# Patient Record
Sex: Female | Born: 1935
Health system: Southern US, Community
[De-identification: ages and names within clinical notes are randomized; demographics above are authoritative.]

## PROBLEM LIST (undated history)

## (undated) DIAGNOSIS — I5033 Acute on chronic diastolic (congestive) heart failure: Secondary | ICD-10-CM

## (undated) DIAGNOSIS — E785 Hyperlipidemia, unspecified: Secondary | ICD-10-CM

## (undated) DIAGNOSIS — M1990 Unspecified osteoarthritis, unspecified site: Secondary | ICD-10-CM

## (undated) DIAGNOSIS — G4700 Insomnia, unspecified: Secondary | ICD-10-CM

## (undated) DIAGNOSIS — I2510 Atherosclerotic heart disease of native coronary artery without angina pectoris: Secondary | ICD-10-CM

## (undated) DIAGNOSIS — I4891 Unspecified atrial fibrillation: Secondary | ICD-10-CM

## (undated) DIAGNOSIS — I10 Essential (primary) hypertension: Secondary | ICD-10-CM

## (undated) DIAGNOSIS — K219 Gastro-esophageal reflux disease without esophagitis: Secondary | ICD-10-CM

## (undated) DIAGNOSIS — K5792 Diverticulitis of intestine, part unspecified, without perforation or abscess without bleeding: Secondary | ICD-10-CM

## (undated) HISTORY — DX: Hyperlipidemia, unspecified: E78.5

## (undated) HISTORY — DX: Insomnia, unspecified: G47.00

## (undated) HISTORY — DX: Atherosclerotic heart disease of native coronary artery without angina pectoris: I25.10

## (undated) HISTORY — PX: ROTATOR CUFF REPAIR: SHX139

## (undated) HISTORY — DX: Diverticulitis of intestine, part unspecified, without perforation or abscess without bleeding: K57.92

## (undated) HISTORY — DX: Acute on chronic diastolic (congestive) heart failure: I50.33

## (undated) HISTORY — PX: JOINT REPLACEMENT: SHX530

---

## 1973-07-27 HISTORY — PX: ABDOMINAL HYSTERECTOMY: SHX81

## 1997-11-16 ENCOUNTER — Encounter: Admission: RE | Admit: 1997-11-16 | Discharge: 1997-11-16 | Payer: Self-pay | Admitting: Family Medicine

## 1997-11-20 ENCOUNTER — Encounter: Admission: RE | Admit: 1997-11-20 | Discharge: 1997-11-20 | Payer: Self-pay | Admitting: Family Medicine

## 1998-10-04 ENCOUNTER — Ambulatory Visit (HOSPITAL_COMMUNITY): Admission: RE | Admit: 1998-10-04 | Discharge: 1998-10-04 | Payer: Self-pay | Admitting: Family Medicine

## 1998-10-04 ENCOUNTER — Encounter: Admission: RE | Admit: 1998-10-04 | Discharge: 1998-10-04 | Payer: Self-pay | Admitting: Family Medicine

## 1998-11-01 ENCOUNTER — Encounter: Admission: RE | Admit: 1998-11-01 | Discharge: 1998-11-01 | Payer: Self-pay | Admitting: Family Medicine

## 1999-04-04 ENCOUNTER — Ambulatory Visit (HOSPITAL_BASED_OUTPATIENT_CLINIC_OR_DEPARTMENT_OTHER): Admission: RE | Admit: 1999-04-04 | Discharge: 1999-04-04 | Payer: Self-pay | Admitting: Orthopedic Surgery

## 1999-04-14 ENCOUNTER — Encounter: Admission: RE | Admit: 1999-04-14 | Discharge: 1999-07-13 | Payer: Self-pay | Admitting: Orthopedic Surgery

## 1999-09-08 ENCOUNTER — Encounter: Admission: RE | Admit: 1999-09-08 | Discharge: 1999-09-08 | Payer: Self-pay | Admitting: Family Medicine

## 1999-09-08 ENCOUNTER — Encounter: Payer: Self-pay | Admitting: Sports Medicine

## 1999-09-08 ENCOUNTER — Encounter: Admission: RE | Admit: 1999-09-08 | Discharge: 1999-09-08 | Payer: Self-pay | Admitting: Sports Medicine

## 1999-09-24 ENCOUNTER — Encounter: Admission: RE | Admit: 1999-09-24 | Discharge: 1999-09-24 | Payer: Self-pay | Admitting: Family Medicine

## 1999-09-26 ENCOUNTER — Encounter: Admission: RE | Admit: 1999-09-26 | Discharge: 1999-09-26 | Payer: Self-pay | Admitting: Family Medicine

## 1999-09-26 ENCOUNTER — Encounter: Payer: Self-pay | Admitting: Family Medicine

## 1999-10-10 ENCOUNTER — Encounter: Admission: RE | Admit: 1999-10-10 | Discharge: 1999-10-10 | Payer: Self-pay | Admitting: Family Medicine

## 2000-01-06 ENCOUNTER — Encounter: Admission: RE | Admit: 2000-01-06 | Discharge: 2000-01-06 | Payer: Self-pay | Admitting: Family Medicine

## 2000-03-03 ENCOUNTER — Encounter: Admission: RE | Admit: 2000-03-03 | Discharge: 2000-03-03 | Payer: Self-pay | Admitting: Family Medicine

## 2000-03-12 ENCOUNTER — Encounter: Admission: RE | Admit: 2000-03-12 | Discharge: 2000-03-12 | Payer: Self-pay | Admitting: Family Medicine

## 2000-03-16 ENCOUNTER — Encounter: Admission: RE | Admit: 2000-03-16 | Discharge: 2000-03-16 | Payer: Self-pay | Admitting: Sports Medicine

## 2000-04-12 ENCOUNTER — Encounter: Admission: RE | Admit: 2000-04-12 | Discharge: 2000-04-12 | Payer: Self-pay | Admitting: Family Medicine

## 2000-04-21 ENCOUNTER — Encounter: Admission: RE | Admit: 2000-04-21 | Discharge: 2000-04-21 | Payer: Self-pay | Admitting: Family Medicine

## 2000-04-21 ENCOUNTER — Encounter: Payer: Self-pay | Admitting: Family Medicine

## 2000-05-03 ENCOUNTER — Encounter: Payer: Self-pay | Admitting: Family Medicine

## 2000-05-03 ENCOUNTER — Ambulatory Visit (HOSPITAL_COMMUNITY): Admission: RE | Admit: 2000-05-03 | Discharge: 2000-05-03 | Payer: Self-pay | Admitting: Family Medicine

## 2000-05-03 ENCOUNTER — Encounter: Admission: RE | Admit: 2000-05-03 | Discharge: 2000-05-03 | Payer: Self-pay | Admitting: Family Medicine

## 2000-05-17 ENCOUNTER — Encounter: Admission: RE | Admit: 2000-05-17 | Discharge: 2000-05-17 | Payer: Self-pay | Admitting: Family Medicine

## 2000-06-20 ENCOUNTER — Emergency Department (HOSPITAL_COMMUNITY): Admission: EM | Admit: 2000-06-20 | Discharge: 2000-06-21 | Payer: Self-pay | Admitting: Emergency Medicine

## 2000-06-21 ENCOUNTER — Encounter: Payer: Self-pay | Admitting: Emergency Medicine

## 2000-06-25 ENCOUNTER — Encounter: Admission: RE | Admit: 2000-06-25 | Discharge: 2000-06-25 | Payer: Self-pay | Admitting: Family Medicine

## 2000-07-05 ENCOUNTER — Encounter: Admission: RE | Admit: 2000-07-05 | Discharge: 2000-07-05 | Payer: Self-pay | Admitting: Family Medicine

## 2000-07-23 ENCOUNTER — Encounter: Admission: RE | Admit: 2000-07-23 | Discharge: 2000-07-23 | Payer: Self-pay | Admitting: Family Medicine

## 2000-08-06 ENCOUNTER — Encounter: Admission: RE | Admit: 2000-08-06 | Discharge: 2000-08-06 | Payer: Self-pay | Admitting: Family Medicine

## 2000-08-20 ENCOUNTER — Encounter: Admission: RE | Admit: 2000-08-20 | Discharge: 2000-08-20 | Payer: Self-pay | Admitting: Family Medicine

## 2000-10-21 ENCOUNTER — Encounter: Admission: RE | Admit: 2000-10-21 | Discharge: 2000-10-21 | Payer: Self-pay | Admitting: Family Medicine

## 2000-10-29 ENCOUNTER — Encounter: Admission: RE | Admit: 2000-10-29 | Discharge: 2000-10-29 | Payer: Self-pay | Admitting: Family Medicine

## 2000-11-26 ENCOUNTER — Encounter: Admission: RE | Admit: 2000-11-26 | Discharge: 2000-11-26 | Payer: Self-pay | Admitting: Family Medicine

## 2000-12-03 ENCOUNTER — Encounter: Admission: RE | Admit: 2000-12-03 | Discharge: 2000-12-03 | Payer: Self-pay | Admitting: Family Medicine

## 2000-12-20 ENCOUNTER — Encounter: Admission: RE | Admit: 2000-12-20 | Discharge: 2000-12-20 | Payer: Self-pay | Admitting: Family Medicine

## 2001-02-28 ENCOUNTER — Encounter: Admission: RE | Admit: 2001-02-28 | Discharge: 2001-02-28 | Payer: Self-pay | Admitting: Family Medicine

## 2001-03-01 ENCOUNTER — Encounter: Payer: Self-pay | Admitting: Family Medicine

## 2001-03-01 ENCOUNTER — Encounter: Admission: RE | Admit: 2001-03-01 | Discharge: 2001-03-01 | Payer: Self-pay | Admitting: Family Medicine

## 2001-03-14 ENCOUNTER — Encounter: Admission: RE | Admit: 2001-03-14 | Discharge: 2001-03-14 | Payer: Self-pay | Admitting: Family Medicine

## 2001-04-01 ENCOUNTER — Encounter: Admission: RE | Admit: 2001-04-01 | Discharge: 2001-04-01 | Payer: Self-pay | Admitting: Family Medicine

## 2001-04-04 ENCOUNTER — Encounter: Payer: Self-pay | Admitting: Sports Medicine

## 2001-04-04 ENCOUNTER — Encounter: Admission: RE | Admit: 2001-04-04 | Discharge: 2001-04-04 | Payer: Self-pay | Admitting: Sports Medicine

## 2001-05-06 ENCOUNTER — Encounter: Admission: RE | Admit: 2001-05-06 | Discharge: 2001-05-06 | Payer: Self-pay | Admitting: Family Medicine

## 2001-05-11 ENCOUNTER — Encounter: Payer: Self-pay | Admitting: Family Medicine

## 2001-05-11 ENCOUNTER — Encounter: Admission: RE | Admit: 2001-05-11 | Discharge: 2001-05-11 | Payer: Self-pay | Admitting: Family Medicine

## 2001-09-23 ENCOUNTER — Encounter: Admission: RE | Admit: 2001-09-23 | Discharge: 2001-09-23 | Payer: Self-pay | Admitting: Family Medicine

## 2001-10-24 ENCOUNTER — Encounter: Admission: RE | Admit: 2001-10-24 | Discharge: 2001-10-24 | Payer: Self-pay | Admitting: Family Medicine

## 2001-10-24 ENCOUNTER — Encounter: Payer: Self-pay | Admitting: Family Medicine

## 2001-11-17 ENCOUNTER — Encounter: Admission: RE | Admit: 2001-11-17 | Discharge: 2001-11-17 | Payer: Self-pay | Admitting: Family Medicine

## 2001-12-05 ENCOUNTER — Encounter: Admission: RE | Admit: 2001-12-05 | Discharge: 2001-12-05 | Payer: Self-pay | Admitting: Family Medicine

## 2002-04-06 ENCOUNTER — Ambulatory Visit (HOSPITAL_BASED_OUTPATIENT_CLINIC_OR_DEPARTMENT_OTHER): Admission: RE | Admit: 2002-04-06 | Discharge: 2002-04-06 | Payer: Self-pay | Admitting: Orthopedic Surgery

## 2002-05-17 ENCOUNTER — Encounter: Admission: RE | Admit: 2002-05-17 | Discharge: 2002-05-17 | Payer: Self-pay | Admitting: Family Medicine

## 2002-07-24 ENCOUNTER — Encounter: Payer: Self-pay | Admitting: Orthopedic Surgery

## 2002-07-28 ENCOUNTER — Encounter: Payer: Self-pay | Admitting: Orthopedic Surgery

## 2002-07-28 ENCOUNTER — Inpatient Hospital Stay (HOSPITAL_COMMUNITY): Admission: RE | Admit: 2002-07-28 | Discharge: 2002-08-03 | Payer: Self-pay | Admitting: Orthopedic Surgery

## 2002-09-15 ENCOUNTER — Encounter: Admission: RE | Admit: 2002-09-15 | Discharge: 2002-09-15 | Payer: Self-pay | Admitting: Family Medicine

## 2002-10-16 ENCOUNTER — Encounter: Admission: RE | Admit: 2002-10-16 | Discharge: 2002-10-16 | Payer: Self-pay | Admitting: Family Medicine

## 2002-11-13 ENCOUNTER — Encounter: Admission: RE | Admit: 2002-11-13 | Discharge: 2002-11-13 | Payer: Self-pay | Admitting: Family Medicine

## 2002-12-02 ENCOUNTER — Encounter: Payer: Self-pay | Admitting: Emergency Medicine

## 2002-12-03 ENCOUNTER — Inpatient Hospital Stay (HOSPITAL_COMMUNITY): Admission: EM | Admit: 2002-12-03 | Discharge: 2002-12-06 | Payer: Self-pay | Admitting: Family Medicine

## 2002-12-04 ENCOUNTER — Encounter (INDEPENDENT_AMBULATORY_CARE_PROVIDER_SITE_OTHER): Payer: Self-pay | Admitting: Cardiology

## 2002-12-08 ENCOUNTER — Encounter: Admission: RE | Admit: 2002-12-08 | Discharge: 2002-12-08 | Payer: Self-pay | Admitting: Family Medicine

## 2002-12-15 ENCOUNTER — Encounter: Admission: RE | Admit: 2002-12-15 | Discharge: 2002-12-15 | Payer: Self-pay | Admitting: Family Medicine

## 2003-01-04 ENCOUNTER — Encounter: Payer: Self-pay | Admitting: Family Medicine

## 2003-01-04 ENCOUNTER — Encounter: Admission: RE | Admit: 2003-01-04 | Discharge: 2003-01-04 | Payer: Self-pay | Admitting: Family Medicine

## 2003-01-12 ENCOUNTER — Encounter: Admission: RE | Admit: 2003-01-12 | Discharge: 2003-01-12 | Payer: Self-pay | Admitting: Family Medicine

## 2003-01-19 ENCOUNTER — Encounter: Admission: RE | Admit: 2003-01-19 | Discharge: 2003-01-19 | Payer: Self-pay | Admitting: Family Medicine

## 2003-01-31 ENCOUNTER — Encounter: Admission: RE | Admit: 2003-01-31 | Discharge: 2003-01-31 | Payer: Self-pay | Admitting: Family Medicine

## 2003-02-05 ENCOUNTER — Encounter: Admission: RE | Admit: 2003-02-05 | Discharge: 2003-02-05 | Payer: Self-pay | Admitting: Family Medicine

## 2003-02-26 ENCOUNTER — Encounter: Admission: RE | Admit: 2003-02-26 | Discharge: 2003-02-26 | Payer: Self-pay | Admitting: Family Medicine

## 2003-03-12 ENCOUNTER — Encounter: Admission: RE | Admit: 2003-03-12 | Discharge: 2003-03-12 | Payer: Self-pay | Admitting: Family Medicine

## 2003-03-14 ENCOUNTER — Encounter: Payer: Self-pay | Admitting: Family Medicine

## 2003-03-14 ENCOUNTER — Encounter: Admission: RE | Admit: 2003-03-14 | Discharge: 2003-03-14 | Payer: Self-pay | Admitting: Family Medicine

## 2003-03-30 ENCOUNTER — Encounter: Payer: Self-pay | Admitting: Emergency Medicine

## 2003-03-30 ENCOUNTER — Inpatient Hospital Stay (HOSPITAL_COMMUNITY): Admission: AD | Admit: 2003-03-30 | Discharge: 2003-04-09 | Payer: Self-pay

## 2003-03-30 ENCOUNTER — Encounter: Payer: Self-pay | Admitting: Cardiology

## 2003-04-04 ENCOUNTER — Encounter: Payer: Self-pay | Admitting: Cardiothoracic Surgery

## 2003-04-05 ENCOUNTER — Encounter: Payer: Self-pay | Admitting: Cardiothoracic Surgery

## 2003-04-06 ENCOUNTER — Encounter: Payer: Self-pay | Admitting: Cardiothoracic Surgery

## 2003-04-07 ENCOUNTER — Encounter: Payer: Self-pay | Admitting: Cardiothoracic Surgery

## 2003-05-03 ENCOUNTER — Encounter: Admission: RE | Admit: 2003-05-03 | Discharge: 2003-05-03 | Payer: Self-pay | Admitting: Cardiothoracic Surgery

## 2003-05-03 ENCOUNTER — Encounter: Payer: Self-pay | Admitting: Cardiothoracic Surgery

## 2003-05-21 ENCOUNTER — Encounter (HOSPITAL_COMMUNITY): Admission: RE | Admit: 2003-05-21 | Discharge: 2003-08-19 | Payer: Self-pay

## 2003-05-22 ENCOUNTER — Encounter: Admission: RE | Admit: 2003-05-22 | Discharge: 2003-05-22 | Payer: Self-pay | Admitting: Sports Medicine

## 2003-06-25 ENCOUNTER — Encounter: Admission: RE | Admit: 2003-06-25 | Discharge: 2003-06-25 | Payer: Self-pay | Admitting: Family Medicine

## 2003-08-06 ENCOUNTER — Encounter: Admission: RE | Admit: 2003-08-06 | Discharge: 2003-08-06 | Payer: Self-pay | Admitting: Sports Medicine

## 2003-08-09 ENCOUNTER — Encounter: Admission: RE | Admit: 2003-08-09 | Discharge: 2003-08-09 | Payer: Self-pay | Admitting: Sports Medicine

## 2003-08-16 ENCOUNTER — Encounter: Admission: RE | Admit: 2003-08-16 | Discharge: 2003-08-16 | Payer: Self-pay | Admitting: Family Medicine

## 2004-06-02 ENCOUNTER — Encounter: Admission: RE | Admit: 2004-06-02 | Discharge: 2004-06-02 | Payer: Self-pay | Admitting: Cardiology

## 2004-07-07 ENCOUNTER — Ambulatory Visit: Payer: Self-pay | Admitting: Family Medicine

## 2004-07-27 HISTORY — PX: CORONARY ARTERY BYPASS GRAFT: SHX141

## 2004-09-10 ENCOUNTER — Ambulatory Visit (HOSPITAL_COMMUNITY): Admission: RE | Admit: 2004-09-10 | Discharge: 2004-09-10 | Payer: Self-pay | Admitting: Cardiology

## 2004-09-15 ENCOUNTER — Ambulatory Visit: Payer: Self-pay | Admitting: Family Medicine

## 2004-10-20 ENCOUNTER — Encounter: Admission: RE | Admit: 2004-10-20 | Discharge: 2004-10-20 | Payer: Self-pay | Admitting: Family Medicine

## 2004-12-29 ENCOUNTER — Ambulatory Visit: Payer: Self-pay | Admitting: Family Medicine

## 2005-02-16 ENCOUNTER — Ambulatory Visit: Payer: Self-pay | Admitting: Family Medicine

## 2005-05-06 ENCOUNTER — Ambulatory Visit: Payer: Self-pay | Admitting: Family Medicine

## 2005-05-07 ENCOUNTER — Ambulatory Visit: Payer: Self-pay | Admitting: Family Medicine

## 2005-05-29 ENCOUNTER — Inpatient Hospital Stay (HOSPITAL_COMMUNITY): Admission: RE | Admit: 2005-05-29 | Discharge: 2005-06-03 | Payer: Self-pay | Admitting: Orthopedic Surgery

## 2005-06-06 ENCOUNTER — Inpatient Hospital Stay (HOSPITAL_COMMUNITY): Admission: EM | Admit: 2005-06-06 | Discharge: 2005-06-10 | Payer: Self-pay | Admitting: Emergency Medicine

## 2005-06-29 ENCOUNTER — Ambulatory Visit: Payer: Self-pay | Admitting: Family Medicine

## 2005-06-29 ENCOUNTER — Inpatient Hospital Stay (HOSPITAL_COMMUNITY): Admission: AD | Admit: 2005-06-29 | Discharge: 2005-07-06 | Payer: Self-pay | Admitting: Orthopedic Surgery

## 2005-08-21 ENCOUNTER — Ambulatory Visit: Payer: Self-pay | Admitting: Family Medicine

## 2005-10-21 ENCOUNTER — Encounter: Admission: RE | Admit: 2005-10-21 | Discharge: 2005-10-21 | Payer: Self-pay | Admitting: Family Medicine

## 2006-01-22 ENCOUNTER — Ambulatory Visit: Payer: Self-pay | Admitting: Family Medicine

## 2006-03-11 ENCOUNTER — Ambulatory Visit: Payer: Self-pay | Admitting: Internal Medicine

## 2006-03-18 ENCOUNTER — Ambulatory Visit: Payer: Self-pay | Admitting: Internal Medicine

## 2006-05-19 ENCOUNTER — Ambulatory Visit: Payer: Self-pay | Admitting: Sports Medicine

## 2006-07-02 ENCOUNTER — Ambulatory Visit: Payer: Self-pay | Admitting: Family Medicine

## 2006-07-16 ENCOUNTER — Ambulatory Visit: Payer: Self-pay | Admitting: Family Medicine

## 2006-07-16 ENCOUNTER — Encounter: Admission: RE | Admit: 2006-07-16 | Discharge: 2006-07-16 | Payer: Self-pay | Admitting: Family Medicine

## 2006-09-23 DIAGNOSIS — K5730 Diverticulosis of large intestine without perforation or abscess without bleeding: Secondary | ICD-10-CM | POA: Insufficient documentation

## 2006-09-23 DIAGNOSIS — K210 Gastro-esophageal reflux disease with esophagitis: Secondary | ICD-10-CM

## 2006-09-23 DIAGNOSIS — K279 Peptic ulcer, site unspecified, unspecified as acute or chronic, without hemorrhage or perforation: Secondary | ICD-10-CM | POA: Insufficient documentation

## 2006-09-23 DIAGNOSIS — K219 Gastro-esophageal reflux disease without esophagitis: Secondary | ICD-10-CM | POA: Diagnosis present

## 2006-09-23 DIAGNOSIS — M545 Low back pain, unspecified: Secondary | ICD-10-CM | POA: Insufficient documentation

## 2006-09-23 DIAGNOSIS — I43 Cardiomyopathy in diseases classified elsewhere: Secondary | ICD-10-CM

## 2006-09-23 DIAGNOSIS — D649 Anemia, unspecified: Secondary | ICD-10-CM | POA: Insufficient documentation

## 2006-09-23 DIAGNOSIS — E785 Hyperlipidemia, unspecified: Secondary | ICD-10-CM

## 2006-09-23 DIAGNOSIS — J309 Allergic rhinitis, unspecified: Secondary | ICD-10-CM | POA: Insufficient documentation

## 2006-09-23 DIAGNOSIS — I119 Hypertensive heart disease without heart failure: Secondary | ICD-10-CM

## 2006-09-23 DIAGNOSIS — G4700 Insomnia, unspecified: Secondary | ICD-10-CM

## 2006-10-26 ENCOUNTER — Encounter: Admission: RE | Admit: 2006-10-26 | Discharge: 2006-10-26 | Payer: Self-pay | Admitting: Family Medicine

## 2006-10-27 ENCOUNTER — Encounter: Payer: Self-pay | Admitting: Family Medicine

## 2007-01-20 ENCOUNTER — Telehealth: Payer: Self-pay | Admitting: *Deleted

## 2007-02-18 ENCOUNTER — Ambulatory Visit: Payer: Self-pay | Admitting: Family Medicine

## 2007-02-18 DIAGNOSIS — R5381 Other malaise: Secondary | ICD-10-CM

## 2007-02-18 DIAGNOSIS — R5383 Other fatigue: Secondary | ICD-10-CM

## 2007-02-18 LAB — CONVERTED CEMR LAB
ALT: 15 units/L (ref 0–35)
AST: 19 units/L (ref 0–37)
Albumin: 4.3 g/dL (ref 3.5–5.2)
Alkaline Phosphatase: 80 units/L (ref 39–117)
BUN: 31 mg/dL — ABNORMAL HIGH (ref 6–23)
CO2: 24 meq/L (ref 19–32)
Calcium: 9 mg/dL (ref 8.4–10.5)
Chloride: 105 meq/L (ref 96–112)
Cholesterol: 181 mg/dL (ref 0–200)
Creatinine, Ser: 1.28 mg/dL — ABNORMAL HIGH (ref 0.40–1.20)
Glucose, Bld: 92 mg/dL (ref 70–99)
HCT: 34.5 % — ABNORMAL LOW (ref 36.0–46.0)
HDL: 41 mg/dL (ref >39)
Hemoglobin: 11 g/dL — ABNORMAL LOW (ref 12.0–15.0)
LDL Cholesterol: 79 mg/dL (ref 0–99)
MCHC: 31.9 g/dL (ref 30.0–36.0)
MCV: 93.5 fL (ref 78.0–100.0)
Potassium: 4.4 meq/L (ref 3.5–5.3)
RBC: 3.69 M/uL — ABNORMAL LOW (ref 3.87–5.11)
Sodium: 141 meq/L (ref 135–145)
Total Bilirubin: 0.3 mg/dL (ref 0.3–1.2)
Total CHOL/HDL Ratio: 4.4
Total Protein: 7 g/dL (ref 6.0–8.3)
Triglycerides: 305 mg/dL — ABNORMAL HIGH (ref <150)
VLDL: 61 mg/dL — ABNORMAL HIGH (ref 0–40)
WBC: 4.1 10*3/uL (ref 4.0–10.5)

## 2007-02-25 ENCOUNTER — Telehealth: Payer: Self-pay | Admitting: Family Medicine

## 2007-03-10 ENCOUNTER — Telehealth: Payer: Self-pay | Admitting: *Deleted

## 2007-03-11 ENCOUNTER — Encounter (INDEPENDENT_AMBULATORY_CARE_PROVIDER_SITE_OTHER): Payer: Self-pay | Admitting: *Deleted

## 2007-05-05 ENCOUNTER — Ambulatory Visit: Payer: Self-pay | Admitting: Family Medicine

## 2007-06-20 ENCOUNTER — Telehealth: Payer: Self-pay | Admitting: Family Medicine

## 2007-07-11 ENCOUNTER — Ambulatory Visit: Payer: Self-pay | Admitting: Family Medicine

## 2007-07-12 ENCOUNTER — Ambulatory Visit: Payer: Self-pay | Admitting: Family Medicine

## 2007-07-12 ENCOUNTER — Encounter: Payer: Self-pay | Admitting: Family Medicine

## 2007-07-12 LAB — CONVERTED CEMR LAB
ALT: 12 units/L (ref 0–35)
AST: 20 units/L (ref 0–37)
Albumin: 4.1 g/dL (ref 3.5–5.2)
Alkaline Phosphatase: 77 units/L (ref 39–117)
BUN: 17 mg/dL (ref 6–23)
Calcium: 9 mg/dL (ref 8.4–10.5)
Chloride: 108 meq/L (ref 96–112)
Creatinine, Ser: 1.17 mg/dL (ref 0.40–1.20)
HCT: 33.1 % — ABNORMAL LOW (ref 36.0–46.0)
Hemoglobin: 10.3 g/dL — ABNORMAL LOW (ref 12.0–15.0)
MCV: 94.3 fL (ref 78.0–100.0)
Platelets: 241 10*3/uL (ref 150–400)
Potassium: 3.7 meq/L (ref 3.5–5.3)
RDW: 14.3 % (ref 11.5–15.5)
TSH: 2.675 microintl units/mL (ref 0.350–5.50)
Total Protein: 6.7 g/dL (ref 6.0–8.3)
WBC: 3.7 10*3/uL — ABNORMAL LOW (ref 4.0–10.5)

## 2007-07-19 ENCOUNTER — Telehealth: Payer: Self-pay | Admitting: Family Medicine

## 2007-07-25 ENCOUNTER — Encounter: Admission: RE | Admit: 2007-07-25 | Discharge: 2007-07-25 | Payer: Self-pay | Admitting: Family Medicine

## 2007-07-25 ENCOUNTER — Ambulatory Visit: Payer: Self-pay | Admitting: Family Medicine

## 2007-07-26 ENCOUNTER — Telehealth: Payer: Self-pay | Admitting: Family Medicine

## 2007-08-12 ENCOUNTER — Ambulatory Visit: Payer: Self-pay | Admitting: Family Medicine

## 2007-10-05 ENCOUNTER — Encounter: Payer: Self-pay | Admitting: Family Medicine

## 2007-10-27 ENCOUNTER — Encounter: Payer: Self-pay | Admitting: Family Medicine

## 2007-12-22 ENCOUNTER — Encounter: Admission: RE | Admit: 2007-12-22 | Discharge: 2007-12-22 | Payer: Self-pay | Admitting: Family Medicine

## 2007-12-30 ENCOUNTER — Ambulatory Visit: Payer: Self-pay | Admitting: Family Medicine

## 2008-02-17 ENCOUNTER — Encounter: Payer: Self-pay | Admitting: Family Medicine

## 2008-02-21 ENCOUNTER — Ambulatory Visit: Payer: Self-pay | Admitting: Family Medicine

## 2008-02-21 ENCOUNTER — Encounter: Payer: Self-pay | Admitting: Family Medicine

## 2008-02-21 LAB — CONVERTED CEMR LAB
HDL: 42 mg/dL (ref >39)
LDL Cholesterol: 115 mg/dL — ABNORMAL HIGH (ref 0–99)
Total CHOL/HDL Ratio: 5
VLDL: 55 mg/dL — ABNORMAL HIGH (ref 0–40)

## 2008-02-22 ENCOUNTER — Telehealth: Payer: Self-pay | Admitting: Family Medicine

## 2008-03-26 ENCOUNTER — Encounter: Payer: Self-pay | Admitting: Family Medicine

## 2008-04-20 ENCOUNTER — Ambulatory Visit: Payer: Self-pay | Admitting: Family Medicine

## 2008-10-03 ENCOUNTER — Ambulatory Visit: Payer: Self-pay | Admitting: Family Medicine

## 2008-10-03 ENCOUNTER — Encounter: Admission: RE | Admit: 2008-10-03 | Discharge: 2008-10-03 | Payer: Self-pay | Admitting: Family Medicine

## 2008-10-03 LAB — CONVERTED CEMR LAB
ALT: 16 units/L (ref 0–35)
AST: 23 units/L (ref 0–37)
Alkaline Phosphatase: 73 units/L (ref 39–117)
BUN: 24 mg/dL — ABNORMAL HIGH (ref 6–23)
Creatinine, Ser: 1.2 mg/dL (ref 0.40–1.20)
HCT: 34.3 % — ABNORMAL LOW (ref 36.0–46.0)
Hemoglobin: 11.2 g/dL — ABNORMAL LOW (ref 12.0–15.0)
MCHC: 32.7 g/dL (ref 30.0–36.0)
Platelets: 217 10*3/uL (ref 150–400)
RDW: 13.5 % (ref 11.5–15.5)

## 2008-11-05 ENCOUNTER — Telehealth: Payer: Self-pay | Admitting: Family Medicine

## 2008-11-05 ENCOUNTER — Telehealth: Payer: Self-pay | Admitting: *Deleted

## 2008-11-28 ENCOUNTER — Encounter: Payer: Self-pay | Admitting: Family Medicine

## 2008-12-26 ENCOUNTER — Encounter: Payer: Self-pay | Admitting: Family Medicine

## 2009-01-10 ENCOUNTER — Encounter: Admission: RE | Admit: 2009-01-10 | Discharge: 2009-01-10 | Payer: Self-pay | Admitting: Family Medicine

## 2009-01-21 ENCOUNTER — Telehealth (INDEPENDENT_AMBULATORY_CARE_PROVIDER_SITE_OTHER): Payer: Self-pay | Admitting: *Deleted

## 2009-01-23 ENCOUNTER — Encounter: Payer: Self-pay | Admitting: Family Medicine

## 2009-02-07 ENCOUNTER — Encounter: Payer: Self-pay | Admitting: Family Medicine

## 2009-02-08 ENCOUNTER — Ambulatory Visit: Payer: Self-pay | Admitting: Family Medicine

## 2009-02-14 ENCOUNTER — Encounter: Admission: RE | Admit: 2009-02-14 | Discharge: 2009-02-14 | Payer: Self-pay | Admitting: Family Medicine

## 2009-02-21 ENCOUNTER — Ambulatory Visit: Payer: Self-pay | Admitting: Family Medicine

## 2009-02-21 ENCOUNTER — Encounter: Payer: Self-pay | Admitting: Family Medicine

## 2009-02-25 LAB — CONVERTED CEMR LAB
AST: 20 units/L (ref 0–37)
Alkaline Phosphatase: 65 units/L (ref 39–117)
BUN: 27 mg/dL — ABNORMAL HIGH (ref 6–23)
Calcium: 9.5 mg/dL (ref 8.4–10.5)
Creatinine, Ser: 1.22 mg/dL — ABNORMAL HIGH (ref 0.40–1.20)
HCT: 34.9 % — ABNORMAL LOW (ref 36.0–46.0)
HDL: 42 mg/dL (ref >39)
Hemoglobin: 11.3 g/dL — ABNORMAL LOW (ref 12.0–15.0)
MCHC: 32.4 g/dL (ref 30.0–36.0)
MCV: 94.6 fL (ref 78.0–100.0)
RDW: 14.4 % (ref 11.5–15.5)
TSH: 4.419 microintl units/mL (ref 0.350–4.500)
Total Bilirubin: 0.4 mg/dL (ref 0.3–1.2)
Total CHOL/HDL Ratio: 4.4
VLDL: 55 mg/dL — ABNORMAL HIGH (ref 0–40)

## 2009-04-08 ENCOUNTER — Telehealth: Payer: Self-pay | Admitting: Family Medicine

## 2009-04-08 ENCOUNTER — Ambulatory Visit: Payer: Self-pay | Admitting: Family Medicine

## 2009-04-08 DIAGNOSIS — R42 Dizziness and giddiness: Secondary | ICD-10-CM | POA: Insufficient documentation

## 2009-05-02 ENCOUNTER — Ambulatory Visit: Payer: Self-pay | Admitting: Family Medicine

## 2009-10-08 ENCOUNTER — Encounter: Payer: Self-pay | Admitting: Family Medicine

## 2009-12-25 ENCOUNTER — Encounter: Payer: Self-pay | Admitting: Family Medicine

## 2010-01-08 ENCOUNTER — Telehealth: Payer: Self-pay | Admitting: Family Medicine

## 2010-01-14 ENCOUNTER — Encounter: Admission: RE | Admit: 2010-01-14 | Discharge: 2010-01-14 | Payer: Self-pay | Admitting: Family Medicine

## 2010-01-23 ENCOUNTER — Encounter: Payer: Self-pay | Admitting: Family Medicine

## 2010-02-12 ENCOUNTER — Ambulatory Visit: Payer: Self-pay | Admitting: Family Medicine

## 2010-02-24 ENCOUNTER — Ambulatory Visit: Payer: Self-pay | Admitting: Family Medicine

## 2010-02-24 LAB — CONVERTED CEMR LAB
AST: 18 units/L (ref 0–37)
Albumin: 4.2 g/dL (ref 3.5–5.2)
Alkaline Phosphatase: 73 units/L (ref 39–117)
BUN: 29 mg/dL — ABNORMAL HIGH (ref 6–23)
Creatinine, Ser: 1.53 mg/dL — ABNORMAL HIGH (ref 0.40–1.20)
HDL: 39 mg/dL — ABNORMAL LOW (ref >39)
Hemoglobin: 10.8 g/dL — ABNORMAL LOW (ref 12.0–15.0)
LDL Cholesterol: 79 mg/dL (ref 0–99)
MCHC: 31.5 g/dL (ref 30.0–36.0)
Potassium: 4.2 meq/L (ref 3.5–5.3)
RDW: 14.4 % (ref 11.5–15.5)
TSH: 3.672 microintl units/mL (ref 0.350–4.500)
Total CHOL/HDL Ratio: 4.3

## 2010-04-08 ENCOUNTER — Encounter: Payer: Self-pay | Admitting: Family Medicine

## 2010-04-22 ENCOUNTER — Telehealth: Payer: Self-pay | Admitting: Family Medicine

## 2010-04-23 ENCOUNTER — Ambulatory Visit: Payer: Self-pay | Admitting: Family Medicine

## 2010-04-23 DIAGNOSIS — L723 Sebaceous cyst: Secondary | ICD-10-CM

## 2010-08-26 NOTE — Miscellaneous (Signed)
Summary: hydrocodone/APAP refill  Clinical Lists Changes  Medications: Changed medication from HYDROCODONE-ACETAMINOPHEN 5-325 MG  TABS (HYDROCODONE-ACETAMINOPHEN) one by mouth three times a day to HYDROCODONE-ACETAMINOPHEN 5-325 MG  TABS (HYDROCODONE-ACETAMINOPHEN) one by mouth three times a day - Signed Rx of HYDROCODONE-ACETAMINOPHEN 5-325 MG  TABS (HYDROCODONE-ACETAMINOPHEN) one by mouth three times a day;  #90 x 5;  Signed;  Entered by: Madison Hickman MD;  Authorized by: Madison Hickman MD;  Method used: Handwritten    Prescriptions: HYDROCODONE-ACETAMINOPHEN 5-325 MG  TABS (HYDROCODONE-ACETAMINOPHEN) one by mouth three times a day  #90 x 5   Entered and Authorized by:   Madison Hickman MD   Signed by:   Madison Hickman MD on 03/26/2008   Method used:   Handwritten   [id number]

## 2010-08-26 NOTE — Assessment & Plan Note (Signed)
Summary: knot on rib/[place]   Vital Signs:  Patient profile:   77 year old female Weight:      153 pounds Temp:     98.4 degrees F oral Pulse rate:   60 / minute Pulse rhythm:   regular BP sitting:   127 / 73  (right arm) Cuff size:   regular  Vitals Entered By: Audelia Hives CMA (April 23, 2010 2:33 PM) CC: knot under    Primary Care Provider:  Madison Hickman MD  CC:  knot under .  History of Present Illness: Painful red swelling just under brastrap Right lateral thorax  Habits & Providers  Alcohol-Tobacco-Diet     Alcohol drinks/day: <1     Tobacco Status: never  Current Medications (verified): 1)  Alprazolam 0.5 Mg Tabs (Alprazolam) .... Take 1 Tablet By Mouth Three Times A Day 2)  Bayer Childrens Aspirin 81 Mg Chew (Aspirin) .... Take 1 Tablet By Mouth Once A Day 3)  Hydrochlorothiazide 12.5 Mg Tabs (Hydrochlorothiazide) .... Take 1 Tablet By Mouth Once A Day 4)  Metoprolol Tartrate 25 Mg Tabs (Metoprolol Tartrate) .... Take 1 Tablet By Mouth Twice A Day 5)  Omeprazole 20 Mg Cpdr (Omeprazole) .... Take 1 Capsule By Mouth Once A Day 6)  Sm Calcium/vitamin D 500-200 Mg-Unit Tabs (Calcium Carbonate-Vitamin D) .... Take 1 Tablet By Mouth Three Times A Day 7)  Enalapril Maleate 10 Mg  Tabs (Enalapril Maleate) .... One Tab Twice Daily 8)  Simvastatin 80 Mg Tabs (Simvastatin) .Marland Kitchen.. 1 By Mouth At Bedtime 9)  Requip 0.25 Mg Tabs (Ropinirole Hcl) .... One By Mouth Three Times A Day 10)  Ibuprofen 600 Mg  Tabs (Ibuprofen) .... One By Mouth Three Times A Day 11)  Fish Oil 1000 Mg Caps (Omega-3 Fatty Acids) .... One By Mouth 4 Times Daily Per Cards 12)  Loratadine 10 Mg  Tabs (Loratadine) .... Once Daily As Needed Allergies 13)  Doxycycline Hyclate 100 Mg Caps (Doxycycline Hyclate) .... Take 1 Tab Twice A Day  Allergies (verified): 1)  Lipitor (Atorvastatin Calcium)  Past History:  Past medical, surgical, family and social histories (including risk factors) reviewed,  and no changes noted (except as noted below).  Past Medical History: Reviewed history from 09/23/2006 and no changes required. dry eyes seeing opthalmologist, gynodiol 1 mg bid per Dr. Ree Edman, stool softener  Past Surgical History: Reviewed history from 12/30/2007 and no changes required. bone density 04/99 T=-0.80 -, CABG - 04/11/2003, Cardiac Cath - 12/08/2002, Echo with nl EF - 12/08/2002, hemorhoidectomy -, Hysterectomy -, Lt total knee replacement - 07/27/2002, nl gated stress test - 05/27/2006, PCTA - 12/08/2002, Rt rotator cuff surg -, Stress perfusion EF77%, - 01/13/2006  Lt rotator cuff surg April 09  Family History: Reviewed history from 09/23/2006 and no changes required. , -Ca, HBP, DM, CAD  Social History: Reviewed history from 02/12/2010 and no changes required. non smoker; non drinker; husband: died 22-Oct-2022;  Physical Exam  Skin:  Infected sebaceous cyst.  I was able to open and gently express purulent sebum.   Impression & Recommendations:  Problem # 1:  SEBACEOUS CYST, INFECTED (ICD-706.2)  Draining now.  Use warm compresses.  Short course of doxy.  Orders: [facility]- Est Level  3 [id number])  Complete Medication List: 1)  Alprazolam 0.5 Mg Tabs (Alprazolam) .... Take 1 tablet by mouth three times a day 2)  Bayer Childrens Aspirin 81 Mg Chew (Aspirin) .... Take 1 tablet by mouth once a day 3)  Hydrochlorothiazide 12.5 Mg  Tabs (Hydrochlorothiazide) .... Take 1 tablet by mouth once a day 4)  Metoprolol Tartrate 25 Mg Tabs (Metoprolol tartrate) .... Take 1 tablet by mouth twice a day 5)  Omeprazole 20 Mg Cpdr (Omeprazole) .... Take 1 capsule by mouth once a day 6)  Sm Calcium/vitamin D 500-200 Mg-unit Tabs (Calcium carbonate-vitamin d) .... Take 1 tablet by mouth three times a day 7)  Enalapril Maleate 10 Mg Tabs (Enalapril maleate) .... One tab twice daily 8)  Simvastatin 80 Mg Tabs (Simvastatin) .Marland Kitchen.. 1 by mouth at bedtime 9)  Requip 0.25 Mg Tabs (Ropinirole hcl) .... One by mouth  three times a day 10)  Ibuprofen 600 Mg Tabs (Ibuprofen) .... One by mouth three times a day 11)  Fish Oil 1000 Mg Caps (Omega-3 fatty acids) .... One by mouth 4 times daily per cards 12)  Loratadine 10 Mg Tabs (Loratadine) .... Once daily as needed allergies 13)  Doxycycline Hyclate 100 Mg Caps (Doxycycline hyclate) .... Take 1 tab twice a day  Other Orders: Influenza Vaccine MCR [id number]) Prescriptions: DOXYCYCLINE HYCLATE 100 MG CAPS (DOXYCYCLINE HYCLATE) Take 1 tab twice a day  #10 x 0   Entered and Authorized by:   Madison Hickman MD   Signed by:   Madison Hickman MD on 04/23/2010   Method used:   [facility] [id number]       [address]       [place], [place]  [address]       Ph: [phone]       Fax: [phone]   RxID:   [id number] LORATADINE 10 MG  TABS (LORATADINE) once daily as needed allergies  #30 x 12   Entered and Authorized by:   Madison Hickman MD   Signed by:   Madison Hickman MD on 04/23/2010   Method used:   [facility]        [facility] [id number]       [address]       [place], [place]   [address]       Ph: [phone]       Fax: [phone]   RxID:   [id number]    Immunizations Administered:  Influenza Vaccine # 1:    Vaccine Type: Fluvax MCR    Site: right deltoid    Mfr: GlaxoSmithKline    Dose: 0.5 ml    Route: IM    Given by: Audelia Hives CMA    Exp. Date: 01/21/2011    Lot #: [id number]    VIS given: 02/18/10 version given April 23, 2010.  Flu Vaccine Consent Questions:    Do you have a history of severe allergic reactions to this vaccine? no    Any prior history of allergic reactions to egg and/or gelatin? no    Do you have a sensitivity to the preservative Thimersol? no    Do you have a past history of Guillan-Barre Syndrome? no    Do you currently have an acute febrile illness? no    Have you ever had a severe reaction  to latex? no    Vaccine information given and explained to patient? yes    Are you currently pregnant? no    Prevention & Chronic Care Immunizations   Influenza vaccine: Fluvax MCR  (04/23/2010)   Influenza vaccine due: 03/28/2011  Tetanus booster: 12/30/2007: given   Tetanus booster due: 12/29/2017    Pneumococcal vaccine: Done.  (10/25/1997)   Pneumococcal vaccine deferral: Not indicated  (02/12/2010)   Pneumococcal vaccine due: None    H. zoster vaccine: Not documented  Colorectal Screening   Hemoccult: Done.  (07/28/2003)   Hemoccult due: Not Indicated    Colonoscopy: Done.  (02/24/2006)   Colonoscopy due: 02/25/2016  Other Screening   Pap smear: Not documented   Pap smear due: Not Indicated    Mammogram: ASSESSMENT: Negative - BI-RADS 1^MM DIGITAL SCREENING  (01/14/2010)   Mammogram due: 01/15/2011    DXA bone density scan: Lumbar Spine:  T Score > -1.0 Spine.  Hip Total: T Score -2.5 to -1.0 Hip.      (02/13/2009)   DXA scan due: None    Smoking status: never  (04/23/2010)  Lipids   Total Cholesterol: 166  (02/24/2010)   Lipid panel action/deferral: Lipid Panel ordered   LDL: 79  (02/24/2010)   LDL Direct: Not documented   HDL: 39  (02/24/2010)   Triglycerides: 239  (02/24/2010)    SGOT (AST): 18  (02/24/2010)   SGPT (ALT): 14  (02/24/2010)   Alkaline phosphatase: 73  (02/24/2010)   Total bilirubin: 0.4  (02/24/2010)    Lipid flowsheet reviewed?: Yes   Progress toward LDL goal: At goal  Hypertension   Last Blood Pressure: 127 / 73  (04/23/2010)   Serum creatinine: 1.53  (02/24/2010)   Serum potassium 4.2  (02/24/2010)    Hypertension flowsheet reviewed?: Yes   Progress toward BP goal: At goal  Self-Management Support :   Personal Goals (by the next clinic visit) :      Personal blood pressure goal: 140/90  (02/12/2010)     Personal LDL goal: 100  (02/12/2010)    Hypertension self-management support: Written self-care plan   (02/12/2010)    Lipid self-management support: Written self-care plan  (02/12/2010)

## 2010-08-26 NOTE — Progress Notes (Signed)
Summary: triage  Phone Note Call from Patient Call back at Home Phone [phone]   Caller: Patient Summary of Call: Found a knot under breast like on a rib.  Would like to have it looked at today. Initial call taken by: Raymond Gurney,  April 22, 2010 9:22 AM  Follow-up for Phone Call        states she found it this am. her rib had been sore previously. knot is new. it is sore. she is quite worried. will see pcp tomorrow at 2:15 Follow-up by: Elige Radon RN,  April 22, 2010 9:26 AM  Additional Follow-up for Phone Call Additional follow up Details #1::        noted and agree Additional Follow-up by: Madison Hickman MD,  April 22, 2010 9:31 AM

## 2010-08-26 NOTE — Miscellaneous (Signed)
Summary: Alprazolam refill  Clinical Lists Changes Refilled via fax request by pharmacy Medications: Changed medication from ALPRAZOLAM 0.5 MG TABS (ALPRAZOLAM) Take 1 tablet by mouth three times a day to ALPRAZOLAM 0.5 MG TABS (ALPRAZOLAM) Take 1 tablet by mouth three times a day - Signed Rx of ALPRAZOLAM 0.5 MG TABS (ALPRAZOLAM) Take 1 tablet by mouth three times a day;  #90 x 5;  Signed;  Entered by: Madison Hickman MD;  Authorized by: Madison Hickman MD;  Method used: Handwritten    Prescriptions: ALPRAZOLAM 0.5 MG TABS (ALPRAZOLAM) Take 1 tablet by mouth three times a day  #90 x 5   Entered and Authorized by:   Madison Hickman MD   Signed by:   Madison Hickman MD on 10/08/2009   Method used:   Handwritten   RxID:   [id number]

## 2010-08-26 NOTE — Miscellaneous (Signed)
Summary: med refill by fax request  Clinical Lists Changes  Medications: Rx of ALPRAZOLAM 0.5 MG TABS (ALPRAZOLAM) Take 1 tablet by mouth three times a day;  #90 x 5;  Signed;  Entered by: Madison Hickman MD;  Authorized by: Madison Hickman MD;  Method used: Handwritten    Prescriptions: ALPRAZOLAM 0.5 MG TABS (ALPRAZOLAM) Take 1 tablet by mouth three times a day  #90 x 5   Entered and Authorized by:   Madison Hickman MD   Signed by:   Madison Hickman MD on 04/08/2010   Method used:   Handwritten   RxID:   [id number]

## 2010-08-26 NOTE — Assessment & Plan Note (Signed)
Summary: cpe,df   Vital Signs:  Patient profile:   77 year old female Height:      62 inches Weight:      155.7 pounds BMI:     28.58 Temp:     98.2 degrees F Pulse rate:   66 / minute BP sitting:   150 / 82  Primary Care Provider:  Madison Hickman MD  CC:  FU mult problems.  History of Present Illness: C/O insomnia Not taking hydrocodone, prefers OTC ibuprofen.  Discussed ways to decrease risk of side effects.   Discussed 80 mg simvastatin dose.  No myalgias.  Tolerating well . Will leave her on same dose. BP high today.  Was also high at cardiologist At 24, not much screening indicated  Habits & Providers  Alcohol-Tobacco-Diet     Alcohol drinks/day: <1     Tobacco Status: never  Exercise-Depression-Behavior     Have you felt down or hopeless? no     Have you felt little pleasure in things? no     STD Risk: never  Current Medications (verified): 1)  Alprazolam 0.5 Mg Tabs (Alprazolam) .... Take 1 Tablet By Mouth Three Times A Day 2)  Bayer Childrens Aspirin 81 Mg Chew (Aspirin) .... Take 1 Tablet By Mouth Once A Day 3)  Hydrochlorothiazide 12.5 Mg Tabs (Hydrochlorothiazide) .... Take 1 Tablet By Mouth Once A Day 4)  Metoprolol Tartrate 25 Mg Tabs (Metoprolol Tartrate) .... Take 1 Tablet By Mouth Twice A Day 5)  Omeprazole 20 Mg Cpdr (Omeprazole) .... Take 1 Capsule By Mouth Once A Day 6)  Sm Calcium/vitamin D 500-200 Mg-Unit Tabs (Calcium Carbonate-Vitamin D) .... Take 1 Tablet By Mouth Three Times A Day 7)  Enalapril Maleate 10 Mg  Tabs (Enalapril Maleate) .... One Tab Twice Daily 8)  Simvastatin 80 Mg Tabs (Simvastatin) .Marland Kitchen.. 1 By Mouth At Bedtime 9)  Requip 0.25 Mg Tabs (Ropinirole Hcl) .... One By Mouth Three Times A Day 10)  Ibuprofen 600 Mg  Tabs (Ibuprofen) .... One By Mouth Three Times A Day 11)  Fish Oil 1000 Mg Caps (Omega-3 Fatty Acids) .... One By Mouth 4 Times Daily Per Cards  Allergies (verified): 1)  Lipitor (Atorvastatin Calcium)  Past  History:  Past medical, surgical, family and social histories (including risk factors) reviewed, and no changes noted (except as noted below).  Past Medical History: Reviewed history from 09/23/2006 and no changes required. dry eyes seeing opthalmologist, gynodiol 1 mg bid per Dr. Ree Edman, stool softener  Past Surgical History: Reviewed history from 12/30/2007 and no changes required. bone density 04/99 T=-0.80 -, CABG - 04/11/2003, Cardiac Cath - 12/08/2002, Echo with nl EF - 12/08/2002, hemorhoidectomy -, Hysterectomy -, Lt total knee replacement - 07/27/2002, nl gated stress test - 05/27/2006, PCTA - 12/08/2002, Rt rotator cuff surg -, Stress perfusion EF77%, - 01/13/2006  Lt rotator cuff surg April 09  Family History: Reviewed history from 09/23/2006 and no changes required. , -Ca, HBP, DM, CAD  Social History: Reviewed history from 09/23/2006 and no changes required. non smoker; non drinker; husband: died 3/04;STD Risk:  never  Review of Systems  The patient denies anorexia, fever, hoarseness, chest pain, syncope, dyspnea on exertion, peripheral edema, abdominal pain, depression, unusual weight change, and abnormal bleeding.    Physical Exam  General:  Well-developed,well-nourished,in no acute distress; alert,appropriate and cooperative throughout examination Eyes:  No corneal or conjunctival inflammation noted. EOMI. Perrla. Funduscopic exam benign, without hemorrhages, exudates or papilledema. Vision grossly normal. Mouth:  Oral  mucosa and oropharynx without lesions or exudates.  Teeth in good repair. Neck:  No deformities, masses, or tenderness noted. Lungs:  Normal respiratory effort, chest expands symmetrically. Lungs are clear to auscultation, no crackles or wheezes. Heart:  Normal rate and regular rhythm. S1 and S2 normal without gallop, murmur, click, rub or other extra sounds. Abdomen:  Bowel sounds positive,abdomen soft and non-tender without masses, organomegaly or hernias  noted. Extremities:  No clubbing, cyanosis, edema, or deformity noted with normal full range of motion of all joints.   Neurologic:  No cranial nerve deficits noted. Station and gait are normal. Plantar reflexes are down-going bilaterally. DTRs are symmetrical throughout. Sensory, motor and coordinative functions appear intact.   Impression & Recommendations:  Problem # 1:  HYPERTENSION, BENIGN SYSTEMIC (ICD-401.1) Assessment Deteriorated  increased metoprolol Her updated medication list for this problem includes:    Hydrochlorothiazide 12.5 Mg Tabs (Hydrochlorothiazide) .Marland Kitchen... Take 1 tablet by mouth once a day    Metoprolol Tartrate 25 Mg Tabs (Metoprolol tartrate) .Marland Kitchen... Take 1 tablet by mouth twice a day    Enalapril Maleate 10 Mg Tabs (Enalapril maleate) ..... One tab twice daily  Orders: [facility]- Est  Level 4 (99214)Future Orders: Comp Met-[facility] [id number]) ... 01/29/2011 [facility] [id number]) ... 01/30/2011  BP today: 150/82 Prior BP: 122/70 (04/08/2009)  Labs Reviewed: K+: 4.6 (02/21/2009) Creat: : 1.22 (02/21/2009)   Chol: 183 (02/21/2009)   HDL: 42 (02/21/2009)   LDL: 86 (02/21/2009)   TG: 275 (02/21/2009)  Problem # 2:  INSOMNIA NOS (ICD-780.52)  no changes.  Discussed sleep hygeine.  Orders: [facility]- Est  Level 4 (99214)  Problem # 3:  HYPERCHOLESTEROLEMIA (ICD-272.0)  Her updated medication list for this problem includes:    Simvastatin 80 Mg Tabs (Simvastatin) .Marland Kitchen... 1 by mouth at bedtime  Orders: [facility]- Est  Level 4 (99214)Future Orders: T-Lipid Profile [id number]) ... 01/29/2011  Labs Reviewed: SGOT: 20 (02/21/2009)   SGPT: 13 (02/21/2009)   HDL:42 (02/21/2009), 42 (02/21/2008)  LDL:86 (02/21/2009), 115 (02/21/2008)  Chol:183 (02/21/2009), 212 (02/21/2008)  Trig:275 (02/21/2009), 276 (02/21/2008)  Problem # 4:  ANEMIA, OTHER, UNSPECIFIED (ICD-285.9) Recheck labs. Orders: [facility]- Est  Level 4 (99214)Future Orders: [facility] [id number]) ... 01/28/2011  Complete  Medication List: 1)  Alprazolam 0.5 Mg Tabs (Alprazolam) .... Take 1 tablet by mouth three times a day 2)  Bayer Childrens Aspirin 81 Mg Chew (Aspirin) .... Take 1 tablet by mouth once a day 3)  Hydrochlorothiazide 12.5 Mg Tabs (Hydrochlorothiazide) .... Take 1 tablet by mouth once a day 4)  Metoprolol Tartrate 25 Mg Tabs (Metoprolol tartrate) .... Take 1 tablet by mouth twice a day 5)  Omeprazole 20 Mg Cpdr (Omeprazole) .... Take 1 capsule by mouth once a day 6)  Sm Calcium/vitamin D 500-200 Mg-unit Tabs (Calcium carbonate-vitamin d) .... Take 1 tablet by mouth three times a day 7)  Enalapril Maleate 10 Mg Tabs (Enalapril maleate) .... One tab twice daily 8)  Simvastatin 80 Mg Tabs (Simvastatin) .Marland Kitchen.. 1 by mouth at bedtime 9)  Requip 0.25 Mg Tabs (Ropinirole hcl) .... One by mouth three times a day 10)  Ibuprofen 600 Mg Tabs (Ibuprofen) .... One by mouth three times a day 11)  Fish Oil 1000 Mg Caps (Omega-3 fatty acids) .... One by mouth 4 times daily per cards  Patient Instructions: 1)  You look good. 2)  Come in for blood work anytime after 02/21/10.  I'll call with resuts. 3)  Only medicine change is to take a whole metoprolol twice a  day 4)  Check your BP at home to make sure OK Prescriptions: METOPROLOL TARTRATE 25 MG TABS (METOPROLOL TARTRATE) Take 1 tablet by mouth twice a day  #60 x 12   Entered and Authorized by:   Madison Hickman MD   Signed by:   Madison Hickman MD on 02/12/2010   Method used:   Electronically to        [id number]* (retail)       [address]       [place], [place]  [address]       Ph: [phone]       Fax: [phone]   RxID:   [id number]    Prevention & Chronic Care Immunizations   Influenza vaccine: Fluvax MCR  (05/02/2009)   Influenza vaccine due: 05/16/2009    Tetanus booster: 12/30/2007: given   Tetanus booster due: 12/29/2017    Pneumococcal vaccine: Done.  (10/25/1997)   Pneumococcal vaccine  deferral: Not indicated  (02/12/2010)   Pneumococcal vaccine due: None    H. zoster vaccine: Not documented  Colorectal Screening   Hemoccult: Done.  (07/28/2003)   Hemoccult due: Not Indicated    Colonoscopy: Done.  (02/24/2006)   Colonoscopy due: 02/25/2016  Other Screening   Pap smear: Not documented   Pap smear due: Not Indicated    Mammogram: ASSESSMENT: Negative - BI-RADS 1^MM DIGITAL SCREENING  (01/14/2010)   Mammogram due: 01/15/2011    DXA bone density scan: Lumbar Spine:  T Score > -1.0 Spine.  Hip Total: T Score -2.5 to -1.0 Hip.      (02/13/2009)   DXA scan due: None    Smoking status: never  (02/12/2010)  Lipids   Total Cholesterol: 183  (02/21/2009)   Lipid panel action/deferral: Lipid Panel ordered   LDL: 86  (02/21/2009)   LDL Direct: Not documented   HDL: 42  (02/21/2009)   Triglycerides: 275  (02/21/2009)    SGOT (AST): 20  (02/21/2009)   SGPT (ALT): 13  (02/21/2009) CMP ordered    Alkaline phosphatase: 65  (02/21/2009)   Total bilirubin: 0.4  (02/21/2009)    Lipid flowsheet reviewed?: Yes   Progress toward LDL goal: At goal  Hypertension   Last Blood Pressure: 150 / 82  (02/12/2010)   Serum creatinine: 1.22  (02/21/2009)   Serum potassium 4.6  (02/21/2009) CMP ordered     Hypertension flowsheet reviewed?: Yes   Progress toward BP goal: Deteriorated  Self-Management Support :   Personal Goals (by the next clinic visit) :      Personal blood pressure goal: 140/90  (02/12/2010)     Personal LDL goal: 100  (02/12/2010)    Hypertension self-management support: Written self-care plan  (02/12/2010)   Hypertension self-care plan printed.    Lipid self-management support: Written self-care plan  (02/12/2010)   Lipid self-care plan printed.

## 2010-08-26 NOTE — Letter (Signed)
Summary: Wellness Visit Letter  [facility]  [address]   [place], [place] [address]   Phone: [phone]  Fax: [phone]    12/25/2009  [address] [place], [place]  [address]  Dear Ms. [place],  We are happy to let you know that since you are covered under Medicare you are able to have a FREE visit at the [facility] to discuss your HEALTH. This is a new benefit for Medicare.  There will be no co-payment.  At this visit you will meet with Tereasa Coop an expert in wellness and the nurse practitioner at our clinic.  At this visit we will discuss ways to keep you healthy and feeling well.  This visit will not replace your regular doctor visit and we cannot refill medications.  We may schedule future blood work, give shots if needed, or schedule tests to look for hidden problems.   You will need to plan to be here at least one hour to talk about your medical history, your current status, review all of your medications, and discuss your future plans for your health.  This information will be entered into your record for your doctor to have and review.  If you are interested in staying healthy, this type of visit can help.  Please call the office at: [phone], to schedule a "Medicare Wellness Visit".  The day of the visit you should bring in all of your medications, including any vitamins, herbs, over the counter products you take.  Make a list of all the other doctors that you see, so we know who they are. If you have any other health documents please bring them.  We look forward to helping you stay healthy.     Sincerely,   Tereasa Coop NP  Appended Document: Wellness Visit Letter mailed.

## 2010-08-26 NOTE — Consult Note (Signed)
Summary: [facility]  [facility]   Imported By: Beryle Lathe 02/06/2010 11:47:05  _____________________________________________________________________  External Attachment:    Type:   Image     Comment:   External Document

## 2010-08-26 NOTE — Progress Notes (Signed)
Summary: labs  Phone Note Call from Patient Call back at Home Phone [phone]   Caller: Patient Summary of Call: pt is wanting to come in before her cpe appt on 7/20 for labs.  not sure what kind of labs need to be done. Initial call taken by: Audie Clear,  January 08, 2010 10:36 AM  Follow-up for Phone Call        Called and left message.  Since she had last lipid panel done 02/21/09 will wait a full year before reordering.  Advised to keep appointment for 7/20 and we will get labs afterward. Follow-up by: Madison Hickman MD,  January 08, 2010 11:24 AM

## 2010-10-07 ENCOUNTER — Encounter: Payer: Self-pay | Admitting: Home Health Services

## 2010-12-02 ENCOUNTER — Telehealth: Payer: Self-pay | Admitting: Family Medicine

## 2010-12-02 NOTE — Telephone Encounter (Signed)
Jacqueline [name] returning your call regarding some information received from her pharmacy.  She's not happy with what she was told and wishes to speak with you directly about it.  Please call her back at your earliest convenience.

## 2010-12-02 NOTE — Telephone Encounter (Signed)
I had received a fax yesterday requesting refill of hydrocodone.  This was not on the patient list so I had called her.  She had not requested refill.  I faxed back to pharmacy, denying the refill, stating patient did not order and asking them to investigate to be certain no one in their shop was diverting narcotics.  Patient also called and adamantly stated she did not order this med and wants it taken off her list.

## 2010-12-12 NOTE — Op Note (Signed)
NAME:  KEYATTA, WATERHOUSE                 ACCOUNT NO.:  [id number]   MEDICAL RECORD NO.:  [id number]          PATIENT TYPE:  INP   LOCATION:  0001                         FACILITY:  [facility]   PHYSICIAN:  Doran Heater. Veverly Fells, M.D. DATE OF BIRTH:  1930-10-17   DATE OF PROCEDURE:  05/29/2005  DATE OF DISCHARGE:                                 OPERATIVE REPORT   PREOPERATIVE DIAGNOSES:  Right knee end-stage osteoarthritis.   POSTOPERATIVE DIAGNOSES:  Right knee end-stage osteoarthritis.   PROCEDURE:  Right total knee replacement using DePuy Sigma rotating platform  prosthesis with a size 3 tibia.   COMPONENTS:  Size 3 femoral component, 3 x 12.5 poly and 35 mm patella.   SURGEON:  Doran Heater. Veverly Fells, M.D.   ASSISTANT:  Merla Riches, P.A.-C.   ANESTHESIA:  General plus femoral block anesthesia was used.   ESTIMATED BLOOD LOSS:  Minimal.   TOURNIQUET TIME:  1 hour and 40 minutes.   INSTRUMENT COUNT:  Correct.   COMPLICATIONS:  There were no complications.   ANTIBIOTICS:  Perioperative antibiotics were given.   INDICATIONS FOR PROCEDURE:  The patient is a 77 year old female with a  history of severe right knee arthritis. She has had conservative management  extensively consisting of activity modification therapy and some  inflammatory medications. She presents now with end-stage arthritis with  debilitating pain desiring operative arthroplasty to restore function and  eliminate pain. Informed consent was obtained.   DESCRIPTION OF PROCEDURE:  After an adequate level of anesthesia was  achieved, the patient positioned supine on the operating table, a nonsterile  tourniquet was placed on the right proximal thigh, the right leg was  sterilely prepped and draped in its usual fashion. We exsanguinated the limb  by elevation and exsanguination using an Esmarch bandage. We then elevated  the tourniquet to 350 mmHg. A longitudinal skin incision was created with  the knee in flexion, dissection  carried sharply down through the  subcutaneous tissues. The patella retinaculum was identified in the medial  parapatellar arthrotomy which was extended up onto the vastus medialis  obliquus muscle rather than quad tendon was performed. This allowed [place] to  ever the patella and flip the patella and flex the knee. We then removed the  ACL and PCL, identified the intercondylar notch area and then used the step  cut drill to enter the distal femur. We then used an intramedullary guide to  cut initially 10 mm off the distal femur. This did not seem to be adequate  resection off of the femoral condyle thus an additional 2 mm was taken. This  provided a good resection of the distal femur. At this point, we incised the  femur down onto the anterior cortex, this was a size 3. I performed the  anterior and posterior cuts as well as the chamfer cuts off the 4 in 1  guide. Once we had done this, we placed our attention towards the tibia, we  subluxed the tibia removing the rest of the PCL and then removed the  remainder of the menisci. We had good exposure to the  tibia as it was  subluxed and then performed a tibial cut, this was a 90 degree cut with the  external alignment jig with care taken towards taking 4 mm off the affected  side which was the lateral side, appeared to be lowest. Attention towards  making sure we had a perpendicular cut down the long axis of the tibia. Once  we performed this cut, we used sizing blocks to assess our gaps both in  flexion and extension, these were symmetrically tight thus we resected  another 10 mm off the tibia giving [place] nice spaces both in flexion extension.  At this point, we prepared the tibia and sized it. We began with a size 3.  We placed the jig on the proximal tibia for drilling out the keel which we  did utilizing the sigma rotating platform instruments. This allowed for the  rotating platform well as well as the Keel wings. Once this was punched, we   left the tibial component trial component in and then we placed our  attention towards the femur. We used the post stabilized box cutting jig  which was placed onto the distal femur and then used to cut out the bone for  the fox. We then placed the trial components in place with the 10 insert,  reduced the knee and felt the knee was stable and could probably tolerate a  12.5 but we are happy with a 10 at this point. We then performed a free hand  resection of the patella which was started with 20 mm of thickness. We  resected down to 13 mm to allow for an 8.5 component with a 35 mm diameter  patellar button. We drilled the holes for this thoroughly irrigating the  wound of all bony debris. Next, we inspected the posterior aspect of the  knee, no osteophytes were noted. We again looked for any other bony debris  about the knee, we are happy after the thorough irrigation and preceded with  cementing the real components in. We used the size 3 DePuy Sigma rotating  platform femoral components, size 3 tibial component as well as a 35 mm  patellar button. Once we had vacuum mixed the cement, this was Smart Set by  DePuy, we cemented the tibial component and femoral component into place.  The cement set up extra quick on the cement, this was vacuum mixed in a gun.  There was some amount of difficulty getting the components fully impacted  into place requiring quite firm strikes with the mallet, but we were able to  get the components down. We placed a 10 insert in and placed the knee in  extension, applied axial loads to the end of the foot. We also cemented the  patellar component in just as cement was setting up. Right as we got the  patellar component, the cement did set up and we had to wait for it fully  harden before we could use an osteotome to remove the excess cement around  the patellar component. Once the cement was allowed to harden, we removed excess cement. I did obtain x-rays to make  sure that we had not had any  small fractures during the vigorous impacting needed to get the components  down. We did feel like the components were fully down and in very good  position based on our x-rays. At this point, we exchanged the trial tibial  component for a 12.5 trial component. Taking it through a full range of  motion we noted full extension and in fact a little hyperextension as well  as nice stability in extension and in flexion. No instability was noted. We  removed the trial component, inspected one more time for any excess cement  or loose bone fragments. We thoroughly irrigated with pulse lavage and then  placed the real 12.5 component in, again was happy with patella tracking  which was perfect and medial and lateral stability both in extension and  flexion. We then placed a drain deep to fascia and repaired the medial  parapatellar arthrotomy utilizing interrupted #1 PDS suture followed by #0  Vicryl, 2-0 Vicryl and running 4-0 Monocryl for skin. Steri-Strips were  applied, followed by a sterile dressing. The patient tolerated the surgery  well and was taken to the recovery room in stable condition.           ______________________________  Doran Heater Veverly Fells, M.D.     SRN/MEDQ  D:  05/29/2005  T:  05/29/2005  Job:  [id number]

## 2010-12-12 NOTE — Discharge Summary (Signed)
NAME:  Jacqueline [name], Jacqueline [name]                 ACCOUNT NO.:  [id number]   MEDICAL RECORD NO.:  [id number]          PATIENT TYPE:  INP   LOCATION:  5036                         FACILITY:  [facility]   PHYSICIAN:  Doran Heater. Veverly Fells, M.D. DATE OF BIRTH:  25-Oct-1930   DATE OF ADMISSION:  06/29/2005  DATE OF DISCHARGE:  07/06/2005                                 DISCHARGE SUMMARY   ADMISSION DIAGNOSES:  1.  Right knee pain.  2.  Hypokalemia.  3.  Anemia.  4.  Hypercoagulability.   DISCHARGE DIAGNOSES:  1.  Right knee pain, status post closed manipulation, improved.  2.  Hypokalemia, resolved.  3.  Anemia, improved.  4.  Hypercoagulopathy, improved.   PROCEDURE:  Patient had a closed manipulation of her right total knee  replacement on July 04, 2005.  Attending surgeon was Esmond Plants, M.D.,  no assistant.  No blood loss, and no complications.   HOSPITAL COURSE:  The patient was admitted on June 29, 2005 for  increasing right knee pain and feeling weak.  Upon initial labs, she was  noted to have an INR of 11, a potassium of 2.7, and an H&H of 9 and 27  respectively.  The patient had a medical consult through [facility]  [facility], and through their help-we were able to resolve the hyper-  coagulopathy, and the anemia with the transfusion and vitamin K.  The  patient was tolerating her hospital stay quite well, as long as she was not  moving that right knee.  We decided to undergo a closed manipulation to try  and reduce her pain and get her range-of-motion.  The patient only had about  0 to 30 degrees of that right without excruciating pain.  She had the above  procedure on July 04, 2005, which she tolerated well.  On post-op day  one, she was moderately sore, used her PCA quite a bit, but otherwise was  doing okay.  Post-op day two, the patient states she was doing wonderfully.  She was on a CPM machine, was able to get to 0 to 110 degrees without any  difficulty.  She was  not febrile, and she is scheduled to go home.  All lab  work was within normal limits including her potassium and her blood counts.   DISCHARGE PLAN:  The patient will be discharged home on July 06, 2005.   ACTIVITY:  The patient to be weight bearing as tolerated with CPM machine 8  to 10 hours a day of that right knee.   FOLLOW UP:  The patient will follow-up with Dr. Esmond Plants in one week.   DISCHARGE MEDICATIONS:  1.  Trazodone 100 mg q.h.s.  2.  Zocor 5 mg q. day.  3.  Cozaar 100 mg q. day.  4.  Protonix 40 mg q. day.  5.  Hydrochlorothiazide 12.5 mg q. day.  6.  Elavil 100 mg q.h.s.  7.  Lopressor 25 mg b.i.d.  8.  Robaxin 500 mg q.6.  9.  Vicodin 5/500 one tab q.4 to 6 p.r.n. pain.  10. Lovenox  30 mg b.i.d. subcu.  11. Xanax 0.5 mg t.i.d. p.r.n.   CONDITION:  Stable.   DIET:  Regular.      Thomas B. Dixon, P.A.    ______________________________  Doran Heater. Veverly Fells, M.D.    TBD/MEDQ  D:  07/06/2005  T:  07/06/2005  Job:  [id number]

## 2010-12-12 NOTE — Op Note (Signed)
NAME:  SUZEN, HETZER                           ACCOUNT NO.:  [id number]   MEDICAL RECORD NO.:  [id number]                   PATIENT TYPE:  INP   LOCATION:  X001                                 FACILITY:  [facility]   PHYSICIAN:  Doran Heater. Veverly Fells, M.D.              DATE OF BIRTH:  08-01-1930   DATE OF PROCEDURE:  07/28/2002  DATE OF DISCHARGE:                                 OPERATIVE REPORT   PREOPERATIVE DIAGNOSIS:  Left knee osteoarthritis, severe.   POSTOPERATIVE DIAGNOSIS:  Left knee osteoarthritis, severe.   PROCEDURE:  Left total knee replacement using Osteonics Scorpio posterior-  stabilized prosthesis.   SURGEON:  Doran Heater. Veverly Fells, M.D.   ASSISTANT:  Laurence Slate, M.D.   ANESTHESIA:  General anesthesia.   ESTIMATED BLOOD LOSS:  Less than 100 cc.   FLUIDS REPLACED:  1500 cc crystalloid.   COUNTS:  Instrument counts correct.   COMPLICATIONS:  There were no complications.   URINE OUTPUT:  300 cc.   INDICATIONS:  The patient is a 77 year old female with a long history of  left knee pain.  The patient has undergone prior left knee arthroscopy,  partial medial meniscectomy, demonstrating severe osteoarthritis.  She has  also undergone corticosteroid injections and Synvisc injections without  relief of her pain.  The patient has osteoarthritic findings on her x-ray as  well as at arthroscopy.  After discussing with the patient options for  management, to include continued conservative management, pain medications,  activity modifications, versus surgical arthroplasty, the patient desired a  total knee arthroplasty.  Risk of infection, blood clots, and patellar  fracture were discussed.  The patient understands and wants to proceed.   DESCRIPTION OF PROCEDURE:  After an adequate level of anesthesia was  achieved, the patient was positioned supine on the operating room table.  A  nonsterile tourniquet was placed on the left proximal thigh, and the left  leg was prepped  and draped in its entirety in the usual sterile fashion.  After exsanguination of the limb using Esmarch bandage, the tourniquet was  then elevated to 350 mmHg.  A longitudinal skin incision was created in the  midline of the knee.  Utilizing a 10 blade scalpel, dissection was carried  sharply down through the subcutaneous tissues using the knife and Bovie.  A  medial parapatellar arthrotomy was created.  Lateral patellofemoral  ligaments were released, the patella was everted, the knee flexed.  The ACL  was removed sharply using the Bovie.  At this point the 10 mm drill was used  to open up the distal femur and the distal femoral cutting guide was placed,  and this was set on 5 degrees left.  Ten millimeters of distal femur were  removed utilizing an oscillating saw.  At this point a sizing block was  placed on the distal femur.  This was used to demonstrate the a perfect size  7 Osteonics Scorpio implant.  At this point the size 7 cutting block was  placed on the end of the distal femur with care toward appropriate external  rotation of the femoral component.  This was done off of the epicondylar  axis as a reference.  Once the block was secured to the femur utilizing  pins, the AP cuts and chamfer cuts were performed utilizing oscillating saw.  Once this was completed, the medial and lateral meniscus were removed.  The  posterior cruciate ligament was also removed, allowing the tibia to be  subluxed forward.  Once this was done, the tibial external alignment guide  was placed on the femur with attention toward appropriate posterior slope  and varus-valgus alignment rods were used to confirm alignment prior to  attaching the cutting block to the proximal tibia.  This was done with  resection of approximately 4-6 mm off the affected side.  Once this was  attached to the tibia, then the alignment again was checked with the long  alignment rods and then the oscillating saw was used to cut the  proximal  tibia at a 90 degree angle.  Once this was done, the femoral trochlear  cutting guide was placed to allow for the trochlear groove to be cut in the  femur.  Once this was done, the femoral component was placed on the femur.  The size 7 tibia was selected, as this was the appropriate size on checking  the tibial size, and the size 7 tibial tray with a 10 posterior-stabilized  trial insert was placed into the knee.  The knee was then checked for varus-  valgus stability, which was perfect.  Alignment was excellent.  The patient  was able to obtain full extension and full flexion without any lift-off.  At  this point alignment again was checked with alignment rods and the tibial  base plate was marked for rotation on the tibia with the Bovie.  The tibial  keel punches were then used to place the tibia in appropriate rotation.  There was excellent bony support around the entire tibia.  At this point the  patella was resurfaced and it was a 22 mm thick patella prior to  resurfacing, and we then removed approximately 8-9 mm of patella to allow  for an 8 mm polyethylene domed insert to be placed, which was done.  This re-  established the 22 mm thickness in the patella.  The knee was taken through  a full range of motion.  No patellar instability was noted, with no fingers.  At this point trial inserts were removed.  The knee was thoroughly  irrigated.  The bone cement was mixed.  This was the Osteonics Simplex  mixed,  third generation technique, with vacuum mixing.  No antibiotics were  placed in the cement.  The cement was then placed onto the proximal tibia  and used to cement the 7 mm tibial component into position, followed by the  size 7 left femur in appropriate position.  Excess cement was removed.  The  trial 10 insert was removed.  The knee was placed in full extension and  compressed axially.  The size 7, 8 mm domed patellar insert was then cemented into place and held with a  clamp until all cement was hard.  At  this point the knee was taken through a full range of motion.  Again  excellent patellar tracking was noted.  No release was performed.  The trial  insert was removed and the real 10 mm posterior-stabilized insert was placed  under direct visualization and taken through a full range of motion prior to  placing the polyethylene insert.  Marcaine 0.5% with epinephrine was  inserted in the posterior capsule.  Some bone was placed in the femoral  shaft prior to cementing the femoral component on to prevent bleeding as  well.  More Marcaine and epinephrine was utilized in the peripatellar  tissues and near the peripatellar arthrotomy.  Next the Hemovac drain was  placed through the lateral retinaculum and then the peripatellar arthrotomy  was closed using interrupted figure-of-eight #1 PDS suture, followed by 2-0  Vicryl and staples for the skin.  The patient tolerated surgery well, was  taken to the recovery room in stable condition.  The tourniquet was deflated  at 1 hour 45 minutes.                                                Doran Heater. Veverly Fells, M.D.    SRN/MEDQ  D:  07/28/2002  T:  07/28/2002  Job:  [id number]

## 2010-12-12 NOTE — Cardiovascular Report (Signed)
NAME:  Jacqueline [name], Jacqueline [name] NO.:  [id number]   MEDICAL RECORD NO.:  [id number]          PATIENT TYPE:  INP   LOCATION:  2027                         FACILITY:  [facility]   PHYSICIAN:  Eden Lathe. Einar Gip, MD       DATE OF BIRTH:  Dec 19, 1930   DATE OF PROCEDURE:  06/09/2005  DATE OF DISCHARGE:                              CARDIAC CATHETERIZATION   PROCEDURE PERFORMED:  1.  Left ventriculography.  2.  Selective right and left coronary arteriography.  3.  Arch aortogram.  4.  Left subclavian arteriography.   INDICATIONS:  [name] is a 77 year old female who has a history of  known coronary artery disease, status post CABG with LIMA to LAD and SVG to  diagonal-2 in 2004, was admitted for a elective right hip replacement.  During the hospitalization, she complained of chest pain which was relieved  with nitroglycerin.  Given her cardiac history, given the fact that she had  chest pain which is nitrate responsive, she was brought to the cardiac  catheterization lab to re-evaluate her coronary anatomy.   HEMODYNAMIC DATA:  1.  The left ventricular pressures were 117/7 with an end diastolic pressure      of 14-mmHg.  2.  The aortic pressures were 113/60 with a mean of 80-mmHg.  There was no      pressure gradient across the aortic valve.   ANGIOGRAPHIC DATA:  1.  Left ventricle.  Left ventricular systolic function was normal with an      ejection fraction of 60-65% with no significant mitral regurgitation.      There was no wall motion abnormality.  2.  Right coronary artery.  The right coronary artery is a large caliber      vessel dominant.  It gives origin to a large PDA and a large PLA.  It is      smooth and normal.  3.  Left main.  The left main is a large caliber vessel.  4.  Circumflex.  The circumflex is a large caliber vessel.  It continues as      a large post marginal-1, after giving a A-V groove branch.  It is      normal.  5.  LAD.  The LAD is a large  caliber vessel.  It gives origin to a small      diagonal-1 which has a proximal 70% stenosis but this is very small.      There is computative filling noted in the distal LAD.  The distal LAD is      smooth, otherwise normal and is supplied by the saphenous vein graft to      the diagonal-1.  The LAD also gives origin to a diagonal-2 which has      ostial 70% stenosis.  At this stenosis there is a 90% focal stenosis of      the LAD.  6.  SVG to D-2.  SVG to D-02 is widely patent.  The LAD fills retrograde      from the diagonal vessel.  7.  LIMA to  LAD.  The LIMA to LAD is patent but atretic.   ARCH AORTOGRAM:  1.  Revealed a type 3 arch.  The right innominate, left common carotid, and      left subclavian arteries was widely patent.  2.  Subclavian artery and LIMA.  The left subclavian artery was widely      patent.  LIMA was patent, however, it is small and atretic.  The LAD      filling is faintly noted.  I was unable to cannulate selectively the      LIMA.   IMPRESSIONS:  1.  Normal left ventricular systolic function.  2.  Atretic but patent left internal mammary artery.  The left anterior      descending artery, however, has almost dual to triple supply with      native, ipsilateral collaterals and contralateral collaterals and also      retrograde excellent filling from the diagonal-2 bypass.  The left      internal mammary artery is patent, however, it is atretic, hence there      is still some flow in the left anterior descending artery.  3.  Patent saphenous vein graft to diagonal-2 which retrograde fills the      left anterior descending artery.  4.  High grade stenosis of the left anterior descending artery at the origin      of the diagonal-2.  5.  Type 3 aortic arch with patent three vessels.   RECOMMENDATIONS:  1.  Based on the anatomy, continued medical therapy is advised with      outpatient Cardiolite stress test.  If she does show anterior wall      ischemia,  then elective stenting of the LAD can be considered.      Otherwise given the fact that she has both computative filling through      the SVG graft through diagonal and also through the native vessel      filling, would continue medical therapy only at this time.  2.  The LIMA was not cannulated, however, appears very small and atretic.      Given the fact that she has type 3 arch, I saw that this was hardest to      cannulate the LIMA.   A total of 175 cc of contrast was utilized for diagnostic angiography.   TECHNIQUE OF THE PROCEDURE:  Under usual sterile precautions using a 6-  French right femoral artery access, a 6-French mp-b2  catheter was advanced  into the ascending aorta .  The catheter was then advanced into the  left  ventricle.  The left ventricle pressure monitored.  Hand contrast injection  of the LV was performed both in the LAO and RAO position.  The catheter was  flushed with saline pulled back in the ascending aorta, pressure gradient  across the aortic valve was monitored.  The right coronary artery was  selectively engaged and angiography was performed.  Ascending aortogram was  also performed.  Then the catheter was pulled from the body in the usual  fashion and a 6-French Judkins left 4 diagnostic catheter was utilized to  engage the left main coronary artery and angiography was performed.  Then  the catheter was pulled out of the body and a LIMA catheter was utilized in  an attempt to cannulate the LIMA.  However, I was unable to.  However, this  LIMA catheter was used to cannulate the SVG to diagonal and angiography was  repeated.  Then the catheter was pulled out of the body and an angled  pigtail catheter was utilized to perform arch aortogram in the LAO  projection.  Then using this landmark, I was able to cannulate the left  subclavian artery with the help of a 6-French MPB-2 catheter.  Then using an exchange J-length wire, a 5-French LIMA was advanced into  the left  subclavian artery and left subclavian arteriogram was performed.  I was  unable to selectively cannulate the LIMA.  Then the catheter was pulled out  of the body in the usual fashion.  The patient tolerated the procedure well.  No immediate complications were noted.      Eden Lathe. Einar Gip, MD  Electronically Signed     JRG/MEDQ  D:  06/09/2005  T:  06/09/2005  Job:  [id number]   cc:   William A. Andria Frames, M.D.  Fax: [phone]

## 2010-12-12 NOTE — Discharge Summary (Signed)
NAME:  Jacqueline [name], Jacqueline [name]                           ACCOUNT NO.:  [id number]   MEDICAL RECORD NO.:  [id number]                   PATIENT TYPE:  INP   LOCATION:  [place]                                 FACILITY:  [facility]   PHYSICIAN:  Doran Heater. Veverly Fells, M.D.              DATE OF BIRTH:  06/03/1931   DATE OF ADMISSION:  07/28/2002  DATE OF DISCHARGE:  08/03/2002                                 DISCHARGE SUMMARY   ADMISSION DIAGNOSES:  1. Osteoarthritis left knee.  2. Hypertension.  3. Peptic ulcer disease.   DISCHARGE DIAGNOSES:  1. Osteoarthritis left knee status post left total knee replacement.  2. Hypertension.  3. Peptic ulcer disease.  4. Postoperative hemorrhagic anemia.   PROCEDURES:  The patient was taken to the operating room on July 28, 2002  and underwent a left total knee replacement.  Surgeon was Dr. Esmond Plants;  assistant was Dr. Laurence Slate.  Surgery was done under general  anesthesia.  Hemovac drain x1 was placed at the time of surgery.   CONSULTS:  1. Physical therapy and occupational therapy.  2. Social work/case management.  3. Physical medicine/rehabilitation with Dr. Jarvis Morgan.   BRIEF HISTORY:  The patient is a 77 year old female with a long history of  left knee pain.  The patient had undergone previous left knee arthroscopy  with partial meniscectomy and had also undergone a series of Synvisc  injections but neither has helped.  The patient is still complaining of  severe pain.  She has pain that has limited her daily activities and wishes  to have something definitive done.  The risks and benefits of the procedure  were discussed with the patient and the patient wished to proceed.   LABORATORY DATA:  CBC on admission showed a hemoglobin of 12.0, hematocrit  34.5, white blood cell count 3.9, and a red blood cell count of 3.7.  Serial  H&H's were followed throughout hospital stay and hemoglobin and hematocrit  did decline to 8.1 and 23.3  respectively on August 02, 2002; however, at the  time of discharge hemoglobin and hematocrit had risen and were stabilizing  at 9.0 and 26.3.  Differential on admission was all within normal limits.  Coagulation studies on admission all within normal limits.  PT/INR on  August 03, 2002 at the time of discharge:  PT 20.4, INR 1.9 on Coumadin.  Routine chemistry on admission all within normal limits.  Follow-up  chemistry on July 29, 2002 was all within normal limits.  Urinalysis on  admission showed appearance cloudy with trace leukocyte esterase, many  epithelials, many bacteria, hyaline casts; follow-up urinalysis on July 28, 2002 showed trace leukocyte esterase with few epithelial.  The patient's  blood type is O negative with antibody screen negative.  EKG on admission  showed normal sinus rhythm, was a normal ECK.   X-rays:  Preoperative x-rays of the chest reveals no  acute disease.  Postoperative films on July 28, 2002 were of a left total knee  arthroplasty.   HOSPITAL COURSE:  The patient was admitted to [facility] and taken  to the operating room.  She underwent the above-stated procedure without  complications.  The patient tolerated the procedure well, was sent to the  recovery room and the orthopedic floor to continue postoperative care.  Postoperative day #1 the patient was resting comfortably and using PCA.  Hemovac drain was discontinued at this time.  The patient was to mobilize  with physical therapy and occupational therapy.  On July 30, 2002 seen by  orthopedics, postoperative day #2, was doing well.  The patient had already  been up in the hallway and sitting in a chair.  Hemoglobin and hematocrit  9.1 and 26.1.  PCA was discontinued at this time.  The dressing was changed  and incision was healing well.  On July 31, 2002, postoperative day #3,  seen by orthopedics.  The patient was resting comfortably, complaining of  some minor soreness.   Hemoglobin and hematocrit 9.0 and 26.0.  The patient  was continued with physical therapy and occupational therapy; rehab consult  was pending at time of being seen.  On August 01, 2001, postoperative day  #4, seen by orthopedics.  The patient was resting comfortably, Tmax of  101.1.  Hemoglobin and hematocrit 8.8 and 25.4.  Range of motion of the knee  was from 0 to 50-60 degrees.  The patient was continued with PT/OT.  Disposition was looking for a SACU bed in the next three days or a rehab  stay in a nursing home.  On August 02, 2001, postoperative day #5, the  patient was resting comfortably, no complaints.  Hemoglobin and hematocrit  8.1 and 23.3.  The patient was continued with physical therapy, occupational  therapy, was doing well, and incision was healing well.  Was hopeful for  [facility] rehab stay the following day.  On August 03, 2002,  postoperative day #6, seen by orthopedics.  The patient complaining of some  pain in left knee.  Hemoglobin and hematocrit 9.0 and 26.3.  Incision  healing well.  Tmax was 99.9.  The patient was to discharged to the [facility] on this day.   DISPOSITION:  The patient was discharged to the [facility] on August 03, 2002.   DISCHARGE MEDICATIONS:  1. Hydrochlorothiazide 25 mg one p.o. daily.  2. Amitriptyline 100 mg one p.o. q.h.s.  3. Ativan 1 mg p.o. b.i.d.  4. Colace 100 mg p.o. b.i.d.  5. Trinsicon one caplet p.o. t.i.d.  6. Coumadin per pharmacy protocol.  7. Laxative of choice one unit p.o. p.r.n.  8. Enema of choice one unit PR p.r.n.  9. Reglan 10 mg p.o. q.8h. p.r.n.  10.      Percocet one to two p.o. q.4-6h. p.r.n.  11.      Tylenol 325-650 mg p.o. q.4h. p.r.n. temperature greater than 101.  12.      Robaxin 500 mg one p.o. q.6-8h. p.r.n.  13.      Cepacol lozenge one tablet p.o. p.r.n.   DIET:  As tolerated.   ACTIVITY:  Weightbearing as tolerated.  WOUND CARE:  Daily dressing changes.  The staples are to be removed  in two  weeks from day of surgery.    FOLLOW-UP:  The patient is to follow up two weeks from day of surgery if  possible.  If not in  two weeks, the patient is to follow up as soon as  possible from day of surgery and is to call the office for an appointment.   CONDITION ON DISCHARGE:  Stable/improved.     Pedro Earls, P.A.-C.                   Doran Heater. Veverly Fells, M.D.    SW/MEDQ  D:  08/03/2002  T:  08/03/2002  Job:  [id number]

## 2010-12-12 NOTE — Op Note (Signed)
NAME:  MALVENIA, HENSEL                 ACCOUNT NO.:  [id number]   MEDICAL RECORD NO.:  [id number]          PATIENT TYPE:  INP   LOCATION:  5036                         FACILITY:  [place]   PHYSICIAN:  Doran Heater. Veverly Fells, M.D. DATE OF BIRTH:  04/24/1931   DATE OF PROCEDURE:  07/04/2005  DATE OF DISCHARGE:                                 OPERATIVE REPORT   PREOPERATIVE DIAGNOSIS:  Right knee stiffness following total knee  replacement.   POSTOPERATIVE DIAGNOSIS:  Right knee stiffness following total knee  replacement.   PROCEDURE PERFORMED:  Right knee manipulation under anesthesia.   SURGEON:  Doran Heater. Veverly Fells, M.D.   ASSISTANT:  None.   ANESTHESIA:  General.   ESTIMATED BLOOD LOSS:  None.   FLUIDS REPLACED:  500 mL crystalloid.   COUNTS:  Instrument counts were correct.   COMPLICATIONS:  None.   INDICATIONS FOR PROCEDURE:  The patient is a 77 year old female five weeks  status post total knee replacement with range of motion from 5 degrees to 40  degrees.  The patient has failed to progress with physical therapy and  presents now for manipulation under anesthesia.  Informed consent was  obtained.   DESCRIPTION OF PROCEDURE:  After an adequate level of general anesthesia was  obtained, the patient's knee was examined under anesthesia.  She had range  of motion of 5 degrees to 40 degrees.  She was then manipulated with  progressive force on the proximal tibia and the distal femur such that a  total of 120 degrees of range of motion was achieved.  We were also able to  work on her extension a little bit and got her within 2 degrees of full  extension.  We then placed a compressive wrap around her knee and awakened  her and took her to the recovery room in stable condition.           ______________________________  Doran Heater Veverly Fells, M.D.     SRN/MEDQ  D:  07/04/2005  T:  07/04/2005  Job:  [id number]

## 2010-12-12 NOTE — H&P (Signed)
NAME:  Jacqueline [name], Jacqueline [name]                           ACCOUNT NO.:  [id number]   MEDICAL RECORD NO.:  [id number]                   PATIENT TYPE:  EMS   LOCATION:  [place]                                 FACILITY:  [place]   PHYSICIAN:  Leslye Peer, MD                    DATE OF BIRTH:  Dec 26, 1930   DATE OF ADMISSION:  03/30/2003  DATE OF DISCHARGE:                                HISTORY & PHYSICAL   CHIEF COMPLAINT:  Chest pain.   HISTORY OF PRESENT ILLNESS:  [age]-year-old white female with history of  angioplasty in May 2004 to the diagonal-2 who had done well with no further  chest pain until today at around 2:30 P.M.  She was picking up things in the  yard to [facility] and developed  midsternal chest pain severely with radiation to  her back, down both arms and into her jaw.  She was short of breath, but no  diaphoresis, nausea or vomiting.  She was also very pale.  She could barely  made it back into the house, called the office and they told her to call  911; and, with some family insistence she did call 911.  En route she was  given four baby aspirins and nitro paste with improvement of her pain.  She  is now pain free.   Associated symptoms:  Recently she has been short of breath at night and  having to sleep to two pillows instead of her usual one.   PAST MEDICAL HISTORY:  Past medical history is positive for:  1. Coronary disease; presented with chest pain in May 2004.  EF was 60% on     cath, LAD diagonal-1 had 80% stenosis, medically treated; diagonal-2 80-     90 with angioplasty performed.  2. The patient also has hypertension.  3. Peptic ulcer disease.  4. Osteoarthritis.  5. Anxiety.  6. Anemia.  7. Questionable lung nodule on CT in August 2004.  8. Diverticulosis.  9. Allergic rhinitis.   PAST SURGICAL HISTORY:  History of:  1. Hysterectomy.  2. Hemorrhoidectomy.  3. Rotator cuff repair in 1999.  4. Left total knee in January 2004.   FAMILY HISTORY:  Family history  is not significant to this admission.   SOCIAL HISTORY:  The patient is widowed in March of this year.  No tobacco  and no alcohol.  Lives alone and is quite independent.   REVIEW OF SYSTEMS:  GENERAL:  No recent colds or fevers.  SKIN:  Negative.  HEENT:  negative.  CARDIOVASCULAR: See HPI.  LUNGS: Has been short of breath  at night with increased of pillows.  GASTROINTESTINAL: Some constipation,  which is chronic.  No indigestion, diarrhea or melena.  GENITOURINARY: No  hematuria or dysuria.  NEUROLOGIC: No lightheadedness.  No dizziness.  MUSCULOSKELETAL: Some osteoarthritis.   PHYSICAL EXAMINATION:  VITAL SIGNS:  Vital signs today;  temp 97.3, pulse 63,  respirations 20 and blood pressure 138/73.  GENERAL: An alert and oriented white female in no acute distress.  SKIN:  Warm and dry.  Brisk capillary refill.  Pale.  HEENT: Unremarkable.  Sclerae clear.  NECK:  Supple.  No JVD.  No bruits.  No thyromegaly.  LUNGS:  Clear, except rales, greater in the left base.  No adenopathy.  HEART:  S1 and S2.  Regular rate and rhythm.  ABDOMEN: Soft and nontender with positive bowel sounds.  Cannot palpate  liver, spleen or masses.  EXTREMITIES: No edema.  Pedal pulses are present.  NEUROLOGIC:  Alert and oriented times three.  Moves all extremities.  Follows directions.  Positive facial symmetry.   MEDICATIONS:  Outpatient meds:  1. Toprol XL 100 mg 1/2 daily.  2. Aspirin 81 mg daily.  3. Geritol daily.  4. Hyzaar 100/25 daily.  5. Amitriptyline 100 q.h.s.  6. Lipitor 20 daily.  7. Lorazepam, questionable dose, p.o. b.i.d.   ALLERGIES:  No known allergies.   LABORATORY DATA:  Pro time 13, INR 1.  Hemoglobin 10.6, hematocrit 31, WBC  5.1 and platelets 205,000.  Sodium 142, potassium 4.2, BUN 23, creatinine  1.3, and glycose 95.  Myoglobin 99.  CK MB less than 1.0.  Troponin less  than 0.05.  The second set of myoglobin was 111, CK MB less than 1.0 ans  troponin I less than 0.05.    IMPRESSION:  1. Unstable angina, acute coronary syndrome.  2. Coronary disease as described.  3. Hyperlipidemia.  4. Anxiety.  5. Hypertension.   PLAN:  1. Admit.  2. Rule out MI.  3. Place on telemetry bed.  4.     Check iron levels.  5. Hemoccult stools.  6. Depending on labs and how patient does may stay and have cardiac cath     next week for further evaluation.        Otilio Carpen. [place], N.P.                     Leslye Peer, MD    LRI/MEDQ  D:  03/30/2003  T:  03/30/2003  Job:  [id number]

## 2010-12-12 NOTE — Discharge Summary (Signed)
NAME:  LASHEENA, NORDHOFF                 ACCOUNT NO.:  [id number]   MEDICAL RECORD NO.:  [id number]          PATIENT TYPE:  INP   LOCATION:  3                         FACILITY:  [facility]   PHYSICIAN:  Doran Heater. Veverly Fells, M.D. DATE OF BIRTH:  21-May-1931   DATE OF ADMISSION:  05/29/2005  DATE OF DISCHARGE:  06/03/2005                                 DISCHARGE SUMMARY   ADMISSION DIAGNOSES:  1.  Right knee osteoarthritis.  2.  Hypertension.  3.  History of gastroesophageal reflux disease.   DISCHARGE DIAGNOSES:  1.  Right knee osteoarthritis status post right total knee arthroplasty.  2.  Hypertension.  3.  History of gastroesophageal reflux disease.   BRIEF HISTORY:  The patient is a 77 year old female that presented to the  office complaining about some continued right knee pain that was refractory  to cortisone and physical therapy. The patient had elected to have a total  knee arthroplasty to be completed by Dr. Esmond Plants.   PROCEDURE:  The patient had a right total knee arthroplasty on May 29, 2005 by Dr. Esmond Plants using the DePuy rotating system. The attending  surgeon was Esmond Plants, MD, assistant was Shelle Iron, P.A.-C.  Estimated blood loss was minimal. Fluid replacement was 2200 mL, urine  output was 400 mL. Perioperative antibiotics were given.   HOSPITAL COURSE:  The patient was admitted for the above stated procedure on  May 29, 2005 which she tolerated well and after adequate time in the  post anesthesia care unit she was transferred up to the orthopedic floor.  Postoperative day 1 she was complaining about some moderate pain in that  right knee especially when she gets up with physical therapy but otherwise  it was tolerable. It was noted that her pulse ox did drop down to the mid  60s when she was up with physical therapy on postoperative days 1 and 2. We  did supplement her with some oxygen which did bring her sats up to 95% on  only 2 liters while  she was up and she did not need the oxygen while she was  lying in bed. She was on the CPM machine. She did have difficulty reaching  90 degrees up until postop day 5 when she was able to get to about 80 to 85  degrees and we did stress that she needed to get as much flexion in this  knee as possible early. She was in agreement with that. The patient did have  a quickly rising INR due to Lovenox and Coumadin together. We did DC the  Lovenox on postoperative day 4 and just continued with the Coumadin and she  was in agreement with that also. The plan was to be discharged home on  June 03, 2005.   PLAN:  The patient will be discharged home on June 03, 2005.   CONDITION ON DISCHARGE:  Stable.   DIET:  Regular.   DISCHARGE MEDICATIONS:  1.  Cozaar 100 mg p.o. daily.  2.  Amitriptyline 100 mg q.h.s.  3.  Hydrochlorothiazide 12.5 mg p.o. daily.  4.  Metoprolol 50 mg 1/2 tab b.i.d.  5.  Alprazolam 0.5 mg t.i.d. p.r.n. anxiety.  6.  Prilosec 20 mg p.o. daily.  7.  Crestor 10 mg p.o. daily.   ALLERGIES:  She has no known drug allergies.   ACTIVITY:  Weightbearing as tolerated with knee immobilizer and crutches and  also work on her flexion.      Thomas B. Dixon, P.A.    ______________________________  Doran Heater. Veverly Fells, M.D.    TBD/MEDQ  D:  06/03/2005  T:  06/03/2005  Job:  [id number]

## 2010-12-12 NOTE — Discharge Summary (Signed)
NAME:  Jacqueline [name], Jacqueline [name] NO.:  [id number]   MEDICAL RECORD NO.:  [id number]          PATIENT TYPE:  INP   LOCATION:  2027                         FACILITY:  [place]   PHYSICIAN:  Shelva Majestic, M.D.     DATE OF BIRTH:  [birth year]   DATE OF ADMISSION:  06/06/2005  DATE OF DISCHARGE:  06/10/2005                                 DISCHARGE SUMMARY   DISCHARGE DIAGNOSIS:  1.  Chest pain, resolved, negative cardiac enzymes so most likely angina.  2.  Coronary artery disease with history of bypass grafting in 2004 and      currently does have coronary artery disease, saphenous vein graft to the      diagonal was patent, left internal mammary artery graft patent but very      small and atretic.  She had a 90% left anterior descending stenosis, as      well.  3.  Normal left ventricular function.  4.  Hypertension.  5.  Hyperlipidemia.  6.  Recent right total knee replacement on May 29, 2005.   CONDITION ON DISCHARGE:  Stable.   PROCEDURE:  Combined left heart cath with graft visualization by Dr. Adrian Prows June 09, 2005, with LIMA patent but very small and atretic and LAD  stenosis, EF 60%.   DISCHARGE MEDICATIONS:  1.  Lopressor 50 mg 1/2 tablet q.8h.  2.  Cozaar 100 mg daily.  3.  Crestor 10 mg daily.  4.  Hydrochlorothiazide 12.5 mg daily.  5.  Enteric coated aspirin 81 mg daily.  6.  Coumadin 5 mg every evening, call Friday for results.  7.  Amitriptyline 100 mg every evening.  8.  Alprazolam 0.5 mg as needed as before.  9.  Prilosec 20 mg daily.  10. Nitroglycerin sublingual p.r.n. chest pain.  11. Lovenox 30 mg subcu q.12h.   DISCHARGE INSTRUCTIONS:  Have blood drawn on Friday morning to evaluate for  Coumadin.  Follow up with Dr. Einar Gip June 30, 2005, at 9:45 a.m.  He is  scheduled for Persantine Cardiolite June 22, 2005, at 10 a.m., to not  eat or drink after midnight the night before the test, not to put perfume  on, no milk products to drink  for 48 hours before as well as no caffeine.   HISTORY OF PRESENT ILLNESS:  77 year old white female cardiology patient of  Dr. Irven Shelling was seen in the emergency room on June 06, 2005, for chest  pain.  She has a history of coronary artery disease, CABG in 2004 x 2  vessels by Dr. Servando Snare.  She has a history of hypertension, hyperlipidemia,  has been doing well cardiac wise and then on May 29, 2005, had right  knee replacement by Dr. Wiliam Ke.  She had been seen pre-surgery by Dr.  Einar Gip and apparently was cleared for surgery.  The day of admission,  June 06, 2005, she was noted at rest significant chest tightness and  pressure like prior to her CABG.  This lasted about three hours and  eventually resolved in the emergency room with IV nitroglycerin.  OUTPATIENT MEDICATIONS:  Crestor 10, Cozaar 100,  Metoprolol 25 b.i.d.,  aspirin daily, hydrochlorothiazide 12.5 mg daily, Naprosyn 50/25 p.r.n.,  Coumadin 2.5 alternating with 3.75, and Robaxin.   PAST MEDICAL HISTORY:  CABG.  She had left knee replacement two years ago  and now right knee replacement, history of hysterectomy and foot surgery.   For social history, family history, and review of systems, see H&P.   DISCHARGE PHYSICAL EXAMINATION:  Blood pressure 134/78, pulse 75,  respirations 20, afebrile, 99% saturation room air.  Lungs clear.  Heart  regular rate and rhythm.  Abdomen positive bowel sounds.  Extremities no  significant edema.  Right groin was stable from cath site.   LABORATORY DATA:  Hemoglobin on admission 11.3, hematocrit 33.2, WBC 7.4,  platelets 299, neutrophils 67, lymphs 17, mono 10, eos 6, baso 0.  Protime  23.8, INR 2.1, PTT 54.  Hemoglobin remained stable.  INR dropped for cardiac  catheterization and was put on Lovenox as well as Coumadin as an outpatient.  Chemistry sodium 137, potassium 4.1, chloride 103, CO2 22, glucose 97, BUN  20, creatinine 1, calcium 8.9.  AST 35, ALT 27, ALP 147,  total bilirubin  0.7.  These remained stable.  Cardiac markers CK 48, 57, 57, MB 1.1, 1.7,  1.8, troponin I 0.02 x 3.  Negative MI.  BNP 34.8.  TSH 3.997.  UA clear.  Chest x-ray showed no acute cardiopulmonary disease.  EKG sinus rhythm, no  changes when compared to previous EKGs.  No other EKG changes during the  hospitalization.   HOSPITAL COURSE:  Ms. Prochazka was admitted by Dr. Claiborne Billings on call for Dr. Einar Gip  June 06, 2005, with chest pain.  Coumadin was held for heparin Coumadin  cross over.  She was placed on IV nitroglycerin.  The D-dimer was 2.26.  The  patient continued to be stable.  Once INR was less than 1.5, she underwent  cardiac catheterization with results as previously described.  The patient  was stable, Coumadin was restarted along with Lovenox, and by June 10, 2005, she was ready for discharge home.  She used her CPM during the  hospitalization, as well.  She was seen and evaluated by Dr. Mathis Bud on  call for Dr. Einar Gip June 10, 2005, and her heparin was changed to Lovenox  and she will follow up as an outpatient.      Otilio Carpen. Dorene Ar, N.P.    ______________________________  Shelva Majestic, M.D.    LRI/MEDQ  D:  08/13/2005  T:  08/13/2005  Job:  [id number]   cc:   [place] Lathe. Einar Gip, MD  Fax: [place]. Veverly Fells, M.D.  Fax: [place]. Andria Frames, M.D.  Fax: [phone]

## 2010-12-12 NOTE — H&P (Signed)
NAME:  Jacqueline [name], Jacqueline [name]                           ACCOUNT NO.:  [id number]   MEDICAL RECORD NO.:  [id number]                   PATIENT TYPE:  INP   LOCATION:  4742                                 FACILITY:  [facility]   PHYSICIAN:  Jamal Collin. Hensel, M.D.             DATE OF BIRTH:  June 22, 1931   DATE OF ADMISSION:  12/03/2002  DATE OF DISCHARGE:                                HISTORY & PHYSICAL   CHIEF COMPLAINT:  High blood pressure.   HISTORY OF PRESENT ILLNESS:  The patient is a 77 year old Caucasian female  presented to [facility] Emergency Room with complaints of high blood  pressure and she was found to have a 2- to 3-week history of chest  pressure, therefore was transferred to [facility] for admission.  The patient has no pain currently but describes intermittent substernal  chest tightness that lasts 5-10 minutes and relieves when she relaxes.  The pain is not exertional and is not related to food, it is nonradiating.  She does have associated shortness of breath and diaphoresis but no nausea  or vomiting.  Her husband recently passed away in 2002-10-22 and chest  pressure started afterwards and the patient attributes it to increased  stress.   REVIEW OF SYSTEMS:  Per HPI as well as positive for palpitations during  episodes of chest pressure as well as positive for heartburn and increased  anxiety since her husband's death as well as arthritis in her knees and  back.  The patient denies fever or chills, weight change, night sweats,  cough, rashes, constipation, dizziness, blurry vision or dysuria, or melena.   PAST MEDICAL HISTORY:  1. Hypertension.  2. Peptic ulcer disease.  3. Osteoarthritis.  4. Anxiety.  5. Anemia.  6. Allergic rhinitis.  7. Diverticulosis.   PAST SURGICAL HISTORY:  1. Hysterectomy.  2. Hemorrhoidectomy.  3. Right rotator cuff surgery in 1997-10-21.  4. Left total knee replacement in January 2004.   MEDICATIONS:  1. Aspirin 81 mg daily.  2.  Tums four or more tabs daily.  3. Elavil 100 mg at bedtime.  4. Toprol XL increased 2 weeks ago to 100 mg daily.  5. Ativan 1 mg three times daily.   The patient discontinued hydrochlorothiazide 2 weeks ago when her Toprol was  increased.   ALLERGIES:  No known drug allergies.   SOCIAL HISTORY:  The patient lives with her son and is of [employer], is  retired from [employer] (medical).  She denies tobacco or alcohol use.  Her husband passed  away in 2002-10-22 and she is now independent with finances and driving  herself.   FAMILY HISTORY:  Father's history is unknown, mother had Parkinson's, and  family history is negative for cancer, hypertension, diabetes, or coronary  artery disease.   PHYSICAL EXAMINATION:  VITAL SIGNS:  Temperature 97.8, pulse 67,  respirations 18, blood pressure 168/66, oxygen saturation 95%  on room air.  GENERAL:  Pleasant well-nourished, well-developed Caucasian female in no  acute distress alert and oriented x3.  HEENT:  PERRLA.  EOMI.  Sclerae are nonicteric.  Nares without discharge.  Oropharynx without erythema or exudate.  Moist mucous membranes.  NECK:  No lymphadenopathy.  Full range of motion.  No thyromegaly.  Supple.  LUNGS:  Clear to auscultation bilaterally with good effort.  No crackles or  rhonchi.  HEART:  Regular rate and rhythm with no murmurs, rubs, or gallops.  ABDOMEN:  Soft, nontender, nondistended, normoactive bowel sounds.  No  hepatosplenomegaly.  EXTREMITIES:  Has 2+ pedal pulses bilaterally.  No lower extremity edema.  Capillary refill less than 3 seconds.  SKIN:  No rashes or lesions or petechiae noted.  NEUROLOGIC:  Cranial nerves II-XII grossly intact.  Deep tendon reflexes 2+  bilaterally.  Random alternating movements intact.  RECTAL:  Good tone with small hard stool in vault and guaiac negative.   LABORATORY DATA:  Sodium 137, potassium 4.1, chloride 103, bicarb 28, BUN  21, creatinine 0.9, glucose 95, calcium 9.3, total bilirubin  0.8, alkaline  phosphatase 93, AST 20, ALT 16, total protein 6.9, albumin 3.9.  White blood  count 6.2, hemoglobin 11.1, hematocrit 32.3, platelets 227, MCV 89.4.  Urinalysis shows specific gravity 1.006, trace leukocyte esterase, negative  nitrite, 0-2 white blood cells.  Total CK is 69, CK-MB 2.0, troponin I 0.02.  EKG shows normal sinus rhythm with rate of 65, no ST elevation or T wave  changes.  Chest x-ray with some cardiomegaly, no obvious infiltrates, and  has not been read by the radiologist yet.   ASSESSMENT AND PLAN:  The patient is a 77 year old white female with history  of peptic ulcer disease and anxiety who now presents with intermittent chest  pressure.   Problem 1. CHEST PRESSURE.  Risk factors for coronary artery disease include  age and hypertension thus far.  We will obtain fasting lipid panel to  further risk stratify.  We will check serial cardiac enzymes and repeat EKG  to rule out myocardial infarction but differential diagnosis of chest pain  includes gastrointestinal and anxiety as etiologies.  If the patient rules  out for myocardial infarction would consider outpatient stress test to risk  stratify.  We will also check TSH as the patient history of anxiety and  palpitation.   Problem 2. HYPERTENSION - NOT WELL CONTROLLED ON BETA BLOCKER.  We will add  angiotensin-converting enzyme inhibitor given chest pressure for added  effect.  Hesitate to increase Toprol secondary to current heart rate.  Continue to monitor the blood pressure.   Problem 3. ANXIETY.  May be contributing to chest pressure given recent loss  of husband and we will continue Ativan 1 mg three times daily as well as  check a TSH.   Problem 4. PEPTIC ULCER DISEASE.  Start Protonix daily.     Sallyanne Havers, M.D.                       William A. Andria Frames, M.D.    AS/MEDQ  D:  12/03/2002  T:  12/04/2002  Job:  [id number]

## 2010-12-12 NOTE — Op Note (Signed)
NAME:  Jacqueline [name], Jacqueline [name]                           ACCOUNT NO.:  [id number]   MEDICAL RECORD NO.:  [id number]                   PATIENT TYPE:  INP   LOCATION:  2307                                 FACILITY:  [facility]   PHYSICIAN:  Lilia Argue. Servando Snare, M.D.            DATE OF BIRTH:  10/03/1930   DATE OF PROCEDURE:  04/04/2003  DATE OF DISCHARGE:                                 OPERATIVE REPORT   PREOPERATIVE DIAGNOSES:  Recurrent angina with restenosis of angioplasty  site.   POSTOPERATIVE DIAGNOSES:  Recurrent angina with restenosis of angioplasty  site.   OPERATION PERFORMED:  Off-pump coronary artery bypass grafting times two  with the left internal mammary artery to the left anterior descending  coronary artery and reversed saphenous vein graft to the diagonal coronary  artery.   SURGEON:  Lilia Argue. Servando Snare, M.D.   ASSISTANT:  Suzzanne Cloud, P.A.   ANESTHESIA:  General.   INDICATIONS FOR PROCEDURE:  The patient is a 77 year old female who in the  spring of 2004 underwent cutting balloon angioplasty of a small second  diagonal branch of the LAD because of chest pain.  Initially, she did well  but in June began having recurrent episodes of chest pain and was evaluated  in [place] with CT scan.  At that time there was a question of a small lung  nodule which has been followed by serial CT scans and felt not to be of  significance, so a follow-up CT has been done in September and another  recommended in three months.  The patient was also noted on CT scan to have  aberrant take off of the right subclavian artery.  Because of unstable  anginal symptoms, the patient was readmitted at this time and underwent  cardiac catheterization by Eden Lathe. Einar Gip, M.D., which demonstrated restenosis  of the second diagonal and now has a high grade stenosis involving the LAD  at the take off of the diagonal, not felt to be appropriate  for repeat  angioplasty.  The right coronary artery and  circumflex are free of disease.  Coronary artery bypass grafting was recommended to the patient, who agreed  and signed informed consent.   DESCRIPTION OF PROCEDURE:  With Swann-Ganz and arterial line monitors in  place, the patient underwent general endotracheal anesthesia without  incident.  The skin of the chest and legs was prepped with Betadine and  draped in the usual sterile manner.  A median sternotomy was performed.  The  left internal mammary artery was dissected down as a pedicle graft.  The  distal artery was divided and had good free flow.  The vessel was  hydrostatically dilated with heparinized saline.  The pericardium was  opened.  Overall ventricular function appeared preserved.  It appeared  adequate to perform off pump bypass.  The Guidant stabilization system was  used.  A short segment of vein was harvested from  the right lower extremity  as it did not appear anatomically suitable to place a sequential mammary to  the diagonal and LAD.  The diagonal was a small vessel but with adequate  stabilization and hemodynamics, the patient was heparinized.  Vessel loops  were placed around the diagonal artery.  The vessel was opened and admitted  a 1 mm probe.  Using running 7-0 Prolene, distal anastomosis was performed  with a segment of reversed saphenous vein graft.  A partial occlusion clamp  was placed on the ascending aorta and anastomosed with running 6-0 Prolene.  Attention was then turned to the left anterior descending coronary artery.  In a similar fashion, vessel loops were placed proximally and distally at  the site of anastomosis. The vessel was opened and using running 8-0  Prolene, the left internal mammary artery was anastomosed to the left  anterior descending coronary artery.  The patient tolerated hemodynamically  the off-pump bypass.  Blood flow was restored to the mammary artery.  Protamine sulfate was administered.  The fascia of the pedicle graft was   tacked to the epicardium.  With the operative field hemostatic, two atrial  and two ventricular pacing wires were applied.  Graft markers were applied.  Pericardium was loosely reapproximated.  Sternum was closed with #6  stainless steel wire.  Fascia closed with interrupted 0 Vicryl, running 3-0  Vicryl in the subcutaneous tissues and 4-0 subcuticular stitch in the skin  edges.  Dry dressings were applied.  Sponge and needle counts were reported  as correct at the completion of the procedure.  The patient tolerated the  procedure without obvious complication and was transferred to the surgical  intensive care unit for further postoperative care.                                                 Lilia Argue Servando Snare, M.D.    Mcneil Sober  D:  04/05/2003  T:  04/05/2003  Job:  [id number]   cc:   Eden Lathe. Einar Gip, M.D.  [address] [place]  [place] [address]  Fax: [phone]

## 2010-12-12 NOTE — H&P (Signed)
NAME:  Jacqueline [name], Jacqueline [name]                 ACCOUNT NO.:  [id number]   MEDICAL RECORD NO.:  [id number]          [name] TYPE:   LOCATION:                                 FACILITY:   PHYSICIAN:  Doran Heater. Veverly Fells, M.D. DATE OF BIRTH:  10-25-1930   DATE OF ADMISSION:  05/29/2005  DATE OF DISCHARGE:                                HISTORY & PHYSICAL   CHIEF COMPLAINT:  Jacqueline [name] is a 77 year old female with chief complaint of  right knee pain.   HISTORY OF PRESENT ILLNESS:  Jacqueline [name] has been complaining about right  knee pain that has been worsening over Jacqueline past 2 years.  She has been  having some increasing pain with ambulation and it has been refractory to  cortisone injections and physical therapy.  She has elected to have a total  knee arthroplasty by Doran Heater. Veverly Fells, M.D. on May 29, 2005.   ALLERGIES:  No known drug allergies.   MEDICATIONS:  1.  Crestor 10 mg p.o. daily.  2.  Cozaar 100 mg daily.  3.  Metoprolol 50 mg 1/2 tablet daily.  4.  Amitriptyline 100 mg nightly.  5.  Prilosec 20 mg daily.  6.  Aspirin 81 mg daily.  7.  Alprazolam 0.5 mg t.i.d.  8.  Hydrochlorothiazide 100 mg daily.   PAST MEDICAL HISTORY:  Anxiety, hypertension, and dyspnea on exertion.   SOCIAL HISTORY:  Jacqueline [name] is a [name] of [employer] A. Andria Frames, M.D.  She is  a widow.  She is retired.  She does not smoke and does not use any alcohol.   FAMILY HISTORY:  Negative.   REVIEW OF SYSTEMS:  Negative except for pain with ambulation and also a  history of constipation.   PHYSICAL EXAMINATION:  VITAL SIGNS:  Pulse 72, respirations 18, blood  pressure 146/88.  GENERAL:  Jacqueline [name] is a 77 year old healthy-appearing female in no acute  distress.  Pleasant mood and affect.  Alert and oriented x3.  HEENT:  Full range of motion without any difficulty.  No tenderness along  Jacqueline cervical, thoracic, or lumbar spines.  She does have Cranial nerves II-  XII grossly intact.  CHEST:  Active breath sounds  bilaterally with no wheezes, rhonchi, or rales.  HEART:  Regular rate and rhythm with no murmur.  ABDOMEN:  Nontender, nondistended, and active bowel sounds in all four  quadrants and no pulsatile masses felt.  EXTREMITIES:  Moderate crepitus with range of motion of Jacqueline right knee only.  She does have decreased strength of 4.5/5 in gross quadriceps and hamstring  strength as compared with left.  Neurovascularly she is intact.  Distal  capillary refill is less than 2 seconds.  She has no pedal edema.  She does  have a mildly antalgic gait due to Jacqueline right knee pain. Examination of  bilateral upper extremities shows full range of motion.  Sensation is  grossly intact.  No gross deformity.   X-ray shows right knee osteoarthritis which is severe.   IMPRESSION:  Right knee osteoarthritis, severe.   PLAN:  She is to have a  right total knee arthroplasty by Dr. Veverly Fells on  May 29, 2005.      Thomas B. Dixon, P.A.    ______________________________  Doran Heater. Veverly Fells, M.D.    TBD/MEDQ  D:  05/19/2005  T:  05/19/2005  Job:  [id number]

## 2010-12-12 NOTE — Op Note (Signed)
NAME:  Jacqueline [name], Jacqueline [name]                           ACCOUNT NO.:  [id number]   MEDICAL RECORD NO.:  [id number]                   PATIENT TYPE:  AMB   LOCATION:  [place]                                  FACILITY:  [facility]   PHYSICIAN:  Doran Heater. Veverly Fells, M.D.              DATE OF BIRTH:  06-13-1931   DATE OF PROCEDURE:  04/06/2002  DATE OF DISCHARGE:                                 OPERATIVE REPORT   PREOPERATIVE DIAGNOSES:  Left knee osteoarthritis and medial meniscal tear.   POSTOPERATIVE DIAGNOSES:  Left knee advanced osteoarthritis, lateral  compartment, greater than medial compartment with anterior and posterior  horn lateral meniscal tears, small medial meniscal tear and grade 4  chondromalacia in the medial and lateral compartments as well as grade 3  chondromalacia of the patella.   OPERATION PERFORMED:  Left knee arthroscopy with debridement of medial and  lateral meniscal tears and abrasion chondroplasty of both medial and lateral  compartments as well as the patellofemoral joint.   SURGEON:  Doran Heater. Veverly Fells, M.D.   ASSISTANT:  None.   ANESTHESIA:  General.   ESTIMATED BLOOD LOSS:  0.   TOURNIQUET TIME:  0.   FLUIDS REPLACED:  1200 cc crystalloid.   INSTRUMENT COUNT:  Correct.   COMPLICATIONS:  None.   Preoperative antibiotics were given.   INDICATIONS FOR PROCEDURE:  The patient is a 77 year old female who presents  after complaints of an increasing left medial knee pain.  The patient had an  MRI scan done and shows evidence of a complex posterior horn medial meniscal  tear.  After discussing with the patient her options for management to  include injections versus physical therapy versus surgery, she would like to  proceed with surgical evaluation and treatment using arthroscope.  The risks  of infection and blood clot were mentioned.  She understands these risks.  Informed consent was signed.   DESCRIPTION OF PROCEDURE:  After an adequate level of anesthesia was  achieved, the patient was positioned in supine position on the operating  table.  The left knee was examined under anesthesia.  She was noted to have  a range of motion from 0 to 110 degrees, stable medially and laterally to  varus and valgus stress also stable with anterior drawer and negative  Lachman.  After completion of the examination under anesthesia, a nonsterile  tourniquet was placed on the left proximal thigh.  The left leg was then  prepped and draped in its entirety in the usual sterile fashion.  Diagnostic  arthroscopy was performed through the standard arthroscopic portals,  superolateral outflow, anterolateral scope and anteromedial working portals  were all created in similar fashion with infiltration of 0.5% Marcaine with  epinephrine followed by incision with an 11 blade scalpel, introduction of  the cannula into the joint using blunt obturators.  Diagnostic arthroscopy  revealed grade 3 chondromalacia of the patella on both  medial and lateral  facets, primarily distally.  There was also some mild trochlear  chondromalacia grade 2.  The medial and lateral gutters were free of loose  bodies.  Scope was placed in the lateral compartment.  There was noted to be  complex posterior horn and anterior horn tears.  These were debrided back to  stable fistula using a full radius resector.  There was noted to be grade 4  eburnation on the femoral condyle and on the tibia.  The remainder of the  meniscus appeared stable.  The ACL was noted to be in its normal location.  The medial compartment was entered and there was noted to be some  delamination and grade 3 and grade 4 chondromalacia noted on the medial  femoral condyle.  Also a small free edge tear of the medial meniscus  debrided using a full radius resector.  Attention was directed towards the  patellofemoral joint where abrasion chondroplasty was performed on the  patella.  The joint was thoroughly irrigated.  The knee was  taken through a  full range of motion.  No more loose bodies were noted.  The wounds were  sutured using 4-0 Monocryl suture followed by Steri-Strips and a sterile  dressing.  The patient tolerated the procedure well and was taken to the  PACU in stable condition.                                                  Doran Heater. Veverly Fells, M.D.    SRN/MEDQ  D:  04/06/2002  T:  04/07/2002  Job:  [id number]

## 2010-12-12 NOTE — Discharge Summary (Signed)
NAME:  DENIAH, TABET                           ACCOUNT NO.:  [id number]   MEDICAL RECORD NO.:  [id number]                   PATIENT TYPE:  INP   LOCATION:  [id number]                                 FACILITY:  [facility]   PHYSICIAN:  Sallyanne Havers, M.D.                 DATE OF BIRTH:  13-Dec-1930   DATE OF ADMISSION:  12/03/2002  DATE OF DISCHARGE:  12/06/2002                                 DISCHARGE SUMMARY   DISCHARGE DIAGNOSES:  1. Coronary artery disease.  2. Hypertension.  3. Hyperlipidemia.  4. Anxiety.  5. Peptic ulcer disease.  6. Degenerative joint disease.   PROCEDURES:  1. 2D echocardiogram.  2. Cardiac catheterization with cutting balloon angioplasty of left anterior     descending artery.   CONSULTATIONS:  Cardiology.   DISCHARGE MEDICATIONS:  1. Aspirin 81 mg q.d.  2. Elavil 100 mg q. h.s.  3. Protonix 40 mg q. day.  4. Toprol-XL 100 mg q. Day.  5. Ativan 1 mg t.i.d.  6. Tums q.i.d.  7. Lisinopril 10 mg q.d.  8. Plavix 75 mg q.d. for six months.  9. Zocor 40 mg q. h.s.   DISPOSITION AND FOLLOWUP:  The patient was stable and discharged home and  instructed to follow up with Dr. Andria Frames on Friday May 14, at 2:30 PM.   ADMISSION HISTORY:  This 77 year old Caucasian female presented to the  [facility] Emergency Department with complaint of high blood pressure and  she was found to have a 2-3 week history of chest pressure and therefore was  transferred to [facility] for workup of this chest pressure and to rule out  for myocardial infarction.   HOSPITAL COURSE:  Problem 1.  Coronary artery disease - On admission the  patient's history was consistent with stable angina and her risk factors for  heart disease included age and hypertension as well as hyperlipidemia.  She  was ruled out for myocardial infarction by three separate cardiac enzymes  and cardiology was consulted. A 2D echocardiogram was obtained which showed  an ejection fraction of 55 to 65% with mild  mitral valve regurgitation and  left atrium was mild to moderately dilated.  Cardiology felt that this  patient warranted a cardiac catheterization and she was taken to the OR on  May 11 and was found to have 80-90% stenosis of a small distal branch of the  left anterior descending coronary artery, which was treated with a cutting  balloon. See procedure dictation for further details.  The patient was  discharged the day after procedure and she was chest pain free and will be  discharged on medical management with Plavix, aspirin, and beta blocker, Ace  inhibitor and a statin for optimal management.   Problem 2. Hypertension.  The patient's blood pressure was well controlled  on discharge with Toprol and Lisinopril. She will follow up with her primary  physician  for this.   Problem 3.  Hyperlipidemia. The patient was found to have HDL of 138 on  admission and her goal is now less than 100, therefore, she is started on a  statin and will continue that as an outpatient.   Problem 4.  Anxiety. The patient has a recent loss of her husband one month  prior to this admission and stated she has had a lot of stress recently due  that and thought this chest pressure was secondary to the increased stress.  Her anxiety remained controlled on Ativan 3 times a day and that will be  continued as an outpatient, as well.   Problem 5.  History of peptic ulcer disease. The patient had complaints of  clinical heart burn and was started on Protonix daily, which she will also  continue as an outpatient.   LABORATORY DATA:  CBC on May 10 showed white count 6.5, hemoglobin 11.4,  hematocrit 33.0. Platelet count 227. INR 0.8.  Basic metabolic panel is  within normal limits.  Troponin I was 0.01 x 2.  Lipid profile shows total  cholesterol 215, triglycerides 206, HDL 46, LDL 128. TSH 4.612.                                               Sallyanne Havers, M.D.    AS/MEDQ  D:  12/08/2002  T:  12/09/2002  Job:   [id number]   cc:   William A. Hensel, M.D.  [address] [place]  [place] [address]  Fax: [phone]

## 2010-12-12 NOTE — Discharge Summary (Signed)
NAME:  Jacqueline [name], Jacqueline [name]                           ACCOUNT NO.:  [id number]   MEDICAL RECORD NO.:  [id number]                   PATIENT TYPE:  INP   LOCATION:  2001                                 FACILITY:  [facility]   PHYSICIAN:  Lilia Argue. Servando Snare, M.D.            DATE OF BIRTH:  [birth year]   DATE OF ADMISSION:  03/30/2003  DATE OF DISCHARGE:  04/09/2003                                 DISCHARGE SUMMARY   ADMISSION DIAGNOSIS:  Chest pain.   DISCHARGE DIAGNOSES:  1. Severe coronary artery disease.  2. History of peptic ulcer disease.  3. Hypertension.  4. Hyperlipidemia.  5. Postoperative anemia.   PROCEDURE:  1. Cardiac catheterization.  2. Off-pump coronary artery bypass grafting x2 (left internal mammary artery     to the left anterior descending, saphenous vein graft to the second     diagonal).   HISTORY OF PRESENT ILLNESS:  The patient is a 77 year old white female who  over the past several months has had episodic chest discomfort.  She was  admitted in May of 2004 and underwent catheterization and subsequent  angioplasty and cutting balloon dilatation of a lesion of the diagonal  coronary. She did well initially, but in June developed episodes of  shortness of breath especially when lying down and with associated chest  discomfort.  She was seen in a hospital in [place] in June for these  symptoms and underwent a CT scan to rule out dissection.  She was discharged  on her current medication regimen.  On the date of this admission, she  developed chest pain radiating to her jaw associated with exertion.  She  called 911 at that time because of the severity and was brought into [facility] for further evaluation and treatment.   HOSPITAL COURSE:  She was started on IV nitroglycerin and cardiac enzymes  were obtained which ultimately were negative.  EKG showed no evidence of an  acute myocardial infarction.  She was seen by cardiology and it was felt  that because  of her recurrent symptoms and her previous history, she should  undergo repeat cardiac catheterization. This was performed on April 03, 2003, by Eden Lathe. Einar Gip, M.D. and she was found to have severe two vessel  coronary artery disease.  It was recommended that because of the complexity  of the lesion that she be evaluated by Dr. Servando Snare for possible coronary  artery bypass graft surgery.  Dr. Servando Snare saw the patient and agreed that  she was good candidate for an off-pump CABG procedure. The patient agreed to  proceed and she was taken to the operating room on April 04, 2003, where  she underwent off-pump CABG x2 as described in detail above. She tolerated  the procedure well and was transferred to the SICU in stable condition.  She  was extubated shortly after surgery. She was hemodynamically stable and  doing well  on postoperative day #1.  She was initially maintained on  nitroglycerin drip postoperatively, but this was slowly weaned and was  discontinued by postoperative day #2.  At that time, she was able to be  transferred to the floor.  Overall, she has done well postoperatively.  She  has been anemic with a hemoglobin of 8.1 and a hematocrit of 24.3.  Initially she tolerated this, but later developed hypotension and some  shortness of breath associated with this. She was transfused a unit of  packed red blood cells which brought her H&H back up to 10.3 and 30,  respectively. Since that time, her blood pressure has been stable. She has  been afebrile and maintaining normal sinus rhythm.  Otherwise, she has done  well during this admission. She has been placed on supplemental iron to help  improve her anemia. She has been ambulating in the halls with cardiac rehab  phase I and is doing very well.  She is tolerating a regular diet and is  having normal bowel and bladder function. Her surgical incision sites are  healing well.  It was felt that as she is recovering well, that she is  ready  for discharge home at this time.   DISCHARGE MEDICATIONS:  1. Enteric-coated aspirin 325 mg daily.  2. Toprol XL 25 mg daily.  3. Folic acid 1 mg daily.  4. Fergon 300 mg b.i.d.  5. Hyzaar 100/25 daily.  6. Lipitor 20 mg daily.  7. Amitriptyline and Lorazepam at home doses.  8. Tylox one to two q.4h p.r.n. for pain.   DISCHARGE INSTRUCTIONS:  She is to refrain from driving, heavy lifting, or  strenuous activity.  She is asked to continue daily walking and use of her  incentive spirometer.  She may continued a low fat, low sodium diet. She is  asked to shower daily and clean her incisions with soap and water.   FOLLOW UP:  She will see Dr. Einar Gip back in the office on April 24, 2003,  at 10 a.m.  She will then follow up with Dr. Servando Snare on Thursday, October  7, at 12:30 p.m.  She is asked to have a chest x-ray prior to this visit at  [facility] and to bring her films to Dr. Everrett Coombe office  for him to review. She will call our office in the interim if she  experiences any problems or has questions.      Suzzanne Cloud, P.A.                        Lilia Argue Servando Snare, M.D.    GC/MEDQ  D:  04/09/2003  T:  04/09/2003  Job:  [id number]   cc:   William A. Hensel, M.D.  [address]  [place] [address]  Fax: [phone]   [place] Einar Gip, M.D.  [address], [address]  [place] [address]  Fax: [phone]

## 2010-12-12 NOTE — H&P (Signed)
NAME:  Jacqueline [name], Jacqueline [name] NO.:  [id number]   MEDICAL RECORD NO.:  [id number]                   PATIENT TYPE:   LOCATION:                                       FACILITY:   PHYSICIAN:  Doran Heater. Veverly Fells, M.D.              DATE OF BIRTH:  03-12-1931   DATE OF ADMISSION:  07/28/2002  DATE OF DISCHARGE:                                HISTORY & PHYSICAL   CHIEF COMPLAINT:  Left knee pain.   HISTORY OF PRESENT ILLNESS:  The patient is a 77 year old female with a long  history of left knee pain. The patient has undergone previous left knee  arthroscopy with a partial meniscectomy and also undergone a series of  Synvisc injections which neither has helped. The patient is still  complaining of severe pain. She has pain that is limiting her daily  activities and wishes to have something definitive done. The risks and  benefits of the procedure have been discussed with the patient and the  patient wishes to proceed.   PAST MEDICAL HISTORY:  1. Hypertension.  2. Peptic ulcer disease.  3. Osteoarthritis.   PAST SURGICAL HISTORY:  1. Right rotator cuff repair.  2. Hysterectomy.  3. Left bunionectomy.  4. Bilateral Morton's neuromas.   MEDICATIONS:  1. Hydrochlorothiazide 25 mg 1 p.o. q.d.  2. Lorazepam 1 mg 1 p.o. b.i.d.  3. Amitriptyline 100 mg 1 p.o. q.h.s.   ALLERGIES:  No known drug allergies.   SOCIAL HISTORY:  The patient denied any tobacco or alcohol. She is married.  She lives in a one story house with three steps entering the house. Her  husband has Alzheimer's disease.   FAMILY HISTORY:  Mother Parkinson's, father unremarkable.   REVIEW OF SYSTEMS:  GENERAL:  Denies fever or chills, night sweats, bleeding  tendencies. CNS:  Denies blurred vision or double vision, seizures,  headaches, paralysis. RESPIRATORY:  Denies shortness of breath, productive  cough, hemoptysis. CARDIOVASCULAR:  Denies chest pain, angina or orthopnea.  GI:  Denies  nausea or vomiting, diarrhea, constipation, melena or bloody  stools. GU:  Denies dysuria, hematuria or discharge. MUSCULOSKELETAL:  As  pertinent to HPI.   PHYSICAL EXAMINATION:  VITAL SIGNS:  Blood pressure 150/90, pulse 64,  respirations 12.  GENERAL:  A well developed, well nourished 77 year old female who is a  little anxious on examination today.  HEENT:  Normocephalic, atraumatic.  Pupils equally round and reactive to  light.  NECK:  Supple, no carotid bruits noted.  CHEST:  Clear to auscultation bilaterally, no wheezes or crackles.  HEART:  Regular rate and rhythm, no murmurs, rubs, gallops.  ABDOMEN:  Soft, nontender, nondistended, positive bowel sounds x4.  EXTREMITIES:  Tenderness to palpation in the medial and lateral joint line  of the left knee. Positive crepitus on the left knee. Range of motion is 0  to 100 degrees with pain on extremes  of range of motion. She  has good quad  tone. She is neurovascularly intact distally.  SKIN:  No rashes or lesions.   LABORATORY DATA:  An MRI reveals cartilage loss over the lateral femoral  condyle, chondromalacia and patellofemoral joint. X-ray reveals mild to  moderate osteoarthritis of the left knee.   IMPRESSION:  1. Osteoarthritis of the left knee.  2. Hypertension.  3. Peptic ulcer disease.   PLAN:  The patient will be admitted to [facility] on July 28, 2002, and undergo left  total knee arthroplasty by Doran Heater. Veverly Fells, M.D.     Pedro Earls, P.A.-C.                   Doran Heater. Veverly Fells, M.D.    SW/MEDQ  D:  07/24/2002  T:  07/24/2002  Job:  [id number]

## 2010-12-12 NOTE — Consult Note (Signed)
NAME:  Jacqueline [name], Jacqueline [name]                           ACCOUNT NO.:  [id number]   MEDICAL RECORD NO.:  [id number]                   PATIENT TYPE:  INP   LOCATION:  2399                                 FACILITY:  [facility]   PHYSICIAN:  Lilia Argue. Servando Snare, M.D.            DATE OF BIRTH:  [birth year]   DATE OF CONSULTATION:  DATE OF DISCHARGE:                                   CONSULTATION   REQUESTING PHYSICIAN:  Ulice Dash R. Einar Gip, M.D.   PRIMARY CARE PHYSICIAN:  Jamal Collin. Andria Frames, M.D.   REASON FOR CONSULTATION:  Coronary artery disease.   HISTORY OF PRESENT ILLNESS:  The patient is a 77 year old female who had  presented with vague chest discomfort in May of 2004. At that time, she  underwent catheterization and subsequent angioplasty and Cutting balloon  with dilatation of the lesion of the diagonal coronary artery. The patient  initially did well but in June of that year began having episodes of  shortness of breath, especially when she laid down, with chest discomfort,  improved with sitting up. She was seen in a hospital in [place] in June  with chest discomfort and underwent a CAT scan to rule out dissection. There  was a question of a right lower lobe pulmonary nodule. On September 3 while  picking up limbs in her yard, the patient had severe chest pain in the upper  chest radiating to her jaw along with shortness of breath. Because of this  pain, she rested and called 911 and was admitted to the hospital and  stabilized on IV nitroglycerin. She has had no further pain of that  severity. CK and MB serially were not elevated. The patient denies any  previous myocardial infarction. Cardiac risk factors include hypertension,  hyperlipidemia currently on Lipitor.   FAMILY HISTORY:  She has positive family history with a half brother who has  known coronary disease and underwent coronary artery bypass grafting. She  did not know her father. Mother died of Parkinson-related disease.   PAST  MEDICAL HISTORY:  The patient denies any stroke or claudication or  renal insufficiency. She has had past medical history significant for peptic  ulcer disease.   PAST SURGICAL HISTORY:  Past surgery includes left knee surgery and  arthritis of the right knee. History of hysterectomy in the past.   SOCIAL HISTORY:  The patient is recently widowed. Retired from work in a  [employer].   MEDICATIONS ON ADMISSION:  1. Hyzaar 100/25 q.d.  2. Lipitor 20 a day.  3. Toprol-XL 50 mg a day.  4. Amitriptyline q.h.s.  5. Lorazepam 25 b.i.d.  6. Aspirin 81 mg a day.   ALLERGIES:  None known.   REVIEW OF SYSTEMS:  CARDIAC REVIEW OF SYSTEMS:  Positive for chest pain,  resting shortness of breath while in [place] but otherwise has not had  resting shortness of breath. Does have exertional shortness of  breath,  orthopnea. Denies presyncope, syncope, palpitations, lower extremity edema.  GENERAL:  The patient has no constitutional symptoms. RESPIRATORY:  AS noted  above. Denies hemoptysis. Is not a previous smoker. GASTROINTESTINAL:  Has  had a history of peptic ulcer disease. No blood in her stool or urine.  NEUROLOGICAL:  Denies TIAs or amaurosis. MUSCULOSKELETAL:  Bilateral knee  osteoarthritis, recent surgery on the left knee. Also has chronic pain in  the right knee. GENITOURINARY:  Denies any blood in her urine. Denies  diabetes symptoms or hypothyroidism. PSYCHIATRIC:  History positive for  chronic use of amitriptyline and lorazepam, history of anxiety disorder.  Denies claudication. HEENT:  Is farsighted, otherwise has no change in  vision.   PHYSICAL EXAMINATION:  VITAL SIGNS:  Temperature 96.4, pulse 70 and regular,  respiratory rate 18, blood pressure 100/47, O2 saturation is 97%.  GENERAL:  The patient appears her stated age in no distress.  NECK:  She has no jugular venous distention. No carotid bruits.  LUNGS:  Clear bilaterally.  CARDIAC:  Reveals regular rate and rhythm  without murmur or gallop.  ABDOMEN:  Benign without palpable masses.  EXTREMITIES:  The lower extremities have adequate vein at both ankles  without pedal edema.  VASCULAR:  Reveals 2+ DP and PT pulses bilaterally, 2+ radial and branchial  pulses bilaterally.   LABORATORY DATA:  Reveal a hematocrit of 32.9, platelet count 185,000, white  count 4.5. Creatinine 1.1, BUN 18. Cardiac catheterization films were  reviewed and demonstrated greater than 90% stenosis of the LAD and second  diagonal. Circumflex and right coronary artery patent. Overall ventricular  function preserved.   With the recurrent stenosis of the diagonal now involving the LAD proximally  which was not present in May, bypass surgery to the diagonal LAD offers the  best long term solution for the patient. Possibility of bypass was discussed  with the patient and her family. The risks of planned coronary artery bypass  grafting including death, infection, stroke, myocardial infarction,  bleeding, blood transfusion are all discussed in detail. It is also to note  that patient had a 6-mm right lower lobe nondescript nodule noted on a  recent CT scan. This history was noted also in June of this year. The  patient also has indication of an anomalous right subclavian artery passing  behind the esophagus.   The plan is to proceed with coronary artery bypass grafting. The patient is  agreeable and planned for September 10.                                               Lilia Argue Servando Snare, M.D.    Mcneil Sober  D:  04/03/2003  T:  04/04/2003  Job:  [id number]

## 2010-12-12 NOTE — Assessment & Plan Note (Signed)
[facility]                           GASTROENTEROLOGY OFFICE NOTE   NAME:WHITTAubreyann, [name]                        MRN:          [id number]  DATE:03/11/2006                            DOB:          11-28-1930    Jacqueline [name] is a very nice 77 year old white female here today to evaluate  heme positive stool found by Dr. Ree Edman on pelvic exam.  Jacqueline [name] denies  any GI problems.  About 40 years ago she underwent internal hemorrhoidectomy  here in [place] and had no problems until several years ago when she  started having some irritation and problems with evacuation.  Her bowel  habits are usually irregular, somewhat constipated.  She denies abdominal  pain or weight changes.  There is no family history of colon cancer or  inflammatory bowel disease.   MEDICATIONS:  1. Simvastatin 40 mg p.o. daily.  2. Sertraline 25 mg p.o. daily.  3. Cozaar 100 mg p.o. daily.  4. Metoprolol 25 mg p.o. b.i.d.  5. Elavil 100 mg p.o. q.h.s.  6. Alprazolam 0.5 mg p.o. t.i.d.  7. HCTZ 12.5 mg p.o. daily.  8. Aspirin 81 mg p.o. daily.  9. Tergitol.  10.Prilosec 20 mg p.o. daily.  11.Zoloft 25 mg p.o. daily.   PAST HISTORY:  Significant for coronary artery bypass graft in September  2004, high blood pressure, hyperlipidemia, arthritis, anxiety, depression,  sleep apnea.  She also had a hysterectomy and hemorrhoid surgery.   FAMILY HISTORY:  Positive for heart disease.   SOCIAL HISTORY:  Widowed.  Lives alone.  Worked at [employer] until 1999.  The  does not smoke and does not drink alcohol.   REVIEW OF SYSTEMS:  Weight is stable, somewhat overweight.  Specific  complaints:  Sleeping problems, back pain, depression, severe fatigue.   PHYSICAL EXAMINATION:  VITAL SIGNS:  Blood pressure 112/70, pulse 74, weight  179 pounds.  GENERAL:  The patient was alert, oriented and in no distress.  HEENT:  Sclerae nonicteric.  Oral cavity normal.  LUNGS:  Clear to auscultation.  CARDIAC:  Normal S1 and S2.  ABDOMEN:  Soft, nontender, with normoactive bowel sounds.  No distention.  Liver edge at costal margin.  RECTAL:  Exam shows external hemorrhoidal tags, somewhat decreased rectal  tone.  Stool was soft and Hemoccult negative.   IMPRESSION:  A 77 year old white female with heme positive stool on previous  exam, not reproduced on today's exam.  The patient has never had a  colonoscopy.  She has symptomatic hemorrhoids status post remote  hemorrhoidectomy.  Because of her age of 77 and the fact that she never had  a colonoscopy I recommend that she has one.   PLAN:  1. Colonoscopy scheduled.  2. Samples of Analpram given to the patient to treat the rectal irritation      that may result from the colonoscopy prep.                                   Lowella Bandy. Olevia Perches, MD  DMB/MedQ  DD:  03/11/2006  DT:  03/11/2006  Job #:  [id number]   cc:   S. Olena Mater, MD  Jamal Collin. Andria Frames, MD

## 2010-12-12 NOTE — Cardiovascular Report (Signed)
NAME:  Jacqueline [name], Jacqueline [name]                           ACCOUNT NO.:  [id number]   MEDICAL RECORD NO.:  [id number]                   PATIENT TYPE:  INP   LOCATION:  6522                                 FACILITY:  [facility]   PHYSICIAN:  Eden Lathe. Einar Gip, M.D.                  DATE OF BIRTH:  May 23, 1931   DATE OF PROCEDURE:  12/05/2002  DATE OF DISCHARGE:  12/06/2002                              CARDIAC CATHETERIZATION   PROCEDURE PERFORMED:  1. Left ventriculography.  2. Selective right and left coronary arteriography.  3. Intracoronary nitroglycerin administration.  4. Cutting balloon angioplasty of the diagonal 2 branch of the left anterior     descending artery.   INDICATIONS FOR PROCEDURE:  The patient is a 77 year old female with a  history of hypertension, unknown cholesterol status, who was admitted to the  hospital with chest pain suggestive of unstable angina.  Given her continued  chest pain, she was brought to the cardiac catheterization lab with a high  suspicion for coronary artery disease to evaluate her coronary anatomy.   HEMODYNAMIC DATA:  1. The left ventricular pressures were 161/13 with an end diastolic pressure     of 11 mmHg.  2. Aortic pressure was 160/78 with a mean of 110 mmHg.  3. There was no pressure gradient across the aortic valve.   ANGIOGRAPHIC DATA:  1. Left ventricle:  The left ventricular systolic function was normal, and     the ejection fraction was estimated at 60%.  There was no significant     mitral regurgitation.  2. Right coronary artery:  The right coronary artery is a large caliber     vessel.  It gives origin to a large PDA and large PLV branch.  There is     mild 20%  to 30% luminal irregularity of the mid and distal right     coronary artery.  3. Left main coronary artery:  The left main coronary artery is a large     caliber vessel.  It is normal.  4. Circumflex coronary artery:  The circumflex coronary artery is a large     caliber vessel.   It continues as an obtuse marginal 2 after giving origin     to a very small obtuse marginal 1.  5. Left anterior descending artery:  The left anterior descending artery is     a large caliber vessel that ends at the apex.  It gives origin to a small     diagonal 1 which has proximal 70% to 80% stenosis and is a 1.5-mm vessel.     In the mid segment before the origin of a very large diagonal 2, there is     mild luminal irregularities constituting 20% stenosis.  The diagonal 2 is     very large and supplies a large part of the lateral wall.  This has 80%  to 90% stenosis.  The lesion is complex within an acute angle with the     left anterior descending artery.   INTERVENTIONAL DATA:  Successful PTCA with a 2.5 x 6-mm cutting balloon.  The stenosis was reduced from 80% to 90% to less than 20% with TIMI-3 to  TIMI-3 flow maintained with __________ dissection in its mid segment.  There  was no thrombus noted.  There was TIMI-3 to TIMI-3 flow maintained.   RECOMMENDATIONS:  The patient will be continued on Plavix for a period of 6  months.  The patient will be continued on Toprol-XL, Accupril with  aggressive control of hypertension.  She will also started on lipid-lowering  therapy.   TECHNIQUE OF PROCEDURE:  Under usual sterile precautions, using 6-French  right femoral arterial access, a 6-French multipurpose B2 catheter was  advanced to the ascending aorta over a 0.035-inch J wire.  The catheter was  gently advanced to the left ventricle.  Left ventricular pressures were  monitored.  Hand contrast injection of the left ventricle was performed,  both in LAO and RAO projections.  The catheter was flushed with saline,  pulled back into the ascending aorta, and pressure gradient across the  aortic valve was monitored.  The right coronary artery was selectively  engaged, and angiography was performed.  Then the catheter was pulled out of  the body in the usual fashion, and a 6-French  Judkins left diagnostic  catheterization was advanced into the ascending aorta.  In a similar  fashion, the left main coronary artery was selectively engaged, and  angiography was performed.  Intracoronary nitroglycerin, 200 mcg, was also  administered.   DESCRIPTION OF INTERVENTIONAL TECHNIQUE:  Initially, a 7-French FL 3.5, then  a 7-French FL 3.0 guide was utilized to engage the left main coronary artery  in the usual fashion.  Then an 190-cm x 0.014-inch Soft [employer]ahi guidewire was  utilized to cross into the LAD and then into the diagonal 2.  The tip of the  wire was carefully positioned in the distal diagonal bed.  Then a 2.5 x 6-mm  cutting balloon was advanced into the left anterior descending artery and  into the diagonal 2 branch.  After confirming the position of the balloon,  multiple cuts were made at 4 atmospheres of pressure for 60 seconds.  After  performing multiple cuts, the balloon was deflated, pulled back into the  guiding catheter, and arteriography was performed.  Intracoronary  nitroglycerin was also administered.  Excellent results were noted with  residual less than 20% stenosis.  After confirming the success, the guide  catheter was disengaged along with removing of the guidewire and the  balloon.  The arterial access sheath was sutured in place, and the patient  was transferred to recovery in a stable condition.  The patient tolerated  the procedure well.  During the procedure, adjuvant Angiomax was utilized,  and the ACT was maintained in the therapeutic range.                                               Eden Lathe. Einar Gip, M.D.    Jacqueline [name]  D:  12/05/2002  T:  12/06/2002  Job:  [id number]   cc:   Blane Ohara McDiarmid, M.D.  [address]  [place] [address]  Fax: [phone]   [facility] [place]

## 2010-12-28 ENCOUNTER — Other Ambulatory Visit: Payer: Self-pay | Admitting: Family Medicine

## 2010-12-29 ENCOUNTER — Other Ambulatory Visit: Payer: Self-pay | Admitting: Family Medicine

## 2010-12-29 DIAGNOSIS — Z1231 Encounter for screening mammogram for malignant neoplasm of breast: Secondary | ICD-10-CM

## 2010-12-29 NOTE — Telephone Encounter (Signed)
Refill request

## 2011-01-16 ENCOUNTER — Ambulatory Visit
Admission: RE | Admit: 2011-01-16 | Discharge: 2011-01-16 | Disposition: A | Discharge status: Home / Self Care | Payer: Medicare Other | Source: Ambulatory Visit | Attending: Family Medicine | Admitting: Family Medicine

## 2011-01-16 DIAGNOSIS — Z1231 Encounter for screening mammogram for malignant neoplasm of breast: Secondary | ICD-10-CM

## 2011-02-07 ENCOUNTER — Other Ambulatory Visit: Payer: Self-pay | Admitting: Family Medicine

## 2011-02-08 NOTE — Telephone Encounter (Signed)
Refill request

## 2011-02-17 ENCOUNTER — Telehealth: Payer: Self-pay | Admitting: Family Medicine

## 2011-02-17 ENCOUNTER — Ambulatory Visit (INDEPENDENT_AMBULATORY_CARE_PROVIDER_SITE_OTHER): Payer: Medicare Other | Admitting: Family Medicine

## 2011-02-17 VITALS — BP 111/74 | HR 64 | Temp 98.9°F | Ht 62.0 in | Wt 147.0 lb

## 2011-02-17 DIAGNOSIS — R197 Diarrhea, unspecified: Secondary | ICD-10-CM

## 2011-02-17 LAB — COMPREHENSIVE METABOLIC PANEL WITH GFR
ALT: 15 U/L (ref 0–35)
AST: 20 U/L (ref 0–37)
Albumin: 4.4 g/dL (ref 3.5–5.2)
Alkaline Phosphatase: 78 U/L (ref 39–117)
BUN: 39 mg/dL — ABNORMAL HIGH (ref 6–23)
CO2: 21 meq/L (ref 19–32)
Calcium: 9.5 mg/dL (ref 8.4–10.5)
Chloride: 100 meq/L (ref 96–112)
Creat: 2.07 mg/dL — ABNORMAL HIGH (ref 0.50–1.10)
Glucose, Bld: 116 mg/dL — ABNORMAL HIGH (ref 70–99)
Potassium: 3.7 meq/L (ref 3.5–5.3)
Sodium: 136 meq/L (ref 135–145)
Total Bilirubin: 0.5 mg/dL (ref 0.3–1.2)
Total Protein: 7.2 g/dL (ref 6.0–8.3)

## 2011-02-17 LAB — CBC
MCH: 29.8 pg (ref 26.0–34.0)
MCHC: 32.8 g/dL (ref 30.0–36.0)
Platelets: 226 10*3/uL (ref 150–400)
RDW: 14.3 % (ref 11.5–15.5)

## 2011-02-17 NOTE — Telephone Encounter (Signed)
pthas been having diarrhea since Sunday and wants to know what she can do.  She has tried Immodium, but has not helped.  During this message, another patient called to cancel appt and added her to Dr corey's schedule for 11:00

## 2011-02-17 NOTE — Assessment & Plan Note (Signed)
Etiology is likely viral gastroenteritis. However diverticulitis is a possibility. Fortunately her symptoms are resolving and her hemodynamics are stable and within the normal range. Her abdominal pain is not characteristic of peritonitis.  Plan: Obtain CMP and CBC now and f/u in clinic tomorrow. Red flags closely reviewed with the patient who expresses understanding.

## 2011-02-17 NOTE — Telephone Encounter (Signed)
Patient given an appointment.

## 2011-02-17 NOTE — Progress Notes (Signed)
Diarrhea: Jacqueline [name] presents to clinic today for diarrhea. Her symptoms started at 2am yesterday with diarrhea, nausea, some vomiting and some mild abdominal pain. She is able to keep fluids down and is producing urine. However when she eats crackers she has a BM very soon afterworlds. She has tried loperamide without much success. She notes that one stool did have some blood but she attributes this to known hemorrhoids. She states that her blood in her stool has resolved. Overall she feels "sick". She feels fatigued and worn out. She denies any fevers. She does note some mild lower abdominal pain and cramps. She thinks that she is getting better. The diarrhea is resolving. She does not want to go to the hospital.   Medical problems and medications reviewed with patient.  ROS as above otherwise neg  Exam:  Vs noted.  Gen: Well NAD HEENT: EOMI,, MMM Lungs: CTABL Nl WOB Heart: RRR no MRG Abd: NABS, ND, no masses. Mild TTP BL lower quadrants. No guarding or rebound.  Exts: Non edematous BL  LE. Cap refill is 2 seconds in BL fingers and toes.

## 2011-02-17 NOTE — Patient Instructions (Signed)
Thank you for coming in today. I think your diarrhea is likely due to a viral infection and it should be getting better on its own.  Avoid imodium.  Try to keep eating and drinking.  Come back tomorrow for a re-exam. I am trying to keep you out of the hospital. If you are still not better then we may be getting X-rays.  Let me know if you start getting worse, have a fever, stop passing gas, have worsening abdominal pain or feel worse.

## 2011-02-18 ENCOUNTER — Ambulatory Visit (INDEPENDENT_AMBULATORY_CARE_PROVIDER_SITE_OTHER): Payer: Medicare Other | Admitting: Family Medicine

## 2011-02-18 ENCOUNTER — Telehealth: Payer: Self-pay | Admitting: Family Medicine

## 2011-02-18 ENCOUNTER — Encounter: Payer: Self-pay | Admitting: Family Medicine

## 2011-02-18 VITALS — BP 105/71 | Temp 98.6°F | Ht 62.0 in | Wt 149.0 lb

## 2011-02-18 DIAGNOSIS — R197 Diarrhea, unspecified: Secondary | ICD-10-CM

## 2011-02-18 MED ORDER — CIPROFLOXACIN HCL 500 MG PO TABS: 500.0000 mg | ORAL_TABLET | Freq: Two times a day (BID) | ORAL | Status: DC

## 2011-02-18 MED ORDER — METRONIDAZOLE 500 MG PO TABS
500.0000 mg | ORAL_TABLET | Freq: Two times a day (BID) | ORAL | Status: AC
Start: 2011-02-18 — End: 2011-02-28

## 2011-02-18 NOTE — Assessment & Plan Note (Signed)
Bump in Creat and WBC=22K suggests colitis or diverticulitis.  Taking PO fluids well, will try ambulatory treatment

## 2011-02-18 NOTE — Patient Instructions (Addendum)
You likely have a bacterial infection of your intestines:  Probably colitis or diverticulitis You have two antibiotics at the drug store.  Pick them up tonight and take first dose tonight Do not take your hydrochlorothiazide (HCTZ), enalapril or ibuprofen for one week. Come back Friday for blood work if you are feeling better. If you are feeling worse, come back right away, you will be hospitalized. Drink lots of fluids - soup is also good.  I'm not worried about you eating, I want to make sure you don't get dehydrated.

## 2011-02-18 NOTE — Telephone Encounter (Signed)
Called at 11 am. Pt is not feeling any better at all. She has an appointment with Dr. Andria Frames at 230pm today. I again reviewed red flags and discussed that she may require hospitalization. She expresses understanding.

## 2011-02-20 ENCOUNTER — Emergency Department (HOSPITAL_COMMUNITY): Payer: Medicare Other

## 2011-02-20 ENCOUNTER — Inpatient Hospital Stay (HOSPITAL_COMMUNITY)
Admission: EM | Admit: 2011-02-20 | Discharge: 2011-02-23 | DRG: 392 | Disposition: A | Discharge status: Home / Self Care | Payer: Medicare Other | Attending: Family Medicine | Admitting: Family Medicine

## 2011-02-20 ENCOUNTER — Other Ambulatory Visit: Payer: Medicare Other

## 2011-02-20 ENCOUNTER — Telehealth: Payer: Self-pay | Admitting: Family Medicine

## 2011-02-20 DIAGNOSIS — K922 Gastrointestinal hemorrhage, unspecified: Secondary | ICD-10-CM | POA: Diagnosis present

## 2011-02-20 DIAGNOSIS — I10 Essential (primary) hypertension: Secondary | ICD-10-CM | POA: Diagnosis present

## 2011-02-20 DIAGNOSIS — E860 Dehydration: Secondary | ICD-10-CM | POA: Diagnosis present

## 2011-02-20 DIAGNOSIS — R197 Diarrhea, unspecified: Secondary | ICD-10-CM

## 2011-02-20 DIAGNOSIS — J309 Allergic rhinitis, unspecified: Secondary | ICD-10-CM | POA: Diagnosis present

## 2011-02-20 DIAGNOSIS — Z951 Presence of aortocoronary bypass graft: Secondary | ICD-10-CM

## 2011-02-20 DIAGNOSIS — I2510 Atherosclerotic heart disease of native coronary artery without angina pectoris: Secondary | ICD-10-CM | POA: Diagnosis present

## 2011-02-20 DIAGNOSIS — Z7982 Long term (current) use of aspirin: Secondary | ICD-10-CM

## 2011-02-20 DIAGNOSIS — D649 Anemia, unspecified: Secondary | ICD-10-CM | POA: Diagnosis present

## 2011-02-20 DIAGNOSIS — K5732 Diverticulitis of large intestine without perforation or abscess without bleeding: Principal | ICD-10-CM | POA: Diagnosis present

## 2011-02-20 DIAGNOSIS — F411 Generalized anxiety disorder: Secondary | ICD-10-CM | POA: Diagnosis present

## 2011-02-20 DIAGNOSIS — G2581 Restless legs syndrome: Secondary | ICD-10-CM | POA: Diagnosis present

## 2011-02-20 DIAGNOSIS — M545 Low back pain, unspecified: Secondary | ICD-10-CM | POA: Diagnosis present

## 2011-02-20 DIAGNOSIS — M1990 Unspecified osteoarthritis, unspecified site: Secondary | ICD-10-CM | POA: Diagnosis present

## 2011-02-20 DIAGNOSIS — G4700 Insomnia, unspecified: Secondary | ICD-10-CM | POA: Diagnosis present

## 2011-02-20 DIAGNOSIS — E785 Hyperlipidemia, unspecified: Secondary | ICD-10-CM | POA: Diagnosis present

## 2011-02-20 HISTORY — DX: Essential (primary) hypertension: I10

## 2011-02-20 LAB — CBC
HCT: 34.8 % — ABNORMAL LOW (ref 36.0–46.0)
Hemoglobin: 11 g/dL — ABNORMAL LOW (ref 12.0–15.0)
MCH: 29.1 pg (ref 26.0–34.0)
MCH: 30.1 pg (ref 26.0–34.0)
MCV: 92.1 fL (ref 78.0–100.0)
MCV: 86.9 fL (ref 78.0–100.0)
Platelets: 222 10*3/uL (ref 150–400)
RBC: 3.78 MIL/uL — ABNORMAL LOW (ref 3.87–5.11)
RDW: 14 % (ref 11.5–15.5)

## 2011-02-20 LAB — DIFFERENTIAL
Eosinophils Absolute: 0.2 10*3/uL (ref 0.0–0.7)
Eosinophils Relative: 2 % (ref 0–5)
Lymphs Abs: 1.6 10*3/uL (ref 0.7–4.0)
Monocytes Absolute: 0.6 10*3/uL (ref 0.1–1.0)
Monocytes Relative: 9 % (ref 3–12)

## 2011-02-20 LAB — COMPREHENSIVE METABOLIC PANEL
AST: 27 U/L (ref 0–37)
Albumin: 3.3 g/dL — ABNORMAL LOW (ref 3.5–5.2)
BUN: 27 mg/dL — ABNORMAL HIGH (ref 6–23)
CO2: 24 mEq/L (ref 19–32)
Calcium: 9.6 mg/dL (ref 8.4–10.5)
Creatinine, Ser: 1.34 mg/dL — ABNORMAL HIGH (ref 0.50–1.10)
GFR calc non Af Amer: 38 mL/min — ABNORMAL LOW (ref >60)

## 2011-02-20 LAB — OCCULT BLOOD, POC DEVICE: Fecal Occult Bld: POSITIVE

## 2011-02-20 LAB — LIPASE, BLOOD: Lipase: 38 U/L (ref 11–59)

## 2011-02-20 NOTE — Progress Notes (Signed)
BMP AND CBC DONE TODAY Jacqueline [name]

## 2011-02-20 NOTE — Telephone Encounter (Signed)
Received call from pt about recurrence of abd pain, cramping, and BRBPR. Pt was seen in clinic earlier in week and diagnosed w/ diverticulitis/colitis. Pt placed on cipro and flagyl for antibiotic coverage. Pt states that sxs improved initially x 1-2 days. However cramping and abd pain have recurred today. Pt also with 1 very large bloody bowel movement. Instructed pt to go to ED for further evaluation. PATP.

## 2011-02-20 NOTE — Progress Notes (Signed)
  Subjective:    Patient ID: Jacqueline [name], female    DOB: Jul 08, 1931, 77 y.o.   MRN: [id number]  HPI  Seen yesterday in [facility] with diarrrhea, nausea and abd discomfort.  Labs showed a bump in creat and a 22K WBC count.  Today, some better.  Still with diarrhea ~4stools per day and abd pain.  Tolerating fluids well.  Not [place] for solids. No fever or chills    Review of Systems     Objective:   Physical Exam Mild diffuse tenderness greatest in Lt lower quadrent.  No rebound.       Assessment & Plan:   Diarrhea Bump in Creat and WBC=22K suggests colitis or diverticulitis.  Taking PO fluids well, will try ambulatory treatment

## 2011-02-21 ENCOUNTER — Encounter (HOSPITAL_COMMUNITY): Payer: Self-pay | Admitting: Radiology

## 2011-02-21 DIAGNOSIS — K625 Hemorrhage of anus and rectum: Secondary | ICD-10-CM

## 2011-02-21 DIAGNOSIS — K5732 Diverticulitis of large intestine without perforation or abscess without bleeding: Secondary | ICD-10-CM

## 2011-02-21 LAB — CBC
HCT: 28 % — ABNORMAL LOW (ref 36.0–46.0)
HCT: 27.2 % — ABNORMAL LOW (ref 36.0–46.0)
Hemoglobin: 9.5 g/dL — ABNORMAL LOW (ref 12.0–15.0)
Hemoglobin: 9.6 g/dL — ABNORMAL LOW (ref 12.0–15.0)
MCH: 29.6 pg (ref 26.0–34.0)
MCHC: 34 g/dL (ref 30.0–36.0)
MCV: 86.7 fL (ref 78.0–100.0)
MCV: 86.6 fL (ref 78.0–100.0)
MCV: 87 fL (ref 78.0–100.0)
Platelets: 240 10*3/uL (ref 150–400)
RBC: 3.14 MIL/uL — ABNORMAL LOW (ref 3.87–5.11)
RBC: 3.55 MIL/uL — ABNORMAL LOW (ref 3.87–5.11)
RDW: 14.1 % (ref 11.5–15.5)
WBC: 6.7 10*3/uL (ref 4.0–10.5)
WBC: 4.7 10*3/uL (ref 4.0–10.5)

## 2011-02-21 LAB — FOLATE: Folate: >20 ng/mL

## 2011-02-21 LAB — TYPE AND SCREEN

## 2011-02-21 LAB — IRON AND TIBC
Saturation Ratios: 21 % (ref 20–55)
TIBC: 193 ug/dL — ABNORMAL LOW (ref 250–470)
UIBC: 153 ug/dL

## 2011-02-21 LAB — BASIC METABOLIC PANEL
BUN: 21 mg/dL (ref 6–23)
CO2: 22 mEq/L (ref 19–32)
Chloride: 106 mEq/L (ref 96–112)
Creatinine, Ser: 1.21 mg/dL — ABNORMAL HIGH (ref 0.50–1.10)
Glucose, Bld: 94 mg/dL (ref 70–99)
Potassium: 3.4 mEq/L — ABNORMAL LOW (ref 3.5–5.1)

## 2011-02-21 LAB — BASIC METABOLIC PANEL WITH GFR
Calcium: 10 mg/dL (ref 8.4–10.5)
GFR, Est African American: 43 mL/min — ABNORMAL LOW (ref >60)
Sodium: 140 mEq/L (ref 135–145)

## 2011-02-21 LAB — RETICULOCYTES: Retic Count, Absolute: 31.4 10*3/uL (ref 19.0–186.0)

## 2011-02-21 LAB — VITAMIN B12: Vitamin B-12: >2000 pg/mL — ABNORMAL HIGH (ref 211–911)

## 2011-02-21 MED ORDER — IOHEXOL 300 MG/ML  SOLN
80.0000 mL | Freq: Once | INTRAMUSCULAR | Status: AC | PRN
  Administered 2011-02-21: 80 mL via INTRAVENOUS

## 2011-02-22 LAB — CBC
HCT: 27.2 % — ABNORMAL LOW (ref 36.0–46.0)
HCT: 32.4 % — ABNORMAL LOW (ref 36.0–46.0)
Hemoglobin: 9.2 g/dL — ABNORMAL LOW (ref 12.0–15.0)
Hemoglobin: 10.9 g/dL — ABNORMAL LOW (ref 12.0–15.0)
MCH: 29.8 pg (ref 26.0–34.0)
MCH: 29.7 pg (ref 26.0–34.0)
MCHC: 33.8 g/dL (ref 30.0–36.0)
MCHC: 33.6 g/dL (ref 30.0–36.0)
MCV: 88 fL (ref 78.0–100.0)
MCV: 88.3 fL (ref 78.0–100.0)
Platelets: 206 K/uL (ref 150–400)
Platelets: 243 K/uL (ref 150–400)
Platelets: 242 10*3/uL (ref 150–400)
RBC: 3.09 MIL/uL — ABNORMAL LOW (ref 3.87–5.11)
RBC: 3.67 MIL/uL — ABNORMAL LOW (ref 3.87–5.11)
RDW: 14.2 % (ref 11.5–15.5)
RDW: 14 % (ref 11.5–15.5)
RDW: 13.9 % (ref 11.5–15.5)
WBC: 4.1 K/uL (ref 4.0–10.5)
WBC: 5.5 K/uL (ref 4.0–10.5)
WBC: 5.9 10*3/uL (ref 4.0–10.5)

## 2011-02-22 LAB — BASIC METABOLIC PANEL WITH GFR
BUN: 10 mg/dL (ref 6–23)
CO2: 22 meq/L (ref 19–32)
Calcium: 9.7 mg/dL (ref 8.4–10.5)
Chloride: 108 meq/L (ref 96–112)
Creatinine, Ser: 1.19 mg/dL — ABNORMAL HIGH (ref 0.50–1.10)
GFR calc Af Amer: 53 mL/min — ABNORMAL LOW
GFR calc non Af Amer: 44 mL/min — ABNORMAL LOW
Glucose, Bld: 100 mg/dL — ABNORMAL HIGH (ref 70–99)
Potassium: 3.7 meq/L (ref 3.5–5.1)
Sodium: 141 meq/L (ref 135–145)

## 2011-02-23 LAB — CBC
Hemoglobin: 9.7 g/dL — ABNORMAL LOW (ref 12.0–15.0)
RBC: 3.24 MIL/uL — ABNORMAL LOW (ref 3.87–5.11)
WBC: 4.8 10*3/uL (ref 4.0–10.5)

## 2011-02-25 ENCOUNTER — Encounter: Payer: Self-pay | Admitting: Family Medicine

## 2011-02-25 ENCOUNTER — Ambulatory Visit (INDEPENDENT_AMBULATORY_CARE_PROVIDER_SITE_OTHER): Payer: Medicare Other | Admitting: Family Medicine

## 2011-02-25 VITALS — BP 132/70 | Temp 98.4°F | Wt 149.0 lb

## 2011-02-25 DIAGNOSIS — D649 Anemia, unspecified: Secondary | ICD-10-CM

## 2011-02-25 DIAGNOSIS — I10 Essential (primary) hypertension: Secondary | ICD-10-CM

## 2011-02-25 DIAGNOSIS — E7800 Pure hypercholesterolemia, unspecified: Secondary | ICD-10-CM

## 2011-02-25 DIAGNOSIS — K5730 Diverticulosis of large intestine without perforation or abscess without bleeding: Secondary | ICD-10-CM

## 2011-02-25 DIAGNOSIS — M1990 Unspecified osteoarthritis, unspecified site: Secondary | ICD-10-CM

## 2011-02-25 MED ORDER — METOPROLOL TARTRATE 25 MG PO TABS: 25.0000 mg | ORAL_TABLET | Freq: Two times a day (BID) | ORAL | Status: DC

## 2011-02-25 MED ORDER — ALPRAZOLAM 0.5 MG PO TABS: 0.5000 mg | ORAL_TABLET | Freq: Three times a day (TID) | ORAL | Status: DC | PRN

## 2011-02-25 MED ORDER — SIMVASTATIN 80 MG PO TABS: 80.0000 mg | ORAL_TABLET | Freq: Every day | ORAL | Status: DC

## 2011-02-25 MED ORDER — ROPINIROLE HCL 0.25 MG PO TABS: 0.2500 mg | ORAL_TABLET | Freq: Three times a day (TID) | ORAL | Status: DC

## 2011-02-25 MED ORDER — OMEPRAZOLE 20 MG PO CPDR: 20.0000 mg | DELAYED_RELEASE_CAPSULE | Freq: Every day | ORAL | Status: DC

## 2011-02-25 NOTE — Progress Notes (Signed)
  Subjective:    Patient ID: Jacqueline [name], female    DOB: 1931/01/15, 77 y.o.   MRN: [id number]  HPI Here for both hospital FU and annual exam Hospitalized for rectal bleeding last week, presumed diverticular bleed.  No bleeding since discharge.  Also seen last week for abd pain and presumed colitis/diverticulitis.  All pain has resolved.  In hospital precribed daily miralax for chronic constipation.   Up-to-date on screening measures.     Review of Systems No chest pain syncope shortness of breath unusual bleeding beyond her recent rectal bleeding no change in bowel or bladder habits no swelling    Objective:   Physical Exam HEENT normal  Neck supple  Lungs clear  Cardiac regular rate and rhythm without murmur or gallop  Abdomen benign  Extremities normal  Neuro normal       Assessment & Plan:

## 2011-02-25 NOTE — Patient Instructions (Signed)
Please come back for fasting blood work - tomorrow if that suits you Do not take your enalapril or hydrochlorothiazide.  Don't throw these away.  I will likely restart if your blood pressure starts going up. Do keep a regular check on your blood pressure.  Call me if it is running more than 140/90. Let's wait 2 full weeks from the time you bled, before we restart your aspirin.

## 2011-02-26 ENCOUNTER — Other Ambulatory Visit: Payer: Medicare Other

## 2011-02-26 DIAGNOSIS — E7800 Pure hypercholesterolemia, unspecified: Secondary | ICD-10-CM

## 2011-02-26 DIAGNOSIS — D649 Anemia, unspecified: Secondary | ICD-10-CM

## 2011-02-26 LAB — LIPID PANEL
Cholesterol: 135 mg/dL (ref 0–200)
HDL: 40 mg/dL (ref >39)
Total CHOL/HDL Ratio: 3.4 Ratio
Triglycerides: 199 mg/dL — ABNORMAL HIGH (ref <150)
VLDL: 40 mg/dL (ref 0–40)

## 2011-02-26 LAB — CBC
HCT: 32.1 % — ABNORMAL LOW (ref 36.0–46.0)
MCHC: 31.8 g/dL (ref 30.0–36.0)
Platelets: 269 10*3/uL (ref 150–400)
RDW: 15 % (ref 11.5–15.5)
WBC: 4.6 10*3/uL (ref 4.0–10.5)

## 2011-02-26 NOTE — Assessment & Plan Note (Signed)
Check fasting labs 

## 2011-02-26 NOTE — Assessment & Plan Note (Signed)
Stable on current meds 

## 2011-02-26 NOTE — Progress Notes (Signed)
Cbc,flp and cbc done today [facility] Alexy Heldt

## 2011-02-26 NOTE — Assessment & Plan Note (Addendum)
Stable on current meds - perhaps even overtreated.  Continue to hold HCTZ and ACE home blood pressures, call in 2 weeks with readings thank you

## 2011-02-26 NOTE — Assessment & Plan Note (Signed)
Acute pain and bleeding now fully resolved.

## 2011-02-27 ENCOUNTER — Encounter: Payer: Self-pay | Admitting: Family Medicine

## 2011-02-27 LAB — COMPLETE METABOLIC PANEL WITH GFR
AST: 36 U/L (ref 0–37)
Alkaline Phosphatase: 61 U/L (ref 39–117)
BUN: 18 mg/dL (ref 6–23)
Calcium: 9.5 mg/dL (ref 8.4–10.5)
Chloride: 106 mEq/L (ref 96–112)
Creat: 1.11 mg/dL — ABNORMAL HIGH (ref 0.50–1.10)

## 2011-02-27 NOTE — Discharge Summary (Signed)
NAME:  Jacqueline [name], Jacqueline [name] NO.:  [id number]  MEDICAL RECORD NO.:  [id number]  LOCATION:  [facility]                         FACILITY:  [facility]  PHYSICIAN:  Blane Ohara Adelai Achey, M.D.DATE OF BIRTH:  1930-11-08  DATE OF ADMISSION:  02/20/2011 DATE OF DISCHARGE:  02/23/2011                              DISCHARGE SUMMARY   PRIMARY CARE PHYSICIAN:  Jamal Collin. Andria Frames, MD of [facility].  DISCHARGE DIAGNOSES PRIMARY: 1. Diverticulitis/abdominal pain. 2. Dehydration. 3. Gastrointestinal bleed/anemia.  SECONDARY DIAGNOSES: 1. Hypertension. 2. Hyperlipidemia. 3. Coronary artery disease. 4. Insomnia/anxiety.  DISCHARGE MEDICATIONS: 1. Xanax 0.5 mg 1 tablet by mouth 3 times a day as needed for anxiety. 2. Enalapril 10 mg 1 tablet by mouth twice daily. 3. Fish oil 2 tablets by mouth twice daily. 5. Hydrochlorothiazide 12.5 mg 1 tablet by mouth daily. 6. Metoprolol tartrate 25 mg 1 tablet by mouth twice daily. 7. Omeprazole 20 mg 1 tablet by mouth daily. 8. Ropinirole 0.5 mg 1 tablet by mouth 3 times a day. 9. Simvastatin 80 mg 1 tablet by mouth daily at bedtime. 10.Vitamin B12 over the counter 1000 mg 2 tablets by mouth twice     daily. 11.Vitamin D3, 1000 units over the counter 2 tablets by mouth twice     daily.  HOSPITAL COURSE:  Ms. Sagona is a 77 year old female with multiple medical problems, admitted for management of diverticulitis complicated by GI bleed. 1. Abdominal pain/diverticulitis - the patient was initially on     outpatient regimen of ciprofloxacin and Flagyl before     hospitalization.  When she was admitted on inpatient, she was     changed to IV Zosyn.  She completed a 5-day course of antibiotic     coverage for potential gastroenteritis.  It was thought that the     patient's abdominal pain was most likely due to diverticulitis as     she had a left lower quadrant pain despite a CT scan showing no     acute intraabdominal or pelvic  pathology on February 21, 2011, as the     patient displayed clinical symptomatology consistent with     diverticulitis.  The patient was initially hydrated, but as her     diet was advanced, her fluids were slowly drawn back.  She advanced     her diet to mechanical soft and tolerated this well with no nausea     or vomiting.  The patient initially had frequent loose stools, but     these decreased in frequency over the course of her     hospitalization. 2. Anemia/GI blood loss.  The patient's diverticulitis was complicated     by a GI bleed.  The patient was on aspirin at home and this     medication was held.  On the second day of admission, the patient     no longer complained of bloody stools.  She had been fecal occult     blood test positive before this time.  Serial hemoglobins were     checked.  The patient has a baseline hemoglobin of 11-12.  In view     of this hospitalization every  8 hours, her hemoglobins were stable     from 9s and 10s.  He had no signs of an acute active bleed. 3. Dehydration/increased creatinine.  The patient initially had an     elevated creatinine of 1.34, but this trended down over the course     of several days.  She will need to have a repeat BMP on an     outpatient basis to ensure that her creatinine has continued to     improve.  The patient was hydrated and this hopefully relieved her     problem. 4. Hypertension. well controlled on metoprolol,     enalapril, and hydrochlorothiazide. 5. Hyperlipidemia.  The patient was continued on statin. 6. CAD, stable without chest pain.  She is on statin.  Her aspirin was     held and she was instructed to discuss this with her primary care     physician once discharged. 7. Insomnia/anxiety.  The patient had her Xanax continued during her     hospitalization.  PROCEDURES:  CT scan on February 21, 2011, of abdomen and pelvis showed no acute intra-abdominal or pelvic pathology by CT.  It did show mildly lobulated  contour to renal parenchyma bilaterally unchanged from previous.  A 2-mm nonobstructing right mild renal calculus.  A 4-mm too- small-to-characterize left mid renal cortical hypodensity.  No radiopaque ureteral calculi.  Tiny fat-containing umbilical hernia noted.  Allowing for decompression of several segments of colon which may produce artifact for apparent wall thickening on CT, no segmental dilation, wall thickening, or pericolonic stranding is seen.  Appendix not visualized, but no signs of acute appendicitis.  Atherosclerotic vascular calcifications noted without aneurysm.  Degenerative disk changes noted.  Spondylolysis  of L1-L2 has progressed since the prior study.  LABORATORY DATA:  CBC on discharge; white count 4.8, hemoglobin 9.7, platelets 219.  BMP 141, 3.7, 108, 22, 110, 1.19.  Reticulocyte count 31.4, reticulocyte percentage 1.0.  Fecal occult blood positive.  Lipase 38.  LFTs within normal limits.  The patient's condition at the time of discharge - the patient was discharged home in stable condition.  DISPOSITION:  Home.  DISCHARGE FOLLOWUP:  With Dr. Madison Hickman of [facility] on February 25, 2011, at 1:30 p.m.  FOLLOWUP ISSUES: 1. Please advise the patient when to restart her aspirin. 2. Please repeat BMET to ensure that the patient's creatinine has     continued to trend down. 3. Please follow up to see if the patient has continued to have     resolution of her loose stools and abdominal pain.    ______________________________ Garret Reddish, MD   ______________________________ Blane Ohara Davita Sublett, M.D.    SH/MEDQ  D:  02/23/2011  T:  02/24/2011  Job:  [id number]  cc:   William A. Andria Frames, M.D.  Electronically Signed by Garret Reddish MD on 02/24/2011 06:49:02 PM Electronically Signed by Lissa Morales M.D. on 02/27/2011 02:37:55 PM

## 2011-02-27 NOTE — Progress Notes (Signed)
  Subjective:    Patient ID: Jacqueline [name], female    DOB: 10-23-1930, 77 y.o.   MRN: [id number]  HPI Reviewed results by phone.  Given very low LDL and concern for 80 mg simvastatin, will reduce to 40 mg.  Told to restart enalapril because her BP is creeping up.    Review of Systems     Objective:   Physical Exam        Assessment & Plan:

## 2011-02-27 NOTE — Progress Notes (Signed)
Addended by: Domenic Schwab on: 02/27/2011 08:49 AM   Modules accepted: Orders

## 2011-03-05 ENCOUNTER — Telehealth: Payer: Self-pay | Admitting: Family Medicine

## 2011-03-05 ENCOUNTER — Other Ambulatory Visit: Payer: Self-pay | Admitting: *Deleted

## 2011-03-05 DIAGNOSIS — I10 Essential (primary) hypertension: Secondary | ICD-10-CM

## 2011-03-05 MED ORDER — ENALAPRIL MALEATE 10 MG PO TABS: 10.0000 mg | ORAL_TABLET | Freq: Two times a day (BID) | ORAL | Status: DC

## 2011-03-05 NOTE — Telephone Encounter (Signed)
Needs refill on Enalapril since it was taken off and is now back on it. [facility]

## 2011-03-05 NOTE — Telephone Encounter (Signed)
Pt gave me her BP readings from 8/4-03/05/11 144/89, 132/72, 144/98, 140/80, 152/82. Refilled pt's Rx for Enalapril 10 mg. Looked back at pt's chart to be sure of Dr. Lowella Bandy after visit request for pt,.Jacqueline [name]

## 2011-03-19 ENCOUNTER — Other Ambulatory Visit: Payer: Self-pay | Admitting: Cardiovascular Disease

## 2011-03-19 ENCOUNTER — Ambulatory Visit
Admission: RE | Admit: 2011-03-19 | Discharge: 2011-03-19 | Disposition: A | Discharge status: Home / Self Care | Payer: Medicare Other | Source: Ambulatory Visit | Attending: Cardiovascular Disease | Admitting: Cardiovascular Disease

## 2011-03-19 DIAGNOSIS — R42 Dizziness and giddiness: Secondary | ICD-10-CM

## 2011-03-20 ENCOUNTER — Other Ambulatory Visit: Payer: Self-pay | Admitting: Cardiovascular Disease

## 2011-03-20 DIAGNOSIS — R42 Dizziness and giddiness: Secondary | ICD-10-CM

## 2011-03-25 ENCOUNTER — Encounter: Payer: Self-pay | Admitting: Internal Medicine

## 2011-04-02 NOTE — H&P (Signed)
NAME:  Jacqueline [name], Jacqueline [name] NO.:  [id number]  MEDICAL RECORD NO.:  [id number]  LOCATION:  [facility]                         FACILITY:  [facility]  PHYSICIAN:  Talbert Cage, M.D.DATE OF BIRTH:  18-Apr-1931  DATE OF ADMISSION:  02/20/2011 DATE OF DISCHARGE:                             HISTORY & PHYSICAL   PRIMARY CARE PROVIDER:  Jamal Collin. Andria Frames, MD, [place].  CHIEF COMPLAINT:  Diverticulitis.  HISTORY OF PRESENT ILLNESS:  This is a 77 year old female with past medical history of diverticular disease, presenting with 45-day history of abdominal pain and bloody stools.  The patient states that she noticed stools earlier in the week around approximately Friday.  The patient went to the [facility] on February 17, 2011, and was seen by Dr. Georgina Snell at that time, to which a CBC was done which showed no white count, but did show hemoglobin of 11.2 and the patient was given a tentative diagnosis of possible gastroenteritis versus diverticulitis with plan to follow up within 24 hours with her PCP.  The patient was then seen by her PCP, Dr. Andria Frames the next day to which the patient was diagnosed with diverticulitis at that time of placed on an oral regimen of Cipro and Flagyl.  The patient states that from this point that she has had no bloody stools since starting the Cipro and Flagyl (bloody stools were present initially on Monday).  However, the patient noticed one episode of voluminous bright red blood per rectum at home yesterday evening.  The patient also reports persistent abdominal pain throughout the entire process.  The patient called the after hours line about the bloody stools and the patient was advised to go to the emergency department for further evaluation.  The patient denies any fevers, nausea, vomiting.  The patient initially had diarrhea at the beginning of the week, but this has resolved per the patient.  The patient  states that the predominant symptom has been abdominal pain, has been persistent since the beginning of the week.  The patient states she has tolerates some p.o. intake.  Denies any nut intake.  The patient does report eating oatmeal throughout the week.  ALLERGIES:  ATORVASTATIN.  PAST MEDICAL HISTORY: 1. Hyperlipidemia. 2. Anemia. 3. Hypertension. 4. Coronary artery disease, status post CABG. 5. Allergic rhinitis. 6. Peptic ulcer disease. 7. Diverticulosis. 8. Osteoarthritis. 9. Chronic low back pain. 10.Vertigo. 11.Insomnia.  PAST SURGICAL HISTORY: 1. CABG in 2004. 2. Hysterectomy in 1978. 3. Bilateral hip replacement. 4. Bilateral rotator cuff repair.  MEDICATIONS: 1. Vitamin D. 2. Fish oil. 3. Vitamin B12 2 grams p.o. b.i.d. 4. Geritol. 5. Aspirin 81 mg p.o. daily. 6. Omeprazole 20 mg p.o. daily. 7. Xanax 0.5 mg p.o. t.i.d. 8. Ropinirole 0.25 mg p.o. t.i.d. 9. HCTZ 12.5 mg p.o. daily. 10.Simvastatin 80 mg p.o. daily. 11.Enalapril 10 mg p.o. daily. 12.Lopressor 25 mg p.o. daily.  SOCIAL HISTORY:  The patient currently lives alone.  Has 2 younger sons, both of who are adult in addition to the spouse who sometimes help with daily activities at home.  The patient denies any tobacco, alcohol or drug use.  The patient reports her son  Dwayne being healthcare power of attorney in the case of the patient being sick.  PHYSICAL EXAMINATION:  VITAL SIGNS:  Temperature 99.6, heart rate 80, respirations 19, blood pressure 166/72, satting 95% on room air. GENERAL:  The patient is in bed in no acute distress. HEENT:  Normocephalic, atraumatic.  Extraocular movement intact. Dentures in place. CARDIOVASCULAR:  Regular rate and rhythm.  No rubs, gallops, or murmurs auscultated. PULMONARY:  Clear to auscultation bilaterally.  No wheezes, rales, or rhonchi. ABDOMEN:  Positive tenderness palpation in the lower abdomen diffusely. Positive bowel sounds. EXTREMITIES:  2+  peripheral pulses.  No edema.  Distal lower extremities moderately cool to touch. SKIN:  Mild pallor. RECTAL:  No external hemorrhoids or fissures noted; positive palpable internal hemorrhoids diffusely.  LAB AND STUDIES: 1. CBC with a white count 6.9, hemoglobin 10.1, hematocrit 29.1,     platelet count of 222. 2. CMET:  Sodium 141, potassium 3.8, chloride 106, CO2 of 24, BUN 27,     creatinine 1.34, glucose 129.  Total protein 6.8, albumin 3.3, AST     27, ALT 20, albumin 3.3, calcium 9.6. 3. Lipase of 38. 4. Fecal occult blood positive. 5. CT of abdomen and pelvis showing no acute intra-abdominal or pelvic     pathology.  ASSESSMENT/PLAN:  This is a 77 year old female with past medical history of diverticulosis, presenting with abdominal pain and concern for diverticulitis, 1. Abdominal pain/diverticulitis.  The patient has had tentatively     failed outpatient management with p.o. Cipro and Flagyl.  We will     start the patient on IV Zosyn for inpatient coverage.  We will keep     the patient n.p.o.  We will advance diet slowly as tolerated.  We     will also check q.8 h. CBC to assess for worsening blood loss.     Given current hemodynamic stability as well as stable hemoglobin,     we will hold on packed red cells transfusion as this is currently     not indicated.  We will also place on high-dose PPI given history     of peptic ulcer disease.  We will hold on aspirin use. 2. Dehydration.  The patient has noted BUN/creatinine ratio of 21 in     the setting of persistent abdominal pain and decreased p.o. intake     over the last week with notable cool distal extremities.  The     patient is status post 1.5 liters in the ED.  We will continue the     patient on D5 half-normal saline at maintenance IV rate.  We will     also hold ACE inhibitor.  The patient did not have any hyperkalemia     which is reassuring.  We will continue to follow closely. 3. Anemia.  The patient has a  baseline history of anemia.  In review     of the patient's chart, the patient has lost approximately 1- 1.5     grams of blood over the last week with likely base hemoglobin     around 11-12.  Hemoglobin today is 10.1.  I do anticipate     delusional effect from IV fluids on the next BMET, though if     hemoglobin is significantly down, may consider transfusion if     active bleeding is still noted. 4. Hypertension, elevated.  The patient has not had her p.m.     Lopressor.  We will restart home beta-blocker and HCTZ.  We will     hold ACE inhibitor in the setting of dehydration as previously     described above. 5. Hyperlipidemia.  Continue home meds. 6. Insomnia, anxiety.  Continue home Xanax. 7. Coronary artery disease.  Continue to be stable.  No chest pain.     Continue home meds. 8. FEN/GI, n.p.o. with D5 half-normal saline at 125 an hour. 9. Prophylaxis.  High-dose PPI and SCDs. 10.Disposition.  Pending further evaluation.     Shanda Howells, MD   ______________________________ Talbert Cage, M.D.    SN/MEDQ  D:  02/21/2011  T:  02/21/2011  Job:  [id number]  Electronically Signed by Shanda Howells  on 03/16/2011 02:41:59 PM Electronically Signed by Talbert Cage M.D. on 04/02/2011 01:59:47 PM

## 2011-05-01 ENCOUNTER — Ambulatory Visit (INDEPENDENT_AMBULATORY_CARE_PROVIDER_SITE_OTHER): Payer: Medicare Other | Admitting: *Deleted

## 2011-05-01 DIAGNOSIS — Z23 Encounter for immunization: Secondary | ICD-10-CM

## 2011-05-11 ENCOUNTER — Other Ambulatory Visit: Payer: Self-pay | Admitting: Family Medicine

## 2011-05-12 NOTE — Telephone Encounter (Signed)
Refill request

## 2011-06-23 ENCOUNTER — Encounter: Payer: Self-pay | Admitting: Home Health Services

## 2011-06-30 ENCOUNTER — Other Ambulatory Visit: Payer: Self-pay | Admitting: Family Medicine

## 2011-06-30 NOTE — Telephone Encounter (Signed)
Refill request

## 2011-07-22 ENCOUNTER — Encounter: Payer: Self-pay | Admitting: Family Medicine

## 2011-07-22 DIAGNOSIS — I509 Heart failure, unspecified: Secondary | ICD-10-CM | POA: Insufficient documentation

## 2011-07-22 DIAGNOSIS — I519 Heart disease, unspecified: Secondary | ICD-10-CM

## 2011-07-22 DIAGNOSIS — I2510 Atherosclerotic heart disease of native coronary artery without angina pectoris: Secondary | ICD-10-CM

## 2011-07-22 NOTE — Progress Notes (Signed)
  Subjective:    Patient ID: Jacqueline [name], female    DOB: 1931-04-24, 77 y.o.   MRN: [id number]  HPI Received results of gated stress test and echo from [facility] Gated stress test 07/15/11 Low risk scan with normal EF Echo stage 1 dystolic dysfunction CHF    Review of Systems     Objective:   Physical Exam        Assessment & Plan:

## 2011-08-04 ENCOUNTER — Encounter: Payer: Self-pay | Admitting: Family Medicine

## 2011-10-12 ENCOUNTER — Ambulatory Visit (INDEPENDENT_AMBULATORY_CARE_PROVIDER_SITE_OTHER): Payer: Medicare Other | Admitting: Emergency Medicine

## 2011-10-12 ENCOUNTER — Encounter: Payer: Self-pay | Admitting: Emergency Medicine

## 2011-10-12 VITALS — BP 116/77 | HR 60 | Temp 98.1°F | Ht 62.0 in | Wt 150.0 lb

## 2011-10-12 DIAGNOSIS — J069 Acute upper respiratory infection, unspecified: Secondary | ICD-10-CM | POA: Insufficient documentation

## 2011-10-12 NOTE — Progress Notes (Signed)
  Subjective:    Patient ID: Jacqueline [name], female    DOB: 04-11-1931, 78 y.o.   MRN: [id number]  HPI Jacqueline [name] is here today for a SDA (at family's insistence) for cough and congestion.  She developed a productive cough about 2 weeks ago associated with rhinorrhea and feeling tired.  One week ago she developed some throat itchiness that progressed to hoarseness that has stayed about the same.  Also complains of ears popping when she blows her nose.  Otherwise feels okay, no fevers, n/v/d, SOB, sore throat, or sick contacts.  No increase in reflux symptoms.  No history of lung disease.  Taking Alka-Seltzer Cold and vitamin C cough drops at home with some relief.    Review of Systems See HPI.    Objective:   Physical Exam BP 116/77  Pulse 60  Temp(Src) 98.1 F (36.7 C) (Oral)  Ht 5\' 2"  (1.575 m)  Wt 150 lb (68.04 kg)  BMI 27.44 kg/m2 Gen: alert, cooperative, NAD, voice hoarse HEENT: AT/[place], PERRL, sclera white with no conjunctival injection, nasal mucous normal, no pharyngeal erythema or exudate; TMs clear bilaterally Neck: supple, no LAD CV: RRR, no murmurs Pulm: CTAB, no wheezes, rales, or rhonchi     Assessment & Plan:

## 2011-10-12 NOTE — Patient Instructions (Signed)
It was nice to meet you!  I think you're right...you have a cold.  Keep taking the Alka-Seltzer and vitamin C drops.  You should start to feel better in the next week.  If you develop fevers, difficulty breathing, or trouble getting fluids down, please come back.

## 2011-10-12 NOTE — Assessment & Plan Note (Signed)
Discussed expected time course and symptomatic treatment.  Warning signs given.  Patient expressed agreement and understanding.

## 2011-10-30 ENCOUNTER — Ambulatory Visit (INDEPENDENT_AMBULATORY_CARE_PROVIDER_SITE_OTHER): Payer: Medicare Other | Admitting: Family Medicine

## 2011-10-30 ENCOUNTER — Encounter: Payer: Self-pay | Admitting: Family Medicine

## 2011-10-30 VITALS — BP 106/72 | HR 72 | Temp 97.8°F | Resp 12 | Ht 62.0 in | Wt 149.0 lb

## 2011-10-30 DIAGNOSIS — G4700 Insomnia, unspecified: Secondary | ICD-10-CM

## 2011-10-30 DIAGNOSIS — M1990 Unspecified osteoarthritis, unspecified site: Secondary | ICD-10-CM

## 2011-10-30 DIAGNOSIS — M545 Low back pain: Secondary | ICD-10-CM

## 2011-10-30 MED ORDER — TRAMADOL HCL 50 MG PO TABS: 50.0000 mg | ORAL_TABLET | Freq: Four times a day (QID) | ORAL | Status: AC

## 2011-10-30 MED ORDER — TRAZODONE HCL 50 MG PO TABS: 50.0000 mg | ORAL_TABLET | Freq: Every day | ORAL | Status: DC

## 2011-10-30 NOTE — Assessment & Plan Note (Signed)
Avoid long acting benzo.  Try trazadone.

## 2011-10-30 NOTE — Assessment & Plan Note (Signed)
Progression of chronic osteo.  Will try regular dose tramadol

## 2011-10-30 NOTE — Progress Notes (Signed)
  Subjective:    Patient ID: Jacqueline [name], female    DOB: 1930-12-13, 78 y.o.   MRN: [id number]  HPI  Two issues: lower thoracic and lumbar back pain.  Chronic progressive.  Until now, she has only taken OTC meds.  Now feels she needs something stronger.    Considerable insomnia.  Has been taking her alprazolam without benefit.  No real anxiety or depression.  Seems to be isolated insomnia    Review of Systems     Objective:   Physical Exam No mass or palpable abnormality in the thoracic or lumbar region beyond tender paraspinous muscles.  Mild limitation in ROM of lumbar spine.   Abd benign Ext normal leg strength and sensation.        Assessment & Plan:

## 2011-10-30 NOTE — Patient Instructions (Signed)
The tramadol is your pain medicine.  I want you to take it regularly 3 or four times per day every day.  You can take tylenol on top of that on bad pain days. The sleeping medicine is trazodone and can be taken with the pain medicine and alprazolam.  Be especially careful the first few nights to make sure you are not too groggy when you get up to pee. I doubt that I will get your medicines perfect the first time.  See me in a month or two and we will make adjustments.

## 2011-10-30 NOTE — Assessment & Plan Note (Signed)
See low back pain

## 2011-12-04 ENCOUNTER — Telehealth: Payer: Self-pay | Admitting: Family Medicine

## 2011-12-04 DIAGNOSIS — M1990 Unspecified osteoarthritis, unspecified site: Secondary | ICD-10-CM

## 2011-12-04 NOTE — Telephone Encounter (Signed)
Pt states that the trazadone is not helping like she wanted - still having pain and cramps in her legs and can't sleep.  Isn't sure if he needs to increase dose or change it.  pls advise

## 2011-12-07 MED ORDER — HYDROCODONE-ACETAMINOPHEN 5-500 MG PO CAPS: 1.0000 | ORAL_CAPSULE | Freq: Three times a day (TID) | ORAL | Status: DC

## 2011-12-07 NOTE — Telephone Encounter (Signed)
States primary problem is pain which is keeping her up at nights.  Ultram is not helpful.  Will increase to hydrocodone.  Continue Trazodone.  Rx called in.

## 2011-12-07 NOTE — Assessment & Plan Note (Signed)
Ultram not helping.

## 2012-01-15 ENCOUNTER — Other Ambulatory Visit: Payer: Self-pay | Admitting: Family Medicine

## 2012-01-15 DIAGNOSIS — Z1231 Encounter for screening mammogram for malignant neoplasm of breast: Secondary | ICD-10-CM

## 2012-01-26 ENCOUNTER — Other Ambulatory Visit: Payer: Self-pay | Admitting: Family Medicine

## 2012-01-26 MED ORDER — ALPRAZOLAM 0.5 MG PO TABS: 0.5000 mg | ORAL_TABLET | Freq: Every evening | ORAL | Status: DC | PRN

## 2012-01-26 NOTE — Progress Notes (Signed)
Received refill request for alprazolam. At last visit, noted to be needing benzodiazepine for insomnia.  Will refill for #30.

## 2012-01-27 ENCOUNTER — Other Ambulatory Visit: Payer: Self-pay | Admitting: *Deleted

## 2012-01-27 DIAGNOSIS — G4700 Insomnia, unspecified: Secondary | ICD-10-CM

## 2012-01-27 MED ORDER — LORATADINE 10 MG PO TABS: 10.0000 mg | ORAL_TABLET | Freq: Every day | ORAL | Status: DC

## 2012-01-27 MED ORDER — TRAZODONE HCL 50 MG PO TABS: 50.0000 mg | ORAL_TABLET | Freq: Every day | ORAL | Status: DC

## 2012-02-04 ENCOUNTER — Ambulatory Visit
Admission: RE | Admit: 2012-02-04 | Discharge: 2012-02-04 | Disposition: A | Discharge status: Home / Self Care | Payer: Medicare Other | Source: Ambulatory Visit | Attending: Family Medicine | Admitting: Family Medicine

## 2012-02-04 DIAGNOSIS — Z1231 Encounter for screening mammogram for malignant neoplasm of breast: Secondary | ICD-10-CM

## 2012-02-21 DIAGNOSIS — K5792 Diverticulitis of intestine, part unspecified, without perforation or abscess without bleeding: Secondary | ICD-10-CM

## 2012-02-21 HISTORY — DX: Diverticulitis of intestine, part unspecified, without perforation or abscess without bleeding: K57.92

## 2012-02-23 ENCOUNTER — Other Ambulatory Visit: Payer: Self-pay | Admitting: Family Medicine

## 2012-02-23 MED ORDER — ALPRAZOLAM 0.5 MG PO TABS: 0.5000 mg | ORAL_TABLET | Freq: Every evening | ORAL | Status: DC | PRN

## 2012-03-04 ENCOUNTER — Ambulatory Visit (INDEPENDENT_AMBULATORY_CARE_PROVIDER_SITE_OTHER): Payer: Medicare Other | Admitting: Family Medicine

## 2012-03-04 ENCOUNTER — Telehealth: Payer: Self-pay | Admitting: Family Medicine

## 2012-03-04 ENCOUNTER — Encounter: Payer: Self-pay | Admitting: Family Medicine

## 2012-03-04 VITALS — BP 142/78 | HR 88 | Temp 98.7°F | Ht 61.0 in | Wt 146.7 lb

## 2012-03-04 DIAGNOSIS — D649 Anemia, unspecified: Secondary | ICD-10-CM

## 2012-03-04 DIAGNOSIS — G4700 Insomnia, unspecified: Secondary | ICD-10-CM

## 2012-03-04 DIAGNOSIS — E7800 Pure hypercholesterolemia, unspecified: Secondary | ICD-10-CM

## 2012-03-04 DIAGNOSIS — Z23 Encounter for immunization: Secondary | ICD-10-CM

## 2012-03-04 DIAGNOSIS — M545 Low back pain, unspecified: Secondary | ICD-10-CM

## 2012-03-04 DIAGNOSIS — Z0000 Encounter for general adult medical examination without abnormal findings: Secondary | ICD-10-CM | POA: Insufficient documentation

## 2012-03-04 DIAGNOSIS — K2100 Gastro-esophageal reflux disease with esophagitis, without bleeding: Secondary | ICD-10-CM

## 2012-03-04 DIAGNOSIS — I10 Essential (primary) hypertension: Secondary | ICD-10-CM

## 2012-03-04 DIAGNOSIS — I2510 Atherosclerotic heart disease of native coronary artery without angina pectoris: Secondary | ICD-10-CM

## 2012-03-04 DIAGNOSIS — I519 Heart disease, unspecified: Secondary | ICD-10-CM

## 2012-03-04 LAB — COMPLETE METABOLIC PANEL WITH GFR
AST: 16 U/L (ref 0–37)
Albumin: 4.1 g/dL (ref 3.5–5.2)
BUN: 27 mg/dL — ABNORMAL HIGH (ref 6–23)
CO2: 28 mEq/L (ref 19–32)
Calcium: 9.7 mg/dL (ref 8.4–10.5)
Chloride: 104 mEq/L (ref 96–112)
Creat: 1.41 mg/dL — ABNORMAL HIGH (ref 0.50–1.10)
GFR, Est African American: 41 mL/min — ABNORMAL LOW
Potassium: 4.4 mEq/L (ref 3.5–5.3)

## 2012-03-04 LAB — LIPID PANEL
Cholesterol: 140 mg/dL (ref 0–200)
Triglycerides: 111 mg/dL (ref <150)

## 2012-03-04 LAB — CBC
MCH: 30 pg (ref 26.0–34.0)
MCHC: 34 g/dL (ref 30.0–36.0)
Platelets: 248 10*3/uL (ref 150–400)
RDW: 13.7 % (ref 11.5–15.5)

## 2012-03-04 MED ORDER — ENALAPRIL MALEATE 10 MG PO TABS: 10.0000 mg | ORAL_TABLET | Freq: Two times a day (BID) | ORAL | Status: DC

## 2012-03-04 MED ORDER — SIMVASTATIN 40 MG PO TABS: 40.0000 mg | ORAL_TABLET | Freq: Every day | ORAL | Status: DC

## 2012-03-04 MED ORDER — ALPRAZOLAM 0.5 MG PO TABS: 0.5000 mg | ORAL_TABLET | Freq: Every evening | ORAL | Status: DC | PRN

## 2012-03-04 MED ORDER — METOPROLOL TARTRATE 25 MG PO TABS: 25.0000 mg | ORAL_TABLET | Freq: Two times a day (BID) | ORAL | Status: DC

## 2012-03-04 MED ORDER — LORATADINE 10 MG PO TABS: 10.0000 mg | ORAL_TABLET | Freq: Every day | ORAL | Status: DC

## 2012-03-04 MED ORDER — OMEPRAZOLE 20 MG PO CPDR: 20.0000 mg | DELAYED_RELEASE_CAPSULE | Freq: Every day | ORAL | Status: DC

## 2012-03-04 MED ORDER — HYDROCHLOROTHIAZIDE 12.5 MG PO CAPS: 12.5000 mg | ORAL_CAPSULE | ORAL | Status: DC

## 2012-03-04 MED ORDER — PNEUMOCOCCAL VAC POLYVALENT 25 MCG/0.5ML IJ INJ: 0.5000 mL | INJECTION | Freq: Once | INTRAMUSCULAR | Status: DC

## 2012-03-04 MED ORDER — POLYETHYLENE GLYCOL 3350 17 GM/SCOOP PO POWD: 17.0000 g | Freq: Every day | ORAL | Status: DC

## 2012-03-04 MED ORDER — ROPINIROLE HCL 0.25 MG PO TABS: 0.2500 mg | ORAL_TABLET | Freq: Three times a day (TID) | ORAL | Status: DC

## 2012-03-04 MED ORDER — TRAZODONE HCL 50 MG PO TABS: 50.0000 mg | ORAL_TABLET | Freq: Every day | ORAL | Status: DC

## 2012-03-04 MED ORDER — ZOSTER VACCINE LIVE 19400 UNT/0.65ML ~~LOC~~ SOLR: 0.6500 mL | Freq: Once | SUBCUTANEOUS | Status: AC

## 2012-03-04 NOTE — Telephone Encounter (Signed)
Wanted to let Dr Andria Frames know that she had her shingles shot and pneumovax shot today

## 2012-03-04 NOTE — Patient Instructions (Addendum)
Great job on weight Blood pressure is a little high today.  Check it once a week for the next month and call me.  We can decide based on those results whether to increase your medications. I sent three month supply of all meds plus a prescription for the shingles vaccine.  Call me when you get the vaccine so that I can update your chart.   I will call with blood work results. You also got a pneumonia vaccine today.  Both the shingles and pneumonia vaccine are done forever. You will still need a flu shot every year and a tetanus shot every 10 years.  Your last tetanus shot was June 2009

## 2012-03-07 ENCOUNTER — Encounter: Payer: Self-pay | Admitting: Family Medicine

## 2012-03-07 NOTE — Telephone Encounter (Signed)
Documented these in her chart.

## 2012-03-08 NOTE — Assessment & Plan Note (Signed)
Well controled on Aleve.  Check renal function with NSAID

## 2012-03-08 NOTE — Assessment & Plan Note (Signed)
Asymptomatic. 

## 2012-03-08 NOTE — Assessment & Plan Note (Signed)
Check labs 

## 2012-03-08 NOTE — Progress Notes (Signed)
  Subjective:    Patient ID: Jacqueline [name], female    DOB: 14-Jun-1931, 78 y.o.   MRN: [id number]  HPI  Patient in for annual exam.  She feels great.  She has found that OTC Aleve helps her chronic back pain  Denies any GI upset.   She is eating well and her weight is the best it has ever been.  She has been more active now that her back pain is better controled. Discussed HPDP.  Is willing to get zostavax and pneumovax.  She is otherwise up to date. She is due for labs    Review of Systems Denies chest pain SOB, bleeding, change in weight bowel or bladder.    Objective:   Physical ExamHEENT normal Neck supple Lungs clear Cardiac RRR without m or g abd benign Ext no edema Neuro motor and sensory grossly intact.       Assessment & Plan:  Wants 90 day refills on all meds.

## 2012-03-08 NOTE — Assessment & Plan Note (Signed)
Seems both happy and healthy

## 2012-03-08 NOTE — Assessment & Plan Note (Signed)
Well controled on meds but becomes symptomatic when she misses a dose.

## 2012-03-08 NOTE — Assessment & Plan Note (Signed)
>>  ASSESSMENT AND PLAN FOR REFLUX ESOPHAGITIS WRITTEN ON 03/08/2012 10:55 AM BY HENSEL, Richrd Prime, MD  Well controled on meds but becomes symptomatic when she misses a dose.

## 2012-03-08 NOTE — Assessment & Plan Note (Signed)
Suboptimal control.  Check home bps and adjust meds as indicated.

## 2012-03-15 ENCOUNTER — Telehealth: Payer: Self-pay | Admitting: Family Medicine

## 2012-03-15 DIAGNOSIS — G4700 Insomnia, unspecified: Secondary | ICD-10-CM

## 2012-03-15 MED ORDER — ALPRAZOLAM 0.5 MG PO TABS: 0.5000 mg | ORAL_TABLET | Freq: Two times a day (BID) | ORAL | Status: DC

## 2012-03-15 NOTE — Assessment & Plan Note (Signed)
Had been on tid alprazolam.  Decreased to qhs when I was out of town.  Anxious and out of sorts.  Will increase to bid.

## 2012-03-15 NOTE — Telephone Encounter (Signed)
Patient is calling wanting a refill on Alprazolam.  She has been trying to take 1 a day, but 1 isn't enough so now she only has 1 left and she really wants to speak to the nurse or Dr. Andria Frames about that and symptoms.

## 2012-03-25 ENCOUNTER — Telehealth: Payer: Self-pay | Admitting: Family Medicine

## 2012-03-25 DIAGNOSIS — I10 Essential (primary) hypertension: Secondary | ICD-10-CM

## 2012-03-25 NOTE — Telephone Encounter (Signed)
Jacqueline [name] called in her bp numbers and cancelled appt for Wednesday.   8/12   9:00 am    132/76  8/19  "        "     151/76  8/26  "       "          132/74  8/30  "      "            126/71

## 2012-03-29 NOTE — Assessment & Plan Note (Signed)
By home BP readings, BP now appears well controled

## 2012-03-30 ENCOUNTER — Ambulatory Visit: Payer: Medicare Other | Admitting: Family Medicine

## 2012-04-14 ENCOUNTER — Encounter: Payer: Self-pay | Admitting: Internal Medicine

## 2012-04-19 ENCOUNTER — Ambulatory Visit (INDEPENDENT_AMBULATORY_CARE_PROVIDER_SITE_OTHER): Payer: Medicare Other | Admitting: *Deleted

## 2012-04-19 DIAGNOSIS — Z23 Encounter for immunization: Secondary | ICD-10-CM

## 2012-06-15 ENCOUNTER — Other Ambulatory Visit: Payer: Self-pay | Admitting: Family Medicine

## 2012-07-27 HISTORY — PX: CATARACT EXTRACTION: SUR2

## 2012-09-14 ENCOUNTER — Other Ambulatory Visit: Payer: Self-pay | Admitting: Family Medicine

## 2012-09-16 ENCOUNTER — Telehealth: Payer: Self-pay | Admitting: Family Medicine

## 2012-09-16 NOTE — Telephone Encounter (Signed)
Patient states she has cold symptoms that started 2 days ago. Temp yesterday was 99.3. Has  much coughing  And expectorating light green colored sputum. Drinking fluids well. Has been taking Alka Seltez cold and flu. Recommended she not take that and try Mucinex. Advised tylenol instead of the [place]. Advised patient that if she is worsening or feels like she needs to be seen today go to Urgent Care today. She voices understanding.

## 2012-09-16 NOTE — Telephone Encounter (Signed)
Patient is having cold/flu sx with a slightly elevated temp. Would like to see if she could be worked in today.

## 2012-10-14 ENCOUNTER — Other Ambulatory Visit: Payer: Self-pay | Admitting: Family Medicine

## 2013-01-23 ENCOUNTER — Telehealth: Payer: Self-pay | Admitting: Family Medicine

## 2013-01-23 ENCOUNTER — Encounter: Payer: Self-pay | Admitting: Family Medicine

## 2013-01-23 ENCOUNTER — Ambulatory Visit (INDEPENDENT_AMBULATORY_CARE_PROVIDER_SITE_OTHER): Payer: Medicare Other | Admitting: Family Medicine

## 2013-01-23 VITALS — BP 187/77 | HR 60 | Temp 98.7°F | Ht 61.0 in | Wt 153.0 lb

## 2013-01-23 DIAGNOSIS — L538 Other specified erythematous conditions: Secondary | ICD-10-CM

## 2013-01-23 DIAGNOSIS — L304 Erythema intertrigo: Secondary | ICD-10-CM

## 2013-01-23 DIAGNOSIS — I10 Essential (primary) hypertension: Secondary | ICD-10-CM

## 2013-01-23 MED ORDER — KETOCONAZOLE 2 % EX CREA: TOPICAL_CREAM | Freq: Two times a day (BID) | CUTANEOUS | Status: DC

## 2013-01-23 MED ORDER — HYDROCHLOROTHIAZIDE 12.5 MG PO CAPS: 25.0000 mg | ORAL_CAPSULE | ORAL | Status: DC

## 2013-01-23 NOTE — Patient Instructions (Addendum)
It was nice seeing you today.  I have prescribed a topical cream for your itching/rash.  Please take it as prescribed.  I have increased one of your BP medications (HCTZ; I increased it to 25 mg daily).  Please be sure to follow up with Dr. Andria Frames as needed.

## 2013-01-23 NOTE — Assessment & Plan Note (Signed)
BP elevated today (180/77).  Will increase HCTZ to 25 mg daily.

## 2013-01-23 NOTE — Telephone Encounter (Signed)
Patient has a yeast infection since Saturday. She bought OTC medicine and it helps but not a lot. She is wanting something called in for her.JW

## 2013-01-23 NOTE — Progress Notes (Signed)
Subjective:     Patient ID: Jacqueline [name], female   DOB: October 22, 1930, 79 y.o.   MRN: [id number]  HPI 79 year old female presents for evaluation of vaginal itching.  1) Vaginal itching - Patient reports vaginal itching since Saturday (6/28). - Patient states that she was [place] the beach when the itching developed.  - She notes redness/irritation of the area. - She has applied vagisil cream with no improvement in itching. - Patient also notes itching underneath both breasts.  She has been applying cortisone to this region with no relief.  Review of Systems Per HPI with the following additions: No vaginal discharge.    Objective:   Physical Exam Filed Vitals:   01/23/13 1539  BP: 187/77  Pulse: 60  Temp: 98.7 F (37.1 C)  Exam: General: well appearing, NAD. Cardiovascular: RRR. No murmurs, rubs, or gallops. Respiratory: CTAB. No rales, rhonchi, or wheeze. Abdomen: soft, nontender, nondistended. Extremities: warm, well perfused. No LE edema. Skin: erythematous rash noted on labia and surrounding area including the perianal region.  Erythematous rash also noted underneath both breasts.       Assessment:     See Prob list    Plan:

## 2013-01-23 NOTE — Assessment & Plan Note (Addendum)
Itching is secondary to intertrigo from yeast and/or dermatophyte. Will send Rx for ketoconazole cream x 14 days.

## 2013-01-23 NOTE — Telephone Encounter (Signed)
Pt reports extreme itching in vaginal area with little relief from OTC products. Will come in for appointment this afternoon at [address] RN-BSN

## 2013-02-02 ENCOUNTER — Other Ambulatory Visit: Payer: Self-pay | Admitting: Family Medicine

## 2013-02-16 ENCOUNTER — Other Ambulatory Visit: Payer: Self-pay | Admitting: Family Medicine

## 2013-02-23 ENCOUNTER — Other Ambulatory Visit: Payer: Self-pay | Admitting: Family Medicine

## 2013-03-03 ENCOUNTER — Other Ambulatory Visit: Payer: Self-pay | Admitting: Family Medicine

## 2013-03-08 ENCOUNTER — Encounter: Payer: Self-pay | Admitting: Family Medicine

## 2013-03-08 ENCOUNTER — Ambulatory Visit (INDEPENDENT_AMBULATORY_CARE_PROVIDER_SITE_OTHER): Payer: Medicare Other | Admitting: Family Medicine

## 2013-03-08 VITALS — BP 138/80 | HR 65 | Temp 98.8°F | Ht 61.0 in | Wt 150.6 lb

## 2013-03-08 DIAGNOSIS — Z0000 Encounter for general adult medical examination without abnormal findings: Secondary | ICD-10-CM

## 2013-03-08 DIAGNOSIS — I519 Heart disease, unspecified: Secondary | ICD-10-CM

## 2013-03-08 DIAGNOSIS — I10 Essential (primary) hypertension: Secondary | ICD-10-CM

## 2013-03-08 DIAGNOSIS — E7800 Pure hypercholesterolemia, unspecified: Secondary | ICD-10-CM

## 2013-03-08 LAB — CBC
HCT: 35.1 % — ABNORMAL LOW (ref 36.0–46.0)
Hemoglobin: 11.3 g/dL — ABNORMAL LOW (ref 12.0–15.0)
MCH: 29.2 pg (ref 26.0–34.0)
MCHC: 32.2 g/dL (ref 30.0–36.0)

## 2013-03-08 LAB — LIPID PANEL
LDL Cholesterol: 79 mg/dL (ref 0–99)
VLDL: 36 mg/dL (ref 0–40)

## 2013-03-08 NOTE — Patient Instructions (Addendum)
As we discussed, I do not feel strongly about you getting another bone density test this year.  You should get a mammogram at least once every two years as long as you're healthy. Of course, get a flu shot this fall.  We give them.  If you get it elsewhere, call me so I can update the records.  I will call you with the results of the blood work.   As long as you feel great, keep an eye on your blood and the blood pressure is running good, come in to see me once a year.

## 2013-03-08 NOTE — Progress Notes (Signed)
  Subjective:    Patient ID: Jacqueline [name], female    DOB: 27-Mar-1931, 79 y.o.   MRN: [id number]  HPI Feels great.  Still lives alone (family is very close by).  Still works two days a week.  Has pharm call when needs refills.  Wants three month supply. No complaints.  Largely up to date on HPDP measures.  Does need mammo (healthy so mammo past age 21.)  Will not do bone density - good 4 years ago.   Home BPs running great, brings meter in and I confirm with memory.  Due for cholesterol    Review of Systems No chest pain, SOB, weight change, bleeding, appetite change. Does have intermitant slight bilateral ankle swelling.     Objective:   Physical ExamHEENT nl Neck supple  Lungs clear Cardiac 1/6 SEM no g Abd benign Ext normal pulses, no edema.        Assessment & Plan:

## 2013-03-08 NOTE — Assessment & Plan Note (Signed)
Very healthy

## 2013-03-08 NOTE — Assessment & Plan Note (Signed)
Asymptomatic. 

## 2013-03-08 NOTE — Assessment & Plan Note (Signed)
Needs check

## 2013-03-08 NOTE — Assessment & Plan Note (Signed)
Excellent control.   

## 2013-03-09 LAB — COMPLETE METABOLIC PANEL WITH GFR
ALT: 14 U/L (ref 0–35)
AST: 20 U/L (ref 0–37)
Albumin: 4.2 g/dL (ref 3.5–5.2)
Calcium: 9.7 mg/dL (ref 8.4–10.5)
Chloride: 101 mEq/L (ref 96–112)
Potassium: 4.1 mEq/L (ref 3.5–5.3)
Total Protein: 7.1 g/dL (ref 6.0–8.3)

## 2013-03-13 ENCOUNTER — Other Ambulatory Visit: Payer: Self-pay

## 2013-03-13 DIAGNOSIS — Z1231 Encounter for screening mammogram for malignant neoplasm of breast: Secondary | ICD-10-CM

## 2013-03-19 ENCOUNTER — Other Ambulatory Visit: Payer: Self-pay | Admitting: Family Medicine

## 2013-03-21 NOTE — Telephone Encounter (Signed)
The patient is calling because she has only one left of her Alprazolam and she has been waiting for this refill since Saturday.  She is wondering if Dr. Andria Frames isn't in the office, why someone else can't do the refills for him.

## 2013-03-22 ENCOUNTER — Other Ambulatory Visit: Payer: Self-pay | Admitting: Family Medicine

## 2013-03-22 MED ORDER — ALPRAZOLAM 0.5 MG PO TABS: ORAL_TABLET | ORAL | Status: DC

## 2013-03-22 NOTE — Telephone Encounter (Signed)
Called in.

## 2013-03-22 NOTE — Telephone Encounter (Signed)
Pt called because [facility] requested a refill request on her xanax Saturday 8/23 and again on 8/25. She called them today and they said that we have not responded. She would like the refill request to be approved asap since she is noqw out of medication. JW

## 2013-03-22 NOTE — Telephone Encounter (Signed)
Dear [facility] Please call this in THANKS! Sara Neal  

## 2013-03-23 NOTE — Telephone Encounter (Signed)
Jacqueline [name] called it in yesterday. I am sorry it has been a problem for her. It is called in. THANKS! Dorcas Mcmurray

## 2013-04-04 ENCOUNTER — Ambulatory Visit
Admission: RE | Admit: 2013-04-04 | Discharge: 2013-04-04 | Disposition: A | Discharge status: Home / Self Care | Payer: Medicare Other | Source: Ambulatory Visit

## 2013-04-04 DIAGNOSIS — Z1231 Encounter for screening mammogram for malignant neoplasm of breast: Secondary | ICD-10-CM

## 2013-04-17 ENCOUNTER — Other Ambulatory Visit: Payer: Self-pay | Admitting: Family Medicine

## 2013-04-21 ENCOUNTER — Other Ambulatory Visit: Payer: Self-pay | Admitting: Family Medicine

## 2013-05-04 ENCOUNTER — Ambulatory Visit (INDEPENDENT_AMBULATORY_CARE_PROVIDER_SITE_OTHER): Payer: Medicare Other | Admitting: *Deleted

## 2013-05-04 DIAGNOSIS — Z23 Encounter for immunization: Secondary | ICD-10-CM

## 2013-06-02 ENCOUNTER — Other Ambulatory Visit: Payer: Self-pay | Admitting: Family Medicine

## 2013-06-27 ENCOUNTER — Telehealth: Payer: Self-pay | Admitting: Family Medicine

## 2013-06-27 DIAGNOSIS — K279 Peptic ulcer, site unspecified, unspecified as acute or chronic, without hemorrhage or perforation: Secondary | ICD-10-CM

## 2013-06-27 DIAGNOSIS — G4700 Insomnia, unspecified: Secondary | ICD-10-CM

## 2013-06-27 DIAGNOSIS — I10 Essential (primary) hypertension: Secondary | ICD-10-CM

## 2013-06-27 DIAGNOSIS — E7800 Pure hypercholesterolemia, unspecified: Secondary | ICD-10-CM

## 2013-06-27 NOTE — Telephone Encounter (Signed)
Patient dropped off papers to be faxed in for her prescriptions.  She also needs a prescription written of alpralazam for her to pick up.

## 2013-06-27 NOTE — Telephone Encounter (Signed)
Forms were given to Dr Andria Frames to fill out. Mateen Franssen, Lewie Loron

## 2013-06-28 MED ORDER — ALPRAZOLAM 0.5 MG PO TABS: ORAL_TABLET | ORAL | Status: DC

## 2013-06-28 MED ORDER — OMEPRAZOLE 20 MG PO CPDR: DELAYED_RELEASE_CAPSULE | ORAL | Status: DC

## 2013-06-28 MED ORDER — METOPROLOL TARTRATE 25 MG PO TABS: 25.0000 mg | ORAL_TABLET | Freq: Two times a day (BID) | ORAL | Status: DC

## 2013-06-28 MED ORDER — HYDROCHLOROTHIAZIDE 25 MG PO TABS: 25.0000 mg | ORAL_TABLET | Freq: Every day | ORAL | Status: DC

## 2013-06-28 MED ORDER — TRAZODONE HCL 50 MG PO TABS: ORAL_TABLET | ORAL | Status: DC

## 2013-06-28 MED ORDER — ENALAPRIL MALEATE 10 MG PO TABS: ORAL_TABLET | ORAL | Status: DC

## 2013-06-28 MED ORDER — ROPINIROLE HCL 0.25 MG PO TABS: ORAL_TABLET | ORAL | Status: DC

## 2013-06-28 MED ORDER — SIMVASTATIN 40 MG PO TABS: ORAL_TABLET | ORAL | Status: DC

## 2013-06-28 NOTE — Telephone Encounter (Signed)
Paperwork looks like she needs written prescriptions (provided) which pharmacy should send in.

## 2013-06-28 NOTE — Assessment & Plan Note (Signed)
For mail in [place]

## 2013-06-28 NOTE — Assessment & Plan Note (Signed)
For mail in [place]

## 2013-06-28 NOTE — Assessment & Plan Note (Signed)
For mail in [place]

## 2013-06-28 NOTE — Assessment & Plan Note (Signed)
Will mail in [place]

## 2013-06-29 NOTE — Telephone Encounter (Signed)
LVM for patien to call back. I have hand written rx's and just had questions to ask her

## 2013-06-30 NOTE — Telephone Encounter (Signed)
Pt is going to the dr-leaving at 11.  Maybe someone could call her before then

## 2013-06-30 NOTE — Telephone Encounter (Signed)
Pt returned Sara's call

## 2013-07-03 NOTE — Telephone Encounter (Signed)
Pt is returning Sara's call about the prescriptions. Please call back. jw

## 2013-07-04 NOTE — Telephone Encounter (Signed)
Spoke with patient and she would like the rx faxed to her son's work. I will fax and also set hard copies up front if needed

## 2013-07-24 ENCOUNTER — Other Ambulatory Visit: Payer: Self-pay | Admitting: Family Medicine

## 2013-07-24 DIAGNOSIS — R350 Frequency of micturition: Secondary | ICD-10-CM

## 2013-07-24 NOTE — Assessment & Plan Note (Signed)
Called with sx of possible UTI.  Will check UA and treat per results.

## 2013-08-16 ENCOUNTER — Ambulatory Visit (HOSPITAL_COMMUNITY)
Admission: RE | Admit: 2013-08-16 | Discharge: 2013-08-16 | Disposition: A | Discharge status: Home / Self Care | Payer: Medicare Other | Source: Ambulatory Visit | Attending: Family Medicine | Admitting: Family Medicine

## 2013-08-16 ENCOUNTER — Ambulatory Visit (INDEPENDENT_AMBULATORY_CARE_PROVIDER_SITE_OTHER): Payer: Medicare Other | Admitting: Family Medicine

## 2013-08-16 ENCOUNTER — Encounter: Payer: Self-pay | Admitting: Family Medicine

## 2013-08-16 VITALS — BP 130/68 | HR 88 | Temp 98.2°F | Ht 61.0 in | Wt 153.5 lb

## 2013-08-16 DIAGNOSIS — R059 Cough, unspecified: Secondary | ICD-10-CM

## 2013-08-16 DIAGNOSIS — R05 Cough: Secondary | ICD-10-CM

## 2013-08-16 MED ORDER — GUAIFENESIN 200 MG PO TABS: 200.0000 mg | ORAL_TABLET | ORAL | Status: DC | PRN

## 2013-08-16 MED ORDER — BENZONATATE 200 MG PO CAPS: 200.0000 mg | ORAL_CAPSULE | Freq: Two times a day (BID) | ORAL | Status: DC | PRN

## 2013-08-16 NOTE — Progress Notes (Signed)
Family Medicine Office Visit Note   Subjective:   Patient ID: Jacqueline [name], female  DOB: Sep 21, 1930, 80 y.o.. MRN: [id number]   Pt that comes today for same day appointment complaining of cough for about 4 weeks. He started with general malaise and diarrhea and evolved to upper respiratory symptoms and cough. Cough initially was dry but now it is more productive with yellowish sputum. Patient denies fever, chills, nausea, vomiting or difficulty breathing. She reports only taken OTC medications but they are not longer helping.   Review of Systems:  Per HPI Pt denies chest pain, palpitations, headaches, dizziness, numbness or weakness. No changes on urinary or BM habits. No unintentional weigh loss/gain.   Objective:   Physical Exam: Gen:  NAD HEENT: Moist mucous membranes  CV: Regular rate and rhythm, no murmurs rubs or gallops PULM: Clear to auscultation bilaterally, scattered rhonchi no wheezing or rales.  ABD: Soft, non tender, non distended, normal bowel sounds EXT: No edema Neuro: Alert and oriented x3. No focalization  Assessment & Plan:

## 2013-08-16 NOTE — Patient Instructions (Signed)
It has been a pleasure to see you today. Please take the medications as prescribed.  I will call you with xrays results and will discuss plan at that time. Make your next appointment in 2-4 days for follow up if you don't improve or sooner if your symptoms worsen.

## 2013-08-17 ENCOUNTER — Telehealth: Payer: Self-pay | Admitting: Family Medicine

## 2013-08-17 DIAGNOSIS — R059 Cough, unspecified: Secondary | ICD-10-CM | POA: Insufficient documentation

## 2013-08-17 DIAGNOSIS — R05 Cough: Secondary | ICD-10-CM | POA: Insufficient documentation

## 2013-08-17 MED ORDER — PREDNISONE 20 MG PO TABS: 40.0000 mg | ORAL_TABLET | Freq: Every day | ORAL | Status: DC

## 2013-08-17 NOTE — Telephone Encounter (Signed)
Called pt and informed results of xrays. Prednisone prescribed and close f/u. Pt appreciative of call and agreeable with plan.

## 2013-08-17 NOTE — Assessment & Plan Note (Addendum)
Most likely reactive airway disease secondary to viral infection, concerning that pt has hx of diastolic heart failure and length of her symptoms. Normal O2 sat and vitals, no hx of smoker or COPD. No signs of fluid overload. Weight stable.  Will obtain xray Symptomatic treatment for now with Guaifenesin and Tessalon Perles.  Will discuss rest of treatment options once CXR is done.

## 2013-08-17 NOTE — Telephone Encounter (Signed)
Would like results from yesterdays chest xrays

## 2013-08-21 ENCOUNTER — Ambulatory Visit: Payer: Medicare Other | Admitting: Family Medicine

## 2013-08-24 ENCOUNTER — Encounter: Payer: Self-pay | Admitting: Family Medicine

## 2013-08-24 ENCOUNTER — Ambulatory Visit (INDEPENDENT_AMBULATORY_CARE_PROVIDER_SITE_OTHER): Payer: Medicare Other | Admitting: Family Medicine

## 2013-08-24 VITALS — BP 140/87 | HR 67 | Temp 98.6°F | Ht 61.0 in | Wt 150.0 lb

## 2013-08-24 DIAGNOSIS — R05 Cough: Secondary | ICD-10-CM

## 2013-08-24 DIAGNOSIS — J069 Acute upper respiratory infection, unspecified: Secondary | ICD-10-CM | POA: Insufficient documentation

## 2013-08-24 DIAGNOSIS — R059 Cough, unspecified: Secondary | ICD-10-CM

## 2013-08-24 DIAGNOSIS — K625 Hemorrhage of anus and rectum: Secondary | ICD-10-CM

## 2013-08-24 MED ORDER — AZITHROMYCIN 250 MG PO TABS: ORAL_TABLET | ORAL | Status: DC

## 2013-08-24 MED ORDER — BENZONATATE 200 MG PO CAPS: 200.0000 mg | ORAL_CAPSULE | Freq: Two times a day (BID) | ORAL | Status: DC | PRN

## 2013-08-24 MED ORDER — ALBUTEROL SULFATE HFA 108 (90 BASE) MCG/ACT IN AERS: 2.0000 | INHALATION_SPRAY | Freq: Four times a day (QID) | RESPIRATORY_TRACT | Status: DC | PRN

## 2013-08-24 NOTE — Patient Instructions (Addendum)
Upper Respiratory Infection, Adult An upper respiratory infection (URI) is also sometimes known as the common cold. The upper respiratory tract includes the nose, sinuses, throat, trachea, and bronchi. Bronchi are the airways leading to the lungs. Most people improve within 1 week, but symptoms can last up to 2 weeks. A residual cough may last even longer.  CAUSES Many different viruses can infect the tissues lining the upper respiratory tract. The tissues become irritated and inflamed and often become very moist. Mucus production is also common. A cold is contagious. You can easily spread the virus to others by oral contact. This includes kissing, sharing a glass, coughing, or sneezing. Touching your mouth or nose and then touching a surface, which is then touched by another person, can also spread the virus. SYMPTOMS  Symptoms typically develop 1 to 3 days after you come in contact with a cold virus. Symptoms vary from person to person. They may include:  Runny nose.  Sneezing.  Nasal congestion.  Sinus irritation.  Sore throat.  Loss of voice (laryngitis).  Cough.  Fatigue.  Muscle aches.  Loss of appetite.  Headache.  Low-grade fever. DIAGNOSIS  You might diagnose your own cold based on familiar symptoms, since most people get a cold 2 to 3 times a year. Your caregiver can confirm this based on your exam. Most importantly, your caregiver can check that your symptoms are not due to another disease such as strep throat, sinusitis, pneumonia, asthma, or epiglottitis. Blood tests, throat tests, and X-rays are not necessary to diagnose a common cold, but they may sometimes be helpful in excluding other more serious diseases. Your caregiver will decide if any further tests are required. RISKS AND COMPLICATIONS  You may be at risk for a more severe case of the common cold if you smoke cigarettes, have chronic heart disease (such as heart failure) or lung disease (such as asthma), or if  you have a weakened immune system. The very young and very old are also at risk for more serious infections. Bacterial sinusitis, middle ear infections, and bacterial pneumonia can complicate the common cold. The common cold can worsen asthma and chronic obstructive pulmonary disease (COPD). Sometimes, these complications can require emergency medical care and may be life-threatening. PREVENTION  The best way to protect against getting a cold is to practice good hygiene. Avoid oral or hand contact with people with cold symptoms. Wash your hands often if contact occurs. There is no clear evidence that vitamin C, vitamin E, echinacea, or exercise reduces the chance of developing a cold. However, it is always recommended to get plenty of rest and practice good nutrition. TREATMENT  Treatment is directed at relieving symptoms. There is no cure. Antibiotics are not effective, because the infection is caused by a virus, not by bacteria. Treatment may include:  Increased fluid intake. Sports drinks offer valuable electrolytes, sugars, and fluids.  Breathing heated mist or steam (vaporizer or shower).  Eating chicken soup or other clear broths, and maintaining good nutrition.  Getting plenty of rest.  Using gargles or lozenges for comfort.  Controlling fevers with ibuprofen or acetaminophen as directed by your caregiver.  Increasing usage of your inhaler if you have asthma. Zinc gel and zinc lozenges, taken in the first 24 hours of the common cold, can shorten the duration and lessen the severity of symptoms. Pain medicines may help with fever, muscle aches, and throat pain. A variety of non-prescription medicines are available to treat congestion and runny nose. Your caregiver   can make recommendations and may suggest nasal or lung inhalers for other symptoms.  HOME CARE INSTRUCTIONS   Only take over-the-counter or prescription medicines for pain, discomfort, or fever as directed by your  caregiver.  Use a warm mist humidifier or inhale steam from a shower to increase air moisture. This may keep secretions moist and make it easier to breathe.  Drink enough water and fluids to keep your urine clear or pale yellow.  Rest as needed.  Return to work when your temperature has returned to normal or as your caregiver advises. You may need to stay home longer to avoid infecting others. You can also use a face mask and careful hand washing to prevent spread of the virus. SEEK MEDICAL CARE IF:   After the first few days, you feel you are getting worse rather than better.  You need your caregiver's advice about medicines to control symptoms.  You develop chills, worsening shortness of breath, or brown or red sputum. These may be signs of pneumonia.  You develop yellow or brown nasal discharge or pain in the face, especially when you bend forward. These may be signs of sinusitis.  You develop a fever, swollen neck glands, pain with swallowing, or white areas in the back of your throat. These may be signs of strep throat. SEEK IMMEDIATE MEDICAL CARE IF:   You have a fever.  You develop severe or persistent headache, ear pain, sinus pain, or chest pain.  You develop wheezing, a prolonged cough, cough up blood, or have a change in your usual mucus (if you have chronic lung disease).  You develop sore muscles or a stiff neck. Document Released: 01/06/2001 Document Revised: 10/05/2011 Document Reviewed: 11/14/2010 [facility] Patient Information 2014 [place], [place].   Make sure to follow up with dr. Andria Frames in 2 weeks or sooner if you are not feeling better. If bleeding per rectum continues, please be seen immediately.

## 2013-08-24 NOTE — Progress Notes (Signed)
   Subjective:    Patient ID: Jacqueline [name], female    DOB: [birth year], 80 y.o.   MRN: [id number]  HPI  URI: Pt was seen at the clinic last week with URI type symptoms. A CXR was performed (results below), and was not concerning for infectious or cardiac  process. Pt was given a steroid burst, which she completed, and cough suppressants.  She reports she is not feeling any better. She endorsed eating well, and drinking good. She feels she is staying well hydrated and denies dysuria or decrease in urine output. She denies fever. She reports loose stool  started this morning with no foul odor. She does endorse a small amount of bright red blood in the toilet. She reports a  H/o diverticulitis and internal hemorrhoids. She is taking 81 mg ASA and aleve BID for osteoarthritis pain. Pt reports she has a tendency to have constipation and normal has to take Miralax. She deneis body aches, pains or headaches. Starting to feel a little weaker, otherwise unchanged from prior appointment. She has used all her Ladona Ridgel and is requesting more. She reports still having a strong mildly productive cough, nasal congestion and shortness of breath.    Review of Systems Negative, with the exception of above mentioned in HPI  Objective:   Physical Exam BP 140/87  Pulse 67  Temp(Src) 98.6 F (37 C) (Oral)  Ht 5\' 1"  (1.549 m)  Wt 150 lb (68.04 kg)  BMI 28.36 kg/m2  SpO2 100% Gen: NAD. Appears tired.  HEENT: AT. [place]. Bilateral TM visualized and normal in appearance. Bilateral eyes without injections or icterus. MMM. Bilateral nares with mild erythema, no swelling. Throat without erythema or exudates. No LAD.  CV: RRR  Chest: CTAB, mild esp wheeze right base, with fine crackle. Abd: Soft. NTND. BS present.   Ext: No erythema. No edema.  Skin: No  rashes, purpura or petechiae.  Neuro: Normal gait. PERLA. EOMi. Alert. Grossly intact.  GU: Pt declined  Dg Chest 2 View  08/16/2013   CLINICAL DATA:   One-month history of cough.  EXAM: CHEST  2 VIEW  COMPARISON:  Thoracic spine series dated October 03, 2008.  FINDINGS: The lungs are mildly hyperinflated. There is no focal infiltrate. The cardiac silhouette is normal in size. The patient has undergone previous CABG. There is tortuosity of the descending thoracic aorta. There is no pulmonary vascular congestion. There is no pleural effusion or pneumothorax. The observed portions of the bony thorax exhibit no acute abnormalities.  IMPRESSION: There is mild hyperinflation which may reflect COPD or reactive airway disease. There is no evidence of pneumonia nor CHF.   Electronically Signed   By: David  Martinique   On: 08/16/2013 13:16

## 2013-08-25 DIAGNOSIS — K625 Hemorrhage of anus and rectum: Secondary | ICD-10-CM | POA: Insufficient documentation

## 2013-08-25 NOTE — Assessment & Plan Note (Signed)
-   pt with extended symptoms past viral infection. With mild wheezing on exam and no improvement I have prescribed Z-pack, refilled her tessalon perles and prescribed inhaler for 1-2 puffs every 4 PRN during illness. Educated pt on Z-pack use and inhaler need/use. Pt was appreciative.  - Red flags given and pt is to follow up with her PCP in 2 weeks or sooner if needed.

## 2013-08-25 NOTE — Assessment & Plan Note (Signed)
With small amount of rectal bright red blood and h/o of diverticulosis and internal hemorrhoids I believe this small amount of bleeding is from irritation to her hemorrhoids from diarrhea, with contaminate use of ASA and aleve BID. Pt declined rectal exam and she believes it is from her hemorrhoids. This was discussed in length and pt was advised to use aleve with caution and educated it can cause her to bleed more. Red flags were dicussed in detail and pt advised to seek immediate attention if her bleeding continues, worsens, associated with pain or fever.  Pt voiced understanding.

## 2013-08-31 ENCOUNTER — Encounter: Payer: Self-pay | Admitting: *Deleted

## 2013-08-31 ENCOUNTER — Encounter: Payer: Self-pay | Admitting: Cardiovascular Disease

## 2013-08-31 ENCOUNTER — Ambulatory Visit (INDEPENDENT_AMBULATORY_CARE_PROVIDER_SITE_OTHER): Payer: Medicare Other | Admitting: Cardiovascular Disease

## 2013-08-31 VITALS — BP 130/72 | HR 65 | Resp 16 | Ht 61.0 in | Wt 152.3 lb

## 2013-08-31 DIAGNOSIS — I519 Heart disease, unspecified: Secondary | ICD-10-CM

## 2013-08-31 DIAGNOSIS — I2510 Atherosclerotic heart disease of native coronary artery without angina pectoris: Secondary | ICD-10-CM

## 2013-08-31 DIAGNOSIS — I10 Essential (primary) hypertension: Secondary | ICD-10-CM

## 2013-08-31 DIAGNOSIS — E7800 Pure hypercholesterolemia, unspecified: Secondary | ICD-10-CM

## 2013-08-31 NOTE — Patient Instructions (Signed)
Your physician recommends that you schedule a follow-up appointment in: 1 year  

## 2013-09-01 ENCOUNTER — Encounter: Payer: Self-pay | Admitting: Cardiovascular Disease

## 2013-09-01 NOTE — Assessment & Plan Note (Signed)
Fair control. Ideally her LDL cholesterol be less than 70 and her triglycerides less than 150. Actually her LDL was well within the target range at a previous assessment in 2012 on the same medication. She admits that her diet may not be as strictly controlled now. She will try to do better. No Ccanges are made to her medications.

## 2013-09-01 NOTE — Progress Notes (Signed)
Patient ID: Jacqueline [name], female   DOB: 1931-02-02, 81 y.o.   MRN: [id number]     Reason for office visit CAD  Jacqueline [name] is now 80 years old and over 10 years have passed since her bypass surgery (LIMA to LAD and SVG to second diagonal, Dr. Servando Snare). Her LIMA is actually known to be poorly developed but she has excellent filling of the LAD system both via the native circulation and the venous bypass graft. She has preserved left ventricle systolic function. She had a normal nuclear perfusion study 2 years ago.   She has no complaints of a cardiovascular nature but is a little short of breath as she is recovering from sinusitis and bronchitis.   Allergies  Allergen Reactions  . Atorvastatin     REACTION: sorelegs  . Morphine And Related     Current Outpatient Prescriptions  Medication Sig Dispense Refill  . ALPRAZolam (XANAX) 0.5 MG tablet TAKE 1 TABLET TWICE A DAY  60 tablet  5  . aspirin 81 MG chewable tablet Chew 81 mg by mouth daily.        . benzonatate (TESSALON) 200 MG capsule Take 1 capsule (200 mg total) by mouth 2 (two) times daily as needed for cough.  20 capsule  0  . calcium-vitamin D (SM CALCIUM-VITAMIN D) 500-200 MG-UNIT per tablet Take 1 tablet by mouth 3 (three) times daily.        . cycloSPORINE (RESTASIS) 0.05 % ophthalmic emulsion Place 1 drop into both eyes 2 (two) times daily.      . enalapril (VASOTEC) 10 MG tablet TAKE 1 TABLET TWICE A DAY  180 tablet  3  . hydrochlorothiazide (HYDRODIURIL) 25 MG tablet Take 12.5 mg by mouth daily.      . metoprolol tartrate (LOPRESSOR) 25 MG tablet Take 1 tablet (25 mg total) by mouth 2 (two) times daily.  180 tablet  3  . Omega-3 Fatty Acids (FISH OIL) 1000 MG CAPS Take 1 capsule by mouth 4 (four) times daily. Per Cardiologist       . omeprazole (PRILOSEC) 20 MG capsule TAKE ONE CAPSULE EVERY DAY  90 capsule  3  . polyethylene glycol powder (GLYCOLAX/MIRALAX) powder TAKE 17 G BY MOUTH DAILY.  527 g  12  . rOPINIRole (REQUIP)  0.25 MG tablet TAKE 1 TABLET 3 TIMES A DAY  270 tablet  3  . simvastatin (ZOCOR) 40 MG tablet TAKE 1 TABLET AT BEDTIME  90 tablet  3  . traZODone (DESYREL) 50 MG tablet TABLET BY MOUTH AT BEDTIME  90 tablet  3   No current facility-administered medications for this visit.    Past Medical History  Diagnosis Date  . Hypertension   . CAD (coronary artery disease)   . History of stress test     Normal perfusion study which shows normal perfusion throughout and an EF 77%  . Hx of echocardiogram     Showed mild left ventricular hypertrophy, perserved left ventricular systolic function, evidence of diastolic dysfunction, and a severly dilated left atrium, but minimal valvular abnormalities.  . Hyperlipemia   . Insomnia   . Diverticulitis     Past Surgical History  Procedure Laterality Date  . Coronary artery bypass graft  2006    off-pump bypass surgery with LIMA to the LAD and SVG to the second diagonal artery ( performed by Dr Servando Snare)   . Joint replacement    . Abdominal hysterectomy      Family History  Problem  Relation Age of Onset  . Parkinson's disease Mother     History   Social History  . Marital Status: Widowed    Spouse Name: N/A    Number of Children: N/A  . Years of Education: N/A   Occupational History  . Not on file.   Social History Main Topics  . Smoking status: Never Smoker   . Smokeless tobacco: Never Used  . Alcohol Use: No  . Drug Use: No  . Sexual Activity: Not Currently   Other Topics Concern  . Not on file   Social History Narrative  . No narrative on file    Review of systems: All residual hoarseness, cough and shortness of breath from bronchitis The patient specifically denies any chest pain at rest or with exertion, orthopnea, paroxysmal nocturnal dyspnea, syncope, palpitations, focal neurological deficits, intermittent claudication, lower extremity edema, unexplained weight gain, hemoptysis or wheezing.  The patient also denies  abdominal pain, nausea, vomiting, dysphagia, diarrhea, constipation, polyuria, polydipsia, dysuria, hematuria, frequency, urgency, abnormal bleeding or bruising, fever, chills, unexpected weight changes, mood swings, change in skin or hair texture, change in voice quality, auditory or visual problems, allergic reactions or rashes, new musculoskeletal complaints other than usual "aches and pains".   PHYSICAL EXAM BP 130/72  Pulse 65  Resp 16  Ht 5\' 1"  (1.549 m)  Wt 69.083 kg (152 lb 4.8 oz)  BMI 28.79 kg/m2  General: Alert, oriented x3, no distress Head: no evidence of trauma, PERRL, EOMI, no exophtalmos or lid lag, no myxedema, no xanthelasma; normal ears, nose and oropharynx Neck: normal jugular venous pulsations and no hepatojugular reflux; brisk carotid pulses without delay and no carotid bruits Chest: clear to auscultation, no signs of consolidation by percussion or palpation, normal fremitus, symmetrical and full respiratory excursions, healed sternotomy scar Cardiovascular: normal position and quality of the apical impulse, regular rhythm, normal first and second heart sounds, no murmurs, rubs or gallops Abdomen: no tenderness or distention, no masses by palpation, no abnormal pulsatility or arterial bruits, normal bowel sounds, no hepatosplenomegaly Extremities: no clubbing, cyanosis or edema; 2+ radial, ulnar and brachial pulses bilaterally; 2+ right femoral, posterior tibial and dorsalis pedis pulses; 2+ left femoral, posterior tibial and dorsalis pedis pulses; no subclavian or femoral bruits Neurological: grossly nonfocal   EKG: Sinus rhythm, prominently inverted T waves in leads 1 and aVL, little changed from older tracings.  Lipid Panel     Component Value Date/Time   CHOL 161 03/08/2013 1410   TRIG 180* 03/08/2013 1410   HDL 46 03/08/2013 1410   CHOLHDL 3.5 03/08/2013 1410   VLDL 36 03/08/2013 1410   LDLCALC 79 03/08/2013 1410    BMET    Component Value Date/Time   NA 136  03/08/2013 1410   K 4.1 03/08/2013 1410   CL 101 03/08/2013 1410   CO2 27 03/08/2013 1410   GLUCOSE 84 03/08/2013 1410   BUN 28* 03/08/2013 1410   CREATININE 1.41* 03/08/2013 1410   CREATININE 1.19* 02/22/2011 1400   CALCIUM 9.7 03/08/2013 1410   GFRNONAA 44* 02/22/2011 1400   GFRAA 53* 02/22/2011 1400     ASSESSMENT AND PLAN HYPERCHOLESTEROLEMIA Fair control. Ideally her LDL cholesterol be less than 70 and her triglycerides less than 150. Actually her LDL was well within the target range at a previous assessment in 2012 on the same medication. She admits that her diet may not be as strictly controlled now. She will try to do better. No Ccanges are made to her  medications.  HYPERTENSION, BENIGN SYSTEMIC Good control  CORONARY, ARTERIOSCLEROSIS Asymptomatic and with a fairly recent low risk nuclear perfusion study.  Diastolic dysfunction, left ventricle Including no signs or symptoms of congestive heart failure   Orders Placed This Encounter  Procedures  . EKG 12-Lead   Meds ordered this encounter  Medications  . hydrochlorothiazide (HYDRODIURIL) 25 MG tablet    Sig: Take 12.5 mg by mouth daily.    Holli Humbles, MD, [place] [phone] office [phone] pager

## 2013-09-01 NOTE — Assessment & Plan Note (Signed)
Good control

## 2013-09-01 NOTE — Assessment & Plan Note (Signed)
Including no signs or symptoms of congestive heart failure

## 2013-09-01 NOTE — Assessment & Plan Note (Signed)
Asymptomatic and with a fairly recent low risk nuclear perfusion study.

## 2013-11-15 ENCOUNTER — Telehealth: Payer: Self-pay | Admitting: Family Medicine

## 2013-11-15 ENCOUNTER — Encounter: Payer: Self-pay | Admitting: Family Medicine

## 2013-11-15 NOTE — Telephone Encounter (Signed)
LVM for patent to call back. Please informed her that I have written letter for her an dthat I have placed it up front for pick up

## 2013-11-15 NOTE — Telephone Encounter (Signed)
Pt called and would like a letter stating that she was seen in 08/14 for her physical. She was seen in 03/08/13 but it shows OV, but she said that the visit was more like a yearly check up and needs the letter for her insurance to get the credit. Please call her when ready to pick up. jw

## 2013-11-15 NOTE — Telephone Encounter (Signed)
Yes, 8/13 visit was an annual physical

## 2013-12-22 ENCOUNTER — Encounter: Payer: Self-pay | Admitting: Family Medicine

## 2013-12-22 ENCOUNTER — Ambulatory Visit (INDEPENDENT_AMBULATORY_CARE_PROVIDER_SITE_OTHER): Payer: Medicare Other | Admitting: Family Medicine

## 2013-12-22 VITALS — BP 138/82 | HR 64 | Temp 98.4°F | Ht 61.0 in | Wt 151.3 lb

## 2013-12-22 DIAGNOSIS — I10 Essential (primary) hypertension: Secondary | ICD-10-CM

## 2013-12-22 DIAGNOSIS — R946 Abnormal results of thyroid function studies: Secondary | ICD-10-CM

## 2013-12-22 DIAGNOSIS — N184 Chronic kidney disease, stage 4 (severe): Secondary | ICD-10-CM | POA: Insufficient documentation

## 2013-12-22 DIAGNOSIS — Z23 Encounter for immunization: Secondary | ICD-10-CM

## 2013-12-22 DIAGNOSIS — N1832 Chronic kidney disease, stage 3b: Secondary | ICD-10-CM | POA: Insufficient documentation

## 2013-12-22 DIAGNOSIS — R7989 Other specified abnormal findings of blood chemistry: Secondary | ICD-10-CM | POA: Insufficient documentation

## 2013-12-22 DIAGNOSIS — N182 Chronic kidney disease, stage 2 (mild): Secondary | ICD-10-CM

## 2013-12-22 LAB — BASIC METABOLIC PANEL
BUN: 27 mg/dL — ABNORMAL HIGH (ref 6–23)
CALCIUM: 9.2 mg/dL (ref 8.4–10.5)
CO2: 27 mEq/L (ref 19–32)
Chloride: 102 mEq/L (ref 96–112)
Creat: 1.3 mg/dL — ABNORMAL HIGH (ref 0.50–1.10)
GLUCOSE: 88 mg/dL (ref 70–99)
POTASSIUM: 4.2 meq/L (ref 3.5–5.3)
Sodium: 138 mEq/L (ref 135–145)

## 2013-12-22 MED ORDER — POLYETHYLENE GLYCOL 3350 17 GM/SCOOP PO POWD: ORAL | Status: DC

## 2013-12-22 NOTE — Assessment & Plan Note (Signed)
Recheck BMP.

## 2013-12-22 NOTE — Patient Instructions (Signed)
See me for your annual in late Aug (after 8/13) I will call with blood test results Today you got the new pneumonia vaccine, prevnar.   You look marvelous.

## 2013-12-22 NOTE — Assessment & Plan Note (Signed)
Well controled. 

## 2013-12-22 NOTE — Assessment & Plan Note (Signed)
Recheck tsh

## 2013-12-22 NOTE — Progress Notes (Signed)
   Subjective:    Patient ID: Jacqueline [name], female    DOB: 1931/05/24, 80 y.o.   MRN: [id number]  HPI Recheck of HBP and other problems Feels great No complaints.   HPDP up to do except could use prevnar vaccine. Need to follow up CKD Need to follow up borderline high TSH.    Review of Systems     Objective:   Physical Exam Not tachy.  BP and wt fine Neck no thyromegally or mass Lungs clear  Cardiac RRR without m or g       Assessment & Plan:

## 2013-12-23 LAB — TSH: TSH: 3.148 u[IU]/mL (ref 0.350–4.500)

## 2013-12-25 ENCOUNTER — Telehealth: Payer: Self-pay | Admitting: Family Medicine

## 2013-12-25 NOTE — Telephone Encounter (Signed)
Pt called and would like Dr. Andria Frames to call her on her cell phone when her results come in. jw

## 2013-12-25 NOTE — Telephone Encounter (Signed)
Called and given results.  All normal and no change needed.

## 2014-01-02 ENCOUNTER — Other Ambulatory Visit: Payer: Self-pay | Admitting: Family Medicine

## 2014-01-02 MED ORDER — ALPRAZOLAM 0.5 MG PO TABS: ORAL_TABLET | ORAL | Status: DC

## 2014-01-02 NOTE — Telephone Encounter (Signed)
Refilled via fax request. 

## 2014-01-11 ENCOUNTER — Telehealth: Payer: Self-pay | Admitting: Family Medicine

## 2014-01-11 NOTE — Telephone Encounter (Signed)
Refill request for Xanax. Please send to [facility].

## 2014-01-11 NOTE — Telephone Encounter (Signed)
By my records, this was done on 6/9

## 2014-02-15 ENCOUNTER — Encounter: Payer: Self-pay | Admitting: Internal Medicine

## 2014-02-24 DIAGNOSIS — I4891 Unspecified atrial fibrillation: Secondary | ICD-10-CM

## 2014-02-24 HISTORY — DX: Unspecified atrial fibrillation: I48.91

## 2014-02-26 ENCOUNTER — Encounter (HOSPITAL_COMMUNITY): Payer: Self-pay | Admitting: Emergency Medicine

## 2014-02-26 ENCOUNTER — Inpatient Hospital Stay (HOSPITAL_COMMUNITY)
Admission: EM | Admit: 2014-02-26 | Discharge: 2014-03-01 | DRG: 310 | Disposition: A | Discharge status: Home / Self Care | Payer: Medicare Other | Attending: Internal Medicine | Admitting: Internal Medicine

## 2014-02-26 ENCOUNTER — Emergency Department (HOSPITAL_COMMUNITY): Payer: Medicare Other

## 2014-02-26 DIAGNOSIS — Z79899 Other long term (current) drug therapy: Secondary | ICD-10-CM

## 2014-02-26 DIAGNOSIS — I10 Essential (primary) hypertension: Secondary | ICD-10-CM | POA: Diagnosis present

## 2014-02-26 DIAGNOSIS — D649 Anemia, unspecified: Secondary | ICD-10-CM

## 2014-02-26 DIAGNOSIS — I2510 Atherosclerotic heart disease of native coronary artery without angina pectoris: Secondary | ICD-10-CM | POA: Diagnosis present

## 2014-02-26 DIAGNOSIS — Z7982 Long term (current) use of aspirin: Secondary | ICD-10-CM

## 2014-02-26 DIAGNOSIS — E876 Hypokalemia: Secondary | ICD-10-CM | POA: Diagnosis present

## 2014-02-26 DIAGNOSIS — Z7901 Long term (current) use of anticoagulants: Secondary | ICD-10-CM

## 2014-02-26 DIAGNOSIS — R000 Tachycardia, unspecified: Secondary | ICD-10-CM | POA: Diagnosis present

## 2014-02-26 DIAGNOSIS — G4700 Insomnia, unspecified: Secondary | ICD-10-CM | POA: Diagnosis present

## 2014-02-26 DIAGNOSIS — Z951 Presence of aortocoronary bypass graft: Secondary | ICD-10-CM

## 2014-02-26 DIAGNOSIS — I519 Heart disease, unspecified: Secondary | ICD-10-CM

## 2014-02-26 DIAGNOSIS — E785 Hyperlipidemia, unspecified: Secondary | ICD-10-CM | POA: Diagnosis present

## 2014-02-26 DIAGNOSIS — R0789 Other chest pain: Secondary | ICD-10-CM | POA: Diagnosis present

## 2014-02-26 DIAGNOSIS — I4891 Unspecified atrial fibrillation: Secondary | ICD-10-CM | POA: Diagnosis not present

## 2014-02-26 DIAGNOSIS — N182 Chronic kidney disease, stage 2 (mild): Secondary | ICD-10-CM

## 2014-02-26 DIAGNOSIS — K219 Gastro-esophageal reflux disease without esophagitis: Secondary | ICD-10-CM | POA: Diagnosis present

## 2014-02-26 DIAGNOSIS — M1990 Unspecified osteoarthritis, unspecified site: Secondary | ICD-10-CM | POA: Diagnosis present

## 2014-02-26 DIAGNOSIS — R0602 Shortness of breath: Secondary | ICD-10-CM | POA: Diagnosis present

## 2014-02-26 DIAGNOSIS — IMO0002 Reserved for concepts with insufficient information to code with codable children: Secondary | ICD-10-CM

## 2014-02-26 HISTORY — DX: Gastro-esophageal reflux disease without esophagitis: K21.9

## 2014-02-26 HISTORY — DX: Unspecified atrial fibrillation: I48.91

## 2014-02-26 LAB — BASIC METABOLIC PANEL
Anion gap: 13 (ref 5–15)
BUN: 30 mg/dL — AB (ref 6–23)
CHLORIDE: 102 meq/L (ref 96–112)
CO2: 22 meq/L (ref 19–32)
CREATININE: 1.07 mg/dL (ref 0.50–1.10)
Calcium: 9.3 mg/dL (ref 8.4–10.5)
GFR calc Af Amer: 55 mL/min — ABNORMAL LOW (ref >90)
GFR calc non Af Amer: 47 mL/min — ABNORMAL LOW (ref >90)
Glucose, Bld: 117 mg/dL — ABNORMAL HIGH (ref 70–99)
POTASSIUM: 3.6 meq/L — AB (ref 3.7–5.3)
Sodium: 137 mEq/L (ref 137–147)

## 2014-02-26 LAB — CBC
HEMATOCRIT: 33.1 % — AB (ref 36.0–46.0)
Hemoglobin: 11.3 g/dL — ABNORMAL LOW (ref 12.0–15.0)
MCH: 30.1 pg (ref 26.0–34.0)
MCHC: 34.1 g/dL (ref 30.0–36.0)
MCV: 88.3 fL (ref 78.0–100.0)
Platelets: 214 10*3/uL (ref 150–400)
RBC: 3.75 MIL/uL — ABNORMAL LOW (ref 3.87–5.11)
RDW: 14.2 % (ref 11.5–15.5)
WBC: 8.6 10*3/uL (ref 4.0–10.5)

## 2014-02-26 LAB — PROTIME-INR
INR: 1.03 (ref 0.00–1.49)
Prothrombin Time: 13.5 seconds (ref 11.6–15.2)

## 2014-02-26 LAB — I-STAT TROPONIN, ED: Troponin i, poc: 0.01 ng/mL (ref 0.00–0.08)

## 2014-02-26 LAB — MAGNESIUM: Magnesium: 1.8 mg/dL (ref 1.5–2.5)

## 2014-02-26 MED ORDER — DILTIAZEM HCL 100 MG IV SOLR
5.0000 mg/h | INTRAVENOUS | Status: DC
  Administered 2014-02-26: 5 mg/h via INTRAVENOUS
  Administered 2014-02-27: 10 mg/h via INTRAVENOUS
  Administered 2014-02-27: 15 mg/h via INTRAVENOUS
  Administered 2014-02-28: 10 mg/h via INTRAVENOUS
  Filled 2014-02-26 (×4): qty 100

## 2014-02-26 MED ORDER — DILTIAZEM LOAD VIA INFUSION
10.0000 mg | Freq: Once | INTRAVENOUS | Status: AC
  Administered 2014-02-26: 10 mg via INTRAVENOUS
  Filled 2014-02-26: qty 10

## 2014-02-26 NOTE — ED Notes (Signed)
Per EMS, pt reports chest tightness with a-fib with RVR (HR: 160). Pt denies hx of a-fib and is not taking blood thinners. Pt reported that she has had three other episodes that felt like this over the past 2 weeks but they went away quickly. NAD at this time. Pt denies SOB. BP: 124/92. Pt reported taking 5 nitro at home and 324 of aspirin.

## 2014-02-26 NOTE — ED Provider Notes (Signed)
CSN: [id number]     Arrival date & time 02/26/14  2211 History   First MD Initiated Contact with Patient 02/26/14 2224     Chief Complaint  Patient presents with  . Tachycardia  . Atrial Fibrillation     (Consider location/radiation/quality/duration/timing/severity/associated sxs/prior Treatment) Patient is a 80 y.o. female presenting with chest pain. The history is provided by the patient.  Chest Pain Pain location:  Substernal area Pain quality: aching   Pain radiates to:  Does not radiate Pain radiates to the back: no   Pain severity:  Moderate Onset quality:  Sudden Duration:  2 hours Timing:  Constant Progression:  Unchanged Chronicity:  New Relieved by:  Nothing Worsened by:  Nothing tried Ineffective treatments:  None tried Associated symptoms: palpitations   Associated symptoms: no dizziness, no fever, no headache, no nausea, no shortness of breath and not vomiting   Risk factors: coronary artery disease, high cholesterol and hypertension   Risk factors: no diabetes mellitus and no smoking    80 yo F with a chief complaint of chest tightness. Patient states is that off and on for the past 2 weeks. Patient states associated with some palpitations. Patient denies any diaphoresis. Does associate with subsequent shortness of breath. States that this pain feels like her prior pain before her CABG.   Past Medical History  Diagnosis Date  . Hypertension   . CAD (coronary artery disease)   . History of stress test     Normal perfusion study which shows normal perfusion throughout and an EF 77%  . Hx of echocardiogram     Showed mild left ventricular hypertrophy, perserved left ventricular systolic function, evidence of diastolic dysfunction, and a severly dilated left atrium, but minimal valvular abnormalities.  . Hyperlipemia   . Insomnia   . Diverticulitis    Past Surgical History  Procedure Laterality Date  . Coronary artery bypass graft  2006    off-pump bypass  surgery with LIMA to the LAD and SVG to the second diagonal artery ( performed by Dr Servando Snare)   . Joint replacement    . Abdominal hysterectomy     Family History  Problem Relation Age of Onset  . Parkinson's disease Mother    History  Substance Use Topics  . Smoking status: Never Smoker   . Smokeless tobacco: Never Used  . Alcohol Use: No   OB History   Grav Para Term Preterm Abortions TAB SAB Ect Mult Living                 Review of Systems  Constitutional: Negative for fever and chills.  HENT: Negative for congestion and rhinorrhea.   Eyes: Negative for redness and visual disturbance.  Respiratory: Negative for shortness of breath and wheezing.   Cardiovascular: Positive for chest pain and palpitations.  Gastrointestinal: Negative for nausea and vomiting.  Genitourinary: Negative for dysuria and urgency.  Musculoskeletal: Negative for arthralgias and myalgias.  Skin: Negative for pallor and wound.  Neurological: Negative for dizziness and headaches.      Allergies  Atorvastatin; Crestor; and Morphine and related  Home Medications   Prior to Admission medications   Medication Sig Start Date End Date Taking? Authorizing Provider  ALPRAZolam Duanne Moron) 0.5 MG tablet Take 0.5 mg by mouth 2 (two) times daily.   Yes Historical Provider, MD  aspirin 81 MG chewable tablet Chew 81 mg by mouth daily.     Yes Historical Provider, MD  calcium-vitamin D (SM CALCIUM-VITAMIN D) 500-200 MG-UNIT  per tablet Take 2 tablets by mouth 2 (two) times daily.    Yes Historical Provider, MD  diphenhydrAMINE (BENADRYL) 25 MG tablet Take 75 mg by mouth 2 (two) times daily.    Yes Historical Provider, MD  enalapril (VASOTEC) 10 MG tablet Take 10 mg by mouth 2 (two) times daily.   Yes Historical Provider, MD  hydrochlorothiazide (HYDRODIURIL) 25 MG tablet Take 12.5 mg by mouth daily. 06/28/13  Yes Zigmund Gottron, MD  metoprolol tartrate (LOPRESSOR) 25 MG tablet Take 1 tablet (25 mg total) by  mouth 2 (two) times daily. 06/28/13  Yes Zigmund Gottron, MD  Multiple Vitamin (MULTIVITAMIN WITH MINERALS) TABS tablet Take 1 tablet by mouth daily.   Yes Historical Provider, MD  naproxen sodium (ANAPROX) 220 MG tablet Take 220 mg by mouth daily as needed (for pain).   Yes Historical Provider, MD  nitroGLYCERIN (NITROSTAT) 0.4 MG SL tablet Place 0.4 mg under the tongue every 5 (five) minutes as needed for chest pain.   Yes Historical Provider, MD  Omega-3 Fatty Acids (FISH OIL) 1000 MG CAPS Take 1 capsule by mouth 2 (two) times daily. Per Cardiologist   Yes Historical Provider, MD  omeprazole (PRILOSEC) 20 MG capsule Take 20 mg by mouth daily.   Yes Historical Provider, MD  polyethylene glycol (MIRALAX / GLYCOLAX) packet Take 17 g by mouth daily as needed for moderate constipation (couple times weekly, depending).   Yes Historical Provider, MD  predniSONE (STERAPRED UNI-PAK) 10 MG tablet Take 10-60 tablets by mouth daily.   Yes Historical Provider, MD  rOPINIRole (REQUIP) 0.25 MG tablet Take 0.25 mg by mouth 3 (three) times daily.   Yes Historical Provider, MD  simvastatin (ZOCOR) 40 MG tablet Take 40 mg by mouth daily at 6 PM.   Yes Historical Provider, MD  traZODone (DESYREL) 50 MG tablet Take 50 mg by mouth at bedtime.   Yes Historical Provider, MD   BP 141/88  Pulse 98  Temp(Src) 98.4 F (36.9 C)  Resp 23  Ht 5\' 1"  (1.549 m)  Wt 150 lb (68.04 kg)  BMI 28.36 kg/m2  SpO2 100% Physical Exam  Constitutional: She is oriented to person, place, and time. She appears well-developed and well-nourished. No distress.  HENT:  Head: Normocephalic and atraumatic.  Eyes: EOM are normal. Pupils are equal, round, and reactive to light.  Neck: Normal range of motion. Neck supple.  Cardiovascular: An irregularly irregular rhythm present. Exam reveals no gallop and no friction rub.   No murmur heard. Pulmonary/Chest: Effort normal. She has no wheezes. She has no rales.  Abdominal: Soft. She  exhibits no distension. There is no tenderness. There is no rebound.  Musculoskeletal: She exhibits no edema and no tenderness.  Neurological: She is alert and oriented to person, place, and time.  Skin: Skin is warm and dry. She is not diaphoretic.  Psychiatric: She has a normal mood and affect. Her behavior is normal.    ED Course  Procedures (including critical care time) Labs Review Labs Reviewed  CBC - Abnormal; Notable for the following:    RBC 3.75 (*)    Hemoglobin 11.3 (*)    HCT 33.1 (*)    All other components within normal limits  BASIC METABOLIC PANEL - Abnormal; Notable for the following:    Potassium 3.6 (*)    Glucose, Bld 117 (*)    BUN 30 (*)    GFR calc non Af Amer 47 (*)    GFR calc Af Wyvonnia Lora  55 (*)    All other components within normal limits  PROTIME-INR  MAGNESIUM  I-STAT TROPOININ, ED    Imaging Review Dg Chest Port 1 View  02/26/2014   CLINICAL DATA:  Chest pain.  Shortness of breath.  EXAM: PORTABLE CHEST - 1 VIEW  COMPARISON:  08/16/2013  FINDINGS: No cardiomegaly.  Unchanged aortic tortuosity.  CABG changes noted.  Low lung volumes with interstitial crowding. There is no edema, consolidation, effusion, or pneumothorax.  Right rotator cuff tearing with repair.  IMPRESSION: No active disease.   Electronically Signed   By: Jorje Guild M.D.   On: 02/26/2014 23:28     EKG Interpretation   Date/Time:  Monday February 26 2014 22:18:51 EDT Ventricular Rate:  146 PR Interval:    QRS Duration: 91 QT Interval:  316 QTC Calculation: 492 R Axis:   43 Text Interpretation:  Atrial fibrillation with rapid V-rate Ventricular  premature complex Repolarization abnormality, prob rate related afib is  new Confirmed by GOLDSTON  MD, SCOTT ([id number]) on 02/26/2014 10:24:59 PM      MDM   Final diagnoses:  Atrial fibrillation with RVR    80 yo F a fib RVR, started on dilt gtt, unremarkable labs.  Cards consulted.   Admit to cards.    Deno Etienne, MD 02/27/14  [id number]  Deno Etienne, MD 02/27/14 [phone]

## 2014-02-27 ENCOUNTER — Encounter (HOSPITAL_COMMUNITY): Payer: Self-pay | Admitting: *Deleted

## 2014-02-27 DIAGNOSIS — I10 Essential (primary) hypertension: Secondary | ICD-10-CM

## 2014-02-27 DIAGNOSIS — I480 Paroxysmal atrial fibrillation: Secondary | ICD-10-CM | POA: Insufficient documentation

## 2014-02-27 DIAGNOSIS — Z79899 Other long term (current) drug therapy: Secondary | ICD-10-CM | POA: Diagnosis not present

## 2014-02-27 DIAGNOSIS — K219 Gastro-esophageal reflux disease without esophagitis: Secondary | ICD-10-CM | POA: Diagnosis present

## 2014-02-27 DIAGNOSIS — Z7982 Long term (current) use of aspirin: Secondary | ICD-10-CM | POA: Diagnosis not present

## 2014-02-27 DIAGNOSIS — I4891 Unspecified atrial fibrillation: Secondary | ICD-10-CM | POA: Diagnosis present

## 2014-02-27 DIAGNOSIS — M1990 Unspecified osteoarthritis, unspecified site: Secondary | ICD-10-CM | POA: Diagnosis present

## 2014-02-27 DIAGNOSIS — R0789 Other chest pain: Secondary | ICD-10-CM | POA: Diagnosis present

## 2014-02-27 DIAGNOSIS — N182 Chronic kidney disease, stage 2 (mild): Secondary | ICD-10-CM

## 2014-02-27 DIAGNOSIS — Z951 Presence of aortocoronary bypass graft: Secondary | ICD-10-CM | POA: Diagnosis not present

## 2014-02-27 DIAGNOSIS — E876 Hypokalemia: Secondary | ICD-10-CM | POA: Diagnosis present

## 2014-02-27 DIAGNOSIS — I2510 Atherosclerotic heart disease of native coronary artery without angina pectoris: Secondary | ICD-10-CM

## 2014-02-27 DIAGNOSIS — R0602 Shortness of breath: Secondary | ICD-10-CM | POA: Diagnosis present

## 2014-02-27 DIAGNOSIS — E785 Hyperlipidemia, unspecified: Secondary | ICD-10-CM | POA: Diagnosis present

## 2014-02-27 DIAGNOSIS — I059 Rheumatic mitral valve disease, unspecified: Secondary | ICD-10-CM

## 2014-02-27 DIAGNOSIS — I519 Heart disease, unspecified: Secondary | ICD-10-CM

## 2014-02-27 DIAGNOSIS — IMO0002 Reserved for concepts with insufficient information to code with codable children: Secondary | ICD-10-CM | POA: Diagnosis not present

## 2014-02-27 DIAGNOSIS — R000 Tachycardia, unspecified: Secondary | ICD-10-CM | POA: Diagnosis present

## 2014-02-27 DIAGNOSIS — G4700 Insomnia, unspecified: Secondary | ICD-10-CM | POA: Diagnosis present

## 2014-02-27 LAB — BASIC METABOLIC PANEL
Anion gap: 15 (ref 5–15)
BUN: 26 mg/dL — ABNORMAL HIGH (ref 6–23)
CALCIUM: 9.6 mg/dL (ref 8.4–10.5)
CO2: 25 mEq/L (ref 19–32)
Chloride: 102 mEq/L (ref 96–112)
Creatinine, Ser: 1.15 mg/dL — ABNORMAL HIGH (ref 0.50–1.10)
GFR calc Af Amer: 50 mL/min — ABNORMAL LOW (ref >90)
GFR, EST NON AFRICAN AMERICAN: 43 mL/min — AB (ref >90)
GLUCOSE: 95 mg/dL (ref 70–99)
POTASSIUM: 4.2 meq/L (ref 3.7–5.3)
SODIUM: 142 meq/L (ref 137–147)

## 2014-02-27 LAB — TSH: TSH: 7.29 u[IU]/mL — ABNORMAL HIGH (ref 0.350–4.500)

## 2014-02-27 LAB — TROPONIN I: Troponin I: <0.3 ng/mL (ref <0.30)

## 2014-02-27 LAB — T4, FREE: FREE T4: 1.48 ng/dL (ref 0.80–1.80)

## 2014-02-27 LAB — HEPARIN LEVEL (UNFRACTIONATED): Heparin Unfractionated: 0.85 IU/mL — ABNORMAL HIGH (ref 0.30–0.70)

## 2014-02-27 MED ORDER — POLYETHYLENE GLYCOL 3350 17 G PO PACK
17.0000 g | PACK | Freq: Every day | ORAL | Status: DC | PRN
  Filled 2014-02-27: qty 1

## 2014-02-27 MED ORDER — ASPIRIN 81 MG PO CHEW
81.0000 mg | CHEWABLE_TABLET | Freq: Every day | ORAL | Status: DC
  Administered 2014-02-27 – 2014-03-01 (×3): 81 mg via ORAL
  Filled 2014-02-27 (×3): qty 1

## 2014-02-27 MED ORDER — DIPHENHYDRAMINE HCL 25 MG PO TABS
75.0000 mg | ORAL_TABLET | Freq: Two times a day (BID) | ORAL | Status: DC
  Administered 2014-02-27 – 2014-02-28 (×3): 75 mg via ORAL
  Filled 2014-02-27 (×8): qty 3

## 2014-02-27 MED ORDER — ENALAPRIL MALEATE 10 MG PO TABS
10.0000 mg | ORAL_TABLET | Freq: Two times a day (BID) | ORAL | Status: DC
  Administered 2014-02-27 – 2014-03-01 (×5): 10 mg via ORAL
  Filled 2014-02-27 (×7): qty 1

## 2014-02-27 MED ORDER — HYDROCHLOROTHIAZIDE 12.5 MG PO CAPS
12.5000 mg | ORAL_CAPSULE | Freq: Every day | ORAL | Status: DC
  Administered 2014-02-27 – 2014-03-01 (×2): 12.5 mg via ORAL
  Filled 2014-02-27 (×4): qty 1

## 2014-02-27 MED ORDER — HYDROCHLOROTHIAZIDE 25 MG PO TABS
12.5000 mg | ORAL_TABLET | Freq: Every day | ORAL | Status: DC
  Filled 2014-02-27 (×3): qty 0.5

## 2014-02-27 MED ORDER — TRAZODONE HCL 50 MG PO TABS
50.0000 mg | ORAL_TABLET | Freq: Every day | ORAL | Status: DC
  Administered 2014-02-27 – 2014-02-28 (×2): 50 mg via ORAL
  Filled 2014-02-27 (×3): qty 1

## 2014-02-27 MED ORDER — PREDNISONE 10 MG PO TABS
10.0000 mg | ORAL_TABLET | Freq: Every day | ORAL | Status: AC
  Filled 2014-02-27: qty 1

## 2014-02-27 MED ORDER — SIMVASTATIN 40 MG PO TABS
40.0000 mg | ORAL_TABLET | Freq: Every day | ORAL | Status: DC
  Administered 2014-02-27 – 2014-02-28 (×2): 40 mg via ORAL
  Filled 2014-02-27 (×3): qty 1

## 2014-02-27 MED ORDER — PANTOPRAZOLE SODIUM 40 MG PO TBEC
40.0000 mg | DELAYED_RELEASE_TABLET | Freq: Every day | ORAL | Status: DC
  Administered 2014-02-27 – 2014-03-01 (×3): 40 mg via ORAL
  Filled 2014-02-27 (×3): qty 1

## 2014-02-27 MED ORDER — ROPINIROLE HCL 0.25 MG PO TABS
0.2500 mg | ORAL_TABLET | Freq: Three times a day (TID) | ORAL | Status: DC
  Administered 2014-02-27 – 2014-03-01 (×7): 0.25 mg via ORAL
  Filled 2014-02-27 (×9): qty 1

## 2014-02-27 MED ORDER — METOPROLOL TARTRATE 25 MG PO TABS
25.0000 mg | ORAL_TABLET | Freq: Two times a day (BID) | ORAL | Status: DC
  Administered 2014-02-27 – 2014-03-01 (×5): 25 mg via ORAL
  Filled 2014-02-27 (×7): qty 1

## 2014-02-27 MED ORDER — ADULT MULTIVITAMIN W/MINERALS CH
1.0000 | ORAL_TABLET | Freq: Every day | ORAL | Status: DC
  Administered 2014-02-27 – 2014-03-01 (×2): 1 via ORAL
  Filled 2014-02-27 (×3): qty 1

## 2014-02-27 MED ORDER — HEPARIN BOLUS VIA INFUSION
3000.0000 [IU] | Freq: Once | INTRAVENOUS | Status: AC
  Administered 2014-02-27: 3000 [IU] via INTRAVENOUS
  Filled 2014-02-27: qty 3000

## 2014-02-27 MED ORDER — NITROGLYCERIN 0.4 MG SL SUBL: 0.4000 mg | SUBLINGUAL_TABLET | SUBLINGUAL | Status: DC | PRN

## 2014-02-27 MED ORDER — CALCIUM CARBONATE-VITAMIN D 500-200 MG-UNIT PO TABS
2.0000 | ORAL_TABLET | Freq: Two times a day (BID) | ORAL | Status: DC
  Administered 2014-02-27 – 2014-03-01 (×4): 2 via ORAL
  Filled 2014-02-27 (×6): qty 2

## 2014-02-27 MED ORDER — HEPARIN (PORCINE) IN NACL 100-0.45 UNIT/ML-% IJ SOLN
900.0000 [IU]/h | INTRAMUSCULAR | Status: DC
  Administered 2014-02-27: 1000 [IU]/h via INTRAVENOUS
  Administered 2014-02-28: 900 [IU]/h via INTRAVENOUS
  Filled 2014-02-27 (×2): qty 250

## 2014-02-27 MED ORDER — ALPRAZOLAM 0.5 MG PO TABS
0.5000 mg | ORAL_TABLET | Freq: Two times a day (BID) | ORAL | Status: DC
  Administered 2014-02-27 – 2014-03-01 (×5): 0.5 mg via ORAL
  Filled 2014-02-27 (×5): qty 1

## 2014-02-27 MED ORDER — PREDNISONE (PAK) 10 MG PO TABS: 100.0000 mg | ORAL_TABLET | Freq: Every day | ORAL | Status: DC

## 2014-02-27 NOTE — Progress Notes (Signed)
Patient Name: Jacqueline [name] Date of Encounter: 02/27/2014  Principal Problem:   Atrial fibrillation with rapid ventricular response  Active Problems:   Hypertension   GERD (gastroesophageal reflux disease)    Patient Profile: 80 yo female w/ hx CAD/CABG, HTN, GERD, was admitted 08/03 with afib RVR. TEE/DCCV discussed, pt agrees.  SUBJECTIVE: Feels weak, some chest pressure, has had since admission, better with improved HR control.  OBJECTIVE Filed Vitals:   02/27/14 0200 02/27/14 0224 02/27/14 0327 02/27/14 0401  BP: 136/76 143/98  121/66  Pulse: 80 81  85  Temp:  97.6 F (36.4 C)  98.1 F (36.7 C)  TempSrc:  Oral  Oral  Resp: 17 18  18   Height:  5\' 1"  (1.549 m)    Weight:  151 lb 1.6 oz (68.539 kg)    SpO2: 96% 97% 96% 95%    Intake/Output Summary (Last 24 hours) at 02/27/14 1029 Last data filed at 02/27/14 0555  Gross per 24 hour  Intake      0 ml  Output   1200 ml  Net  -1200 ml   Filed Weights   02/26/14 2220 02/27/14 0224  Weight: 150 lb (68.04 kg) 151 lb 1.6 oz (68.539 kg)    PHYSICAL EXAM General: Well developed, well nourished, female in no acute distress. Head: Normocephalic, atraumatic.  Neck: Supple without bruits, JVD not elevated. Lungs:  Resp regular and unlabored, few rales bases. Heart: Rapid irregular, S1, S2, no S3, S4, or murmur; no rub. Abdomen: Soft, non-tender, non-distended, BS + x 4.  Extremities: No clubbing, cyanosis, no edema.  Neuro: Alert and oriented X 3. Moves all extremities spontaneously. Psych: Normal affect.  LABS: CBC:  Recent Labs  02/26/14 2303  WBC 8.6  HGB 11.3*  HCT 33.1*  MCV 88.3  PLT 214   INR:  Recent Labs  02/26/14 2303  INR [id number]   Basic Metabolic Panel:  Recent Labs  02/26/14 2303 02/27/14 0655  NA 137 142  K 3.6* 4.2  CL 102 102  CO2 22 25  GLUCOSE 117* 95  BUN 30* 26*  CREATININE 1.07 1.15*  CALCIUM 9.3 9.6  MG 1.8  --     Recent Labs  02/26/14 2307  TROPIPOC 0.01    TELE:  Atrial fib, generally RVR      Radiology/Studies: Dg Chest Port 1 View 02/26/2014   CLINICAL DATA:  Chest pain.  Shortness of breath.  EXAM: PORTABLE CHEST - 1 VIEW  COMPARISON:  08/16/2013  FINDINGS: No cardiomegaly.  Unchanged aortic tortuosity.  CABG changes noted.  Low lung volumes with interstitial crowding. There is no edema, consolidation, effusion, or pneumothorax.  Right rotator cuff tearing with repair.  IMPRESSION: No active disease.   Electronically Signed   By: Jorje Guild M.D.   On: 02/26/2014 23:28   Current Medications:  . ALPRAZolam  0.5 mg Oral BID  . aspirin  81 mg Oral Daily  . calcium-vitamin D  2 tablet Oral BID  . diphenhydrAMINE  75 mg Oral BID  . enalapril  10 mg Oral BID  . hydrochlorothiazide  12.5 mg Oral Daily  . metoprolol tartrate  25 mg Oral BID  . multivitamin with minerals  1 tablet Oral Daily  . pantoprazole  40 mg Oral Daily  . predniSONE  10 mg Oral Q breakfast  . rOPINIRole  0.25 mg Oral TID  . simvastatin  40 mg Oral q1800  . traZODone  50 mg Oral QHS   .  diltiazem (CARDIZEM) infusion 5 mg/hr (02/27/14 0624)  . heparin 1,000 Units/hr (02/27/14 0258)    ASSESSMENT AND PLAN: Principal Problem:   Atrial fibrillation with rapid ventricular response - IV Cardizem improved rate but HR still very high with minimal activity, associated with chest pain. TEE/DCCV cannot be performed today, will schedule for am. Ck echo today, LA previously severely dilated.  Active Problems:   Hypertension - Improved with IV Cardizem    GERD (gastroesophageal reflux disease) - on PPI    Anticoagulation - on heparin, will need oral rx, CHADsVASc is 4.     Chest pain - hx CABG, symptoms improved with rate control. Continue to cycle enzymes. Will check echo. If symptoms continue after she is in SR, MD advise on stress test.   Signed, Rosaria Ferries , PA-C 10:29 AM 02/27/2014    Patient seen and examined. Agree with assessment and plan. Currently in AF  rate in the 70's on IV cardizem. On heparin for anticoagulation. No angina. TEE not able to be scheduled for today; will schedule for tomorrow. Will need to initiate NOAC for long term anticoagulation.   Troy Sine, MD, [facility] 02/27/2014 10:50 AM

## 2014-02-27 NOTE — Progress Notes (Signed)
ANTICOAGULATION CONSULT NOTE - Initial Consult  Pharmacy Consult for heparin Indication: atrial fibrillation  Allergies  Allergen Reactions  . Atorvastatin Other (See Comments)    Myalgias in legs  . Crestor [Rosuvastatin] Other (See Comments)    Myalgias in legs  . Morphine And Related Other (See Comments)    Crazy thoughts    Patient Measurements: Height: 5\' 1"  (154.9 cm) Weight: 150 lb (68.04 kg) IBW/kg (Calculated) : 47.8 Heparin Dosing Weight: 65kg  Vital Signs: Temp: 98.4 F (36.9 C) (08/03 2220) BP: 136/76 mmHg (08/04 0200) Pulse Rate: 80 (08/04 0200)  Labs:  Recent Labs  02/26/14 2303  HGB 11.3*  HCT 33.1*  PLT 214  LABPROT 13.5  INR 1.03  CREATININE 1.07    Estimated Creatinine Clearance: 36.4 ml/min (by C-G formula based on Cr of 1.07).   Medical History: Past Medical History  Diagnosis Date  . Hypertension   . CAD (coronary artery disease)   . History of stress test     Normal perfusion study which shows normal perfusion throughout and an EF 77%  . Hx of echocardiogram     Showed mild left ventricular hypertrophy, perserved left ventricular systolic function, evidence of diastolic dysfunction, and a severly dilated left atrium, but minimal valvular abnormalities.  . Hyperlipemia   . Insomnia   . Diverticulitis     Assessment: 80yo female c/o palpitations and chest tightness (~4 episodes over past few weeks), found to be in Afib w/ RVR w/ HR to 160s, to begin heparin.  Goal of Therapy:  Heparin level 0.3-0.7 units/ml Monitor platelets by anticoagulation protocol: Yes   Plan:  Will give heparin 3000 units x1 followed by gtt at 1000 units/hr and monitor heparin levels and CBC.  Wynona Neat, PharmD, BCPS  02/27/2014,2:08 AM

## 2014-02-27 NOTE — Progress Notes (Signed)
  Echocardiogram 2D Echocardiogram has been performed.  Donata Clay 02/27/2014, 3:45 PM

## 2014-02-27 NOTE — Progress Notes (Signed)
ANTICOAGULATION CONSULT NOTE - Initial Consult  Pharmacy Consult for heparin Indication: atrial fibrillation  Allergies  Allergen Reactions  . Atorvastatin Other (See Comments)    Myalgias in legs  . Crestor [Rosuvastatin] Other (See Comments)    Myalgias in legs  . Morphine And Related Other (See Comments)    Crazy thoughts    Patient Measurements: Height: 5\' 1"  (154.9 cm) Weight: 151 lb 1.6 oz (68.539 kg) IBW/kg (Calculated) : 47.8 Heparin Dosing Weight: 65kg  Vital Signs: Temp: 98.1 F (36.7 C) (08/04 0401) Temp src: Oral (08/04 0401) BP: 121/66 mmHg (08/04 0401) Pulse Rate: 85 (08/04 0401)  Labs:  Recent Labs  02/26/14 2303 02/27/14 0655 02/27/14 1040  HGB 11.3*  --   --   HCT 33.1*  --   --   PLT 214  --   --   LABPROT 13.5  --   --   INR 1.03  --   --   HEPARINUNFRC  --   --  0.85*  CREATININE 1.07 1.15*  --     Estimated Creatinine Clearance: 34 ml/min (by C-G formula based on Cr of 1.15).   Medical History: Past Medical History  Diagnosis Date  . Hypertension   . CAD (coronary artery disease)   . History of stress test     Normal perfusion study which shows normal perfusion throughout and an EF 77%  . Hx of echocardiogram     Showed mild left ventricular hypertrophy, perserved left ventricular systolic function, evidence of diastolic dysfunction, and a severly dilated left atrium, but minimal valvular abnormalities.  . Hyperlipemia   . Insomnia   . Diverticulitis   . GERD (gastroesophageal reflux disease)   . Atrial fibrillation with rapid ventricular response 02/2014    Assessment: 80yo female c/o palpitations and chest tightness (~4 episodes over past few weeks), found to be in Afib w/ RVR w/ HR to 160s.  Heparin gtt supratherapeutic this am, h/h mildly low butstable, plts wnl, no bleeding noted.  Goal of Therapy:  Heparin level 0.3-0.7 units/ml Monitor platelets by anticoagulation protocol: Yes   Plan:  -Decrease heparin to 900  units/hr -Daily HL, CBC -Recheck HL with am labs -Monitor for s/sx bleeding -F/u transition to PO anticoagulation   Hughes Better, PharmD, BCPS Clinical Pharmacist Pager: [phone] 02/27/2014 11:39 AM

## 2014-02-27 NOTE — ED Provider Notes (Signed)
I saw and evaluated the patient, reviewed the resident's note and I agree with the findings and plan.   EKG Interpretation   Date/Time:  Monday February 26 2014 22:18:51 EDT Ventricular Rate:  146 PR Interval:    QRS Duration: 91 QT Interval:  316 QTC Calculation: 492 R Axis:   43 Text Interpretation:  Atrial fibrillation with rapid V-rate Ventricular  premature complex Repolarization abnormality, prob rate related afib is  new Confirmed by Abeer Iversen  MD, Kanyah Matsushima ([id number]) on 02/26/2014 10:24:59 PM       Patient with new-onset atrial fibrillation with RVR. Has had what sounds like A. fib over the last several days, but no obvious pointing started. Not a candidate for electrocardioversion at this time. Symptoms improved with Cardizem. Will admit to cardiology.  Ephraim Hamburger, MD 02/27/14 2127

## 2014-02-27 NOTE — H&P (Signed)
Cardiology Consultation Note  Patient ID: Jacqueline [name], MRN: [id number], DOB/AGE: 08-10-1930 80 y.o. Admit date: 02/26/2014   Date of Consult: 02/27/2014 Primary Physician: Zigmund Gottron, MD Primary Cardiologist: Croitoru, Dani Gobble   Chief Complaint: palpitations   Assessment and Plan:  Afib with RVR  HTN  CAD s/p CABG  Hypokalemia    Plan  -Cont Dilt gtt  -CHADS2 score of 4 ( Age , HTN )  and HASBLED score of 3( Age, Aleve & Aspirin use and hx of divertilcular disease)  at most. Plan for anticoagulation with Heparin gtt with possible TEE cardioversion in am . Pt will discuss this option with her family more before consenting.  NPO after midnight  -Admit on tele  -Continue home BP meds,  -Stop aleve and continue Aspirin   -Likely diastolic dysfunction related. Will however check TSH and replete K    80 yr old female with hx of HTN , CAD s/p 2 vessel CABG , diastolic dysfunction here with CP & palpitations   HPI: pt states that earlier today she was lying down and reading book when she started having sudden onset of Chest pain and palpitations with some associated in SOB. In the ER she was noted to have Afib with RVR and was started on diltiazem gtt with HR currently 100 bpm.  Pt states that she has had atleast two such episodes of palpitations which resolved spontaneously after a few minutes. This episode was persistent which is why she came into the ER .  Pt denies any orthopnea, PND , LE edema , DOE ,  focal weakness, syncope, bleeding diathesis , claudication ,   Reports medication compliance. Takes aleve for osteoarthritis. Last had diverticulitis in 12/2012  Past Medical History  Diagnosis Date  . Hypertension   . CAD (coronary artery disease)   . History of stress test     Normal perfusion study which shows normal perfusion throughout and an EF 77%  . Hx of echocardiogram     Showed mild left ventricular hypertrophy, perserved left ventricular systolic function,  evidence of diastolic dysfunction, and a severly dilated left atrium, but minimal valvular abnormalities.  . Hyperlipemia   . Insomnia   . Diverticulitis       Most Recent Cardiac Studies: EKG 02/26/2014 Afib with RVR with secondary repol changes     Surgical History:  Past Surgical History  Procedure Laterality Date  . Coronary artery bypass graft  2006    off-pump bypass surgery with LIMA to the LAD and SVG to the second diagonal artery ( performed by Dr Servando Snare)   . Joint replacement    . Abdominal hysterectomy       Home Meds: Prior to Admission medications   Medication Sig Start Date End Date Taking? Authorizing Provider  ALPRAZolam Duanne Moron) 0.5 MG tablet Take 0.5 mg by mouth 2 (two) times daily.   Yes Historical Provider, MD  aspirin 81 MG chewable tablet Chew 81 mg by mouth daily.     Yes Historical Provider, MD  calcium-vitamin D (SM CALCIUM-VITAMIN D) 500-200 MG-UNIT per tablet Take 2 tablets by mouth 2 (two) times daily.    Yes Historical Provider, MD  diphenhydrAMINE (BENADRYL) 25 MG tablet Take 75 mg by mouth 2 (two) times daily.    Yes Historical Provider, MD  enalapril (VASOTEC) 10 MG tablet Take 10 mg by mouth 2 (two) times daily.   Yes Historical Provider, MD  hydrochlorothiazide (HYDRODIURIL) 25 MG tablet Take 12.5 mg by mouth daily.  06/28/13  Yes Zigmund Gottron, MD  metoprolol tartrate (LOPRESSOR) 25 MG tablet Take 1 tablet (25 mg total) by mouth 2 (two) times daily. 06/28/13  Yes Zigmund Gottron, MD  Multiple Vitamin (MULTIVITAMIN WITH MINERALS) TABS tablet Take 1 tablet by mouth daily.   Yes Historical Provider, MD  naproxen sodium (ANAPROX) 220 MG tablet Take 220 mg by mouth daily as needed (for pain).   Yes Historical Provider, MD  nitroGLYCERIN (NITROSTAT) 0.4 MG SL tablet Place 0.4 mg under the tongue every 5 (five) minutes as needed for chest pain.   Yes Historical Provider, MD  Omega-3 Fatty Acids (FISH OIL) 1000 MG CAPS Take 1 capsule by mouth 2  (two) times daily. Per Cardiologist   Yes Historical Provider, MD  omeprazole (PRILOSEC) 20 MG capsule Take 20 mg by mouth daily.   Yes Historical Provider, MD  polyethylene glycol (MIRALAX / GLYCOLAX) packet Take 17 g by mouth daily as needed for moderate constipation (couple times weekly, depending).   Yes Historical Provider, MD  predniSONE (STERAPRED UNI-PAK) 10 MG tablet Take 10-60 tablets by mouth daily.   Yes Historical Provider, MD  rOPINIRole (REQUIP) 0.25 MG tablet Take 0.25 mg by mouth 3 (three) times daily.   Yes Historical Provider, MD  simvastatin (ZOCOR) 40 MG tablet Take 40 mg by mouth daily at 6 PM.   Yes Historical Provider, MD  traZODone (DESYREL) 50 MG tablet Take 50 mg by mouth at bedtime.   Yes Historical Provider, MD    Inpatient Medications:    . diltiazem (CARDIZEM) infusion 10 mg/hr (02/26/14 2350)    Allergies:  Allergies  Allergen Reactions  . Atorvastatin Other (See Comments)    Myalgias in legs  . Crestor [Rosuvastatin] Other (See Comments)    Myalgias in legs  . Morphine And Related Other (See Comments)    Crazy thoughts    History   Social History  . Marital Status: Widowed    Spouse Name: N/A    Number of Children: N/A  . Years of Education: N/A   Occupational History  . Not on file.   Social History Main Topics  . Smoking status: Never Smoker   . Smokeless tobacco: Never Used  . Alcohol Use: No  . Drug Use: No  . Sexual Activity: Not Currently   Other Topics Concern  . Not on file   Social History Narrative  . No narrative on file     Family History  Problem Relation Age of Onset  . Parkinson's disease Mother      Review of Systems: General: negative for chills, fever, night sweats or weight changes.  Cardiovascular:per HPI  Dermatological: negative for rash Respiratory: negative for cough or wheezing Urologic: negative for hematuria Abdominal: negative for nausea, vomiting, diarrhea, bright red blood per rectum, melena,  or hematemesis Neurologic: negative for visual changes, syncope, or dizziness All other systems reviewed and are otherwise negative except as noted above.  Labs: No results found for this basename: CKTOTAL, CKMB, TROPONINI,  in the last 72 hours Lab Results  Component Value Date   WBC 8.6 02/26/2014   HGB 11.3* 02/26/2014   HCT 33.1* 02/26/2014   MCV 88.3 02/26/2014   PLT 214 02/26/2014    Recent Labs Lab 02/26/14 2303  NA 137  K 3.6*  CL 102  CO2 22  BUN 30*  CREATININE 1.07  CALCIUM 9.3  GLUCOSE 117*   Lab Results  Component Value Date   CHOL 161 03/08/2013  HDL 46 03/08/2013   LDLCALC 79 03/08/2013   TRIG 180* 03/08/2013   No results found for this basename: DDIMER    Radiology/Studies:  Dg Chest Port 1 View  02/26/2014   CLINICAL DATA:  Chest pain.  Shortness of breath.  EXAM: PORTABLE CHEST - 1 VIEW  COMPARISON:  08/16/2013  FINDINGS: No cardiomegaly.  Unchanged aortic tortuosity.  CABG changes noted.  Low lung volumes with interstitial crowding. There is no edema, consolidation, effusion, or pneumothorax.  Right rotator cuff tearing with repair.  IMPRESSION: No active disease.   Electronically Signed   By: Jorje Guild M.D.   On: 02/26/2014 23:28   Physical Exam: Blood pressure 135/77, pulse 126, temperature 98.4 F (36.9 C), resp. rate 22, height 5\' 1"  (1.549 m), weight 68.04 kg (150 lb), SpO2 97.00%. General: Well developed, well nourished, in no acute distress.  Neck: Negative for carotid bruits. JVD not elevated. Lungs: Clear bilaterally to auscultation without wheezes, rales, or rhonchi. Breathing is unlabored. Heart: irregular. No murmurs, rubs, or gallops appreciated. Abdomen: Soft, non-tender, non-distended with normoactive bowel sounds. No hepatomegaly. No rebound/guarding. No obvious abdominal masses. Extremities: No clubbing or cyanosis. No edema.  Distal pedal pulses are 2+ and equal bilaterally. Neuro: Alert and oriented X 3. No facial asymmetry. No focal  deficit. Moves all extremities spontaneously. Psych:  Responds to questions appropriately with a normal affect.    Cory Roughen, A M.D  02/27/2014, 12:09 AM

## 2014-02-28 ENCOUNTER — Encounter (HOSPITAL_COMMUNITY): Payer: Medicare Other | Admitting: Anesthesiology

## 2014-02-28 ENCOUNTER — Inpatient Hospital Stay (HOSPITAL_COMMUNITY): Payer: Medicare Other | Admitting: Anesthesiology

## 2014-02-28 ENCOUNTER — Encounter (HOSPITAL_COMMUNITY): Payer: Self-pay

## 2014-02-28 ENCOUNTER — Encounter (HOSPITAL_COMMUNITY)
Admission: EM | Disposition: A | Discharge status: Home / Self Care | Payer: Medicare Other | Source: Home / Self Care | Attending: Internal Medicine

## 2014-02-28 DIAGNOSIS — I4891 Unspecified atrial fibrillation: Principal | ICD-10-CM

## 2014-02-28 HISTORY — PX: TEE WITHOUT CARDIOVERSION: SHX5443

## 2014-02-28 HISTORY — PX: CARDIOVERSION: SHX1299

## 2014-02-28 LAB — CBC
HEMATOCRIT: 39.9 % (ref 36.0–46.0)
HEMOGLOBIN: 13.4 g/dL (ref 12.0–15.0)
MCH: 30 pg (ref 26.0–34.0)
MCHC: 33.6 g/dL (ref 30.0–36.0)
MCV: 89.5 fL (ref 78.0–100.0)
Platelets: 235 10*3/uL (ref 150–400)
RBC: 4.46 MIL/uL (ref 3.87–5.11)
RDW: 15 % (ref 11.5–15.5)
WBC: 11 10*3/uL — AB (ref 4.0–10.5)

## 2014-02-28 LAB — BASIC METABOLIC PANEL
Anion gap: 16 — ABNORMAL HIGH (ref 5–15)
BUN: 25 mg/dL — ABNORMAL HIGH (ref 6–23)
CALCIUM: 9.3 mg/dL (ref 8.4–10.5)
CHLORIDE: 102 meq/L (ref 96–112)
CO2: 23 meq/L (ref 19–32)
Creatinine, Ser: 1.08 mg/dL (ref 0.50–1.10)
GFR calc Af Amer: 54 mL/min — ABNORMAL LOW (ref >90)
GFR calc non Af Amer: 47 mL/min — ABNORMAL LOW (ref >90)
GLUCOSE: 105 mg/dL — AB (ref 70–99)
POTASSIUM: 4 meq/L (ref 3.7–5.3)
Sodium: 141 mEq/L (ref 137–147)

## 2014-02-28 LAB — HEPARIN LEVEL (UNFRACTIONATED): HEPARIN UNFRACTIONATED: 0.7 [IU]/mL (ref 0.30–0.70)

## 2014-02-28 LAB — TROPONIN I: Troponin I: <0.3 ng/mL (ref <0.30)

## 2014-02-28 SURGERY — ECHOCARDIOGRAM, TRANSESOPHAGEAL
Anesthesia: Monitor Anesthesia Care

## 2014-02-28 MED ORDER — FENTANYL CITRATE 0.05 MG/ML IJ SOLN: 25.0000 ug | INTRAMUSCULAR | Status: DC | PRN

## 2014-02-28 MED ORDER — APIXABAN 5 MG PO TABS: 5.0000 mg | ORAL_TABLET | Freq: Two times a day (BID) | ORAL | Status: DC

## 2014-02-28 MED ORDER — APIXABAN 5 MG PO TABS
5.0000 mg | ORAL_TABLET | Freq: Two times a day (BID) | ORAL | Status: DC
  Administered 2014-02-28 – 2014-03-01 (×2): 5 mg via ORAL
  Filled 2014-02-28 (×3): qty 1

## 2014-02-28 MED ORDER — SODIUM CHLORIDE 0.9 % IV SOLN
INTRAVENOUS | Status: DC
  Administered 2014-02-28 (×2): 500 mL via INTRAVENOUS

## 2014-02-28 MED ORDER — BUTAMBEN-TETRACAINE-BENZOCAINE 2-2-14 % EX AERO
INHALATION_SPRAY | CUTANEOUS | Status: DC | PRN
  Administered 2014-02-28: 2 via TOPICAL

## 2014-02-28 MED ORDER — SODIUM CHLORIDE 0.9 % IV SOLN
INTRAVENOUS | Status: DC | PRN
  Administered 2014-02-28: 12:00:00 via INTRAVENOUS

## 2014-02-28 MED ORDER — HEPARIN (PORCINE) IN NACL 100-0.45 UNIT/ML-% IJ SOLN
850.0000 [IU]/h | INTRAMUSCULAR | Status: AC
  Filled 2014-02-28: qty 250

## 2014-02-28 MED ORDER — FENTANYL CITRATE 0.05 MG/ML IJ SOLN
INTRAMUSCULAR | Status: DC | PRN
  Administered 2014-02-28 (×2): 50 ug via INTRAVENOUS

## 2014-02-28 MED ORDER — PROPOFOL 10 MG/ML IV BOLUS
INTRAVENOUS | Status: DC | PRN
  Administered 2014-02-28 (×3): 20 mg via INTRAVENOUS

## 2014-02-28 MED ORDER — PROPOFOL INFUSION 10 MG/ML OPTIME
INTRAVENOUS | Status: DC | PRN
  Administered 2014-02-28: 25 ug/kg/min via INTRAVENOUS

## 2014-02-28 NOTE — OR Nursing (Signed)
1217 Cardioverted 120 joules to NSR Dr. [name]

## 2014-02-28 NOTE — Op Note (Signed)
INDICATIONS: atrial fibrillation  PROCEDURE:   Informed consent was obtained prior to the procedure. The risks, benefits and alternatives for the procedure were discussed and the patient comprehended these risks.  Risks include, but are not limited to, cough, sore throat, vomiting, nausea, somnolence, esophageal and stomach trauma or perforation, bleeding, low blood pressure, aspiration, pneumonia, infection, trauma to the teeth and death.    After a procedural time-out, the oropharynx was anesthetized with 20% benzocaine spray. The patient received sedation (Dr. Linna Caprice, Anesthesiology).   The transesophageal probe was inserted in the esophagus and stomach without difficulty and multiple views were obtained.  The patient was kept under observation until the patient left the procedure room.  The patient left the procedure room in stable condition.   Agitated microbubble saline contrast was not administered.  COMPLICATIONS:    There were no immediate complications.  FINDINGS:  No LA thrombus. Markedly dilated left atrium  RECOMMENDATIONS:     Proceed with cardioversion  Time Spent Directly with the Patient:  30 minutes   Jacqueline [name] 02/28/2014, 11:59 AM

## 2014-02-28 NOTE — Interval H&P Note (Signed)
History and Physical Interval Note:  02/28/2014 9:19 AM  Jacqueline [name]  has presented today for surgery, with the diagnosis of A FIB  The various methods of treatment have been discussed with the patient and family. After consideration of risks, benefits and other options for treatment, the patient has consented to  Procedure(s): TRANSESOPHAGEAL ECHOCARDIOGRAM (TEE) (N/A) CARDIOVERSION (N/A) as a surgical intervention .  The patient's history has been reviewed, patient examined, no change in status, stable for surgery.  I have reviewed the patient's chart and labs.  Questions were answered to the patient's satisfaction.     Oslo Huntsman

## 2014-02-28 NOTE — Transfer of Care (Signed)
Immediate Anesthesia Transfer of Care Note  Patient: Jacqueline [name]  Procedure(s) Performed: Procedure(s): TRANSESOPHAGEAL ECHOCARDIOGRAM (TEE) (N/A) CARDIOVERSION (N/A)  Patient Location: PACU and Endoscopy Unit  Anesthesia Type:MAC  Level of Consciousness: awake, oriented and sedated  Airway & Oxygen Therapy: Patient Spontanous Breathing and Patient connected to nasal cannula oxygen  Post-op Assessment: Report given to PACU RN, Post -op Vital signs reviewed and stable and Patient moving all extremities  Post vital signs: Reviewed and stable  Complications: No apparent anesthesia complications

## 2014-02-28 NOTE — CV Procedure (Signed)
Procedure: Electrical Cardioversion Indications:  Atrial Fibrillation  Procedure Details:  Consent: Risks of procedure as well as the alternatives and risks of each were explained to the (patient/caregiver).  Consent for procedure obtained.  Time Out: Verified patient identification, verified procedure, site/side was marked, verified correct patient position, special equipment/implants available, medications/allergies/relevent history reviewed, required imaging and test results available.  Performed  Patient placed on cardiac monitor, pulse oximetry, supplemental oxygen as necessary.  Sedation given: by Anesthesiology, Dr. Linna Caprice Pacer pads placed anterior and posterior chest.  Cardioverted 1 time(s).  Cardioverted at 120J. Sync biphasic  Evaluation: Findings: Post procedure EKG shows: NSR, [id number] bpm Complications: None Patient did tolerate procedure well.  Time Spent Directly with the Patient:  60 minutes   Jacqueline [name] 02/28/2014, 12:20 PM

## 2014-02-28 NOTE — Anesthesia Preprocedure Evaluation (Addendum)
Anesthesia Evaluation  Patient identified by MRN, date of birth, ID band Patient awake    Reviewed: Allergy & Precautions, H&P , NPO status , Patient's Chart, lab work & pertinent test results  Airway Mallampati: II TM Distance: >3 FB Neck ROM: Full    Dental  (+) Edentulous Upper, Edentulous Lower   Pulmonary  breath sounds clear to auscultation  + decreased breath sounds      Cardiovascular hypertension, Rhythm:Irregular Rate:Tachycardia     Neuro/Psych    GI/Hepatic   Endo/Other    Renal/GU      Musculoskeletal   Abdominal   Peds  Hematology   Anesthesia Other Findings   Reproductive/Obstetrics                          Anesthesia Physical Anesthesia Plan  ASA: III  Anesthesia Plan: General   Post-op Pain Management:    Induction: Intravenous  Airway Management Planned: Natural Airway  Additional Equipment:   Intra-op Plan:   Post-operative Plan:   Informed Consent:   Dental advisory given  Plan Discussed with: CRNA and Anesthesiologist  Anesthesia Plan Comments:         Anesthesia Quick Evaluation

## 2014-02-28 NOTE — Transfer of Care (Signed)
Immediate Anesthesia Transfer of Care Note  Patient: Jacqueline [name]  Procedure(s) Performed: Procedure(s): TRANSESOPHAGEAL ECHOCARDIOGRAM (TEE) (N/A) CARDIOVERSION (N/A)  Patient Location: PACU and Endoscopy Unit  Anesthesia Type:MAC  Level of Consciousness: awake, alert , oriented and sedated  Airway & Oxygen Therapy: Patient Spontanous Breathing and Patient connected to nasal cannula oxygen  Post-op Assessment: Report given to PACU RN, Post -op Vital signs reviewed and stable and Patient moving all extremities  Post vital signs: Reviewed and stable  Complications: No apparent anesthesia complications

## 2014-02-28 NOTE — Care Management Note (Signed)
    Page 1 of 1   02/28/2014     2:28:58 PM CARE MANAGEMENT NOTE 02/28/2014  Patient:  Jacqueline [name], Jacqueline [name]   Account Number:  [id number]  Date Initiated:  02/28/2014  Documentation initiated by:  GRAVES-BIGELOW,Ulrich Soules  Subjective/Objective Assessment:   Pt admitted for Afib with RVR. S/p cardioversion. Pt is from home alone.     Action/Plan:   CM did provide pt with eliquis 30 day free card. Pt will need Rx for 30 day Supply. [facility] has medication available. Pt would like refills written as 90 day supply to be purchased via [employer]   Anticipated DC Date:  03/01/2014   Anticipated DC Plan:  HOME/SELF CARE         Choice offered to / List presented to:             Status of service:  Completed, signed off Medicare Important Message given?  YES (If response is "NO", the following Medicare IM given date fields will be blank) Date Medicare IM given:  02/28/2014 Medicare IM given by:  GRAVES-BIGELOW,Calloway Andrus Date Additional Medicare IM given:   Additional Medicare IM given by:    Discharge Disposition:  HOME/SELF CARE  Per UR Regulation:  Reviewed for med. necessity/level of care/duration of stay  If discussed at Long Length of Stay Meetings, dates discussed:    Comments:  **  ELIQUIS  5MG   BID OR 2.5 MG  COVER - YES CO-PAY- $ 70.00 FOR 30 DAY SUPPLY                 $ 140.00 FOR 60 DAY SUPPLY                 $ 210.00 FOR 90 DAY SUPPLY ***  MAIL ORDER  THRU [employer] # [phone] ***                $ 175.00 FOR 90 DAY SUPPLY PRIOR APPROVAL -NO PHARMACY : IN NETWORK:  [facility], [facility]

## 2014-02-28 NOTE — Progress Notes (Signed)
Patient Name: Jacqueline [name] Date of Encounter: 02/28/2014  Principal Problem:   Atrial fibrillation with rapid ventricular response Active Problems:   Hypertension   GERD (gastroesophageal reflux disease)   A-fib    Patient Profile: 80 yo female w/ hx CAD/CABG, HTN, GERD, was admitted 08/03 with afib RVR. TEE/DCCV discussed, pt agrees.    SUBJECTIVE: Still feels the atrial fibrillation. She is not otherwise short of breath and is having no chest pain.  OBJECTIVE Filed Vitals:   02/27/14 1353 02/27/14 1928 02/27/14 2210 02/28/14 0600  BP: 117/70 119/72 110/79 127/85  Pulse: 65 75 79 70  Temp: 98.7 F (37.1 C) 98.3 F (36.8 C)  97.4 F (36.3 C)  TempSrc: Oral Oral  Oral  Resp: 17 18  19   Height:      Weight:    152 lb 9.6 oz (69.219 kg)  SpO2: 95% 100%  98%    Intake/Output Summary (Last 24 hours) at 02/28/14 0800 Last data filed at 02/28/14 [id number]  Gross per 24 hour  Intake    520 ml  Output   1950 ml  Net  -1430 ml   Filed Weights   02/26/14 2220 02/27/14 0224 02/28/14 0600  Weight: 150 lb (68.04 kg) 151 lb 1.6 oz (68.539 kg) 152 lb 9.6 oz (69.219 kg)    PHYSICAL EXAM General: Well developed, well nourished, female in no acute distress. Head: Normocephalic, atraumatic.  Neck: Supple without bruits, JVD not elevated. Lungs:  Resp regular and unlabored, few rales bases Heart: Irregular rate and rhythm, rapid at times, S1, S2, no S3, S4, or murmur; no rub. Abdomen: Soft, non-tender, non-distended, BS + x 4.  Extremities: No clubbing, cyanosis, no edema.  Neuro: Alert and oriented X 3. Moves all extremities spontaneously. Psych: Normal affect.  LABS: CBC: Recent Labs  02/26/14 2303 02/28/14 0400  WBC 8.6 11.0*  HGB 11.3* 13.4  HCT 33.1* 39.9  MCV 88.3 89.5  PLT 214 235   INR: Recent Labs  02/26/14 2303  INR [id number]   Basic Metabolic Panel: Recent Labs  02/26/14 2303 02/27/14 0655 02/28/14 0400  NA 137 142 141  K 3.6* 4.2 4.0  CL 102 102  102  CO2 22 25 23   GLUCOSE 117* 95 105*  BUN 30* 26* 25*  CREATININE 1.07 1.15* 1.08  CALCIUM 9.3 9.6 9.3  MG 1.8  --   --    Cardiac Enzymes: Recent Labs  02/27/14 1650 02/27/14 2311 02/28/14 0400  TROPONINI <0.30 <0.30 <0.30    Recent Labs  02/26/14 2307  TROPIPOC 0.01   Thyroid Function Tests: Recent Labs  02/27/14 0655  TSH 7.290*   TELE:  Atrial fibrillation, RVR at time      ECHO: 02/27/2014 Conclusions - Left ventricle: The cavity size was normal. There was moderate concentric hypertrophy. Systolic function was normal. The estimated ejection fraction was in the range of 55% to 60%. Wall motion was normal; there were no regional wall motion abnormalities. - Aortic valve: Trileaflet; mildly thickened leaflets. There was no regurgitation. - Mitral valve: There was mild regurgitation. - Right ventricle: Systolic function was normal. - Right atrium: The atrium was normal in size. - Tricuspid valve: There was mild regurgitation. - Pulmonary arteries: Systolic pressure was within the normal range. Impressions: - Normal biventricular size and function. Mild tricuspid regurgitation.   Radiology/Studies: Dg Chest Port 1 View  02/26/2014   CLINICAL DATA:  Chest pain.  Shortness of breath.  EXAM: PORTABLE CHEST -  1 VIEW  COMPARISON:  08/16/2013  FINDINGS: No cardiomegaly.  Unchanged aortic tortuosity.  CABG changes noted.  Low lung volumes with interstitial crowding. There is no edema, consolidation, effusion, or pneumothorax.  Right rotator cuff tearing with repair.  IMPRESSION: No active disease.   Electronically Signed   By: Jorje Guild M.D.   On: 02/26/2014 23:28     Current Medications:  . ALPRAZolam  0.5 mg Oral BID  . aspirin  81 mg Oral Daily  . calcium-vitamin D  2 tablet Oral BID  . diphenhydrAMINE  75 mg Oral BID  . enalapril  10 mg Oral BID  . hydrochlorothiazide  12.5 mg Oral Daily  . metoprolol tartrate  25 mg Oral BID  . multivitamin with  minerals  1 tablet Oral Daily  . pantoprazole  40 mg Oral Daily  . rOPINIRole  0.25 mg Oral TID  . simvastatin  40 mg Oral q1800  . traZODone  50 mg Oral QHS   . diltiazem (CARDIZEM) infusion 10 mg/hr (02/28/14 0155)  . heparin 900 Units/hr (02/28/14 0155)    ASSESSMENT AND PLAN: Principal Problem:   Atrial fibrillation with rapid ventricular response - TEE/DC CV scheduled for today, orders written. Patient is agreeable to the procedure and looks forward to feeling better. After the procedure, review baseline heart rate and decide on continuation of Cardizem. She is tolerating the metoprolol well. Both atria are normal in size echocardiogram yesterday. EF is preserved.  Active Problems:   Hypertension - generally good control on current therapy    GERD (gastroesophageal reflux disease) - continue PPI    Anticoagulation - the patient is concerned about the cost of NOAC. Will have case management to benefits check and then decided she is a candidate for anything other than warfarin.  Plan: Begin DC planning, possible DC later today if patient does well with cardioversion.  Signed, Rosaria Ferries , PA-C 8:00 AM 02/28/2014  Patient seen and examined. Agree with assessment and plan. No chest pain or shortness of breath. AF in the 70's. For TEE cardioversion later this am.   Troy Sine, MD, [facility] 02/28/2014 10:08 AM

## 2014-02-28 NOTE — Progress Notes (Signed)
ANTICOAGULATION CONSULT NOTE - [facility] for heparin Indication: atrial fibrillation  Allergies  Allergen Reactions  . Atorvastatin Other (See Comments)    Myalgias in legs  . Crestor [Rosuvastatin] Other (See Comments)    Myalgias in legs  . Morphine And Related Other (See Comments)    Crazy thoughts    Patient Measurements: Height: 5\' 1"  (154.9 cm) Weight: 152 lb 9.6 oz (69.219 kg) IBW/kg (Calculated) : 47.8 Heparin Dosing Weight: 65kg  Vital Signs: Temp: 97.4 F (36.3 C) (08/05 0600) Temp src: Oral (08/05 0600) BP: 127/85 mmHg (08/05 0600) Pulse Rate: 70 (08/05 0600)  Labs:  Recent Labs  02/26/14 2303 02/27/14 0655 02/27/14 1040 02/27/14 1650 02/27/14 2311 02/28/14 0400  HGB 11.3*  --   --   --   --  13.4  HCT 33.1*  --   --   --   --  39.9  PLT 214  --   --   --   --  235  LABPROT 13.5  --   --   --   --   --   INR 1.03  --   --   --   --   --   HEPARINUNFRC  --   --  0.85*  --   --  0.70  CREATININE 1.07 1.15*  --   --   --  1.08  TROPONINI  --   --   --  <0.30 <0.30 <0.30    Estimated Creatinine Clearance: 36.4 ml/min (by C-G formula based on Cr of 1.08).   Medical History: Past Medical History  Diagnosis Date  . Hypertension   . CAD (coronary artery disease)   . History of stress test     Normal perfusion study which shows normal perfusion throughout and an EF 77%  . Hx of echocardiogram     Showed mild left ventricular hypertrophy, perserved left ventricular systolic function, evidence of diastolic dysfunction, and a severly dilated left atrium, but minimal valvular abnormalities.  . Hyperlipemia   . Insomnia   . Diverticulitis   . GERD (gastroesophageal reflux disease)   . Atrial fibrillation with rapid ventricular response 02/2014    Assessment: 80yo female c/o palpitations and chest tightness (~4 episodes over past few weeks), found to be in Afib w/ RVR w/ HR to 160s.  Heparin level within goal range this AM, but  remains at higher end of range.  Planning for TEE/DCCV today.  No bleeding or complications noted.  CBC/Pltc stable.  Goal of Therapy:  Heparin level 0.3-0.7 units/ml Monitor platelets by anticoagulation protocol: Yes   Plan:  -Decrease heparin gtt to 850 units/hr -Daily heparin level, CBC -Monitor for s/sx bleeding -F/u transition to PO anticoagulation  Uvaldo Rising, BCPS  Clinical Pharmacist Pager [phone]  02/28/2014 8:24 AM

## 2014-02-28 NOTE — Progress Notes (Signed)
  Echocardiogram Echocardiogram Transesophageal has been performed.  Darlina Sicilian M 02/28/2014, 1:08 PM

## 2014-02-28 NOTE — Progress Notes (Signed)
Pt's hr went up to 150s non-sustaining when walking to the restroom. Pt's hr immediately went back down to the 90s & 100s once pt sat back down in the bed. Pt will now be using the bsc the rest of the shift. Will continue to monitor the pt. Hoover Brunette, RN

## 2014-02-28 NOTE — Progress Notes (Signed)
Case mgt has note in computer on Eliquis. Information was reviewed by pt and family. They have decided that Eliquis is the anticoagulant of choice.   Pt initially considered for discharge after DCCV. However, she is very weak, and has not been out of bed since the DCCV. She has not eaten, either.   Will transition her to Eliquis per pharmacy today. Will make sure she ambulates tonight and tolerates PO intake well. Plan on d/c in am if maintaining SR and otherwise doing well.

## 2014-02-28 NOTE — Discharge Instructions (Signed)
Information on my medicine - ELIQUIS® (apixaban) °Why was Eliquis® prescribed for you? °Eliquis® was prescribed for you to reduce the risk of a blood clot forming that can cause a stroke if you have a medical condition called atrial fibrillation (a type of irregular heartbeat). ° °What do You need to know about Eliquis® ? °Take your Eliquis® TWICE DAILY - one tablet in the morning and one tablet in the evening with or without food. If you have difficulty swallowing the tablet whole please discuss with your pharmacist how to take the medication safely. ° °Take Eliquis® exactly as prescribed by your doctor and DO NOT stop taking Eliquis® without talking to the doctor who prescribed the medication.  Stopping may increase your risk of developing a stroke.  Refill your prescription before you run out. ° °After discharge, you should have regular check-up appointments with your healthcare provider that is prescribing your Eliquis®.  In the future your dose may need to be changed if your kidney function or weight changes by a significant amount or as you get older. ° °What do you do if you miss a dose? °If you miss a dose, take it as soon as you remember on the same day and resume taking twice daily.  Do not take more than one dose of ELIQUIS at the same time to make up a missed dose. ° °Important Safety Information °A possible side effect of Eliquis® is bleeding. You should call your healthcare provider right away if you experience any of the following: °  Bleeding from an injury or your nose that does not stop. °  Unusual colored urine (red or dark brown) or unusual colored stools (red or black). °  Unusual bruising for unknown reasons. °  A serious fall or if you hit your head (even if there is no bleeding). ° °Some medicines may interact with Eliquis® and might increase your risk of bleeding or clotting while on Eliquis®. To help avoid this, consult your healthcare provider or pharmacist prior to using any new  prescription or non-prescription medications, including herbals, vitamins, non-steroidal anti-inflammatory drugs (NSAIDs) and supplements. ° °This website has more information on Eliquis® (apixaban): www.Eliquis.com. ° °

## 2014-02-28 NOTE — Progress Notes (Addendum)
ANTICOAGULATION CONSULT NOTE - [place] [facility] for heparin Indication: atrial fibrillation  Allergies  Allergen Reactions  . Atorvastatin Other (See Comments)    Myalgias in legs  . Crestor [Rosuvastatin] Other (See Comments)    Myalgias in legs  . Morphine And Related Other (See Comments)    Crazy thoughts    Patient Measurements: Height: 5\' 1"  (154.9 cm) Weight: 152 lb 9.6 oz (69.219 kg) IBW/kg (Calculated) : 47.8 Heparin Dosing Weight: 65kg  Vital Signs: Temp: 97.5 F (36.4 C) (08/05 1353) Temp src: Oral (08/05 1353) BP: 120/66 mmHg (08/05 1353) Pulse Rate: 56 (08/05 1353)  Labs:  Recent Labs  02/26/14 2303 02/27/14 0655 02/27/14 1040 02/27/14 1650 02/27/14 2311 02/28/14 0400  HGB 11.3*  --   --   --   --  13.4  HCT 33.1*  --   --   --   --  39.9  PLT 214  --   --   --   --  235  LABPROT 13.5  --   --   --   --   --   INR 1.03  --   --   --   --   --   HEPARINUNFRC  --   --  0.85*  --   --  0.70  CREATININE 1.07 1.15*  --   --   --  1.08  TROPONINI  --   --   --  <0.30 <0.30 <0.30    Estimated Creatinine Clearance: 36.4 ml/min (by C-G formula based on Cr of 1.08).   Medical History: Past Medical History  Diagnosis Date  . Hypertension   . CAD (coronary artery disease)   . History of stress test     Normal perfusion study which shows normal perfusion throughout and an EF 77%  . Hx of echocardiogram     Showed mild left ventricular hypertrophy, perserved left ventricular systolic function, evidence of diastolic dysfunction, and a severly dilated left atrium, but minimal valvular abnormalities.  . Hyperlipemia   . Insomnia   . Diverticulitis   . GERD (gastroesophageal reflux disease)   . Atrial fibrillation with rapid ventricular response 02/2014    Assessment: 80yo female c/o palpitations and chest tightness (~4 episodes over past few weeks), found to be in Afib w/ RVR w/ HR to 160s. Pt is s/p DCCV today, planning to transition  pt from heparin gtt to Eliquis. CBC/Pltc stable. Pt with age > 76, Scr < 1.5, Wt > 60 kg therefore will begin Eliquis 5 mg po bid.  Goal of Therapy:  Heparin level 0.3-0.7 units/ml Monitor platelets by anticoagulation protocol: Yes   Plan:  -Turn off heparin at the same time first dose of Eliquis is administered -Monitor for s/sx bleeding -Watch renal function closely, may need lowerd dose -Recommend apixaban 5 mg po bid for the first 4 weeks following DCCV.  Since patient's renal function continues to fluctuate, (Scr 1.4 today, CrCl ~30 ml/min), and weight is near cutoff, would recommend decreasing dose to 2.5 mg po bid starting week [place], PharmD, BCPS Clinical Pharmacist Pager: [phone] 02/28/2014 5:28 PM

## 2014-03-01 ENCOUNTER — Encounter (HOSPITAL_COMMUNITY): Payer: Self-pay | Admitting: Cardiovascular Disease

## 2014-03-01 DIAGNOSIS — D649 Anemia, unspecified: Secondary | ICD-10-CM

## 2014-03-01 DIAGNOSIS — Z7901 Long term (current) use of anticoagulants: Secondary | ICD-10-CM

## 2014-03-01 LAB — BASIC METABOLIC PANEL
Anion gap: 12 (ref 5–15)
BUN: 31 mg/dL — ABNORMAL HIGH (ref 6–23)
CALCIUM: 9.8 mg/dL (ref 8.4–10.5)
CO2: 25 mEq/L (ref 19–32)
Chloride: 101 mEq/L (ref 96–112)
Creatinine, Ser: 1.34 mg/dL — ABNORMAL HIGH (ref 0.50–1.10)
GFR calc Af Amer: 42 mL/min — ABNORMAL LOW (ref >90)
GFR calc non Af Amer: 36 mL/min — ABNORMAL LOW (ref >90)
Glucose, Bld: 103 mg/dL — ABNORMAL HIGH (ref 70–99)
Potassium: 4.3 mEq/L (ref 3.7–5.3)
Sodium: 138 mEq/L (ref 137–147)

## 2014-03-01 LAB — CBC
HEMATOCRIT: 35.7 % — AB (ref 36.0–46.0)
Hemoglobin: 12 g/dL (ref 12.0–15.0)
MCH: 29.9 pg (ref 26.0–34.0)
MCHC: 33.6 g/dL (ref 30.0–36.0)
MCV: 89 fL (ref 78.0–100.0)
Platelets: 197 10*3/uL (ref 150–400)
RBC: 4.01 MIL/uL (ref 3.87–5.11)
RDW: 14.9 % (ref 11.5–15.5)
WBC: 11.1 10*3/uL — AB (ref 4.0–10.5)

## 2014-03-01 LAB — HEPARIN LEVEL (UNFRACTIONATED): Heparin Unfractionated: >2.2 IU/mL — ABNORMAL HIGH (ref 0.30–0.70)

## 2014-03-01 NOTE — Progress Notes (Signed)
         Subjective: No complaints  Objective: Vital signs in last 24 hours: Temp:  [97.5 F (36.4 C)-98.4 F (36.9 C)] 98.3 F (36.8 C) (08/06 0537) Pulse Rate:  [56-70] 70 (08/06 0537) Resp:  [15-18] 18 (08/06 0537) BP: (117-131)/(59-67) 131/67 mmHg (08/06 0537) SpO2:  [95 %-100 %] 95 % (08/06 0537) Weight:  [148 lb 8 oz (67.359 kg)] 148 lb 8 oz (67.359 kg) (08/06 0537) Weight change: -4 lb 1.6 oz (-1.86 kg) Last BM Date: 02/27/14 Intake/Output from previous day: 08/05 0701 - 08/06 0700 In: 200 [I.V.:200] Out: 450 [Urine:450] Intake/Output this shift: Total I/O In: 120 [P.O.:120] Out: -   PE: General:Pleasant affect, NAD Skin:Warm and dry, brisk capillary refill HEENT:normocephalic, sclera clear, mucus membranes moist Heart:S1S2 RRR without murmur, gallup, rub or click Lungs:clear without rales, rhonchi, or wheezes [id number], non tender, + BS, do not palpate liver spleen or masses Ext:no lower ext edema, 2+ pedal pulses, 2+ radial pulses Neuro:alert and oriented, MAE, follows commands, + facial symmetry   Lab Results:  Recent Labs  02/28/14 0400 03/01/14 0618  WBC 11.0* 11.1*  HGB 13.4 12.0  HCT 39.9 35.7*  PLT 235 197   BMET  Recent Labs  02/28/14 0400 03/01/14 0618  NA 141 138  K 4.0 4.3  CL 102 101  CO2 23 25  GLUCOSE 105* 103*  BUN 25* 31*  CREATININE 1.08 1.34*  CALCIUM 9.3 9.8    Recent Labs  02/27/14 2311 02/28/14 0400  TROPONINI <0.30 <0.30    Lab Results  Component Value Date   CHOL 161 03/08/2013   HDL 46 03/08/2013   LDLCALC 79 03/08/2013   TRIG 180* 03/08/2013   CHOLHDL 3.5 03/08/2013   No results found for this basename: HGBA1C     Lab Results  Component Value Date   TSH 7.290* 02/27/2014      Studies/Results: No results found.  Medications: I have reviewed the patient's current medications. Scheduled Meds: . ALPRAZolam  0.5 mg Oral BID  . apixaban  5 mg Oral BID  . aspirin  81 mg Oral Daily  . calcium-vitamin D   2 tablet Oral BID  . diphenhydrAMINE  75 mg Oral BID  . enalapril  10 mg Oral BID  . hydrochlorothiazide  12.5 mg Oral Daily  . metoprolol tartrate  25 mg Oral BID  . multivitamin with minerals  1 tablet Oral Daily  . pantoprazole  40 mg Oral Daily  . rOPINIRole  0.25 mg Oral TID  . simvastatin  40 mg Oral q1800  . traZODone  50 mg Oral QHS   Continuous Infusions:  PRN Meds:.fentaNYL, nitroGLYCERIN, polyethylene glycol  Assessment/Plan: Principal Problem:   Atrial fibrillation with rapid ventricular response- TEE/ DCCV no thrombus-markedly dilated LA 02/28/14  Maintaining SR  Active Problems:   Hypertension- stable   GERD (gastroesophageal reflux disease)- on PPI, stable.  Anticoagulation on Eliquis.   Weakness, PT evaluated no recommendations.      Prob. Discharge.  LOS: 3 days   Time spent with pt. :12 minutes. [facility] R  Nurse Practitioner Certified Pager [phone] or after 5pm and on weekends call [phone] 03/01/2014, 1:07 PM   Patient seen and examined. Agree with assessment and plan. Maintaining NSR; tolerating eliquis. Feels stronger today. Plan dc today.   Troy Sine, MD, [facility] 03/01/2014 1:53 PM

## 2014-03-01 NOTE — Discharge Summary (Signed)
CARDIOLOGY DISCHARGE SUMMARY   Patient ID: Jacqueline [name] MRN: [id number] DOB/AGE: 1930/12/11 80 y.o.  Admit date: 02/26/2014 Discharge date: 03/01/2014  PCP: Zigmund Gottron, MD Primary Cardiologist: Dr. [name]  Primary Discharge Diagnosis:  Atrial fibrillation with rapid ventricular response -  Secondary Discharge Diagnosis:    Hypertension   GERD (gastroesophageal reflux disease)    Anticoagulation with Eliquis  Procedures: TEE, DCCV, 2-D echocardiogram  Hospital Course: ARLOWE [name] is a 80 y.o. female with  history of CAD. She had a sudden onset of chest pain and palpitations. She had shortness of breath with this as well. She came to the emergency room where she was noted to be in atrial fibrillation with rapid ventricular response. She was started on IV Cardizem and admitted for further evaluation and treatment.  She was started on heparin and anticoagulation options were considered. Her CHADsVASc score is elevated and she would be a candidate for oral anticoagulation. Case management to help to research to cost to the patient for different anticoagulation options. The decision was made to put her on Eliquis. She is tolerating this medication well.  She was very symptomatic from the atrial fibrillation and it was felt that she would benefit from restoring sinus rhythm. A 2-D echocardiogram showed normal atrial size, bilaterally. She was started on metoprolol in addition to the Cardizem because her heart rate was not well controlled. The TEE cardioversion was scheduled and performed on 08/05. She tolerated the procedure well and was converted to sinus rhythm with one shock. After the cardioversion she was in sinus rhythm/sinus bradycardia and her blood pressure was stable. The Cardizem was discontinued and she is to remain on the beta blocker.   Initially after the cardioversion she was very weak and having trouble getting out of bed. She was held overnight. On 08/06, she  was evaluated by physical therapy. She had mild balance defects and some mild weakness secondary to her hospitalization. She has a rolling walker at home and is to use that for a short period until balance and strength improve. She does not need physical therapy followup at this time.  On 08/06, she was seen by Dr. Claiborne Billings and all data were reviewed. She has some mild renal insufficiency, but this is similar to previous values and will be followed as an outpatient. She is increasing her ambulation and denies chest pain or shortness of breath. She is maintaining sinus rhythm on a beta blocker. No further inpatient workup is indicated and she is considered stable for discharge, to follow up as an outpatient.  Labs:   Lab Results  Component Value Date   WBC 11.1* 03/01/2014   HGB 12.0 03/01/2014   HCT 35.7* 03/01/2014   MCV 89.0 03/01/2014   PLT 197 03/01/2014     Recent Labs Lab 03/01/14 0618  NA 138  K 4.3  CL 101  CO2 25  BUN 31*  CREATININE 1.34*  CALCIUM 9.8  GLUCOSE 103*    Recent Labs  02/27/14 1650 02/27/14 2311 02/28/14 0400  TROPONINI <0.30 <0.30 <0.30    Recent Labs  02/26/14 2303  INR 1.03      Radiology: Dg Chest Port 1 View 02/26/2014   CLINICAL DATA:  Chest pain.  Shortness of breath.  EXAM: PORTABLE CHEST - 1 VIEW  COMPARISON:  08/16/2013  FINDINGS: No cardiomegaly.  Unchanged aortic tortuosity.  CABG changes noted.  Low lung volumes with interstitial crowding. There is no edema, consolidation, effusion, or pneumothorax.  Right rotator cuff tearing with repair.  IMPRESSION: No active disease.   Electronically Signed   By: Jorje Guild M.D.   On: 02/26/2014 23:28    Transesophageal echocardiogram: 02/28/2014 Conclusions - Left ventricle: The cavity size was normal. There was mild concentric hypertrophy. Systolic function was normal. The estimated ejection fraction was in the range of 60% to 65%. - Aortic valve: No evidence of vegetation. There was  trivial regurgitation. - Left atrium: The atrium was moderately to severely dilated. No evidence of thrombus in the appendage. There was mildintermittent spontaneous echo contrast (&quot;smoke&quot;) in the cavity. - Right atrium: No evidence of thrombus in the atrial cavity or appendage. - Atrial septum: No defect or patent foramen ovale was identified. Echo contrast study showed no right-to-left atrial level shunt, following an increase in RA pressure induced by provocative maneuvers. - Tricuspid valve: No evidence of vegetation. No evidence of vegetation. - Pulmonic valve: No evidence of vegetation.  EKG: 02/28/2014 Sinus bradycardia Vent. rate 57 BPM PR interval 178 ms QRS duration 84 ms QT/QTc 432/420 ms P-R-T axes 53 16 64  Echo: 02/27/2014 Conclusions - Left ventricle: The cavity size was normal. There was moderate concentric hypertrophy. Systolic function was normal. The estimated ejection fraction was in the range of 55% to 60%. Wall motion was normal; there were no regional wall motion abnormalities. - Aortic valve: Trileaflet; mildly thickened leaflets. There was no regurgitation. - Mitral valve: There was mild regurgitation. - Right ventricle: Systolic function was normal. - Right atrium: The atrium was normal in size. - Tricuspid valve: There was mild regurgitation. - Pulmonary arteries: Systolic pressure was within the normal range. Impressions: - Normal biventricular size and function. Mild tricuspid regurgitation.   FOLLOW UP PLANS AND APPOINTMENTS Allergies  Allergen Reactions  . Atorvastatin Other (See Comments)    Myalgias in legs  . Crestor [Rosuvastatin] Other (See Comments)    Myalgias in legs  . Morphine And Related Other (See Comments)    Crazy thoughts     Medication List         ALPRAZolam 0.5 MG tablet  Commonly known as:  XANAX  Take 0.5 mg by mouth 2 (two) times daily.     apixaban 5 MG Tabs tablet  Commonly known as:  ELIQUIS   Take 1 tablet (5 mg total) by mouth 2 (two) times daily.     aspirin 81 MG chewable tablet  Chew 81 mg by mouth daily.     diphenhydrAMINE 25 MG tablet  Commonly known as:  BENADRYL  Take 75 mg by mouth 2 (two) times daily.     enalapril 10 MG tablet  Commonly known as:  VASOTEC  Take 10 mg by mouth 2 (two) times daily.     Fish Oil 1000 MG Caps  Take 1 capsule by mouth 2 (two) times daily. Per Cardiologist     hydrochlorothiazide 25 MG tablet  Commonly known as:  HYDRODIURIL  Take 12.5 mg by mouth daily.     metoprolol tartrate 25 MG tablet  Commonly known as:  LOPRESSOR  Take 1 tablet (25 mg total) by mouth 2 (two) times daily.     multivitamin with minerals Tabs tablet  Take 1 tablet by mouth daily.     naproxen sodium 220 MG tablet  Commonly known as:  ANAPROX  Take 220 mg by mouth daily as needed (for pain).     nitroGLYCERIN 0.4 MG SL tablet  Commonly known as:  NITROSTAT  Place 0.4 mg  under the tongue every 5 (five) minutes as needed for chest pain.     omeprazole 20 MG capsule  Commonly known as:  PRILOSEC  Take 20 mg by mouth daily.     polyethylene glycol packet  Commonly known as:  MIRALAX / GLYCOLAX  Take 17 g by mouth daily as needed for moderate constipation (couple times weekly, depending).     predniSONE 10 MG tablet  Commonly known as:  STERAPRED UNI-PAK  Take 10-60 tablets by mouth daily.     rOPINIRole 0.25 MG tablet  Commonly known as:  REQUIP  Take 0.25 mg by mouth 3 (three) times daily.     simvastatin 40 MG tablet  Commonly known as:  ZOCOR  Take 40 mg by mouth daily at 6 PM.     SM CALCIUM-VITAMIN D 500-200 MG-UNIT per tablet  Generic drug:  calcium-vitamin D  Take 2 tablets by mouth 2 (two) times daily.     traZODone 50 MG tablet  Commonly known as:  DESYREL  Take 50 mg by mouth at bedtime.        Discharge Instructions   Diet - low sodium heart healthy    Complete by:  As directed      Increase activity slowly     Complete by:  As directed           Follow-up Information   Follow up with [name], PA-C On 03/16/2014. (At 2:00 pm)    Specialty:  Cardiology   Contact information:   [address] [place] [address] [phone]       BRING ALL MEDICATIONS WITH YOU TO FOLLOW UP APPOINTMENTS  Time spent with patient to include physician time: 42 min Signed: Rosaria Ferries, PA-C 03/01/2014, 3:05 PM Co-Sign MD

## 2014-03-01 NOTE — Evaluation (Signed)
Physical Therapy Evaluation Patient Details Name: Jacqueline [name] MRN: [id number] DOB: 1931/01/02 Today's Date: 03/01/2014   History of Present Illness  Patient is a 80 y/o female admitted with A-fib with RVR s/p cardioversion 8/5. PMH positive for HTN, CAD s/p CABG, hypokalemia and HLD.   Clinical Impression  Patient presents with mild balance deficits limiting safe mobility secondary to hospitalization. No LOB noted during gait training. Discussed importance of using RW at home for a short period of time until balance and strength improve. Recommend assist to negotiate steps upon entering home initially for safety. Pt agreeable. Pt would benefit from skilled PT in acute setting to improve safe mobility and maximize independence prior to discharge home.    Follow Up Recommendations No PT follow up;Supervision - Intermittent    Equipment Recommendations  None recommended by PT    Recommendations for Other Services       Precautions / Restrictions Precautions Precautions: None Restrictions Weight Bearing Restrictions: No      Mobility  Bed Mobility               General bed mobility comments: Walking in room upon PT arrival.  Transfers Overall transfer level: Needs assistance Equipment used: None Transfers: Sit to/from Stand Sit to Stand: Independent         General transfer comment: Stood x3 from EOB, ambulated into bathroom Mod I  Ambulation/Gait Ambulation/Gait assistance: Supervision Ambulation Distance (Feet): 300 Feet Assistive device: None Gait Pattern/deviations: Step-through pattern;Narrow base of support;Decreased stride length Gait velocity: 1.9 ft/sec   General Gait Details: Lack of arm swing noted RUE. Mildly unsteady initially however improved with increased distance. No LOB.  Stairs Stairs: Yes Stairs assistance: Supervision Stair Management: One rail Right;Step to pattern Number of Stairs: 3 General stair comments: Supervision for safety.  Guarded speed negotiating steps.  Wheelchair Mobility    Modified Rankin (Stroke Patients Only)       Balance Overall balance assessment: Needs assistance   Sitting balance-Leahy Scale: Good       Standing balance-Leahy Scale: Fair                   Standardized Balance Assessment Standardized Balance Assessment : TUG: Timed Up and Go Test     Timed Up and Go Test TUG: Normal TUG Normal TUG (seconds): 14.4     Pertinent Vitals/Pain No pain reported. No SOB present post gait training/exercise. Normal increase in HR during exercise.     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     Hand Dominance   Dominant Hand: Right    Extremity/Trunk Assessment   Upper Extremity Assessment: Overall WFL for tasks assessed           Lower Extremity Assessment: Overall WFL for tasks assessed         Communication   Communication: No difficulties  Cognition Arousal/Alertness: Awake/alert Behavior During Therapy: WFL for tasks assessed/performed Overall Cognitive Status: Within Functional Limits for tasks assessed  General Comments      Exercises        Assessment/Plan    PT Assessment Patient needs continued PT services  PT Diagnosis Difficulty walking   PT Problem List Cardiopulmonary status limiting activity;Decreased activity tolerance;Decreased balance;Decreased mobility;Decreased knowledge of precautions;Decreased safety awareness  PT Treatment  Interventions Balance training;Gait training;Stair training;Functional mobility training;Patient/family education;Therapeutic activities;Therapeutic exercise   PT Goals (Current goals can be found in the Care Plan section) Acute Rehab PT Goals Patient Stated Goal: to get back home PT Goal Formulation: With patient Time For Goal Achievement: 03/15/14 Potential to Achieve Goals: Good    Frequency Min 3X/week   Barriers to discharge Decreased caregiver support      Co-evaluation               End of Session Equipment Utilized During Treatment: Gait belt Activity Tolerance: Patient tolerated treatment well Patient left: in bed;with call bell/phone within reach Nurse Communication: Mobility status         Time: [id number] PT Time Calculation (min): 20 min   Charges:   PT Evaluation $Initial PT Evaluation Tier I: 1 Procedure PT Treatments $Gait Training: 8-22 mins   PT G CodesCandy Sledge A 03/01/2014, 12:01 PM Candy Sledge, [place], DPT [phone]

## 2014-03-14 ENCOUNTER — Ambulatory Visit (INDEPENDENT_AMBULATORY_CARE_PROVIDER_SITE_OTHER): Payer: Medicare Other | Admitting: Family Medicine

## 2014-03-14 ENCOUNTER — Encounter: Payer: Self-pay | Admitting: Family Medicine

## 2014-03-14 VITALS — BP 148/78 | HR 62 | Temp 98.2°F | Ht 61.0 in | Wt 152.0 lb

## 2014-03-14 DIAGNOSIS — M8580 Other specified disorders of bone density and structure, unspecified site: Secondary | ICD-10-CM | POA: Insufficient documentation

## 2014-03-14 DIAGNOSIS — M949 Disorder of cartilage, unspecified: Secondary | ICD-10-CM

## 2014-03-14 DIAGNOSIS — I482 Chronic atrial fibrillation, unspecified: Secondary | ICD-10-CM

## 2014-03-14 DIAGNOSIS — I4891 Unspecified atrial fibrillation: Secondary | ICD-10-CM

## 2014-03-14 DIAGNOSIS — I519 Heart disease, unspecified: Secondary | ICD-10-CM

## 2014-03-14 DIAGNOSIS — M899 Disorder of bone, unspecified: Secondary | ICD-10-CM

## 2014-03-14 DIAGNOSIS — N182 Chronic kidney disease, stage 2 (mild): Secondary | ICD-10-CM

## 2014-03-14 DIAGNOSIS — D649 Anemia, unspecified: Secondary | ICD-10-CM

## 2014-03-14 DIAGNOSIS — E7800 Pure hypercholesterolemia, unspecified: Secondary | ICD-10-CM

## 2014-03-14 DIAGNOSIS — M1990 Unspecified osteoarthritis, unspecified site: Secondary | ICD-10-CM

## 2014-03-14 DIAGNOSIS — I10 Essential (primary) hypertension: Secondary | ICD-10-CM

## 2014-03-14 DIAGNOSIS — I2510 Atherosclerotic heart disease of native coronary artery without angina pectoris: Secondary | ICD-10-CM

## 2014-03-14 LAB — CBC
HCT: 33.4 % — ABNORMAL LOW (ref 36.0–46.0)
HEMOGLOBIN: 11.4 g/dL — AB (ref 12.0–15.0)
MCH: 30 pg (ref 26.0–34.0)
MCHC: 34.1 g/dL (ref 30.0–36.0)
MCV: 87.9 fL (ref 78.0–100.0)
Platelets: 231 10*3/uL (ref 150–400)
RBC: 3.8 MIL/uL — ABNORMAL LOW (ref 3.87–5.11)
RDW: 14.9 % (ref 11.5–15.5)
WBC: 5.2 10*3/uL (ref 4.0–10.5)

## 2014-03-14 MED ORDER — TRAMADOL HCL 50 MG PO TABS: 50.0000 mg | ORAL_TABLET | Freq: Two times a day (BID) | ORAL | Status: DC

## 2014-03-14 NOTE — Patient Instructions (Signed)
You look good. I will give you a new prescription for a pain pill to help your arthritis feel better. I want you to have one more bone density test. I will call with blood work and bone density test results. Stay on your same medications. We get our flu shots in by Oct.  Get a flu shot. Otherwise, if things are going well, I will see you in 6 months.

## 2014-03-15 LAB — LIPID PANEL
Cholesterol: 175 mg/dL (ref 0–200)
HDL: 45 mg/dL (ref >39)
LDL Cholesterol: 82 mg/dL (ref 0–99)
Total CHOL/HDL Ratio: 3.9 Ratio
Triglycerides: 240 mg/dL — ABNORMAL HIGH (ref <150)
VLDL: 48 mg/dL — AB (ref 0–40)

## 2014-03-15 LAB — VITAMIN D 25 HYDROXY (VIT D DEFICIENCY, FRACTURES): Vit D, 25-Hydroxy: 79 ng/mL (ref 30–89)

## 2014-03-15 LAB — BASIC METABOLIC PANEL
BUN: 30 mg/dL — AB (ref 6–23)
CHLORIDE: 100 meq/L (ref 96–112)
CO2: 25 mEq/L (ref 19–32)
Calcium: 9.5 mg/dL (ref 8.4–10.5)
Creat: 1.37 mg/dL — ABNORMAL HIGH (ref 0.50–1.10)
GLUCOSE: 95 mg/dL (ref 70–99)
POTASSIUM: 4.5 meq/L (ref 3.5–5.3)
Sodium: 139 mEq/L (ref 135–145)

## 2014-03-15 NOTE — Progress Notes (Signed)
   Subjective:    Patient ID: Jacqueline [name], female    DOB: 1930-11-30, 80 y.o.   MRN: [id number]  HPI  Follow up of her multiple medical problems.  Seems to be doing well on current medications.  Denies any adverse symptoms.  Does state that tylenol alone is insufficient for her chronic arthritis pain.   Follow up hospital atrial fib.  On apixaban.  No bleeding.  No sense of palpitations at this time. HPDP, Will need flu shot.  Has not had bone density in 5 years.  Otherwise up to date.    Review of Systems     Objective:   Physical Exam VS including wt noted.  Pulse is normal rate but irregula. Lungs clear Cardiac irreg irreg with normal rhythm and 1/6 SEM Ext no edema       Assessment & Plan:

## 2014-03-15 NOTE — Assessment & Plan Note (Signed)
Stable with no evidence of volume overload.

## 2014-03-15 NOTE — Assessment & Plan Note (Signed)
Check bone density to see if needs bisphosphonate.

## 2014-03-15 NOTE — Assessment & Plan Note (Signed)
Check labs 

## 2014-03-15 NOTE — Assessment & Plan Note (Signed)
Add tramadol and take with APAP

## 2014-03-15 NOTE — Assessment & Plan Note (Signed)
Chronic stable A fib with rate controled.

## 2014-03-15 NOTE — Assessment & Plan Note (Signed)
Good control

## 2014-03-16 ENCOUNTER — Ambulatory Visit (INDEPENDENT_AMBULATORY_CARE_PROVIDER_SITE_OTHER): Payer: Medicare Other | Admitting: Cardiology

## 2014-03-16 VITALS — BP 120/70 | HR 65 | Ht 61.0 in | Wt 150.0 lb

## 2014-03-16 DIAGNOSIS — I4819 Other persistent atrial fibrillation: Secondary | ICD-10-CM

## 2014-03-16 DIAGNOSIS — I4891 Unspecified atrial fibrillation: Secondary | ICD-10-CM

## 2014-03-16 DIAGNOSIS — E878 Other disorders of electrolyte and fluid balance, not elsewhere classified: Secondary | ICD-10-CM

## 2014-03-16 NOTE — Anesthesia Postprocedure Evaluation (Signed)
  Anesthesia Post-op Note  Patient: Jacqueline [name]  Procedure(s) Performed: Procedure(s): TRANSESOPHAGEAL ECHOCARDIOGRAM (TEE) (N/A) CARDIOVERSION (N/A)  Patient Location: Endoscopy Unit  Anesthesia Type:General  Level of Consciousness: awake, alert  and oriented  Airway and Oxygen Therapy: Patient Spontanous Breathing and Patient connected to nasal cannula oxygen  Post-op Pain: none  Post-op Assessment: Post-op Vital signs reviewed, Respiratory Function Stable, Patent Airway and Pain level controlled  Post-op Vital Signs: stable  Last Vitals:  Filed Vitals:   03/01/14 1326  BP: 122/60  Pulse: 73  Temp: 37.1 C  Resp: 18    Complications: No apparent anesthesia complications

## 2014-03-16 NOTE — Progress Notes (Signed)
03/16/2014 Jacqueline [name]   1931/05/06  [id number]  Primary Physicia Zigmund Gottron, MD Primary Cardiologist: Dr. [name]  The patient presents to clinic today for post hospital followup. Details regarding her history and recent hospital course are outlined in detail below.  HPI:  The patient is an 80 year old female with prior history of CAD (s/p CABG x2 LIMA-LAD and SVG-D2 10 years ago w/ normal perfusion study in 2013), followed by Dr. [name].  She presented to [facility] on 03/01/14 with sudden onset of chest pain, palpitations and dyspnea. In the ER, she was noted to be in atrial fibrillation with rapid ventricular response. Cardiac enzymes were negative. TSH was elevated at 7.29, however free T4 was WNL at 1.48 (RR [0.80-1.80]). She was started on IV Cardizem and admitted for further evaluation and treatment. She was started on heparin and anticoagulation options were considered. Due to an elevated CHADsVASc score, she  was placed on Eliquis.  She was very symptomatic from the atrial fibrillation and it was felt that she would benefit from restoring sinus rhythm. A 2-D echocardiogram showed normal atrial size, bilaterally and normal systolic function with a estimated EF of 55-60%. She was started on metoprolol in addition to the Cardizem because her heart rate was not well controlled. She ultimately underwent a TEE which was negative for a LA clot and underwent subsequent DCCV. She was successfully converted back to SR after one shock. After the cardioversion she was in sinus rhythm/sinus bradycardia and her blood pressure was stable. The Cardizem was discontinued and she continued on beta blocker therapy.  She presents back to clinic today for f/u. She states that she has been doing well. She denies any other symptoms. Her palpitations, chest discomfort and dyspnea have resolved. No weakness, fatigue, dizziness, syncope/ near syncope.  She reports full medication compliance. She denies any  abnormal bleeding with Eliquis. She also denies falls.   Her EKG today demonstrates SR with PACs. HR is 65 bpm. BP is controlled and stable at 120/70.   Current Outpatient Prescriptions  Medication Sig Dispense Refill  . ALPRAZolam (XANAX) 0.5 MG tablet Take 0.5 mg by mouth 2 (two) times daily.      Marland Kitchen apixaban (ELIQUIS) 5 MG TABS tablet Take 1 tablet (5 mg total) by mouth 2 (two) times daily.  180 tablet  3  . aspirin 81 MG chewable tablet Chew 81 mg by mouth daily.        . calcium-vitamin D (SM CALCIUM-VITAMIN D) 500-200 MG-UNIT per tablet Take 2 tablets by mouth 2 (two) times daily.       . diphenhydrAMINE (BENADRYL) 25 MG tablet Take 75 mg by mouth 2 (two) times daily.       . enalapril (VASOTEC) 10 MG tablet Take 10 mg by mouth 2 (two) times daily.      . hydrochlorothiazide (HYDRODIURIL) 25 MG tablet Take 12.5 mg by mouth daily.      . metoprolol tartrate (LOPRESSOR) 25 MG tablet Take 1 tablet (25 mg total) by mouth 2 (two) times daily.  180 tablet  3  . Multiple Vitamin (MULTIVITAMIN WITH MINERALS) TABS tablet Take 1 tablet by mouth daily.      . naproxen sodium (ANAPROX) 220 MG tablet Take 220 mg by mouth daily as needed (for pain).      . nitroGLYCERIN (NITROSTAT) 0.4 MG SL tablet Place 0.4 mg under the tongue every 5 (five) minutes as needed for chest pain.      . Omega-3 Fatty  Acids (FISH OIL) 1000 MG CAPS Take 1 capsule by mouth 2 (two) times daily. Per Cardiologist      . omeprazole (PRILOSEC) 20 MG capsule Take 20 mg by mouth daily.      . polyethylene glycol (MIRALAX / GLYCOLAX) packet Take 17 g by mouth daily as needed for moderate constipation (couple times weekly, depending).      Marland Kitchen rOPINIRole (REQUIP) 0.25 MG tablet Take 0.25 mg by mouth 3 (three) times daily.      . simvastatin (ZOCOR) 40 MG tablet Take 40 mg by mouth daily at 6 PM.      . traMADol (ULTRAM) 50 MG tablet Take 1 tablet (50 mg total) by mouth 2 (two) times daily.  60 tablet  5  . traZODone (DESYREL) 50 MG  tablet Take 50 mg by mouth at bedtime.       No current facility-administered medications for this visit.    Allergies  Allergen Reactions  . Atorvastatin Other (See Comments)    Myalgias in legs  . Crestor [Rosuvastatin] Other (See Comments)    Myalgias in legs  . Morphine And Related Other (See Comments)    Crazy thoughts    History   Social History  . Marital Status: Widowed    Spouse Name: N/A    Number of Children: N/A  . Years of Education: N/A   Occupational History  . Not on file.   Social History Main Topics  . Smoking status: Never Smoker   . Smokeless tobacco: Never Used  . Alcohol Use: No  . Drug Use: No  . Sexual Activity: No   Other Topics Concern  . Not on file   Social History Narrative  . No narrative on file     Review of Systems: General: negative for chills, fever, night sweats or weight changes.  Cardiovascular: negative for chest pain, dyspnea on exertion, edema, orthopnea, palpitations, paroxysmal nocturnal dyspnea or shortness of breath Dermatological: negative for rash Respiratory: negative for cough or wheezing Urologic: negative for hematuria Abdominal: negative for nausea, vomiting, diarrhea, bright red blood per rectum, melena, or hematemesis Neurologic: negative for visual changes, syncope, or dizziness All other systems reviewed and are otherwise negative except as noted above.    Blood pressure 120/70, pulse 65, height 5\' 1"  (1.549 m), weight 150 lb (68.04 kg).  General appearance: alert, cooperative and no distress Neck: no carotid bruit and no JVD Lungs: clear to auscultation bilaterally Heart: regular rate and rhythm, S1, S2 normal, no murmur, click, rub or gallop Extremities: no LEE Pulses: 2+ and symmetric Skin: warm and dry Neurologic: Grossly normal  EKG SR with PACs 65 bpm  ASSESSMENT AND PLAN:   1. Atrial Fibrillation: S/p TEE guided DCCV. EKG demonstrates SR with occasional PACs. HR in the mid 60s. Continue  Lopressor for rate control and Eliquis for stroke prophylaxis.   2. Chronic Oral Anticoagulation: For PAF. CHADS-VASC score is 4.  She is on Eliquis. She denies abnormal bleeding. Continue 5 mg daily.  PLAN  F/u with Dr [name] in 6-8 weeks for reassessment.   Areyanna Figeroa, BRITTAINYPA-C 03/16/2014 2:32 PM

## 2014-03-16 NOTE — Patient Instructions (Signed)
Continue current medications as prescribed. Follow-up with Dr. [name] in 6 weeks for reassessment. Call our office if any recurrence in symptoms or any abnormal bleeding or falls.

## 2014-03-20 ENCOUNTER — Telehealth: Payer: Self-pay | Admitting: Cardiology

## 2014-03-20 NOTE — Telephone Encounter (Signed)
Pt said her nitroglycerin was suppose to have been called in,still not there. Please call it to [phone].

## 2014-03-21 ENCOUNTER — Ambulatory Visit
Admission: RE | Admit: 2014-03-21 | Discharge: 2014-03-21 | Disposition: A | Discharge status: Home / Self Care | Payer: Medicare Other | Source: Ambulatory Visit | Attending: Family Medicine | Admitting: Family Medicine

## 2014-03-21 ENCOUNTER — Encounter: Payer: Self-pay | Admitting: Cardiology

## 2014-03-21 DIAGNOSIS — M8580 Other specified disorders of bone density and structure, unspecified site: Secondary | ICD-10-CM

## 2014-03-21 MED ORDER — NITROGLYCERIN 0.4 MG SL SUBL: 0.4000 mg | SUBLINGUAL_TABLET | SUBLINGUAL | Status: DC | PRN

## 2014-03-21 NOTE — Telephone Encounter (Signed)
Rx refill sent to patient pharmacy   

## 2014-03-22 ENCOUNTER — Other Ambulatory Visit: Payer: Self-pay | Admitting: Family Medicine

## 2014-03-22 DIAGNOSIS — M810 Age-related osteoporosis without current pathological fracture: Secondary | ICD-10-CM | POA: Insufficient documentation

## 2014-03-22 MED ORDER — ALENDRONATE SODIUM 70 MG PO TABS: 70.0000 mg | ORAL_TABLET | ORAL | Status: DC

## 2014-03-22 NOTE — Progress Notes (Signed)
States that despite GERD, Has no trouble swallowing.  Discussed how to take and she wants to try.

## 2014-05-03 ENCOUNTER — Telehealth: Payer: Self-pay | Admitting: Cardiovascular Disease

## 2014-05-03 NOTE — Telephone Encounter (Signed)
Jacqueline [name] is calling because she had some chest tightness and pain . When she checked her bp it went up to 220 just before she took the nitro  Then after the nitro it drop back down to 90/65 and pulse is 76 then it went to 98/63 and pulse was 72 and she says she does not know how to tell if she is in afib .Marland Kitchen Please call   Thanks

## 2014-05-03 NOTE — Telephone Encounter (Signed)
Spoke with pt, she started having tightness in her chest while at work. She got home and her bp was 190-220.  She has taken 4 NTG and her bp is now down to 98/63. She felt like this previously when she was out of rhythm. Her pulse is fine. Encouraged pt to go to the ER. She reports she is going to rest and if it does not get any better, she will call 911.

## 2014-05-07 ENCOUNTER — Ambulatory Visit: Payer: Medicare Other | Admitting: Cardiovascular Disease

## 2014-05-09 ENCOUNTER — Other Ambulatory Visit (HOSPITAL_COMMUNITY): Payer: Self-pay | Admitting: Orthopedic Surgery

## 2014-05-09 ENCOUNTER — Ambulatory Visit (INDEPENDENT_AMBULATORY_CARE_PROVIDER_SITE_OTHER): Payer: Medicare Other | Admitting: Cardiovascular Disease

## 2014-05-09 ENCOUNTER — Ambulatory Visit (INDEPENDENT_AMBULATORY_CARE_PROVIDER_SITE_OTHER): Payer: Medicare Other | Admitting: *Deleted

## 2014-05-09 ENCOUNTER — Encounter: Payer: Self-pay | Admitting: Cardiovascular Disease

## 2014-05-09 VITALS — BP 116/64 | HR 62 | Resp 16 | Ht 61.0 in | Wt 153.2 lb

## 2014-05-09 DIAGNOSIS — I10 Essential (primary) hypertension: Secondary | ICD-10-CM

## 2014-05-09 DIAGNOSIS — I25708 Atherosclerosis of coronary artery bypass graft(s), unspecified, with other forms of angina pectoris: Secondary | ICD-10-CM

## 2014-05-09 DIAGNOSIS — E7800 Pure hypercholesterolemia, unspecified: Secondary | ICD-10-CM

## 2014-05-09 DIAGNOSIS — Z23 Encounter for immunization: Secondary | ICD-10-CM

## 2014-05-09 DIAGNOSIS — I480 Paroxysmal atrial fibrillation: Secondary | ICD-10-CM

## 2014-05-09 DIAGNOSIS — I519 Heart disease, unspecified: Secondary | ICD-10-CM

## 2014-05-09 DIAGNOSIS — M2341 Loose body in knee, right knee: Secondary | ICD-10-CM

## 2014-05-09 NOTE — Patient Instructions (Signed)
Your physician has recommended you make the following change in your medication: stop your aspirin.   Your physician wants you to follow-up in: 6 months. You will receive a reminder letter in the mail two months in advance. If you don't receive a letter, please call our office to schedule the follow-up appointment.

## 2014-05-13 ENCOUNTER — Encounter: Payer: Self-pay | Admitting: Cardiovascular Disease

## 2014-05-13 NOTE — Progress Notes (Signed)
Patient ID: Jacqueline [name], female   DOB: [birth year], 80 y.o.   MRN: [id number]     Reason for office visit Coronary artery disease, atrial fibrillation  Jacqueline [name] is 80 years old and is roughly 10 years status post coronary artery bypass surgery (LIMA to LAD and SVG to diagonal 2, Dr. Servando Snare, atretic LIMA but good distal flow via the other conduits) and has normal left ventricular systolic function and a relatively recent normal nuclear perfusion study. She had highly symptomatic atrial fibrillation rapid ventricular response in August and underwent CT-guided cardioversion followed by treatment with Eliquis. She does not have a history of stroke or TIA. Atrial fibrillation has not recurred since. About a week ago she had an isolated episode of brief shortness of breath and mild chest discomfort that resolved after a single sublingual nitroglycerin tablet.   Allergies  Allergen Reactions  . Atorvastatin Other (See Comments)    Myalgias in legs  . Crestor [Rosuvastatin] Other (See Comments)    Myalgias in legs  . Morphine And Related Other (See Comments)    Crazy thoughts    Current Outpatient Prescriptions  Medication Sig Dispense Refill  . alendronate (FOSAMAX) 70 MG tablet Take 1 tablet (70 mg total) by mouth once a week. Take with a full glass of water on an empty stomach, remain upright  12 tablet  3  . ALPRAZolam (XANAX) 0.5 MG tablet Take 0.5 mg by mouth 2 (two) times daily.      Marland Kitchen apixaban (ELIQUIS) 5 MG TABS tablet Take 1 tablet (5 mg total) by mouth 2 (two) times daily.  180 tablet  3  . calcium-vitamin D (SM CALCIUM-VITAMIN D) 500-200 MG-UNIT per tablet Take 2 tablets by mouth 2 (two) times daily.       . diphenhydrAMINE (BENADRYL) 25 MG tablet Take 75 mg by mouth 2 (two) times daily.       . enalapril (VASOTEC) 10 MG tablet Take 10 mg by mouth 2 (two) times daily.      . hydrochlorothiazide (HYDRODIURIL) 25 MG tablet Take 12.5 mg by mouth daily.      . metoprolol tartrate  (LOPRESSOR) 25 MG tablet Take 1 tablet (25 mg total) by mouth 2 (two) times daily.  180 tablet  3  . Multiple Vitamin (MULTIVITAMIN WITH MINERALS) TABS tablet Take 1 tablet by mouth daily.      . naproxen sodium (ANAPROX) 220 MG tablet Take 220 mg by mouth daily as needed (for pain).      . nitroGLYCERIN (NITROSTAT) 0.4 MG SL tablet Place 1 tablet (0.4 mg total) under the tongue every 5 (five) minutes as needed for chest pain.  25 tablet  2  . Omega-3 Fatty Acids (FISH OIL) 1000 MG CAPS Take 1 capsule by mouth 2 (two) times daily. Per Cardiologist      . omeprazole (PRILOSEC) 20 MG capsule Take 20 mg by mouth daily.      . polyethylene glycol (MIRALAX / GLYCOLAX) packet Take 17 g by mouth daily as needed for moderate constipation (couple times weekly, depending).      Marland Kitchen rOPINIRole (REQUIP) 0.25 MG tablet Take 0.25 mg by mouth 3 (three) times daily.      . simvastatin (ZOCOR) 40 MG tablet Take 40 mg by mouth daily at 6 PM.      . traMADol (ULTRAM) 50 MG tablet Take 1 tablet (50 mg total) by mouth 2 (two) times daily.  60 tablet  5  . traZODone (DESYREL) 50 MG  tablet Take 50 mg by mouth at bedtime.       No current facility-administered medications for this visit.    Past Medical History  Diagnosis Date  . Hypertension   . CAD (coronary artery disease)   . History of stress test     Normal perfusion study which shows normal perfusion throughout and an EF 77%  . Hx of echocardiogram     Showed mild left ventricular hypertrophy, perserved left ventricular systolic function, evidence of diastolic dysfunction, and a severly dilated left atrium, but minimal valvular abnormalities.  . Hyperlipemia   . Insomnia   . Diverticulitis   . GERD (gastroesophageal reflux disease)   . Atrial fibrillation with rapid ventricular response 02/2014    Past Surgical History  Procedure Laterality Date  . Coronary artery bypass graft  2006    off-pump bypass surgery with LIMA to the LAD and SVG to the second  diagonal artery ( performed by Dr Servando Snare)   . Joint replacement    . Abdominal hysterectomy    . Tee without cardioversion N/A 02/28/2014    Procedure: TRANSESOPHAGEAL ECHOCARDIOGRAM (TEE);  Surgeon: Sanda Klein, MD;  Location: [place];  Service: Cardiovascular;  Laterality: N/A;  . Cardioversion N/A 02/28/2014    Procedure: CARDIOVERSION;  Surgeon: Sanda Klein, MD;  Location: [facility];  Service: Cardiovascular;  Laterality: N/A;    Family History  Problem Relation Age of Onset  . Parkinson's disease Mother     History   Social History  . Marital Status: Widowed    Spouse Name: N/A    Number of Children: N/A  . Years of Education: N/A   Occupational History  . Not on file.   Social History Main Topics  . Smoking status: Never Smoker   . Smokeless tobacco: Never Used  . Alcohol Use: No  . Drug Use: No  . Sexual Activity: No   Other Topics Concern  . Not on file   Social History Narrative  . No narrative on file    Review of systems: The patient specifically denies any chest pain at rest or with exertion, dyspnea at rest or with exertion, orthopnea, paroxysmal nocturnal dyspnea, syncope, palpitations, focal neurological deficits, intermittent claudication, lower extremity edema, unexplained weight gain, cough, hemoptysis or wheezing.  The patient also denies abdominal pain, nausea, vomiting, dysphagia, diarrhea, constipation, polyuria, polydipsia, dysuria, hematuria, frequency, urgency, abnormal bleeding or bruising, fever, chills, unexpected weight changes, mood swings, change in skin or hair texture, change in voice quality, auditory or visual problems, allergic reactions or rashes, new musculoskeletal complaints other than usual "aches and pains".   PHYSICAL EXAM BP 116/64  Pulse 62  Resp 16  Ht 5\' 1"  (1.549 m)  Wt 69.491 kg (153 lb 3.2 oz)  BMI 28.96 kg/m2 General: Alert, oriented x3, no distress  Head: no evidence of trauma, PERRL, EOMI, no  exophtalmos or lid lag, no myxedema, no xanthelasma; normal ears, nose and oropharynx  Neck: normal jugular venous pulsations and no hepatojugular reflux; brisk carotid pulses without delay and no carotid bruits  Chest: clear to auscultation, no signs of consolidation by percussion or palpation, normal fremitus, symmetrical and full respiratory excursions, healed sternotomy scar  Cardiovascular: normal position and quality of the apical impulse, regular rhythm, normal first and second heart sounds, no murmurs, rubs or gallops  Abdomen: no tenderness or distention, no masses by palpation, no abnormal pulsatility or arterial bruits, normal bowel sounds, no hepatosplenomegaly  Extremities: no clubbing, cyanosis or edema; 2+  radial, ulnar and brachial pulses bilaterally; 2+ right femoral, posterior tibial and dorsalis pedis pulses; 2+ left femoral, posterior tibial and dorsalis pedis pulses; no subclavian or femoral bruits  Neurological: grossly nonfocal   EKG: Sinus rhythm,  inverted T waves in leads 1 and aVL, little changed from older tracings.  Lipid Panel     Component Value Date/Time   CHOL 175 03/14/2014 1508   TRIG 240* 03/14/2014 1508   HDL 45 03/14/2014 1508   CHOLHDL 3.9 03/14/2014 1508   VLDL 48* 03/14/2014 1508   LDLCALC 82 03/14/2014 1508    BMET    Component Value Date/Time   NA 139 03/14/2014 1508   K 4.5 03/14/2014 1508   CL 100 03/14/2014 1508   CO2 25 03/14/2014 1508   GLUCOSE 95 03/14/2014 1508   BUN 30* 03/14/2014 1508   CREATININE 1.37* 03/14/2014 1508   CREATININE 1.34* 03/01/2014 0618   CALCIUM 9.5 03/14/2014 1508   GFRNONAA 36* 03/01/2014 0618   GFRNONAA 35* 03/08/2013 1410   GFRAA 42* 03/01/2014 0618   GFRAA 41* 03/08/2013 1410     ASSESSMENT AND PLAN Hypercholesterolemia Fair control. Ideally her LDL cholesterol be less than 70 and her triglycerides less than 150.  HYPERTENSION, BENIGN SYSTEMIC  Good control  CORONARY, ARTERIOSCLEROSIS  Asymptomatic and with a fairly  recent low risk nuclear perfusion study. I'm not sure what to make of her brief episode of discomfort last week that was nitroglycerin responsive. Consider repeating a nuclear stress test if it happens again Diastolic dysfunction, left ventricle  No signs or symptoms of congestive heart failure Paroxysmal atrial fibrillation 1 single episode in August of 2015. Tolerating anticoagulants without complications. Stop aspirin while she is taking the anticoagulant.  Patient Instructions  Your physician has recommended you make the following change in your medication: stop your aspirin.   Your physician wants you to follow-up in: 6 months. You will receive a reminder letter in the mail two months in advance. If you don't receive a letter, please call our office to schedule the follow-up appointment.    Holli Humbles, MD, [place] [phone] office [phone] pager

## 2014-05-17 ENCOUNTER — Encounter (HOSPITAL_COMMUNITY)
Admission: RE | Admit: 2014-05-17 | Discharge: 2014-05-17 | Disposition: A | Discharge status: Home / Self Care | Payer: Medicare Other | Source: Ambulatory Visit | Attending: Orthopedic Surgery | Admitting: Orthopedic Surgery

## 2014-05-17 DIAGNOSIS — M2341 Loose body in knee, right knee: Secondary | ICD-10-CM | POA: Diagnosis not present

## 2014-05-17 MED ORDER — TECHNETIUM TC 99M MEDRONATE IV KIT
25.0000 | PACK | Freq: Once | INTRAVENOUS | Status: AC | PRN
  Administered 2014-05-17: 25 via INTRAVENOUS

## 2014-05-21 ENCOUNTER — Other Ambulatory Visit: Payer: Self-pay | Admitting: *Deleted

## 2014-05-21 MED ORDER — TRAZODONE HCL 50 MG PO TABS: 50.0000 mg | ORAL_TABLET | Freq: Every day | ORAL | Status: DC

## 2014-05-21 MED ORDER — HYDROCHLOROTHIAZIDE 25 MG PO TABS: 12.5000 mg | ORAL_TABLET | Freq: Every day | ORAL | Status: DC

## 2014-05-21 MED ORDER — OMEPRAZOLE 20 MG PO CPDR: 20.0000 mg | DELAYED_RELEASE_CAPSULE | Freq: Every day | ORAL | Status: DC

## 2014-05-21 MED ORDER — ALPRAZOLAM 0.5 MG PO TABS: 0.5000 mg | ORAL_TABLET | Freq: Two times a day (BID) | ORAL | Status: DC

## 2014-05-21 MED ORDER — SIMVASTATIN 40 MG PO TABS: 40.0000 mg | ORAL_TABLET | Freq: Every day | ORAL | Status: DC

## 2014-05-21 NOTE — Telephone Encounter (Signed)
Faxed referral in

## 2014-05-31 ENCOUNTER — Telehealth: Payer: Self-pay | Admitting: Cardiovascular Disease

## 2014-05-31 NOTE — Telephone Encounter (Signed)
Please call, pt says he need clarence for knee surgery.

## 2014-05-31 NOTE — Telephone Encounter (Signed)
Pt is scheduled for a right total knee by Dr Paralee Cancel 12/7.  Will forward to Dr [name] for review and clearance.

## 2014-06-04 ENCOUNTER — Other Ambulatory Visit: Payer: Self-pay | Admitting: *Deleted

## 2014-06-04 DIAGNOSIS — I10 Essential (primary) hypertension: Secondary | ICD-10-CM

## 2014-06-04 MED ORDER — METOPROLOL TARTRATE 25 MG PO TABS: 25.0000 mg | ORAL_TABLET | Freq: Two times a day (BID) | ORAL | Status: DC

## 2014-06-04 MED ORDER — ROPINIROLE HCL 0.25 MG PO TABS: 0.2500 mg | ORAL_TABLET | Freq: Three times a day (TID) | ORAL | Status: DC

## 2014-06-04 NOTE — Telephone Encounter (Signed)
Returned call to patient she stated she needed cardiac clearance for right knee replacement scheduled for 07/02/14 with Dr.Matthew Alvan Dame.Patient wants to be called back and wants note faxed to Dr.Olin.Message sent to Dr.Croitoru.

## 2014-06-04 NOTE — Telephone Encounter (Signed)
Pt called again,wants to know if Dr C have approved her clarence for surgery.If she is not at her home number,please [phone]

## 2014-06-06 NOTE — Telephone Encounter (Signed)
Please inform Ms. Bart that Dr. Loletha Grayer is out of town this week, but will provide clearance when he returns. There should be plenty of time if her surgery is 12/7.  Dr. Lemmie Evens

## 2014-06-07 NOTE — Telephone Encounter (Signed)
Returned call to patient Dr.Croitoru out of office this week he will give surgical clearance when he returns.

## 2014-06-07 NOTE — Telephone Encounter (Signed)
Surgical clearance form will be filled out once Dr. Loletha Grayer returns to the office next week.

## 2014-06-08 ENCOUNTER — Encounter: Payer: Self-pay | Admitting: Cardiovascular Disease

## 2014-06-08 ENCOUNTER — Telehealth: Payer: Self-pay | Admitting: *Deleted

## 2014-06-08 NOTE — Telephone Encounter (Signed)
-----   Message from Sanda Klein, MD sent at 06/08/2014  9:33 AM EST ----- Her clearance letter for Dr. Alvan Dame is ready

## 2014-06-08 NOTE — Telephone Encounter (Signed)
Cardiac clearance faxed to Dr. Alvan Dame - low risk and OK to hold Eliquis 48 hours prior to procedure and restart after procedure.  Instructed not to interrupt Metoprolol during the perioperative period.

## 2014-06-12 ENCOUNTER — Telehealth: Payer: Self-pay | Admitting: Family Medicine

## 2014-06-12 NOTE — Telephone Encounter (Signed)
[place] regarding refill on metroprolol tartrate.  Medication was approved on 06/04/2014, but stated print.  Refill verbally given to pharmacist at [facility].  metoprolol tartrate (LOPRESSOR) 25 MG tablet; Take 1 tablet (25 mg total) by mouth 2 (two) times daily; dispense 180 tablets; 3 refills.  Derl Barrow, RN

## 2014-06-12 NOTE — Telephone Encounter (Signed)
[facility] called pt and told her the metrophol  (sp) was not approved by dr Andria Frames to be refilled Please advise

## 2014-06-25 ENCOUNTER — Encounter (HOSPITAL_COMMUNITY)
Admission: RE | Admit: 2014-06-25 | Discharge: 2014-06-25 | Disposition: A | Discharge status: Home / Self Care | Payer: Medicare Other | Source: Ambulatory Visit | Attending: Orthopedic Surgery | Admitting: Orthopedic Surgery

## 2014-06-25 ENCOUNTER — Encounter (HOSPITAL_COMMUNITY): Payer: Self-pay

## 2014-06-25 DIAGNOSIS — Z01812 Encounter for preprocedural laboratory examination: Secondary | ICD-10-CM | POA: Diagnosis present

## 2014-06-25 HISTORY — DX: Unspecified osteoarthritis, unspecified site: M19.90

## 2014-06-25 LAB — BASIC METABOLIC PANEL
ANION GAP: 15 (ref 5–15)
BUN: 23 mg/dL (ref 6–23)
CHLORIDE: 100 meq/L (ref 96–112)
CO2: 24 meq/L (ref 19–32)
CREATININE: 1.14 mg/dL — AB (ref 0.50–1.10)
Calcium: 10 mg/dL (ref 8.4–10.5)
GFR calc Af Amer: 51 mL/min — ABNORMAL LOW (ref >90)
GFR calc non Af Amer: 44 mL/min — ABNORMAL LOW (ref >90)
GLUCOSE: 97 mg/dL (ref 70–99)
Potassium: 3.8 mEq/L (ref 3.7–5.3)
Sodium: 139 mEq/L (ref 137–147)

## 2014-06-25 LAB — URINE MICROSCOPIC-ADD ON

## 2014-06-25 LAB — URINALYSIS, ROUTINE W REFLEX MICROSCOPIC
BILIRUBIN URINE: NEGATIVE
Glucose, UA: NEGATIVE mg/dL
Hgb urine dipstick: NEGATIVE
KETONES UR: NEGATIVE mg/dL
Nitrite: NEGATIVE
PROTEIN: NEGATIVE mg/dL
Specific Gravity, Urine: 1.011 (ref 1.005–1.030)
UROBILINOGEN UA: 0.2 mg/dL (ref 0.0–1.0)
pH: 5.5 (ref 5.0–8.0)

## 2014-06-25 LAB — CBC
HEMATOCRIT: 34.8 % — AB (ref 36.0–46.0)
HEMOGLOBIN: 11.4 g/dL — AB (ref 12.0–15.0)
MCH: 30 pg (ref 26.0–34.0)
MCHC: 32.8 g/dL (ref 30.0–36.0)
MCV: 91.6 fL (ref 78.0–100.0)
Platelets: 268 10*3/uL (ref 150–400)
RBC: 3.8 MIL/uL — ABNORMAL LOW (ref 3.87–5.11)
RDW: 14.5 % (ref 11.5–15.5)
WBC: 4.5 10*3/uL (ref 4.0–10.5)

## 2014-06-25 LAB — PROTIME-INR
INR: 1.32 (ref 0.00–1.49)
Prothrombin Time: 16.6 seconds — ABNORMAL HIGH (ref 11.6–15.2)

## 2014-06-25 LAB — APTT: aPTT: 45 seconds — ABNORMAL HIGH (ref 24–37)

## 2014-06-25 LAB — SURGICAL PCR SCREEN
MRSA, PCR: NEGATIVE
Staphylococcus aureus: NEGATIVE

## 2014-06-25 NOTE — Progress Notes (Signed)
Surgery clearance note Dr. [name] 06/08/14 on chart, Chest x-ray 02/26/14 on [facility], EKG 05/09/14 on [facility]

## 2014-06-25 NOTE — Patient Instructions (Addendum)
[place]  06/25/2014   Your procedure is scheduled on: Monday 07/02/14  Report to [facility] at 12:45 PM.  Call this number if you have problems the morning of surgery [phone]: [phone]   Remember:   Do not eat food After Midnight. Clear liquids from midnight until 08:45am on 07/02/14 then nothing.      Take these medicines the morning of surgery with A SIP OF WATER: metoprolol, omeprazole, alprazolam   Do not wear jewelry, make-up or nail polish.  Do not wear lotions, powders, or perfumes.   Do not shave 48 hours prior to surgery. Men may shave face and neck.  Do not bring valuables to the hospital.  Contacts, dentures or bridgework may not be worn into surgery.  Leave suitcase in the car. After surgery it may be brought to your room.  For patients admitted to the hospital, checkout time is 11:00 AM the day of discharge.     Please read over the following fact sheets that you were given: MRSA Information  [place] - Preparing for Surgery Before surgery, you can play an important role.  Because skin is not sterile, your skin needs to be as free of germs as possible.  You can reduce the number of germs on your skin by washing with CHG (chlorahexidine gluconate) soap before surgery.  CHG is an antiseptic cleaner which kills germs and bonds with the skin to continue killing germs even after washing. Please DO NOT use if you have an allergy to CHG or antibacterial soaps.  If your skin becomes reddened/irritated stop using the CHG and inform your nurse when you arrive at Short Stay. Do not shave (including legs and underarms) for at least 48 hours prior to the first CHG shower.  You may shave your face/neck. Please follow these instructions carefully:  1.  Shower with CHG Soap the night before surgery and the  morning of Surgery.  2.  If you choose to wash your hair, wash your hair first as usual with your  normal  shampoo.  3.  After you shampoo, rinse your hair  and body thoroughly to remove the  shampoo.                            4.  Use CHG as you would any other liquid soap.  You can apply chg directly  to the skin and wash                       Gently with a scrungie or clean washcloth.  5.  Apply the CHG Soap to your body ONLY FROM THE NECK DOWN.   Do not use on face/ open                           Wound or open sores. Avoid contact with eyes, ears mouth and genitals (private parts).                       Wash face,  Genitals (private parts) with your normal soap.             6.  Wash thoroughly, paying special attention to the area where your surgery  will be performed.  7.  Thoroughly rinse your body with warm water from the neck down.  8.  DO NOT shower/wash with your  normal soap after using and rinsing off  the CHG Soap.                9.  Pat yourself dry with a clean towel.            10.  Wear clean pajamas.            11.  Place clean sheets on your bed the night of your first shower and do not  sleep with pets. Day of Surgery : Do not apply any lotions/deodorants the morning of surgery.  Please wear clean clothes to the hospital/surgery center.  FAILURE TO FOLLOW THESE INSTRUCTIONS MAY RESULT IN THE CANCELLATION OF YOUR SURGERY PATIENT SIGNATURE_________________________________  NURSE SIGNATURE__________________________________  ________________________________________________________________________   Jacqueline [name]  An incentive spirometer is a tool that can help keep your lungs clear and active. This tool measures how well you are filling your lungs with each breath. Taking long deep breaths may help reverse or decrease the chance of developing breathing (pulmonary) problems (especially infection) following:  A long period of time when you are unable to move or be active. BEFORE THE PROCEDURE   If the spirometer includes an indicator to show your best effort, your nurse or respiratory therapist will set it to a desired  goal.  If possible, sit up straight or lean slightly forward. Try not to slouch.  Hold the incentive spirometer in an upright position. INSTRUCTIONS FOR USE   Sit on the edge of your bed if possible, or sit up as far as you can in bed or on a chair.  Hold the incentive spirometer in an upright position.  Breathe out normally.  Place the mouthpiece in your mouth and seal your lips tightly around it.  Breathe in slowly and as deeply as possible, raising the piston or the ball toward the top of the column.  Hold your breath for 3-5 seconds or for as long as possible. Allow the piston or ball to fall to the bottom of the column.  Remove the mouthpiece from your mouth and breathe out normally.  Rest for a few seconds and repeat Steps 1 through 7 at least 10 times every 1-2 hours when you are awake. Take your time and take a few normal breaths between deep breaths.  The spirometer may include an indicator to show your best effort. Use the indicator as a goal to work toward during each repetition.  After each set of 10 deep breaths, practice coughing to be sure your lungs are clear. If you have an incision (the cut made at the time of surgery), support your incision when coughing by placing a pillow or rolled up towels firmly against it. Once you are able to get out of bed, walk around indoors and cough well. You may stop using the incentive spirometer when instructed by your caregiver.  RISKS AND COMPLICATIONS  Take your time so you do not get dizzy or light-headed.  If you are in pain, you may need to take or ask for pain medication before doing incentive spirometry. It is harder to take a deep breath if you are having pain. AFTER USE  Rest and breathe slowly and easily.  It can be helpful to keep track of a log of your progress. Your caregiver can provide you with a simple table to help with this. If you are using the spirometer at home, follow these instructions: [place]  IF:   You are having difficultly using the spirometer.  You have trouble using the spirometer as often as instructed.  Your pain medication is not giving enough relief while using the spirometer.  You develop fever of 100.5 F (38.1 C) or higher. SEEK IMMEDIATE MEDICAL CARE IF:   You cough up bloody sputum that had not been present before.  You develop fever of 102 F (38.9 C) or greater.  You develop worsening pain at or near the incision site. MAKE SURE YOU:   Understand these instructions.  Will watch your condition.  Will get help right away if you are not doing well or get worse. Document Released: 11/23/2006 Document Revised: 10/05/2011 Document Reviewed: 01/24/2007 ExitCare Patient Information 2014 ExitCare, [place].   ________________________________________________________________________  WHAT IS A BLOOD TRANSFUSION? Blood Transfusion Information  A transfusion is the replacement of blood or some of its parts. Blood is made up of multiple cells which provide different functions.  Red blood cells carry oxygen and are used for blood loss replacement.  White blood cells fight against infection.  Platelets control bleeding.  Plasma helps clot blood.  Other blood products are available for specialized needs, such as hemophilia or other clotting disorders. BEFORE THE TRANSFUSION  Who gives blood for transfusions?   Healthy volunteers who are fully evaluated to make sure their blood is safe. This is blood bank blood. Transfusion therapy is the safest it has ever been in the practice of medicine. Before blood is taken from a donor, a complete history is taken to make sure that person has no history of diseases nor engages in risky social behavior (examples are intravenous drug use or sexual activity with multiple partners). The donor's travel history is screened to minimize risk of transmitting infections, such as malaria. The donated blood is tested for signs of  infectious diseases, such as HIV and hepatitis. The blood is then tested to be sure it is compatible with you in order to minimize the chance of a transfusion reaction. If you or a relative donates blood, this is often done in anticipation of surgery and is not appropriate for emergency situations. It takes many days to process the donated blood. RISKS AND COMPLICATIONS Although transfusion therapy is very safe and saves many lives, the main dangers of transfusion include:   Getting an infectious disease.  Developing a transfusion reaction. This is an allergic reaction to something in the blood you were given. Every precaution is taken to prevent this. The decision to have a blood transfusion has been considered carefully by your caregiver before blood is given. Blood is not given unless the benefits outweigh the risks. AFTER THE TRANSFUSION  Right after receiving a blood transfusion, you will usually feel much better and more energetic. This is especially true if your red blood cells have gotten low (anemic). The transfusion raises the level of the red blood cells which carry oxygen, and this usually causes an energy increase.  The nurse administering the transfusion will monitor you carefully for complications. HOME CARE INSTRUCTIONS  No special instructions are needed after a transfusion. You may find your energy is better. Speak with your caregiver about any limitations on activity for underlying diseases you may have. SEEK MEDICAL CARE IF:   Your condition is not improving after your transfusion.  You develop redness or irritation at the intravenous (IV) site. SEEK IMMEDIATE MEDICAL CARE IF:  Any of the following symptoms occur over the next 12 hours:  Shaking chills.  You have a temperature by mouth above 102 F (38.9 C), not controlled  by medicine.  Chest, back, or muscle pain.  People around you feel you are not acting correctly or are confused.  Shortness of breath or  difficulty breathing.  Dizziness and fainting.  You get a rash or develop hives.  You have a decrease in urine output.  Your urine turns a dark color or changes to pink, red, or brown. Any of the following symptoms occur over the next 10 days:  You have a temperature by mouth above 102 F (38.9 C), not controlled by medicine.  Shortness of breath.  Weakness after normal activity.  The white part of the eye turns yellow (jaundice).  You have a decrease in the amount of urine or are urinating less often.  Your urine turns a dark color or changes to pink, red, or brown. Document Released: 07/10/2000 Document Revised: 10/05/2011 Document Reviewed: 02/27/2008 ExitCare Patient Information 2014 ExitCare, [place].  _______________________________________________________________________   CLEAR LIQUID DIET   Foods Allowed                                                                     Foods Excluded  Coffee and tea, regular and decaf                             liquids that you cannot  Plain Jell-O in any flavor                                             see through such as: Fruit ices (not with fruit pulp)                                     milk, soups, orange juice  Iced Popsicles                                    All solid food Carbonated beverages, regular and diet                                    Cranberry, grape and apple juices Sports drinks like Gatorade Lightly seasoned clear broth or consume(fat free) Sugar, honey syrup  Sample Menu Breakfast                                Lunch                                     Supper Cranberry juice                    Beef broth                            Chicken broth Jell-O  Grape juice                           Apple juice Coffee or tea                        Jell-O                                      Popsicle                                                Coffee or tea                         Coffee or tea  _____________________________________________________________________

## 2014-06-25 NOTE — Progress Notes (Signed)
06-25-14 1650 Labs viewable in [place], note coags(uses Eliquis) and urinalysis. Will repeat PT/INR AM of.

## 2014-06-26 LAB — ABO/RH: ABO/RH(D): O NEG

## 2014-06-30 NOTE — Anesthesia Preprocedure Evaluation (Addendum)
Anesthesia Evaluation  Patient identified by MRN, date of birth, ID band Patient awake    Reviewed: Allergy & Precautions, H&P , NPO status , Patient's Chart, lab work & pertinent test results, reviewed documented beta blocker date and time   History of Anesthesia Complications Negative for: history of anesthetic complications  Airway Mallampati: II  TM Distance: >3 FB Neck ROM: Full    Dental no notable dental hx. (+) Dental Advisory Given, Edentulous Upper, Edentulous Lower   Pulmonary neg pulmonary ROS,  breath sounds clear to auscultation  Pulmonary exam normal       Cardiovascular Exercise Tolerance: Good hypertension, Pt. on medications and Pt. on home beta blockers + CAD and + CABG + dysrhythmias Atrial Fibrillation Rhythm:Regular Rate:Normal     Neuro/Psych negative neurological ROS  negative psych ROS   GI/Hepatic Neg liver ROS, GERD-  Medicated and Controlled,  Endo/Other  negative endocrine ROS  Renal/GU CRFRenal disease  negative genitourinary   Musculoskeletal  (+) Arthritis -, Osteoarthritis,    Abdominal   Peds negative pediatric ROS (+)  Hematology negative hematology ROS (+)   Anesthesia Other Findings   Reproductive/Obstetrics negative OB ROS                            Anesthesia Physical Anesthesia Plan  ASA: III  Anesthesia Plan: General   Post-op Pain Management:    Induction: Intravenous  Airway Management Planned: LMA  Additional Equipment:   Intra-op Plan:   Post-operative Plan: Extubation in OR  Informed Consent: I have reviewed the patients History and Physical, chart, labs and discussed the procedure including the risks, benefits and alternatives for the proposed anesthesia with the patient or authorized representative who has indicated his/her understanding and acceptance.   Dental advisory given  Plan Discussed with: CRNA  Anesthesia Plan  Comments:         Anesthesia Quick Evaluation

## 2014-07-01 NOTE — H&P (Signed)
Jacqueline [name] is an 80 y.o. female.    Procedure:    Right total knee resection and placement of antibiotic spacer   Chief Complaint:    Right knee infected total knee arthroplasty  HPI: Pt is a 80 y.o. female complaining of right knee pain for 2+ months. Pain had continually increased since the beginning. X-rays in the clinic show previous TKA of the right knee. Pt knee has been aspirated and lab work was obtained. Patient labs results indicate an infected right TKA.  SED rate = 67 (0-22), CRP = 3.0 (<0.60) and bone scan revealed loosening / infection.  The original replacement was preformed by Dr. Veverly Fells in 2006, he is the one that has consulted Dr. Alvan Dame.  Various options are discussed with the patient. Risks, benefits and expectations were discussed with the patient. Patient understand the risks, benefits and expectations and wishes to proceed with surgery.    PCP: Zigmund Gottron, MD  D/C Plans:      SNF - [facility]  Post-op Meds:       No Rx given  Tranexamic Acid:      To be given -  Topically (CAD)  Decadron:      Is to be given  FYI:     Eliquis post-op  Norco post-op   PMH: Past Medical History  Diagnosis Date  . Hypertension   . CAD (coronary artery disease)   . History of stress test     Normal perfusion study which shows normal perfusion throughout and an EF 77%  . Hx of echocardiogram     Showed mild left ventricular hypertrophy, perserved left ventricular systolic function, evidence of diastolic dysfunction, and a severly dilated left atrium, but minimal valvular abnormalities.  . Hyperlipemia   . Insomnia   . Diverticulitis 02/21/2012  . GERD (gastroesophageal reflux disease)   . Atrial fibrillation with rapid ventricular response 02/2014  . Arthritis     back    PSH: Past Surgical History  Procedure Laterality Date  . Coronary artery bypass graft  2006    off-pump bypass surgery with LIMA to the LAD and SVG to the second diagonal artery (  performed by Dr Servando Snare)   . Tee without cardioversion N/A 02/28/2014    Procedure: TRANSESOPHAGEAL ECHOCARDIOGRAM (TEE);  Surgeon: Sanda Klein, MD;  Location: [facility] ENDOSCOPY;  Service: Cardiovascular;  Laterality: N/A;  . Cardioversion N/A 02/28/2014    Procedure: CARDIOVERSION;  Surgeon: Sanda Klein, MD;  Location: [facility] ENDOSCOPY;  Service: Cardiovascular;  Laterality: N/A;  . Cataract extraction Bilateral 2014  . Abdominal hysterectomy  1975  . Rotator cuff repair Bilateral     r-1999, l- 2005  . Joint replacement Bilateral     L-2004, R-2006    Social History:  reports that she has never smoked. She has never used smokeless tobacco. She reports that she does not drink alcohol or use illicit drugs.  Allergies:  Allergies  Allergen Reactions  . Atorvastatin Other (See Comments)    Myalgias in legs  . Crestor [Rosuvastatin] Other (See Comments)    Myalgias in legs  . Morphine And Related Other (See Comments)    "Crazy thoughts, severe headache, sick"    Medications: No current facility-administered medications for this encounter.   Current Outpatient Prescriptions  Medication Sig Dispense Refill  . acetaminophen (TYLENOL ARTHRITIS PAIN) 650 MG CR tablet Take 650-1,300 mg by mouth 2 (two) times daily as needed for pain.    Marland Kitchen alendronate (FOSAMAX) 70 MG tablet  Take 1 tablet (70 mg total) by mouth once a week. Take with a full glass of water on an empty stomach, remain upright (Patient taking differently: Take 70 mg by mouth once a week. Saturday) 12 tablet 3  . ALPRAZolam (XANAX) 0.5 MG tablet Take 1 tablet (0.5 mg total) by mouth 2 (two) times daily. 180 tablet 3  . apixaban (ELIQUIS) 5 MG TABS tablet Take 1 tablet (5 mg total) by mouth 2 (two) times daily. 180 tablet 3  . Cholecalciferol (VITAMIN D-3) 1000 UNITS CAPS Take 2,000 Units by mouth 2 (two) times daily.    . diphenhydrAMINE (BENADRYL) 25 MG tablet Take 75 mg by mouth 2 (two) times daily as needed for allergies (eye  allergies).     . enalapril (VASOTEC) 10 MG tablet Take 10 mg by mouth 2 (two) times daily.    . hydrochlorothiazide (MICROZIDE) 12.5 MG capsule Take 12.5 mg by mouth every morning.    Marland Kitchen HYDROcodone-acetaminophen (NORCO/VICODIN) 5-325 MG per tablet Take 1-2 tablets by mouth every 4 (four) hours as needed for moderate pain.    . Iron-Vitamins (GERITOL COMPLETE PO) Take 1 tablet by mouth every morning.    . metoprolol tartrate (LOPRESSOR) 25 MG tablet Take 1 tablet (25 mg total) by mouth 2 (two) times daily. 180 tablet 3  . nitroGLYCERIN (NITROSTAT) 0.4 MG SL tablet Place 1 tablet (0.4 mg total) under the tongue every 5 (five) minutes as needed for chest pain. 25 tablet 2  . Omega-3 Fatty Acids (FISH OIL) 1000 MG CAPS Take 3 capsules by mouth daily. Per Cardiologist    . omeprazole (PRILOSEC) 20 MG capsule Take 1 capsule (20 mg total) by mouth daily. (Patient taking differently: Take 20 mg by mouth every morning. ) 90 capsule 3  . polyethylene glycol (MIRALAX / GLYCOLAX) packet Take 17 g by mouth daily as needed for moderate constipation (couple times weekly, depending).    . Polyvinyl Alcohol-Povidone (REFRESH OP) Apply 1 drop to eye 3 (three) times daily as needed (dry eyes).    Marland Kitchen rOPINIRole (REQUIP) 0.25 MG tablet Take 1 tablet (0.25 mg total) by mouth 3 (three) times daily. 270 tablet 3  . simvastatin (ZOCOR) 40 MG tablet Take 1 tablet (40 mg total) by mouth daily at 6 PM. (Patient taking differently: Take 40 mg by mouth at bedtime. ) 90 tablet 3  . traMADol (ULTRAM) 50 MG tablet Take 1 tablet (50 mg total) by mouth 2 (two) times daily. (Patient taking differently: Take 50 mg by mouth at bedtime. ) 60 tablet 5  . traZODone (DESYREL) 50 MG tablet Take 1 tablet (50 mg total) by mouth at bedtime. 90 tablet 3  . vitamin B-12 (CYANOCOBALAMIN) 100 MCG tablet Take 2,000 mcg by mouth 2 (two) times daily.    . hydrochlorothiazide (HYDRODIURIL) 25 MG tablet Take 0.5 tablets (12.5 mg total) by mouth daily.  (Patient not taking: Reported on 06/15/2014) 90 tablet 3     Review of Systems  Constitutional: Negative.   HENT: Negative.   Eyes: Negative.   Respiratory: Negative.   Cardiovascular: Negative.   Gastrointestinal: Positive for heartburn.  Genitourinary: Negative.   Musculoskeletal: Positive for joint pain.  Skin: Negative.   Neurological: Negative.   Endo/Heme/Allergies: Negative.   Psychiatric/Behavioral: The patient has insomnia.         Physical Exam  Constitutional: She is oriented to person, place, and time. She appears well-developed and well-nourished.  HENT:  Head: Normocephalic and atraumatic.  Eyes: Pupils are equal,  round, and reactive to light.  Neck: Neck supple. No JVD present. No tracheal deviation present. No thyromegaly present.  Cardiovascular: Normal rate, regular rhythm, normal heart sounds and intact distal pulses.   Respiratory: Effort normal and breath sounds normal. No stridor. No respiratory distress. She has no wheezes.  GI: Soft. There is no tenderness. There is no guarding.  Musculoskeletal:       Right knee: She exhibits decreased range of motion, swelling, laceration (previous incision healed well) and bony tenderness. She exhibits no ecchymosis, no deformity and no erythema. Tenderness found.  Lymphadenopathy:    She has no cervical adenopathy.  Neurological: She is alert and oriented to person, place, and time.  Skin: Skin is warm and dry.  Psychiatric: She has a normal mood and affect.        Assessment/Plan Assessment: Right knee infected total knee arthroplasty  Plan: Patient will undergo a right total knee resection and placement of antibiotic spacer on 07/02/2014 per Dr. Alvan Dame at [facility]. Risks benefits and expectations were discussed with the patient. Patient understand risks, benefits and expectations and wishes to proceed.   [place] Pugh Analiese Krupka   PA-C  07/01/2014, 7:18 PM

## 2014-07-02 ENCOUNTER — Inpatient Hospital Stay (HOSPITAL_COMMUNITY): Payer: Medicare Other | Admitting: Anesthesiology

## 2014-07-02 ENCOUNTER — Encounter (HOSPITAL_COMMUNITY)
Admission: RE | Disposition: A | Discharge status: Skilled Nursing Facility | Payer: Self-pay | Source: Ambulatory Visit | Attending: Orthopedic Surgery

## 2014-07-02 ENCOUNTER — Encounter (HOSPITAL_COMMUNITY): Payer: Self-pay | Admitting: *Deleted

## 2014-07-02 ENCOUNTER — Inpatient Hospital Stay (HOSPITAL_COMMUNITY)
Admission: RE | Admit: 2014-07-02 | Discharge: 2014-07-05 | DRG: 467 | Disposition: A | Discharge status: Skilled Nursing Facility | Payer: Medicare Other | Source: Ambulatory Visit | Attending: Orthopedic Surgery | Admitting: Orthopedic Surgery

## 2014-07-02 DIAGNOSIS — I4891 Unspecified atrial fibrillation: Secondary | ICD-10-CM | POA: Diagnosis present

## 2014-07-02 DIAGNOSIS — I2510 Atherosclerotic heart disease of native coronary artery without angina pectoris: Secondary | ICD-10-CM | POA: Diagnosis present

## 2014-07-02 DIAGNOSIS — D62 Acute posthemorrhagic anemia: Secondary | ICD-10-CM | POA: Diagnosis not present

## 2014-07-02 DIAGNOSIS — K219 Gastro-esophageal reflux disease without esophagitis: Secondary | ICD-10-CM | POA: Diagnosis present

## 2014-07-02 DIAGNOSIS — Z9841 Cataract extraction status, right eye: Secondary | ICD-10-CM | POA: Diagnosis not present

## 2014-07-02 DIAGNOSIS — Z9842 Cataract extraction status, left eye: Secondary | ICD-10-CM | POA: Diagnosis not present

## 2014-07-02 DIAGNOSIS — R197 Diarrhea, unspecified: Secondary | ICD-10-CM | POA: Diagnosis present

## 2014-07-02 DIAGNOSIS — I10 Essential (primary) hypertension: Secondary | ICD-10-CM | POA: Diagnosis present

## 2014-07-02 DIAGNOSIS — N179 Acute kidney failure, unspecified: Secondary | ICD-10-CM | POA: Diagnosis not present

## 2014-07-02 DIAGNOSIS — Z886 Allergy status to analgesic agent status: Secondary | ICD-10-CM | POA: Diagnosis not present

## 2014-07-02 DIAGNOSIS — E785 Hyperlipidemia, unspecified: Secondary | ICD-10-CM | POA: Diagnosis present

## 2014-07-02 DIAGNOSIS — Z888 Allergy status to other drugs, medicaments and biological substances status: Secondary | ICD-10-CM

## 2014-07-02 DIAGNOSIS — Y831 Surgical operation with implant of artificial internal device as the cause of abnormal reaction of the patient, or of later complication, without mention of misadventure at the time of the procedure: Secondary | ICD-10-CM | POA: Diagnosis present

## 2014-07-02 DIAGNOSIS — M1990 Unspecified osteoarthritis, unspecified site: Secondary | ICD-10-CM | POA: Diagnosis present

## 2014-07-02 DIAGNOSIS — T8453XA Infection and inflammatory reaction due to internal right knee prosthesis, initial encounter: Principal | ICD-10-CM | POA: Diagnosis present

## 2014-07-02 DIAGNOSIS — Z951 Presence of aortocoronary bypass graft: Secondary | ICD-10-CM

## 2014-07-02 DIAGNOSIS — Z89529 Acquired absence of unspecified knee: Secondary | ICD-10-CM

## 2014-07-02 DIAGNOSIS — R112 Nausea with vomiting, unspecified: Secondary | ICD-10-CM | POA: Diagnosis present

## 2014-07-02 HISTORY — PX: TOTAL KNEE ARTHROPLASTY: SHX125

## 2014-07-02 LAB — PROTIME-INR
INR: 1.01 (ref 0.00–1.49)
Prothrombin Time: 13.4 seconds (ref 11.6–15.2)

## 2014-07-02 LAB — CREATININE, SERUM
Creatinine, Ser: 1.04 mg/dL (ref 0.50–1.10)
GFR calc non Af Amer: 49 mL/min — ABNORMAL LOW (ref >90)
GFR, EST AFRICAN AMERICAN: 57 mL/min — AB (ref >90)

## 2014-07-02 SURGERY — ARTHROPLASTY, KNEE, TOTAL
Anesthesia: General | Site: Knee | Laterality: Right

## 2014-07-02 MED ORDER — TRANEXAMIC ACID 100 MG/ML IV SOLN
2000.0000 mg | Freq: Once | INTRAVENOUS | Status: DC
  Filled 2014-07-02: qty 20

## 2014-07-02 MED ORDER — VANCOMYCIN HCL IN DEXTROSE 1-5 GM/200ML-% IV SOLN
INTRAVENOUS | Status: AC
  Filled 2014-07-02: qty 200

## 2014-07-02 MED ORDER — LACTATED RINGERS IV SOLN
INTRAVENOUS | Status: DC
  Administered 2014-07-02: 1000 mL via INTRAVENOUS
  Administered 2014-07-02: 18:00:00 via INTRAVENOUS

## 2014-07-02 MED ORDER — DIPHENHYDRAMINE HCL 25 MG PO CAPS: 25.0000 mg | ORAL_CAPSULE | Freq: Four times a day (QID) | ORAL | Status: DC | PRN

## 2014-07-02 MED ORDER — ONDANSETRON HCL 4 MG/2ML IJ SOLN
INTRAMUSCULAR | Status: AC
  Filled 2014-07-02: qty 2

## 2014-07-02 MED ORDER — MAGNESIUM CITRATE PO SOLN: 1.0000 | Freq: Once | ORAL | Status: AC | PRN

## 2014-07-02 MED ORDER — ALPRAZOLAM 0.5 MG PO TABS
0.5000 mg | ORAL_TABLET | Freq: Two times a day (BID) | ORAL | Status: DC
  Administered 2014-07-02 – 2014-07-05 (×6): 0.5 mg via ORAL
  Filled 2014-07-02 (×6): qty 1

## 2014-07-02 MED ORDER — NITROGLYCERIN 0.4 MG SL SUBL: 0.4000 mg | SUBLINGUAL_TABLET | SUBLINGUAL | Status: DC | PRN

## 2014-07-02 MED ORDER — SODIUM CHLORIDE 0.9 % IJ SOLN
INTRAMUSCULAR | Status: AC
  Filled 2014-07-02: qty 50

## 2014-07-02 MED ORDER — PANTOPRAZOLE SODIUM 40 MG PO TBEC
40.0000 mg | DELAYED_RELEASE_TABLET | Freq: Every day | ORAL | Status: DC
  Administered 2014-07-03 – 2014-07-05 (×3): 40 mg via ORAL
  Filled 2014-07-02 (×3): qty 1

## 2014-07-02 MED ORDER — MENTHOL 3 MG MT LOZG: 1.0000 | LOZENGE | OROMUCOSAL | Status: DC | PRN

## 2014-07-02 MED ORDER — VANCOMYCIN HCL IN DEXTROSE 1-5 GM/200ML-% IV SOLN
1000.0000 mg | INTRAVENOUS | Status: AC
  Administered 2014-07-02: 1000 mg via INTRAVENOUS

## 2014-07-02 MED ORDER — FENTANYL CITRATE 0.05 MG/ML IJ SOLN
INTRAMUSCULAR | Status: DC | PRN
  Administered 2014-07-02 (×5): 25 ug via INTRAVENOUS
  Administered 2014-07-02: 50 ug via INTRAVENOUS
  Administered 2014-07-02 (×8): 25 ug via INTRAVENOUS
  Administered 2014-07-02: 50 ug via INTRAVENOUS
  Administered 2014-07-02: 25 ug via INTRAVENOUS

## 2014-07-02 MED ORDER — DEXAMETHASONE SODIUM PHOSPHATE 10 MG/ML IJ SOLN
10.0000 mg | Freq: Once | INTRAMUSCULAR | Status: AC
  Administered 2014-07-03: 10 mg via INTRAVENOUS
  Filled 2014-07-02: qty 1

## 2014-07-02 MED ORDER — PROPOFOL 10 MG/ML IV BOLUS
INTRAVENOUS | Status: AC
  Filled 2014-07-02: qty 20

## 2014-07-02 MED ORDER — TRAZODONE HCL 50 MG PO TABS
50.0000 mg | ORAL_TABLET | Freq: Every day | ORAL | Status: DC
  Administered 2014-07-02 – 2014-07-05 (×3): 50 mg via ORAL
  Filled 2014-07-02 (×6): qty 1

## 2014-07-02 MED ORDER — DEXAMETHASONE SODIUM PHOSPHATE 10 MG/ML IJ SOLN
10.0000 mg | Freq: Once | INTRAMUSCULAR | Status: AC
  Administered 2014-07-02: 10 mg via INTRAVENOUS

## 2014-07-02 MED ORDER — DOCUSATE SODIUM 100 MG PO CAPS
100.0000 mg | ORAL_CAPSULE | Freq: Two times a day (BID) | ORAL | Status: DC
  Administered 2014-07-02 – 2014-07-03 (×3): 100 mg via ORAL
  Filled 2014-07-02: qty 1

## 2014-07-02 MED ORDER — FERROUS SULFATE 325 (65 FE) MG PO TABS
325.0000 mg | ORAL_TABLET | Freq: Three times a day (TID) | ORAL | Status: DC
  Administered 2014-07-03 – 2014-07-04 (×4): 325 mg via ORAL
  Filled 2014-07-02 (×11): qty 1

## 2014-07-02 MED ORDER — HYDROMORPHONE HCL 1 MG/ML IJ SOLN
0.5000 mg | INTRAMUSCULAR | Status: DC | PRN
  Administered 2014-07-02 – 2014-07-03 (×3): 1 mg via INTRAVENOUS
  Filled 2014-07-02 (×3): qty 1

## 2014-07-02 MED ORDER — BUPIVACAINE-EPINEPHRINE (PF) 0.25% -1:200000 IJ SOLN
INTRAMUSCULAR | Status: AC
  Filled 2014-07-02: qty 30

## 2014-07-02 MED ORDER — SODIUM CHLORIDE 0.9 % IV SOLN
INTRAVENOUS | Status: DC
  Administered 2014-07-03 – 2014-07-04 (×2): via INTRAVENOUS
  Filled 2014-07-02 (×9): qty 1000

## 2014-07-02 MED ORDER — CEFAZOLIN SODIUM-DEXTROSE 2-3 GM-% IV SOLR
2.0000 g | INTRAVENOUS | Status: AC
  Administered 2014-07-02: 2 g via INTRAVENOUS

## 2014-07-02 MED ORDER — FENTANYL CITRATE 0.05 MG/ML IJ SOLN
INTRAMUSCULAR | Status: AC
  Filled 2014-07-02: qty 2

## 2014-07-02 MED ORDER — METHOCARBAMOL 1000 MG/10ML IJ SOLN
500.0000 mg | Freq: Four times a day (QID) | INTRAMUSCULAR | Status: DC | PRN
  Administered 2014-07-02: 500 mg via INTRAVENOUS
  Filled 2014-07-02 (×2): qty 5

## 2014-07-02 MED ORDER — VANCOMYCIN HCL 1000 MG IV SOLR
INTRAVENOUS | Status: AC
  Filled 2014-07-02: qty 4000

## 2014-07-02 MED ORDER — VANCOMYCIN HCL IN DEXTROSE 1-5 GM/200ML-% IV SOLN
1000.0000 mg | INTRAVENOUS | Status: DC
  Administered 2014-07-03 – 2014-07-04 (×2): 1000 mg via INTRAVENOUS
  Filled 2014-07-02 (×3): qty 200

## 2014-07-02 MED ORDER — SIMVASTATIN 40 MG PO TABS
40.0000 mg | ORAL_TABLET | Freq: Every day | ORAL | Status: DC
  Administered 2014-07-02 – 2014-07-05 (×3): 40 mg via ORAL
  Filled 2014-07-02 (×5): qty 1

## 2014-07-02 MED ORDER — METHOCARBAMOL 500 MG PO TABS
500.0000 mg | ORAL_TABLET | Freq: Four times a day (QID) | ORAL | Status: DC | PRN
  Administered 2014-07-03 – 2014-07-04 (×3): 500 mg via ORAL
  Filled 2014-07-02 (×3): qty 1

## 2014-07-02 MED ORDER — KETOROLAC TROMETHAMINE 30 MG/ML IJ SOLN
INTRAMUSCULAR | Status: DC | PRN
  Administered 2014-07-02: 30 mg via INTRAVENOUS

## 2014-07-02 MED ORDER — HYDROCODONE-ACETAMINOPHEN 7.5-325 MG PO TABS
1.0000 | ORAL_TABLET | ORAL | Status: DC
  Administered 2014-07-02 – 2014-07-03 (×2): 2 via ORAL
  Administered 2014-07-03: 1 via ORAL
  Administered 2014-07-03 (×2): 2 via ORAL
  Filled 2014-07-02 (×2): qty 2
  Filled 2014-07-02: qty 1
  Filled 2014-07-02 (×2): qty 2

## 2014-07-02 MED ORDER — ONDANSETRON HCL 4 MG/2ML IJ SOLN
INTRAMUSCULAR | Status: DC | PRN
  Administered 2014-07-02: 4 mg via INTRAVENOUS

## 2014-07-02 MED ORDER — SODIUM CHLORIDE 0.9 % IV SOLN
2000.0000 mg | INTRAVENOUS | Status: DC | PRN
  Administered 2014-07-02: 2000 mg via INTRAVENOUS

## 2014-07-02 MED ORDER — METOCLOPRAMIDE HCL 5 MG/ML IJ SOLN: 5.0000 mg | Freq: Three times a day (TID) | INTRAMUSCULAR | Status: DC | PRN

## 2014-07-02 MED ORDER — PHENOL 1.4 % MT LIQD: 1.0000 | OROMUCOSAL | Status: DC | PRN

## 2014-07-02 MED ORDER — HYDROCHLOROTHIAZIDE 12.5 MG PO CAPS
12.5000 mg | ORAL_CAPSULE | Freq: Every morning | ORAL | Status: DC
  Administered 2014-07-04 – 2014-07-05 (×2): 12.5 mg via ORAL
  Filled 2014-07-02 (×3): qty 1

## 2014-07-02 MED ORDER — POLYETHYLENE GLYCOL 3350 17 G PO PACK
17.0000 g | PACK | Freq: Two times a day (BID) | ORAL | Status: DC
  Administered 2014-07-03 (×2): 17 g via ORAL
  Filled 2014-07-02: qty 1

## 2014-07-02 MED ORDER — FENTANYL CITRATE 0.05 MG/ML IJ SOLN
25.0000 ug | INTRAMUSCULAR | Status: DC | PRN
  Administered 2014-07-02 (×4): 50 ug via INTRAVENOUS

## 2014-07-02 MED ORDER — PROPOFOL 10 MG/ML IV BOLUS
INTRAVENOUS | Status: DC | PRN
Start: 2014-07-02 — End: 2014-07-02
  Administered 2014-07-02: 40 mg via INTRAVENOUS
  Administered 2014-07-02: 100 mg via INTRAVENOUS

## 2014-07-02 MED ORDER — TOBRAMYCIN SULFATE 1.2 G IJ SOLR
INTRAMUSCULAR | Status: AC
  Filled 2014-07-02: qty 3.6

## 2014-07-02 MED ORDER — CELECOXIB 200 MG PO CAPS
200.0000 mg | ORAL_CAPSULE | Freq: Two times a day (BID) | ORAL | Status: DC
  Administered 2014-07-02 – 2014-07-03 (×3): 200 mg via ORAL
  Filled 2014-07-02 (×5): qty 1

## 2014-07-02 MED ORDER — ACETAMINOPHEN 10 MG/ML IV SOLN
1000.0000 mg | Freq: Once | INTRAVENOUS | Status: AC
  Administered 2014-07-02: 1000 mg via INTRAVENOUS
  Filled 2014-07-02: qty 100

## 2014-07-02 MED ORDER — METOCLOPRAMIDE HCL 10 MG PO TABS: 5.0000 mg | ORAL_TABLET | Freq: Three times a day (TID) | ORAL | Status: DC | PRN

## 2014-07-02 MED ORDER — BISACODYL 10 MG RE SUPP: 10.0000 mg | Freq: Every day | RECTAL | Status: DC | PRN

## 2014-07-02 MED ORDER — BUPIVACAINE-EPINEPHRINE (PF) 0.25% -1:200000 IJ SOLN
INTRAMUSCULAR | Status: DC | PRN
  Administered 2014-07-02: 30 mL

## 2014-07-02 MED ORDER — DEXAMETHASONE SODIUM PHOSPHATE 10 MG/ML IJ SOLN
INTRAMUSCULAR | Status: AC
  Filled 2014-07-02: qty 1

## 2014-07-02 MED ORDER — SODIUM CHLORIDE 0.9 % IJ SOLN
INTRAMUSCULAR | Status: DC | PRN
  Administered 2014-07-02: 10 mL via INTRAVENOUS

## 2014-07-02 MED ORDER — FENTANYL CITRATE 0.05 MG/ML IJ SOLN
INTRAMUSCULAR | Status: AC
  Filled 2014-07-02: qty 5

## 2014-07-02 MED ORDER — ONDANSETRON HCL 4 MG/2ML IJ SOLN
4.0000 mg | Freq: Four times a day (QID) | INTRAMUSCULAR | Status: DC | PRN
  Administered 2014-07-04: 4 mg via INTRAVENOUS
  Filled 2014-07-02: qty 2

## 2014-07-02 MED ORDER — CEFAZOLIN SODIUM-DEXTROSE 2-3 GM-% IV SOLR
INTRAVENOUS | Status: AC
  Filled 2014-07-02: qty 50

## 2014-07-02 MED ORDER — ONDANSETRON HCL 4 MG/2ML IJ SOLN: 4.0000 mg | Freq: Once | INTRAMUSCULAR | Status: DC | PRN

## 2014-07-02 MED ORDER — VANCOMYCIN HCL 1000 MG IV SOLR: 1000.0000 mg | Freq: Once | INTRAVENOUS | Status: DC

## 2014-07-02 MED ORDER — APIXABAN 5 MG PO TABS
5.0000 mg | ORAL_TABLET | Freq: Two times a day (BID) | ORAL | Status: DC
  Administered 2014-07-03 – 2014-07-05 (×5): 5 mg via ORAL
  Filled 2014-07-02 (×8): qty 1

## 2014-07-02 MED ORDER — KETOROLAC TROMETHAMINE 30 MG/ML IJ SOLN
INTRAMUSCULAR | Status: AC
  Filled 2014-07-02: qty 1

## 2014-07-02 MED ORDER — ALUM & MAG HYDROXIDE-SIMETH 200-200-20 MG/5ML PO SUSP
30.0000 mL | ORAL | Status: DC | PRN
  Administered 2014-07-03 – 2014-07-04 (×3): 30 mL via ORAL
  Filled 2014-07-02 (×3): qty 30

## 2014-07-02 MED ORDER — ROPINIROLE HCL 0.25 MG PO TABS
0.2500 mg | ORAL_TABLET | Freq: Three times a day (TID) | ORAL | Status: DC
  Administered 2014-07-02 – 2014-07-05 (×7): 0.25 mg via ORAL
  Filled 2014-07-02 (×11): qty 1

## 2014-07-02 MED ORDER — ONDANSETRON HCL 4 MG PO TABS: 4.0000 mg | ORAL_TABLET | Freq: Four times a day (QID) | ORAL | Status: DC | PRN

## 2014-07-02 MED ORDER — METOPROLOL TARTRATE 25 MG PO TABS
25.0000 mg | ORAL_TABLET | Freq: Two times a day (BID) | ORAL | Status: DC
  Administered 2014-07-02 – 2014-07-05 (×5): 25 mg via ORAL
  Filled 2014-07-02 (×8): qty 1

## 2014-07-02 SURGICAL SUPPLY — 63 items
BAG SPEC THK2 15X12 ZIP CLS (MISCELLANEOUS)
BAG ZIPLOCK 12X15 (MISCELLANEOUS) IMPLANT
BANDAGE ELASTIC 6 VELCRO ST LF (GAUZE/BANDAGES/DRESSINGS) ×3 IMPLANT
BANDAGE ESMARK 6X9 LF (GAUZE/BANDAGES/DRESSINGS) ×1 IMPLANT
BLADE SAW SGTL 13.0X1.19X90.0M (BLADE) ×3 IMPLANT
BNDG CMPR 9X6 STRL LF SNTH (GAUZE/BANDAGES/DRESSINGS) ×1
BNDG ESMARK 6X9 LF (GAUZE/BANDAGES/DRESSINGS) ×3
BOWL SMART MIX CTS (DISPOSABLE) ×3 IMPLANT
BRUSH FEMORAL CANAL (MISCELLANEOUS) ×2 IMPLANT
CEMENT HV SMART SET (Cement) ×6 IMPLANT
CUFF TOURN SGL QUICK 34 (TOURNIQUET CUFF) ×3
CUFF TRNQT CYL 34X4X40X1 (TOURNIQUET CUFF) ×1 IMPLANT
DECANTER SPIKE VIAL GLASS SM (MISCELLANEOUS) ×3 IMPLANT
DRAPE EXTREMITY T 121X128X90 (DRAPE) ×3 IMPLANT
DRAPE POUCH INSTRU U-SHP 10X18 (DRAPES) ×3 IMPLANT
DRAPE U-SHAPE 47X51 STRL (DRAPES) ×3 IMPLANT
DRSG AQUACEL AG ADV 3.5X10 (GAUZE/BANDAGES/DRESSINGS) ×1 IMPLANT
DRSG TEGADERM 4X4.75 (GAUZE/BANDAGES/DRESSINGS) ×2 IMPLANT
DURAPREP 26ML APPLICATOR (WOUND CARE) ×6 IMPLANT
ELECT REM PT RETURN 9FT ADLT (ELECTROSURGICAL) ×3
ELECTRODE REM PT RTRN 9FT ADLT (ELECTROSURGICAL) ×1 IMPLANT
EVACUATOR 3/16  PVC DRAIN (DRAIN) ×2
EVACUATOR 3/16 PVC DRAIN (DRAIN) IMPLANT
FACESHIELD WRAPAROUND (MASK) ×15 IMPLANT
FACESHIELD WRAPAROUND OR TEAM (MASK) ×5 IMPLANT
GAUZE SPONGE 4X4 12PLY STRL (GAUZE/BANDAGES/DRESSINGS) ×2 IMPLANT
GAUZE XEROFORM 5X9 LF (GAUZE/BANDAGES/DRESSINGS) ×2 IMPLANT
GLOVE BIOGEL PI IND STRL 7.5 (GLOVE) ×1 IMPLANT
GLOVE BIOGEL PI IND STRL 8.5 (GLOVE) ×1 IMPLANT
GLOVE BIOGEL PI INDICATOR 7.5 (GLOVE) ×2
GLOVE BIOGEL PI INDICATOR 8.5 (GLOVE) ×2
GLOVE ECLIPSE 8.0 STRL XLNG CF (GLOVE) ×3 IMPLANT
GLOVE ORTHO TXT STRL SZ7.5 (GLOVE) ×6 IMPLANT
GOWN SPEC L3 XXLG W/TWL (GOWN DISPOSABLE) ×3 IMPLANT
GOWN STRL REUS W/TWL LRG LVL3 (GOWN DISPOSABLE) ×3 IMPLANT
HANDPIECE INTERPULSE COAX TIP (DISPOSABLE) ×3
IMMOBILIZER KNEE 20 (SOFTGOODS) ×2 IMPLANT
KIT BASIN OR (CUSTOM PROCEDURE TRAY) ×3 IMPLANT
LIQUID BAND (GAUZE/BANDAGES/DRESSINGS) ×1 IMPLANT
MANIFOLD NEPTUNE II (INSTRUMENTS) ×3 IMPLANT
NDL SAFETY ECLIPSE 18X1.5 (NEEDLE) ×1 IMPLANT
NEEDLE HYPO 18GX1.5 SHARP (NEEDLE) ×3
PACK TOTAL JOINT (CUSTOM PROCEDURE TRAY) ×3 IMPLANT
PAD ABD 8X10 STRL (GAUZE/BANDAGES/DRESSINGS) ×2 IMPLANT
PADDING CAST ABS 6INX4YD NS (CAST SUPPLIES) ×2
PADDING CAST ABS COTTON 6X4 NS (CAST SUPPLIES) IMPLANT
POSITIONER SURGICAL ARM (MISCELLANEOUS) ×3 IMPLANT
SET HNDPC FAN SPRY TIP SCT (DISPOSABLE) ×1 IMPLANT
SET PAD KNEE POSITIONER (MISCELLANEOUS) ×3 IMPLANT
SPACER KASM MOLD44APX67ML KNEE (Spacer) ×2 IMPLANT
SPACER KASM MOLD44APX70ML KNEE (Spacer) ×2 IMPLANT
SUCTION FRAZIER 12FR DISP (SUCTIONS) ×3 IMPLANT
SUT MNCRL AB 4-0 PS2 18 (SUTURE) ×3 IMPLANT
SUT VIC AB 1 CT1 36 (SUTURE) ×3 IMPLANT
SUT VIC AB 2-0 CT1 27 (SUTURE) ×9
SUT VIC AB 2-0 CT1 TAPERPNT 27 (SUTURE) ×3 IMPLANT
SUT VLOC 180 0 24IN GS25 (SUTURE) ×3 IMPLANT
SYR 50ML LL SCALE MARK (SYRINGE) ×3 IMPLANT
TOWEL OR 17X26 10 PK STRL BLUE (TOWEL DISPOSABLE) ×3 IMPLANT
TOWEL OR NON WOVEN STRL DISP B (DISPOSABLE) IMPLANT
TRAY FOLEY CATH 14FRSI W/METER (CATHETERS) ×3 IMPLANT
WATER STERILE IRR 1500ML POUR (IV SOLUTION) ×3 IMPLANT
WRAP KNEE MAXI GEL POST OP (GAUZE/BANDAGES/DRESSINGS) ×3 IMPLANT

## 2014-07-02 NOTE — Brief Op Note (Signed)
07/02/2014  3:40 PM  PATIENT:  Jacqueline [name]  80 y.o. female  PRE-OPERATIVE DIAGNOSIS:  RIGHT KNEE INFECTED TOTAL KNEE REPLACEMENT   POST-OPERATIVE DIAGNOSIS:  RIGHT KNEE INFECTED TOTAL KNEE REPLACEMENT   PROCEDURE:  Procedure(s): RIGHT TOTAL KNEE ARTHROPLASTY REMOVAL AND ANTIBIOTIC SPACER PLACEMENT (Right)  SURGEON:  Surgeon(s) and Role:    * Mauri Pole, MD - Primary  PHYSICIAN ASSISTANT: Danae Orleans, PA-C  ASSISTANTS: Netta Cedars, MD - observational  ANESTHESIA:   general  EBL:  100-150cc  BLOOD ADMINISTERED:none  DRAINS: (meduim) Hemovact drain(s) in the right knee with  Suction Open   LOCAL MEDICATIONS USED:  MARCAINE     SPECIMEN:  Source of Specimen:  right knee  DISPOSITION OF SPECIMEN:  PATHOLOGY  COUNTS:  YES  TOURNIQUET:  56min at 218mmHg  DICTATION: .Other Dictation: Dictation Number [id number]  PLAN OF CARE: Admit to inpatient   PATIENT DISPOSITION:  PACU - hemodynamically stable.   Delay start of Pharmacological VTE agent (>24hrs) due to surgical blood loss or risk of bleeding: no

## 2014-07-02 NOTE — Anesthesia Postprocedure Evaluation (Signed)
  Anesthesia Post-op Note  Patient: Jacqueline [name]  Procedure(s) Performed: Procedure(s) (LRB): Resection of Infected Right Total Knee Arthroplasty with placement antibiotic spacer (Right)  Patient Location: PACU  Anesthesia Type: General  Level of Consciousness: awake and alert   Airway and Oxygen Therapy: Patient Spontanous Breathing  Post-op Pain: mild  Post-op Assessment: Post-op Vital signs reviewed, Patient's Cardiovascular Status Stable, Respiratory Function Stable, Patent Airway and No signs of Nausea or vomiting  Last Vitals:  Filed Vitals:   07/02/14 1745  BP: 113/91  Pulse: 91  Temp: 36.7 C  Resp: 18    Post-op Vital Signs: stable   Complications: No apparent anesthesia complications

## 2014-07-02 NOTE — Progress Notes (Signed)
ANTIBIOTIC CONSULT NOTE - INITIAL  Pharmacy Consult for Vancomycin Indication: Infected right TKA, S/P resection and spacer placement  Allergies  Allergen Reactions  . Atorvastatin Other (See Comments)    Myalgias in legs  . Crestor [Rosuvastatin] Other (See Comments)    Myalgias in legs  . Morphine And Related Other (See Comments)    "Crazy thoughts, severe headache, sick"    Patient Measurements: Height: 5\' 1"  (154.9 cm) Weight: 149 lb (67.586 kg) IBW/kg (Calculated) : 47.8  Vital Signs: Temp: 97.6 F (36.4 C) (12/07 2100) Temp Source: Oral (12/07 2100) BP: 146/62 mmHg (12/07 2100) Pulse Rate: 56 (12/07 2100) Intake/Output from previous day:   Intake/Output from this shift:    Labs:  Recent Labs  07/02/14 1934  CREATININE 1.04   Estimated Creatinine Clearance: 37.3 mL/min (by C-G formula based on Cr of 1.04). No results for input(s): VANCOTROUGH, VANCOPEAK, VANCORANDOM, GENTTROUGH, GENTPEAK, GENTRANDOM, TOBRATROUGH, TOBRAPEAK, TOBRARND, AMIKACINPEAK, AMIKACINTROU, AMIKACIN in the last 72 hours.   Microbiology: Recent Results (from the past 720 hour(s))  Surgical pcr screen     Status: None   Collection Time: 06/25/14  1:53 PM  Result Value Ref Range Status   MRSA, PCR NEGATIVE NEGATIVE Final   Staphylococcus aureus NEGATIVE NEGATIVE Final    Comment:        The Xpert SA Assay (FDA approved for NASAL specimens in patients over 58 years of age), is one component of a comprehensive surveillance program.  Test performance has been validated by [employer] for patients greater than or equal to 48 year old. It is not intended to diagnose infection nor to guide or monitor treatment.     Medical History: Past Medical History  Diagnosis Date  . Hypertension   . CAD (coronary artery disease)   . History of stress test     Normal perfusion study which shows normal perfusion throughout and an EF 77%  . Hx of echocardiogram     Showed mild left ventricular  hypertrophy, perserved left ventricular systolic function, evidence of diastolic dysfunction, and a severly dilated left atrium, but minimal valvular abnormalities.  . Hyperlipemia   . Insomnia   . Diverticulitis 02/21/2012  . GERD (gastroesophageal reflux disease)   . Atrial fibrillation with rapid ventricular response 02/2014  . Arthritis     back   Assessment: 80 y/o F with PMH of HTN, CAD, HLD, diverticulitis, GERD, atrial fibrillation, arthritis, and previous R knee TKA complaining of R knee pain for 2 + months. Labs indicated elevated sed rate and CRP, and bone scan consistent with R knee arthroplasty device loosening or infection.  Patient underwent R TKA removal and placement of antibiotic spacer today.  Pharmacy consulted to assist with Vancomycin dosing for infected R TKA.  Patient received Cefazolin 2 grams at 1541 and Vancomycin 1 gram at 1611 prior to procedure today.  12/7 >> Cefazolin x 1 dose pre-op 12/7 >> Vancomycin  >>    Tmax: afebrile WBCs: WNL (on 11/30) Renal: SCr 1.04, CrCl ~ 37 mL/min CG  12/7 R knee synovial fluid: sent  Goal of Therapy:  Vancomycin trough level 15-20 mcg/ml Appropriate antibiotic dosing for renal function and indication Eradication of infection  Plan:   Vancomycin 1 gram IV 24h with next dose 12/8 at 1600.  Plan for Vancomycin trough level at steady state.  Monitor renal function, cultures, clinical course.   Lindell Spar, PharmD, BCPS Pager: [phone] 07/02/2014 9:13 PM

## 2014-07-02 NOTE — Transfer of Care (Signed)
Immediate Anesthesia Transfer of Care Note  Patient: Jacqueline [name]  Procedure(s) Performed: Procedure(s): Resection of Infected Right Total Knee Arthroplasty with placement antibiotic spacer (Right)  Patient Location: PACU  Anesthesia Type:General  Level of Consciousness: awake, alert , oriented and patient cooperative  Airway & Oxygen Therapy: Patient Spontanous Breathing and Patient connected to face mask oxygen  Post-op Assessment: Report given to PACU RN and Post -op Vital signs reviewed and stable  Post vital signs: Reviewed and stable  Complications: No apparent anesthesia complications

## 2014-07-02 NOTE — Interval H&P Note (Signed)
History and Physical Interval Note:  07/02/2014 3:04 PM  Jacqueline [name]  has presented today for surgery, with the diagnosis of RIGHT KNEE INFECTED TOTAL KNEE REPLACEMENT   The various methods of treatment have been discussed with the patient and family. After consideration of risks, benefits and other options for treatment, the patient has consented to  Procedure(s): RIGHT TOTAL KNEE ARTHROPLASTY REMOVAL AND ANTIBIOTIC SPACER PLACEMENT (Right) as a surgical intervention .  The patient's history has been reviewed, patient examined, no change in status, stable for surgery.  I have reviewed the patient's chart and labs.  Questions were answered to the patient's satisfaction.     Mauri Pole

## 2014-07-03 ENCOUNTER — Encounter (HOSPITAL_COMMUNITY): Payer: Self-pay | Admitting: Orthopedic Surgery

## 2014-07-03 DIAGNOSIS — T8453XA Infection and inflammatory reaction due to internal right knee prosthesis, initial encounter: Principal | ICD-10-CM

## 2014-07-03 LAB — BASIC METABOLIC PANEL
Anion gap: 14 (ref 5–15)
BUN: 25 mg/dL — AB (ref 6–23)
CO2: 23 mEq/L (ref 19–32)
CREATININE: 1.12 mg/dL — AB (ref 0.50–1.10)
Calcium: 8.8 mg/dL (ref 8.4–10.5)
Chloride: 101 mEq/L (ref 96–112)
GFR, EST AFRICAN AMERICAN: 52 mL/min — AB (ref >90)
GFR, EST NON AFRICAN AMERICAN: 45 mL/min — AB (ref >90)
Glucose, Bld: 143 mg/dL — ABNORMAL HIGH (ref 70–99)
Potassium: 4.4 mEq/L (ref 3.7–5.3)
Sodium: 138 mEq/L (ref 137–147)

## 2014-07-03 LAB — CBC
HEMATOCRIT: 25 % — AB (ref 36.0–46.0)
Hemoglobin: 8.3 g/dL — ABNORMAL LOW (ref 12.0–15.0)
MCH: 30.3 pg (ref 26.0–34.0)
MCHC: 33.2 g/dL (ref 30.0–36.0)
MCV: 91.2 fL (ref 78.0–100.0)
Platelets: 164 10*3/uL (ref 150–400)
RBC: 2.74 MIL/uL — ABNORMAL LOW (ref 3.87–5.11)
RDW: 14.4 % (ref 11.5–15.5)
WBC: 7.1 10*3/uL (ref 4.0–10.5)

## 2014-07-03 LAB — C-REACTIVE PROTEIN: CRP: 3.3 mg/dL — ABNORMAL HIGH (ref <0.60)

## 2014-07-03 LAB — SEDIMENTATION RATE: SED RATE: 53 mm/h — AB (ref 0–22)

## 2014-07-03 MED ORDER — CEFTRIAXONE SODIUM IN DEXTROSE 40 MG/ML IV SOLN
2.0000 g | INTRAVENOUS | Status: DC
  Administered 2014-07-03 – 2014-07-04 (×2): 2 g via INTRAVENOUS
  Filled 2014-07-03 (×3): qty 50

## 2014-07-03 MED ORDER — OXYCODONE HCL 5 MG PO TABS
5.0000 mg | ORAL_TABLET | ORAL | Status: DC
  Administered 2014-07-03 – 2014-07-05 (×10): 15 mg via ORAL
  Administered 2014-07-05: 5 mg via ORAL
  Administered 2014-07-05 (×2): 15 mg via ORAL
  Filled 2014-07-03 (×13): qty 3

## 2014-07-03 MED ORDER — SODIUM CHLORIDE 0.9 % IJ SOLN
10.0000 mL | INTRAMUSCULAR | Status: DC | PRN
  Administered 2014-07-04 – 2014-07-05 (×3): 10 mL
  Filled 2014-07-03 (×3): qty 40

## 2014-07-03 NOTE — Plan of Care (Signed)
Problem: Phase II Progression Outcomes Goal: Ambulates Outcome: Not Applicable Date Met:  71/06/26

## 2014-07-03 NOTE — Plan of Care (Signed)
Problem: Phase I Progression Outcomes Goal: CMS/Neurovascular status WDL Outcome: Completed/Met Date Met:  07/03/14 Goal: Pain controlled with appropriate interventions Outcome: Progressing

## 2014-07-03 NOTE — Progress Notes (Signed)
Clinical Social Work Department BRIEF PSYCHOSOCIAL ASSESSMENT 07/03/2014  Patient:  Jacqueline [name], Jacqueline [name]     Account Number:  [id number]     Admit date:  07/02/2014  Clinical Social Worker:  Lacie Scotts  Date/Time:  07/03/2014 03:57 PM  Referred by:  Physician  Date Referred:  07/03/2014 Referred for  SNF Placement   Other Referral:   Interview type:  Patient Other interview type:    PSYCHOSOCIAL DATA Living Status:  ALONE Admitted from facility:   Level of care:   Primary support name:  Manning Charity Primary support relationship to patient:  CHILD, ADULT Degree of support available:   supportive    CURRENT CONCERNS Current Concerns  Post-Acute Placement   Other Concerns:    SOCIAL WORK ASSESSMENT / PLAN Pt is an 80 yr old female living at home prior to hospitalization. CSW met with pt / family to assist with d/c planning. Pt has made prior arrangements to have [facility] Re[facility] at [facility] following hospital d/c. CSW has contacted SNF and d/c plan has been confirmed. Pt has [employer] which requires prior authorization. CSW has requested and received SNF authorization from insurance for 12/9. CSW will continue to follow to assist with d/c planning.   Assessment/plan status:  Psychosocial Support/Ongoing Assessment of Needs Other assessment/ plan:   Information/referral to community resources:   Insurance coverage for SNF and ambulance transport reviewed.    PATIENT'S/FAMILY'S RESPONSE TO PLAN OF CARE: Pt is relieved surgery is over. She has some discomfort but expects this to improve. Pt is motivated to work with therapy and is looking forward to having rehab at [facility].    Werner Lean LCSW [phone]

## 2014-07-03 NOTE — Progress Notes (Signed)
Peripherally Inserted Central Catheter/Midline Placement  The IV Nurse has discussed with the patient and/or persons authorized to consent for the patient, the purpose of this procedure and the potential benefits and risks involved with this procedure.  The benefits include less needle sticks, lab draws from the catheter and patient may be discharged home with the catheter.  Risks include, but not limited to, infection, bleeding, blood clot (thrombus formation), and puncture of an artery; nerve damage and irregular heat beat.  Alternatives to this procedure were also discussed.  PICC/Midline Placement Documentation        Jacqueline [name] 07/03/2014, 2:50 PM Consent obtained by Jacobo Forest, RN, CRNI

## 2014-07-03 NOTE — Evaluation (Signed)
Occupational Therapy Evaluation Patient Details Name: Jacqueline [name] MRN: [id number] DOB: 02/19/1931 Today's Date: 07/03/2014    History of Present Illness Pt is s/p R TKA removal and placement of spacer   Clinical Impression   This 80 year old female was admitted for the above surgery.  She will benefit from skilled OT to increase safety and independence with adls.  Goals are set for mostly min A level.  Pt will benefit from skilled OT in acute and follow up at SNF.    Follow Up Recommendations  SNF    Equipment Recommendations  3 in 1 bedside comode    Recommendations for Other Services       Precautions / Restrictions Precautions Precautions: Knee Precaution Comments: KI AAT Restrictions Weight Bearing Restrictions: Yes RLE Weight Bearing: Partial weight bearing      Mobility Bed Mobility Overal bed mobility: Needs Assistance Bed Mobility: Supine to Sit     Supine to sit: Min assist     General bed mobility comments: extra time, cues for technique and support for RLE  Transfers Overall transfer level: Needs assistance Equipment used: Rolling walker (2 wheeled) Transfers: Sit to/from [facility] Sit to Stand: Mod assist Stand pivot transfers: Mod assist       General transfer comment: cues for hand placement; pt unsteady hopping as she kept RLE NWB. She did not feel she could bear any weight    Balance                                            ADL Overall ADL's : Needs assistance/impaired     Grooming: Set up;Sitting   Upper Body Bathing: Set up;Sitting   Lower Body Bathing: Moderate assistance;Sit to/from stand   Upper Body Dressing : Minimal assistance;Sitting   Lower Body Dressing: Maximal assistance;Sit to/from stand   Toilet Transfer: Moderate assistance;Stand-pivot (recliner)             General ADL Comments: Pt performed SPT to recliner:  she hopped and kept RLE non-weight bearing but she is  allowed PWB.  Did not educate on AE this session.  Pt's pain was 7 but had been 10.     Vision                     Perception     Praxis      Pertinent Vitals/Pain Pain Assessment: 0-10 Pain Score: 7  Pain Location: R knee Pain Descriptors / Indicators: Aching;Sore Pain Intervention(s): Limited activity within patient's tolerance;Monitored during session;Premedicated before session;Repositioned     Hand Dominance     Extremity/Trunk Assessment Upper Extremity Assessment Upper Extremity Assessment: RUE deficits/detail RUE Deficits / Details: s/p RCR.  Pt is able to move RUE WFLs but has difficulty sustaining positions:  uses L to support R.  Pt states she needs RCR sx on L.  WFLs for adls           Communication Communication Communication: No difficulties   Cognition Arousal/Alertness: Awake/alert Behavior During Therapy: WFL for tasks assessed/performed Overall Cognitive Status: Within Functional Limits for tasks assessed                     General Comments       Exercises       Shoulder Instructions      Home Living Family/patient expects to  be discharged to:: Skilled nursing facility Living Arrangements: Alone                                      Prior Functioning/Environment Level of Independence: Independent             OT Diagnosis: Acute pain;Generalized weakness   OT Problem List: Decreased strength;Decreased activity tolerance;Impaired balance (sitting and/or standing);Decreased knowledge of use of DME or AE;Decreased knowledge of precautions;Pain   OT Treatment/Interventions: Self-care/ADL training;Energy conservation;Patient/family education;Balance training;DME and/or AE instruction    OT Goals(Current goals can be found in the care plan section) Acute Rehab OT Goals Patient Stated Goal: decreased pain.  Get back to being independent OT Goal Formulation: With patient Time For Goal Achievement:  07/10/14 Potential to Achieve Goals: Good ADL Goals Pt Will Perform Lower Body Bathing: with min assist;with adaptive equipment;sit to/from stand Pt Will Perform Lower Body Dressing: with mod assist;with adaptive equipment;sit to/from stand Pt Will Transfer to Toilet: with min assist;stand pivot transfer;bedside commode Pt Will Perform Toileting - Clothing Manipulation and hygiene: with min assist;sit to/from stand  OT Frequency: Min 2X/week   Barriers to D/C:            Co-evaluation              End of Session    Activity Tolerance: Patient tolerated treatment well Patient left: in chair;with call bell/phone within reach;with family/visitor present   Time: [id number] OT Time Calculation (min): 21 min Charges:  OT General Charges $OT Visit: 1 Procedure OT Evaluation $Initial OT Evaluation Tier I: 1 Procedure OT Treatments $Therapeutic Activity: 8-22 mins G-Codes:    Jacqueline [name] 07/23/2014, 10:05 AM  Jacqueline [name], OTR/L [phone] 07-23-14

## 2014-07-03 NOTE — Op Note (Signed)
NAME:  Jacqueline [name], Jacqueline [name] NO.:  [id number]  MEDICAL RECORD NO.:  [id number]  LOCATION:  [id number]                         FACILITY:  [facility]  PHYSICIAN:  Pietro Cassis. Alvan Dame, M.D.  DATE OF BIRTH:  August 26, 1930  DATE OF PROCEDURE:  07/02/2014 DATE OF DISCHARGE:                              OPERATIVE REPORT   PREOPERATIVE DIAGNOSIS:  Infected right total knee arthroplasty.  POSTOPERATIVE DIAGNOSIS:  Infected right total knee arthroplasty.  PROCEDURE: 1. Resection of infected right total knee arthroplasty. 2. Placement of antibiotic articulating spacer utilizing 3 batches of     cement with 3 g of vancomycin, 3.6 g of tobramycin total.  SURGEON:  Pietro Cassis. Alvan Dame, M.D.  ASSISTANT:  Danae Orleans, PA-C.  ANESTHESIA:  General.  DRAINS:  X1.  COMPLICATIONS:  None.  TOURNIQUET TIME:  70 minutes at 250 mmHg, one medium Hemovac utilized.  INDICATION OF PROCEDURE:  Jacqueline [name] is an 80 year old female who was kindly referred for consideration of a painful right total knee arthroplasty.  Her preclinical workup included aspiration that revealed elevated white blood cells within the synovium, synovial fluid, and elevated C-reactive protein, and sedimentation rate.  She also had a bone scan ordered, which was positive for increased uptake concerning for loosening or infection.  Given the clinical picture present, infection was the diagnosis.  Risks and benefits were assessed of the procedure and discussed with this 80 year old female with plan for two- stage procedure.  Indications and data provided to justify this type of procedure despite her age.  She has goals to return back to normal activity.  Consent was obtained after discussing the potential concern for recurrent infection as well as the perioperative plan from here.  PROCEDURE IN DETAIL:  The patient was brought to the operative theater. Once adequate anesthesia, preoperative antibiotics which were held until that time  of the incision completed Ancef 2 g as well as 1 g of vancomycin given the fact that previous aspiration in the office did not grow out any cultures.  The patient was positioned supine with the right thigh tourniquet placed.  The right lower extremity was then prepped and draped in sterile fashion in [facility] leg holder.  Time-out was performed identifying the patient, planned procedure, and extremity.  Leg was exsanguinated, tourniquet elevated to 250 mmHg.  Her old incision was excised.  Soft tissue planes created.  At the time of her arthrotomy, she was noted to have slightly cloudy-appearing synovial fluid for which I aspirated 10 mL of fluid and sent it off to be cultured as opposed to swabs.  At this point, I performed complete synovectomy debriding unhealthy- appearing synovial tissue.   Following this exposure even around the patella, I positioned the knee in a flexed position and using a thin ACL saw had to just minimally undermine the cement by interface before the tibial tray was obviously loose.  The same was done on the femur with a medial, lateral, anterior, and chamfer regions undermined with an ACL saw and the femoral component was removed without significant bone loss at all.  Further cement was removed.  At this point, the tibia was carefully subluxated anteriorly and the tibial  tray removed.  Further cement removed from the canal.  At this point using a canal brush, the femoral and tibial canals were irrigated with about a L of normal saline solution.  I then irrigated the knee with another 2 L of normal saline solution and pulse lavaged.  The patellar component was without difficulty as well including all cement.  Following removal of all 3 parts of the knee, we opened up 2 batches of cement with 2 g of vancomycin, 2.6 g of tobramycin.  A size medium femoral mold was opened and cement placed into the mold and then directly applied into the distal femur and held  until the cement fully cured.  At the same time, we created a tibial tray mold and allowed this to cure on the back table.  Once the femoral component had firmly set, the plastic mole was removed, we then mixed another batch of cement with vancomycin and tobramycin and cemented the tibial component and placed the knee in extension.  When the cement fully cured, we re-irrigated the knee with another 1-2 L of normal saline pulse lavage.  I injected the synovial capsule junction of the knee with 0.25% Marcaine with epinephrine and 1 mL of Toradol with added 30 mL normal saline for a total of 61 mL.  The extensor mechanism was then reapproximated over medium Hemovac drain exiting laterally over the knee with #1 PDS sutures.  Upon completion of the closure of the extensor mechanism, I injected intra-articularly topical based 2 g of tranexamic acid and tried to decrease initial postoperative bleeding.  Following this, the remainder of the wound was closed with 2-0 Vicryl and staples on the skin.  The skin was then cleaned, dried, and dressed sterilely using Xeroform and a bulky sterile wrap.  She was then brought to the recovery room with the knee immobilizer in place.  Findings were reviewed with family.  She will be admitted to the hospital.  PICC line will be placed.  We will allow for partial weightbearing and some motion of the knee.     Pietro Cassis Alvan Dame, M.D.     MDO/MEDQ  D:  07/02/2014  T:  07/03/2014  Job:  [id number]

## 2014-07-03 NOTE — Consult Note (Signed)
[facility]  Total days of antibiotics 2        Day 2 vanco        1 dose cefazolin               Reason for Consult:pji of right knee    Referring Physician: olin  Principal Problem:   S/P right TKA removal and spacer placement    HPI: Jacqueline [name] is a 80 y.o. female with past hx of of right TKA in 2006 by Dr. Veverly Fells who reports having right knee pain for the past 2 months. She was seen as an outpatient by her orthopedic surgeon who started work up for possible PJI.  Patient labs reported had elevated inflammatory markers SED rate = 67, CRP = 3.0 and bone scan revealed loosening / infection.She had outpatient arthrocentesis which revealed high white cells but culture negative, per Dr. Alvan Dame report. She was admitted on 12.07 to under go resection of infected right TKA and placement of antibiotic spacer with specimens sent to micro for culture. She was placed on vancomycin for post op. PICC line placed on 12/8. Remains afebrile, but having right knee pain since surgery, meds recently changed. She reports poor sleep due to knee pain overnight.  Past Medical History  Diagnosis Date  . Hypertension   . CAD (coronary artery disease)   . History of stress test     Normal perfusion study which shows normal perfusion throughout and an EF 77%  . Hx of echocardiogram     Showed mild left ventricular hypertrophy, perserved left ventricular systolic function, evidence of diastolic dysfunction, and a severly dilated left atrium, but minimal valvular abnormalities.  . Hyperlipemia   . Insomnia   . Diverticulitis 02/21/2012  . GERD (gastroesophageal reflux disease)   . Atrial fibrillation with rapid ventricular response 02/2014  . Arthritis     back    Allergies:  Allergies  Allergen Reactions  . Atorvastatin Other (See Comments)    Myalgias in legs  . Crestor [Rosuvastatin] Other (See Comments)    Myalgias in legs  . Morphine And Related Other (See Comments)      "Crazy thoughts, severe headache, sick"     MEDICATIONS: . ALPRAZolam  0.5 mg Oral BID  . apixaban  5 mg Oral BID  . celecoxib  200 mg Oral Q12H  . docusate sodium  100 mg Oral BID  . ferrous sulfate  325 mg Oral TID PC  . hydrochlorothiazide  12.5 mg Oral q morning - 10a  . metoprolol tartrate  25 mg Oral BID  . oxyCODONE  5-15 mg Oral 6 times per day  . pantoprazole  40 mg Oral Daily  . polyethylene glycol  17 g Oral BID  . rOPINIRole  0.25 mg Oral TID  . simvastatin  40 mg Oral QHS  . traZODone  50 mg Oral QHS  . vancomycin  1,000 mg Intravenous Q24H    History  Substance Use Topics  . Smoking status: Never Smoker   . Smokeless tobacco: Never Used  . Alcohol Use: No    Family History  Problem Relation Age of Onset  . Parkinson's disease Mother     Review of Systems  Constitutional: Negative for fever, chills, diaphoresis, activity change, appetite change, fatigue and unexpected weight change.  HENT: Negative for congestion, sore throat, rhinorrhea, sneezing, trouble swallowing and sinus pressure.  Eyes: Negative for photophobia and visual disturbance.  Respiratory: Negative for cough, chest  tightness, shortness of breath, wheezing and stridor.  Cardiovascular: Negative for chest pain, palpitations and leg swelling.  Gastrointestinal: Negative for nausea, vomiting, abdominal pain, diarrhea, constipation, blood in stool, abdominal distention and anal bleeding.  Genitourinary: Negative for dysuria, hematuria, flank pain and difficulty urinating.  Musculoskeletal: right knee pain.  Skin: Negative for color change, pallor, rash and wound.  Neurological: Negative for dizziness, tremors, weakness and light-headedness.  Hematological: Negative for adenopathy. Does not bruise/bleed easily.  Psychiatric/Behavioral: Negative for behavioral problems, confusion, sleep disturbance, dysphoric mood, decreased concentration and agitation.     OBJECTIVE: Temp:  [97.5 F (36.4  C)-98.1 F (36.7 C)] 97.7 F (36.5 C) (12/08 1511) Pulse Rate:  [48-91] 65 (12/08 1511) Resp:  [15-22] 16 (12/08 1511) BP: (91-166)/(41-145) 100/48 mmHg (12/08 1511) SpO2:  [95 %-100 %] 100 % (12/08 1511) Physical Exam  Constitutional:  oriented to person, place, and time. appears well-developed and well-nourished. No distress.  HENT:  Mouth/Throat: Oropharynx is clear and moist. No oropharyngeal exudate.  Cardiovascular: Normal rate, regular rhythm and normal heart sounds. Exam reveals no gallop and no friction rub.  No murmur heard.  Pulmonary/Chest: Effort normal and breath sounds normal. No respiratory distress.  has no wheezes.  Abdominal: Soft. Bowel sounds are normal.  exhibits no distension. There is no tenderness.  Lymphadenopathy: no cervical adenopathy.  Neurological: alert and oriented to person, place, and time.  Skin: Skin is warm and dry. No rash noted. No erythema.  Ext: right knee in brace with accordian drain in place with sanginous drainage Psychiatric: a normal mood and affect. behavior is normal.   LABS: Results for orders placed or performed during the hospital encounter of 07/02/14 (from the past 48 hour(s))  Protime-INR     Status: None   Collection Time: 07/02/14  1:30 PM  Result Value Ref Range   Prothrombin Time 13.4 11.6 - 15.2 seconds   INR 1.01 0.00 - 1.49  Anaerobic culture     Status: None (Preliminary result)   Collection Time: 07/02/14  4:12 PM  Result Value Ref Range   Specimen Description KNEE    Special Requests NONE    Gram Stain      FEW WBC PRESENT, PREDOMINANTLY PMN NO ORGANISMS SEEN Performed at [facility]    Culture      NO ANAEROBES ISOLATED; CULTURE IN PROGRESS FOR 5 DAYS Performed at [facility]    Report Status PENDING   Body fluid culture     Status: None (Preliminary result)   Collection Time: 07/02/14  4:12 PM  Result Value Ref Range   Specimen Description KNEE    Special Requests NONE    Gram Stain       FEW WBC PRESENT, PREDOMINANTLY PMN NO ORGANISMS SEEN Performed at [facility]    Culture NO GROWTH Performed at [facility]     Report Status PENDING   Creatinine, serum     Status: Abnormal   Collection Time: 07/02/14  7:34 PM  Result Value Ref Range   Creatinine, Ser 1.04 0.50 - 1.10 mg/dL   GFR calc non Af Amer 49 (L) >90 mL/min   GFR calc Af Amer 57 (L) >90 mL/min    Comment: (NOTE) The eGFR has been calculated using the CKD EPI equation. This calculation has not been validated in all clinical situations. eGFR's persistently <90 mL/min signify possible Chronic Kidney Disease.   CBC     Status: Abnormal   Collection Time: 07/03/14  4:25  AM  Result Value Ref Range   WBC 7.1 4.0 - 10.5 K/uL   RBC 2.74 (L) 3.87 - 5.11 MIL/uL   Hemoglobin 8.3 (L) 12.0 - 15.0 g/dL   HCT 25.0 (L) 36.0 - 46.0 %   MCV 91.2 78.0 - 100.0 fL   MCH 30.3 26.0 - 34.0 pg   MCHC 33.2 30.0 - 36.0 g/dL   RDW 14.4 11.5 - 15.5 %   Platelets 164 150 - 400 K/uL  Basic metabolic panel     Status: Abnormal   Collection Time: 07/03/14  4:25 AM  Result Value Ref Range   Sodium 138 137 - 147 mEq/L   Potassium 4.4 3.7 - 5.3 mEq/L   Chloride 101 96 - 112 mEq/L   CO2 23 19 - 32 mEq/L   Glucose, Bld 143 (H) 70 - 99 mg/dL   BUN 25 (H) 6 - 23 mg/dL   Creatinine, Ser 1.12 (H) 0.50 - 1.10 mg/dL   Calcium 8.8 8.4 - 10.5 mg/dL   GFR calc non Af Amer 45 (L) >90 mL/min   GFR calc Af Amer 52 (L) >90 mL/min    Comment: (NOTE) The eGFR has been calculated using the CKD EPI equation. This calculation has not been validated in all clinical situations. eGFR's persistently <90 mL/min signify possible Chronic Kidney Disease.    Anion gap 14 5 - 15   MICRO: 12/7 synovial fluid = few wbc, no organisms on gram stain   Assessment/Plan:  80yo F with right PJI POD#1 2 staged revision with tka resection and antibiotic spacer on 12/7, culture negative thus far.  - recommend 6 wk of vancomycin plus  ceftriaxone 2gm IV daily. vanco trough of 15-20. Will need weekly cbc with diff, bmp and vanco trough of 15-20. If vanco dose is changed, then needs trough and bmp checked before the 4th dose of new dosage regimen. - will check sed rate and crp - pain management - will defer to dr. Alvan Dame - will narrow antibiotic regimen if culture results identify a specific pathogen - will arrange for follow up in ID clinic in 5-6 wk  Petula Rotolo B. [facility] [phone]

## 2014-07-03 NOTE — Plan of Care (Signed)
Problem: Phase I Progression Outcomes Goal: Pain controlled with appropriate interventions Outcome: Completed/Met Date Met:  07/03/14 Goal: Initial discharge plan identified Outcome: Completed/Met Date Met:  07/03/14 Goal: Hemodynamically stable Outcome: Completed/Met Date Met:  07/03/14 Goal: Other Phase I Outcomes/Goals Outcome: Not Applicable Date Met:  27/51/70  Problem: Phase II Progression Outcomes Goal: Ambulates Outcome: Not Applicable Date Met:  01/74/94 oob to chair Goal: Tolerating diet Outcome: Completed/Met Date Met:  07/03/14 Goal: Discharge plan established Outcome: Completed/Met Date Met:  07/03/14 Goal: Other Phase II Outcomes/Goals Outcome: Not Applicable Date Met:  49/67/59

## 2014-07-03 NOTE — Progress Notes (Signed)
Informed by iv nurse that PICC is ready for use. Jacek Colson, [employer]

## 2014-07-03 NOTE — Progress Notes (Signed)
Patient ID: Jacqueline [name], female   DOB: [birth year], 80 y.o.   MRN: [id number] Subjective: 1 Day Post-Op Procedure(s) (LRB): Resection of Infected Right Total Knee Arthroplasty with placement antibiotic spacer (Right)    Patient reports pain as moderate.  More than she was expecting with regards to pain  Objective:   VITALS:   Filed Vitals:   07/03/14 1045  BP: 106/47  Pulse: 50  Temp:   Resp:     Neurovascular intact Incision: dressing C/D/I  LABS  Recent Labs  07/03/14 0425  HGB 8.3*  HCT 25.0*  WBC 7.1  PLT 164     Recent Labs  07/02/14 1934 07/03/14 0425  NA  --  138  K  --  4.4  BUN  --  25*  CREATININE 1.04 1.12*  GLUCOSE  --  143*     Recent Labs  07/02/14 1330  INR 1.01     Assessment/Plan: 1 Day Post-Op Procedure(s) (LRB): Resection of Infected Right Total Knee Arthroplasty with placement antibiotic spacer (Right)   Advance diet Up with therapy Continue ABX therapy due to infected knee arthroplasty PIC line ordered ID consult requested

## 2014-07-03 NOTE — Progress Notes (Signed)
Corrected sed rate of 53 now posted. Durell Lofaso, [employer]

## 2014-07-03 NOTE — Evaluation (Signed)
Physical Therapy Evaluation Patient Details Name: Jacqueline [name] MRN: [id number] DOB: 15-Jun-1931 Today's Date: 07/03/2014   History of Present Illness  Pt is s/p R TKA removal and placement of spacer  Clinical Impression  On eval, pt required Mod assist for mobility-able to perform stand pivot with RW, recliner to bed. Limited by pain, fatigue. Recommend ST rehab at SNF.     Follow Up Recommendations SNF    Equipment Recommendations       Recommendations for Other Services OT consult     Precautions / Restrictions Precautions Precautions: Knee Precaution Comments: KI AAT Required Braces or Orthoses: Knee Immobilizer - Right Knee Immobilizer - Right: On at all times Restrictions Weight Bearing Restrictions: Yes RLE Weight Bearing: Partial weight bearing RLE Partial Weight Bearing Percentage or Pounds: 50      Mobility  Bed Mobility Overal bed mobility: Needs Assistance Bed Mobility: Sit to Supine     Supine to sit: Min assist Sit to supine: Mod assist   General bed mobility comments: Assist for bil LEs into bed. Increased time.   Transfers Overall transfer level: Needs assistance Equipment used: Rolling walker (2 wheeled) Transfers: Sit to/from Stand Sit to Stand: Mod assist Stand pivot transfers: Mod assist       General transfer comment: VCs safety, technique, sequence, adherence to PWB status. Pt able to put a little weight through LE this session. Mobility limited by fatigue, pain.  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     Hand Dominance        Extremity/Trunk Assessment   Upper Extremity Assessment: Defer to OT evaluation RUE Deficits / Details: s/p RCR.  Pt is able to move RUE WFLs but has difficulty sustaining positions:  uses L to support R.  Pt states she needs RCR sx on L.  WFLs for adls         Lower Extremity Assessment: RLE deficits/detail;LLE deficits/detail   LLE Deficits / Details: WFL     Communication   Communication: No difficulties  Cognition Arousal/Alertness: Awake/alert Behavior During Therapy: WFL for tasks assessed/performed Overall Cognitive Status: Within Functional Limits for tasks assessed                      General Comments      Exercises        Assessment/Plan    PT Assessment Patient needs continued PT services  PT Diagnosis Difficulty walking;Abnormality of gait;Acute pain   PT Problem List  Decreased strength;Decreased activity tolerance;Decreased balance;Decreased mobility;Decreased knowledge of precautions;Decreased knowledge of use of DME;Pain  PT Treatment Interventions DME instruction;Gait training;Functional mobility training;Therapeutic activities;Patient/family education;Therapeutic exercise;Balance training   PT Goals (Current goals can be found in the Care Plan section) Acute Rehab PT Goals Patient Stated Goal: decreased pain.  Get back to being independent PT Goal Formulation: With patient Time For Goal Achievement: 07/10/14 Potential to Achieve Goals: Good    Frequency 7X/week   Barriers to discharge        Co-evaluation               End of Session Equipment Utilized During Treatment: Gait belt;Right knee immobilizer Activity Tolerance: Patient limited by  fatigue;Patient limited by pain Patient left: in bed;with call bell/phone within reach           Time: 1025-1057 PT Time Calculation (min) (ACUTE ONLY): 32 min   Charges:   PT Evaluation $Initial PT Evaluation Tier I: 1 Procedure PT Treatments $Therapeutic Activity: 23-37 mins   PT G Codes:          Weston Anna, MPT Pager: [phone]

## 2014-07-03 NOTE — Plan of Care (Signed)
Problem: Consults Goal: Total Joint Replacement Patient Education See Patient Education Module for education specifics.  Outcome: Completed/Met Date Met:  07/03/14 Goal: Diagnosis- Total Joint Replacement Outcome: Completed/Met Date Met:  07/03/14 Revision Total Knee Resection with Antibiotic Spacer placement Goal: Skin Care Protocol Initiated - if Braden Score 18 or less If consults are not indicated, leave blank or document N/A  Outcome: Not Applicable Date Met:  07/03/14 Goal: Nutrition Consult-if indicated Outcome: Not Applicable Date Met:  07/03/14 Goal: Diabetes Guidelines if Diabetic/Glucose > 140 If diabetic or lab glucose is > 140 mg/dl - Initiate Diabetes/Hyperglycemia Guidelines & Document Interventions  Outcome: Not Applicable Date Met:  07/03/14     

## 2014-07-03 NOTE — Progress Notes (Addendum)
Notified by lab that sed rate of113 entered @ 1745 is not accurate & will be notified with correct result when available. Dorwin Fitzhenry, [employer]

## 2014-07-03 NOTE — Progress Notes (Signed)
Clinical Social Work Department CLINICAL SOCIAL WORK PLACEMENT NOTE 07/03/2014  Patient:  Jacqueline [name], Jacqueline [name]  Account Number:  [id number] Admit date:  07/02/2014  Clinical Social Worker:  Werner Lean, LCSW  Date/time:  07/03/2014 04:18 PM  Clinical Social Work is seeking post-discharge placement for this patient at the following level of care:   SKILLED NURSING   (*CSW will update this form in Epic as items are completed)     Patient/family provided with [facility] Department of Clinical Social Work's list of facilities offering this level of care within the geographic area requested by the patient (or if unable, by the patient's family).  07/03/2014  Patient/family informed of their freedom to choose among providers that offer the needed level of care, that participate in Medicare, Medicaid or managed care program needed by the patient, have an available bed and are willing to accept the patient.    Patient/family informed of [facility]' ownership interest in [facility], as well as of the fact that they are under no obligation to receive care at this facility.  PASARR submitted to EDS on 07/02/2014 PASARR number received on 07/02/2014  FL2 transmitted to all facilities in geographic area requested by pt/family on  07/03/2014 FL2 transmitted to all facilities within larger geographic area on   Patient informed that his/her managed care company has contracts with or will negotiate with  certain facilities, including the following:     Patient/family informed of bed offers received:  07/03/2014 Patient chooses bed at  Physician recommends and patient chooses bed at    Patient to be transferred to  on   Patient to be transferred to facility by  Patient and family notified of transfer on  Name of family member notified:    The following physician request were entered in Epic:   Additional Comments:   Werner Lean LCSW [phone]

## 2014-07-03 NOTE — Care Management Note (Signed)
    Page 1 of 1   07/03/2014     2:36:13 PM CARE MANAGEMENT NOTE 07/03/2014  Patient:  Jacqueline [name], Jacqueline [name]   Account Number:  [id number]  Date Initiated:  07/03/2014  Documentation initiated by:  [facility]  Subjective/Objective Assessment:   adm: Resection of Infected Right Total Knee Arthroplasty with placement antibiotic spacer (Right     Action/Plan:   discharge planning   Anticipated DC Date:  07/04/2014   Anticipated DC Plan:  [place]  CM consult      Choice offered to / List presented to:             Status of service:   Medicare Important Message given?   (If response is "NO", the following Medicare IM given date fields will be blank) Date Medicare IM given:   Medicare IM given by:   Date Additional Medicare IM given:   Additional Medicare IM given by:    Discharge Disposition:  [place]  Per UR Regulation:    If discussed at Long Length of Stay Meetings, dates discussed:    Comments:  07/03/14 14:25 CM notes pt to go to SNF for rehab; CSW arranging.  No other CM needs were communicated.  Mariane Masters, BSN, Cm [phone].

## 2014-07-04 DIAGNOSIS — D500 Iron deficiency anemia secondary to blood loss (chronic): Secondary | ICD-10-CM

## 2014-07-04 DIAGNOSIS — N179 Acute kidney failure, unspecified: Secondary | ICD-10-CM

## 2014-07-04 DIAGNOSIS — R197 Diarrhea, unspecified: Secondary | ICD-10-CM

## 2014-07-04 DIAGNOSIS — R112 Nausea with vomiting, unspecified: Secondary | ICD-10-CM

## 2014-07-04 LAB — BASIC METABOLIC PANEL
Anion gap: 12 (ref 5–15)
BUN: 37 mg/dL — ABNORMAL HIGH (ref 6–23)
CO2: 25 mEq/L (ref 19–32)
Calcium: 8.1 mg/dL — ABNORMAL LOW (ref 8.4–10.5)
Chloride: 99 mEq/L (ref 96–112)
Creatinine, Ser: 1.4 mg/dL — ABNORMAL HIGH (ref 0.50–1.10)
GFR calc Af Amer: 40 mL/min — ABNORMAL LOW (ref >90)
GFR calc non Af Amer: 34 mL/min — ABNORMAL LOW (ref >90)
Glucose, Bld: 141 mg/dL — ABNORMAL HIGH (ref 70–99)
Potassium: 4.5 mEq/L (ref 3.7–5.3)
Sodium: 136 mEq/L — ABNORMAL LOW (ref 137–147)

## 2014-07-04 LAB — CBC
HCT: 22.2 % — ABNORMAL LOW (ref 36.0–46.0)
HEMOGLOBIN: 7.5 g/dL — AB (ref 12.0–15.0)
MCH: 31.3 pg (ref 26.0–34.0)
MCHC: 33.8 g/dL (ref 30.0–36.0)
MCV: 92.5 fL (ref 78.0–100.0)
PLATELETS: 151 10*3/uL (ref 150–400)
RBC: 2.4 MIL/uL — ABNORMAL LOW (ref 3.87–5.11)
RDW: 14.5 % (ref 11.5–15.5)
WBC: 7.9 10*3/uL (ref 4.0–10.5)

## 2014-07-04 LAB — VANCOMYCIN, TROUGH: Vancomycin Tr: 13.4 ug/mL (ref 10.0–20.0)

## 2014-07-04 LAB — PREPARE RBC (CROSSMATCH)

## 2014-07-04 MED ORDER — LOPERAMIDE HCL 2 MG PO CAPS: 2.0000 mg | ORAL_CAPSULE | ORAL | Status: DC | PRN

## 2014-07-04 MED ORDER — SODIUM CHLORIDE 0.9 % IV SOLN: Freq: Once | INTRAVENOUS | Status: DC

## 2014-07-04 NOTE — Plan of Care (Signed)
Problem: Phase III Progression Outcomes Goal: Pain controlled on oral analgesia Outcome: Completed/Met Date Met:  07/04/14 Goal: Ambulates Outcome: Progressing Goal: Incision clean - minimal/no drainage Outcome: Completed/Met Date Met:  07/04/14 Goal: Discharge plan remains appropriate-arrangements made Outcome: Completed/Met Date Met:  07/04/14 Goal: Anticoagulant follow-up in place Outcome: Completed/Met Date Met:  07/04/14

## 2014-07-04 NOTE — Progress Notes (Signed)
Pharmacy - Vancomycin  Vanc trough = 13.9mg /l below target range but not yet at steady-state and could accumulate.   Plan: Cont Vanc 1g IV q24h for now. Recheck SCr in the AM. Repeat Vanc trough when appropriate.  Romeo Rabon, PharmD, pager [phone]. 07/04/2014,4:42 PM.

## 2014-07-04 NOTE — Progress Notes (Signed)
ANTIBIOTIC CONSULT NOTE - follow up  Pharmacy Consult for Vancomycin Indication: Infected right TKA, S/P resection and spacer placement  Allergies  Allergen Reactions  . Atorvastatin Other (See Comments)    Myalgias in legs  . Crestor [Rosuvastatin] Other (See Comments)    Myalgias in legs  . Morphine And Related Other (See Comments)    "Crazy thoughts, severe headache, sick"   Patient Measurements: Height: 5\' 1"  (154.9 cm) Weight: 149 lb (67.586 kg) IBW/kg (Calculated) : 47.8  Vital Signs: Temp: 97.8 F (36.6 C) (12/09 1255) Temp Source: Oral (12/09 1255) BP: 108/60 mmHg (12/09 1255) Pulse Rate: 57 (12/09 1255) Intake/Output from previous day: 12/08 0701 - 12/09 0700 In: 3257 [P.O.:720; I.V.:2537] Out: 616 [Urine:600; Drains:15; Stool:1] Intake/Output from this shift: Total I/O In: 440 [P.O.:120; Blood:320] Out: 2 [Stool:2]  Labs:  Recent Labs  07/02/14 1934 07/03/14 0425 07/04/14 0425  WBC  --  7.1 7.9  HGB  --  8.3* 7.5*  PLT  --  164 151  CREATININE 1.04 1.12* 1.40*   Estimated Creatinine Clearance: 27.7 mL/min (by C-G formula based on Cr of 1.4). No results for input(s): VANCOTROUGH, VANCOPEAK, VANCORANDOM, GENTTROUGH, GENTPEAK, GENTRANDOM, TOBRATROUGH, TOBRAPEAK, TOBRARND, AMIKACINPEAK, AMIKACINTROU, AMIKACIN in the last 72 hours.   Microbiology: Recent Results (from the past 720 hour(s))  Surgical pcr screen     Status: None   Collection Time: 06/25/14  1:53 PM  Result Value Ref Range Status   MRSA, PCR NEGATIVE NEGATIVE Final   Staphylococcus aureus NEGATIVE NEGATIVE Final    Comment:        The Xpert SA Assay (FDA approved for NASAL specimens in patients over 40 years of age), is one component of a comprehensive surveillance program.  Test performance has been validated by [employer] for patients greater than or equal to 24 year old. It is not intended to diagnose infection nor to guide or monitor treatment.   Anaerobic culture      Status: None (Preliminary result)   Collection Time: 07/02/14  4:12 PM  Result Value Ref Range Status   Specimen Description KNEE  Final   Special Requests NONE  Final   Gram Stain   Final    FEW WBC PRESENT, PREDOMINANTLY PMN NO ORGANISMS SEEN Performed at [facility]    Culture   Final    NO ANAEROBES ISOLATED; CULTURE IN PROGRESS FOR 5 DAYS Performed at [facility]    Report Status PENDING  Incomplete  Body fluid culture     Status: None (Preliminary result)   Collection Time: 07/02/14  4:12 PM  Result Value Ref Range Status   Specimen Description KNEE  Final   Special Requests NONE  Final   Gram Stain   Final    FEW WBC PRESENT, PREDOMINANTLY PMN NO ORGANISMS SEEN Performed at [facility]    Culture   Final    NO GROWTH 1 DAY Performed at [facility]    Report Status PENDING  Incomplete   Medical History: Past Medical History  Diagnosis Date  . Hypertension   . CAD (coronary artery disease)   . History of stress test     Normal perfusion study which shows normal perfusion throughout and an EF 77%  . Hx of echocardiogram     Showed mild left ventricular hypertrophy, perserved left ventricular systolic function, evidence of diastolic dysfunction, and a severly dilated left atrium, but minimal valvular abnormalities.  . Hyperlipemia   . Insomnia   .  Diverticulitis 02/21/2012  . GERD (gastroesophageal reflux disease)   . Atrial fibrillation with rapid ventricular response 02/2014  . Arthritis     back   Assessment: 80 y/o F with PMH of HTN, CAD, HLD, diverticulitis, GERD, atrial fibrillation, arthritis, and previous R knee TKA complaining of R knee pain for 2 + months. Labs indicated elevated sed rate and CRP, and bone scan consistent with R knee arthroplasty device loosening or infection.  Patient underwent R TKA removal and placement of antibiotic spacer today.  Pharmacy consulted to assist with Vancomycin dosing for infected R  TKA.  Patient received Cefazolin 2 grams at 1541 and Vancomycin 1 gram at 1611 prior to procedure today.  12/7 >> Cefazolin x 1 dose pre-op 12/7 >> Vancomycin  >>    Tmax: afebrile WBCs: WNL  Renal: SCr 1.4, Cl ~ 36N  12/7 R knee synovial fluid: ngtd  Goal of Therapy:  Vancomycin trough level 15-20 mcg/ml Appropriate antibiotic dosing for renal function and indication Eradication of infection  Plan:   Vancomycin 1 gram IV 24h   Vanc trough today (not yet at steady state) for elevated SCr  Monitor renal function, cultures, clinical course.  Minda Ditto PharmD Pager [phone] 07/04/2014, 1:48 PM

## 2014-07-04 NOTE — Clinical Documentation Improvement (Signed)
Please clarify . Thank you  . Documentation of Anemia should include the type of anemia and Acuity: --Nutritional --Hemolytic --Due to blood loss --Other (please specify) . Include in documentation if Anemia is due to nutrition or mineral deficits, resulting in a nutritional anemia . Document any "cause-and-effect" relationship between the intervention and the blood or immune disorder . Link any laboratory findings to a related diagnosis (if appropriate) . Document any associated diagnoses/conditions  Supporting Information:  S/P Resection of infected right total knee arthroplasty. 2. Placement of antibiotic articulating spacer utilizing 3 batches of  cement with 3 g of vancomycin, 3.6 g of tobramycin total.  Component     Latest Ref Rng 07/03/2014 07/04/2014           Hemoglobin     12.0 - 15.0 g/dL 8.3 (L) 7.5 (L)  HCT     36.0 - 46.0 % 25.0 (L) 22.2 (L)   07/04/14 progress note:  2 units of blood ordered, and to be given this morning.   Thank You, Iria Jamerson T. Pricilla Handler, MSN, MBA/MHA Clinical Documentation Specialist [email or web address] Office # [phone]

## 2014-07-04 NOTE — Progress Notes (Signed)
[facility] for Infectious Disease    Date of Admission:  07/02/2014   Total days of antibiotics 3        Day 3 vanco        Day 2 ceftriaxone           ID: Jacqueline [name] is a 80 y.o. female with right PJI POD#2 s/p resection of prosthesis and antibiotic spacer placement  Principal Problem:   S/P right TKA removal and spacer placement    Subjective: Afebrile, still having right knee pain, poor sleep due to pain. Having more complaints today due to diarrhea, 5 BM loosely formed stools and N/V at lunch  Medications:  . sodium chloride   Intravenous Once  . ALPRAZolam  0.5 mg Oral BID  . apixaban  5 mg Oral BID  . cefTRIAXone (ROCEPHIN)  IV  2 g Intravenous Q24H  . docusate sodium  100 mg Oral BID  . ferrous sulfate  325 mg Oral TID PC  . hydrochlorothiazide  12.5 mg Oral q morning - 10a  . metoprolol tartrate  25 mg Oral BID  . oxyCODONE  5-15 mg Oral 6 times per day  . pantoprazole  40 mg Oral Daily  . polyethylene glycol  17 g Oral BID  . rOPINIRole  0.25 mg Oral TID  . simvastatin  40 mg Oral QHS  . traZODone  50 mg Oral QHS  . vancomycin  1,000 mg Intravenous Q24H    Objective: Vital signs in last 24 hours: Temp:  [97 F (36.1 C)-98.1 F (36.7 C)] 97.8 F (36.6 C) (12/09 1255) Pulse Rate:  [55-115] 57 (12/09 1255) Resp:  [14-18] 16 (12/09 1255) BP: (94-141)/(48-77) 108/60 mmHg (12/09 1255) SpO2:  [92 %-100 %] 100 % (12/09 1255) Physical Exam  Constitutional:  oriented to person, place, and time. appears well-developed and well-nourished. No distress.  HENT:  Mouth/Throat: Oropharynx is clear and moist. No oropharyngeal exudate.  Abd: ntnd, bS+, no guarding Ext: right knee in brace, drain removed Skin: Skin is warm and dry. No rash noted. No erythema.    Lab Results  Recent Labs  07/03/14 0425 07/04/14 0425  WBC 7.1 7.9  HGB 8.3* 7.5*  HCT 25.0* 22.2*  NA 138 136*  K 4.4 4.5  CL 101 99  CO2 23 25  BUN 25* 37*  CREATININE 1.12* 1.40*    Liver Panel No results for input(s): PROT, ALBUMIN, AST, ALT, ALKPHOS, BILITOT, BILIDIR, IBILI in the last 72 hours. Sedimentation Rate  Recent Labs  07/03/14 1745  ESRSEDRATE 53*   C-Reactive Protein  Recent Labs  07/03/14 1745  CRP 3.3*    Microbiology: 12/7 cx: NGTD at 48hr  Assessment/Plan: Pji, likely culture negative = no growth at 48hr. Will recommend to do 6 wk of vancomycin plus ceftriaxone 2gm IV q day. vanco dosing will depend on her kidney function. Today cr elevated from yesterday. vanco trough should be 15-20. Await to be checked before the 4th dose tomorrow.  aki = await to see if improved with blood tsf today. May need fluids if it keeps worsening. Await vanco level tomorrow to see that drug is dosed appropriatedly  Anemia due to blood loss from surgery= receiving blood tsf today  Diarrhea = likely from miralax and colace. Currently on hold  Nausea/vomiting = could be due to side effect from meds  Such as iron. Continue with prn anti emetics. Consider switching iron administration after meals   Jacqueline [name], [facility] Cell: [phone] Pager: [phone]  07/04/2014, 2:04 PM

## 2014-07-04 NOTE — Progress Notes (Addendum)
     Subjective: 2 Days Post-Op Procedure(s) (LRB): Resection of Infected Right Total Knee Arthroplasty with placement antibiotic spacer (Right)   Patient reports pain as mild, pain controlled. No events throughout the night. Having some issues with heart racing this morning. HGB down and had a bump in her Creatine.  Objective:   VITALS:   Filed Vitals:   07/04/14  BP: 141/66  Pulse: 83  Temp: 98 F (36.7 C)   Resp: 18    Dorsiflexion/Plantar flexion intact Incision: dressing C/D/I No cellulitis present Compartment soft  LABS  Recent Labs  07/03/14 0425 07/04/14 0425  HGB 8.3* 7.5*  HCT 25.0* 22.2*  WBC 7.1 7.9  PLT 164 151     Recent Labs  07/02/14 1934 07/03/14 0425 07/04/14 0425  NA  --  138 136*  K  --  4.4 4.5  BUN  --  25* 37*  CREATININE 1.04 1.12* 1.40*  GLUCOSE  --  143* 141*     Assessment/Plan: 2 Days Post-Op Procedure(s) (LRB): Resection of Infected Right Total Knee Arthroplasty with placement antibiotic spacer (Right)  Up with therapy Discharge to SNF eventually, when ready   ABLA Transfuse 2 units of blood today, repeat labs in the AM.     Jacqueline [name]. Jacqueline [name]   PAC  07/04/2014, 9:08 AM

## 2014-07-04 NOTE — Progress Notes (Signed)
PT Cancellation Note  Patient Details Name: Jacqueline [name] MRN: [id number] DOB: 1931/04/17   Cancelled Treatment:    Reason Eval/Treat Not Completed: C/M in am. for low hgb-pt received 2 units of blood. Checked back with pt for pm-pt and RN reported pt having  mutliple bowel movements and vomiting. Also noted pt in need of new KI due to previous one getting soiled. Pt kindly requested PT check back in am. Contacted ortho tech about getting new KI for pt.    Weston Anna, MPT Pager: [phone]

## 2014-07-04 NOTE — Progress Notes (Addendum)
PT Cancellation Note  Patient Details Name: Jacqueline [name] MRN: [id number] DOB: [birth year]   Cancelled Treatment:    Reason Eval/Treat Not Completed: Medical issues which prohibited therapy--hgb 7.5-pt set to recieve 2 units of blood and elevated HR at rest. Will attempt to check back later as schedule permits/if pt able to participate. Thanks.    Weston Anna, MPT Pager: [phone]

## 2014-07-05 LAB — CBC
HEMATOCRIT: 30.4 % — AB (ref 36.0–46.0)
Hemoglobin: 10 g/dL — ABNORMAL LOW (ref 12.0–15.0)
MCH: 30.2 pg (ref 26.0–34.0)
MCHC: 32.9 g/dL (ref 30.0–36.0)
MCV: 91.8 fL (ref 78.0–100.0)
Platelets: 147 10*3/uL — ABNORMAL LOW (ref 150–400)
RBC: 3.31 MIL/uL — ABNORMAL LOW (ref 3.87–5.11)
RDW: 15.2 % (ref 11.5–15.5)
WBC: 10.4 10*3/uL (ref 4.0–10.5)

## 2014-07-05 LAB — TYPE AND SCREEN
ABO/RH(D): O NEG
Antibody Screen: NEGATIVE
UNIT DIVISION: 0
Unit division: 0

## 2014-07-05 LAB — BASIC METABOLIC PANEL WITH GFR
Anion gap: 10 (ref 5–15)
BUN: 33 mg/dL — ABNORMAL HIGH (ref 6–23)
CO2: 25 meq/L (ref 19–32)
Calcium: 8.6 mg/dL (ref 8.4–10.5)
Chloride: 101 meq/L (ref 96–112)
Creatinine, Ser: 1.26 mg/dL — ABNORMAL HIGH (ref 0.50–1.10)
GFR calc Af Amer: 45 mL/min — ABNORMAL LOW
GFR calc non Af Amer: 39 mL/min — ABNORMAL LOW
Glucose, Bld: 97 mg/dL (ref 70–99)
Potassium: 4.4 meq/L (ref 3.7–5.3)
Sodium: 136 meq/L — ABNORMAL LOW (ref 137–147)

## 2014-07-05 MED ORDER — OXYCODONE HCL 5 MG PO TABS: 5.0000 mg | ORAL_TABLET | ORAL | Status: DC | PRN

## 2014-07-05 MED ORDER — CEFTRIAXONE SODIUM IN DEXTROSE 40 MG/ML IV SOLN: 2.0000 g | INTRAVENOUS | Status: DC

## 2014-07-05 MED ORDER — DSS 100 MG PO CAPS: 100.0000 mg | ORAL_CAPSULE | Freq: Two times a day (BID) | ORAL | Status: DC | PRN

## 2014-07-05 MED ORDER — VANCOMYCIN HCL IN DEXTROSE 1-5 GM/200ML-% IV SOLN: 1000.0000 mg | INTRAVENOUS | Status: DC

## 2014-07-05 MED ORDER — FERROUS SULFATE 325 (65 FE) MG PO TABS: 325.0000 mg | ORAL_TABLET | Freq: Three times a day (TID) | ORAL | Status: DC

## 2014-07-05 MED ORDER — HEPARIN SOD (PORK) LOCK FLUSH 100 UNIT/ML IV SOLN
250.0000 [IU] | INTRAVENOUS | Status: AC | PRN
  Administered 2014-07-05: 250 [IU]

## 2014-07-05 MED ORDER — LOPERAMIDE HCL 2 MG PO CAPS: 2.0000 mg | ORAL_CAPSULE | ORAL | Status: DC | PRN

## 2014-07-05 MED ORDER — TIZANIDINE HCL 4 MG PO TABS: 4.0000 mg | ORAL_TABLET | Freq: Four times a day (QID) | ORAL | Status: DC | PRN

## 2014-07-05 NOTE — Progress Notes (Signed)
Clinical Social Work Department CLINICAL SOCIAL WORK PLACEMENT NOTE 07/05/2014  Patient:  Jacqueline [name], Jacqueline [name]  Account Number:  [id number] Admit date:  07/02/2014  Clinical Social Worker:  Werner Lean, LCSW  Date/time:  07/03/2014 04:18 PM  Clinical Social Work is seeking post-discharge placement for this patient at the following level of care:   SKILLED NURSING   (*CSW will update this form in Epic as items are completed)     Patient/family provided with [facility] Department of Clinical Social Work's list of facilities offering this level of care within the geographic area requested by the patient (or if unable, by the patient's family).  07/03/2014  Patient/family informed of their freedom to choose among providers that offer the needed level of care, that participate in Medicare, Medicaid or managed care program needed by the patient, have an available bed and are willing to accept the patient.    Patient/family informed of [facility]' ownership interest in [facility], as well as of the fact that they are under no obligation to receive care at this facility.  PASARR submitted to EDS on 07/02/2014 PASARR number received on 07/02/2014  FL2 transmitted to all facilities in geographic area requested by pt/family on  07/03/2014 FL2 transmitted to all facilities within larger geographic area on   Patient informed that his/her managed care company has contracts with or will negotiate with  certain facilities, including the following:     Patient/family informed of bed offers received:  07/03/2014 Patient chooses bed at [facility] Physician recommends and patient chooses bed at    Patient to be transferred to [facility] on  07/05/2014 Patient to be transferred to facility by [facility] Patient and family notified of transfer on 07/05/2014 Name of family member notified:  Granddaughter  The following physician request were  entered in Epic:   Additional Comments: Pt / granddaughter are in agreement with d/c to SNF today. [employer] provided SNF and ambulance authorization. Pt is aware that copayments may be involved with ambulance transport. NSG reviewed d/c summary, scripts, avs. Scripts included in d/c packet.  Werner Lean LCSW [phone]

## 2014-07-05 NOTE — Progress Notes (Signed)
Report called to amy Rn at [facility]

## 2014-07-05 NOTE — Progress Notes (Signed)
Physical Therapy Treatment Patient Details Name: Jacqueline [name] MRN: [id number] DOB: 1931-04-14 Today's Date: 07/05/2014    History of Present Illness Pt is s/p R TKA removal and placement of spacer    PT Comments    Pt pleasant and cooperative but with increased time required 2* pain and ltd endurance.  Pt tolerating min weight on R LE.  Follow Up Recommendations  SNF     Equipment Recommendations       Recommendations for Other Services OT consult     Precautions / Restrictions Precautions Precautions: Knee Precaution Comments: KI AAT Required Braces or Orthoses: Knee Immobilizer - Right Knee Immobilizer - Right: On at all times Restrictions Weight Bearing Restrictions: Yes RLE Weight Bearing: Partial weight bearing RLE Partial Weight Bearing Percentage or Pounds: 50    Mobility  Bed Mobility Overal bed mobility: Needs Assistance Bed Mobility: Supine to Sit;Sit to Supine     Supine to sit: Min assist Sit to supine: Min assist   General bed mobility comments: assist for R LE; cues for sequence  Transfers Overall transfer level: Needs assistance Equipment used: Rolling walker (2 wheeled) Transfers: Sit to/from Stand Sit to Stand: Mod assist Stand pivot transfers: Mod assist       General transfer comment: VCs safety, technique, sequence, adherence to PWB status. Pt able to put a little weight through LE this session. Mobility limited by fatigue, pain and incontinence of urine  Ambulation/Gait Ambulation/Gait assistance: Mod assist Ambulation Distance (Feet): 2 Feet Assistive device: Rolling walker (2 wheeled) Gait Pattern/deviations: Step-to pattern;Decreased step length - right;Decreased step length - left;Shuffle;Trunk flexed Gait velocity: decr   General Gait Details: Cues for sequence, posture, position from RW and UE WB   Stairs            Wheelchair Mobility    Modified Rankin (Stroke Patients Only)       Balance                                    Cognition Arousal/Alertness: Awake/alert Behavior During Therapy: WFL for tasks assessed/performed Overall Cognitive Status: Within Functional Limits for tasks assessed                      Exercises General Exercises - Lower Extremity Ankle Circles/Pumps: AROM;Both;15 reps;Supine Quad Sets: AROM;Both;15 reps;Supine Gluteal Sets: AROM;Both;15 reps;Supine Hip ABduction/ADduction: AAROM;15 reps;Supine;Right Straight Leg Raises: AAROM;Right;15 reps;Supine    General Comments        Pertinent Vitals/Pain Pain Assessment: 0-10 Pain Score: 5  Pain Location: R knee Pain Descriptors / Indicators: Sore Pain Intervention(s): Limited activity within patient's tolerance;Monitored during session;Premedicated before session    Home Living                      Prior Function            PT Goals (current goals can now be found in the care plan section) Acute Rehab PT Goals Patient Stated Goal: decreased pain.  Get back to being independent PT Goal Formulation: With patient Time For Goal Achievement: 07/10/14 Potential to Achieve Goals: Good Progress towards PT goals: Progressing toward goals    Frequency  7X/week    PT Plan Current plan remains appropriate    Co-evaluation             End of Session Equipment Utilized During Treatment: Gait belt;Right knee immobilizer Activity  Tolerance: Patient limited by pain;Patient limited by fatigue;Patient tolerated treatment well Patient left: in bed;with call bell/phone within reach     Time: 1015-1055 PT Time Calculation (min) (ACUTE ONLY): 40 min  Charges:  $Gait Training: 8-22 mins $Therapeutic Exercise: 8-22 mins $Therapeutic Activity: 8-22 mins                    G Codes:      Rohan Juenger 07/09/14, 11:40 AM

## 2014-07-05 NOTE — Discharge Summary (Signed)
Physician Discharge Summary  Patient ID: Jacqueline [name] MRN: [id number] DOB/AGE: 12/15/1930 80 y.o.  Admit date: 07/02/2014 Discharge date:  07/05/2014  Procedures:  Procedure(s) (LRB): Resection of Infected Right Total Knee Arthroplasty with placement antibiotic spacer (Right)  Attending Physician:  Dr. Paralee Cancel   Admission Diagnoses:   Right knee infected total knee arthroplasty  Discharge Diagnoses:  Principal Problem:   S/P right TKA removal and spacer placement  Past Medical History  Diagnosis Date  . Hypertension   . CAD (coronary artery disease)   . History of stress test     Normal perfusion study which shows normal perfusion throughout and an EF 77%  . Hx of echocardiogram     Showed mild left ventricular hypertrophy, perserved left ventricular systolic function, evidence of diastolic dysfunction, and a severly dilated left atrium, but minimal valvular abnormalities.  . Hyperlipemia   . Insomnia   . Diverticulitis 02/21/2012  . GERD (gastroesophageal reflux disease)   . Atrial fibrillation with rapid ventricular response 02/2014  . Arthritis     back    HPI: Pt is a 80 y.o. female complaining of right knee pain for 2+ months. Pain had continually increased since the beginning. X-rays in the clinic show previous TKA of the right knee. Pt knee has been aspirated and lab work was obtained. Patient labs results indicate an infected right TKA. SED rate = 67 (0-22), CRP = 3.0 (<0.60) and bone scan revealed loosening / infection. The original replacement was preformed by Dr. Veverly Fells in 2006, he is the one that has consulted Dr. Alvan Dame. Various options are discussed with the patient. Risks, benefits and expectations were discussed with the patient. Patient understand the risks, benefits and expectations and wishes to proceed with surgery.  PCP: Zigmund Gottron, MD   Discharged Condition: good  Hospital Course:  Patient underwent the above stated procedure on  07/02/2014. Patient tolerated the procedure well and brought to the recovery room in good condition and subsequently to the floor.  POD #1 BP: 106/47 ; Pulse: 50 ; Temp: 97.5 F (36.4 C) ; Resp: 15 Patient reports pain as moderate. More than she was expecting with regards to pain. Dorsiflexion/plantar flexion intact, incision: dressing C/D/I, no cellulitis present and compartment soft.   LABS  Basename    HGB  8.3  HCT  25.0  BUN 25  CREATININE 1.12   POD #2  BP: 141/66 ; Pulse: 83 ; Temp: 98 F (36.7 C) ; Resp: 18 Patient reports pain as mild, pain controlled. No events throughout the night. Having some issues with heart racing this morning. HGB down and had a bump in her Creatine.  Received 2 units of blood.  LABS  Basename    HGB  7.5  HCT  22.2  BUN 37  CREATININE 1.40   POD #3  BP: 91/50 ; Pulse: 59 ; Temp: 98.6 F (37 C) ; Resp: 16 Patient reports pain as mild, pain controlled. Feels much better after receiving 2 units of blood yesterday. She is also less nauseated and no longer having loose stools. She states that she slept through the night without difficulties. Feels ready to be discharged to SNF. Dorsiflexion/plantar flexion intact, incision: dressing C/D/I, no cellulitis present and compartment soft.   LABS  Basename    HGB  10.0  HCT  30.4  BUN 33  CREATININE 1.26     Discharge Exam: General appearance: alert, cooperative and no distress Extremities: Homans sign is negative, no sign of  DVT, no edema, redness or tenderness in the calves or thighs and no ulcers, gangrene or trophic changes  Disposition:     Skilled nursing facility with follow up in 2 weeks   Follow-up Information    Follow up with Mauri Pole, MD. Schedule an appointment as soon as possible for a visit in 2 weeks.   Specialty:  Orthopedic Surgery   Contact information:   [address] [place] [address] [id number]           Discharge Instructions     Call MD / Call 911    Complete by:  As directed   If you experience chest pain or shortness of breath, CALL 911 and be transported to the hospital emergency room.  If you develope a fever above 101 F, pus (white drainage) or increased drainage or redness at the wound, or calf pain, call your surgeon's office.     Change dressing    Complete by:  As directed   Daily dressing changes with 4x4 gauze and tape.     Constipation Prevention    Complete by:  As directed   Drink plenty of fluids.  Prune juice may be helpful.  You may use a stool softener, such as Colace (over the counter) 100 mg twice a day.  Use MiraLax (over the counter) for constipation as needed.     Diet - low sodium heart healthy    Complete by:  As directed      Discharge instructions    Complete by:  As directed   Daily dressing changes with 4x4 gauze and tape.  Knee immobilizer for stability while walking, and for comfort.  Follow up in 2 [facility] at [facility]. Call with any questions or concerns.  Vancomycin trough should be pulled prior to dosing the Vancomycin on 07/06/2014, then as appropriate to continue to monitor and allow pharmacy to adjust medication obtain and maintain a trough level of 15-20.     Driving restrictions    Complete by:  As directed   No driving for 4 weeks     Partial weight bearing    Complete by:  As directed   % Body Weight:  50  Laterality:  right  Extremity:  Lower     TED hose    Complete by:  As directed   Use stockings (TED hose) for 2 weeks on both leg(s).  You may remove them at night for sleeping.             Medication List    STOP taking these medications        GERITOL COMPLETE PO     HYDROcodone-acetaminophen 5-325 MG per tablet  Commonly known as:  NORCO/VICODIN     traMADol 50 MG tablet  Commonly known as:  ULTRAM     TYLENOL ARTHRITIS PAIN 650 MG CR tablet  Generic drug:  acetaminophen      TAKE these medications        alendronate 70 MG tablet    Commonly known as:  FOSAMAX  Take 1 tablet (70 mg total) by mouth once a week. Take with a full glass of water on an empty stomach, remain upright     ALPRAZolam 0.5 MG tablet  Commonly known as:  XANAX  Take 1 tablet (0.5 mg total) by mouth 2 (two) times daily.     apixaban 5 MG Tabs tablet  Commonly known as:  ELIQUIS  Take 1 tablet (5 mg total)  by mouth 2 (two) times daily.     cefTRIAXone 40 MG/ML IVPB  Commonly known as:  ROCEPHIN  Inject 50 mLs (2 g total) into the vein daily.     diphenhydrAMINE 25 MG tablet  Commonly known as:  BENADRYL  Take 75 mg by mouth 2 (two) times daily as needed for allergies (eye allergies).     DSS 100 MG Caps  Take 100 mg by mouth 2 (two) times daily as needed for mild constipation.     enalapril 10 MG tablet  Commonly known as:  VASOTEC  Take 10 mg by mouth 2 (two) times daily.     ferrous sulfate 325 (65 FE) MG tablet  Take 1 tablet (325 mg total) by mouth 3 (three) times daily after meals.     Fish Oil 1000 MG Caps  Take 3 capsules by mouth daily. Per Cardiologist     hydrochlorothiazide 12.5 MG capsule  Commonly known as:  MICROZIDE  Take 12.5 mg by mouth every morning.     loperamide 2 MG capsule  Commonly known as:  IMODIUM  Take 1 capsule (2 mg total) by mouth as needed for diarrhea or loose stools.     metoprolol tartrate 25 MG tablet  Commonly known as:  LOPRESSOR  Take 1 tablet (25 mg total) by mouth 2 (two) times daily.     nitroGLYCERIN 0.4 MG SL tablet  Commonly known as:  NITROSTAT  Place 1 tablet (0.4 mg total) under the tongue every 5 (five) minutes as needed for chest pain.     omeprazole 20 MG capsule  Commonly known as:  PRILOSEC  Take 1 capsule (20 mg total) by mouth daily.     oxyCODONE 5 MG immediate release tablet  Commonly known as:  Oxy IR/ROXICODONE  Take 1-3 tablets (5-15 mg total) by mouth every 4 (four) hours as needed for moderate pain or severe pain.     polyethylene glycol packet  Commonly  known as:  MIRALAX / GLYCOLAX  Take 17 g by mouth daily as needed for moderate constipation (couple times weekly, depending).     REFRESH OP  Apply 1 drop to eye 3 (three) times daily as needed (dry eyes).     rOPINIRole 0.25 MG tablet  Commonly known as:  REQUIP  Take 1 tablet (0.25 mg total) by mouth 3 (three) times daily.     simvastatin 40 MG tablet  Commonly known as:  ZOCOR  Take 1 tablet (40 mg total) by mouth daily at 6 PM.     tiZANidine 4 MG tablet  Commonly known as:  ZANAFLEX  Take 1 tablet (4 mg total) by mouth every 6 (six) hours as needed for muscle spasms.     traZODone 50 MG tablet  Commonly known as:  DESYREL  Take 1 tablet (50 mg total) by mouth at bedtime.     vancomycin 1 GM/200ML Soln  Commonly known as:  VANCOCIN  Inject 200 mLs (1,000 mg total) into the vein daily.     vitamin B-12 100 MCG tablet  Commonly known as:  CYANOCOBALAMIN  Take 2,000 mcg by mouth 2 (two) times daily.     Vitamin D-3 1000 UNITS Caps  Take 2,000 Units by mouth 2 (two) times daily.         Signed: West Pugh. Karene Bracken   PA-C  07/05/2014, 11:22 AM

## 2014-07-05 NOTE — Progress Notes (Signed)
     Subjective: 3 Days Post-Op Procedure(s) (LRB): Resection of Infected Right Total Knee Arthroplasty with placement antibiotic spacer (Right)   Patient reports pain as mild, pain controlled.  Feels much better after receiving 2 units of blood yesterday.  She is also less nauseated and no longer having loose stools. She states that she slept through the night without difficulties.  Feels ready to be discharged to SNF.  Objective:   VITALS:   Filed Vitals:   07/05/14 0516  BP: 91/50  Pulse: 59  Temp: 98.6 F (37 C)  Resp: 16    Dorsiflexion/Plantar flexion intact Incision: dressing C/D/I No cellulitis present Compartment soft  LABS  Recent Labs  07/03/14 0425 07/04/14 0425 07/05/14 0430  HGB 8.3* 7.5* 10.0*  HCT 25.0* 22.2* 30.4*  WBC 7.1 7.9 10.4  PLT 164 151 147*     Recent Labs  07/03/14 0425 07/04/14 0425 07/05/14 0430  NA 138 136* 136*  K 4.4 4.5 4.4  BUN 25* 37* 33*  CREATININE 1.12* 1.40* 1.26*  GLUCOSE 143* 141* 97     Assessment/Plan: 3 Days Post-Op Procedure(s) (LRB): Resection of Infected Right Total Knee Arthroplasty with placement antibiotic spacer (Right) Up with therapy Discharge to SNF  Follow up in 2 weeks at [facility] Follow up with OLIN,Kamarrion Stfort D in 2 weeks.  Contact information:  [facility] [address], [place] [address] [id number]    ABLA Received 2 units of blood yesterday, feels better today. HGB increased to 10.0 from 7.5 Continue Iron Creatinine and BUN are also more normal after receiving blood       West Pugh. Ichiro Chesnut   PAC  07/05/2014, 7:51 AM

## 2014-07-05 NOTE — Plan of Care (Signed)
Problem: Discharge Progression Outcomes Goal: Barriers To Progression Addressed/Resolved Outcome: Completed/Met Date Met:  07/05/14 Goal: CMS/Neurovascular status at or above baseline Outcome: Completed/Met Date Met:  07/05/14 Goal: Anticoagulant follow-up in place Outcome: Completed/Met Date Met:  07/05/14 Goal: Pain controlled with appropriate interventions Outcome: Completed/Met Date Met:  07/05/14 Goal: Hemodynamically stable Outcome: Completed/Met Date Met:  15/83/09 Goal: Complications resolved/controlled Outcome: Completed/Met Date Met:  07/05/14 Goal: Tolerates diet Outcome: Completed/Met Date Met:  07/05/14 Goal: Activity appropriate for discharge plan Outcome: Completed/Met Date Met:  07/05/14 Goal: Ambulates safely using assistive device Outcome: Progressing Goal: Follows weight - bearing limitations Outcome: Completed/Met Date Met:  07/05/14 Goal: Discharge plan in place and appropriate Outcome: Completed/Met Date Met:  07/05/14 Goal: Negotiates stairs Outcome: Completed/Met Date Met:  07/05/14 Goal: Demonstrates ADLs as appropriate Outcome: Completed/Met Date Met:  07/05/14 Goal: Incision without S/S infection Outcome: Completed/Met Date Met:  07/05/14

## 2014-07-06 LAB — BODY FLUID CULTURE: Culture: NO GROWTH

## 2014-07-07 ENCOUNTER — Other Ambulatory Visit: Payer: Self-pay | Admitting: Family Medicine

## 2014-07-07 LAB — ANAEROBIC CULTURE

## 2014-07-27 DIAGNOSIS — R768 Other specified abnormal immunological findings in serum: Secondary | ICD-10-CM

## 2014-07-27 HISTORY — DX: Other specified abnormal immunological findings in serum: R76.8

## 2014-08-08 ENCOUNTER — Ambulatory Visit: Payer: Self-pay | Admitting: Family Medicine

## 2014-08-08 ENCOUNTER — Encounter: Payer: Self-pay | Admitting: Family Medicine

## 2014-08-08 ENCOUNTER — Inpatient Hospital Stay: Payer: Medicare Other | Admitting: Infectious Diseases

## 2014-08-08 LAB — CBC
BUN: 20 mg/dL (ref 4–21)
CRP: 3.3 mg/dL
Creat, Fluid: 1.48
HGB: 9.6 g/dL
SED RATE: 42
Vancomycin Tr: 19.7
WBC: 5

## 2014-08-09 ENCOUNTER — Telehealth: Payer: Self-pay | Admitting: Licensed Clinical Social Worker

## 2014-08-09 NOTE — Telephone Encounter (Signed)
Linn called from [facility] to report that CR is now 1.23 and BUN 17 they will resume 1 gram Vanc every 48 hours and will do labs again on 08/12/14. Her end date is 08/14/14

## 2014-08-10 ENCOUNTER — Encounter: Payer: Self-pay | Admitting: Family Medicine

## 2014-08-10 ENCOUNTER — Ambulatory Visit (INDEPENDENT_AMBULATORY_CARE_PROVIDER_SITE_OTHER): Payer: PPO | Admitting: Family Medicine

## 2014-08-10 VITALS — BP 144/63 | HR 66 | Temp 98.2°F | Ht 61.0 in | Wt 144.0 lb

## 2014-08-10 DIAGNOSIS — R21 Rash and other nonspecific skin eruption: Secondary | ICD-10-CM | POA: Insufficient documentation

## 2014-08-10 DIAGNOSIS — R109 Unspecified abdominal pain: Secondary | ICD-10-CM

## 2014-08-10 DIAGNOSIS — L299 Pruritus, unspecified: Secondary | ICD-10-CM

## 2014-08-10 DIAGNOSIS — G8918 Other acute postprocedural pain: Secondary | ICD-10-CM

## 2014-08-10 MED ORDER — OXYCODONE HCL 5 MG PO TABS: 5.0000 mg | ORAL_TABLET | Freq: Four times a day (QID) | ORAL | Status: DC | PRN

## 2014-08-10 MED ORDER — TIZANIDINE HCL 4 MG PO TABS: 4.0000 mg | ORAL_TABLET | Freq: Every day | ORAL | Status: DC

## 2014-08-10 NOTE — Progress Notes (Signed)
   Subjective:    Patient ID: Jacqueline [name], female    DOB: 1930/09/18, 81 y.o.   MRN: [id number]  HPI Ms Gartrell has been through a very difficult time - and it is not over yet.  She had right knee replacement surgery in 2006.  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  She underwent surgical removal of that hardware.  She has been DCed from SNF rehab and is now capable of doing all her ADLs and IADLs.  She remains on both vancomycin and rocephin with a stop date of 08/14/14.  She and her orthopedist plan to redo her knee replacement surgery in 09/2014.  Recently her vanc dose was decreased due to a modest bump in creat and high trough level.  Her issues today are: 1. Stomach cramping which is periumbilical and lower abd which seems to coincide with the vancomicn infusion.  She denies diarrhea, fever or blood.  By evening, she is back to normal and sleeps without cramping.  She has lost 9 lbs from her pre surgical weight.  Appetite is good. 2. Itching - again coincides with the vanc infusion.  No rash or throat swelling.  She controls it some with benadryl 3. Pain control is sub optimal.  Taking oxy IR.    Review of Systems     Objective:   Physical Exam Gen normal appearing. Abd benign without organomegally, masses or tenderness. Ext. Rt knee incision well healed and non red.        Assessment & Plan:

## 2014-08-10 NOTE — Assessment & Plan Note (Signed)
At risk for C diff but no diarrhea.  Seems brought on by vanc infusion.  Given that she only has four more days of antibiotics, will see again next week to determine if symptoms resolve or remain.  No evidence of diverticulitis.

## 2014-08-10 NOTE — Assessment & Plan Note (Signed)
Add regular dosing of tylenol to her oxy IR.  Recheck in one week.  Consider adding neuropathic pain med if still poor control.

## 2014-08-10 NOTE — Assessment & Plan Note (Signed)
Likely mild, non systemic reaction to vanc.  Seems brought on by vanc infusion.  Given that she only has four more days of antibiotics, will see again next week to determine if symptoms resolve or remain.

## 2014-08-10 NOTE — Patient Instructions (Addendum)
No change for now.  I want you to call me next Thursday and tell me if you are still having the itching and stomach cramps.  I may need to do more testing if the symptoms continue despite stopping the antibiotics.   Try Emmaline Kluver or regular yogurt as a probiotic to get good bacteria back in your intestines.  Call me Monday if you can't stomach the yogurt and I will prescribe probiotic pills I will refill your plain oxycodone for pain. I want you to regularly take 4 of the 650 mg tylenol every day. See me in one week and we will make more adjustment. I sent the muscle relaxer prescription right to the pharmacy.

## 2014-08-17 ENCOUNTER — Ambulatory Visit: Payer: Self-pay | Admitting: Family Medicine

## 2014-08-29 NOTE — H&P (Signed)
Jacqueline [name] is an 81 y.o. female.    Chief Complaint:    S/P resection of right TKA and placement of antibiotic spacer do to infection   Procedure:   Reimplantation of right TKA, with removal of antibiotic spacer  HPI: Pt is a 81 y.o. female status post resection of right TKA and placement of antibiotic spacer do to infection.  Her preclinical workup back in 2015 included aspiration that revealed elevated white blood cells within the synovium, synovial fluid, and elevated C-reactive protein, and sedimentation rate. She also had abone scan ordered, which was positive for increased uptake concerning for loosening or infection. Given the clinical picture present, infection was the diagnosis.Dr. Alvan Dame did a resection of the TKA and placed an antibiotic spacer. She subsequent was on IV antibiotic for 6 weeks. Discussion was had with the patient and family regarding proceeding with a reimplantation of the TKA.  They also understand that if everything doesn't look as it should at the time of surgery that she may receive another antibiotic spacer with another course of antibiotics.  Risks, benefits and expectations were discussed with the patient.  Risks including but not limited to the risk of anesthesia, blood clots, nerve damage, blood vessel damage, failure of the prosthesis, infection and up to and including death.  Patient understand the risks, benefits and expectations and wishes to proceed with surgery.   PCP: Zigmund Gottron, MD  D/C Plans:      Home with HHPT/SNF  (previously went to [facility])  Post-op Meds:       No Rx given   Tranexamic Acid:      To be given -  Topically  (CAD)  Decadron:      Is to be given  FYI:     Eliquis post-op  Norco post-op   PMH: Past Medical History  Diagnosis Date  . Hypertension   . CAD (coronary artery disease)   . History of stress test     Normal perfusion study which shows normal perfusion throughout and an EF 77%  . Hx of  echocardiogram     Showed mild left ventricular hypertrophy, perserved left ventricular systolic function, evidence of diastolic dysfunction, and a severly dilated left atrium, but minimal valvular abnormalities.  . Hyperlipemia   . Insomnia   . Diverticulitis 02/21/2012  . GERD (gastroesophageal reflux disease)   . Atrial fibrillation with rapid ventricular response 02/2014  . Arthritis     back    PSH: Past Surgical History  Procedure Laterality Date  . Coronary artery bypass graft  2006    off-pump bypass surgery with LIMA to the LAD and SVG to the second diagonal artery ( performed by Dr Servando Snare)   . Tee without cardioversion N/A 02/28/2014    Procedure: TRANSESOPHAGEAL ECHOCARDIOGRAM (TEE);  Surgeon: Sanda Klein, MD;  Location: [facility] ENDOSCOPY;  Service: Cardiovascular;  Laterality: N/A;  . Cardioversion N/A 02/28/2014    Procedure: CARDIOVERSION;  Surgeon: Sanda Klein, MD;  Location: [facility] ENDOSCOPY;  Service: Cardiovascular;  Laterality: N/A;  . Cataract extraction Bilateral 2014  . Abdominal hysterectomy  1975  . Rotator cuff repair Bilateral     r-1999, l- 2005  . Joint replacement Bilateral     L-2004, R-2006  . Total knee arthroplasty Right 07/02/2014    Procedure: Resection of Infected Right Total Knee Arthroplasty with placement antibiotic spacer;  Surgeon: Mauri Pole, MD;  Location: [facility] ORS;  Service: Orthopedics;  Laterality: Right;    Social History:  reports that she has never smoked. She has never used smokeless tobacco. She reports that she does not drink alcohol or use illicit drugs.  Allergies:  Allergies  Allergen Reactions  . Atorvastatin Other (See Comments)    Myalgias in legs  . Crestor [Rosuvastatin] Other (See Comments)    Myalgias in legs  . Morphine And Related Other (See Comments)    "Crazy thoughts, severe headache, sick"    Medications: No current facility-administered medications for this encounter.   Current Outpatient Prescriptions    Medication Sig Dispense Refill  . alendronate (FOSAMAX) 70 MG tablet Take 1 tablet (70 mg total) by mouth once a week. Take with a full glass of water on an empty stomach, remain upright (Patient taking differently: Take 70 mg by mouth once a week. Saturday) 12 tablet 3  . ALPRAZolam (XANAX) 0.5 MG tablet Take 1 tablet (0.5 mg total) by mouth 2 (two) times daily. 180 tablet 3  . apixaban (ELIQUIS) 5 MG TABS tablet Take 1 tablet (5 mg total) by mouth 2 (two) times daily. 180 tablet 3  . cefTRIAXone (ROCEPHIN) 40 MG/ML IVPB Inject 50 mLs (2 g total) into the vein daily. 2100 mL 0  . Cholecalciferol (VITAMIN D-3) 1000 UNITS CAPS Take 2,000 Units by mouth 2 (two) times daily.    . diphenhydrAMINE (BENADRYL) 25 MG tablet Take 75 mg by mouth 2 (two) times daily as needed for allergies (eye allergies).     . docusate sodium 100 MG CAPS Take 100 mg by mouth 2 (two) times daily as needed for mild constipation. 10 capsule 0  . enalapril (VASOTEC) 10 MG tablet TAKE 1 BY MOUTH TWICE DAILY 180 tablet 3  . ferrous sulfate 325 (65 FE) MG tablet Take 1 tablet (325 mg total) by mouth 3 (three) times daily after meals.  3  . hydrochlorothiazide (MICROZIDE) 12.5 MG capsule Take 12.5 mg by mouth every morning.    . loperamide (IMODIUM) 2 MG capsule Take 1 capsule (2 mg total) by mouth as needed for diarrhea or loose stools. 30 capsule 0  . metoprolol tartrate (LOPRESSOR) 25 MG tablet Take 1 tablet (25 mg total) by mouth 2 (two) times daily. 180 tablet 3  . nitroGLYCERIN (NITROSTAT) 0.4 MG SL tablet Place 1 tablet (0.4 mg total) under the tongue every 5 (five) minutes as needed for chest pain. 25 tablet 2  . Omega-3 Fatty Acids (FISH OIL) 1000 MG CAPS Take 3 capsules by mouth daily. Per Cardiologist    . omeprazole (PRILOSEC) 20 MG capsule Take 1 capsule (20 mg total) by mouth daily. (Patient taking differently: Take 20 mg by mouth every morning. ) 90 capsule 3  . oxyCODONE (OXY IR/ROXICODONE) 5 MG immediate release  tablet Take 1 tablet (5 mg total) by mouth every 6 (six) hours as needed for moderate pain or severe pain. 90 tablet 0  . polyethylene glycol (MIRALAX / GLYCOLAX) packet Take 17 g by mouth daily as needed for moderate constipation (couple times weekly, depending).    . Polyvinyl Alcohol-Povidone (REFRESH OP) Apply 1 drop to eye 3 (three) times daily as needed (dry eyes).    Marland Kitchen rOPINIRole (REQUIP) 0.25 MG tablet Take 1 tablet (0.25 mg total) by mouth 3 (three) times daily. 270 tablet 3  . simvastatin (ZOCOR) 40 MG tablet Take 1 tablet (40 mg total) by mouth daily at 6 PM. (Patient taking differently: Take 40 mg by mouth at bedtime. ) 90 tablet 3  . tiZANidine (ZANAFLEX) 4 MG tablet  Take 1 tablet (4 mg total) by mouth at bedtime. 30 tablet 4  . traZODone (DESYREL) 50 MG tablet Take 1 tablet (50 mg total) by mouth at bedtime. 90 tablet 3  . vancomycin (VANCOCIN) 1 GM/200ML SOLN Inject 200 mLs (1,000 mg total) into the vein daily. 8400 mL 0  . vitamin B-12 (CYANOCOBALAMIN) 100 MCG tablet Take 2,000 mcg by mouth 2 (two) times daily.        Review of Systems  Constitutional: Negative.   HENT: Negative.   Eyes: Negative.   Respiratory: Negative.   Cardiovascular: Negative.   Gastrointestinal: Positive for heartburn.  Genitourinary: Negative.   Musculoskeletal: Positive for back pain and joint pain.  Skin: Negative.   Neurological: Negative.   Endo/Heme/Allergies: Negative.   Psychiatric/Behavioral: The patient has insomnia.       Physical Exam  Constitutional: She is oriented to person, place, and time. She appears well-developed and well-nourished.  HENT:  Head: Normocephalic and atraumatic.  Eyes: Pupils are equal, round, and reactive to light.  Neck: Neck supple. No JVD present. No tracheal deviation present. No thyromegaly present.  Cardiovascular: Normal rate, regular rhythm, normal heart sounds and intact distal pulses.   Respiratory: Effort normal and breath sounds normal. No  respiratory distress. She has no wheezes.  GI: Soft. There is no tenderness. There is no guarding.  Musculoskeletal:       Right knee: She exhibits decreased range of motion, laceration (previous incision healed well) and bony tenderness. She exhibits no effusion, no ecchymosis, no deformity and no erythema. Tenderness found.  Lymphadenopathy:    She has no cervical adenopathy.  Neurological: She is alert and oriented to person, place, and time.  Skin: Skin is warm and dry.  Psychiatric: She has a normal mood and affect.        Assessment/Plan Assessment:    S/P resection of right TKA and placement of antibiotic spacer do to infection   Plan: Patient will undergo a reimplantation of right TKA, with removal of antibiotic spacer on 09/10/2014 per Dr. Alvan Dame at [facility]. Risks benefits and expectations were discussed with the patient. Patient understand risks, benefits and expectations and wishes to proceed.     West Pugh Rieley Khalsa   PA-C  08/29/2014, 1:51 PM

## 2014-09-06 ENCOUNTER — Inpatient Hospital Stay: Payer: PPO | Admitting: Internal Medicine

## 2014-09-14 ENCOUNTER — Other Ambulatory Visit: Payer: Self-pay | Admitting: Family Medicine

## 2014-09-15 ENCOUNTER — Emergency Department (HOSPITAL_COMMUNITY): Payer: PPO

## 2014-09-15 ENCOUNTER — Encounter (HOSPITAL_COMMUNITY): Payer: Self-pay | Admitting: Emergency Medicine

## 2014-09-15 ENCOUNTER — Inpatient Hospital Stay (HOSPITAL_COMMUNITY)
Admission: EM | Admit: 2014-09-15 | Discharge: 2014-09-18 | DRG: 287 | Disposition: A | Discharge status: Home / Self Care | Payer: PPO | Attending: Cardiology | Admitting: Cardiology

## 2014-09-15 DIAGNOSIS — I200 Unstable angina: Secondary | ICD-10-CM

## 2014-09-15 DIAGNOSIS — Z888 Allergy status to other drugs, medicaments and biological substances status: Secondary | ICD-10-CM

## 2014-09-15 DIAGNOSIS — N1832 Chronic kidney disease, stage 3b: Secondary | ICD-10-CM | POA: Diagnosis present

## 2014-09-15 DIAGNOSIS — N182 Chronic kidney disease, stage 2 (mild): Secondary | ICD-10-CM | POA: Diagnosis present

## 2014-09-15 DIAGNOSIS — E876 Hypokalemia: Secondary | ICD-10-CM | POA: Diagnosis present

## 2014-09-15 DIAGNOSIS — Z951 Presence of aortocoronary bypass graft: Secondary | ICD-10-CM | POA: Diagnosis not present

## 2014-09-15 DIAGNOSIS — R079 Chest pain, unspecified: Secondary | ICD-10-CM | POA: Diagnosis present

## 2014-09-15 DIAGNOSIS — E780 Pure hypercholesterolemia: Secondary | ICD-10-CM | POA: Diagnosis present

## 2014-09-15 DIAGNOSIS — R109 Unspecified abdominal pain: Secondary | ICD-10-CM | POA: Diagnosis present

## 2014-09-15 DIAGNOSIS — I480 Paroxysmal atrial fibrillation: Secondary | ICD-10-CM | POA: Diagnosis present

## 2014-09-15 DIAGNOSIS — I43 Cardiomyopathy in diseases classified elsewhere: Secondary | ICD-10-CM | POA: Diagnosis present

## 2014-09-15 DIAGNOSIS — E785 Hyperlipidemia, unspecified: Secondary | ICD-10-CM | POA: Diagnosis present

## 2014-09-15 DIAGNOSIS — N184 Chronic kidney disease, stage 4 (severe): Secondary | ICD-10-CM | POA: Diagnosis present

## 2014-09-15 DIAGNOSIS — Z7901 Long term (current) use of anticoagulants: Secondary | ICD-10-CM

## 2014-09-15 DIAGNOSIS — I25110 Atherosclerotic heart disease of native coronary artery with unstable angina pectoris: Principal | ICD-10-CM

## 2014-09-15 DIAGNOSIS — I129 Hypertensive chronic kidney disease with stage 1 through stage 4 chronic kidney disease, or unspecified chronic kidney disease: Secondary | ICD-10-CM | POA: Diagnosis present

## 2014-09-15 DIAGNOSIS — K219 Gastro-esophageal reflux disease without esophagitis: Secondary | ICD-10-CM | POA: Diagnosis present

## 2014-09-15 DIAGNOSIS — I10 Essential (primary) hypertension: Secondary | ICD-10-CM

## 2014-09-15 DIAGNOSIS — Z885 Allergy status to narcotic agent status: Secondary | ICD-10-CM

## 2014-09-15 DIAGNOSIS — I119 Hypertensive heart disease without heart failure: Secondary | ICD-10-CM | POA: Diagnosis present

## 2014-09-15 DIAGNOSIS — Z89529 Acquired absence of unspecified knee: Secondary | ICD-10-CM

## 2014-09-15 LAB — COMPREHENSIVE METABOLIC PANEL
ALBUMIN: 3.4 g/dL — AB (ref 3.5–5.2)
ALT: 20 U/L (ref 0–35)
ANION GAP: 9 (ref 5–15)
AST: 25 U/L (ref 0–37)
Alkaline Phosphatase: 50 U/L (ref 39–117)
BILIRUBIN TOTAL: 0.3 mg/dL (ref 0.3–1.2)
BUN: 16 mg/dL (ref 6–23)
CO2: 22 mmol/L (ref 19–32)
CREATININE: 1.28 mg/dL — AB (ref 0.50–1.10)
Calcium: 8.8 mg/dL (ref 8.4–10.5)
Chloride: 106 mmol/L (ref 96–112)
GFR calc non Af Amer: 38 mL/min — ABNORMAL LOW (ref >90)
GFR, EST AFRICAN AMERICAN: 44 mL/min — AB (ref >90)
GLUCOSE: 90 mg/dL (ref 70–99)
POTASSIUM: 3.2 mmol/L — AB (ref 3.5–5.1)
Sodium: 137 mmol/L (ref 135–145)
Total Protein: 5.9 g/dL — ABNORMAL LOW (ref 6.0–8.3)

## 2014-09-15 LAB — PROTIME-INR
INR: 1.31 (ref 0.00–1.49)
Prothrombin Time: 16.4 seconds — ABNORMAL HIGH (ref 11.6–15.2)

## 2014-09-15 LAB — HEPATIC FUNCTION PANEL
ALT: 21 U/L (ref 0–35)
AST: 26 U/L (ref 0–37)
Albumin: 3.6 g/dL (ref 3.5–5.2)
Alkaline Phosphatase: 51 U/L (ref 39–117)
Bilirubin, Direct: 0.1 mg/dL (ref 0.0–0.5)
Indirect Bilirubin: 0.4 mg/dL (ref 0.3–0.9)
TOTAL PROTEIN: 6.5 g/dL (ref 6.0–8.3)
Total Bilirubin: 0.5 mg/dL (ref 0.3–1.2)

## 2014-09-15 LAB — I-STAT TROPONIN, ED
TROPONIN I, POC: 0.03 ng/mL (ref 0.00–0.08)
Troponin i, poc: 0.02 ng/mL (ref 0.00–0.08)

## 2014-09-15 LAB — CBC WITH DIFFERENTIAL/PLATELET
BASOS ABS: 0 10*3/uL (ref 0.0–0.1)
Basophils Relative: 1 % (ref 0–1)
EOS ABS: 0.2 10*3/uL (ref 0.0–0.7)
Eosinophils Relative: 4 % (ref 0–5)
HEMATOCRIT: 33.1 % — AB (ref 36.0–46.0)
Hemoglobin: 10.9 g/dL — ABNORMAL LOW (ref 12.0–15.0)
Lymphocytes Relative: 34 % (ref 12–46)
Lymphs Abs: 1.5 10*3/uL (ref 0.7–4.0)
MCH: 29.9 pg (ref 26.0–34.0)
MCHC: 32.9 g/dL (ref 30.0–36.0)
MCV: 90.9 fL (ref 78.0–100.0)
MONOS PCT: 8 % (ref 3–12)
Monocytes Absolute: 0.3 10*3/uL (ref 0.1–1.0)
Neutro Abs: 2.3 10*3/uL (ref 1.7–7.7)
Neutrophils Relative %: 53 % (ref 43–77)
PLATELETS: 162 10*3/uL (ref 150–400)
RBC: 3.64 MIL/uL — ABNORMAL LOW (ref 3.87–5.11)
RDW: 13.5 % (ref 11.5–15.5)
WBC: 4.3 10*3/uL (ref 4.0–10.5)

## 2014-09-15 LAB — TROPONIN I
TROPONIN I: 0.08 ng/mL — AB (ref <0.031)
Troponin I: 0.06 ng/mL — ABNORMAL HIGH (ref <0.031)

## 2014-09-15 LAB — BASIC METABOLIC PANEL
Anion gap: 10 (ref 5–15)
BUN: 20 mg/dL (ref 6–23)
CO2: 21 mmol/L (ref 19–32)
Calcium: 9.5 mg/dL (ref 8.4–10.5)
Chloride: 105 mmol/L (ref 96–112)
Creatinine, Ser: 1.38 mg/dL — ABNORMAL HIGH (ref 0.50–1.10)
GFR calc non Af Amer: 35 mL/min — ABNORMAL LOW (ref >90)
GFR, EST AFRICAN AMERICAN: 40 mL/min — AB (ref >90)
Glucose, Bld: 124 mg/dL — ABNORMAL HIGH (ref 70–99)
Potassium: 3.2 mmol/L — ABNORMAL LOW (ref 3.5–5.1)
Sodium: 136 mmol/L (ref 135–145)

## 2014-09-15 LAB — MRSA PCR SCREENING: MRSA BY PCR: NEGATIVE

## 2014-09-15 LAB — HEPARIN LEVEL (UNFRACTIONATED): HEPARIN UNFRACTIONATED: 1.7 [IU]/mL — AB (ref 0.30–0.70)

## 2014-09-15 LAB — LIPASE, BLOOD: Lipase: 27 U/L (ref 11–59)

## 2014-09-15 LAB — TSH: TSH: 2.946 u[IU]/mL (ref 0.350–4.500)

## 2014-09-15 LAB — CBC
HEMATOCRIT: 34.2 % — AB (ref 36.0–46.0)
HEMOGLOBIN: 11.4 g/dL — AB (ref 12.0–15.0)
MCH: 30.8 pg (ref 26.0–34.0)
MCHC: 33.3 g/dL (ref 30.0–36.0)
MCV: 92.4 fL (ref 78.0–100.0)
Platelets: 163 10*3/uL (ref 150–400)
RBC: 3.7 MIL/uL — ABNORMAL LOW (ref 3.87–5.11)
RDW: 13.5 % (ref 11.5–15.5)
WBC: 4.9 10*3/uL (ref 4.0–10.5)

## 2014-09-15 LAB — MAGNESIUM: MAGNESIUM: 1.6 mg/dL (ref 1.5–2.5)

## 2014-09-15 LAB — APTT
aPTT: 41 seconds — ABNORMAL HIGH (ref 24–37)
aPTT: 41 seconds — ABNORMAL HIGH (ref 24–37)

## 2014-09-15 LAB — AMYLASE: Amylase: 42 U/L (ref 0–105)

## 2014-09-15 MED ORDER — SIMVASTATIN 40 MG PO TABS
40.0000 mg | ORAL_TABLET | Freq: Every day | ORAL | Status: DC
  Administered 2014-09-15: 40 mg via ORAL
  Filled 2014-09-15 (×2): qty 1

## 2014-09-15 MED ORDER — POLYETHYLENE GLYCOL 3350 17 G PO PACK
17.0000 g | PACK | Freq: Every day | ORAL | Status: DC | PRN
  Filled 2014-09-15: qty 1

## 2014-09-15 MED ORDER — SODIUM CHLORIDE 0.9 % IV BOLUS (SEPSIS)
1000.0000 mL | Freq: Once | INTRAVENOUS | Status: AC
  Administered 2014-09-15: 1000 mL via INTRAVENOUS

## 2014-09-15 MED ORDER — FERROUS SULFATE 325 (65 FE) MG PO TABS
325.0000 mg | ORAL_TABLET | Freq: Three times a day (TID) | ORAL | Status: DC
  Administered 2014-09-15 – 2014-09-18 (×8): 325 mg via ORAL
  Filled 2014-09-15 (×12): qty 1

## 2014-09-15 MED ORDER — HEPARIN (PORCINE) IN NACL 100-0.45 UNIT/ML-% IJ SOLN
650.0000 [IU]/h | INTRAMUSCULAR | Status: DC
  Administered 2014-09-15: 900 [IU]/h via INTRAVENOUS
  Administered 2014-09-17: 650 [IU]/h via INTRAVENOUS
  Filled 2014-09-15 (×4): qty 250

## 2014-09-15 MED ORDER — ALPRAZOLAM 0.5 MG PO TABS
0.5000 mg | ORAL_TABLET | Freq: Two times a day (BID) | ORAL | Status: DC
  Administered 2014-09-15 – 2014-09-18 (×6): 0.5 mg via ORAL
  Filled 2014-09-15 (×6): qty 1

## 2014-09-15 MED ORDER — POTASSIUM CHLORIDE CRYS ER 20 MEQ PO TBCR
40.0000 meq | EXTENDED_RELEASE_TABLET | Freq: Once | ORAL | Status: AC
  Administered 2014-09-15: 40 meq via ORAL
  Filled 2014-09-15: qty 2

## 2014-09-15 MED ORDER — ASPIRIN EC 81 MG PO TBEC
81.0000 mg | DELAYED_RELEASE_TABLET | Freq: Every day | ORAL | Status: DC
  Administered 2014-09-16: 81 mg via ORAL
  Filled 2014-09-15 (×2): qty 1

## 2014-09-15 MED ORDER — FENTANYL CITRATE 0.05 MG/ML IJ SOLN
25.0000 ug | Freq: Once | INTRAMUSCULAR | Status: AC
  Administered 2014-09-15: 25 ug via INTRAVENOUS
  Filled 2014-09-15: qty 2

## 2014-09-15 MED ORDER — ROPINIROLE HCL 0.25 MG PO TABS
0.2500 mg | ORAL_TABLET | Freq: Three times a day (TID) | ORAL | Status: DC
  Administered 2014-09-15 – 2014-09-18 (×8): 0.25 mg via ORAL
  Filled 2014-09-15 (×11): qty 1

## 2014-09-15 MED ORDER — ENALAPRIL MALEATE 10 MG PO TABS
10.0000 mg | ORAL_TABLET | Freq: Two times a day (BID) | ORAL | Status: DC
  Administered 2014-09-15 – 2014-09-16 (×2): 10 mg via ORAL
  Filled 2014-09-15 (×3): qty 1

## 2014-09-15 MED ORDER — NITROGLYCERIN 0.4 MG SL SUBL: 0.4000 mg | SUBLINGUAL_TABLET | SUBLINGUAL | Status: DC | PRN

## 2014-09-15 MED ORDER — TRAZODONE HCL 50 MG PO TABS
50.0000 mg | ORAL_TABLET | Freq: Every day | ORAL | Status: DC
  Administered 2014-09-15 – 2014-09-17 (×3): 50 mg via ORAL
  Filled 2014-09-15 (×4): qty 1

## 2014-09-15 MED ORDER — OXYCODONE HCL 5 MG PO TABS
5.0000 mg | ORAL_TABLET | Freq: Four times a day (QID) | ORAL | Status: DC | PRN
  Administered 2014-09-16: 5 mg via ORAL
  Filled 2014-09-15: qty 1

## 2014-09-15 MED ORDER — ACETAMINOPHEN 325 MG PO TABS: 650.0000 mg | ORAL_TABLET | ORAL | Status: DC | PRN

## 2014-09-15 MED ORDER — TIZANIDINE HCL 4 MG PO TABS
4.0000 mg | ORAL_TABLET | Freq: Every day | ORAL | Status: DC
  Administered 2014-09-15 – 2014-09-17 (×3): 4 mg via ORAL
  Filled 2014-09-15 (×4): qty 1

## 2014-09-15 MED ORDER — NITROGLYCERIN IN D5W 200-5 MCG/ML-% IV SOLN
3.0000 ug/min | INTRAVENOUS | Status: DC
Start: 2014-09-15 — End: 2014-09-15

## 2014-09-15 MED ORDER — PANTOPRAZOLE SODIUM 40 MG PO TBEC: 40.0000 mg | DELAYED_RELEASE_TABLET | Freq: Every day | ORAL | Status: DC

## 2014-09-15 MED ORDER — TRAMADOL HCL 50 MG PO TABS
50.0000 mg | ORAL_TABLET | Freq: Two times a day (BID) | ORAL | Status: DC | PRN
  Administered 2014-09-15 – 2014-09-16 (×2): 50 mg via ORAL
  Filled 2014-09-15 (×2): qty 1

## 2014-09-15 MED ORDER — ONDANSETRON HCL 4 MG/2ML IJ SOLN: 4.0000 mg | Freq: Four times a day (QID) | INTRAMUSCULAR | Status: DC | PRN

## 2014-09-15 MED ORDER — ASPIRIN 300 MG RE SUPP: 300.0000 mg | RECTAL | Status: AC

## 2014-09-15 MED ORDER — NITROGLYCERIN IN D5W 200-5 MCG/ML-% IV SOLN
10.0000 ug/min | Freq: Once | INTRAVENOUS | Status: AC
  Administered 2014-09-15: 10 ug/min via INTRAVENOUS
  Filled 2014-09-15: qty 250

## 2014-09-15 MED ORDER — ASPIRIN 81 MG PO CHEW
324.0000 mg | CHEWABLE_TABLET | ORAL | Status: AC
  Administered 2014-09-15: 324 mg via ORAL
  Filled 2014-09-15: qty 4

## 2014-09-15 MED ORDER — NITROGLYCERIN 0.4 MG SL SUBL
0.4000 mg | SUBLINGUAL_TABLET | SUBLINGUAL | Status: DC | PRN
  Administered 2014-09-15: 0.4 mg via SUBLINGUAL
  Filled 2014-09-15: qty 1

## 2014-09-15 MED ORDER — METOPROLOL TARTRATE 25 MG PO TABS
25.0000 mg | ORAL_TABLET | Freq: Two times a day (BID) | ORAL | Status: DC
  Administered 2014-09-15 – 2014-09-18 (×6): 25 mg via ORAL
  Filled 2014-09-15 (×6): qty 1

## 2014-09-15 MED ORDER — PANTOPRAZOLE SODIUM 40 MG PO TBEC
40.0000 mg | DELAYED_RELEASE_TABLET | Freq: Every day | ORAL | Status: DC
  Administered 2014-09-16 – 2014-09-18 (×3): 40 mg via ORAL
  Filled 2014-09-15 (×4): qty 1

## 2014-09-15 NOTE — H&P (Addendum)
Admit date: 09/15/2014 Referring Physician:  Dr. Jeanell Sparrow Primary Cardiologist:  Dr. [name] Chief complaint/reason for admission:Chest pain  HPI: Mrs. Neyens is 81 years old and is roughly 10 years status post coronary artery bypass surgery (LIMA to LAD and SVG to diagonal 2 by Dr. Servando Snare with atretic LIMA but good distal flow via the other conduits) and has normal left ventricular systolic function and a relatively recent normal nuclear perfusion study. Her last cath in 2006 showed an atretic LIMA with dual supply of the LAD from retrograde filling from the D2 SVG and left to left and right to left collaterals.  The D1 was very samll and had a 70% stenosis proximally.  The D2 has a 70% ostial stenosis and there is a 90% focal stenosis of the LAD at the takeoff of the D2.  The RCA and LCX were free of disease and SVG to D2 was patent.  She had highly symptomatic atrial fibrillation rapid ventricular response in August and underwent TEE-guided cardioversion followed by treatment with Eliquis. She does not have a history of stroke or TIA.   She was in her USOH until this am when she awakened this am and could not get comfortable.  She took 3 SL NTG which eased it some but she continued to feel uncomfortable and came to the ER.  She said that her heart was racing and her HR registered 132 bpm on her BP monitor.  She describes the chest discomfort as a pain and a thightness located midsternal with no radiation to the neck or arms.  She denies any nausea or diaphoresis. The pain is gone but the tightness persists and is a 4/10 in severity.  Nothing makes it worse or better.  She has also been SOB since she awakened this am.  This is the first palpitations and chest tightness she has had since she saw Dr. [name] in the fall.  She denies any dizziness, LE edema or syncopel.  PMH:    Past Medical History  Diagnosis Date  . Hypertension   . CAD (coronary artery disease)   . History of stress test     Normal  perfusion study which shows normal perfusion throughout and an EF 77%  . Hx of echocardiogram     Showed mild left ventricular hypertrophy, perserved left ventricular systolic function, evidence of diastolic dysfunction, and a severly dilated left atrium, but minimal valvular abnormalities.  . Hyperlipemia   . Insomnia   . Diverticulitis 02/21/2012  . GERD (gastroesophageal reflux disease)   . Atrial fibrillation with rapid ventricular response 02/2014  . Arthritis     back    PSH:    Past Surgical History  Procedure Laterality Date  . Coronary artery bypass graft  2006    off-pump bypass surgery with LIMA to the LAD and SVG to the second diagonal artery ( performed by Dr Servando Snare)   . Tee without cardioversion N/A 02/28/2014    Procedure: TRANSESOPHAGEAL ECHOCARDIOGRAM (TEE);  Surgeon: Sanda Klein, MD;  Location: [facility] ENDOSCOPY;  Service: Cardiovascular;  Laterality: N/A;  . Cardioversion N/A 02/28/2014    Procedure: CARDIOVERSION;  Surgeon: Sanda Klein, MD;  Location: [facility] ENDOSCOPY;  Service: Cardiovascular;  Laterality: N/A;  . Cataract extraction Bilateral 2014  . Abdominal hysterectomy  1975  . Rotator cuff repair Bilateral     r-1999, l- 2005  . Joint replacement Bilateral     L-2004, R-2006  . Total knee arthroplasty Right 07/02/2014    Procedure: Resection of  Infected Right Total Knee Arthroplasty with placement antibiotic spacer;  Surgeon: Mauri Pole, MD;  Location: [facility] ORS;  Service: Orthopedics;  Laterality: Right;    ALLERGIES:   Atorvastatin; Crestor; and Morphine and related  Prior to Admit Meds:   (Not in a hospital admission) Family HX:    Family History  Problem Relation Age of Onset  . Parkinson's disease Mother    Social HX:    History   Social History  . Marital Status: Widowed    Spouse Name: N/A  . Number of Children: N/A  . Years of Education: N/A   Occupational History  . Not on file.   Social History Main Topics  . Smoking status: Never  Smoker   . Smokeless tobacco: Never Used  . Alcohol Use: No  . Drug Use: No  . Sexual Activity: No   Other Topics Concern  . Not on file   Social History Narrative     ROS:  All 11 ROS were addressed and are negative except what is stated in the HPI  PHYSICAL EXAM Filed Vitals:   09/15/14 1453  BP: 139/85  Pulse: 66  Temp:   Resp: 12   General: Well developed, well nourished, in no acute distress Head: Eyes PERRLA, No xanthomas.   Normal cephalic and atramatic  Lungs:   Clear bilaterally to auscultation and percussion. Heart:   HRRR S1 S2 Pulses are 2+ & equal.            No carotid bruit. No JVD.  No abdominal bruits. No femoral bruits. Abdomen: Bowel sounds are positive, abdomen soft and non-tender without masses  Extremities:   No clubbing, cyanosis or edema.  DP +1 Neuro: Alert and oriented X 3. Psych:  Good affect, responds appropriately   Labs:   Lab Results  Component Value Date   WBC 4.9 09/15/2014   HGB 11.4* 09/15/2014   HCT 34.2* 09/15/2014   MCV 92.4 09/15/2014   PLT 163 09/15/2014    Recent Labs Lab 09/15/14 1252  NA 136  K 3.2*  CL 105  CO2 21  BUN 20  CREATININE 1.38*  CALCIUM 9.5  GLUCOSE 124*   Lab Results  Component Value Date   TROPONINI <0.30 02/28/2014   No results found for: PTT Lab Results  Component Value Date   INR 1.01 07/02/2014   INR 1.32 06/25/2014   INR 1.03 02/26/2014     Lab Results  Component Value Date   CHOL 175 03/14/2014   CHOL 161 03/08/2013   CHOL 140 03/04/2012   Lab Results  Component Value Date   HDL 45 03/14/2014   HDL 46 03/08/2013   HDL 42 03/04/2012   Lab Results  Component Value Date   LDLCALC 82 03/14/2014   LDLCALC 79 03/08/2013   LDLCALC 76 03/04/2012   Lab Results  Component Value Date   TRIG 240* 03/14/2014   TRIG 180* 03/08/2013   TRIG 111 03/04/2012   Lab Results  Component Value Date   CHOLHDL 3.9 03/14/2014   CHOLHDL 3.5 03/08/2013   CHOLHDL 3.3 03/04/2012   No  results found for: LDLDIRECT    Radiology:  No results found.  EKG: NSR with new T wave inversions in V1 and V2    ASSESSMENT:  1.  Unstable angina with continued chest pain 4/10.  EKG shows new T wave inversions in V1 and V2 which may indicate progression of LAD disease.  Initial troponin is normal.   2.  PAF maintaining NSR but had palpitations this am.  She has not had a reoccurrence of her PAF since her DCCV but says that it felt like she was in afib this am and her HR at one point was in the 130's.   3.  Hypokalemia 4.  ASCAD s/p CABG with LIMA to LAD and SVG to D2 with known atretic LIMA 5.  HTN - well controlled 6.  Dyslipidemia 7.  GERD 8.  Abdominal pain - this has occurred ever since her knee surgery when she was on antibiotics but she has not had any diarrhea.  She is tender in her LUQ and epigastic area.  I will check an amylase and lipase 11.  CDK stage II  PLAN:   1.  Admit to step down tele bed 2.  Cycle cardiac enzymes until they peak. 3.  2D echo to assess for RWMA's 4.  Hold Eliquis and start Heparin per pharmacy 5.  Continue ACE I/BB/statin 6.  Start IV NTG and titrate for CP 7.  Given new EKG changes and ongoing CP will plan left heart cath on Monday or sooner if her enzymes become positive in the setting of ongoing CP.   8.  ASA 81mg  daily.   9.  Replete potassium 10.  BMET in am 11.  Hold HCTZ  Sueanne Margarita, MD  09/15/2014  3:05 PM

## 2014-09-15 NOTE — ED Notes (Signed)
Pt reports that she woke up this morning with central cp which feels like a tightness. Pt reports that she took 3 nitro tablets this morning with no relief. Pt has hx of a-fib with cardioversion.

## 2014-09-15 NOTE — Progress Notes (Signed)
ANTICOAGULATION CONSULT NOTE - Initial Consult  Pharmacy Consult for Heparin Indication: chest pain/ACS and atrial fibrillation  Allergies  Allergen Reactions  . Atorvastatin Other (See Comments)    Myalgias in legs  . Crestor [Rosuvastatin] Other (See Comments)    Myalgias in legs  . Morphine And Related Other (See Comments)    "Crazy thoughts, severe headache, sick"    Patient Measurements:    Wt Readings from Last 3 Encounters:  08/10/14 144 lb (65.318 kg)  07/02/14 149 lb (67.586 kg)  05/09/14 153 lb 3.2 oz (69.491 kg)    Vital Signs: Temp: 98.1 F (36.7 C) (02/20 1227) Temp Source: Oral (02/20 1227) BP: 139/85 mmHg (02/20 1453) Pulse Rate: 66 (02/20 1453)  Labs:  Recent Labs  09/15/14 1252  HGB 11.4*  HCT 34.2*  PLT 163  CREATININE 1.38*    CrCl cannot be calculated (Unknown ideal weight.).   Medical History: Past Medical History  Diagnosis Date  . Hypertension   . CAD (coronary artery disease)   . History of stress test     Normal perfusion study which shows normal perfusion throughout and an EF 77%  . Hx of echocardiogram     Showed mild left ventricular hypertrophy, perserved left ventricular systolic function, evidence of diastolic dysfunction, and a severly dilated left atrium, but minimal valvular abnormalities.  . Hyperlipemia   . Insomnia   . Diverticulitis 02/21/2012  . GERD (gastroesophageal reflux disease)   . Atrial fibrillation with rapid ventricular response 02/2014  . Arthritis     back    Medications:  Infusions:    She took her Apixaban dose at 0800 this morning  Assessment: 81 year old female presenting with chest pain. She is on chronic anticoagulation with Apixaban for atrial fibrillation.  Request received to start anticoagulation with Heparin. Guidelines recommend waiting 12 hours to start parenteral anticoagulation after the last dose of Apixaban. Also, apixaban interferes with the anti-Xa (heparin level) assay so PTTs  will be utilized to guide heparin dosing until the apixaban is no longer affecting the heparin level assay.  Goal of Therapy:  Heparin level 0.3-0.7 units/ml aPTT 66-102 seconds Monitor platelets by anticoagulation protocol: Yes   Plan:  Check baseline PTT and Heparin level now At 8pm: start Heparin drip at 900 units/hr. No bolus. Check PTT and Heparin level 8 hours after starting Heparin Daily PTT, Heparin level, CBC.  Legrand Como, Pharm.D., BCPS, AAHIVP Clinical Pharmacist Phone: [phone] or [phone] 09/15/2014, 3:05 PM

## 2014-09-15 NOTE — ED Notes (Signed)
Dr. Turner at the bedside.  

## 2014-09-15 NOTE — ED Notes (Signed)
Spoke with Dr. Radford Pax concerning pts increased chest pressure. Dr. Radford Pax said no medications at this time due to BP instability.

## 2014-09-15 NOTE — ED Provider Notes (Signed)
CSN: [id number]     Arrival date & time 09/15/14  1214 History   First MD Initiated Contact with Patient 09/15/14 1217     Chief Complaint  Patient presents with  . Chest Pain  . Shortness of Breath     (Consider location/radiation/quality/duration/timing/severity/associated sxs/prior Treatment) HPI Jacqueline [name] is a 81 y.o. female with a history of hypertension, coronary artery disease, CABG, TEE with cardioversion, A. fib on Eliquis comes in for evaluation of chest discomfort. Patient states this morning at 8:30 she began to have central chest discomfort that was similar to "when my heart got out of rhythm earlier". She reports associated she stated shortness of breath that has resolved, chest tightness that has improved but is still present that she rates as a 7/10. She took her blood pressure medicines this morning as well as 3 sublingual nitroglycerin which did not relieve her discomfort. She denies any nausea or vomiting, diaphoresis. The discomfort does not radiate anywhere. She denies any other symptoms at this time.  Past Medical History  Diagnosis Date  . Hypertension   . CAD (coronary artery disease)   . History of stress test     Normal perfusion study which shows normal perfusion throughout and an EF 77%  . Hx of echocardiogram     Showed mild left ventricular hypertrophy, perserved left ventricular systolic function, evidence of diastolic dysfunction, and a severly dilated left atrium, but minimal valvular abnormalities.  . Hyperlipemia   . Insomnia   . Diverticulitis 02/21/2012  . GERD (gastroesophageal reflux disease)   . Atrial fibrillation with rapid ventricular response 02/2014  . Arthritis     back   Past Surgical History  Procedure Laterality Date  . Coronary artery bypass graft  2006    off-pump bypass surgery with LIMA to the LAD and SVG to the second diagonal artery ( performed by Dr Servando Snare)   . Tee without cardioversion N/A 02/28/2014    Procedure:  TRANSESOPHAGEAL ECHOCARDIOGRAM (TEE);  Surgeon: Sanda Klein, MD;  Location: [facility] ENDOSCOPY;  Service: Cardiovascular;  Laterality: N/A;  . Cardioversion N/A 02/28/2014    Procedure: CARDIOVERSION;  Surgeon: Sanda Klein, MD;  Location: [facility] ENDOSCOPY;  Service: Cardiovascular;  Laterality: N/A;  . Cataract extraction Bilateral 2014  . Abdominal hysterectomy  1975  . Rotator cuff repair Bilateral     r-1999, l- 2005  . Joint replacement Bilateral     L-2004, R-2006  . Total knee arthroplasty Right 07/02/2014    Procedure: Resection of Infected Right Total Knee Arthroplasty with placement antibiotic spacer;  Surgeon: Mauri Pole, MD;  Location: [facility] ORS;  Service: Orthopedics;  Laterality: Right;   Family History  Problem Relation Age of Onset  . Parkinson's disease Mother    History  Substance Use Topics  . Smoking status: Never Smoker   . Smokeless tobacco: Never Used  . Alcohol Use: No   OB History    No data available     Review of Systems A 10 point review of systems was completed and was negative except for pertinent positives and negatives as mentioned in the history of present illness     Allergies  Atorvastatin; Crestor; and Morphine and related  Home Medications   Prior to Admission medications   Medication Sig Start Date End Date Taking? Authorizing Provider  alendronate (FOSAMAX) 70 MG tablet Take 1 tablet (70 mg total) by mouth once a week. Take with a full glass of water on an empty stomach, remain upright Patient  taking differently: Take 70 mg by mouth once a week. Saturday 03/22/14  Yes Zigmund Gottron, MD  ALPRAZolam Duanne Moron) 0.5 MG tablet Take 1 tablet (0.5 mg total) by mouth 2 (two) times daily. 05/21/14  Yes Zigmund Gottron, MD  apixaban (ELIQUIS) 5 MG TABS tablet Take 1 tablet (5 mg total) by mouth 2 (two) times daily. 02/28/14  Yes Rhonda G Barrett, PA-C  Cholecalciferol (VITAMIN D-3) 1000 UNITS CAPS Take 2,000 Units by mouth 2 (two) times daily.    Yes Historical Provider, MD  diphenhydrAMINE (BENADRYL) 25 MG tablet Take 75 mg by mouth 2 (two) times daily as needed for allergies (eye allergies).    Yes Historical Provider, MD  enalapril (VASOTEC) 10 MG tablet TAKE 1 BY MOUTH TWICE DAILY 07/09/14  Yes Zigmund Gottron, MD  ferrous sulfate 325 (65 FE) MG tablet Take 1 tablet (325 mg total) by mouth 3 (three) times daily after meals. 07/05/14  Yes Lucille Passy Babish, PA-C  hydrochlorothiazide (MICROZIDE) 12.5 MG capsule Take 12.5 mg by mouth every morning.   Yes Historical Provider, MD  loperamide (IMODIUM) 2 MG capsule Take 1 capsule (2 mg total) by mouth as needed for diarrhea or loose stools. 07/05/14  Yes Lucille Passy Babish, PA-C  metoprolol tartrate (LOPRESSOR) 25 MG tablet Take 1 tablet (25 mg total) by mouth 2 (two) times daily. 06/04/14  Yes Zigmund Gottron, MD  nitroGLYCERIN (NITROSTAT) 0.4 MG SL tablet Place 1 tablet (0.4 mg total) under the tongue every 5 (five) minutes as needed for chest pain. 03/21/14  Yes Mihai Croitoru, MD  Omega-3 Fatty Acids (FISH OIL) 1000 MG CAPS Take 3 capsules by mouth daily. Per Cardiologist   Yes Historical Provider, MD  omeprazole (PRILOSEC) 20 MG capsule Take 1 capsule (20 mg total) by mouth daily. Patient taking differently: Take 20 mg by mouth every morning.  05/21/14  Yes Zigmund Gottron, MD  oxyCODONE (OXY IR/ROXICODONE) 5 MG immediate release tablet Take 1 tablet (5 mg total) by mouth every 6 (six) hours as needed for moderate pain or severe pain. 08/10/14  Yes Zigmund Gottron, MD  polyethylene glycol [facility] / Floria Raveling) packet Take 17 g by mouth daily as needed for moderate constipation (couple times weekly, depending).   Yes Historical Provider, MD  Polyvinyl Alcohol-Povidone (REFRESH OP) Apply 1 drop to eye 3 (three) times daily as needed (dry eyes).   Yes Historical Provider, MD  rOPINIRole (REQUIP) 0.25 MG tablet Take 1 tablet (0.25 mg total) by mouth 3 (three) times  daily. 06/04/14  Yes Zigmund Gottron, MD  simvastatin (ZOCOR) 40 MG tablet Take 1 tablet (40 mg total) by mouth daily at 6 PM. Patient taking differently: Take 40 mg by mouth at bedtime.  05/21/14  Yes Zigmund Gottron, MD  tiZANidine (ZANAFLEX) 4 MG tablet Take 1 tablet (4 mg total) by mouth at bedtime. 08/10/14  Yes Zigmund Gottron, MD  traMADol (ULTRAM) 50 MG tablet Take 50 mg by mouth 2 (two) times daily as needed. 08/25/14  Yes Historical Provider, MD  traZODone (DESYREL) 50 MG tablet Take 1 tablet (50 mg total) by mouth at bedtime. 05/21/14  Yes Zigmund Gottron, MD  vitamin B-12 (CYANOCOBALAMIN) 100 MCG tablet Take 2,000 mcg by mouth 2 (two) times daily.   Yes Historical Provider, MD  cefTRIAXone (ROCEPHIN) 40 MG/ML IVPB Inject 50 mLs (2 g total) into the vein daily. Patient not taking: Reported on 09/15/2014 07/05/14   Lucille Passy Babish, PA-C  docusate  sodium 100 MG CAPS Take 100 mg by mouth 2 (two) times daily as needed for mild constipation. Patient not taking: Reported on 09/15/2014 07/05/14   Lucille Passy Babish, PA-C  vancomycin (VANCOCIN) 1 GM/200ML SOLN Inject 200 mLs (1,000 mg total) into the vein daily. Patient not taking: Reported on 09/15/2014 07/05/14   Lucille Passy Babish, PA-C   BP 165/76 mmHg  Pulse 63  Temp(Src) 98.1 F (36.7 C) (Oral)  Resp 16  Ht 5\' 1"  (1.549 m)  Wt 137 lb 9.1 oz (62.4 kg)  BMI 26.01 kg/m2  SpO2 100% Physical Exam  Constitutional: She is oriented to person, place, and time. She appears well-developed and well-nourished.  HENT:  Head: Normocephalic and atraumatic.  Mouth/Throat: Oropharynx is clear and moist.  Eyes: Conjunctivae are normal. Pupils are equal, round, and reactive to light. Right eye exhibits no discharge. Left eye exhibits no discharge. No scleral icterus.  Neck: Normal range of motion. Neck supple. No tracheal deviation present.  Cardiovascular: Normal rate, regular rhythm and normal heart sounds.    Pulmonary/Chest: Effort normal and breath sounds normal. No respiratory distress. She has no wheezes. She has no rales.  Abdominal: Soft. There is no tenderness.  Musculoskeletal: Normal range of motion. She exhibits no edema or tenderness.  Neurological: She is alert and oriented to person, place, and time.  Cranial Nerves II-XII grossly intact  Skin: Skin is warm and dry. No rash noted.  Psychiatric: She has a normal mood and affect.  Nursing note and vitals reviewed.   ED Course  Procedures (including critical care time) Labs Review Labs Reviewed  BASIC METABOLIC PANEL - Abnormal; Notable for the following:    Potassium 3.2 (*)    Glucose, Bld 124 (*)    Creatinine, Ser 1.38 (*)    GFR calc non Af Amer 35 (*)    GFR calc Af Amer 40 (*)    All other components within normal limits  CBC - Abnormal; Notable for the following:    RBC 3.70 (*)    Hemoglobin 11.4 (*)    HCT 34.2 (*)    All other components within normal limits  APTT - Abnormal; Notable for the following:    aPTT 41 (*)    All other components within normal limits  HEPARIN LEVEL (UNFRACTIONATED) - Abnormal; Notable for the following:    Heparin Unfractionated 1.70 (*)    All other components within normal limits  COMPREHENSIVE METABOLIC PANEL - Abnormal; Notable for the following:    Potassium 3.2 (*)    Creatinine, Ser 1.28 (*)    Total Protein 5.9 (*)    Albumin 3.4 (*)    GFR calc non Af Amer 38 (*)    GFR calc Af Amer 44 (*)    All other components within normal limits  TROPONIN I - Abnormal; Notable for the following:    Troponin I 0.06 (*)    All other components within normal limits  CBC WITH DIFFERENTIAL/PLATELET - Abnormal; Notable for the following:    RBC 3.64 (*)    Hemoglobin 10.9 (*)    HCT 33.1 (*)    All other components within normal limits  PROTIME-INR - Abnormal; Notable for the following:    Prothrombin Time 16.4 (*)    All other components within normal limits  APTT - Abnormal;  Notable for the following:    aPTT 41 (*)    All other components within normal limits  MRSA PCR SCREENING  AMYLASE  LIPASE, BLOOD  HEPATIC FUNCTION PANEL  MAGNESIUM  TSH  APTT  HEPARIN LEVEL (UNFRACTIONATED)  CBC  TROPONIN I  TROPONIN I  OCCULT BLOOD X 1 CARD TO LAB, STOOL  BASIC METABOLIC PANEL  LIPID PANEL  PROTIME-INR  I-STAT TROPOININ, ED  Randolm Idol, ED    Imaging Review Dg Chest Port 1 View  09/15/2014   CLINICAL DATA:  Chest pain  EXAM: PORTABLE CHEST - 1 VIEW  COMPARISON:  02/26/2014  FINDINGS: Cardiomediastinal silhouette is stable. No acute infiltrate or pleural effusion. No pulmonary edema. Status post CABG. Again noted postsurgical changes right humeral head. Mild degenerative changes bilateral shoulders.  IMPRESSION: No active disease.  No significant change.   Electronically Signed   By: Lahoma Crocker M.D.   On: 09/15/2014 13:03     EKG Interpretation   Date/Time:  Saturday September 15 2014 15:50:30 EST Ventricular Rate:  58 PR Interval:  152 QRS Duration: 90 QT Interval:  440 QTC Calculation: 432 R Axis:   46 Text Interpretation:  Normal sinus rhythm No significant change since last  tracing Confirmed by RAY MD, Andee Poles [phone]) on 09/15/2014 3:57:46 PM     Meds given in ED:  Medications  heparin ADULT infusion 100 units/mL (25000 units/250 mL) (not administered)  traMADol (ULTRAM) tablet 50 mg (not administered)  oxyCODONE (Oxy IR/ROXICODONE) immediate release tablet 5 mg (not administered)  tiZANidine (ZANAFLEX) tablet 4 mg (not administered)  enalapril (VASOTEC) tablet 10 mg (not administered)  metoprolol tartrate (LOPRESSOR) tablet 25 mg (not administered)  rOPINIRole (REQUIP) tablet 0.25 mg (0.25 mg Oral Given 09/15/14 1811)  ferrous sulfate tablet 325 mg (325 mg Oral Given 09/15/14 1811)  ALPRAZolam (XANAX) tablet 0.5 mg (not administered)  simvastatin (ZOCOR) tablet 40 mg (40 mg Oral Given 09/15/14 1811)  traZODone (DESYREL) tablet 50 mg  (not administered)  polyethylene glycol (MIRALAX / GLYCOLAX) packet 17 g (not administered)  aspirin EC tablet 81 mg (not administered)  nitroGLYCERIN (NITROSTAT) SL tablet 0.4 mg (not administered)  acetaminophen (TYLENOL) tablet 650 mg (not administered)  ondansetron (ZOFRAN) injection 4 mg (not administered)  pantoprazole (PROTONIX) EC tablet 40 mg (not administered)  nitroGLYCERIN 50 mg in dextrose 5 % 250 mL (0.2 mg/mL) infusion (0 mcg/min Intravenous Stopped 09/15/14 1645)  fentaNYL (SUBLIMAZE) injection 25 mcg (25 mcg Intravenous Given 09/15/14 1451)  aspirin chewable tablet 324 mg (324 mg Oral Given 09/15/14 1811)    Or  aspirin suppository 300 mg ( Rectal See Alternative 09/15/14 1811)  potassium chloride SA (K-DUR,KLOR-CON) CR tablet 40 mEq (40 mEq Oral Given 09/15/14 1608)  sodium chloride 0.9 % bolus 1,000 mL (1,000 mLs Intravenous New Bag/Given 09/15/14 1608)    Current Discharge Medication List     Filed Vitals:   09/15/14 1551 09/15/14 1600 09/15/14 1615 09/15/14 1644  BP: 129/64 147/61 155/69 165/76  Pulse: 58 58 55 63  Temp:    98.1 F (36.7 C)  TempSrc:    Oral  Resp: 16 19 15 16   Height:    5\' 1"  (1.549 m)  Weight:    137 lb 9.1 oz (62.4 kg)  SpO2: 100% 100% 100% 100%    MDM  Patient with cardiac history presents with chest pain since 8:00 this morning, chest pain now 5/10 improving with Nitro given in ED. New EKG changes with V1 and V2 T wave inversions.  Initial Trop 0.02.  Taking Eliquis. CXR stable, unchanged.  Consult  To Cardiology, will see in ED. Pt admitted. Per Cardiology, will initiate heparin and nitro  drip, repeat troponin. Pt stable for admission. Prior to pt admission, I discussed this patient with my attending, Dr. Jeanell Sparrow. Final diagnoses:  None        Verl Dicker, PA-C 09/15/14 2024  Shaune Pollack, MD 09/21/14 1551  Shaune Pollack, MD 09/21/14 [phone]

## 2014-09-16 LAB — BASIC METABOLIC PANEL
ANION GAP: 7 (ref 5–15)
BUN: 15 mg/dL (ref 6–23)
CALCIUM: 9 mg/dL (ref 8.4–10.5)
CHLORIDE: 112 mmol/L (ref 96–112)
CO2: 21 mmol/L (ref 19–32)
Creatinine, Ser: 1.28 mg/dL — ABNORMAL HIGH (ref 0.50–1.10)
GFR, EST AFRICAN AMERICAN: 44 mL/min — AB (ref >90)
GFR, EST NON AFRICAN AMERICAN: 38 mL/min — AB (ref >90)
Glucose, Bld: 91 mg/dL (ref 70–99)
Potassium: 3.7 mmol/L (ref 3.5–5.1)
SODIUM: 140 mmol/L (ref 135–145)

## 2014-09-16 LAB — PROTIME-INR
INR: 1.38 (ref 0.00–1.49)
Prothrombin Time: 17.1 seconds — ABNORMAL HIGH (ref 11.6–15.2)

## 2014-09-16 LAB — LIPID PANEL
Cholesterol: 132 mg/dL (ref 0–200)
HDL: 33 mg/dL — ABNORMAL LOW (ref >39)
LDL CALC: 69 mg/dL (ref 0–99)
Total CHOL/HDL Ratio: 4 RATIO
Triglycerides: 148 mg/dL (ref <150)
VLDL: 30 mg/dL (ref 0–40)

## 2014-09-16 LAB — CBC
HCT: 30.5 % — ABNORMAL LOW (ref 36.0–46.0)
Hemoglobin: 10.1 g/dL — ABNORMAL LOW (ref 12.0–15.0)
MCH: 30.1 pg (ref 26.0–34.0)
MCHC: 33.1 g/dL (ref 30.0–36.0)
MCV: 91 fL (ref 78.0–100.0)
Platelets: 149 10*3/uL — ABNORMAL LOW (ref 150–400)
RBC: 3.35 MIL/uL — ABNORMAL LOW (ref 3.87–5.11)
RDW: 13.8 % (ref 11.5–15.5)
WBC: 4.2 10*3/uL (ref 4.0–10.5)

## 2014-09-16 LAB — APTT
aPTT: 147 seconds — ABNORMAL HIGH (ref 24–37)
aPTT: 98 seconds — ABNORMAL HIGH (ref 24–37)

## 2014-09-16 LAB — TROPONIN I: TROPONIN I: 0.06 ng/mL — AB (ref <0.031)

## 2014-09-16 LAB — HEPARIN LEVEL (UNFRACTIONATED): HEPARIN UNFRACTIONATED: 1.52 [IU]/mL — AB (ref 0.30–0.70)

## 2014-09-16 MED ORDER — TRAZODONE HCL 50 MG PO TABS: 50.0000 mg | ORAL_TABLET | Freq: Every day | ORAL | Status: DC

## 2014-09-16 MED ORDER — SODIUM CHLORIDE 0.9 % IV SOLN: 250.0000 mL | INTRAVENOUS | Status: DC | PRN

## 2014-09-16 MED ORDER — SIMVASTATIN 40 MG PO TABS
40.0000 mg | ORAL_TABLET | Freq: Every day | ORAL | Status: DC
  Administered 2014-09-16 – 2014-09-17 (×2): 40 mg via ORAL
  Filled 2014-09-16 (×3): qty 1

## 2014-09-16 MED ORDER — SODIUM CHLORIDE 0.9 % IJ SOLN
3.0000 mL | Freq: Two times a day (BID) | INTRAMUSCULAR | Status: DC
  Administered 2014-09-17: 3 mL via INTRAVENOUS

## 2014-09-16 MED ORDER — SODIUM CHLORIDE 0.9 % IJ SOLN: 3.0000 mL | INTRAMUSCULAR | Status: DC | PRN

## 2014-09-16 MED ORDER — SODIUM CHLORIDE 0.9 % IV SOLN
1.0000 mL/kg/h | INTRAVENOUS | Status: DC
  Administered 2014-09-17: 1 mL/kg/h via INTRAVENOUS

## 2014-09-16 MED ORDER — ASPIRIN 81 MG PO CHEW
81.0000 mg | CHEWABLE_TABLET | ORAL | Status: AC
  Administered 2014-09-17: 81 mg via ORAL
  Filled 2014-09-16: qty 1

## 2014-09-16 NOTE — Progress Notes (Addendum)
Subjective:   Still with chest tightness but improved. Unable to tolerate NTG.. Troponins remain flat 0.06, 0.08. TWI v1 and v2 now resolved.      Intake/Output Summary (Last 24 hours) at 09/16/14 1232 Last data filed at 09/16/14 0950  Gross per 24 hour  Intake    202 ml  Output    280 ml  Net    -78 ml    Current meds: . ALPRAZolam  0.5 mg Oral BID  . aspirin EC  81 mg Oral Daily  . enalapril  10 mg Oral BID  . ferrous sulfate  325 mg Oral TID PC  . metoprolol tartrate  25 mg Oral BID  . pantoprazole  40 mg Oral Daily  . rOPINIRole  0.25 mg Oral TID  . simvastatin  40 mg Oral q1800  . tiZANidine  4 mg Oral QHS  . traZODone  50 mg Oral QHS   Infusions: . heparin 700 Units/hr (09/16/14 0542)     Objective:  Blood pressure 98/50, pulse 71, temperature 98.3 F (36.8 C), temperature source Oral, resp. rate 14, height 5\' 1"  (1.549 m), weight 62.4 kg (137 lb 9.1 oz), SpO2 97 %. Weight change:   Physical Exam: General:  Well appearing. No resp difficulty HEENT: normal Neck: supple. JVP 5 . Carotids 2+ bilat; no bruits. No lymphadenopathy or thryomegaly appreciated. Cor: PMI nondisplaced. Regular rate & rhythm. No rubs, gallops or murmurs. Lungs: clear Abdomen: soft, nontender, nondistended. No hepatosplenomegaly. No bruits or masses. Good bowel sounds. Extremities: no cyanosis, clubbing, rash, edema Neuro: alert & orientedx3, cranial nerves grossly intact. moves all 4 extremities w/o difficulty. Affect pleasant  Telemetry: NSR  Lab Results: Basic Metabolic Panel:  Recent Labs Lab 09/15/14 1252 09/15/14 1758 09/16/14 0423  NA 136 137 140  K 3.2* 3.2* 3.7  CL 105 106 112  CO2 21 22 21   GLUCOSE 124* 90 91  BUN 20 16 15   CREATININE 1.38* 1.28* 1.28*  CALCIUM 9.5 8.8 9.0  MG  --  1.6  --    Liver Function Tests:  Recent Labs Lab 09/15/14 1640 09/15/14 1758  AST 26 25  ALT 21 20  ALKPHOS 51 50  BILITOT 0.5 0.3  PROT 6.5 5.9*  ALBUMIN 3.6 3.4*     Recent Labs Lab 09/15/14 1640  LIPASE 27  AMYLASE 42   No results for input(s): AMMONIA in the last 168 hours. CBC:  Recent Labs Lab 09/15/14 1252 09/15/14 1758 09/16/14 0423  WBC 4.9 4.3 4.2  NEUTROABS  --  2.3  --   HGB 11.4* 10.9* 10.1*  HCT 34.2* 33.1* 30.5*  MCV 92.4 90.9 91.0  PLT 163 162 149*   Cardiac Enzymes:  Recent Labs Lab 09/15/14 1758 09/15/14 2220 09/16/14 0423  TROPONINI 0.06* 0.08* 0.06*   BNP: Invalid input(s): POCBNP CBG: No results for input(s): GLUCAP in the last 168 hours. Microbiology: Lab Results  Component Value Date   CULT  07/02/2014    NO ANAEROBES ISOLATED Performed at [place]  07/02/2014    NO GROWTH 3 DAYS Performed at [facility]    No results for input(s): CULT, SDES in the last 168 hours.  Imaging: Dg Chest Port 1 View  09/15/2014   CLINICAL DATA:  Chest pain  EXAM: PORTABLE CHEST - 1 VIEW  COMPARISON:  02/26/2014  FINDINGS: Cardiomediastinal silhouette is stable. No acute infiltrate or pleural effusion. No pulmonary edema. Status post CABG. Again noted postsurgical changes  right humeral head. Mild degenerative changes bilateral shoulders.  IMPRESSION: No active disease.  No significant change.   Electronically Signed   By: Lahoma Crocker M.D.   On: 09/15/2014 13:03     ASSESSMENT:  1. Unstable angina   -- EKG shows new T wave inversions in V1 and V2 on admit. Now resolved. Troponin minimally elevated    --last cath 2006 2. PAF maintaining NSR. CHADSVASC = 5   --s/p DC-CV 8/15   --Eliquis switched to heparin in house. 3. Hypokalemia 4. ASCAD s/p CABG with LIMA to LAD and SVG to D2 with known atretic LIMA 5. HTN - well controlled 6. CDK stage II, stable 7. S/p R TKA c/b prosthetic joint infection now with cement spacer  PLAN/DISCUSSION:  Still with minimal chest tightness but troponins flat. ECG changes improved. Will continue heparin, asa, b-blocker and statin. Hold enalapril  prior to cath. Was going to switch simva to atorva but she is unable to tolerate the high-potency statins.   I have reviewed the risks, indications, and alternatives to angioplasty and stenting with the patient. Risks include but are not limited to bleeding, infection, vascular injury, stroke, myocardial infection, arrhythmia, kidney injury, radiation-related injury in the case of prolonged fluoroscopy use, emergency cardiac surgery, and death. The patient understands the risks of serious complication is low ([id number]) and he agrees to proceed.    She is very concerned that she would not be able to have knee spacers removed and knee replaced in March with Dr. Alvan Dame if she requires long-term DAPT. I spoke to Dr. Alvan Dame and he said only limitation to spacers remaining in would be her pain and functional limitation. Ideally if she requires stenting would use BMS.    LOS: 1 day    Glori Bickers, MD 09/16/2014, 12:32 PM

## 2014-09-16 NOTE — Progress Notes (Signed)
ANTICOAGULATION CONSULT NOTE - Follow Up Consult  Pharmacy Consult for heparin Indication: chest pain/ACS and afib  Allergies  Allergen Reactions  . Atorvastatin Other (See Comments)    Myalgias in legs  . Crestor [Rosuvastatin] Other (See Comments)    Myalgias in legs  . Morphine And Related Other (See Comments)    "Crazy thoughts, severe headache, sick"    Patient Measurements: Height: 5\' 1"  (154.9 cm) Weight: 137 lb 9.1 oz (62.4 kg) IBW/kg (Calculated) : 47.8 Heparin Dosing Weight:   Vital Signs: Temp: 98.3 F (36.8 C) (02/21 1200) Temp Source: Oral (02/21 1200) BP: 145/79 mmHg (02/21 1600) Pulse Rate: 64 (02/21 1600)  Labs:  Recent Labs  09/15/14 1252  09/15/14 1510 09/15/14 1758 09/15/14 2220 09/16/14 0423 09/16/14 1605  HGB 11.4*  --   --  10.9*  --  10.1*  --   HCT 34.2*  --   --  33.1*  --  30.5*  --   PLT 163  --   --  162  --  149*  --   APTT  --   < > 41* 41*  --  147* 98*  LABPROT  --   --   --  16.4*  --  17.1*  --   INR  --   --   --  1.31  --  1.38  --   HEPARINUNFRC  --   --  1.70*  --   --  >2.20*  --   CREATININE 1.38*  --   --  1.28*  --  1.28*  --   TROPONINI  --   --   --  0.06* 0.08* 0.06*  --   < > = values in this interval not displayed.  Estimated Creatinine Clearance: 29.2 mL/min (by C-G formula based on Cr of 1.28).   Medications:  Scheduled:  . ALPRAZolam  0.5 mg Oral BID  . [START ON 09/17/2014] aspirin  81 mg Oral Pre-Cath  . aspirin EC  81 mg Oral Daily  . ferrous sulfate  325 mg Oral TID PC  . metoprolol tartrate  25 mg Oral BID  . pantoprazole  40 mg Oral Daily  . rOPINIRole  0.25 mg Oral TID  . simvastatin  40 mg Oral QHS  . sodium chloride  3 mL Intravenous Q12H  . tiZANidine  4 mg Oral QHS  . traZODone  50 mg Oral QHS   Infusions:  . [START ON 09/17/2014] sodium chloride    . heparin 700 Units/hr (09/16/14 0542)    Assessment: 81 yo female with ACS and afib is currently on therapeutic heparin.  Her aPTT level is  98 and heparin level is 1.52  Goal of Therapy:  aPTT 66-102 seconds; heparin level 0.3-0.7 units/ml Monitor platelets by anticoagulation protocol: Yes   Plan:  - continue heparin at 700 units/hr - repeat heparin level and aPTT in 8hrs to confirm  Teal Bontrager, Tsz-Yin 09/16/2014,4:50 PM

## 2014-09-16 NOTE — Progress Notes (Signed)
Took over from the outgoing RN, pt denied any discomfort.

## 2014-09-16 NOTE — Progress Notes (Signed)
ANTICOAGULATION CONSULT NOTE - Follow Up Consult  Pharmacy Consult for Heparin  Indication: chest pain/ACS and atrial fibrillation  Allergies  Allergen Reactions  . Atorvastatin Other (See Comments)    Myalgias in legs  . Crestor [Rosuvastatin] Other (See Comments)    Myalgias in legs  . Morphine And Related Other (See Comments)    "Crazy thoughts, severe headache, sick"    Patient Measurements: Height: 5\' 1"  (154.9 cm) Weight: 137 lb 9.1 oz (62.4 kg) IBW/kg (Calculated) : 47.8  Vital Signs: Temp: 98.4 F (36.9 C) (02/21 0306) Temp Source: Oral (02/21 0306) BP: 119/44 mmHg (02/21 0000) Pulse Rate: 65 (02/21 0000)  Labs:  Recent Labs  09/15/14 1252 09/15/14 1510 09/15/14 1758 09/15/14 2220 09/16/14 0423  HGB 11.4*  --  10.9*  --  10.1*  HCT 34.2*  --  33.1*  --  30.5*  PLT 163  --  162  --  149*  APTT  --  41* 41*  --  147*  LABPROT  --   --  16.4*  --  17.1*  INR  --   --  1.31  --  1.38  HEPARINUNFRC  --  1.70*  --   --   --   CREATININE 1.38*  --  1.28*  --   --   TROPONINI  --   --  0.06* 0.08*  --     Estimated Creatinine Clearance: 29.2 mL/min (by C-G formula based on Cr of 1.28).   Assessment: Supra-therapeutic aPTT (using aPTT given PTA apixaban influence on heparin level). Other labs as above. No issues per RN.   Goal of Therapy:  Heparin level 0.3-0.7 units/ml aPTT 66-102 seconds Monitor platelets by anticoagulation protocol: Yes   Plan:  -Decrease heparin drip to 700 units/hr -1400 HL -Daily CBC/HL -Monitor for bleeding  Narda Bonds 09/16/2014,5:36 AM

## 2014-09-17 ENCOUNTER — Encounter (HOSPITAL_COMMUNITY)
Admission: EM | Disposition: A | Discharge status: Home / Self Care | Payer: Self-pay | Source: Home / Self Care | Attending: Cardiology

## 2014-09-17 DIAGNOSIS — R079 Chest pain, unspecified: Secondary | ICD-10-CM

## 2014-09-17 DIAGNOSIS — I2510 Atherosclerotic heart disease of native coronary artery without angina pectoris: Secondary | ICD-10-CM

## 2014-09-17 HISTORY — PX: LEFT HEART CATHETERIZATION WITH CORONARY/GRAFT ANGIOGRAM: SHX5450

## 2014-09-17 LAB — CBC
HEMATOCRIT: 30.1 % — AB (ref 36.0–46.0)
HEMOGLOBIN: 10.1 g/dL — AB (ref 12.0–15.0)
MCH: 30.2 pg (ref 26.0–34.0)
MCHC: 33.6 g/dL (ref 30.0–36.0)
MCV: 90.1 fL (ref 78.0–100.0)
Platelets: 148 10*3/uL — ABNORMAL LOW (ref 150–400)
RBC: 3.34 MIL/uL — AB (ref 3.87–5.11)
RDW: 13.5 % (ref 11.5–15.5)
WBC: 3.8 10*3/uL — ABNORMAL LOW (ref 4.0–10.5)

## 2014-09-17 LAB — HEPARIN LEVEL (UNFRACTIONATED)
HEPARIN UNFRACTIONATED: 1.22 [IU]/mL — AB (ref 0.30–0.70)
Heparin Unfractionated: 1.12 IU/mL — ABNORMAL HIGH (ref 0.30–0.70)

## 2014-09-17 LAB — POCT ACTIVATED CLOTTING TIME: ACTIVATED CLOTTING TIME: 104 s

## 2014-09-17 LAB — APTT
APTT: 80 s — AB (ref 24–37)
aPTT: 105 seconds — ABNORMAL HIGH (ref 24–37)

## 2014-09-17 SURGERY — LEFT HEART CATHETERIZATION WITH CORONARY/GRAFT ANGIOGRAM
Anesthesia: LOCAL

## 2014-09-17 MED ORDER — FENTANYL CITRATE 0.05 MG/ML IJ SOLN
INTRAMUSCULAR | Status: AC
  Filled 2014-09-17: qty 2

## 2014-09-17 MED ORDER — ONDANSETRON HCL 4 MG/2ML IJ SOLN: 4.0000 mg | Freq: Four times a day (QID) | INTRAMUSCULAR | Status: DC | PRN

## 2014-09-17 MED ORDER — SODIUM CHLORIDE 0.9 % IV SOLN: INTRAVENOUS | Status: DC

## 2014-09-17 MED ORDER — APIXABAN 5 MG PO TABS
5.0000 mg | ORAL_TABLET | Freq: Two times a day (BID) | ORAL | Status: DC
  Administered 2014-09-18: 10:00:00 5 mg via ORAL
  Filled 2014-09-17 (×2): qty 1

## 2014-09-17 MED ORDER — MIDAZOLAM HCL 2 MG/2ML IJ SOLN
INTRAMUSCULAR | Status: AC
  Filled 2014-09-17: qty 2

## 2014-09-17 MED ORDER — NITROGLYCERIN 1 MG/10 ML FOR IR/CATH LAB
INTRA_ARTERIAL | Status: AC
  Filled 2014-09-17: qty 10

## 2014-09-17 MED ORDER — ASPIRIN EC 81 MG PO TBEC
81.0000 mg | DELAYED_RELEASE_TABLET | Freq: Every day | ORAL | Status: DC
  Administered 2014-09-18: 10:00:00 81 mg via ORAL

## 2014-09-17 MED ORDER — ACETAMINOPHEN 325 MG PO TABS: 650.0000 mg | ORAL_TABLET | ORAL | Status: DC | PRN

## 2014-09-17 MED ORDER — HEPARIN (PORCINE) IN NACL 2-0.9 UNIT/ML-% IJ SOLN
INTRAMUSCULAR | Status: AC
  Filled 2014-09-17: qty 1000

## 2014-09-17 MED ORDER — LIDOCAINE HCL (PF) 1 % IJ SOLN
INTRAMUSCULAR | Status: AC
  Filled 2014-09-17: qty 30

## 2014-09-17 NOTE — CV Procedure (Signed)
Jacqueline [name] is a 81 y.o. female    AC:[id number]  KJ:[id number] LOCATION:  FACILITY: [place]  PHYSICIAN: Troy Sine, MD, [facility] April 15, 1931   DATE OF PROCEDURE:  09/17/2014    CARDIAC CATHETERIZATION     HISTORY:    Jacqueline [name] is a 81 y.o. female who is followed by Dr. [name].  The patient is status post CABG surgery over 10 years ago and had a LIMA placed to her LAD and a vein graft to her second diagonal vessel. At last catheterization in 2006 she was found to have an atretic LIMA vessel. She has a history of paroxysmal atrial fibrillation. She developed a recurrent episode of atrial fibrillation leading to her current hospitalization. This was associated with chest discomfort and tightness and was nitrate responsive. Eliquis has been held 2 days and she is referred for diagnostic catheterization.   PROCEDURE: Left heart catheterization: coronary angiography, left ventriculography, aortic arch injection; selective angiography into the saphenous vein graft supplying the diagonal vessel ; selective angiography into the left subclavian artery  The patient was brought to the [facility] cardiac catherization laboratory in the fasting state. She was premedicated with Versed1 mg and fentanyl 25 g.  Her right groin was prepped and shaved in usual sterile fashion. Xylocaine 1% was used for local anesthesia. A 5 French sheath was inserted into the R femoral artery. Diagnostic catheterizatiion was done with 5 [place] FL4, FR4,  Left bypass graft, Bernstein 2, and pigtail catheters. Left ventriculography was done with 25 cc Omnipaque contrast.  And aortic arch injection was necessary to I, the takeoff of the left subclavian artery.  Hemostasis was obtained by direct manual compression. The patient tolerated the procedure well.   HEMODYNAMICS:   Central Aorta: 180/71   Left Ventricle: 180/13  ANGIOGRAPHY:  Left main:  Angiographically normal vessel which bifurcated into the LAD and left  circumflex coronary artery.  LAD:  Moderate size vessel that gave rise to a proximal first diagonal vessel.  There was a 90% stenosis  Region of the septal perforating artery immediately proximal to the second diagonal vessel and there was evidence for filling of the LAD from this diagonal vessel which was supplied by the vein graft.  Left circumflex:  Moderate size angiographically normal vessel which gave rise to a major marginal branch.   Right coronary artery:  Angiographically normal dominant vessel which gave rise to the PDA and PLA vessels  LIMA to LAD:  Atretic and did not reach the LAD  SVG to  Diagonal 2 vessel was widely patent. There was retrograde filling of the entire LAD system with brisk TIMI-3.  The diagonal inserted into the LAD immediately distal to the 90% LAD  Stenosis.   Left ventriculography revealed  Reserved global LV contractility with an ejection fraction of 55-60%.  There is very minimal lateral hypocontractility.  Aortic arch aortagraphy identified the takeoff of the great vessels.  The left subclavian was visualized  IMPRESSION:  Normal LV function with very small region of subtle focalmild anterolateral hypocontractility.  Single vessel coronary obstructive disease with 90% stenosis in the LAD Lee proximal to the takeoff of the second diagonal vessel with filling of the LAD  Both antegrade as well as via the vein graft supplying the diagonal 2 vessel.  Normal left circumflex and RCA vessels.  RECOMMENDATION:  Medical therapy.  The patient will resume Eliquis anticoagulation tomorrow.    Troy Sine, MD, [facility] 09/17/2014 4:11 PM

## 2014-09-17 NOTE — Progress Notes (Signed)
56f sheath removed from RFA at 1615. Manual pressure applied to rt groin for 15 min.  Tegaderm dressing applied to site.  No complications.  Rt groin is level 0 and rt PT is palpable pre and post sheath removal.  Post sheath removal instructions given.  Pt. Acknowledges and understands instructions.

## 2014-09-17 NOTE — Interval H&P Note (Signed)
Cath Lab Visit (complete for each Cath Lab visit)  Clinical Evaluation Leading to the Procedure:   ACS: Yes.    Non-ACS:    Anginal Classification: CCS III  Anti-ischemic medical therapy: Maximal Therapy (2 or more classes of medications)  Non-Invasive Test Results: No non-invasive testing performed  Prior CABG: Previous CABG      History and Physical Interval Note:  09/17/2014 2:54 PM  Jacqueline [name]  has presented today for surgery, with the diagnosis of unstable angina  The various methods of treatment have been discussed with the patient and family. After consideration of risks, benefits and other options for treatment, the patient has consented to  Procedure(s): LEFT HEART CATHETERIZATION WITH CORONARY/GRAFT ANGIOGRAM (N/A) as a surgical intervention .  The patient's history has been reviewed, patient examined, no change in status, stable for surgery.  I have reviewed the patient's chart and labs.  Questions were answered to the patient's satisfaction.     Tamila Gaulin A

## 2014-09-17 NOTE — Progress Notes (Signed)
  Echocardiogram 2D Echocardiogram has been performed.  Shane Badeaux FRANCES 09/17/2014, 9:48 AM

## 2014-09-17 NOTE — Progress Notes (Signed)
INITIAL NUTRITION ASSESSMENT  DOCUMENTATION CODES Per approved criteria  -Not Applicable   INTERVENTION: -RD to follow for diet advancement -Supplement diet as appropriate  NUTRITION DIAGNOSIS: Inadequate oral intake related to inability to eat as evidenced by NPO.   Goal: Pt will meet >90% of estimated nutritional needs  Monitor:  Diet advancement, PO intake, labs, weight changes, I/O's  Reason for Assessment: MST=2  81 y.o. female  Admitting Dx: Unstable angina  Mrs. Jacqueline [name] is 81 years old and is roughly 10 years status post coronary artery bypass surgery (LIMA to LAD and SVG to diagonal 2 by Dr. Servando Snare with atretic LIMA but good distal flow via the other conduits) and has normal left ventricular systolic function and a relatively recent normal nuclear perfusion study  ASSESSMENT: Pt admitted with unstable angina. Pt for possible left heart cath today per MD notes.  Pt was in procedure at time of visit, so unable to complete full interview and nutrition-focused physical exam at this time.  Pt previously on a Heart Healthy diet. Good intake; 100% meal completion documented.  Wt hx reveals most weight loss over the past 4 months, however, not significant for time frame. Nutrition screen reveals that pt had good appetite PTA.  Will continue to monitor PO intake and supplement diet as appropriate.  Labs reviewed. Creat: 128.   Height: Ht Readings from Last 1 Encounters:  09/15/14 5\' 1"  (1.549 m)    Weight: Wt Readings from Last 1 Encounters:  09/15/14 137 lb 9.1 oz (62.4 kg)    Ideal Body Weight: 105#  % Ideal Body Weight: 130%  Wt Readings from Last 10 Encounters:  09/15/14 137 lb 9.1 oz (62.4 kg)  08/10/14 144 lb (65.318 kg)  07/02/14 149 lb (67.586 kg)  05/09/14 153 lb 3.2 oz (69.491 kg)  03/16/14 150 lb (68.04 kg)  03/14/14 152 lb (68.947 kg)  03/01/14 148 lb 8 oz (67.359 kg)  12/22/13 151 lb 4.8 oz (68.629 kg)  08/31/13 152 lb 4.8 oz (69.083 kg)   08/24/13 150 lb (68.04 kg)    Usual Body Weight: 150#  % Usual Body Weight: 91%  BMI:  Body mass index is 26.01 kg/(m^2).  Estimated Nutritional Needs: Kcal: 1600-1800 Protein: 70-80 grams Fluid: 1.6-1.8 L  Skin: WDL  Diet Order: Diet NPO time specified Except for: Sips with Meds  EDUCATION NEEDS: -Education not appropriate at this time   Intake/Output Summary (Last 24 hours) at 09/17/14 0914 Last data filed at 09/17/14 0800  Gross per 24 hour  Intake  637.5 ml  Output    850 ml  Net -212.5 ml    Last BM: 09/17/14  Labs:   Recent Labs Lab 09/15/14 1252 09/15/14 1758 09/16/14 0423  NA 136 137 140  K 3.2* 3.2* 3.7  CL 105 106 112  CO2 21 22 21   BUN 20 16 15   CREATININE 1.38* 1.28* 1.28*  CALCIUM 9.5 8.8 9.0  MG  --  1.6  --   GLUCOSE 124* 90 91    CBG (last 3)  No results for input(s): GLUCAP in the last 72 hours.  Scheduled Meds: . ALPRAZolam  0.5 mg Oral BID  . aspirin EC  81 mg Oral Daily  . ferrous sulfate  325 mg Oral TID PC  . metoprolol tartrate  25 mg Oral BID  . pantoprazole  40 mg Oral Daily  . rOPINIRole  0.25 mg Oral TID  . simvastatin  40 mg Oral QHS  . sodium chloride  3  mL Intravenous Q12H  . tiZANidine  4 mg Oral QHS  . traZODone  50 mg Oral QHS    Continuous Infusions: . sodium chloride 1 mL/kg/hr (09/17/14 0700)  . heparin 650 Units/hr (09/17/14 0700)    Past Medical History  Diagnosis Date  . Hypertension   . CAD (coronary artery disease)   . History of stress test     Normal perfusion study which shows normal perfusion throughout and an EF 77%  . Hx of echocardiogram     Showed mild left ventricular hypertrophy, perserved left ventricular systolic function, evidence of diastolic dysfunction, and a severly dilated left atrium, but minimal valvular abnormalities.  . Hyperlipemia   . Insomnia   . Diverticulitis 02/21/2012  . GERD (gastroesophageal reflux disease)   . Atrial fibrillation with rapid ventricular response  02/2014  . Arthritis     back    Past Surgical History  Procedure Laterality Date  . Coronary artery bypass graft  2006    off-pump bypass surgery with LIMA to the LAD and SVG to the second diagonal artery ( performed by Dr Servando Snare)   . Tee without cardioversion N/A 02/28/2014    Procedure: TRANSESOPHAGEAL ECHOCARDIOGRAM (TEE);  Surgeon: Sanda Klein, MD;  Location: [facility] ENDOSCOPY;  Service: Cardiovascular;  Laterality: N/A;  . Cardioversion N/A 02/28/2014    Procedure: CARDIOVERSION;  Surgeon: Sanda Klein, MD;  Location: [facility] ENDOSCOPY;  Service: Cardiovascular;  Laterality: N/A;  . Cataract extraction Bilateral 2014  . Abdominal hysterectomy  1975  . Rotator cuff repair Bilateral     r-1999, l- 2005  . Joint replacement Bilateral     L-2004, R-2006  . Total knee arthroplasty Right 07/02/2014    Procedure: Resection of Infected Right Total Knee Arthroplasty with placement antibiotic spacer;  Surgeon: Mauri Pole, MD;  Location: [facility] ORS;  Service: Orthopedics;  Laterality: Right;    Cobi Aldape A. Jimmye Norman, RD, LDN, CDE Pager: [phone] After hours Pager: [phone]

## 2014-09-17 NOTE — Progress Notes (Signed)
Subjective:   Still with chest tightness but improved. Unable to tolerate NTG.. Troponins remain flat 0.06, 0.08, .06. TWI v1 and v2 now resolved.      Intake/Output Summary (Last 24 hours) at 09/17/14 0954 Last data filed at 09/17/14 0800  Gross per 24 hour  Intake  637.5 ml  Output    850 ml  Net -212.5 ml    Current meds: . ALPRAZolam  0.5 mg Oral BID  . aspirin EC  81 mg Oral Daily  . ferrous sulfate  325 mg Oral TID PC  . metoprolol tartrate  25 mg Oral BID  . pantoprazole  40 mg Oral Daily  . rOPINIRole  0.25 mg Oral TID  . simvastatin  40 mg Oral QHS  . sodium chloride  3 mL Intravenous Q12H  . tiZANidine  4 mg Oral QHS  . traZODone  50 mg Oral QHS   Infusions: . sodium chloride 1 mL/kg/hr (09/17/14 0700)  . heparin 650 Units/hr (09/17/14 0700)     Objective:  Blood pressure 151/61, pulse 59, temperature 97.9 F (36.6 C), temperature source Oral, resp. rate 18, height 5\' 1"  (1.549 m), weight 137 lb 9.1 oz (62.4 kg), SpO2 97 %. Weight change:   Physical Exam: General:  Well appearing. No resp difficulty HEENT: normal Neck: supple. JVP 5 . Carotids 2+ bilat; no bruits. No lymphadenopathy or thryomegaly appreciated. Cor: PMI nondisplaced. Regular rate & rhythm. No rubs, gallops or murmurs. Lungs: clear Abdomen: soft, nontender, nondistended. No hepatosplenomegaly. No bruits or masses. Good bowel sounds. Extremities: no cyanosis, clubbing, rash, edema Neuro: alert & orientedx3, cranial nerves grossly intact. moves all 4 extremities w/o difficulty. Affect pleasant  Telemetry: NSR  Lab Results: Basic Metabolic Panel:  Recent Labs Lab 09/15/14 1252 09/15/14 1758 09/16/14 0423  NA 136 137 140  K 3.2* 3.2* 3.7  CL 105 106 112  CO2 21 22 21   GLUCOSE 124* 90 91  BUN 20 16 15   CREATININE 1.38* 1.28* 1.28*  CALCIUM 9.5 8.8 9.0  MG  --  1.6  --    Liver Function Tests:  Recent Labs Lab 09/15/14 1640 09/15/14 1758  AST 26 25  ALT 21 20  ALKPHOS  51 50  BILITOT 0.5 0.3  PROT 6.5 5.9*  ALBUMIN 3.6 3.4*    Recent Labs Lab 09/15/14 1640  LIPASE 27  AMYLASE 42   No results for input(s): AMMONIA in the last 168 hours. CBC:  Recent Labs Lab 09/15/14 1252 09/15/14 1758 09/16/14 0423 09/17/14  WBC 4.9 4.3 4.2 3.8*  NEUTROABS  --  2.3  --   --   HGB 11.4* 10.9* 10.1* 10.1*  HCT 34.2* 33.1* 30.5* 30.1*  MCV 92.4 90.9 91.0 90.1  PLT 163 162 149* 148*   Cardiac Enzymes:  Recent Labs Lab 09/15/14 1758 09/15/14 2220 09/16/14 0423  TROPONINI 0.06* 0.08* 0.06*   BNP: Invalid input(s): POCBNP CBG: No results for input(s): GLUCAP in the last 168 hours. Microbiology: Lab Results  Component Value Date   CULT  07/02/2014    NO ANAEROBES ISOLATED Performed at [facility]  07/02/2014    NO GROWTH 3 DAYS Performed at [facility]    No results for input(s): CULT, SDES in the last 168 hours.  Imaging: Dg Chest Port 1 View  09/15/2014   CLINICAL DATA:  Chest pain  EXAM: PORTABLE CHEST - 1 VIEW  COMPARISON:  02/26/2014  FINDINGS: Cardiomediastinal silhouette is stable. No acute  infiltrate or pleural effusion. No pulmonary edema. Status post CABG. Again noted postsurgical changes right humeral head. Mild degenerative changes bilateral shoulders.  IMPRESSION: No active disease.  No significant change.   Electronically Signed   By: Lahoma Crocker M.D.   On: 09/15/2014 13:03     ASSESSMENT:  1. Unstable angina   -- EKG shows new T wave inversions in V1 and V2 on admit. Now resolved. Troponin minimally elevated. Echo done but report pending.    --last cath 2006 2. PAF maintaining NSR. CHADSVASC = 5   --s/p DC-CV 8/15   --Eliquis switched to heparin in house. 3. Hypokalemia-repleted 4. ASCAD s/p CABG with LIMA to LAD and SVG to D2 with known atretic LIMA 5. HTN - well controlled 6. CDK stage II, stable 7. S/p R TKA c/b prosthetic joint infection now with cement  spacer  PLAN/DISCUSSION:  Still with minimal chest tightness but troponins flat. ECG changes improved. Will continue heparin, asa, b-blocker and statin. Hold enalapril prior to cath. She is unable to tolerate the high-potency statins.   Plan cardiac cath with possible PCI today.   She is very concerned that she would not be able to have knee spacers removed and knee replaced in March with Dr. Alvan Dame if she requires long-term DAPT. I spoke to Dr. Alvan Dame and he said only limitation to spacers remaining in would be her pain and functional limitation. Consider BMS depending on coronary anatomy.    LOS: 2 days    Ceejay Kegley [place], MD 09/17/2014, 9:54 AM

## 2014-09-17 NOTE — H&P (View-Only) (Signed)
Subjective:   Still with chest tightness but improved. Unable to tolerate NTG.. Troponins remain flat 0.06, 0.08, .06. TWI v1 and v2 now resolved.      Intake/Output Summary (Last 24 hours) at 09/17/14 0954 Last data filed at 09/17/14 0800  Gross per 24 hour  Intake  637.5 ml  Output    850 ml  Net -212.5 ml    Current meds: . ALPRAZolam  0.5 mg Oral BID  . aspirin EC  81 mg Oral Daily  . ferrous sulfate  325 mg Oral TID PC  . metoprolol tartrate  25 mg Oral BID  . pantoprazole  40 mg Oral Daily  . rOPINIRole  0.25 mg Oral TID  . simvastatin  40 mg Oral QHS  . sodium chloride  3 mL Intravenous Q12H  . tiZANidine  4 mg Oral QHS  . traZODone  50 mg Oral QHS   Infusions: . sodium chloride 1 mL/kg/hr (09/17/14 0700)  . heparin 650 Units/hr (09/17/14 0700)     Objective:  Blood pressure 151/61, pulse 59, temperature 97.9 F (36.6 C), temperature source Oral, resp. rate 18, height 5\' 1"  (1.549 m), weight 137 lb 9.1 oz (62.4 kg), SpO2 97 %. Weight change:   Physical Exam: General:  Well appearing. No resp difficulty HEENT: normal Neck: supple. JVP 5 . Carotids 2+ bilat; no bruits. No lymphadenopathy or thryomegaly appreciated. Cor: PMI nondisplaced. Regular rate & rhythm. No rubs, gallops or murmurs. Lungs: clear Abdomen: soft, nontender, nondistended. No hepatosplenomegaly. No bruits or masses. Good bowel sounds. Extremities: no cyanosis, clubbing, rash, edema Neuro: alert & orientedx3, cranial nerves grossly intact. moves all 4 extremities w/o difficulty. Affect pleasant  Telemetry: NSR  Lab Results: Basic Metabolic Panel:  Recent Labs Lab 09/15/14 1252 09/15/14 1758 09/16/14 0423  NA 136 137 140  K 3.2* 3.2* 3.7  CL 105 106 112  CO2 21 22 21   GLUCOSE 124* 90 91  BUN 20 16 15   CREATININE 1.38* 1.28* 1.28*  CALCIUM 9.5 8.8 9.0  MG  --  1.6  --    Liver Function Tests:  Recent Labs Lab 09/15/14 1640 09/15/14 1758  AST 26 25  ALT 21 20  ALKPHOS  51 50  BILITOT 0.5 0.3  PROT 6.5 5.9*  ALBUMIN 3.6 3.4*    Recent Labs Lab 09/15/14 1640  LIPASE 27  AMYLASE 42   No results for input(s): AMMONIA in the last 168 hours. CBC:  Recent Labs Lab 09/15/14 1252 09/15/14 1758 09/16/14 0423 09/17/14  WBC 4.9 4.3 4.2 3.8*  NEUTROABS  --  2.3  --   --   HGB 11.4* 10.9* 10.1* 10.1*  HCT 34.2* 33.1* 30.5* 30.1*  MCV 92.4 90.9 91.0 90.1  PLT 163 162 149* 148*   Cardiac Enzymes:  Recent Labs Lab 09/15/14 1758 09/15/14 2220 09/16/14 0423  TROPONINI 0.06* 0.08* 0.06*   BNP: Invalid input(s): POCBNP CBG: No results for input(s): GLUCAP in the last 168 hours. Microbiology: Lab Results  Component Value Date   CULT  07/02/2014    NO ANAEROBES ISOLATED Performed at [facility]  07/02/2014    NO GROWTH 3 DAYS Performed at [facility]    No results for input(s): CULT, SDES in the last 168 hours.  Imaging: Dg Chest Port 1 View  09/15/2014   CLINICAL DATA:  Chest pain  EXAM: PORTABLE CHEST - 1 VIEW  COMPARISON:  02/26/2014  FINDINGS: Cardiomediastinal silhouette is stable. No acute  infiltrate or pleural effusion. No pulmonary edema. Status post CABG. Again noted postsurgical changes right humeral head. Mild degenerative changes bilateral shoulders.  IMPRESSION: No active disease.  No significant change.   Electronically Signed   By: Lahoma Crocker M.D.   On: 09/15/2014 13:03     ASSESSMENT:  1. Unstable angina   -- EKG shows new T wave inversions in V1 and V2 on admit. Now resolved. Troponin minimally elevated. Echo done but report pending.    --last cath 2006 2. PAF maintaining NSR. CHADSVASC = 5   --s/p DC-CV 8/15   --Eliquis switched to heparin in house. 3. Hypokalemia-repleted 4. ASCAD s/p CABG with LIMA to LAD and SVG to D2 with known atretic LIMA 5. HTN - well controlled 6. CDK stage II, stable 7. S/p R TKA c/b prosthetic joint infection now with cement  spacer  PLAN/DISCUSSION:  Still with minimal chest tightness but troponins flat. ECG changes improved. Will continue heparin, asa, b-blocker and statin. Hold enalapril prior to cath. She is unable to tolerate the high-potency statins.   Plan cardiac cath with possible PCI today.   She is very concerned that she would not be able to have knee spacers removed and knee replaced in March with Dr. Alvan Dame if she requires long-term DAPT. I spoke to Dr. Alvan Dame and he said only limitation to spacers remaining in would be her pain and functional limitation. Consider BMS depending on coronary anatomy.    LOS: 2 days    Jacqueline Goodbar [place], MD 09/17/2014, 9:54 AM

## 2014-09-17 NOTE — Progress Notes (Signed)
ANTICOAGULATION CONSULT NOTE - Follow Up Consult  Pharmacy Consult for heparin Indication: chest pain/ACS and afib  Labs:  Recent Labs  09/15/14 1252  09/15/14 1758 09/15/14 2220 09/16/14 0423 09/16/14 1605 09/17/14  HGB 11.4*  --  10.9*  --  10.1*  --  10.1*  HCT 34.2*  --  33.1*  --  30.5*  --  30.1*  PLT 163  --  162  --  149*  --  148*  APTT  --   < > 41*  --  147* 98* 105*  LABPROT  --   --  16.4*  --  17.1*  --   --   INR  --   --  1.31  --  1.38  --   --   HEPARINUNFRC  --   < >  --   --  >2.20* 1.52* 1.22*  CREATININE 1.38*  --  1.28*  --  1.28*  --   --   TROPONINI  --   --  0.06* 0.08* 0.06*  --   --   < > = values in this interval not displayed.  Estimated Creatinine Clearance: 29.2 mL/min (by C-G formula based on Cr of 1.28).   Medications:  Scheduled:  . ALPRAZolam  0.5 mg Oral BID  . aspirin  81 mg Oral Pre-Cath  . aspirin EC  81 mg Oral Daily  . ferrous sulfate  325 mg Oral TID PC  . metoprolol tartrate  25 mg Oral BID  . pantoprazole  40 mg Oral Daily  . rOPINIRole  0.25 mg Oral TID  . simvastatin  40 mg Oral QHS  . sodium chloride  3 mL Intravenous Q12H  . tiZANidine  4 mg Oral QHS  . traZODone  50 mg Oral QHS   Infusions:  . sodium chloride    . heparin 700 Units/hr (09/17/14 0000)    Assessment: 81 yo female with ACS and afib is currently on heparin. Her aPTT is slightly above goal.  HL is 1.22 and still does not correlate with aPTT.  Goal of Therapy:  aPTT 66-102 seconds; heparin level 0.3-0.7 units/ml Monitor platelets by anticoagulation protocol: Yes   Plan:  - Decrease heparin to 650  units/hr - repeat heparin level and aPTT in 8hrs to confirm  Taraoluwa Thakur Poteet 09/17/2014,1:22 AM

## 2014-09-17 NOTE — Progress Notes (Addendum)
ANTICOAGULATION CONSULT NOTE - Follow Up Consult  Pharmacy Consult for heparin Indication: chest pain/ACS and afib  Labs:  Recent Labs  09/15/14 1252  09/15/14 1758 09/15/14 2220 09/16/14 0423 09/16/14 1605 09/17/14 09/17/14 1029  HGB 11.4*  --  10.9*  --  10.1*  --  10.1*  --   HCT 34.2*  --  33.1*  --  30.5*  --  30.1*  --   PLT 163  --  162  --  149*  --  148*  --   APTT  --   < > 41*  --  147* 98* 105* 80*  LABPROT  --   --  16.4*  --  17.1*  --   --   --   INR  --   --  1.31  --  1.38  --   --   --   HEPARINUNFRC  --   < >  --   --  >2.20* 1.52* 1.22* 1.12*  CREATININE 1.38*  --  1.28*  --  1.28*  --   --   --   TROPONINI  --   --  0.06* 0.08* 0.06*  --   --   --   < > = values in this interval not displayed.  Estimated Creatinine Clearance: 29.2 mL/min (by C-G formula based on Cr of 1.28).   Medications:  Scheduled:  . ALPRAZolam  0.5 mg Oral BID  . aspirin EC  81 mg Oral Daily  . ferrous sulfate  325 mg Oral TID PC  . metoprolol tartrate  25 mg Oral BID  . pantoprazole  40 mg Oral Daily  . rOPINIRole  0.25 mg Oral TID  . simvastatin  40 mg Oral QHS  . sodium chloride  3 mL Intravenous Q12H  . tiZANidine  4 mg Oral QHS  . traZODone  50 mg Oral QHS   Infusions:  . sodium chloride 1 mL/kg/hr (09/17/14 0700)  . heparin 650 Units/hr (09/17/14 0700)    Assessment: 81 yo female with ACS and afib is currently on heparin. Her aPTT is now at goal.  HL remains falsely elevated at  1.12 and still does not correlate with aPTT. H/H and plt remain stable. No bleeding noted.   Goal of Therapy:  aPTT 66-102 seconds; heparin level 0.3-0.7 units/ml Monitor platelets by anticoagulation protocol: Yes   Plan:  - Continue heparin at 650  units/hr - repeat heparin level and aPTT in 8hrs to confirm - Monitor closely for bleeding   Albertina Parr, PharmD., BCPS Clinical Pharmacist Pager [phone]  Addendum: Patient scheduled for cath today. Cancel f/u levels and f/u  after cath for anticoag plans.   Albertina Parr, PharmD., BCPS Clinical Pharmacist Pager [phone]

## 2014-09-18 ENCOUNTER — Telehealth: Payer: Self-pay | Admitting: Nurse Practitioner

## 2014-09-18 ENCOUNTER — Encounter (HOSPITAL_COMMUNITY): Payer: Self-pay | Admitting: Cardiovascular Disease

## 2014-09-18 DIAGNOSIS — I25118 Atherosclerotic heart disease of native coronary artery with other forms of angina pectoris: Secondary | ICD-10-CM

## 2014-09-18 LAB — CBC
HCT: 31 % — ABNORMAL LOW (ref 36.0–46.0)
HEMOGLOBIN: 10.3 g/dL — AB (ref 12.0–15.0)
MCH: 30.6 pg (ref 26.0–34.0)
MCHC: 33.2 g/dL (ref 30.0–36.0)
MCV: 92 fL (ref 78.0–100.0)
PLATELETS: 142 10*3/uL — AB (ref 150–400)
RBC: 3.37 MIL/uL — ABNORMAL LOW (ref 3.87–5.11)
RDW: 13.7 % (ref 11.5–15.5)
WBC: 4 10*3/uL (ref 4.0–10.5)

## 2014-09-18 LAB — BASIC METABOLIC PANEL
ANION GAP: 7 (ref 5–15)
BUN: 9 mg/dL (ref 6–23)
CHLORIDE: 109 mmol/L (ref 96–112)
CO2: 25 mmol/L (ref 19–32)
Calcium: 9.7 mg/dL (ref 8.4–10.5)
Creatinine, Ser: 1.12 mg/dL — ABNORMAL HIGH (ref 0.50–1.10)
GFR calc Af Amer: 52 mL/min — ABNORMAL LOW (ref >90)
GFR, EST NON AFRICAN AMERICAN: 45 mL/min — AB (ref >90)
Glucose, Bld: 112 mg/dL — ABNORMAL HIGH (ref 70–99)
Potassium: 3.6 mmol/L (ref 3.5–5.1)
SODIUM: 141 mmol/L (ref 135–145)

## 2014-09-18 MED ORDER — AMLODIPINE BESYLATE 2.5 MG PO TABS: 2.5000 mg | ORAL_TABLET | Freq: Every day | ORAL | Status: DC

## 2014-09-18 MED ORDER — ISOSORBIDE MONONITRATE ER 30 MG PO TB24
30.0000 mg | ORAL_TABLET | Freq: Every day | ORAL | Status: DC
  Administered 2014-09-18: 30 mg via ORAL
  Filled 2014-09-18: qty 1

## 2014-09-18 MED ORDER — ASPIRIN 81 MG PO TBEC: 81.0000 mg | DELAYED_RELEASE_TABLET | Freq: Every day | ORAL | Status: DC

## 2014-09-18 MED ORDER — AMLODIPINE BESYLATE 2.5 MG PO TABS
2.5000 mg | ORAL_TABLET | Freq: Every day | ORAL | Status: DC
  Administered 2014-09-18: 2.5 mg via ORAL
  Filled 2014-09-18: qty 1

## 2014-09-18 MED ORDER — ISOSORBIDE MONONITRATE ER 30 MG PO TB24: 30.0000 mg | ORAL_TABLET | Freq: Every day | ORAL | Status: DC

## 2014-09-18 NOTE — Telephone Encounter (Signed)
New message      TCM appt on 09-24-14 per Ignacia Bayley.

## 2014-09-18 NOTE — Discharge Summary (Signed)
Discharge Summary   Patient ID: Jacqueline [name],  MRN: [id number], DOB/AGE: 01-May-1931 81 y.o.  Admit date: 09/15/2014 Discharge date: 09/18/2014  Primary Care Provider: Zigmund Gottron Primary Cardiologist: Jerilynn Mages. Croitoru, MD   Discharge Diagnoses Principal Problem:   Unstable angina  **S/P Catheterization this admission revealing stable anatomy w/ 1/2 patent grafts Medical Rx.  Active Problems:   Coronary atherosclerosis   HYPERTENSION, BENIGN SYSTEMIC   Chronic kidney disease, stage 2, mildly decreased GFR   A-fib (chronic eliquis)   HYPERCHOLESTEROLEMIA   GERD (gastroesophageal reflux disease)   S/P right TKA removal and spacer placement  Allergies Allergies  Allergen Reactions  . Atorvastatin Other (See Comments)    Myalgias in legs  . Crestor [Rosuvastatin] Other (See Comments)    Myalgias in legs  . Morphine And Related Other (See Comments)    "Crazy thoughts, severe headache, sick"   Procedures  2D Echocardiogram 2.22.2016  Study Conclusions  - Left ventricle: The cavity size was normal. Wall thickness was   increased in a pattern of mild LVH. Systolic function was normal.   The estimated ejection fraction was in the range of 55% to 60%.   Wall motion was normal; there were no regional wall motion   abnormalities. Features are consistent with a pseudonormal left   ventricular filling pattern, with concomitant abnormal relaxation   and increased filling pressure (grade 2 diastolic dysfunction).   Doppler parameters are consistent with high ventricular filling   pressure. - Aortic valve: There was trivial regurgitation. - Mitral valve: There was mild regurgitation. - Left atrium: The atrium was mildly dilated. _____________   Cardiac Catheterization 2.22.2016  HEMODYNAMICS:    Central Aorta: 180/71    Left Ventricle: 180/13  ANGIOGRAPHY:  Left main:  Angiographically normal vessel which bifurcated into the LAD and left circumflex coronary  artery. LAD:  Moderate size vessel that gave rise to a proximal first diagonal vessel.  There was a 90% stenosis  Region of the septal perforating artery immediately proximal to the second diagonal vessel and there was evidence for filling of the LAD from this diagonal vessel which was supplied by the vein graft. Left circumflex:  Moderate size angiographically normal vessel which gave rise to a major marginal branch.  Right coronary artery:  Angiographically normal dominant vessel which gave rise to the PDA and PLA vessels LIMA to LAD:  Atretic and did not reach the LAD SVG to  Diagonal 2 vessel was widely patent. There was retrograde filling of the entire LAD system with brisk TIMI-3.  The diagonal inserted into the LAD immediately distal to the 90% LAD  Stenosis. Left ventriculography revealed  Reserved global LV contractility with an ejection fraction of 55-60%.  There is very minimal lateral hypocontractility. Aortic arch aortagraphy identified the takeoff of the great vessels.  The left subclavian was visualized  IMPRESSION:  Normal LV function with very small region of subtle focalmild anterolateral hypocontractility. Single vessel coronary obstructive disease with 90% stenosis in the LAD Lee proximal to the takeoff of the second diagonal vessel with filling of the LAD  Both antegrade as well as via the vein graft supplying the diagonal 2 vessel. Normal left circumflex and RCA vessels.  RECOMMENDATION:  Medical therapy.   _____________   History of Present Illness  81 year old female with the above complex problem list. She is status post coronary artery bypass grafting 2, approximately 10 years ago and has since been found to have an atretic LIMA to the LAD. She also  has a history of atrial fibrillation and is status post TEE and cardioversion in August 2015. She is on chronic oral anticoagulation with Eliquis.  she was in her usual state of health until the morning of February 20,  when she awoke with chest discomfort that was unrelieved with nitroglycerin. She also noted tachypalpitations. She presented to the [facility] emergency department where she continued to have chest tightness. Initial troponin was normal. ECG was nonacute.   She was admitted for further evaluation.   Hospital Course  Following admission, she was noted to have mild elevation of her troponin to a peak of 0.08. She continued to have mild chest discomfort. Her eliquis was held and she was placed on heparin in preparation for diagnostic catheterization. 2-D echocardiogram was performed and showed normal LV function. On February 22, she underwent diagnostic cardiac catheterization revealing stable native anatomy with known severe disease in the LAD and otherwise normal circumflex and RCA. The LIMA to the LAD was atretic while the vein graft to the second diagonal was widely patent with retrograde filling of the entire LAD system. Continued medical therapy was recommended. Postprocedure, [name] has been ambulating without recurrent symptoms. She will be discharged home today in good condition with early follow-up arranged for next week.  Discharge Vitals Blood pressure 157/97, pulse 81, temperature 98.7 F (37.1 C), temperature source Oral, resp. rate 20, height 5\' 1"  (1.549 m), weight 138 lb 3.7 oz (62.7 kg), SpO2 99 %.  Filed Weights   09/15/14 1644 09/18/14 0032  Weight: 137 lb 9.1 oz (62.4 kg) 138 lb 3.7 oz (62.7 kg)   Labs  CBC  Recent Labs  09/15/14 1758  09/17/14 09/18/14 0426  WBC 4.3  < > 3.8* 4.0  NEUTROABS 2.3  --   --   --   HGB 10.9*  < > 10.1* 10.3*  HCT 33.1*  < > 30.1* 31.0*  MCV 90.9  < > 90.1 92.0  PLT 162  < > 148* 142*  < > = values in this interval not displayed. Basic Metabolic Panel  Recent Labs  09/15/14 1758 09/16/14 0423 09/18/14 0825  NA 137 140 141  K 3.2* 3.7 3.6  CL 106 112 109  CO2 22 21 25   GLUCOSE 90 91 112*  BUN 16 15 9   CREATININE 1.28* 1.28*  1.12*  CALCIUM 8.8 9.0 9.7  MG 1.6  --   --    Liver Function Tests  Recent Labs  09/15/14 1640 09/15/14 1758  AST 26 25  ALT 21 20  ALKPHOS 51 50  BILITOT 0.5 0.3  PROT 6.5 5.9*  ALBUMIN 3.6 3.4*    Recent Labs  09/15/14 1640  LIPASE 27  AMYLASE 42   Cardiac Enzymes  Recent Labs  09/15/14 1758 09/15/14 2220 09/16/14 0423  TROPONINI 0.06* 0.08* 0.06*   Fasting Lipid Panel  Recent Labs  09/16/14 0423  CHOL 132  HDL 33*  LDLCALC 69  TRIG 148  CHOLHDL 4.0   Thyroid Function Tests  Recent Labs  09/15/14 1758  TSH 2.946    Disposition  Pt is being discharged home today in good condition.  Physical Examination on Discharge: BP 157/97 mmHg  Pulse 81  Temp(Src) 98.7 F (37.1 C) (Oral)  Resp 20  Ht 5\' 1"  (1.549 m)  Wt 138 lb 3.7 oz (62.7 kg)  BMI 26.13 kg/m2  SpO2 99% General: Well appearing. No resp difficulty HEENT: normal Neck: supple. JVP 5 . Carotids 2+ bilat; no bruits.  No lymphadenopathy or thryomegaly appreciated. Cor: PMI nondisplaced. Regular rate & rhythm. No rubs, gallops or murmurs. Lungs: clear Abdomen: soft, nontender, nondistended. No hepatosplenomegaly. No bruits or masses. Good bowel sounds. Extremities: no cyanosis, clubbing, rash, edema; right groin cath site C/D/I - no bruit or hematoma Neuro: alert & orientedx3, cranial nerves grossly intact. moves all 4 extremities w/o difficulty. Affect pleasant   Follow-up Plans & Appointments      Follow-up Information    Follow up with Murray Hodgkins, NP On 09/24/2014.   Specialty:  Nurse Practitioner   Why:  9:00 AM - Dr. Victorino December NP   Contact information:   [address] [place] [address] [phone]       Discharge Medications    Medication List    STOP taking these medications        hydrochlorothiazide 12.5 MG capsule  Commonly known as:  MICROZIDE      TAKE these medications        alendronate 70 MG tablet  Commonly known as:   FOSAMAX  Take 1 tablet (70 mg total) by mouth once a week. Take with a full glass of water on an empty stomach, remain upright     ALPRAZolam 0.5 MG tablet  Commonly known as:  XANAX  Take 1 tablet (0.5 mg total) by mouth 2 (two) times daily.     amLODipine 2.5 MG tablet  Commonly known as:  NORVASC  Take 1 tablet (2.5 mg total) by mouth daily.     apixaban 5 MG Tabs tablet  Commonly known as:  ELIQUIS  Take 1 tablet (5 mg total) by mouth 2 (two) times daily.     aspirin 81 MG EC tablet  Take 1 tablet (81 mg total) by mouth daily.     diphenhydrAMINE 25 MG tablet  Commonly known as:  BENADRYL  Take 75 mg by mouth 2 (two) times daily as needed for allergies (eye allergies).     enalapril 10 MG tablet  Commonly known as:  VASOTEC  TAKE 1 BY MOUTH TWICE DAILY     ferrous sulfate 325 (65 FE) MG tablet  Take 1 tablet (325 mg total) by mouth 3 (three) times daily after meals.     Fish Oil 1000 MG Caps  Take 3 capsules by mouth daily. Per Cardiologist     isosorbide mononitrate 30 MG 24 hr tablet  Commonly known as:  IMDUR  Take 1 tablet (30 mg total) by mouth daily.     loperamide 2 MG capsule  Commonly known as:  IMODIUM  Take 1 capsule (2 mg total) by mouth as needed for diarrhea or loose stools.     metoprolol tartrate 25 MG tablet  Commonly known as:  LOPRESSOR  Take 1 tablet (25 mg total) by mouth 2 (two) times daily.     nitroGLYCERIN 0.4 MG SL tablet  Commonly known as:  NITROSTAT  Place 1 tablet (0.4 mg total) under the tongue every 5 (five) minutes as needed for chest pain.     omeprazole 20 MG capsule  Commonly known as:  PRILOSEC  Take 1 capsule (20 mg total) by mouth daily.     oxyCODONE 5 MG immediate release tablet  Commonly known as:  Oxy IR/ROXICODONE  Take 1 tablet (5 mg total) by mouth every 6 (six) hours as needed for moderate pain or severe pain.     polyethylene glycol packet  Commonly known as:  MIRALAX / GLYCOLAX  Take 17  g by mouth daily as  needed for moderate constipation (couple times weekly, depending).     REFRESH OP  Apply 1 drop to eye 3 (three) times daily as needed (dry eyes).     rOPINIRole 0.25 MG tablet  Commonly known as:  REQUIP  Take 1 tablet (0.25 mg total) by mouth 3 (three) times daily.     simvastatin 40 MG tablet  Commonly known as:  ZOCOR  Take 1 tablet (40 mg total) by mouth daily at 6 PM.     tiZANidine 4 MG tablet  Commonly known as:  ZANAFLEX  Take 1 tablet (4 mg total) by mouth at bedtime.     traMADol 50 MG tablet  Commonly known as:  ULTRAM  TAKE 1 TABLET BY MOUTH TWICE A DAY     traZODone 50 MG tablet  Commonly known as:  DESYREL  Take 1 tablet (50 mg total) by mouth at bedtime.     vitamin B-12 100 MCG tablet  Commonly known as:  CYANOCOBALAMIN  Take 2,000 mcg by mouth 2 (two) times daily.     Vitamin D-3 1000 UNITS Caps  Take 2,000 Units by mouth 2 (two) times daily.       Outstanding Labs/Studies  None  Duration of Discharge Encounter   Greater than 30 minutes including physician time.  Signed, Murray Hodgkins NP 09/18/2014, 9:50 AM   I have seen and evaluated the patient this morning along with Mr. Sharolyn Douglas, NP. We've discussed the findings from a cardiac catheterization and recommendations. I agree with his summer as noted above. For medical management of existing CAD, we will add amlodipine and Imdur. I would anticipate potentially being up titrate amlodipine higher on the outpatient setting. Will defer to primary cardiologist.   Leonie Man, M.D., M.S. Interventional Cardiologist   Pager # [phone]

## 2014-09-18 NOTE — Discharge Instructions (Signed)
**  PLEASE REMEMBER TO BRING ALL OF YOUR MEDICATIONS TO EACH OF YOUR FOLLOW-UP OFFICE VISITS. ° °NO HEAVY LIFTING X 2 WEEKS. °NO SEXUAL ACTIVITY X 2 WEEKS. °NO DRIVING X 1 WEEK. °NO SOAKING BATHS, HOT TUBS, POOLS, ETC., X 7 DAYS. ° °Groin Site Care °Refer to this sheet in the next few weeks. These instructions provide you with information on caring for yourself after your procedure. Your caregiver may also give you more specific instructions. Your treatment has been planned according to current medical practices, but problems sometimes occur. Call your caregiver if you have any problems or questions after your procedure. °HOME CARE INSTRUCTIONS °· You may shower 24 hours after the procedure. Remove the bandage (dressing) and gently wash the site with plain soap and water. Gently pat the site dry.  °· Do not apply powder or lotion to the site.  °· Do not sit in a bathtub, swimming pool, or whirlpool for 5 to 7 days.  °· No bending, squatting, or lifting anything over 10 pounds (4.5 kg) as directed by your caregiver.  °· Inspect the site at least twice daily.  °· Do not drive home if you are discharged the same day of the procedure. Have someone else drive you.  ° °What to expect: °· Any bruising will usually fade within 1 to 2 weeks.  °· Blood that collects in the tissue (hematoma) may be painful to the touch. It should usually decrease in size and tenderness within 1 to 2 weeks.  °SEEK IMMEDIATE MEDICAL CARE IF: °· You have unusual pain at the groin site or down the affected leg.  °· You have redness, warmth, swelling, or pain at the groin site.  °· You have drainage (other than a small amount of blood on the dressing).  °· You have chills.  °· You have a fever or persistent symptoms for more than 72 hours.  °· You have a fever and your symptoms suddenly get worse.  °· Your leg becomes pale, cool, tingly, or numb.  °You have heavy bleeding from the site. Hold pressure on the site. . °  °

## 2014-09-19 ENCOUNTER — Telehealth: Payer: Self-pay | Admitting: Cardiovascular Disease

## 2014-09-19 NOTE — Telephone Encounter (Signed)
Patient informed of information per Dr. Debara Pickett. She voiced understanding and thanked Therapist, sports for seeking clarification on her medications. Medication list updated.

## 2014-09-19 NOTE — Telephone Encounter (Signed)
Follow Up  Pt was told to stop aspirin, but wants to know if she should continue metroplol and menalopril (BP med). Please call back and discuss.

## 2014-09-19 NOTE — Telephone Encounter (Signed)
Pt called in stating that she just had a catheterization done on 2/20 and she is now complaining of some left shoulder pain. She wanted to know if this is normal if possibly she slept on her shoulder wrong. Please f/u with pt  Thanks

## 2014-09-19 NOTE — Telephone Encounter (Signed)
Spoke with patient. She states she is having left shoulder blade pain that is reproducible with movement. She noticed it this AM when she woke up. She has not had this previously. I told her it sounds like musculoskeletal pain or that she slept funny on her arm/shoulder, but would seek advice from Dr. [name] to be certain. She has NO other complaints.   She inquired about being on both Eliquis & aspirin and would like clarification of this as she mentioned she had been take on off Eliquis and since resumed.

## 2014-09-19 NOTE — Telephone Encounter (Signed)
Agree pain sounds most likely musculoskeletal. Stop aspirin. Continue Eliquis

## 2014-09-19 NOTE — Telephone Encounter (Signed)
Patient contacted regarding discharge from [facility] on September 18, 2014.  Patient understands to follow up with provider Ignacia Bayley, NP on September 24, 2014 at 9:00AM at [facility]. Patient understands discharge instructions? Yes Patient understands medications and regiment? yes Patient understands to bring all medications to this visit? yes

## 2014-09-19 NOTE — Telephone Encounter (Signed)
Discussed pt's updated med list at discharge. Advised to take all medications as prescribed. Discussed how she is taking amlodipine, metoprolol, imdur, and enalapril. She elicited correct dosing and frequency. She was not aware of receiving a discharge summary. Communicated that I would send updated list for her review. I reprinted hospital AVS and put in outgoing mail for her. Pt expressed thanks and understanding of instructions given.

## 2014-09-24 ENCOUNTER — Ambulatory Visit (INDEPENDENT_AMBULATORY_CARE_PROVIDER_SITE_OTHER): Payer: PPO | Admitting: Nurse Practitioner

## 2014-09-24 ENCOUNTER — Encounter: Payer: Self-pay | Admitting: Nurse Practitioner

## 2014-09-24 VITALS — BP 128/70 | HR 65 | Ht 61.0 in | Wt 140.4 lb

## 2014-09-24 DIAGNOSIS — Z89529 Acquired absence of unspecified knee: Secondary | ICD-10-CM

## 2014-09-24 DIAGNOSIS — E7800 Pure hypercholesterolemia, unspecified: Secondary | ICD-10-CM

## 2014-09-24 DIAGNOSIS — Z899 Acquired absence of limb, unspecified: Secondary | ICD-10-CM

## 2014-09-24 DIAGNOSIS — I2510 Atherosclerotic heart disease of native coronary artery without angina pectoris: Secondary | ICD-10-CM

## 2014-09-24 DIAGNOSIS — I10 Essential (primary) hypertension: Secondary | ICD-10-CM

## 2014-09-24 DIAGNOSIS — I480 Paroxysmal atrial fibrillation: Secondary | ICD-10-CM

## 2014-09-24 NOTE — Progress Notes (Signed)
Patient Name: Jacqueline [name] Date of Encounter: 09/24/2014  Primary Care Provider:  Zigmund Gottron, MD Primary Cardiologist:  Jerilynn Mages. Croitoru, MD   Chief Complaint  81 year old female status post recent admission for chest pain and mild troponin elevation who presents for hospital follow-up.  Past Medical History   Past Medical History  Diagnosis Date  . Hypertension   . CAD (coronary artery disease)     a. s/p CABG x 2 (LIMA->LAD, VG->Diag);  b. 08/2014 Cath: LM nl, LAD 90p, LCX nl, RCA nl/dominant, LIMA->LAD atretic, VG->D2 patent w/ retrograde filling of LAD, EF 55-60%-->Med Rx.  Marland Kitchen Hyperlipemia   . Insomnia   . Diverticulitis 02/21/2012  . GERD (gastroesophageal reflux disease)   . Atrial fibrillation with rapid ventricular response 02/2014    a. CHA2DS2VASc = 6 -> on eliquis;  b. 02/2014 s/p DCCV;  c. 08/2014 Echo: EF 55-60%, Gr 2 DD, mild MR, triv AI.  Marland Kitchen Arthritis     back   Past Surgical History  Procedure Laterality Date  . Coronary artery bypass graft  2006    off-pump bypass surgery with LIMA to the LAD and SVG to the second diagonal artery ( performed by Dr Servando Snare)   . Tee without cardioversion N/A 02/28/2014    Procedure: TRANSESOPHAGEAL ECHOCARDIOGRAM (TEE);  Surgeon: Sanda Klein, MD;  Location: [facility] ENDOSCOPY;  Service: Cardiovascular;  Laterality: N/A;  . Cardioversion N/A 02/28/2014    Procedure: CARDIOVERSION;  Surgeon: Sanda Klein, MD;  Location: [facility] ENDOSCOPY;  Service: Cardiovascular;  Laterality: N/A;  . Cataract extraction Bilateral 2014  . Abdominal hysterectomy  1975  . Rotator cuff repair Bilateral     r-1999, l- 2005  . Joint replacement Bilateral     L-2004, R-2006  . Total knee arthroplasty Right 07/02/2014    Procedure: Resection of Infected Right Total Knee Arthroplasty with placement antibiotic spacer;  Surgeon: Mauri Pole, MD;  Location: [facility] ORS;  Service: Orthopedics;  Laterality: Right;  . Left heart catheterization with coronary/graft  angiogram N/A 09/17/2014    Procedure: LEFT HEART CATHETERIZATION WITH Beatrix Fetters;  Surgeon: Troy Sine, MD;  Location: [facility] CATH LAB;  Service: Cardiovascular;  Laterality: N/A;    Allergies  Allergies  Allergen Reactions  . Atorvastatin Other (See Comments)    Myalgias in legs  . Crestor [Rosuvastatin] Other (See Comments)    Myalgias in legs  . Morphine And Related Other (See Comments)    "Crazy thoughts, severe headache, sick"    HPI  81 year old female with prior history of coronary artery disease status post coronary artery bypass grafting. She also has a history of paroxysmal atrial fibrillation and has been maintained on eliquis therapy since August 2015. She was recently admitted to [facility] secondary to nitrate responsive chest pain. She was found to have a mild elevation of her troponin and decision was made to pursue diagnostic catheterization. She was found to have stable coronary anatomy with an atretic LIMA to the LAD and widely patent vein graft to the second diagonal with retrograde filling of the entire LAD. The circumflex and right coronary artery were normal. Continued medical therapy was recommended. In the setting of ongoing hypertension, she was placed on low-dose amlodipine and isosorbide therapy. Since her discharge, she has done well without recurrent chest pain or dyspnea. She is tolerating her medications well. She denies PND, orthopnea, dizziness, syncope, edema, or early satiety. Right groin catheterization site has healed well. She continues to have right knee pain and is  tenderly scheduled for surgery and removal of spacer in mid-March.  Home Medications  Prior to Admission medications   Medication Sig Start Date End Date Taking? Authorizing Provider  acetaminophen (TYLENOL) 500 MG tablet Take 500-1,000 mg by mouth every 6 (six) hours as needed for moderate pain or headache.   Yes Historical Provider, MD  alendronate (FOSAMAX) 70 MG tablet  Take 1 tablet (70 mg total) by mouth once a week. Take with a full glass of water on an empty stomach, remain upright Patient taking differently: Take 70 mg by mouth once a week. Saturday 03/22/14  Yes Zigmund Gottron, MD  ALPRAZolam Duanne Moron) 0.5 MG tablet Take 1 tablet (0.5 mg total) by mouth 2 (two) times daily. 05/21/14  Yes Zigmund Gottron, MD  amLODipine (NORVASC) 2.5 MG tablet Take 1 tablet (2.5 mg total) by mouth daily. 09/18/14  Yes Rogelia Mire, NP  apixaban (ELIQUIS) 5 MG TABS tablet Take 1 tablet (5 mg total) by mouth 2 (two) times daily. 02/28/14  Yes Rhonda G Barrett, PA-C  Cholecalciferol (VITAMIN D-3) 1000 UNITS CAPS Take 2,000 Units by mouth 2 (two) times daily.   Yes Historical Provider, MD  diphenhydrAMINE (BENADRYL) 25 MG tablet Take 50 mg by mouth 2 (two) times daily as needed for allergies (eye allergies).    Yes Historical Provider, MD  enalapril (VASOTEC) 10 MG tablet TAKE 1 BY MOUTH TWICE DAILY 07/09/14  Yes Zigmund Gottron, MD  ferrous sulfate 325 (65 FE) MG tablet Take 1 tablet (325 mg total) by mouth 3 (three) times daily after meals. 07/05/14  Yes Lucille Passy Babish, PA-C  isosorbide mononitrate (IMDUR) 30 MG 24 hr tablet Take 1 tablet (30 mg total) by mouth daily. 09/18/14  Yes Rogelia Mire, NP  loperamide (IMODIUM) 2 MG capsule Take 1 capsule (2 mg total) by mouth as needed for diarrhea or loose stools. 07/05/14  Yes Lucille Passy Babish, PA-C  metoprolol tartrate (LOPRESSOR) 25 MG tablet Take 1 tablet (25 mg total) by mouth 2 (two) times daily. 06/04/14  Yes Zigmund Gottron, MD  nitroGLYCERIN (NITROSTAT) 0.4 MG SL tablet Place 1 tablet (0.4 mg total) under the tongue every 5 (five) minutes as needed for chest pain. 03/21/14  Yes Mihai Croitoru, MD  Omega-3 Fatty Acids (FISH OIL) 1000 MG CAPS Take 1-2 capsules by mouth 2 (two) times daily. Takes one in the morning and two capsules at night.   Yes Historical Provider, MD  omeprazole  (PRILOSEC) 20 MG capsule Take 1 capsule (20 mg total) by mouth daily. Patient taking differently: Take 20 mg by mouth every morning.  05/21/14  Yes Zigmund Gottron, MD  oxyCODONE (OXY IR/ROXICODONE) 5 MG immediate release tablet Take 1 tablet (5 mg total) by mouth every 6 (six) hours as needed for moderate pain or severe pain. 08/10/14  Yes Zigmund Gottron, MD  polyethylene glycol [facility] / Floria Raveling) packet Take 17 g by mouth 3 (three) times a week.   Yes Historical Provider, MD  Polyvinyl Alcohol-Povidone (REFRESH OP) Apply 1 drop to eye 3 (three) times daily as needed (dry eyes).   Yes Historical Provider, MD  rOPINIRole (REQUIP) 0.25 MG tablet Take 1 tablet (0.25 mg total) by mouth 3 (three) times daily. 06/04/14  Yes Zigmund Gottron, MD  simvastatin (ZOCOR) 40 MG tablet Take 1 tablet (40 mg total) by mouth daily at 6 PM. Patient taking differently: Take 40 mg by mouth at bedtime.  05/21/14  Yes Zigmund Gottron, MD  tiZANidine (ZANAFLEX) 4 MG tablet Take 1 tablet (4 mg total) by mouth at bedtime. 08/10/14  Yes Zigmund Gottron, MD  traMADol (ULTRAM) 50 MG tablet TAKE 1 TABLET BY MOUTH TWICE A DAY 09/17/14  Yes Zigmund Gottron, MD  traZODone (DESYREL) 50 MG tablet Take 1 tablet (50 mg total) by mouth at bedtime. 05/21/14  Yes Zigmund Gottron, MD  vitamin B-12 (CYANOCOBALAMIN) 100 MCG tablet Take 2,000 mcg by mouth 2 (two) times daily.   Yes Historical Provider, MD    Review of Systems  As above, she is mostly been doing very well. She does have chronic right knee pain and is recently status post surgery and pending spacer removal in mid-March.  She denies chest pain, palpitations, dyspnea, pnd, orthopnea, n, v, dizziness, syncope, edema, weight gain, or early satiety.  All other systems reviewed and are otherwise negative except as noted above.  Physical Exam  VS:  BP 128/70 mmHg  Pulse 65  Ht 5\' 1"  (1.549 m)  Wt 140 lb 6.4 oz (63.685 kg)  BMI 26.54  kg/m2 , BMI Body mass index is 26.54 kg/(m^2). GEN: Well nourished, well developed, in no acute distress. HEENT: normal. Neck: Supple, no JVD, carotid bruits, or masses. Cardiac: RRR, no murmurs, rubs, or gallops. No clubbing, cyanosis, edema.  Radials/DP/PT 2+ and equal bilaterally.  Respiratory:  Respirations regular and unlabored, clear to auscultation bilaterally. GI: Soft, nontender, nondistended, BS + x 4. MS: no deformity or atrophy. Skin: warm and dry, no rash. Neuro:  Strength and sensation are intact. Psych: Normal affect.  Accessory Clinical Findings  ECG -regular sinus rhythm, 65, no acute ST or T changes.  Assessment & Plan  1.  Coronary artery disease: Patient status post recent admission secondary to unstable angina with very minor troponin leak to a peak of 0.08. Catheterization revealed stable anatomy with severe disease in the LAD, an atretic LIMA to the LAD, a patent vein graft to the diagonal with retrograde filling of the LAD, and normal RCA and circumflex. LV function was normal by echocardiography during that admission. She is done well post discharge and is tolerated calcium channel blocker and nitrate therapy well. She otherwise remains on statin and beta blocker therapy. As she is on chronic eliquis, she is not currently on aspirin.  2. Hypertension: Stable on beta blocker, calcium channel blocker, ACE inhibitor, and nitrate.  3. Hyperlipidemia: LDL 69 during most recent admission with normal AST and ALT. Continue simvastatin therapy.  4. Paroxysmal atrial fibrillation: She is in sinus rhythm today. CHA2DS2VASc = 6.  She is on eliquis.  5. Status post right total knee arthroplasty: Patient is status post surgery in December with antibiotic spacer placement at that time. She is scheduled to have this removed in mid-March. As she has had no prior history of CVA, she will be able to come off of eliquis prior to surgery and will not require bridging.  6. Disposition:  Follow-up with Dr. [name] in 2-3 mos or sooner if necessary.  We will be available to assist in her post-operative care @ [facility] following R knee surgery if needed.   Murray Hodgkins, NP 09/24/2014, 2:07 PM

## 2014-09-24 NOTE — Progress Notes (Signed)
Thank you, Chris! MCr 

## 2014-09-24 NOTE — Patient Instructions (Signed)
Your physician recommends that you continue on your current medications as directed. Please refer to the Current Medication list given to you today.  Your physician recommends that you schedule a follow-up appointment in: 2 months with Dr.Croitoru

## 2014-09-28 NOTE — Patient Instructions (Addendum)
ROULA LAURENS  09/28/2014   Your procedure is scheduled on: 10/08/2014    Report to [facility] Main  Entrance and follow signs to               [place] at  2:00pm  Call this number if you have problems the morning of surgery [phone]   Remember:  Do not eat food after midnight.  May have clear liquids until 1000am then nothing by mouth.       Take these medicines the morning of surgery with A SIP OF WATER: amlodipine, alprazolam, isosorbide mononitrate, metoprolol, omeprazole                               You may not have any metal on your body including hair pins and              piercings  Do not wear jewelry, make-up, lotions, powders or perfumes., deodorant.               Do not wear nail polish.  Do not shave  48 hours prior to surgery.   Do not bring valuables to the hospital. [place].  Contacts, dentures or bridgework may not be worn into surgery.  Leave suitcase in the car. After surgery it may be brought to your room.                 Please read over the following fact sheets you were given: MRSA information _____________________________________________________________________                CLEAR LIQUID DIET   Foods Allowed                                                                     Foods Excluded  Coffee and tea, regular and decaf                             liquids that you cannot  Plain Jell-O in any flavor                                             see through such as: Fruit ices (not with fruit pulp)                                     milk, soups, orange juice  Iced Popsicles                                    All solid food Carbonated beverages, regular and diet  Cranberry, grape and apple juices Sports drinks like Gatorade Lightly seasoned clear broth or consume(fat free) Sugar, honey syrup  Sample Menu Breakfast                                 Lunch                                     Supper Cranberry juice                    Beef broth                            Chicken broth Jell-O                                     Grape juice                           Apple juice Coffee or tea                        Jell-O                                      Popsicle                                                Coffee or tea                        Coffee or tea  _____________________________________________________________________  [facility] - Preparing for Surgery Before surgery, you can play an important role.  Because skin is not sterile, your skin needs to be as free of germs as possible.  You can reduce the number of germs on your skin by washing with CHG (chlorahexidine gluconate) soap before surgery.  CHG is an antiseptic cleaner which kills germs and bonds with the skin to continue killing germs even after washing. Please DO NOT use if you have an allergy to CHG or antibacterial soaps.  If your skin becomes reddened/irritated stop using the CHG and inform your nurse when you arrive at Short Stay. Do not shave (including legs and underarms) for at least 48 hours prior to the first CHG shower.  You may shave your face/neck. Please follow these instructions carefully:  1.  Shower with CHG Soap the night before surgery and the  morning of Surgery.  2.  If you choose to wash your hair, wash your hair first as usual with your  normal  shampoo.  3.  After you shampoo, rinse your hair and body thoroughly to remove the  shampoo.                            4.  Use CHG as you would any other liquid soap.  You can apply chg directly  to the skin and wash  Gently with a scrungie or clean washcloth.  5.  Apply the CHG Soap to your body ONLY FROM THE NECK DOWN.   Do not use on face/ open                           Wound or open sores. Avoid contact with eyes, ears mouth and genitals (private parts).                        Wash face,  Genitals (private parts) with your normal soap.             6.  Wash thoroughly, paying special attention to the area where your surgery  will be performed.  7.  Thoroughly rinse your body with warm water from the neck down.  8.  DO NOT shower/wash with your normal soap after using and rinsing off  the CHG Soap.                9.  Pat yourself dry with a clean towel.            10.  Wear clean pajamas.            11.  Place clean sheets on your bed the night of your first shower and do not  sleep with pets. Day of Surgery : Do not apply any lotions/deodorants the morning of surgery.  Please wear clean clothes to the hospital/surgery center.  FAILURE TO FOLLOW THESE INSTRUCTIONS MAY RESULT IN THE CANCELLATION OF YOUR SURGERY PATIENT SIGNATURE_________________________________  NURSE SIGNATURE__________________________________  ________________________________________________________________________  WHAT IS A BLOOD TRANSFUSION? Blood Transfusion Information  A transfusion is the replacement of blood or some of its parts. Blood is made up of multiple cells which provide different functions.  Red blood cells carry oxygen and are used for blood loss replacement.  White blood cells fight against infection.  Platelets control bleeding.  Plasma helps clot blood.  Other blood products are available for specialized needs, such as hemophilia or other clotting disorders. BEFORE THE TRANSFUSION  Who gives blood for transfusions?   Healthy volunteers who are fully evaluated to make sure their blood is safe. This is blood bank blood. Transfusion therapy is the safest it has ever been in the practice of medicine. Before blood is taken from a donor, a complete history is taken to make sure that person has no history of diseases nor engages in risky social behavior (examples are intravenous drug use or sexual activity with multiple partners). The donor's travel history is  screened to minimize risk of transmitting infections, such as malaria. The donated blood is tested for signs of infectious diseases, such as HIV and hepatitis. The blood is then tested to be sure it is compatible with you in order to minimize the chance of a transfusion reaction. If you or a relative donates blood, this is often done in anticipation of surgery and is not appropriate for emergency situations. It takes many days to process the donated blood. RISKS AND COMPLICATIONS Although transfusion therapy is very safe and saves many lives, the main dangers of transfusion include:  1. Getting an infectious disease. 2. Developing a transfusion reaction. This is an allergic reaction to something in the blood you were given. Every precaution is taken to prevent this. The decision to have a blood transfusion has been considered carefully by your caregiver before blood is given. Blood is not given unless the benefits  outweigh the risks. AFTER THE TRANSFUSION  Right after receiving a blood transfusion, you will usually feel much better and more energetic. This is especially true if your red blood cells have gotten low (anemic). The transfusion raises the level of the red blood cells which carry oxygen, and this usually causes an energy increase.  The nurse administering the transfusion will monitor you carefully for complications. HOME CARE INSTRUCTIONS  No special instructions are needed after a transfusion. You may find your energy is better. Speak with your caregiver about any limitations on activity for underlying diseases you may have. SEEK MEDICAL CARE IF:   Your condition is not improving after your transfusion.  You develop redness or irritation at the intravenous (IV) site. SEEK IMMEDIATE MEDICAL CARE IF:  Any of the following symptoms occur over the next 12 hours:  Shaking chills.  You have a temperature by mouth above 102 F (38.9 C), not controlled by medicine.  Chest, back, or  muscle pain.  People around you feel you are not acting correctly or are confused.  Shortness of breath or difficulty breathing.  Dizziness and fainting.  You get a rash or develop hives.  You have a decrease in urine output.  Your urine turns a dark color or changes to pink, red, or brown. Any of the following symptoms occur over the next 10 days:  You have a temperature by mouth above 102 F (38.9 C), not controlled by medicine.  Shortness of breath.  Weakness after normal activity.  The white part of the eye turns yellow (jaundice).  You have a decrease in the amount of urine or are urinating less often.  Your urine turns a dark color or changes to pink, red, or brown. Document Released: 07/10/2000 Document Revised: 10/05/2011 Document Reviewed: 02/27/2008 ExitCare Patient Information 2014 [place].  _______________________________________________________________________  Incentive Spirometer  An incentive spirometer is a tool that can help keep your lungs clear and active. This tool measures how well you are filling your lungs with each breath. Taking long deep breaths may help reverse or decrease the chance of developing breathing (pulmonary) problems (especially infection) following:  A long period of time when you are unable to move or be active. BEFORE THE PROCEDURE   If the spirometer includes an indicator to show your best effort, your nurse or respiratory therapist will set it to a desired goal.  If possible, sit up straight or lean slightly forward. Try not to slouch.  Hold the incentive spirometer in an upright position. INSTRUCTIONS FOR USE  3. Sit on the edge of your bed if possible, or sit up as far as you can in bed or on a chair. 4. Hold the incentive spirometer in an upright position. 5. Breathe out normally. 6. Place the mouthpiece in your mouth and seal your lips tightly around it. 7. Breathe in slowly and as deeply as possible, raising the  piston or the ball toward the top of the column. 8. Hold your breath for 3-5 seconds or for as long as possible. Allow the piston or ball to fall to the bottom of the column. 9. Remove the mouthpiece from your mouth and breathe out normally. 10. Rest for a few seconds and repeat Steps 1 through 7 at least 10 times every 1-2 hours when you are awake. Take your time and take a few normal breaths between deep breaths. 11. The spirometer may include an indicator to show your best effort. Use the indicator as a goal to  work toward during each repetition. 12. After each set of 10 deep breaths, practice coughing to be sure your lungs are clear. If you have an incision (the cut made at the time of surgery), support your incision when coughing by placing a pillow or rolled up towels firmly against it. Once you are able to get out of bed, walk around indoors and cough well. You may stop using the incentive spirometer when instructed by your caregiver.  RISKS AND COMPLICATIONS  Take your time so you do not get dizzy or light-headed.  If you are in pain, you may need to take or ask for pain medication before doing incentive spirometry. It is harder to take a deep breath if you are having pain. AFTER USE  Rest and breathe slowly and easily.  It can be helpful to keep track of a log of your progress. Your caregiver can provide you with a simple table to help with this. If you are using the spirometer at home, follow these instructions: [place] IF:   You are having difficultly using the spirometer.  You have trouble using the spirometer as often as instructed.  Your pain medication is not giving enough relief while using the spirometer.  You develop fever of 100.5 F (38.1 C) or higher. SEEK IMMEDIATE MEDICAL CARE IF:   You cough up bloody sputum that had not been present before.  You develop fever of 102 F (38.9 C) or greater.  You develop worsening pain at or near the incision  site. MAKE SURE YOU:   Understand these instructions.  Will watch your condition.  Will get help right away if you are not doing well or get worse. Document Released: 11/23/2006 Document Revised: 10/05/2011 Document Reviewed: 01/24/2007 [facility] Patient Information 2014 [place], [place].   ________________________________________________________________________

## 2014-10-01 ENCOUNTER — Encounter (HOSPITAL_COMMUNITY): Payer: Self-pay

## 2014-10-01 ENCOUNTER — Encounter (HOSPITAL_COMMUNITY)
Admission: RE | Admit: 2014-10-01 | Discharge: 2014-10-01 | Disposition: A | Discharge status: Home / Self Care | Payer: PPO | Source: Ambulatory Visit | Attending: Orthopedic Surgery | Admitting: Orthopedic Surgery

## 2014-10-01 DIAGNOSIS — Z01810 Encounter for preprocedural cardiovascular examination: Secondary | ICD-10-CM | POA: Insufficient documentation

## 2014-10-01 DIAGNOSIS — M179 Osteoarthritis of knee, unspecified: Secondary | ICD-10-CM | POA: Diagnosis not present

## 2014-10-01 DIAGNOSIS — Z01812 Encounter for preprocedural laboratory examination: Secondary | ICD-10-CM | POA: Diagnosis not present

## 2014-10-01 LAB — CBC
HEMATOCRIT: 33.8 % — AB (ref 36.0–46.0)
HEMOGLOBIN: 10.9 g/dL — AB (ref 12.0–15.0)
MCH: 30.2 pg (ref 26.0–34.0)
MCHC: 32.2 g/dL (ref 30.0–36.0)
MCV: 93.6 fL (ref 78.0–100.0)
Platelets: 198 10*3/uL (ref 150–400)
RBC: 3.61 MIL/uL — ABNORMAL LOW (ref 3.87–5.11)
RDW: 13.7 % (ref 11.5–15.5)
WBC: 3.8 10*3/uL — ABNORMAL LOW (ref 4.0–10.5)

## 2014-10-01 LAB — URINALYSIS, ROUTINE W REFLEX MICROSCOPIC
Bilirubin Urine: NEGATIVE
GLUCOSE, UA: NEGATIVE mg/dL
Hgb urine dipstick: NEGATIVE
Ketones, ur: NEGATIVE mg/dL
Nitrite: NEGATIVE
PH: 6 (ref 5.0–8.0)
PROTEIN: NEGATIVE mg/dL
Specific Gravity, Urine: 1.009 (ref 1.005–1.030)
Urobilinogen, UA: 0.2 mg/dL (ref 0.0–1.0)

## 2014-10-01 LAB — BASIC METABOLIC PANEL
Anion gap: 5 (ref 5–15)
BUN: 23 mg/dL (ref 6–23)
CALCIUM: 9.4 mg/dL (ref 8.4–10.5)
CHLORIDE: 107 mmol/L (ref 96–112)
CO2: 27 mmol/L (ref 19–32)
CREATININE: 1.33 mg/dL — AB (ref 0.50–1.10)
GFR calc non Af Amer: 36 mL/min — ABNORMAL LOW (ref >90)
GFR, EST AFRICAN AMERICAN: 42 mL/min — AB (ref >90)
Glucose, Bld: 99 mg/dL (ref 70–99)
Potassium: 4.2 mmol/L (ref 3.5–5.1)
Sodium: 139 mmol/L (ref 135–145)

## 2014-10-01 LAB — PROTIME-INR
INR: 1.28 (ref 0.00–1.49)
Prothrombin Time: 16.1 seconds — ABNORMAL HIGH (ref 11.6–15.2)

## 2014-10-01 LAB — APTT: aPTT: 43 seconds — ABNORMAL HIGH (ref 24–37)

## 2014-10-01 LAB — URINE MICROSCOPIC-ADD ON

## 2014-10-01 LAB — SURGICAL PCR SCREEN
MRSA, PCR: NEGATIVE
Staphylococcus aureus: NEGATIVE

## 2014-10-01 NOTE — Progress Notes (Signed)
EKG 09/24/14 on EPIC, ECHO 09/17/14 on EPIC, Chest x-ray 09/15/14 on EPIC, heart cath 09/17/14 on EPIC

## 2014-10-03 ENCOUNTER — Telehealth: Payer: Self-pay | Admitting: Cardiovascular Disease

## 2014-10-03 NOTE — Telephone Encounter (Signed)
I talked to [facility] at Dr. Honor Loh office and told her I would call Jacqueline [name] and get a definitive response about surgical clearance

## 2014-10-08 ENCOUNTER — Inpatient Hospital Stay (HOSPITAL_COMMUNITY): Payer: PPO | Admitting: Certified Registered Nurse Anesthetist

## 2014-10-08 ENCOUNTER — Inpatient Hospital Stay (HOSPITAL_COMMUNITY)
Admission: RE | Admit: 2014-10-08 | Discharge: 2014-10-11 | DRG: 940 | Disposition: A | Discharge status: Skilled Nursing Facility | Payer: PPO | Source: Ambulatory Visit | Attending: Orthopedic Surgery | Admitting: Orthopedic Surgery

## 2014-10-08 ENCOUNTER — Encounter (HOSPITAL_COMMUNITY): Payer: Self-pay | Admitting: *Deleted

## 2014-10-08 ENCOUNTER — Encounter (HOSPITAL_COMMUNITY)
Admission: RE | Disposition: A | Discharge status: Skilled Nursing Facility | Payer: Self-pay | Source: Ambulatory Visit | Attending: Orthopedic Surgery

## 2014-10-08 DIAGNOSIS — K219 Gastro-esophageal reflux disease without esophagitis: Secondary | ICD-10-CM | POA: Diagnosis present

## 2014-10-08 DIAGNOSIS — D62 Acute posthemorrhagic anemia: Secondary | ICD-10-CM | POA: Diagnosis not present

## 2014-10-08 DIAGNOSIS — Z9889 Other specified postprocedural states: Secondary | ICD-10-CM

## 2014-10-08 DIAGNOSIS — I10 Essential (primary) hypertension: Secondary | ICD-10-CM | POA: Diagnosis present

## 2014-10-08 DIAGNOSIS — E785 Hyperlipidemia, unspecified: Secondary | ICD-10-CM | POA: Diagnosis present

## 2014-10-08 DIAGNOSIS — I2510 Atherosclerotic heart disease of native coronary artery without angina pectoris: Secondary | ICD-10-CM | POA: Diagnosis present

## 2014-10-08 DIAGNOSIS — E663 Overweight: Secondary | ICD-10-CM | POA: Diagnosis present

## 2014-10-08 DIAGNOSIS — Y838 Other surgical procedures as the cause of abnormal reaction of the patient, or of later complication, without mention of misadventure at the time of the procedure: Secondary | ICD-10-CM | POA: Diagnosis present

## 2014-10-08 DIAGNOSIS — Z96651 Presence of right artificial knee joint: Secondary | ICD-10-CM

## 2014-10-08 DIAGNOSIS — Z6826 Body mass index (BMI) 26.0-26.9, adult: Secondary | ICD-10-CM

## 2014-10-08 DIAGNOSIS — Z951 Presence of aortocoronary bypass graft: Secondary | ICD-10-CM | POA: Diagnosis not present

## 2014-10-08 DIAGNOSIS — G4700 Insomnia, unspecified: Secondary | ICD-10-CM | POA: Diagnosis present

## 2014-10-08 DIAGNOSIS — T8453XD Infection and inflammatory reaction due to internal right knee prosthesis, subsequent encounter: Secondary | ICD-10-CM | POA: Diagnosis present

## 2014-10-08 DIAGNOSIS — M1388 Other specified arthritis, other site: Secondary | ICD-10-CM | POA: Diagnosis present

## 2014-10-08 DIAGNOSIS — I4891 Unspecified atrial fibrillation: Secondary | ICD-10-CM | POA: Diagnosis present

## 2014-10-08 DIAGNOSIS — Z96659 Presence of unspecified artificial knee joint: Secondary | ICD-10-CM

## 2014-10-08 DIAGNOSIS — D500 Iron deficiency anemia secondary to blood loss (chronic): Secondary | ICD-10-CM | POA: Diagnosis not present

## 2014-10-08 HISTORY — PX: REIMPLANTATION OF TOTAL KNEE: SHX6052

## 2014-10-08 LAB — PROTIME-INR
INR: 1.04 (ref 0.00–1.49)
Prothrombin Time: 13.7 seconds (ref 11.6–15.2)

## 2014-10-08 SURGERY — REVISION, TOTAL ARTHROPLASTY, KNEE
Anesthesia: General | Site: Knee | Laterality: Right

## 2014-10-08 MED ORDER — ACETAMINOPHEN 500 MG PO TABS: 500.0000 mg | ORAL_TABLET | Freq: Four times a day (QID) | ORAL | Status: DC | PRN

## 2014-10-08 MED ORDER — HYDROMORPHONE HCL 1 MG/ML IJ SOLN
INTRAMUSCULAR | Status: AC
  Filled 2014-10-08: qty 1

## 2014-10-08 MED ORDER — POLYETHYLENE GLYCOL 3350 17 G PO PACK
17.0000 g | PACK | Freq: Two times a day (BID) | ORAL | Status: DC
  Administered 2014-10-09 – 2014-10-10 (×2): 17 g via ORAL

## 2014-10-08 MED ORDER — MEPERIDINE HCL 50 MG/ML IJ SOLN: 6.2500 mg | INTRAMUSCULAR | Status: DC | PRN

## 2014-10-08 MED ORDER — ALPRAZOLAM 0.5 MG PO TABS
0.5000 mg | ORAL_TABLET | Freq: Two times a day (BID) | ORAL | Status: DC
  Administered 2014-10-08 – 2014-10-10 (×5): 0.5 mg via ORAL
  Filled 2014-10-08 (×5): qty 1

## 2014-10-08 MED ORDER — MIDAZOLAM HCL 2 MG/2ML IJ SOLN
INTRAMUSCULAR | Status: AC
  Filled 2014-10-08: qty 2

## 2014-10-08 MED ORDER — NITROGLYCERIN 0.4 MG SL SUBL: 0.4000 mg | SUBLINGUAL_TABLET | SUBLINGUAL | Status: DC | PRN

## 2014-10-08 MED ORDER — SODIUM CHLORIDE 0.9 % IR SOLN
Status: DC | PRN
  Administered 2014-10-08: 3000 mL

## 2014-10-08 MED ORDER — SODIUM CHLORIDE 0.9 % IJ SOLN
INTRAMUSCULAR | Status: AC
  Filled 2014-10-08: qty 10

## 2014-10-08 MED ORDER — APIXABAN 5 MG PO TABS
5.0000 mg | ORAL_TABLET | Freq: Two times a day (BID) | ORAL | Status: DC
  Administered 2014-10-09 – 2014-10-11 (×5): 5 mg via ORAL
  Filled 2014-10-08 (×9): qty 1

## 2014-10-08 MED ORDER — HYDROMORPHONE HCL 1 MG/ML IJ SOLN
INTRAMUSCULAR | Status: AC
  Administered 2014-10-08: 0.5 mg
  Filled 2014-10-08: qty 1

## 2014-10-08 MED ORDER — FERROUS SULFATE 325 (65 FE) MG PO TABS
325.0000 mg | ORAL_TABLET | Freq: Three times a day (TID) | ORAL | Status: DC
  Administered 2014-10-09 – 2014-10-11 (×6): 325 mg via ORAL
  Filled 2014-10-08 (×10): qty 1

## 2014-10-08 MED ORDER — TRANEXAMIC ACID 100 MG/ML IV SOLN
2000.0000 mg | Freq: Once | INTRAVENOUS | Status: DC
  Filled 2014-10-08: qty 20

## 2014-10-08 MED ORDER — ACETAMINOPHEN 10 MG/ML IV SOLN
1000.0000 mg | Freq: Once | INTRAVENOUS | Status: AC
  Administered 2014-10-08: 1000 mg via INTRAVENOUS
  Filled 2014-10-08: qty 100

## 2014-10-08 MED ORDER — EPHEDRINE SULFATE 50 MG/ML IJ SOLN
INTRAMUSCULAR | Status: AC
  Filled 2014-10-08: qty 1

## 2014-10-08 MED ORDER — PHENOL 1.4 % MT LIQD: 1.0000 | OROMUCOSAL | Status: DC | PRN

## 2014-10-08 MED ORDER — FENTANYL CITRATE 0.05 MG/ML IJ SOLN: 25.0000 ug | INTRAMUSCULAR | Status: DC | PRN

## 2014-10-08 MED ORDER — BUPIVACAINE-EPINEPHRINE (PF) 0.25% -1:200000 IJ SOLN
INTRAMUSCULAR | Status: DC | PRN
  Administered 2014-10-08: 30 mL via PERINEURAL

## 2014-10-08 MED ORDER — METHOCARBAMOL 500 MG PO TABS
500.0000 mg | ORAL_TABLET | Freq: Four times a day (QID) | ORAL | Status: DC | PRN
  Administered 2014-10-08 – 2014-10-11 (×6): 500 mg via ORAL
  Filled 2014-10-08 (×6): qty 1

## 2014-10-08 MED ORDER — PROMETHAZINE HCL 25 MG/ML IJ SOLN: 6.2500 mg | INTRAMUSCULAR | Status: DC | PRN

## 2014-10-08 MED ORDER — PHENYLEPHRINE HCL 10 MG/ML IJ SOLN
INTRAMUSCULAR | Status: AC
  Filled 2014-10-08: qty 1

## 2014-10-08 MED ORDER — OXYCODONE HCL 5 MG PO TABS
5.0000 mg | ORAL_TABLET | ORAL | Status: DC
Start: 2014-10-08 — End: 2014-10-11
  Administered 2014-10-08: 10 mg via ORAL
  Administered 2014-10-08 – 2014-10-11 (×14): 15 mg via ORAL
  Filled 2014-10-08 (×13): qty 3
  Filled 2014-10-08: qty 2
  Filled 2014-10-08: qty 3

## 2014-10-08 MED ORDER — SODIUM CHLORIDE 0.9 % IJ SOLN
INTRAMUSCULAR | Status: DC | PRN
  Administered 2014-10-08: 30 mL via INTRAVENOUS

## 2014-10-08 MED ORDER — KETOROLAC TROMETHAMINE 30 MG/ML IJ SOLN
INTRAMUSCULAR | Status: AC
  Filled 2014-10-08: qty 1

## 2014-10-08 MED ORDER — HYDRALAZINE HCL 20 MG/ML IJ SOLN
INTRAMUSCULAR | Status: DC | PRN
  Administered 2014-10-08 (×2): 2.5 mg via INTRAVENOUS

## 2014-10-08 MED ORDER — CEFAZOLIN SODIUM-DEXTROSE 2-3 GM-% IV SOLR
2.0000 g | Freq: Four times a day (QID) | INTRAVENOUS | Status: AC
  Administered 2014-10-08 – 2014-10-09 (×2): 2 g via INTRAVENOUS
  Filled 2014-10-08 (×2): qty 50

## 2014-10-08 MED ORDER — ALUM & MAG HYDROXIDE-SIMETH 200-200-20 MG/5ML PO SUSP
30.0000 mL | ORAL | Status: DC | PRN
  Filled 2014-10-08: qty 30

## 2014-10-08 MED ORDER — ROPINIROLE HCL 0.25 MG PO TABS
0.2500 mg | ORAL_TABLET | Freq: Three times a day (TID) | ORAL | Status: DC
  Administered 2014-10-08 – 2014-10-11 (×8): 0.25 mg via ORAL
  Filled 2014-10-08 (×10): qty 1

## 2014-10-08 MED ORDER — DOCUSATE SODIUM 100 MG PO CAPS
100.0000 mg | ORAL_CAPSULE | Freq: Two times a day (BID) | ORAL | Status: DC
  Administered 2014-10-08 – 2014-10-11 (×6): 100 mg via ORAL

## 2014-10-08 MED ORDER — LIDOCAINE HCL (CARDIAC) 20 MG/ML IV SOLN
INTRAVENOUS | Status: DC | PRN
  Administered 2014-10-08: 50 mg via INTRAVENOUS

## 2014-10-08 MED ORDER — HYDROMORPHONE HCL 1 MG/ML IJ SOLN
0.5000 mg | INTRAMUSCULAR | Status: DC | PRN
  Administered 2014-10-09: 1 mg via INTRAVENOUS
  Filled 2014-10-08: qty 1

## 2014-10-08 MED ORDER — BISACODYL 10 MG RE SUPP: 10.0000 mg | Freq: Every day | RECTAL | Status: DC | PRN

## 2014-10-08 MED ORDER — DIPHENHYDRAMINE HCL 25 MG PO CAPS
50.0000 mg | ORAL_CAPSULE | Freq: Four times a day (QID) | ORAL | Status: DC | PRN
  Administered 2014-10-10 – 2014-10-11 (×3): 50 mg via ORAL
  Filled 2014-10-08 (×4): qty 2

## 2014-10-08 MED ORDER — 0.9 % SODIUM CHLORIDE (POUR BTL) OPTIME
TOPICAL | Status: DC | PRN
  Administered 2014-10-08: 1000 mL

## 2014-10-08 MED ORDER — FENTANYL CITRATE 0.05 MG/ML IJ SOLN
INTRAMUSCULAR | Status: AC
  Filled 2014-10-08: qty 5

## 2014-10-08 MED ORDER — ONDANSETRON HCL 4 MG/2ML IJ SOLN
INTRAMUSCULAR | Status: AC
  Filled 2014-10-08: qty 2

## 2014-10-08 MED ORDER — TRAZODONE HCL 50 MG PO TABS
50.0000 mg | ORAL_TABLET | Freq: Every day | ORAL | Status: DC
  Administered 2014-10-08 – 2014-10-10 (×3): 50 mg via ORAL
  Filled 2014-10-08 (×5): qty 1

## 2014-10-08 MED ORDER — CELECOXIB 200 MG PO CAPS
200.0000 mg | ORAL_CAPSULE | Freq: Two times a day (BID) | ORAL | Status: DC
  Administered 2014-10-08 – 2014-10-11 (×6): 200 mg via ORAL
  Filled 2014-10-08 (×7): qty 1

## 2014-10-08 MED ORDER — METHOCARBAMOL 1000 MG/10ML IJ SOLN
500.0000 mg | Freq: Four times a day (QID) | INTRAVENOUS | Status: DC | PRN
  Administered 2014-10-08: 500 mg via INTRAVENOUS
  Filled 2014-10-08 (×2): qty 5

## 2014-10-08 MED ORDER — ATROPINE SULFATE 0.4 MG/ML IJ SOLN
INTRAMUSCULAR | Status: AC
  Filled 2014-10-08: qty 1

## 2014-10-08 MED ORDER — ONDANSETRON HCL 4 MG/2ML IJ SOLN
INTRAMUSCULAR | Status: DC | PRN
  Administered 2014-10-08 (×2): 2 mg via INTRAVENOUS

## 2014-10-08 MED ORDER — AMLODIPINE BESYLATE 2.5 MG PO TABS
2.5000 mg | ORAL_TABLET | Freq: Every day | ORAL | Status: DC
  Administered 2014-10-09 – 2014-10-11 (×3): 2.5 mg via ORAL
  Filled 2014-10-08 (×3): qty 1

## 2014-10-08 MED ORDER — SODIUM CHLORIDE 0.9 % IV SOLN
INTRAVENOUS | Status: DC
  Administered 2014-10-08: via INTRAVENOUS
  Filled 2014-10-08 (×10): qty 1000

## 2014-10-08 MED ORDER — BUPIVACAINE-EPINEPHRINE (PF) 0.25% -1:200000 IJ SOLN
INTRAMUSCULAR | Status: AC
  Filled 2014-10-08: qty 30

## 2014-10-08 MED ORDER — DEXAMETHASONE SODIUM PHOSPHATE 10 MG/ML IJ SOLN
10.0000 mg | Freq: Once | INTRAMUSCULAR | Status: DC
  Filled 2014-10-08: qty 1

## 2014-10-08 MED ORDER — HYDROMORPHONE HCL 2 MG/ML IJ SOLN
INTRAMUSCULAR | Status: AC
  Filled 2014-10-08: qty 1

## 2014-10-08 MED ORDER — OMEPRAZOLE 20 MG PO CPDR
20.0000 mg | DELAYED_RELEASE_CAPSULE | Freq: Every morning | ORAL | Status: DC
Start: 2014-10-09 — End: 2014-10-11
  Administered 2014-10-09 – 2014-10-11 (×3): 20 mg via ORAL
  Filled 2014-10-08 (×3): qty 1

## 2014-10-08 MED ORDER — HYDROMORPHONE HCL 1 MG/ML IJ SOLN
INTRAMUSCULAR | Status: DC | PRN
  Administered 2014-10-08 (×2): 1 mg via INTRAVENOUS

## 2014-10-08 MED ORDER — TRANEXAMIC ACID 100 MG/ML IV SOLN
2000.0000 mg | INTRAVENOUS | Status: DC | PRN
  Administered 2014-10-08: 2000 mg via TOPICAL

## 2014-10-08 MED ORDER — FENTANYL CITRATE 0.05 MG/ML IJ SOLN
INTRAMUSCULAR | Status: DC | PRN
  Administered 2014-10-08 (×4): 50 ug via INTRAVENOUS
  Administered 2014-10-08: 100 ug via INTRAVENOUS
  Administered 2014-10-08 (×4): 50 ug via INTRAVENOUS
  Administered 2014-10-08: 100 ug via INTRAVENOUS

## 2014-10-08 MED ORDER — ONDANSETRON HCL 4 MG PO TABS: 4.0000 mg | ORAL_TABLET | Freq: Four times a day (QID) | ORAL | Status: DC | PRN

## 2014-10-08 MED ORDER — MENTHOL 3 MG MT LOZG: 1.0000 | LOZENGE | OROMUCOSAL | Status: DC | PRN

## 2014-10-08 MED ORDER — FENTANYL CITRATE 0.05 MG/ML IJ SOLN
INTRAMUSCULAR | Status: AC
  Filled 2014-10-08: qty 2

## 2014-10-08 MED ORDER — ONDANSETRON HCL 4 MG/2ML IJ SOLN: 4.0000 mg | Freq: Four times a day (QID) | INTRAMUSCULAR | Status: DC | PRN

## 2014-10-08 MED ORDER — CHLORHEXIDINE GLUCONATE 4 % EX LIQD: 60.0000 mL | Freq: Once | CUTANEOUS | Status: DC

## 2014-10-08 MED ORDER — SIMVASTATIN 40 MG PO TABS
40.0000 mg | ORAL_TABLET | Freq: Every day | ORAL | Status: DC
  Administered 2014-10-08 – 2014-10-10 (×3): 40 mg via ORAL
  Filled 2014-10-08 (×4): qty 1

## 2014-10-08 MED ORDER — PROPOFOL 10 MG/ML IV BOLUS
INTRAVENOUS | Status: DC | PRN
  Administered 2014-10-08: 120 mg via INTRAVENOUS
  Administered 2014-10-08 (×2): 20 mg via INTRAVENOUS

## 2014-10-08 MED ORDER — LACTATED RINGERS IV SOLN
INTRAVENOUS | Status: DC
  Administered 2014-10-08: 1000 mL via INTRAVENOUS

## 2014-10-08 MED ORDER — MIDAZOLAM HCL 5 MG/5ML IJ SOLN
INTRAMUSCULAR | Status: DC | PRN
  Administered 2014-10-08 (×2): .25 mg via INTRAVENOUS

## 2014-10-08 MED ORDER — CEFAZOLIN SODIUM-DEXTROSE 2-3 GM-% IV SOLR
INTRAVENOUS | Status: AC
Start: 2014-10-08 — End: 2014-10-08
  Filled 2014-10-08: qty 50

## 2014-10-08 MED ORDER — LOPERAMIDE HCL 2 MG PO CAPS: 2.0000 mg | ORAL_CAPSULE | ORAL | Status: DC | PRN

## 2014-10-08 MED ORDER — DEXAMETHASONE SODIUM PHOSPHATE 10 MG/ML IJ SOLN: 10.0000 mg | Freq: Once | INTRAMUSCULAR | Status: DC

## 2014-10-08 MED ORDER — CEFAZOLIN SODIUM-DEXTROSE 2-3 GM-% IV SOLR
2.0000 g | INTRAVENOUS | Status: AC
  Administered 2014-10-08: 2 g via INTRAVENOUS

## 2014-10-08 MED ORDER — LACTATED RINGERS IV SOLN
INTRAVENOUS | Status: DC | PRN
  Administered 2014-10-08 (×2): via INTRAVENOUS

## 2014-10-08 MED ORDER — PROPOFOL 10 MG/ML IV BOLUS
INTRAVENOUS | Status: AC
  Filled 2014-10-08: qty 20

## 2014-10-08 MED ORDER — KETOROLAC TROMETHAMINE 30 MG/ML IJ SOLN
INTRAMUSCULAR | Status: DC | PRN
  Administered 2014-10-08: 30 mg via INTRAVENOUS

## 2014-10-08 MED ORDER — SUCCINYLCHOLINE CHLORIDE 20 MG/ML IJ SOLN
INTRAMUSCULAR | Status: DC | PRN
  Administered 2014-10-08: 100 mg via INTRAVENOUS

## 2014-10-08 MED ORDER — METOPROLOL TARTRATE 25 MG PO TABS
25.0000 mg | ORAL_TABLET | Freq: Two times a day (BID) | ORAL | Status: DC
  Administered 2014-10-08 – 2014-10-11 (×6): 25 mg via ORAL
  Filled 2014-10-08 (×7): qty 1

## 2014-10-08 MED ORDER — HYDROMORPHONE HCL 1 MG/ML IJ SOLN
0.2500 mg | INTRAMUSCULAR | Status: DC | PRN
  Administered 2014-10-08 (×3): 0.5 mg via INTRAVENOUS

## 2014-10-08 MED ORDER — LIDOCAINE HCL (CARDIAC) 20 MG/ML IV SOLN
INTRAVENOUS | Status: AC
  Filled 2014-10-08: qty 5

## 2014-10-08 MED ORDER — MAGNESIUM CITRATE PO SOLN: 1.0000 | Freq: Once | ORAL | Status: AC | PRN

## 2014-10-08 MED ORDER — METOCLOPRAMIDE HCL 10 MG PO TABS: 5.0000 mg | ORAL_TABLET | Freq: Three times a day (TID) | ORAL | Status: DC | PRN

## 2014-10-08 MED ORDER — DEXAMETHASONE SODIUM PHOSPHATE 10 MG/ML IJ SOLN
INTRAMUSCULAR | Status: DC | PRN
  Administered 2014-10-08: 10 mg via INTRAVENOUS

## 2014-10-08 MED ORDER — CEFAZOLIN SODIUM-DEXTROSE 2-3 GM-% IV SOLR: 2.0000 g | INTRAVENOUS | Status: DC

## 2014-10-08 MED ORDER — ISOSORBIDE MONONITRATE ER 30 MG PO TB24
30.0000 mg | ORAL_TABLET | Freq: Every day | ORAL | Status: DC
  Administered 2014-10-09 – 2014-10-11 (×3): 30 mg via ORAL
  Filled 2014-10-08 (×3): qty 1

## 2014-10-08 MED ORDER — DEXAMETHASONE SODIUM PHOSPHATE 10 MG/ML IJ SOLN
INTRAMUSCULAR | Status: AC
  Filled 2014-10-08: qty 1

## 2014-10-08 MED ORDER — SODIUM CHLORIDE 0.9 % IJ SOLN
INTRAMUSCULAR | Status: AC
  Filled 2014-10-08: qty 50

## 2014-10-08 MED ORDER — PROPOFOL INFUSION 10 MG/ML OPTIME
INTRAVENOUS | Status: DC | PRN
  Administered 2014-10-08: 100 ug/kg/min via INTRAVENOUS

## 2014-10-08 MED ORDER — METOCLOPRAMIDE HCL 5 MG/ML IJ SOLN: 5.0000 mg | Freq: Three times a day (TID) | INTRAMUSCULAR | Status: DC | PRN

## 2014-10-08 MED ORDER — SODIUM CHLORIDE 0.9 % IV SOLN
10.0000 mg | INTRAVENOUS | Status: DC | PRN
  Administered 2014-10-08: 20 ug/min via INTRAVENOUS

## 2014-10-08 SURGICAL SUPPLY — 80 items
ADAPTER BOLT FEMORAL +2/-2 (Knees) ×2 IMPLANT
ADPR FEM +2/-2 OFST BOLT (Knees) ×1 IMPLANT
ADPR FEM 5D STRL KN PFC SGM (Orthopedic Implant) ×1 IMPLANT
AUG FEM SZ2.5 8 STRL LF KN RT (Knees) ×2 IMPLANT
AUG TIB SZ2 5 REV STP WDG STRL (Knees) ×2 IMPLANT
AUGMENT DIST PFC 8MM SZ2.5 RT (Knees) IMPLANT
AUGMENT PFC SIG RP SZ2.5 12.5M (Knees) IMPLANT
BAG SPEC THK2 15X12 ZIP CLS (MISCELLANEOUS) ×1
BAG ZIPLOCK 12X15 (MISCELLANEOUS) ×3 IMPLANT
BANDAGE ELASTIC 6 VELCRO ST LF (GAUZE/BANDAGES/DRESSINGS) ×3 IMPLANT
BANDAGE ESMARK 6X9 LF (GAUZE/BANDAGES/DRESSINGS) ×1 IMPLANT
BLADE SAW SGTL 13.0X1.19X90.0M (BLADE) ×3 IMPLANT
BLADE SAW SGTL 81X20 HD (BLADE) ×3 IMPLANT
BNDG CMPR 9X6 STRL LF SNTH (GAUZE/BANDAGES/DRESSINGS) ×1
BNDG CMPR MED 10X6 ELC LF (GAUZE/BANDAGES/DRESSINGS) ×1
BNDG ELASTIC 6X10 VLCR STRL LF (GAUZE/BANDAGES/DRESSINGS) ×2 IMPLANT
BNDG ESMARK 6X9 LF (GAUZE/BANDAGES/DRESSINGS) ×3
BONE CEMENT GENTAMICIN (Cement) ×9 IMPLANT
CEMENT BONE GENTAMICIN 40 (Cement) ×3 IMPLANT
CEMENT RESTRICTOR DEPUY SZ 4 (Cement) ×2 IMPLANT
CUFF TOURN SGL QUICK 34 (TOURNIQUET CUFF) ×3
CUFF TRNQT CYL 34X4X40X1 (TOURNIQUET CUFF) ×1 IMPLANT
DISAL AUG PFC 8MM SZ2.5 RT (Knees) ×6 IMPLANT
DRAPE EXTREMITY T 121X128X90 (DRAPE) ×3 IMPLANT
DRAPE POUCH INSTRU U-SHP 10X18 (DRAPES) ×3 IMPLANT
DRAPE U-SHAPE 47X51 STRL (DRAPES) ×3 IMPLANT
DRSG AQUACEL AG ADV 3.5X10 (GAUZE/BANDAGES/DRESSINGS) ×2 IMPLANT
DRSG PAD ABDOMINAL 8X10 ST (GAUZE/BANDAGES/DRESSINGS) ×3 IMPLANT
DURAPREP 26ML APPLICATOR (WOUND CARE) ×3 IMPLANT
ELECT REM PT RETURN 9FT ADLT (ELECTROSURGICAL) ×3
ELECTRODE REM PT RTRN 9FT ADLT (ELECTROSURGICAL) ×1 IMPLANT
FACESHIELD WRAPAROUND (MASK) ×15 IMPLANT
FACESHIELD WRAPAROUND OR TEAM (MASK) ×5 IMPLANT
FEM TC3 RT PFC SIGMA SZ2.5 (Orthopedic Implant) ×3 IMPLANT
FEMORAL ADAPTER (Orthopedic Implant) ×2 IMPLANT
FEMORAL TC3 RT PFC SIGMA SZ2.5 (Orthopedic Implant) IMPLANT
GAUZE SPONGE 4X4 12PLY STRL (GAUZE/BANDAGES/DRESSINGS) ×6 IMPLANT
GAUZE XEROFORM 5X9 LF (GAUZE/BANDAGES/DRESSINGS) ×3 IMPLANT
GLOVE BIOGEL PI IND STRL 7.5 (GLOVE) ×1 IMPLANT
GLOVE BIOGEL PI IND STRL 8.5 (GLOVE) ×1 IMPLANT
GLOVE BIOGEL PI INDICATOR 7.5 (GLOVE) ×2
GLOVE BIOGEL PI INDICATOR 8.5 (GLOVE) ×2
GLOVE ECLIPSE 8.0 STRL XLNG CF (GLOVE) ×3 IMPLANT
GLOVE ORTHO TXT STRL SZ7.5 (GLOVE) ×6 IMPLANT
GOWN SPEC L3 XXLG W/TWL (GOWN DISPOSABLE) ×3 IMPLANT
GOWN STRL REUS W/TWL LRG LVL3 (GOWN DISPOSABLE) ×3 IMPLANT
HANDPIECE INTERPULSE COAX TIP (DISPOSABLE) ×3
IMMOBILIZER KNEE 20 (SOFTGOODS)
IMMOBILIZER KNEE 20 THIGH 36 (SOFTGOODS) IMPLANT
KIT BASIN OR (CUSTOM PROCEDURE TRAY) ×3 IMPLANT
LIQUID BAND (GAUZE/BANDAGES/DRESSINGS) ×2 IMPLANT
MANIFOLD NEPTUNE II (INSTRUMENTS) ×3 IMPLANT
NDL SAFETY ECLIPSE 18X1.5 (NEEDLE) ×1 IMPLANT
NEEDLE HYPO 18GX1.5 SHARP (NEEDLE) ×3
NS IRRIG 1000ML POUR BTL (IV SOLUTION) ×3 IMPLANT
PACK TOTAL JOINT (CUSTOM PROCEDURE TRAY) ×3 IMPLANT
PADDING CAST COTTON 6X4 STRL (CAST SUPPLIES) ×6 IMPLANT
PATELLA DOME PFC 41MM (Knees) ×2 IMPLANT
PFC SIGMA RP STB SZ 2.5 12.5M (Knees) ×3 IMPLANT
POSITIONER SURGICAL ARM (MISCELLANEOUS) ×3 IMPLANT
RESTRICTOR CEMENT SZ 5 C-STEM (Cement) ×2 IMPLANT
SET HNDPC FAN SPRY TIP SCT (DISPOSABLE) ×1 IMPLANT
SET PAD KNEE POSITIONER (MISCELLANEOUS) ×3 IMPLANT
SPONGE LAP 18X18 X RAY DECT (DISPOSABLE) ×3 IMPLANT
STAPLER VISISTAT 35W (STAPLE) IMPLANT
STEM TIBIA PFC 13X30MM (Stem) ×4 IMPLANT
SUCTION FRAZIER 12FR DISP (SUCTIONS) ×3 IMPLANT
SUT MNCRL AB 3-0 PS2 18 (SUTURE) ×2 IMPLANT
SUT VIC AB 1 CT1 36 (SUTURE) ×9 IMPLANT
SUT VIC AB 2-0 CT1 27 (SUTURE) ×9
SUT VIC AB 2-0 CT1 TAPERPNT 27 (SUTURE) ×3 IMPLANT
SYR 50ML LL SCALE MARK (SYRINGE) ×3 IMPLANT
TOWEL OR 17X26 10 PK STRL BLUE (TOWEL DISPOSABLE) ×3 IMPLANT
TOWER CARTRIDGE SMART MIX (DISPOSABLE) ×5 IMPLANT
TRAY FOLEY CATH 14FRSI W/METER (CATHETERS) ×3 IMPLANT
TRAY SLEEVE CEM ML (Knees) ×2 IMPLANT
TRAY TIB SZ 2 REVISION (Knees) ×2 IMPLANT
WATER STERILE IRR 1500ML POUR (IV SOLUTION) ×6 IMPLANT
WEDGE SZ 2.0MM 5MM (Knees) ×4 IMPLANT
WRAP KNEE MAXI GEL POST OP (GAUZE/BANDAGES/DRESSINGS) ×3 IMPLANT

## 2014-10-08 NOTE — Brief Op Note (Signed)
10/08/2014  5:41 PM  PATIENT:  Jacqueline [name]  81 y.o. female  PRE-OPERATIVE DIAGNOSIS:  status post resection right total knee arthroplasty and placement of antibiotic spacer due to infection  POST-OPERATIVE DIAGNOSIS:  status post resection right total knee arthroplasty and placement  PROCEDURE:  Procedure(s): REIMPLANTATION OF RIGHT TOTAL KNEE ARTHROPLASTY WITH REMOVAL OF ANTIBIOTIC SPACER (Right)  SURGEON:  Surgeon(s) and Role:    * Paralee Cancel, MD - Primary  PHYSICIAN ASSISTANT: Danae Orleans, PA-C  ANESTHESIA:   general  EBL:  Total I/O In: -  Out: 450 [Urine:350; Blood:100]  BLOOD ADMINISTERED:none  DRAINS: none   LOCAL MEDICATIONS USED:  MARCAINE and Tranexamic Acid  SPECIMEN:  No Specimen  DISPOSITION OF SPECIMEN:  N/A  COUNTS:  YES  TOURNIQUET:   Total Tourniquet Time Documented: Thigh (Right) - 65 minutes Total: Thigh (Right) - 65 minutes   DICTATION: .Other Dictation: Dictation Number [id number]  PLAN OF CARE: Admit to inpatient   PATIENT DISPOSITION:  PACU - hemodynamically stable.   Delay start of Pharmacological VTE agent (>24hrs) due to surgical blood loss or risk of bleeding: no

## 2014-10-08 NOTE — Anesthesia Postprocedure Evaluation (Signed)
  Anesthesia Post-op Note  Patient: Jacqueline [name]  Procedure(s) Performed: Procedure(s) (LRB): REIMPLANTATION OF RIGHT TOTAL KNEE ARTHROPLASTY WITH REMOVAL OF ANTIBIOTIC SPACER (Right)  Patient Location: PACU  Anesthesia Type: General  Level of Consciousness: awake and alert   Airway and Oxygen Therapy: Patient Spontanous Breathing  Post-op Pain: mild  Post-op Assessment: Post-op Vital signs reviewed, Patient's Cardiovascular Status Stable, Respiratory Function Stable, Patent Airway and No signs of Nausea or vomiting  Last Vitals:  Filed Vitals:   10/08/14 1851  BP: 161/70  Pulse: 85  Temp: 36.8 C  Resp: 16    Post-op Vital Signs: stable   Complications: No apparent anesthesia complications

## 2014-10-08 NOTE — Anesthesia Preprocedure Evaluation (Addendum)
Anesthesia Evaluation  Patient identified by MRN, date of birth, ID band Patient awake    Reviewed: Allergy & Precautions, NPO status , Patient's Chart, lab work & pertinent test results  Airway Mallampati: II  TM Distance: >3 FB Neck ROM: Full    Dental no notable dental hx.    Pulmonary neg pulmonary ROS,  breath sounds clear to auscultation  Pulmonary exam normal       Cardiovascular hypertension, Pt. on medications + CAD Rhythm:Regular Rate:Normal     Neuro/Psych negative neurological ROS  negative psych ROS   GI/Hepatic negative GI ROS, Neg liver ROS,   Endo/Other  negative endocrine ROS  Renal/GU negative Renal ROS  negative genitourinary   Musculoskeletal negative musculoskeletal ROS (+)   Abdominal   Peds negative pediatric ROS (+)  Hematology negative hematology ROS (+)   Anesthesia Other Findings   Reproductive/Obstetrics negative OB ROS                            Anesthesia Physical Anesthesia Plan  ASA: III  Anesthesia Plan: General   Post-op Pain Management:    Induction: Intravenous  Airway Management Planned: LMA and Oral ETT  Additional Equipment:   Intra-op Plan:   Post-operative Plan: Extubation in OR  Informed Consent: I have reviewed the patients History and Physical, chart, labs and discussed the procedure including the risks, benefits and alternatives for the proposed anesthesia with the patient or authorized representative who has indicated his/her understanding and acceptance.   Dental advisory given  Plan Discussed with: CRNA  Anesthesia Plan Comments: (Pt refuses sab)        Anesthesia Quick Evaluation

## 2014-10-08 NOTE — Transfer of Care (Signed)
Immediate Anesthesia Transfer of Care Note  Patient: Jacqueline [name]  Procedure(s) Performed: Procedure(s): REIMPLANTATION OF RIGHT TOTAL KNEE ARTHROPLASTY WITH REMOVAL OF ANTIBIOTIC SPACER (Right)  Patient Location: PACU  Anesthesia Type:General  Level of Consciousness: awake, oriented, patient cooperative and responds to stimulation  Airway & Oxygen Therapy: Patient Spontanous Breathing and Patient connected to face mask oxygen  Post-op Assessment: Report given to RN, Post -op Vital signs reviewed and stable and Patient moving all extremities  Post vital signs: Reviewed and stable  Last Vitals:  Filed Vitals:   10/08/14 1804  BP: 146/96  Pulse: 95  Temp:   Resp: 17    Complications: No apparent anesthesia complications

## 2014-10-08 NOTE — Interval H&P Note (Signed)
History and Physical Interval Note:  10/08/2014 2:27 PM  Jacqueline [name]  has presented today for surgery, with the diagnosis of status post resection right total knee arthroplasty and placement of antibiotic spacer due to infection  The various methods of treatment have been discussed with the patient and family. After consideration of risks, benefits and other options for treatment, the patient has consented to  Procedure(s): REIMPLANTATION OF RIGHT TOTAL KNEE ARTHROPLASTY WITH REMOVAL OF ANTIBIOTIC SPACER (Right) as a surgical intervention .  The patient's history has been reviewed, patient examined, no change in status, stable for surgery.  I have reviewed the patient's chart and labs.  Questions were answered to the patient's satisfaction.     Mauri Pole

## 2014-10-09 ENCOUNTER — Encounter (HOSPITAL_COMMUNITY): Payer: Self-pay | Admitting: Orthopedic Surgery

## 2014-10-09 LAB — BASIC METABOLIC PANEL
Anion gap: 9 (ref 5–15)
BUN: 18 mg/dL (ref 6–23)
CO2: 23 mmol/L (ref 19–32)
Calcium: 8.7 mg/dL (ref 8.4–10.5)
Chloride: 106 mmol/L (ref 96–112)
Creatinine, Ser: 1.05 mg/dL (ref 0.50–1.10)
GFR, EST AFRICAN AMERICAN: 56 mL/min — AB (ref >90)
GFR, EST NON AFRICAN AMERICAN: 48 mL/min — AB (ref >90)
Glucose, Bld: 144 mg/dL — ABNORMAL HIGH (ref 70–99)
Potassium: 4.7 mmol/L (ref 3.5–5.1)
Sodium: 138 mmol/L (ref 135–145)

## 2014-10-09 LAB — CBC
HCT: 25.2 % — ABNORMAL LOW (ref 36.0–46.0)
HEMOGLOBIN: 8.4 g/dL — AB (ref 12.0–15.0)
MCH: 31 pg (ref 26.0–34.0)
MCHC: 33.3 g/dL (ref 30.0–36.0)
MCV: 93 fL (ref 78.0–100.0)
Platelets: 129 10*3/uL — ABNORMAL LOW (ref 150–400)
RBC: 2.71 MIL/uL — ABNORMAL LOW (ref 3.87–5.11)
RDW: 13.7 % (ref 11.5–15.5)
WBC: 7.2 10*3/uL (ref 4.0–10.5)

## 2014-10-09 NOTE — Progress Notes (Signed)
Patient ID: Jacqueline [name], female   DOB: 14-Mar-1931, 81 y.o.   MRN: [id number] Subjective: 1 Day Post-Op Procedure(s) (LRB): REIMPLANTATION OF RIGHT TOTAL KNEE ARTHROPLASTY WITH REMOVAL OF ANTIBIOTIC SPACER (Right)    Patient reports pain as moderate but comfortable after getting initial pain under control  Objective:   VITALS:   Filed Vitals:   10/09/14 0457  BP: 120/65  Pulse: 59  Temp: 97.5 F (36.4 C)  Resp: 16    Neurovascular intact Incision: dressing C/D/I  LABS  Recent Labs  10/09/14 0440  HGB 8.4*  HCT 25.2*  WBC 7.2  PLT 129*     Recent Labs  10/09/14 0440  NA 138  K 4.7  BUN 18  CREATININE 1.05  GLUCOSE 144*     Recent Labs  10/08/14 1420  INR 1.04     Assessment/Plan: 1 Day Post-Op Procedure(s) (LRB): REIMPLANTATION OF RIGHT TOTAL KNEE ARTHROPLASTY WITH REMOVAL OF ANTIBIOTIC SPACER (Right)   Advance diet Up with therapy Discharge to SNF when ready, family has had initial conversations with [facility]  ABLA -  Watch CBC, vitals stable for now Consider transfusion if symptomatic

## 2014-10-09 NOTE — Plan of Care (Signed)
Problem: Consults Goal: Diagnosis- Total Joint Replacement Outcome: Completed/Met Date Met:  10/09/14 Primary Total Knee Reimplant RIGHT Total Knee

## 2014-10-09 NOTE — Op Note (Signed)
NAME:  Jacqueline [name], Jacqueline [name] NO.:  [id number]  MEDICAL RECORD NO.:  [id number]  LOCATION:  [id number]                         FACILITY:  [facility]  PHYSICIAN:  Pietro Cassis. Alvan Dame, M.D.  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  PROCEDURE:  Revision/reimplantation right total knee arthroplasty. Components used DePuy Knee System with size 2.5 femur, 8-mm medial and lateral distal augments, a +2 adapter 5-degree bolt and a 13 x 30 stem. The tibia sized with a size 2 with 5-mm medial and lateral augments, 29 cemented sleeve, MBT revision tray with a 13 x 30 cemented stem, a 41 patellar button and a 12.5 insert to match the 2.5 femur.  SURGEON:  Pietro Cassis. Alvan Dame, M.D.  ASSISTANT:  Danae Orleans, PA-C.  Note that Mr. Jacqueline [name] was present for the entirety of the case from preoperative position, perioperative management of the operative extremities, general facilitation of the case, and primary wound closure.  ANESTHESIA:  General.  SPECIMENS:  None.  COMPLICATIONS:  None.  BLOOD LOSS:  Probably about 250 mL.  DRAINS:  None.  TOURNIQUET TIME:  65 minutes at 250 mmHg.  FINDINGS:  The patient was noted to have clear synovial fluid inside the foot, slightly blood tinged.  No signs of infection.  INDICATION FOR PROCEDURE:  Jacqueline [name] is an 81 year old female, who I was asked to assume care for an infected right total knee arthroplasty. Approximately 3 months ago, we had resected her right knee, finding infectious, she was treated with antibiotics for 6 weeks IV.  Her delayed in getting to the operating room some more for medical comorbidity, reasons  and concerns for cardiac history and workup.  Jacqueline [name] was very miserable with the cemented implant spacer and was definitely ready to have this done despite the risks outlined.  We discussed the risk of infection with the 2-stage treatment of infection and ranging as high as 80-90% success.  We discussed the medical comorbidity, concerns that she had.  After reviewing this, she wished, was very anxious to go ahead and proceed with reimplantation of right total knee arthroplasty.  Consent was obtained.  PROCEDURE IN DETAIL:  The patient was brought to the operative theater. Once adequate anesthesia, preoperative antibiotics administered, 2 g of Ancef as well as 10 mg of Decadron, she was positioned supine with the right thigh tourniquet placed.  The right lower extremity was then prepped and draped in sterile fashion.  A time-out was performed identifying the patient, planned procedure, and extremity.  Following this, the leg was exsanguinated and tourniquet elevated to 250 mmHg.  The patient's old incision was identified and preoperatively marked out and then incised.  Soft tissue planes were created.  The median arthrotomy was made.  Proximal medial peel was carried out as well as a slight banana peel technique of the lateral aspect of the tibial tubercle for exposure  to prevent having to do a quadriceps snip. The first portion of the case was carried out synovectomy medial, lateral as well as superiorly.  Exposure around the patella was carried out, debriding and debulking some of the scar tissue in this area. First attention was directed at patella given the thin nature of it, minimal bone was resected off to the cut surface, I then selected a 41 button to help restore height predominantly, but also to protect the patella from stresses.  The drill holes were made and a 38 button was placed to protect the patella during the remainder of the case.  Carefully, the knee was flexed  up.  We used an osteotome and removed the femoral and the tibial cement without difficulty.  Once these were removed without damage, the bone was assessed removing any fibrous tissue around there.  I then hand reamed up from a 10-mm to 15-mm reamer both from the distal femur and proximal tibia.  Once this was done, we attended to the tibia first using an extramedullary guide.  I set up for minimal resection with 2 degrees of posterior slope to allow for placement of the MBT revision tray.  I sized this to be a size 2 to 2.5, ultimately selecting the size 2 based on the medial aspect of the proximal tibia and related bone loss.  However, during the trial phase, I used a 2.5 tibial tray.  I then broached for a 29 cemented sleeve and reamed for the MBT revision tray with a 30-mm stem.  Trial component was then placed into the proximal tibia, positioned over the medial third of the tibial tubercle.  With this in place and evaluated to be a perpendicular surface for rotation purposes on the femur, now attended to the femur.  A 13-mm reamer was placed in the femur with an 18-mm aperture open cylinder to maintain the orientation of the rod.  The distal cutting block was positioned and about a 1-mm cut was made off the medial and lateral aspect of the femur with good cut surfaces.  I then sized the femur to be a size 2.5.  The size 2.5 rotation block was then placed over the intramedullary guide and rested on the proximal tibia cut to set rotation.  It was pinned into place, and anterior, posterior and chamfer cuts were all revisited, minimal bone removed, but some bone nonetheless.  Final box cut was made in the T3C cup based on the bone loss that was present and also the potential to use this as the final component anyways.  At this point, I did a trial reduction again with a 5-mm medial and lateral tibial augments placed on the tibial tray already.  I initially started with just a standard  femoral component with a +2 adapter 5-degree bolt and a 13 x 30 stem.  With this, there was noted to be hyperextension.  I then removed this and placed 8-mm augments and found the knee came to extension just slightly hyperextension with a 10-mm insert, but the flexion gap remained stable.  At this point, we selected all the final components.  They were opened on the back table after visualization and identification and then configured on the back table on direct supervision.  While this was going on, we utilized the CanalBrush irrigator and irrigated the canal from both the distal femur and proximal tibia.  We measured and placed size 5 cement restrictor on the femur and a size 4 of the tibia at  appropriate depth and measured all the trial components.  The knee was then irrigated with approximately 2 liters of normal saline solution at this point.  Once the final components were configured on the back table again under my direct supervision, we mixed up three batches of gentamicin impregnated high- viscosity cement.  The final components were then cemented into place. The knee was brought to extension with a 12.5-mm insert.  Once the cement had fully cured, excessive cement was removed throughout the knee.  I selected a 12.5 insert as a posterior stabilized non-TC3 insert.  Following this, the knee was irrigated with normal saline solution.  We then reapproximated the extensor mechanism with a combination of #1 Vicryl and 0 V-Loc suture without complication.  The remainder of the wound was closed with 2-0 Vicryl and running 3-0 Monocryl.  She was then awakened from anesthesia and brought to the recovery room in stable condition tolerating the procedure well.  Findings were reviewed with family.     Pietro Cassis Alvan Dame, M.D.     MDO/MEDQ  D:  10/08/2014  T:  10/09/2014  Job:  [id number]

## 2014-10-09 NOTE — Evaluation (Signed)
Occupational Therapy Evaluation Patient Details Name: Jacqueline [name] MRN: [id number] DOB: [birth year] Today's Date: 10/09/2014    History of Present Illness Pt is s/p reimplantation of TKA with removal of antibiotic spacer on R   Clinical Impression   This 81 year old female was admitted for the above surgery. She will benefit from skilled OT to increase safety and independence with adls.  Pt currently needs min A overall for ADLs.  Goals in acute are for supervision level.  Pt will benefit from continued OT at SNF as she lives alone    Follow Up Recommendations  SNF    Equipment Recommendations  3 in 1 bedside comode (pt may still have this)    Recommendations for Other Services       Precautions / Restrictions Precautions Precautions: Knee Restrictions Weight Bearing Restrictions: No      Mobility Bed Mobility Overal bed mobility: Needs Assistance Bed Mobility: Supine to Sit     Supine to sit: Min assist     General bed mobility comments: min A for RLE  Transfers Overall transfer level: Needs assistance Equipment used: Rolling walker (2 wheeled) Transfers: Sit to/from [facility] Sit to Stand: Min assist Stand pivot transfers: Min assist       General transfer comment: min A to rise and steady.  Cues for UE placement    Balance                                            ADL Overall ADL's : Needs assistance/impaired     Grooming: Wash/dry face;Wash/dry hands;Set up;Sitting   Upper Body Bathing: Set up;Sitting   Lower Body Bathing: Minimal assistance;Sit to/from stand   Upper Body Dressing : Minimal assistance;Sitting (IV)   Lower Body Dressing: Moderate assistance;Sit to/from stand   Toilet Transfer: Minimal assistance;Stand-pivot (to recliner)             General ADL Comments: performed ADL from EOB then transferred to recliner.  Pt has catheter in place.      Vision     Perception     Praxis       Pertinent Vitals/Pain Pain Assessment: 0-10 Pain Score: 5  Pain Location:  r knee Pain Descriptors / Indicators: Aching Pain Intervention(s): Limited activity within patient's tolerance;Monitored during session;Premedicated before session;Repositioned;Ice applied     Hand Dominance     Extremity/Trunk Assessment Upper Extremity Assessment Upper Extremity Assessment: RUE deficits/detail (h/o L RCR, WFLs) RUE Deficits / Details: h/o RCR  has difficulty with FF; active 45 degrees with shoulder elevation, AAROM to 90           Communication Communication Communication: No difficulties   Cognition Arousal/Alertness: Awake/alert Behavior During Therapy: WFL for tasks assessed/performed Overall Cognitive Status: Within Functional Limits for tasks assessed                     General Comments       Exercises       Shoulder Instructions      Home Living Family/patient expects to be discharged to:: Skilled nursing facility Living Arrangements: Alone                                      Prior Functioning/Environment Level of Independence: Independent  OT Diagnosis: Generalized weakness   OT Problem List: Decreased strength;Decreased activity tolerance;Pain   OT Treatment/Interventions: Self-care/ADL training;DME and/or AE instruction;Patient/family education    OT Goals(Current goals can be found in the care plan section) Acute Rehab OT Goals Patient Stated Goal: get back outside OT Goal Formulation: With patient Time For Goal Achievement: 10/16/14 Potential to Achieve Goals: Good ADL Goals Pt Will Perform Grooming: with supervision;standing Pt Will Transfer to Toilet: with supervision;ambulating;bedside commode Pt Will Perform Toileting - Clothing Manipulation and hygiene: with supervision;sit to/from stand Additional ADL Goal #1: pt will complete LB ADLs with AE and supervision, sit to stand  OT Frequency: Min 2X/week    Barriers to D/C:            Co-evaluation              End of Session    Activity Tolerance: Patient tolerated treatment well Patient left: in chair;with call bell/phone within reach   Time: [id number] OT Time Calculation (min): 28 min Charges:  OT General Charges $OT Visit: 1 Procedure OT Evaluation $Initial OT Evaluation Tier I: 1 Procedure OT Treatments $Self Care/Home Management : 8-22 mins G-Codes:    Kamaree Wheatley 11-05-14, 10:50 AM  Lesle Chris, OTR/L [phone] 11/05/14

## 2014-10-09 NOTE — Progress Notes (Signed)
Clinical Social Work Department CLINICAL SOCIAL WORK PLACEMENT NOTE 10/09/2014  Patient:  Jacqueline [name], Jacqueline [name]  Account Number:  [id number] Admit date:  10/08/2014  Clinical Social Worker:  Werner Lean, LCSW  Date/time:  10/09/2014 03:49 PM  Clinical Social Work is seeking post-discharge placement for this patient at the following level of care:   SKILLED NURSING   (*CSW will update this form in Epic as items are completed)     Patient/family provided with [place] Department of Clinical Social Work's list of facilities offering this level of care within the geographic area requested by the patient (or if unable, by the patient's family).  10/09/2014  Patient/family informed of their freedom to choose among providers that offer the needed level of care, that participate in Medicare, Medicaid or managed care program needed by the patient, have an available bed and are willing to accept the patient.    Patient/family informed of [facility]' ownership interest in [facility], as well as of the fact that they are under no obligation to receive care at this facility.  PASARR submitted to EDS on 10/09/2014 PASARR number received on 10/09/2014  FL2 transmitted to all facilities in geographic area requested by pt/family on  10/09/2014 FL2 transmitted to all facilities within larger geographic area on   Patient informed that his/her managed care company has contracts with or will negotiate with  certain facilities, including the following:     Patient/family informed of bed offers received:  10/09/2014 Patient chooses bed at [facility] Physician recommends and patient chooses bed at    Patient to be transferred to  on   Patient to be transferred to facility by  Patient and family notified of transfer on  Name of family member notified:    The following physician request were entered in Epic:   Additional Comments:  Werner Lean LCSW [phone]

## 2014-10-09 NOTE — Evaluation (Signed)
Physical Therapy Evaluation Patient Details Name: Jacqueline [name] MRN: [id number] DOB: 01-Jun-1931 Today's Date: 10/09/2014   History of Present Illness  Pt is s/p reimplantation of TKA with removal of antibiotic spacer on R  Clinical Impression  Pt s/p R THR presents with decreased R LE strength/ROM and post op pain limiting functional mobility.  Pt will benefit from follow up rehab at SNF level to maximize IND and safety prior to return home with ltd assist.    Follow Up Recommendations SNF    Equipment Recommendations       Recommendations for Other Services OT consult     Precautions / Restrictions Precautions Precautions: Knee Required Braces or Orthoses: Knee Immobilizer - Right Knee Immobilizer - Right: Discontinue once straight leg raise with < 10 degree lag Restrictions Weight Bearing Restrictions: No Other Position/Activity Restrictions: WBAT      Mobility  Bed Mobility Overal bed mobility: Needs Assistance Bed Mobility: Sit to Supine     Supine to sit: Min assist Sit to supine: Min assist   General bed mobility comments: cues for sequence and use of L LE to self assist  Transfers Overall transfer level: Needs assistance Equipment used: Rolling walker (2 wheeled) Transfers: Sit to/from Stand Sit to Stand: Min assist Stand pivot transfers: Min assist       General transfer comment: min A to rise and steady.  Cues for UE placement  Ambulation/Gait Ambulation/Gait assistance: Min assist Ambulation Distance (Feet): 65 Feet Assistive device: Rolling walker (2 wheeled) Gait Pattern/deviations: Step-to pattern;Decreased step length - right;Decreased step length - left;Shuffle;Trunk flexed     General Gait Details: cues for sequence, posture and position from [employer]            Wheelchair Mobility    Modified Rankin (Stroke Patients Only)       Balance                                             Pertinent Vitals/Pain  Pain Assessment: 0-10 Pain Score: 5  Pain Location: R knee Pain Descriptors / Indicators: Aching;Sore Pain Intervention(s): Limited activity within patient's tolerance;Monitored during session;Premedicated before session;Ice applied    Home Living Family/patient expects to be discharged to:: Skilled nursing facility Living Arrangements: Alone                    Prior Function Level of Independence: Independent         Comments: 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     Hand Dominance   Dominant Hand: Right    Extremity/Trunk Assessment   Upper Extremity Assessment: RUE deficits/detail RUE Deficits / Details: h/o RCR  has difficulty with FF; active 45 degrees with shoulder elevation, AAROM to 90         Lower Extremity Assessment: RLE deficits/detail RLE Deficits / Details: 2/5 quads with AAROM at knee -10 - 45    Cervical / Trunk Assessment: Normal  Communication   Communication: No difficulties  Cognition Arousal/Alertness: Awake/alert Behavior During Therapy: WFL for tasks assessed/performed Overall Cognitive Status: Within Functional Limits for tasks assessed                      General Comments      Exercises Total Joint Exercises Ankle Circles/Pumps: AROM;Both;15 reps;Supine Quad Sets: AROM;Both;10 reps;Supine  Heel Slides: AAROM;Right;Supine;15 reps Straight Leg Raises: AAROM;Right;10 reps;Supine      Assessment/Plan    PT Assessment Patient needs continued PT services  PT Diagnosis Difficulty walking   PT Problem List Decreased strength;Decreased range of motion;Decreased activity tolerance;Decreased mobility;Decreased knowledge of use of DME;Pain  PT Treatment Interventions DME instruction;Gait training;Functional mobility training;Therapeutic activities;Therapeutic exercise;Patient/family education   PT Goals (Current goals can be found in the Care Plan section) Acute Rehab PT  Goals Patient Stated Goal: get back outside PT Goal Formulation: With patient Time For Goal Achievement: 10/16/14 Potential to Achieve Goals: Good    Frequency 7X/week   Barriers to discharge        Co-evaluation               End of Session Equipment Utilized During Treatment: Gait belt;Right knee immobilizer Activity Tolerance: Patient tolerated treatment well Patient left: in bed;with call bell/phone within reach;with family/visitor present Nurse Communication: Mobility status         Time: [id number] PT Time Calculation (min) (ACUTE ONLY): 52 min   Charges:   PT Evaluation $Initial PT Evaluation Tier I: 1 Procedure PT Treatments $Gait Training: 8-22 mins $Therapeutic Exercise: 8-22 mins   PT G Codes:        Jacqueline [name] October 17, 2014, 12:46 PM

## 2014-10-09 NOTE — Progress Notes (Signed)
Physical Therapy Treatment Patient Details Name: Jacqueline [name] MRN: [id number] DOB: [birth year] Today's Date: 10/09/2014    History of Present Illness Pt is s/p reimplantation of TKA with removal of antibiotic spacer on R    PT Comments    POD # 2 pm session.  Pt had just got back to bed from bathroom with RN so performed TKR TE's followed by ICE.    Follow Up Recommendations  SNF ([facility])     Equipment Recommendations       Recommendations for Other Services       Precautions / Restrictions      Mobility  Bed Mobility                  Transfers                    Ambulation/Gait                 Stairs            Wheelchair Mobility    Modified Rankin (Stroke Patients Only)       Balance                                    Cognition                            Exercises   Total Knee Replacement TE's 10 reps B LE ankle pumps 10 reps towel squeezes 10 reps knee presses 10 reps heel slides  10 reps SAQ's 10 reps SLR's 10 reps ABD Followed by ICE     General Comments        Pertinent Vitals/Pain      Home Living                      Prior Function            PT Goals (current goals can now be found in the care plan section) Progress towards PT goals: Progressing toward goals    Frequency  7X/week    PT Plan      Co-evaluation             End of Session   Activity Tolerance: Patient tolerated treatment well Patient left: in bed;with call bell/phone within reach;with family/visitor present     Time: [id number] PT Time Calculation (min) (ACUTE ONLY): 12 min  Charges:  $Therapeutic Exercise: 8-22 mins                    G Codes:      Rica Koyanagi  PTA [facility]  Acute  Rehab Pager      [phone]

## 2014-10-09 NOTE — Care Management Note (Signed)
    Page 1 of 1   10/09/2014     2:21:36 PM CARE MANAGEMENT NOTE 10/09/2014  Patient:  AAMIRA, JASKO   Account Number:  [id number]  Date Initiated:  10/09/2014  Documentation initiated by:  [facility]  Subjective/Objective Assessment:   adm: REIMPLANTATION OF RIGHT TOTAL KNEE ARTHROPLASTY WITH REMOVAL OF ANTIBIOTIC SPACER (Right)     Action/Plan:   discharge planning   Anticipated DC Date:  10/10/2014   Anticipated DC Plan:  [place]  CM consult      Choice offered to / List presented to:             Status of service:  Completed, signed off Medicare Important Message given?   (If response is "NO", the following Medicare IM given date fields will be blank) Date Medicare IM given:   Medicare IM given by:   Date Additional Medicare IM given:   Additional Medicare IM given by:    Discharge Disposition:  [place]  Per UR Regulation:  Reviewed for med. necessity/level of care/duration of stay  If discussed at [place] of Stay Meetings, dates discussed:    Comments:  10/09/14 14:15 CM notes pt to go to SNF; CSW arranging.  No other CM needs were communicated.  Mariane Masters, BSN, CM [phone].

## 2014-10-09 NOTE — Progress Notes (Signed)
Clinical Social Work Department BRIEF PSYCHOSOCIAL ASSESSMENT 10/09/2014  Patient:  Jacqueline [name]     Account Number:  [id number]     Admit date:  10/08/2014  Clinical Social Worker:  ,, LCSW  Date/Time:  10/09/2014 03:40 PM  Referred by:  Physician  Date Referred:  10/09/2014 Referred for  SNF Placement   Other Referral:   Interview type:  Patient Other interview type:    PSYCHOSOCIAL DATA Living Status:  ALONE Admitted from facility:   Level of care:   Primary support name:  Jacqueline [name] Primary support relationship to patient:  CHILD, ADULT Degree of support available:   supportive    CURRENT CONCERNS Current Concerns  Post-Acute Placement   Other Concerns:    SOCIAL WORK ASSESSMENT / PLAN Pt is an 81 yr old female living at home prior to hospitalization. CSW met with pt / son to assist with [name]/c planning. This is a planned admission. Pt has made prior arrangements to have ST Rehab at [facility] following hospital [name]/c. CSW has contacted SNF and [name]/c plans have been confirmed. CSW will continue to follow to assist with [name]/c planning to SNF.   Assessment/plan status:  Psychosocial Support/Ongoing Assessment of Needs Other assessment/ plan:   Information/referral to community resources:   Insurance coverage for SNF and ambulance transport reviewed. Insurance has provided prior authorization for SNF.    PATIENT'S/FAMILY'S RESPONSE TO PLAN OF CARE: Pt's mood is bright. She reports she had a fairly good night. " I hope I can have a pvt room at [facility]. " Pt is motivated to begin her therapy and is looking forward to rehab.      LCSW [phone]       

## 2014-10-10 DIAGNOSIS — D500 Iron deficiency anemia secondary to blood loss (chronic): Secondary | ICD-10-CM | POA: Diagnosis not present

## 2014-10-10 DIAGNOSIS — E663 Overweight: Secondary | ICD-10-CM | POA: Diagnosis present

## 2014-10-10 LAB — CBC
HCT: 21.7 % — ABNORMAL LOW (ref 36.0–46.0)
Hemoglobin: 7.2 g/dL — ABNORMAL LOW (ref 12.0–15.0)
MCH: 31 pg (ref 26.0–34.0)
MCHC: 33.2 g/dL (ref 30.0–36.0)
MCV: 93.5 fL (ref 78.0–100.0)
PLATELETS: 119 10*3/uL — AB (ref 150–400)
RBC: 2.32 MIL/uL — ABNORMAL LOW (ref 3.87–5.11)
RDW: 14 % (ref 11.5–15.5)
WBC: 6.6 10*3/uL (ref 4.0–10.5)

## 2014-10-10 LAB — BASIC METABOLIC PANEL
ANION GAP: 9 (ref 5–15)
BUN: 21 mg/dL (ref 6–23)
CO2: 25 mmol/L (ref 19–32)
Calcium: 8.8 mg/dL (ref 8.4–10.5)
Chloride: 105 mmol/L (ref 96–112)
Creatinine, Ser: 1.3 mg/dL — ABNORMAL HIGH (ref 0.50–1.10)
GFR, EST AFRICAN AMERICAN: 43 mL/min — AB (ref >90)
GFR, EST NON AFRICAN AMERICAN: 37 mL/min — AB (ref >90)
GLUCOSE: 102 mg/dL — AB (ref 70–99)
POTASSIUM: 4.3 mmol/L (ref 3.5–5.1)
Sodium: 139 mmol/L (ref 135–145)

## 2014-10-10 LAB — PREPARE RBC (CROSSMATCH)

## 2014-10-10 MED ORDER — SODIUM CHLORIDE 0.9 % IV SOLN
Freq: Once | INTRAVENOUS | Status: AC
  Administered 2014-10-10: 18:00:00 via INTRAVENOUS

## 2014-10-10 NOTE — Progress Notes (Signed)
Physical Therapy Treatment Patient Details Name: Jacqueline [name] MRN: [id number] DOB: March 01, 1931 Today's Date: 10/10/2014    History of Present Illness Pt is s/p reimplantation of TKA with removal of antibiotic spacer on R    PT Comments    POD # 2 am session withheld due to low HgB. PM session assisted with amb pt in hallway then performing TKR TE's followed by ICE.    Follow Up Recommendations  SNF [facility])     Equipment Recommendations       Recommendations for Other Services OT consult     Precautions / Restrictions Precautions Precautions: Knee Precaution Comments: instructed pt on KI use for amb Required Braces or Orthoses: Knee Immobilizer - Right Knee Immobilizer - Right: Discontinue once straight leg raise with < 10 degree lag Restrictions Weight Bearing Restrictions: No Other Position/Activity Restrictions: WBAT    Mobility  Bed Mobility Overal bed mobility: Needs Assistance       Supine to sit: Min guard     General bed mobility comments: assisted back to bed  Transfers Overall transfer level: Needs assistance Equipment used: Rolling walker (2 wheeled) Transfers: Sit to/from Stand Sit to Stand: Min assist         General transfer comment: min A to rise and steady.  Cues for UE placement  Ambulation/Gait Ambulation/Gait assistance: Min assist Ambulation Distance (Feet): 65 Feet Assistive device: Rolling walker (2 wheeled) Gait Pattern/deviations: Step-to pattern Gait velocity: decreased   General Gait Details: cues for sequence, posture and position from RW   Stairs            Wheelchair Mobility    Modified Rankin (Stroke Patients Only)       Balance                                    Cognition Arousal/Alertness: Awake/alert Behavior During Therapy: WFL for tasks assessed/performed Overall Cognitive Status: Within Functional Limits for tasks assessed                      Exercises   Total Knee  Replacement TE's 10 reps B LE ankle pumps 10 reps towel squeezes 10 reps knee presses 10 reps heel slides  10 reps SAQ's 10 reps SLR's 10 reps ABD Followed by ICE     General Comments        Pertinent Vitals/Pain Pain Assessment: 0-10 Pain Score: 3  Pain Location: R knee Pain Descriptors / Indicators: Aching;Sore Pain Intervention(s): Monitored during session;Premedicated before session;Repositioned;Ice applied    Home Living                      Prior Function            PT Goals (current goals can now be found in the care plan section) Progress towards PT goals: Progressing toward goals    Frequency       PT Plan      Co-evaluation             End of Session Equipment Utilized During Treatment: Gait belt;Right knee immobilizer Activity Tolerance: Patient tolerated treatment well Patient left: in bed;with call bell/phone within reach;with family/visitor present     Time: 1530-1555 PT Time Calculation (min) (ACUTE ONLY): 25 min  Charges:  $Gait Training: 8-22 mins $Therapeutic Exercise: 8-22 mins  G Codes:      Rica Koyanagi  PTA [facility]  Acute  Rehab Pager      [phone]

## 2014-10-10 NOTE — Progress Notes (Signed)
     Subjective: 2 Days Post-Op Procedure(s) (LRB): REIMPLANTATION OF RIGHT TOTAL KNEE ARTHROPLASTY WITH REMOVAL OF ANTIBIOTIC SPACER (Right)   Patient reports pain as mild, pain controlled. No events throughout the night.   Objective:   VITALS:   Filed Vitals:   10/10/14  BP: 119/53  Pulse: 68  Temp: 97.8 F (36.6 C)   Resp: 16    Dorsiflexion/Plantar flexion intact Incision: dressing C/D/I No cellulitis present Compartment soft  LABS  Recent Labs  10/09/14 0440 10/10/14 0535  HGB 8.4* 7.2*  HCT 25.2* 21.7*  WBC 7.2 6.6  PLT 129* 119*     Recent Labs  10/09/14 0440 10/10/14 0535  NA 138 139  K 4.7 4.3  BUN 18 21  CREATININE 1.05 1.30*  GLUCOSE 144* 102*     Assessment/Plan: 2 Days Post-Op Procedure(s) (LRB): REIMPLANTATION OF RIGHT TOTAL KNEE ARTHROPLASTY WITH REMOVAL OF ANTIBIOTIC SPACER (Right) Up with therapy Discharge to SNF eventually, when ready  ABLA Will receive 2 units of blood.  Treated with iron and will observe  Overweight (BMI 25-29.9) Estimated body mass index is 26.28 kg/(m^2) as calculated from the following:   Height as of this encounter: 5\' 1"  (1.549 m).   Weight as of this encounter: 63.05 kg (139 lb). Patient also counseled that weight may inhibit the healing process Patient counseled that losing weight will help with future health issues       West Pugh. Jacqueline [name]   PAC  10/10/2014, 10:26 AM

## 2014-10-10 NOTE — Plan of Care (Signed)
Problem: Phase III Progression Outcomes Goal: Anticoagulant follow-up in place Outcome: Not Applicable Date Met:  47/09/62 [facility]oquis VTE, no f/u needed.

## 2014-10-11 LAB — CBC
HEMATOCRIT: 33.9 % — AB (ref 36.0–46.0)
Hemoglobin: 11.2 g/dL — ABNORMAL LOW (ref 12.0–15.0)
MCH: 30.3 pg (ref 26.0–34.0)
MCHC: 33 g/dL (ref 30.0–36.0)
MCV: 91.6 fL (ref 78.0–100.0)
PLATELETS: 116 10*3/uL — AB (ref 150–400)
RBC: 3.7 MIL/uL — AB (ref 3.87–5.11)
RDW: 15.2 % (ref 11.5–15.5)
WBC: 6.8 10*3/uL (ref 4.0–10.5)

## 2014-10-11 LAB — TYPE AND SCREEN
ABO/RH(D): O NEG
Antibody Screen: NEGATIVE
Unit division: 0
Unit division: 0

## 2014-10-11 MED ORDER — OXYCODONE HCL 5 MG PO TABS: 5.0000 mg | ORAL_TABLET | ORAL | Status: DC | PRN

## 2014-10-11 MED ORDER — ALPRAZOLAM 0.5 MG PO TABS: 0.5000 mg | ORAL_TABLET | Freq: Two times a day (BID) | ORAL | Status: DC

## 2014-10-11 MED ORDER — POLYETHYLENE GLYCOL 3350 17 G PO PACK: 17.0000 g | PACK | Freq: Two times a day (BID) | ORAL | Status: DC

## 2014-10-11 MED ORDER — DOCUSATE SODIUM 100 MG PO CAPS: 100.0000 mg | ORAL_CAPSULE | Freq: Two times a day (BID) | ORAL | Status: DC

## 2014-10-11 MED ORDER — TIZANIDINE HCL 4 MG PO TABS: 4.0000 mg | ORAL_TABLET | Freq: Three times a day (TID) | ORAL | Status: DC | PRN

## 2014-10-11 NOTE — Progress Notes (Signed)
Clinical Social Work Department CLINICAL SOCIAL WORK PLACEMENT NOTE 10/11/2014  Patient:  Jacqueline [name], Jacqueline [name]  Account Number:  [id number] Admit date:  10/08/2014  Clinical Social Worker:  Werner Lean, LCSW  Date/time:  10/11/2014 02:00 PM  Clinical Social Work is seeking post-discharge placement for this patient at the following level of care:   SKILLED NURSING   (*CSW will update this form in Epic as items are completed)     Patient/family provided with [facility] Department of Clinical Social Work's list of facilities offering this level of care within the geographic area requested by the patient (or if unable, by the patient's family).  10/09/2014  Patient/family informed of their freedom to choose among providers that offer the needed level of care, that participate in Medicare, Medicaid or managed care program needed by the patient, have an available bed and are willing to accept the patient.    Patient/family informed of [facility]' ownership interest in [facility], as well as of the fact that they are under no obligation to receive care at this facility.  PASARR submitted to EDS on 10/09/2014 PASARR number received on 10/09/2014  FL2 transmitted to all facilities in geographic area requested by pt/family on  10/09/2014 FL2 transmitted to all facilities within larger geographic area on   Patient informed that his/her managed care company has contracts with or will negotiate with  certain facilities, including the following:     Patient/family informed of bed offers received:  10/09/2014 Patient chooses bed at [facility] Physician recommends and patient chooses bed at    Patient to be transferred to [facility] on  10/11/2014 Patient to be transferred to facility by [facility] Patient and family notified of transfer on 10/11/2014 Name of family member notified:  SON  The following physician request were entered in  Epic:   Additional Comments: Pt / son in agreement with d/c to SNF today. [facility] transport required. Insurance provided prior [place]. for SNF. NSG reviewed d/c summary, scripts, avs. Scripts included in d/c packet.  Werner Lean LCSW [phone]

## 2014-10-11 NOTE — Progress Notes (Signed)
     Subjective: 3 Days Post-Op Procedure(s) (LRB): REIMPLANTATION OF RIGHT TOTAL KNEE ARTHROPLASTY WITH REMOVAL OF ANTIBIOTIC SPACER (Right)   Patient reports pain as mild, pain controlled. No events throughout the night. Feels better after receiving 2 units of blood yesterday.  Ready to be discharged to skilled nursing facility.  Objective:   VITALS:   Filed Vitals:   10/11/14 0627  BP: 99/57  Pulse: 67  Temp: 98 F (36.7 C)  Resp: 16    Dorsiflexion/Plantar flexion intact Incision: dressing C/D/I No cellulitis present Compartment soft  LABS  Recent Labs  10/09/14 0440 10/10/14 0535 10/11/14 0455  HGB 8.4* 7.2* 11.2*  HCT 25.2* 21.7* 33.9*  WBC 7.2 6.6 6.8  PLT 129* 119* 116*     Recent Labs  10/09/14 0440 10/10/14 0535  NA 138 139  K 4.7 4.3  BUN 18 21  CREATININE 1.05 1.30*  GLUCOSE 144* 102*     Assessment/Plan: 3 Days Post-Op Procedure(s) (LRB): REIMPLANTATION OF RIGHT TOTAL KNEE ARTHROPLASTY WITH REMOVAL OF ANTIBIOTIC SPACER (Right) Up with therapy Discharge to SNF  Follow up in 2 weeks at [facility]. Follow up with OLIN,Jurni Cesaro D in 2 weeks.  Contact information:  [facility] [address] [place] [address] [id number]    ABLA Received 2 units of blood yesterday, feeling better.  Continue treatment with iron and will observe.  Overweight (BMI 25-29.9) Estimated body mass index is 26.28 kg/(m^2) as calculated from the following:  Height as of this encounter: 5\' 1"  (1.549 m).  Weight as of this encounter: 63.05 kg (139 lb). Patient also counseled that weight may inhibit the healing process Patient counseled that losing weight will help with future health issues       West Pugh. Adah [place]   PAC  10/11/2014, 8:35 AM

## 2014-10-11 NOTE — Progress Notes (Signed)
RN called report to [place] at [facility]. All questions answered.  Paperwork and prescriptions sent with [facility].   Patient transported via [place].

## 2014-10-11 NOTE — Progress Notes (Signed)
Physical Therapy Treatment Patient Details Name: Jacqueline [name] MRN: [id number] DOB: 1931/06/16 Today's Date: 10/11/2014    History of Present Illness Pt is s/p reimplantation of TKA with removal of antibiotic spacer on R    PT Comments    POD # 3 pt planning to D/C to [facility] today.  Assisted with applying KI then amb to BR.  Pt required increased time then assisted with amb a short distance in hallway.    Follow Up Recommendations  SNF ([facility])     Equipment Recommendations       Recommendations for Other Services       Precautions / Restrictions Precautions Precautions: Knee Precaution Comments: instructed pt on KI use for amb Required Braces or Orthoses: Knee Immobilizer - Right Restrictions Weight Bearing Restrictions: No    Mobility  Bed Mobility               General bed mobility comments: pt OOB in recliner  Transfers Overall transfer level: Needs assistance Equipment used: Rolling walker (2 wheeled) Transfers: Sit to/from Stand Sit to Stand: Min assist Stand pivot transfers: Min assist       General transfer comment: min A to rise and steady.  Cues for UE placement  Ambulation/Gait Ambulation/Gait assistance: Min assist Ambulation Distance (Feet): 74 Feet Assistive device: Rolling walker (2 wheeled) Gait Pattern/deviations: Step-to pattern Gait velocity: decreased   General Gait Details: cues for sequence, posture and position from RW   Stairs            Wheelchair Mobility    Modified Rankin (Stroke Patients Only)       Balance                                    Cognition                            Exercises      General Comments        Pertinent Vitals/Pain Pain Assessment: 0-10 Pain Score: 3  Pain Location: $R knee Pain Descriptors / Indicators: Aching;Sore Pain Intervention(s): Monitored during session;Ice applied    Home Living                      Prior Function             PT Goals (current goals can now be found in the care plan section) Progress towards PT goals: Progressing toward goals    Frequency  7X/week    PT Plan      Co-evaluation             End of Session Equipment Utilized During Treatment: Gait belt;Right knee immobilizer Activity Tolerance: Patient tolerated treatment well Patient left: in chair;with call bell/phone within reach     Time: 0945-1004 PT Time Calculation (min) (ACUTE ONLY): 19 min  Charges:  $Gait Training: 8-22 mins                    G Codes:      Rica Koyanagi  PTA [facility]  Acute  Rehab Pager      [phone]

## 2014-10-11 NOTE — Discharge Summary (Signed)
Physician Discharge Summary  Patient ID: Jacqueline [name] MRN: [id number] DOB/AGE: 1931-01-24 81 y.o.  Admit date: 10/08/2014 Discharge date:  10/11/2014  Procedures:  Procedure(s) (LRB): REIMPLANTATION OF RIGHT TOTAL KNEE ARTHROPLASTY WITH REMOVAL OF ANTIBIOTIC SPACER (Right)  Attending Physician:  Dr. Paralee Cancel   Admission Diagnoses:   S/P resection of right TKA and placement of antibiotic spacer do to infection   Discharge Diagnoses:  Active Problems:   S/P knee replacement   Acute blood loss anemia   Overweight (BMI 25.0-29.9)  Past Medical History  Diagnosis Date  . Hypertension   . CAD (coronary artery disease)     a. s/p CABG x 2 (LIMA->LAD, VG->Diag);  b. 08/2014 Cath: LM nl, LAD 90p, LCX nl, RCA nl/dominant, LIMA->LAD atretic, VG->D2 patent w/ retrograde filling of LAD, EF 55-60%-->Med Rx.  Marland Kitchen Hyperlipemia   . Insomnia   . Diverticulitis 02/21/2012  . GERD (gastroesophageal reflux disease)   . Atrial fibrillation with rapid ventricular response 02/2014    a. CHA2DS2VASc = 6 -> on eliquis;  b. 02/2014 s/p DCCV;  c. 08/2014 Echo: EF 55-60%, Gr 2 DD, mild MR, triv AI.  Marland Kitchen Arthritis     back    HPI:    Pt is a 81 y.o. female status post resection of right TKA and placement of antibiotic spacer do to infection. Her preclinical workup back in 2015 included aspiration that revealed elevated white blood cells within the synovium, synovial fluid, and elevated C-reactive protein, and sedimentation rate. She also had abone scan ordered, which was positive for increased uptake concerning for loosening or infection. Given the clinical picture present, infection was the diagnosis.Dr. Alvan Dame did a resection of the TKA and placed an antibiotic spacer. She subsequent was on IV antibiotic for 6 weeks. Discussion was had with the patient and family regarding proceeding with a reimplantation of the TKA. They also understand that if everything doesn't look as it should at the time of surgery  that she may receive another antibiotic spacer with another course of antibiotics. Risks, benefits and expectations were discussed with the patient. Risks including but not limited to the risk of anesthesia, blood clots, nerve damage, blood vessel damage, failure of the prosthesis, infection and up to and including death. Patient understand the risks, benefits and expectations and wishes to proceed with surgery.   PCP: Zigmund Gottron, MD   Discharged Condition: good  Hospital Course:  Patient underwent the above stated procedure on 10/08/2014. Patient tolerated the procedure well and brought to the recovery room in good condition and subsequently to the floor.  POD #1 BP: 120/65 ; Pulse: 59 ; Temp: 7.5 F (36.4 C) ; Resp: 16 Patient reports pain as moderate but comfortable after getting initial pain under control. Dorsiflexion/plantar flexion intact, incision: dressing C/D/I, no cellulitis present and compartment soft.   LABS  Basename    HGB  8.4  HCT  25.2   POD #2  BP: 119/53 ; Pulse: 68 ; Temp: 97.8 F (36.6 C) ; Resp: 16 Patient reports pain as mild, pain controlled. No events throughout the night.  Dorsiflexion/plantar flexion intact, incision: dressing C/D/I, no cellulitis present and compartment soft.   LABS  Basename    HGB  7.2  HCT  21.7   POD #3  BP: 99/57 ; Pulse: 67 ; Temp: 98 F (36.7 C) ; Resp: 16 Patient reports pain as mild, pain controlled. No events throughout the night. Feels better after receiving 2 units of blood yesterday. Ready  to be discharged to skilled nursing facility. Dorsiflexion/plantar flexion intact, incision: dressing C/D/I, no cellulitis present and compartment soft.   LABS  Basename    HGB  11.2  HCT  33.9    Discharge Exam: General appearance: alert, cooperative and no distress Extremities: Homans sign is negative, no sign of DVT, no edema, redness or tenderness in the calves or thighs and no ulcers, gangrene or trophic  changes  Disposition: Skilled nursing facility with follow up in 2 weeks   Follow-up Information    Follow up with Mauri Pole, MD. Schedule an appointment as soon as possible for a visit in 2 weeks.   Specialty:  Orthopedic Surgery   Contact information:   [address] [place] [address] [id number]       Discharge Instructions    Call MD / Call 911    Complete by:  As directed   If you experience chest pain or shortness of breath, CALL 911 and be transported to the hospital emergency room.  If you develope a fever above 101 F, pus (white drainage) or increased drainage or redness at the wound, or calf pain, call your surgeon's office.     Change dressing    Complete by:  As directed   Maintain surgical dressing until follow up in the clinic. If the edges start to pull up, may reinforce with tape. If the dressing is no longer working, may remove and cover with gauze and tape, but must keep the area dry and clean.  Call with any questions or concerns.     Constipation Prevention    Complete by:  As directed   Drink plenty of fluids.  Prune juice may be helpful.  You may use a stool softener, such as Colace (over the counter) 100 mg twice a day.  Use MiraLax (over the counter) for constipation as needed.     Diet - low sodium heart healthy    Complete by:  As directed      Discharge instructions    Complete by:  As directed   Maintain surgical dressing until follow up in the clinic. If the edges start to pull up, may reinforce with tape. If the dressing is no longer working, may remove and cover with gauze and tape, but must keep the area dry and clean.  Follow up in 2 weeks at [facility]. Call with any questions or concerns.     Driving restrictions    Complete by:  As directed   No driving for 4 weeks     Increase activity slowly as tolerated    Complete by:  As directed      TED hose    Complete by:  As directed   Use stockings (TED hose)  for 2 weeks on both leg(s).  You may remove them at night for sleeping.     Weight bearing as tolerated    Complete by:  As directed   Laterality:  right  Extremity:  Lower             Medication List    STOP taking these medications        traMADol 50 MG tablet  Commonly known as:  ULTRAM      TAKE these medications        acetaminophen 500 MG tablet  Commonly known as:  TYLENOL  Take 500-1,000 mg by mouth every 6 (six) hours as needed for moderate pain or headache.  alendronate 70 MG tablet  Commonly known as:  FOSAMAX  Take 1 tablet (70 mg total) by mouth once a week. Take with a full glass of water on an empty stomach, remain upright     ALPRAZolam 0.5 MG tablet  Commonly known as:  XANAX  Take 1 tablet (0.5 mg total) by mouth 2 (two) times daily.     amLODipine 2.5 MG tablet  Commonly known as:  NORVASC  Take 1 tablet (2.5 mg total) by mouth daily.     apixaban 5 MG Tabs tablet  Commonly known as:  ELIQUIS  Take 1 tablet (5 mg total) by mouth 2 (two) times daily.     diphenhydrAMINE 25 MG tablet  Commonly known as:  BENADRYL  Take 50 mg by mouth 2 (two) times daily as needed for allergies (eye allergies).     docusate sodium 100 MG capsule  Commonly known as:  COLACE  Take 1 capsule (100 mg total) by mouth 2 (two) times daily.     enalapril 10 MG tablet  Commonly known as:  VASOTEC  TAKE 1 BY MOUTH TWICE DAILY     ferrous sulfate 325 (65 FE) MG tablet  Take 1 tablet (325 mg total) by mouth 3 (three) times daily after meals.     Fish Oil 1000 MG Caps  Take 1-2 capsules by mouth 2 (two) times daily. Takes one in the morning and two capsules at night.     isosorbide mononitrate 30 MG 24 hr tablet  Commonly known as:  IMDUR  Take 1 tablet (30 mg total) by mouth daily.     loperamide 2 MG capsule  Commonly known as:  IMODIUM  Take 1 capsule (2 mg total) by mouth as needed for diarrhea or loose stools.     metoprolol tartrate 25 MG tablet   Commonly known as:  LOPRESSOR  Take 1 tablet (25 mg total) by mouth 2 (two) times daily.     nitroGLYCERIN 0.4 MG SL tablet  Commonly known as:  NITROSTAT  Place 1 tablet (0.4 mg total) under the tongue every 5 (five) minutes as needed for chest pain.     omeprazole 20 MG capsule  Commonly known as:  PRILOSEC  Take 1 capsule (20 mg total) by mouth daily.     oxyCODONE 5 MG immediate release tablet  Commonly known as:  Oxy IR/ROXICODONE  Take 1-3 tablets (5-15 mg total) by mouth every 4 (four) hours as needed for severe pain.     polyethylene glycol packet  Commonly known as:  MIRALAX / GLYCOLAX  Take 17 g by mouth 2 (two) times daily.     REFRESH OP  Apply 1 drop to eye 3 (three) times daily as needed (dry eyes).     rOPINIRole 0.25 MG tablet  Commonly known as:  REQUIP  Take 1 tablet (0.25 mg total) by mouth 3 (three) times daily.     simvastatin 40 MG tablet  Commonly known as:  ZOCOR  Take 1 tablet (40 mg total) by mouth daily at 6 PM.     tiZANidine 4 MG tablet  Commonly known as:  ZANAFLEX  Take 1 tablet (4 mg total) by mouth every 8 (eight) hours as needed for muscle spasms.     traZODone 50 MG tablet  Commonly known as:  DESYREL  Take 1 tablet (50 mg total) by mouth at bedtime.     vitamin B-12 100 MCG tablet  Commonly known as:  CYANOCOBALAMIN  Take  2,000 mcg by mouth 2 (two) times daily.     Vitamin D-3 1000 UNITS Caps  Take 2,000 Units by mouth 2 (two) times daily.         Signed: [place] Pugh. [name]   PA-C  10/11/2014, 8:44 AM

## 2014-10-30 ENCOUNTER — Telehealth: Payer: Self-pay | Admitting: Cardiovascular Disease

## 2014-10-30 DIAGNOSIS — I10 Essential (primary) hypertension: Secondary | ICD-10-CM

## 2014-10-30 MED ORDER — METOPROLOL TARTRATE 25 MG PO TABS: 37.5000 mg | ORAL_TABLET | Freq: Two times a day (BID) | ORAL | Status: DC

## 2014-10-30 NOTE — Telephone Encounter (Signed)
Called & spoke to pt. She had an episode last night of "heart pounding" she states came on suddenly while at rest. She equated this w/ CP & took 4-5 nitroglycerin. The episode resolved eventually.   Advised the patient that nitroglycerin probably would not be of help for an episode of rapid A fib & that HR would potentially stay up longer than desirable due to drug effect.   Suggested no med changes for now (she took BP while on phone, read 94/68 w/ rate of 100).  She states a little tired but attributes to ortho rehab appt she had just returned from.   Informed pt I would contact Dr. [name] for advice.

## 2014-10-30 NOTE — Telephone Encounter (Signed)
Pt wants to know what is a normal pulse rate? She says her pulse this morning is 103 and last night it was 144.

## 2014-10-30 NOTE — Telephone Encounter (Signed)
Agree she should not take NTG for rapid heart rate. She probably had paroxysmal AFib, which she has had before. Please ask to increase metoprolol to 37.5 mg BID and schedule f/u appt. Please record HR and BP after at least 10 minutes at rest.

## 2014-10-30 NOTE — Telephone Encounter (Signed)
Advised pt to increased metoprolol dose, to wait until rested for 10 mins before taking BP & HR readings. She voiced understanding w/ given instructions.  She has appt on books to see [place] 4/14.

## 2014-11-08 ENCOUNTER — Ambulatory Visit (INDEPENDENT_AMBULATORY_CARE_PROVIDER_SITE_OTHER): Payer: PPO | Admitting: Cardiovascular Disease

## 2014-11-08 ENCOUNTER — Encounter: Payer: Self-pay | Admitting: Cardiovascular Disease

## 2014-11-08 VITALS — BP 114/66 | HR 61 | Ht 61.5 in | Wt 136.9 lb

## 2014-11-08 DIAGNOSIS — R079 Chest pain, unspecified: Secondary | ICD-10-CM

## 2014-11-08 DIAGNOSIS — I10 Essential (primary) hypertension: Secondary | ICD-10-CM

## 2014-11-08 DIAGNOSIS — I25118 Atherosclerotic heart disease of native coronary artery with other forms of angina pectoris: Secondary | ICD-10-CM | POA: Diagnosis not present

## 2014-11-08 DIAGNOSIS — I519 Heart disease, unspecified: Secondary | ICD-10-CM

## 2014-11-08 DIAGNOSIS — I480 Paroxysmal atrial fibrillation: Secondary | ICD-10-CM

## 2014-11-08 NOTE — Patient Instructions (Signed)
STOP the Enalapril.  Call if your blood pressure goes back up.  Dr. [name] recommends that you schedule a follow-up appointment in: 6 months.

## 2014-11-08 NOTE — Progress Notes (Signed)
Patient ID: Jacqueline [name], female   DOB: [birth year], 81 y.o.   MRN: [id number]     Cardiology Office Note   Date:  11/10/2014   ID:  Jacqueline [name], DOB 08/17/1930, MRN [id number]  PCP:  Zigmund Gottron, MD  Cardiologist:   Sanda Klein, MD   Chief Complaint  Patient presents with  . Follow-up    2 months:  One episode of chest pain lasting about 30 minutes.  called the office and Metoprolol was increased. No complaints of SOB, edema or dizziness.      History of Present Illness: Jacqueline [name] is a 81 y.o. female who presents for CAD s/p coronary artery bypass surgery (LIMA to LAD and SVG to diagonal 2, Dr. Servando Snare; f/u cath 08/2014 atretic LIMA but good distal flow via the other conduits) and has normal left ventricular systolic function and a relatively recent normal nuclear perfusion study. She had highly symptomatic atrial fibrillation rapid ventricular response in August 2015 and underwent TEE-guided cardioversion followed by treatment with Eliquis. She does not have a history of stroke or TIA.   Last week she had an episode of palpitations: she thought was a coronary problem and  took several nitroglycerin tablets until the episode subsided. She did develop hypotension, but not particularly symptomatic. She did not have chest pain. We increased the dose of her beta blocker. No new events have occurred. 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 She had redo knee replacement surgery on 10/08/2014. She is already walking without assistive devices.  Past Medical History  Diagnosis Date  . Hypertension   . CAD (coronary artery disease)     a. s/p CABG x 2 (LIMA->LAD, VG->Diag);  b. 08/2014 Cath: LM nl, LAD 90p, LCX nl, RCA nl/dominant, LIMA->LAD atretic, VG->D2 patent w/ retrograde filling of LAD, EF  55-60%-->Med Rx.  Marland Kitchen Hyperlipemia   . Insomnia   . Diverticulitis 02/21/2012  . GERD (gastroesophageal reflux disease)   . Atrial fibrillation with rapid ventricular response 02/2014    a. CHA2DS2VASc = 6 -> on eliquis;  b. 02/2014 s/p DCCV;  c. 08/2014 Echo: EF 55-60%, Gr 2 DD, mild MR, triv AI.  Marland Kitchen Arthritis     back    Past Surgical History  Procedure Laterality Date  . Coronary artery bypass graft  2006    off-pump bypass surgery with LIMA to the LAD and SVG to the second diagonal artery ( performed by Dr Servando Snare)   . Tee without cardioversion N/A 02/28/2014    Procedure: TRANSESOPHAGEAL ECHOCARDIOGRAM (TEE);  Surgeon: Sanda Klein, MD;  Location: [facility];  Service: Cardiovascular;  Laterality: N/A;  . Cardioversion N/A 02/28/2014    Procedure: CARDIOVERSION;  Surgeon: Sanda Klein, MD;  Location: [facility];  Service: Cardiovascular;  Laterality: N/A;  . Cataract extraction Bilateral 2014    with lens implanted  . Abdominal hysterectomy  1975  . Rotator cuff repair Bilateral     r-1999, l- 2005  . Joint replacement Bilateral     L-2004, R-2006  . Total knee arthroplasty Right 07/02/2014    Procedure: Resection of Infected Right Total Knee Arthroplasty with placement antibiotic spacer;  Surgeon: Mauri Pole, MD;  Location: [facility];  Service: Orthopedics;  Laterality: Right;  . Left heart catheterization with coronary/graft angiogram N/A 09/17/2014    Procedure:  LEFT HEART CATHETERIZATION WITH Beatrix Fetters;  Surgeon: Troy Sine, MD;  Location: [facility] CATH LAB;  Service: Cardiovascular;  Laterality: N/A;  . Reimplantation of total knee Right 10/08/2014    Procedure: REIMPLANTATION OF RIGHT TOTAL KNEE ARTHROPLASTY WITH REMOVAL OF ANTIBIOTIC SPACER;  Surgeon: Paralee Cancel, MD;  Location: [facility];  Service: Orthopedics;  Laterality: Right;     Current Outpatient Prescriptions  Medication Sig Dispense Refill  . acetaminophen (TYLENOL) 500 MG tablet Take 500-1,000 mg by  mouth every 6 (six) hours as needed for moderate pain or headache.    . alendronate (FOSAMAX) 70 MG tablet Take 1 tablet (70 mg total) by mouth once a week. Take with a full glass of water on an empty stomach, remain upright (Patient taking differently: Take 70 mg by mouth once a week. Saturday) 12 tablet 3  . ALPRAZolam (XANAX) 0.5 MG tablet Take 1 tablet (0.5 mg total) by mouth 2 (two) times daily. 60 tablet 0  . amLODipine (NORVASC) 2.5 MG tablet Take 1 tablet (2.5 mg total) by mouth daily. 30 tablet 6  . apixaban (ELIQUIS) 5 MG TABS tablet Take 1 tablet (5 mg total) by mouth 2 (two) times daily. 180 tablet 3  . Cholecalciferol (VITAMIN D-3) 1000 UNITS CAPS Take 2,000 Units by mouth 2 (two) times daily.    . diphenhydrAMINE (BENADRYL) 25 MG tablet Take 50 mg by mouth 2 (two) times daily as needed for allergies (eye allergies).     . docusate sodium (COLACE) 100 MG capsule Take 1 capsule (100 mg total) by mouth 2 (two) times daily. 10 capsule 0  . ferrous sulfate 325 (65 FE) MG tablet Take 1 tablet (325 mg total) by mouth 3 (three) times daily after meals.  3  . isosorbide mononitrate (IMDUR) 30 MG 24 hr tablet Take 1 tablet (30 mg total) by mouth daily. 30 tablet 6  . loperamide (IMODIUM) 2 MG capsule Take 1 capsule (2 mg total) by mouth as needed for diarrhea or loose stools. 30 capsule 0  . metoprolol tartrate (LOPRESSOR) 25 MG tablet Take 1.5 tablets (37.5 mg total) by mouth 2 (two) times daily. 270 tablet 3  . nitroGLYCERIN (NITROSTAT) 0.4 MG SL tablet Place 1 tablet (0.4 mg total) under the tongue every 5 (five) minutes as needed for chest pain. 25 tablet 2  . Omega-3 Fatty Acids (FISH OIL) 1000 MG CAPS Take 1-2 capsules by mouth 2 (two) times daily. Takes one in the morning and two capsules at night.    Marland Kitchen omeprazole (PRILOSEC) 20 MG capsule Take 1 capsule (20 mg total) by mouth daily. (Patient taking differently: Take 20 mg by mouth every morning. ) 90 capsule 3  . polyethylene glycol  (MIRALAX / GLYCOLAX) packet Take 17 g by mouth 2 (two) times daily. 14 each 0  . Polyvinyl Alcohol-Povidone (REFRESH OP) Apply 1 drop to eye 3 (three) times daily as needed (dry eyes).    Marland Kitchen rOPINIRole (REQUIP) 0.25 MG tablet Take 1 tablet (0.25 mg total) by mouth 3 (three) times daily. 270 tablet 3  . simvastatin (ZOCOR) 40 MG tablet Take 1 tablet (40 mg total) by mouth daily at 6 PM. (Patient taking differently: Take 40 mg by mouth at bedtime. ) 90 tablet 3  . tiZANidine (ZANAFLEX) 4 MG tablet Take 1 tablet (4 mg total) by mouth every 8 (eight) hours as needed for muscle spasms. 50 tablet 4  . traMADol (ULTRAM) 50 MG tablet Take 50 mg by mouth 2 (two) times  daily.  1  . traZODone (DESYREL) 50 MG tablet Take 1 tablet (50 mg total) by mouth at bedtime. 90 tablet 3  . vitamin B-12 (CYANOCOBALAMIN) 100 MCG tablet Take 2,000 mcg by mouth 2 (two) times daily.     No current facility-administered medications for this visit.    Allergies:   Atorvastatin; Crestor; and Morphine and related    Social History:  The patient  reports that she has never smoked. She has never used smokeless tobacco. She reports that she does not drink alcohol or use illicit drugs.   Family History:  The patient's family history includes Parkinson's disease in her mother.    ROS:  Please see the history of present illness.    The patient specifically denies any chest pain  with exertion, dyspnea at rest or with exertion, orthopnea, paroxysmal nocturnal dyspnea, syncope, focal neurological deficits, intermittent claudication, lower extremity edema, unexplained weight gain, cough, hemoptysis or wheezing.  The patient also denies abdominal pain, nausea, vomiting, dysphagia, diarrhea, constipation, polyuria, polydipsia, dysuria, hematuria, frequency, urgency, abnormal bleeding or bruising, fever, chills, unexpected weight changes, mood swings, change in skin or hair texture, change in voice quality, auditory or visual problems,  allergic reactions or rashes, new musculoskeletal complaints other than usual "aches and pains".    All other systems are reviewed and negative.    PHYSICAL EXAM: VS:  BP 114/66 mmHg  Pulse 61  Ht 5' 1.5" (1.562 m)  Wt 136 lb 14.4 oz (62.097 kg)  BMI 25.45 kg/m2 , BMI Body mass index is 25.45 kg/(m^2).  General: Alert, oriented x3, no distress Head: no evidence of trauma, PERRL, EOMI, no exophtalmos or lid lag, no myxedema, no xanthelasma; normal ears, nose and oropharynx Neck: normal jugular venous pulsations and no hepatojugular reflux; brisk carotid pulses without delay and no carotid bruits Chest: clear to auscultation, no signs of consolidation by percussion or palpation, normal fremitus, symmetrical and full respiratory excursions Cardiovascular: normal position and quality of the apical impulse, regular rhythm, normal first and second heart sounds, no murmurs, rubs or gallops Abdomen: no tenderness or distention, no masses by palpation, no abnormal pulsatility or arterial bruits, normal bowel sounds, no hepatosplenomegaly Extremities: no clubbing, cyanosis or edema; 2+ radial, ulnar and brachial pulses bilaterally; 2+ right femoral, posterior tibial and dorsalis pedis pulses; 2+ left femoral, posterior tibial and dorsalis pedis pulses; no subclavian or femoral bruits. Rectal scar healing nicely, some residual swelling Neurological: grossly nonfocal Psych: euthymic mood, full affect   EKG:  EKG is ordered today. The ekg ordered today demonstrates NSR   Recent Labs: 09/15/2014: ALT 20; Magnesium 1.6; TSH 2.946 10/10/2014: BUN 21; Creatinine 1.30*; Potassium 4.3; Sodium 139 10/11/2014: Hemoglobin 11.2*; Platelets 116*    Lipid Panel    Component Value Date/Time   CHOL 132 09/16/2014 0423   TRIG 148 09/16/2014 0423   HDL 33* 09/16/2014 0423   CHOLHDL 4.0 09/16/2014 0423   VLDL 30 09/16/2014 0423   LDLCALC 69 09/16/2014 0423      Wt Readings from Last 3 Encounters:    11/08/14 136 lb 14.4 oz (62.097 kg)  10/08/14 139 lb (63.05 kg)  09/24/14 140 lb 6.4 oz (63.685 kg)      ASSESSMENT AND PLAN:  Hypercholesterolemia Good control: LDL cholesterol less than 70 and triglycerides less than 150.   HYPERTENSION, BENIGN SYSTEMIC  Good control, lower BP on higher dose metoprolol. Stop enalapril.  CORONARY, ARTERIOSCLEROSIS  Asymptomatic and with a fairly recent low risk nuclear perfusion study.  Diastolic dysfunction, left ventricle  No signs or symptoms of congestive heart failure  Paroxysmal atrial fibrillation 1 single episode in August of 2015. Tolerating anticoagulants without complications. Probably had another brief episode last week, metoprolol increased. I still don't think antiarrhythmics and their associated risks are justified.  Current medicines are reviewed at length with the patient today.  The patient has concerns regarding medicines, addressed above.  The following changes have been made:  Stop enalapril  Labs/ tests ordered today include:  Orders Placed This Encounter  Procedures  . EKG 12-Lead   Patient Instructions  STOP the Enalapril.  Call if your blood pressure goes back up.  Dr. [name] recommends that you schedule a follow-up appointment in: 6 months.      Mikael Spray, MD  11/10/2014 7:51 PM    Sanda Klein, MD, [facility] [phone] office [phone] pager

## 2014-11-14 ENCOUNTER — Ambulatory Visit (INDEPENDENT_AMBULATORY_CARE_PROVIDER_SITE_OTHER): Payer: PPO | Admitting: Family Medicine

## 2014-11-14 ENCOUNTER — Encounter: Payer: Self-pay | Admitting: Family Medicine

## 2014-11-14 VITALS — BP 162/77 | HR 66 | Temp 99.1°F | Wt 136.7 lb

## 2014-11-14 DIAGNOSIS — I10 Essential (primary) hypertension: Secondary | ICD-10-CM | POA: Diagnosis not present

## 2014-11-14 DIAGNOSIS — R5383 Other fatigue: Secondary | ICD-10-CM | POA: Diagnosis not present

## 2014-11-14 DIAGNOSIS — R109 Unspecified abdominal pain: Secondary | ICD-10-CM

## 2014-11-14 DIAGNOSIS — R197 Diarrhea, unspecified: Secondary | ICD-10-CM | POA: Diagnosis not present

## 2014-11-14 DIAGNOSIS — I480 Paroxysmal atrial fibrillation: Secondary | ICD-10-CM | POA: Diagnosis not present

## 2014-11-14 LAB — COMPREHENSIVE METABOLIC PANEL
ALBUMIN: 4.2 g/dL (ref 3.5–5.2)
ALK PHOS: 61 U/L (ref 39–117)
ALT: 11 U/L (ref 0–35)
AST: 17 U/L (ref 0–37)
BUN: 20 mg/dL (ref 6–23)
CO2: 23 mEq/L (ref 19–32)
Calcium: 9.8 mg/dL (ref 8.4–10.5)
Chloride: 105 mEq/L (ref 96–112)
Creat: 1.17 mg/dL — ABNORMAL HIGH (ref 0.50–1.10)
Glucose, Bld: 94 mg/dL (ref 70–99)
POTASSIUM: 4.6 meq/L (ref 3.5–5.3)
SODIUM: 140 meq/L (ref 135–145)
Total Bilirubin: 0.4 mg/dL (ref 0.2–1.2)
Total Protein: 6.6 g/dL (ref 6.0–8.3)

## 2014-11-14 LAB — CBC
HCT: 34.1 % — ABNORMAL LOW (ref 36.0–46.0)
Hemoglobin: 11.1 g/dL — ABNORMAL LOW (ref 12.0–15.0)
MCH: 30 pg (ref 26.0–34.0)
MCHC: 32.6 g/dL (ref 30.0–36.0)
MCV: 92.2 fL (ref 78.0–100.0)
MPV: 11.8 fL (ref 8.6–12.4)
PLATELETS: 215 10*3/uL (ref 150–400)
RBC: 3.7 MIL/uL — ABNORMAL LOW (ref 3.87–5.11)
RDW: 14.8 % (ref 11.5–15.5)
WBC: 5.1 10*3/uL (ref 4.0–10.5)

## 2014-11-14 MED ORDER — METOPROLOL TARTRATE 50 MG PO TABS: 50.0000 mg | ORAL_TABLET | Freq: Two times a day (BID) | ORAL | Status: DC

## 2014-11-14 NOTE — Patient Instructions (Addendum)
Please take 50 mg of metoprolol twice a day.  Use up your old pills by taking two, twice a day.  I am sending in a new prescription for 50mg  tabs sot the new prescription will be one pill twice a day. I am sorry you are feeling so bad. I do not want to make any other changes until I get these lab results back.

## 2014-11-15 ENCOUNTER — Encounter: Payer: Self-pay | Admitting: Family Medicine

## 2014-11-15 DIAGNOSIS — R5383 Other fatigue: Secondary | ICD-10-CM | POA: Insufficient documentation

## 2014-11-15 DIAGNOSIS — R531 Weakness: Secondary | ICD-10-CM

## 2014-11-15 NOTE — Progress Notes (Signed)
   Subjective:    Patient ID: Jacqueline [name], female    DOB: 03/29/1931, 81 y.o.   MRN: [id number]  HPI It has been a tough six months for Jacqueline [name].  She had an right Total knee replacement infection then removal with long term antibiotics then replacement again with short stay in SNF for rehab.  She emerges with two main complaints. 1. Fatigue, lack of energy.  This has been a problem for months.  No symptoms that point in any particular direction.  Of note, she has been anemic in the past and is on eliquis. 2. Cramping lower abd pain and some diarrhea.  A persistent problem since she finished her long term antibiotic course. She was treated with Vanc which is less likely for c diff, but clearly has been in the hospital setting. Reviewed labs, C dif toxin not tested.  She really does not have depressive symptoms beyond the fatigue.  She does live alone and the last 6 months have been hard.   Still, no vegetative sx.  Also, recent normal TSH.    Review of Systems     Objective:   Physical ExamLungs clear Neck no nodes or thyromegally Cardiac RRR without m or g abd benign Skin not pale.        Assessment & Plan:

## 2014-11-15 NOTE — Assessment & Plan Note (Signed)
I am pleased with normal wt (and now that labs are back, normal albumin.)  I am concerned that she is at risk for C diff and will test.

## 2014-11-15 NOTE — Assessment & Plan Note (Signed)
Broad diferential.  Will start with screening labs and see from there.

## 2014-11-16 ENCOUNTER — Encounter: Payer: Self-pay | Admitting: Family Medicine

## 2014-11-16 NOTE — Progress Notes (Signed)
Jacqueline [name] is leaving a specimen; I am giving it to the lab. Sadie Reynolds, ASA

## 2014-11-19 ENCOUNTER — Other Ambulatory Visit: Payer: Self-pay | Admitting: Family Medicine

## 2014-11-19 MED ORDER — ALPRAZOLAM 0.5 MG PO TABS: 0.5000 mg | ORAL_TABLET | Freq: Two times a day (BID) | ORAL | Status: DC | PRN

## 2014-11-20 ENCOUNTER — Other Ambulatory Visit: Payer: Self-pay | Admitting: Family Medicine

## 2014-11-20 NOTE — Addendum Note (Signed)
Addended by: [place], Gaurav Baldree on: 11/20/2014 02:22 PM   Modules accepted: Orders

## 2014-11-21 ENCOUNTER — Encounter: Payer: Self-pay | Admitting: Family Medicine

## 2014-11-21 DIAGNOSIS — R197 Diarrhea, unspecified: Secondary | ICD-10-CM

## 2014-11-21 LAB — C. DIFFICILE GDH AND TOXIN A/B
C. DIFF TOXIN A/B: NOT DETECTED
C. difficile GDH: NOT DETECTED

## 2014-11-21 NOTE — Assessment & Plan Note (Signed)
C diff neg.  Discussed with patient.  Will treat symptomatically with immodium AD OTC and follow up in one month to see if she needs fruther testing.

## 2015-01-03 ENCOUNTER — Other Ambulatory Visit: Payer: Self-pay | Admitting: Family Medicine

## 2015-01-03 MED ORDER — TRAZODONE HCL 50 MG PO TABS: 50.0000 mg | ORAL_TABLET | Freq: Every day | ORAL | Status: DC

## 2015-01-03 MED ORDER — ROPINIROLE HCL 0.25 MG PO TABS: 0.2500 mg | ORAL_TABLET | Freq: Three times a day (TID) | ORAL | Status: DC

## 2015-01-03 MED ORDER — SIMVASTATIN 40 MG PO TABS: 20.0000 mg | ORAL_TABLET | Freq: Every day | ORAL | Status: DC

## 2015-01-03 NOTE — Telephone Encounter (Signed)
Pt is calling for refills on her medications Requip, Zocor, and Trazodone. She also has a Archivist. [facility] [address]. jw

## 2015-01-16 ENCOUNTER — Telehealth: Payer: Self-pay | Admitting: Family Medicine

## 2015-01-16 DIAGNOSIS — I10 Essential (primary) hypertension: Secondary | ICD-10-CM

## 2015-01-16 NOTE — Telephone Encounter (Signed)
Called.  Doubt eliquis is causing.  It is worth rechecking CBC since on blood thinner.  She had a TSH in the last 6 months.  She will come in for lab work.

## 2015-01-16 NOTE — Telephone Encounter (Signed)
Is freezing all the time. Thinks this is caused by Eliquis. Pharmacy says her iron level may be low Please advise

## 2015-01-30 ENCOUNTER — Other Ambulatory Visit: Payer: Self-pay | Admitting: *Deleted

## 2015-01-30 ENCOUNTER — Telehealth: Payer: Self-pay | Admitting: Cardiovascular Disease

## 2015-01-30 MED ORDER — FUROSEMIDE 20 MG PO TABS: 20.0000 mg | ORAL_TABLET | Freq: Every day | ORAL | Status: DC

## 2015-01-30 NOTE — Telephone Encounter (Signed)
Please have her take furosemide 20 mg daily for 3 days , call us back if symptoms do not improve. Give her a prescription for 30 tablets, but advise not to take more than 3 days in a row without consulting with [place].

## 2015-01-30 NOTE — Telephone Encounter (Signed)
Pt. Called and informed about taking lasix for three days and to call us if not better

## 2015-01-30 NOTE — Telephone Encounter (Signed)
Pt. States she is having a lot of swelling in here ankles and feet , and has gained 5 lbs since last Thursday , also states her BP this morning was 150/66 . Pt. Is not on any diuretics. Please advise

## 2015-01-30 NOTE — Telephone Encounter (Signed)
Mrs. Hoecker is calling because her bp is elevated and is having swelling in her legs and ankles . Please call    Thanks

## 2015-02-11 ENCOUNTER — Telehealth: Payer: Self-pay | Admitting: Cardiovascular Disease

## 2015-02-11 NOTE — Telephone Encounter (Signed)
Jacqueline [name] is calling because her bp levels are 145/100 and pulse is 99 .Marland Kitchen Please call   Thanks

## 2015-02-11 NOTE — Telephone Encounter (Signed)
Pt reports BP levels up, 145/100 and pulse of 99, has been this way more or less all day. She took extra metoprolol to see if resolved HR, so far hasn't.  I did advise these levels probably OK unless symptoms. She states chest tightness today that started in AM. Advised Nitro SL up to x3 5 mins apart - if pain does not resolve seek care and evaluation in ED.  Pt verbalized understanding of instructions.

## 2015-02-17 ENCOUNTER — Observation Stay (HOSPITAL_COMMUNITY): Payer: PPO

## 2015-02-17 ENCOUNTER — Telehealth: Payer: Self-pay | Admitting: Obstetrics and Gynecology

## 2015-02-17 ENCOUNTER — Encounter (HOSPITAL_COMMUNITY): Payer: Self-pay | Admitting: Emergency Medicine

## 2015-02-17 ENCOUNTER — Observation Stay (HOSPITAL_COMMUNITY)
Admission: EM | Admit: 2015-02-17 | Discharge: 2015-02-19 | Disposition: A | Discharge status: Home / Self Care | Payer: PPO | Attending: Family Medicine | Admitting: Family Medicine

## 2015-02-17 DIAGNOSIS — Z951 Presence of aortocoronary bypass graft: Secondary | ICD-10-CM | POA: Insufficient documentation

## 2015-02-17 DIAGNOSIS — K921 Melena: Secondary | ICD-10-CM | POA: Insufficient documentation

## 2015-02-17 DIAGNOSIS — K5730 Diverticulosis of large intestine without perforation or abscess without bleeding: Secondary | ICD-10-CM | POA: Diagnosis not present

## 2015-02-17 DIAGNOSIS — I2510 Atherosclerotic heart disease of native coronary artery without angina pectoris: Secondary | ICD-10-CM | POA: Insufficient documentation

## 2015-02-17 DIAGNOSIS — I5032 Chronic diastolic (congestive) heart failure: Secondary | ICD-10-CM | POA: Diagnosis not present

## 2015-02-17 DIAGNOSIS — M1990 Unspecified osteoarthritis, unspecified site: Secondary | ICD-10-CM | POA: Insufficient documentation

## 2015-02-17 DIAGNOSIS — I129 Hypertensive chronic kidney disease with stage 1 through stage 4 chronic kidney disease, or unspecified chronic kidney disease: Secondary | ICD-10-CM | POA: Diagnosis not present

## 2015-02-17 DIAGNOSIS — N182 Chronic kidney disease, stage 2 (mild): Secondary | ICD-10-CM | POA: Diagnosis not present

## 2015-02-17 DIAGNOSIS — I5033 Acute on chronic diastolic (congestive) heart failure: Secondary | ICD-10-CM | POA: Diagnosis not present

## 2015-02-17 DIAGNOSIS — E780 Pure hypercholesterolemia: Secondary | ICD-10-CM | POA: Insufficient documentation

## 2015-02-17 DIAGNOSIS — K648 Other hemorrhoids: Secondary | ICD-10-CM | POA: Insufficient documentation

## 2015-02-17 DIAGNOSIS — K922 Gastrointestinal hemorrhage, unspecified: Principal | ICD-10-CM | POA: Diagnosis present

## 2015-02-17 DIAGNOSIS — E785 Hyperlipidemia, unspecified: Secondary | ICD-10-CM | POA: Insufficient documentation

## 2015-02-17 DIAGNOSIS — K625 Hemorrhage of anus and rectum: Secondary | ICD-10-CM

## 2015-02-17 DIAGNOSIS — Z96651 Presence of right artificial knee joint: Secondary | ICD-10-CM | POA: Insufficient documentation

## 2015-02-17 DIAGNOSIS — N189 Chronic kidney disease, unspecified: Secondary | ICD-10-CM | POA: Diagnosis not present

## 2015-02-17 DIAGNOSIS — I4891 Unspecified atrial fibrillation: Secondary | ICD-10-CM

## 2015-02-17 DIAGNOSIS — D500 Iron deficiency anemia secondary to blood loss (chronic): Secondary | ICD-10-CM | POA: Insufficient documentation

## 2015-02-17 DIAGNOSIS — R0609 Other forms of dyspnea: Secondary | ICD-10-CM | POA: Insufficient documentation

## 2015-02-17 DIAGNOSIS — G4700 Insomnia, unspecified: Secondary | ICD-10-CM | POA: Diagnosis not present

## 2015-02-17 DIAGNOSIS — K219 Gastro-esophageal reflux disease without esophagitis: Secondary | ICD-10-CM | POA: Diagnosis not present

## 2015-02-17 DIAGNOSIS — R0600 Dyspnea, unspecified: Secondary | ICD-10-CM | POA: Diagnosis not present

## 2015-02-17 DIAGNOSIS — N179 Acute kidney failure, unspecified: Secondary | ICD-10-CM | POA: Diagnosis not present

## 2015-02-17 DIAGNOSIS — I10 Essential (primary) hypertension: Secondary | ICD-10-CM | POA: Insufficient documentation

## 2015-02-17 DIAGNOSIS — Z955 Presence of coronary angioplasty implant and graft: Secondary | ICD-10-CM | POA: Diagnosis not present

## 2015-02-17 DIAGNOSIS — K5731 Diverticulosis of large intestine without perforation or abscess with bleeding: Secondary | ICD-10-CM | POA: Insufficient documentation

## 2015-02-17 LAB — CBC WITH DIFFERENTIAL/PLATELET
Basophils Absolute: 0 10*3/uL (ref 0.0–0.1)
Basophils Relative: 0 % (ref 0–1)
Eosinophils Absolute: 0.2 10*3/uL (ref 0.0–0.7)
Eosinophils Relative: 2 % (ref 0–5)
HCT: 31 % — ABNORMAL LOW (ref 36.0–46.0)
Hemoglobin: 10.1 g/dL — ABNORMAL LOW (ref 12.0–15.0)
LYMPHS ABS: 1.3 10*3/uL (ref 0.7–4.0)
LYMPHS PCT: 19 % (ref 12–46)
MCH: 30.3 pg (ref 26.0–34.0)
MCHC: 32.6 g/dL (ref 30.0–36.0)
MCV: 93.1 fL (ref 78.0–100.0)
Monocytes Absolute: 0.4 10*3/uL (ref 0.1–1.0)
Monocytes Relative: 6 % (ref 3–12)
NEUTROS ABS: 5 10*3/uL (ref 1.7–7.7)
NEUTROS PCT: 73 % (ref 43–77)
PLATELETS: 171 10*3/uL (ref 150–400)
RBC: 3.33 MIL/uL — AB (ref 3.87–5.11)
RDW: 13.3 % (ref 11.5–15.5)
WBC: 6.9 10*3/uL (ref 4.0–10.5)

## 2015-02-17 LAB — BASIC METABOLIC PANEL
ANION GAP: 10 (ref 5–15)
BUN: 21 mg/dL — ABNORMAL HIGH (ref 6–20)
CALCIUM: 9.3 mg/dL (ref 8.9–10.3)
CO2: 22 mmol/L (ref 22–32)
Chloride: 108 mmol/L (ref 101–111)
Creatinine, Ser: 1.55 mg/dL — ABNORMAL HIGH (ref 0.44–1.00)
GFR calc Af Amer: 35 mL/min — ABNORMAL LOW (ref >60)
GFR, EST NON AFRICAN AMERICAN: 30 mL/min — AB (ref >60)
GLUCOSE: 100 mg/dL — AB (ref 65–99)
Potassium: 4.2 mmol/L (ref 3.5–5.1)
Sodium: 140 mmol/L (ref 135–145)

## 2015-02-17 LAB — TROPONIN I: Troponin I: <0.03 ng/mL (ref <0.031)

## 2015-02-17 LAB — TYPE AND SCREEN
ABO/RH(D): O NEG
ANTIBODY SCREEN: NEGATIVE

## 2015-02-17 LAB — POC OCCULT BLOOD, ED: Fecal Occult Bld: POSITIVE — AB

## 2015-02-17 LAB — BRAIN NATRIURETIC PEPTIDE: B Natriuretic Peptide: 400.3 pg/mL — ABNORMAL HIGH (ref 0.0–100.0)

## 2015-02-17 MED ORDER — PANTOPRAZOLE SODIUM 40 MG PO TBEC
40.0000 mg | DELAYED_RELEASE_TABLET | Freq: Every day | ORAL | Status: DC
  Administered 2015-02-18 – 2015-02-19 (×2): 40 mg via ORAL
  Filled 2015-02-17 (×2): qty 1

## 2015-02-17 MED ORDER — ACETAMINOPHEN 500 MG PO TABS: 500.0000 mg | ORAL_TABLET | Freq: Four times a day (QID) | ORAL | Status: DC | PRN

## 2015-02-17 MED ORDER — NITROGLYCERIN 0.4 MG SL SUBL: 0.4000 mg | SUBLINGUAL_TABLET | SUBLINGUAL | Status: DC | PRN

## 2015-02-17 MED ORDER — ROPINIROLE HCL 0.25 MG PO TABS
0.2500 mg | ORAL_TABLET | Freq: Three times a day (TID) | ORAL | Status: DC
  Administered 2015-02-17 – 2015-02-19 (×7): 0.25 mg via ORAL
  Filled 2015-02-17 (×8): qty 1

## 2015-02-17 MED ORDER — SODIUM CHLORIDE 0.9 % IV SOLN
INTRAVENOUS | Status: DC
  Administered 2015-02-17: 13:00:00 via INTRAVENOUS

## 2015-02-17 MED ORDER — FUROSEMIDE 10 MG/ML IJ SOLN
20.0000 mg | Freq: Once | INTRAMUSCULAR | Status: AC
  Administered 2015-02-17: 20 mg via INTRAVENOUS
  Filled 2015-02-17: qty 2

## 2015-02-17 MED ORDER — AMLODIPINE BESYLATE 2.5 MG PO TABS
2.5000 mg | ORAL_TABLET | Freq: Every day | ORAL | Status: DC
  Administered 2015-02-18 – 2015-02-19 (×2): 2.5 mg via ORAL
  Filled 2015-02-17 (×2): qty 1

## 2015-02-17 MED ORDER — SIMVASTATIN 20 MG PO TABS
20.0000 mg | ORAL_TABLET | Freq: Every day | ORAL | Status: DC
  Administered 2015-02-17 – 2015-02-18 (×2): 20 mg via ORAL
  Filled 2015-02-17 (×3): qty 1

## 2015-02-17 MED ORDER — METOPROLOL TARTRATE 50 MG PO TABS
50.0000 mg | ORAL_TABLET | Freq: Two times a day (BID) | ORAL | Status: DC
  Administered 2015-02-17 – 2015-02-19 (×4): 50 mg via ORAL
  Filled 2015-02-17 (×5): qty 1

## 2015-02-17 MED ORDER — ISOSORBIDE MONONITRATE ER 30 MG PO TB24
30.0000 mg | ORAL_TABLET | Freq: Every day | ORAL | Status: DC
  Administered 2015-02-18 – 2015-02-19 (×2): 30 mg via ORAL
  Filled 2015-02-17 (×2): qty 1

## 2015-02-17 MED ORDER — TRAZODONE HCL 50 MG PO TABS
50.0000 mg | ORAL_TABLET | Freq: Every day | ORAL | Status: DC
  Administered 2015-02-17 – 2015-02-18 (×2): 50 mg via ORAL
  Filled 2015-02-17 (×3): qty 1

## 2015-02-17 NOTE — Progress Notes (Signed)
Consultation   History of Present Illness:  This is a 81 yo WF known to me from 2007 colonoscopy for heme positive stool attributed to internal hemorrhoids. She also was noted to have diverticulosis. She is admited today with large volume painless hematochezia, at least 3 large bloody BM's x 24 hours, last stool about 1 hour ago was very small. Pt has been on Eliquis for at.fib. She was hospitalized for diverticulitis about 2 yrs ago. Pt denies black tarry stools, N&V , dysphagia, weight loss. She had hyperplastic polyps  In 2007. No recall scheduled due to age.Hx of remote hemorrhoidectomy 30 years ago by Dr Allyson Sabal.She sees occasional blood per rectum   Past Medical History  Diagnosis Date  . Hypertension   . CAD (coronary artery disease)     a. s/p CABG x 2 (LIMA->LAD, VG->Diag);  b. 08/2014 Cath: LM nl, LAD 90p, LCX nl, RCA nl/dominant, LIMA->LAD atretic, VG->D2 patent w/ retrograde filling of LAD, EF 55-60%-->Med Rx.  Marland Kitchen Hyperlipemia   . Insomnia   . Diverticulitis 02/21/2012  . GERD (gastroesophageal reflux disease)   . Atrial fibrillation with rapid ventricular response 02/2014    a. CHA2DS2VASc = 6 -> on eliquis;  b. 02/2014 s/p DCCV;  c. 08/2014 Echo: EF 55-60%, Gr 2 DD, mild MR, triv AI.  Marland Kitchen Arthritis     back   Past Surgical History  Procedure Laterality Date  . Coronary artery bypass graft  2006    off-pump bypass surgery with LIMA to the LAD and SVG to the second diagonal artery ( performed by Dr Servando Snare)   . Tee without cardioversion N/A 02/28/2014    Procedure: TRANSESOPHAGEAL ECHOCARDIOGRAM (TEE);  Surgeon: Sanda Klein, MD;  Location: [place];  Service: Cardiovascular;  Laterality: N/A;  . Cardioversion N/A 02/28/2014    Procedure: CARDIOVERSION;  Surgeon: Sanda Klein, MD;  Location: [facility];  Service: Cardiovascular;  Laterality: N/A;  . Cataract extraction Bilateral 2014    with lens implanted  . Abdominal hysterectomy  1975  . Rotator cuff repair  Bilateral     r-1999, l- 2005  . Joint replacement Bilateral     L-2004, R-2006  . Total knee arthroplasty Right 07/02/2014    Procedure: Resection of Infected Right Total Knee Arthroplasty with placement antibiotic spacer;  Surgeon: Mauri Pole, MD;  Location: [facility];  Service: Orthopedics;  Laterality: Right;  . Left heart catheterization with coronary/graft angiogram N/A 09/17/2014    Procedure: LEFT HEART CATHETERIZATION WITH Beatrix Fetters;  Surgeon: Troy Sine, MD;  Location: [facility] CATH LAB;  Service: Cardiovascular;  Laterality: N/A;  . Reimplantation of total knee Right 10/08/2014    Procedure: REIMPLANTATION OF RIGHT TOTAL KNEE ARTHROPLASTY WITH REMOVAL OF ANTIBIOTIC SPACER;  Surgeon: Paralee Cancel, MD;  Location: [facility];  Service: Orthopedics;  Laterality: Right;    reports that she has never smoked. She has never used smokeless tobacco. She reports that she does not drink alcohol or use illicit drugs. family history includes Parkinson's disease in her mother. Allergies  Allergen Reactions  . Atorvastatin Other (See Comments)    Myalgias in legs  . Crestor [Rosuvastatin] Other (See Comments)    Myalgias in legs  . Morphine And Related Other (See Comments)    "Crazy thoughts, severe headache, sick"        Review of Systems: neg for N&V, wght loss,  The remainder of the 10 point ROS is negative except as outlined in H&P   Physical Exam: General appearance  Pleasant female in no distress. Eyes- non icteric. HEENT nontraumatic, normocephalic. Mouth no lesions, tongue papillated, no cheilosis. Neck supple without adenopathy, thyroid not enlarged, no carotid bruits, no JVD. Lungs Clear to auscultation bilaterally. Cor normal S1, normal S2, regular rhythm, no murmur,  quiet precordium. Abdomen: soft, active bowl sounds, non tender, liver edge at CM, slightly tympanitic Rectal:maroon/red stool heme positive Extremities no pedal edema. Skin no  lesions. Neurological alert and oriented x 3. Psychological normal mood and affect.  Assessment and Plan:  81 yo WF with large volume painless hematochezia c/w diverticular bleed,  Hgb just slightly down but may equilibrate, she appears to be hemodynamically stable. She has been anticoagulated with Eliquis and it will take 5 days for coags to get back to normal. I recommend observation, bowl rest( clears), and if bleeding recurrs, will order Nuclear medicine RBC pool scan. This is not likely a hemorrhoidal bleed. Also unlikely UGIB- BUN 21/creat 1.35. In the future we may reconssider risks and benefits of long term anticoag., would leave it open now.   02/17/2015 Delfin Edis

## 2015-02-17 NOTE — ED Notes (Signed)
Pt sts bright red rectal bleeding starting yesterday and now having SOB with trouble getting deep breath per pt; pt sts takes blood thinner

## 2015-02-17 NOTE — H&P (Signed)
[facility] Admission History and Physical Service Pager: [phone]  Patient name: Jacqueline [name] Medical record number: [id number] Date of birth: [birth year] Age: 81 y.o. Gender: female  Primary Care Provider: Zigmund Gottron, MD Consultants: GI Code Status: full  Chief Complaint: GI bleeding (BRBPR)  Assessment and Plan: Jacqueline [name] is a 81 y.o. female presenting with BRBPR . PMH is significant for AFib, dCHF, HTN, HLD, anemia & insomnia  Rectal bleeding: BRBPR x2 days. No active bleeding since 7 am this morning. FOBT positive in ED but no active bleeding noticed on rectal exam. Hgb 10.1, about baseline. She had colonoscopy in 2007 that showed multiple rectosigmoid polyps, diverticulosis in descending and sigmoid colon, and grade 1 hypervascular internal hemorrhoid. Pathology at that time showed hyperplastic polyps without adenomatous mucosa. She is now on eliquis for afib. DDx includes internal hemorrhoids and diverticulosis are likely given no abdominal pain and precious colonoscopy finding. Patient had similar episode of bleeding in remote past from internal hemorrhoid. Other possible causes are colon cancer also no family hx & constitutional sxs. Patient hemodynamically stable. -Admit to floor, attending Dr. Gwendlyn Deutscher -Two IV lines (18 guages) - Hold eliquis  - transfuse if  Hgb <7 or becomes unstable - CBC in AM - PT/INR - GI consult for possible scope  Dyspnea: for two weeks but worse over the last two days. SOB is exertional. Denies chest pain. H/o dCHF. Lung and heart exam unremarkable. No sign of infection. Less likely to be PE while on eliquis. There is no tachycardia or tachypnea either. She doesn't appear fluid overloaded. No oxygen requirement at this time - CXR showing pulmonary edema and trace effusions.  - Supposed to be on home PO lasix daily but has not been taking - Will discontinue IVF started in ED and give one time dose of IV Lasix -  oxygen as needed to maintain sats  Anemia: Hgb of 10.1 today (about baseline). MCV 93. Likely anemia of chronic diease based on MCV. Patient asymptomatic.  - will monitor H&H - will transfuse as needed  Elevated creatinine/ CKD-II: sCr 1.55. B/l 1-1.3. Has an AKI. Unsure of source at this time. Does not appear prerenal or postrenal. Received fluids in ED. Maybe some component of cardiorenal syndrome. - Recheck BMP in AM - stop IVF in setting of pulmonary edema; giving lasix for this. Aware that Cr will likely increase - avoid nephrotoxic agents  Afib: on elquis 5 mg daily and metoprolol 50 mg at home - hold eliquis - continue home metoprolol  HTN: Normotensive.  -Continue home meds  dCHF: EF 55-60 with grade 2 DD in 08/2014. On amlodipine 2.5 mg, IMDUR 30 mg, Metoprolol 50 mg bid at home. Denies taking lasix 20 daily even though on medication list. Unsure what baseline wt appears to be ~137.  - hold lasix after IV 1x dose - consider repeating echo - continue home meds  Hyperlipidemia: lipid panel CH 132, TG 148, LDL 82 HDL 33 in 08/2014. On simvastatin 20 mg at home. - will continue statin  Insomnia: on ativan 0.5 mg and trazodone 50 mg at home - will continue home trazodone prn  FEN/GI: SLIV, NPO pending GI, Protonix Prophylaxis: SCDs  Disposition: pending GI recs  History of Present Illness: Jacqueline [name] is a 81 y.o. female presenting with BRBPR. PMH is significant for AFib, CAD, HTN, HLD, anemia & insomnia.  Patient states that starting yesterday she had episode of bright red bleeding per rectum. It "filled  the commode". Bleeding lasted about 10 min. She went on about her day but had 2 more similar episodes of bleeding. She had to wear a pad. She called [facility] after hours emergency line and stated she had another episode of bright red blood this morning about 7 am. It was recommended that she come in to ED be evaluated. Patient had surgery 30 years ago for internal  hemorrhoids. She had bleeding similar to this one at that time. At that time her Hgb was down to 5 and she had to receive 3 units of blood.   She also endorses SOB with walking for the last two weeks that has gotten worse over the last two days. She is not SOB lying flat. She has hx of Afib but no hx of CHF or CAD.   She denies chest pain, n/v/d/c. She denies dizziness, fever or recent illness. She reports chill but feels cold at base line.   ED course: CBC stable with Hgb 10.1 (at b/l), BMP with sCr 1.55 (b/ll 1-1.3), FOBT +  Review Of Systems: Per HPI.  Patient Active Problem List   Diagnosis Date Noted  . GI bleeding 02/17/2015  . Fatigue 11/15/2014  . Diarrhea 11/14/2014  . Acute blood loss anemia 10/10/2014  . Overweight (BMI 25.0-29.9) 10/10/2014  . S/P knee replacement 10/08/2014  . PAF (paroxysmal atrial fibrillation) 09/24/2014  . Unstable angina 09/15/2014  . Abdominal cramping 08/10/2014  . Pruritus 08/10/2014  . Post-operative pain 08/10/2014  . S/P right TKA removal and spacer placement 07/02/2014  . Osteoporosis, unspecified 03/22/2014  . A-fib 02/27/2014  . GERD (gastroesophageal reflux disease)   . Chronic kidney disease, stage 2, mildly decreased GFR 12/22/2013  . Abnormal thyroid blood test 12/22/2013  . Diastolic dysfunction, left ventricle 07/22/2011  . HYPERCHOLESTEROLEMIA 09/23/2006  . ANEMIA, OTHER, UNSPECIFIED 09/23/2006  . HYPERTENSION, BENIGN SYSTEMIC 09/23/2006  . Coronary atherosclerosis 09/23/2006  . RHINITIS, ALLERGIC 09/23/2006  . REFLUX ESOPHAGITIS 09/23/2006  . DIVERTICULOSIS OF COLON 09/23/2006  . OSTEOARTHRITIS, MULTI SITES 09/23/2006  . BACK PAIN, LOW 09/23/2006  . INSOMNIA NOS 09/23/2006   Past Medical History: Past Medical History  Diagnosis Date  . Hypertension   . CAD (coronary artery disease)     a. s/p CABG x 2 (LIMA->LAD, VG->Diag);  b. 08/2014 Cath: LM nl, LAD 90p, LCX nl, RCA nl/dominant, LIMA->LAD atretic, VG->D2 patent w/  retrograde filling of LAD, EF 55-60%-->Med Rx.  Marland Kitchen Hyperlipemia   . Insomnia   . Diverticulitis 02/21/2012  . GERD (gastroesophageal reflux disease)   . Atrial fibrillation with rapid ventricular response 02/2014    a. CHA2DS2VASc = 6 -> on eliquis;  b. 02/2014 s/p DCCV;  c. 08/2014 Echo: EF 55-60%, Gr 2 DD, mild MR, triv AI.  Marland Kitchen Arthritis     back   Past Surgical History: Past Surgical History  Procedure Laterality Date  . Coronary artery bypass graft  2006    off-pump bypass surgery with LIMA to the LAD and SVG to the second diagonal artery ( performed by Dr Servando Snare)   . Tee without cardioversion N/A 02/28/2014    Procedure: TRANSESOPHAGEAL ECHOCARDIOGRAM (TEE);  Surgeon: Sanda Klein, MD;  Location: [place];  Service: Cardiovascular;  Laterality: N/A;  . Cardioversion N/A 02/28/2014    Procedure: CARDIOVERSION;  Surgeon: Sanda Klein, MD;  Location: [facility];  Service: Cardiovascular;  Laterality: N/A;  . Cataract extraction Bilateral 2014    with lens implanted  . Abdominal hysterectomy  1975  . Rotator  cuff repair Bilateral     r-1999, l- 2005  . Joint replacement Bilateral     L-2004, R-2006  . Total knee arthroplasty Right 07/02/2014    Procedure: Resection of Infected Right Total Knee Arthroplasty with placement antibiotic spacer;  Surgeon: Mauri Pole, MD;  Location: [facility];  Service: Orthopedics;  Laterality: Right;  . Left heart catheterization with coronary/graft angiogram N/A 09/17/2014    Procedure: LEFT HEART CATHETERIZATION WITH Beatrix Fetters;  Surgeon: Troy Sine, MD;  Location: [facility] CATH LAB;  Service: Cardiovascular;  Laterality: N/A;  . Reimplantation of total knee Right 10/08/2014    Procedure: REIMPLANTATION OF RIGHT TOTAL KNEE ARTHROPLASTY WITH REMOVAL OF ANTIBIOTIC SPACER;  Surgeon: Paralee Cancel, MD;  Location: [facility];  Service: Orthopedics;  Laterality: Right;   Social History: History  Substance Use Topics  . Smoking status: Never Smoker    . Smokeless tobacco: Never Used  . Alcohol Use: No   Additional social history: Lives alone. Please also refer to relevant sections of EMR.  Family History: Family History  Problem Relation Age of Onset  . Parkinson's disease Mother    Allergies and Medications: Allergies  Allergen Reactions  . Atorvastatin Other (See Comments)    Myalgias in legs  . Crestor [Rosuvastatin] Other (See Comments)    Myalgias in legs  . Morphine And Related Other (See Comments)    "Crazy thoughts, severe headache, sick"   No current facility-administered medications on file prior to encounter.   Current Outpatient Prescriptions on File Prior to Encounter  Medication Sig Dispense Refill  . acetaminophen (TYLENOL) 500 MG tablet Take 500-1,000 mg by mouth every 6 (six) hours as needed for moderate pain or headache.    . alendronate (FOSAMAX) 70 MG tablet Take 1 tablet (70 mg total) by mouth once a week. Take with a full glass of water on an empty stomach, remain upright (Patient taking differently: Take 70 mg by mouth once a week. Saturday) 12 tablet 3  . ALPRAZolam (XANAX) 0.5 MG tablet Take 1 tablet (0.5 mg total) by mouth 2 (two) times daily as needed for anxiety. 60 tablet 5  . amLODipine (NORVASC) 2.5 MG tablet Take 1 tablet (2.5 mg total) by mouth daily. 30 tablet 6  . apixaban (ELIQUIS) 5 MG TABS tablet Take 1 tablet (5 mg total) by mouth 2 (two) times daily. 180 tablet 3  . Cholecalciferol (VITAMIN D-3) 1000 UNITS CAPS Take 2,000 Units by mouth 2 (two) times daily.    . diphenhydrAMINE (BENADRYL) 25 MG tablet Take 50 mg by mouth 2 (two) times daily as needed for allergies (eye allergies).     . docusate sodium (COLACE) 100 MG capsule Take 1 capsule (100 mg total) by mouth 2 (two) times daily. 10 capsule 0  . furosemide (LASIX) 20 MG tablet Take 1 tablet (20 mg total) by mouth daily. 90 tablet 3  . isosorbide mononitrate (IMDUR) 30 MG 24 hr tablet Take 1 tablet (30 mg total) by mouth daily. 30  tablet 6  . loperamide (IMODIUM) 2 MG capsule Take 1 capsule (2 mg total) by mouth as needed for diarrhea or loose stools. 30 capsule 0  . metoprolol (LOPRESSOR) 50 MG tablet Take 1 tablet (50 mg total) by mouth 2 (two) times daily. 180 tablet 3  . nitroGLYCERIN (NITROSTAT) 0.4 MG SL tablet Place 1 tablet (0.4 mg total) under the tongue every 5 (five) minutes as needed for chest pain. 25 tablet 2  . Omega-3 Fatty Acids (FISH  OIL) 1000 MG CAPS Take 1-2 capsules by mouth 2 (two) times daily. Takes one in the morning and two capsules at night.    Marland Kitchen omeprazole (PRILOSEC) 20 MG capsule Take 1 capsule (20 mg total) by mouth daily. (Patient taking differently: Take 20 mg by mouth every morning. ) 90 capsule 3  . polyethylene glycol (MIRALAX / GLYCOLAX) packet Take 17 g by mouth 2 (two) times daily. 14 each 0  . Polyvinyl Alcohol-Povidone (REFRESH OP) Apply 1 drop to eye 3 (three) times daily as needed (dry eyes).    Marland Kitchen rOPINIRole (REQUIP) 0.25 MG tablet Take 1 tablet (0.25 mg total) by mouth 3 (three) times daily. 270 tablet 3  . simvastatin (ZOCOR) 40 MG tablet Take 0.5 tablets (20 mg total) by mouth at bedtime. 45 tablet 3  . tiZANidine (ZANAFLEX) 4 MG tablet Take 1 tablet (4 mg total) by mouth every 8 (eight) hours as needed for muscle spasms. 50 tablet 4  . traMADol (ULTRAM) 50 MG tablet TAKE 1 TABLET BY MOUTH TWICE A DAY 60 tablet 5  . traZODone (DESYREL) 50 MG tablet Take 1 tablet (50 mg total) by mouth at bedtime. 90 tablet 3  . vitamin B-12 (CYANOCOBALAMIN) 100 MCG tablet Take 2,000 mcg by mouth 2 (two) times daily.      Objective: BP 132/74 mmHg  Pulse 56  Temp(Src) 97.5 F (36.4 C) (Oral)  Resp 18  SpO2 94% Exam: General: alert, well-apperaing, NAD, HEENT: NCAT,PERRLA, no injection, EOMI. MMM.  Lungs: CTAB, normal respiratory effort, no crackles, and no wheezes.  Heart: RRR, no M/R/G.  Abdomen: Bowel sounds normal; abdomen soft and nontender. No masses. Pulses: DP/PT are full and equal  bilaterally.  Extremities: No edema Neurologic: No focal deficits, +5 strength globally, sensation grossly intact, A&Ox3. Skin: Intact without suspicious lesions or rashes. Warm and dry. Genitalia: DRE without blood. No hemorrhoids appreciated on rectal exam. Psych: Mood and affect are normal.  Labs and Imaging: CBC BMET   Recent Labs Lab 02/17/15 1006  WBC 6.9  HGB 10.1*  HCT 31.0*  PLT 171    Recent Labs Lab 02/17/15 1006  NA 140  K 4.2  CL 108  CO2 22  BUN 21*  CREATININE 1.55*  GLUCOSE 100*  CALCIUM 9.3      Taye Gonfa  02/17/2015, 11:18 AM PGY-1, [place] Intern pager: [phone], text pages welcome  FPTS Upper-Level Resident Addendum  I have independently interviewed and examined the patient. I have discussed the above with the original author and agree with their documentation. My edits for correction/addition/clarification are in pink. Please see also any attending notes.   Katheren Shams, DO PGY-2, [place]

## 2015-02-17 NOTE — ED Notes (Signed)
Pt made aware of bed assignment 

## 2015-02-17 NOTE — Telephone Encounter (Signed)
Family Medicine Emergency Line Telephone Note   [name] called the emergency line stating that she has had bright red bleeding from her rectum since yesterday. She states that she has had a history of bleeding hemorrhoids before and thinks this may be the cause but unsure. She says that it started yesterday morning and then stopped and occurred 3 more times since. She says it has bled so much that it was dripping on the floor.   I informed patient that she needs to go to the hospital to be evaluated. She may need banding. Also would recommend checking her hemoglobin.   Patient voiced understanding and agreed to plan.   Luiz Blare, DO 02/17/2015, 8:29 AM PGY-2, [place]

## 2015-02-17 NOTE — Progress Notes (Signed)
NURSING PROGRESS NOTE  [facility] [id number] Admission Data: 02/17/2015 2:01 PM Attending Provider: Kinnie Feil, MD [id number] Arnell Sieving, MD Code Status: FULL   Jacqueline [name] is a 81 y.o. female patient admitted from ED:  -No acute distress noted.  -No complaints of shortness of breath.  -No complaints of chest pain.     Blood pressure 151/75, pulse 58, temperature 97.7 F (36.5 C), temperature source Oral, resp. rate 20, SpO2 96 %.   IV Fluids:  IV in place, occlusive dsg intact without redness, IV cath forearm right, condition patent and no redness normal saline.   Allergies:  Atorvastatin; Crestor; and Morphine and related  Past Medical History:   has a past medical history of Hypertension; CAD (coronary artery disease); Hyperlipemia; Insomnia; Diverticulitis (02/21/2012); GERD (gastroesophageal reflux disease); Atrial fibrillation with rapid ventricular response (02/2014); and Arthritis.  Past Surgical History:   has past surgical history that includes Coronary artery bypass graft (2006); TEE without cardioversion (N/A, 02/28/2014); Cardioversion (N/A, 02/28/2014); Cataract extraction (Bilateral, 2014); Abdominal hysterectomy (1975); Rotator cuff repair (Bilateral); Joint replacement (Bilateral); Total knee arthroplasty (Right, 07/02/2014); left heart catheterization with coronary/graft angiogram (N/A, 09/17/2014); and Reimplantation of total knee (Right, 10/08/2014).  Social History:   reports that she has never smoked. She has never used smokeless tobacco. She reports that she does not drink alcohol or use illicit drugs.  Skin: no skin issues  Patient/Family orientated to room. Information packet given to patient/family. Admission inpatient armband information verified with patient/family to include name and date of birth and placed on patient arm. Side rails up x 2, fall assessment and education completed with patient/family. Patient/family able to verbalize understanding of  risk associated with falls and verbalized understanding to call for assistance before getting out of bed. Call light within reach. Patient/family able to voice and demonstrate understanding of unit orientation instructions.    Will continue to evaluate and treat per MD orders.     Lutricia Feil RN

## 2015-02-17 NOTE — ED Notes (Signed)
Family Medicine at bedside 

## 2015-02-17 NOTE — ED Provider Notes (Signed)
CSN: [id number]     Arrival date & time 02/17/15  [id number] History   First MD Initiated Contact with Patient 02/17/15 [phone]     Chief Complaint  Patient presents with  . Rectal Bleeding  . Shortness of Breath     (Consider location/radiation/quality/duration/timing/severity/associated sxs/prior Treatment) HPI Patient developed rectal bleeding yesterday. Associated symptoms include shortness of breath which started today meaning that she feels like "I can't get a good deep breath" . Other associated symptoms include generalized weakness for the past 2 weeks. She denies pain anywhere. Nothing makes symptoms better or worse. No other associated symptoms. No treatment prior to coming here. Past Medical History  Diagnosis Date  . Hypertension   . CAD (coronary artery disease)     a. s/p CABG x 2 (LIMA->LAD, VG->Diag);  b. 08/2014 Cath: LM nl, LAD 90p, LCX nl, RCA nl/dominant, LIMA->LAD atretic, VG->D2 patent w/ retrograde filling of LAD, EF 55-60%-->Med Rx.  Marland Kitchen Hyperlipemia   . Insomnia   . Diverticulitis 02/21/2012  . GERD (gastroesophageal reflux disease)   . Atrial fibrillation with rapid ventricular response 02/2014    a. CHA2DS2VASc = 6 -> on eliquis;  b. 02/2014 s/p DCCV;  c. 08/2014 Echo: EF 55-60%, Gr 2 DD, mild MR, triv AI.  Marland Kitchen Arthritis     back   Past Surgical History  Procedure Laterality Date  . Coronary artery bypass graft  2006    off-pump bypass surgery with LIMA to the LAD and SVG to the second diagonal artery ( performed by Dr Servando Snare)   . Tee without cardioversion N/A 02/28/2014    Procedure: TRANSESOPHAGEAL ECHOCARDIOGRAM (TEE);  Surgeon: Sanda Klein, MD;  Location: [facility];  Service: Cardiovascular;  Laterality: N/A;  . Cardioversion N/A 02/28/2014    Procedure: CARDIOVERSION;  Surgeon: Sanda Klein, MD;  Location: [facility];  Service: Cardiovascular;  Laterality: N/A;  . Cataract extraction Bilateral 2014    with lens implanted  . Abdominal hysterectomy  1975  .  Rotator cuff repair Bilateral     r-1999, l- 2005  . Joint replacement Bilateral     L-2004, R-2006  . Total knee arthroplasty Right 07/02/2014    Procedure: Resection of Infected Right Total Knee Arthroplasty with placement antibiotic spacer;  Surgeon: Mauri Pole, MD;  Location: [facility];  Service: Orthopedics;  Laterality: Right;  . Left heart catheterization with coronary/graft angiogram N/A 09/17/2014    Procedure: LEFT HEART CATHETERIZATION WITH Beatrix Fetters;  Surgeon: Troy Sine, MD;  Location: [facility] CATH LAB;  Service: Cardiovascular;  Laterality: N/A;  . Reimplantation of total knee Right 10/08/2014    Procedure: REIMPLANTATION OF RIGHT TOTAL KNEE ARTHROPLASTY WITH REMOVAL OF ANTIBIOTIC SPACER;  Surgeon: Paralee Cancel, MD;  Location: [facility];  Service: Orthopedics;  Laterality: Right;   Family History  Problem Relation Age of Onset  . Parkinson's disease Mother    History  Substance Use Topics  . Smoking status: Never Smoker   . Smokeless tobacco: Never Used  . Alcohol Use: No   OB History    No data available     Review of Systems  HENT: Negative.   Respiratory: Positive for shortness of breath.   Cardiovascular: Negative.   Gastrointestinal: Positive for blood in stool.  Musculoskeletal: Negative.   Skin: Negative.   Neurological: Positive for weakness.  Psychiatric/Behavioral: Negative.   All other systems reviewed and are negative.     Allergies  Atorvastatin; Crestor; and Morphine and related  Home Medications   Prior  to Admission medications   Medication Sig Start Date End Date Taking? Authorizing Provider  acetaminophen (TYLENOL) 500 MG tablet Take 500-1,000 mg by mouth every 6 (six) hours as needed for moderate pain or headache.    Historical Provider, MD  alendronate (FOSAMAX) 70 MG tablet Take 1 tablet (70 mg total) by mouth once a week. Take with a full glass of water on an empty stomach, remain upright Patient taking differently: Take 70  mg by mouth once a week. Saturday 03/22/14   [name], MD  ALPRAZolam Duanne Moron) 0.5 MG tablet Take 1 tablet (0.5 mg total) by mouth 2 (two) times daily as needed for anxiety. 11/19/14   [name], MD  amLODipine (NORVASC) 2.5 MG tablet Take 1 tablet (2.5 mg total) by mouth daily. 09/18/14   Rogelia Mire, NP  apixaban (ELIQUIS) 5 MG TABS tablet Take 1 tablet (5 mg total) by mouth 2 (two) times daily. 02/28/14   Evelene Croon Barrett, PA-C  Cholecalciferol (VITAMIN D-3) 1000 UNITS CAPS Take 2,000 Units by mouth 2 (two) times daily.    Historical Provider, MD  diphenhydrAMINE (BENADRYL) 25 MG tablet Take 50 mg by mouth 2 (two) times daily as needed for allergies (eye allergies).     Historical Provider, MD  docusate sodium (COLACE) 100 MG capsule Take 1 capsule (100 mg total) by mouth 2 (two) times daily. 10/11/14   Danae Orleans, PA-C  furosemide (LASIX) 20 MG tablet Take 1 tablet (20 mg total) by mouth daily. 01/30/15   Mihai Croitoru, MD  isosorbide mononitrate (IMDUR) 30 MG 24 hr tablet Take 1 tablet (30 mg total) by mouth daily. 09/18/14   Rogelia Mire, NP  loperamide (IMODIUM) 2 MG capsule Take 1 capsule (2 mg total) by mouth as needed for diarrhea or loose stools. 07/05/14   Danae Orleans, PA-C  metoprolol (LOPRESSOR) 50 MG tablet Take 1 tablet (50 mg total) by mouth 2 (two) times daily. 11/14/14   [name], MD  nitroGLYCERIN (NITROSTAT) 0.4 MG SL tablet Place 1 tablet (0.4 mg total) under the tongue every 5 (five) minutes as needed for chest pain. 03/21/14   Mihai Croitoru, MD  Omega-3 Fatty Acids (FISH OIL) 1000 MG CAPS Take 1-2 capsules by mouth 2 (two) times daily. Takes one in the morning and two capsules at night.    Historical Provider, MD  omeprazole (PRILOSEC) 20 MG capsule Take 1 capsule (20 mg total) by mouth daily. Patient taking differently: Take 20 mg by mouth every morning.  05/21/14   [name], MD  polyethylene glycol (MIRALAX / GLYCOLAX) packet Take 17  g by mouth 2 (two) times daily. 10/11/14   Danae Orleans, PA-C  Polyvinyl Alcohol-Povidone (REFRESH OP) Apply 1 drop to eye 3 (three) times daily as needed (dry eyes).    Historical Provider, MD  rOPINIRole (REQUIP) 0.25 MG tablet Take 1 tablet (0.25 mg total) by mouth 3 (three) times daily. 01/03/15   [name], MD  simvastatin (ZOCOR) 40 MG tablet Take 0.5 tablets (20 mg total) by mouth at bedtime. 01/03/15   [name], MD  tiZANidine (ZANAFLEX) 4 MG tablet Take 1 tablet (4 mg total) by mouth every 8 (eight) hours as needed for muscle spasms. 10/11/14   Danae Orleans, PA-C  traMADol (ULTRAM) 50 MG tablet TAKE 1 TABLET BY MOUTH TWICE A DAY 01/03/15   [name], MD  traZODone (DESYREL) 50 MG tablet Take 1 tablet (50 mg total) by mouth at  bedtime. 01/03/15   [name], MD  vitamin B-12 (CYANOCOBALAMIN) 100 MCG tablet Take 2,000 mcg by mouth 2 (two) times daily.    Historical Provider, MD   There were no vitals taken for this visit. Physical Exam  Constitutional: She appears well-developed and well-nourished. No distress.  HENT:  Head: Normocephalic and atraumatic.  Eyes: Conjunctivae are normal. Pupils are equal, round, and reactive to light.  Neck: Neck supple. No tracheal deviation present. No thyromegaly present.  Cardiovascular: Normal rate and regular rhythm.   No murmur heard. Pulmonary/Chest: Effort normal and breath sounds normal.  Abdominal: Soft. Bowel sounds are normal. She exhibits no distension. There is no tenderness.  Genitourinary: Guaiac positive stool.  Tiny amount of stool on examining finger  Musculoskeletal: Normal range of motion. She exhibits no edema or tenderness.  Neurological: She is alert. Coordination normal.  Skin: Skin is warm and dry. No rash noted.  Psychiatric: She has a normal mood and affect.  Nursing note and vitals reviewed.   ED Course  Procedures (including critical care time) Labs Review Labs Reviewed - No data to  display  Imaging Review No results found.   EKG Interpretation   Date/Time:  Sunday February 17 2015 09:39:45 EDT Ventricular Rate:  58 PR Interval:  180 QRS Duration: 84 QT Interval:  436 QTC Calculation: 428 R Axis:   49 Text Interpretation:  Sinus bradycardia Cannot rule out Anterior infarct ,  age undetermined Abnormal ECG No significant change since last tracing  Confirmed by   MD,  ([id number]) on 02/17/2015 9:46:11 AM     11  AM patient resting comfortably. No distress Results for orders placed or performed during the hospital encounter of [id number]  Basic metabolic panel  Result Value Ref Range   Sodium 140 135 - 145 mmol/L   Potassium 4.2 3.5 - 5.1 mmol/L   Chloride 108 101 - 111 mmol/L   CO2 22 22 - 32 mmol/L   Glucose, Bld 100 (H) 65 - 99 mg/dL   BUN 21 (H) 6 - 20 mg/dL   Creatinine, Ser 1.55 (H) 0.44 - 1.00 mg/dL   Calcium 9.3 8.9 - 10.3 mg/dL   GFR calc non Af Amer 30 (L) >60 mL/min   GFR calc Af Amer 35 (L) >60 mL/min   Anion gap 10 5 - 15  CBC with Differential/Platelet  Result Value Ref Range   WBC 6.9 4.0 - 10.5 K/uL   RBC 3.33 (L) 3.87 - 5.11 MIL/uL   Hemoglobin 10.1 (L) 12.0 - 15.0 g/dL   HCT 31.0 (L) 36.0 - 46.0 %   MCV 93.1 78.0 - 100.0 fL   MCH 30.3 26.0 - 34.0 pg   MCHC 32.6 30.0 - 36.0 g/dL   RDW 13.3 11.5 - 15.5 %   Platelets 171 150 - 400 K/uL   Neutrophils Relative % 73 43 - 77 %   Neutro Abs 5.0 1.7 - 7.7 K/uL   Lymphocytes Relative 19 12 - 46 %   Lymphs Abs 1.3 0.7 - 4.0 K/uL   Monocytes Relative 6 3 - 12 %   Monocytes Absolute 0.4 0.1 - 1.0 K/uL   Eosinophils Relative 2 0 - 5 %   Eosinophils Absolute 0.2 0.0 - 0.7 K/uL   Basophils Relative 0 0 - 1 %   Basophils Absolute 0.0 0.0 - 0.1 K/uL  POC occult blood, ED Provider will collect  Result Value Ref Range   Fecal Occult Bld POSITIVE (A) NEGATIVE   No results  found.  MDM  Spoke with Dr.Gonfa plan admit medical surgical floor. Patient will likely need gastroenterology  consult. Nothing by mouth, IV fluids. Diagnoses #1 acute GI bleeding #2 symptomatic anemia #3 renal insufficiency  Final diagnoses:  None        Orlie Dakin, MD 02/17/15 [phone]

## 2015-02-18 ENCOUNTER — Encounter (HOSPITAL_COMMUNITY): Payer: Self-pay

## 2015-02-18 DIAGNOSIS — K921 Melena: Secondary | ICD-10-CM | POA: Diagnosis not present

## 2015-02-18 DIAGNOSIS — K922 Gastrointestinal hemorrhage, unspecified: Secondary | ICD-10-CM | POA: Diagnosis not present

## 2015-02-18 DIAGNOSIS — Z7901 Long term (current) use of anticoagulants: Secondary | ICD-10-CM | POA: Diagnosis not present

## 2015-02-18 DIAGNOSIS — N182 Chronic kidney disease, stage 2 (mild): Secondary | ICD-10-CM | POA: Diagnosis not present

## 2015-02-18 DIAGNOSIS — I4891 Unspecified atrial fibrillation: Secondary | ICD-10-CM | POA: Diagnosis not present

## 2015-02-18 DIAGNOSIS — I5033 Acute on chronic diastolic (congestive) heart failure: Secondary | ICD-10-CM | POA: Diagnosis not present

## 2015-02-18 LAB — BASIC METABOLIC PANEL
Anion gap: 9 (ref 5–15)
BUN: 18 mg/dL (ref 6–20)
CO2: 26 mmol/L (ref 22–32)
Calcium: 9.5 mg/dL (ref 8.9–10.3)
Chloride: 102 mmol/L (ref 101–111)
Creatinine, Ser: 1.44 mg/dL — ABNORMAL HIGH (ref 0.44–1.00)
GFR, EST AFRICAN AMERICAN: 38 mL/min — AB (ref >60)
GFR, EST NON AFRICAN AMERICAN: 33 mL/min — AB (ref >60)
Glucose, Bld: 94 mg/dL (ref 65–99)
POTASSIUM: 3.8 mmol/L (ref 3.5–5.1)
Sodium: 137 mmol/L (ref 135–145)

## 2015-02-18 LAB — CBC
HCT: 31.1 % — ABNORMAL LOW (ref 36.0–46.0)
HEMATOCRIT: 33.6 % — AB (ref 36.0–46.0)
HEMOGLOBIN: 11.4 g/dL — AB (ref 12.0–15.0)
Hemoglobin: 10.6 g/dL — ABNORMAL LOW (ref 12.0–15.0)
MCH: 31.4 pg (ref 26.0–34.0)
MCH: 31.7 pg (ref 26.0–34.0)
MCHC: 33.9 g/dL (ref 30.0–36.0)
MCHC: 34.1 g/dL (ref 30.0–36.0)
MCV: 92.6 fL (ref 78.0–100.0)
MCV: 93.1 fL (ref 78.0–100.0)
PLATELETS: 179 10*3/uL (ref 150–400)
Platelets: 168 10*3/uL (ref 150–400)
RBC: 3.63 MIL/uL — AB (ref 3.87–5.11)
RBC: 3.34 MIL/uL — AB (ref 3.87–5.11)
RDW: 13.2 % (ref 11.5–15.5)
RDW: 13.2 % (ref 11.5–15.5)
WBC: 7.8 10*3/uL (ref 4.0–10.5)
WBC: 6.5 10*3/uL (ref 4.0–10.5)

## 2015-02-18 MED ORDER — POLYETHYLENE GLYCOL 3350 17 GM/SCOOP PO POWD
1.0000 | Freq: Once | ORAL | Status: AC
  Administered 2015-02-18: 255 g via ORAL
  Filled 2015-02-18: qty 255

## 2015-02-18 NOTE — Progress Notes (Signed)
Family Medicine Teaching Service Daily Progress Note Intern Pager: [phone]  Patient name: Jacqueline [name] Medical record number: [id number] Date of birth: 11-24-1930 Age: 81 y.o. Gender: female  Primary Care Provider: Zigmund Gottron, MD Consultants: GI Code Status: Full  Pt Overview and Major Events to Date:  7/24- admitted with BRBPR  Assessment and Plan: Jacqueline [name] is a 81 y.o. female presenting with BRBPR . PMH is significant for AFib, dCHF, HTN, HLD, anemia & insomnia  Rectal bleeding: BRBPR x2 days. FOBT positive in ED but no active bleeding noticed on rectal exam. Hgb 10.1, about baseline. She had colonoscopy in 2007 that showed multiple rectosigmoid polyps, diverticulosis in descending and sigmoid colon, and grade 1 hypervascular internal hemorrhoid. Pathology at that time showed hyperplastic polyps without adenomatous mucosa. She is now on eliquis for afib. DDx includes internal hemorrhoids and diverticulosis are likely given no abdominal pain and precious colonoscopy finding. Patient had similar episode of bleeding in remote past from internal hemorrhoid. Other possible causes are colon cancer also no family hx & constitutional sxs. Another episode of BRBPR this AM about 6 am. Continue to HDS. Denies dizziness. SOB and chest tightness improved. Hgb 11.4 this AM. -Admit to tele, attending Dr. Gwendlyn Deutscher -Consulted GI appreciate recs -Two IV lines (18 guages) - Holding eliquis  - f/u CBC this PM - transfuse if Hgb <7 or becomes unstable  Dyspnea: for two weeks but worse over the last two days. SOB is exertional. Endorses some chest tightness. H/o dCHF. Lung exam with some crackles. BNP 400. Trop negx1. CXR showing pulmonary edema and trace effusions. Supposed to be on home PO lasix daily but has not been taking. No sign of infection. Less likely to be PE while on eliquis. There is no tachycardia or tachypnea either. She doesn't appear fluid overloaded. No oxygen requirement at  this time. SOB and chest tightness improved after lasix IV 20 mg.  UOP of 2.5L with a net of -2.4L. - No diuretics given no SOB and no sign of fluid overload. - oxygen as needed to maintain sats  Anemia: Hgb of 10.1 today (about baseline). MCV 93. Likely anemia of chronic diease based on MCV. Patient asymptomatic.  - will monitor H&H - will transfuse as needed  Elevated creatinine/ CKD-II: sCr 1.55. B/l 1-1.3. Down to 1.44 this AM. Unsure of source at this time. Does not appear prerenal or postrenal. Received fluids in ED.  - Recheck BMP in AM - avoid nephrotoxic agents  Afib: on elquis 5 mg daily and metoprolol 50 mg at home - hold eliquis - continue home metoprolol  HTN: Normotensive.  -Continue home meds  dCHF: EF 55-60 with grade 2 DD in 08/2014. On amlodipine 2.5 mg, IMDUR 30 mg, Metoprolol 50 mg bid at home. Denies taking lasix 20 daily even though on medication list. Unsure what baseline wt appears to be ~137.  - hold lasix after IV 1x dose - consider repeating echo - continue home meds  Hyperlipidemia: lipid panel CH 132, TG 148, LDL 82 HDL 33 in 08/2014. On simvastatin 20 mg at home. - will continue statin  Insomnia: on ativan 0.5 mg and trazodone 50 mg at home - will continue home trazodone prn  FEN/GI: SLIV, NPO pending GI, Protonix Prophylaxis: SCDs  Disposition: floor with tele. F/u with GI recs  Subjective:  Reports that she had active bleeding this morning about 6 am. Bleeding wasn't as heavy as when she first bled at home. She reports some dripping on  the floor as well. She reports bleeding has now stopped. She denies dizziness or other symptoms. She reports SOB and chest tightness has resolved. She has been HDS. Hgb 11.4 but that was before bleeding.   Objective: Temp:  [97.7 F (36.5 C)-99 F (37.2 C)] 98.7 F (37.1 C) (07/25 0531) Pulse Rate:  [54-72] 59 (07/25 0531) Resp:  [18-21] 20 (07/25 0531) BP: (132-171)/(70-80) 140/73 mmHg (07/25 0531) SpO2:   [92 %-97 %] 95 % (07/25 0531) Weight:  [126 lb 1.7 oz (57.2 kg)-131 lb 6.4 oz (59.603 kg)] 126 lb 1.7 oz (57.2 kg) (07/25 0531) Physical Exam:  Gen: NAD. Alert, cooperative with exam HEENT: Oropharynx clear. MMM CV: irregularly irregular. No murmurs, rubs, or gallops noted. 2+ radial and DP pulses bilaterally. Resp: CTAB. No wheezing, but some crackles at base of her right lung. Abd: +BS. Soft, non-distended, non-tender. No rebound or guarding.  Ext: No edema. No gross deformities. Neuro: Alert and oriented, No gross focal deficits Laboratory:  Recent Labs Lab 02/17/15 1006 02/18/15 0515  WBC 6.9 7.8  HGB 10.1* 11.4*  HCT 31.0* 33.6*  PLT 171 179    Recent Labs Lab 02/17/15 1006 02/18/15 0515  NA 140 137  K 4.2 3.8  CL 108 102  CO2 22 26  BUN 21* 18  CREATININE 1.55* 1.44*  CALCIUM 9.3 9.5  GLUCOSE 100* 94    Imaging/Diagnostic Tests: X-ray Chest Pa And Lateral  02/17/2015   CLINICAL DATA:  Chest pain and shortness of breath  EXAM: CHEST  2 VIEW  COMPARISON:  09/15/2014  FINDINGS: Heart size is upper limits of normal with evidence of median sternotomy. Diffusely prominent interstitial markings noted with interstitial Kerley B-lines and trace pleural effusions compatible with interstitial edema. No focal pulmonary opacity. Right humeral bone anchors partly visualized.  IMPRESSION: Borderline cardiomegaly with interstitial pulmonary edema and trace effusions.   Electronically Signed   By: Conchita Paris M.D.   On: 02/17/2015 14:13    Mercy Riding, MD 02/18/2015, 9:51 AM PGY-1, [facility] Intern pager: [phone], text pages welcome

## 2015-02-18 NOTE — Progress Notes (Signed)
Daily Rounding Note  02/18/2015, 8:22 AM  LOS: 1 day   SUBJECTIVE:       Smaller but still bloody BM today.  Feels fine.  No chest or abd pain.  Not dizzy, not SOB  OBJECTIVE:         Vital signs in last 24 hours:    Temp:  [97.5 F (36.4 C)-99 F (37.2 C)] 98.7 F (37.1 C) (07/25 0531) Pulse Rate:  [54-72] 59 (07/25 0531) Resp:  [18-21] 20 (07/25 0531) BP: (132-171)/(50-80) 140/73 mmHg (07/25 0531) SpO2:  [92 %-99 %] 95 % (07/25 0531) Weight:  [126 lb 1.7 oz (57.2 kg)-131 lb 6.4 oz (59.603 kg)] 126 lb 1.7 oz (57.2 kg) (07/25 0531) Last BM Date: 02/17/15 Filed Weights   02/17/15 1245 02/18/15 0531  Weight: 131 lb 6.4 oz (59.603 kg) 126 lb 1.7 oz (57.2 kg)   General: pleasant, looks well.  comfortable   Heart: RRR Chest: clear bil.  No dyspnea Abdomen: soft, NT, active BS  Extremities: no CCE Neuro/Psych:  Pleasant, alert, oriented x 3.  Relaxed and fully engaged   Intake/Output from previous day: 07/24 0701 - 07/25 0700 In: -  Out: 2550 [Urine:2550]  Intake/Output this shift:    Lab Results:  Recent Labs  02/17/15 1006 02/18/15 0515  WBC 6.9 7.8  HGB 10.1* 11.4*  HCT 31.0* 33.6*  PLT 171 179   BMET  Recent Labs  02/17/15 1006 02/18/15 0515  NA 140 137  K 4.2 3.8  CL 108 102  CO2 22 26  GLUCOSE 100* 94  BUN 21* 18  CREATININE 1.55* 1.44*  CALCIUM 9.3 9.5   LFT No results for input(s): PROT, ALBUMIN, AST, ALT, ALKPHOS, BILITOT, BILIDIR, IBILI in the last 72 hours. PT/INR No results for input(s): LABPROT, INR in the last 72 hours. Hepatitis Panel No results for input(s): HEPBSAG, HCVAB, HEPAIGM, HEPBIGM in the last 72 hours.  Studies/Results: X-ray Chest Pa And Lateral  02/17/2015   CLINICAL DATA:  Chest pain and shortness of breath  EXAM: CHEST  2 VIEW  COMPARISON:  09/15/2014  FINDINGS: Heart size is upper limits of normal with evidence of median sternotomy. Diffusely prominent  interstitial markings noted with interstitial Kerley B-lines and trace pleural effusions compatible with interstitial edema. No focal pulmonary opacity. Right humeral bone anchors partly visualized.  IMPRESSION: Borderline cardiomegaly with interstitial pulmonary edema and trace effusions.   Electronically Signed   By: Conchita Paris M.D.   On: 02/17/2015 14:13    ASSESMENT:   *  Painless hematochezia.  Internal hemorrhoids (though remote hemorrhoidectomy ~ 1980s), diverticulosis on 2007 colonoscopy.  Baseline is periodic scant bleeding PR.  Pt on Eliquis for a fib. Suspect diverticular bleed.   Hgb actually improved in last 20 hours.   *  AKI, improved.    PLAN   *  Holding Eliquis and observing for cessation vs continuation of bleeding. Will need to stay another night.  *  If no plan for colonoscopy, would advance to solid diet.   *  Consider reassessing risk/benefit ratio to continuing Eliquis.     Azucena Freed  02/18/2015, 8:22 AM Pager: [phone]     Attending physician's note   I have taken an interval history, reviewed the chart and examined the patient. I agree with the Advanced Practitioner's note, impression and recommendations. Hematochezia has slowed. Hb=11.4 today. Continue to hold Eliquis. Colonoscopy tomorrow. Pt in agreement with plans for colonoscopy.  Pricilla Riffle. Fuller Plan, MD [facility]

## 2015-02-19 ENCOUNTER — Observation Stay (HOSPITAL_COMMUNITY): Payer: PPO | Admitting: Certified Registered"

## 2015-02-19 ENCOUNTER — Encounter (HOSPITAL_COMMUNITY): Admission: EM | Disposition: A | Discharge status: Home / Self Care | Payer: Self-pay | Source: Home / Self Care

## 2015-02-19 ENCOUNTER — Encounter (HOSPITAL_COMMUNITY): Payer: Self-pay | Admitting: Certified Registered"

## 2015-02-19 DIAGNOSIS — K5731 Diverticulosis of large intestine without perforation or abscess with bleeding: Secondary | ICD-10-CM

## 2015-02-19 DIAGNOSIS — K921 Melena: Secondary | ICD-10-CM | POA: Diagnosis not present

## 2015-02-19 DIAGNOSIS — I5033 Acute on chronic diastolic (congestive) heart failure: Secondary | ICD-10-CM | POA: Diagnosis not present

## 2015-02-19 DIAGNOSIS — I5032 Chronic diastolic (congestive) heart failure: Secondary | ICD-10-CM | POA: Diagnosis not present

## 2015-02-19 DIAGNOSIS — N189 Chronic kidney disease, unspecified: Secondary | ICD-10-CM | POA: Diagnosis not present

## 2015-02-19 DIAGNOSIS — I129 Hypertensive chronic kidney disease with stage 1 through stage 4 chronic kidney disease, or unspecified chronic kidney disease: Secondary | ICD-10-CM | POA: Diagnosis not present

## 2015-02-19 DIAGNOSIS — N182 Chronic kidney disease, stage 2 (mild): Secondary | ICD-10-CM | POA: Diagnosis not present

## 2015-02-19 DIAGNOSIS — I4891 Unspecified atrial fibrillation: Secondary | ICD-10-CM | POA: Diagnosis not present

## 2015-02-19 DIAGNOSIS — K922 Gastrointestinal hemorrhage, unspecified: Secondary | ICD-10-CM | POA: Diagnosis not present

## 2015-02-19 HISTORY — PX: COLONOSCOPY: SHX5424

## 2015-02-19 LAB — CBC
HCT: 34.5 % — ABNORMAL LOW (ref 36.0–46.0)
Hemoglobin: 11.6 g/dL — ABNORMAL LOW (ref 12.0–15.0)
MCH: 31.4 pg (ref 26.0–34.0)
MCHC: 33.6 g/dL (ref 30.0–36.0)
MCV: 93.2 fL (ref 78.0–100.0)
Platelets: 187 10*3/uL (ref 150–400)
RBC: 3.7 MIL/uL — AB (ref 3.87–5.11)
RDW: 13.3 % (ref 11.5–15.5)
WBC: 5.4 10*3/uL (ref 4.0–10.5)

## 2015-02-19 LAB — HEMOGLOBIN A1C
HEMOGLOBIN A1C: 5.6 % (ref 4.8–5.6)
Mean Plasma Glucose: 114 mg/dL

## 2015-02-19 SURGERY — COLONOSCOPY
Anesthesia: Monitor Anesthesia Care

## 2015-02-19 MED ORDER — LIDOCAINE HCL (CARDIAC) 20 MG/ML IV SOLN
INTRAVENOUS | Status: DC | PRN
  Administered 2015-02-19: 60 mg via INTRAVENOUS

## 2015-02-19 MED ORDER — HYDROCORTISONE ACETATE 25 MG RE SUPP: 25.0000 mg | Freq: Two times a day (BID) | RECTAL | Status: DC

## 2015-02-19 MED ORDER — HYDROCORTISONE ACETATE 25 MG RE SUPP
25.0000 mg | Freq: Every morning | RECTAL | Status: DC
  Administered 2015-02-19: 25 mg via RECTAL
  Filled 2015-02-19: qty 1

## 2015-02-19 MED ORDER — SODIUM CHLORIDE 0.9 % IV SOLN
INTRAVENOUS | Status: DC
  Administered 2015-02-19: 07:00:00 via INTRAVENOUS

## 2015-02-19 MED ORDER — PROPOFOL INFUSION 10 MG/ML OPTIME
INTRAVENOUS | Status: DC | PRN
  Administered 2015-02-19: 100 ug/kg/min via INTRAVENOUS

## 2015-02-19 MED ORDER — LACTATED RINGERS IV SOLN
Freq: Once | INTRAVENOUS | Status: AC
  Administered 2015-02-19: 10:00:00 via INTRAVENOUS

## 2015-02-19 NOTE — Discharge Instructions (Signed)
It is nice taking care of you! You were admitted with rectal bleeding, shortness of breath and chest pain. For your rectal bleeding, the gaterontrologist has done colonoscopy and discovered that you have diverticulosis (outpouching of your colon wall) and internal hemorrhoids. They recommended high fiber diet and a suppository. The name of the suppository is listed on your medication list. We have stopped your Eliquis (a blood thinner) out of concern for bleeding. Your primary care may decide to restart you on this when your see in a week.   We think your shortness of breath and chest pain are related to having excess fluid in your body. This makes your heart work harder. It also causes some fluid to leak out of blood vessels to areas where it is not supposed to be, such as lung. This causes difficulty breathing as in your case. We gave you medication to get rid of the excess fluid. Your shortness of breath and chest pain resolved. We are sending you home with a medication that you should continue taking. Please, read the direction on the list on discharge paper under medication section.   We have scheduled an appointment with your primary physician. You can find the address and time on the discharge paper under follow up section.  Diverticulosis Diverticulosis is the condition that develops when small pouches (diverticula) form in the wall of your colon. Your colon, or large intestine, is where water is absorbed and stool is formed. The pouches form when the inside layer of your colon pushes through weak spots in the outer layers of your colon. CAUSES  No one knows exactly what causes diverticulosis. RISK FACTORS  Being older than 50. Your risk for this condition increases with age. Diverticulosis is rare in people younger than 40 years. By age 17, almost everyone has it.  Eating a low-fiber diet.  Being frequently constipated.  Being overweight.  Not getting enough  exercise.  Smoking.  Taking over-the-counter pain medicines, like aspirin and ibuprofen. SYMPTOMS  Most people with diverticulosis do not have symptoms. DIAGNOSIS  Because diverticulosis often has no symptoms, health care providers often discover the condition during an exam for other colon problems. In many cases, a health care provider will diagnose diverticulosis while using a flexible scope to examine the colon (colonoscopy). TREATMENT  If you have never developed an infection related to diverticulosis, you may not need treatment. If you have had an infection before, treatment may include:  Eating more fruits, vegetables, and grains.  Taking a fiber supplement.  Taking a live bacteria supplement (probiotic).  Taking medicine to relax your colon. HOME CARE INSTRUCTIONS   Drink at least 6-8 glasses of water each day to prevent constipation.  Try not to strain when you have a bowel movement.  Keep all follow-up appointments. If you have had an infection before:  Increase the fiber in your diet as directed by your health care provider or dietitian.  Take a dietary fiber supplement if your health care provider approves.  Only take medicines as directed by your health care provider. SEEK MEDICAL CARE IF:   You have abdominal pain.  You have bloating.  You have cramps.  You have not gone to the bathroom in 3 days. SEEK IMMEDIATE MEDICAL CARE IF:   Your pain gets worse.  Yourbloating becomes very bad.  You have a fever or chills, and your symptoms suddenly get worse.  You begin vomiting.  You have bowel movements that are bloody or black. MAKE SURE YOU:  Understand these instructions.  Will watch your condition.  Will get help right away if you are not doing well or get worse. Document Released: 04/09/2004 Document Revised: 07/18/2013 Document Reviewed: 06/07/2013 [facility] Patient Information 2015 [place], [place]. This information is not intended to replace  advice given to you by your health care provider. Make sure you discuss any questions you have with your health care provider.    Hemorrhoids Hemorrhoids are swollen veins around the rectum or anus. There are two types of hemorrhoids:   Internal hemorrhoids. These occur in the veins just inside the rectum. They may poke through to the outside and become irritated and painful.  External hemorrhoids. These occur in the veins outside the anus and can be felt as a painful swelling or hard lump near the anus. CAUSES  Pregnancy.   Obesity.   Constipation or diarrhea.   Straining to have a bowel movement.   Sitting for long periods on the toilet.  Heavy lifting or other activity that caused you to strain.  Anal intercourse. SYMPTOMS   Pain.   Anal itching or irritation.   Rectal bleeding.   Fecal leakage.   Anal swelling.   One or more lumps around the anus.  DIAGNOSIS  Your caregiver may be able to diagnose hemorrhoids by visual examination. Other examinations or tests that may be performed include:   Examination of the rectal area with a gloved hand (digital rectal exam).   Examination of anal canal using a small tube (scope).   A blood test if you have lost a significant amount of blood.  A test to look inside the colon (sigmoidoscopy or colonoscopy). TREATMENT Most hemorrhoids can be treated at home. However, if symptoms do not seem to be getting better or if you have a lot of rectal bleeding, your caregiver may perform a procedure to help make the hemorrhoids get smaller or remove them completely. Possible treatments include:   Placing a rubber band at the base of the hemorrhoid to cut off the circulation (rubber band ligation).   Injecting a chemical to shrink the hemorrhoid (sclerotherapy).   Using a tool to burn the hemorrhoid (infrared light therapy).   Surgically removing the hemorrhoid (hemorrhoidectomy).   Stapling the hemorrhoid to block  blood flow to the tissue (hemorrhoid stapling).  HOME CARE INSTRUCTIONS   Eat foods with fiber, such as whole grains, beans, nuts, fruits, and vegetables. Ask your doctor about taking products with added fiber in them (fibersupplements).  Increase fluid intake. Drink enough water and fluids to keep your urine clear or pale yellow.   Exercise regularly.   Go to the bathroom when you have the urge to have a bowel movement. Do not wait.   Avoid straining to have bowel movements.   Keep the anal area dry and clean. Use wet toilet paper or moist towelettes after a bowel movement.   Medicated creams and suppositories may be used or applied as directed.   Only take over-the-counter or prescription medicines as directed by your caregiver.   Take warm sitz baths for 15-20 minutes, 3-4 times a day to ease pain and discomfort.   Place ice packs on the hemorrhoids if they are tender and swollen. Using ice packs between sitz baths may be helpful.   Put ice in a plastic bag.   Place a towel between your skin and the bag.   Leave the ice on for 15-20 minutes, 3-4 times a day.   Do not use a donut-shaped pillow  or sit on the toilet for long periods. This increases blood pooling and pain.  SEEK MEDICAL CARE IF:  You have increasing pain and swelling that is not controlled by treatment or medicine.  You have uncontrolled bleeding.  You have difficulty or you are unable to have a bowel movement.  You have pain or inflammation outside the area of the hemorrhoids. MAKE SURE YOU:  Understand these instructions.  Will watch your condition.  Will get help right away if you are not doing well or get worse. Document Released: 07/10/2000 Document Revised: 06/29/2012 Document Reviewed: 05/17/2012 [facility] Patient Information 2015 [place], [place]. This information is not intended to replace advice given to you by your health care provider. Make sure you discuss any questions you have  with your health care provider.

## 2015-02-19 NOTE — Anesthesia Postprocedure Evaluation (Signed)
  Anesthesia Post-op Note  Patient: Jacqueline [name]  Procedure(s) Performed: Procedure(s): COLONOSCOPY (N/A)  Patient Location: Endoscopy Unit  Anesthesia Type:MAC  Level of Consciousness: awake  Airway and Oxygen Therapy: Patient Spontanous Breathing  Post-op Pain: none  Post-op Assessment: Post-op Vital signs reviewed, Patient's Cardiovascular Status Stable, Respiratory Function Stable, Patent Airway, No signs of Nausea or vomiting and Pain level controlled              Post-op Vital Signs: Reviewed and stable  Last Vitals:  Filed Vitals:   02/19/15 1116  BP: 173/79  Pulse: 61  Temp: 36.6 C  Resp: 16    Complications: No apparent anesthesia complications

## 2015-02-19 NOTE — Interval H&P Note (Signed)
History and Physical Interval Note:  02/19/2015 9:58 AM  Jacqueline [name]  has presented today for surgery, with the diagnosis of hematochezia  The various methods of treatment have been discussed with the patient and family. After consideration of risks, benefits and other options for treatment, the patient has consented to  Procedure(s): COLONOSCOPY (N/A) as a surgical intervention .  The patient's history has been reviewed, patient examined, no change in status, stable for surgery.  I have reviewed the patient's chart and labs.  Questions were answered to the patient's satisfaction.     Pricilla Riffle. Fuller Plan

## 2015-02-19 NOTE — H&P (View-Only) (Signed)
Daily Rounding Note  02/18/2015, 8:22 AM  LOS: 1 day   SUBJECTIVE:       Smaller but still bloody BM today.  Feels fine.  No chest or abd pain.  Not dizzy, not SOB  OBJECTIVE:         Vital signs in last 24 hours:    Temp:  [97.5 F (36.4 C)-99 F (37.2 C)] 98.7 F (37.1 C) (07/25 0531) Pulse Rate:  [54-72] 59 (07/25 0531) Resp:  [18-21] 20 (07/25 0531) BP: (132-171)/(50-80) 140/73 mmHg (07/25 0531) SpO2:  [92 %-99 %] 95 % (07/25 0531) Weight:  [126 lb 1.7 oz (57.2 kg)-131 lb 6.4 oz (59.603 kg)] 126 lb 1.7 oz (57.2 kg) (07/25 0531) Last BM Date: 02/17/15 Filed Weights   02/17/15 1245 02/18/15 0531  Weight: 131 lb 6.4 oz (59.603 kg) 126 lb 1.7 oz (57.2 kg)   General: pleasant, looks well.  comfortable   Heart: RRR Chest: clear bil.  No dyspnea Abdomen: soft, NT, active BS  Extremities: no CCE Neuro/Psych:  Pleasant, alert, oriented x 3.  Relaxed and fully engaged   Intake/Output from previous day: 07/24 0701 - 07/25 0700 In: -  Out: 2550 [Urine:2550]  Intake/Output this shift:    Lab Results:  Recent Labs  02/17/15 1006 02/18/15 0515  WBC 6.9 7.8  HGB 10.1* 11.4*  HCT 31.0* 33.6*  PLT 171 179   BMET  Recent Labs  02/17/15 1006 02/18/15 0515  NA 140 137  K 4.2 3.8  CL 108 102  CO2 22 26  GLUCOSE 100* 94  BUN 21* 18  CREATININE 1.55* 1.44*  CALCIUM 9.3 9.5   LFT No results for input(s): PROT, ALBUMIN, AST, ALT, ALKPHOS, BILITOT, BILIDIR, IBILI in the last 72 hours. PT/INR No results for input(s): LABPROT, INR in the last 72 hours. Hepatitis Panel No results for input(s): HEPBSAG, HCVAB, HEPAIGM, HEPBIGM in the last 72 hours.  Studies/Results: X-ray Chest Pa And Lateral  02/17/2015   CLINICAL DATA:  Chest pain and shortness of breath  EXAM: CHEST  2 VIEW  COMPARISON:  09/15/2014  FINDINGS: Heart size is upper limits of normal with evidence of median sternotomy. Diffusely prominent  interstitial markings noted with interstitial Kerley B-lines and trace pleural effusions compatible with interstitial edema. No focal pulmonary opacity. Right humeral bone anchors partly visualized.  IMPRESSION: Borderline cardiomegaly with interstitial pulmonary edema and trace effusions.   Electronically Signed   By: Conchita Paris M.D.   On: 02/17/2015 14:13    ASSESMENT:   *  Painless hematochezia.  Internal hemorrhoids (though remote hemorrhoidectomy ~ 1980s), diverticulosis on 2007 colonoscopy.  Baseline is periodic scant bleeding PR.  Pt on Eliquis for a fib. Suspect diverticular bleed.   Hgb actually improved in last 20 hours.   *  AKI, improved.    PLAN   *  Holding Eliquis and observing for cessation vs continuation of bleeding. Will need to stay another night.  *  If no plan for colonoscopy, would advance to solid diet.   *  Consider reassessing risk/benefit ratio to continuing Eliquis.     Azucena Freed  02/18/2015, 8:22 AM Pager: [phone]     Attending physician's note   I have taken an interval history, reviewed the chart and examined the patient. I agree with the Advanced Practitioner's note, impression and recommendations. Hematochezia has slowed. Hb=11.4 today. Continue to hold Eliquis. Colonoscopy tomorrow. Pt in agreement with plans for colonoscopy.  Pricilla Riffle. Fuller Plan, MD [facility]

## 2015-02-19 NOTE — Anesthesia Preprocedure Evaluation (Addendum)
Anesthesia Evaluation  Patient identified by MRN, date of birth, ID band Patient awake    Reviewed: Allergy & Precautions, NPO status , Patient's Chart, lab work & pertinent test results  History of Anesthesia Complications Negative for: history of anesthetic complications  Airway Mallampati: III  TM Distance: >3 FB Neck ROM: Full    Dental no notable dental hx.    Pulmonary shortness of breath,    + decreased breath sounds      Cardiovascular hypertension, Pt. on medications and Pt. on home beta blockers + angina + CAD Rhythm:Regular     Neuro/Psych negative neurological ROS  negative psych ROS   GI/Hepatic Neg liver ROS, GERD-  Medicated and Controlled,  Endo/Other  negative endocrine ROS  Renal/GU Renal InsufficiencyRenal disease     Musculoskeletal  (+) Arthritis -,   Abdominal   Peds  Hematology  (+) Blood dyscrasia, anemia ,   Anesthesia Other Findings   Reproductive/Obstetrics                            Anesthesia Physical Anesthesia Plan  ASA: III  Anesthesia Plan: MAC   Post-op Pain Management:    Induction: Intravenous  Airway Management Planned: Nasal Cannula  Additional Equipment: None  Intra-op Plan:   Post-operative Plan:   Informed Consent: I have reviewed the patients History and Physical, chart, labs and discussed the procedure including the risks, benefits and alternatives for the proposed anesthesia with the patient or authorized representative who has indicated his/her understanding and acceptance.   Dental advisory given  Plan Discussed with: CRNA and Surgeon  Anesthesia Plan Comments:         Anesthesia Quick Evaluation

## 2015-02-19 NOTE — Progress Notes (Signed)
CCMD called with alerts about patient's heart rhythm, showing Junctional rhythms with several PJCs. This RN assessed patient, patient asymptomatic, resting comfortably, denies chest pain or chest discomfort. Patient's heart rhythm came back to NSR at the moment.

## 2015-02-19 NOTE — Op Note (Signed)
[facility] [place], [id number]   COLONOSCOPY PROCEDURE REPORT  PATIENT: Jacqueline [name], Jacqueline [name]  MR#: [id number] BIRTHDATE: 03-27-1931 , 84  yrs. old GENDER: female ENDOSCOPIST: Ladene Artist, MD, [facility] REFERRED [id number] Andria Frames, M.D. PROCEDURE DATE:  02/19/2015 PROCEDURE:   Colonoscopy, diagnostic First Screening Colonoscopy - Avg.  risk and is 50 yrs.  old or older - No.  Prior Negative Screening - Now for repeat screening. N/A  History of Adenoma - Now for follow-up colonoscopy & has been > or = to 3 yrs.  N/A  Polyps removed today? No Recommend repeat exam, <10 yrs? No ASA CLASS:   Class III INDICATIONS:Evaluation of unexplained GI bleeding, Colorectal Neoplasm Risk Assessment for this procedure is average risk, and hematochezia. MEDICATIONS: Monitored anesthesia care, Propofol 120 mg IV, and Per Anesthesia DESCRIPTION OF PROCEDURE:   After the risks benefits and alternatives of the procedure were thoroughly explained, informed consent was obtained.  The digital rectal exam revealed no abnormalities of the rectum.   The Pentax Ped Colon [id number] endoscope was introduced through the anus and advanced to the cecum, which was identified by both the appendix and ileocecal valve. No adverse events experienced.   The quality of the prep was (MiraLax was used) good.  The instrument was then slowly withdrawn as the colon was fully examined. Estimated blood loss is zero unless otherwise noted in this procedure report.    COLON FINDINGS: There was moderate diverticulosis noted in the sigmoid colon.  The examination was otherwise normal.  Retroflexed views revealed internal Grade I hemorrhoids. No blood in colon. The time to cecum = 3.5 Withdrawal time = 13.6   The scope was withdrawn and the procedure completed. COMPLICATIONS: There were no immediate complications.  ENDOSCOPIC IMPRESSION: 1.   Moderate diverticulosis in the sigmoid colon 2.    Large grade l internal hemorrhoids  RECOMMENDATIONS: 1.  LGI bleed from diverticulosis or hemorrhoids. If recurrent hemorrhoidal bleeding recommend surgical mgmt 2.  High fiber diet with liberal fluid intake. 3.  Anusol HC supp qd for 5 days and then qd prn. If 4.  OK to resume anticoagulation in 3 days as indicated 5.  Advance diet as tolerated and consider discharge 6.  No GI follow up needed at this time. GI follow up prn  eSigned:  Ladene Artist, MD, [facility] 02/19/2015 10:38 AM     PATIENT NAME:  Jacqueline, [name] MR#: [id number]

## 2015-02-19 NOTE — Progress Notes (Signed)
Family Medicine Teaching Service Daily Progress Note Intern Pager: [phone]  Patient name: Jacqueline [name] Medical record number: [id number] Date of birth: 1931-03-27 Age: 81 y.o. Gender: female  Primary Care Provider: Zigmund Gottron, MD Consultants: GI Code Status: Full  Pt Overview and Major Events to Date:  7/24- admitted with BRBPR  Assessment and Plan: Jacqueline [name] is a 81 y.o. female presenting with BRBPR . PMH is significant for AFib, dCHF, HTN, HLD, anemia & insomnia  Rectal bleeding: BRBPR x2 days. FOBT positive in ED but no active bleeding noticed on rectal exam. Hgb 10.1, about baseline. She had colonoscopy in 2007 that showed multiple rectosigmoid polyps, diverticulosis in descending and sigmoid colon, and grade 1 hypervascular internal hemorrhoid. Pathology at that time showed hyperplastic polyps without adenomatous mucosa. She is now on eliquis for afib. DDx includes internal hemorrhoids and diverticulosis are likely given no abdominal pain and precious colonoscopy finding. Patient had similar episode of bleeding in remote past from internal hemorrhoid. Other possible causes are colon cancer also no family hx & constitutional sxs. Another episode of BRBPR this AM about 6 am. Continue to HDS. Denies dizziness, SOB & CP. Hgb 11.6 (stable). -Admit to tele, attending Dr. Gwendlyn Deutscher -Consulted GI appreciate recs  -scope 7/26. Prepped overnight - Holding eliquis. Will start low dose (half) at follow up in clinic with Dr. Andria Frames in a week. - transfuse if Hgb <7 or becomes unstable  Dyspnea: for two weeks but worse over the last two days. SOB is exertional. Endorses some chest tightness. H/o dCHF. Lung exam with some crackles. BNP 400. Trop negx1. CXR showing pulmonary edema and trace effusions. Supposed to be on home PO lasix daily but has not been taking. No sign of infection. Less likely to be PE while on eliquis. There is no tachycardia or tachypnea either. She doesn't appear fluid  overloaded. No oxygen requirement at this time. SOB and chest tightness improved after lasix IV 20 mg.  UOP of 2.5L with a net of -2.4L. - No diuretics given no SOB and no sign of fluid overload. - oxygen as needed to maintain sats  Anemia: Hgb of 10.1 today (about baseline). MCV 93. Likely anemia of chronic diease based on MCV. Patient asymptomatic.  - will monitor H&H - will transfuse as needed  Elevated creatinine/ CKD-II: sCr 1.55. B/l 1-1.3. Down to 1.44 7/25. Unsure of source at this time. Does not appear prerenal or postrenal. Received fluids in ED.  - Recheck BMP in AM - avoid nephrotoxic agents  Afib: on elquis 5 mg daily and metoprolol 50 mg at home - hold eliquis - continue home metoprolol  HTN: Normotensive.  -Continue home meds  dCHF: EF 55-60 with grade 2 DD in 08/2014. On amlodipine 2.5 mg, IMDUR 30 mg, Metoprolol 50 mg bid at home. Denies taking lasix 20 daily even though on medication list. Unsure what baseline wt appears to be ~137.  - hold lasix after IV 1x dose - consider repeating echo - continue home meds  Hyperlipidemia: lipid panel CH 132, TG 148, LDL 82 HDL 33 in 08/2014. On simvastatin 20 mg at home. - will continue statin  Insomnia: on ativan 0.5 mg and trazodone 50 mg at home - will continue home trazodone prn  FEN/GI: SLIV, NPO pending GI, Protonix Prophylaxis: SCDs  Disposition: home pending scope finding and GI recs  Subjective:  Denies bleeding over night. Denies SOB and CP. She has been HDS. Hgb 11.4. Has been preped for colonoscopy.  Objective:  Temp:  [98.1 F (36.7 C)-98.4 F (36.9 C)] 98.1 F (36.7 C) (07/26 0447) Pulse Rate:  [62-66] 63 (07/26 0447) Resp:  [18] 18 (07/26 0447) BP: (133-161)/(65-69) 155/65 mmHg (07/26 0447) SpO2:  [95 %-98 %] 98 % (07/26 0447) Weight:  [125 lb 10.6 oz (57 kg)] 125 lb 10.6 oz (57 kg) (07/26 0447)  Physical Exam: Gen: NAD. Alert, cooperative with exam HEENT: Oropharynx clear. MMM CV: irregularly  irregular. No murmurs, rubs, or gallops noted. 2+ radial and DP pulses bilaterally. Resp: CTAB. No wheezing, but some crackles at base of her right lung. Abd: +BS. Soft, non-distended, non-tender. No rebound or guarding.  Ext: No edema. No gross deformities. Neuro: Alert and oriented, No gross focal deficits Laboratory:  Recent Labs Lab 02/18/15 0515 02/18/15 1445 02/19/15 0527  WBC 7.8 6.5 5.4  HGB 11.4* 10.6* 11.6*  HCT 33.6* 31.1* 34.5*  PLT 179 168 187    Recent Labs Lab 02/17/15 1006 02/18/15 0515  NA 140 137  K 4.2 3.8  CL 108 102  CO2 22 26  BUN 21* 18  CREATININE 1.55* 1.44*  CALCIUM 9.3 9.5  GLUCOSE 100* 94    Imaging/Diagnostic Tests: No results found.  Mercy Riding, MD 02/19/2015, 8:06 AM PGY-1, [place] Intern pager: [phone], text pages welcome

## 2015-02-19 NOTE — Progress Notes (Signed)
Reviewed discharge paperwork with pt.  Pt denied any needs at this time.  PIV removed.  Pt taken to discharge location via wheelchair. 

## 2015-02-19 NOTE — Discharge Summary (Signed)
[facility] Discharge Summary  Patient name: Jacqueline [name] Medical record number: [id number] Date of birth: [birth year] Age: 81 y.o. Gender: female Date of Admission: 02/17/2015  Date of Discharge: 02/19/2015 Admitting Physician: Kinnie Feil, MD  Primary Care Provider: Zigmund Gottron, MD Consultants: GI  Indication for Hospitalization: Rectal bleeding  Discharge Diagnoses/Problem List:  Patient Active Problem List   Diagnosis Date Noted  . Hematochezia   . Diverticulosis of large intestine without perforation or abscess with bleeding   . Dyspnea   . Acute on chronic diastolic heart failure   . Essential hypertension   . Paroxysmal Atrial fibrillation, unspecified   . CKD (chronic kidney disease)   . Blood loss anemia 10/10/2014   Disposition: home  Discharge Condition: stable  Discharge Exam: see the progress note from the date of discharge  Brief Hospital Course:  Jacqueline [name] is a 81 y.o. female presenting with BRBPR . PMH is significant for AFib, dCHF, HTN, HLD, anemia & insomnia  Rectal bleeding: resolved. BRBPR x2 days. FOBT positive in ED but no active bleeding noticed on rectal exam. Hgb 10.1 (11.1 3 mos prior). Eliquis held. GI evaluated the patient up on admission and recommend observation, bowl rest( clears) given patient has been on Eliquis unless bleeding recurrs. Patient had two episode of subjective bleeding on HD-2. Repeat Hgb 11.4>11.6. She continued to be HDS and asymptomatic. A decision was made to do colonoscopy, which was done on HD-3. Colonoscopy resulted in moderate diverticulosis in the sigmoid colon and large grade l internal hemorrhoids. Patient remained stable after procedure and discharged on Anusol HC suppository.  Dyspnea likley 2/2 to dCHF: Resolved. for two weeks but worse for two days. This is in the setting of not taking her lasix. SOB is exertional. Also chest tightness. H/o dCHF grade 2 DD with EF of 55-60%  onTTE from 09/17/2014. Lung exam with some crackles. CXR showing pulmonary edema and trace effusions. BNP 400. Trop negx1. Marland Kitchen No sign of infection.  Received lasix IV 20 mg once with good UOP of 2.5L with a net of -2.4L. Dyspnea and chest tightness resolved.   Elevated creatinine/ CKD-II: Improved. sCr 1.55. B/l 1-1.3. Down to 1.44 7/25. No clear explanation but improving.  Afib: on elquis 5 mg daily and metoprolol 50 mg at home. Held her Eliquis to be restarted at low dose in clinic at follow up.  Other chronic conditions stable.  Issues for Follow Up:  1. Afib: held her Eliquis during this hospitalization. Consider restarting at low dose (2.5mg ) given her wt (<60kg) and age (>37yrs). 2. GI bleeding/anemia: bleeding resolved. Hgb stable in hospital. Recommend CBC on follow up 3. Elevated Cr: 1.55>1.44. Recommend checking BMP on follow up  Significant Procedures: colonoscopy  Significant Labs and Imaging:   Recent Labs Lab 02/18/15 0515 02/18/15 1445 02/19/15 0527  WBC 7.8 6.5 5.4  HGB 11.4* 10.6* 11.6*  HCT 33.6* 31.1* 34.5*  PLT 179 168 187    Recent Labs Lab 02/17/15 1006 02/18/15 0515  NA 140 137  K 4.2 3.8  CL 108 102  CO2 22 26  GLUCOSE 100* 94  BUN 21* 18  CREATININE 1.55* 1.44*  CALCIUM 9.3 9.5    Results/Tests Pending at Time of Discharge: none  Discharge Medications:    Medication List    STOP taking these medications        apixaban 5 MG Tabs tablet  Commonly known as:  ELIQUIS      TAKE these  medications        acetaminophen 500 MG tablet  Commonly known as:  TYLENOL  Take 500-1,000 mg by mouth every 6 (six) hours as needed for moderate pain or headache.     alendronate 70 MG tablet  Commonly known as:  FOSAMAX  Take 1 tablet (70 mg total) by mouth once a week. Take with a full glass of water on an empty stomach, remain upright     ALPRAZolam 0.5 MG tablet  Commonly known as:  XANAX  Take 1 tablet (0.5 mg total) by mouth 2 (two) times daily  as needed for anxiety.     amLODipine 2.5 MG tablet  Commonly known as:  NORVASC  Take 1 tablet (2.5 mg total) by mouth daily.     ARTIFICIAL TEARS OP  Place 1 drop into both eyes 3 (three) times daily as needed (dry eyes).     diphenhydrAMINE 25 MG tablet  Commonly known as:  BENADRYL  Take 50 mg by mouth 2 (two) times daily as needed for allergies (eye allergies).     Fish Oil 1000 MG Caps  Take 1-2 capsules by mouth 2 (two) times daily. Takes one in the morning and two capsules at night.     furosemide 20 MG tablet  Commonly known as:  LASIX  Take 1 tablet (20 mg total) by mouth daily.     hydrocortisone 25 MG suppository  Commonly known as:  ANUSOL-HC  Place 1 suppository (25 mg total) rectally 2 (two) times daily.     isosorbide mononitrate 30 MG 24 hr tablet  Commonly known as:  IMDUR  Take 1 tablet (30 mg total) by mouth daily.     loperamide 2 MG capsule  Commonly known as:  IMODIUM  Take 1 capsule (2 mg total) by mouth as needed for diarrhea or loose stools.     metoprolol 50 MG tablet  Commonly known as:  LOPRESSOR  Take 1 tablet (50 mg total) by mouth 2 (two) times daily.     nitroGLYCERIN 0.4 MG SL tablet  Commonly known as:  NITROSTAT  Place 1 tablet (0.4 mg total) under the tongue every 5 (five) minutes as needed for chest pain.     omeprazole 20 MG capsule  Commonly known as:  PRILOSEC  Take 1 capsule (20 mg total) by mouth daily.     polyethylene glycol packet  Commonly known as:  MIRALAX / GLYCOLAX  Take 17 g by mouth 2 (two) times daily.     rOPINIRole 0.25 MG tablet  Commonly known as:  REQUIP  Take 1 tablet (0.25 mg total) by mouth 3 (three) times daily.     simvastatin 40 MG tablet  Commonly known as:  ZOCOR  Take 0.5 tablets (20 mg total) by mouth at bedtime.     tiZANidine 4 MG tablet  Commonly known as:  ZANAFLEX  Take 1 tablet (4 mg total) by mouth every 8 (eight) hours as needed for muscle spasms.     traMADol 50 MG tablet   Commonly known as:  ULTRAM  TAKE 1 TABLET BY MOUTH TWICE A DAY     traZODone 50 MG tablet  Commonly known as:  DESYREL  Take 1 tablet (50 mg total) by mouth at bedtime.     vitamin B-12 100 MCG tablet  Commonly known as:  CYANOCOBALAMIN  Take 2,000 mcg by mouth 2 (two) times daily.     Vitamin D-3 1000 UNITS Caps  Take 2,000 Units by  mouth 2 (two) times daily.        Discharge Instructions: Please refer to Patient Instructions section of EMR for full details.  Patient was counseled important signs and symptoms that should prompt return to medical care, changes in medications, dietary instructions, activity restrictions, and follow up appointments.   Follow-Up Appointments:   Mercy Riding, MD 02/19/2015, 6:35 PM PGY-1, [place]

## 2015-02-19 NOTE — Care Management Note (Addendum)
Case Management Note  Patient Details  Name: Jacqueline [name] MRN: [id number] Date of Birth: 02-14-1931  Subjective/Objective:                 Patient from home, receives care from children. Placed in obs with GI bleed, will discharge today after colonoscopy if clear per RN.   Action/Plan:  Expect to discharge to home; self care. Expected Discharge Date:                  Expected Discharge Plan:  Home/Self Care  In-House Referral:     Discharge planning Services  CM Consult  Post Acute Care Choice:    Choice offered to:     DME Arranged:    DME Agency:     HH Arranged:    HH Agency:     Status of Service:  In process, will continue to follow  Medicare Important Message Given:    Date Medicare IM Given:    Medicare IM give by:    Date Additional Medicare IM Given:    Additional Medicare Important Message give by:     If discussed at [place] of Stay Meetings, dates discussed:    Additional Comments:  Carles Collet, RN 02/19/2015, 11:09 AM

## 2015-02-19 NOTE — Transfer of Care (Signed)
Immediate Anesthesia Transfer of Care Note  Patient: Jacqueline [name]  Procedure(s) Performed: Procedure(s): COLONOSCOPY (N/A)  Patient Location: PACU  Anesthesia Type:MAC  Level of Consciousness: awake, alert , oriented and patient cooperative  Airway & Oxygen Therapy: Patient Spontanous Breathing and Patient connected to face mask oxygen  Post-op Assessment: Report given to RN, Post -op Vital signs reviewed and stable and Patient moving all extremities  Post vital signs: Reviewed and stable  Last Vitals:  Filed Vitals:   02/19/15 0926  BP: 189/63  Pulse:   Temp: 36.9 C  Resp:     Complications: No apparent anesthesia complications

## 2015-02-20 ENCOUNTER — Encounter (HOSPITAL_COMMUNITY): Payer: Self-pay | Admitting: Gastroenterology

## 2015-02-20 ENCOUNTER — Telehealth: Payer: Self-pay | Admitting: Family Medicine

## 2015-02-20 NOTE — Telephone Encounter (Signed)
Called.  Do not fill  The suppository.

## 2015-02-20 NOTE — Telephone Encounter (Signed)
Pt called because she was released from the hospital and given a prescription for some suppositories.The issue is that they are $130.00 to pick up and she can not afford this. She wanted to know if the doctor can call in something cheaper or if she really doesn't need this can we let her know. jw

## 2015-03-01 ENCOUNTER — Encounter: Payer: Self-pay | Admitting: Family Medicine

## 2015-03-01 ENCOUNTER — Ambulatory Visit (INDEPENDENT_AMBULATORY_CARE_PROVIDER_SITE_OTHER): Payer: PPO | Admitting: Family Medicine

## 2015-03-01 VITALS — BP 132/72 | HR 52 | Temp 98.2°F | Ht 61.0 in | Wt 134.4 lb

## 2015-03-01 DIAGNOSIS — I10 Essential (primary) hypertension: Secondary | ICD-10-CM

## 2015-03-01 DIAGNOSIS — I519 Heart disease, unspecified: Secondary | ICD-10-CM | POA: Diagnosis not present

## 2015-03-01 DIAGNOSIS — I480 Paroxysmal atrial fibrillation: Secondary | ICD-10-CM

## 2015-03-01 DIAGNOSIS — K219 Gastro-esophageal reflux disease without esophagitis: Secondary | ICD-10-CM | POA: Diagnosis not present

## 2015-03-01 MED ORDER — OMEPRAZOLE 20 MG PO CPDR: 20.0000 mg | DELAYED_RELEASE_CAPSULE | Freq: Every day | ORAL | Status: DC

## 2015-03-01 NOTE — Assessment & Plan Note (Signed)
Good control.  Stay off amlodipine.

## 2015-03-01 NOTE — Assessment & Plan Note (Signed)
She decided aspirin, not eliquis

## 2015-03-01 NOTE — Patient Instructions (Addendum)
I want you to start taking a 81 mg aspirin every day to prevent stroke with atrial fibrillation.  No more eliquis. Weigh every day.  If your weight is 132 or above, take furosemide that day.  If it is 131 or less, do not take furosemide. See me for your annual.   Stay off the amlodipine OK to take the tramadol up to three times per day.

## 2015-03-01 NOTE — Progress Notes (Signed)
   Subjective:    Patient ID: Jacqueline [name], female    DOB: August 23, 1930, 81 y.o.   MRN: [id number]  HPI SEveral issues 1. Hospital FU - GI bleed from internal hemorrhoids.  Does not want surg.  No further bleeding off eliquis 2. A fib, needs anticoag.  Was on Eliquis (but it was prohibitively expensive.  $300+ per month and income is only $1400/month.)  Long discussion about risk benefit.   3. Chronic back pain.  Poorly controled.  Only taking tramadol at night.  Tramadol does not make sleepy or cause other side effects. 4. Off amlodipine because of low home and Hosp BPs   Review of Systems     Objective:   Physical Exam BP repeated Lungs clear Cardiac nicely rate controled        Assessment & Plan:

## 2015-03-05 NOTE — Patient Outreach (Signed)
[facility]) Care Management  03/05/2015  Jacqueline [name] 1930/11/14 [id number]   Referral from HTA tier 4 list, assigned to Sherrin Daisy, [facility] for patient outreach.  Kendell Sagraves L. Destiney Sanabia, [place] Care Management Assistant

## 2015-03-14 ENCOUNTER — Telehealth: Payer: Self-pay | Admitting: Family Medicine

## 2015-03-14 DIAGNOSIS — R21 Rash and other nonspecific skin eruption: Secondary | ICD-10-CM

## 2015-03-14 MED ORDER — KETOCONAZOLE 2 % EX CREA: 1.0000 "application " | TOPICAL_CREAM | Freq: Every day | CUTANEOUS | Status: DC

## 2015-03-14 NOTE — Telephone Encounter (Signed)
Has a rash under her right breast and on the side of her face. It looks like the yeast infection she had years ago. She has an appt next wed.  cortizone cream isnt stopping the itch Could he call in something for this? Please advise

## 2015-03-14 NOTE — Assessment & Plan Note (Signed)
Will rx as if yeast.

## 2015-03-18 ENCOUNTER — Ambulatory Visit (INDEPENDENT_AMBULATORY_CARE_PROVIDER_SITE_OTHER): Payer: PPO | Admitting: Family Medicine

## 2015-03-18 VITALS — BP 158/76 | HR 55 | Temp 99.0°F | Ht 63.0 in | Wt 134.8 lb

## 2015-03-18 DIAGNOSIS — L564 Polymorphous light eruption: Secondary | ICD-10-CM | POA: Diagnosis not present

## 2015-03-18 DIAGNOSIS — L304 Erythema intertrigo: Secondary | ICD-10-CM | POA: Insufficient documentation

## 2015-03-18 MED ORDER — NYSTATIN 100000 UNIT/GM EX POWD: CUTANEOUS | Status: DC

## 2015-03-18 MED ORDER — TRIAMCINOLONE ACETONIDE 0.1 % EX CREA: 1.0000 "application " | TOPICAL_CREAM | Freq: Two times a day (BID) | CUTANEOUS | Status: DC | PRN

## 2015-03-18 NOTE — Patient Instructions (Signed)
Thank you for coming in today!  - Start applying kenalog (steroid) cream to the area on your face and shoulder and arm as needed. Do not use this for more than 2 weeks in a row.  - Place nystatin (antifungal) powder under the left breast 3 times a day and keep the area as dry as possible to treat the fungus causing the redness. Do this until the area is completely better.   Our clinic's number is [phone]. Feel free to call any time with questions or concerns. We will answer any questions after hours with our 24-hour emergency line at that number as well.   - Dr. Bonner Puna

## 2015-03-20 ENCOUNTER — Encounter: Payer: PPO | Admitting: Family Medicine

## 2015-03-21 DIAGNOSIS — L564 Polymorphous light eruption: Secondary | ICD-10-CM | POA: Insufficient documentation

## 2015-03-21 NOTE — Assessment & Plan Note (Signed)
Typical morphology. Consideration given to contact dermatitis given odd distribution and zoster given facial distribution without crossing midline but doubt these in the absence of vesicles. Rx medium potency topical steroid and monitor.

## 2015-03-21 NOTE — Assessment & Plan Note (Signed)
Emphasized importance of keeping area ventilated and dry, Rx nystatin powder.

## 2015-03-21 NOTE — Progress Notes (Signed)
Subjective: CECILI AURIEMMA is a 81 y.o. female presenting for rashes.  Describes 2 rashes:  - The first developed weeks-months ago under her left breast, red painful, constant and worsening. Only somewhat improved with cream sent in by PCP for this. She has not been wearing a bra due to pain. Denies involvement of other breast, inguinal folds or axillae.  - The second is an itchy grouping of red spots over the past few days on her face and a small eruption on her right shoulder and forearm. Nothing tried for this.   - ROS: Denies fevers, eye complaints, joint complaints.  - Non-smoker  Objective: BP 158/76 mmHg  Pulse 55  Temp(Src) 99 F (37.2 C) (Oral)  Ht 5\' 3"  (1.6 m)  Wt 134 lb 12.8 oz (61.145 kg)  BMI 23.88 kg/m2 Gen: Well-appearing elderly female in no distress Skin: solid bright red erythema in left inframammary fold with 2 satellite lesions. Area is moist. Also with fine naculopapular papules on erythematous patch lateral to left eye without vesicles. Similar eruption with only 2 papules on dorsal right shoulder and dorsal right forearm.  HEENT: Eyes normal  Assessment/Plan: SHARYLE VANDERBERG is a 81 y.o. female here for rashes.  See problem list for plan.

## 2015-03-25 ENCOUNTER — Telehealth: Payer: Self-pay | Admitting: Family Medicine

## 2015-03-25 NOTE — Telephone Encounter (Signed)
Pt calling because triamcinolone cream (KENALOG) 0.1 % that was rx'd at last visit is out and she needs more considering the "yeast infection" that she was diagnosed with is "popping up" all over her left side.

## 2015-03-26 NOTE — Telephone Encounter (Signed)
Pt is returning Dr. Lowella Bandy call. jw

## 2015-03-27 ENCOUNTER — Encounter: Payer: Self-pay | Admitting: Family Medicine

## 2015-03-27 ENCOUNTER — Ambulatory Visit (INDEPENDENT_AMBULATORY_CARE_PROVIDER_SITE_OTHER): Payer: PPO | Admitting: Family Medicine

## 2015-03-27 VITALS — BP 164/78 | HR 56 | Temp 98.2°F | Ht 61.0 in | Wt 134.3 lb

## 2015-03-27 DIAGNOSIS — Z0000 Encounter for general adult medical examination without abnormal findings: Secondary | ICD-10-CM | POA: Diagnosis not present

## 2015-03-27 DIAGNOSIS — Z1239 Encounter for other screening for malignant neoplasm of breast: Secondary | ICD-10-CM | POA: Diagnosis not present

## 2015-03-27 DIAGNOSIS — M810 Age-related osteoporosis without current pathological fracture: Secondary | ICD-10-CM | POA: Diagnosis not present

## 2015-03-27 DIAGNOSIS — I5032 Chronic diastolic (congestive) heart failure: Secondary | ICD-10-CM | POA: Diagnosis not present

## 2015-03-27 DIAGNOSIS — D500 Iron deficiency anemia secondary to blood loss (chronic): Secondary | ICD-10-CM

## 2015-03-27 DIAGNOSIS — Z23 Encounter for immunization: Secondary | ICD-10-CM | POA: Diagnosis not present

## 2015-03-27 DIAGNOSIS — I10 Essential (primary) hypertension: Secondary | ICD-10-CM

## 2015-03-27 DIAGNOSIS — I480 Paroxysmal atrial fibrillation: Secondary | ICD-10-CM

## 2015-03-27 LAB — CBC
HEMATOCRIT: 33 % — AB (ref 36.0–46.0)
Hemoglobin: 11.2 g/dL — ABNORMAL LOW (ref 12.0–15.0)
MCH: 31.1 pg (ref 26.0–34.0)
MCHC: 33.9 g/dL (ref 30.0–36.0)
MCV: 91.7 fL (ref 78.0–100.0)
MPV: 11.5 fL (ref 8.6–12.4)
Platelets: 169 10*3/uL (ref 150–400)
RBC: 3.6 MIL/uL — ABNORMAL LOW (ref 3.87–5.11)
RDW: 13.9 % (ref 11.5–15.5)
WBC: 4.5 10*3/uL (ref 4.0–10.5)

## 2015-03-27 MED ORDER — ALENDRONATE SODIUM 70 MG PO TABS: 70.0000 mg | ORAL_TABLET | ORAL | Status: DC

## 2015-03-27 MED ORDER — TRIAMCINOLONE ACETONIDE 0.1 % EX CREA: 1.0000 "application " | TOPICAL_CREAM | Freq: Two times a day (BID) | CUTANEOUS | Status: DC | PRN

## 2015-03-27 NOTE — Patient Instructions (Addendum)
You will get a flu shot today. I sent in refills for your fosamax and steroid cream.  Have the pharmacy contact me for other needed refills. Use the steroid cream on the rash.   I do recommend a mammogram I will call with your blood count results. I hope your brother does OK.  Write down your blood pressures for one month and give me the results.  I need to decide whether to increase your blood pressure medications. This was your annual wellness exam.

## 2015-03-27 NOTE — Assessment & Plan Note (Signed)
On ASA.  Will recheck hgb.  Seems to be in sinus today.

## 2015-03-27 NOTE — Assessment & Plan Note (Signed)
Recommend mammo due to good overall health.

## 2015-03-27 NOTE — Telephone Encounter (Signed)
Called.  She has an appointment to see me today at 1:45 and we will handle the refill then.  Also, brother is in the hospital with a "massive heart attack."  He is in critical condition.

## 2015-03-27 NOTE — Assessment & Plan Note (Signed)
I am reluctant to increase antihypertensive meds yet.  Especially given her lightheadedness.  Will have her bring in copies of home BPs before making any changes.

## 2015-03-27 NOTE — Progress Notes (Signed)
   Subjective:    Patient ID: Jacqueline [name], female    DOB: 08/09/1930, 81 y.o.   MRN: [id number]  HPI  Annual wellness exam C/O rash on left trunk. Pruritic Non smoker.  No alcohol.  No at risk behavior. Active, still works two days per week.  No specific exercise Wt is in ideal range.  She did have some unexplained wt loss but that has stabilized.   Due for a flu shot. Has not had mammo for ~3 years.  Given overall good health, I recommend. BP monitors at home and is good most days.  Does complain of some lightheadedness. CHF control is good based on lasix prn dosing by weight. Last lipid panel in Feb 2016  On statin.    Review of Systems Mild DOE.  Lightheadedness on standing.  No further gross GI bleed.  Denies skin, appetite or bowel changes.     Objective:   Physical Exam VS including high BP noted.   HEENT normal Neck without sig nodes Lungs clear Cardiac RRR without m or g Abd benign Ext no edema. Neuro grossly normal        Assessment & Plan:

## 2015-03-27 NOTE — Assessment & Plan Note (Signed)
Reinforced continued need for fosamax.

## 2015-03-27 NOTE — Assessment & Plan Note (Signed)
Check hgb.

## 2015-03-27 NOTE — Assessment & Plan Note (Signed)
Check

## 2015-03-27 NOTE — Assessment & Plan Note (Signed)
Stable on current med regimen.

## 2015-04-02 ENCOUNTER — Telehealth: Payer: Self-pay | Admitting: Family Medicine

## 2015-04-02 ENCOUNTER — Other Ambulatory Visit: Payer: Self-pay | Admitting: *Deleted

## 2015-04-02 DIAGNOSIS — I10 Essential (primary) hypertension: Secondary | ICD-10-CM

## 2015-04-02 NOTE — Telephone Encounter (Signed)
bp readings are as follows: 8/31  144/77   64 pulse 9/1    117/61   61 9/2   98/58      71         134/66    65 9/3    134/63   62 9/4    150/76   65           124/65   61 9/5      121/63   64 Also pts brother is in the hospital after a heart attack.  He also has C-diff.  Pt is wondering if it is safe to visit him. Please advise

## 2015-04-02 NOTE — Patient Outreach (Signed)
[facility]) Care Management  04/02/2015  Jacqueline [name] Mar 26, 1931 [id number]   Referral from [employer] tier 4 list.    Telephone call to patient; left HIPPA compliant voice mail requesting return call.  Plan: will follow up. Sherrin Daisy, RN BSN [place] Management Coordinator [facility] Care Management  [phone]

## 2015-04-03 NOTE — Telephone Encounter (Signed)
Called and discussed.  No change in hypertension treatment.

## 2015-04-03 NOTE — Assessment & Plan Note (Signed)
Home BPs are nicely reassuring, no change in treatment.

## 2015-04-04 ENCOUNTER — Other Ambulatory Visit: Payer: PPO | Admitting: *Deleted

## 2015-04-04 NOTE — Patient Outreach (Signed)
[facility]) Care Management  04/04/2015  Jacqueline [name] 27-Nov-1930 [id number]   Responding to return call from patient via voice mail. Telephone call to patient; HIPPA compliant voice mail left requesting call back.  Plan: Will follow up.  Sherrin Daisy, RN BSN [place] Management Coordinator [facility] Care Management  [phone]

## 2015-04-05 ENCOUNTER — Encounter: Payer: Self-pay | Admitting: *Deleted

## 2015-04-05 ENCOUNTER — Ambulatory Visit
Admission: RE | Admit: 2015-04-05 | Discharge: 2015-04-05 | Disposition: A | Discharge status: Home / Self Care | Payer: PPO | Source: Ambulatory Visit | Attending: Family Medicine | Admitting: Family Medicine

## 2015-04-05 ENCOUNTER — Other Ambulatory Visit: Payer: Self-pay | Admitting: *Deleted

## 2015-04-05 DIAGNOSIS — Z1239 Encounter for other screening for malignant neoplasm of breast: Secondary | ICD-10-CM

## 2015-04-05 NOTE — Patient Outreach (Signed)
Jacqueline [name]) Care Management  04/05/2015  RAELEE [name] January 15, 1931 [id number]   Return telephone call to patient; who was advised of reason for call and of [facility] care management services. HIPPA verification received from patient.  Patient reports that she recently had routine appointment with primary care and physical examination and lab results were good. States she has no changes in medication or new prescriptions. States she gets prescriptions filled at local pharmacy . Patient voices that she is compliant with taking medications as prescribed and knows why she is taking medications. States she has heart failure which is currently under control. Reports that she has workable scales & blood pressure monitor; weighs daily and takes blood pressure daily. States she is aware is action plan to put in place for yellow and red zones of heart failure. Has routine appointment with cardiologist 04/22/2015.  Patient reports that she is update with flu, shingles and pneumonia vaccines & mammogram.  Reports that she is independent and drives self to doctors' appointments. States she has support from family members who lives close by if needed.   Falls prevention strategies discussed with patient. States she had fallen 6 months ago without injuries. Voices that she has medic alert necklace.   Patient voices that she does not have any major health concerns presently and no case/disease management needs.  Based on this RN CM assessment,  patient has good working knowledge of self management of her chronic health condition. No recommendations for [facility] care management services at this time.   Plan: Send MD closure letter. Close case.  Jacqueline Daisy, RN BSN [place] Management Coordinator [facility] Care Management  [phone]

## 2015-04-05 NOTE — Patient Outreach (Signed)
[place] [facility]) Care Management  04/05/2015  Jacqueline [name] 29-Sep-1930 [id number]   Notification received from Sherrin Daisy, [facility] to close case due to patient being assessed and no further interventions required.  [name] L. Timothea Bodenheimer, [place] Care Management Assistant

## 2015-04-12 ENCOUNTER — Telehealth: Payer: Self-pay | Admitting: Family Medicine

## 2015-04-12 NOTE — Telephone Encounter (Signed)
Mrs. Psomas called about some bumps under her arm.  At last visit she said she was told that the one she had was a hair bump, but now she has developed 3 more and she's worried because they are painful.  Would like to speak with provider to see if she need to put something on it or be seen about it.  You can reach her on her cell phone if you call her now.  She's own her way to the pharmacy.

## 2015-04-12 NOTE — Telephone Encounter (Signed)
Also brother died.  "It is a relief."  He had been in the hospital for three weeks.  Arms sound like folliculitis.  Suggested warm compresses and neosporin.

## 2015-04-15 ENCOUNTER — Ambulatory Visit (INDEPENDENT_AMBULATORY_CARE_PROVIDER_SITE_OTHER): Payer: PPO | Admitting: Family Medicine

## 2015-04-15 ENCOUNTER — Telehealth: Payer: Self-pay | Admitting: Family Medicine

## 2015-04-15 VITALS — BP 171/80 | HR 57 | Temp 98.1°F | Wt 134.9 lb

## 2015-04-15 DIAGNOSIS — L02429 Furuncle of limb, unspecified: Secondary | ICD-10-CM | POA: Diagnosis not present

## 2015-04-15 MED ORDER — CEPHALEXIN 500 MG PO CAPS: 500.0000 mg | ORAL_CAPSULE | Freq: Four times a day (QID) | ORAL | Status: AC

## 2015-04-15 NOTE — Patient Instructions (Signed)
Thank you for coming to see me today. It was a pleasure. Today we talked about:   Skin lesion: This looks like a small skin infection that has caused some swelling. We discussed using a warm compress as you have already been doing. I will start an antibiotic as well for 10 days. If you develop worsening symptoms, fevers, nausea/vomiting, please return. If this has not improved in the next 5-7 days, please return for follow-up.  If you have any questions or concerns, please do not hesitate to call the office at [phone].  Sincerely,  Cordelia Poche, MD

## 2015-04-15 NOTE — Telephone Encounter (Signed)
Jacqueline [name] have a sister she would like you to take as a new patient.  Her name is Jacqueline [name] and her phone number is [phone].  [name] said we could call her if you decide to take her.  I will make the appt for her if you give me the "green light" .

## 2015-04-15 NOTE — Progress Notes (Signed)
    Subjective   Jacqueline [name] is a 81 y.o. female that presents for a same day visit  1. Knots in left underarm: Symptoms started about 3 weeks ago. She states she initially had one but has now increased to 4-5. She was told to use neosporin and warm compesses when she called in last week which have not helped. Knots are hard and red. No fevers, nausea, vomiting. She has not noticed any other lesions. This has not happened before. No family history of breast cancer.  ROS Per HPI  Social History  Substance Use Topics  . Smoking status: Never Smoker   . Smokeless tobacco: Never Used  . Alcohol Use: No    Allergies  Allergen Reactions  . Atorvastatin Other (See Comments)    Myalgias in legs  . Crestor [Rosuvastatin] Other (See Comments)    Myalgias in legs  . Morphine And Related Other (See Comments)    "Crazy thoughts, severe headache, sick"    Objective   BP 171/80 mmHg  Pulse 57  Temp(Src) 98.1 F (36.7 C) (Oral)  Wt 134 lb 14.4 oz (61.19 kg)  General: Well appearing, no distress Skin: Left axilla with multiple furuncles with one appearing to be coming to a head. Some erythema and mild tenderness. Neuro: Well appearing      Assessment and Plan   Meds ordered this encounter  Medications  . cephALEXin (KEFLEX) 500 MG capsule    Sig: Take 1 capsule (500 mg total) by mouth 4 (four) times daily.    Dispense:  40 capsule    Refill:  0   Furuncle, multiple: no history of MRSA. No deep infection noted.  Keflex 500mg  QID x10 days  Continue warm compresses  Tylenol for pain prn  Return precautions given

## 2015-04-15 NOTE — Telephone Encounter (Signed)
sure

## 2015-04-23 ENCOUNTER — Ambulatory Visit: Payer: PPO | Admitting: Family Medicine

## 2015-04-23 ENCOUNTER — Telehealth: Payer: Self-pay | Admitting: Family Medicine

## 2015-04-23 ENCOUNTER — Emergency Department (HOSPITAL_COMMUNITY): Payer: PPO

## 2015-04-23 ENCOUNTER — Observation Stay (HOSPITAL_COMMUNITY)
Admission: EM | Admit: 2015-04-23 | Discharge: 2015-04-25 | Disposition: A | Discharge status: Home / Self Care | Payer: PPO | Attending: Family Medicine | Admitting: Family Medicine

## 2015-04-23 ENCOUNTER — Encounter (HOSPITAL_COMMUNITY): Payer: Self-pay | Admitting: *Deleted

## 2015-04-23 DIAGNOSIS — E780 Pure hypercholesterolemia: Secondary | ICD-10-CM | POA: Diagnosis not present

## 2015-04-23 DIAGNOSIS — R531 Weakness: Secondary | ICD-10-CM | POA: Insufficient documentation

## 2015-04-23 DIAGNOSIS — M810 Age-related osteoporosis without current pathological fracture: Secondary | ICD-10-CM | POA: Insufficient documentation

## 2015-04-23 DIAGNOSIS — N189 Chronic kidney disease, unspecified: Secondary | ICD-10-CM | POA: Diagnosis present

## 2015-04-23 DIAGNOSIS — Z96652 Presence of left artificial knee joint: Secondary | ICD-10-CM | POA: Diagnosis not present

## 2015-04-23 DIAGNOSIS — M545 Low back pain: Secondary | ICD-10-CM | POA: Insufficient documentation

## 2015-04-23 DIAGNOSIS — N183 Chronic kidney disease, stage 3 (moderate): Secondary | ICD-10-CM | POA: Insufficient documentation

## 2015-04-23 DIAGNOSIS — Z885 Allergy status to narcotic agent status: Secondary | ICD-10-CM | POA: Insufficient documentation

## 2015-04-23 DIAGNOSIS — I509 Heart failure, unspecified: Secondary | ICD-10-CM

## 2015-04-23 DIAGNOSIS — Z96651 Presence of right artificial knee joint: Secondary | ICD-10-CM | POA: Diagnosis not present

## 2015-04-23 DIAGNOSIS — K219 Gastro-esophageal reflux disease without esophagitis: Secondary | ICD-10-CM | POA: Diagnosis not present

## 2015-04-23 DIAGNOSIS — G2581 Restless legs syndrome: Secondary | ICD-10-CM | POA: Diagnosis not present

## 2015-04-23 DIAGNOSIS — Z888 Allergy status to other drugs, medicaments and biological substances status: Secondary | ICD-10-CM | POA: Diagnosis not present

## 2015-04-23 DIAGNOSIS — I129 Hypertensive chronic kidney disease with stage 1 through stage 4 chronic kidney disease, or unspecified chronic kidney disease: Secondary | ICD-10-CM | POA: Diagnosis not present

## 2015-04-23 DIAGNOSIS — M542 Cervicalgia: Secondary | ICD-10-CM | POA: Diagnosis not present

## 2015-04-23 DIAGNOSIS — E785 Hyperlipidemia, unspecified: Secondary | ICD-10-CM | POA: Diagnosis not present

## 2015-04-23 DIAGNOSIS — M25561 Pain in right knee: Secondary | ICD-10-CM | POA: Diagnosis not present

## 2015-04-23 DIAGNOSIS — Z951 Presence of aortocoronary bypass graft: Secondary | ICD-10-CM | POA: Insufficient documentation

## 2015-04-23 DIAGNOSIS — M2550 Pain in unspecified joint: Secondary | ICD-10-CM | POA: Diagnosis present

## 2015-04-23 DIAGNOSIS — I43 Cardiomyopathy in diseases classified elsewhere: Secondary | ICD-10-CM

## 2015-04-23 DIAGNOSIS — R109 Unspecified abdominal pain: Secondary | ICD-10-CM | POA: Diagnosis present

## 2015-04-23 DIAGNOSIS — M25562 Pain in left knee: Secondary | ICD-10-CM | POA: Insufficient documentation

## 2015-04-23 DIAGNOSIS — I480 Paroxysmal atrial fibrillation: Secondary | ICD-10-CM | POA: Diagnosis not present

## 2015-04-23 DIAGNOSIS — M159 Polyosteoarthritis, unspecified: Secondary | ICD-10-CM | POA: Diagnosis not present

## 2015-04-23 DIAGNOSIS — I119 Hypertensive heart disease without heart failure: Secondary | ICD-10-CM | POA: Diagnosis present

## 2015-04-23 DIAGNOSIS — I2510 Atherosclerotic heart disease of native coronary artery without angina pectoris: Secondary | ICD-10-CM | POA: Insufficient documentation

## 2015-04-23 LAB — CBC WITH DIFFERENTIAL/PLATELET
BASOS ABS: 0 10*3/uL (ref 0.0–0.1)
Basophils Relative: 0 %
Eosinophils Absolute: 0.1 10*3/uL (ref 0.0–0.7)
Eosinophils Relative: 1 %
HEMATOCRIT: 28.4 % — AB (ref 36.0–46.0)
Hemoglobin: 9.5 g/dL — ABNORMAL LOW (ref 12.0–15.0)
LYMPHS PCT: 9 %
Lymphs Abs: 0.9 10*3/uL (ref 0.7–4.0)
MCH: 31 pg (ref 26.0–34.0)
MCHC: 33.5 g/dL (ref 30.0–36.0)
MCV: 92.8 fL (ref 78.0–100.0)
Monocytes Absolute: 0.6 10*3/uL (ref 0.1–1.0)
Monocytes Relative: 6 %
NEUTROS ABS: 8.3 10*3/uL — AB (ref 1.7–7.7)
Neutrophils Relative %: 84 %
PLATELETS: 168 10*3/uL (ref 150–400)
RBC: 3.06 MIL/uL — AB (ref 3.87–5.11)
RDW: 14 % (ref 11.5–15.5)
WBC: 9.9 10*3/uL (ref 4.0–10.5)

## 2015-04-23 LAB — URINALYSIS, ROUTINE W REFLEX MICROSCOPIC
BILIRUBIN URINE: NEGATIVE
Glucose, UA: NEGATIVE mg/dL
Hgb urine dipstick: NEGATIVE
Ketones, ur: NEGATIVE mg/dL
NITRITE: NEGATIVE
Protein, ur: NEGATIVE mg/dL
SPECIFIC GRAVITY, URINE: 1.024 (ref 1.005–1.030)
Urobilinogen, UA: 0.2 mg/dL (ref 0.0–1.0)
pH: 6 (ref 5.0–8.0)

## 2015-04-23 LAB — CBC
HEMATOCRIT: 29.7 % — AB (ref 36.0–46.0)
Hemoglobin: 9.8 g/dL — ABNORMAL LOW (ref 12.0–15.0)
MCH: 30.4 pg (ref 26.0–34.0)
MCHC: 33 g/dL (ref 30.0–36.0)
MCV: 92.2 fL (ref 78.0–100.0)
Platelets: 153 10*3/uL (ref 150–400)
RBC: 3.22 MIL/uL — AB (ref 3.87–5.11)
RDW: 13.9 % (ref 11.5–15.5)
WBC: 7.6 10*3/uL (ref 4.0–10.5)

## 2015-04-23 LAB — COMPREHENSIVE METABOLIC PANEL
ALBUMIN: 3.1 g/dL — AB (ref 3.5–5.0)
ALK PHOS: 70 U/L (ref 38–126)
ALT: 15 U/L (ref 14–54)
ANION GAP: 8 (ref 5–15)
AST: 23 U/L (ref 15–41)
BUN: 27 mg/dL — ABNORMAL HIGH (ref 6–20)
CALCIUM: 8.7 mg/dL — AB (ref 8.9–10.3)
CO2: 24 mmol/L (ref 22–32)
Chloride: 105 mmol/L (ref 101–111)
Creatinine, Ser: 1.51 mg/dL — ABNORMAL HIGH (ref 0.44–1.00)
GFR, EST AFRICAN AMERICAN: 36 mL/min — AB (ref >60)
GFR, EST NON AFRICAN AMERICAN: 31 mL/min — AB (ref >60)
GLUCOSE: 89 mg/dL (ref 65–99)
POTASSIUM: 3.7 mmol/L (ref 3.5–5.1)
Sodium: 137 mmol/L (ref 135–145)
TOTAL PROTEIN: 6.7 g/dL (ref 6.5–8.1)
Total Bilirubin: 0.7 mg/dL (ref 0.3–1.2)

## 2015-04-23 LAB — CREATININE, SERUM
Creatinine, Ser: 1.62 mg/dL — ABNORMAL HIGH (ref 0.44–1.00)
GFR calc non Af Amer: 28 mL/min — ABNORMAL LOW (ref >60)
GFR, EST AFRICAN AMERICAN: 33 mL/min — AB (ref >60)

## 2015-04-23 LAB — URINE MICROSCOPIC-ADD ON

## 2015-04-23 LAB — C-REACTIVE PROTEIN: CRP: 24.7 mg/dL — AB (ref <1.0)

## 2015-04-23 MED ORDER — ROPINIROLE HCL 0.5 MG PO TABS
0.2500 mg | ORAL_TABLET | Freq: Three times a day (TID) | ORAL | Status: DC
  Administered 2015-04-23 – 2015-04-25 (×5): 0.25 mg via ORAL
  Filled 2015-04-23 (×5): qty 1

## 2015-04-23 MED ORDER — ALPRAZOLAM 0.5 MG PO TABS
0.5000 mg | ORAL_TABLET | Freq: Two times a day (BID) | ORAL | Status: DC | PRN
  Administered 2015-04-24: 0.5 mg via ORAL
  Filled 2015-04-23: qty 1

## 2015-04-23 MED ORDER — POLYETHYLENE GLYCOL 3350 17 G PO PACK: 17.0000 g | PACK | Freq: Every day | ORAL | Status: DC | PRN

## 2015-04-23 MED ORDER — METOPROLOL TARTRATE 50 MG PO TABS
50.0000 mg | ORAL_TABLET | Freq: Two times a day (BID) | ORAL | Status: DC
  Administered 2015-04-23 – 2015-04-24 (×2): 50 mg via ORAL
  Filled 2015-04-23 (×3): qty 1

## 2015-04-23 MED ORDER — PANTOPRAZOLE SODIUM 40 MG PO TBEC
40.0000 mg | DELAYED_RELEASE_TABLET | Freq: Every day | ORAL | Status: DC
  Administered 2015-04-24 – 2015-04-25 (×2): 40 mg via ORAL
  Filled 2015-04-23 (×2): qty 1

## 2015-04-23 MED ORDER — ISOSORBIDE MONONITRATE ER 30 MG PO TB24
30.0000 mg | ORAL_TABLET | Freq: Every day | ORAL | Status: DC
  Administered 2015-04-24 – 2015-04-25 (×2): 30 mg via ORAL
  Filled 2015-04-23 (×2): qty 1

## 2015-04-23 MED ORDER — VITAMIN D3 25 MCG (1000 UNIT) PO TABS
2000.0000 [IU] | ORAL_TABLET | Freq: Two times a day (BID) | ORAL | Status: DC
  Administered 2015-04-23 – 2015-04-25 (×4): 2000 [IU] via ORAL
  Filled 2015-04-23 (×9): qty 2

## 2015-04-23 MED ORDER — DIPHENHYDRAMINE HCL 25 MG PO CAPS: 50.0000 mg | ORAL_CAPSULE | Freq: Two times a day (BID) | ORAL | Status: DC | PRN

## 2015-04-23 MED ORDER — HYDROCODONE-ACETAMINOPHEN 5-325 MG PO TABS
1.0000 | ORAL_TABLET | ORAL | Status: DC | PRN
  Administered 2015-04-23 – 2015-04-24 (×2): 2 via ORAL
  Administered 2015-04-24: 1 via ORAL
  Administered 2015-04-24 – 2015-04-25 (×2): 2 via ORAL
  Filled 2015-04-23 (×3): qty 2
  Filled 2015-04-23: qty 1
  Filled 2015-04-23: qty 2

## 2015-04-23 MED ORDER — VITAMIN B-12 1000 MCG PO TABS
2000.0000 ug | ORAL_TABLET | Freq: Two times a day (BID) | ORAL | Status: DC
  Administered 2015-04-23 – 2015-04-25 (×4): 2000 ug via ORAL
  Filled 2015-04-23 (×4): qty 2

## 2015-04-23 MED ORDER — ACETAMINOPHEN 500 MG PO TABS
500.0000 mg | ORAL_TABLET | Freq: Four times a day (QID) | ORAL | Status: DC | PRN
Start: 2015-04-23 — End: 2015-04-25

## 2015-04-23 MED ORDER — TRAZODONE HCL 50 MG PO TABS
50.0000 mg | ORAL_TABLET | Freq: Every day | ORAL | Status: DC
  Administered 2015-04-23 – 2015-04-24 (×2): 50 mg via ORAL
  Filled 2015-04-23 (×2): qty 1

## 2015-04-23 MED ORDER — HYDROCODONE-ACETAMINOPHEN 5-325 MG PO TABS
1.0000 | ORAL_TABLET | Freq: Once | ORAL | Status: AC
  Administered 2015-04-23: 1 via ORAL
  Filled 2015-04-23: qty 1

## 2015-04-23 MED ORDER — CEPHALEXIN 500 MG PO CAPS
500.0000 mg | ORAL_CAPSULE | Freq: Four times a day (QID) | ORAL | Status: DC
  Administered 2015-04-24 – 2015-04-25 (×6): 500 mg via ORAL
  Filled 2015-04-23 (×6): qty 1

## 2015-04-23 MED ORDER — ASPIRIN EC 81 MG PO TBEC
81.0000 mg | DELAYED_RELEASE_TABLET | Freq: Every day | ORAL | Status: DC
  Administered 2015-04-24 – 2015-04-25 (×2): 81 mg via ORAL
  Filled 2015-04-23 (×2): qty 1

## 2015-04-23 MED ORDER — TRIAMCINOLONE ACETONIDE 0.1 % EX CREA
1.0000 "application " | TOPICAL_CREAM | Freq: Two times a day (BID) | CUTANEOUS | Status: DC | PRN
  Filled 2015-04-23: qty 15

## 2015-04-23 MED ORDER — ARTIFICIAL TEARS OP OINT
TOPICAL_OINTMENT | Freq: Three times a day (TID) | OPHTHALMIC | Status: DC | PRN
  Filled 2015-04-23: qty 3.5

## 2015-04-23 MED ORDER — ENOXAPARIN SODIUM 30 MG/0.3ML ~~LOC~~ SOLN
30.0000 mg | SUBCUTANEOUS | Status: DC
  Administered 2015-04-24 – 2015-04-25 (×2): 30 mg via SUBCUTANEOUS
  Filled 2015-04-23 (×2): qty 0.3

## 2015-04-23 MED ORDER — OMEGA-3-ACID ETHYL ESTERS 1 G PO CAPS
2.0000 g | ORAL_CAPSULE | Freq: Every day | ORAL | Status: DC
  Administered 2015-04-24 – 2015-04-25 (×2): 2 g via ORAL
  Filled 2015-04-23 (×2): qty 2

## 2015-04-23 MED ORDER — SIMVASTATIN 20 MG PO TABS
20.0000 mg | ORAL_TABLET | Freq: Every day | ORAL | Status: DC
  Administered 2015-04-23 – 2015-04-24 (×2): 20 mg via ORAL
  Filled 2015-04-23 (×2): qty 1

## 2015-04-23 MED ORDER — FENTANYL CITRATE (PF) 100 MCG/2ML IJ SOLN
50.0000 ug | Freq: Once | INTRAMUSCULAR | Status: AC
  Administered 2015-04-23: 50 ug via INTRAVENOUS
  Filled 2015-04-23: qty 2

## 2015-04-23 MED ORDER — AMLODIPINE BESYLATE 2.5 MG PO TABS
2.5000 mg | ORAL_TABLET | Freq: Every day | ORAL | Status: DC
  Administered 2015-04-24 – 2015-04-25 (×2): 2.5 mg via ORAL
  Filled 2015-04-23 (×2): qty 1

## 2015-04-23 MED ORDER — NITROGLYCERIN 0.4 MG SL SUBL: 0.4000 mg | SUBLINGUAL_TABLET | SUBLINGUAL | Status: DC | PRN

## 2015-04-23 MED ORDER — DICLOFENAC SODIUM 1 % TD GEL
2.0000 g | Freq: Three times a day (TID) | TRANSDERMAL | Status: DC | PRN
  Filled 2015-04-23: qty 100

## 2015-04-23 MED ORDER — METHYLPREDNISOLONE SODIUM SUCC 125 MG IJ SOLR
60.0000 mg | Freq: Once | INTRAMUSCULAR | Status: AC
  Administered 2015-04-23: 60 mg via INTRAVENOUS
  Filled 2015-04-23: qty 2

## 2015-04-23 MED ORDER — SODIUM CHLORIDE 0.9 % IV BOLUS (SEPSIS)
500.0000 mL | Freq: Once | INTRAVENOUS | Status: AC
  Administered 2015-04-23: 500 mL via INTRAVENOUS

## 2015-04-23 NOTE — ED Notes (Signed)
Pt is aware of urine sample. Info me she have not used the restroom since last ight

## 2015-04-23 NOTE — ED Notes (Signed)
Bed: WA17 Expected date:  Expected time:  Means of arrival:  Comments: EMS- 81yo F, neck pain

## 2015-04-23 NOTE — Telephone Encounter (Signed)
Pt is at ED at [facility].   Drs Lynnae Prude know what has caused the pain and swelling in her neck.  Would like to talk to dr hensel about what might be going on. Would like to talk to dr Andria Frames today

## 2015-04-23 NOTE — ED Notes (Signed)
[facility] notified for transport 

## 2015-04-23 NOTE — ED Notes (Signed)
Patient transported to X-ray 

## 2015-04-23 NOTE — ED Notes (Signed)
MD at bedside. 

## 2015-04-23 NOTE — ED Notes (Signed)
Patient transported to MRI 

## 2015-04-23 NOTE — ED Notes (Signed)
Attempted to call report - RN to return call.  

## 2015-04-23 NOTE — ED Provider Notes (Signed)
CSN: [id number]     Arrival date & time 04/23/15  [id number] History   First MD Initiated Contact with Patient 04/23/15 1105     Chief Complaint  Patient presents with  . Back Pain    Neck/ total body pain, no recent trauma.       Patient is a 81 y.o. female presenting with back pain. The history is provided by the patient. No language interpreter was used.  Back Pain  Jacqueline [name] presents for evaluation of pain in her neck and her thumbs. She reports 3 days of a crick in her neck that started on the right side and now is diffuse neck pain that radiates to her head and throughout her body. She also has pain all over her body. She reports bilateral thumb pain since yesterday with swelling noted today. She denies any fevers, vomiting. She has a history of infected knee joint, atrial fibrillation, coronary artery disease status post CABG. She has a history of osteoarthritis but no history of autoimmune disease.  Past Medical History  Diagnosis Date  . Hypertension   . CAD (coronary artery disease)     a. s/p CABG x 2 (LIMA->LAD, VG->Diag);  b. 08/2014 Cath: LM nl, LAD 90p, LCX nl, RCA nl/dominant, LIMA->LAD atretic, VG->D2 patent w/ retrograde filling of LAD, EF 55-60%-->Med Rx.  Marland Kitchen Hyperlipemia   . Insomnia   . Diverticulitis 02/21/2012  . GERD (gastroesophageal reflux disease)   . Atrial fibrillation with rapid ventricular response 02/2014    a. CHA2DS2VASc = 6 -> on eliquis;  b. 02/2014 s/p DCCV;  c. 08/2014 Echo: EF 55-60%, Gr 2 DD, mild MR, triv AI.  Marland Kitchen Arthritis     back   Past Surgical History  Procedure Laterality Date  . Coronary artery bypass graft  2006    off-pump bypass surgery with LIMA to the LAD and SVG to the second diagonal artery ( performed by Dr Servando Snare)   . Tee without cardioversion N/A 02/28/2014    Procedure: TRANSESOPHAGEAL ECHOCARDIOGRAM (TEE);  Surgeon: Sanda Klein, MD;  Location: [facility];  Service: Cardiovascular;  Laterality: N/A;  . Cardioversion N/A 02/28/2014    Procedure: CARDIOVERSION;  Surgeon: Sanda Klein, MD;  Location: [facility];  Service: Cardiovascular;  Laterality: N/A;  . Cataract extraction Bilateral 2014    with lens implanted  . Abdominal hysterectomy  1975  . Rotator cuff repair Bilateral     r-1999, l- 2005  . Joint replacement Bilateral     L-2004, R-2006  . Total knee arthroplasty Right 07/02/2014    Procedure: Resection of Infected Right Total Knee Arthroplasty with placement antibiotic spacer;  Surgeon: Mauri Pole, MD;  Location: [facility];  Service: Orthopedics;  Laterality: Right;  . Left heart catheterization with coronary/graft angiogram N/A 09/17/2014    Procedure: LEFT HEART CATHETERIZATION WITH Beatrix Fetters;  Surgeon: Troy Sine, MD;  Location: [facility] CATH LAB;  Service: Cardiovascular;  Laterality: N/A;  . Reimplantation of total knee Right 10/08/2014    Procedure: REIMPLANTATION OF RIGHT TOTAL KNEE ARTHROPLASTY WITH REMOVAL OF ANTIBIOTIC SPACER;  Surgeon: Paralee Cancel, MD;  Location: [facility];  Service: Orthopedics;  Laterality: Right;  . Colonoscopy N/A 02/19/2015    Procedure: COLONOSCOPY;  Surgeon: Ladene Artist, MD;  Location: [facility] ENDOSCOPY;  Service: Endoscopy;  Laterality: N/A;   Family History  Problem Relation Age of Onset  . Parkinson's disease Mother    Social History  Substance Use Topics  . Smoking status: Never Smoker   .  Smokeless tobacco: Never Used  . Alcohol Use: No   OB History    No data available     Review of Systems  Musculoskeletal: Positive for back pain.  All other systems reviewed and are negative.     Allergies  Atorvastatin; Crestor; and Morphine and related  Home Medications   Prior to Admission medications   Medication Sig Start Date End Date Taking? Authorizing Provider  acetaminophen (TYLENOL) 500 MG tablet Take 500-1,000 mg by mouth every 6 (six) hours as needed for moderate pain or headache.    Historical Provider, MD  alendronate (FOSAMAX) 70 MG tablet  Take 1 tablet (70 mg total) by mouth once a week. Take with a full glass of water on an empty stomach, remain upright 03/27/15   [name], MD  ALPRAZolam Duanne Moron) 0.5 MG tablet Take 1 tablet (0.5 mg total) by mouth 2 (two) times daily as needed for anxiety. 11/19/14   [name], MD  aspirin EC 81 MG tablet Take 81 mg by mouth daily.    Historical Provider, MD  cephALEXin (KEFLEX) 500 MG capsule Take 1 capsule (500 mg total) by mouth 4 (four) times daily. 04/15/15 04/24/15  Mariel Aloe, MD  Cholecalciferol (VITAMIN D-3) 1000 UNITS CAPS Take 2,000 Units by mouth 2 (two) times daily.    Historical Provider, MD  diphenhydrAMINE (BENADRYL) 25 MG tablet Take 50 mg by mouth 2 (two) times daily as needed for allergies (eye allergies).     Historical Provider, MD  furosemide (LASIX) 20 MG tablet Take 1 tablet (20 mg total) by mouth daily. Patient taking differently: Take 20 mg by mouth daily as needed for fluid.  01/30/15   Mihai Croitoru, MD  hydrocortisone (ANUSOL-HC) 25 MG suppository Place 1 suppository (25 mg total) rectally 2 (two) times daily. 02/19/15   Mercy Riding, MD  Hypromellose (ARTIFICIAL TEARS OP) Place 1 drop into both eyes 3 (three) times daily as needed (dry eyes).    Historical Provider, MD  ketoconazole (NIZORAL) 2 % cream Apply 1 application topically daily. 03/14/15   [name], MD  loperamide (IMODIUM) 2 MG capsule Take 1 capsule (2 mg total) by mouth as needed for diarrhea or loose stools. 07/05/14   Danae Orleans, PA-C  metoprolol (LOPRESSOR) 50 MG tablet Take 1 tablet (50 mg total) by mouth 2 (two) times daily. 11/14/14   [name], MD  nitroGLYCERIN (NITROSTAT) 0.4 MG SL tablet Place 1 tablet (0.4 mg total) under the tongue every 5 (five) minutes as needed for chest pain. 03/21/14   Mihai Croitoru, MD  nystatin (MYCOSTATIN/NYSTOP) 100000 UNIT/GM POWD apply to area TOPICALLY 3 times daily until healing complete 03/18/15   Patrecia Pour, MD  Omega-3 Fatty Acids  (FISH OIL) 1000 MG CAPS Take 1-2 capsules by mouth 2 (two) times daily. Takes one in the morning and two capsules at night.    Historical Provider, MD  omeprazole (PRILOSEC) 20 MG capsule Take 1 capsule (20 mg total) by mouth daily. 03/01/15   [name], MD  polyethylene glycol (MIRALAX / GLYCOLAX) packet Take 17 g by mouth 2 (two) times daily. 10/11/14   Danae Orleans, PA-C  rOPINIRole (REQUIP) 0.25 MG tablet Take 1 tablet (0.25 mg total) by mouth 3 (three) times daily. 01/03/15   [name], MD  simvastatin (ZOCOR) 40 MG tablet Take 0.5 tablets (20 mg total) by mouth at bedtime. 01/03/15   [name], MD  traMADol (ULTRAM) 50 MG  tablet TAKE 1 TABLET BY MOUTH TWICE A DAY Patient taking differently: TAKE 1 TABLET BY MOUTH AT BEDTIME AS NEEDED FOR PAIN 01/03/15   [name], MD  traZODone (DESYREL) 50 MG tablet Take 1 tablet (50 mg total) by mouth at bedtime. 01/03/15   [name], MD  triamcinolone cream (KENALOG) 0.1 % Apply 1 application topically 2 (two) times daily as needed. 03/27/15   [name], MD  vitamin B-12 (CYANOCOBALAMIN) 100 MCG tablet Take 2,000 mcg by mouth 2 (two) times daily.    Historical Provider, MD   BP 139/56 mmHg  Pulse 56  Temp(Src) 98.4 F (36.9 C) (Oral)  Resp 16  Ht 5\' 1"  (1.549 m)  Wt 130 lb (58.968 kg)  BMI 24.58 kg/m2  SpO2 98% Physical Exam  Constitutional: She is oriented to person, place, and time. She appears well-developed and well-nourished.  HENT:  Head: Normocephalic and atraumatic.  Neck:  Diffuse tenderness to palpation over posterior neck with slightly decreased range of motion secondary to pain.  Cardiovascular: Normal rate and regular rhythm.   No murmur heard. Pulmonary/Chest: Effort normal and breath sounds normal. No respiratory distress.  Abdominal: Soft. There is no tenderness. There is no rebound and no guarding.  Musculoskeletal:  Decreased range of motion in bilateral shoulders, hands. Moderate erythema,  edema, joint tenderness over bilateral wrists as well as first IP joint bilaterally. Mild tenderness over bilateral knees with decreased range of motion.  Neurological: She is alert and oriented to person, place, and time.  Skin: Skin is warm and dry.  Psychiatric: She has a normal mood and affect. Her behavior is normal.  Nursing note and vitals reviewed.   ED Course  Procedures (including critical care time) Labs Review Labs Reviewed  COMPREHENSIVE METABOLIC PANEL  CBC WITH DIFFERENTIAL/PLATELET  URINALYSIS, ROUTINE W REFLEX MICROSCOPIC (NOT AT [facility])  SEDIMENTATION RATE  C-REACTIVE PROTEIN    Imaging Review No results found. I have personally reviewed and evaluated these images and lab results as part of my medical decision-making.   EKG Interpretation None      MDM   Final diagnoses:  Neck pain  Arthralgia    Pt here for evaluation of neck pain, arthralgias.  Examination with generalized weakness, multiple joints with erythema, edema, decreased ROM.  Hx and presentation concerning for polyarthralgia/autoimmune process.  Presentation is less c/w septic arthritis, endocarditis.  She was given steroids, pain meds in the ED with minimal improvement, unable to ambulate/sit up without assistance.  Discussed with family medicine service regarding admission for further work up/management.     Quintella Reichert, MD 04/24/15 [id number]

## 2015-04-23 NOTE — ED Notes (Signed)
Per the patient she has had "a crick in her neck" x1 week, this became worse yesterday, and today she woke up at 2:30am today with total body pain. She presents with bilateral hands injected, swollen and tender at bilateral base of thumbs.

## 2015-04-23 NOTE — Telephone Encounter (Signed)
Called and spoke to [place] daughter.  Marked pain in neck and bilateral hands.  Reviewed labs.  She will be admitted to our [facility].  Without seeing patient I lean toward giant cell arteritis or polymyalgia rheumatica.  Big differential is inflammatory versus cancer versus infection.  Likely needs cultures.  Discussed with inpatient team.  I lean toward early treatment with steroids.  We will see how this evolves.  She is not a complainer

## 2015-04-23 NOTE — ED Notes (Signed)
Pt walked with unsteady gait and much difficulty to rise

## 2015-04-24 DIAGNOSIS — I5032 Chronic diastolic (congestive) heart failure: Secondary | ICD-10-CM | POA: Diagnosis not present

## 2015-04-24 DIAGNOSIS — M2550 Pain in unspecified joint: Secondary | ICD-10-CM | POA: Diagnosis not present

## 2015-04-24 DIAGNOSIS — I4891 Unspecified atrial fibrillation: Secondary | ICD-10-CM

## 2015-04-24 DIAGNOSIS — N182 Chronic kidney disease, stage 2 (mild): Secondary | ICD-10-CM

## 2015-04-24 DIAGNOSIS — M542 Cervicalgia: Secondary | ICD-10-CM | POA: Insufficient documentation

## 2015-04-24 DIAGNOSIS — I480 Paroxysmal atrial fibrillation: Secondary | ICD-10-CM

## 2015-04-24 LAB — CBC
HCT: 30.6 % — ABNORMAL LOW (ref 36.0–46.0)
HEMOGLOBIN: 10.2 g/dL — AB (ref 12.0–15.0)
MCH: 30.8 pg (ref 26.0–34.0)
MCHC: 33.3 g/dL (ref 30.0–36.0)
MCV: 92.4 fL (ref 78.0–100.0)
PLATELETS: 182 10*3/uL (ref 150–400)
RBC: 3.31 MIL/uL — AB (ref 3.87–5.11)
RDW: 14 % (ref 11.5–15.5)
WBC: 10.2 10*3/uL (ref 4.0–10.5)

## 2015-04-24 LAB — BASIC METABOLIC PANEL
Anion gap: 11 (ref 5–15)
BUN: 23 mg/dL — AB (ref 6–20)
CHLORIDE: 103 mmol/L (ref 101–111)
CO2: 21 mmol/L — ABNORMAL LOW (ref 22–32)
Calcium: 9 mg/dL (ref 8.9–10.3)
Creatinine, Ser: 1.52 mg/dL — ABNORMAL HIGH (ref 0.44–1.00)
GFR calc Af Amer: 36 mL/min — ABNORMAL LOW (ref >60)
GFR, EST NON AFRICAN AMERICAN: 31 mL/min — AB (ref >60)
GLUCOSE: 141 mg/dL — AB (ref 65–99)
POTASSIUM: 4 mmol/L (ref 3.5–5.1)
SODIUM: 135 mmol/L (ref 135–145)

## 2015-04-24 LAB — URIC ACID: URIC ACID, SERUM: 7.8 mg/dL — AB (ref 2.3–6.6)

## 2015-04-24 MED ORDER — PREDNISONE 20 MG PO TABS
40.0000 mg | ORAL_TABLET | Freq: Every day | ORAL | Status: DC
Start: 2015-04-24 — End: 2015-04-25
  Administered 2015-04-24 – 2015-04-25 (×2): 40 mg via ORAL
  Filled 2015-04-24 (×2): qty 2

## 2015-04-24 NOTE — H&P (Signed)
[facility] Admission History and Physical Service Pager: [phone]  Patient name: Jacqueline [name] Medical record number: [id number] Date of birth: Aug 10, 1930 Age: 81 y.o. Gender: female  Primary Care Provider: Zigmund Gottron, MD Consultants: None Code Status: Full  Chief Complaint: Thumb and neck pain  Assessment and Plan: TU SHIMMEL is a 81 y.o. female presenting with polyarthralgias and elevated markers of inflammation. PMH is significant for severe OA s/p bilateral knee and shoulder replacements, septic right TKA s/p revision Feb 2016, CAD s/p CABG 2006, and atrial fibrillation.   Polyarthralgias: inflammatory and symmetric in setting of significant elevation of inflammatory markers and absence of apparent infection, suggestive of polymyalgia rheumatica without clinical features of giant cell arteritis by history or exam. Not consistent with osteoarthritis alone, though she is severely affected by this as well. - Cultures drawn prior to transfer, will follow to definitively rule out infectious etiology. Expect demargination leukocytosis with steroids, not an indication of infection.  - Steroids started, will monitor response to what will serve as diagnosis-confirming therapeutic trial.  - Rule out rheumatoid arthritis with RF. Consider additional rheum panel elements (ANA, urate, anti-CCP) in the future/based on labs.  - No indication for temporal artery biopsy, consider this if acute phase reactants remain elevated or systemic symptoms appear despite steroids.  - Will need PT/OT evaluation prior to discharge. Recommend assessment after pain improves.  - Voltaren, norco for pain control, holding home tramadol as this was ineffective. Holding tizanidine and would discontinue this on discharge - she doesn't recall this medication.   CAD s/p CABG 2006, HTN, and diastolic dysfunction noted on echo in Feb 2016 (EF 55-60%, G2DD):  - Holding prn lasix, no  recent need for this - Continue low dose norvasc, imdur, NTG prn - Continue simvastatin; noted allergies to lipitor, crestor. Presentation not consistent with myalgia/myopathy.  - Daily weight, I/O  Atrial fibrillation: NSR on ECGs dating back at least 12 months.  - Continue metoprolol for rate control, aspirin for anticoagulation (CHADSVasc = 6, formerly on eliquis, switched to ASA after recent diverticulosis)  Axillary furuncles: Appear improved compared with images from recent office visit.  - Finish course of keflex (last dose 9/29)  CKD III-IV: Cr. 1.62 = eCrCl ~ 45m/min. Worsening baseline evident on labs since March 2016 (Cr 1.05) without suspicion for prerenal cause.  - Monitor Cr, UOP and consider FENa - Avoid nephrotoxic medications  Osteoporosis: No recent falls - Continue Cholecalciferol BID, due for fosamax 10/1 but do not suspect she will still be hospitalized. - Continue PPI in hospital, consider risks and benefits as outpatient.   Anxiety: Likely an element of adjustment disorder also playing a role in recent fatigue since brother's death.  - Continue low dose xanax 0.529mBID prn, counseled on the dangers of this medication in elderly patients. Defer further discussions of risks and benefits to PCP. - Continue trazodone 5053mHS  Restless legs:  FEN/GI: Saline lock IV, regular diet Prophylaxis: Subcutaneous heparin  Disposition: Admit for further work up and empiric treatment.   History of Present Illness:  Jacqueline [name] a 84 109o. female presenting with joint pain in her neck and hands.   Accompanied by her daughter, they report that Ms. Giusto woke up 9/26 with significant symmetrical thumb pain, stiffness, weakness and a "crick" (pain and trouble turning her head to the side) in her neck. 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 This pain worsened over the course of the day  despite applying voltaren and taking tramadol as directed, making it difficult to hold objects in either hand. Stiffness not appreciably better/worse in morning vs. later in the day. She has had trouble dressing herself, and recently worsening bilateral knee pain and stiffness has made walking very difficult. She could ambulate with a walker but hand pain limits this now.   These symptoms caused her to seek emergent care and EMS took her to [facility] ED because the complaint "wasn't her heart." Work up was significant for inflammatory marker elevation without signs of infection and she was transferred to [facility] for admission. Her pain seems to have improved somewhat with vicodin x1 and steroids x1.   She denies fevers, chills, night sweats, weight loss, change in appetite, headache, scalp tenderness, vision or hearing changes, dizziness, syncope, chest pain, dyspnea, palpitations, cough, abdominal pain, N/V, changes in bowel or bladder habits, rashes. No family or personal history of autoimmune disorders.   She has felt "off" over the past 2 weeks with symptoms including fatigue and trouble sleeping since the passing of her brother. She spent a lot of time in the hospital with him. He suffered from pneumonia and CDiff colitis, though she reports no cough or diarrhea, as above.   Review Of Systems: Per HPI Otherwise 12 point review of systems was performed and was unremarkable.  Patient Active Problem List   Diagnosis Date Noted  . Polyarthralgia 04/23/2015  . Screening for breast cancer 03/27/2015  . Encounter for preventive health examination 03/27/2015  . Eczematous eruption 03/21/2015  . Pruritic intertrigo 03/18/2015  . Hematochezia   . Diverticulosis of large intestine without perforation or abscess with bleeding   . GI bleeding 02/17/2015  . Acute GI bleeding   . Dyspnea   . Acute on chronic diastolic heart failure   . Essential hypertension   . Atrial fibrillation, unspecified   . CKD  (chronic kidney disease)   . Fatigue 11/15/2014  . Diarrhea 11/14/2014  . Anemia due to chronic blood loss 10/10/2014  . Overweight (BMI 25.0-29.9) 10/10/2014  . S/P knee replacement 10/08/2014  . PAF (paroxysmal atrial fibrillation) 09/24/2014  . Abdominal cramping 08/10/2014  . Rash 08/10/2014  . S/P right TKA removal and spacer placement 07/02/2014  . Osteoporosis 03/22/2014  . GERD (gastroesophageal reflux disease)   . Chronic kidney disease, stage 2, mildly decreased GFR 12/22/2013  . Abnormal thyroid blood test 12/22/2013  . CHF NYHA class II (symptoms with moderately strenuous activities) 07/22/2011  . HYPERCHOLESTEROLEMIA 09/23/2006  . Anemia 09/23/2006  . HYPERTENSION, BENIGN SYSTEMIC 09/23/2006  . Coronary atherosclerosis 09/23/2006  . RHINITIS, ALLERGIC 09/23/2006  . REFLUX ESOPHAGITIS 09/23/2006  . DIVERTICULOSIS OF COLON 09/23/2006  . OSTEOARTHRITIS, MULTI SITES 09/23/2006  . BACK PAIN, LOW 09/23/2006  . INSOMNIA NOS 09/23/2006   Past Medical History: Past Medical History  Diagnosis Date  . Hypertension   . CAD (coronary artery disease)     a. s/p CABG x 2 (LIMA->LAD, VG->Diag);  b. 08/2014 Cath: LM nl, LAD 90p, LCX nl, RCA nl/dominant, LIMA->LAD atretic, VG->D2 patent w/ retrograde filling of LAD, EF 55-60%-->Med Rx.  Marland Kitchen Hyperlipemia   . Insomnia   . Diverticulitis 02/21/2012  . GERD (gastroesophageal reflux disease)   . Atrial fibrillation with rapid ventricular response 02/2014    a. CHA2DS2VASc = 6 -> on eliquis;  b. 02/2014 s/p DCCV;  c. 08/2014 Echo: EF 55-60%,  Gr 2 DD, mild MR, triv AI.  Marland Kitchen Arthritis     back   Past Surgical History: Past Surgical History  Procedure Laterality Date  . Coronary artery bypass graft  2006    off-pump bypass surgery with LIMA to the LAD and SVG to the second diagonal artery ( performed by Dr Servando Snare)   . Tee without cardioversion N/A 02/28/2014    Procedure: TRANSESOPHAGEAL ECHOCARDIOGRAM (TEE);  Surgeon: Sanda Klein, MD;   Location: [facility];  Service: Cardiovascular;  Laterality: N/A;  . Cardioversion N/A 02/28/2014    Procedure: CARDIOVERSION;  Surgeon: Sanda Klein, MD;  Location: [facility] ENDOSCOPY;  Service: Cardiovascular;  Laterality: N/A;  . Cataract extraction Bilateral 2014    with lens implanted  . Abdominal hysterectomy  1975  . Rotator cuff repair Bilateral     r-1999, l- 2005  . Joint replacement Bilateral     L-2004, R-2006  . Total knee arthroplasty Right 07/02/2014    Procedure: Resection of Infected Right Total Knee Arthroplasty with placement antibiotic spacer;  Surgeon: Mauri Pole, MD;  Location: [facility] ORS;  Service: Orthopedics;  Laterality: Right;  . Left heart catheterization with coronary/graft angiogram N/A 09/17/2014    Procedure: LEFT HEART CATHETERIZATION WITH Beatrix Fetters;  Surgeon: Troy Sine, MD;  Location: [facility] CATH LAB;  Service: Cardiovascular;  Laterality: N/A;  . Reimplantation of total knee Right 10/08/2014    Procedure: REIMPLANTATION OF RIGHT TOTAL KNEE ARTHROPLASTY WITH REMOVAL OF ANTIBIOTIC SPACER;  Surgeon: Paralee Cancel, MD;  Location: [facility] ORS;  Service: Orthopedics;  Laterality: Right;  . Colonoscopy N/A 02/19/2015    Procedure: COLONOSCOPY;  Surgeon: Ladene Artist, MD;  Location: [facility] ENDOSCOPY;  Service: Endoscopy;  Laterality: N/A;   Social History: Social History  Substance Use Topics  . Smoking status: Never Smoker   . Smokeless tobacco: Never Used  . Alcohol Use: No   Additional social history: Lives alone. Does not smoke or drink. Supported by family including daughter, Theadora Rama. Brother recently died after prolonged hospital course with pneumonia and C Diff colitis.  Please also refer to relevant sections of EMR.  Family History: Family History  Problem Relation Age of Onset  . Parkinson's disease Mother    Allergies and Medications: Allergies  Allergen Reactions  . Atorvastatin Other (See Comments)    Myalgias in legs  . Crestor  [Rosuvastatin] Other (See Comments)    Myalgias in legs  . Morphine And Related Other (See Comments)    "Crazy thoughts, severe headache, sick"   No current facility-administered medications on file prior to encounter.   Current Outpatient Prescriptions on File Prior to Encounter  Medication Sig Dispense Refill  . acetaminophen (TYLENOL) 500 MG tablet Take 500-1,000 mg by mouth every 6 (six) hours as needed for moderate pain or headache.    . alendronate (FOSAMAX) 70 MG tablet Take 1 tablet (70 mg total) by mouth once a week. Take with a full glass of water on an empty stomach, remain upright (Patient taking differently: Take 70 mg by mouth once a week. Take with a full glass of water on an empty stomach, remain upright  Saturday) 12 tablet 3  . ALPRAZolam (XANAX) 0.5 MG tablet Take 1 tablet (0.5 mg total) by mouth 2 (two) times daily as needed for anxiety. 60 tablet 5  . aspirin EC 81 MG tablet Take 81 mg by mouth daily.    . cephALEXin (KEFLEX) 500 MG capsule Take 1 capsule (500 mg total) by mouth 4 (four)  times daily. (Patient taking differently: Take 500 mg by mouth 4 (four) times daily. For 10 days 9-19 to 9-29) 40 capsule 0  . Cholecalciferol (VITAMIN D-3) 1000 UNITS CAPS Take 2,000 Units by mouth 2 (two) times daily.    . diphenhydrAMINE (BENADRYL) 25 MG tablet Take 50 mg by mouth 2 (two) times daily as needed for allergies (eye allergies).     . furosemide (LASIX) 20 MG tablet Take 1 tablet (20 mg total) by mouth daily. (Patient taking differently: Take 20 mg by mouth daily as needed for fluid. ) 90 tablet 3  . Hypromellose (ARTIFICIAL TEARS OP) Place 1 drop into both eyes 3 (three) times daily as needed (dry eyes).    Marland Kitchen loperamide (IMODIUM) 2 MG capsule Take 1 capsule (2 mg total) by mouth as needed for diarrhea or loose stools. 30 capsule 0  . metoprolol (LOPRESSOR) 50 MG tablet Take 1 tablet (50 mg total) by mouth 2 (two) times daily. 180 tablet 3  . nitroGLYCERIN (NITROSTAT) 0.4 MG  SL tablet Place 1 tablet (0.4 mg total) under the tongue every 5 (five) minutes as needed for chest pain. 25 tablet 2  . Omega-3 Fatty Acids (FISH OIL) 1000 MG CAPS Take 1-2 capsules by mouth 2 (two) times daily. Takes one in the morning and two capsules at night.    Marland Kitchen omeprazole (PRILOSEC) 20 MG capsule Take 1 capsule (20 mg total) by mouth daily. 90 capsule 3  . polyethylene glycol (MIRALAX / GLYCOLAX) packet Take 17 g by mouth 2 (two) times daily. (Patient taking differently: Take 17 g by mouth daily as needed for moderate constipation. ) 14 each 0  . rOPINIRole (REQUIP) 0.25 MG tablet Take 1 tablet (0.25 mg total) by mouth 3 (three) times daily. 270 tablet 3  . simvastatin (ZOCOR) 40 MG tablet Take 0.5 tablets (20 mg total) by mouth at bedtime. 45 tablet 3  . traMADol (ULTRAM) 50 MG tablet TAKE 1 TABLET BY MOUTH TWICE A DAY (Patient taking differently: TAKE 1 TABLET BY MOUTH TWICE A DAY PRN PAIN) 60 tablet 5  . traZODone (DESYREL) 50 MG tablet Take 1 tablet (50 mg total) by mouth at bedtime. 90 tablet 3  . triamcinolone cream (KENALOG) 0.1 % Apply 1 application topically 2 (two) times daily as needed. (Patient taking differently: Apply 1 application topically 2 (two) times daily as needed (knee). ) 45 g 3  . vitamin B-12 (CYANOCOBALAMIN) 100 MCG tablet Take 2,000 mcg by mouth 2 (two) times daily.    . hydrocortisone (ANUSOL-HC) 25 MG suppository Place 1 suppository (25 mg total) rectally 2 (two) times daily. (Patient not taking: Reported on 04/23/2015) 12 suppository 0  . ketoconazole (NIZORAL) 2 % cream Apply 1 application topically daily. (Patient not taking: Reported on 04/23/2015) 30 g 1  . nystatin (MYCOSTATIN/NYSTOP) 100000 UNIT/GM POWD apply to area TOPICALLY 3 times daily until healing complete (Patient not taking: Reported on 04/23/2015) 30 g 0    Objective: BP 133/62 mmHg  Pulse 60  Temp(Src) 98.5 F (36.9 C) (Oral)  Resp 18  Ht _0  (1.549 m)  Wt 145 lb 1 oz (65.8 kg)  BMI 27.42  kg/m2  SpO2 97% Exam: General: Pleasant elderly female laying in bed in no distress Head: Normocephalic, atraumatic, temples without varicosities or tenderness bilaterally. No scalp tenderness.  Eyes: PERRL, EOMI ENTM: oropharynx clear, MMM Neck: Supple without lymphadenopathy Cardiovascular: Regular rate without murmur, rub or gallop. No JVD or LE edema.  Respiratory: Nonlabored, clear Abdomen: +  BS, soft, NT, ND, full nontender bladder otherwise no abnormalities MSK: Tenderness and warmth to palpation on bilateral knees, wrists, thumb MCPs, shoulders. Strength intact throughout all joints and in all proximal muscle groups. Neck ROM limited by pain but not rigid. No paraspinal spasm. Shoulder abduction limited to < 90 deg AROM with ~120 deg B/L PROM. Muscles all nontender.  Skin: Resolving left axillary folliculitis Neuro: Alert, oriented, nonfocal, speech normal Psych: Depressed mood, broad affect. No SI.   Labs and Imaging: CBC BMET   Recent Labs Lab 04/23/15 2245  WBC 7.6  HGB 9.8*  HCT 29.7*  PLT 153    Recent Labs Lab 04/23/15 1151 04/23/15 2245  NA 137  --   K 3.7  --   CL 105  --   CO2 24  --   BUN 27*  --   CREATININE 1.51* 1.62*  GLUCOSE 89  --   CALCIUM 8.7*  --      CRP: 24.7 ESR: 97 Blood culture 9/27 Urine culture 9/27 CXR 2v: Interval resolution of interstitial edema and pleural effusions. No acute cardiopulmonary process. MRI C-Spine: Cervical spondylosis. Mild spinal stenosis and mild foraminal stenosis throughout the cervical spine as described above. No acute disc protrusion.  Patrecia Pour, MD 04/24/2015, 12:36 AM PGY-3, [place] [facility] pager: [phone], text pages welcome

## 2015-04-24 NOTE — Evaluation (Signed)
Physical Therapy Evaluation Patient Details Name: Jacqueline [name] MRN: [id number] DOB: 1931-04-21 Today's Date: 04/24/2015   History of Present Illness  Patient is a 81 y/o female who presents with polyarthralgias and elevated markers of inflammation. PMH includes severe OA s/p bilateral knee and shoulder replacements, septic right TKA s/p revision Feb 2016, CAD s/p CABG 2006, and atrial fibrillation. MRI C-spine-MRI C-Spine: Cervical spondylosis. Mild spinal stenosis and mild foraminal stenosis throughout the cervical spine.  Clinical Impression  Patient presents with pain throughout multiple joints- knees, thumbs, foot and decreased AROM throughout cervical spine impacting safe mobility. Pt requiring min A for balance/support due to unsteadiness and guarded gait pattern. Education re: stretching exercises of cervical spine, use of heat/ice as needed and mobility. Encouraged use of RW temporarily until strength/mobility improve. Pt agreeable. Pt will need some intermittent supervision for a few days after discharge for safety. Will follow acutely to maximize independence and mobilty prior to return home.    Follow Up Recommendations Home health PT;Supervision - Intermittent    Equipment Recommendations  None recommended by PT    Recommendations for Other Services       Precautions / Restrictions Precautions Precautions: Fall Restrictions Weight Bearing Restrictions: No      Mobility  Bed Mobility Overal bed mobility: Needs Assistance Bed Mobility: Supine to Sit     Supine to sit: Modified independent (Device/Increase time)     General bed mobility comments: HOB flat, no use of rails to simulate home.  Transfers Overall transfer level: Needs assistance Equipment used: None Transfers: Sit to/from Stand Sit to Stand: Min guard         General transfer comment: Min guard for safety. Bracing BLEs on bed rails for support. Transferred to chair post ambulation  bout.  Ambulation/Gait Ambulation/Gait assistance: Min assist Ambulation Distance (Feet): 200 Feet Assistive device: None Gait Pattern/deviations: Step-through pattern;Decreased stride length   Gait velocity interpretation: <1.8 ft/sec, indicative of risk for recurrent falls General Gait Details: Stiff, guarded gait pattern with decreased arm swing bilaterally. Walking on lateral aspect of foot due to pain. Min A due to unsteadiness.  Stairs            Wheelchair Mobility    Modified Rankin (Stroke Patients Only)       Balance Overall balance assessment: Needs assistance Sitting-balance support: Feet supported;No upper extremity supported Sitting balance-Leahy Scale: Good     Standing balance support: During functional activity Standing balance-Leahy Scale: Fair                               Pertinent Vitals/Pain Pain Assessment: 0-10 Pain Score: 4  Pain Location: neck and thumbs Pain Descriptors / Indicators: Sore Pain Intervention(s): Monitored during session;Repositioned;Heat applied;Limited activity within patient's tolerance    Home Living Family/patient expects to be discharged to:: Private residence Living Arrangements: Alone Available Help at Discharge: Family;Available PRN/intermittently Type of Home: House Home Access: Stairs to enter Entrance Stairs-Rails: Right Entrance Stairs-Number of Steps: 3 Home Layout: One level Home Equipment: Walker - 2 wheels;Cane - single point;Toilet riser      Prior Function Level of Independence: Independent         Comments: 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     Hand Dominance   Dominant Hand: Right    Extremity/Trunk Assessment   Upper Extremity Assessment: Defer to OT evaluation (Decreased grip strength bilaterally.)  Lower Extremity Assessment: LLE deficits/detail   LLE Deficits / Details: Bony prominence on dorsum left  foot, tender to palpation and radiating into toes with weight bearing.   Cervical / Trunk Assessment:  (Limited AROM throughout cervical spine.)  Communication   Communication: No difficulties  Cognition Arousal/Alertness: Awake/alert Behavior During Therapy: WFL for tasks assessed/performed Overall Cognitive Status: Within Functional Limits for tasks assessed                      General Comments General comments (skin integrity, edema, etc.): Family present during session.     Exercises Other Exercises Other Exercises: Cervical AROM exercises and stretching into cervical flexion./ext, SB,rotation holding 30 -45 sec after heat.      Assessment/Plan    PT Assessment Patient needs continued PT services  PT Diagnosis Difficulty walking;Acute pain   PT Problem List Decreased strength;Pain;Decreased range of motion;Decreased balance;Decreased mobility;Decreased activity tolerance  PT Treatment Interventions Balance training;Gait training;Stair training;Functional mobility training;Therapeutic activities;Therapeutic exercise;Patient/family education   PT Goals (Current goals can be found in the Care Plan section) Acute Rehab PT Goals Patient Stated Goal: to get back to independence and decrease pain PT Goal Formulation: With patient Time For Goal Achievement: 05/08/15 Potential to Achieve Goals: Good    Frequency Min 3X/week   Barriers to discharge Decreased caregiver support Pt lives alone    Co-evaluation               End of Session Equipment Utilized During Treatment: Gait belt Activity Tolerance: Patient tolerated treatment well;Patient limited by pain Patient left: in chair;with call bell/phone within reach;with family/visitor present Nurse Communication: Mobility status    Functional Assessment Tool Used: Clinical judgment Functional Limitation: Mobility: Walking and moving around Mobility: Walking and Moving Around Current Status [phone]): At least 20  percent but less than 40 percent impaired, limited or restricted Mobility: Walking and Moving Around Goal Status [phone]): At least 1 percent but less than 20 percent impaired, limited or restricted    Time: 1143-1206 PT Time Calculation (min) (ACUTE ONLY): 23 min   Charges:   PT Evaluation $Initial PT Evaluation Tier I: 1 Procedure PT Treatments $Gait Training: 8-22 mins   PT G Codes:   PT G-Codes **NOT FOR INPATIENT CLASS** Functional Assessment Tool Used: Clinical judgment Functional Limitation: Mobility: Walking and moving around Mobility: Walking and Moving Around Current Status [id number]): At least 20 percent but less than 40 percent impaired, limited or restricted Mobility: Walking and Moving Around Goal Status [phone]): At least 1 percent but less than 20 percent impaired, limited or restricted    [place] 04/24/2015, 12:26 PM Wray Kearns, [place], DPT [phone]

## 2015-04-24 NOTE — Progress Notes (Signed)
Family Medicine Teaching Service Daily Progress Note Intern Pager: [phone]  Patient name: Jacqueline [name] Medical record number: [id number] Date of birth: 08-May-1931 Age: 81 y.o. Gender: female  Primary Care Provider: Zigmund Gottron, MD Consultants: None Code Status: Full  Pt Overview and Major Events to Date:  9/27: Admitted to the [facility] for polyarthralgias.  Assessment and Plan: Jacqueline [name] is a 81 y.o. female presenting with polyarthralgias and elevated markers of inflammation. PMH is significant for severe OA s/p bilateral knee and shoulder replacements, septic right TKA s/p revision Feb 2016, CAD s/p CABG 2006, and atrial fibrillation.   Polyarthralgias: Most consistent with polymyalgia rheumatica without clinical features of giant cell arteritis by history or exam. Not consistent with osteoarthritis alone, though she is severely affected by this as well. - Cultures drawn prior to transfer, will follow to definitively rule out infectious etiology. Expect demargination leukocytosis with steroids, not an indication of infection.  - Given Solumedrol 27m IV x 1, will start Prednisone 436mqd today x 5 days. Plan to taper to 2061m 5 days and then 1m98mtil follow-up appt with PCP. - Rule out rheumatoid arthritis with RF. Consider additional rheum panel elements (ANA, urate, anti-CCP) in the future/based on labs.  - Will order uric acid level today to rule out polyarticular gout. - No indication for temporal artery biopsy, consider this if acute phase reactants remain elevated or systemic symptoms appear despite steroids.  - PT/OT have been consulted. Will f/u their recommendations. - Voltaren, norco for pain control, holding home tramadol as this was ineffective. Holding tizanidine and would discontinue this on discharge - she doesn't recall this medication.   CAD s/p CABG 2006, HTN, and diastolic dysfunction noted on echo in Feb 2016 (EF 55-60%, G2DD):  - Holding prn lasix, no  recent need for this - Continue low dose norvasc, imdur, NTG prn - Continue simvastatin; noted allergies to lipitor, crestor. Presentation not consistent with myalgia/myopathy.  - Daily weight, I/O  Atrial fibrillation: NSR on ECGs dating back at least 12 months.  - Continue metoprolol for rate control, aspirin for anticoagulation (CHADSVasc = 6, formerly on eliquis, switched to ASA after recent diverticulosis)  Axillary furuncles: Appear improved compared with images from recent office visit.  - Finish course of keflex (last dose 9/29)  CKD III-IV: Cr. 1.62 = eCrCl ~ 26ml89m. Worsening baseline evident on labs since March 2016 (Cr 1.05) without suspicion for prerenal cause.  - Monitor Cr, UOP and consider FENa - Avoid nephrotoxic medications  Osteoporosis: No recent falls - Continue Cholecalciferol BID, due for fosamax 10/1 but do not suspect she will still be hospitalized. - Continue PPI in hospital, consider risks and benefits as outpatient.   Anxiety: Likely an element of adjustment disorder also playing a role in recent fatigue since brother's death.  - Continue low dose xanax 0.5mg B39mprn, recommend further discussions of risks and benefits of medication use with PCP. - Continue trazodone 50mg q43mFEN/GI: Saline lock IV, regular diet PPx: Heparin sq  Disposition: Home likely tomorrow, pending PT/OT recommendations.  Subjective:  Pt is doing well this morning. She states that her pain was a 10/10 last night, but is a 4/10 this morning. She was able to walk to the bathroom and shower with minimal assistance. No changes in vision or headache.  Objective: Temp:  [97.8 F (36.6 C)-98.5 F (36.9 C)] 97.8 F (36.6 C) (09/28 0608) [id number] Rate:  [52-137] 58 (09/28 0924) Resp:  [15-20] 18 (09/28  9449) BP: (105-147)/(46-68) 147/58 mmHg (09/28 0924) SpO2:  [93 %-100 %] 97 % (09/28 0608) Weight:  [136 lb 3.9 oz (61.8 kg)-145 lb 1 oz (65.8 kg)] 136 lb 3.9 oz (61.8 kg) (09/28  6759) Physical Exam: General: Pleasant elderly female laying in bed in no distress HEENT: [place]/AT, temples without varicosities or tenderness bilaterally, EOMI, MMM Neck: Supple without lymphadenopathy Cardiovascular: RRR, no m/r/g.  Respiratory: CTAB, normal work of breathing, no wheezes or crackles. Abdomen: +BS, soft, NT, ND, full nontender bladder otherwise no abnormalities MSK: Tenderness to palpation on bilateral knees, wrists, thumb MCPs, shoulders. Strength intact throughout all joints and in all proximal muscle groups. Neck ROM limited by pain but not rigid. No paraspinal spasm. Muscles in the upper and lower extremities are non-tender. Skin: Resolving left axillary folliculitis Neuro: Alert, oriented, nonfocal, speech normal Psych: Flattened affect while talking about brother's death.  Laboratory:  Recent Labs Lab 04/23/15 1151 04/23/15 2245 04/24/15 0745  WBC 9.9 7.6 10.2  HGB 9.5* 9.8* 10.2*  HCT 28.4* 29.7* 30.6*  PLT 168 153 182    Recent Labs Lab 04/23/15 1151 04/23/15 2245 04/24/15 0745  NA 137  --  135  K 3.7  --  4.0  CL 105  --  103  CO2 24  --  21*  BUN 27*  --  23*  CREATININE 1.51* 1.62* 1.52*  CALCIUM 8.7*  --  9.0  PROT 6.7  --   --   BILITOT 0.7  --   --   ALKPHOS 70  --   --   ALT 15  --   --   AST 23  --   --   GLUCOSE 89  --  141*   CRP: 24.7 ESR: 97 Blood culture 9/27 Urine culture 9/27  Imaging/Diagnostic Tests: CXR 2v: Interval resolution of interstitial edema and pleural effusions. No acute cardiopulmonary process. MRI C-Spine: Cervical spondylosis. Mild spinal stenosis and mild foraminal stenosis throughout the cervical spine as described above. No acute disc protrusion.  Sela Hua, MD 04/24/2015, 11:53 AM PGY-1 [place] Intern pager: [phone], text pages welcome

## 2015-04-25 DIAGNOSIS — M2550 Pain in unspecified joint: Secondary | ICD-10-CM | POA: Diagnosis not present

## 2015-04-25 DIAGNOSIS — I5032 Chronic diastolic (congestive) heart failure: Secondary | ICD-10-CM | POA: Diagnosis not present

## 2015-04-25 DIAGNOSIS — M542 Cervicalgia: Secondary | ICD-10-CM | POA: Diagnosis not present

## 2015-04-25 DIAGNOSIS — I4891 Unspecified atrial fibrillation: Secondary | ICD-10-CM | POA: Diagnosis not present

## 2015-04-25 LAB — URINE CULTURE: Culture: >=100000

## 2015-04-25 LAB — RHEUMATOID FACTOR: RHEUMATOID FACTOR: 24.5 [IU]/mL — AB (ref 0.0–13.9)

## 2015-04-25 MED ORDER — HYDROCODONE-ACETAMINOPHEN 5-325 MG PO TABS: 1.0000 | ORAL_TABLET | ORAL | Status: DC | PRN

## 2015-04-25 MED ORDER — FOSFOMYCIN TROMETHAMINE 3 G PO PACK
3.0000 g | PACK | Freq: Once | ORAL | Status: AC
  Administered 2015-04-25: 3 g via ORAL
  Filled 2015-04-25: qty 3

## 2015-04-25 MED ORDER — PREDNISONE 10 MG PO TABS: ORAL_TABLET | ORAL | Status: DC

## 2015-04-25 MED ORDER — FOSFOMYCIN TROMETHAMINE 3 G PO PACK
3.0000 g | PACK | Freq: Once | ORAL | Status: DC
  Filled 2015-04-25: qty 3

## 2015-04-25 NOTE — Discharge Instructions (Signed)
You were hospitalized because you had significant swelling of your joints and joint pain. We think your joint pain was either caused by rheumatoid arthritis or polymyalgia rheumatica. I have attached some additional information about both of these conditions below. We treated you with steroids and your pain significantly improved. We will discharge you with more Prednisone to take until you can be seen by Dr. Andria Frames. You should take Prednisone 40mg  daily for 2 days, then take 20mg  daily for 5 days, and then 10mg  daily until you are seen by Dr. Andria Frames. At your follow-up appointment, Dr. Andria Frames will also make a referral for you to see a Rheumatologist.   We have prescribed you a pain medication called Norco that you can take if you experience severe pain. Please do not take this with Tramadol or Tylenol.  If you have any fevers, chills, worsening joint pain, or an inability to stand or get around your house, please call our clinic at [phone] or go to the Emergency Department.  It was a pleasure meeting you during your hospitalization! ______________________________________________________________________________________________________________ Polymyalgia Rheumatica Polymyalgia rheumatica (also called PMR or polymyalgia) is a rheumatologic (arthritic) condition that causes pain and morning stiffness in your neck, shoulders, and hips. It is an inflammatory condition. In some people, inflammation of certain structures in the shoulder, hips, or other joints can be seen on special testing. It does not cause joint destruction, as occurs in other arthritic conditions. It usually occurs after 79 years of age, and is more common as you age. It can be confused with several other diseases, but it is usually easily treated. People with PMR often have, or can develop, a more severe rheumatologic condition called giant cell arteritis (also called CGA or temporal arteritis).  CAUSES  The exact cause of PMR is not  known.   There are genetic factors involved.  Viruses have been suspected in the cause of PMR. This has not been proven. SYMPTOMS   Aching, pain, and morning stiffness your neck, both shoulders, or both hips.  Symptoms usually start slowly and build gradually.  Morning stiffness usually lasts at least 30 minutes.  Swelling and tenderness in other joints of the arms, hands, legs, and feet may occur.  Swelling and inflammation in the wrists can cause nerve inflammation at the wrist (carpal tunnel syndrome).  You may also have low grade fever, fatigue, weakness, decreased appetite and weight loss. DIAGNOSIS   Your caregiver may suspect that you have PMR based on your description of your symptoms and on your exam.  Your caregiver will examine you to be sure you do not have diseases that can be confused with PMR. These diseases include rheumatoid arthritis, fibromyalgia, or thyroid disease.  Your caregiver should check for signs of giant cell arteritis. This can cause serious complications such as blindness.  Lab tests can help confirm that you have PMR and not other diseases, but are sometimes inconclusive.  X-rays cannot show PMR. However, it can identify other diseases like rheumatoid arthritis. Your caregiver may have you see a specialist in arthritis and inflammatory diseases (rheumatologist). TREATMENT  The goal of treatment is relief of symptoms. Treatment does not shorten the course of the illness or prevent complications. With proper treatment, you usually feel better almost right away.   The initial treatment of PMR is usually a cortisone (steroid) medication. Your caregiver will help determine a starting dose. The dose is gradually reduced every few weeks to months. Treatment usually lasts one to three years.  Other  stronger medications are rarely needed. They will only be prescribed if your symptoms do not get better on cortisone medication alone, or if they recur as the dose  is reduced.  Cortisone medication can have different side effects. With the doses of cortisone needed for PMR, the side effects can affect bones and joints, blood sugar control in diabetes, and mood changes. Discuss this with your caregiver.  Your caregiver will evaluate you regularly during your treatment. They will do this in order to assess progress and to check for complications of the illness or treatment.  Physical therapy is sometimes useful. This is especially true if your joints are still stiff after other symptoms have improved. HOME CARE INSTRUCTIONS   Follow your caregiver's instructions. Do not change your dose of cortisone medication on your own.  Keep your appointments for follow-up lab tests and caregiver visits. Your lab tests need to be monitored. You must get checked periodically for giant cell arteritis.  Follow your caregiver's guidance regarding physical activity (usually no restrictions are needed) or physical therapy.  Your caregiver may have instructions to prevent or check for side effects from cortisone medication (including bone density testing or treatment). Follow their instructions carefully. SEEK MEDICAL CARE IF:   You develop any side effects from treatment. Side effects can include:  Elevated blood pressure.  High blood sugar (or worsening of diabetes, if you are diabetic).  Difficulty fighting off infections.  Weight gain.  Weakness of the bones (osteoporosis).  Your aches, pains, morning stiffness, or other symptoms get worse with time. This is especially true after your dose of cortisone is reduced.  You develop new joint symptoms (pain, swelling, etc.) SEEK IMMEDIATE MEDICAL CARE IF:   You develop a severe headache.  You start vomiting.  You have problems with your vision.  You have an oral temperature above 102 F (38.9 C), not controlled by medicine. Document Released: 08/20/2004 Document Revised: 06/29/2012 Document Reviewed:  12/03/2008 [facility] Patient Information 2015 [place], [place]. This information is not intended to replace advice given to you by your health care provider. Make sure you discuss any questions you have with your health care provider. _________________________________________________________________________________________________________________________ Rheumatoid Arthritis Rheumatoid arthritis is a long-term (chronic) inflammatory disease that causes pain, swelling, and stiffness of the joints. It can affect the entire body, including the eyes and lungs. The effects of rheumatoid arthritis vary widely among those with the condition. CAUSES  The cause of rheumatoid arthritis is not known. It tends to run in families and is more common in women. Certain cells of the body's natural defense system (immune system) do not work properly and begin to attack healthy joints. It primarily involves the connective tissue that lines the joints (synovial membrane). This can cause damage to the joint. SYMPTOMS   Pain, stiffness, swelling, and decreased motion of many joints, especially in the hands and feet.  Stiffness that is worse in the morning. It may last 1-2 hours or longer.  Numbness and tingling in the hands.  Fatigue.  Loss of appetite.  Weight loss.  Low-grade fever.  Dry eyes and mouth.  Firm lumps (rheumatoid nodules) that grow beneath the skin in areas such as the elbows and hands. DIAGNOSIS  Diagnosis is based on the symptoms described, an exam, and blood tests. Sometimes, X-rays are helpful. TREATMENT  The goals of treatment are to relieve pain, reduce inflammation, and to slow down or stop joint damage and disability. Methods vary and may include:  Maintaining a balance of rest,  exercise, and proper nutrition.  Medicines:  Pain relievers (analgesics).  Corticosteroids and nonsteroidal anti-inflammatory drugs (NSAIDs) to reduce inflammation.  Disease-modifying antirheumatic  drugs (DMARDs) to try to slow the course of the disease.  Biologic response modifiers to reduce inflammation and damage.  Physical therapy and occupational therapy.  Surgery for patients with severe joint damage. Joint replacement or fusing of joints may be needed.  Routine monitoring and ongoing care, such as office visits, blood and urine tests, and X-rays. HOME CARE INSTRUCTIONS   Remain physically active and reduce activity when the disease gets worse.  Eat a well-balanced diet.  Put heat on affected joints when you wake up and before activities. Keep the heat on the affected joint for as long as directed by your health care provider.  Put ice on affected joints following activities or exercising.  Put ice in a plastic bag.  Place a towel between your skin and the bag.  Leave the ice on for 15-20 minutes, 3-4 times per day, or as directed by your health care provider.  Take medicines and supplements only as directed by your health care provider.  Use splints as directed by your health care provider. Splints help maintain joint position and function.  Do not sleep with pillows under your knees. This may lead to spasms.  Participate in a self-management program to keep current with the latest treatment and coping skills. SEEK IMMEDIATE MEDICAL CARE IF:  You have fainting episodes.  You have periods of extreme weakness.  You rapidly develop a hot, painful joint that is more severe than usual joint aches.  You have chills.  You have a fever. FOR MORE INFORMATION   American College of Rheumatology: www.rheumatology.[place]: www.arthritis.org Document Released: 07/10/2000 Document Revised: 11/27/2013 Document Reviewed: 08/19/2011 [facility] Patient Information 2015 [place], [place]. This information is not intended to replace advice given to you by your health care provider. Make sure you discuss any questions you have with your health care provider.

## 2015-04-25 NOTE — Progress Notes (Signed)
Family Medicine Teaching Service Daily Progress Note Intern Pager: [phone]  Patient name: Jacqueline [name] Medical record number: [id number] Date of birth: 04-May-1931 Age: 81 y.o. Gender: female  Primary Care Provider: Zigmund Gottron, MD Consultants: None Code Status: Full  Pt Overview and Major Events to Date:  9/27: Admitted to the [facility] for polyarthralgias.  Assessment and Plan: Jacqueline [name] is a 81 y.o. female presenting with polyarthralgias and elevated markers of inflammation. PMH is significant for severe OA s/p bilateral knee and shoulder replacements, septic right TKA s/p revision Feb 2016, CAD s/p CABG 2006, and atrial fibrillation.   Polyarthralgias: Likely rheumatoid arthritis vs polymyalgia rheumatica. No clinical features of giant cell arteritis by history or physical exam. Not consistent with osteoarthritis alone, though she is affected by this as well. - Cultures drawn prior to transfer, will follow to definitively rule out infectious etiology. Expect demargination leukocytosis with steroids, not an indication of infection.  - Blood cultures: NG x < 24 hours - Urine cultures: >100,000 colonies of Enterococcus species. Likely not a clean catch urine, given her many squamous epithelial cells on UA. Pt is not having any dysuria, urinary frequency, or urinary urgency. Will treat with Fosfomycin.  - Given Solumedrol 79m IV x 1, then transitioned to Prednisone 427mqd on 9/29 x 5 days. Plan to taper to 2083m 5 days and then 85m31mtil follow-up appt with PCP. - Rheumatoid factor was elevated at 24.5. Per uptodate, RF can increase with age. Will not order any additional testing at this time. Dr. HensAndria FramesP) will make appointment as an outpatient for her to follow-up with Rheumatology. - Uric acid was 7.8. Although this is elevated, would expect a higher uric acid level if the cause of her joint pain/swelling was polyarthritis due to gout.  - PT recommends HHPT, but Pt does not  believe she needs HHPT. - Pain control: Voltaren, Norco. Holding home tramadol as this was ineffective. Holding tizanidine and would discontinue this on discharge - she doesn't recall this medication.   CAD s/p CABG 2006, HTN, and diastolic dysfunction: noted on ECHO in Feb 2016 (EF 55-60%, G2DD):  - Holding prn lasix, no recent need for this - Continue low dose norvasc, imdur, NTG prn - Continue simvastatin; noted allergies to lipitor, crestor. Presentation not consistent with myalgia/myopathy.  - Daily weight, I/O  Atrial fibrillation: NSR on ECGs dating back at least 12 months. HRs ranging from 51-63 in the last 24 hours. - Continue metoprolol for rate control, aspirin for anticoagulation (CHADSVasc = 6, formerly on eliquis, switched to ASA after recent diverticulosis)  Axillary furuncles: Appear improved compared with images from recent office visit.  - Finish course of keflex (last dose 9/29).  CKD III-IV: Cr. 1.62 = eCrCl ~ 26ml47m. Worsening baseline evident on labs since March 2016 (Cr 1.05) without suspicion for prerenal cause.  - Monitor Cr, UOP and consider FENa as an outpatient. - Avoid nephrotoxic medications  Osteoporosis: No recent falls - Continue Cholecalciferol BID, due for fosamax 10/1 but do not suspect she will still be hospitalized. - Continue PPI in hospital, consider risks and benefits as outpatient.   Anxiety: Likely an element of adjustment disorder also playing a role in recent fatigue since brother's death.  - Continue low dose xanax 0.5mg B57mprn, recommend further discussions of risks and benefits of medication use with PCP. - Continue trazodone 50mg q6mFEN/GI: Saline lock IV, regular diet PPx: Heparin sq  Disposition: Home today, given significant clinical  improvement.  Subjective:  Pt states she is doing really well today. She feels much better than when she came in. She denies any headaches or changes in vision. She denies any dysuria,  increased urinary frequency, or increased urinary urgency.  Objective: Temp:  [97.5 F (36.4 C)-97.7 F (36.5 C)] 97.7 F (36.5 C) (09/29 0650) Pulse Rate:  [51-63] 51 (09/29 0650) Resp:  [18-20] 20 (09/29 0650) BP: (132-147)/(53-72) 139/67 mmHg (09/29 0650) SpO2:  [96 %-99 %] 97 % (09/29 0650) Weight:  [140 lb 14 oz (63.9 kg)] 140 lb 14 oz (63.9 kg) (09/29 0650) Physical Exam: General: Pleasant elderly female sitting up in bed, in NAD. HEENT: [place]/AT, temples without varicosities or tenderness bilaterally, EOMI, MMM Neck: Supple  Cardiovascular: RRR, no m/r/g.  Respiratory: CTAB, normal work of breathing, no wheezes or crackles. Abdomen: +BS, soft, NT, ND, full nontender bladder otherwise no abnormalities MSK: Mild TTP of bilateral knees, wrists, thumb MCPs, and shoulders. Strength intact throughout all joints and in all proximal muscle groups. Full ROM of neck, although has some tenderness with movement. Muscles in the upper and lower extremities are non-tender. Skin: Resolving left axillary folliculitis Neuro: Alert, oriented, nonfocal, speech normal Psych: Flattened affect while talking about brother's death.  Laboratory:  Recent Labs Lab 04/23/15 1151 04/23/15 2245 04/24/15 0745  WBC 9.9 7.6 10.2  HGB 9.5* 9.8* 10.2*  HCT 28.4* 29.7* 30.6*  PLT 168 153 182    Recent Labs Lab 04/23/15 1151 04/23/15 2245 04/24/15 0745  NA 137  --  135  K 3.7  --  4.0  CL 105  --  103  CO2 24  --  21*  BUN 27*  --  23*  CREATININE 1.51* 1.62* 1.52*  CALCIUM 8.7*  --  9.0  PROT 6.7  --   --   BILITOT 0.7  --   --   ALKPHOS 70  --   --   ALT 15  --   --   AST 23  --   --   GLUCOSE 89  --  141*   CRP: 24.7 ESR: 97 Rheumatoid factor: 24.5 (H) Blood culture 9/27: NG x < 24 hours UA: small leukocytes, many squamous epithelial cells, 3-6 WBC Urine culture 9/27: >100,000 CFU Enterococcus species  Imaging/Diagnostic Tests: CXR 2v: Interval resolution of interstitial edema and  pleural effusions. No acute cardiopulmonary process. MRI C-Spine: Cervical spondylosis. Mild spinal stenosis and mild foraminal stenosis throughout the cervical spine as described above. No acute disc protrusion.  Sela Hua, MD 04/25/2015, 7:25 AM PGY-1 [place] Intern pager: [phone], text pages welcome

## 2015-04-25 NOTE — Progress Notes (Signed)
Spoke to pt at length about OT.  Pt states she is walking to bathroom with walker Ily and can do all basic adls.  Pt does not want someone to come to house for OT and feels she is fine with her basic adls.  Pt is not in need of acute OT at this time and PT to follow for minor balance issues. Jinger Neighbors, [place] [phone]

## 2015-04-25 NOTE — Care Management Note (Signed)
Case Management Note  Patient Details  Name: Jacqueline [name] MRN: [id number] Date of Birth: Nov 30, 1930  Subjective/Objective:                    Action/Plan:  PT recommending home health PT and intermittent supervision , patient aware , but states she does not feel like she needs either one , if she changes her mind she will call her PCP to arrange . She has had knee surgery in past and has all DME.   Expected Discharge Date:                  Expected Discharge Plan:  Home/Self Care  In-House Referral:     Discharge planning Services  CM Consult  Post Acute Care Choice:  Home Health Choice offered to:  Patient  DME Arranged:    DME Agency:     HH Arranged:  Patient Refused [place] Agency:     Status of Service:  Completed, signed off  Medicare Important Message Given:    Date Medicare IM Given:    Medicare IM give by:    Date Additional Medicare IM Given:    Additional Medicare Important Message give by:     If discussed at [place] of Stay Meetings, dates discussed:    Additional Comments:  Marilu Favre, RN 04/25/2015, 10:54 AM

## 2015-04-25 NOTE — Progress Notes (Signed)
PT Cancellation Note  Patient Details Name: Jacqueline [name] MRN: [id number] DOB: 09-20-1930   Cancelled Treatment:    Reason Eval/Treat Not Completed: Patient declined, no reason specified Declines to work with therapy at this time. Also states she does not want HHPT. Will follow up as time allows.  Ellouise Newer 04/25/2015, 11:06 AM Elayne Snare, [place]

## 2015-04-26 LAB — SEDIMENTATION RATE: SED RATE: 67 mm/h — AB (ref 0–22)

## 2015-04-26 NOTE — Discharge Summary (Signed)
[facility] Discharge Summary  Patient name: Jacqueline [name] Medical record number: [id number] Date of birth: 1930/10/14 Age: 81 y.o. Gender: female Date of Admission: 04/23/2015  Date of Discharge: 04/25/15 Admitting Physician: Dickie La, MD  Primary Care Provider: Zigmund Gottron, MD Consultants: None  Indication for Hospitalization: Acute polyarthralgias, inability to stand  Discharge Diagnoses/Problem List:  Arthralgias Atrial Fibrillation HTN CHF CKD HLD  Disposition: Home  Discharge Condition: Stable, improved  Discharge Exam:  General: Pleasant elderly female sitting up in bed, in NAD. HEENT: [place]/AT, temples without varicosities or tenderness bilaterally, EOMI, MMM Neck: Supple  Cardiovascular: RRR, no m/r/g.  Respiratory: CTAB, normal work of breathing, no wheezes or crackles. Abdomen: +BS, soft, NT, ND, full nontender bladder otherwise no abnormalities MSK: Mild TTP of bilateral knees, wrists, thumb MCPs, and shoulders. Strength intact throughout all joints and in all proximal muscle groups. Full ROM of neck, although has some tenderness with movement. Muscles in the upper and lower extremities are non-tender. Skin: Resolving left axillary folliculitis Neuro: Alert, oriented, nonfocal, speech normal Psych: Flattened affect while talking about brother's death.  Brief Hospital Course:  Jacqueline [name] is a 81 year old female who presented to the [facility] ED with diffuse polyarthralgias most notably in the thumbs, hands, and wrists. She also endorsed weakness and stiffness. In the ED, she was found to have significantly elevated inflammatory markers with a CRP of 24.7 and an ESR of 67. Blood cultures and urine cultures were drawn, but she had no obvious sources of infection. She was given Solumedrol x 1 and was transferred to [facility] for further management. We transitioned her to Prednisone 8m. She improved markedly overnight with a  significant reduction in pain. She was able to walk to the bathroom with minimal assistance. Rheumatoid factor was 24.5 and uric acid was 7.8. We thought her polyarthralgias were likely due to polymyalgia rhematica versus rheumatoid arthritis. Throughout her hospitalization, she did not endorse any headaches or changes in vision and she did not have any tenderness to palpation of her scalp or temporal area. Her blood cultures that were drawn on admission showed no growth. Her urine culture grew >100,000 colonies of an Enterococcus species. She was treated with Fosfomycin before discharge. She was evaluated by physical therapy, who recommended home health PT; however, Ms. Koenigs stated that she was feeling much better and would not like this service. She was also evaluated by OT, who did not think she would benefit from any additional therapy. She was discharged home in stable condition. She should continue a steroid taper until evaluated by her PCP. She should take Prednisone 462mx 5 days, 2067m 5 days, and then 36m38mtil seen by her PCP.  Issues for Follow Up:  1. Consider referral to Rheumatology for further evaluation. 2. Jacqueline [name] discharged with a steroid taper: Prednisone 40mg46m days, 20mg 57mdays, and 36mg u79m she is seen by her PCP. Please make sure she has completed this taper.   Significant Procedures: None  Significant Labs and Imaging:   Recent Labs Lab 04/23/15 1151 04/23/15 2245 04/24/15 0745  WBC 9.9 7.6 10.2  HGB 9.5* 9.8* 10.2*  HCT 28.4* 29.7* 30.6*  PLT 168 153 182    Recent Labs Lab 04/23/15 1151 04/23/15 2245 04/24/15 0745  NA 137  --  135  K 3.7  --  4.0  CL 105  --  103  CO2 24  --  21*  GLUCOSE 89  --  141*  BUN 27*  --  23*  CREATININE 1.51* 1.62* 1.52*  CALCIUM 8.7*  --  9.0  ALKPHOS 70  --   --   AST 23  --   --   ALT 15  --   --   ALBUMIN 3.1*  --   --    -Rheumatoid factor: 24.5 -UA (9/27): small leukocytes, negative nitrites, many  squamous epithelial cells, 3-6 WBC -Blood cultures (9/27): NG  -Urine culture (9/27): >100,000 CFU Enterococcus species  Results/Tests Pending at Time of Discharge: None  Discharge Medications:    Medication List    STOP taking these medications        cephALEXin 500 MG capsule  Commonly known as:  KEFLEX     tiZANidine 4 MG tablet  Commonly known as:  ZANAFLEX      TAKE these medications        acetaminophen 500 MG tablet  Commonly known as:  TYLENOL  Take 500-1,000 mg by mouth every 6 (six) hours as needed for moderate pain or headache.     alendronate 70 MG tablet  Commonly known as:  FOSAMAX  Take 1 tablet (70 mg total) by mouth once a week. Take with a full glass of water on an empty stomach, remain upright     ALPRAZolam 0.5 MG tablet  Commonly known as:  XANAX  Take 1 tablet (0.5 mg total) by mouth 2 (two) times daily as needed for anxiety.     amLODipine 2.5 MG tablet  Commonly known as:  NORVASC  Take 2.5 mg by mouth daily.     ARTIFICIAL TEARS OP  Place 1 drop into both eyes 3 (three) times daily as needed (dry eyes).     aspirin EC 81 MG tablet  Take 81 mg by mouth daily.     diclofenac sodium 1 % Gel  Commonly known as:  VOLTAREN  Apply 2 g topically 3 (three) times daily as needed (pain).     diphenhydrAMINE 25 MG tablet  Commonly known as:  BENADRYL  Take 50 mg by mouth 2 (two) times daily as needed for allergies (eye allergies).     Fish Oil 1000 MG Caps  Take 1-2 capsules by mouth 2 (two) times daily. Takes one in the morning and two capsules at night.     furosemide 20 MG tablet  Commonly known as:  LASIX  Take 1 tablet (20 mg total) by mouth daily.     GERITOL COMPLETE PO  Take 1 tablet by mouth daily.     HYDROcodone-acetaminophen 5-325 MG tablet  Commonly known as:  NORCO/VICODIN  Take 1-2 tablets by mouth every 4 (four) hours as needed for severe pain. Please do not take at the same time as Tramadol.     hydrocortisone 25 MG  suppository  Commonly known as:  ANUSOL-HC  Place 1 suppository (25 mg total) rectally 2 (two) times daily.     isosorbide mononitrate 30 MG 24 hr tablet  Commonly known as:  IMDUR  Take 30 mg by mouth daily.     ketoconazole 2 % cream  Commonly known as:  NIZORAL  Apply 1 application topically daily.     loperamide 2 MG capsule  Commonly known as:  IMODIUM  Take 1 capsule (2 mg total) by mouth as needed for diarrhea or loose stools.     metoprolol 50 MG tablet  Commonly known as:  LOPRESSOR  Take 1 tablet (50 mg  total) by mouth 2 (two) times daily.     nitroGLYCERIN 0.4 MG SL tablet  Commonly known as:  NITROSTAT  Place 1 tablet (0.4 mg total) under the tongue every 5 (five) minutes as needed for chest pain.     nystatin 100000 UNIT/GM Powd  apply to area TOPICALLY 3 times daily until healing complete     omeprazole 20 MG capsule  Commonly known as:  PRILOSEC  Take 1 capsule (20 mg total) by mouth daily.     polyethylene glycol packet  Commonly known as:  MIRALAX / GLYCOLAX  Take 17 g by mouth 2 (two) times daily.     predniSONE 10 MG tablet  Commonly known as:  DELTASONE  Take 4 tablets by mouth for 2 days, then 2 tablets by mouth for 5 days, then 1 tablet by mouth.     rOPINIRole 0.25 MG tablet  Commonly known as:  REQUIP  Take 1 tablet (0.25 mg total) by mouth 3 (three) times daily.     simvastatin 40 MG tablet  Commonly known as:  ZOCOR  Take 0.5 tablets (20 mg total) by mouth at bedtime.     traMADol 50 MG tablet  Commonly known as:  ULTRAM  TAKE 1 TABLET BY MOUTH TWICE A DAY     traZODone 50 MG tablet  Commonly known as:  DESYREL  Take 1 tablet (50 mg total) by mouth at bedtime.     triamcinolone cream 0.1 %  Commonly known as:  KENALOG  Apply 1 application topically 2 (two) times daily as needed.     vitamin B-12 100 MCG tablet  Commonly known as:  CYANOCOBALAMIN  Take 2,000 mcg by mouth 2 (two) times daily.     Vitamin D-3 1000 UNITS Caps  Take  2,000 Units by mouth 2 (two) times daily.        Discharge Instructions: Please refer to Patient Instructions section of EMR for full details.  Patient was counseled important signs and symptoms that should prompt return to medical care, changes in medications, dietary instructions, activity restrictions, and follow up appointments.   Follow-Up Appointments: Follow-up Information    Follow up with Zigmund Gottron, MD. Go on 05/01/2015.   Specialty:  Family Medicine   Why:  1:45pm hospital follow-up   Contact information:   [place] [address] [place], MD 04/28/2015, 3:49 PM PGY-1, [place]

## 2015-04-28 LAB — CULTURE, BLOOD (ROUTINE X 2)
CULTURE: NO GROWTH
Culture: NO GROWTH

## 2015-04-30 ENCOUNTER — Telehealth: Payer: Self-pay | Admitting: Family Medicine

## 2015-04-30 NOTE — Telephone Encounter (Signed)
Paged to [facility] Emergency Line @ approximately 2200 by patient, Jacqueline [name]. She reports that she was just recently discharged from hospital (9/27 to 9/29) admitted for polyarthralgias with her RA also treated for a UTI in hospital. She states tonight that she went to urinate and noticed that she had "blood in her urine", also endorses some urinary frequency and mild dysuria, her symptoms seemed to improve and were resolved after treated in hospital but now may be coming back with regards to UTI. Recent history with antibiotics includes Keflex 500mg  BID x 10 days starting 9/19 until 9/27 for infected furuncles under her arm pit, then in hospital treated with fosfomycin x 1 dose for UTI, which urine culture was positive and confirmed 100k CFU enterococcus pan sensitive.  She seeks advice on if she needs to do anything additional tonight, as she has a Hospital Follow-up apt with PCP tomorrow on 10/5. I advised her that her UTI may have returned, but there is nothing we would do tonight, advised that we wouldn't call in any antibiotics, and that most important next step is to follow-up tomorrow and we would re-check her urine, and may re-culture. She denied any of the other pain or symptoms that caused her to go to hospital last time. Also denies any fevers/chills, sweats, CP, SOB, abdominal pain, nausea / vomiting, back/flank pain. She understands plan and will f/u tomorrow.  [name], [place], PGY-3

## 2015-05-01 ENCOUNTER — Ambulatory Visit (HOSPITAL_COMMUNITY)
Admission: RE | Admit: 2015-05-01 | Discharge: 2015-05-01 | Disposition: A | Discharge status: Home / Self Care | Payer: PPO | Source: Ambulatory Visit | Attending: Family Medicine | Admitting: Family Medicine

## 2015-05-01 ENCOUNTER — Ambulatory Visit (INDEPENDENT_AMBULATORY_CARE_PROVIDER_SITE_OTHER): Payer: PPO | Admitting: Family Medicine

## 2015-05-01 VITALS — BP 118/90 | HR 134 | Temp 98.0°F | Ht 61.0 in | Wt 135.5 lb

## 2015-05-01 DIAGNOSIS — R319 Hematuria, unspecified: Secondary | ICD-10-CM

## 2015-05-01 DIAGNOSIS — N3001 Acute cystitis with hematuria: Secondary | ICD-10-CM | POA: Diagnosis not present

## 2015-05-01 DIAGNOSIS — I4891 Unspecified atrial fibrillation: Secondary | ICD-10-CM | POA: Insufficient documentation

## 2015-05-01 DIAGNOSIS — R9431 Abnormal electrocardiogram [ECG] [EKG]: Secondary | ICD-10-CM | POA: Diagnosis not present

## 2015-05-01 DIAGNOSIS — I10 Essential (primary) hypertension: Secondary | ICD-10-CM

## 2015-05-01 DIAGNOSIS — M2550 Pain in unspecified joint: Secondary | ICD-10-CM

## 2015-05-01 DIAGNOSIS — R300 Dysuria: Secondary | ICD-10-CM

## 2015-05-01 DIAGNOSIS — I480 Paroxysmal atrial fibrillation: Secondary | ICD-10-CM

## 2015-05-01 DIAGNOSIS — N390 Urinary tract infection, site not specified: Secondary | ICD-10-CM | POA: Insufficient documentation

## 2015-05-01 LAB — POCT UA - MICROSCOPIC ONLY

## 2015-05-01 LAB — POCT URINALYSIS DIPSTICK
Bilirubin, UA: NEGATIVE
GLUCOSE UA: NEGATIVE
Nitrite, UA: NEGATIVE
SPEC GRAV UA: 1.02
Urobilinogen, UA: 0.2
pH, UA: 5.5

## 2015-05-01 MED ORDER — METOPROLOL TARTRATE 50 MG PO TABS: 100.0000 mg | ORAL_TABLET | Freq: Two times a day (BID) | ORAL | Status: DC

## 2015-05-01 MED ORDER — AMOXICILLIN 500 MG PO CAPS: 500.0000 mg | ORAL_CAPSULE | Freq: Three times a day (TID) | ORAL | Status: DC

## 2015-05-01 NOTE — Progress Notes (Signed)
   Subjective:    Patient ID: Jacqueline [name], female    DOB: [birth year], 81 y.o.   MRN: [id number]  HPI  Several issues. 1. Hospital follow up: acute inflammatory polyarthralgia.  High sed rate and positive Rhematoid factor.  Treated with steroids.  Feels some better. No swelling of joints (wrists).  Still with pain.   2. Unable to tolerate prednisone.  Caused heart to race. 3. Hx of PAF.  Was in [place] in hospital.   4. Weak all over.  Seems to be related to #1.  Has had a couple of falls without injury. 5. Blood in urine noted last night.  No fever.  Documented to have an enterococcus UTI in hospital.  Treated with Keflex?     Review of Systems     Objective:   Physical Exam pulse tachy and irreg irreg EKG shows a fib with RVR Cardiac irreg irreg with 2/6 SEM Lungs clear No edema.        Assessment & Plan:

## 2015-05-01 NOTE — Patient Instructions (Addendum)
Check your medications against the list I am giving you.  Make sure we agree on what you are and are not taking. Stop the amlodipine.  It is a blood pressure medicine and your blood pressure is a bit on the low side right now.   Increase your metoprolol to two pills twice a day.  That medicine slows your heart rate when you are in the a fib.  Right now your heart rate is too fast.  Double check on the dose of the pill. Stay off the prednisone. OK to take tramadol, tylenol and a nerve pill. See me tomorrow.  Make an appointment of the way.   I sent in a prescription for an antibiotic for the urine infection.  Please get that tonight.

## 2015-05-02 ENCOUNTER — Encounter: Payer: Self-pay | Admitting: Family Medicine

## 2015-05-02 ENCOUNTER — Ambulatory Visit (INDEPENDENT_AMBULATORY_CARE_PROVIDER_SITE_OTHER): Payer: PPO | Admitting: Family Medicine

## 2015-05-02 VITALS — BP 120/66 | HR 92 | Temp 97.8°F | Ht 61.0 in | Wt 137.3 lb

## 2015-05-02 DIAGNOSIS — I480 Paroxysmal atrial fibrillation: Secondary | ICD-10-CM | POA: Diagnosis not present

## 2015-05-02 DIAGNOSIS — I4891 Unspecified atrial fibrillation: Secondary | ICD-10-CM | POA: Diagnosis not present

## 2015-05-02 DIAGNOSIS — M2550 Pain in unspecified joint: Secondary | ICD-10-CM | POA: Diagnosis not present

## 2015-05-02 DIAGNOSIS — I10 Essential (primary) hypertension: Secondary | ICD-10-CM | POA: Diagnosis not present

## 2015-05-02 DIAGNOSIS — D500 Iron deficiency anemia secondary to blood loss (chronic): Secondary | ICD-10-CM

## 2015-05-02 MED ORDER — CELECOXIB 100 MG PO CAPS: 100.0000 mg | ORAL_CAPSULE | Freq: Two times a day (BID) | ORAL | Status: DC

## 2015-05-02 MED ORDER — ENALAPRIL MALEATE 10 MG PO TABS: 10.0000 mg | ORAL_TABLET | Freq: Every day | ORAL | Status: DC

## 2015-05-02 MED ORDER — METOPROLOL TARTRATE 100 MG PO TABS: 100.0000 mg | ORAL_TABLET | Freq: Two times a day (BID) | ORAL | Status: DC

## 2015-05-02 NOTE — Assessment & Plan Note (Signed)
Concerned about previous GI bleed as we weigh options for polyarthralgia treatment.

## 2015-05-02 NOTE — Assessment & Plan Note (Signed)
Still concerned about overtreatment.  Will decrease enalapril to 10 mg daily.

## 2015-05-02 NOTE — Progress Notes (Signed)
   Subjective:    Patient ID: Jacqueline [name], female    DOB: [birth year], 81 y.o.   MRN: [id number]  HPI Returns today for follow up of 1. A fib - still irregular pulse.  Rate is now controled. 2. Hypertension, concern for overtreatment.  She reviewed yesterday's med list and had not been on amlodipine for months.  Missing from her med list was enalapril bid which I added. 3. Polyarthralgias.  Still quite severe.  Generalized weakness.    Review of Systems     Objective:   Physical Exam BP and pulse noted Irreg irreg with 2/6 SEM Lungs clear No swollen joints.  Using wheelchair due to weakness, generalized.       Assessment & Plan:

## 2015-05-02 NOTE — Assessment & Plan Note (Signed)
Now rate controled and on ASA.  No change.

## 2015-05-02 NOTE — Assessment & Plan Note (Signed)
No change for now.  Will address in am.

## 2015-05-02 NOTE — Assessment & Plan Note (Signed)
Now in a fib.  HR not controled.  Increase metoprolol.  FU in am.

## 2015-05-02 NOTE — Assessment & Plan Note (Signed)
  Will treat known enterococcus from hosp culture with amox.  Likely not adaquately treated with keflex.

## 2015-05-02 NOTE — Patient Instructions (Addendum)
For the a fib: Stay on the metoprolol 100 mg two times per day.  I sent in a new prescription for the higher dose. Because I do not want your blood pressure too low, cut back the enalapril to 10 mg once a day. See me in 3-4 weeks. I sent in a new prescription for celebrex. It is an ibuprofen type drug that has less chance of bleeding.

## 2015-05-02 NOTE — Assessment & Plan Note (Signed)
Concerned about overtreatment.  Stop amlodipine.  Esp since I am increasing metoprolol

## 2015-05-02 NOTE — Assessment & Plan Note (Signed)
Reluctant to go back on prednisone.  I will start celebrex (hx of GI bleed) and get rheumatology referral.

## 2015-05-06 ENCOUNTER — Inpatient Hospital Stay (HOSPITAL_COMMUNITY)
Admission: EM | Admit: 2015-05-06 | Discharge: 2015-05-14 | DRG: 286 | Disposition: A | Discharge status: Home / Self Care | Payer: PPO | Attending: Cardiovascular Disease | Admitting: Cardiovascular Disease

## 2015-05-06 ENCOUNTER — Emergency Department (HOSPITAL_COMMUNITY): Payer: PPO

## 2015-05-06 ENCOUNTER — Encounter (HOSPITAL_COMMUNITY): Payer: Self-pay | Admitting: *Deleted

## 2015-05-06 DIAGNOSIS — B952 Enterococcus as the cause of diseases classified elsewhere: Secondary | ICD-10-CM | POA: Diagnosis present

## 2015-05-06 DIAGNOSIS — K5900 Constipation, unspecified: Secondary | ICD-10-CM | POA: Diagnosis not present

## 2015-05-06 DIAGNOSIS — R001 Bradycardia, unspecified: Secondary | ICD-10-CM | POA: Diagnosis present

## 2015-05-06 DIAGNOSIS — I272 Other secondary pulmonary hypertension: Secondary | ICD-10-CM | POA: Diagnosis present

## 2015-05-06 DIAGNOSIS — I495 Sick sinus syndrome: Principal | ICD-10-CM | POA: Diagnosis present

## 2015-05-06 DIAGNOSIS — T447X5A Adverse effect of beta-adrenoreceptor antagonists, initial encounter: Secondary | ICD-10-CM | POA: Diagnosis present

## 2015-05-06 DIAGNOSIS — N182 Chronic kidney disease, stage 2 (mild): Secondary | ICD-10-CM

## 2015-05-06 DIAGNOSIS — R0902 Hypoxemia: Secondary | ICD-10-CM | POA: Diagnosis present

## 2015-05-06 DIAGNOSIS — Z79899 Other long term (current) drug therapy: Secondary | ICD-10-CM | POA: Diagnosis not present

## 2015-05-06 DIAGNOSIS — Z951 Presence of aortocoronary bypass graft: Secondary | ICD-10-CM | POA: Diagnosis not present

## 2015-05-06 DIAGNOSIS — I480 Paroxysmal atrial fibrillation: Secondary | ICD-10-CM

## 2015-05-06 DIAGNOSIS — Z7982 Long term (current) use of aspirin: Secondary | ICD-10-CM

## 2015-05-06 DIAGNOSIS — M353 Polymyalgia rheumatica: Secondary | ICD-10-CM | POA: Diagnosis present

## 2015-05-06 DIAGNOSIS — D72829 Elevated white blood cell count, unspecified: Secondary | ICD-10-CM | POA: Insufficient documentation

## 2015-05-06 DIAGNOSIS — N183 Chronic kidney disease, stage 3 (moderate): Secondary | ICD-10-CM | POA: Diagnosis present

## 2015-05-06 DIAGNOSIS — I119 Hypertensive heart disease without heart failure: Secondary | ICD-10-CM | POA: Diagnosis present

## 2015-05-06 DIAGNOSIS — N390 Urinary tract infection, site not specified: Secondary | ICD-10-CM | POA: Diagnosis present

## 2015-05-06 DIAGNOSIS — Z791 Long term (current) use of non-steroidal anti-inflammatories (NSAID): Secondary | ICD-10-CM

## 2015-05-06 DIAGNOSIS — I9589 Other hypotension: Secondary | ICD-10-CM | POA: Diagnosis not present

## 2015-05-06 DIAGNOSIS — R579 Shock, unspecified: Secondary | ICD-10-CM | POA: Diagnosis present

## 2015-05-06 DIAGNOSIS — N289 Disorder of kidney and ureter, unspecified: Secondary | ICD-10-CM

## 2015-05-06 DIAGNOSIS — M25511 Pain in right shoulder: Secondary | ICD-10-CM | POA: Insufficient documentation

## 2015-05-06 DIAGNOSIS — I5032 Chronic diastolic (congestive) heart failure: Secondary | ICD-10-CM | POA: Diagnosis not present

## 2015-05-06 DIAGNOSIS — I25811 Atherosclerosis of native coronary artery of transplanted heart without angina pectoris: Secondary | ICD-10-CM

## 2015-05-06 DIAGNOSIS — Z96651 Presence of right artificial knee joint: Secondary | ICD-10-CM | POA: Diagnosis present

## 2015-05-06 DIAGNOSIS — E785 Hyperlipidemia, unspecified: Secondary | ICD-10-CM | POA: Diagnosis present

## 2015-05-06 DIAGNOSIS — I5033 Acute on chronic diastolic (congestive) heart failure: Secondary | ICD-10-CM | POA: Diagnosis not present

## 2015-05-06 DIAGNOSIS — I959 Hypotension, unspecified: Secondary | ICD-10-CM | POA: Diagnosis not present

## 2015-05-06 DIAGNOSIS — R0602 Shortness of breath: Secondary | ICD-10-CM

## 2015-05-06 DIAGNOSIS — N1832 Chronic kidney disease, stage 3b: Secondary | ICD-10-CM | POA: Diagnosis present

## 2015-05-06 DIAGNOSIS — I2510 Atherosclerotic heart disease of native coronary artery without angina pectoris: Secondary | ICD-10-CM | POA: Diagnosis present

## 2015-05-06 DIAGNOSIS — K219 Gastro-esophageal reflux disease without esophagitis: Secondary | ICD-10-CM | POA: Diagnosis present

## 2015-05-06 DIAGNOSIS — I509 Heart failure, unspecified: Secondary | ICD-10-CM

## 2015-05-06 DIAGNOSIS — I481 Persistent atrial fibrillation: Secondary | ICD-10-CM | POA: Diagnosis present

## 2015-05-06 DIAGNOSIS — I130 Hypertensive heart and chronic kidney disease with heart failure and stage 1 through stage 4 chronic kidney disease, or unspecified chronic kidney disease: Secondary | ICD-10-CM | POA: Diagnosis present

## 2015-05-06 DIAGNOSIS — N179 Acute kidney failure, unspecified: Secondary | ICD-10-CM | POA: Diagnosis present

## 2015-05-06 DIAGNOSIS — N184 Chronic kidney disease, stage 4 (severe): Secondary | ICD-10-CM | POA: Diagnosis present

## 2015-05-06 DIAGNOSIS — M25512 Pain in left shoulder: Secondary | ICD-10-CM

## 2015-05-06 DIAGNOSIS — N189 Chronic kidney disease, unspecified: Secondary | ICD-10-CM | POA: Diagnosis not present

## 2015-05-06 DIAGNOSIS — D638 Anemia in other chronic diseases classified elsewhere: Secondary | ICD-10-CM | POA: Diagnosis present

## 2015-05-06 DIAGNOSIS — I10 Essential (primary) hypertension: Secondary | ICD-10-CM | POA: Diagnosis not present

## 2015-05-06 DIAGNOSIS — I43 Cardiomyopathy in diseases classified elsewhere: Secondary | ICD-10-CM | POA: Diagnosis present

## 2015-05-06 DIAGNOSIS — I270 Primary pulmonary hypertension: Secondary | ICD-10-CM | POA: Diagnosis not present

## 2015-05-06 LAB — URINE MICROSCOPIC-ADD ON

## 2015-05-06 LAB — I-STAT CHEM 8, ED
BUN: 45 mg/dL — ABNORMAL HIGH (ref 6–20)
CALCIUM ION: 1.18 mmol/L (ref 1.13–1.30)
CHLORIDE: 105 mmol/L (ref 101–111)
Creatinine, Ser: 2.5 mg/dL — ABNORMAL HIGH (ref 0.44–1.00)
GLUCOSE: 129 mg/dL — AB (ref 65–99)
HCT: 30 % — ABNORMAL LOW (ref 36.0–46.0)
HEMOGLOBIN: 10.2 g/dL — AB (ref 12.0–15.0)
Potassium: 4.6 mmol/L (ref 3.5–5.1)
SODIUM: 137 mmol/L (ref 135–145)
TCO2: 19 mmol/L (ref 0–100)

## 2015-05-06 LAB — URINALYSIS, ROUTINE W REFLEX MICROSCOPIC
Bilirubin Urine: NEGATIVE
Glucose, UA: NEGATIVE mg/dL
HGB URINE DIPSTICK: NEGATIVE
KETONES UR: NEGATIVE mg/dL
Nitrite: NEGATIVE
PROTEIN: NEGATIVE mg/dL
Specific Gravity, Urine: 1.02 (ref 1.005–1.030)
UROBILINOGEN UA: 0.2 mg/dL (ref 0.0–1.0)
pH: 5 (ref 5.0–8.0)

## 2015-05-06 LAB — GLUCOSE, CAPILLARY: GLUCOSE-CAPILLARY: 109 mg/dL — AB (ref 65–99)

## 2015-05-06 LAB — COMPREHENSIVE METABOLIC PANEL
ALT: 146 U/L — AB (ref 14–54)
AST: 81 U/L — AB (ref 15–41)
Albumin: 2.9 g/dL — ABNORMAL LOW (ref 3.5–5.0)
Alkaline Phosphatase: 78 U/L (ref 38–126)
Anion gap: 10 (ref 5–15)
BUN: 48 mg/dL — AB (ref 6–20)
CHLORIDE: 105 mmol/L (ref 101–111)
CO2: 22 mmol/L (ref 22–32)
CREATININE: 2.44 mg/dL — AB (ref 0.44–1.00)
Calcium: 8.7 mg/dL — ABNORMAL LOW (ref 8.9–10.3)
GFR calc non Af Amer: 17 mL/min — ABNORMAL LOW (ref >60)
GFR, EST AFRICAN AMERICAN: 20 mL/min — AB (ref >60)
Glucose, Bld: 134 mg/dL — ABNORMAL HIGH (ref 65–99)
POTASSIUM: 4.7 mmol/L (ref 3.5–5.1)
SODIUM: 137 mmol/L (ref 135–145)
Total Bilirubin: 0.7 mg/dL (ref 0.3–1.2)
Total Protein: 5.9 g/dL — ABNORMAL LOW (ref 6.5–8.1)

## 2015-05-06 LAB — CBC WITH DIFFERENTIAL/PLATELET
BASOS ABS: 0 10*3/uL (ref 0.0–0.1)
Basophils Relative: 0 %
EOS ABS: 0.2 10*3/uL (ref 0.0–0.7)
EOS PCT: 2 %
HCT: 26.8 % — ABNORMAL LOW (ref 36.0–46.0)
Hemoglobin: 9.1 g/dL — ABNORMAL LOW (ref 12.0–15.0)
Lymphocytes Relative: 16 %
Lymphs Abs: 1.4 10*3/uL (ref 0.7–4.0)
MCH: 31.8 pg (ref 26.0–34.0)
MCHC: 34 g/dL (ref 30.0–36.0)
MCV: 93.7 fL (ref 78.0–100.0)
Monocytes Absolute: 0.6 10*3/uL (ref 0.1–1.0)
Monocytes Relative: 7 %
Neutro Abs: 6.4 10*3/uL (ref 1.7–7.7)
Neutrophils Relative %: 75 %
PLATELETS: 182 10*3/uL (ref 150–400)
RBC: 2.86 MIL/uL — AB (ref 3.87–5.11)
RDW: 15.1 % (ref 11.5–15.5)
WBC: 8.6 10*3/uL (ref 4.0–10.5)

## 2015-05-06 LAB — MRSA PCR SCREENING: MRSA BY PCR: NEGATIVE

## 2015-05-06 LAB — I-STAT CG4 LACTIC ACID, ED: LACTIC ACID, VENOUS: 1.82 mmol/L (ref 0.5–2.0)

## 2015-05-06 LAB — I-STAT TROPONIN, ED: Troponin i, poc: 0.02 ng/mL (ref 0.00–0.08)

## 2015-05-06 LAB — CBG MONITORING, ED: Glucose-Capillary: 102 mg/dL — ABNORMAL HIGH (ref 65–99)

## 2015-05-06 LAB — BRAIN NATRIURETIC PEPTIDE: B Natriuretic Peptide: 602.2 pg/mL — ABNORMAL HIGH (ref 0.0–100.0)

## 2015-05-06 MED ORDER — ACETAMINOPHEN 500 MG PO TABS
500.0000 mg | ORAL_TABLET | Freq: Four times a day (QID) | ORAL | Status: DC | PRN
  Administered 2015-05-06 – 2015-05-09 (×3): 1000 mg via ORAL
  Filled 2015-05-06 (×4): qty 2

## 2015-05-06 MED ORDER — DOPAMINE-DEXTROSE 3.2-5 MG/ML-% IV SOLN
0.0000 ug/kg/min | Freq: Once | INTRAVENOUS | Status: AC
  Administered 2015-05-06: 5 ug/kg/min via INTRAVENOUS

## 2015-05-06 MED ORDER — DOPAMINE-DEXTROSE 3.2-5 MG/ML-% IV SOLN
INTRAVENOUS | Status: AC
  Filled 2015-05-06: qty 250

## 2015-05-06 MED ORDER — ONDANSETRON HCL 4 MG/2ML IJ SOLN
4.0000 mg | Freq: Once | INTRAMUSCULAR | Status: AC
  Administered 2015-05-06: 4 mg via INTRAVENOUS
  Filled 2015-05-06: qty 2

## 2015-05-06 MED ORDER — SODIUM CHLORIDE 0.9 % IV SOLN
1.0000 g | Freq: Once | INTRAVENOUS | Status: AC
  Administered 2015-05-06: 1 g via INTRAVENOUS
  Filled 2015-05-06: qty 10

## 2015-05-06 MED ORDER — GLUCAGON HCL RDNA (DIAGNOSTIC) 1 MG IJ SOLR
3.0000 mg | Freq: Once | INTRAMUSCULAR | Status: AC
  Administered 2015-05-06: 3 mg via INTRAVENOUS
  Filled 2015-05-06: qty 3

## 2015-05-06 MED ORDER — HYDROCORTISONE ACETATE 25 MG RE SUPP: 25.0000 mg | Freq: Two times a day (BID) | RECTAL | Status: DC

## 2015-05-06 MED ORDER — ATROPINE SULFATE 0.1 MG/ML IJ SOLN
0.5000 mg | Freq: Once | INTRAMUSCULAR | Status: AC
  Administered 2015-05-06: 0.5 mg via INTRAVENOUS

## 2015-05-06 MED ORDER — HEPARIN SODIUM (PORCINE) 5000 UNIT/ML IJ SOLN
5000.0000 [IU] | Freq: Three times a day (TID) | INTRAMUSCULAR | Status: DC
  Administered 2015-05-06 – 2015-05-14 (×24): 5000 [IU] via SUBCUTANEOUS
  Filled 2015-05-06 (×25): qty 1

## 2015-05-06 MED ORDER — ACETAMINOPHEN 325 MG PO TABS
650.0000 mg | ORAL_TABLET | ORAL | Status: DC | PRN
Start: 2015-05-06 — End: 2015-05-14
  Administered 2015-05-07 – 2015-05-09 (×2): 650 mg via ORAL
  Filled 2015-05-06 (×2): qty 2

## 2015-05-06 MED ORDER — PANTOPRAZOLE SODIUM 40 MG PO TBEC
40.0000 mg | DELAYED_RELEASE_TABLET | Freq: Every day | ORAL | Status: DC
  Administered 2015-05-06 – 2015-05-14 (×9): 40 mg via ORAL
  Filled 2015-05-06 (×9): qty 1

## 2015-05-06 MED ORDER — SODIUM CHLORIDE 0.9 % IV BOLUS (SEPSIS)
500.0000 mL | Freq: Once | INTRAVENOUS | Status: AC
Start: 2015-05-06 — End: 2015-05-06
  Administered 2015-05-06: 500 mL via INTRAVENOUS

## 2015-05-06 MED ORDER — SIMVASTATIN 20 MG PO TABS
20.0000 mg | ORAL_TABLET | Freq: Every day | ORAL | Status: DC
  Administered 2015-05-06 – 2015-05-13 (×8): 20 mg via ORAL
  Filled 2015-05-06 (×9): qty 1

## 2015-05-06 MED ORDER — AMOXICILLIN 500 MG PO CAPS
500.0000 mg | ORAL_CAPSULE | Freq: Two times a day (BID) | ORAL | Status: AC
  Administered 2015-05-06 – 2015-05-10 (×9): 500 mg via ORAL
  Filled 2015-05-06 (×9): qty 1

## 2015-05-06 MED ORDER — DOPAMINE-DEXTROSE 3.2-5 MG/ML-% IV SOLN
0.0000 ug/kg/min | INTRAVENOUS | Status: DC
  Administered 2015-05-07: 14 ug/kg/min via INTRAVENOUS
  Filled 2015-05-06: qty 250

## 2015-05-06 MED ORDER — ATROPINE SULFATE 0.1 MG/ML IJ SOLN
0.5000 mg | Freq: Once | INTRAMUSCULAR | Status: AC
  Administered 2015-05-06: 0.5 mg via INTRAVENOUS
  Filled 2015-05-06: qty 10

## 2015-05-06 MED ORDER — DOPAMINE-DEXTROSE 3.2-5 MG/ML-% IV SOLN
0.0000 ug/kg/min | Freq: Once | INTRAVENOUS | Status: AC
Start: 2015-05-06 — End: 2015-05-06

## 2015-05-06 MED ORDER — ASPIRIN EC 81 MG PO TBEC
81.0000 mg | DELAYED_RELEASE_TABLET | Freq: Every day | ORAL | Status: DC
  Administered 2015-05-06 – 2015-05-14 (×8): 81 mg via ORAL
  Filled 2015-05-06 (×9): qty 1

## 2015-05-06 MED ORDER — CETYLPYRIDINIUM CHLORIDE 0.05 % MT LIQD
7.0000 mL | Freq: Two times a day (BID) | OROMUCOSAL | Status: DC
  Administered 2015-05-06 – 2015-05-14 (×11): 7 mL via OROMUCOSAL

## 2015-05-06 MED ORDER — ONDANSETRON HCL 4 MG/2ML IJ SOLN: 4.0000 mg | Freq: Four times a day (QID) | INTRAMUSCULAR | Status: DC | PRN

## 2015-05-06 NOTE — ED Notes (Signed)
Attempted report 

## 2015-05-06 NOTE — ED Notes (Signed)
MD at bedside. 

## 2015-05-06 NOTE — ED Notes (Signed)
Bear hugger placed on pt

## 2015-05-06 NOTE — ED Notes (Signed)
trish with cardiology at bedside

## 2015-05-06 NOTE — ED Notes (Signed)
Pt states that she has had trouble breathing recently. Pt also reports generalized weakness. Pt oriented x4

## 2015-05-06 NOTE — ED Notes (Signed)
Pt placed on 4l of oxyegen

## 2015-05-06 NOTE — ED Notes (Signed)
Pt CBG, 102. Nurse was notified. 

## 2015-05-06 NOTE — ED Notes (Signed)
Dr. Ellyn Hack made aware of need to titrate dopamine up to 59mcg/kg/min

## 2015-05-06 NOTE — H&P (Signed)
CARDIOLOGY ADMISSION HISTORY AND PHYSICAL NOTE.  NAME:  Jacqueline [name]   MRN: [id number] DOB:  [birth year]   ADMIT DATE: 05/06/2015  Reason for Admission: Symptomatic bradycardia and hypotension  Requesting ED Provider: Jeannett Senior, PA-C   Primary Cardiologist: Croitoru Primary Care Physician: Jamal Collin. Hensel, MD  HPI: This is a 81 y.o. female with a past medical history significant for CAD s/p coronary artery bypass surgery (LIMA to LAD and SVG to diagonal 2, Dr. Servando Snare; f/u cath 08/2014 atretic LIMA but good distal flow via the other conduits) and has normal left ventricular systolic function and a relatively recent normal nuclear perfusion study. She had highly symptomatic atrial fibrillation rapid ventricular response in August 2015 and underwent TEE-guided cardioversion followed by treatment with Eliquis. She does not have a history of stroke or TIA. ELIQUIS was stopped due to GI bleed in July. Has not been restarted as the patient wasn't not interested in restarting.  Her beta blocker doses of any increased over the last several months with the most recent increase being October 5th for A. fib RVR (increased to metoprolol 100 mg twice a day by her PCP) -- this was in response to her recent hospitalization where she was diagnosed with polymyalgia rheumatica/acute inflammatory polyarthralgia. The following day, her enalapril dose was reduced. She was also noted to have enterococcal UTI in the hospital started on Keflex - converted to amoxicillin.   she presented emergency room today with the report of weakness and dyspnea for several days. His also noted increased fatigue. She recently had her blood pressure medications adjusted with metoprolol increased, enalapril decreased in morphine stopped. She again feels notably worse yesterday (10/9) and noted that her blood pressure was low in the 70s with heart rates in the 50s. When she continued to feel poorly this morning, she came to the  emergency room.  Initial evaluation on telemetry emergency room showed junk list K per rhythm with sinus bradycardia. Rates were as low as low 40s. She appealed pale, lethargic with minimal responsiveness. She is mildly hypoxic requiring oxygen, likely related to decreased mental status. She was also noted to be hypotensive with blood pressures in the 50s-70s mmHg. Upon my initial evaluation she had been given glucagon and 1 ampule of atropine along with a 500 mL bolus. He remained somewhat lethargic, but heart rate was showing now signs of having sinus rhythm/sinus bradycardia with intermittent junctional escape rhythm with rates in the 50s.  She was started on dopamine with increased dose up to 20 mg to counteract effect of beta blocker.  By the, left emergency room, her heart rate was stable in the 60-70 bpm range with blood pressures in the high 90s. Plan was for her to be admitted to the CCU for monitoring.  PMHx:  CARDIAC HISTORY:   2-D echo February 2016:  EF 55-60% and mild LVH. No RWMA.  Grade 2 DD (pseudo-normal)   Cardiac Catheterization August 2015: Proximal LAD 90%. LIMA-LAD atretic. SVG-D2 patent with retrograde filling of the LAD system. EF 55-60%. Minimal lateral HK.  Past Medical History  Diagnosis Date  . Hypertension   . CAD (coronary artery disease)     a. s/p CABG x 2 (LIMA->LAD, VG->Diag);  b. 08/2014 Cath: LM nl, LAD 90p, LCX nl, RCA nl/dominant, LIMA->LAD atretic, VG->D2 patent w/ retrograde filling of LAD, EF 55-60%-->Med Rx.  Marland Kitchen Hyperlipemia   . Insomnia   . Diverticulitis 02/21/2012  . GERD (gastroesophageal reflux disease)   . Atrial fibrillation  with rapid ventricular response ([place]) 02/2014    a. CHA2DS2VASc = 6 -> on eliquis;  b. 02/2014 s/p DCCV;  c. 08/2014 Echo: EF 55-60%, Gr 2 DD, mild MR, triv AI.  Marland Kitchen Arthritis     back   Past Surgical History  Procedure Laterality Date  . Coronary artery bypass graft  2006    off-pump bypass surgery with LIMA to the LAD  and SVG to the second diagonal artery ( performed by Dr Servando Snare)   . Tee without cardioversion N/A 02/28/2014    Procedure: TRANSESOPHAGEAL ECHOCARDIOGRAM (TEE);  Surgeon: Sanda Klein, MD;  Location: [facility];  Service: Cardiovascular;  Laterality: N/A;  . Cardioversion N/A 02/28/2014    Procedure: CARDIOVERSION;  Surgeon: Sanda Klein, MD;  Location: [facility];  Service: Cardiovascular;  Laterality: N/A;  . Cataract extraction Bilateral 2014    with lens implanted  . Abdominal hysterectomy  1975  . Rotator cuff repair Bilateral     r-1999, l- 2005  . Joint replacement Bilateral     L-2004, R-2006  . Total knee arthroplasty Right 07/02/2014    Procedure: Resection of Infected Right Total Knee Arthroplasty with placement antibiotic spacer;  Surgeon: Mauri Pole, MD;  Location: [facility];  Service: Orthopedics;  Laterality: Right;  . Left heart catheterization with coronary/graft angiogram N/A 09/17/2014    Procedure: LEFT HEART CATHETERIZATION WITH Beatrix Fetters;  Surgeon: Troy Sine, MD;  Location: [facility] CATH LAB;  Service: Cardiovascular;  Laterality: N/A;  . Reimplantation of total knee Right 10/08/2014    Procedure: REIMPLANTATION OF RIGHT TOTAL KNEE ARTHROPLASTY WITH REMOVAL OF ANTIBIOTIC SPACER;  Surgeon: Paralee Cancel, MD;  Location: [facility];  Service: Orthopedics;  Laterality: Right;  . Colonoscopy N/A 02/19/2015    Procedure: COLONOSCOPY;  Surgeon: Ladene Artist, MD;  [facility] ENDOSCOPY;  Service: Endoscopy;  Laterality: N/A;    FAMHx: Family History  Problem Relation Age of Onset  . Parkinson's disease Mother     SOCHx:  reports that she has never smoked. She has never used smokeless tobacco. She reports that she does not drink alcohol or use illicit drugs.  ALLERGIES: Allergies  Allergen Reactions  . Atorvastatin Other (See Comments)    Myalgias in legs  . Crestor [Rosuvastatin] Other (See Comments)    Myalgias in legs  . Morphine And Related Other  (See Comments)    "Crazy thoughts, severe headache, sick"  . Prednisone     On two occasions has caused A fib    ROS: Review of Systems  Constitutional: Positive for malaise/fatigue.  HENT: Negative for nosebleeds.   Respiratory: Negative for cough and shortness of breath.   Cardiovascular: Positive for palpitations (Before having metoprolol increased). Negative for chest pain and claudication.  Gastrointestinal: Negative for blood in stool and melena.  Musculoskeletal: Positive for myalgias (diffuse  myalgias and arthralgias).  Neurological: Positive for dizziness and weakness.  Endo/Heme/Allergies: Bruises/bleeds easily.  Psychiatric/Behavioral: Negative.   All other systems reviewed and are negative.   HOME MEDICATIONS: Prescriptions prior to admission  Medication Sig Dispense Refill Last Dose  . acetaminophen (TYLENOL) 500 MG tablet Take 500-1,000 mg by mouth every 6 (six) hours as needed for moderate pain or headache.   Past Month at Unknown time  . alendronate (FOSAMAX) 70 MG tablet Take 1 tablet (70 mg total) by mouth once a week. Take with a full glass of water on an empty stomach, remain upright (Patient taking differently: Take 70 mg by mouth once a week.  Take with a full glass of water on an empty stomach, remain upright  Saturday) 12 tablet 3 Past Week at Unknown time  . ALPRAZolam (XANAX) 0.5 MG tablet Take 1 tablet (0.5 mg total) by mouth 2 (two) times daily as needed for anxiety. 60 tablet 5 05/05/2015 at Unknown time  . amoxicillin (AMOXIL) 500 MG capsule Take 1 capsule (500 mg total) by mouth 3 (three) times daily. 21 capsule 0 05/05/2015 at Unknown time  . aspirin EC 81 MG tablet Take 81 mg by mouth daily.   05/05/2015 at Unknown time  . celecoxib (CELEBREX) 100 MG capsule Take 1 capsule (100 mg total) by mouth 2 (two) times daily. 60 capsule 3 05/05/2015 at Unknown time  . Cholecalciferol (VITAMIN D-3) 1000 UNITS CAPS Take 2,000 Units by mouth 2 (two) times daily.    05/05/2015 at Unknown time  . diclofenac sodium (VOLTAREN) 1 % GEL Apply 2 g topically 3 (three) times daily as needed (pain).   0 Past Month at Unknown time  . diphenhydrAMINE (BENADRYL) 25 MG tablet Take 50 mg by mouth 2 (two) times daily as needed for allergies (eye allergies).    Past Month at Unknown time  . enalapril (VASOTEC) 10 MG tablet Take 1 tablet (10 mg total) by mouth daily. (Patient taking differently: Take 10 mg by mouth 2 (two) times daily. )   05/06/2015 at Unknown time  . furosemide (LASIX) 20 MG tablet Take 1 tablet (20 mg total) by mouth daily. (Patient taking differently: Take 20 mg by mouth daily as needed for fluid. ) 90 tablet 3 Past Week at Unknown time  . HYDROcodone-acetaminophen (NORCO/VICODIN) 5-325 MG tablet Take 1-2 tablets by mouth every 4 (four) hours as needed for severe pain. Please do not take at the same time as Tramadol. 15 tablet 0 Past Month at Unknown time  . Iron-Vitamins (GERITOL COMPLETE PO) Take 1 tablet by mouth daily.   05/06/2015 at Unknown time  . metoprolol (LOPRESSOR) 100 MG tablet Take 1 tablet (100 mg total) by mouth 2 (two) times daily. (Patient taking differently: Take 50 mg by mouth 2 (two) times daily. ) 180 tablet 3 05/06/2015 at 0800  . nitroGLYCERIN (NITROSTAT) 0.4 MG SL tablet Place 1 tablet (0.4 mg total) under the tongue every 5 (five) minutes as needed for chest pain. 25 tablet 2 unk at unk  . Omega-3 Fatty Acids (FISH OIL) 1000 MG CAPS Take 1-2 capsules by mouth 2 (two) times daily. Takes one in the morning and two capsules at night.   05/05/2015 at Unknown time  . omeprazole (PRILOSEC) 20 MG capsule Take 1 capsule (20 mg total) by mouth daily. 90 capsule 3 05/05/2015 at Unknown time  . polyethylene glycol (MIRALAX / GLYCOLAX) packet Take 17 g by mouth 2 (two) times daily. (Patient taking differently: Take 17 g by mouth daily as needed for moderate constipation. ) 14 each 0 Past Month at Unknown time  . Probiotic Product (PROBIOTIC PO) Take 1  capsule by mouth daily.   05/05/2015 at Unknown time  . rOPINIRole (REQUIP) 0.25 MG tablet Take 1 tablet (0.25 mg total) by mouth 3 (three) times daily. 270 tablet 3 05/05/2015 at Unknown time  . simvastatin (ZOCOR) 40 MG tablet Take 0.5 tablets (20 mg total) by mouth at bedtime. 45 tablet 3 05/05/2015 at Unknown time  . tiZANidine (ZANAFLEX) 4 MG capsule Take 4 mg by mouth at bedtime as needed for muscle spasms.   Past Week at Unknown time  .  traMADol (ULTRAM) 50 MG tablet TAKE 1 TABLET BY MOUTH TWICE A DAY (Patient taking differently: TAKE 1 TABLET BY MOUTH TWICE A DAY PRN PAIN) 60 tablet 5 Past Week at Unknown time  . traZODone (DESYREL) 50 MG tablet Take 1 tablet (50 mg total) by mouth at bedtime. 90 tablet 3 05/05/2015 at Unknown time  . vitamin B-12 (CYANOCOBALAMIN) 100 MCG tablet Take 2,000 mcg by mouth 2 (two) times daily.   05/05/2015 at Unknown time    HOSPITAL MEDICATIONS: Prior to Admission medications   Medication Sig Start Date End Date Taking? Authorizing Provider  acetaminophen (TYLENOL) 500 MG tablet Take 500-1,000 mg by mouth every 6 (six) hours as needed for moderate pain or headache.   Yes Historical Provider, MD  alendronate (FOSAMAX) 70 MG tablet Take 1 tablet (70 mg total) by mouth once a week. Take with a full glass of water on an empty stomach, remain upright Patient taking differently: Take 70 mg by mouth once a week. Take with a full glass of water on an empty stomach, remain upright  Saturday 03/27/15  Yes [name], MD  ALPRAZolam Duanne Moron) 0.5 MG tablet Take 1 tablet (0.5 mg total) by mouth 2 (two) times daily as needed for anxiety. 11/19/14  Yes [name], MD  amoxicillin (AMOXIL) 500 MG capsule Take 1 capsule (500 mg total) by mouth 3 (three) times daily. 05/01/15  Yes [name], MD  aspirin EC 81 MG tablet Take 81 mg by mouth daily.   Yes Historical Provider, MD  celecoxib (CELEBREX) 100 MG capsule Take 1 capsule (100 mg total) by mouth 2 (two) times  daily. 05/02/15  Yes [name], MD  Cholecalciferol (VITAMIN D-3) 1000 UNITS CAPS Take 2,000 Units by mouth 2 (two) times daily.   Yes Historical Provider, MD  diclofenac sodium (VOLTAREN) 1 % GEL Apply 2 g topically 3 (three) times daily as needed (pain).  03/04/15  Yes Historical Provider, MD  diphenhydrAMINE (BENADRYL) 25 MG tablet Take 50 mg by mouth 2 (two) times daily as needed for allergies (eye allergies).    Yes Historical Provider, MD  enalapril (VASOTEC) 10 MG tablet Take 1 tablet (10 mg total) by mouth daily. Patient taking differently: Take 10 mg by mouth 2 (two) times daily.  05/02/15  Yes [name], MD  furosemide (LASIX) 20 MG tablet Take 1 tablet (20 mg total) by mouth daily. Patient taking differently: Take 20 mg by mouth daily as needed for fluid.  01/30/15  Yes Mihai Croitoru, MD  HYDROcodone-acetaminophen (NORCO/VICODIN) 5-325 MG tablet Take 1-2 tablets by mouth every 4 (four) hours as needed for severe pain. Please do not take at the same time as Tramadol. 04/25/15  Yes Sela Hua, MD  Iron-Vitamins (GERITOL COMPLETE PO) Take 1 tablet by mouth daily.   Yes Historical Provider, MD  metoprolol (LOPRESSOR) 100 MG tablet Take 1 tablet (100 mg total) by mouth 2 (two) times daily. Patient taking differently: Take 50 mg by mouth 2 (two) times daily.  05/02/15  Yes [name], MD  nitroGLYCERIN (NITROSTAT) 0.4 MG SL tablet Place 1 tablet (0.4 mg total) under the tongue every 5 (five) minutes as needed for chest pain. 03/21/14  Yes Mihai Croitoru, MD  Omega-3 Fatty Acids (FISH OIL) 1000 MG CAPS Take 1-2 capsules by mouth 2 (two) times daily. Takes one in the morning and two capsules at night.   Yes Historical Provider, MD  omeprazole (PRILOSEC) 20 MG capsule Take 1  capsule (20 mg total) by mouth daily. 03/01/15  Yes [name], MD  polyethylene glycol (MIRALAX / GLYCOLAX) packet Take 17 g by mouth 2 (two) times daily. Patient taking differently: Take 17 g by mouth  daily as needed for moderate constipation.  10/11/14  Yes Danae Orleans, PA-C  Probiotic Product (PROBIOTIC PO) Take 1 capsule by mouth daily.   Yes Historical Provider, MD  rOPINIRole (REQUIP) 0.25 MG tablet Take 1 tablet (0.25 mg total) by mouth 3 (three) times daily. 01/03/15  Yes [name], MD  simvastatin (ZOCOR) 40 MG tablet Take 0.5 tablets (20 mg total) by mouth at bedtime. 01/03/15  Yes [name], MD  tiZANidine (ZANAFLEX) 4 MG capsule Take 4 mg by mouth at bedtime as needed for muscle spasms.   Yes Historical Provider, MD  traMADol (ULTRAM) 50 MG tablet TAKE 1 TABLET BY MOUTH TWICE A DAY Patient taking differently: TAKE 1 TABLET BY MOUTH TWICE A DAY PRN PAIN 01/03/15  Yes [name], MD  traZODone (DESYREL) 50 MG tablet Take 1 tablet (50 mg total) by mouth at bedtime. 01/03/15  Yes [name], MD  vitamin B-12 (CYANOCOBALAMIN) 100 MCG tablet Take 2,000 mcg by mouth 2 (two) times daily.   Yes Historical Provider, MD     VITALS: on initiayly evaluation - blood pressure was roughly 60/40 mmHg, with heart rate in the 50s. Filed Vitals:   05/06/15 1530 05/06/15 1545 05/06/15 1600 05/06/15 1615  BP: 113/69 116/79 141/113   Pulse: 67 64 66 64  Temp:      TempSrc:      Resp: 22 24 24 23   SpO2: 97% 97% 100% 94%   Blood pressure 141/113, pulse 64, temperature 97.7 F (36.5 C), temperature source Oral, resp. rate 23, SpO2 94 %. PHYSICAL EXAM:  on initial evaluation, she was very lethargic and barely open her eyes. She did however respond to questions and answer questions. Following improved blood pressure and heart rate, she became more responsive with wide-open eyes answering questions properly.  General appearance: cooperative, appears stated age, no distress, pale, slowed mentation and Able to respond to commands. Arousable but not readily alert and oriented. Neck: no adenopathy, no carotid bruit and no JVD Lungs: clear to auscultation bilaterally, normal percussion  bilaterally and Diminished respiratory effort Heart: Borderline bradycardic. Normal S1 and S2 with occasionally split S2. Some ectopy. No obvious M/R/G. There may be a soft HSM at the middle sternal border. Abdomen: soft, non-tender; bowel sounds normal; no masses,  no organomegaly Extremities: extremities normal, atraumatic, no cyanosis or edema Pulses: Diminished, weak bilaterally related to borderline blood pressures. Skin: Pale, but no obvious rashes or lesions. Neurologic: Mental status: alertness: lethargic, orientation: time, date, person, place, affect: normal Cranial nerves: normal  LABS: Results for orders placed or performed during the hospital encounter of 05/06/15 (from the past 24 hour(s))  CBC with Differential     Status: Abnormal   Collection Time: 05/06/15  9:13 AM  Result Value Ref Range   WBC 8.6 4.0 - 10.5 K/uL   RBC 2.86 (L) 3.87 - 5.11 MIL/uL   Hemoglobin 9.1 (L) 12.0 - 15.0 g/dL   HCT 26.8 (L) 36.0 - 46.0 %   MCV 93.7 78.0 - 100.0 fL   MCH 31.8 26.0 - 34.0 pg   MCHC 34.0 30.0 - 36.0 g/dL   RDW 15.1 11.5 - 15.5 %   Platelets 182 150 - 400 K/uL   Neutrophils Relative % 75 %  Neutro Abs 6.4 1.7 - 7.7 K/uL   Lymphocytes Relative 16 %   Lymphs Abs 1.4 0.7 - 4.0 K/uL   Monocytes Relative 7 %   Monocytes Absolute 0.6 0.1 - 1.0 K/uL   Eosinophils Relative 2 %   Eosinophils Absolute 0.2 0.0 - 0.7 K/uL   Basophils Relative 0 %   Basophils Absolute 0.0 0.0 - 0.1 K/uL  Comprehensive metabolic panel     Status: Abnormal   Collection Time: 05/06/15  9:13 AM  Result Value Ref Range   Sodium 137 135 - 145 mmol/L   Potassium 4.7 3.5 - 5.1 mmol/L   Chloride 105 101 - 111 mmol/L   CO2 22 22 - 32 mmol/L   Glucose, Bld 134 (H) 65 - 99 mg/dL   BUN 48 (H) 6 - 20 mg/dL   Creatinine, Ser 2.44 (H) 0.44 - 1.00 mg/dL   Calcium 8.7 (L) 8.9 - 10.3 mg/dL   Total Protein 5.9 (L) 6.5 - 8.1 g/dL   Albumin 2.9 (L) 3.5 - 5.0 g/dL   AST 81 (H) 15 - 41 U/L   ALT 146 (H) 14 - 54 U/L     Alkaline Phosphatase 78 38 - 126 U/L   Total Bilirubin 0.7 0.3 - 1.2 mg/dL   GFR calc non Af Amer 17 (L) >60 mL/min   GFR calc Af Amer 20 (L) >60 mL/min   Anion gap 10 5 - 15  I-Stat Troponin, ED (not at [facility])     Status: None   Collection Time: 05/06/15  9:21 AM  Result Value Ref Range   Troponin i, poc 0.02 0.00 - 0.08 ng/mL   Comment 3          I-Stat CG4 Lactic Acid, ED     Status: None   Collection Time: 05/06/15  9:23 AM  Result Value Ref Range   Lactic Acid, Venous 1.82 0.5 - 2.0 mmol/L  I-Stat Chem 8, ED     Status: Abnormal   Collection Time: 05/06/15  9:23 AM  Result Value Ref Range   Sodium 137 135 - 145 mmol/L   Potassium 4.6 3.5 - 5.1 mmol/L   Chloride 105 101 - 111 mmol/L   BUN 45 (H) 6 - 20 mg/dL   Creatinine, Ser 2.50 (H) 0.44 - 1.00 mg/dL   Glucose, Bld 129 (H) 65 - 99 mg/dL   Calcium, Ion 1.18 1.13 - 1.30 mmol/L   TCO2 19 0 - 100 mmol/L   Hemoglobin 10.2 (L) 12.0 - 15.0 g/dL   HCT 30.0 (L) 36.0 - 46.0 %  POC CBG, ED     Status: Abnormal   Collection Time: 05/06/15  9:50 AM  Result Value Ref Range   Glucose-Capillary 102 (H) 65 - 99 mg/dL  MRSA PCR Screening     Status: None   Collection Time: 05/06/15 12:43 PM  Result Value Ref Range   MRSA by PCR NEGATIVE NEGATIVE  Urinalysis, Routine w reflex microscopic (not at [facility])     Status: Abnormal   Collection Time: 05/06/15  4:05 PM  Result Value Ref Range   Color, Urine YELLOW YELLOW   APPearance CLOUDY (A) CLEAR   Specific Gravity, Urine 1.020 1.005 - 1.030   pH 5.0 5.0 - 8.0   Glucose, UA NEGATIVE NEGATIVE mg/dL   Hgb urine dipstick NEGATIVE NEGATIVE   Bilirubin Urine NEGATIVE NEGATIVE   Ketones, ur NEGATIVE NEGATIVE mg/dL   Protein, ur NEGATIVE NEGATIVE mg/dL   Urobilinogen, UA 0.2 0.0 - 1.0  mg/dL   Nitrite NEGATIVE NEGATIVE   Leukocytes, UA SMALL (A) NEGATIVE  Urine microscopic-add on     Status: Abnormal   Collection Time: 05/06/15  4:05 PM  Result Value Ref Range   Squamous Epithelial / LPF  FEW (A) RARE   WBC, UA 3-6 <3 WBC/hpf   RBC / HPF 0-2 <3 RBC/hpf   Bacteria, UA FEW (A) RARE    IMAGING: Dg Chest Portable 1 View  05/06/2015   CLINICAL DATA:  Difficulty breathing and weakness. Atrial fibrillation  EXAM: PORTABLE CHEST 1 VIEW  COMPARISON:  April 23, 2015  FINDINGS: There is minimal scarring in the left base. The lungs elsewhere are clear. Heart size and pulmonary vascularity are normal. No adenopathy. Patient is status post coronary artery bypass grafting. There is postoperative change in the right shoulder. Degenerative changes noted in each shoulder.  IMPRESSION: Minimal scarring left base.  No edema or consolidation.   Electronically Signed   By: Lowella Grip III M.D.   On: 05/06/2015 09:50   EKG: Sinus bradycardia, rate 51. Normal axis, intervals and voltage. T-wave inversion in lead aVF (not new finding) - compared to EKG from 05/01/2015, sinus bradycardia has replaced into fibrillation with RVR.  IMPRESSION: Principal Problem:   Tachy-brady syndrome (HCC) Active Problems:   Chronic kidney disease, stage 2, mildly decreased GFR   PAF (paroxysmal atrial fibrillation) (HCC)   Symptomatic bradycardia   Shock circulatory ([place])- due to over medication   Acute on chronic renal insufficiency (HCC)   CAD- s/p CABG   HYPERTENSION, BENIGN SYSTEMIC   CHF NYHA class II (symptoms with moderately strenuous activities) ([place])   Hypotension   Bradycardia   Arterial hypotension    RECOMMENDATION:  Admit to CCU, continue dopamine at 20 g/kilogram/minute to maintain heart rate greater than 55 bpm and blood pressure greater than 100 mmHg.  Dr. [name] is not in town, therefore we will consult electrophysiology for consideration of possible need for pacemaker placement for tachybradycardia syndrome. -- Per cursory discussion as a curbside, consideration would be to either consider anti-arrhythmic agent with less beta-blockade versus simply consider an tachybradycardia  and placing pacemaker to allow for additional rate control.  She is not currently on anticoagulation because of prior GI bleeding history. As she is now in sinus rhythm, I will continue to hold ELIQUIS especially in light of potential need for procedures. This can be discussed prior to discharge.  Her blood pressures have improved, with shock resolving. Hopefully we can wean the dopamine off as we counteract effect of the beta blocker. With her renal insufficiency however metoprolol may take a while longer to recover.   Neurologic status is improving with improved blood pressure. I hope that her renal function will also improve with increased blood pressure/cardiac output    No suggestion of any active anginal symptoms or ischemic findings. - Would not cycle cardiac enzymes especially in light of renal insufficiency.  In light of no evidence of heart failure symptoms, we'll hold off on echocardiogram until she has improved her baseline pressures.  Hold antihypertensives for now.  Continue statin and aspirin.  Continue amoxicillin to complete 10 day course for UTI -- normal white blood cell count and no fever  She has had some borderline anemia. Initial blood count shows reduced hemoglobin that is improved with repeat lab draw.  Time Spent Directly with Patient: 45 minutes  Mahala Rommel, Leonie Green, M.D., M.S. Interventional Cardiologist   Pager # [phone]

## 2015-05-06 NOTE — ED Provider Notes (Signed)
CSN: [id number]     Arrival date & time 05/06/15  [id number] History   First MD Initiated Contact with Patient 05/06/15 [phone]     Chief Complaint  Patient presents with  . Shortness of Breath     (Consider location/radiation/quality/duration/timing/severity/associated sxs/prior Treatment) HPI Jacqueline [name] is a 81 y.o. female history hypertension, coronary disease, A. fib, presents to emergency department with her son complaining of generalized weakness. Patient is coming from home. Patient reports weakness, shortness of breath for several days. She states that she has not been feeling well for several weeks, since her recent hospitalization during which she was diagnosed with rheumatoid arthritis and UTI. She finished antibiotics. She says that followed up with her primary care doctor, 4 days ago, and had some medications adjusted for A. fib with RVR. Her metoprolol was increased, enalapril was decreased, morphine was stopped. She was also started on Celebrex for her pain, since she was unable to tolerate prednisone. Patient states that yesterday she felt worse, she has been checking her blood pressure and heart rate and states that her blood pressure yesterday was 70 systolic which is unusual for her and her heart rate was in the 50s. This morning pt felt worse and her son brought her to ED  Past Medical History  Diagnosis Date  . Hypertension   . CAD (coronary artery disease)     a. s/p CABG x 2 (LIMA->LAD, VG->Diag);  b. 08/2014 Cath: LM nl, LAD 90p, LCX nl, RCA nl/dominant, LIMA->LAD atretic, VG->D2 patent w/ retrograde filling of LAD, EF 55-60%-->Med Rx.  Marland Kitchen Hyperlipemia   . Insomnia   . Diverticulitis 02/21/2012  . GERD (gastroesophageal reflux disease)   . Atrial fibrillation with rapid ventricular response ([place]) 02/2014    a. CHA2DS2VASc = 6 -> on eliquis;  b. 02/2014 s/p DCCV;  c. 08/2014 Echo: EF 55-60%, Gr 2 DD, mild MR, triv AI.  Marland Kitchen Arthritis     back   Past Surgical History  Procedure  Laterality Date  . Coronary artery bypass graft  2006    off-pump bypass surgery with LIMA to the LAD and SVG to the second diagonal artery ( performed by Dr Servando Snare)   . Tee without cardioversion N/A 02/28/2014    Procedure: TRANSESOPHAGEAL ECHOCARDIOGRAM (TEE);  Surgeon: Sanda Klein, MD;  Location: [place];  Service: Cardiovascular;  Laterality: N/A;  . Cardioversion N/A 02/28/2014    Procedure: CARDIOVERSION;  Surgeon: Sanda Klein, MD;  Location: [facility];  Service: Cardiovascular;  Laterality: N/A;  . Cataract extraction Bilateral 2014    with lens implanted  . Abdominal hysterectomy  1975  . Rotator cuff repair Bilateral     r-1999, l- 2005  . Joint replacement Bilateral     L-2004, R-2006  . Total knee arthroplasty Right 07/02/2014    Procedure: Resection of Infected Right Total Knee Arthroplasty with placement antibiotic spacer;  Surgeon: Mauri Pole, MD;  Location: [facility];  Service: Orthopedics;  Laterality: Right;  . Left heart catheterization with coronary/graft angiogram N/A 09/17/2014    Procedure: LEFT HEART CATHETERIZATION WITH Beatrix Fetters;  Surgeon: Troy Sine, MD;  Location: [facility] CATH LAB;  Service: Cardiovascular;  Laterality: N/A;  . Reimplantation of total knee Right 10/08/2014    Procedure: REIMPLANTATION OF RIGHT TOTAL KNEE ARTHROPLASTY WITH REMOVAL OF ANTIBIOTIC SPACER;  Surgeon: Paralee Cancel, MD;  Location: [facility];  Service: Orthopedics;  Laterality: Right;  . Colonoscopy N/A 02/19/2015    Procedure: COLONOSCOPY;  Surgeon: Ladene Artist,  MD;  Location: [facility];  Service: Endoscopy;  Laterality: N/A;   Family History  Problem Relation Age of Onset  . Parkinson's disease Mother    Social History  Substance Use Topics  . Smoking status: Never Smoker   . Smokeless tobacco: Never Used  . Alcohol Use: No   OB History    No data available     Review of Systems  Constitutional: Positive for fatigue. Negative for fever and chills.   Respiratory: Negative for cough, chest tightness and shortness of breath.   Cardiovascular: Negative for chest pain, palpitations and leg swelling.  Gastrointestinal: Negative for nausea, vomiting, abdominal pain and diarrhea.  Musculoskeletal: Negative for myalgias, arthralgias, neck pain and neck stiffness.  Skin: Negative for rash.  Neurological: Positive for weakness. Negative for dizziness and headaches.  All other systems reviewed and are negative.     Allergies  Atorvastatin; Crestor; Morphine and related; and Prednisone  Home Medications   Prior to Admission medications   Medication Sig Start Date End Date Taking? Authorizing Provider  acetaminophen (TYLENOL) 500 MG tablet Take 500-1,000 mg by mouth every 6 (six) hours as needed for moderate pain or headache.    Historical Provider, MD  alendronate (FOSAMAX) 70 MG tablet Take 1 tablet (70 mg total) by mouth once a week. Take with a full glass of water on an empty stomach, remain upright Patient taking differently: Take 70 mg by mouth once a week. Take with a full glass of water on an empty stomach, remain upright  Saturday 03/27/15   [name], MD  ALPRAZolam Duanne Moron) 0.5 MG tablet Take 1 tablet (0.5 mg total) by mouth 2 (two) times daily as needed for anxiety. 11/19/14   [name], MD  amoxicillin (AMOXIL) 500 MG capsule Take 1 capsule (500 mg total) by mouth 3 (three) times daily. 05/01/15   [name], MD  aspirin EC 81 MG tablet Take 81 mg by mouth daily.    Historical Provider, MD  celecoxib (CELEBREX) 100 MG capsule Take 1 capsule (100 mg total) by mouth 2 (two) times daily. 05/02/15   [name], MD  Cholecalciferol (VITAMIN D-3) 1000 UNITS CAPS Take 2,000 Units by mouth 2 (two) times daily.    Historical Provider, MD  diclofenac sodium (VOLTAREN) 1 % GEL Apply 2 g topically 3 (three) times daily as needed (pain).  03/04/15   Historical Provider, MD  diphenhydrAMINE (BENADRYL) 25 MG tablet Take 50 mg  by mouth 2 (two) times daily as needed for allergies (eye allergies).     Historical Provider, MD  enalapril (VASOTEC) 10 MG tablet Take 1 tablet (10 mg total) by mouth daily. 05/02/15   [name], MD  furosemide (LASIX) 20 MG tablet Take 1 tablet (20 mg total) by mouth daily. Patient taking differently: Take 20 mg by mouth daily as needed for fluid.  01/30/15   Mihai Croitoru, MD  HYDROcodone-acetaminophen (NORCO/VICODIN) 5-325 MG tablet Take 1-2 tablets by mouth every 4 (four) hours as needed for severe pain. Please do not take at the same time as Tramadol. 04/25/15   Sela Hua, MD  hydrocortisone (ANUSOL-HC) 25 MG suppository Place 1 suppository (25 mg total) rectally 2 (two) times daily. Patient not taking: Reported on 04/23/2015 02/19/15   Mercy Riding, MD  Hypromellose (ARTIFICIAL TEARS OP) Place 1 drop into both eyes 3 (three) times daily as needed (dry eyes).    Historical Provider, MD  Iron-Vitamins (GERITOL COMPLETE PO) Take 1  tablet by mouth daily.    Historical Provider, MD  isosorbide mononitrate (IMDUR) 30 MG 24 hr tablet Take 30 mg by mouth daily. 04/05/15   Historical Provider, MD  ketoconazole (NIZORAL) 2 % cream Apply 1 application topically daily. Patient not taking: Reported on 04/23/2015 03/14/15   [name], MD  loperamide (IMODIUM) 2 MG capsule Take 1 capsule (2 mg total) by mouth as needed for diarrhea or loose stools. 07/05/14   Danae Orleans, PA-C  metoprolol (LOPRESSOR) 100 MG tablet Take 1 tablet (100 mg total) by mouth 2 (two) times daily. 05/02/15   [name], MD  nitroGLYCERIN (NITROSTAT) 0.4 MG SL tablet Place 1 tablet (0.4 mg total) under the tongue every 5 (five) minutes as needed for chest pain. 03/21/14   Mihai Croitoru, MD  nystatin (MYCOSTATIN/NYSTOP) 100000 UNIT/GM POWD apply to area TOPICALLY 3 times daily until healing complete Patient not taking: Reported on 04/23/2015 03/18/15   Patrecia Pour, MD  Omega-3 Fatty Acids (FISH OIL) 1000 MG CAPS Take  1-2 capsules by mouth 2 (two) times daily. Takes one in the morning and two capsules at night.    Historical Provider, MD  omeprazole (PRILOSEC) 20 MG capsule Take 1 capsule (20 mg total) by mouth daily. 03/01/15   [name], MD  polyethylene glycol (MIRALAX / GLYCOLAX) packet Take 17 g by mouth 2 (two) times daily. Patient taking differently: Take 17 g by mouth daily as needed for moderate constipation.  10/11/14   Danae Orleans, PA-C  rOPINIRole (REQUIP) 0.25 MG tablet Take 1 tablet (0.25 mg total) by mouth 3 (three) times daily. 01/03/15   [name], MD  simvastatin (ZOCOR) 40 MG tablet Take 0.5 tablets (20 mg total) by mouth at bedtime. 01/03/15   [name], MD  tiZANidine (ZANAFLEX) 4 MG capsule Take 4 mg by mouth at bedtime as needed for muscle spasms.    Historical Provider, MD  traMADol (ULTRAM) 50 MG tablet TAKE 1 TABLET BY MOUTH TWICE A DAY Patient taking differently: TAKE 1 TABLET BY MOUTH TWICE A DAY PRN PAIN 01/03/15   [name], MD  traZODone (DESYREL) 50 MG tablet Take 1 tablet (50 mg total) by mouth at bedtime. 01/03/15   [name], MD  triamcinolone cream (KENALOG) 0.1 % Apply 1 application topically 2 (two) times daily as needed. Patient taking differently: Apply 1 application topically 2 (two) times daily as needed (knee).  03/27/15   [name], MD  vitamin B-12 (CYANOCOBALAMIN) 100 MCG tablet Take 2,000 mcg by mouth 2 (two) times daily.    Historical Provider, MD   There were no vitals taken for this visit. Physical Exam  Constitutional: She is oriented to person, place, and time. She appears well-developed and well-nourished.  Ill appearing  HENT:  Head: Normocephalic.  Eyes: Conjunctivae are normal.  Neck: Normal range of motion. Neck supple.  Cardiovascular: Normal rate, regular rhythm and normal heart sounds.   Pulmonary/Chest: Effort normal and breath sounds normal. No respiratory distress. She has no wheezes. She has no rales.   Abdominal: Soft. Bowel sounds are normal. She exhibits no distension. There is no tenderness. There is no rebound.  Musculoskeletal: She exhibits no edema.  Neurological: She is alert and oriented to person, place, and time. No cranial nerve deficit. Coordination normal.  Skin: Skin is warm and dry. There is pallor.  Psychiatric: She has a normal mood and affect. Her behavior is normal.  Nursing note and vitals  reviewed.   ED Course  Procedures (including critical care time) Labs Review Labs Reviewed  CBC WITH DIFFERENTIAL/PLATELET - Abnormal; Notable for the following:    RBC 2.86 (*)    Hemoglobin 9.1 (*)    HCT 26.8 (*)    All other components within normal limits  COMPREHENSIVE METABOLIC PANEL - Abnormal; Notable for the following:    Glucose, Bld 134 (*)    BUN 48 (*)    Creatinine, Ser 2.44 (*)    Calcium 8.7 (*)    Total Protein 5.9 (*)    Albumin 2.9 (*)    AST 81 (*)    ALT 146 (*)    GFR calc non Af Amer 17 (*)    GFR calc Af Amer 20 (*)    All other components within normal limits  URINALYSIS, ROUTINE W REFLEX MICROSCOPIC (NOT AT [facility]) - Abnormal; Notable for the following:    APPearance CLOUDY (*)    Leukocytes, UA SMALL (*)    All other components within normal limits  URINE MICROSCOPIC-ADD ON - Abnormal; Notable for the following:    Squamous Epithelial / LPF FEW (*)    Bacteria, UA FEW (*)    All other components within normal limits  CBG MONITORING, ED - Abnormal; Notable for the following:    Glucose-Capillary 102 (*)    All other components within normal limits  I-STAT CHEM 8, ED - Abnormal; Notable for the following:    BUN 45 (*)    Creatinine, Ser 2.50 (*)    Glucose, Bld 129 (*)    Hemoglobin 10.2 (*)    HCT 30.0 (*)    All other components within normal limits  MRSA PCR SCREENING  CULTURE, BLOOD (ROUTINE X 2)  CULTURE, BLOOD (ROUTINE X 2)  BRAIN NATRIURETIC PEPTIDE  I-STAT CG4 LACTIC ACID, ED  I-STAT TROPOININ, ED  I-STAT CG4 LACTIC ACID,  ED    Imaging Review Dg Chest Portable 1 View  05/06/2015   CLINICAL DATA:  Difficulty breathing and weakness. Atrial fibrillation  EXAM: PORTABLE CHEST 1 VIEW  COMPARISON:  April 23, 2015  FINDINGS: There is minimal scarring in the left base. The lungs elsewhere are clear. Heart size and pulmonary vascularity are normal. No adenopathy. Patient is status post coronary artery bypass grafting. There is postoperative change in the right shoulder. Degenerative changes noted in each shoulder.  IMPRESSION: Minimal scarring left base.  No edema or consolidation.   Electronically Signed   By: Lowella Grip III M.D.   On: 05/06/2015 09:50   I have personally reviewed and evaluated these images and lab results as part of my medical decision-making.   EKG Interpretation   Date/Time:  Monday May 06 2015 09:05:01 EDT Ventricular Rate:  51 PR Interval:  172 QRS Duration: 86 QT Interval:  478 QTC Calculation: 440 R Axis:   47 Text Interpretation:  Sinus rhythm Abnrm T, consider ischemia,  anterolateral lds rate is slower compared to May 01 2015 Confirmed by  Regenia Skeeter  MD, [place] [phone]) on 05/06/2015 9:12:58 AM      CRITICAL CARE Performed by: Jeannett Senior A Total critical care time: 30 Critical care time was exclusive of separately billable procedures and treating other patients. Critical care was necessary to treat or prevent imminent or life-threatening deterioration. Critical care was time spent personally by me on the following activities: development of treatment plan with patient and/or surrogate as well as nursing, discussions with consultants, evaluation of patient's response to treatment, examination  of patient, obtaining history from patient or surrogate, ordering and performing treatments and interventions, ordering and review of laboratory studies, ordering and review of radiographic studies, pulse oximetry and re-evaluation of patient's condition.   MDM   Final  diagnoses:  Hypotension, unspecified hypotension type  Bradycardia   Patient with generalized weakness, vital signs show blood pressure 70 systolic, heart rate in [id number]. Patient appears to be pale. Will get labs, septic workup, fluid bolus ordered.  Patient suddenly went into junctional rhythm, with heart rate in 40s. Blood pressure down to 50 systolic. Patient mentating well, but appears to be very lethargic. Will try atropine, fluids still running.   9:32 AM Spoke with cardiology, will come by and see  9:50 AM Cardiology at bedside. Will start on dopamine and glucagone for possible beta blocker overdose.   pt will be admitted by them  Filed Vitals:   05/06/15 1530 05/06/15 1545 05/06/15 1600 05/06/15 1615  BP: 113/69 116/79 141/113   Pulse: 67 64 66 64  Temp:      TempSrc:      Resp: 22 24 24 23   SpO2: 97% 97% 100% 94%     Jeannett Senior, PA-C 05/06/15 [place], MD 05/07/15 [phone]

## 2015-05-07 ENCOUNTER — Inpatient Hospital Stay (HOSPITAL_COMMUNITY): Payer: PPO

## 2015-05-07 ENCOUNTER — Encounter (HOSPITAL_COMMUNITY): Payer: Self-pay

## 2015-05-07 DIAGNOSIS — D72829 Elevated white blood cell count, unspecified: Secondary | ICD-10-CM | POA: Insufficient documentation

## 2015-05-07 DIAGNOSIS — I9589 Other hypotension: Secondary | ICD-10-CM

## 2015-05-07 LAB — CBC
HCT: 31 % — ABNORMAL LOW (ref 36.0–46.0)
Hemoglobin: 10.1 g/dL — ABNORMAL LOW (ref 12.0–15.0)
MCH: 30.4 pg (ref 26.0–34.0)
MCHC: 32.6 g/dL (ref 30.0–36.0)
MCV: 93.4 fL (ref 78.0–100.0)
PLATELETS: 197 10*3/uL (ref 150–400)
RBC: 3.32 MIL/uL — ABNORMAL LOW (ref 3.87–5.11)
RDW: 14.9 % (ref 11.5–15.5)
WBC: 17 10*3/uL — AB (ref 4.0–10.5)

## 2015-05-07 LAB — BASIC METABOLIC PANEL
Anion gap: 10 (ref 5–15)
BUN: 48 mg/dL — AB (ref 6–20)
CHLORIDE: 106 mmol/L (ref 101–111)
CO2: 21 mmol/L — ABNORMAL LOW (ref 22–32)
CREATININE: 2.02 mg/dL — AB (ref 0.44–1.00)
Calcium: 9 mg/dL (ref 8.9–10.3)
GFR calc Af Amer: 25 mL/min — ABNORMAL LOW (ref >60)
GFR, EST NON AFRICAN AMERICAN: 22 mL/min — AB (ref >60)
Glucose, Bld: 102 mg/dL — ABNORMAL HIGH (ref 65–99)
Potassium: 5.5 mmol/L — ABNORMAL HIGH (ref 3.5–5.1)
SODIUM: 137 mmol/L (ref 135–145)

## 2015-05-07 LAB — GLUCOSE, CAPILLARY
Glucose-Capillary: 96 mg/dL (ref 65–99)
Glucose-Capillary: 98 mg/dL (ref 65–99)

## 2015-05-07 LAB — TSH: TSH: 1.086 u[IU]/mL (ref 0.350–4.500)

## 2015-05-07 LAB — SEDIMENTATION RATE: SED RATE: 62 mm/h — AB (ref 0–22)

## 2015-05-07 MED ORDER — TRAMADOL HCL 50 MG PO TABS
50.0000 mg | ORAL_TABLET | Freq: Four times a day (QID) | ORAL | Status: DC | PRN
  Administered 2015-05-07 – 2015-05-14 (×7): 50 mg via ORAL
  Filled 2015-05-07 (×7): qty 1

## 2015-05-07 MED ORDER — ENALAPRIL MALEATE 2.5 MG PO TABS
2.5000 mg | ORAL_TABLET | Freq: Two times a day (BID) | ORAL | Status: DC
  Filled 2015-05-07 (×3): qty 1

## 2015-05-07 MED ORDER — TIZANIDINE HCL 4 MG PO TABS
4.0000 mg | ORAL_TABLET | Freq: Every evening | ORAL | Status: DC | PRN
  Administered 2015-05-07 – 2015-05-13 (×4): 4 mg via ORAL
  Filled 2015-05-07 (×7): qty 1

## 2015-05-07 NOTE — Progress Notes (Signed)
  Echocardiogram 2D Echocardiogram has been performed.  Jacqueline [name] 05/07/2015, 3:10 PM

## 2015-05-07 NOTE — Clinical Documentation Improvement (Signed)
Cardiology  Abnormal Lab/Test Results:  **Creatinine values 10/10: 2.44, 2.50, 10/11: 2.02*  Possible Clinical Conditions associated with below indicators  *Acute renal failure   *Acute on chronic renal failure (please give stage of chronic)  *Chronic renal failure only (please give stage)  *Chronic renal insufficiency only  Other Condition  Cannot Clinically Determine   Supporting Information: *Per H&P: Acute on chronic renal insufficiency *Per Consult note 10/11: "Acute on chronic renal insufficiency" and *Renal function normal earlier this year"  Treatment Provided: *Patient was started on dopamine for shock *Received a 522mL bolus, for hypotension*   Please exercise your independent, professional judgment when responding. A specific answer is not anticipated or expected.   Thank You,  [place] [phone]

## 2015-05-07 NOTE — Consult Note (Addendum)
ELECTROPHYSIOLOGY CONSULT NOTE    Patient ID: Jacqueline [name] MRN: [id number], DOB/AGE: 02-Oct-1930 81 y.o.  Admit date: 05/06/2015 Date of Consult: 05/07/2015  Primary Physician: Zigmund Gottron, MD Primary Cardiologist: [place]  Reason for Consultation: symptomatic bradycardia  HPI:  Jacqueline [name] is a 81 y.o. female with a past medical history significant for CAD, hypertension, hyperlipidemia, and persistent atrial fibrillation.  She was previously on Eliquis but this was discontinued 2/2 GI bleed.  She is not willing to go back on anticoagulation. For the last 2 weeks, she has had significant fatigue and exercise intolerance. Yesterday morning was worse than the other days and she called EMS and was brought to [facility] for further evaluation.  She was found to be markedly hypotensive with SBP in the 70's and heart rates in the 50's.  She was placed on Dopamine with improvement in blood pressure and is feeling better this morning. Telemetry has demonstrated sinus rhythm with rates 50-60's.  She has been seen recently by her PCP and was treated for UTI. She also has had her BB uptitrated for AF with RVR.     Lab work is notable for normal TSH, WBC 17, K 5.5, creat 2.02.    She denies recent chest pain, shortness of breath.  She is largely unaware of her AF but does occasionally have palpitations.  She has not had recent fevers or chills. No dark, tarry stools, no nausea or vomiting.  She has persistent arthritic pain that is her main concern today.   Last echo 08/2014 demonstrated EF 55-60%, mild LVH, grade 2 diastolic dysfunction, mild MR, LA 34  Past Medical History  Diagnosis Date  . Hypertension   . CAD (coronary artery disease)     a. s/p CABG x 2 (LIMA->LAD, VG->Diag);  b. 08/2014 Cath: LM nl, LAD 90p, LCX nl, RCA nl/dominant, LIMA->LAD atretic, VG->D2 patent w/ retrograde filling of LAD, EF 55-60%-->Med Rx.  Marland Kitchen Hyperlipemia   . Insomnia   . Diverticulitis 02/21/2012  . GERD  (gastroesophageal reflux disease)   . Atrial fibrillation with rapid ventricular response ([place]) 02/2014    a. CHA2DS2VASc = 6 -> on eliquis;  b. 02/2014 s/p DCCV;  c. 08/2014 Echo: EF 55-60%, Gr 2 DD, mild MR, triv AI.  Marland Kitchen Arthritis     back     Surgical History:  Past Surgical History  Procedure Laterality Date  . Coronary artery bypass graft  2006    off-pump bypass surgery with LIMA to the LAD and SVG to the second diagonal artery ( performed by Dr Servando Snare)   . Tee without cardioversion N/A 02/28/2014    Procedure: TRANSESOPHAGEAL ECHOCARDIOGRAM (TEE);  Surgeon: Sanda Klein, MD;  Location: [facility];  Service: Cardiovascular;  Laterality: N/A;  . Cardioversion N/A 02/28/2014    Procedure: CARDIOVERSION;  Surgeon: Sanda Klein, MD;  Location: [facility];  Service: Cardiovascular;  Laterality: N/A;  . Cataract extraction Bilateral 2014    with lens implanted  . Abdominal hysterectomy  1975  . Rotator cuff repair Bilateral     r-1999, l- 2005  . Joint replacement Bilateral     L-2004, R-2006  . Total knee arthroplasty Right 07/02/2014    Procedure: Resection of Infected Right Total Knee Arthroplasty with placement antibiotic spacer;  Surgeon: Mauri Pole, MD;  Location: [facility];  Service: Orthopedics;  Laterality: Right;  . Left heart catheterization with coronary/graft angiogram N/A 09/17/2014    Procedure: LEFT HEART CATHETERIZATION WITH Beatrix Fetters;  Surgeon: Marcello Moores  Floyce Stakes, MD;  Location: [facility] CATH LAB;  Service: Cardiovascular;  Laterality: N/A;  . Reimplantation of total knee Right 10/08/2014    Procedure: REIMPLANTATION OF RIGHT TOTAL KNEE ARTHROPLASTY WITH REMOVAL OF ANTIBIOTIC SPACER;  Surgeon: Paralee Cancel, MD;  Location: [facility];  Service: Orthopedics;  Laterality: Right;  . Colonoscopy N/A 02/19/2015    Procedure: COLONOSCOPY;  Surgeon: Ladene Artist, MD;  Location: [facility] ENDOSCOPY;  Service: Endoscopy;  Laterality: N/A;     Prescriptions prior to admission   Medication Sig Dispense Refill Last Dose  . acetaminophen (TYLENOL) 500 MG tablet Take 500-1,000 mg by mouth every 6 (six) hours as needed for moderate pain or headache.   Past Month at Unknown time  . alendronate (FOSAMAX) 70 MG tablet Take 1 tablet (70 mg total) by mouth once a week. Take with a full glass of water on an empty stomach, remain upright (Patient taking differently: Take 70 mg by mouth once a week. Take with a full glass of water on an empty stomach, remain upright  Saturday) 12 tablet 3 Past Week at Unknown time  . ALPRAZolam (XANAX) 0.5 MG tablet Take 1 tablet (0.5 mg total) by mouth 2 (two) times daily as needed for anxiety. 60 tablet 5 05/05/2015 at Unknown time  . amoxicillin (AMOXIL) 500 MG capsule Take 1 capsule (500 mg total) by mouth 3 (three) times daily. 21 capsule 0 05/05/2015 at Unknown time  . aspirin EC 81 MG tablet Take 81 mg by mouth daily.   05/05/2015 at Unknown time  . celecoxib (CELEBREX) 100 MG capsule Take 1 capsule (100 mg total) by mouth 2 (two) times daily. 60 capsule 3 05/05/2015 at Unknown time  . Cholecalciferol (VITAMIN D-3) 1000 UNITS CAPS Take 2,000 Units by mouth 2 (two) times daily.   05/05/2015 at Unknown time  . diclofenac sodium (VOLTAREN) 1 % GEL Apply 2 g topically 3 (three) times daily as needed (pain).   0 Past Month at Unknown time  . diphenhydrAMINE (BENADRYL) 25 MG tablet Take 50 mg by mouth 2 (two) times daily as needed for allergies (eye allergies).    Past Month at Unknown time  . enalapril (VASOTEC) 10 MG tablet Take 1 tablet (10 mg total) by mouth daily. (Patient taking differently: Take 10 mg by mouth 2 (two) times daily. )   05/06/2015 at Unknown time  . furosemide (LASIX) 20 MG tablet Take 1 tablet (20 mg total) by mouth daily. (Patient taking differently: Take 20 mg by mouth daily as needed for fluid. ) 90 tablet 3 Past Week at Unknown time  . HYDROcodone-acetaminophen (NORCO/VICODIN) 5-325 MG tablet Take 1-2 tablets by mouth every 4  (four) hours as needed for severe pain. Please do not take at the same time as Tramadol. 15 tablet 0 Past Month at Unknown time  . Iron-Vitamins (GERITOL COMPLETE PO) Take 1 tablet by mouth daily.   05/06/2015 at Unknown time  . metoprolol (LOPRESSOR) 100 MG tablet Take 1 tablet (100 mg total) by mouth 2 (two) times daily. (Patient taking differently: Take 50 mg by mouth 2 (two) times daily. ) 180 tablet 3 05/06/2015 at 0800  . nitroGLYCERIN (NITROSTAT) 0.4 MG SL tablet Place 1 tablet (0.4 mg total) under the tongue every 5 (five) minutes as needed for chest pain. 25 tablet 2 unk at unk  . Omega-3 Fatty Acids (FISH OIL) 1000 MG CAPS Take 1-2 capsules by mouth 2 (two) times daily. Takes one in the morning and two capsules at night.  05/05/2015 at Unknown time  . omeprazole (PRILOSEC) 20 MG capsule Take 1 capsule (20 mg total) by mouth daily. 90 capsule 3 05/05/2015 at Unknown time  . polyethylene glycol (MIRALAX / GLYCOLAX) packet Take 17 g by mouth 2 (two) times daily. (Patient taking differently: Take 17 g by mouth daily as needed for moderate constipation. ) 14 each 0 Past Month at Unknown time  . Probiotic Product (PROBIOTIC PO) Take 1 capsule by mouth daily.   05/05/2015 at Unknown time  . rOPINIRole (REQUIP) 0.25 MG tablet Take 1 tablet (0.25 mg total) by mouth 3 (three) times daily. 270 tablet 3 05/05/2015 at Unknown time  . simvastatin (ZOCOR) 40 MG tablet Take 0.5 tablets (20 mg total) by mouth at bedtime. 45 tablet 3 05/05/2015 at Unknown time  . tiZANidine (ZANAFLEX) 4 MG capsule Take 4 mg by mouth at bedtime as needed for muscle spasms.   Past Week at Unknown time  . traMADol (ULTRAM) 50 MG tablet TAKE 1 TABLET BY MOUTH TWICE A DAY (Patient taking differently: TAKE 1 TABLET BY MOUTH TWICE A DAY PRN PAIN) 60 tablet 5 Past Week at Unknown time  . traZODone (DESYREL) 50 MG tablet Take 1 tablet (50 mg total) by mouth at bedtime. 90 tablet 3 05/05/2015 at Unknown time  . vitamin B-12 (CYANOCOBALAMIN)  100 MCG tablet Take 2,000 mcg by mouth 2 (two) times daily.   05/05/2015 at Unknown time    Inpatient Medications:  . amoxicillin  500 mg Oral BID  . antiseptic oral rinse  7 mL Mouth Rinse BID  . aspirin EC  81 mg Oral Daily  . heparin subcutaneous  5,000 Units Subcutaneous 3 times per day  . pantoprazole  40 mg Oral Daily  . simvastatin  20 mg Oral QHS   . DOPamine 8 mcg/kg/min (05/07/15 0800)     Allergies:  Allergies  Allergen Reactions  . Atorvastatin Other (See Comments)    Myalgias in legs  . Crestor [Rosuvastatin] Other (See Comments)    Myalgias in legs  . Morphine And Related Other (See Comments)    "Crazy thoughts, severe headache, sick"  . Prednisone     On two occasions has caused A fib    Social History   Social History  . Marital Status: Widowed    Spouse Name: N/A  . Number of Children: N/A  . Years of Education: N/A   Occupational History  . Not on file.   Social History Main Topics  . Smoking status: Never Smoker   . Smokeless tobacco: Never Used  . Alcohol Use: No  . Drug Use: No  . Sexual Activity: No   Other Topics Concern  . Not on file   Social History Narrative     Family History  Problem Relation Age of Onset  . Parkinson's disease Mother      Review of Systems: All other systems reviewed and are otherwise negative except as noted above.  Physical Exam: Filed Vitals:   05/07/15 0600 05/07/15 0700 05/07/15 0744 05/07/15 0800  BP: 116/71 102/88  130/74  Pulse: 52 63  58  Temp:   97.6 F (36.4 C)   TempSrc:   Oral   Resp: 18 18  19   SpO2: 96% 100%  93%    GEN- The patient is elderly appearing, alert and oriented x 3 today.   HEENT: normocephalic, atraumatic; sclera clear, conjunctiva pink; hearing intact; oropharynx clear; neck supple  Lungs- Clear to ausculation bilaterally, normal work of breathing.  No wheezes, rales, rhonchi Heart- Bradycardic regular rate and rhythm  GI- soft, non-tender, non-distended, bowel sounds  present  Extremities- no clubbing, cyanosis, or edema  MS- no significant deformity or atrophy Skin- warm and dry, no rash or lesion Psych- euthymic mood, full affect Neuro- strength and sensation are intact  Labs:   Lab Results  Component Value Date   WBC 17.0* 05/07/2015   HGB 10.1* 05/07/2015   HCT 31.0* 05/07/2015   MCV 93.4 05/07/2015   PLT 197 05/07/2015    Recent Labs Lab 05/06/15 0913  05/07/15 0244  NA 137  < > 137  K 4.7  < > 5.5*  CL 105  < > 106  CO2 22  --  21*  BUN 48*  < > 48*  CREATININE 2.44*  < > 2.02*  CALCIUM 8.7*  --  9.0  PROT 5.9*  --   --   BILITOT 0.7  --   --   ALKPHOS 78  --   --   ALT 146*  --   --   AST 81*  --   --   GLUCOSE 134*  < > 102*  < > = values in this interval not displayed.    Radiology/Studies:  Dg Chest Portable 1 View 05/06/2015   CLINICAL DATA:  Difficulty breathing and weakness. Atrial fibrillation  EXAM: PORTABLE CHEST 1 VIEW  COMPARISON:  April 23, 2015  FINDINGS: There is minimal scarring in the left base. The lungs elsewhere are clear. Heart size and pulmonary vascularity are normal. No adenopathy. Patient is status post coronary artery bypass grafting. There is postoperative change in the right shoulder. Degenerative changes noted in each shoulder.  IMPRESSION: Minimal scarring left base.  No edema or consolidation.   Electronically Signed   By: Lowella Grip III M.D.   On: 05/06/2015 09:50    [id number] bradycardia, rate 51  TELEMETRY: sinus bradycardia, rates 50's  Assessment/Plan: 1. Sinus bradycardia I do not think that her bradycardia was causing her symptoms.  Heart rates were consistently 50-60's with persistent hypotension.   2.  Hypotension Unclear cause. Her Metoprolol has been held and she has been placed on Dopamine with improvement in SBP.  WBC is elevated this morning and she has had a recent UTI.  Will ask IM to assist with workup. Blood cultures negative last month. Repeat pending this  admission Will update echo  3.  Persistent atrial fibrillation The patient has persistent atrial fibrillation with RVR and has been maintained on Toprol May need to consider AAD therapy at some point, but would like for her to recover from acute issues first. With CAD, she is not a candidate for 1C agents. With renal insufficiency, she is not ideal candidate for Tikosyn or Sotalol. Amiodarone is likely best option. CHADS2VASC is at least 5, but with history of GI bleed, she declines anticoagulation  4.  Acute on chronic renal insufficiency Renal function normal earlier this year Improved this morning from yesterday   Signed, Chanetta Marshall, NP 05/07/2015 8:25 AM  EP Attending  Patient seen and examined. I concur with the findings as noted above by Chanetta Marshall, NP-C with minimal amendment. Exam reveals an elderly but well appearing woman with a RRR and unlabored breathing and normal breath sounds. The patient has been admitted with hypotension, and noted to have relative bradycardia with HR's in the low 50's. Her blood pressures in the [id number], systolic. She had a UTI. She has no infectious symptoms. She has had  a h/o GI bleeding on systemic anti-coagulation which was stopped and she was actually in atrial fib with an RVR a week ago. While her relative bradycardia may have long term implications in the setting of atrial fib (tachy-brady syndrome) and she may ultimately require PPM, her HR in the 50's cannot be responsible for her hypotension and requirement for dopamine for BP support. She has had blood cultures obtained. Will look for additional causes of hypotension. Will try and wean her dopamine and give IV fluids which will also help with renal function. Will continue to hold her AV nodal/sinus nodal blocking drugs though they may need to be restarted at a lower dose.  Mikle Bosworth.D.

## 2015-05-07 NOTE — Consult Note (Signed)
[facility] Pager: [phone]  Patient name: Jacqueline [name] Medical record number: [id number] Date of birth: [birth year] Age: 81 y.o. Gender: female  Primary Care Provider: Zigmund Gottron, MD Primary: Cardiology Code Status: Full Code  Chief Complaint: Hypotension with leukocytosis  Assessment and Plan: Jacqueline [name] is a 80 y.o. female presenting with hypotension and leukocytosis . PMH is significant for   Leukocytosis: WBC of 17 this AM. Patient states she recently had a course of prednisone for polyarthralgia 4 days ago, although from office notes, this may have been the case after her recent hospital discharge. No physical exam findings/history consistent with possible etiology of infection. Patient has been afebrile and asymptomatic. Does not meet qSOFA criteria currently. Lactate less than 2. Patient currently on amoxicillin for suspected UTI. Blood cultures pending. Possibly related to a URI with patient's sneezing, rhinorrhea and mild cough. - Urine culture - follow-up blood cultures - monitor vitals - monitor mentation for worsening symptoms - discontinue amoxicillin if cultures negative  Hypotension: discontinued off of dopamine ggt this morning at 1000. Patient has been maintaining good BP with adequate MAP. No overt infectious cause of hypotension from exam/labs.  Anemia: appears to be at baseline. Asymptomatic. - monitor with CBCs  Tachy/brady: pulses in 50s to 60s. Patient asymptomatic. Discussion of pacer vs medical management. - Per primary team/EP  FEN/GI: Heart healthy per primary Prophylaxis: subq heparin   History of Present Illness:  Jacqueline [name] is a 81 y.o. female initially presenting with severe weakness found to have hypotension requiring vasopressors and bradycardia. Patient states she has had generalized weakness for weeks that has been followed up and is currently being managed by her PCP. She reports  recently receiving a prednisone taper, which she did not tolerate very well. She was seen last week by her PCP and metoprolol increased to 100mg  BID. Over the weekend, she started feeling bad and weak, which prompted her to come to the ED. She was found to be hypotensive and bradycardic. Afebrile with normal white count at the time. No symptoms to suggest infection  Review Of Systems: Per HPI with the following additions: None Otherwise 12 point review of systems was performed and was unremarkable.  Patient Active Problem List   Diagnosis Date Noted  . Hypotension 05/06/2015  . Symptomatic bradycardia 05/06/2015  . Tachy-brady syndrome ([place]) 05/06/2015  . Shock circulatory ([place])- due to over medication 05/06/2015  . Acute on chronic renal insufficiency ([place]) 05/06/2015  . CAD- s/p CABG 05/06/2015  . Bradycardia   . Arterial hypotension   . Urinary tract infection with hematuria 05/01/2015  . Neck pain   . Polyarthralgia 04/23/2015  . Encounter for preventive health examination 03/27/2015  . Acute on chronic diastolic heart failure ([place])   . Fatigue 11/15/2014  . Diarrhea 11/14/2014  . Anemia due to chronic blood loss 10/10/2014  . S/P knee replacement 10/08/2014  . PAF (paroxysmal atrial fibrillation) ([place]) 09/24/2014  . S/P right TKA removal and spacer placement 07/02/2014  . Osteoporosis 03/22/2014  . GERD (gastroesophageal reflux disease)   . Chronic kidney disease, stage 2, mildly decreased GFR 12/22/2013  . Abnormal thyroid blood test 12/22/2013  . CHF NYHA class II (symptoms with moderately strenuous activities) ([place]) 07/22/2011  . HYPERCHOLESTEROLEMIA 09/23/2006  . HYPERTENSION, BENIGN SYSTEMIC 09/23/2006  . Coronary atherosclerosis 09/23/2006  . RHINITIS, ALLERGIC 09/23/2006  . REFLUX ESOPHAGITIS 09/23/2006  . DIVERTICULOSIS OF COLON 09/23/2006  . OSTEOARTHRITIS, MULTI SITES 09/23/2006  . BACK  PAIN, LOW 09/23/2006  . INSOMNIA NOS 09/23/2006   Past Medical  History: Past Medical History  Diagnosis Date  . Hypertension   . CAD (coronary artery disease)     a. s/p CABG x 2 (LIMA->LAD, VG->Diag);  b. 08/2014 Cath: LM nl, LAD 90p, LCX nl, RCA nl/dominant, LIMA->LAD atretic, VG->D2 patent w/ retrograde filling of LAD, EF 55-60%-->Med Rx.  Marland Kitchen Hyperlipemia   . Insomnia   . Diverticulitis 02/21/2012  . GERD (gastroesophageal reflux disease)   . Atrial fibrillation with rapid ventricular response ([place]) 02/2014    a. CHA2DS2VASc = 6 -> on eliquis;  b. 02/2014 s/p DCCV;  c. 08/2014 Echo: EF 55-60%, Gr 2 DD, mild MR, triv AI.  Marland Kitchen Arthritis     back   Past Surgical History: Past Surgical History  Procedure Laterality Date  . Coronary artery bypass graft  2006    off-pump bypass surgery with LIMA to the LAD and SVG to the second diagonal artery ( performed by Dr Servando Snare)   . Tee without cardioversion N/A 02/28/2014    Procedure: TRANSESOPHAGEAL ECHOCARDIOGRAM (TEE);  Surgeon: Sanda Klein, MD;  Location: [facility];  Service: Cardiovascular;  Laterality: N/A;  . Cardioversion N/A 02/28/2014    Procedure: CARDIOVERSION;  Surgeon: Sanda Klein, MD;  Location: [facility];  Service: Cardiovascular;  Laterality: N/A;  . Cataract extraction Bilateral 2014    with lens implanted  . Abdominal hysterectomy  1975  . Rotator cuff repair Bilateral     r-1999, l- 2005  . Joint replacement Bilateral     L-2004, R-2006  . Total knee arthroplasty Right 07/02/2014    Procedure: Resection of Infected Right Total Knee Arthroplasty with placement antibiotic spacer;  Surgeon: Mauri Pole, MD;  Location: [facility];  Service: Orthopedics;  Laterality: Right;  . Left heart catheterization with coronary/graft angiogram N/A 09/17/2014    Procedure: LEFT HEART CATHETERIZATION WITH Beatrix Fetters;  Surgeon: Troy Sine, MD;  Location: [facility] CATH LAB;  Service: Cardiovascular;  Laterality: N/A;  . Reimplantation of total knee Right 10/08/2014    Procedure:  REIMPLANTATION OF RIGHT TOTAL KNEE ARTHROPLASTY WITH REMOVAL OF ANTIBIOTIC SPACER;  Surgeon: Paralee Cancel, MD;  Location: [facility];  Service: Orthopedics;  Laterality: Right;  . Colonoscopy N/A 02/19/2015    Procedure: COLONOSCOPY;  Surgeon: Ladene Artist, MD;  [facility] END[facility]COPY;  Service: Endoscopy;  Laterality: N/A;   Social History: Social History  Substance Use Topics  . Smoking status: Never Smoker   . Smokeless tobacco: Never Used  . Alcohol Use: No   Additional social history: None  Please also refer to relevant sections of EMR.  Family History: Family History  Problem Relation Age of Onset  . Parkinson's disease Mother    Allergies and Medications: Allergies  Allergen Reactions  . Atorvastatin Other (See Comments)    Myalgias in legs  . Crestor [Rosuvastatin] Other (See Comments)    Myalgias in legs  . Morphine And Related Other (See Comments)    "Crazy thoughts, severe headache, sick"  . Prednisone     On two occasions has caused A fib   No current facility-administered medications on file prior to encounter.   Current Outpatient Prescriptions on File Prior to Encounter  Medication Sig Dispense Refill  . acetaminophen (TYLENOL) 500 MG tablet Take 500-1,000 mg by mouth every 6 (six) hours as needed for moderate pain or headache.    . alendronate (FOSAMAX) 70 MG tablet Take 1 tablet (70 mg total) by mouth  once a week. Take with a full glass of water on an empty stomach, remain upright (Patient taking differently: Take 70 mg by mouth once a week. Take with a full glass of water on an empty stomach, remain upright  Saturday) 12 tablet 3  . ALPRAZolam (XANAX) 0.5 MG tablet Take 1 tablet (0.5 mg total) by mouth 2 (two) times daily as needed for anxiety. 60 tablet 5  . amoxicillin (AMOXIL) 500 MG capsule Take 1 capsule (500 mg total) by mouth 3 (three) times daily. 21 capsule 0  . aspirin EC 81 MG tablet Take 81 mg by mouth daily.    . celecoxib (CELEBREX) 100 MG  capsule Take 1 capsule (100 mg total) by mouth 2 (two) times daily. 60 capsule 3  . Cholecalciferol (VITAMIN D-3) 1000 UNITS CAPS Take 2,000 Units by mouth 2 (two) times daily.    . diclofenac sodium (VOLTAREN) 1 % GEL Apply 2 g topically 3 (three) times daily as needed (pain).   0  . diphenhydrAMINE (BENADRYL) 25 MG tablet Take 50 mg by mouth 2 (two) times daily as needed for allergies (eye allergies).     . enalapril (VASOTEC) 10 MG tablet Take 1 tablet (10 mg total) by mouth daily. (Patient taking differently: Take 10 mg by mouth 2 (two) times daily. )    . furosemide (LASIX) 20 MG tablet Take 1 tablet (20 mg total) by mouth daily. (Patient taking differently: Take 20 mg by mouth daily as needed for fluid. ) 90 tablet 3  . HYDROcodone-acetaminophen (NORCO/VICODIN) 5-325 MG tablet Take 1-2 tablets by mouth every 4 (four) hours as needed for severe pain. Please do not take at the same time as Tramadol. 15 tablet 0  . Iron-Vitamins (GERITOL COMPLETE PO) Take 1 tablet by mouth daily.    . metoprolol (LOPRESSOR) 100 MG tablet Take 1 tablet (100 mg total) by mouth 2 (two) times daily. (Patient taking differently: Take 50 mg by mouth 2 (two) times daily. ) 180 tablet 3  . nitroGLYCERIN (NITROSTAT) 0.4 MG SL tablet Place 1 tablet (0.4 mg total) under the tongue every 5 (five) minutes as needed for chest pain. 25 tablet 2  . Omega-3 Fatty Acids (FISH OIL) 1000 MG CAPS Take 1-2 capsules by mouth 2 (two) times daily. Takes one in the morning and two capsules at night.    Marland Kitchen omeprazole (PRILOSEC) 20 MG capsule Take 1 capsule (20 mg total) by mouth daily. 90 capsule 3  . polyethylene glycol (MIRALAX / GLYCOLAX) packet Take 17 g by mouth 2 (two) times daily. (Patient taking differently: Take 17 g by mouth daily as needed for moderate constipation. ) 14 each 0  . rOPINIRole (REQUIP) 0.25 MG tablet Take 1 tablet (0.25 mg total) by mouth 3 (three) times daily. 270 tablet 3  . simvastatin (ZOCOR) 40 MG tablet Take 0.5  tablets (20 mg total) by mouth at bedtime. 45 tablet 3  . tiZANidine (ZANAFLEX) 4 MG capsule Take 4 mg by mouth at bedtime as needed for muscle spasms.    . traMADol (ULTRAM) 50 MG tablet TAKE 1 TABLET BY MOUTH TWICE A DAY (Patient taking differently: TAKE 1 TABLET BY MOUTH TWICE A DAY PRN PAIN) 60 tablet 5  . traZODone (DESYREL) 50 MG tablet Take 1 tablet (50 mg total) by mouth at bedtime. 90 tablet 3  . vitamin B-12 (CYANOCOBALAMIN) 100 MCG tablet Take 2,000 mcg by mouth 2 (two) times daily.      Objective: BP 135/70 mmHg  Pulse 52  Temp(Src) 97.6 F (36.4 C) (Oral)  Resp 22  Ht 5\' 1"  (1.549 m)  Wt 145 lb 8.1 oz (66 kg)  BMI 27.51 kg/m2  SpO2 98% Exam: General: Fair appearing, lying in bed, no distress Eyes: PERRLA ENTM: Oropharynx clear and moist Neck: Supple Cardiovascular: bradycardia, regular rhythm, no murmur Respiratory: Clear to auscultation bilaterally Abdomen: Soft, non-tender, non-distended MSK: Moves extremities spontaneously, no edema Skin: No obvious rashes Neuro: Alert, oriented Psych: Slightly flat affect  Labs and Imaging: CBC BMET   Recent Labs Lab 05/07/15 0244  WBC 17.0*  HGB 10.1*  HCT 31.0*  PLT 197    Recent Labs Lab 05/07/15 0244  NA 137  K 5.5*  CL 106  CO2 21*  BUN 48*  CREATININE 2.02*  GLUCOSE 102*  CALCIUM 9.0     Dg Chest Portable 1 View  05/06/2015   CLINICAL DATA:  Difficulty breathing and weakness. Atrial fibrillation  EXAM: PORTABLE CHEST 1 VIEW  COMPARISON:  April 23, 2015  FINDINGS: There is minimal scarring in the left base. The lungs elsewhere are clear. Heart size and pulmonary vascularity are normal. No adenopathy. Patient is status post coronary artery bypass grafting. There is postoperative change in the right shoulder. Degenerative changes noted in each shoulder.  IMPRESSION: Minimal scarring left base.  No edema or consolidation.   Electronically Signed   By: Lowella Grip III M.D.   On: 05/06/2015 09:50     Mariel Aloe, MD 05/07/2015, 10:21 AM PGY-3, [place] Intern pager: [phone], text pages welcome

## 2015-05-08 ENCOUNTER — Inpatient Hospital Stay (HOSPITAL_COMMUNITY): Payer: PPO

## 2015-05-08 DIAGNOSIS — M25511 Pain in right shoulder: Secondary | ICD-10-CM | POA: Insufficient documentation

## 2015-05-08 DIAGNOSIS — I272 Other secondary pulmonary hypertension: Secondary | ICD-10-CM

## 2015-05-08 DIAGNOSIS — D72829 Elevated white blood cell count, unspecified: Secondary | ICD-10-CM

## 2015-05-08 DIAGNOSIS — M25512 Pain in left shoulder: Secondary | ICD-10-CM

## 2015-05-08 LAB — URINE CULTURE: Culture: NO GROWTH

## 2015-05-08 LAB — RETICULOCYTES
RBC.: 2.89 MIL/uL — AB (ref 3.87–5.11)
RETIC COUNT ABSOLUTE: 57.8 10*3/uL (ref 19.0–186.0)
RETIC CT PCT: 2 % (ref 0.4–3.1)

## 2015-05-08 LAB — BASIC METABOLIC PANEL
ANION GAP: 7 (ref 5–15)
BUN: 45 mg/dL — ABNORMAL HIGH (ref 6–20)
CHLORIDE: 103 mmol/L (ref 101–111)
CO2: 24 mmol/L (ref 22–32)
Calcium: 8.4 mg/dL — ABNORMAL LOW (ref 8.9–10.3)
Creatinine, Ser: 2.38 mg/dL — ABNORMAL HIGH (ref 0.44–1.00)
GFR calc Af Amer: 21 mL/min — ABNORMAL LOW (ref >60)
GFR, EST NON AFRICAN AMERICAN: 18 mL/min — AB (ref >60)
GLUCOSE: 111 mg/dL — AB (ref 65–99)
Potassium: 4.7 mmol/L (ref 3.5–5.1)
Sodium: 134 mmol/L — ABNORMAL LOW (ref 135–145)

## 2015-05-08 LAB — CBC
HCT: 26.3 % — ABNORMAL LOW (ref 36.0–46.0)
HEMOGLOBIN: 8.6 g/dL — AB (ref 12.0–15.0)
MCH: 30.4 pg (ref 26.0–34.0)
MCHC: 32.7 g/dL (ref 30.0–36.0)
MCV: 92.9 fL (ref 78.0–100.0)
PLATELETS: 148 10*3/uL — AB (ref 150–400)
RBC: 2.83 MIL/uL — AB (ref 3.87–5.11)
RDW: 15.4 % (ref 11.5–15.5)
WBC: 6.7 10*3/uL (ref 4.0–10.5)

## 2015-05-08 LAB — MAGNESIUM: MAGNESIUM: 2.1 mg/dL (ref 1.7–2.4)

## 2015-05-08 LAB — VITAMIN B12: VITAMIN B 12: 3967 pg/mL — AB (ref 180–914)

## 2015-05-08 LAB — IRON AND TIBC
IRON: 26 ug/dL — AB (ref 28–170)
Saturation Ratios: 12 % (ref 10.4–31.8)
TIBC: 225 ug/dL — AB (ref 250–450)
UIBC: 199 ug/dL

## 2015-05-08 LAB — FOLATE: Folate: 31 ng/mL (ref >5.9)

## 2015-05-08 LAB — ANTINUCLEAR ANTIBODIES, IFA: ANA Ab, IFA: NEGATIVE

## 2015-05-08 LAB — PHOSPHORUS: Phosphorus: 4.7 mg/dL — ABNORMAL HIGH (ref 2.5–4.6)

## 2015-05-08 LAB — SAVE SMEAR

## 2015-05-08 LAB — LACTATE DEHYDROGENASE: LDH: 162 U/L (ref 98–192)

## 2015-05-08 LAB — FERRITIN: FERRITIN: 461 ng/mL — AB (ref 11–307)

## 2015-05-08 MED ORDER — SODIUM CHLORIDE 0.9 % IV SOLN
INTRAVENOUS | Status: DC
  Administered 2015-05-08 – 2015-05-14 (×4): via INTRAVENOUS

## 2015-05-08 MED ORDER — ATROPINE SULFATE 0.1 MG/ML IJ SOLN
INTRAMUSCULAR | Status: AC
  Filled 2015-05-08: qty 10

## 2015-05-08 MED ORDER — TECHNETIUM TC 99M DIETHYLENETRIAME-PENTAACETIC ACID: 40.0000 | Freq: Once | INTRAVENOUS | Status: DC | PRN

## 2015-05-08 MED ORDER — TECHNETIUM TO 99M ALBUMIN AGGREGATED
6.0000 | Freq: Once | INTRAVENOUS | Status: AC | PRN
  Administered 2015-05-08: 6 via INTRAVENOUS

## 2015-05-08 NOTE — Progress Notes (Addendum)
SUBJECTIVE: The patient feels that she is improved today. No chest pain or shortness of breath. She wonders if a PPM would help her symptoms. Dopamine discontinued yesterday, BP stable.   Echo 05-07-15 demonstrated EF 17-51%, grade 2 diastolic dysfunction, moderate MR, PA pressure 85, LA 35.  CURRENT MEDICATIONS: . amoxicillin  500 mg Oral BID  . antiseptic oral rinse  7 mL Mouth Rinse BID  . aspirin EC  81 mg Oral Daily  . atropine      . heparin subcutaneous  5,000 Units Subcutaneous 3 times per day  . pantoprazole  40 mg Oral Daily  . simvastatin  20 mg Oral QHS   . sodium chloride      OBJECTIVE: Physical Exam: Filed Vitals:   05/08/15 0345 05/08/15 0400 05/08/15 0500 05/08/15 0630  BP:  123/63 110/64   Pulse:  51 52   Temp: 97.4 F (36.3 C)   97.3 F (36.3 C)  TempSrc: Oral   Oral  Resp:  16 16   Height:      Weight:    146 lb 6.2 oz (66.4 kg)  SpO2:  100% 99%     Intake/Output Summary (Last 24 hours) at 05/08/15 0734 Last data filed at 05/07/15 1500  Gross per 24 hour  Intake     94 ml  Output      0 ml  Net     94 ml    Telemetry reveals sinus rhythm with short run of AF with RVR  GEN- The patient is elderly appearing, alert and oriented x 3 today, sleeping but arouses.   Head- normocephalic, atraumatic Eyes-  Sclera clear, conjunctiva pink Ears- hearing intact Oropharynx- clear Neck- supple  Lungs- Clear to ausculation bilaterally, normal work of breathing Heart- Regular rate and rhythm  GI- soft, NT, ND, + BS Extremities- no clubbing, cyanosis, or edema Skin- no rash or lesion Psych- euthymic mood, full affect Neuro- strength and sensation are intact  LABS: Basic Metabolic Panel:  Recent Labs  05/07/15 0244 05/08/15 0406  NA 137 134*  K 5.5* 4.7  CL 106 103  CO2 21* 24  GLUCOSE 102* 111*  BUN 48* 45*  CREATININE 2.02* 2.38*  CALCIUM 9.0 8.4*  MG  --  2.1  PHOS  --  4.7*   Liver Function Tests:  Recent Labs  05/06/15 0913    AST 81*  ALT 146*  ALKPHOS 78  BILITOT 0.7  PROT 5.9*  ALBUMIN 2.9*   CBC:  Recent Labs  05/06/15 0913 05/06/15 0923 05/07/15 0244  WBC 8.6  --  17.0*  NEUTROABS 6.4  --   --   HGB 9.1* 10.2* 10.1*  HCT 26.8* 30.0* 31.0*  MCV 93.7  --  93.4  PLT 182  --  197   Thyroid Function Tests:  Recent Labs  05/07/15 0244  TSH 1.086    RADIOLOGY: Dg Chest Portable 1 View 05/06/2015  CLINICAL DATA:  Difficulty breathing and weakness. Atrial fibrillation EXAM: PORTABLE CHEST 1 VIEW COMPARISON:  April 23, 2015 FINDINGS: There is minimal scarring in the left base. The lungs elsewhere are clear. Heart size and pulmonary vascularity are normal. No adenopathy. Patient is status post coronary artery bypass grafting. There is postoperative change in the right shoulder. Degenerative changes noted in each shoulder. IMPRESSION: Minimal scarring left base.  No edema or consolidation. Electronically Signed   By: Lowella Grip III M.D.   On: 05/06/2015 09:50    ASSESSMENT AND PLAN:  Principal Problem:   Tachy-brady syndrome (HCC) Active Problems:   HYPERTENSION, BENIGN SYSTEMIC   CHF NYHA class II (symptoms with moderately strenuous activities) ([facility])   Chronic kidney disease, stage 2, mildly decreased GFR   PAF (paroxysmal atrial fibrillation) ([facility])   Hypotension   Symptomatic bradycardia   Shock circulatory ([place])- due to over medication   Acute on chronic renal insufficiency ([facility])   CAD- s/p CABG   Bradycardia   Arterial hypotension   Leukocytosis    1.  Sinus bradycardia Unclear how symptomatic this is for patient.  She has had sinus rates in the 50's dating back to 2014.  She may ultimately require a pacemaker for tachy/brady syndrome, but I do not think that sinus bradycardia is what caused acute weakness.  2.  Hypotension Unclear cause Resolved  Continue to hold antihypertensives Will gently hydrate today with worsening renal function  3.  Persistent atrial  fibrillation Toprol discontinued 2/2 hypotension She does not have good AAD therapy options.  Amiodarone is likely only option, but would exacerbate bradycardia Will follow for now Off [place] 2/2 previous GI bleed  4.  Acute on chronic renal insufficiency Creat slightly worse today Will hydrate gently as above  5.  Leukocytosis Will update CBC this morning Blood cultures no growth x1 day No fevers  6.  Polyarthralgias/elevated ESR Rheumatology consulted by FPTS  7.  Pulmonary hypertension PA pressure 85 by echo yesterday PA pressure normal by echo 02/2014 Likely needs RHC to further evaluate - will discuss with Dr Caryl Comes Will check VQ scan to rule out PE (known AF off [place]) - no CT with elevated creatinine She does not appear volume overloaded on exam  8. CAD No recent ischemic symptoms  Ok to transfer to telemetry Dr Caryl Comes to see today  Chanetta Marshall, NP 05/08/2015 7:34 AM  The patient has had modest shortness of breath and exercise tolerance. She has not had any pleuritic pain. She has not had significant edema. Objective findings of following her hospitalization included renal insufficiency which is a new and suggestive of prerenal azotemia, pulmonary hypertension with the only prior measurement being 40 and now 85; she has modest elevation of her sedimentation rate (62) and anemia. She has a history of difficulty with a knee surgery requiring 2 surgeries earlier this year; she dates her shortness of breath back to that time. She did not have significant unilateral swelling.  She's also had problems with exercise intolerance and its impression that when her heart is beating fast and irregularly it is her symptom. When it is not beating fast, her exercise tolerance is quite good suggesting atrial fibrillation which has been documented to be a significant contributor to her episodic symptoms.  Notwithstanding her renal insufficiency, given the complexities of her other medical  conditions, I would prefer to use dofetilide. QT intervals were normal earlier this summer while being a somewhat exaggerated on the ECG dated 10/12.  We await medicine input regarding sedimentation rate, renal function, hematologic issues, infection, and I would certainly suggest that the hypotension with which she presented was not related to her bradycardia but to some other systemic illness.  I think the patient might be better served by having family practice service the primary service given the multiple ongoing active issues.  For today, will undertake VQ scanning. History of atrial fibrillation as a primary symptom driver, I think the pretest likelihood as a cause of the hypotension is possibly intermediate positive as the cause of hypotension, SO in  the absence of right ventricular dysfunction is VQ scanning is not intermediate or high risk I would not pursue further exclusion  We will recommend right heart catheterization. Echocardiogram with an elevated E/E prime suggest high filling pressures. Mitral regurgitation is more evident. It is possible that the pulmonary hypertension is also secondary to left ventricular pressure overload.  As relates to atrial fibrillation, as noted, she will need antiarrhythmic therapy for control of her symptoms as rate control will necessarily invoke the need for backup AV pacing;  if the QT remains prolonged, she would not be a candidate for dofetilide; she would only be a candidate for amiodarone. This likely would also end up requiring pacing.  We'll reevaluate in the morning

## 2015-05-08 NOTE — Progress Notes (Signed)
Pt arrived via wheelchair from unit 2MW, pt placed on telemetry and called to central monitoring. Pt oriented to room, call bell and staff numbers to call for assist/needs. Pt without complaints.

## 2015-05-08 NOTE — Progress Notes (Signed)
Family Medicine Teaching Service Daily Progress Note Intern Pager: [phone]  Patient name: Jacqueline [name] Medical record number: [id number] Date of birth: November 02, 1930 Age: 81 y.o. Gender: female  Primary Care Provider: Zigmund Gottron, MD Primary Team: Cardiology Code Status: Full code   Assessment and Plan: 81 year old female with past medical history of atrial fibrillation, tachybradycardia syndrome, CAD status post CABG, anemia due to chronic blood loss, CKD stage II, and hypertension admitted to the cardiology service for tachybradycardia syndrome. Family medicine has been consulted for further evaluation of leukocytosis.  1. Tachybradycardia syndrome - Pulse in 50's and 60's. Patient does feel light headed at times when sitting up too quickly.  -primary management per cardiology  2. Leukocytosis - resolved. WBC was 17 yesterday now 6.7. Patient may have possible UTI given recent enterococcus infection, patient has been afebrile. Urine and blood cx NG x 24 hrs. There is clinical concern for possible rheumatologic disorder as well with Rheumatoid factor of 24.5 and ESR at 62. No joint pain or tenderness on exam today but has had previous polyarthralgia's.  - Continue empiric Amoxicillin (10/11-  ) - Consult rheumatology  - F/u Urine and blood cx - ANA - Monitor vitals  3. Anemia - Currently 8.6 from 10.1; no recent iron studies.  - Fe studies - Ferritin - B12 and folate studies - haptoglobin  - LDH - blood smear  4. AKI on CKD stage III - Cr almost unchanged from yesterday - AM BMP - Continue IV fluids  5. Neck/shoulder tenderness - resolved with Toradol, Tylenol, and Zanaflex.  - Continue with Toradol, Tylenol, and Zanaflex PRN  FEN/GI: NS @ 77m/hr PPx: Heparin  Disposition: Home pending cardiology work up  Subjective:  Patient states she did not sleep very well last night but she feels better than she felt yesterday. Her neck and shoulder tenderness have  completely resolved with Toradol, Tylenol, and Zanaflex. She denies any joint tenderness of pain. Patient admits to only feeling light headed when she sits up quickly in bed.   Objective: Temp:  [97.3 F (36.3 C)-98.5 F (36.9 C)] 97.4 F (36.3 C) (10/12 0747) Pulse Rate:  [38-79] 38 (10/12 0800) Resp:  [13-26] 20 (10/12 0800) BP: (101-172)/(55-124) 108/79 mmHg (10/12 0800) SpO2:  [87 %-100 %] 100 % (10/12 0800) Weight:  [146 lb 6.2 oz (66.4 kg)] 146 lb 6.2 oz (66.4 kg) (10/12 0630) Physical Exam: Gen.: Pleasant female, in no acute distress. Does appear pale Cardiac: Bradycardic, regular rhythm, S1 and S2 present, no murmurs, no heaves or thrills Respiratory: Clear to patient bilaterally, normal effort Abdomen: Soft, nontender, bowel sounds present MSK: No paraspinal and trapezius muscle tenderness, cervical range of motion is full  Neuro: No focal defecits  Laboratory:  Recent Labs Lab 05/06/15 0913 05/06/15 0923 05/07/15 0244 05/08/15 0830  WBC 8.6  --  17.0* 6.7  HGB 9.1* 10.2* 10.1* 8.6*  HCT 26.8* 30.0* 31.0* 26.3*  PLT 182  --  197 148*    Recent Labs Lab 05/06/15 0913 05/06/15 0923 05/07/15 0244 05/08/15 0406  NA 137 137 137 134*  K 4.7 4.6 5.5* 4.7  CL 105 105 106 103  CO2 22  --  21* 24  BUN 48* 45* 48* 45*  CREATININE 2.44* 2.50* 2.02* 2.38*  CALCIUM 8.7*  --  9.0 8.4*  PROT 5.9*  --   --   --   BILITOT 0.7  --   --   --   ALKPHOS 78  --   --   --  ALT 146*  --   --   --   AST 81*  --   --   --   GLUCOSE 134* 129* 102* 111*   Erythrocyte Sedimentation Rate     Component Value Date/Time   ESRSEDRATE 62* 05/07/2015 1950   ESRSEDRATE 42 08/06/2014     Imaging/Diagnostic Tests:No results found.   Carlyle Dolly, MD 05/08/2015, 8:59 AM PGY-1, [place] Intern pager: [phone], text pages welcome

## 2015-05-08 NOTE — Progress Notes (Signed)
Pt has become bradycardic as the night goes on. Tried paging [facility] multiple times. Called [facility] and spoke with Baxter Flattery in regards to this. MD Radene Gunning returned page afterwards. Ordered an EKG and waiting for labs to result. Patient seems to be also more fatigued and weak/light headed. MD Radene Gunning came to evaluate rhythm from EKG and monitor. Placed Atropine at bedside.

## 2015-05-09 ENCOUNTER — Encounter (HOSPITAL_COMMUNITY)
Admission: EM | Disposition: A | Discharge status: Home / Self Care | Payer: Self-pay | Source: Home / Self Care | Attending: Cardiovascular Disease

## 2015-05-09 ENCOUNTER — Inpatient Hospital Stay (HOSPITAL_COMMUNITY): Payer: PPO

## 2015-05-09 DIAGNOSIS — I270 Primary pulmonary hypertension: Secondary | ICD-10-CM

## 2015-05-09 DIAGNOSIS — I272 Pulmonary hypertension, unspecified: Secondary | ICD-10-CM | POA: Insufficient documentation

## 2015-05-09 DIAGNOSIS — D638 Anemia in other chronic diseases classified elsewhere: Secondary | ICD-10-CM | POA: Insufficient documentation

## 2015-05-09 HISTORY — PX: CARDIAC CATHETERIZATION: SHX172

## 2015-05-09 LAB — BASIC METABOLIC PANEL
Anion gap: 12 (ref 5–15)
BUN: 41 mg/dL — AB (ref 6–20)
CO2: 22 mmol/L (ref 22–32)
CREATININE: 1.78 mg/dL — AB (ref 0.44–1.00)
Calcium: 9.4 mg/dL (ref 8.9–10.3)
Chloride: 106 mmol/L (ref 101–111)
GFR calc Af Amer: 29 mL/min — ABNORMAL LOW (ref >60)
GFR calc non Af Amer: 25 mL/min — ABNORMAL LOW (ref >60)
GLUCOSE: 89 mg/dL (ref 65–99)
Potassium: 5.5 mmol/L — ABNORMAL HIGH (ref 3.5–5.1)
SODIUM: 140 mmol/L (ref 135–145)

## 2015-05-09 LAB — HAPTOGLOBIN: Haptoglobin: 230 mg/dL — ABNORMAL HIGH (ref 34–200)

## 2015-05-09 LAB — CBC
HCT: 27.7 % — ABNORMAL LOW (ref 36.0–46.0)
Hemoglobin: 9.2 g/dL — ABNORMAL LOW (ref 12.0–15.0)
MCH: 31.5 pg (ref 26.0–34.0)
MCHC: 33.2 g/dL (ref 30.0–36.0)
MCV: 94.9 fL (ref 78.0–100.0)
PLATELETS: 159 10*3/uL (ref 150–400)
RBC: 2.92 MIL/uL — ABNORMAL LOW (ref 3.87–5.11)
RDW: 15.8 % — AB (ref 11.5–15.5)
WBC: 9.5 10*3/uL (ref 4.0–10.5)

## 2015-05-09 LAB — CYCLIC CITRUL PEPTIDE ANTIBODY, IGG/IGA: CCP ANTIBODIES IGG/IGA: 223 U — AB (ref 0–19)

## 2015-05-09 LAB — PROTIME-INR
INR: 1.11 (ref 0.00–1.49)
PROTHROMBIN TIME: 14.5 s (ref 11.6–15.2)

## 2015-05-09 SURGERY — RIGHT HEART CATH

## 2015-05-09 MED ORDER — MIDAZOLAM HCL 2 MG/2ML IJ SOLN
INTRAMUSCULAR | Status: DC | PRN
  Administered 2015-05-09: 1 mg via INTRAVENOUS

## 2015-05-09 MED ORDER — SODIUM CHLORIDE 0.9 % IV SOLN: 250.0000 mL | INTRAVENOUS | Status: DC | PRN

## 2015-05-09 MED ORDER — LIDOCAINE HCL (PF) 1 % IJ SOLN
INTRAMUSCULAR | Status: AC
  Filled 2015-05-09: qty 30

## 2015-05-09 MED ORDER — ONDANSETRON HCL 4 MG/2ML IJ SOLN: 4.0000 mg | Freq: Four times a day (QID) | INTRAMUSCULAR | Status: DC | PRN

## 2015-05-09 MED ORDER — SODIUM CHLORIDE 0.9 % IJ SOLN: 3.0000 mL | INTRAMUSCULAR | Status: DC | PRN

## 2015-05-09 MED ORDER — SODIUM CHLORIDE 0.9 % IJ SOLN
3.0000 mL | Freq: Two times a day (BID) | INTRAMUSCULAR | Status: DC
Start: 2015-05-09 — End: 2015-05-14
  Administered 2015-05-09 – 2015-05-12 (×6): 3 mL via INTRAVENOUS

## 2015-05-09 MED ORDER — FENTANYL CITRATE (PF) 100 MCG/2ML IJ SOLN
INTRAMUSCULAR | Status: AC
  Filled 2015-05-09: qty 4

## 2015-05-09 MED ORDER — HEPARIN (PORCINE) IN NACL 2-0.9 UNIT/ML-% IJ SOLN
INTRAMUSCULAR | Status: AC
  Filled 2015-05-09: qty 500

## 2015-05-09 MED ORDER — ACETAMINOPHEN 325 MG PO TABS: 650.0000 mg | ORAL_TABLET | ORAL | Status: DC | PRN

## 2015-05-09 MED ORDER — LIDOCAINE HCL (PF) 1 % IJ SOLN
INTRAMUSCULAR | Status: DC | PRN
  Administered 2015-05-09: 2 mL via SUBCUTANEOUS

## 2015-05-09 MED ORDER — HEPARIN (PORCINE) IN NACL 2-0.9 UNIT/ML-% IJ SOLN
INTRAMUSCULAR | Status: DC | PRN
  Administered 2015-05-09: 13:00:00

## 2015-05-09 MED ORDER — POLYETHYLENE GLYCOL 3350 17 G PO PACK
17.0000 g | PACK | Freq: Every day | ORAL | Status: DC
  Administered 2015-05-12 – 2015-05-13 (×2): 17 g via ORAL
  Filled 2015-05-09 (×5): qty 1

## 2015-05-09 MED ORDER — SODIUM CHLORIDE 0.9 % IJ SOLN
3.0000 mL | Freq: Two times a day (BID) | INTRAMUSCULAR | Status: DC
  Administered 2015-05-09 – 2015-05-12 (×2): 3 mL via INTRAVENOUS

## 2015-05-09 MED ORDER — MIDAZOLAM HCL 2 MG/2ML IJ SOLN
INTRAMUSCULAR | Status: AC
  Filled 2015-05-09: qty 4

## 2015-05-09 MED ORDER — FENTANYL CITRATE (PF) 100 MCG/2ML IJ SOLN
INTRAMUSCULAR | Status: DC | PRN
  Administered 2015-05-09: 25 ug via INTRAVENOUS

## 2015-05-09 MED ORDER — SODIUM CHLORIDE 0.9 % IJ SOLN
3.0000 mL | Freq: Two times a day (BID) | INTRAMUSCULAR | Status: DC
  Administered 2015-05-11 – 2015-05-12 (×2): 3 mL via INTRAVENOUS

## 2015-05-09 MED ORDER — SODIUM CHLORIDE 0.9 % IV SOLN: INTRAVENOUS | Status: DC

## 2015-05-09 MED ORDER — FUROSEMIDE 10 MG/ML IJ SOLN
60.0000 mg | Freq: Two times a day (BID) | INTRAMUSCULAR | Status: DC
  Administered 2015-05-09 – 2015-05-10 (×2): 60 mg via INTRAVENOUS
  Filled 2015-05-09 (×2): qty 6

## 2015-05-09 SURGICAL SUPPLY — 10 items
CATH BALLN WEDGE 5F 110CM (CATHETERS) ×3 IMPLANT
KIT HEART RIGHT NAMIC (KITS) ×3 IMPLANT
PACK CARDIAC CATHETERIZATION (CUSTOM PROCEDURE TRAY) ×3 IMPLANT
PROTECTION STATION PRESSURIZED (MISCELLANEOUS) ×3
SHEATH FAST CATH BRACH 5F 5CM (SHEATH) ×3 IMPLANT
STATION PROTECTION PRESSURIZED (MISCELLANEOUS) IMPLANT
TRANSDUCER W/STOPCOCK (MISCELLANEOUS) ×4 IMPLANT
TUBING ART PRESS 72  MALE/FEM (TUBING) ×2
TUBING ART PRESS 72 MALE/FEM (TUBING) IMPLANT
WIRE EMERALD 3MM-J .025X260CM (WIRE) ×2 IMPLANT

## 2015-05-09 NOTE — H&P (View-Only) (Signed)
SUBJECTIVE: The patient feels that she is improved today. No chest pain or shortness of breath. She is complaining of constipation this morning.  Labs pending.   Echo 05-07-15 demonstrated EF 71-24%, grade 2 diastolic dysfunction, moderate MR, PA pressure 85, LA 35.  CURRENT MEDICATIONS: . amoxicillin  500 mg Oral BID  . antiseptic oral rinse  7 mL Mouth Rinse BID  . aspirin EC  81 mg Oral Daily  . heparin subcutaneous  5,000 Units Subcutaneous 3 times per day  . pantoprazole  40 mg Oral Daily  . simvastatin  20 mg Oral QHS  . sodium chloride  3 mL Intravenous Q12H   . sodium chloride 50 mL/hr at 05/08/15 0838  . sodium chloride 10 mL/hr at 05/09/15 0545    OBJECTIVE: Physical Exam: Filed Vitals:   05/08/15 1902 05/08/15 2201 05/09/15 0447 05/09/15 0456  BP:  128/59 145/68   Pulse:  55 59   Temp:  97.5 F (36.4 C) 98.2 F (36.8 C)   TempSrc:  Oral Oral   Resp:  20 18   Height:      Weight:    145 lb (65.772 kg)  SpO2: 95% 96% 93%     Intake/Output Summary (Last 24 hours) at 05/09/15 0804 Last data filed at 05/09/15 0457  Gross per 24 hour  Intake 638.33 ml  Output    200 ml  Net 438.33 ml    Telemetry reveals sinus rhythm with short run of AF with RVR  GEN- The patient is elderly appearing, alert and oriented x 3 today, sleeping but arouses.   Head- normocephalic, atraumatic Eyes-  Sclera clear, conjunctiva pink Ears- hearing intact Oropharynx- clear Neck- supple  Lungs- Clear to ausculation bilaterally, normal work of breathing Heart- Regular rate and rhythm  GI- soft, NT, ND, + BS Extremities- no clubbing, cyanosis, or edema Skin- no rash or lesion Psych- euthymic mood, full affect Neuro- strength and sensation are intact  LABS: Basic Metabolic Panel:  Recent Labs  05/08/15 0406 05/09/15 0657  NA 134* 140  K 4.7 5.5*  CL 103 106  CO2 24 22  GLUCOSE 111* 89  BUN 45* 41*  CREATININE 2.38* 1.78*  CALCIUM 8.4* 9.4  MG 2.1  --   PHOS 4.7*   --    Liver Function Tests:  Recent Labs  05/06/15 0913  AST 81*  ALT 146*  ALKPHOS 78  BILITOT 0.7  PROT 5.9*  ALBUMIN 2.9*   CBC:  Recent Labs  05/06/15 0913  05/08/15 0830 05/09/15 0657  WBC 8.6  < > 6.7 9.5  NEUTROABS 6.4  --   --   --   HGB 9.1*  < > 8.6* 9.2*  HCT 26.8*  < > 26.3* 27.7*  MCV 93.7  < > 92.9 94.9  PLT 182  < > 148* 159  < > = values in this interval not displayed. Thyroid Function Tests:  Recent Labs  05/07/15 0244  TSH 1.086    RADIOLOGY: Dg Chest Portable 1 View 05/06/2015  CLINICAL DATA:  Difficulty breathing and weakness. Atrial fibrillation EXAM: PORTABLE CHEST 1 VIEW COMPARISON:  April 23, 2015 FINDINGS: There is minimal scarring in the left base. The lungs elsewhere are clear. Heart size and pulmonary vascularity are normal. No adenopathy. Patient is status post coronary artery bypass grafting. There is postoperative change in the right shoulder. Degenerative changes noted in each shoulder. IMPRESSION: Minimal scarring left base.  No edema or consolidation. Electronically Signed  By: Lowella Grip III M.D.   On: 05/06/2015 09:50    ASSESSMENT AND PLAN:  Principal Problem:   Tachy-brady syndrome (HCC) Active Problems:   HYPERTENSION, BENIGN SYSTEMIC   CHF NYHA class II (symptoms with moderately strenuous activities) (HCC)   Chronic kidney disease, stage 2, mildly decreased GFR   PAF (paroxysmal atrial fibrillation) (HCC)   Hypotension   Symptomatic bradycardia   Shock circulatory (HCC)- due to over medication   Acute on chronic renal insufficiency (HCC)   CAD- s/p CABG   Bradycardia   Arterial hypotension   Leukocytosis   Pain of both shoulder joints    1.  Sinus bradycardia Sinus rates mid 50s to 60s this admission Stable off BB  2.  Hypotension Resolved   3.  Persistent atrial fibrillation Toprol discontinued 2/2 hypotension She does not have good AAD therapy options.  Amiodarone is likely only option, but would  exacerbate bradycardia Will follow for now.  May ultimately need PPM for tachy/brady.  Off [place] 2/2 previous GI bleed  4.  Acute on chronic renal insufficiency BMET pending  5.  Leukocytosis Resolved as of yesterday CBC pending this morning Blood cultures no growth x2 days No fevers  6.  Polyarthralgias/elevated ESR Rheumatology consulted by FPTS  7.  Pulmonary hypertension PA pressure 85 by echo yesterday PA pressure normal by echo 02/2014 VQ scan negative yesterday Will plan RHC today to further evaluate    8. CAD No recent ischemic symptoms  9. Anemia CBC pending this morning Per IM  Chanetta Marshall, NP 05/09/2015 8:04 AM   Renal function improved considerably with mild hydration yesterday foir RHC today to assess pulm HTN Will check ECG and then decide re amio vs dofetilide preferring latter 2/2 formers likely aggravation of her bradycardia An intermediate solution might be ISA BB

## 2015-05-09 NOTE — Progress Notes (Signed)
Patient is now prep for cardiac cath this AM. Consent signed. Information given to patient/family.   Ave Filter, RN

## 2015-05-09 NOTE — Progress Notes (Signed)
Patient arrived back to room from ENDO. She is alert at this time. Denied pain, no other distress noted. CHF packet given and reviewed. Will continue to monitor.   Ave Filter, RN

## 2015-05-09 NOTE — Progress Notes (Signed)
Family Medicine Teaching Service Daily Progress Note Intern Pager: [phone]  Patient name: Jacqueline [name] Medical record number: [id number] Date of birth: 11-Jun-1931 Age: 81 y.o. Gender: female  Primary Care Provider: Zigmund Gottron, MD Primary Team: Cardiology Code Status: Full code   Assessment and Plan: 81 year old female with past medical history of atrial fibrillation, tachybradycardia syndrome, CAD status post CABG, anemia due to chronic blood loss, CKD stage II, and hypertension admitted to the cardiology service for tachybradycardia syndrome. Family medicine has been consulted for further evaluation of leukocytosis.  1. Tachybradycardia syndrome - Pulse in 50's and 60's. Patient does feel light headed at times when sitting up too quickly.  -primary management per cardiology  2. Pulmonary HTN- PA pressure of 85 per echo on 05/08/15. Negative V/Q scan yesterday - Right heart catheterization today per cardiology team  2. Leukocytosis - resolved. WBC was 17>>> 6.7>>>9.5. Patient may have possible UTI given recent enterococcus infection, patient has been afebrile. Urine and blood cx NG x 48 hrs. There is clinical concern for possible rheumatologic disorder as well with Rheumatoid factor of 24.5 and ESR at 62. ANA negative. No joint pain or tenderness on exam today but has had previous polyarthralgia's.  - Continue empiric Amoxicillin (10/11-  ) - Consult rheumatology for outpatient follow up, no on-call rheumatology - Monitor vitals - f/u CCP results  3. Anemia - Currently 10.1>>8.6>>9.2; Fe studies reveal anemia of chronic disease. B12 is very high and folate is normal. Haptoglobin is high at 230. Blood smear available for view.  - FOBT pending - Pathology consult for smear result  4. AKI on CKD stage III - Cr downtrending - AM BMP - Continue IV fluids  5. Neck/shoulder tenderness - still present. resolves with Toradol, Tylenol, and Zanaflex.  - Continue with Toradol,  Tylenol, and Zanaflex PRN  FEN/GI: NS @ 5m/hr, NPO PPx: Heparin  Disposition: Home pending cardiology work up  Subjective:  Patient states she is excited because she finally had a bowel movement this morning and she feels much better. Her neck and shoulder tenderness are still present but pain does improve with Toradol, Tylenol, and Zanaflex. She denies any joint tenderness or pain.    Objective: Temp:  [97 F (36.1 C)-98.2 F (36.8 C)] 98.2 F (36.8 C) (10/13 0447) Pulse Rate:  [38-108] 59 (10/13 0447) Resp:  [13-23] 18 (10/13 0447) BP: (101-145)/(49-115) 145/68 mmHg (10/13 0447) SpO2:  [90 %-100 %] 93 % (10/13 0447) Weight:  [145 lb (65.772 kg)] 145 lb (65.772 kg) (10/13 0456) Physical Exam: Gen.: Pleasant female, in no acute distress Cardiac: Bradycardic, regular rhythm, S1 and S2 present, no murmurs, no heaves or thrills Respiratory: Clear to patient bilaterally, normal effort Abdomen: Soft, nontender, bowel sounds present MSK: Mild paraspinal and trapezius muscle tenderness, cervical range of motion is full  Neuro: No focal defecits  Laboratory:  Recent Labs Lab 05/07/15 0244 05/08/15 0830 05/09/15 0657  WBC 17.0* 6.7 9.5  HGB 10.1* 8.6* 9.2*  HCT 31.0* 26.3* 27.7*  PLT 197 148* 159    Recent Labs Lab 05/06/15 0913  05/07/15 0244 05/08/15 0406 05/09/15 0657  NA 137  < > 137 134* 140  K 4.7  < > 5.5* 4.7 5.5*  CL 105  < > 106 103 106  CO2 22  --  21* 24 22  BUN 48*  < > 48* 45* 41*  CREATININE 2.44*  < > 2.02* 2.38* 1.78*  CALCIUM 8.7*  --  9.0 8.4* 9.4  PROT 5.9*  --   --   --   --  BILITOT 0.7  --   --   --   --   ALKPHOS 78  --   --   --   --   ALT 146*  --   --   --   --   AST 81*  --   --   --   --   GLUCOSE 134*  < > 102* 111* 89  < > = values in this interval not displayed. Erythrocyte Sedimentation Rate     Component Value Date/Time   ESRSEDRATE 62* 05/07/2015 1950   ESRSEDRATE 42 08/06/2014   Results for Crisanti, NAVIKA HOOPES (MRN [id number])  as of 05/09/2015 07:13  Ref. Range 05/08/2015 13:30  LDH Latest Ref Range: 98-192 U/L 162  Iron Latest Ref Range: 28-170 ug/dL 26 (L)  UIBC Latest Units: ug/dL 199  TIBC Latest Ref Range: 250-450 ug/dL 225 (L)  Saturation Ratios Latest Ref Range: 10.4-31.8 % 12  Ferritin Latest Ref Range: 11-307 ng/mL 461 (H)  Folate Latest Ref Range: >5.9 ng/mL 31.0  Vitamin B-12 Latest Ref Range: 180-914 pg/mL 3967 (H)    Ref. Range 05/08/2015 13:30  Haptoglobin Latest Ref Range: 34-200 mg/dL 230 (H)  ANA negative  Imaging/Diagnostic Tests:Nm Pulmonary Perf And Vent  05/08/2015  CLINICAL DATA:  Shortness of breath.  Weakness. EXAM: NUCLEAR MEDICINE VENTILATION - PERFUSION LUNG SCAN TECHNIQUE: Ventilation images were obtained in multiple projections using inhaled aerosol Tc-41mDTPA. Perfusion images were obtained in multiple projections after intravenous injection of Tc-958mAA. RADIOPHARMACEUTICALS:  6.0 mCi of Technetium-9966mPA aerosol inhalation and 42 mCi of Technetium-30m65m IV COMPARISON:  Chest x-ray dated 05/06/2015 and chest CT dated 06/07/2005 FINDINGS: Ventilation: No focal ventilation defect. Perfusion: No wedge shaped peripheral perfusion defects to suggest acute pulmonary embolism. IMPRESSION: Normal ventilation-perfusion lung scan. No evidence of pulmonary embolism. Electronically Signed   By: JameLorriane Shire.   On: 05/08/2015 12:22     ChriCarlyle Dolly 05/09/2015, 7:11 AM PGY-1, [facility] pager: [phone] pages welcome

## 2015-05-09 NOTE — Care Management Important Message (Signed)
Important Message  Patient Details  Name: Jacqueline [name] MRN: [id number] Date of Birth: [birth year]   Medicare Important Message Given:  Yes-second notification given    Nathen May 05/09/2015, 10:44 AM

## 2015-05-09 NOTE — Progress Notes (Signed)
  SUBJECTIVE: The patient feels that she is improved today. No chest pain or shortness of breath. She is complaining of constipation this morning.  Labs pending.   Echo 05-07-15 demonstrated EF 60-65%, grade 2 diastolic dysfunction, moderate MR, PA pressure 85, LA 35.  CURRENT MEDICATIONS: . amoxicillin  500 mg Oral BID  . antiseptic oral rinse  7 mL Mouth Rinse BID  . aspirin EC  81 mg Oral Daily  . heparin subcutaneous  5,000 Units Subcutaneous 3 times per day  . pantoprazole  40 mg Oral Daily  . simvastatin  20 mg Oral QHS  . sodium chloride  3 mL Intravenous Q12H   . sodium chloride 50 mL/hr at 05/08/15 0838  . sodium chloride 10 mL/hr at 05/09/15 0545    OBJECTIVE: Physical Exam: Filed Vitals:   05/08/15 1902 05/08/15 2201 05/09/15 0447 05/09/15 0456  BP:  128/59 145/68   Pulse:  55 59   Temp:  97.5 F (36.4 C) 98.2 F (36.8 C)   TempSrc:  Oral Oral   Resp:  20 18   Height:      Weight:    145 lb (65.772 kg)  SpO2: 95% 96% 93%     Intake/Output Summary (Last 24 hours) at 05/09/15 0804 Last data filed at 05/09/15 0457  Gross per 24 hour  Intake 638.33 ml  Output    200 ml  Net 438.33 ml    Telemetry reveals sinus rhythm with short run of AF with RVR  GEN- The patient is elderly appearing, alert and oriented x 3 today, sleeping but arouses.   Head- normocephalic, atraumatic Eyes-  Sclera clear, conjunctiva pink Ears- hearing intact Oropharynx- clear Neck- supple  Lungs- Clear to ausculation bilaterally, normal work of breathing Heart- Regular rate and rhythm  GI- soft, NT, ND, + BS Extremities- no clubbing, cyanosis, or edema Skin- no rash or lesion Psych- euthymic mood, full affect Neuro- strength and sensation are intact  LABS: Basic Metabolic Panel:  Recent Labs  05/08/15 0406 05/09/15 0657  NA 134* 140  K 4.7 5.5*  CL 103 106  CO2 24 22  GLUCOSE 111* 89  BUN 45* 41*  CREATININE 2.38* 1.78*  CALCIUM 8.4* 9.4  MG 2.1  --   PHOS 4.7*   --    Liver Function Tests:  Recent Labs  05/06/15 0913  AST 81*  ALT 146*  ALKPHOS 78  BILITOT 0.7  PROT 5.9*  ALBUMIN 2.9*   CBC:  Recent Labs  05/06/15 0913  05/08/15 0830 05/09/15 0657  WBC 8.6  < > 6.7 9.5  NEUTROABS 6.4  --   --   --   HGB 9.1*  < > 8.6* 9.2*  HCT 26.8*  < > 26.3* 27.7*  MCV 93.7  < > 92.9 94.9  PLT 182  < > 148* 159  < > = values in this interval not displayed. Thyroid Function Tests:  Recent Labs  05/07/15 0244  TSH 1.086    RADIOLOGY: Dg Chest Portable 1 View 05/06/2015  CLINICAL DATA:  Difficulty breathing and weakness. Atrial fibrillation EXAM: PORTABLE CHEST 1 VIEW COMPARISON:  April 23, 2015 FINDINGS: There is minimal scarring in the left base. The lungs elsewhere are clear. Heart size and pulmonary vascularity are normal. No adenopathy. Patient is status post coronary artery bypass grafting. There is postoperative change in the right shoulder. Degenerative changes noted in each shoulder. IMPRESSION: Minimal scarring left base.  No edema or consolidation. Electronically Signed     By: William  Woodruff III M.D.   On: 05/06/2015 09:50    ASSESSMENT AND PLAN:  Principal Problem:   Tachy-brady syndrome (HCC) Active Problems:   HYPERTENSION, BENIGN SYSTEMIC   CHF NYHA class II (symptoms with moderately strenuous activities) (HCC)   Chronic kidney disease, stage 2, mildly decreased GFR   PAF (paroxysmal atrial fibrillation) (HCC)   Hypotension   Symptomatic bradycardia   Shock circulatory (HCC)- due to over medication   Acute on chronic renal insufficiency (HCC)   CAD- s/p CABG   Bradycardia   Arterial hypotension   Leukocytosis   Pain of both shoulder joints    1.  Sinus bradycardia Sinus rates mid 50s to 60s this admission Stable off BB  2.  Hypotension Resolved   3.  Persistent atrial fibrillation Toprol discontinued 2/2 hypotension She does not have good AAD therapy options.  Amiodarone is likely only option, but would  exacerbate bradycardia Will follow for now.  May ultimately need PPM for tachy/brady.  Off [place] 2/2 previous GI bleed  4.  Acute on chronic renal insufficiency BMET pending  5.  Leukocytosis Resolved as of yesterday CBC pending this morning Blood cultures no growth x2 days No fevers  6.  Polyarthralgias/elevated ESR Rheumatology consulted by FPTS  7.  Pulmonary hypertension PA pressure 85 by echo yesterday PA pressure normal by echo 02/2014 VQ scan negative yesterday Will plan RHC today to further evaluate    8. CAD No recent ischemic symptoms  9. Anemia CBC pending this morning Per IM  Amber Seiler, NP 05/09/2015 8:04 AM   Renal function improved considerably with mild hydration yesterday foir RHC today to assess pulm HTN Will check ECG and then decide re amio vs dofetilide preferring latter 2/2 formers likely aggravation of her bradycardia An intermediate solution might be ISA BB  

## 2015-05-09 NOTE — Interval H&P Note (Signed)
History and Physical Interval Note:  05/09/2015 12:46 PM  Jacqueline [name]  has presented today for surgery, with the diagnosis of hf  The various methods of treatment have been discussed with the patient and family. After consideration of risks, benefits and other options for treatment, the patient has consented to  Procedure(s): Right Heart Cath (N/A) as a surgical intervention .  The patient's history has been reviewed, patient examined, no change in status, stable for surgery.  I have reviewed the patient's chart and labs.  Questions were answered to the patient's satisfaction.     Vonzella Althaus [employer]

## 2015-05-10 ENCOUNTER — Encounter (HOSPITAL_COMMUNITY): Payer: Self-pay | Admitting: Cardiology

## 2015-05-10 DIAGNOSIS — I5033 Acute on chronic diastolic (congestive) heart failure: Secondary | ICD-10-CM

## 2015-05-10 DIAGNOSIS — I10 Essential (primary) hypertension: Secondary | ICD-10-CM

## 2015-05-10 LAB — POCT I-STAT 3, VENOUS BLOOD GAS (G3P V)
ACID-BASE DEFICIT: 6 mmol/L — AB (ref 0.0–2.0)
Acid-base deficit: 7 mmol/L — ABNORMAL HIGH (ref 0.0–2.0)
Bicarbonate: 19.4 mEq/L — ABNORMAL LOW (ref 20.0–24.0)
Bicarbonate: 19.9 mEq/L — ABNORMAL LOW (ref 20.0–24.0)
O2 SAT: 65 %
O2 Saturation: 58 %
PCO2 VEN: 39.2 mmHg — AB (ref 45.0–50.0)
PH VEN: 7.289 (ref 7.250–7.300)
PO2 VEN: 34 mmHg (ref 30.0–45.0)
TCO2: 21 mmol/L (ref 0–100)
TCO2: 21 mmol/L (ref 0–100)
pCO2, Ven: 41.6 mmHg — ABNORMAL LOW (ref 45.0–50.0)
pH, Ven: 7.302 — ABNORMAL HIGH (ref 7.250–7.300)
pO2, Ven: 37 mmHg (ref 30.0–45.0)

## 2015-05-10 LAB — BASIC METABOLIC PANEL
ANION GAP: 11 (ref 5–15)
BUN: 34 mg/dL — ABNORMAL HIGH (ref 6–20)
CALCIUM: 9.1 mg/dL (ref 8.9–10.3)
CO2: 27 mmol/L (ref 22–32)
Chloride: 102 mmol/L (ref 101–111)
Creatinine, Ser: 1.91 mg/dL — ABNORMAL HIGH (ref 0.44–1.00)
GFR, EST AFRICAN AMERICAN: 27 mL/min — AB (ref >60)
GFR, EST NON AFRICAN AMERICAN: 23 mL/min — AB (ref >60)
GLUCOSE: 89 mg/dL (ref 65–99)
Potassium: 4.2 mmol/L (ref 3.5–5.1)
Sodium: 140 mmol/L (ref 135–145)

## 2015-05-10 LAB — CBC
HEMATOCRIT: 29.6 % — AB (ref 36.0–46.0)
HEMOGLOBIN: 9.9 g/dL — AB (ref 12.0–15.0)
MCH: 31.3 pg (ref 26.0–34.0)
MCHC: 33.4 g/dL (ref 30.0–36.0)
MCV: 93.7 fL (ref 78.0–100.0)
PLATELETS: 166 10*3/uL (ref 150–400)
RBC: 3.16 MIL/uL — AB (ref 3.87–5.11)
RDW: 15.2 % (ref 11.5–15.5)
WBC: 6.9 10*3/uL (ref 4.0–10.5)

## 2015-05-10 MED ORDER — FUROSEMIDE 10 MG/ML IJ SOLN
40.0000 mg | Freq: Two times a day (BID) | INTRAMUSCULAR | Status: AC
  Administered 2015-05-10: 40 mg via INTRAVENOUS
  Filled 2015-05-10: qty 4

## 2015-05-10 MED ORDER — HYDRALAZINE HCL 25 MG PO TABS
25.0000 mg | ORAL_TABLET | Freq: Three times a day (TID) | ORAL | Status: DC
  Administered 2015-05-10 – 2015-05-14 (×11): 25 mg via ORAL
  Filled 2015-05-10 (×11): qty 1

## 2015-05-10 MED ORDER — HYDRALAZINE HCL 20 MG/ML IJ SOLN
10.0000 mg | Freq: Once | INTRAMUSCULAR | Status: AC
  Administered 2015-05-10: 10 mg via INTRAVENOUS
  Filled 2015-05-10: qty 1

## 2015-05-10 MED ORDER — PINDOLOL 5 MG PO TABS
2.5000 mg | ORAL_TABLET | Freq: Two times a day (BID) | ORAL | Status: DC
  Administered 2015-05-10 – 2015-05-14 (×9): 2.5 mg via ORAL
  Filled 2015-05-10 (×12): qty 1

## 2015-05-10 MED ORDER — TRAZODONE HCL 50 MG PO TABS
25.0000 mg | ORAL_TABLET | Freq: Every evening | ORAL | Status: DC | PRN
  Administered 2015-05-10 – 2015-05-13 (×4): 25 mg via ORAL
  Filled 2015-05-10 (×4): qty 1

## 2015-05-10 NOTE — Consult Note (Signed)
   [facility] Inpatient Consult   05/10/2015  Jacqueline [name] [birth year] [id number] Referral received to assess for care management services.Met with the patient regarding the benefits of [place] [facility] services. Explained that [facility] is a covered benefit of insurance [[employer]] Medicare.    Review information for [facility] Care Management and a folder was provided with contact information.  Explained that [facility] does not interfere with or replace any services arranged by the inpatient care management staff.  Patient requested a brochure and contact information about [place] [facility].  She states, "I remember getting a call about this but my son is the one that helps me to decide on what I need with the insurance things." A brochure with contact information given.  Patient states, "I might have to have a pacemaker before I get home but I feel much better since I lost 11 pounds of fluid."  Encouraged the patient to reach out for this benefit and talk it over with her son.  Did not receive consent at this time. Natividad Brood, RN BSN [facility] Liaison  [phone] business mobile phone

## 2015-05-10 NOTE — Progress Notes (Signed)
Pt had a high BP (192/94), called PA, hydralazine 10mg  IV given, rechecked BP it was 163/64. Pt is asymptomatic, will continue to monitor the patient.

## 2015-05-10 NOTE — Progress Notes (Addendum)
SUBJECTIVE: The patient feels that she is improved today. No chest pain   Shortness of breath much improved following diuresis  Cath >> high filling pressures  ECG QT too long fro dofetilide  Echo 05-07-15 demonstrated EF 123456, grade 2 diastolic dysfunction, moderate MR, PA pressure 85, LA 35.  CURRENT MEDICATIONS: . antiseptic oral rinse  7 mL Mouth Rinse BID  . aspirin EC  81 mg Oral Daily  . furosemide  60 mg Intravenous BID  . heparin subcutaneous  5,000 Units Subcutaneous 3 times per day  . pantoprazole  40 mg Oral Daily  . polyethylene glycol  17 g Oral Daily  . simvastatin  20 mg Oral QHS  . sodium chloride  3 mL Intravenous Q12H  . sodium chloride  3 mL Intravenous Q12H  . sodium chloride  3 mL Intravenous Q12H   . sodium chloride 50 mL/hr at 05/08/15 0838  . sodium chloride 10 mL/hr at 05/09/15 0545    OBJECTIVE: Physical Exam: Filed Vitals:   05/09/15 1722 05/09/15 2133 05/10/15 0209 05/10/15 0607  BP: 175/69 168/72 147/66 158/72  Pulse: 89 95 69 76  Temp: 98.6 F (37 C) 98.8 F (37.1 C) 97.9 F (36.6 C) 98.1 F (36.7 C)  TempSrc: Oral Oral Oral Oral  Resp: 18 19 18 20   Height:      Weight:    134 lb 4.8 oz (60.918 kg)  SpO2: 95% 98% 96% 97%    Intake/Output Summary (Last 24 hours) at 05/10/15 0759 Last data filed at 05/10/15 0600  Gross per 24 hour  Intake    320 ml  Output   7951 ml  Net  -7631 ml    Telemetry reveals sinus rhythm with short run of AF with RVR  GEN- The patient is elderly appearing, alert and oriented x 3 today, sleeping but arouses.   Head- normocephalic, atraumatic Eyes-  Sclera clear, conjunctiva pink Ears- hearing intact Oropharynx- clear Neck- supple  Lungs- Clear to ausculation bilaterally, normal work of breathing Heart- Regular rate and rhythm  GI- soft, NT, ND, + BS Extremities- no clubbing, cyanosis, or edema Skin- no rash or lesion Psych- euthymic mood, full affect Neuro- strength and sensation are  intact  LABS: Basic Metabolic Panel:  Recent Labs  05/08/15 0406 05/09/15 0657 05/10/15 0419  NA 134* 140 140  K 4.7 5.5* 4.2  CL 103 106 102  CO2 24 22 27   GLUCOSE 111* 89 89  BUN 45* 41* 34*  CREATININE 2.38* 1.78* 1.91*  CALCIUM 8.4* 9.4 9.1  MG 2.1  --   --   PHOS 4.7*  --   --    Liver Function Tests: No results for input(s): AST, ALT, ALKPHOS, BILITOT, PROT, ALBUMIN in the last 72 hours. CBC:  Recent Labs  05/09/15 0657 05/10/15 0419  WBC 9.5 6.9  HGB 9.2* 9.9*  HCT 27.7* 29.6*  MCV 94.9 93.7  PLT 159 166   Thyroid Function Tests: No results for input(s): TSH, T4TOTAL, T3FREE, THYROIDAB in the last 72 hours.  Invalid input(s): FREET3  RADIOLOGY: Dg Chest Portable 1 View 05/06/2015  CLINICAL DATA:  Difficulty breathing and weakness. Atrial fibrillation EXAM: PORTABLE CHEST 1 VIEW COMPARISON:  April 23, 2015 FINDINGS: There is minimal scarring in the left base. The lungs elsewhere are clear. Heart size and pulmonary vascularity are normal. No adenopathy. Patient is status post coronary artery bypass grafting. There is postoperative change in the right shoulder. Degenerative changes noted in each shoulder. IMPRESSION:  Minimal scarring left base.  No edema or consolidation. Electronically Signed   By: Lowella Grip III M.D.   On: 05/06/2015 09:50    ASSESSMENT AND PLAN:  Principal Problem:   Tachy-brady syndrome (HCC) Active Problems:   HYPERTENSION, BENIGN SYSTEMIC   CHF NYHA class II (symptoms with moderately strenuous activities) (HCC)   Chronic kidney disease, stage 2, mildly decreased GFR   PAF (paroxysmal atrial fibrillation) (HCC)   Hypotension   Symptomatic bradycardia   Shock circulatory ([place])- due to over medication   Acute on chronic renal insufficiency (HCC)   CAD- s/p CABG   Bradycardia   Arterial hypotension   Leukocytosis   Pain of both shoulder joints   Pulmonary hypertension (HCC)   Anemia of chronic disease    1.  Sinus  bradycardia  HR 60+ over 24 hrs   2.  Hypertension   3.  Persistent atrial fibrillation   4.  Acute on chronic renal insufficiency  Worsening today   5  Pulmonary hypertension  ate    8. CAD No recent ischemic symptoms  9. Anemia   Striking volume overload at cath  Brisk diuresis at cath will decrease to 40 bid x two doses and hope Cr improves with decreasing venous hypertension   NONE written for Sat am  Will try ISA BB for Afib and sinus brady  If progressive bradycardia overweekend would anticipate pacing next week and then aggressvie rate control   If still problematic with rate control could then use amio  Will see what benefit re BP but dont anticipate a lot so will also add low dose amloidpine  Appreciate help from FPTS on multiple medical issues

## 2015-05-10 NOTE — Progress Notes (Signed)
Family Medicine Teaching Service Daily Progress Note Intern Pager: [phone]  Patient name: Jacqueline [name] Medical record number: [id number] Date of birth: 04/24/1931 Age: 81 y.o. Gender: female  Primary Care Provider: Zigmund Gottron, MD Primary Team: Cardiology Code Status: Full code   Assessment and Plan: 81 year old female with past medical history of atrial fibrillation, tachybradycardia syndrome, CAD status post CABG, anemia due to chronic blood loss, CKD stage II, and hypertension admitted to the cardiology service for tachybradycardia syndrome. Family medicine has been consulted for further evaluation of leukocytosis.  1. Tachybradycardia syndrome - Pulse in 50's and 60's. Patient does feel light headed at times when sitting up too quickly.  -primary management per cardiology  2. Pulmonary HTN- PA pressure of 85 per echo on 05/08/15. Negative V/Q scan yesterday. Right heart catheterization yesterday per cardiology team - continue to follow up with cardiology team  2. Leukocytosis - resolved. WBC 17>>>>6.9. S/p 5 day course of Amoxicillin for possible UTI given recent enterococcus infection, patient has been afebrile. Urine and blood cx NG x 48 hrs. There is clinical concern for possible rheumatologic disorder as well with Rheumatoid factor of 24.5 and ESR at 62. ANA negative. Interestingly, CCP elevated to 223. No joint pain or tenderness on exam today but has had previous polyarthralgia's.  - Has outpatient Rheumatology appointment scheduled for 11/7  3. Anemia - Chronic, stable. Baseline 10-11. Hgb 10.1>>>9.9 this hospitalization; Fe studies reveal anemia of chronic disease. B12 is very high and folate is normal. Haptoglobin is high at 230. Blood smear available for view.  - FOBT pending - AM CBC  4. AKI on CKD stage III - Cr 1.78>>1.91 - AM BMP - Consider IV fluids  5. Neck/shoulder tenderness - still present. resolves with Toradol, Tylenol, and Zanaflex.  - Continue  with Toradol, Tylenol, and Zanaflex PRN  FEN/GI: NS @ 89m/hr, NPO PPx: Heparin  Disposition: Home pending cardiology work up  Subjective:  Patient had no acute events overnight. She remained hemodynamically stable. She denies any nausea, vomiting, chest pain, or shortness of breath. She states that she was able to have a bowel movement today. She feels significantly better than yesterday.   Objective: Temp:  [97.9 F (36.6 C)-98.8 F (37.1 C)] 98.1 F (36.7 C) (10/14 0607) Pulse Rate:  [60-175] 76 (10/14 0607) Resp:  [16-65] 20 (10/14 0607) BP: (147-203)/(65-91) 158/72 mmHg (10/14 0607) SpO2:  [0 %-100 %] 97 % (10/14 0607) Weight:  [134 lb 4.8 oz (60.918 kg)] 134 lb 4.8 oz (60.918 kg) (10/14 09794 Physical Exam: Gen.: Pleasant female, in no acute distress. AO x3 Cardiac: Normal rate, irregular rhythm, S1 and S2 present, no murmurs, no heaves or thrills Respiratory: Clear to patient bilaterally, normal effort Abdomen: Soft, nontender, bowel sounds present MSK: Mild paraspinal and trapezius muscle tenderness, cervical range of motion is full  Neuro: No focal defecits  Laboratory:  Recent Labs Lab 05/08/15 0830 05/09/15 0657 05/10/15 0419  WBC 6.7 9.5 6.9  HGB 8.6* 9.2* 9.9*  HCT 26.3* 27.7* 29.6*  PLT 148* 159 166    Recent Labs Lab 05/06/15 0913  05/08/15 0406 05/09/15 0657 05/10/15 0419  NA 137  < > 134* 140 140  K 4.7  < > 4.7 5.5* 4.2  CL 105  < > 103 106 102  CO2 22  < > '24 22 27  ' BUN 48*  < > 45* 41* 34*  CREATININE 2.44*  < > 2.38* 1.78* 1.91*  CALCIUM 8.7*  < > 8.4* 9.4 9.1  PROT 5.9*  --   --   --   --   BILITOT 0.7  --   --   --   --   ALKPHOS 78  --   --   --   --   ALT 146*  --   --   --   --   AST 81*  --   --   --   --   GLUCOSE 134*  < > 111* 89 89  < > = values in this interval not displayed.   Imaging/Diagnostic Tests: No results found.   Carlyle Dolly, MD 05/10/2015, 8:04 AM PGY-1, [facility] Intern  pager: [phone], text pages welcome

## 2015-05-11 LAB — CULTURE, BLOOD (ROUTINE X 2)
Culture: NO GROWTH
Culture: NO GROWTH

## 2015-05-11 LAB — BASIC METABOLIC PANEL
ANION GAP: 19 — AB (ref 5–15)
BUN: 35 mg/dL — ABNORMAL HIGH (ref 6–20)
CALCIUM: 10.1 mg/dL (ref 8.9–10.3)
CO2: 23 mmol/L (ref 22–32)
Chloride: 100 mmol/L — ABNORMAL LOW (ref 101–111)
Creatinine, Ser: 1.8 mg/dL — ABNORMAL HIGH (ref 0.44–1.00)
GFR, EST AFRICAN AMERICAN: 29 mL/min — AB (ref >60)
GFR, EST NON AFRICAN AMERICAN: 25 mL/min — AB (ref >60)
GLUCOSE: 120 mg/dL — AB (ref 65–99)
POTASSIUM: 3.7 mmol/L (ref 3.5–5.1)
Sodium: 142 mmol/L (ref 135–145)

## 2015-05-11 MED ORDER — FUROSEMIDE 20 MG PO TABS: 20.0000 mg | ORAL_TABLET | Freq: Every day | ORAL | Status: DC

## 2015-05-11 MED ORDER — ALPRAZOLAM 0.25 MG PO TABS
0.2500 mg | ORAL_TABLET | Freq: Two times a day (BID) | ORAL | Status: AC | PRN
  Administered 2015-05-11 (×2): 0.25 mg via ORAL
  Filled 2015-05-11 (×2): qty 1

## 2015-05-11 NOTE — Progress Notes (Signed)
Family Medicine Teaching Service Daily Progress Note Intern Pager: [phone]  Patient name: Jacqueline [name] Medical record number: [id number] Date of birth: Jul 26, 1931 Age: 81 y.o. Gender: female  Primary Care Provider: Zigmund Gottron, MD Primary Team: Cardiology Code Status: Full code   Assessment and Plan: 81 year old female with past medical history of atrial fibrillation, tachybradycardia syndrome, CAD status post CABG, anemia due to chronic blood loss, CKD stage II, and hypertension admitted to the cardiology service for tachybradycardia syndrome. Family medicine has been consulted for further evaluation of leukocytosis.  1. Tachybradycardia syndrome/HTN/Persistant A-fib -primary management per cardiology  2. Pulmonary HTN- PA pressure of 85 per echo on 05/08/15. Negative V/Q scan yesterday. Right heart catheterization yesterday per cardiology team - continue to follow up with cardiology team  2. Leukocytosis - resolved. WBC 17>>>>6.9. S/p 5 day course of Amoxicillin for possible UTI given recent enterococcus infection, patient has been afebrile. Urine and blood cx NG x 48 hrs. There is clinical concern for possible rheumatologic disorder as well with Rheumatoid factor of 24.5 and ESR at 62. ANA negative. Interestingly, CCP elevated to 223. No joint pain or tenderness on exam today but has had previous polyarthralgia's.  - Has outpatient Rheumatology appointment scheduled for 11/7 - Will sign off at this time   3. Anemia - Chronic, stable. Baseline 10-11. Hgb 10.1>>>9.9 this hospitalization; Fe studies reveal anemia of chronic disease. B12 is very high and folate is normal. Haptoglobin is high at 230. Blood smear available for view.  - Continue to monitor with future CBC   4. AKI on CKD stage III - Cr 1.80 - AM BMP - Consider IV fluids  5. Neck/shoulder tenderness - still present. resolves with Toradol, Tylenol, and Zanaflex.  - Continue with Toradol, Tylenol, and Zanaflex  PRN  FEN/GI:  Heart healthy  PPx: Heparin  Disposition: Thank you for this consult, will sign off for now. Please contact family medicine residency if you have further questions/concerns.   Subjective:  Patient had no acute events overnight. She remained hemodynamically stable. She denies any nausea, vomiting, chest pain, or shortness of breath. She states that she was able to have a bowel movement today. She feels significantly better than yesterday.   Objective: Temp:  [98.3 F (36.8 C)-98.9 F (37.2 C)] 98.3 F (36.8 C) (10/15 0432) Pulse Rate:  [64-92] 87 (10/15 0432) Resp:  [20-21] 20 (10/15 0432) BP: (128-192)/(64-94) 138/75 mmHg (10/15 0432) SpO2:  [96 %-100 %] 98 % (10/15 0432) Weight:  [123 lb 11.2 oz (56.11 kg)] 123 lb 11.2 oz (56.11 kg) (10/15 0432) Physical Exam: Gen.: Pleasant female, in no acute distress. AO x3 Cardiac: RRR, S1 and S2 present, no murmurs, no heaves or thrills Respiratory: Clear to patient bilaterally, normal effort Abdomen: Soft, nontender, bowel sounds present MSK: Mild paraspinal and trapezius muscle tenderness, cervical range of motion is full  Neuro: No focal defecits  Laboratory:  Recent Labs Lab 05/08/15 0830 05/09/15 0657 05/10/15 0419  WBC 6.7 9.5 6.9  HGB 8.6* 9.2* 9.9*  HCT 26.3* 27.7* 29.6*  PLT 148* 159 166    Recent Labs Lab 05/06/15 0913  05/09/15 0657 05/10/15 0419 05/11/15 0451  NA 137  < > 140 140 142  K 4.7  < > 5.5* 4.2 3.7  CL 105  < > 106 102 100*  CO2 22  < > _0 BUN 48*  < > 41* 34* 35*  CREATININE 2.44*  < > 1.78* 1.91* 1.80*  CALCIUM 8.7*  < >  9.4 9.1 10.1  PROT 5.9*  --   --   --   --   BILITOT 0.7  --   --   --   --   ALKPHOS 78  --   --   --   --   ALT 146*  --   --   --   --   AST 81*  --   --   --   --   GLUCOSE 134*  < > 89 89 120*  < > = values in this interval not displayed.   Imaging/Diagnostic Tests: No results found.   Carlyle Dolly, MD 05/11/2015, 8:19 AM PGY-1, [facility] Intern pager: [phone], text pages welcome

## 2015-05-11 NOTE — Progress Notes (Signed)
Patient Name: Jacqueline [name] Date of Encounter: 05/11/2015     Principal Problem:   Tachy-brady syndrome ([place]) Active Problems:   HYPERTENSION, BENIGN SYSTEMIC   CHF NYHA class II (symptoms with moderately strenuous activities) (HCC)   Chronic kidney disease, stage 2, mildly decreased GFR   PAF (paroxysmal atrial fibrillation) (HCC)   Hypotension   Symptomatic bradycardia   Shock circulatory ([place])- due to over medication   Acute on chronic renal insufficiency (HCC)   CAD- s/p CABG   Bradycardia   Arterial hypotension   Leukocytosis   Pain of both shoulder joints   Pulmonary hypertension ([place])   Anemia of chronic disease    SUBJECTIVE  Patient feels much better today.  She was found to have significant volume overload at catheter and responded to IV Lasix.  Her breathing is much improved.  She is in normal sinus rhythm today.  Pindolol was started yesterday.  She denies any chest pain.  No peripheral edema.  She has lost 22 pounds since admission.  CURRENT MEDS . antiseptic oral rinse  7 mL Mouth Rinse BID  . aspirin EC  81 mg Oral Daily  . heparin subcutaneous  5,000 Units Subcutaneous 3 times per day  . hydrALAZINE  25 mg Oral 3 times per day  . pantoprazole  40 mg Oral Daily  . pindolol  2.5 mg Oral BID  . polyethylene glycol  17 g Oral Daily  . simvastatin  20 mg Oral QHS  . sodium chloride  3 mL Intravenous Q12H  . sodium chloride  3 mL Intravenous Q12H  . sodium chloride  3 mL Intravenous Q12H    OBJECTIVE  Filed Vitals:   05/10/15 1800 05/10/15 2102 05/11/15 0432 05/11/15 0947  BP: 163/64 128/88 138/75 138/67  Pulse: 64 92 87 91  Temp:  98.8 F (37.1 C) 98.3 F (36.8 C)   TempSrc:  Oral Oral   Resp:  20 20   Height:      Weight:   123 lb 11.2 oz (56.11 kg)   SpO2: 96% 100% 98%     Intake/Output Summary (Last 24 hours) at 05/11/15 1028 Last data filed at 05/11/15 0858  Gross per 24 hour  Intake    960 ml  Output   2802 ml  Net  -1842 ml    Filed Weights   05/09/15 0456 05/10/15 0607 05/11/15 0432  Weight: 145 lb (65.772 kg) 134 lb 4.8 oz (60.918 kg) 123 lb 11.2 oz (56.11 kg)    PHYSICAL EXAM  General: Pleasant, NAD. Neuro: Alert and oriented X 3. Moves all extremities spontaneously. Psych: Normal affect. HEENT:  Normal  Neck: Supple without bruits or JVD. Lungs:  Resp regular and unlabored, CTA. Heart: RRR no s3, s4, or murmurs. Abdomen: Soft, non-tender, non-distended, BS + x 4.  Extremities: No clubbing, cyanosis or edema. DP/PT/Radials 2+ and equal bilaterally.  Accessory Clinical Findings  CBC  Recent Labs  05/09/15 0657 05/10/15 0419  WBC 9.5 6.9  HGB 9.2* 9.9*  HCT 27.7* 29.6*  MCV 94.9 93.7  PLT 159 [id number]   Basic Metabolic Panel  Recent Labs  05/10/15 0419 05/11/15 0451  NA 140 142  K 4.2 3.7  CL 102 100*  CO2 27 23  GLUCOSE 89 120*  BUN 34* 35*  CREATININE 1.91* 1.80*  CALCIUM 9.1 10.1   Liver Function Tests No results for input(s): AST, ALT, ALKPHOS, BILITOT, PROT, ALBUMIN in the last 72 hours. No results for input(s): LIPASE, AMYLASE  in the last 72 hours. Cardiac Enzymes No results for input(s): CKTOTAL, CKMB, CKMBINDEX, TROPONINI in the last 72 hours. BNP Invalid input(s): POCBNP D-Dimer No results for input(s): DDIMER in the last 72 hours. Hemoglobin A1C No results for input(s): HGBA1C in the last 72 hours. Fasting Lipid Panel No results for input(s): CHOL, HDL, LDLCALC, TRIG, CHOLHDL, LDLDIRECT in the last 72 hours. Thyroid Function Tests No results for input(s): TSH, T4TOTAL, T3FREE, THYROIDAB in the last 72 hours.  Invalid input(s): FREET3  TELE  Normal sinus rhythm  ECG    Radiology/Studies  Dg Chest 2 View  05/09/2015  CLINICAL DATA:  Shortness of breath EXAM: CHEST  2 VIEW COMPARISON:  Portable chest x-ray of May 06, 2015, and PA and lateral chest x-ray of April 23, 2015 FINDINGS: The lungs are adequately inflated. There is no focal infiltrate. The  interstitial markings have increased bilaterally. There are small bilateral pleural effusions layering posteriorly. The heart is normal in size. The pulmonary vascularity exhibits mild cephalization. There are 7 intact sternal wires from previous CABG. There is mild multilevel degenerative disc disease of the thoracic spine. There are degenerative changes of both shoulders. IMPRESSION: Increased pulmonary interstitial markings may reflect interstitial edema or less likely interstitial pneumonia. There are new small bilateral pleural effusions. Electronically Signed   By: David  Martinique M.D.   On: 05/09/2015 08:05   Dg Chest 2 View  04/23/2015  CLINICAL DATA:  [name] in the neck for 1 week with generalized body pain and hand swelling for 1 day. History of hypertension. Initial encounter. EXAM: CHEST  2 VIEW COMPARISON:  Radiographs 02/17/2015 and 09/15/2014. FINDINGS: The heart size and mediastinal contours are stable status post CABG. Interstitial edema and small bilateral pleural effusions noted on the most recent study have resolved. There is mild linear atelectasis or scarring at the left lung base. No confluent airspace opacity or pneumothorax. The bones appear unchanged. Postsurgical changes are present at the right shoulder. IMPRESSION: Interval resolution of interstitial edema and pleural effusions. No acute cardiopulmonary process. Electronically Signed   By: Richardean Sale M.D.   On: 04/23/2015 12:59   Mr Cervical Spine Wo Contrast  04/23/2015  CLINICAL DATA:  Neck pain and generalized weakness. EXAM: MRI CERVICAL SPINE WITHOUT CONTRAST TECHNIQUE: Multiplanar, multisequence MR imaging of the cervical spine was performed. No intravenous contrast was administered. COMPARISON:  None. FINDINGS: Image quality degraded by mild motion Normal alignment. Negative for fracture or mass lesion. Spinal cord signal is normal. No cord lesion or cord compression. Cervical medullary junction normal. C2-3:  Negative  C3-4: Disc degeneration and spondylosis with diffuse uncinate spurring. Mild facet degeneration bilaterally. Mild foraminal narrowing bilaterally. C4-5: Disc degeneration and spondylosis. Mild foraminal narrowing bilaterally. Bilateral facet degeneration. Mild spinal stenosis. C5-6: Disc degeneration and spondylosis. Mild foraminal narrowing and mild spinal stenosis C6-7: Disc degeneration and spurring left greater than right. Flattening of the cord on the left due to osteophyte. Mild foraminal narrowing on the left. C7-T1:  Negative for spinal stenosis.  Perineural cyst on the left. IMPRESSION: Cervical spondylosis. Mild spinal stenosis and mild foraminal stenosis throughout the cervical spine as described above. No acute disc protrusion. Electronically Signed   By: Franchot Gallo M.D.   On: 04/23/2015 13:09   Nm Pulmonary Perf And Vent  05/08/2015  CLINICAL DATA:  Shortness of breath.  Weakness. EXAM: NUCLEAR MEDICINE VENTILATION - PERFUSION LUNG SCAN TECHNIQUE: Ventilation images were obtained in multiple projections using inhaled aerosol Tc-32m DTPA. Perfusion images were obtained  in multiple projections after intravenous injection of Tc-28m MAA. RADIOPHARMACEUTICALS:  6.0 mCi of Technetium-25m DTPA aerosol inhalation and 42 mCi of Technetium-7m MAA IV COMPARISON:  Chest x-ray dated 05/06/2015 and chest CT dated 06/07/2005 FINDINGS: Ventilation: No focal ventilation defect. Perfusion: No wedge shaped peripheral perfusion defects to suggest acute pulmonary embolism. IMPRESSION: Normal ventilation-perfusion lung scan. No evidence of pulmonary embolism. Electronically Signed   By: Lorriane Shire M.D.   On: 05/08/2015 12:22   Dg Chest Portable 1 View  05/06/2015  CLINICAL DATA:  Difficulty breathing and weakness. Atrial fibrillation EXAM: PORTABLE CHEST 1 VIEW COMPARISON:  April 23, 2015 FINDINGS: There is minimal scarring in the left base. The lungs elsewhere are clear. Heart size and pulmonary  vascularity are normal. No adenopathy. Patient is status post coronary artery bypass grafting. There is postoperative change in the right shoulder. Degenerative changes noted in each shoulder. IMPRESSION: Minimal scarring left base.  No edema or consolidation. Electronically Signed   By: Lowella Grip III M.D.   On: 05/06/2015 09:50    ASSESSMENT AND PLAN 1. Marked volume overload, improved.  2. Hypertension, labile, trend is improving  3. Paroxysmal atrial fibrillation, holding normal sinus rhythm on pindolol  4. Acute on chronic renal insufficiency, stable after IV diuresis  5 Pulmonary hypertension  Plan: Continue pindolol.  Resume low dose furosemide.  Continue to follow renal function closely.  Signed, Darlin Coco MD

## 2015-05-12 LAB — BASIC METABOLIC PANEL
Anion gap: 16 — ABNORMAL HIGH (ref 5–15)
BUN: 43 mg/dL — ABNORMAL HIGH (ref 6–20)
CALCIUM: 10.1 mg/dL (ref 8.9–10.3)
CO2: 28 mmol/L (ref 22–32)
CREATININE: 2.17 mg/dL — AB (ref 0.44–1.00)
Chloride: 95 mmol/L — ABNORMAL LOW (ref 101–111)
GFR, EST AFRICAN AMERICAN: 23 mL/min — AB (ref >60)
GFR, EST NON AFRICAN AMERICAN: 20 mL/min — AB (ref >60)
Glucose, Bld: 107 mg/dL — ABNORMAL HIGH (ref 65–99)
Potassium: 3.7 mmol/L (ref 3.5–5.1)
SODIUM: 139 mmol/L (ref 135–145)

## 2015-05-12 MED ORDER — ALPRAZOLAM 0.25 MG PO TABS
0.2500 mg | ORAL_TABLET | Freq: Two times a day (BID) | ORAL | Status: DC | PRN
  Administered 2015-05-12 – 2015-05-13 (×3): 0.25 mg via ORAL
  Filled 2015-05-12 (×3): qty 1

## 2015-05-12 MED ORDER — POLYVINYL ALCOHOL 1.4 % OP SOLN
1.0000 [drp] | OPHTHALMIC | Status: DC | PRN
  Administered 2015-05-12 – 2015-05-13 (×3): 1 [drp] via OPHTHALMIC
  Filled 2015-05-12: qty 15

## 2015-05-12 NOTE — Progress Notes (Signed)
Patient Name: Jacqueline [name] Date of Encounter: 05/12/2015     Principal Problem:   Tachy-brady syndrome ([place]) Active Problems:   HYPERTENSION, BENIGN SYSTEMIC   CHF NYHA class II (symptoms with moderately strenuous activities) (HCC)   Chronic kidney disease, stage 2, mildly decreased GFR   PAF (paroxysmal atrial fibrillation) (HCC)   Hypotension   Symptomatic bradycardia   Shock circulatory (HCC)- due to over medication   Acute on chronic renal insufficiency (HCC)   CAD- s/p CABG   Bradycardia   Arterial hypotension   Leukocytosis   Pain of both shoulder joints   Pulmonary hypertension (HCC)   Anemia of chronic disease    SUBJECTIVE  The patient is not having any chest pain or shortness of breath.  She has not been aware of any palpitations or tachycardia.  She does complain of a poor appetite.  She has not yet walked in the hall.  She noticed a decrease in urine output yesterday.  Her weight is unchanged from yesterday at 125 pounds.  CURRENT MEDS . antiseptic oral rinse  7 mL Mouth Rinse BID  . aspirin EC  81 mg Oral Daily  . heparin subcutaneous  5,000 Units Subcutaneous 3 times per day  . hydrALAZINE  25 mg Oral 3 times per day  . pantoprazole  40 mg Oral Daily  . pindolol  2.5 mg Oral BID  . polyethylene glycol  17 g Oral Daily  . simvastatin  20 mg Oral QHS  . sodium chloride  3 mL Intravenous Q12H  . sodium chloride  3 mL Intravenous Q12H  . sodium chloride  3 mL Intravenous Q12H    OBJECTIVE  Filed Vitals:   05/11/15 1406 05/11/15 2026 05/12/15 0424 05/12/15 0428  BP: 143/93 113/55  139/78  Pulse:  101  84  Temp:  98.2 F (36.8 C)  98.2 F (36.8 C)  TempSrc:  Oral  Oral  Resp:  20  20  Height:      Weight:   125 lb 9.6 oz (56.972 kg) 125 lb 6.4 oz (56.881 kg)  SpO2:  95%  99%    Intake/Output Summary (Last 24 hours) at 05/12/15 0804 Last data filed at 05/12/15 0645  Gross per 24 hour  Intake    840 ml  Output    950 ml  Net   -110 ml    Filed Weights   05/11/15 0432 05/12/15 0424 05/12/15 0428  Weight: 123 lb 11.2 oz (56.11 kg) 125 lb 9.6 oz (56.972 kg) 125 lb 6.4 oz (56.881 kg)    PHYSICAL EXAM  General: Pleasant, NAD. Neuro: Alert and oriented X 3. Moves all extremities spontaneously. Psych: Normal affect. HEENT:  Normal  Neck: Supple without bruits or JVD. Lungs:  Resp regular and unlabored, CTA. Heart: RRR no s3, s4, or murmurs. Abdomen: Soft, non-tender, non-distended, BS + x 4.  Extremities: No clubbing, cyanosis or edema. DP/PT/Radials 2+ and equal bilaterally.  Accessory Clinical Findings  CBC  Recent Labs  05/10/15 0419  WBC 6.9  HGB 9.9*  HCT 29.6*  MCV 93.7  PLT [id number]   Basic Metabolic Panel  Recent Labs  05/11/15 0451 05/12/15 0539  NA 142 139  K 3.7 3.7  CL 100* 95*  CO2 23 28  GLUCOSE 120* 107*  BUN 35* 43*  CREATININE 1.80* 2.17*  CALCIUM 10.1 10.1   Liver Function Tests No results for input(s): AST, ALT, ALKPHOS, BILITOT, PROT, ALBUMIN in the last 72 hours. No results for  input(s): LIPASE, AMYLASE in the last 72 hours. Cardiac Enzymes No results for input(s): CKTOTAL, CKMB, CKMBINDEX, TROPONINI in the last 72 hours. BNP Invalid input(s): POCBNP D-Dimer No results for input(s): DDIMER in the last 72 hours. Hemoglobin A1C No results for input(s): HGBA1C in the last 72 hours. Fasting Lipid Panel No results for input(s): CHOL, HDL, LDLCALC, TRIG, CHOLHDL, LDLDIRECT in the last 72 hours. Thyroid Function Tests No results for input(s): TSH, T4TOTAL, T3FREE, THYROIDAB in the last 72 hours.  Invalid input(s): FREET3  TELE  Normal sinus rhythm.  No bradycardia.  No atrial fibrillation.  ECG    Radiology/Studies  Dg Chest 2 View  05/09/2015  CLINICAL DATA:  Shortness of breath EXAM: CHEST  2 VIEW COMPARISON:  Portable chest x-ray of May 06, 2015, and [place] and lateral chest x-ray of April 23, 2015 FINDINGS: The lungs are adequately inflated. There is no focal  infiltrate. The interstitial markings have increased bilaterally. There are small bilateral pleural effusions layering posteriorly. The heart is normal in size. The pulmonary vascularity exhibits mild cephalization. There are 7 intact sternal wires from previous CABG. There is mild multilevel degenerative disc disease of the thoracic spine. There are degenerative changes of both shoulders. IMPRESSION: Increased pulmonary interstitial markings may reflect interstitial edema or less likely interstitial pneumonia. There are new small bilateral pleural effusions. Electronically Signed   By: David  Martinique M.D.   On: 05/09/2015 08:05   Dg Chest 2 View  04/23/2015  CLINICAL DATA:  Jacqueline [name] in the neck for 1 week with generalized body pain and hand swelling for 1 day. History of hypertension. Initial encounter. EXAM: CHEST  2 VIEW COMPARISON:  Radiographs 02/17/2015 and 09/15/2014. FINDINGS: The heart size and mediastinal contours are stable status post CABG. Interstitial edema and small bilateral pleural effusions noted on the most recent study have resolved. There is mild linear atelectasis or scarring at the left lung base. No confluent airspace opacity or pneumothorax. The bones appear unchanged. Postsurgical changes are present at the right shoulder. IMPRESSION: Interval resolution of interstitial edema and pleural effusions. No acute cardiopulmonary process. Electronically Signed   By: Richardean Sale M.D.   On: 04/23/2015 12:59   Mr Cervical Spine Wo Contrast  04/23/2015  CLINICAL DATA:  Neck pain and generalized weakness. EXAM: MRI CERVICAL SPINE WITHOUT CONTRAST TECHNIQUE: Multiplanar, multisequence MR imaging of the cervical spine was performed. No intravenous contrast was administered. COMPARISON:  None. FINDINGS: Image quality degraded by mild motion Normal alignment. Negative for fracture or mass lesion. Spinal cord signal is normal. No cord lesion or cord compression. Cervical medullary junction normal.  C2-3:  Negative C3-4: Disc degeneration and spondylosis with diffuse uncinate spurring. Mild facet degeneration bilaterally. Mild foraminal narrowing bilaterally. C4-5: Disc degeneration and spondylosis. Mild foraminal narrowing bilaterally. Bilateral facet degeneration. Mild spinal stenosis. C5-6: Disc degeneration and spondylosis. Mild foraminal narrowing and mild spinal stenosis C6-7: Disc degeneration and spurring left greater than right. Flattening of the cord on the left due to osteophyte. Mild foraminal narrowing on the left. C7-T1:  Negative for spinal stenosis.  Perineural cyst on the left. IMPRESSION: Cervical spondylosis. Mild spinal stenosis and mild foraminal stenosis throughout the cervical spine as described above. No acute disc protrusion. Electronically Signed   By: Franchot Gallo M.D.   On: 04/23/2015 13:09   Nm Pulmonary Perf And Vent  05/08/2015  CLINICAL DATA:  Shortness of breath.  Weakness. EXAM: NUCLEAR MEDICINE VENTILATION - PERFUSION LUNG SCAN TECHNIQUE: Ventilation images were obtained in multiple  projections using inhaled aerosol Tc-45m DTPA. Perfusion images were obtained in multiple projections after intravenous injection of Tc-38m MAA. RADIOPHARMACEUTICALS:  6.0 mCi of Technetium-2m DTPA aerosol inhalation and 42 mCi of Technetium-30m MAA IV COMPARISON:  Chest x-ray dated 05/06/2015 and chest CT dated 06/07/2005 FINDINGS: Ventilation: No focal ventilation defect. Perfusion: No wedge shaped peripheral perfusion defects to suggest acute pulmonary embolism. IMPRESSION: Normal ventilation-perfusion lung scan. No evidence of pulmonary embolism. Electronically Signed   By: Lorriane Shire M.D.   On: 05/08/2015 12:22   Dg Chest Portable 1 View  05/06/2015  CLINICAL DATA:  Difficulty breathing and weakness. Atrial fibrillation EXAM: PORTABLE CHEST 1 VIEW COMPARISON:  April 23, 2015 FINDINGS: There is minimal scarring in the left base. The lungs elsewhere are clear. Heart size and  pulmonary vascularity are normal. No adenopathy. Patient is status post coronary artery bypass grafting. There is postoperative change in the right shoulder. Degenerative changes noted in each shoulder. IMPRESSION: Minimal scarring left base.  No edema or consolidation. Electronically Signed   By: Lowella Grip III M.D.   On: 05/06/2015 09:50    ASSESSMENT AND PLAN  1. Previous marked volume overload, now probably somewhat dry.  Serum creatinine and BUN have increased overnight after restarting low-dose furosemide 20 mg daily yesterday  2. Hypertension, controlled  3. Paroxysmal atrial fibrillation, holding normal sinus rhythm on pindolol  4. Acute on chronic renal insufficiency, worse after restarting oral Lasix yesterday  5 Pulmonary hypertension  Plan: Continue pindolol. Stop low dose furosemide. Continue to follow renal function closely.  Ambulate in hall today.  Encouraged to drink plenty of fluids today.   Signed, Darlin Coco MD

## 2015-05-13 DIAGNOSIS — I5032 Chronic diastolic (congestive) heart failure: Secondary | ICD-10-CM

## 2015-05-13 LAB — BASIC METABOLIC PANEL
ANION GAP: 12 (ref 5–15)
BUN: 36 mg/dL — AB (ref 6–20)
CO2: 28 mmol/L (ref 22–32)
Calcium: 9.6 mg/dL (ref 8.9–10.3)
Chloride: 99 mmol/L — ABNORMAL LOW (ref 101–111)
Creatinine, Ser: 1.98 mg/dL — ABNORMAL HIGH (ref 0.44–1.00)
GFR calc Af Amer: 26 mL/min — ABNORMAL LOW (ref >60)
GFR, EST NON AFRICAN AMERICAN: 22 mL/min — AB (ref >60)
GLUCOSE: 104 mg/dL — AB (ref 65–99)
POTASSIUM: 3.6 mmol/L (ref 3.5–5.1)
Sodium: 139 mmol/L (ref 135–145)

## 2015-05-13 NOTE — Evaluation (Signed)
Physical Therapy Evaluation & Discharge Patient Details Name: Jacqueline [name] MRN: [id number] DOB: Nov 08, 1930 Today's Date: 05/13/2015   History of Present Illness  Pt is an 81 y/o female admitted with tachy-bradicardia syndrome with weakness and dyspnea.  S/p R heart catheterization 05/09/15.  PMH of HTN, CAD, HLD, GERD, symptomatic A-fib, and severe OA s/p bilateral knee and shoulder replacements.  Clinical Impression  Pt was evaluated by PT during which she was able to perform functional mobility at her baseline including ambulation and stair training.  Pt was educated regarding general LE exercises and safety at home.  Pt has all necessary equipment and no further acute or follow-up PT needed at this time.     Follow Up Recommendations Supervision - Intermittent    Equipment Recommendations  None recommended by PT    Recommendations for Other Services       Precautions / Restrictions Restrictions Weight Bearing Restrictions: No      Mobility  Bed Mobility               General bed mobility comments: pt received sitting in chair  Transfers   Equipment used: None Transfers: Sit to/from Stand Sit to Stand: Independent            Ambulation/Gait Ambulation/Gait assistance: Independent Ambulation Distance (Feet): 200 Feet Assistive device: None Gait Pattern/deviations: WFL(Within Functional Limits);Step-through pattern   Gait velocity interpretation: Below normal speed for age/gender General Gait Details: No SOB or lightheadedness noted.  VSS throughout with O2 sats >95% on room air.  Stairs Stairs: Yes Stairs assistance: Modified independent (Device/Increase time) Stair Management: One rail Right;Forwards;Step to pattern Number of Stairs: 5    Wheelchair Mobility    Modified Rankin (Stroke Patients Only)       Balance   Sitting-balance support: Feet supported;No upper extremity supported Sitting balance-Leahy Scale: Good     Standing balance  support: No upper extremity supported;During functional activity Standing balance-Leahy Scale: Good                               Pertinent Vitals/Pain Pain Assessment: No/denies pain    Home Living Family/patient expects to be discharged to:: Private residence Living Arrangements: Alone Available Help at Discharge: Family;Available PRN/intermittently Type of Home: House Home Access: Stairs to enter Entrance Stairs-Rails: Right Entrance Stairs-Number of Steps: 3 Home Layout: One level Home Equipment: Walker - 2 wheels;Cane - single point;Toilet riser      Prior Function Level of Independence: Independent with assistive device(s) (typically uses no AD but uses RW when feeling weak)               Hand Dominance   Dominant Hand: Right    Extremity/Trunk Assessment   Upper Extremity Assessment: Overall WFL for tasks assessed           Lower Extremity Assessment: Overall WFL for tasks assessed         Communication   Communication: No difficulties  Cognition Arousal/Alertness: Awake/alert Behavior During Therapy: WFL for tasks assessed/performed Overall Cognitive Status: Within Functional Limits for tasks assessed                      General Comments General comments (skin integrity, edema, etc.): Pt educated on safety at home and general LE exercises.    Exercises        Assessment/Plan    PT Assessment Patent does not need any further PT services  PT Diagnosis Other (comment) (dyspnea and weakness)   PT Problem List    PT Treatment Interventions     PT Goals (Current goals can be found in the Care Plan section) Acute Rehab PT Goals Patient Stated Goal: to get home    Frequency     Barriers to discharge        Co-evaluation               End of Session Equipment Utilized During Treatment: Gait belt Activity Tolerance: Patient tolerated treatment well Patient left: in chair;with call bell/phone within  reach Nurse Communication: Mobility status         Time: [id number] PT Time Calculation (min) (ACUTE ONLY): 14 min   Charges:   PT Evaluation $Initial PT Evaluation Tier I: 1 Procedure     PT G CodesEstill Bamberg Tishawna Larouche 06/10/2015, 4:50 PM  Lorita Officer, SPT

## 2015-05-13 NOTE — Progress Notes (Signed)
Patient Name: Jacqueline [name] Date of Encounter: 05/13/2015  Primary Cardiologist: Croitoru  Principal Problem:   Tachy-brady syndrome ([place]) Active Problems:   HYPERTENSION, BENIGN SYSTEMIC   CHF NYHA class II (symptoms with moderately strenuous activities) ([place])   Chronic kidney disease, stage 2, mildly decreased GFR   PAF (paroxysmal atrial fibrillation) (HCC)   Hypotension   Symptomatic bradycardia   Shock circulatory ([place])- due to over medication   Acute on chronic renal insufficiency (HCC)   CAD- s/p CABG   Bradycardia   Arterial hypotension   Leukocytosis   Pain of both shoulder joints   Pulmonary hypertension (HCC)   Anemia of chronic disease   SUBJECTIVE  Feeling well. No chest pain, sob or palpitations. Hasn't walked yet in hallway.   CURRENT MEDS . antiseptic oral rinse  7 mL Mouth Rinse BID  . aspirin EC  81 mg Oral Daily  . heparin subcutaneous  5,000 Units Subcutaneous 3 times per day  . hydrALAZINE  25 mg Oral 3 times per day  . pantoprazole  40 mg Oral Daily  . pindolol  2.5 mg Oral BID  . polyethylene glycol  17 g Oral Daily  . simvastatin  20 mg Oral QHS  . sodium chloride  3 mL Intravenous Q12H  . sodium chloride  3 mL Intravenous Q12H  . sodium chloride  3 mL Intravenous Q12H    OBJECTIVE  Filed Vitals:   05/12/15 1345 05/12/15 2042 05/13/15 0538 05/13/15 0542  BP: 115/54 129/60  127/66  Pulse: 74 78  80  Temp: 98.2 F (36.8 C) 98.9 F (37.2 C)  98.1 F (36.7 C)  TempSrc: Oral Oral  Oral  Resp: 20 20  20   Height:      Weight:   127 lb 13.4 oz (57.988 kg)   SpO2: 97% 99%  97%    Intake/Output Summary (Last 24 hours) at 05/13/15 0832 Last data filed at 05/13/15 0658  Gross per 24 hour  Intake 6723.83 ml  Output    800 ml  Net 5923.83 ml   Filed Weights   05/12/15 0424 05/12/15 0428 05/13/15 0538  Weight: 125 lb 9.6 oz (56.972 kg) 125 lb 6.4 oz (56.881 kg) 127 lb 13.4 oz (57.988 kg)    PHYSICAL EXAM  General: Pleasant,  NAD. Neuro: Alert and oriented X 3. Moves all extremities spontaneously. Psych: Normal affect. HEENT:  Normal  Neck: Supple without bruits or JVD. Lungs:  Resp regular and unlabored, CTA. Heart: RRR no s3, s4, or murmurs. Abdomen: Soft, non-tender, non-distended, BS + x 4.  Extremities: No clubbing, cyanosis or edema. DP/PT/Radials 2+ and equal bilaterally.  Accessory Clinical Findings  CBC No results for input(s): WBC, NEUTROABS, HGB, HCT, MCV, PLT in the last 72 hours. Basic Metabolic Panel  Recent Labs    05/12/15 0539 05/13/15 0241  NA 139 139  K 3.7 3.6  CL 95* 99*  CO2 28 28  GLUCOSE 107* 104*  BUN 43* 36*  CREATININE 2.17* 1.98*  CALCIUM 10.1 9.6   TELE  NSR at rate of 70-80s. Transiently in 100s  Radiology/Studies  Dg Chest 2 View  05/09/2015  CLINICAL DATA:  Shortness of breath EXAM: CHEST  2 VIEW COMPARISON:  Portable chest x-ray of May 06, 2015, and [place] and lateral chest x-ray of April 23, 2015 FINDINGS: The lungs are adequately inflated. There is no focal infiltrate. The interstitial markings have increased bilaterally. There are small bilateral pleural effusions layering posteriorly. The heart is normal  in size. The pulmonary vascularity exhibits mild cephalization. There are 7 intact sternal wires from previous CABG. There is mild multilevel degenerative disc disease of the thoracic spine. There are degenerative changes of both shoulders. IMPRESSION: Increased pulmonary interstitial markings may reflect interstitial edema or less likely interstitial pneumonia. There are new small bilateral pleural effusions. Electronically Signed   By: David  Martinique M.D.   On: 05/09/2015 08:05   Dg Chest 2 View  04/23/2015  CLINICAL DATA:  [name] in the neck for 1 week with generalized body pain and hand swelling for 1 day. History of hypertension. Initial encounter. EXAM: CHEST  2 VIEW COMPARISON:  Radiographs 02/17/2015 and 09/15/2014. FINDINGS: The heart size and  mediastinal contours are stable status post CABG. Interstitial edema and small bilateral pleural effusions noted on the most recent study have resolved. There is mild linear atelectasis or scarring at the left lung base. No confluent airspace opacity or pneumothorax. The bones appear unchanged. Postsurgical changes are present at the right shoulder. IMPRESSION: Interval resolution of interstitial edema and pleural effusions. No acute cardiopulmonary process. Electronically Signed   By: Richardean Sale M.D.   On: 04/23/2015 12:59   Mr Cervical Spine Wo Contrast  04/23/2015  CLINICAL DATA:  Neck pain and generalized weakness. EXAM: MRI CERVICAL SPINE WITHOUT CONTRAST TECHNIQUE: Multiplanar, multisequence MR imaging of the cervical spine was performed. No intravenous contrast was administered. COMPARISON:  None. FINDINGS: Image quality degraded by mild motion Normal alignment. Negative for fracture or mass lesion. Spinal cord signal is normal. No cord lesion or cord compression. Cervical medullary junction normal. C2-3:  Negative C3-4: Disc degeneration and spondylosis with diffuse uncinate spurring. Mild facet degeneration bilaterally. Mild foraminal narrowing bilaterally. C4-5: Disc degeneration and spondylosis. Mild foraminal narrowing bilaterally. Bilateral facet degeneration. Mild spinal stenosis. C5-6: Disc degeneration and spondylosis. Mild foraminal narrowing and mild spinal stenosis C6-7: Disc degeneration and spurring left greater than right. Flattening of the cord on the left due to osteophyte. Mild foraminal narrowing on the left. C7-T1:  Negative for spinal stenosis.  Perineural cyst on the left. IMPRESSION: Cervical spondylosis. Mild spinal stenosis and mild foraminal stenosis throughout the cervical spine as described above. No acute disc protrusion. Electronically Signed   By: Franchot Gallo M.D.   On: 04/23/2015 13:09   Nm Pulmonary Perf And Vent  05/08/2015  CLINICAL DATA:  Shortness of breath.   Weakness. EXAM: NUCLEAR MEDICINE VENTILATION - PERFUSION LUNG SCAN TECHNIQUE: Ventilation images were obtained in multiple projections using inhaled aerosol Tc-67m DTPA. Perfusion images were obtained in multiple projections after intravenous injection of Tc-23m MAA. RADIOPHARMACEUTICALS:  6.0 mCi of Technetium-74m DTPA aerosol inhalation and 42 mCi of Technetium-50m MAA IV COMPARISON:  Chest x-ray dated 05/06/2015 and chest CT dated 06/07/2005 FINDINGS: Ventilation: No focal ventilation defect. Perfusion: No wedge shaped peripheral perfusion defects to suggest acute pulmonary embolism. IMPRESSION: Normal ventilation-perfusion lung scan. No evidence of pulmonary embolism. Electronically Signed   By: Lorriane Shire M.D.   On: 05/08/2015 12:22   Dg Chest Portable 1 View  05/06/2015  CLINICAL DATA:  Difficulty breathing and weakness. Atrial fibrillation EXAM: PORTABLE CHEST 1 VIEW COMPARISON:  April 23, 2015 FINDINGS: There is minimal scarring in the left base. The lungs elsewhere are clear. Heart size and pulmonary vascularity are normal. No adenopathy. Patient is status post coronary artery bypass grafting. There is postoperative change in the right shoulder. Degenerative changes noted in each shoulder. IMPRESSION: Minimal scarring left base.  No edema or consolidation. Electronically Signed  By: Lowella Grip III M.D.   On: 05/06/2015 09:50    ASSESSMENT AND PLAN  1. Previous marked volume overload, now probably somewhat dry.  - Echo 05-07-15 demonstrated EF [id number], grade 2 diastolic dysfunction, moderate MR, PA pressure 85, LA 35. - Striking volume overload at cath10/14/16 --> Brisk diuresis at cath will decrease to 40 bid x two doses. Then developed increased BUN and creatinine after restarting low-dose furosemide 20mg  daily. Lasix held yesterday 05/12/15--> hydrated yesterday--> today both BUN (36 from 43) and creatinine (1.98 from 2.17)) improved.   2. Hypertension, controlled  3.  Paroxysmal atrial fibrillation, holding normal sinus rhythm on pindolol - rate mostly in 70-80 with transiently in 100s.   4. Acute on chronic renal insufficiency, worse after restarting oral Lasix - improving with hydration.   5 Pulmonary hypertension  Plan: Continue pindolol, ASA, hydralazine, statin. Stoped low dose furosemide. Ambulate today with PT. Need to make decision regarding diuretic. Anticipate discharge soon. Not on anticoagulation due to hx of GI bleed.   Jarrett Soho PA-C Pager [phone]  Patient seen and examined. Agree with assessment and plan. Feels tired today; Severe Pulm HTN at R heart cath with PA 77/28, mean 49 and diastolic CHF. Renal fxn is slightly improved today.  Will hold diuretic today and increase hydralazine to 37.5 mg q 8 hrs. F/U renal in am.   Troy Sine, MD, [facility] 05/13/2015 9:11 AM

## 2015-05-13 NOTE — Care Management Important Message (Signed)
Important Message  Patient Details  Name: Jacqueline [name] MRN: [id number] Date of Birth: Jan 08, 1931   Medicare Important Message Given:  Yes-third notification given    Delorse Lek 05/13/2015, 1:18 PM

## 2015-05-14 LAB — BASIC METABOLIC PANEL
Anion gap: 9 (ref 5–15)
BUN: 26 mg/dL — AB (ref 6–20)
CHLORIDE: 107 mmol/L (ref 101–111)
CO2: 24 mmol/L (ref 22–32)
CREATININE: 1.53 mg/dL — AB (ref 0.44–1.00)
Calcium: 9.2 mg/dL (ref 8.9–10.3)
GFR calc Af Amer: 35 mL/min — ABNORMAL LOW (ref >60)
GFR calc non Af Amer: 31 mL/min — ABNORMAL LOW (ref >60)
Glucose, Bld: 89 mg/dL (ref 65–99)
Potassium: 4 mmol/L (ref 3.5–5.1)
Sodium: 140 mmol/L (ref 135–145)

## 2015-05-14 MED ORDER — HYDRALAZINE HCL 25 MG PO TABS: 37.5000 mg | ORAL_TABLET | Freq: Three times a day (TID) | ORAL | Status: DC

## 2015-05-14 MED ORDER — PINDOLOL 5 MG PO TABS: 2.5000 mg | ORAL_TABLET | Freq: Two times a day (BID) | ORAL | Status: DC

## 2015-05-14 MED ORDER — FUROSEMIDE 20 MG PO TABS: 20.0000 mg | ORAL_TABLET | Freq: Every day | ORAL | Status: DC | PRN

## 2015-05-14 NOTE — Discharge Summary (Signed)
Physician Discharge Summary    Cardiologist: Croitoru  Patient ID: Jacqueline [name] MRN: [id number] DOB/AGE: March 07, 1931 81 y.o.  Admit date: 05/06/2015 Discharge date: 05/14/2015  Admission Diagnoses:  Tachy-brady syndrome  Discharge Diagnoses:  Principal Problem:   Tachy-brady syndrome ([place]) Active Problems:   HYPERTENSION, BENIGN SYSTEMIC   CHF NYHA class II (symptoms with moderately strenuous activities) (HCC)   Chronic kidney disease, stage 2, mildly decreased GFR   PAF (paroxysmal atrial fibrillation) (HCC)   Hypotension   Symptomatic bradycardia   Shock circulatory ([place])- due to over medication   Acute on chronic renal insufficiency (HCC)   CAD- s/p CABG   Bradycardia   Arterial hypotension   Leukocytosis   Pain of both shoulder joints   Pulmonary hypertension ([place])   Anemia of chronic disease   Discharged Condition: stable  Hospital Course:   This is a 81 y.o. female with a past medical history significant for CAD s/p coronary artery bypass surgery (LIMA to LAD and SVG to diagonal 2, Dr. Servando Snare; f/u cath 08/2014 atretic LIMA but good distal flow via the other conduits) and has normal left ventricular systolic function and a relatively recent normal nuclear perfusion study. She had highly symptomatic atrial fibrillation rapid ventricular response in August 2015 and underwent TEE-guided cardioversion followed by treatment with Eliquis. She does not have a history of stroke or TIA. ELIQUIS was stopped due to GI bleed in July. Has not been restarted as the patient was not interested in restarting. Her beta blocker doses of any increased over the last several months with the most recent increase being October 5th for A. fib RVR (increased to metoprolol 100 mg twice a day by her PCP) -- this was in response to her recent hospitalization where she was diagnosed with polymyalgia rheumatica/acute inflammatory polyarthralgia. The following day, her enalapril dose was reduced.  She  was also noted to have enterococcal UTI in the hospital started on Keflex - converted to amoxicillin.  She presented to the emergency room with the report of weakness and dyspnea for several days. His also noted increased fatigue. She recently had her blood pressure medications adjusted with metoprolol increased, enalapril decreased in morphine stopped. She again feels notably worse yesterday (10/9) and noted that her blood pressure was low in the 70s with heart rates in the 50s.  When she continued to feel poorly, she came to the emergency room.  Initial evaluation of telemetry in the emergency room showed..?(dictation error on H&P), with sinus bradycardia. Rates were as low as low 40s. She appeared pale, lethargic with minimal responsiveness.  She was mildly hypoxic requiring oxygen. She was also noted to be hypotensive with blood pressures in the 50s-70s mmHg.  Upon initial evaluation she had been given glucagon and 1 ampule of atropine along with a 500 mL bolus.  She remained somewhat lethargic, but heart rate was showing signs of having sinus rhythm/sinus bradycardia with intermittent junctional escape rhythm with rates in the 50s. She was started on dopamine with increased dose up to 20 mcg to counteract effect of beta blocker.   By the time she left emergency room, her heart rate was stable in the 60-70 bpm range with blood pressures in the high 90s.   The patient was admitted to the CCU.  Dopamine was weaned off early in her stay.  Eliquis was never restarted due to history of GI bleed.  ASA and statin continued.  Amoxicillin was continued to complete 10 day course.  Echocardiogram revealed an  EF of 60-65% with G2DD, moderate MR, Mod TR, LA mod dilation, RA mild dilation, peak PA pressure 56mHg.  Negative VQ scan.   She was seen for consult by EP.  Initial consideration were given for dofetilide and amiodarone for afib.  She ended up being started on pindolol 2.560m  Bradycardia resolved.  She is still  having short runs of sinus tach to 150 which last about 3-4 seconds.  Titration of pindolol may be required.  HR monitoring for bradycardia may also be needed and eventual PPM.  Negative VQ scan.  She underwent a right heart cath which revealed elevated left and right heart filling pressures with prominent v-waves in the PCWP tracing. Mixed pulmonary hypertension.  Dr. McAundra Dubinuspected primarily pulmonary venous hypertension but possible pulmonary arterial hypertension component with PVR 3.7 WU.  He thought that the primary problem was diastolic CHF. Creatinine was better the day of the cath and she was given  Lasix 60 mg IV bid. Net fluids:  -3.5L for the admission.  She had a problem with acute on chronic renal injury and lasix was stopped and she was started on IV fluids on 10/16 with improvement in SCr.   Hydralazine was increase to 37.21m24mID  Family medicine was consulted for leukocytosis, anemia, A on CKI, neck should pain.  Urine culture was negative and leukocytosis resolved. ESR was elevated, ANA negative.  Anti CCP and RHf elevated.  Has outpatient Rheumatology appointment scheduled for 11/7.  Fe studies reveal anemia of chronic disease. B12 is very high and folate is normal. Haptoglobin is high at 230.    The patient was seen by Dr. KelClaiborne Billingso felt she was stable for DC home.  Restart PO lasix 82m38mily PRN tomorrow and recheck BMET on Friday.  Daily weight monitoring and low sodium diet was discussed.  Consults:  PT, EP, Family medicine.   Significant Diagnostic Studies:    Right Heart Cath  Conclusion    1. Elevated left and right heart filling pressures with prominent v-waves in the PCWP tracing.  2. Mixed pulmonary hypertension, I suspect primarily pulmonary venous hypertension but possible pulmonary arterial hypertension component with PVR 3.7 WU.   I think that the primary problem here is diastolic CHF. Creatinine is better today, needs diuresis. Will write for Lasix 60 mg  IV bid.     Technique and Indications    Procedure: Right Heart Cath  Indication: Pulmonary hypertension   Procedural Details: The right brachial area was prepped, draped, and anesthetized with 1% lidocaine. There was a pre-existing peripheral IV in the right brachial area. A Swan-Ganz catheter was used for the right heart catheterization. Standard protocol was followed for recording of right heart pressures and sampling of oxygen saturations. Fick cardiac output was calculated. There were no immediate procedural complications. The patient was transferred to the post catheterization recovery area for further monitoring.  Estimated blood loss <50 mL.     Right Heart Pressures RHC Procedural Findings: Hemodynamics (mmHg) RA mean 17 RV 77/21 PA 77/28, mean 49 PCWP mean 27 with prominent v-waves  Oxygen saturations: PA 65% AO 97%  Cardiac Output (Fick) 5.86  Cardiac Index (Fick) 3.55 PVR 3.7 WU   Echocardiogram. 05/07/15 Study Conclusions  - Left ventricle: The cavity size was normal. There was mild concentric hypertrophy. Systolic function was normal. The estimated ejection fraction was in the range of 60% to 65%. Wall motion was normal; there were no regional wall motion abnormalities. Features are consistent with a pseudonormal  left ventricular filling pattern, with concomitant abnormal relaxation and increased filling pressure (grade 2 diastolic dysfunction). - Aortic valve: There was trivial regurgitation. - Mitral valve: There was moderate regurgitation. - Left atrium: The atrium was moderately dilated. - Right atrium: The atrium was mildly dilated. - Tricuspid valve: There was moderate regurgitation. - Pulmonary arteries: Systolic pressure was severely increased. PA peak pressure: 85 mm Hg (S).   Treatments: See above  Discharge Exam: Blood pressure 133/55, pulse 67, temperature 97.7 F (36.5 C), temperature source Oral, resp. rate 18, height 5'  1" (1.549 m), weight 130 lb 5.4 oz (59.122 kg), SpO2 97 %.   Disposition: 01-Home or Self Care      Discharge Instructions    Diet - low sodium heart healthy    Complete by:  As directed      Discharge instructions    Complete by:  As directed   Monitor your weight every morning.  If you gain 3 pounds in 24 hours, or 5 pounds in a week, take the lasix     Increase activity slowly    Complete by:  As directed             Medication List    STOP taking these medications        amoxicillin 500 MG capsule  Commonly known as:  AMOXIL     celecoxib 100 MG capsule  Commonly known as:  CELEBREX     enalapril 10 MG tablet  Commonly known as:  VASOTEC     metoprolol 100 MG tablet  Commonly known as:  LOPRESSOR     rOPINIRole 0.25 MG tablet  Commonly known as:  REQUIP     vitamin B-12 100 MCG tablet  Commonly known as:  CYANOCOBALAMIN      TAKE these medications        acetaminophen 500 MG tablet  Commonly known as:  TYLENOL  Take 500-1,000 mg by mouth every 6 (six) hours as needed for moderate pain or headache.     alendronate 70 MG tablet  Commonly known as:  FOSAMAX  Take 1 tablet (70 mg total) by mouth once a week. Take with a full glass of water on an empty stomach, remain upright     ALPRAZolam 0.5 MG tablet  Commonly known as:  XANAX  Take 1 tablet (0.5 mg total) by mouth 2 (two) times daily as needed for anxiety.     aspirin EC 81 MG tablet  Take 81 mg by mouth daily.     diclofenac sodium 1 % Gel  Commonly known as:  VOLTAREN  Apply 2 g topically 3 (three) times daily as needed (pain).     diphenhydrAMINE 25 MG tablet  Commonly known as:  BENADRYL  Take 50 mg by mouth 2 (two) times daily as needed for allergies (eye allergies).     Fish Oil 1000 MG Caps  Take 1-2 capsules by mouth 2 (two) times daily. Takes one in the morning and two capsules at night.     furosemide 20 MG tablet  Commonly known as:  LASIX  Take 1 tablet (20 mg total) by mouth  daily as needed for fluid.     GERITOL COMPLETE PO  Take 1 tablet by mouth daily.     hydrALAZINE 25 MG tablet  Commonly known as:  APRESOLINE  Take 1.5 tablets (37.5 mg total) by mouth every 8 (eight) hours.     HYDROcodone-acetaminophen 5-325 MG tablet  Commonly known as:  NORCO/VICODIN  Take 1-2 tablets by mouth every 4 (four) hours as needed for severe pain. Please do not take at the same time as Tramadol.     nitroGLYCERIN 0.4 MG SL tablet  Commonly known as:  NITROSTAT  Place 1 tablet (0.4 mg total) under the tongue every 5 (five) minutes as needed for chest pain.     omeprazole 20 MG capsule  Commonly known as:  PRILOSEC  Take 1 capsule (20 mg total) by mouth daily.     pindolol 5 MG tablet  Commonly known as:  VISKEN  Take 0.5 tablets (2.5 mg total) by mouth 2 (two) times daily.     polyethylene glycol packet  Commonly known as:  MIRALAX / GLYCOLAX  Take 17 g by mouth 2 (two) times daily.     PROBIOTIC PO  Take 1 capsule by mouth daily.     simvastatin 40 MG tablet  Commonly known as:  ZOCOR  Take 0.5 tablets (20 mg total) by mouth at bedtime.     tiZANidine 4 MG capsule  Commonly known as:  ZANAFLEX  Take 4 mg by mouth at bedtime as needed for muscle spasms.     traMADol 50 MG tablet  Commonly known as:  ULTRAM  TAKE 1 TABLET BY MOUTH TWICE A DAY     traZODone 50 MG tablet  Commonly known as:  DESYREL  Take 1 tablet (50 mg total) by mouth at bedtime.     Vitamin D-3 1000 UNITS Caps  Take 2,000 Units by mouth 2 (two) times daily.       Follow-up Information    Follow up with Sanda Klein, MD On 05/16/2015.   Specialty:  Cardiology   Why:  2:15 PM   Contact information:   [address] [place] [address] [phone]      Greater than 30 minutes was spent completing the patient's discharge.    SignedTarri Fuller, [place] 05/14/2015, 10:13 AM

## 2015-05-14 NOTE — Progress Notes (Signed)
Subjective: No SOB, CP, PND  Objective: Vital signs in last 24 hours: Temp:  [97.7 F (36.5 C)-98.8 F (37.1 C)] 97.7 F (36.5 C) (10/18 0451) Pulse Rate:  [66-89] 67 (10/18 0451) Resp:  [18] 18 (10/18 0451) BP: (113-134)/(51-74) 133/55 mmHg (10/18 0451) SpO2:  [97 %-98 %] 97 % (10/18 0451) Weight:  [130 lb 5.4 oz (59.122 kg)] 130 lb 5.4 oz (59.122 kg) (10/18 0447) Last BM Date: 05/14/15  Intake/Output from previous day: 10/17 0701 - 10/18 0700 In: 1980 [P.O.:1980] Out: 1300 [Urine:1300] Intake/Output this shift:    Medications Scheduled Meds: . antiseptic oral rinse  7 mL Mouth Rinse BID  . aspirin EC  81 mg Oral Daily  . heparin subcutaneous  5,000 Units Subcutaneous 3 times per day  . hydrALAZINE  25 mg Oral 3 times per day  . pantoprazole  40 mg Oral Daily  . pindolol  2.5 mg Oral BID  . polyethylene glycol  17 g Oral Daily  . simvastatin  20 mg Oral QHS  . sodium chloride  3 mL Intravenous Q12H  . sodium chloride  3 mL Intravenous Q12H  . sodium chloride  3 mL Intravenous Q12H   Continuous Infusions: . sodium chloride 50 mL/hr at 05/13/15 1059  . sodium chloride Stopped (05/12/15 1018)   PRN Meds:.sodium chloride, sodium chloride, sodium chloride, acetaminophen, acetaminophen, ALPRAZolam, ondansetron (ZOFRAN) IV, polyvinyl alcohol, sodium chloride, sodium chloride, sodium chloride, technetium TC 41M diethylenetriame-pentaacetic acid, tiZANidine, traMADol, traZODone  PE: General appearance: alert, cooperative and no distress Lungs: clear to auscultation bilaterally Heart: regular rate and rhythm, S1, S2 normal, no murmur, click, rub or gallop Extremities: No LEE Pulses: 2+ and symmetric Skin: Warm and dry Neurologic: Grossly normal  Lab Results:  No results for input(s): WBC, HGB, HCT, PLT in the last 72 hours. BMET  Recent Labs  05/12/15 0539 05/13/15 0241 05/14/15 0316  NA 139 139 140  K 3.7 3.6 4.0  CL 95* 99* 107  CO2 28 28 24   GLUCOSE  107* 104* 89  BUN 43* 36* 26*  CREATININE 2.17* 1.98* 1.53*  CALCIUM 10.1 9.6 9.2    Assessment/Plan  Principal Problem:   Tachy-brady syndrome (HCC) Active Problems:   HYPERTENSION, BENIGN SYSTEMIC   CHF NYHA class II (symptoms with moderately strenuous activities) (HCC)   Chronic kidney disease, stage 2, mildly decreased GFR   PAF (paroxysmal atrial fibrillation) (HCC)   Hypotension   Symptomatic bradycardia   Shock circulatory (HCC)- due to over medication   Acute on chronic renal insufficiency (HCC)   CAD- s/p CABG   Bradycardia   Arterial hypotension   Leukocytosis   Pain of both shoulder joints   Pulmonary hypertension (HCC)   Anemia of chronic disease  1. Acute on chronic diastolic HF  - Echo 0000000 demonstrated EF 123456, grade 2 diastolic dysfunction, moderate MR, PA pressure 85, LA 35. - Striking volume overload at right heart cath10/13/16 --> Brisk diuresis at cath with lasix. Then developed increased BUN and creatinine after restarting low-dose furosemide 20mg  daily. Lasix held  05/12/15 and hydrated  SCr improved 2.17>>1.98>>1.53.   Net fluids: +0.7L/-3.5L Daily weight monitoring and low sodium diet discussed.  Resume 20mg  lasix tomorrow at home.   2. Hypertension, controlled  3. Paroxysmal atrial fibrillation, holding normal sinus rhythm on pindolol - rate mostly in 70-80 . She gets short periods(3-4 seconds) of tachycardia with rate in the 150's  No bradycardia seen in last 24hrs on tele.  4. Acute on  chronic renal insufficiency, worse after restarting oral Lasix - Continues to improve.  Recheck SCr on Friday  5 Pulmonary hypertension   Discharge today.    LOS: 8 days    HAGER, BRYAN PA-C 05/14/2015 8:14 AM  Patient seen and examined. Agree with assessment and plan. Breathing well. Renal function improved to Cr 1.53. Titrate hydralazine to 37.5 mg tid. For DC today; f/u with Dr. [name].   Troy Sine, MD,  [facility] 05/14/2015 9:15 AM

## 2015-05-14 NOTE — Progress Notes (Signed)
Orders received for pt discharge.  Discharge summary printed and reviewed with pt.  Explained medication regimen, and pt had no further questions at this time.  IV removed and site remains clean, dry, intact.  Telemetry removed.  Pt in stable condition and awaiting transport. 

## 2015-05-15 ENCOUNTER — Telehealth: Payer: Self-pay | Admitting: Cardiovascular Disease

## 2015-05-15 NOTE — Telephone Encounter (Signed)
TOC call to patient.She stated she is doing good,just weak.She understands discharge instructions and taking medications as prescribed.Advised to keep post hospital appointment with Dr.Croitoru 05/16/15 at 2:15 pm.

## 2015-05-15 NOTE — Telephone Encounter (Signed)
D/C phone call .Marland Kitchen Appt is 05/16/15 at 2:15pm w/ Dr.Croitoru.  Thanks

## 2015-05-16 ENCOUNTER — Ambulatory Visit (INDEPENDENT_AMBULATORY_CARE_PROVIDER_SITE_OTHER): Payer: PPO | Admitting: Cardiovascular Disease

## 2015-05-16 VITALS — BP 128/60 | HR 72 | Resp 16 | Ht 61.0 in | Wt 132.0 lb

## 2015-05-16 DIAGNOSIS — Z79899 Other long term (current) drug therapy: Secondary | ICD-10-CM

## 2015-05-16 DIAGNOSIS — I495 Sick sinus syndrome: Secondary | ICD-10-CM | POA: Diagnosis not present

## 2015-05-16 DIAGNOSIS — I2510 Atherosclerotic heart disease of native coronary artery without angina pectoris: Secondary | ICD-10-CM

## 2015-05-16 DIAGNOSIS — I480 Paroxysmal atrial fibrillation: Secondary | ICD-10-CM | POA: Diagnosis not present

## 2015-05-16 DIAGNOSIS — I10 Essential (primary) hypertension: Secondary | ICD-10-CM

## 2015-05-16 DIAGNOSIS — I272 Other secondary pulmonary hypertension: Secondary | ICD-10-CM | POA: Diagnosis not present

## 2015-05-16 DIAGNOSIS — I2721 Secondary pulmonary arterial hypertension: Secondary | ICD-10-CM

## 2015-05-16 MED ORDER — HYDRALAZINE HCL 25 MG PO TABS: 12.5000 mg | ORAL_TABLET | Freq: Three times a day (TID) | ORAL | Status: DC

## 2015-05-16 MED ORDER — ISOSORBIDE MONONITRATE ER 30 MG PO TB24: 30.0000 mg | ORAL_TABLET | Freq: Every day | ORAL | Status: DC

## 2015-05-16 NOTE — Patient Instructions (Signed)
Your physician has recommended you make the following change in your medication: STOP CELEBREX, ENALAPRIL AND METOPROLOL.  DECREASE HYDRLAZINE TO 1/2 TABLET THREE TIMES A DAY (EVERY 8 HOURS).  CHANGE ISOSORBIDE DN TO ISOSORBIDE MN 30MG  DAILY.  Your physician recommends that you return for lab work in: [place].  Dr. [name] recommends that you schedule a follow-up appointment in: [place].

## 2015-05-16 NOTE — Progress Notes (Signed)
Patient ID: Jacqueline [name], female   DOB: Sep 07, 1930, 81 y.o.   MRN: [id number]      Cardiology Office Note   Date:  05/17/2015   ID:  Jacqueline [name], DOB 11-28-1930, MRN [id number]  PCP:  Zigmund Gottron, MD  Cardiologist:   Sanda Klein, MD   Chief Complaint  Patient presents with  . 6 MONTHS      History of Present Illness: Jacqueline [name] is a 81 y.o. female who presents for follow-up after hospitalization October 10-18.  She presented to the hospital with extreme weakness and fever and had hypotension and relative sinus bradycardia requiring treatment with intravenous dopamine. Her beta blocker was held.   Notably, prior to the hospitalization and as recently as October 5 her dose of metoprolol had been increased, following a hospitalization when she was diagnosed with acute inflammatory polyarthralgia. She received treatment with relatively high doses of steroids for this. She was also recently diagnosed with and treated for an enterococcal urinary tract infection.  She underwent right heart catheterization on October 13 which showed severe elevation in right and left heart filling pressures. Right atrial mean pressure 17 mmHg Right ventricular pressure 77/21 mmHg Coronary artery pressure 77/28 mm meters Hg (mean 49 mmHg) Pulmonary artery wedge pressure mean 27 mmHg Cardiac output 5.86 L/minute (cardiac index 3.55 L/minute/meter squared)  By echocardiography she continues to have normal left ventricular systolic function with an ejection fraction of 60-65 percent. The echo also showed evidence of elevated left ventricular/left atrial filling pressures (pseudo-normal mitral inflow) and had a remarkably accurate evaluation of her systolic PA pressure at around 85 mm Hg. Moderate mitral insufficiency was described.  She was treated with diuretics but then had to receive intravenous fluids for acute kidney injury. While hospitalized she promises of paroxysmal atrial tachycardia  in the 150s with no clear atrial fibrillation as described  Multiple changes were made her medications. Her metoprolol was discontinued and replaced with pindolol. Hydralazine and nitrates started instead of enalapril.  Her medication list however has multiple duplicates and confusing errors. The metoprolol and enalapril are still listed. She is still taking Celebrex although this could be deleterious in the setting of acute renal insufficiency. Her medication list showed isosorbide dinitrate instead of mononitrate. Although her bottle  Of hydralazine said that she should take 1-1/2 tablets 3 times a day, she has only been taking half a tablet 3 times a day. Despite this her blood pressure is quite normal We tried to clean up all these inaccuracies today. I think at least in part the nitroglycerin sees seem to be related to Jacqueline [name] declining ability to handle a complex list of medications. Thankfully her granddaughter was also present at the appointment today.  Despite all this, Jacqueline [name] looks and feels quite well. She denies dizziness or dyspnea. She does not have any lower extremity edema and her jugular venous pulsations appear to be normal. Ventricular rate is well controlled in the 70s  Past medical history significant for CAD s/p coronary artery bypass surgery (LIMA to LAD and SVG to diagonal 2, Dr. Servando Snare; f/u cath 08/2014 atretic LIMA but good distal flow via the other conduits) and has normal left ventricular systolic function and a relatively recent normal nuclear perfusion study. She had highly symptomatic atrial fibrillation rapid ventricular response in August 2015 and underwent TEE-guided cardioversion followed by treatment with Eliquis. She does not have a history of stroke or TIA. ELIQUIS was stopped due to GI bleed in July. Has  not been restarted at her request.  Past Medical History  Diagnosis Date  . Hypertension   . CAD (coronary artery disease)     a. s/p CABG x 2  (LIMA->LAD, VG->Diag);  b. 08/2014 Cath: LM nl, LAD 90p, LCX nl, RCA nl/dominant, LIMA->LAD atretic, VG->D2 patent w/ retrograde filling of LAD, EF 55-60%-->Med Rx.  Marland Kitchen Hyperlipemia   . Insomnia   . Diverticulitis 02/21/2012  . GERD (gastroesophageal reflux disease)   . Atrial fibrillation with rapid ventricular response ([place]) 02/2014    a. CHA2DS2VASc = 6 -> on eliquis;  b. 02/2014 s/p DCCV;  c. 08/2014 Echo: EF 55-60%, Gr 2 DD, mild MR, triv AI.  Marland Kitchen Arthritis     back    Past Surgical History  Procedure Laterality Date  . Coronary artery bypass graft  2006    off-pump bypass surgery with LIMA to the LAD and SVG to the second diagonal artery ( performed by Dr Servando Snare)   . Tee without cardioversion N/A 02/28/2014    Procedure: TRANSESOPHAGEAL ECHOCARDIOGRAM (TEE);  Surgeon: Sanda Klein, MD;  Location: [facility];  Service: Cardiovascular;  Laterality: N/A;  . Cardioversion N/A 02/28/2014    Procedure: CARDIOVERSION;  Surgeon: Sanda Klein, MD;  Location: [facility];  Service: Cardiovascular;  Laterality: N/A;  . Cataract extraction Bilateral 2014    with lens implanted  . Abdominal hysterectomy  1975  . Rotator cuff repair Bilateral     r-1999, l- 2005  . Joint replacement Bilateral     L-2004, R-2006  . Total knee arthroplasty Right 07/02/2014    Procedure: Resection of Infected Right Total Knee Arthroplasty with placement antibiotic spacer;  Surgeon: Mauri Pole, MD;  Location: [facility];  Service: Orthopedics;  Laterality: Right;  . Left heart catheterization with coronary/graft angiogram N/A 09/17/2014    Procedure: LEFT HEART CATHETERIZATION WITH Beatrix Fetters;  Surgeon: Troy Sine, MD;  Location[facility] C[facility];  Service: Cardiovascular;  Laterality: N/A;  . Reimplantation of total knee Right 10/08/2014    Procedure: REIMPLANTATION OF RIGHT TOTAL KNEE ARTHROPLASTY WITH REMOVAL OF ANTIBIOTIC SPACER;  Surgeon: Paralee Cancel, MD;  Location: [facility];  Service: Orthopedics;   Laterality: Right;  . Colonoscopy N/A 02/19/2015    Procedure: COLONOSCOPY;  Surgeon: Ladene Artist, MD;  Location: [facility] ENDOSCOPY;  Service: Endoscopy;  Laterality: N/A;  . Cardiac catheterization N/A 05/09/2015    Procedure: Right Heart Cath;  Surgeon: Larey Dresser, MD;  Location: [place] [facility] CV LAB;  Service: Cardiovascular;  Laterality: N/A;     Current Outpatient Prescriptions  Medication Sig Dispense Refill  . acetaminophen (TYLENOL) 500 MG tablet Take 500-1,000 mg by mouth every 6 (six) hours as needed for moderate pain or headache.    . alendronate (FOSAMAX) 70 MG tablet Take 1 tablet (70 mg total) by mouth once a week. Take with a full glass of water on an empty stomach, remain upright (Patient taking differently: Take 70 mg by mouth once a week. Take with a full glass of water on an empty stomach, remain upright  Saturday) 12 tablet 3  . ALPRAZolam (XANAX) 0.5 MG tablet Take 1 tablet (0.5 mg total) by mouth 2 (two) times daily as needed for anxiety. 60 tablet 5  . aspirin EC 81 MG tablet Take 81 mg by mouth daily.    . Cholecalciferol (VITAMIN D-3) 1000 UNITS CAPS Take 2,000 Units by mouth 2 (two) times daily.    . diphenhydrAMINE (BENADRYL) 25 MG tablet Take 50 mg  by mouth 2 (two) times daily as needed for allergies (eye allergies).     . furosemide (LASIX) 20 MG tablet Take 1 tablet (20 mg total) by mouth daily as needed for fluid. 90 tablet 3  . hydrALAZINE (APRESOLINE) 25 MG tablet Take 0.5 tablets (12.5 mg total) by mouth every 8 (eight) hours. 45 tablet 11  . Iron-Vitamins (GERITOL COMPLETE PO) Take 1 tablet by mouth daily.    . nitroGLYCERIN (NITROSTAT) 0.4 MG SL tablet Place 1 tablet (0.4 mg total) under the tongue every 5 (five) minutes as needed for chest pain. 25 tablet 2  . Omega-3 Fatty Acids (FISH OIL) 1000 MG CAPS Take 1-2 capsules by mouth 2 (two) times daily. Takes one in the morning and two capsules at night.    Marland Kitchen omeprazole (PRILOSEC) 20 MG capsule Take 1 capsule  (20 mg total) by mouth daily. 90 capsule 3  . pindolol (VISKEN) 5 MG tablet Take 0.5 tablets (2.5 mg total) by mouth 2 (two) times daily. 60 tablet 11  . polyethylene glycol (MIRALAX / GLYCOLAX) packet Take 17 g by mouth 2 (two) times daily. (Patient taking differently: Take 17 g by mouth daily as needed for moderate constipation. ) 14 each 0  . Probiotic Product (PROBIOTIC PO) Take 1 capsule by mouth daily.    Marland Kitchen rOPINIRole (REQUIP) 0.25 MG tablet Take 0.25 mg by mouth 3 (three) times daily.    . simvastatin (ZOCOR) 40 MG tablet Take 0.5 tablets (20 mg total) by mouth at bedtime. 45 tablet 3  . traMADol (ULTRAM) 50 MG tablet TAKE 1 TABLET BY MOUTH TWICE A DAY (Patient taking differently: TAKE 1 TABLET BY MOUTH TWICE A DAY PRN PAIN) 60 tablet 5  . traZODone (DESYREL) 50 MG tablet Take 1 tablet (50 mg total) by mouth at bedtime. 90 tablet 3  . isosorbide mononitrate (IMDUR) 30 MG 24 hr tablet Take 1 tablet (30 mg total) by mouth daily. 90 tablet 3   No current facility-administered medications for this visit.    Allergies:   Atorvastatin; Crestor; Morphine and related; and Prednisone    Social History:  The patient  reports that she has never smoked. She has never used smokeless tobacco. She reports that she does not drink alcohol or use illicit drugs.   Family History:  The patient's family history includes Parkinson's disease in her mother.    ROS:  Please see the history of present illness.    Otherwise, review of systems positive for none.   All other systems are reviewed and negative.    PHYSICAL EXAM: VS:  BP 128/60 mmHg  Pulse 72  Ht 5\' 1"  (1.549 m)  Wt 132 lb (59.875 kg)  BMI 24.95 kg/m2 , BMI Body mass index is 24.95 kg/(m^2).  General: Alert, oriented x3, no distress Head: no evidence of trauma, PERRL, EOMI, no exophtalmos or lid lag, no myxedema, no xanthelasma; normal ears, nose and oropharynx Neck: normal jugular venous pulsations and no hepatojugular reflux; brisk  carotid pulses without delay and no carotid bruits Chest: clear to auscultation, no signs of consolidation by percussion or palpation, normal fremitus, symmetrical and full respiratory excursions Cardiovascular: normal position and quality of the apical impulse, regular rhythm, normal first and second heart sounds, no murmurs, rubs or gallops Abdomen: no tenderness or distention, no masses by palpation, no abnormal pulsatility or arterial bruits, normal bowel sounds, no hepatosplenomegaly Extremities: no clubbing, cyanosis or edema; 2+ radial, ulnar and brachial pulses bilaterally; 2+ right femoral, posterior tibial  and dorsalis pedis pulses; 2+ left femoral, posterior tibial and dorsalis pedis pulses; no subclavian or femoral bruits Neurological: grossly nonfocal Psych: euthymic mood, full affect   EKG:  EKG is not ordered today.   Recent Labs: 05/06/2015: ALT 146*; B Natriuretic Peptide 602.2* 05/07/2015: TSH 1.086 05/08/2015: Magnesium 2.1 05/10/2015: Hemoglobin 9.9*; Platelets 166 05/16/2015: BUN 30*; Creat 1.91*; Potassium 4.8; Sodium 140    Lipid Panel    Component Value Date/Time   CHOL 132 09/16/2014 0423   TRIG 148 09/16/2014 0423   HDL 33* 09/16/2014 0423   CHOLHDL 4.0 09/16/2014 0423   VLDL 30 09/16/2014 0423   LDLCALC 69 09/16/2014 0423      Wt Readings from Last 3 Encounters:  05/16/15 132 lb (59.875 kg)  05/14/15 130 lb 5.4 oz (59.122 kg)  05/02/15 137 lb 4.8 oz (62.279 kg)     ASSESSMENT AND PLAN:  1. Hypotension/bradycardia - the recent presentation was a little confusing, but I suspect that the problems were a combination of multiple changes in medications including initiation of steroid therapy, increasing doses of metoprolol and enalapril. Infection may also have played a role.  2. Tachycardia bradycardia syndrome with alternating paroxysmal atrial fibrillation with rapid ventricular response and sinus bradycardia. Currently we are in a stop gap situation,  treating with pindolol instead of metoprolol. It is likely that at some point in the future she might require dual-chamber permanent pacemaker therapy. In view of her previous diagnosis of coronary artery disease and the findings on right heart catheterization, it is likely that the only reasonable antiarrhythmics we could use her amiodarone and dofetilide. Dofetilide might be dangerous with her fluctuating renal function. Amiodarone would definitely lead to more problems with bradycardia and precipitate the need for a pacemaker.  3. Paroxysmal atrial fibrillation with history of bleeding during anticoagulation therapy. She has never had a stroke or TIA. She is also at increased risk of bleeding due to advanced age and renal dysfunction. Her embolic risk is unfortunately also high. She is currently on aspirin and has no desire to take any drugs that would have more potent anticoagulant effects. CHADSVasc 6 (age 25, HTN, gender, CAD, CHF) HAS-BLED  4. Acute on chronic diastolic heart failure - today she appears to be clinically euvolemic and is asymptomatic. We'll continue the current "as needed" prescription for her loop diuretic. She seems to have a fairly narrow range of volume compensation. Today she weighs 132 pounds on our office scale but weighs only 127 pounds at home. I have instructed her to take furosemide only on days when her weight exceeds 130 pounds on her home scale. The severity of her diastolic heart failure is surprising. It raises the concern for possible restrictive cardiomyopathy (such as cardiac amyloidosis) or even constrictive pericarditis (following her previous bypass procedure).  5. Severe pulmonary artery hypertension - clearly this is at least in part related to venous hypertension/left heart diastolic failure. However, the transpulmonary gradient was 22 mmHg suggesting there is an important component of intrinsic pulmonary arterial disease, the etiology of which is unclear. She  has never smoked, does not have signs or symptoms of obstructive sleep apnea and has never had pulmonary embolism. While diastolic heart failure has been well documented in previous echocardiograms, in 2012 her echo suggested minimally abnormal pulmonary artery systolic pressure at 32 mm Hg. is her possible connection between her pulmonary artery hypertension and the recently diagnosed inflammatory polyarthritis? If the answer is yes, she may benefit from direct pulmonary vasodilators such  as endothelin antagonists or PDE 5 inhibitors.  6. Recently diagnosed polymyalgia rheumatica/inflammatory polyarthritis - she had remarkably severe elevation in inflammatory markers and has responded well from this point of view to steroid therapy. Note negative ANA, but markedly positive rheumatoid factor and anti-CCP antibodies. I wonder whether she has true rheumatoid arthritis and whether this has anything to do with her pulmonary artery hypertension. I think should benefit from evaluation by a rheumatologist  7. Acute on chronic renal failure - there have been recent very broad fluctuations in her renal parameters and labs drawn today show a creatinine of 1.9, not quite as bad as the peak of her renal insufficiency during her hospitalization, but substantially higher than her baseline that appears to be 1.5. I have recommended that she discontinue Celebrex.    Current medicines are reviewed at length with the patient today.  The patient has numerous completely medications in her medicine bag and on her medicine list. The following changes have been made:  See below  Labs/ tests ordered today include:  Orders Placed This Encounter  Procedures  . Basic metabolic panel    Patient Instructions  Your physician has recommended you make the following change in your medication: STOP CELEBREX, ENALAPRIL AND METOPROLOL.  DECREASE HYDRLAZINE TO 1/2 TABLET THREE TIMES A DAY (EVERY 8 HOURS).  CHANGE ISOSORBIDE DN TO  ISOSORBIDE MN 30MG  DAILY.  Your physician recommends that you return for lab work in: [place].  Dr. [name] recommends that you schedule a follow-up appointment in: [place].         Mikael Spray, MD  05/17/2015 6:46 PM    Sanda Klein, MD, [facility] [phone] office [phone] pager

## 2015-05-17 ENCOUNTER — Encounter: Payer: Self-pay | Admitting: Cardiovascular Disease

## 2015-05-17 DIAGNOSIS — I2721 Secondary pulmonary arterial hypertension: Secondary | ICD-10-CM | POA: Insufficient documentation

## 2015-05-17 LAB — BASIC METABOLIC PANEL
BUN: 30 mg/dL — ABNORMAL HIGH (ref 7–25)
CHLORIDE: 106 mmol/L (ref 98–110)
CO2: 21 mmol/L (ref 20–31)
Calcium: 9.3 mg/dL (ref 8.6–10.4)
Creat: 1.91 mg/dL — ABNORMAL HIGH (ref 0.60–0.88)
Glucose, Bld: 87 mg/dL (ref 65–99)
POTASSIUM: 4.8 mmol/L (ref 3.5–5.3)
SODIUM: 140 mmol/L (ref 135–146)

## 2015-05-20 NOTE — Patient Outreach (Signed)
[facility]) Care Management  05/20/2015  Jacqueline [name] 1931-05-15 [id number]   Referral from Natividad Brood, RN to assign Community RN, assigned Jacqueline [name], Therapist, sports.  Thanks, Ronnell Freshwater. [place], [place] Assistant Phone: [phone] Fax: [phone]

## 2015-05-20 NOTE — Consult Note (Signed)
   [facility] Inpatient Consult   05/20/2015  RAEANA RESOP Oct 20, 1930 [id number] Patient called this writer today and states she read the information for [facility] and she desires services to start.  She states she thinks it would be a good idea for some monitoring while she is at home.  Information regarding request was sent to [place] Management department for assignment needs. Natividad Brood, RN BSN [facility] Liaison  [phone] business mobile phone

## 2015-05-21 ENCOUNTER — Other Ambulatory Visit: Payer: Self-pay | Admitting: *Deleted

## 2015-05-21 NOTE — Patient Outreach (Signed)
Attempt #1 made to f/u with pt on referral  from [place] who received a call from pt requesting [facility] services. HIPPA compliant voice message left with contact number.  If no response, will try again.      [name].   [place] [facility] Management  [phone]

## 2015-05-21 NOTE — Patient Outreach (Signed)
Received a return call from pt to HIPPA voice message left earlier by RN CM.  Spoke with pt, HIPPA verified. Pt reports  doing good since hospital discharge 10/18, while in the hospital- they took off 22 lbs of fluid.    Pt states f/u with Dr. [name]  10/20, to see him again 10/25.  Pt states weighing daily, today was 127 lbs, ranging 127-128 lbs.   Discussed with pt doing a home visit as pt called Hospital liaison requesting [facility] services.    As discussed, plan to f/u with pt on 10/26- home visit.     [name].   [place] [facility] Management  [phone]

## 2015-05-22 ENCOUNTER — Other Ambulatory Visit: Payer: Self-pay | Admitting: *Deleted

## 2015-05-23 ENCOUNTER — Encounter: Payer: Self-pay | Admitting: *Deleted

## 2015-05-23 ENCOUNTER — Encounter: Payer: Self-pay | Admitting: Cardiovascular Disease

## 2015-05-23 ENCOUNTER — Ambulatory Visit (INDEPENDENT_AMBULATORY_CARE_PROVIDER_SITE_OTHER): Payer: PPO | Admitting: Cardiovascular Disease

## 2015-05-23 VITALS — BP 148/74 | HR 73 | Resp 16 | Ht 61.0 in | Wt 133.0 lb

## 2015-05-23 DIAGNOSIS — I10 Essential (primary) hypertension: Secondary | ICD-10-CM

## 2015-05-23 DIAGNOSIS — I272 Other secondary pulmonary hypertension: Secondary | ICD-10-CM | POA: Diagnosis not present

## 2015-05-23 DIAGNOSIS — I2721 Secondary pulmonary arterial hypertension: Secondary | ICD-10-CM

## 2015-05-23 DIAGNOSIS — I495 Sick sinus syndrome: Secondary | ICD-10-CM

## 2015-05-23 DIAGNOSIS — I25118 Atherosclerotic heart disease of native coronary artery with other forms of angina pectoris: Secondary | ICD-10-CM

## 2015-05-23 DIAGNOSIS — I480 Paroxysmal atrial fibrillation: Secondary | ICD-10-CM

## 2015-05-23 DIAGNOSIS — I2510 Atherosclerotic heart disease of native coronary artery without angina pectoris: Secondary | ICD-10-CM

## 2015-05-23 NOTE — Patient Instructions (Signed)
Dr. Croitoru recommends that you schedule a follow-up appointment in: 3 MONTHS   

## 2015-05-23 NOTE — Progress Notes (Signed)
Patient ID: Jacqueline [name], female   DOB: 24-Sep-1930, 81 y.o.   MRN: [id number]     Cardiology Office Note   Date:  05/23/2015   ID:  Jacqueline [name], DOB 08-12-1930, MRN [id number]  PCP:  Zigmund Gottron, MD  Cardiologist:   Sanda Klein, MD   Chief Complaint  Patient presents with  . 1 WEEK FOLLOW UP      History of Present Illness: Jacqueline [name] is a 81 y.o. female who presents for  Follow-up after recent hospitalization bradycardia and shock. She has turned around remarkably well. She is living independently and is completely asymptomatic. She has been keeping a close eye on her weight which has been steady at just under 130 pounds. She has only taken 1 dose of diuretic since she was here last period she has very infrequent problems with dizziness. When she checked her blood pressure at home today was 118/66 mmHg.  He denies any angina or shortness of breath. Her joints are beginning to ache again (especially her shoulders).  She has a rheumatology follow-up appointment in the next several days.  Her echocardiogram today shows an ectopic atrial rhythm (low atrial rhythm) with negative P waves in leads 2 , 3, aVF, V5 and V6 and a positive monophasic P wave in lead V1.  Past medical history significant for CAD s/p coronary artery bypass surgery (LIMA to LAD and SVG to diagonal 2, Dr. Servando Snare; f/u cath 08/2014 atretic LIMA but good distal flow via the other conduits) and has normal left ventricular systolic function and a relatively recent normal nuclear perfusion study. She had highly symptomatic atrial fibrillation rapid ventricular response in August 2015 and underwent TEE-guided cardioversion followed by treatment with Eliquis. She does not have a history of stroke or TIA. ELIQUIS was stopped due to GI bleed in July. Has not been restarted at her request.  Presented with hypotension and bradycardia in October 2016 , likely due to changes in medication and a simultaneous urinary tract  infection. Echo showed normal left ventricular systolic function with pseudo- normal filling. Right heart catheterization showed PA pressure 77/28 , mean wedge pressure 27 , mean right atrial pressure 17 , normal cardiac output 5.8 L/minute.  Past Medical History  Diagnosis Date  . Hypertension   . CAD (coronary artery disease)     a. s/p CABG x 2 (LIMA->LAD, VG->Diag);  b. 08/2014 Cath: LM nl, LAD 90p, LCX nl, RCA nl/dominant, LIMA->LAD atretic, VG->D2 patent w/ retrograde filling of LAD, EF 55-60%-->Med Rx.  Marland Kitchen Hyperlipemia   . Insomnia   . Diverticulitis 02/21/2012  . GERD (gastroesophageal reflux disease)   . Atrial fibrillation with rapid ventricular response ([place]) 02/2014    a. CHA2DS2VASc = 6 -> on eliquis;  b. 02/2014 s/p DCCV;  c. 08/2014 Echo: EF 55-60%, Gr 2 DD, mild MR, triv AI.  Marland Kitchen Arthritis     back    Past Surgical History  Procedure Laterality Date  . Coronary artery bypass graft  2006    off-pump bypass surgery with LIMA to the LAD and SVG to the second diagonal artery ( performed by Dr Servando Snare)   . Tee without cardioversion N/A 02/28/2014    Procedure: TRANSESOPHAGEAL ECHOCARDIOGRAM (TEE);  Surgeon: Sanda Klein, MD;  Location: [place];  Service: Cardiovascular;  Laterality: N/A;  . Cardioversion N/A 02/28/2014    Procedure: CARDIOVERSION;  Surgeon: Sanda Klein, MD;  Location: [facility];  Service: Cardiovascular;  Laterality: N/A;  . Cataract extraction Bilateral 2014  with lens implanted  . Abdominal hysterectomy  1975  . Rotator cuff repair Bilateral     r-1999, l- 2005  . Joint replacement Bilateral     L-2004, R-2006  . Total knee arthroplasty Right 07/02/2014    Procedure: Resection of Infected Right Total Knee Arthroplasty with placement antibiotic spacer;  Surgeon: Mauri Pole, MD;  Location: [facility];  Service: Orthopedics;  Laterality: Right;  . Left heart catheterization with coronary/graft angiogram N/A 09/17/2014    Procedure: LEFT HEART  CATHETERIZATION WITH Beatrix Fetters;  Surgeon: Troy Sine, MD;  Location: [facility] CATH LAB;  Service: Cardiovascular;  Laterality: N/A;  . Reimplantation of total knee Right 10/08/2014    Procedure: REIMPLANTATION OF RIGHT TOTAL KNEE ARTHROPLASTY WITH REMOVAL OF ANTIBIOTIC SPACER;  Surgeon: Paralee Cancel, MD;  Location: [facility];  Service: Orthopedics;  Laterality: Right;  . Colonoscopy N/A 02/19/2015    Procedure: COLONOSCOPY;  Surgeon: Ladene Artist, MD;  Location: [facility] ENDOSCOPY;  Service: Endoscopy;  Laterality: N/A;  . Cardiac catheterization N/A 05/09/2015    Procedure: Right Heart Cath;  Surgeon: Larey Dresser, MD;  Location: [place] CV LAB;  Service: Cardiovascular;  Laterality: N/A;     Current Outpatient Prescriptions  Medication Sig Dispense Refill  . acetaminophen (TYLENOL) 500 MG tablet Take 500-1,000 mg by mouth every 6 (six) hours as needed for moderate pain or headache.    . alendronate (FOSAMAX) 70 MG tablet Take 1 tablet (70 mg total) by mouth once a week. Take with a full glass of water on an empty stomach, remain upright (Patient taking differently: Take 70 mg by mouth once a week. Take with a full glass of water on an empty stomach, remain upright  Saturday) 12 tablet 3  . ALPRAZolam (XANAX) 0.5 MG tablet Take 1 tablet (0.5 mg total) by mouth 2 (two) times daily as needed for anxiety. 60 tablet 5  . aspirin EC 81 MG tablet Take 81 mg by mouth daily.    . Cholecalciferol (VITAMIN D-3) 1000 UNITS CAPS Take 2,000 Units by mouth 2 (two) times daily.    . diphenhydrAMINE (BENADRYL) 25 MG tablet Take 50 mg by mouth 2 (two) times daily as needed for allergies (eye allergies).     . furosemide (LASIX) 20 MG tablet Take 1 tablet (20 mg total) by mouth daily as needed for fluid. 90 tablet 3  . hydrALAZINE (APRESOLINE) 25 MG tablet Take 0.5 tablets (12.5 mg total) by mouth every 8 (eight) hours. 45 tablet 11  . Iron-Vitamins (GERITOL COMPLETE PO) Take 1 tablet by mouth  daily.    . isosorbide mononitrate (IMDUR) 30 MG 24 hr tablet Take 1 tablet (30 mg total) by mouth daily. 90 tablet 3  . nitroGLYCERIN (NITROSTAT) 0.4 MG SL tablet Place 1 tablet (0.4 mg total) under the tongue every 5 (five) minutes as needed for chest pain. 25 tablet 2  . Omega-3 Fatty Acids (FISH OIL) 1000 MG CAPS Take 1-2 capsules by mouth 2 (two) times daily. Takes one in the morning and two capsules at night.    Marland Kitchen omeprazole (PRILOSEC) 20 MG capsule Take 1 capsule (20 mg total) by mouth daily. 90 capsule 3  . pindolol (VISKEN) 5 MG tablet Take 0.5 tablets (2.5 mg total) by mouth 2 (two) times daily. 60 tablet 11  . polyethylene glycol (MIRALAX / GLYCOLAX) packet Take 17 g by mouth 2 (two) times daily. (Patient taking differently: Take 17 g by mouth daily as needed for moderate constipation. )  14 each 0  . Probiotic Product (PROBIOTIC PO) Take 1 capsule by mouth daily.    Marland Kitchen rOPINIRole (REQUIP) 0.25 MG tablet Take 0.25 mg by mouth 3 (three) times daily.    . simvastatin (ZOCOR) 40 MG tablet Take 0.5 tablets (20 mg total) by mouth at bedtime. 45 tablet 3  . traMADol (ULTRAM) 50 MG tablet TAKE 1 TABLET BY MOUTH TWICE A DAY (Patient taking differently: TAKE 1 TABLET BY MOUTH TWICE A DAY PRN PAIN) 60 tablet 5  . traZODone (DESYREL) 50 MG tablet Take 1 tablet (50 mg total) by mouth at bedtime. 90 tablet 3   No current facility-administered medications for this visit.    Allergies:   Atorvastatin; Crestor; Morphine and related; and Prednisone    Social History:  The patient  reports that she has never smoked. She has never used smokeless tobacco. She reports that she does not drink alcohol or use illicit drugs.   Family History:  The patient's family history includes Parkinson's disease in her mother.    ROS:  Please see the history of present illness.    Otherwise, review of systems positive for none.   All other systems are reviewed and negative.    PHYSICAL EXAM: VS:  BP 148/74 mmHg   Pulse 73  Ht 5\' 1"  (1.549 m)  Wt 133 lb (60.328 kg)  BMI 25.14 kg/m2 , BMI Body mass index is 25.14 kg/(m^2).  General: Alert, oriented x3, no distress Head: no evidence of trauma, PERRL, EOMI, no exophtalmos or lid lag, no myxedema, no xanthelasma; normal ears, nose and oropharynx Neck: normal jugular venous pulsations and no hepatojugular reflux; brisk carotid pulses without delay and no carotid bruits Chest: clear to auscultation, no signs of consolidation by percussion or palpation, normal fremitus, symmetrical and full respiratory excursions Cardiovascular: normal position and quality of the apical impulse, regular rhythm, normal first and second heart sounds, no murmurs, rubs or gallops Abdomen: no tenderness or distention, no masses by palpation, no abnormal pulsatility or arterial bruits, normal bowel sounds, no hepatosplenomegaly Extremities: no clubbing, cyanosis or edema; 2+ radial, ulnar and brachial pulses bilaterally; 2+ right femoral, posterior tibial and dorsalis pedis pulses; 2+ left femoral, posterior tibial and dorsalis pedis pulses; no subclavian or femoral bruits Neurological: grossly nonfocal Psych: euthymic mood, full affect   EKG:  EKG is ordered today. The ekg ordered today demonstrates  Low atrial rhythm, otherwise normal   Recent Labs: 05/06/2015: ALT 146*; B Natriuretic Peptide 602.2* 05/07/2015: TSH 1.086 05/08/2015: Magnesium 2.1 05/10/2015: Hemoglobin 9.9*; Platelets 166 05/16/2015: BUN 30*; Creat 1.91*; Potassium 4.8; Sodium 140    Lipid Panel    Component Value Date/Time   CHOL 132 09/16/2014 0423   TRIG 148 09/16/2014 0423   HDL 33* 09/16/2014 0423   CHOLHDL 4.0 09/16/2014 0423   VLDL 30 09/16/2014 0423   LDLCALC 69 09/16/2014 0423      Wt Readings from Last 3 Encounters:  05/23/15 133 lb (60.328 kg)  05/22/15 127 lb (57.607 kg)  05/16/15 132 lb (59.875 kg)     .   ASSESSMENT AND PLAN:  1.  Low atrial rhythm -  Asymptomatic and  without bradycardia on low-dose pindolol. No intervention necessary  2. Tachycardia bradycardia syndrome with alternating paroxysmal atrial fibrillation with rapid ventricular response and sinus bradycardia. It is likely that at some point in the future she might require dual-chamber permanent pacemaker therapy. In view of her previous diagnosis of coronary artery disease and the findings on right  heart catheterization, it is likely that the only reasonable antiarrhythmics we could use her amiodarone and dofetilide. Dofetilide might be dangerous with her fluctuating renal function. Amiodarone would definitely lead to more problems with bradycardia and precipitate the need for a pacemaker.   For the time being, I think we can safely delay pacemaker implantation  3. Paroxysmal atrial fibrillation with history of bleeding during anticoagulation therapy. She has never had a stroke or TIA. She is also at increased risk of bleeding due to advanced age and renal dysfunction. Her embolic risk is unfortunately also high. She is currently on aspirin and has no desire to take any drugs that would have more potent anticoagulant effects. CHADSVasc 6 (age 68, HTN, gender, CAD, CHF) HAS-BLED 5 Odds of benefit (number needed to treat 12) and risk of bleeding (number needed to harm 67 ) are almost matched. Continue aspirin for now.  4. Acute on chronic diastolic heart failure - today she appears to be clinically euvolemic and is asymptomatic. We'll continue the current "as needed" prescription for her loop diuretic: to take furosemide only on days when her weight exceeds 130 pounds on her home scale. The severity of her diastolic heart failure is surprising. It raises the concern for possible restrictive cardiomyopathy (such as cardiac amyloidosis) or even constrictive pericarditis (following her previous bypass procedure).  The absence of low voltage leads against the diagnosis of amyloidosis. If she does have  constriction, she would not be a good candidate for surgery in my opinion.  if steroids are again recommended for treatment of her rheumatological condition, she may need to take the diuretic more frequently.  5. Severe pulmonary artery hypertension - clearly this is at least in part related to venous hypertension/left heart diastolic failure. However, the transpulmonary gradient was 22 mmHg suggesting there is an important component of intrinsic pulmonary arterial disease, the etiology of which is unclear. She has never smoked, does not have signs or symptoms of obstructive sleep apnea and has never had pulmonary embolism. While diastolic heart failure has been well documented in previous echocardiograms, in 2012 her echo suggested minimally abnormal pulmonary artery systolic pressure at 32 mm Hg. is her possible connection between her pulmonary artery hypertension and the recently diagnosed inflammatory polyarthritis? If the answer is yes, she may benefit from direct pulmonary vasodilators such as endothelin antagonists or PDE 5 inhibitors.  we'll reevaluate echo for pulmonary artery pressure at her next appointment.  6. Recently diagnosed polymyalgia rheumatica/inflammatory polyarthritis - she had remarkably severe elevation in inflammatory markers and has responded well from this point of view to steroid therapy. Note negative ANA, but markedly positive rheumatoid factor and anti-CCP antibodies. I wonder whether she has true rheumatoid arthritis and whether this has anything to do with her pulmonary artery hypertension.  7. Acute on chronic renal failure - there have been recent very broad fluctuations in her renal parameters; baseline appears to be 1.5. Avoid NSAIDs.    Current medicines are reviewed at length with the patient today.  The patient does not have concerns regarding medicines.  The following changes have been made:  no change  Labs/ tests ordered today include:  No orders of the  defined types were placed in this encounter.     Patient Instructions  Dr. [name] recommends that you schedule a follow-up appointment in: [place], Jacqueline Noy, MD  05/23/2015 5:03 PM    Sanda Klein, MD, [facility] [phone] office [phone] pager

## 2015-05-23 NOTE — Patient Outreach (Signed)
[facility]) Care Management   Jacqueline [name] 10/31/1930 [id number]  Jacqueline [name] is an 81 y.o. female   Home visit 05/22/15: Part of transition of care   Subjective:  Pt reports made an appointment to f/u with Dr. Andria Frames (post hospital visit) 11/9. Pt states she deals with pain in neck that goes down her spine, currently no pain.   Pt reports they found while she was in the hospital has  Rheumatoid Arthritis, to f/u with MD 11/7.  Pt states she is weighing daily, not had to use Lasix as not had a weight gain of 3 lbs in a day, 5 lbs in a week.  Pt states only sleeping 3-4 hours a night, MD said to take Tramadol, helps.   Objective:   Filed Vitals:   05/22/15 1612  BP: 128/74  Pulse: 66  Resp: 20   ROS  Physical Exam  Constitutional: She is oriented to person, place, and time. She appears well-developed and well-nourished.  Cardiovascular: Regular rhythm.   Respiratory: Breath sounds normal.  GI: Soft. Bowel sounds are normal.  Musculoskeletal: Normal range of motion.  Neurological: She is alert and oriented to person, place, and time.  Skin: Skin is warm and dry.  Psychiatric: She has a normal mood and affect. Her behavior is normal. Judgment and thought content normal.    Current Medications:  Reviewed medications with pt.  Current Outpatient Prescriptions  Medication Sig Dispense Refill  . acetaminophen (TYLENOL) 500 MG tablet Take 500-1,000 mg by mouth every 6 (six) hours as needed for moderate pain or headache.    . alendronate (FOSAMAX) 70 MG tablet Take 1 tablet (70 mg total) by mouth once a week. Take with a full glass of water on an empty stomach, remain upright (Patient taking differently: Take 70 mg by mouth once a week. Take with a full glass of water on an empty stomach, remain upright  Saturday) 12 tablet 3  . ALPRAZolam (XANAX) 0.5 MG tablet Take 1 tablet (0.5 mg total) by mouth 2 (two) times daily as needed for anxiety. 60 tablet 5  . aspirin  EC 81 MG tablet Take 81 mg by mouth daily.    . Cholecalciferol (VITAMIN D-3) 1000 UNITS CAPS Take 2,000 Units by mouth 2 (two) times daily.    . diphenhydrAMINE (BENADRYL) 25 MG tablet Take 50 mg by mouth 2 (two) times daily as needed for allergies (eye allergies).     . furosemide (LASIX) 20 MG tablet Take 1 tablet (20 mg total) by mouth daily as needed for fluid. 90 tablet 3  . hydrALAZINE (APRESOLINE) 25 MG tablet Take 0.5 tablets (12.5 mg total) by mouth every 8 (eight) hours. 45 tablet 11  . Iron-Vitamins (GERITOL COMPLETE PO) Take 1 tablet by mouth daily.    . isosorbide mononitrate (IMDUR) 30 MG 24 hr tablet Take 1 tablet (30 mg total) by mouth daily. 90 tablet 3  . Omega-3 Fatty Acids (FISH OIL) 1000 MG CAPS Take 1-2 capsules by mouth 2 (two) times daily. Takes one in the morning and two capsules at night.    Marland Kitchen omeprazole (PRILOSEC) 20 MG capsule Take 1 capsule (20 mg total) by mouth daily. 90 capsule 3  . pindolol (VISKEN) 5 MG tablet Take 0.5 tablets (2.5 mg total) by mouth 2 (two) times daily. 60 tablet 11  . polyethylene glycol (MIRALAX / GLYCOLAX) packet Take 17 g by mouth 2 (two) times daily. (Patient taking differently: Take 17 g by mouth  daily as needed for moderate constipation. ) 14 each 0  . Probiotic Product (PROBIOTIC PO) Take 1 capsule by mouth daily.    Marland Kitchen rOPINIRole (REQUIP) 0.25 MG tablet Take 0.25 mg by mouth 3 (three) times daily.    . simvastatin (ZOCOR) 40 MG tablet Take 0.5 tablets (20 mg total) by mouth at bedtime. 45 tablet 3  . traMADol (ULTRAM) 50 MG tablet TAKE 1 TABLET BY MOUTH TWICE A DAY (Patient taking differently: TAKE 1 TABLET BY MOUTH TWICE A DAY PRN PAIN) 60 tablet 5  . traZODone (DESYREL) 50 MG tablet Take 1 tablet (50 mg total) by mouth at bedtime. 90 tablet 3  . nitroGLYCERIN (NITROSTAT) 0.4 MG SL tablet Place 1 tablet (0.4 mg total) under the tongue every 5 (five) minutes as needed for chest pain. (Patient not taking: Reported on 05/22/2015) 25 tablet 2    No current facility-administered medications for this visit.    Functional Status:   In your present state of health, do you have any difficulty performing the following activities: 05/22/2015 05/07/2015  Hearing? N N  Vision? - N  Difficulty concentrating or making decisions? N N  Walking or climbing stairs? N N  Dressing or bathing? N N  Doing errands, shopping? N N  Preparing Food and eating ? N -  Using the Toilet? N -  In the past six months, have you accidently leaked urine? N -  Do you have problems with loss of bowel control? N -  Managing your Medications? N -  Managing your Finances? N -  Housekeeping or managing your Housekeeping? Y -    Fall/Depression Screening:    PHQ 2/9 Scores 05/22/2015 05/02/2015 05/01/2015 03/27/2015 03/18/2015 03/01/2015 11/14/2014  PHQ - 2 Score 0 0 0 0 0 0 0    Assessment:   HF- weight today 127 lbs. Ranging 127-128 lbs. No edema, c/o sob, chest pain.  Pt weighing daily, but not recording.  Provided pt with [facility] calendar to start recording.   Also, provided pt with information on HF zones, reviewed.                            Impaired sleep- pt reports only sleeps 3-4 hours, does not nap, takes Tramadol- helps.          Plan:  Pt to f/u with Dr. [name] 10/27.            Pt to f/u with Dr. Andria Frames 11/9.             Pt to f/u with Rheumatoid Arthritis MD 11/7.            Pt to continue to weigh daily,record in [facility] calendar, take to MD appointments, call MD for weight gain of 3 lbs in a day,                   5 lbs in a week.           RN CM to f/u with pt telephonically 05/29/15 as part of ongoing transition of care.           Plan to inform Dr. Andria Frames of [facility] involvement- sent encounter notes as well as letter.     THN CM Care Plan Problem One        Most Recent Value   Care Plan Problem One  Risk for readmission due to recent hospitalization for HF    Role Documenting the  Problem One  Care Management Coordinator   Care Plan for Problem One   Active   THN Long Term Goal (31-90 days)  Pt would not readmit within 31 days of day of discharge.    THN Long Term Goal Start Date  05/21/15   Interventions for Problem One Long Term Goal  Discussed with pt ongoing compliance with weighing daily, f/u with PCP post discharge.    THN CM Short Term Goal #1 (0-30 days)  Pt would schedule f/u appointment with PCP within next 7 days    THN CM Short Term Goal #1 Start Date  05/21/15   [facility] CM Short Term Goal #1 Met Date  -- [goal met- to f/u with PCP 11/9]   Interventions for Short Term Goal #1  Discussed with pt calling PCP office to schedule f/u appointment- said would do today    THN CM Short Term Goal #2 (0-30 days)  Pt would not have a weight gain of 3 lbs in a day, 5 lbs in a week within the next 30 days    THN CM Short Term Goal #2 Start Date  05/21/15   Interventions for Short Term Goal #2  Discussed with pt use of Lasix (take as needed for fluid), ongoing compliance with low Na+ diet, weighing daily     Amari Zagal M.   [place] Care Management  [phone]

## 2015-05-29 ENCOUNTER — Other Ambulatory Visit: Payer: Self-pay | Admitting: *Deleted

## 2015-05-29 NOTE — Patient Outreach (Signed)
Transition of care (week 2, discharged 10/18, referral received 10/24):  Pt reports doing good, weights been between 127- 130 lbs.  Pt states 2 days ago, gained 3 lbs in one day (127-130 lbs), took fluid pill but weight did not go down, still 130 lbs.  Pt reports appetite is better, did not eat good in the hospital.  Pt reports no sob, chest pain, swelling, watching her salt.   Pt reports pain in neck/back- arthritis.  Pt states to f/u with MD (arthritis) 11/7 and Dr. Andria Frames 11/9.     As discussed with pt, plan to f/u again telephonically 11/9 as part of ongoing transition of care.     [name].   [place] [facility] Management  [phone]

## 2015-06-05 ENCOUNTER — Ambulatory Visit (INDEPENDENT_AMBULATORY_CARE_PROVIDER_SITE_OTHER): Payer: PPO | Admitting: Family Medicine

## 2015-06-05 ENCOUNTER — Ambulatory Visit: Payer: PPO | Admitting: *Deleted

## 2015-06-05 ENCOUNTER — Other Ambulatory Visit: Payer: Self-pay | Admitting: *Deleted

## 2015-06-05 ENCOUNTER — Encounter: Payer: Self-pay | Admitting: Family Medicine

## 2015-06-05 VITALS — BP 132/62 | HR 75 | Temp 98.5°F | Ht 62.0 in | Wt 134.6 lb

## 2015-06-05 DIAGNOSIS — M2550 Pain in unspecified joint: Secondary | ICD-10-CM | POA: Diagnosis not present

## 2015-06-05 DIAGNOSIS — I5032 Chronic diastolic (congestive) heart failure: Secondary | ICD-10-CM | POA: Diagnosis not present

## 2015-06-05 DIAGNOSIS — N182 Chronic kidney disease, stage 2 (mild): Secondary | ICD-10-CM

## 2015-06-05 LAB — BASIC METABOLIC PANEL
BUN: 23 mg/dL (ref 7–25)
CALCIUM: 9.3 mg/dL (ref 8.6–10.4)
CO2: 28 mmol/L (ref 20–31)
Chloride: 106 mmol/L (ref 98–110)
Creat: 1.28 mg/dL — ABNORMAL HIGH (ref 0.60–0.88)
Glucose, Bld: 89 mg/dL (ref 65–99)
POTASSIUM: 4.1 mmol/L (ref 3.5–5.3)
SODIUM: 140 mmol/L (ref 135–146)

## 2015-06-05 LAB — POCT SEDIMENTATION RATE: POCT SED RATE: 34 mm/h — AB (ref 0–22)

## 2015-06-05 NOTE — Patient Outreach (Signed)
Attempt made to contact pt (scheduled phone call) as part of ongoing transition of care (week 3).  HIPPA compliant voice message left with contact number.      [name].   [place] Care Management  [phone]

## 2015-06-05 NOTE — Patient Instructions (Signed)
I am glad you are doing well.   No changes - if it ain't broke don't fix it. I will call with the blood test results. See me in three months if things are going well.  Sooner if problems.

## 2015-06-05 NOTE — Patient Outreach (Signed)
Transition of care call:  Received a return phone call from pt to voice message left earlier by RN CM (week 3- transition of care).   Pt reports f/u with Dr. Andria Frames today, MD very pleased.  Pt states weight today is 130 lbs, ranges 127-132 lbs. , BP doing good.  Pt states she take a fluid pill 11/7 for a weight gain of 3 lbs in one day, weight came down.  Pt reports no swelling, sob, chest pain.  Pt reports did f/u with arthritis MD, was told no flare up now.  Pt reports currently no pain.  Pt states she is going back to work 11/14.  As discussed with pt, plan to f/u again telephonically 11/16 (ongoing transition of care).     Jacqueline [name].   [place] [facility] Management  [phone]

## 2015-06-05 NOTE — Assessment & Plan Note (Signed)
Recheck creat. 

## 2015-06-05 NOTE — Assessment & Plan Note (Signed)
Stable on current meds.  Has good plan for home monitoring and adjusting lasix dose.

## 2015-06-05 NOTE — Progress Notes (Signed)
   Subjective:    Patient ID: Jacqueline [name], female    DOB: 1931-03-18, 81 y.o.   MRN: [id number]  HPI Jacqueline [name] now feels great.  She went through a real tough patch with polyarthralgias, high sed rate, steroid intolerance and a hospitalization for CHF.  Since DC she has been steadily improving.  She saw cards and they were happy.  She saw rheumatology who concluded she had something real, but it is acting transient.  Unclear risk of recurrence. She also had CKD.  Unclear what to expect from her baseline creat.  It may be as low as 1.1 but I suspect 1.5 is more likely.  Last creat was 1.9 She is doing daily wts at home.  She doses her lasix based on that weight.  Recently, she has been at goal without lasix.     Review of Systems     Objective:   Physical Exam Lungs clear Cardiac RRR without m or g Ext no edema General, looks great and she is smiling.       Assessment & Plan:

## 2015-06-05 NOTE — Assessment & Plan Note (Signed)
Unclear dx.  In rheum family.  Transient with symptoms resolved and sed rate returned to normal.  Risk of recurrence?

## 2015-06-12 ENCOUNTER — Ambulatory Visit: Payer: Self-pay | Admitting: *Deleted

## 2015-06-12 ENCOUNTER — Other Ambulatory Visit: Payer: Self-pay | Admitting: *Deleted

## 2015-06-12 NOTE — Patient Outreach (Signed)
Transition of care call (week 4):  Attempt made to contact pt as part of ongoing transition of care.  HIPPA compliant voice message left with contact number.  If no response, will try again.    [name].   [place] [facility]  [phone]

## 2015-06-12 NOTE — Patient Outreach (Signed)
Transition of care call (week 4):   Returned phone call to pt who left a voice message- returning RN CM's call made earlier today.  Pt reports doing good, weight today 130 lbs., BP 130/68, Heart rate 77.  Pt reports her weight yesterday was 133 lbs, took a fluid pill, came down- 130 lbs. (with exception of yesterday, been staying 130 lbs.).  Pt states she went back to work 11/14.    As discussed, plan to do final transition of care call 11/23.   Jacqueline [name].   [place] [facility]  [phone]

## 2015-06-13 ENCOUNTER — Other Ambulatory Visit: Payer: Self-pay | Admitting: *Deleted

## 2015-06-13 MED ORDER — SIMVASTATIN 40 MG PO TABS: 20.0000 mg | ORAL_TABLET | Freq: Every day | ORAL | Status: DC

## 2015-06-14 ENCOUNTER — Other Ambulatory Visit: Payer: Self-pay | Admitting: *Deleted

## 2015-06-14 MED ORDER — HYDRALAZINE HCL 25 MG PO TABS: 12.5000 mg | ORAL_TABLET | Freq: Three times a day (TID) | ORAL | Status: DC

## 2015-06-17 ENCOUNTER — Other Ambulatory Visit: Payer: Self-pay | Admitting: Family Medicine

## 2015-06-17 ENCOUNTER — Other Ambulatory Visit: Payer: Self-pay

## 2015-06-17 MED ORDER — PINDOLOL 5 MG PO TABS: 2.5000 mg | ORAL_TABLET | Freq: Two times a day (BID) | ORAL | Status: DC

## 2015-06-17 NOTE — Telephone Encounter (Signed)
Request sent to refill Pindolol 5 mg for a 90 day supply.

## 2015-06-19 ENCOUNTER — Encounter: Payer: Self-pay | Admitting: *Deleted

## 2015-06-19 ENCOUNTER — Other Ambulatory Visit: Payer: Self-pay | Admitting: *Deleted

## 2015-06-19 NOTE — Patient Outreach (Signed)
Final transition of care call: Spoke with pt who reports doing good. Pt states went to [facility] fair 11/21 (comprehensive wellness exam), good report. Pt states weight is 132 lbs today and yesterday, staying there.  Pt reports no sob, chest pain, swelling, back to work.   Discussed with pt plan to close case, no further case management needs.    Plan to inform Dr. Andria Frames of case closure, send case closure let by in basket in Epic.  Plan to inform Lattie Haw ([place] management assistant) to close case.     [name].   [place] [facility]  [phone]

## 2015-06-24 ENCOUNTER — Other Ambulatory Visit: Payer: Self-pay | Admitting: *Deleted

## 2015-07-03 ENCOUNTER — Other Ambulatory Visit: Payer: Self-pay | Admitting: Family Medicine

## 2015-07-31 IMAGING — CR DG CHEST 2V
2 series · 2 of 2 positions shown · non-contrast
Comparison: Thoracic spine series dated October 03, 2008.

CLINICAL DATA: One-month history of cough.

EXAM:
CHEST  2 VIEW

[w chest pa]
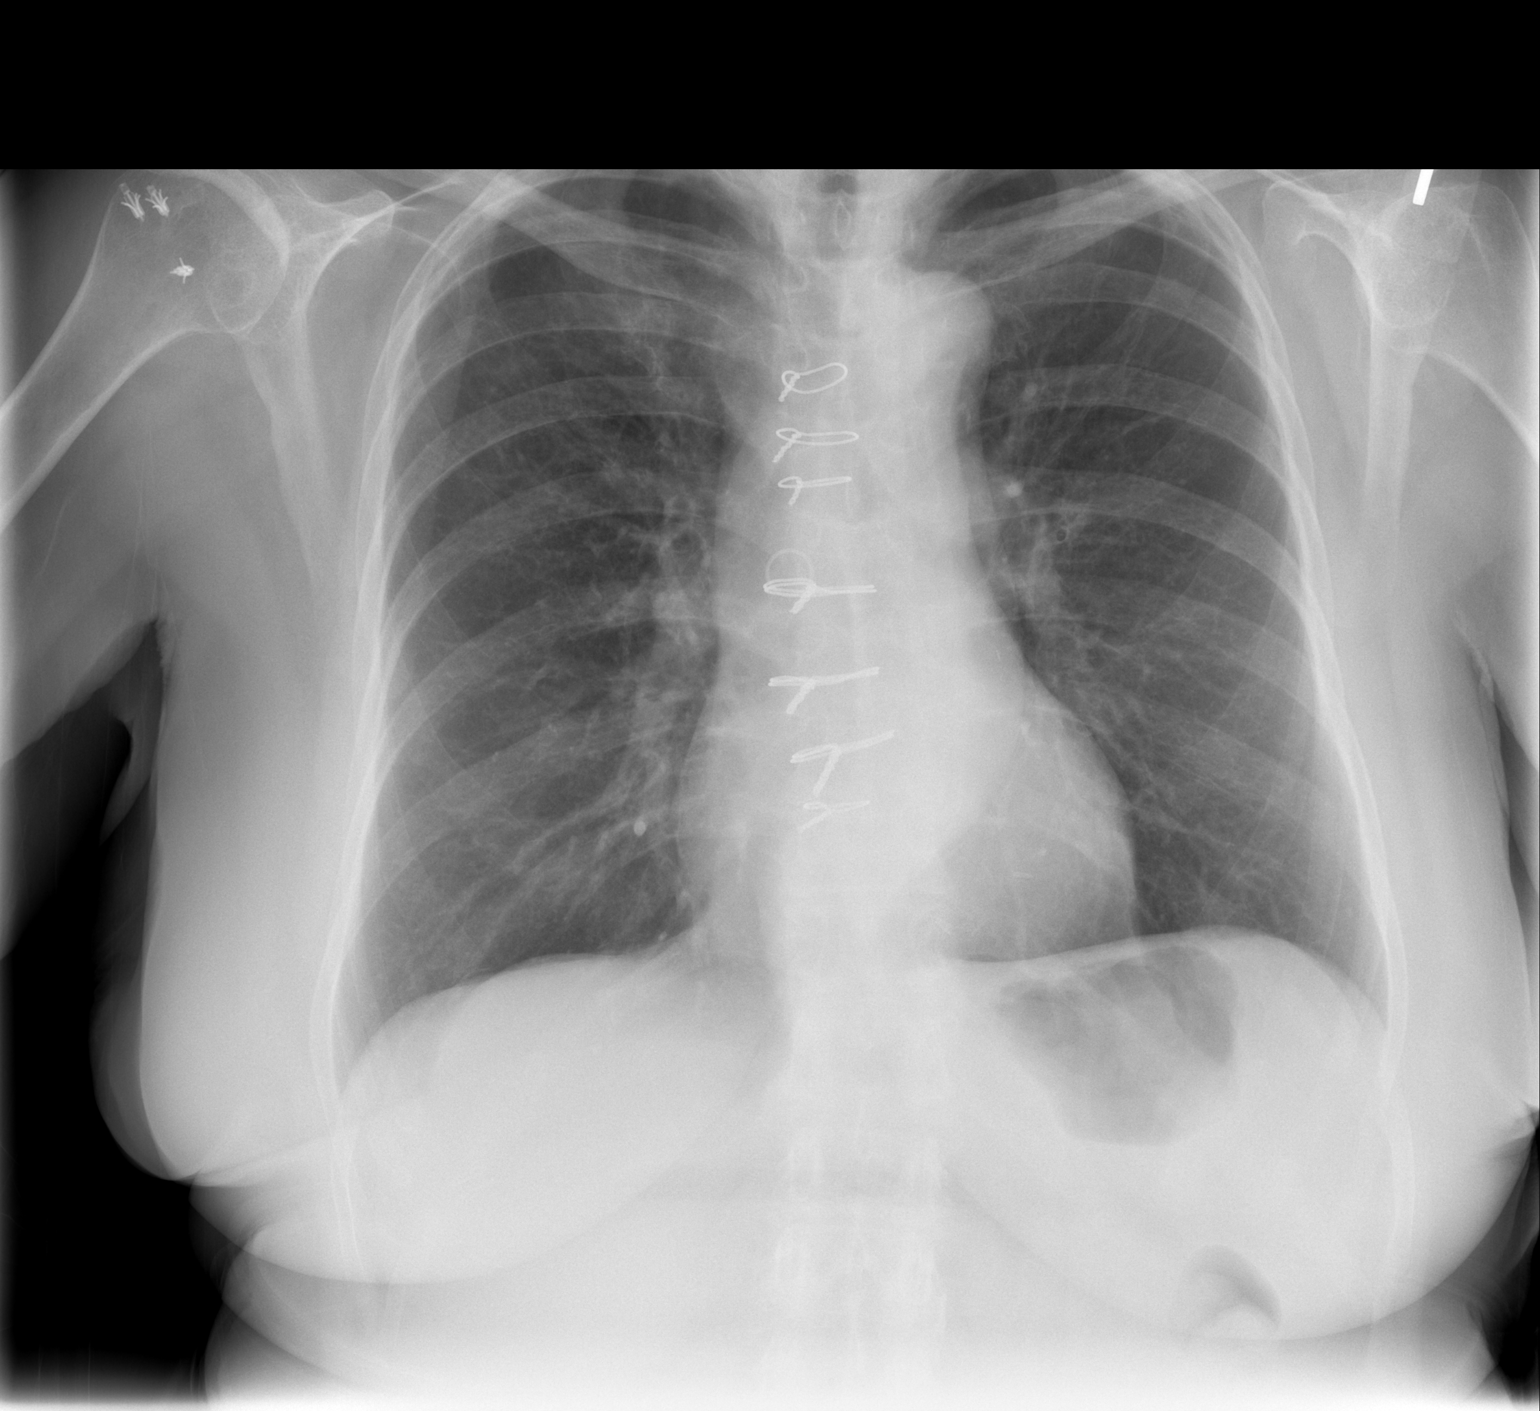

[w chest lat]
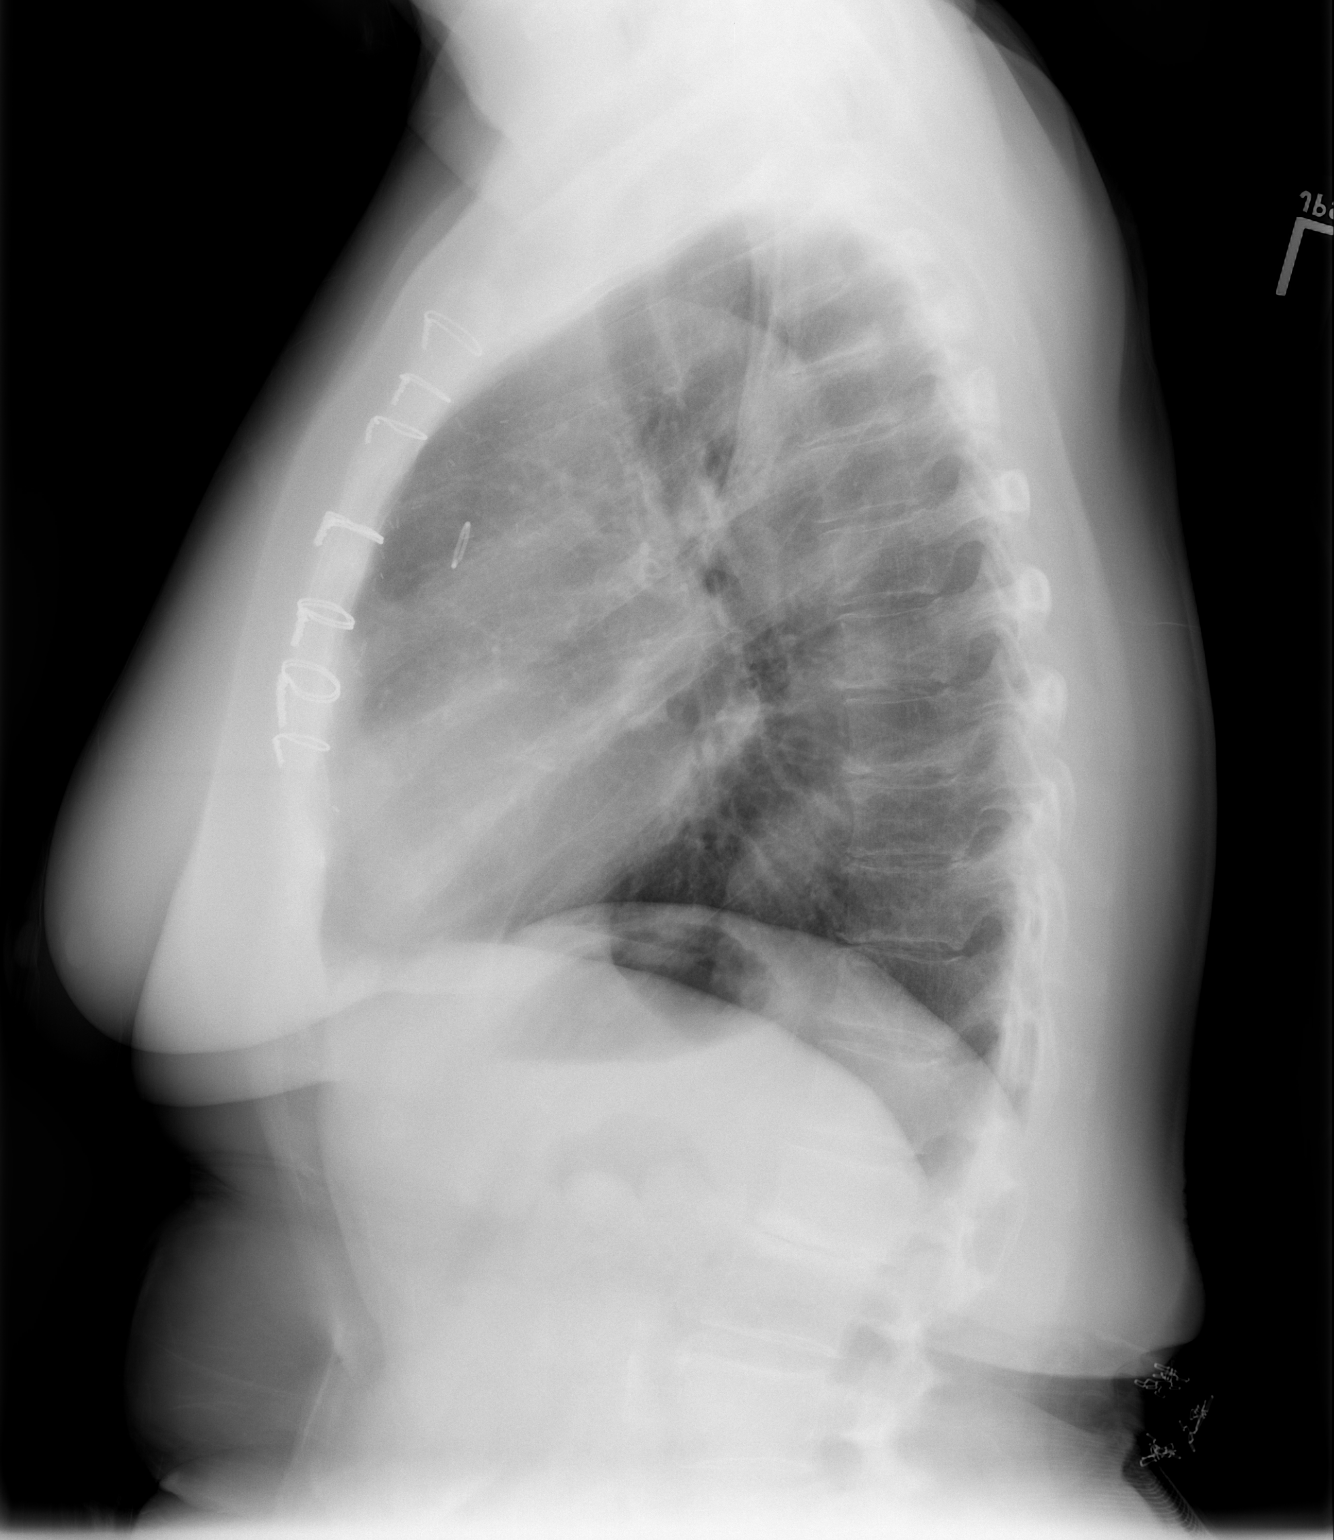

[2 of 2 positions shown; findings below may reference images not displayed]

FINDINGS: The lungs are mildly hyperinflated. There is no focal infiltrate.
The cardiac silhouette is normal in size. The patient has undergone
previous CABG. There is tortuosity of the descending thoracic aorta.
There is no pulmonary vascular congestion. There is no pleural
effusion or pneumothorax. The observed portions of the bony thorax
exhibit no acute abnormalities.
IMPRESSION: There is mild hyperinflation which may reflect COPD or reactive
airway disease. There is no evidence of pneumonia nor CHF.

## 2015-08-05 ENCOUNTER — Other Ambulatory Visit: Payer: Self-pay | Admitting: Family Medicine

## 2015-08-14 ENCOUNTER — Emergency Department (HOSPITAL_COMMUNITY)
Admission: EM | Admit: 2015-08-14 | Discharge: 2015-08-14 | Disposition: A | Discharge status: Home / Self Care | Payer: PPO | Attending: Emergency Medicine | Admitting: Emergency Medicine

## 2015-08-14 ENCOUNTER — Telehealth: Payer: Self-pay | Admitting: Cardiovascular Disease

## 2015-08-14 ENCOUNTER — Emergency Department (HOSPITAL_COMMUNITY): Payer: PPO

## 2015-08-14 ENCOUNTER — Encounter (HOSPITAL_COMMUNITY): Payer: Self-pay | Admitting: Emergency Medicine

## 2015-08-14 DIAGNOSIS — R0789 Other chest pain: Secondary | ICD-10-CM

## 2015-08-14 DIAGNOSIS — Z9889 Other specified postprocedural states: Secondary | ICD-10-CM | POA: Diagnosis not present

## 2015-08-14 DIAGNOSIS — I5031 Acute diastolic (congestive) heart failure: Secondary | ICD-10-CM | POA: Insufficient documentation

## 2015-08-14 DIAGNOSIS — Z951 Presence of aortocoronary bypass graft: Secondary | ICD-10-CM | POA: Diagnosis not present

## 2015-08-14 DIAGNOSIS — I110 Hypertensive heart disease with heart failure: Secondary | ICD-10-CM | POA: Diagnosis not present

## 2015-08-14 DIAGNOSIS — I2510 Atherosclerotic heart disease of native coronary artery without angina pectoris: Secondary | ICD-10-CM | POA: Diagnosis not present

## 2015-08-14 DIAGNOSIS — K219 Gastro-esophageal reflux disease without esophagitis: Secondary | ICD-10-CM | POA: Insufficient documentation

## 2015-08-14 DIAGNOSIS — Z79899 Other long term (current) drug therapy: Secondary | ICD-10-CM | POA: Diagnosis not present

## 2015-08-14 DIAGNOSIS — N189 Chronic kidney disease, unspecified: Secondary | ICD-10-CM | POA: Diagnosis not present

## 2015-08-14 DIAGNOSIS — Z7982 Long term (current) use of aspirin: Secondary | ICD-10-CM | POA: Insufficient documentation

## 2015-08-14 DIAGNOSIS — I129 Hypertensive chronic kidney disease with stage 1 through stage 4 chronic kidney disease, or unspecified chronic kidney disease: Secondary | ICD-10-CM | POA: Insufficient documentation

## 2015-08-14 DIAGNOSIS — R079 Chest pain, unspecified: Secondary | ICD-10-CM | POA: Diagnosis not present

## 2015-08-14 DIAGNOSIS — M1990 Unspecified osteoarthritis, unspecified site: Secondary | ICD-10-CM | POA: Insufficient documentation

## 2015-08-14 DIAGNOSIS — G4700 Insomnia, unspecified: Secondary | ICD-10-CM | POA: Diagnosis not present

## 2015-08-14 DIAGNOSIS — E785 Hyperlipidemia, unspecified: Secondary | ICD-10-CM | POA: Insufficient documentation

## 2015-08-14 DIAGNOSIS — R072 Precordial pain: Secondary | ICD-10-CM | POA: Diagnosis not present

## 2015-08-14 DIAGNOSIS — R0602 Shortness of breath: Secondary | ICD-10-CM | POA: Diagnosis not present

## 2015-08-14 LAB — TROPONIN I

## 2015-08-14 LAB — BASIC METABOLIC PANEL
Anion gap: 6 (ref 5–15)
BUN: 21 mg/dL — AB (ref 6–20)
CHLORIDE: 106 mmol/L (ref 101–111)
CO2: 26 mmol/L (ref 22–32)
CREATININE: 1.45 mg/dL — AB (ref 0.44–1.00)
Calcium: 9.5 mg/dL (ref 8.9–10.3)
GFR calc Af Amer: 38 mL/min — ABNORMAL LOW (ref >60)
GFR calc non Af Amer: 33 mL/min — ABNORMAL LOW (ref >60)
Glucose, Bld: 103 mg/dL — ABNORMAL HIGH (ref 65–99)
POTASSIUM: 4.3 mmol/L (ref 3.5–5.1)
SODIUM: 138 mmol/L (ref 135–145)

## 2015-08-14 LAB — MAGNESIUM: MAGNESIUM: 2.1 mg/dL (ref 1.7–2.4)

## 2015-08-14 LAB — BRAIN NATRIURETIC PEPTIDE: B NATRIURETIC PEPTIDE 5: 278 pg/mL — AB (ref 0.0–100.0)

## 2015-08-14 LAB — PHOSPHORUS: PHOSPHORUS: 4 mg/dL (ref 2.5–4.6)

## 2015-08-14 LAB — HEPATIC FUNCTION PANEL
ALBUMIN: 3.7 g/dL (ref 3.5–5.0)
ALT: 20 U/L (ref 14–54)
AST: 28 U/L (ref 15–41)
Alkaline Phosphatase: 53 U/L (ref 38–126)
BILIRUBIN DIRECT: 0.1 mg/dL (ref 0.1–0.5)
Indirect Bilirubin: 0.2 mg/dL — ABNORMAL LOW (ref 0.3–0.9)
Total Bilirubin: 0.3 mg/dL (ref 0.3–1.2)
Total Protein: 6.6 g/dL (ref 6.5–8.1)

## 2015-08-14 LAB — CBC
HEMATOCRIT: 33.5 % — AB (ref 36.0–46.0)
Hemoglobin: 11 g/dL — ABNORMAL LOW (ref 12.0–15.0)
MCH: 30.1 pg (ref 26.0–34.0)
MCHC: 32.8 g/dL (ref 30.0–36.0)
MCV: 91.8 fL (ref 78.0–100.0)
PLATELETS: 174 10*3/uL (ref 150–400)
RBC: 3.65 MIL/uL — AB (ref 3.87–5.11)
RDW: 13.5 % (ref 11.5–15.5)
WBC: 4.2 10*3/uL (ref 4.0–10.5)

## 2015-08-14 LAB — I-STAT TROPONIN, ED: Troponin i, poc: 0.01 ng/mL (ref 0.00–0.08)

## 2015-08-14 MED ORDER — NITROGLYCERIN 0.4 MG SL SUBL: 0.4000 mg | SUBLINGUAL_TABLET | SUBLINGUAL | Status: DC | PRN

## 2015-08-14 MED ORDER — ASPIRIN 81 MG PO CHEW
324.0000 mg | CHEWABLE_TABLET | Freq: Once | ORAL | Status: AC
Start: 2015-08-14 — End: 2015-08-14
  Administered 2015-08-14: 162 mg via ORAL
  Filled 2015-08-14: qty 4

## 2015-08-14 NOTE — ED Notes (Addendum)
Pt states she woke up at 0700 this am with a tightness in the center of her chest and shortness of breath. Pt states she was here recently and had to have fluid drawn off her chest. Pt took 2 81mg  ASA and 3 SL nitro tablets. Pt states nitro brought chest pain from 8/10 to 5/10.

## 2015-08-14 NOTE — Telephone Encounter (Signed)
Yes, I see, Dr. Debara Pickett saw her. Thanks

## 2015-08-14 NOTE — Telephone Encounter (Signed)
Woke up at 0700 this morning with tightness in chest across shoulders BP 152/82 now 104/70 Still has tightness in chest and tightness across shoulders Weight this morning 130-131 lbs (baseline) Is urinating since taking Lasix 40 mg this morning Feels a little short of breath No pain with deep breath Difficult to take a deep breath No nausea Advised patient to go to the ED by 9-1-1 since she still has tightness across her chest and shoulders without improvement after NTG, ASA and Lasix and feels a little shortness of breath for evaluation

## 2015-08-14 NOTE — ED Notes (Signed)
Pt is in stable condition upon d/c and is escorted from ED via wheelchair. 

## 2015-08-14 NOTE — ED Notes (Signed)
Patient transported to X-ray 

## 2015-08-14 NOTE — Telephone Encounter (Signed)
Agree, Did she go?

## 2015-08-14 NOTE — Telephone Encounter (Signed)
Yes she went to the ED

## 2015-08-14 NOTE — Consult Note (Signed)
CARDIOLOGY CONSULT NOTE   Patient ID: Jacqueline [name] MRN: [id number] DOB/AGE: 09/23/1930 82 y.o.  Admit date: 08/14/2015  Primary Physician   Zigmund Gottron, MD Primary Cardiologist   Dr. [name] Reason for Consultation   Chest tightness and SOB Requesting Physician Dr. Billy Fischer  HPI: Jacqueline [name] is a 82 y.o. female with a history of CAD, HTN, PAF, HL, insomnia, tachy-brady syndrome, CKD stage II-III, and chronic diastolic CHF who came to [facility] ED 08/14/2015 for evaluation of acute sob and chest tightness.   Past medical history significant for CAD s/p coronary artery bypass surgery (LIMA to LAD and SVG to diagonal 2, Dr. Servando Snare; f/u cath 08/2014 atretic LIMA but good distal flow via the other conduits).  She had highly symptomatic atrial fibrillation rapid ventricular response in August 2015 and underwent TEE-guided cardioversion followed by treatment with Eliquis.  ELIQUIS was stopped due to GI bleed in July. Has not been restarted at her request.   Admitted with hypotension, shock  and bradycardia  (tachy-brady) in October 2016, likely due to changes in medication and a simultaneous urinary tract infection. Echo showed normal left ventricular systolic function with pseudo- normal filling.  She underwent right heart catheterization on October 13 which showed severe elevation in right and left heart filling pressures. Net fluids: -3.5L for the admission.  Right atrial mean pressure 17 mmHg  Right ventricular pressure 77/21 mmHg  Coronary artery pressure 77/28 mm meters Hg (mean 49 mmHg)  Pulmonary artery wedge pressure mean 27 mmHg  Cardiac output 5.86 L/minute (cardiac index 3.55 L/minute/meter squared)  By echocardiography 04/2015 she continues to have normal left ventricular systolic function with an ejection fraction of 60-65 percent with evidence of elevated left ventricular/left atrial filling pressures (pseudo-normal mitral inflow) and had a remarkably accurate  evaluation of her systolic PA pressure at around 85 mm Hg. Moderate mitral insufficiency was described.  She was doing well on cardiac stand point when seen last by Dr. [name] 05/23/15. The patient woke up this morning around 7 AM with upper mid chest tightness and shortness of breath. He also had a separate neck pain. No radiation of pain. Denies nausea or vomiting. She took aspirin 81 mg x 2, sublingual nitroglycerin x 3 and Lasix 40 mg. This helped to ease off chest tightness and shortness of breath, never resolved. At peak her pain was 8 out of 10 currently 4/10. She thinks,  this feeling is similar to her prior cardiac problem when she was admitted Oct 2016. The patient denies nausea, vomiting, fever, palpitations,  orthopnea, PND, dizziness, syncope, cough, congestion, abdominal pain, hematochezia, melena, lower extremity edema.  Point-of-care troponin negative. Creatinine 1.45, seems at baseline. Chest x-ray clear. EKG showed NSR with new TWI in aVL.    Past Medical History  Diagnosis Date  . Hypertension   . CAD (coronary artery disease)     a. s/p CABG x 2 (LIMA->LAD, VG->Diag);  b. 08/2014 Cath: LM nl, LAD 90p, LCX nl, RCA nl/dominant, LIMA->LAD atretic, VG->D2 patent w/ retrograde filling of LAD, EF 55-60%-->Med Rx.  Marland Kitchen Hyperlipemia   . Insomnia   . Diverticulitis 02/21/2012  . GERD (gastroesophageal reflux disease)   . Atrial fibrillation with rapid ventricular response ([place]) 02/2014    a. CHA2DS2VASc = 6 -> on eliquis;  b. 02/2014 s/p DCCV;  c. 08/2014 Echo: EF 55-60%, Gr 2 DD, mild MR, triv AI.  Marland Kitchen Arthritis     back     Past Surgical History  Procedure Laterality  Date  . Coronary artery bypass graft  2006    off-pump bypass surgery with LIMA to the LAD and SVG to the second diagonal artery ( performed by Dr Servando Snare)   . Tee without cardioversion N/A 02/28/2014    Procedure: TRANSESOPHAGEAL ECHOCARDIOGRAM (TEE);  Surgeon: Sanda Klein, MD;  Location: [facility];  Service:  Cardiovascular;  Laterality: N/A;  . Cardioversion N/A 02/28/2014    Procedure: CARDIOVERSION;  Surgeon: Sanda Klein, MD;  Location: [facility];  Service: Cardiovascular;  Laterality: N/A;  . Cataract extraction Bilateral 2014    with lens implanted  . Abdominal hysterectomy  1975  . Rotator cuff repair Bilateral     r-1999, l- 2005  . Joint replacement Bilateral     L-2004, R-2006  . Total knee arthroplasty Right 07/02/2014    Procedure: Resection of Infected Right Total Knee Arthroplasty with placement antibiotic spacer;  Surgeon: Mauri Pole, MD;  Location: [facility];  Service: Orthopedics;  Laterality: Right;  . Left heart catheterization with coronary/graft angiogram N/A 09/17/2014    Procedure: LEFT HEART CATHETERIZATION WITH Beatrix Fetters;  Surgeon: Troy Sine, MD;  Location: [facility] CATH LAB;  Service: Cardiovascular;  Laterality: N/A;  . Reimplantation of total knee Right 10/08/2014    Procedure: REIMPLANTATION OF RIGHT TOTAL KNEE ARTHROPLASTY WITH REMOVAL OF ANTIBIOTIC SPACER;  Surgeon: Paralee Cancel, MD;  Location: [facility];  Service: Orthopedics;  Laterality: Right;  . Colonoscopy N/A 02/19/2015    Procedure: COLONOSCOPY;  Surgeon: Ladene Artist, MD;  Location[facility] ENDOSCOPY;  Service: Endoscopy;  Laterality: N/A;  . Cardiac catheterization N/A 05/09/2015    Procedure: Right Heart Cath;  Surgeon: Larey Dresser, MD;  Location: [facility] CV LAB;  Service: Cardiovascular;  Laterality: N/A;    Allergies  Allergen Reactions  . Atorvastatin Other (See Comments)    Myalgias in legs  . Crestor [Rosuvastatin] Other (See Comments)    Myalgias in legs  . Morphine And Related Other (See Comments)    "Crazy thoughts, severe headache, sick"  . Prednisone     On two occasions has caused A fib    I have reviewed the patient's current medications . aspirin  324 mg Oral Once     nitroGLYCERIN  Prior to Admission medications   Medication Sig Start Date End Date Taking?  Authorizing Provider  acetaminophen (TYLENOL) 500 MG tablet Take 500-1,000 mg by mouth every 6 (six) hours as needed for moderate pain or headache.   Yes Historical Provider, MD  ALPRAZolam Duanne Moron) 0.5 MG tablet TAKE 1 TABLET BY MOUTH TWICE A DAY AS NEEDED 06/18/15  Yes [name], MD  aspirin EC 81 MG tablet Take 81 mg by mouth daily.   Yes Historical Provider, MD  Cholecalciferol (VITAMIN D-3) 1000 UNITS CAPS Take 2,000 Units by mouth 2 (two) times daily.   Yes Historical Provider, MD  diphenhydrAMINE (BENADRYL) 25 MG tablet Take 50 mg by mouth 2 (two) times daily as needed for allergies (eye allergies).    Yes Historical Provider, MD  furosemide (LASIX) 20 MG tablet Take 1 tablet (20 mg total) by mouth daily as needed for fluid. 05/14/15  Yes Brett Canales, PA-C  hydrALAZINE (APRESOLINE) 25 MG tablet Take 0.5 tablets (12.5 mg total) by mouth every 8 (eight) hours. 06/14/15  Yes Mihai Croitoru, MD  Iron-Vitamins (GERITOL COMPLETE PO) Take 1 tablet by mouth daily.   Yes Historical Provider, MD  Omega-3 Fatty Acids (FISH OIL) 1000 MG CAPS Take 1-2 capsules by  mouth 2 (two) times daily. Takes one in the morning and two capsules at night.   Yes Historical Provider, MD  omeprazole (PRILOSEC) 20 MG capsule Take 1 capsule (20 mg total) by mouth daily. 03/01/15  Yes [name], MD  pindolol (VISKEN) 5 MG tablet Take 0.5 tablets (2.5 mg total) by mouth 2 (two) times daily. 06/17/15  Yes Brett Canales, PA-C  polyethylene glycol (MIRALAX / GLYCOLAX) packet Take 17 g by mouth 2 (two) times daily. Patient taking differently: Take 17 g by mouth daily as needed for moderate constipation.  10/11/14  Yes Danae Orleans, PA-C  Probiotic Product (PROBIOTIC PO) Take 1 capsule by mouth daily.   Yes Historical Provider, MD  rOPINIRole (REQUIP) 0.25 MG tablet Take 0.25 mg by mouth 3 (three) times daily.   Yes Historical Provider, MD  simvastatin (ZOCOR) 40 MG tablet Take 0.5 tablets (20 mg total) by mouth at  bedtime. 06/13/15  Yes [name], MD  tiZANidine (ZANAFLEX) 4 MG capsule Take 4 mg by mouth at bedtime.   Yes Historical Provider, MD  traMADol (ULTRAM) 50 MG tablet Take 1 tablet (50 mg total) by mouth every 12 (twelve) hours as needed for moderate pain. 08/06/15  Yes [name], MD  traZODone (DESYREL) 50 MG tablet Take 1 tablet (50 mg total) by mouth at bedtime. 01/03/15  Yes [name], MD  alendronate (FOSAMAX) 70 MG tablet Take 1 tablet (70 mg total) by mouth once a week. Take with a full glass of water on an empty stomach, remain upright Patient taking differently: Take 70 mg by mouth once a week. Take with a full glass of water on an empty stomach, remain upright  Saturday 03/27/15   [name], MD  isosorbide mononitrate (IMDUR) 30 MG 24 hr tablet Take 1 tablet (30 mg total) by mouth daily. 05/16/15   Mihai Croitoru, MD  nitroGLYCERIN (NITROSTAT) 0.4 MG SL tablet Place 1 tablet (0.4 mg total) under the tongue every 5 (five) minutes as needed for chest pain. 03/21/14   Sanda Klein, MD     Social History   Social History  . Marital Status: Widowed    Spouse Name: N/A  . Number of Children: N/A  . Years of Education: N/A   Occupational History  . Not on file.   Social History Main Topics  . Smoking status: Never Smoker   . Smokeless tobacco: Never Used  . Alcohol Use: No  . Drug Use: No  . Sexual Activity: No   Other Topics Concern  . Not on file   Social History Narrative    Family Status  Relation Status Death Age  . Mother Deceased    Family History  Problem Relation Age of Onset  . Parkinson's disease Mother      ROS:  Full 14 point review of systems complete and found to be negative unless listed above.  Physical Exam: Blood pressure 117/64, pulse 59, temperature 98.3 F (36.8 C), temperature source Oral, resp. rate 13, SpO2 97 %.  General: Well developed, well nourished, female in no acute distress Head: Eyes PERRLA, No xanthomas.  Normocephalic and atraumatic, oropharynx without edema or exudate.  Lungs: Resp regular and unlabored, CTA. Heart: RRR no s3, s4, or murmurs. Neck: No carotid bruits. No lymphadenopathy. No JVD. Abdomen: Bowel sounds present, abdomen soft and non-tender without masses or hernias noted. Msk:  No spine or cva tenderness. No weakness, no joint deformities or effusions. Extremities: No clubbing, cyanosis  or edema. DP/PT/Radials 2+ and equal bilaterally. Neuro: Alert and oriented X 3. No focal deficits noted. Psych:  Good affect, responds appropriately Skin: No rashes or lesions noted.  Labs:   Lab Results  Component Value Date   WBC 4.2 08/14/2015   HGB 11.0* 08/14/2015   HCT 33.5* 08/14/2015   MCV 91.8 08/14/2015   PLT 174 08/14/2015   No results for input(s): INR in the last 72 hours.  Recent Labs Lab 08/14/15 1103  NA 138  K 4.3  CL 106  CO2 26  BUN 21*  CREATININE 1.45*  CALCIUM 9.5  GLUCOSE 103*   MAGNESIUM  Date Value Ref Range Status  05/08/2015 2.1 1.7 - 2.4 mg/dL Final   Recent Labs  08/14/15 1112  TROPIPOC 0.01   No results found for: PROBNP Lab Results  Component Value Date   CHOL 132 09/16/2014   HDL 33* 09/16/2014   LDLCALC 69 09/16/2014   TRIG 148 09/16/2014    Echo: 05/07/2015 LV EF: 60% -  65%  ------------------------------------------------------------------- Indications:   Hypotension - chronic 458.1.  ------------------------------------------------------------------- History:  PMH: Tachybrady syndrome. Bradycardia. Atrial fibrillation. Risk factors: Hypertension. Dyslipidemia.  ------------------------------------------------------------------- Study Conclusions  - Left ventricle: The cavity size was normal. There was mild concentric hypertrophy. Systolic function was normal. The estimated ejection fraction was in the range of 60% to 65%. Wall motion was normal; there were no regional wall motion abnormalities.  Features are consistent with a pseudonormal left ventricular filling pattern, with concomitant abnormal relaxation and increased filling pressure (grade 2 diastolic dysfunction). - Aortic valve: There was trivial regurgitation. - Mitral valve: There was moderate regurgitation. - Left atrium: The atrium was moderately dilated. - Right atrium: The atrium was mildly dilated. - Tricuspid valve: There was moderate regurgitation. - Pulmonary arteries: Systolic pressure was severely increased. PA peak pressure: 85 mm Hg (S).   Right Heart Cath 05/09/2015    Conclusion    1. Elevated left and right heart filling pressures with prominent v-waves in the PCWP tracing.  2. Mixed pulmonary hypertension, I suspect primarily pulmonary venous hypertension but possible pulmonary arterial hypertension component with PVR 3.7 WU.   I think that the primary problem here is diastolic CHF. Creatinine is better today, needs diuresis. Will write for Lasix 60 mg IV bid.     Technique and Indications    Procedure: Right Heart Cath  Indication: Pulmonary hypertension   Procedural Details: The right brachial area was prepped, draped, and anesthetized with 1% lidocaine. There was a pre-existing peripheral IV in the right brachial area. A Swan-Ganz catheter was used for the right heart catheterization. Standard protocol was followed for recording of right heart pressures and sampling of oxygen saturations. Fick cardiac output was calculated. There were no immediate procedural complications. The patient was transferred to the post catheterization recovery area for further monitoring.  Estimated blood loss <50 mL.     Right Heart Pressures RHC Procedural Findings: Hemodynamics (mmHg) RA mean 17 RV 77/21 PA 77/28, mean 49 PCWP mean 27 with prominent v-waves  Oxygen saturations: PA 65% AO 97%  Cardiac Output (Fick) 5.86  Cardiac Index (Fick) 3.55 PVR 3.7 WU    ECG: Vent. rate 70 BPM PR  interval 146 ms QRS duration 86 ms QT/QTc 390/421 ms P-R-T axes 75 54 86  Radiology:  Dg Chest 2 View  08/14/2015  CLINICAL DATA:  Shortness of breath and chest pain EXAM: CHEST  2 VIEW COMPARISON:  May 09, 2015 FINDINGS: There is no  edema or consolidation. Heart size and pulmonary vascularity are normal. No adenopathy. Patient is status post coronary artery bypass grafting. There is arthropathy in both shoulders with postoperative change in the right shoulder. IMPRESSION: No edema or consolidation. Electronically Signed   By: Lowella Grip III M.D.   On: 08/14/2015 11:54    ASSESSMENT AND PLAN:     1. Chest tightness with SOB - Somewhat improved after SL nitro x 3 and lasix 40mg  at home.  - Point-of-care troponin negative. Creatinine 1.45, seems at baseline. Chest x-ray clear. EKG showed NSR with new TWI in aVL.  - Similar feeling of Oct 2016 admission when cath showed severe elevation in right and left heart filling pressures. Seems worsen. diastolic CHF vs pulmonary HTN. No volume overload on exam.   2. CAD s/p CABG x 2  3. PAF - Maintaining sinus rhythm  - CHADS2VASC is at least 5, but with history of GI bleed, she declines anticoagulation  4. CKD, stage II-III - Seems baseline creatinine of 1.3-1.5. Creatinine of 1.45 today.   Dispo: Ok to discharge if 2nd troponin is negative. Discharge on lasix 40mg  qd for 2 days (1/19 to 1/20) and then 20mg  daily. Close outpatient follow up.   SignedLeanor Kail, PA 08/14/2015, 12:48 PM   Co-Sign MD

## 2015-08-14 NOTE — Telephone Encounter (Signed)
Pt called in stating that she woke up with some tightness in her chest and her BP was elevated. After taking her NTG , baby aspirin, and Furosemide her BP is stable but she still has the tightness. She says that she is void of any other symptoms , she just has some tightness.  Please f/u with her.  Thanks

## 2015-08-14 NOTE — ED Notes (Signed)
Pt requests to "hold off" on nitro rt to pain not "being so bad" and it giving her a h/a.

## 2015-08-14 NOTE — ED Provider Notes (Signed)
CSN: [id number]     Arrival date & time 08/14/15  1054 History   First MD Initiated Contact with Patient 08/14/15 1120     Chief Complaint  Patient presents with  . Chest Pain     (Consider location/radiation/quality/duration/timing/severity/associated sxs/prior Treatment) Patient is a 82 y.o. female presenting with chest pain.  Chest Pain Pain location:  Substernal area Pain quality: tightness   Pain radiates to:  Does not radiate Pain radiates to the back: no   Pain severity:  Moderate Onset quality:  Sudden Duration:  4 hours Timing:  Constant Progression:  Improving Chronicity:  Recurrent Relieved by:  Nitroglycerin and aspirin Worsened by:  Nothing tried Ineffective treatments:  None tried Associated symptoms: fatigue and shortness of breath   Associated symptoms: no abdominal pain, no back pain, no cough (mild), no diaphoresis, no fever, no headache, no lower extremity edema, no nausea, no orthopnea and not vomiting    Dr. Recardo Evangelist Cardiology, last left heart cath 2016  Past Medical History  Diagnosis Date  . Hypertension   . CAD (coronary artery disease)     a. s/p CABG x 2 (LIMA->LAD, VG->Diag);  b. 08/2014 Cath: LM nl, LAD 90p, LCX nl, RCA nl/dominant, LIMA->LAD atretic, VG->D2 patent w/ retrograde filling of LAD, EF 55-60%-->Med Rx.  Marland Kitchen Hyperlipemia   . Insomnia   . Diverticulitis 02/21/2012  . GERD (gastroesophageal reflux disease)   . Atrial fibrillation with rapid ventricular response ([place]) 02/2014    a. CHA2DS2VASc = 6 -> on eliquis;  b. 02/2014 s/p DCCV;  c. 08/2014 Echo: EF 55-60%, Gr 2 DD, mild MR, triv AI.  Marland Kitchen Arthritis     back   Past Surgical History  Procedure Laterality Date  . Coronary artery bypass graft  2006    off-pump bypass surgery with LIMA to the LAD and SVG to the second diagonal artery ( performed by Dr Servando Snare)   . Tee without cardioversion N/A 02/28/2014    Procedure: TRANSESOPHAGEAL ECHOCARDIOGRAM (TEE);  Surgeon: Sanda Klein, MD;   Location: [place];  Service: Cardiovascular;  Laterality: N/A;  . Cardioversion N/A 02/28/2014    Procedure: CARDIOVERSION;  Surgeon: Sanda Klein, MD;  Location: [facility];  Service: Cardiovascular;  Laterality: N/A;  . Cataract extraction Bilateral 2014    with lens implanted  . Abdominal hysterectomy  1975  . Rotator cuff repair Bilateral     r-1999, l- 2005  . Joint replacement Bilateral     L-2004, R-2006  . Total knee arthroplasty Right 07/02/2014    Procedure: Resection of Infected Right Total Knee Arthroplasty with placement antibiotic spacer;  Surgeon: Mauri Pole, MD;  Location: [facility];  Service: Orthopedics;  Laterality: Right;  . Left heart catheterization with coronary/graft angiogram N/A 09/17/2014    Procedure: LEFT HEART CATHETERIZATION WITH Beatrix Fetters;  Surgeon: Troy Sine, MD;  Location: [facility] CATH LAB;  Service: Cardiovascular;  Laterality: N/A;  . Reimplantation of total knee Right 10/08/2014    Procedure: REIMPLANTATION OF RIGHT TOTAL KNEE ARTHROPLASTY WITH REMOVAL OF ANTIBIOTIC SPACER;  Surgeon: Paralee Cancel, MD;  Location: [facility];  Service: Orthopedics;  Laterality: Right;  . Colonoscopy N/A 02/19/2015    Procedure: COLONOSCOPY;  Surgeon: Ladene Artist, MD;  Location: [facility] ENDOSCOPY;  Service: Endoscopy;  Laterality: N/A;  . Cardiac catheterization N/A 05/09/2015    Procedure: Right Heart Cath;  Surgeon: Larey Dresser, MD;  Location: [place] [facility] LAB;  Service: Cardiovascular;  Laterality: N/A;   Family History  Problem  Relation Age of Onset  . Parkinson's disease Mother    Social History  Substance Use Topics  . Smoking status: Never Smoker   . Smokeless tobacco: Never Used  . Alcohol Use: No   OB History    No data available     Review of Systems  Constitutional: Positive for fatigue. Negative for fever and diaphoresis.  HENT: Negative for sore throat.   Eyes: Negative for visual disturbance.  Respiratory: Positive for  shortness of breath. Negative for cough (mild).   Cardiovascular: Positive for chest pain. Negative for orthopnea.  Gastrointestinal: Negative for nausea, vomiting and abdominal pain.  Genitourinary: Negative for difficulty urinating.  Musculoskeletal: Negative for back pain and neck pain.  Skin: Negative for rash.  Neurological: Negative for syncope and headaches.      Allergies  Atorvastatin; Crestor; Morphine and related; and Prednisone  Home Medications   Prior to Admission medications   Medication Sig Start Date End Date Taking? Authorizing Provider  acetaminophen (TYLENOL) 500 MG tablet Take 500-1,000 mg by mouth every 6 (six) hours as needed for moderate pain or headache.   Yes Historical Provider, MD  ALPRAZolam Duanne Moron) 0.5 MG tablet TAKE 1 TABLET BY MOUTH TWICE A DAY AS NEEDED 06/18/15  Yes [name], MD  aspirin EC 81 MG tablet Take 81 mg by mouth daily.   Yes Historical Provider, MD  Cholecalciferol (VITAMIN D-3) 1000 UNITS CAPS Take 2,000 Units by mouth 2 (two) times daily.   Yes Historical Provider, MD  diphenhydrAMINE (BENADRYL) 25 MG tablet Take 50 mg by mouth 2 (two) times daily as needed for allergies (eye allergies).    Yes Historical Provider, MD  furosemide (LASIX) 20 MG tablet Take 1 tablet (20 mg total) by mouth daily as needed for fluid. 05/14/15  Yes Brett Canales, PA-C  hydrALAZINE (APRESOLINE) 25 MG tablet Take 0.5 tablets (12.5 mg total) by mouth every 8 (eight) hours. 06/14/15  Yes Mihai Croitoru, MD  Iron-Vitamins (GERITOL COMPLETE PO) Take 1 tablet by mouth daily.   Yes Historical Provider, MD  Omega-3 Fatty Acids (FISH OIL) 1000 MG CAPS Take 1-2 capsules by mouth 2 (two) times daily. Takes one in the morning and two capsules at night.   Yes Historical Provider, MD  omeprazole (PRILOSEC) 20 MG capsule Take 1 capsule (20 mg total) by mouth daily. 03/01/15  Yes [name], MD  pindolol (VISKEN) 5 MG tablet Take 0.5 tablets (2.5 mg total) by mouth 2  (two) times daily. 06/17/15  Yes Brett Canales, PA-C  polyethylene glycol (MIRALAX / GLYCOLAX) packet Take 17 g by mouth 2 (two) times daily. Patient taking differently: Take 17 g by mouth daily as needed for moderate constipation.  10/11/14  Yes Danae Orleans, PA-C  Probiotic Product (PROBIOTIC PO) Take 1 capsule by mouth daily.   Yes Historical Provider, MD  rOPINIRole (REQUIP) 0.25 MG tablet Take 0.25 mg by mouth 3 (three) times daily.   Yes Historical Provider, MD  simvastatin (ZOCOR) 40 MG tablet Take 0.5 tablets (20 mg total) by mouth at bedtime. 06/13/15  Yes [name], MD  tiZANidine (ZANAFLEX) 4 MG capsule Take 4 mg by mouth at bedtime.   Yes Historical Provider, MD  traMADol (ULTRAM) 50 MG tablet Take 1 tablet (50 mg total) by mouth every 12 (twelve) hours as needed for moderate pain. 08/06/15  Yes [name], MD  traZODone (DESYREL) 50 MG tablet Take 1 tablet (50 mg total) by mouth at bedtime. 01/03/15  Yes [name], MD  alendronate (FOSAMAX) 70 MG tablet Take 1 tablet (70 mg total) by mouth once a week. Take with a full glass of water on an empty stomach, remain upright Patient taking differently: Take 70 mg by mouth once a week. Take with a full glass of water on an empty stomach, remain upright  Saturday 03/27/15   [name], MD  isosorbide mononitrate (IMDUR) 30 MG 24 hr tablet Take 1 tablet (30 mg total) by mouth daily. 05/16/15   Mihai Croitoru, MD  nitroGLYCERIN (NITROSTAT) 0.4 MG SL tablet Place 1 tablet (0.4 mg total) under the tongue every 5 (five) minutes as needed for chest pain. 03/21/14   Mihai Croitoru, MD   BP 139/67 mmHg  Pulse 63  Temp(Src) 98.3 F (36.8 C) (Oral)  Resp 16  SpO2 97% Physical Exam  Constitutional: She is oriented to person, place, and time. She appears well-developed and well-nourished. No distress.  HENT:  Head: Normocephalic and atraumatic.  Eyes: Conjunctivae and EOM are normal.  Neck: Normal range of motion.   Cardiovascular: Normal rate, regular rhythm, normal heart sounds and intact distal pulses.  Exam reveals no gallop and no friction rub.   No murmur heard. Pulmonary/Chest: Effort normal and breath sounds normal. No respiratory distress. She has no wheezes. She has no rales.  Abdominal: Soft. She exhibits no distension. There is no tenderness. There is no guarding.  Musculoskeletal: She exhibits no edema or tenderness.  Neurological: She is alert and oriented to person, place, and time.  Skin: Skin is warm and dry. No rash noted. She is not diaphoretic. No erythema.  Nursing note and vitals reviewed.   ED Course  Procedures (including critical care time) Labs Review Labs Reviewed  BASIC METABOLIC PANEL - Abnormal; Notable for the following:    Glucose, Bld 103 (*)    BUN 21 (*)    Creatinine, Ser 1.45 (*)    GFR calc non Af Amer 33 (*)    GFR calc Af Amer 38 (*)    All other components within normal limits  CBC - Abnormal; Notable for the following:    RBC 3.65 (*)    Hemoglobin 11.0 (*)    HCT 33.5 (*)    All other components within normal limits  BRAIN NATRIURETIC PEPTIDE - Abnormal; Notable for the following:    B Natriuretic Peptide 278.0 (*)    All other components within normal limits  HEPATIC FUNCTION PANEL - Abnormal; Notable for the following:    Indirect Bilirubin 0.2 (*)    All other components within normal limits  MAGNESIUM  PHOSPHORUS  TROPONIN I  Randolm Idol, ED    Imaging Review Dg Chest 2 View  08/14/2015  CLINICAL DATA:  Shortness of breath and chest pain EXAM: CHEST  2 VIEW COMPARISON:  May 09, 2015 FINDINGS: There is no edema or consolidation. Heart size and pulmonary vascularity are normal. No adenopathy. Patient is status post coronary artery bypass grafting. There is arthropathy in both shoulders with postoperative change in the right shoulder. IMPRESSION: No edema or consolidation. Electronically Signed   By: Lowella Grip III M.D.   On:  08/14/2015 11:54   I have personally reviewed and evaluated these images and lab results as part of my medical decision-making.   EKG Interpretation   Date/Time:  Wednesday August 14 2015 10:57:38 EST Ventricular Rate:  70 PR Interval:  146 QRS Duration: 86 QT Interval:  390 QTC Calculation: 421 R Axis:  4 Text Interpretation:  Normal sinus rhythm Normal ECG New TW inversion in  aVL Confirmed by [facility] MD, Ancelmo Hunt ([id number]) on 08/14/2015 11:26:29 AM      MDM   Final diagnoses:  Acute diastolic (congestive) heart failure (HCC)  Chest pressure   82 year old female with a history of coronary artery disease status post coronary artery bypass surgery in 2006, last left heart catheterization in February 2016 with single vessel with disease (LIMA to LAD) with adequate filling and recommendation of medical management, atrial fibrillation, tachybradycardia syndrome, CKD, chronic diastolic heart failure presented with chest tightness and shortness of breath. EKG showed nonspecific tw changes in aVL.  Patient had 2 troponins which were both negative. Cardiology was consulted given patient's history of coronary artery disease and chest pressure, and evaluated patient and recommended continued medical management. Cardiology felt patient's symptoms are likely secondary to mild CHF exacerbation, and recommended increasing her Lasix to 40 mg for 2 days.  History, vs not consistent with PE, aortic dissection.  Discussed with patient who prefers discharge home and will discharge per Cardiology recommendations and increase lasix for 2 days and follow up closely. Will return if CP returns. Patient discharged in stable condition with understanding of reasons to return.   Gareth Morgan, MD 08/14/15 [phone]

## 2015-08-14 NOTE — Discharge Instructions (Signed)
increase lasix to 40 mg daily for the next 2 days and then go back to 20 mg daily. She should weigh daily.

## 2015-08-15 ENCOUNTER — Telehealth: Payer: Self-pay | Admitting: Cardiovascular Disease

## 2015-08-15 NOTE — Telephone Encounter (Signed)
Called patient explained through the note of Dr. Debara Pickett what her problem was yesterday.  Also reviewed her DC instructions from Dr, Debara Pickett to increase her Lasix to 40 mg a day for 2 days then return to 20 mg a day.  Understands and repeated instructions correctly

## 2015-08-15 NOTE — Telephone Encounter (Signed)
Pt went to the hospital yesterday,was there all day. Please call so she can give you an update on what went on and what was she was told to do.

## 2015-08-16 ENCOUNTER — Telehealth: Payer: Self-pay | Admitting: Family Medicine

## 2015-08-16 MED ORDER — TRAZODONE HCL 50 MG PO TABS: 100.0000 mg | ORAL_TABLET | Freq: Every day | ORAL | Status: DC

## 2015-08-16 NOTE — Telephone Encounter (Signed)
Is not sleeping at night and hasnt been for several weeks.  Would like dr Andria Frames to call in something

## 2015-08-16 NOTE — Telephone Encounter (Signed)
Will increase trazadone to 100 mg qhs.

## 2015-08-19 ENCOUNTER — Telehealth: Payer: Self-pay | Admitting: Cardiovascular Disease

## 2015-08-19 NOTE — Telephone Encounter (Signed)
Please have her stop the hydralazine, but continue the pindolol

## 2015-08-19 NOTE — Telephone Encounter (Signed)
Spoke with pt, her bp yesterday was 92/65, 132/70, 86/52, and 116/64. She did not take her pindolol or hydralazine last night. Today her bp was 150/71 and then 45 min after meds it went to 140/68.  One hour ago it was 94/57. She reports feeling weak. She has an appt Wednesday this week with the PA and 08-28-15 with dr croitoru. She wanted to know if she should make medication changes prior to appt Wednesday. Will forward for dr croitoru's review

## 2015-08-19 NOTE — Telephone Encounter (Signed)
Jacqueline [name] is calling because he blood pressure keeps going up and down , please call   Thanks

## 2015-08-19 NOTE — Telephone Encounter (Signed)
Spoke with pt, aware of dr croitoru's recommendations.  

## 2015-08-21 ENCOUNTER — Encounter: Payer: Self-pay | Admitting: Cardiology

## 2015-08-21 ENCOUNTER — Ambulatory Visit (INDEPENDENT_AMBULATORY_CARE_PROVIDER_SITE_OTHER): Payer: PPO | Admitting: Cardiology

## 2015-08-21 VITALS — BP 130/72 | HR 60 | Ht 62.0 in | Wt 131.8 lb

## 2015-08-21 DIAGNOSIS — R079 Chest pain, unspecified: Secondary | ICD-10-CM | POA: Diagnosis not present

## 2015-08-21 DIAGNOSIS — Z79899 Other long term (current) drug therapy: Secondary | ICD-10-CM | POA: Diagnosis not present

## 2015-08-21 LAB — BASIC METABOLIC PANEL
BUN: 27 mg/dL — AB (ref 7–25)
CO2: 28 mmol/L (ref 20–31)
Calcium: 10 mg/dL (ref 8.6–10.4)
Chloride: 98 mmol/L (ref 98–110)
Creat: 1.59 mg/dL — ABNORMAL HIGH (ref 0.60–0.88)
Glucose, Bld: 85 mg/dL (ref 65–99)
POTASSIUM: 4.2 mmol/L (ref 3.5–5.3)
SODIUM: 137 mmol/L (ref 135–146)

## 2015-08-21 NOTE — Patient Instructions (Addendum)
Medication Instructions:  RESTART  ISOSORBIDE 15 MG  EVERY DAY  Labwork: BMET TODAY  Testing/Procedures: NONE  Follow-Up: KEEP FOLLOW UP NEXT WEEK  Any Other Special Instructions Will Be Listed Below (If Applicable).     If you need a refill on your cardiac medications before your next appointment, please call your pharmacy.

## 2015-08-21 NOTE — Progress Notes (Signed)
08/21/2015 Jacqueline [name]   1930/11/28  [id number]  Primary Physician Zigmund Gottron, MD Primary Cardiologist: Dr. [name]   Reason for Visit/CC: Post ED f/u for Acute Diastolic CHF Exacerbation  HPI:  82 y/o female, followed by Dr. [name], who presents to clinic today for post ED f/u. She was evaluated at the [facility] ED on 08/14/15 after presenting with a complaint of chest pain and dyspnea.   Her past medical history is significant for CAD s/p coronary artery bypass surgery (LIMA to LAD and SVG to diagonal per Dr. Servando Snare; f/u cath 08/2014 showed an atretic LIMA but good distal flow via the other conduits. She has a h/o normal left ventricular systolic function and a relatively recent normal nuclear perfusion study. She had highly symptomatic atrial fibrillation w/ rapid ventricular response in August 2015 and underwent TEE-guided cardioversion followed by treatment with Eliquis. She does not have a history of stroke or TIA. ELIQUIS was stopped due to GI bleed in July 2016. This has not been restarted at her request. She presented with hypotension and bradycardia in October 2016 , likely due to changes in medication and a simultaneous urinary tract infection. Echo showed normal left ventricular systolic function with pseudo- normal filling. Right heart catheterization showed PA pressure 77/28 , mean wedge pressure 27 , mean right atrial pressure 17 , normal cardiac output 5.8 L/minute.  On 08/14/15, she was seen in the ED by Dr. Debara Pickett. The patient had noted sudden development of dyspnea and chest tightness at home. She took 3 SL NTG and an extra dose of 20 mg of Lasix. Her symptoms improved at home, but she went to the ED for evaluation/reasurrance. Per records, cardiac enzymes were negative x 2. BNP was elevated at 278, but this was lower than previous hospitalizations. Creatinine was stable at 1.45. CXR was unremarkable. Dr. Alain Honey felt that her presentation was consistent with mild CHF  exacerbation. The patient wanted to go home as she was feeling better. Dr. Debara Pickett increased her lasix to 40 mg daily x 2 days, then advised that she go back to her previous dose of 20 mg daily. She was released from the ED and instructed to f/u in our office for reassessment.   Since being released from the ED 1/18, she called the office yesterday, on 1/23, noting fluctuating BP readings at home. Her BP yesterday was 92/65, 132/70, 86/52, and 116/64. She reported feeling week. Dr. [name] was notified and instructed her to stop hydralazine but to continue pindolol.   She presents to clinic today, accompanied by her great granddaughter. Her BP in clinic today is 130/72. She denies any further dyspnea. No LEE, weight gain, orthopnea or PND. She has been adherent with a low sodium diet and daily weights. Her normal weight is ~130 lb. Her weight today at home was 129 lb.   She does admit to recurrent CP. She had an episode of severe chest tightness on 08/16/15, 2 days after her ED visit. Her pain was similar to her chest pain prior to undergoing CABG. It occurred at rest. Symptoms were relieved with SL NT x 3. She called EMS. They obtained EKGs and she was told her tracings were normal. She was told by EMS that it was a panic attack. She was not taken to the ED. Since that time, she denies any recurrent CP. No exertional CP or dyspnea. She was on 30 mg of Imdur in the past but this was discontinued.     Current Outpatient Prescriptions  Medication Sig Dispense Refill  . acetaminophen (TYLENOL) 500 MG tablet Take 500-1,000 mg by mouth every 6 (six) hours as needed for moderate pain or headache.    . ALPRAZolam (XANAX) 0.5 MG tablet Take 0.5 mg by mouth at bedtime as needed for anxiety (anxiety).    Marland Kitchen aspirin EC 81 MG tablet Take 81 mg by mouth daily.    . diphenhydrAMINE (BENADRYL) 25 MG tablet Take 50 mg by mouth 2 (two) times daily as needed for allergies (eye allergies).     . furosemide (LASIX) 20 MG  tablet Take 1 tablet (20 mg total) by mouth daily as needed for fluid. 90 tablet 3  . Iron-Vitamins (GERITOL COMPLETE PO) Take 1 tablet by mouth daily.    . nitroGLYCERIN (NITROSTAT) 0.4 MG SL tablet Place 0.4 mg under the tongue every 5 (five) minutes as needed for chest pain (x 3 pills).    . Omega-3 Fatty Acids (FISH OIL) 1000 MG CAPS Take 1-2 capsules by mouth 2 (two) times daily. Takes one in the morning and two capsules at night.    Marland Kitchen omeprazole (PRILOSEC) 20 MG capsule Take 1 capsule (20 mg total) by mouth daily. 90 capsule 3  . pindolol (VISKEN) 5 MG tablet Take 0.5 tablets (2.5 mg total) by mouth 2 (two) times daily. 180 tablet 2  . polyethylene glycol (MIRALAX / GLYCOLAX) packet Take 17 g by mouth 3 (three) times a week.    . Probiotic Product (PROBIOTIC PO) Take 1 capsule by mouth daily.    Marland Kitchen rOPINIRole (REQUIP) 0.25 MG tablet Take 0.25 mg by mouth 3 (three) times daily.    . simvastatin (ZOCOR) 40 MG tablet Take 0.5 tablets (20 mg total) by mouth at bedtime. 45 tablet 3  . traMADol (ULTRAM) 50 MG tablet Take 1 tablet (50 mg total) by mouth every 12 (twelve) hours as needed for moderate pain. 60 tablet 5  . traZODone (DESYREL) 50 MG tablet Take 2 tablets (100 mg total) by mouth at bedtime. 90 tablet 3   No current facility-administered medications for this visit.    Allergies  Allergen Reactions  . Atorvastatin Other (See Comments)    Myalgias in legs  . Crestor [Rosuvastatin] Other (See Comments)    Myalgias in legs  . Morphine And Related Other (See Comments)    "Crazy thoughts, severe headache, sick"  . Prednisone     On two occasions has caused A fib    Social History   Social History  . Marital Status: Widowed    Spouse Name: N/A  . Number of Children: N/A  . Years of Education: N/A   Occupational History  . Not on file.   Social History Main Topics  . Smoking status: Never Smoker   . Smokeless tobacco: Never Used  . Alcohol Use: No  . Drug Use: No  .  Sexual Activity: No   Other Topics Concern  . Not on file   Social History Narrative     Review of Systems: General: negative for chills, fever, night sweats or weight changes.  Cardiovascular: negative for chest pain, dyspnea on exertion, edema, orthopnea, palpitations, paroxysmal nocturnal dyspnea or shortness of breath Dermatological: negative for rash Respiratory: negative for cough or wheezing Urologic: negative for hematuria Abdominal: negative for nausea, vomiting, diarrhea, bright red blood per rectum, melena, or hematemesis Neurologic: negative for visual changes, syncope, or dizziness All other systems reviewed and are otherwise negative except as noted above.    Blood pressure 130/72, pulse  60, height 5\' 2"  (1.575 m), weight 131 lb 12.8 oz (59.784 kg).  General appearance: alert, cooperative and no distress Neck: no carotid bruit and no JVD Lungs: clear to auscultation bilaterally Heart: regular rate and rhythm, S1, S2 normal, no murmur, click, rub or gallop Extremities: no LEE' Pulses: 2+ and symmetric Skin: warm and dry Neurologic: Grossly normal  EKG sinus bradycardia. 59 bpm. No ischemia.   ASSESSMENT AND PLAN:   1. Acute CHF Exacerbation: dyspnea has resolved after temporary increase in lasix to 40 mg x 2 days. She is now back on original dose of 20 mg daily. She is euvolemic on physical exam. Weight is stable at 129 lb (baseline dry weight is 130 lb). She has been adherent with a low sodium diet. BP is controlled. Continue 20 mg of Lasix, + daily weights and low sodium diet. Patient advised to notify our office if > 3 lb weight gain in a 24 hr period. We will check a f/u BMP today given recent lasix adjustment. She has h/o normal LV systolic function. Will defer decision regarding repeat echo to Dr. [name], whom she is scheduled to see next week on 08/28/15.  2. CAD: s/p CABG.  F/u cath 08/2014 showed an atretic LIMA but good distal flow via the other conduits.  She has had two episodes of chest tightness in the past week, each relieved with SL NTG. Her last occurrence was  5 days ago. She denies any recurrence since, including no exertional chest pain or dyspnea. EKG is ok. Since she has responded to SL NTG each occurrence, we will try to restart her on Imdur, but at a low dose to prevent hypotension. Will start at 15 mg daily. Patient is to monitor BP closely. She can discontinue if issues with hypotension. Dr. [name] to reassess symptoms at time of f/u next week. If recurrent symptoms, may need to consider stress test.   4. Borderline Hypotension: Improved with discontinuation of hydralazine, which she was taking TID. BP today is 130/72 and has been consistently running in the [id number] systolic at home since the recent medication change. Given recent CP and h/o CAD, we will try restarting low dose Imdur, 15 mg daily. Patient advised to discontinue if recurrent hypotension.   3. PAF: Maintaining NSR. HR is controlled in the upper 50s. Eliquis discontinued in 2016 2/2 GIB. Patient request not to resume.   PLAN  Keep f/u with Dr. [name] 08/28/15 for repeat assessment.   [name] PA-C 08/21/2015 10:59 AM

## 2015-08-23 ENCOUNTER — Other Ambulatory Visit: Payer: Self-pay | Admitting: Family Medicine

## 2015-08-27 ENCOUNTER — Other Ambulatory Visit: Payer: Self-pay | Admitting: *Deleted

## 2015-08-27 DIAGNOSIS — I480 Paroxysmal atrial fibrillation: Secondary | ICD-10-CM

## 2015-08-28 ENCOUNTER — Encounter: Payer: Self-pay | Admitting: Cardiovascular Disease

## 2015-08-28 ENCOUNTER — Ambulatory Visit (INDEPENDENT_AMBULATORY_CARE_PROVIDER_SITE_OTHER): Payer: PPO | Admitting: Cardiovascular Disease

## 2015-08-28 VITALS — BP 100/64 | HR 64 | Ht 62.0 in | Wt 133.0 lb

## 2015-08-28 DIAGNOSIS — N189 Chronic kidney disease, unspecified: Secondary | ICD-10-CM

## 2015-08-28 DIAGNOSIS — N289 Disorder of kidney and ureter, unspecified: Secondary | ICD-10-CM

## 2015-08-28 DIAGNOSIS — I5032 Chronic diastolic (congestive) heart failure: Secondary | ICD-10-CM | POA: Diagnosis not present

## 2015-08-28 DIAGNOSIS — N182 Chronic kidney disease, stage 2 (mild): Secondary | ICD-10-CM

## 2015-08-28 DIAGNOSIS — I480 Paroxysmal atrial fibrillation: Secondary | ICD-10-CM | POA: Diagnosis not present

## 2015-08-28 DIAGNOSIS — R300 Dysuria: Secondary | ICD-10-CM

## 2015-08-28 LAB — URINALYSIS
BILIRUBIN URINE: NEGATIVE
GLUCOSE, UA: NEGATIVE
HGB URINE DIPSTICK: NEGATIVE
KETONES UR: NEGATIVE
Nitrite: NEGATIVE
PH: 6 (ref 5.0–8.0)
Protein, ur: NEGATIVE
Specific Gravity, Urine: 1.011 (ref 1.001–1.035)

## 2015-08-28 MED ORDER — PINDOLOL 5 MG PO TABS: 2.5000 mg | ORAL_TABLET | Freq: Every day | ORAL | Status: DC

## 2015-08-28 NOTE — Progress Notes (Signed)
Patient ID: Jacqueline [name], female   DOB: 08-22-1930, 82 y.o.   MRN: [id number]    Cardiology Office Note    Date:  08/28/2015   ID:  Jacqueline [name], DOB 27-Apr-1931, MRN [id number]  PCP:  Zigmund Gottron, MD  Cardiologist:   Sanda Klein, MD   Chief Complaint  Patient presents with  . Follow-up    some chest pain, some shortness of breath, no swelling, cramping in legs, no dizziness or lightheadedness    History of Present Illness:  Jacqueline [name] is a 82 y.o. female with coronary artery disease and previous bypass surgery, paroxysmal atrial fibrillation requiring previous cardioversion, diastolic heart failure with recent acute exacerbation, history of systemic hypertension presents for follow-up after an emergency room visit on January 18 for shortness of breath in an office visit with Jacqueline [name] January 25.  Since her last appointment she has had one chest pain episode that lasted for 10 minutes and resolved without sublingual nitroglycerin. It occurred at rest. Her dyspnea seems to be at baseline, NYHA functional class II. She had some problems with low blood pressure and we try to stop hydralazine, but she did not feel well and restarted it. This morning, she again has mild dizziness and her blood pressure at home was 93/46. She is taking extremely low doses of hydralazine, isosorbide mononitrate and pindolol. Diuretic use has also been limited by acute on chronic renal insufficiency.  She has not had palpitations or any clinically evident episodes of atrial fibrillation. She underwent cardioversion in 2015 but anticoagulation was stopped following GI bleeding in 2016 and has not been restarted. She has evidence of diastolic heart failure with elevated filling pressure and fairly severe pulmonary artery hypertension at around 80 mmHg confirmed by both right heart catheterization and echocardiographic findings. Coronary angiography 3 2016 showed atretic LIMA to LAD but patent SVG to  diagonal and good distal flow  She continues to live independently and fiercely values her autonomy. She has family members that live very close by.   Past Medical History  Diagnosis Date  . Hypertension   . CAD (coronary artery disease)     a. s/p CABG x 2 (LIMA->LAD, VG->Diag);  b. 08/2014 Cath: LM nl, LAD 90p, LCX nl, RCA nl/dominant, LIMA->LAD atretic, VG->D2 patent w/ retrograde filling of LAD, EF 55-60%-->Med Rx.  Marland Kitchen Hyperlipemia   . Insomnia   . Diverticulitis 02/21/2012  . GERD (gastroesophageal reflux disease)   . Atrial fibrillation with rapid ventricular response ([place]) 02/2014    a. CHA2DS2VASc = 6 -> on eliquis;  b. 02/2014 s/p DCCV;  c. 08/2014 Echo: EF 55-60%, Gr 2 DD, mild MR, triv AI.  Marland Kitchen Arthritis     back    Past Surgical History  Procedure Laterality Date  . Coronary artery bypass graft  2006    off-pump bypass surgery with LIMA to the LAD and SVG to the second diagonal artery ( performed by Dr Servando Snare)   . Tee without cardioversion N/A 02/28/2014    Procedure: TRANSESOPHAGEAL ECHOCARDIOGRAM (TEE);  Surgeon: Sanda Klein, MD;  Location: [facility];  Service: Cardiovascular;  Laterality: N/A;  . Cardioversion N/A 02/28/2014    Procedure: CARDIOVERSION;  Surgeon: Sanda Klein, MD;  Location: [facility];  Service: Cardiovascular;  Laterality: N/A;  . Cataract extraction Bilateral 2014    with lens implanted  . Abdominal hysterectomy  1975  . Rotator cuff repair Bilateral     r-1999, l- 2005  . Joint replacement Bilateral  L-2004, R-2006  . Total knee arthroplasty Right 07/02/2014    Procedure: Resection of Infected Right Total Knee Arthroplasty with placement antibiotic spacer;  Surgeon: Mauri Pole, MD;  Location: [facility];  Service: Orthopedics;  Laterality: Right;  . Left heart catheterization with coronary/graft angiogram N/A 09/17/2014    Procedure: LEFT HEART CATHETERIZATION WITH Beatrix Fetters;  Surgeon: Troy Sine, MD;  Location: [facility] CATH  LAB;  Service: Cardiovascular;  Laterality: N/A;  . Reimplantation of total knee Right 10/08/2014    Procedure: REIMPLANTATION OF RIGHT TOTAL KNEE ARTHROPLASTY WITH REMOVAL OF ANTIBIOTIC SPACER;  Surgeon: Paralee Cancel, MD;  Location: [facility];  Service: Orthopedics;  Laterality: Right;  . Colonoscopy N/A 02/19/2015    Procedure: COLONOSCOPY;  Surgeon: Ladene Artist, MD;  Location[facility] ENDOSCOPY;  Service: Endoscopy;  Laterality: N/A;  . Cardiac catheterization N/A 05/09/2015    Procedure: Right Heart Cath;  Surgeon: Larey Dresser, MD;  Location: [facility] CV LAB;  Service: Cardiovascular;  Laterality: N/A;    Outpatient Prescriptions Prior to Visit  Medication Sig Dispense Refill  . acetaminophen (TYLENOL) 500 MG tablet Take 500-1,000 mg by mouth every 6 (six) hours as needed for moderate pain or headache.    . ALPRAZolam (XANAX) 0.5 MG tablet Take 0.5 mg by mouth at bedtime as needed for anxiety (anxiety).    Marland Kitchen aspirin EC 81 MG tablet Take 81 mg by mouth daily.    . diphenhydrAMINE (BENADRYL) 25 MG tablet Take 50 mg by mouth 2 (two) times daily as needed for allergies (eye allergies).     . furosemide (LASIX) 20 MG tablet Take 1 tablet (20 mg total) by mouth daily as needed for fluid. 90 tablet 3  . Iron-Vitamins (GERITOL COMPLETE PO) Take 1 tablet by mouth daily.    . isosorbide mononitrate (IMDUR) 15 mg TB24 24 hr tablet Take 15 mg by mouth daily.    . nitroGLYCERIN (NITROSTAT) 0.4 MG SL tablet Place 0.4 mg under the tongue every 5 (five) minutes as needed for chest pain (x 3 pills).    . Omega-3 Fatty Acids (FISH OIL) 1000 MG CAPS Take 1-2 capsules by mouth 2 (two) times daily. Takes one in the morning and two capsules at night.    Marland Kitchen omeprazole (PRILOSEC) 20 MG capsule Take 1 capsule (20 mg total) by mouth daily. 90 capsule 3  . polyethylene glycol powder (GLYCOLAX/MIRALAX) powder TAKE 17 G BY MOUTH DAILY. 527 g 3  . Probiotic Product (PROBIOTIC PO) Take 1 capsule by mouth daily.    Marland Kitchen  rOPINIRole (REQUIP) 0.25 MG tablet Take 0.25 mg by mouth 3 (three) times daily.    . simvastatin (ZOCOR) 40 MG tablet Take 0.5 tablets (20 mg total) by mouth at bedtime. 45 tablet 3  . traMADol (ULTRAM) 50 MG tablet Take 1 tablet (50 mg total) by mouth every 12 (twelve) hours as needed for moderate pain. 60 tablet 5  . traZODone (DESYREL) 50 MG tablet Take 2 tablets (100 mg total) by mouth at bedtime. 90 tablet 3  . pindolol (VISKEN) 5 MG tablet Take 0.5 tablets (2.5 mg total) by mouth 2 (two) times daily. 180 tablet 2   No facility-administered medications prior to visit.     Allergies:   Atorvastatin; Crestor; Morphine and related; and Prednisone   Social History   Social History  . Marital Status: Widowed    Spouse Name: N/A  . Number of Children: N/A  . Years of Education: N/A   Social  History Main Topics  . Smoking status: Never Smoker   . Smokeless tobacco: Never Used  . Alcohol Use: No  . Drug Use: No  . Sexual Activity: No   Other Topics Concern  . None   Social History Narrative     Family History:  The patient's family history includes Parkinson's disease in her mother.   ROS:   Please see the history of present illness.    ROS All other systems reviewed and are negative.   PHYSICAL EXAM:   VS:  BP 100/64 mmHg  Pulse 64  Ht 5\' 2"  (1.575 m)  Wt 60.328 kg (133 lb)  BMI 24.32 kg/m2   GEN: Well nourished, well developed, in no acute distress HEENT: normal Neck: no JVD, carotid bruits, or masses Cardiac: RRR; no murmurs, rubs, or gallops,no edema  Respiratory:  clear to auscultation bilaterally, normal work of breathing GI: soft, nontender, nondistended, + BS MS: no deformity or atrophy Skin: warm and dry, no rash Neuro:  Alert and Oriented x 3, Strength and sensation are intact Psych: euthymic mood, full affect  Wt Readings from Last 3 Encounters:  08/28/15 60.328 kg (133 lb)  08/21/15 59.784 kg (131 lb 12.8 oz)  06/05/15 61.054 kg (134 lb 9.6 oz)        Studies/Labs Reviewed:   EKG:  EKG is not ordered today.    Recent Labs: 05/07/2015: TSH 1.086 08/14/2015: ALT 20; B Natriuretic Peptide 278.0*; Hemoglobin 11.0*; Magnesium 2.1; Platelets 174 08/21/2015: BUN 27*; Creat 1.59*; Potassium 4.2; Sodium 137   Lipid Panel    Component Value Date/Time   CHOL 132 09/16/2014 0423   TRIG 148 09/16/2014 0423   HDL 33* 09/16/2014 0423   CHOLHDL 4.0 09/16/2014 0423   VLDL 30 09/16/2014 0423   LDLCALC 69 09/16/2014 0423    ASSESSMENT:    1. CAD s/p CABG  2. CHF (congestive heart failure), NYHA class II, chronic, diastolic ([place])   3. PAF (paroxysmal atrial fibrillation) ([place])   4. Acute on chronic renal insufficiency ([facility])   5. Chronic kidney disease, stage 2, mildly decreased GFR      PLAN:  In order of problems listed above:  1. CAD: She has occasional episodes of chest discomfort that responded to nitroglycerin. He seemed to be decreasing in frequency since she restarted taking treatment with isosorbide. Coronary evaluation we are angiography within the last year did not show any significant culprit lesions. I wonder if her symptoms might be related to transient spikes in pulmonary artery hypertension. She has symptomatic hypotension. She did not tolerate discontinuation of hydralazine. Will wean off pindolol. Recent lipid profile is favorable  2. CHF: She has preserved left ventricular systolic function and overall has good functional status but had remarkably severe pulmonary artery hypertension at the time of right heart catheterization last October. At that time pulmonary artery wedge pressure was also elevated. Unclear if there is also a component of intrinsic pulmonary arteriolar disease. As far as I can tell she is currently clinically euvolemic. Recently her BNP was minimally elevated, especially considering her age and gender and renal function. Diuretics should be titrated with caution due to concomitant kidney disease. 3. PAF:  Weaning off pindolol due to symptomatic hypotension. This will leave her unprotected if she were to have recurrent atrial fibrillation with rapid ventricular response. At that point, would consider use of amiodarone for rhythm/rate control. She does not want to restart anticoagulation due to her previous problems with GI bleeding understanding that her  embolic risk is elevated. CHADVasc score 6 (age 82, CHF, CAD, HTN, gender).  4. Acute on CKD: need to avoid excessive diuresis due to risk of renal insufficiency worsening.  5. She describes dysuria and back pain. Will order urinalysis.    Medication Adjustments/Labs and Tests Ordered: Current medicines are reviewed at length with the patient today.  Concerns regarding medicines are outlined above.  Medication changes, Labs and Tests ordered today are listed in the Patient Instructions below. Patient Instructions  Your physician has recommended you make the following change in your medication:   Take only the evening dose of Pindolol x 2 days then stop.  Your physician recommends that you return for lab work in: urinalysis  At [facility] lab  Dr. [name] recommends that you schedule a follow-up appointment in: 3 months          Signed, Sanda Klein, MD  08/28/2015 2:31 PM    [address], [place], [place]  [address] Phone: [phone]; Fax: [phone]

## 2015-08-28 NOTE — Patient Instructions (Signed)
Your physician has recommended you make the following change in your medication:   Take only the evening dose of Pindolol x 2 days then stop.  Your physician recommends that you return for lab work in: urinalysis  At [facility] lab  Dr. [name] recommends that you schedule a follow-up appointment in: 3 months

## 2015-08-29 ENCOUNTER — Telehealth: Payer: Self-pay | Admitting: *Deleted

## 2015-08-29 NOTE — Telephone Encounter (Signed)
-----   Message from Sanda Klein, MD sent at 08/29/2015  8:33 AM EST ----- UA does look like a possible UTI.  Please Rx Cipro 500 mg BID x 7 days, no RF

## 2015-08-29 NOTE — Telephone Encounter (Signed)
Pt is calling you back in regards to lab work . Please f/u with her  Thanks

## 2015-08-29 NOTE — Telephone Encounter (Signed)
Left message to call back  

## 2015-08-29 NOTE — Telephone Encounter (Signed)
Follow up      Calling to talk to the nurse regarding her lab work.  Please call before you leave today

## 2015-08-30 NOTE — Telephone Encounter (Signed)
Follow up     Pt is calling to talk to a nurse regarding lab work

## 2015-08-30 NOTE — Telephone Encounter (Signed)
F/u   Pt waiting on call back concerning test results.

## 2015-09-05 ENCOUNTER — Telehealth: Payer: Self-pay | Admitting: Cardiovascular Disease

## 2015-09-05 MED ORDER — CIPROFLOXACIN HCL 500 MG PO TABS: ORAL_TABLET | ORAL | Status: DC

## 2015-09-05 NOTE — Telephone Encounter (Signed)
New Message  Pt feeling weak and c/o BP issues- recorded BP this  9am- 128/73 p 115 and 30 minutes later 97/61 p 115. Pt recorded BP @ 12pm today at 98/63 p 92. Please call back and discuss.

## 2015-09-05 NOTE — Telephone Encounter (Signed)
Returned call to patient.She stated she feels awful.Stated her B/P up and down.Pulse elevated.See readings below.Just checked B/P 109/78 pulse 105.Stated she feels weak,dizzy,pain across lower back.Stated she had a urinalysis 08/29/15 and never heard results.Advised she does have a possible UTI.Dr.Croitoru advised Cipro 500 mg twice a day for 7 days.Advised hold Imdur tomorrow.Increase fluids.Advised to call back if not better.

## 2015-09-06 ENCOUNTER — Other Ambulatory Visit: Payer: Self-pay | Admitting: *Deleted

## 2015-09-06 MED ORDER — TRAZODONE HCL 100 MG PO TABS: 100.0000 mg | ORAL_TABLET | Freq: Every day | ORAL | Status: DC

## 2015-09-06 NOTE — Telephone Encounter (Signed)
Refill requested for Trazodone.  Derl Barrow, RN

## 2015-09-11 ENCOUNTER — Other Ambulatory Visit: Payer: Self-pay

## 2015-09-11 DIAGNOSIS — I509 Heart failure, unspecified: Secondary | ICD-10-CM

## 2015-09-11 NOTE — Patient Outreach (Addendum)
[facility]) Care Management  09/11/2015  Jacqueline [name] Jan 23, 1931 [id number]  Telephone Screen  Referral Date: 09/10/15 Referral Source: HTA tier 4 list Referral Reason: " Diagnosis of DM and CHF"   Outreach attempt # 1 to patient. Patient reached. Screening completed.  Social: Patient resides in her home alone. She states that her son lives behind her. She is independent with her ADLs. Denies any recent falls but states she feels like her legs are "going to give out at times.". She has walker in the home.   Conditions: She has h/o: CAD s/p CABG, HTN, CHF,stroke/TIA, CKD and HLD. Patient voices not feeling well and doing well lately. She states she is have been feeling "awful". She reports ongoing issues with her BP being up and down. She has had her meds adjusted but still voices issues. Patient states she is symptomatic with weakness,dizziness and fatigue with BP changes. She saw cardiologist earlier this month regarding the matter. She was noted to have a UTI and given antibiotics to complete. She also states she was in the ER on 08/14/15 for chest pain and dyspnea. No recent episodes reported.   Medications: She is taking greater than 10 meds. She denies any issues with affordability. She is concerned that some of her issues now are possibly related to her med regimen.  Appointments: Patient voices that next PCP appt with Dr. Andria Frames and cardiologist appt with Dr. [name] are not until May unless her symptoms worsen.  Consent: Patient familiar with [facility] services as she had services last year. She gave verbal consent for [facility] services again.   Plan: RN CM will notify [facility] administrative assistant of case status. RN CM will send [facility] community referral for further in home eval and assessment. RN CM will send [facility] pharmacy referral for polypharmacy med review. RN CM provided patient with [facility] 24hr Nurse [facility]. RN CM reviewed fall and safety measures with patient. RN CM  confirmed patient is aware of when to seek medical attention for changes in condition.  Enzo Montgomery, RN,BSN,CCM [place] Management Telephonic Care Management Coordinator Direct Phone: [phone] Toll Free: [phone] Fax: [phone]

## 2015-09-12 NOTE — Telephone Encounter (Signed)
Jacqueline [name] spoke with the patient on 09/05/15 and gave urine results and recommendations.

## 2015-09-16 DIAGNOSIS — J309 Allergic rhinitis, unspecified: Secondary | ICD-10-CM | POA: Diagnosis not present

## 2015-09-16 DIAGNOSIS — R5382 Chronic fatigue, unspecified: Secondary | ICD-10-CM | POA: Diagnosis not present

## 2015-09-16 DIAGNOSIS — R079 Chest pain, unspecified: Secondary | ICD-10-CM | POA: Diagnosis not present

## 2015-09-16 DIAGNOSIS — M2550 Pain in unspecified joint: Secondary | ICD-10-CM | POA: Diagnosis not present

## 2015-09-17 DIAGNOSIS — R5383 Other fatigue: Secondary | ICD-10-CM | POA: Diagnosis not present

## 2015-09-17 DIAGNOSIS — E271 Primary adrenocortical insufficiency: Secondary | ICD-10-CM | POA: Diagnosis not present

## 2015-09-17 DIAGNOSIS — E559 Vitamin D deficiency, unspecified: Secondary | ICD-10-CM | POA: Diagnosis not present

## 2015-09-17 DIAGNOSIS — I110 Hypertensive heart disease with heart failure: Secondary | ICD-10-CM | POA: Diagnosis not present

## 2015-09-17 DIAGNOSIS — D510 Vitamin B12 deficiency anemia due to intrinsic factor deficiency: Secondary | ICD-10-CM | POA: Diagnosis not present

## 2015-09-19 ENCOUNTER — Other Ambulatory Visit: Payer: Self-pay | Admitting: *Deleted

## 2015-09-19 NOTE — Patient Outreach (Signed)
[facility]) Care Management  09/19/2015  KAHLAN SANJOSE [birth year] [id number]  Called patient to discuss [facility] services and schedule home visit for next week.  Call was successful, and Ms. Gruenberg verbally consented to [facility] services and home visit, acknowledging that she had participated in the program previously. We discussed basic patients rights and responsibilities and planned a home visit for next week, Wednesday September 25, 2015 at 10:00 am.  Ms. Desmidt provided verbal permission to speak with her son, Ader Rieker if necessary (phone [phone]).  Ms. Carney stated that she feels "just give out" most days, and shared that she had taken a shower this morning, which "wore me out."  She described that when this occurs, she "just stops what I am doing and rests," stating that "that usually takes care of the problem."  Ms. Minella stated that after her shower today, she took her BP and it was 127/83, with heart rate 112.  Ms. Latney also stated that she did not sleep well last night, and shared that she "often has trouble sleeping soundly."  Ms. Kinyon stated that has a cane and a walker, but added that she "doesn't use them very much," as she "feels pretty steady" on her feet normally.  She said that she would like to obtain a bath tub seat, for occasions such as this morning, when she becomes tired while showering.  I noticed in EPIC that the patient stated that she had Advanced Directives in 2016, but denied same with 2017 visit. I inquired about this during our phone conversation and she said she did not currently have any Advanced Directives and "did not know what Advanced Directives were."  I explained Advanced Directives to her and she confirmed that she would be interested in considering having Advanced Directives in place.  I contracted with her that I would bring detailed information on Advanced Directives with me when I made my home visit next week.  I let Ms. Ledee know  that she would receive a reminder call prior to our scheduled appointment, and made sure she had my name and phone number should she wish to get in touch with me before our scheduled visit.  Ms. Lorang was very appreciative of the phone call.  Plan: Complete scheduled home visit assessment on Wednesday September 25, 2015 at 10:00 am Take Ms. Wilcock information on Advanced Directives on our scheduled home visit Discuss options for Ms. Brannan to obtain a bath tub/ shower bench  Oneta Rack, RN, BSN, [place] Coordinator [place] Care Management  [phone]

## 2015-09-24 DIAGNOSIS — R5382 Chronic fatigue, unspecified: Secondary | ICD-10-CM | POA: Diagnosis not present

## 2015-09-24 DIAGNOSIS — E784 Other hyperlipidemia: Secondary | ICD-10-CM | POA: Diagnosis not present

## 2015-09-25 ENCOUNTER — Encounter: Payer: Self-pay | Admitting: *Deleted

## 2015-09-25 ENCOUNTER — Other Ambulatory Visit: Payer: Self-pay | Admitting: Cardiovascular Disease

## 2015-09-25 ENCOUNTER — Other Ambulatory Visit: Payer: Self-pay | Admitting: *Deleted

## 2015-09-25 NOTE — Patient Outreach (Signed)
[facility]) Care Management   09/25/2015  SARIYA RANDOL [birth year] [id number]  DASHAWNA KIRCHBERG is an 82 y.o. female with CAD, S/P CABG, HTN, S/P bilateral knee replacements, referred to [place] through Tier-4 High Risk list.   We visited today in Mrs. Millikan's home for an initial home visit for disease management of CHF.  Ms. Petri provided her personal recorded logs of her weight, BP, and heart rate for the month of February  2017.  She also had her current medications organized in an understandable and concise manner and verbally reported an accurate understanding of medication purpose, doses, and prescribed administration schedule for each medication.  Mrs. Cadenas stated and described that she has a strong support network through her two sons and daughters-in-law.  Ms. Commons stated that she still drives to her doctor appointments, shopping/ errands, and cooks and accomplishes ADLs and IADLs without needing assistance.  Ms. Stahr shared that her biggest goal is to "feel less weak," stating that after her knee revision surgery last year she has "just felt weak ever since."  She verbally expressed  That she would like to become "stronger and more active," and shared that she went to a cardiology appointment for a second opinion yesterday on the advice of her 2 sons, "to make sure we aren't missing anything."  She reported that the cardiologist she saw "said Dr. Loletha Grayer is doing everything right," and recommended no changes to her current health plan.  She said the cardiologist "said it was okay for me to participate in gentle exercise, so I went to [employer] and enrolled in their program... I will be starting tomorrow and going 3 times a week."   We discussed disease management of CHF, of which Ms. Shultis has a very good knowledge base.  I provided information around her questions about CHF and I provided emotional support and encouragement for the positive steps she has taken to maintain and  improve her health, such as daily recording of her weight and BP/ HR and independent medication management. I encouraged her to be very gentle/ conservative with adding an exercise program, stressing that she should start the new program slowly, and gradually increase over time, as allowed based on her progress.  Ms. Bengtson verbally acknowledged that she would do so.  Subjective: "I want to be more active and feel stronger."  Objective: BP 122/64 mmHg  Pulse 94  Resp 14  Ht 1.549 m (5\' 1" )  Wt 128 lb (58.06 kg)  BMI 24.20 kg/m2  SpO2 98%  Review of Systems  Constitutional:       Pt. Reports ongoing intermittent weakness since her last knee revision in 2016  Eyes: Positive for discharge and redness.       Pt. Reported that she "came down with pink eye this weekend."  Pt. Stated she went to the doctor and got eye drops and that it is improving as of today.  Pt. Reports this has affected (R) eye only; no symptoms in (L) eye  Respiratory: Negative.   Cardiovascular: Negative.   Gastrointestinal: Negative.   Genitourinary: Negative.   Musculoskeletal: Positive for myalgias, back pain and joint pain.       Pt. Reports ongoing intermittent myalgias, including lower back pain that "goes over my entire lower back," occasional "knee pain after both of my knee replacements."  Skin: Negative.   Neurological: Positive for weakness.       Negative other than general weakness  Psychiatric/Behavioral: Negative.  Physical Exam  Constitutional: She is oriented to person, place, and time. She appears well-developed and well-nourished.  Eyes: Right eye exhibits discharge.  No visible discharge or eye redness; patient reported seocndary to recent pink eye  Cardiovascular: Normal rate, regular rhythm and normal heart sounds.   Respiratory: Effort normal and breath sounds normal.  GI: Soft. Bowel sounds are normal.  Musculoskeletal: She exhibits no edema.  Neurological: She is alert and oriented to  person, place, and time.  Skin: Skin is warm and dry.  Psychiatric: She has a normal mood and affect. Her behavior is normal. Judgment and thought content normal.    Current Medications:   Current Outpatient Prescriptions  Medication Sig Dispense Refill  . acetaminophen (TYLENOL) 500 MG tablet Take 500-1,000 mg by mouth every 6 (six) hours as needed for moderate pain or headache.    . ALPRAZolam (XANAX) 0.5 MG tablet Take 0.5 mg by mouth at bedtime as needed for anxiety (anxiety).    Marland Kitchen aspirin EC 81 MG tablet Take 81 mg by mouth daily.    . ciprofloxacin (CIPRO) 500 MG tablet Take 500 mg twice a day for 7 days. 14 tablet 0  . diphenhydrAMINE (BENADRYL) 25 MG tablet Take 50 mg by mouth 2 (two) times daily as needed for allergies (eye allergies).     . furosemide (LASIX) 20 MG tablet Take 1 tablet (20 mg total) by mouth daily as needed for fluid. 90 tablet 3  . hydrALAZINE (APRESOLINE) 25 MG tablet TAKE 1/2 TABLET (12.5 MG TOTAL) BY MOUTH EVERY 8 (EIGHT) HOURS.  11  . Iron-Vitamins (GERITOL COMPLETE PO) Take 1 tablet by mouth daily.    . isosorbide mononitrate (IMDUR) 15 mg TB24 24 hr tablet Take 15 mg by mouth daily.    . nitroGLYCERIN (NITROSTAT) 0.4 MG SL tablet Place 0.4 mg under the tongue every 5 (five) minutes as needed for chest pain (x 3 pills).    . Omega-3 Fatty Acids (FISH OIL) 1000 MG CAPS Take 1-2 capsules by mouth 2 (two) times daily. Takes one in the morning and two capsules at night.    Marland Kitchen omeprazole (PRILOSEC) 20 MG capsule Take 1 capsule (20 mg total) by mouth daily. 90 capsule 3  . pindolol (VISKEN) 5 MG tablet Take 0.5 tablets (2.5 mg total) by mouth daily. X 2 days then stop as of 08/31/2015 30 tablet 0  . polyethylene glycol powder (GLYCOLAX/MIRALAX) powder TAKE 17 G BY MOUTH DAILY. 527 g 3  . Probiotic Product (PROBIOTIC PO) Take 1 capsule by mouth daily.    Marland Kitchen rOPINIRole (REQUIP) 0.25 MG tablet Take 0.25 mg by mouth 3 (three) times daily.    . simvastatin (ZOCOR) 40 MG  tablet Take 0.5 tablets (20 mg total) by mouth at bedtime. 45 tablet 3  . traMADol (ULTRAM) 50 MG tablet Take 1 tablet (50 mg total) by mouth every 12 (twelve) hours as needed for moderate pain. 60 tablet 5  . traZODone (DESYREL) 100 MG tablet Take 1 tablet (100 mg total) by mouth at bedtime. 90 tablet 3   No current facility-administered medications for this visit.    Functional Status:   In your present state of health, do you have any difficulty performing the following activities: 05/22/2015 05/07/2015  Hearing? N N  Vision? - N  Difficulty concentrating or making decisions? N N  Walking or climbing stairs? N N  Dressing or bathing? N N  Doing errands, shopping? N N  Preparing Food and eating ? N -  Using the Toilet? N -  In the past six months, have you accidently leaked urine? N -  Do you have problems with loss of bowel control? N -  Managing your Medications? N -  Managing your Finances? N -  Housekeeping or managing your Housekeeping? Y -    Fall/Depression Screening:    PHQ 2/9 Scores 09/11/2015 06/05/2015 05/22/2015 05/02/2015 05/01/2015 03/27/2015 03/18/2015  PHQ - 2 Score 0 0 0 0 0 0 0    Assessment:    Ms. Buttrum is very knowledgeable about her current disease management for both CHF and HTN;  She has diligently checked and recorded her weights, BP's, and heart rate every day since she got her diagnosis of CHF last year.  Mrs. Troy understands what all of her medications are and how to take them properly.  Mrs. Whipple states that since her surgical knee revision in 2016, she has felt weaker than usual; she states that she would like to become more active, have more energy, and feel stronger than she currently does.  Ms. Cassity has taken active steps to meet this goal by enrolling in a gentle community exercise program through [employer], after she received approval during a recent cardiology provider visit.  Plan:  Ms. Adema will continue checking and recording her daily weights  and BP's Ms. Graul will begin formal gentle exercise program with [employer] this week, as she reported was recommended by cardiology provider; she will start this program conservatively and gradually increase as allowed by her progress  Ms. Keeven will continue taking medications as prescribed Ms. Nolting will continue making all provider appointments as scheduled Ms. Cagley would like another [place] home visit to check in after she has begun the exercise program; next home visit with this Community CM was scheduled for October 31, 2015 I will send Ms. Hallberg's primary provider a letter to acknowledge [facility] involvement with Ms. Bergstresser  Thank you for the opportunity to participate in Ms. Stopper's care,  Oneta Rack, RN, BSN, [employer] Coordinator [facility] Care Management  [phone]

## 2015-09-25 NOTE — Telephone Encounter (Signed)
Rx(s) sent to pharmacy electronically.  

## 2015-09-26 ENCOUNTER — Encounter: Payer: Self-pay | Admitting: *Deleted

## 2015-09-26 NOTE — Patient Outreach (Signed)
[place] [facility]) Care Management   09/26/2015  KARLETTA PRUDEN 1931-04-22 [id number]  Addendum to [facility] Initial Home visit made yesterday, September 25, 2015:  As Ms. Kolek and I had discussed during our phone call on September 19, 2015, I provided her with information packet for Advance Directive planning, when I gave her [facility] welcome packet and calendar.  Ms. Maus stated that she has told her sons "exactly what my wishes are, and I am not sure I need a formal Advance Directive."  We discussed the advantages of having a formal Advance Directive with Ms. Vasbinder, and she stated that she will read over the information, discuss with her sons, and consider if it is "right for her."  We thoroughly reviewed the [facility] Welcome packet as well as the 2017 [facility] calendar as well. Objective:   ROS  Physical Exam   Oneta Rack, RN, BSN, [place] Coordinator [facility] Care Management  [phone]

## 2015-09-27 ENCOUNTER — Other Ambulatory Visit: Payer: Self-pay | Admitting: Pharmacist

## 2015-09-27 NOTE — Patient Outreach (Signed)
[facility]) Care Management  [place]   09/27/2015  Jacqueline [name] Jul 03, 1931 [id number]  Subjective: Jacqueline [name] is a 82 y.o. female referred to pharmacy for medication review. Reviewed patient's allergy, medication and problem list in order to perform this evaluation.  Call and speak with Jacqueline [name] regarding medications that may put her at increased risk of dizziness and falls. Patient reports that she does take diphenhydramine each morning for her allergies. Discuss with Jacqueline [name] how diphenhydramine can make her sleepy, more at risk for falling and dry her out. Discuss how she may become tolerant to the sedating effects over time, but that the medication can still put her at a higher risk of falling. Patient verbalizes understanding. Discuss with patient alternative allergy medications, such as loratadine, fexofenadine and cetirizine. Patient writes down each of these options and reports that she will try using one of these instead.   Also discuss with patient her ropinirole, which can also put her at increased risk of dizziness. Patient states that she uses this three times daily and that it does a good job of controlling her restless leg symptoms. Reports that if she misses a dose, she notices that her symptoms are much worse. Counsel patient to be aware that this medication, as well as the tramadol and alprazolam that she takes at bedtime, can put her at increased risk for dizziness and falls. Patient verbalizes understanding. Patient reports that she does have some occasional dizziness, particularly when reaching for something overhead or standing up too quickly. Reports that she is careful to rise from laying down to sitting and from sitting to standing slowly to avoid this dizziness.  Jacqueline [name] reports that she has no further medication questions for me. Patient denies any issues with medication affordability. Provide Jacqueline [name] with my phone number.  Objective:    Current Medications: Current Outpatient Prescriptions  Medication Sig Dispense Refill  . diphenhydrAMINE (BENADRYL) 25 MG tablet Take 50 mg by mouth 2 (two) times daily as needed for allergies (eye allergies).     Marland Kitchen acetaminophen (TYLENOL) 500 MG tablet Take 500-1,000 mg by mouth every 6 (six) hours as needed for moderate pain or headache.    . ALPRAZolam (XANAX) 0.5 MG tablet Take 0.5 mg by mouth at bedtime as needed for anxiety (anxiety).    Marland Kitchen aspirin EC 81 MG tablet Take 81 mg by mouth daily.    . furosemide (LASIX) 20 MG tablet Take 1 tablet (20 mg total) by mouth daily as needed for fluid. 90 tablet 3  . hydrALAZINE (APRESOLINE) 25 MG tablet TAKE 1/2 TABLET (12.5 MG TOTAL) BY MOUTH EVERY 8 (EIGHT) HOURS.  11  . Iron-Vitamins (GERITOL COMPLETE PO) Take 1 tablet by mouth daily.    . isosorbide mononitrate (IMDUR) 15 mg TB24 24 hr tablet Take 15 mg by mouth daily.    Marland Kitchen NITROSTAT 0.4 MG SL tablet PLACE 1 TABLET (0.4 MG TOTAL) UNDER THE TONGUE EVERY 5 (FIVE) MINUTES AS NEEDED FOR CHEST PAIN. 25 tablet 1  . Omega-3 Fatty Acids (FISH OIL) 1000 MG CAPS Take 1-2 capsules by mouth 2 (two) times daily. Takes one in the morning and two capsules at night.    Marland Kitchen omeprazole (PRILOSEC) 20 MG capsule Take 1 capsule (20 mg total) by mouth daily. 90 capsule 3  . polyethylene glycol powder (GLYCOLAX/MIRALAX) powder TAKE 17 G BY MOUTH DAILY. 527 g 3  . Probiotic Product (PROBIOTIC PO) Take 1 capsule by mouth daily. Reported  on 09/25/2015    . rOPINIRole (REQUIP) 0.25 MG tablet Take 0.25 mg by mouth 3 (three) times daily.    . simvastatin (ZOCOR) 40 MG tablet Take 0.5 tablets (20 mg total) by mouth at bedtime. 45 tablet 3  . traMADol (ULTRAM) 50 MG tablet Take 1 tablet (50 mg total) by mouth every 12 (twelve) hours as needed for moderate pain. 60 tablet 5  . traZODone (DESYREL) 100 MG tablet Take 1 tablet (100 mg total) by mouth at bedtime. 90 tablet 3   No current facility-administered medications for this  visit.    Assessment:  Drugs sorted by system:  Neurologic/Psychologic: alprazolam, ropinirole, trazodone  Cardiovascular: aspirin, fish oil, furosemide, hydralazine, isosorbide, Nitrostat, simvastatin  Pulmonary/Allergy: diphenhydramine  Gastrointestinal: omeprazole, Miralax, probiotic  Pain: acetaminophen, tramadol  Vitamins/Minerals: Multivitamin   Duplications in therapy: none noted Gaps in therapy:  . Per Cardiology note in EPIC from 08/28/15, patient with atrial fibrillation, with CHA2DS2VASc = 6, "but anticoagulation was stopped following GI bleeding in 2016". Note that anticoagulation versus bleeding risk discussed and re-evaluated with patient at most recent visit. . Note patient with CAD history s/p CABG not on a high-intensity statin. However, note per patient's allergy list in EPIC, patient with reported intolerance, myalgias, to atorvastatin and rosuvastatin.  Medications to avoid in the elderly:  . Diphenhydramine - advised patient to use an alternative allergy medication, such as lortadine or fexofenadine. . Tramadol - patient counseled . Alprazolam - patient counseled Drug interactions:  . Nitrostat + diphenhydramine: Anticholinergic Agents, such as diphenhydramine, may decrease the dissolution of sublingual nitroglycerin tablets, possibly impairing or slowing nitroglycerin absorption. . Tramadol + diphenhydramine + alprazolam + ropinirole: CNS depressants may increase the CNS depressant effect of other CNS depressants. Other issues noted:  Marland Kitchen Per EPIC, on 08/21/15, patient with a serum creatinine of 1.59 mg/dL, an eGFR of about 30 mL/min.  Plan:  1) Patient to discontinue use of diphenhydramine due to increased risk of dizziness and falls. As an alternative, patient to try over the counter loratadine, fexofenadine or cetirizine.  2) Will send a letter within this encounter to patient's PCP, Dr. Andria Frames, regarding patient's multiple CNS depressant medications that can  increase her risk of dizziness and falls, including diphenhydramine, alprazolam, tramadol and ropinirole. Will let provider know that I have advised patient to use a less-sedating allergy medication in place of the diphenhydramine. Will ask that provider assess patient's ongoing need for each of these other medications in light of the patient's reports of ongoing occasional dizziness.  3) Will close pharmacy episode at this time.  Harlow Asa, PharmD Clinical Pharmacist [place] Management [phone]

## 2015-09-30 ENCOUNTER — Other Ambulatory Visit: Payer: Self-pay | Admitting: *Deleted

## 2015-09-30 NOTE — Patient Outreach (Signed)
[facility]) Care Management  09/30/2015  LIANN RACCA [birth year] [id number]  JASIME SILL is an 82 y.o. female with CAD, S/P CABG, HTN, S/P bilateral knee replacements, referred to [place] through Tier-4 High Risk list.  I called Mrs. Kneale today after receiving notification this morning that she had called the 24-hour nurse line over off-hours during the weekend, reporting that her pink eye (originally reported to me Wednesday, September 25, 2015) had worsened.  Today, Mrs. Iwasaki reports that she believes "her eyes are better."  She reported that she had already notified her "regular eye doctor, Dr. Tommy Rainwater," and was instructed to continue the eye drops that had been prescribed by the cardiologist she saw for a second opinion on September 24, 2015.  She stated that she had been "putting the drops in both eyes, like they told me to," and "I believe they are better this morning."  Patient stated that she has delayed starting her exercise class at [employer] until the pink eye is resolved.  Mrs. Purdum reports otherwise doing well and reports no other problems or questions.  Encouraged patient to continue taking eye drops as instructed by Dr. Tommy Rainwater' office, and to notify his office should eyes worsen; patient agrees to do so.    Plan: Will follow up with patient as previously planned with phone outreach next week, unless I hear back from patient first.  Oneta Rack, RN, BSN, CCRN [facility] Care Management  [phone]

## 2015-10-07 ENCOUNTER — Other Ambulatory Visit: Payer: Self-pay | Admitting: *Deleted

## 2015-10-07 NOTE — Patient Outreach (Signed)
[facility]) Care Management  10/07/2015  Jacqueline [name] 12-25-1930 [id number]  Jacqueline [name] is an 82 y.o. female with CAD, S/P CABG, HTN, S/P bilateral knee replacements, referred to [place] through [place] telephone outreach to complete Risk Assessment and check on patient after her pink eye.  Today, Ms. Meara states, "I feel better today than I have in a long time.... I have been feeling really good over the last week."  Ms. Codispoti states that her pink eye has almost cleared up completely; she states that she is finishing the eye drops and her eyes look clear, but are still "a Little itchy."  Ms. Depaola stated that she does not believe she needs to see the eye doctor for this, as she "has improved so much, I think the itching may just be seasonal allergies."  Ms. Syme states that she as changed her mind about participating in [employer], because she does not feel that she has sufficient upper body strength with her history of rotator cuff surgery, and shoulder issues.  Ms. Curl states that she has instead begun walking when she is able on nice days.  Ms. Imbrogno had no complaints or problems that she wished to discuss; we confirmed our next home visit appointment for October 31, 2015 at 10:00 am.  Plan: Next [place] home visit scheduled for October 31, 2015 at 10:00 am  Oneta Rack, RN, BSN, [place] Care Management  [phone]

## 2015-10-08 DIAGNOSIS — H16223 Keratoconjunctivitis sicca, not specified as Sjogren's, bilateral: Secondary | ICD-10-CM | POA: Diagnosis not present

## 2015-10-08 DIAGNOSIS — H18412 Arcus senilis, left eye: Secondary | ICD-10-CM | POA: Diagnosis not present

## 2015-10-08 DIAGNOSIS — H02839 Dermatochalasis of unspecified eye, unspecified eyelid: Secondary | ICD-10-CM | POA: Diagnosis not present

## 2015-10-08 DIAGNOSIS — H18411 Arcus senilis, right eye: Secondary | ICD-10-CM | POA: Diagnosis not present

## 2015-10-30 ENCOUNTER — Telehealth: Payer: Self-pay | Admitting: *Deleted

## 2015-10-30 ENCOUNTER — Other Ambulatory Visit (INDEPENDENT_AMBULATORY_CARE_PROVIDER_SITE_OTHER): Payer: PPO

## 2015-10-30 DIAGNOSIS — R300 Dysuria: Secondary | ICD-10-CM

## 2015-10-30 LAB — POCT UA - MICROSCOPIC ONLY

## 2015-10-30 LAB — POCT URINALYSIS DIPSTICK
Bilirubin, UA: NEGATIVE
Blood, UA: NEGATIVE
Glucose, UA: NEGATIVE
KETONES UA: NEGATIVE
Nitrite, UA: NEGATIVE
PROTEIN UA: NEGATIVE
SPEC GRAV UA: 1.01
Urobilinogen, UA: 0.2
pH, UA: 5.5

## 2015-10-30 NOTE — Assessment & Plan Note (Signed)
Has lower abd, back and right sided pain, "just like" her last UTI in Jan.  No fever, chills or vomiting.  No urgency, minimal dysuria.  Lab only visit to provide UA.  I will treat based on results.

## 2015-10-30 NOTE — Telephone Encounter (Signed)
Pt states that she thinks she has a UTI.  States that she had on in January that started the exact same way, with RUQ pain and NO dysuria but reiterates that she did not have symptoms last time either.  She wants to know if Dr. Andria Frames will ok her bringing a sample up and dropping it off or if she will need an appt. Shiya Fogelman, [place] Spotted, CMA

## 2015-10-31 ENCOUNTER — Encounter: Payer: Self-pay | Admitting: *Deleted

## 2015-10-31 ENCOUNTER — Other Ambulatory Visit: Payer: Self-pay | Admitting: *Deleted

## 2015-10-31 NOTE — Patient Outreach (Signed)
[facility]) Care Management   10/31/2015  Jacqueline [name] September 04, 1930 [id number]  Jacqueline [name] is an 82 y.o. female with CAD, S/P CABG, HTN, S/P bilateral knee replacements, referred to [place] through Tier-4 High Risk list for self management of CHF. Routine vs. Discharge home visit was made today in follow up to our initial home visit on September 25, 2015.    Today, Jacqueline [name] provided her personal recorded logs of her weight, BP, and heart rate for all of 2017. Jacqueline [name] weights, BP's, and heart rate values are all consistent, with weights ranging from 128-130 pounds, BP's 110-138/66-78, and heart rate values from 78-95.    Jacqueline [name] stated that she went to the lab yesterday on the advice of her PCP, Dr. Andria Frames, "because I thought I might have had a UTI.... My back was hurting, like it did when I had one previously.... But he called me last night and said I don't have a UTI, and the pain I felt yesterday is gone today.  I didn't want to take any chances, and would rather be safe than sorry."  Jacqueline [name] also again had her current medications organized in an understandable and concise manner and verbally reported an accurate understanding of medication purpose, doses, and prescribed administration schedule for each medication.  She reported only one new medication since our last visit together, stating that last week her eye doctor put her on restasis for dry eyes.  Jacqueline [name] stated that she had decided not to join [employer] after all, "as it was just too much for my shoulder."  She stated that she had taken up walking whenever she could when the weather is conducive.  Jacqueline [name] confirmed for me that she has had no falls since our last home visit, and has not experienced any dizziness.  Jacqueline [name] acknowledged that she had discussed Advanced Directives with her family, and again reiterated that her family knows her wishes, and that they are "still thinking about" putting formal  Advanced Directives in place "eventually."  She showed me that she still has the documentation which was provided to her during our first home visit.  I provided Jacqueline [name] with some general information on nutrition, (EMMI) as she had previously expressed interest around nutrition, as she enjoys cooking.  Jacqueline [name] and I discussed her progress, and we agreed that she had met her goals from our initial visit in early March 2017.  Jacqueline [name] verbalized that she "doesn't think I need anyone coming out to check on me anymore, I think I am doing really well."  I agreed with her and offered to have another [facility] team member call to check on her regularly, (healthcoach), but she declined, again stating that she believes she is doing very well, and currently does not have health care needs that she "cannot handle herself."  I made sure that Jacqueline [name] knew that she could re-contact me or [facility] CM at any point should she develop needs, and confirmed that she had the contact information for both me directly and for [facility] CM.  Subjective: "I am feeling the best I have in years!  I just can't get over how good I am feeling."  Objective:    BP 142/82 mmHg  Pulse 72  Resp 16  Wt 130 lb (58.968 kg)  SpO2 96%   Review of Systems  Constitutional: Negative.  Negative for malaise/fatigue.  HENT: Negative.   Eyes: Negative.   Respiratory: Negative.  Negative for cough, sputum production, shortness of breath and wheezing.   Cardiovascular: Negative.  Negative for chest pain and leg swelling.  Gastrointestinal: Negative.   Genitourinary: Negative.   Musculoskeletal: Negative.  Negative for myalgias, joint pain and falls.  Skin: Negative.   Neurological: Negative.  Negative for dizziness and weakness.  Psychiatric/Behavioral: Negative.  Negative for depression and memory loss. The patient is not nervous/anxious.     Physical Exam  Constitutional: She is oriented to person, place, and time. She appears  well-developed and well-nourished.  Cardiovascular: Normal rate, regular rhythm and normal heart sounds.   Respiratory: Effort normal and breath sounds normal. No respiratory distress. She has no wheezes. She has no rales.  GI: Soft. Bowel sounds are normal.  Musculoskeletal: She exhibits no edema.  Neurological: She is alert and oriented to person, place, and time.  Skin: Skin is warm and dry.  Psychiatric: She has a normal mood and affect. Her behavior is normal. Judgment and thought content normal.    Assessment:   Mrs. Pesnell feels very good and is taking her medications as prescribed, attending all provider appointments as scheduled, and is staying active.  Ms. Riecke has met her goals from our initial home visit, and does not wish to have a health coach check in with her going forward.  Jacqueline [name] has the contact number for myself and for [facility] CM and will call should she develop further healthcare needs.  Plan:  We will close this current epsiode of [facility] CM, as Ms. Umbarger has successfully met her goals and is feeling good. Ms. Barbier will contact this [place] RN, or the main [facility] phone number should she develop further healthcare needs in the future.  It was my pleasure working with Ms. Leckey, and I will be happy to assist with her care in the future if needed.  [facility] CM Care Plan Problem One        Most Recent Value   Care Plan Problem One  health maintenance planning needs   Role Documenting the Problem One  Care Management Coordinator   Care Plan for Problem One  Active   [facility] Long Term Goal (31-90 days)  Over the next 31 days, patient will verbalize plan for health maintenance needs   [facility] Long Term Goal Start Date  09/25/15   [facility] Long Term Goal Met Date  10/31/15   Interventions for Problem One Long Term Goal  Collaborated with patient about her health goals,  reviewed health maintenance needs,  utilizing teach back method, discussed rationale for maintaining health  maintenance plan   [facility] CM Short Term Goal #1 (0-30 days)  Over the next 30 days, patient will report on new exercise program and verbalize understanding of long term exercise plan   [facility] CM Short Term Goal #1 Start Date  09/25/15   [facility] CM Short Term Goal #1 Met Date  10/31/15 [Pt. states she has been walking for exercise]   Interventions for Short Term Goal #1  Utilizing teachback method, reviewed with patient rationale for long term exercise plan as it relates to health maintenance   [facility] CM Short Term Goal #2 (0-30 days)  Over the next 30 days, patient will verbalize understanding of health examinations needd as a part of overall health maintenace   [facility] CM Short Term Goal #2 Start Date  09/25/15   [facility] CM Short Term Goal #2 Met Date  10/31/15 [Pt. reports she has seen/ has scheduled all of  her providers]   Interventions for Short Term Goal #2  Preparing education packet for next month's visit to review with patient so she can establish personal health maintenance document      Oneta Rack, RN, BSN, [place] Coordinator [facility] Care Management  [phone]

## 2015-11-01 LAB — URINE CULTURE

## 2015-11-29 ENCOUNTER — Ambulatory Visit: Payer: PPO | Admitting: Cardiovascular Disease

## 2015-12-11 ENCOUNTER — Ambulatory Visit: Payer: PPO | Admitting: Family Medicine

## 2015-12-12 ENCOUNTER — Encounter: Payer: Self-pay | Admitting: Family Medicine

## 2015-12-12 ENCOUNTER — Ambulatory Visit (INDEPENDENT_AMBULATORY_CARE_PROVIDER_SITE_OTHER): Payer: PPO | Admitting: Family Medicine

## 2015-12-12 VITALS — BP 118/53 | HR 80 | Temp 98.3°F | Wt 133.1 lb

## 2015-12-12 DIAGNOSIS — G609 Hereditary and idiopathic neuropathy, unspecified: Secondary | ICD-10-CM | POA: Insufficient documentation

## 2015-12-12 MED ORDER — GABAPENTIN 100 MG PO CAPS: 100.0000 mg | ORAL_CAPSULE | Freq: Three times a day (TID) | ORAL | Status: DC

## 2015-12-12 NOTE — Progress Notes (Signed)
   Subjective:    Patient ID: Jacqueline [name], female    DOB: Oct 23, 1930, 82 y.o.   MRN: [id number]  HPI Jacqueline [name] is quite sore today - she and her sister cleaned all the windows in her house yesterday.  Her CC is bilateral foot numbness, most in the great toes, now extending to the 2nd and 3rd toes.  Affects both the plantar and dorsal surfaces.    Some dysasthesia and some anesthesia.  No rash or swelling.     Review of Systems     Objective:   Physical Exam No skin changes.  Subjective decreased sensation of both great toes.  Normal pulses both feet.  Good cap refill.  No swelling.       Assessment & Plan:

## 2015-12-12 NOTE — Patient Instructions (Signed)
Make and appointment with our health coach for your annual Medicare wellness exam.  You have neuropathy - although I am not sure why. I hope the new medication helps.  If we are lucky, the medicine also might help your back pain.

## 2015-12-12 NOTE — Assessment & Plan Note (Signed)
Symptoms sounds clearly neuropathic.  Could have neuropathy from back or idiopathic.  Recent blood work suggests no nurtitional cause.  Will treat empirically with gabapentin.

## 2015-12-17 ENCOUNTER — Encounter: Payer: Self-pay | Admitting: *Deleted

## 2015-12-24 ENCOUNTER — Other Ambulatory Visit: Payer: Self-pay | Admitting: Family Medicine

## 2016-01-16 ENCOUNTER — Other Ambulatory Visit: Payer: Self-pay | Admitting: Family Medicine

## 2016-01-16 ENCOUNTER — Other Ambulatory Visit: Payer: Self-pay | Admitting: Cardiovascular Disease

## 2016-01-16 MED ORDER — TRAZODONE HCL 100 MG PO TABS: 100.0000 mg | ORAL_TABLET | Freq: Every day | ORAL | Status: DC

## 2016-01-16 NOTE — Telephone Encounter (Signed)
Rx(s) sent to pharmacy electronically.  

## 2016-01-29 ENCOUNTER — Other Ambulatory Visit: Payer: Self-pay | Admitting: Family Medicine

## 2016-01-31 NOTE — Telephone Encounter (Signed)
2nd request.  Briley Sulton L, RN  

## 2016-01-31 NOTE — Telephone Encounter (Signed)
Patient calling regarding alprazolam refill. Would like a call once completed.

## 2016-02-03 NOTE — Telephone Encounter (Signed)
Patient notified

## 2016-02-12 ENCOUNTER — Other Ambulatory Visit: Payer: Self-pay | Admitting: Family Medicine

## 2016-02-19 ENCOUNTER — Other Ambulatory Visit: Payer: Self-pay | Admitting: *Deleted

## 2016-02-19 DIAGNOSIS — K219 Gastro-esophageal reflux disease without esophagitis: Secondary | ICD-10-CM

## 2016-02-19 MED ORDER — OMEPRAZOLE 20 MG PO CPDR: 20.0000 mg | DELAYED_RELEASE_CAPSULE | Freq: Every day | ORAL | 3 refills | Status: DC

## 2016-02-19 NOTE — Telephone Encounter (Signed)
Refill request for omeprazole

## 2016-02-28 ENCOUNTER — Other Ambulatory Visit: Payer: Self-pay | Admitting: Family Medicine

## 2016-02-28 DIAGNOSIS — Z1231 Encounter for screening mammogram for malignant neoplasm of breast: Secondary | ICD-10-CM

## 2016-03-25 ENCOUNTER — Other Ambulatory Visit: Payer: Self-pay | Admitting: *Deleted

## 2016-03-25 DIAGNOSIS — G609 Hereditary and idiopathic neuropathy, unspecified: Secondary | ICD-10-CM

## 2016-03-25 MED ORDER — GABAPENTIN 100 MG PO CAPS: 100.0000 mg | ORAL_CAPSULE | Freq: Three times a day (TID) | ORAL | 0 refills | Status: DC

## 2016-04-01 ENCOUNTER — Other Ambulatory Visit: Payer: Self-pay | Admitting: *Deleted

## 2016-04-01 DIAGNOSIS — G609 Hereditary and idiopathic neuropathy, unspecified: Secondary | ICD-10-CM

## 2016-04-01 MED ORDER — GABAPENTIN 100 MG PO CAPS: 100.0000 mg | ORAL_CAPSULE | Freq: Three times a day (TID) | ORAL | 3 refills | Status: DC

## 2016-04-01 NOTE — Telephone Encounter (Signed)
Refill request for 90 day supply.  Celica Kotowski L, RN  

## 2016-04-02 ENCOUNTER — Other Ambulatory Visit: Payer: Self-pay

## 2016-04-02 MED ORDER — HYDRALAZINE HCL 25 MG PO TABS
12.5000 mg | ORAL_TABLET | Freq: Three times a day (TID) | ORAL | 0 refills | Status: DC
Start: 2016-04-02 — End: 2016-11-11

## 2016-04-08 ENCOUNTER — Encounter: Payer: Self-pay | Admitting: Family Medicine

## 2016-04-08 ENCOUNTER — Ambulatory Visit (INDEPENDENT_AMBULATORY_CARE_PROVIDER_SITE_OTHER): Payer: PPO | Admitting: Family Medicine

## 2016-04-08 ENCOUNTER — Ambulatory Visit
Admission: RE | Admit: 2016-04-08 | Discharge: 2016-04-08 | Disposition: A | Discharge status: Home / Self Care | Payer: PPO | Source: Ambulatory Visit | Attending: Family Medicine | Admitting: Family Medicine

## 2016-04-08 VITALS — BP 140/61 | HR 68 | Temp 98.0°F | Ht 61.0 in | Wt 136.8 lb

## 2016-04-08 DIAGNOSIS — E7800 Pure hypercholesterolemia, unspecified: Secondary | ICD-10-CM

## 2016-04-08 DIAGNOSIS — Z0000 Encounter for general adult medical examination without abnormal findings: Secondary | ICD-10-CM

## 2016-04-08 DIAGNOSIS — I10 Essential (primary) hypertension: Secondary | ICD-10-CM | POA: Diagnosis not present

## 2016-04-08 DIAGNOSIS — I480 Paroxysmal atrial fibrillation: Secondary | ICD-10-CM

## 2016-04-08 DIAGNOSIS — Z23 Encounter for immunization: Secondary | ICD-10-CM

## 2016-04-08 DIAGNOSIS — I5032 Chronic diastolic (congestive) heart failure: Secondary | ICD-10-CM

## 2016-04-08 DIAGNOSIS — I495 Sick sinus syndrome: Secondary | ICD-10-CM

## 2016-04-08 DIAGNOSIS — D500 Iron deficiency anemia secondary to blood loss (chronic): Secondary | ICD-10-CM | POA: Diagnosis not present

## 2016-04-08 DIAGNOSIS — Z1231 Encounter for screening mammogram for malignant neoplasm of breast: Secondary | ICD-10-CM

## 2016-04-08 DIAGNOSIS — I2510 Atherosclerotic heart disease of native coronary artery without angina pectoris: Secondary | ICD-10-CM

## 2016-04-08 LAB — CBC
HEMATOCRIT: 34.1 % — AB (ref 35.0–45.0)
HEMOGLOBIN: 11.1 g/dL — AB (ref 11.7–15.5)
MCH: 30.3 pg (ref 27.0–33.0)
MCHC: 32.6 g/dL (ref 32.0–36.0)
MCV: 93.2 fL (ref 80.0–100.0)
MPV: 11.4 fL (ref 7.5–12.5)
Platelets: 189 10*3/uL (ref 140–400)
RBC: 3.66 MIL/uL — ABNORMAL LOW (ref 3.80–5.10)
RDW: 14 % (ref 11.0–15.0)
WBC: 4.7 10*3/uL (ref 3.8–10.8)

## 2016-04-08 LAB — COMPLETE METABOLIC PANEL WITH GFR
ALBUMIN: 4.4 g/dL (ref 3.6–5.1)
ALK PHOS: 61 U/L (ref 33–130)
ALT: 15 U/L (ref 6–29)
AST: 21 U/L (ref 10–35)
BILIRUBIN TOTAL: 0.4 mg/dL (ref 0.2–1.2)
BUN: 23 mg/dL (ref 7–25)
CO2: 28 mmol/L (ref 20–31)
Calcium: 9.7 mg/dL (ref 8.6–10.4)
Chloride: 104 mmol/L (ref 98–110)
Creat: 1.48 mg/dL — ABNORMAL HIGH (ref 0.60–0.88)
GFR, EST AFRICAN AMERICAN: 37 mL/min — AB (ref >=60)
GFR, EST NON AFRICAN AMERICAN: 33 mL/min — AB (ref >=60)
Glucose, Bld: 83 mg/dL (ref 65–99)
POTASSIUM: 4.3 mmol/L (ref 3.5–5.3)
Sodium: 140 mmol/L (ref 135–146)
TOTAL PROTEIN: 6.7 g/dL (ref 6.1–8.1)

## 2016-04-08 LAB — LIPID PANEL
CHOL/HDL RATIO: 2.8 ratio (ref <=5.0)
Cholesterol: 149 mg/dL (ref 125–200)
HDL: 53 mg/dL (ref >=46)
LDL CALC: 70 mg/dL (ref <130)
TRIGLYCERIDES: 130 mg/dL (ref <150)
VLDL: 26 mg/dL (ref <30)

## 2016-04-08 NOTE — Patient Instructions (Addendum)
The older get the less screening tests we do.  You don't need any more pap smears or colonoscopies unless you have problems.   I think it is reasonable because you are so healthy to keep having mammograms for a while.  It would be OK to stretch them out to every other year.   You look marvelous See me in 6 months Neosporin and a bandaid for the skin tag I froze off.

## 2016-04-09 ENCOUNTER — Telehealth: Payer: Self-pay | Admitting: Family Medicine

## 2016-04-09 DIAGNOSIS — H18413 Arcus senilis, bilateral: Secondary | ICD-10-CM | POA: Diagnosis not present

## 2016-04-09 DIAGNOSIS — H02839 Dermatochalasis of unspecified eye, unspecified eyelid: Secondary | ICD-10-CM | POA: Diagnosis not present

## 2016-04-09 DIAGNOSIS — H16223 Keratoconjunctivitis sicca, not specified as Sjogren's, bilateral: Secondary | ICD-10-CM | POA: Diagnosis not present

## 2016-04-09 LAB — FERRITIN: Ferritin: 132 ng/mL (ref 20–288)

## 2016-04-09 NOTE — Assessment & Plan Note (Addendum)
Well controled on current meds 

## 2016-04-09 NOTE — Assessment & Plan Note (Addendum)
Well controled on current meds. No indication for pacer.

## 2016-04-09 NOTE — Assessment & Plan Note (Signed)
Nicely controled.

## 2016-04-09 NOTE — Assessment & Plan Note (Signed)
Well controled. 

## 2016-04-09 NOTE — Telephone Encounter (Signed)
Would like to have yesterdays summary faxed to her insurance [place] at [phone]

## 2016-04-09 NOTE — Progress Notes (Signed)
   Subjective:    Patient ID: Jacqueline [name], female    DOB: May 12, 1931, 82 y.o.   MRN: [id number]  HPI Annual health maint exam for this heatlh 82 yo female.  She feels well and her only concern is a skin lesion below her right breast.  Issues: 1. HX of CAD S/P bypass surg.  Doing well, no chest pain or SOB.  On ASA and statin 2. Skin lesion below right breast. 3. Hx of diastolic dysfunction CHF.  Denies leg swelling or DOE. 4. HX of PAF/tachy brady syndrome.  Very few palpitations.  No syncope or light headedness. On aspirin.   5. HPDP.  Needs flu shot.  Discussed ongoing mammograms.  Given that she is healthy, she will continue every other year mammo.       Review of Systems  Denies cP, SOB, appetite change, bleeding,      Objective:   Physical Exam VS noted HEENT wnl Neck without masses Lungs clear Cardiac RRR without m or g Abd benign Ext no edema.       Assessment & Plan:

## 2016-04-09 NOTE — Assessment & Plan Note (Signed)
Healthy female with well controled chronic problems.

## 2016-04-09 NOTE — Assessment & Plan Note (Signed)
Well controled.  Cont ASA for CVA prevention.

## 2016-04-10 NOTE — Telephone Encounter (Signed)
AVS faxed to [phone]

## 2016-05-11 DIAGNOSIS — M5416 Radiculopathy, lumbar region: Secondary | ICD-10-CM | POA: Diagnosis not present

## 2016-05-11 DIAGNOSIS — M545 Low back pain: Secondary | ICD-10-CM | POA: Diagnosis not present

## 2016-05-11 DIAGNOSIS — Z471 Aftercare following joint replacement surgery: Secondary | ICD-10-CM | POA: Diagnosis not present

## 2016-05-11 DIAGNOSIS — Z96651 Presence of right artificial knee joint: Secondary | ICD-10-CM | POA: Diagnosis not present

## 2016-06-04 DIAGNOSIS — Z961 Presence of intraocular lens: Secondary | ICD-10-CM | POA: Diagnosis not present

## 2016-06-04 DIAGNOSIS — H02839 Dermatochalasis of unspecified eye, unspecified eyelid: Secondary | ICD-10-CM | POA: Diagnosis not present

## 2016-06-04 DIAGNOSIS — H18413 Arcus senilis, bilateral: Secondary | ICD-10-CM | POA: Diagnosis not present

## 2016-06-04 DIAGNOSIS — H16223 Keratoconjunctivitis sicca, not specified as Sjogren's, bilateral: Secondary | ICD-10-CM | POA: Diagnosis not present

## 2016-06-11 DIAGNOSIS — H02839 Dermatochalasis of unspecified eye, unspecified eyelid: Secondary | ICD-10-CM | POA: Diagnosis not present

## 2016-06-11 DIAGNOSIS — Z961 Presence of intraocular lens: Secondary | ICD-10-CM | POA: Diagnosis not present

## 2016-06-11 DIAGNOSIS — H26493 Other secondary cataract, bilateral: Secondary | ICD-10-CM | POA: Diagnosis not present

## 2016-06-11 DIAGNOSIS — H18413 Arcus senilis, bilateral: Secondary | ICD-10-CM | POA: Diagnosis not present

## 2016-06-11 DIAGNOSIS — H16223 Keratoconjunctivitis sicca, not specified as Sjogren's, bilateral: Secondary | ICD-10-CM | POA: Diagnosis not present

## 2016-06-20 ENCOUNTER — Other Ambulatory Visit: Payer: Self-pay | Admitting: Family Medicine

## 2016-06-24 DIAGNOSIS — H26492 Other secondary cataract, left eye: Secondary | ICD-10-CM | POA: Diagnosis not present

## 2016-07-06 ENCOUNTER — Other Ambulatory Visit: Payer: Self-pay | Admitting: Family Medicine

## 2016-07-06 ENCOUNTER — Other Ambulatory Visit: Payer: Self-pay | Admitting: Cardiovascular Disease

## 2016-07-06 NOTE — Telephone Encounter (Signed)
REFILL 

## 2016-08-29 IMAGING — CR DG CHEST 1V PORT
1 series · 1 of 1 positions shown · non-contrast
Comparison: 02/26/2014

CLINICAL DATA: Chest pain

EXAM:
PORTABLE CHEST - 1 VIEW

[AP]
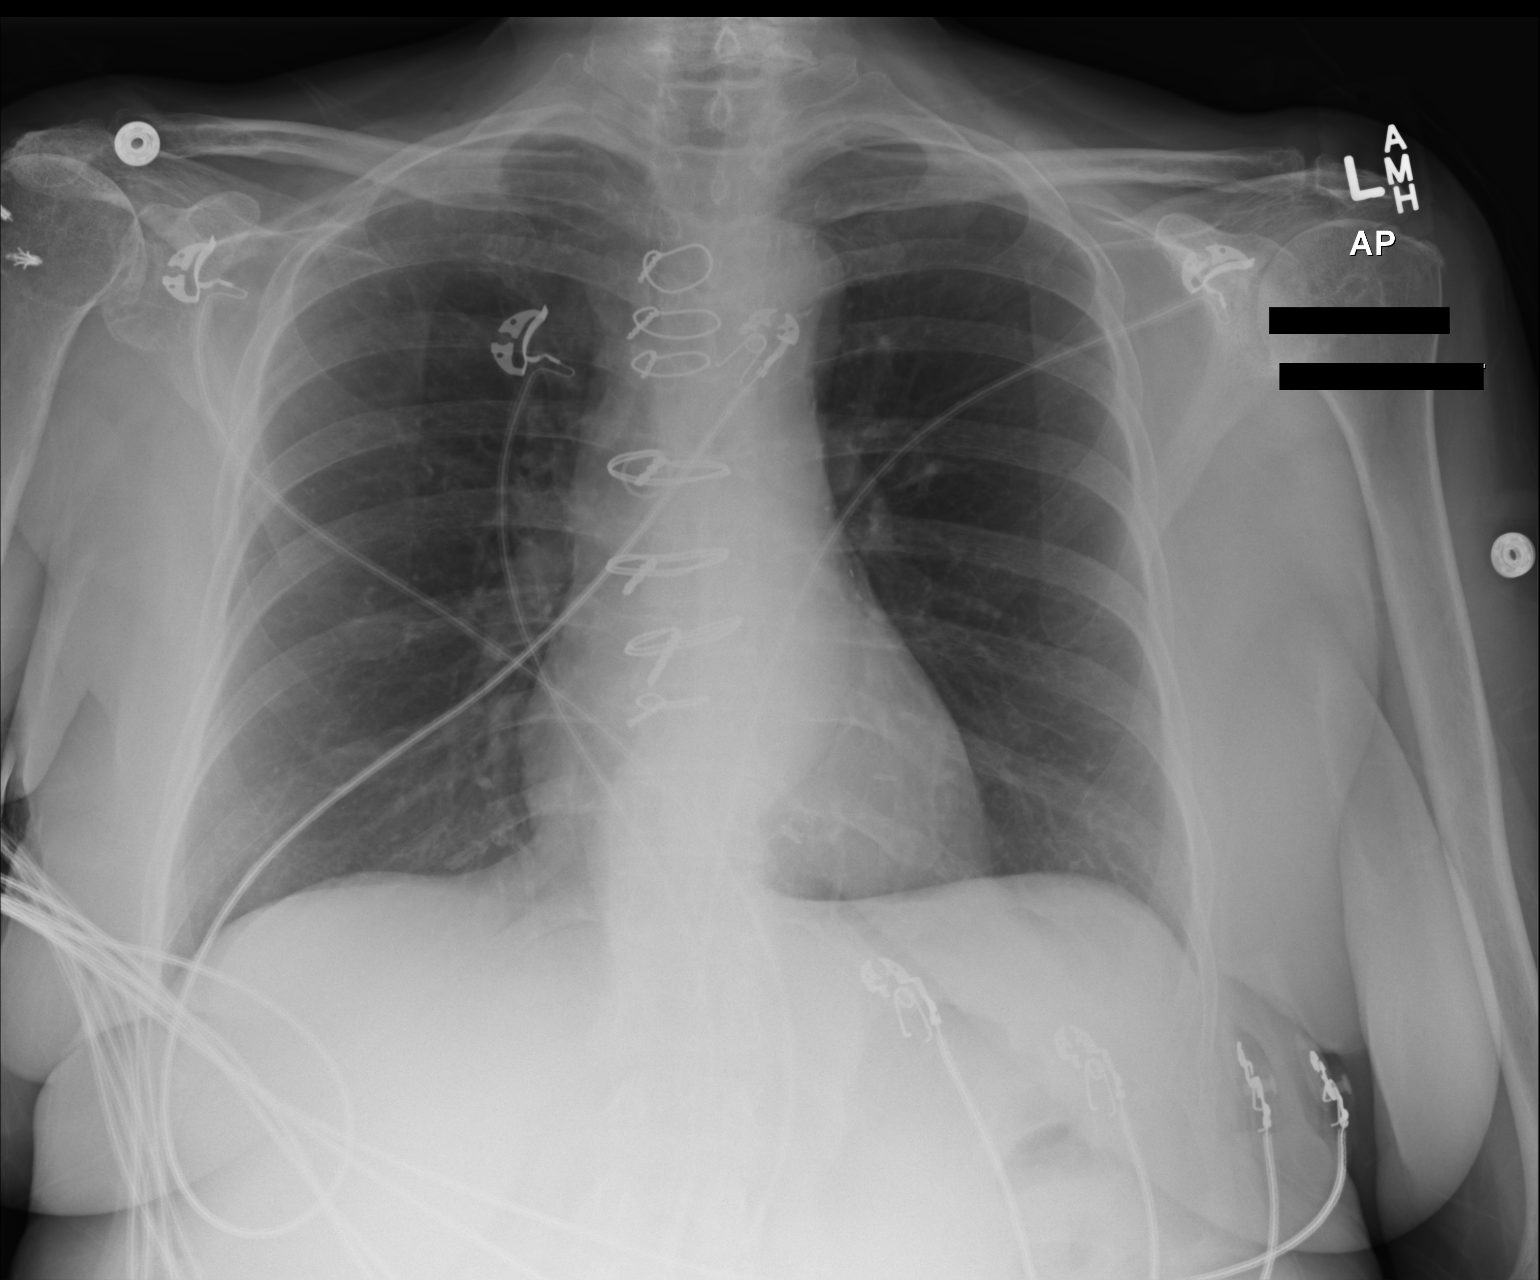

[1 of 1 positions shown; findings below may reference images not displayed]

FINDINGS: Cardiomediastinal silhouette is stable. No acute infiltrate or
pleural effusion. No pulmonary edema. Status post CABG. Again noted
postsurgical changes right humeral head. Mild degenerative changes
bilateral shoulders.
IMPRESSION: No active disease.  No significant change.

## 2016-09-15 ENCOUNTER — Other Ambulatory Visit: Payer: Self-pay | Admitting: Family Medicine

## 2016-09-17 ENCOUNTER — Ambulatory Visit: Payer: PPO | Admitting: Family Medicine

## 2016-10-07 ENCOUNTER — Other Ambulatory Visit: Payer: Self-pay | Admitting: Cardiovascular Disease

## 2016-10-16 ENCOUNTER — Other Ambulatory Visit: Payer: Self-pay | Admitting: Family Medicine

## 2016-11-03 DIAGNOSIS — Z96651 Presence of right artificial knee joint: Secondary | ICD-10-CM | POA: Diagnosis not present

## 2016-11-03 DIAGNOSIS — M25561 Pain in right knee: Secondary | ICD-10-CM | POA: Diagnosis not present

## 2016-11-03 DIAGNOSIS — Z471 Aftercare following joint replacement surgery: Secondary | ICD-10-CM | POA: Diagnosis not present

## 2016-11-04 ENCOUNTER — Other Ambulatory Visit: Payer: Self-pay

## 2016-11-04 MED ORDER — ISOSORBIDE MONONITRATE ER 30 MG PO TB24: 30.0000 mg | ORAL_TABLET | Freq: Every day | ORAL | 0 refills | Status: DC

## 2016-11-11 ENCOUNTER — Other Ambulatory Visit: Payer: Self-pay | Admitting: Cardiovascular Disease

## 2016-11-18 ENCOUNTER — Other Ambulatory Visit: Payer: Self-pay | Admitting: *Deleted

## 2016-11-18 MED ORDER — ISOSORBIDE MONONITRATE ER 30 MG PO TB24: 30.0000 mg | ORAL_TABLET | Freq: Every day | ORAL | 0 refills | Status: DC

## 2016-12-07 ENCOUNTER — Other Ambulatory Visit: Payer: Self-pay | Admitting: Cardiovascular Disease

## 2016-12-08 ENCOUNTER — Other Ambulatory Visit: Payer: Self-pay | Admitting: Cardiovascular Disease

## 2016-12-12 ENCOUNTER — Other Ambulatory Visit: Payer: Self-pay | Admitting: Family Medicine

## 2016-12-14 ENCOUNTER — Other Ambulatory Visit: Payer: Self-pay | Admitting: Cardiovascular Disease

## 2016-12-14 ENCOUNTER — Other Ambulatory Visit: Payer: Self-pay | Admitting: Family Medicine

## 2016-12-14 ENCOUNTER — Telehealth: Payer: Self-pay | Admitting: Cardiovascular Disease

## 2016-12-14 NOTE — Telephone Encounter (Signed)
Rx request sent to pharmacy.  

## 2016-12-14 NOTE — Telephone Encounter (Signed)
New message   Pt is calling.   Pt c/o BP issue: STAT if pt c/o blurred vision, one-sided weakness or slurred speech  1. What are your last 5 BP readings? 148/80 p-73 97/63 p-76  2. Are you having any other symptoms (ex. Dizziness, headache, blurred vision, passed out)? Pt states she feels lightheaded and dizzy. No energy  3. What is your BP issue? Pt is calling about her blood pressure. She states it's going up and down.

## 2016-12-14 NOTE — Telephone Encounter (Signed)
Spoke with the patient. She stated that her blood pressure has been running high and low which has been making her tired. This has been going on for two weeks. She denied chest pain and swelling. Her blood pressure was 148/80. After her medications it was 97/63. She is overdue for her follow up appointment. One has been made for tomorrow 5/22 with Rosaria Ferries, PA.

## 2016-12-15 ENCOUNTER — Ambulatory Visit (INDEPENDENT_AMBULATORY_CARE_PROVIDER_SITE_OTHER): Payer: PPO | Admitting: Physician Assistant

## 2016-12-15 ENCOUNTER — Encounter: Payer: Self-pay | Admitting: Physician Assistant

## 2016-12-15 VITALS — BP 110/66 | HR 72 | Ht 61.0 in | Wt 135.0 lb

## 2016-12-15 DIAGNOSIS — I952 Hypotension due to drugs: Secondary | ICD-10-CM | POA: Diagnosis not present

## 2016-12-15 DIAGNOSIS — I2510 Atherosclerotic heart disease of native coronary artery without angina pectoris: Secondary | ICD-10-CM

## 2016-12-15 DIAGNOSIS — I10 Essential (primary) hypertension: Secondary | ICD-10-CM

## 2016-12-15 DIAGNOSIS — I5032 Chronic diastolic (congestive) heart failure: Secondary | ICD-10-CM

## 2016-12-15 DIAGNOSIS — I480 Paroxysmal atrial fibrillation: Secondary | ICD-10-CM

## 2016-12-15 NOTE — Progress Notes (Signed)
Cardiology Office Note   Date:  12/15/2016   ID:  Jacqueline [name], DOB April 02, 1931, MRN [id number]  PCP:  Jacqueline Resides, MD  Cardiologist:  Dr Jacqueline [name] 08/28/2015  Jacqueline Ferries, PA-C   Chief Complaint  Patient presents with  . Follow-up    pt c/o BP fluctuation, SOB, dizziness and weakness--pt states she feels like her legs just want to buckle    History of Present Illness: Jacqueline [name] is a 83 y.o. female with a history of CABG 2006 w/ LIMA-LAD & SVG-Diag, PAF on Eliquis, EF 55% 2016 echo, D-CHF, HTN, CKD III, HLD, CHADS2VASC=6 (age x 2, female, CAD, HTN, CHF), LIMA-LAD atretic w/ SVG-Diag ok at cath 2016>med rx  05/21 phone notes regarding BP high/low 148/80>>97/63 w/ sx  Jacqueline [name] presents for cardiology evaluation.  She is able to do her housework, including vacuuming and mopping, but has to take rest breaks. No recent change in her stamina. No LE edema, no PND or orthopnea. No weight change. No palpitations.   She has a feeling of being lightheaded and some dizziness every day after she takes her blood pressure medications. The feelings will gradually resolve. She has not felt like she was in danger of falling or passing out but she does not like the feeling.  She has not had chest pain since Trump was elected. No unusual stressors. No other ongoing or acute medical issues. She feels her general health is good.   Past Medical History:  Diagnosis Date  . Arthritis    back  . Atrial fibrillation with rapid ventricular response ([place]) 02/2014   a. CHA2DS2VASc = 6 -> on eliquis;  b. 02/2014 s/p DCCV;  c. 08/2014 Echo: EF 55-60%, Gr 2 DD, mild MR, triv AI.  Marland Kitchen CAD (coronary artery disease)    a. s/p CABG x 2 (LIMA->LAD, VG->Diag);  b. 08/2014 Cath: LM nl, LAD 90p, LCX nl, RCA nl/dominant, LIMA->LAD atretic, VG->D2 patent w/ retrograde filling of LAD, EF 55-60%-->Med Rx.  . Diverticulitis 02/21/2012  . GERD (gastroesophageal reflux disease)   . Hyperlipemia   .  Hypertension   . Insomnia     Past Surgical History:  Procedure Laterality Date  . ABDOMINAL HYSTERECTOMY  1975  . CARDIAC CATHETERIZATION N/A 05/09/2015   Procedure: Right Heart Cath;  Surgeon: Larey Dresser, MD;  Location: [place] CV LAB;  Service: Cardiovascular;  Laterality: N/A;  . CARDIOVERSION N/A 02/28/2014   Procedure: CARDIOVERSION;  Surgeon: Sanda Klein, MD;  Location: [place] ENDOSCOPY;  Service: Cardiovascular;  Laterality: N/A;  . CATARACT EXTRACTION Bilateral 2014   with lens implanted  . COLONOSCOPY N/A 02/19/2015   Procedure: COLONOSCOPY;  Surgeon: Ladene Artist, MD;  Location: [facility] ENDOSCOPY;  Service: Endoscopy;  Laterality: N/A;  . CORONARY ARTERY BYPASS GRAFT  2006   off-pump bypass surgery with LIMA to the LAD and SVG to the second diagonal artery ( performed by Dr Servando Snare)   . JOINT REPLACEMENT Bilateral    L-2004, R-2006  . LEFT HEART CATHETERIZATION WITH CORONARY/GRAFT ANGIOGRAM N/A 09/17/2014   Procedure: LEFT HEART CATHETERIZATION WITH Beatrix Fetters;  Surgeon: Troy Sine, MD;  Location: [facility] CATH LAB;  Service: Cardiovascular;  Laterality: N/A;  . REIMPLANTATION OF TOTAL KNEE Right 10/08/2014   Procedure: REIMPLANTATION OF RIGHT TOTAL KNEE ARTHROPLASTY WITH REMOVAL OF ANTIBIOTIC SPACER;  Surgeon: Paralee Cancel, MD;  Location: [facility] ORS;  Service: Orthopedics;  Laterality: Right;  . ROTATOR CUFF REPAIR Bilateral    r-1999, l-  2005  . TEE WITHOUT CARDIOVERSION N/A 02/28/2014   Procedure: TRANSESOPHAGEAL ECHOCARDIOGRAM (TEE);  Surgeon: Sanda Klein, MD;  Location: [place];  Service: Cardiovascular;  Laterality: N/A;  . TOTAL KNEE ARTHROPLASTY Right 07/02/2014   Procedure: Resection of Infected Right Total Knee Arthroplasty with placement antibiotic spacer;  Surgeon: Mauri Pole, MD;  Location: [facility] ORS;  Service: Orthopedics;  Laterality: Right;    Current Outpatient Prescriptions  Medication Sig Dispense Refill  . acetaminophen (TYLENOL) 500 MG  tablet Take 500-1,000 mg by mouth every 6 (six) hours as needed for moderate pain or headache.    . ALPRAZolam (XANAX) 0.5 MG tablet TAKE 1 TABLET BY MOUTH TWICE A DAY AS NEEDED FOR ANXIETY 60 tablet 5  . aspirin EC 81 MG tablet Take 81 mg by mouth daily.    . cycloSPORINE (RESTASIS) 0.05 % ophthalmic emulsion 1 drop 2 (two) times daily. Dose per ophtho    . diphenhydrAMINE (BENADRYL) 25 MG tablet Take 50 mg by mouth 2 (two) times daily as needed for allergies (eye allergies).     . furosemide (LASIX) 20 MG tablet TAKE 1 TABLET EVERY DAY 90 tablet 3  . gabapentin (NEURONTIN) 100 MG capsule Take 1 capsule (100 mg total) by mouth 3 (three) times daily. 270 capsule 3  . hydrALAZINE (APRESOLINE) 25 MG tablet Take 0.5 tablets (12.5 mg total) by mouth 3 (three) times daily. Please make appt for refills. 90 tablet 0  . Iron-Vitamins (GERITOL COMPLETE PO) Take 1 tablet by mouth daily.    . isosorbide mononitrate (IMDUR) 30 MG 24 hr tablet TAKE 1 TABLET (30 MG TOTAL) BY MOUTH DAILY. NEEDS APPOINTMENT FOR FUTURE REFILLS OR 90-DAY SUPPLY 30 tablet 6  . NITROSTAT 0.4 MG SL tablet PLACE 1 TABLET (0.4 MG TOTAL) UNDER THE TONGUE EVERY 5 (FIVE) MINUTES AS NEEDED FOR CHEST PAIN. 25 tablet 1  . Omega-3 Fatty Acids (FISH OIL) 1000 MG CAPS Take 1-2 capsules by mouth 2 (two) times daily. Takes one in the morning and two capsules at night.    Marland Kitchen omeprazole (PRILOSEC) 20 MG capsule Take 1 capsule (20 mg total) by mouth daily. 90 capsule 3  . polyethylene glycol powder (GLYCOLAX/MIRALAX) powder MIX 17 G AND TAKE BY MOUTH DAILY. 527 g 3  . Probiotic Product (PROBIOTIC PO) Take 1 capsule by mouth daily. Reported on 10/31/2015    . rOPINIRole (REQUIP) 0.25 MG tablet TAKE 1 TABLET BY MOUTH 3 TIMES A DAY 270 tablet 3  . simvastatin (ZOCOR) 40 MG tablet TAKE 1/2 A TABLET BY MOUTH AT BEDTIME. 45 tablet 3  . traMADol (ULTRAM) 50 MG tablet TAKE 1 TABLET BY MOUTH TWICE A DAY AS NEEDED FOR PAIN 60 tablet 5  . traZODone (DESYREL) 100 MG  tablet TAKE 1 TABLET (100 MG TOTAL) BY MOUTH AT BEDTIME. 90 tablet 3   No current facility-administered medications for this visit.     Allergies:   Atorvastatin; Crestor [rosuvastatin]; Morphine and related; and Prednisone    Social History:  The patient  reports that she has never smoked. She has never used smokeless tobacco. She reports that she does not drink alcohol or use drugs.   Family History:  The patient's family history includes Parkinson's disease in her mother.    ROS:  Please see the history of present illness. All other systems are reviewed and negative.    PHYSICAL EXAM: VS:  BP 110/66 (BP Location: Left Arm, Patient Position: Sitting, Cuff Size: Normal) Comment: pt states feeling dizzy and  weak now  Pulse 72   Ht 5\' 1"  (1.549 m)   Wt 135 lb (61.2 kg)   BMI 25.51 kg/m  , BMI Body mass index is 25.51 kg/m. GEN: Well nourished, well developed, female in no acute distress  HEENT: normal for age  Neck: no JVD, no carotid bruit, no masses Cardiac: RRR; no murmur, no rubs, or gallops Respiratory:  clear to auscultation bilaterally, normal work of breathing GI: soft, nontender, nondistended, + BS MS: no deformity or atrophy; no edema; distal pulses are 2+ in all 4 extremities   Skin: warm and dry, no rash Neuro:  Strength and sensation are intact Psych: euthymic mood, full affect   EKG:  EKG is ordered today. The ekg ordered today demonstrates sinus rhythm, occasional PACs, no acute ischemic changes, no Q waves, normal intervals. Morphology changes noted in lead 3 and aVL, significance unclear.   Recent Labs: 04/08/2016: ALT 15; BUN 23; Creat 1.48; Hemoglobin 11.1; Platelets 189; Potassium 4.3; Sodium 140    Lipid Panel    Component Value Date/Time   CHOL 149 04/08/2016 1153   TRIG 130 04/08/2016 1153   HDL 53 04/08/2016 1153   CHOLHDL 2.8 04/08/2016 1153   VLDL 26 04/08/2016 1153   LDLCALC 70 04/08/2016 1153     Wt Readings from Last 3 Encounters:    12/15/16 135 lb (61.2 kg)  04/08/16 136 lb 12.8 oz (62.1 kg)  12/12/15 133 lb 1.6 oz (60.4 kg)     Other studies Reviewed: Additional studies/ records that were reviewed today include: Office notes, hospital records and testing.  ASSESSMENT AND PLAN:  1.  Hypotension: Ms. Leclere does not tolerate a low-normal blood pressure. I will stop her hydralazine. She understands it is okay to take a half tablet as needed for SBP greater than or equal to 180. She will follow up with myself or the pharmacist in a couple of weeks to make sure that stopping the hydralazine eliminates her dizziness. She may need additional blood pressure control, amlodipine might be a good choice for her. I advised her that it was okay to take a half a Xanax if her blood pressure was a little high. Told her that anxiety could make her blood pressure worse, so she needed to make sure she was calm about things.  2. CAD: She is on good medical therapy with aspirin, Imdur, and a statin. She was weaned off pindolol by Dr. Sallyanne [name] because of her problems with hypotension.  3. Chronic diastolic CHF: She had severe pulmonary artery hypertension at the time of her right heart cath in October 2016. She is euvolemic by exam and her weight is stable. Continue Lasix 20 mg daily.  4. PAF: She is in sinus rhythm today and has not had any palpitations. She is slightly lightheaded today here in the office, and is in sinus rhythm so I do not believe atrial fib is contributing to her symptoms. She is not anticoagulated due to history of GI bleed. CHADVasc score 6 (age 82, CHF, CAD, HTN, gender).   5. Hypertension: See above   Current medicines are reviewed at length with the patient today.  The patient has concerns regarding medicines. Concerns were addressed  The following changes have been made:  Stop hydralazine  Labs/ tests ordered today include:  No orders of the defined types were placed in this encounter.    Disposition:   FU  with Dr. Sallyanne [name] in 3 months, blood pressure management visit 2 weeks  Signed,  Lenoard Aden  12/15/2016 9:26 AM    [place] Phone: [phone]; Fax: [phone]  This note was written with the assistance of speech recognition software. Please excuse any transcriptional errors.

## 2016-12-15 NOTE — Patient Instructions (Addendum)
Medication Instructions:  STOP YOUR HYDRALZINE IT IS OK TO TAKE 1/2 TABLET WHEN YOUR SYSTOLIC (TOP NUMBER) IS EQUAL TO OR GREATER THAN 180  Labwork: NONE  Testing/Procedures: NONE  Follow-Up: Your physician recommends that you schedule a follow-up appointment in: 2 WEEKS WITH PHARM D OR RHONDA B PA FOR BLOOD PRESSURE  Your physician recommends that you schedule a follow-up appointment in: 3 MONTH OV WITH DR [id number]   Any Other Special Instructions Will Be Listed Below (If Applicable). IF YOU ARE FEELING ANXIOUS ABOUT YOUR BLOOD PRESSURE TAKE 1/2 OF A XANAX (ALPRAZOLAM)  INCREASE YOUR ACTIVITY, INCREASE WALKING WHEN YOU GO TO THE STORE   If you need a refill on your cardiac medications before your next appointment, please call your pharmacy.

## 2016-12-17 ENCOUNTER — Telehealth: Payer: Self-pay | Admitting: Family Medicine

## 2016-12-17 NOTE — Telephone Encounter (Signed)
Asked pt if she would like to schedule an AVW. She declined offer, let me know her ophthalmologist is Dr. Carlos Levering. [place], and states all of her doctors are updated on her health. Let her know to call if she changes her mind. - Mesha Guinyard

## 2016-12-30 ENCOUNTER — Ambulatory Visit (INDEPENDENT_AMBULATORY_CARE_PROVIDER_SITE_OTHER): Payer: PPO | Admitting: Pharmacist Clinician (PhC)/ Clinical Pharmacy Specialist

## 2016-12-30 ENCOUNTER — Encounter: Payer: Self-pay | Admitting: Pharmacist Clinician (PhC)/ Clinical Pharmacy Specialist

## 2016-12-30 DIAGNOSIS — I10 Essential (primary) hypertension: Secondary | ICD-10-CM | POA: Diagnosis not present

## 2016-12-30 NOTE — Assessment & Plan Note (Signed)
Patient with history of hypertension, now with hypotensive symptoms and low BP readings.  She is currently not on any significant blood pressure lowering medications (just isosorbide and furosemide), however the dizziness/lightheadedness persists most days.  I suspect this may be due to her ropinirole and have suggested she have a conversation with her PCP about this.  She states she is going to taper the medication back on her own to see if any improvement before calling PCP.  Gave her the name pramipexole to discuss with PCP.  While it can cause hypotension just as much as ropinirole, it does have somewhat less dizziness associated with it.  It may not give her any benefit, but she can review the possibility with her MD.

## 2016-12-30 NOTE — Progress Notes (Signed)
12/30/2016 CESILIA [name] 1931/03/26 [id number]   HPI:  Jacqueline [name] is a 83 y.o. female patient of Dr [name], with a PMH below who presents today for hypertension clinic evaluation.  She notes problems with dizziness/lightheadedness after taking her meds, states the feelings resolve gradually over time, happens daily.  She denies any syncopal events.  When she saw Rosaria Ferries in May her hydralazine 12.5 mg bid was discontinued, as it was assumed to be a side effect of low blood pressure or the hydralazine itself.  However today she notes that there has been no change in these symptoms.  Her home BP readings are still fairly low, and do occasionally drop to < 094 systolic, but mostly stay between about 110-130.       Her medical history is significant for chronic back pain/arthritis, hyperlipidemia, CAD s/p CABG, CHF class II, CKD stage 2 and hypertension.  She also has restless leg syndrome for which she take ropinirole 0.25 mg tid.  This medication, while good for RLS, has a very high incidence of both hypotension (up to 25%) and dizziness (up to 40%).  She states she has taken this for years, but she is now 38, so it is quite plausible that this is the cause of her problems.  Blood Pressure Goal:  140/90     150/90   Current Medications:  Furosemide 20 mg qd   Isosorbide mono 30 mg qd  Family Hx:  15 brother (mother's side) died from MI at 73, youngest son with hypertension (he's 64)   Social Hx:  No tobacco or alcohol, 1/2 cup coffee daily  Diet:  Mostly home cooked, does add small amts of salt to her food  Exercise:  Wal[place] as able[place] walk at home (lack of sidewalks), she has back problems that limit her walking time in stores to just getting what she needs, not able to do "laps" in stores before shopping   Home BP readings:  Home cuff about 4 yrs, wrist, [employer] brand;  709/62 yesterday systolic drpped to 83/66 later in the day.   Did not bring cuff of list of home readings.    Intolerances:   Atorvastatin and rosuvastatin caused myalgias  Wt Readings from Last 3 Encounters:  12/15/16 135 lb (61.2 kg)  04/08/16 136 lb 12.8 oz (62.1 kg)  12/12/15 133 lb 1.6 oz (60.4 kg)   BP Readings from Last 3 Encounters:  12/30/16 126/76  12/15/16 110/66  04/08/16 140/61   Pulse Readings from Last 3 Encounters:  12/30/16 72  12/15/16 72  04/08/16 68    Current Outpatient Prescriptions  Medication Sig Dispense Refill  . acetaminophen (TYLENOL) 500 MG tablet Take 500-1,000 mg by mouth every 6 (six) hours as needed for moderate pain or headache.    . ALPRAZolam (XANAX) 0.5 MG tablet TAKE 1 TABLET BY MOUTH TWICE A DAY AS NEEDED FOR ANXIETY 60 tablet 5  . aspirin EC 81 MG tablet Take 81 mg by mouth daily.    . cycloSPORINE (RESTASIS) 0.05 % ophthalmic emulsion 1 drop 2 (two) times daily. Dose per ophtho    . diphenhydrAMINE (BENADRYL) 25 MG tablet Take 50 mg by mouth 2 (two) times daily as needed for allergies (eye allergies).     . furosemide (LASIX) 20 MG tablet TAKE 1 TABLET EVERY DAY 90 tablet 3  . gabapentin (NEURONTIN) 100 MG capsule Take 1 capsule (100 mg total) by mouth 3 (  three) times daily. 270 capsule 3  . hydrALAZINE (APRESOLINE) 25 MG tablet Take 25 mg by mouth as directed. TAKE 1/2 TABLET BY MOUTH AS NEEDED FOR BLOOD PRESSURE EQUAL TO OR GREATER THAN 180    . Iron-Vitamins (GERITOL COMPLETE PO) Take 1 tablet by mouth daily.    . isosorbide mononitrate (IMDUR) 30 MG 24 hr tablet TAKE 1 TABLET (30 MG TOTAL) BY MOUTH DAILY. NEEDS APPOINTMENT FOR FUTURE REFILLS OR 90-DAY SUPPLY 30 tablet 6  . NITROSTAT 0.4 MG SL tablet PLACE 1 TABLET (0.4 MG TOTAL) UNDER THE TONGUE EVERY 5 (FIVE) MINUTES AS NEEDED FOR CHEST PAIN. 25 tablet 1  . Omega-3 Fatty Acids (FISH OIL) 1000 MG CAPS Take 1-2 capsules by mouth 2 (two) times daily. Takes one in the morning and two capsules at night.    Marland Kitchen omeprazole (PRILOSEC) 20 MG capsule Take 1  capsule (20 mg total) by mouth daily. 90 capsule 3  . polyethylene glycol powder (GLYCOLAX/MIRALAX) powder MIX 17 G AND TAKE BY MOUTH DAILY. 527 g 3  . Probiotic Product (PROBIOTIC PO) Take 1 capsule by mouth daily. Reported on 10/31/2015    . rOPINIRole (REQUIP) 0.25 MG tablet TAKE 1 TABLET BY MOUTH 3 TIMES A DAY 270 tablet 3  . simvastatin (ZOCOR) 40 MG tablet TAKE 1/2 A TABLET BY MOUTH AT BEDTIME. 45 tablet 3  . traMADol (ULTRAM) 50 MG tablet TAKE 1 TABLET BY MOUTH TWICE A DAY AS NEEDED FOR PAIN 60 tablet 5  . traZODone (DESYREL) 100 MG tablet TAKE 1 TABLET (100 MG TOTAL) BY MOUTH AT BEDTIME. 90 tablet 3   No current facility-administered medications for this visit.     Allergies  Allergen Reactions  . Atorvastatin Other (See Comments)    Myalgias in legs  . Crestor [Rosuvastatin] Other (See Comments)    Myalgias in legs  . Morphine And Related Other (See Comments)    "Crazy thoughts, severe headache, sick"  . Prednisone     On two occasions has caused A fib    Past Medical History:  Diagnosis Date  . Arthritis    back  . Atrial fibrillation with rapid ventricular response ([place]) 02/2014   a. CHA2DS2VASc = 6 -> on eliquis;  b. 02/2014 s/p DCCV;  c. 08/2014 Echo: EF 55-60%, Gr 2 DD, mild MR, triv AI.  Marland Kitchen CAD (coronary artery disease)    a. s/p CABG x 2 (LIMA->LAD, VG->Diag);  b. 08/2014 Cath: LM nl, LAD 90p, LCX nl, RCA nl/dominant, LIMA->LAD atretic, VG->D2 patent w/ retrograde filling of LAD, EF 55-60%-->Med Rx.  . Diverticulitis 02/21/2012  . GERD (gastroesophageal reflux disease)   . Hyperlipemia   . Hypertension   . Insomnia     Blood pressure 126/76, pulse 72.126/76  72  122/76 Standing  HYPERTENSION, BENIGN SYSTEMIC Patient with history of hypertension, now with hypotensive symptoms and low BP readings.  She is currently not on any significant blood pressure lowering medications (just isosorbide and furosemide), however the dizziness/lightheadedness persists most days.  I  suspect this may be due to her ropinirole and have suggested she have a conversation with her PCP about this.  She states she is going to taper the medication back on her own to see if any improvement before calling PCP.  Gave her the name pramipexole to discuss with PCP.  While it can cause hypotension just as much as ropinirole, it does have somewhat less dizziness associated with it.  It may not give her any benefit, but she  can review the possibility with her MD.   Tommy Medal PharmD CPP [place]

## 2016-12-30 NOTE — Patient Instructions (Addendum)
Call if you notice that you are taking hydralazine more than 3 times a week  Try decreasing your ropinirole as this may be the cause of your dizziness.  If you feel better with less medication, talk to your primary MD about trying pramipexole.  It works the same way, but maybe will cause you less dizziness.  Your blood pressure today is 126/76  (goal is < 130/80)  Check your blood pressure at home several times each week  and keep record of the readings.  Take your BP meds as follows:  Take hydralazine 12.5 mg (1/2 tablet) for systolic blood pressure (top number) > 160  Bring all of your meds, your BP cuff and your record of home blood pressures to your next appointment.  Exercise as you're able, try to walk approximately 30 minutes per day.  Keep salt intake to a minimum, especially watch canned and prepared boxed foods.  Eat more fresh fruits and vegetables and fewer canned items.  Avoid eating in fast food restaurants.    HOW TO TAKE YOUR BLOOD PRESSURE: . Rest 5 minutes before taking your blood pressure. .  Don't smoke or drink caffeinated beverages for at least 30 minutes before. . Take your blood pressure before (not after) you eat. . Sit comfortably with your back supported and both feet on the floor (don't cross your legs). . Elevate your arm to heart level on a table or a desk. . Use the proper sized cuff. It should fit smoothly and snugly around your bare upper arm. There should be enough room to slip a fingertip under the cuff. The bottom edge of the cuff should be 1 inch above the crease of the elbow. . Ideally, take 3 measurements at one sitting and record the average.

## 2016-12-31 NOTE — Progress Notes (Signed)
Thank you MCr 

## 2017-01-01 ENCOUNTER — Other Ambulatory Visit: Payer: Self-pay | Admitting: Cardiovascular Disease

## 2017-01-10 ENCOUNTER — Other Ambulatory Visit: Payer: Self-pay | Admitting: Cardiovascular Disease

## 2017-01-18 ENCOUNTER — Telehealth: Payer: Self-pay | Admitting: Cardiovascular Disease

## 2017-01-18 NOTE — Telephone Encounter (Signed)
Spoke with patient - her home BP readings have been doing well, but do occasionally drop to < 252 systolic.  She reports no adverse events from this.  Wanted to know if she should stop taking isosorbide because of low pressures.  Explained that the isosorbide should not be significantly changing her BP, so she should continue to take.  She did note that she has cut her ropinirole from tid to qd, as I suspect that may be the cause of some of her dizziness.    Advised she continue with occasional monitoring, and if she is not feeling symptomatic with systolic pressures in the 90's then she should not worry.

## 2017-01-18 NOTE — Telephone Encounter (Signed)
New message    Pt is calling.  Pt c/o BP issue: STAT if pt c/o blurred vision, one-sided weakness or slurred speech  1. What are your last 5 BP readings? 118/77  2. Are you having any other symptoms (ex. Dizziness, headache, blurred vision, passed out)? No   3. What is your BP issue? Pt is calling to see if she should take her meds if her bp is low.

## 2017-01-18 NOTE — Telephone Encounter (Signed)
S/w pt states that her "BP is low" and staes that she is to call Cyril Mourning when this happens. Informed pt that this is a normal BP and she states to please notify Cyril Mourning. Pt is asking if she should take her regular medications, informed pt to take her daily medications but not the PRN medication, hydralazine. Will forward to [employer]

## 2017-02-05 ENCOUNTER — Other Ambulatory Visit: Payer: Self-pay | Admitting: Family Medicine

## 2017-02-05 DIAGNOSIS — K219 Gastro-esophageal reflux disease without esophagitis: Secondary | ICD-10-CM

## 2017-02-09 ENCOUNTER — Other Ambulatory Visit: Payer: Self-pay | Admitting: Cardiovascular Disease

## 2017-02-26 ENCOUNTER — Other Ambulatory Visit: Payer: Self-pay | Admitting: Family Medicine

## 2017-02-26 DIAGNOSIS — Z1231 Encounter for screening mammogram for malignant neoplasm of breast: Secondary | ICD-10-CM

## 2017-03-08 ENCOUNTER — Inpatient Hospital Stay (HOSPITAL_COMMUNITY)
Admission: EM | Admit: 2017-03-08 | Discharge: 2017-03-11 | DRG: 309 | Disposition: A | Discharge status: Home / Self Care | Payer: PPO | Attending: Internal Medicine | Admitting: Internal Medicine

## 2017-03-08 ENCOUNTER — Telehealth: Payer: Self-pay | Admitting: Cardiovascular Disease

## 2017-03-08 ENCOUNTER — Emergency Department (HOSPITAL_COMMUNITY): Payer: PPO

## 2017-03-08 ENCOUNTER — Encounter (HOSPITAL_COMMUNITY): Payer: Self-pay

## 2017-03-08 DIAGNOSIS — Z961 Presence of intraocular lens: Secondary | ICD-10-CM | POA: Diagnosis present

## 2017-03-08 DIAGNOSIS — Z885 Allergy status to narcotic agent status: Secondary | ICD-10-CM | POA: Diagnosis not present

## 2017-03-08 DIAGNOSIS — I5032 Chronic diastolic (congestive) heart failure: Secondary | ICD-10-CM | POA: Diagnosis present

## 2017-03-08 DIAGNOSIS — N184 Chronic kidney disease, stage 4 (severe): Secondary | ICD-10-CM | POA: Diagnosis present

## 2017-03-08 DIAGNOSIS — R079 Chest pain, unspecified: Secondary | ICD-10-CM | POA: Insufficient documentation

## 2017-03-08 DIAGNOSIS — I2510 Atherosclerotic heart disease of native coronary artery without angina pectoris: Secondary | ICD-10-CM | POA: Diagnosis not present

## 2017-03-08 DIAGNOSIS — Z79899 Other long term (current) drug therapy: Secondary | ICD-10-CM

## 2017-03-08 DIAGNOSIS — Z9841 Cataract extraction status, right eye: Secondary | ICD-10-CM | POA: Diagnosis not present

## 2017-03-08 DIAGNOSIS — I480 Paroxysmal atrial fibrillation: Principal | ICD-10-CM | POA: Diagnosis present

## 2017-03-08 DIAGNOSIS — R000 Tachycardia, unspecified: Secondary | ICD-10-CM | POA: Diagnosis not present

## 2017-03-08 DIAGNOSIS — Z7982 Long term (current) use of aspirin: Secondary | ICD-10-CM | POA: Diagnosis not present

## 2017-03-08 DIAGNOSIS — M810 Age-related osteoporosis without current pathological fracture: Secondary | ICD-10-CM | POA: Diagnosis present

## 2017-03-08 DIAGNOSIS — D638 Anemia in other chronic diseases classified elsewhere: Secondary | ICD-10-CM | POA: Diagnosis present

## 2017-03-08 DIAGNOSIS — Z9842 Cataract extraction status, left eye: Secondary | ICD-10-CM | POA: Diagnosis not present

## 2017-03-08 DIAGNOSIS — E1122 Type 2 diabetes mellitus with diabetic chronic kidney disease: Secondary | ICD-10-CM | POA: Diagnosis not present

## 2017-03-08 DIAGNOSIS — I081 Rheumatic disorders of both mitral and tricuspid valves: Secondary | ICD-10-CM | POA: Diagnosis present

## 2017-03-08 DIAGNOSIS — I4891 Unspecified atrial fibrillation: Secondary | ICD-10-CM | POA: Diagnosis not present

## 2017-03-08 DIAGNOSIS — K219 Gastro-esophageal reflux disease without esophagitis: Secondary | ICD-10-CM | POA: Diagnosis not present

## 2017-03-08 DIAGNOSIS — N183 Chronic kidney disease, stage 3 (moderate): Secondary | ICD-10-CM | POA: Diagnosis not present

## 2017-03-08 DIAGNOSIS — I130 Hypertensive heart and chronic kidney disease with heart failure and stage 1 through stage 4 chronic kidney disease, or unspecified chronic kidney disease: Secondary | ICD-10-CM | POA: Diagnosis present

## 2017-03-08 DIAGNOSIS — Z888 Allergy status to other drugs, medicaments and biological substances status: Secondary | ICD-10-CM

## 2017-03-08 DIAGNOSIS — Z820 Family history of epilepsy and other diseases of the nervous system: Secondary | ICD-10-CM

## 2017-03-08 DIAGNOSIS — F419 Anxiety disorder, unspecified: Secondary | ICD-10-CM | POA: Diagnosis present

## 2017-03-08 DIAGNOSIS — I272 Pulmonary hypertension, unspecified: Secondary | ICD-10-CM | POA: Diagnosis present

## 2017-03-08 DIAGNOSIS — N1832 Chronic kidney disease, stage 3b: Secondary | ICD-10-CM | POA: Diagnosis present

## 2017-03-08 DIAGNOSIS — Z96651 Presence of right artificial knee joint: Secondary | ICD-10-CM | POA: Diagnosis present

## 2017-03-08 DIAGNOSIS — Z90710 Acquired absence of both cervix and uterus: Secondary | ICD-10-CM

## 2017-03-08 DIAGNOSIS — R0789 Other chest pain: Secondary | ICD-10-CM | POA: Diagnosis not present

## 2017-03-08 DIAGNOSIS — E785 Hyperlipidemia, unspecified: Secondary | ICD-10-CM | POA: Diagnosis not present

## 2017-03-08 DIAGNOSIS — Z951 Presence of aortocoronary bypass graft: Secondary | ICD-10-CM | POA: Diagnosis not present

## 2017-03-08 DIAGNOSIS — I43 Cardiomyopathy in diseases classified elsewhere: Secondary | ICD-10-CM | POA: Diagnosis present

## 2017-03-08 DIAGNOSIS — I119 Hypertensive heart disease without heart failure: Secondary | ICD-10-CM | POA: Diagnosis present

## 2017-03-08 LAB — CBC
HCT: 38.7 % (ref 36.0–46.0)
Hemoglobin: 13.1 g/dL (ref 12.0–15.0)
MCH: 30.9 pg (ref 26.0–34.0)
MCHC: 33.9 g/dL (ref 30.0–36.0)
MCV: 91.3 fL (ref 78.0–100.0)
PLATELETS: 177 10*3/uL (ref 150–400)
RBC: 4.24 MIL/uL (ref 3.87–5.11)
RDW: 13.9 % (ref 11.5–15.5)
WBC: 6.3 10*3/uL (ref 4.0–10.5)

## 2017-03-08 LAB — BASIC METABOLIC PANEL
Anion gap: 12 (ref 5–15)
BUN: 19 mg/dL (ref 6–20)
CHLORIDE: 106 mmol/L (ref 101–111)
CO2: 22 mmol/L (ref 22–32)
CREATININE: 1.54 mg/dL — AB (ref 0.44–1.00)
Calcium: 9.5 mg/dL (ref 8.9–10.3)
GFR calc non Af Amer: 30 mL/min — ABNORMAL LOW (ref >60)
GFR, EST AFRICAN AMERICAN: 35 mL/min — AB (ref >60)
Glucose, Bld: 97 mg/dL (ref 65–99)
POTASSIUM: 3.6 mmol/L (ref 3.5–5.1)
SODIUM: 140 mmol/L (ref 135–145)

## 2017-03-08 LAB — HEPARIN LEVEL (UNFRACTIONATED): HEPARIN UNFRACTIONATED: 0.41 [IU]/mL (ref 0.30–0.70)

## 2017-03-08 LAB — BRAIN NATRIURETIC PEPTIDE: B Natriuretic Peptide: 646.4 pg/mL — ABNORMAL HIGH (ref 0.0–100.0)

## 2017-03-08 LAB — TSH: TSH: 3.622 u[IU]/mL (ref 0.350–4.500)

## 2017-03-08 LAB — MAGNESIUM: MAGNESIUM: 2 mg/dL (ref 1.7–2.4)

## 2017-03-08 LAB — MRSA PCR SCREENING: MRSA by PCR: NEGATIVE

## 2017-03-08 LAB — I-STAT TROPONIN, ED: Troponin i, poc: 0 ng/mL (ref 0.00–0.08)

## 2017-03-08 MED ORDER — SIMVASTATIN 40 MG PO TABS
40.0000 mg | ORAL_TABLET | Freq: Every day | ORAL | Status: DC
  Administered 2017-03-08 – 2017-03-10 (×3): 40 mg via ORAL
  Filled 2017-03-08 (×5): qty 1

## 2017-03-08 MED ORDER — DILTIAZEM HCL 100 MG IV SOLR
5.0000 mg/h | INTRAVENOUS | Status: DC
  Administered 2017-03-08 – 2017-03-09 (×2): 5 mg/h via INTRAVENOUS
  Filled 2017-03-08 (×3): qty 100

## 2017-03-08 MED ORDER — ACETAMINOPHEN 325 MG PO TABS: 650.0000 mg | ORAL_TABLET | ORAL | Status: DC | PRN

## 2017-03-08 MED ORDER — ALPRAZOLAM 0.5 MG PO TABS
0.5000 mg | ORAL_TABLET | Freq: Two times a day (BID) | ORAL | Status: DC | PRN
Start: 2017-03-08 — End: 2017-03-11
  Administered 2017-03-08 – 2017-03-10 (×3): 0.5 mg via ORAL
  Filled 2017-03-08 (×3): qty 1

## 2017-03-08 MED ORDER — ISOSORBIDE MONONITRATE ER 30 MG PO TB24
30.0000 mg | ORAL_TABLET | Freq: Every day | ORAL | Status: DC
  Administered 2017-03-09 – 2017-03-11 (×3): 30 mg via ORAL
  Filled 2017-03-08 (×3): qty 1

## 2017-03-08 MED ORDER — TRAZODONE HCL 100 MG PO TABS
100.0000 mg | ORAL_TABLET | Freq: Every day | ORAL | Status: DC
  Administered 2017-03-08 – 2017-03-10 (×3): 100 mg via ORAL
  Filled 2017-03-08 (×3): qty 1

## 2017-03-08 MED ORDER — WARFARIN SODIUM 5 MG PO TABS
5.0000 mg | ORAL_TABLET | Freq: Once | ORAL | Status: AC
  Administered 2017-03-08: 5 mg via ORAL
  Filled 2017-03-08 (×2): qty 1

## 2017-03-08 MED ORDER — SODIUM CHLORIDE 0.9% FLUSH: 3.0000 mL | INTRAVENOUS | Status: DC | PRN

## 2017-03-08 MED ORDER — ONDANSETRON HCL 4 MG/2ML IJ SOLN: 4.0000 mg | Freq: Four times a day (QID) | INTRAMUSCULAR | Status: DC | PRN

## 2017-03-08 MED ORDER — WARFARIN - PHARMACIST DOSING INPATIENT
Freq: Every day | Status: DC
  Administered 2017-03-09: 17:00:00

## 2017-03-08 MED ORDER — SODIUM CHLORIDE 0.9% FLUSH
3.0000 mL | Freq: Two times a day (BID) | INTRAVENOUS | Status: DC
  Administered 2017-03-09 – 2017-03-11 (×5): 3 mL via INTRAVENOUS

## 2017-03-08 MED ORDER — GABAPENTIN 100 MG PO CAPS
100.0000 mg | ORAL_CAPSULE | Freq: Three times a day (TID) | ORAL | Status: DC
  Administered 2017-03-08 – 2017-03-11 (×8): 100 mg via ORAL
  Filled 2017-03-08 (×8): qty 1

## 2017-03-08 MED ORDER — FUROSEMIDE 20 MG PO TABS
20.0000 mg | ORAL_TABLET | Freq: Every day | ORAL | Status: DC
  Administered 2017-03-09 – 2017-03-11 (×3): 20 mg via ORAL
  Filled 2017-03-08 (×3): qty 1

## 2017-03-08 MED ORDER — DILTIAZEM HCL 100 MG IV SOLR: 5.0000 mg/h | INTRAVENOUS | Status: DC

## 2017-03-08 MED ORDER — HEPARIN (PORCINE) IN NACL 100-0.45 UNIT/ML-% IJ SOLN
700.0000 [IU]/h | INTRAMUSCULAR | Status: DC
  Administered 2017-03-08 – 2017-03-09 (×2): 850 [IU]/h via INTRAVENOUS
  Administered 2017-03-10: 700 [IU]/h via INTRAVENOUS
  Filled 2017-03-08 (×3): qty 250

## 2017-03-08 MED ORDER — ROPINIROLE HCL 0.5 MG PO TABS
0.2500 mg | ORAL_TABLET | Freq: Every day | ORAL | Status: DC
  Administered 2017-03-08: 0.25 mg via ORAL
  Filled 2017-03-08 (×2): qty 1

## 2017-03-08 MED ORDER — SODIUM CHLORIDE 0.9 % IV SOLN: 250.0000 mL | INTRAVENOUS | Status: DC

## 2017-03-08 MED ORDER — DILTIAZEM LOAD VIA INFUSION
10.0000 mg | Freq: Once | INTRAVENOUS | Status: AC
  Administered 2017-03-08: 10 mg via INTRAVENOUS
  Filled 2017-03-08: qty 10

## 2017-03-08 NOTE — Telephone Encounter (Signed)
Did she say she was going to go to the ED? MCr

## 2017-03-08 NOTE — Telephone Encounter (Signed)
New Message    Pt c/o of Chest Pain: STAT if CP now or developed within 24 hours  1. Are you having CP right now?  Started last night and Saturday night she had a episode also   2. Are you experiencing any other symptoms (ex. SOB, nausea, vomiting, sweating)?  no  3. How long have you been experiencing CP?  Since saturday  4. Is your CP continuous or coming and going? Comes and goes  5. Have you taken Nitroglycerin? Yes    bp is 157/93 pulse 115 , very weak and fatigued ?

## 2017-03-08 NOTE — Progress Notes (Addendum)
ANTICOAGULATION CONSULT NOTE - Initial Consult  Pharmacy Consult for heparin Indication: atrial fibrillation  Allergies  Allergen Reactions  . Atorvastatin Other (See Comments)    Myalgias in legs  . Crestor [Rosuvastatin] Other (See Comments)    Myalgias in legs  . Morphine And Related Other (See Comments)    "Crazy thoughts, severe headache, sick"  . Prednisone     On two occasions has caused A fib    Patient Measurements: Height: 5\' 1"  (154.9 cm) Weight: 133 lb (60.3 kg) IBW/kg (Calculated) : 47.8 Heparin Dosing Weight: 59.9  Vital Signs: Temp: 97.8 F (36.6 C) (08/13 0901) Temp Source: Oral (08/13 0901) BP: 111/79 (08/13 1000) Pulse Rate: 111 (08/13 0930)  Labs:  Recent Labs  03/08/17 0917  HGB 13.1  HCT 38.7  PLT 177  CREATININE 1.54*    Estimated Creatinine Clearance: 22.7 mL/min (A) (by C-G formula based on SCr of 1.54 mg/dL (H)).   Medical History: Past Medical History:  Diagnosis Date  . Arthritis    back  . Atrial fibrillation with rapid ventricular response ([place]) 02/2014   a. CHA2DS2VASc = 6 -> on eliquis;  b. 02/2014 s/p DCCV;  c. 08/2014 Echo: EF 55-60%, Gr 2 DD, mild MR, triv AI.  Marland Kitchen CAD (coronary artery disease)    a. s/p CABG x 2 (LIMA->LAD, VG->Diag);  b. 08/2014 Cath: LM nl, LAD 90p, LCX nl, RCA nl/dominant, LIMA->LAD atretic, VG->D2 patent w/ retrograde filling of LAD, EF 55-60%-->Med Rx.  . Diverticulitis 02/21/2012  . GERD (gastroesophageal reflux disease)   . Hyperlipemia   . Hypertension   . Insomnia     Medications:  Infusions:  . diltiazem (CARDIZEM) infusion 5 mg/hr (03/08/17 0945)  . heparin     Assessment: 74 yof presenting with history of Afib. Previously on Xarelto, but no anticoagulants prior to admission 2/2 history of GI bleed. Reported some tightness in chest started on Saturday and returned last night. Pharmacy has been asked to start warfarin.  Goal of Therapy:  Heparin level 0.3-0.7 units/ml  INR: 2-3 Monitor  platelets by anticoagulation protocol: Yes   Plan:  Start heparin infusion at 850 units/hr  Order heparin level in 8 hours Warfarin 5mg  PO QD Monitor CBC and platelets  Jacqueline [name] 03/08/2017,10:12 AM

## 2017-03-08 NOTE — Progress Notes (Signed)
[place] for heparin Indication: atrial fibrillation  Allergies  Allergen Reactions  . Atorvastatin Other (See Comments)    Myalgias in legs  . Crestor [Rosuvastatin] Other (See Comments)    Myalgias in legs  . Morphine And Related Other (See Comments)    "Crazy thoughts, severe headache, sick"  . Prednisone     On two occasions has caused A fib    Patient Measurements: Height: 5\' 3"  (160 cm) Weight: 134 lb 8 oz (61 kg) IBW/kg (Calculated) : 52.4 Heparin Dosing Weight: 59.9  Vital Signs: Temp: 98 F (36.7 C) (08/13 1826) Temp Source: Oral (08/13 1826) BP: 168/112 (08/13 1826) Pulse Rate: 82 (08/13 1826)  Labs:  Recent Labs  03/08/17 0917 03/08/17 1925  HGB 13.1  --   HCT 38.7  --   PLT 177  --   HEPARINUNFRC  --  0.41  CREATININE 1.54*  --     Estimated Creatinine Clearance: 22.5 mL/min (A) (by C-G formula based on SCr of 1.54 mg/dL (H)).  Assessment: 13 yof presenting with history of Afib. Previously on Xarelto, but no anticoagulants prior to admission due to history of GI bleed. Reported some tightness in chest started on Saturday and returned last night.   She was started on heparin earlier today and heparin level this evening is in therapeutic range at 0.41 units/mL. CBC wnl, no bleeding noted.  Goal of Therapy:  Heparin level 0.3-0.7 units/ml  Monitor platelets by anticoagulation protocol: Yes   Plan:  Continue heparin infusion at 850 units/hr  Daily heparin level and CBC  Follow for s/s bleeding  Shyanne Mcclary D. Zabria Liss, PharmD, [place] Clinical Pharmacist Pager: [phone] 03/08/2017 8:17 PM

## 2017-03-08 NOTE — H&P (Signed)
Patient ID: Jacqueline [name] MRN: [id number], DOB/AGE: 04-09-1931   Admit date: 03/08/2017  Requesting Physician: Primary Physician: [name], MD Primary Cardiologist: Dr. [name] Reason for admission: Atrial fibrillation with RVR  HPI: Jacqueline [name] is a 83 y.o. female with a history of CAD ( s/p CABG in 2006, repeat cath 2016 Rx recommended),  paroxysmal atrial fibrillation (s/p DCCV in 2016; Previously on Eliquis dc'd due to GI bleed), diastolic HF EF 76% (2263), HTN, CKD III, HLD who presented to the ER this morning with lightheadedness, palpitations and chest pain thegn found to be in atrial fibrillation with RVR.   She has been feeling general fatigue for the past week.  She has been monitoring her blood pressure and pulse at home daily with normal BPs.  Today she noticed her heart rate was up into the 150s.   In the ER her EKG showed a tachycardic rhythm of SVT vs atrial fibrillation, she was given a diltiazem bolus and started on gtt with a great response- pulse is now normal-- repeat EKG shows atrial fibrillation rate 76. Chest xray shows s/p CABG but otherwise no significant findings.  She is still feeling fatigued but overall much improved. She is hemodynamically stable, well appearing, now rate controlled  in atrial fibrillation and on a heparin drip.   Problem List  Past Medical History:  Diagnosis Date  . Arthritis    back  . Atrial fibrillation with rapid ventricular response ([place]) 02/2014   a. CHA2DS2VASc = 6 -> on eliquis;  b. 02/2014 s/p DCCV;  c. 08/2014 Echo: EF 55-60%, Gr 2 DD, mild MR, triv AI.  Marland Kitchen CAD (coronary artery disease)    a. s/p CABG x 2 (LIMA->LAD, VG->Diag);  b. 08/2014 Cath: LM nl, LAD 90p, LCX nl, RCA nl/dominant, LIMA->LAD atretic, VG->D2 patent w/ retrograde filling of LAD, EF 55-60%-->Med Rx.  . Diverticulitis 02/21/2012  . GERD (gastroesophageal reflux disease)   . Hyperlipemia   . Hypertension   . Insomnia     Past Surgical History:    Procedure Laterality Date  . ABDOMINAL HYSTERECTOMY  1975  . CARDIAC CATHETERIZATION N/A 05/09/2015   Procedure: Right Heart Cath;  Surgeon: Larey Dresser, MD;  Location: [place] CV LAB;  Service: Cardiovascular;  Laterality: N/A;  . CARDIOVERSION N/A 02/28/2014   Procedure: CARDIOVERSION;  Surgeon: Sanda Klein, MD;  Location: [place] [facility] ENDOSCOPY;  Service: Cardiovascular;  Laterality: N/A;  . CATARACT EXTRACTION Bilateral 2014   with lens implanted  . COLONOSCOPY N/A 02/19/2015   Procedure: COLONOSCOPY;  Surgeon: Ladene Artist, MD;  Location: [facility] ENDOSCOPY;  Service: Endoscopy;  Laterality: N/A;  . CORONARY ARTERY BYPASS GRAFT  2006   off-pump bypass surgery with LIMA to the LAD and SVG to the second diagonal artery ( performed by Dr Servando Snare)   . JOINT REPLACEMENT Bilateral    L-2004, R-2006  . LEFT HEART CATHETERIZATION WITH CORONARY/GRAFT ANGIOGRAM N/A 09/17/2014   Procedure: LEFT HEART CATHETERIZATION WITH Beatrix Fetters;  Surgeon: Troy Sine, MD;  Location: [facility] CATH LAB;  Service: Cardiovascular;  Laterality: N/A;  . REIMPLANTATION OF TOTAL KNEE Right 10/08/2014   Procedure: REIMPLANTATION OF RIGHT TOTAL KNEE ARTHROPLASTY WITH REMOVAL OF ANTIBIOTIC SPACER;  Surgeon: Paralee Cancel, MD;  Location: [facility] ORS;  Service: Orthopedics;  Laterality: Right;  . ROTATOR CUFF REPAIR Bilateral    r-1999, l- 2005  . TEE WITHOUT CARDIOVERSION N/A 02/28/2014   Procedure: TRANSESOPHAGEAL ECHOCARDIOGRAM (TEE);  Surgeon: Sanda Klein, MD;  Location:  [facility] ENDOSCOPY;  Service: Cardiovascular;  Laterality: N/A;  . TOTAL KNEE ARTHROPLASTY Right 07/02/2014   Procedure: Resection of Infected Right Total Knee Arthroplasty with placement antibiotic spacer;  Surgeon: Mauri Pole, MD;  Location: [facility] ORS;  Service: Orthopedics;  Laterality: Right;     Allergies  Allergies  Allergen Reactions  . Atorvastatin Other (See Comments)    Myalgias in legs  . Crestor [Rosuvastatin] Other (See Comments)     Myalgias in legs  . Morphine And Related Other (See Comments)    "Crazy thoughts, severe headache, sick"  . Prednisone     On two occasions has caused A fib     Home Medications  Prior to Admission medications   Medication Sig Start Date End Date Taking? Authorizing Provider  acetaminophen (TYLENOL) 500 MG tablet Take 500-1,000 mg by mouth every 6 (six) hours as needed for moderate pain or headache.   Yes [provider]  ALPRAZolam Duanne Moron) 0.5 MG tablet TAKE 1 TABLET BY MOUTH TWICE A DAY AS NEEDED FOR ANXIETY 09/15/16  Yes Hensel, Jamal Collin, MD  aspirin EC 81 MG tablet Take 81 mg by mouth daily.   Yes [provider]  diphenhydrAMINE (BENADRYL) 25 MG tablet Take 25 mg by mouth at bedtime.    Yes [provider]  diphenhydramine-acetaminophen (TYLENOL PM) 25-500 MG TABS tablet Take 1 tablet by mouth at bedtime.   Yes [provider]  furosemide (LASIX) 20 MG tablet TAKE 1 TABLET EVERY DAY 01/01/17  Yes Croitoru, Mihai, MD  gabapentin (NEURONTIN) 100 MG capsule Take 1 capsule (100 mg total) by mouth 3 (three) times daily. 04/01/16  Yes Hensel, Jamal Collin, MD  hydrALAZINE (APRESOLINE) 25 MG tablet TAKE 1/2 TABLET (12.5 MG TOTAL) BY MOUTH 3 TIMES DAILY. PLEASE MAKE APPT FOR REFILLS. Patient taking differently: TAKE 12.5MG  BY MOUTH DAILY AS NEEDED FOR ELEVATED BLOOD PRESSURE 02/09/17  Yes Barrett, Evelene Croon, PA-C  Iron-Vitamins (GERITOL COMPLETE PO) Take 1 tablet by mouth daily.   Yes [provider]  isosorbide mononitrate (IMDUR) 30 MG 24 hr tablet TAKE 1 TABLET BY MOUTH EVERY DAY NEEDS APPT FOR REFILLS 01/11/17  Yes Croitoru, Mihai, MD  NITROSTAT 0.4 MG SL tablet PLACE 1 TABLET (0.4 MG TOTAL) UNDER THE TONGUE EVERY 5 (FIVE) MINUTES AS NEEDED FOR CHEST PAIN. 09/25/15  Yes Croitoru, Mihai, MD  Omega-3 Fatty Acids (FISH OIL) 1000 MG CAPS Take 1-2 capsules by mouth 2 (two) times daily. Takes one in the morning and two capsules at night.   Yes [provider]  omeprazole (PRILOSEC) 20 MG capsule TAKE ONE CAPSULE BY MOUTH EVERY DAY 02/05/17  Yes Hensel, Jamal Collin, MD  polyethylene glycol powder (GLYCOLAX/MIRALAX) powder MIX 17 G AND TAKE BY MOUTH DAILY. Patient taking differently: MIX 17 G AND TAKE BY MOUTH THREE TIMES WEEKLY 07/06/16  Yes Hensel, Jamal Collin, MD  rOPINIRole (REQUIP) 0.25 MG tablet TAKE 1 TABLET BY MOUTH 3 TIMES A DAY Patient taking differently: TAKE 0.25MG  BY MOUTH ONCE DAILY 12/14/16  Yes Hensel, Jamal Collin, MD  simvastatin (ZOCOR) 40 MG tablet TAKE 1/2 A TABLET BY MOUTH AT BEDTIME. Patient taking differently: TAKE 20MG  (1/2 TAB)  BY MOUTH AT BEDTIME. 06/22/16  Yes Hensel, Jamal Collin, MD  traZODone (DESYREL) 100 MG tablet TAKE 1 TABLET (100 MG TOTAL) BY MOUTH AT BEDTIME. 10/16/16  Yes Hensel, Jamal Collin, MD  traMADol (ULTRAM) 50 MG tablet TAKE 1 TABLET BY MOUTH TWICE A DAY AS NEEDED FOR PAIN Patient not taking:  Reported on 03/08/2017 12/14/16   [name], MD    Family History  Family History  Problem Relation Age of Onset  . Parkinson's disease Mother      Family Status  Relation Status  . Mother Deceased     Social History  Social History   Social History  . Marital status: Widowed    Spouse name: N/A  . Number of children: N/A  . Years of education: N/A   Occupational History  . Not on file.   Social History Main Topics  . Smoking status: Never Smoker  . Smokeless tobacco: Never Used  . Alcohol use No  . Drug use: No  . Sexual activity: No   Other Topics Concern  . Not on file   Social History Narrative  . No narrative on file      Review of Systems General:  No chills, fever, night sweats or weight changes.  Cardiovascular:  No  edema, orthopnea,  paroxysmal nocturnal dyspnea. Dermatological: No rash, lesions/masses Respiratory: No cough, dyspnea Urologic: No hematuria, dysuria Abdominal:   No nausea, vomiting, diarrhea, bright red blood per rectum, melena, or hematemesis Neurologic:  No  visual changes, wkns, changes in mental status. All other systems reviewed and are otherwise negative except as noted above.    Physical Exam Blood pressure 111/80, pulse 84, temperature 97.8 F (36.6 C), temperature source Oral, resp. rate 14, height 5\' 1"  (1.549 m), weight 133 lb (60.3 kg), SpO2 96 %.  General: Pleasant, NAD Psych: Normal affect. Neuro: Alert and oriented X 3. Moves all extremities spontaneously. HEENT: Normal  Neck: Supple without bruits or JVD. Lungs:  Resp regular and unlabored, CTA. Heart: Irreg irreg no s3, s4, or murmurs. Abdomen: Soft, non-tender, non-distended, BS + x 4.  Extremities: No clubbing, cyanosis or edema. DP/PT/Radials 2+ and equal bilaterally.    Labs No results for input(s): CKTOTAL, CKMB, TROPONINI in the last 72 hours. Lab Results  Component Value Date   WBC 6.3 03/08/2017   HGB 13.1 03/08/2017   HCT 38.7 03/08/2017   MCV 91.3 03/08/2017   PLT 177 03/08/2017    Recent Labs Lab 03/08/17 0917  NA 140  K 3.6  CL 106  CO2 22  BUN 19  CREATININE 1.54*  CALCIUM 9.5  GLUCOSE 97   Lab Results  Component Value Date   CHOL 149 04/08/2016   HDL 53 04/08/2016   LDLCALC 70 04/08/2016   TRIG 130 04/08/2016   No results found for: DDIMER   Radiology/Studies  Dg Chest Portable 1 View  Result Date: 03/08/2017 CLINICAL DATA:  Tachycardia EXAM: PORTABLE CHEST 1 VIEW COMPARISON:  08/14/2015 FINDINGS: Normal heart size and stable mediastinal contours. Status post CABG. No acute infiltrate or edema. No effusion or pneumothorax. Bilateral glenohumeral osteoarthritis. No acute osseous findings. IMPRESSION: No evidence for active disease. Electronically Signed   By: Monte Fantasia M.D.   On: 03/08/2017 09:33    ECG  Tachycardiac rate 155, likely atrial fibrillation vs SVT.  October 2016 Study Conclusions  - Left ventricle: The cavity size was normal. There was mild   concentric hypertrophy. Systolic function was normal. The    estimated ejection fraction was in the range of 60% to 65%. Wall   motion was normal; there were no regional wall motion   abnormalities. Features are consistent with a pseudonormal left   ventricular filling pattern, with concomitant abnormal relaxation   and increased filling pressure (grade 2 diastolic dysfunction). - Aortic valve: There  was trivial regurgitation. - Mitral valve: There was moderate regurgitation. - Left atrium: The atrium was moderately dilated. - Right atrium: The atrium was mildly dilated. - Tricuspid valve: There was moderate regurgitation. - Pulmonary arteries: Systolic pressure was severely increased. PA   peak pressure: 85 mm Hg (S).  Right Cardiac catheterization 2016  Conclusion   1. Elevated left and right heart filling pressures with prominent v-waves in the PCWP tracing.   2. Mixed pulmonary hypertension, I suspect primarily pulmonary venous hypertension but possible pulmonary arterial hypertension component with PVR 3.7 WU.   I think that the primary problem here is diastolic CHF.  Creatinine is better today, needs diuresis.  Will write for Lasix 60 mg IV bid.      ASSESSMENT AND PLAN  1. Paroxysmal atrial fibrillation RVR: She has likely been in this rhythm for the past week based on her symptoms.  She is now rate controlled on a diltiazem drip which I would like to convert to PO once her heart rate allows. She was started on Heparin by EDP,   previously on Eliquis (2015) but this was discontinued due to GI bleeding (2016), she but does not want to restart this medication because of cost but is open to trying other anticoagulants.  -- Recommend bridging to Coumadin and plan for TEE cardioversion if she does not convert after three days on Anticoag an dilt.. -- Coumadin preferable because of her risk of bleeding and its ability to be reversed  -- Potassium a magnesium are normal. Need to check TSH  CHA2DS2-VASc Score  = 6 ( HTN,Vasc Dz, Age, CHF, female  )  [1 point for hx CHF, HTN, DM, Vascular Dze, Age if 84-74, or Female] [2 points for Age > 58, or Stroke/TIA/TE]  2. Hypertension: BP controlled in ED, will continue home dose of Hydralazine and Imdur  3. Chronic diastolic heart failure:  Does not appear fluid overload continue home dose of lasix 20 mg daily.  Her normal weight is ~130 lb. -- will order daily weights  4. Coronary artery disease s/p CABG: Has not been having typical ACS symptoms; f/u cath 08/2014 showed an atretic LIMA but good distal flow via the other conduits.  She did have some chest pain but this seemed to be more related to her atrial fibrillation with RVR.  -- Cont home dose of Imdur, considering discontinuing aspirin with initiation of Coumadin  5. Hyperlipidemia:  LDL 70 (03/2016) continue Zocor.   Kristopher Glee, PA-C 03/08/2017, 11:47 AM

## 2017-03-08 NOTE — Telephone Encounter (Signed)
Returned call to patient of Dr. [name] regarding chest pain/pressure - h/o CABG, PAF. She states she has had this since Saturday. She states she can't get her breath good. She reports her BP runs low - but this AM it was 157/93 and HR 115 - recheck 112/95 and HR 97 - normal HR mid 70s-80s. She reports palpitations. Medications are up to date - imdur + lasix QAM, hydralazine PRN. Patient took NTGx3 last night. Advised patient she should seek ED eval for acute symptoms of chest pain/pressure, SOB, palpitations. Informed her that she could be in an abnormal heart rhythm that is causing her symptoms, but also given her h/o CAD/CABG she should be evaluated for chest pain.  Patient reports she is going out of town this Saturday and wants to make sure she is OK.  Will route to MD as Juluis Rainier

## 2017-03-08 NOTE — ED Provider Notes (Signed)
[facility] DEPT Provider Note   CSN: [id number] Arrival date & time: 03/08/17  0857     History   Chief Complaint Chief Complaint  Patient presents with  . Chest Pain    HPI Jacqueline [name] is a 83 y.o. female.  HPI   83 year old female with PMhx as below here with lightheadedness, palpitations, CP. Pt reports she has felt weak for the last 1 week. She has had lightheadedness, occasional palpitations. BP readings have been normal at home, however. Over the last 24 hours she has had dull, aching, substernal CP with worsening SOB. She took her BP and noticed her hR was 150s today. She has a h/o AFib and has been DCCVed twice. No blood thinner use however due to h/o GIB. Denies any active signs of GIB. No fever, chills, nausea, vomiting. No other medical complaints. No recent medication changes.  Past Medical History:  Diagnosis Date  . Arthritis    back  . Atrial fibrillation with rapid ventricular response ([place]) 02/2014   a. CHA2DS2VASc = 6 -> on eliquis;  b. 02/2014 s/p DCCV;  c. 08/2014 Echo: EF 55-60%, Gr 2 DD, mild MR, triv AI.  Marland Kitchen CAD (coronary artery disease)    a. s/p CABG x 2 (LIMA->LAD, VG->Diag);  b. 08/2014 Cath: LM nl, LAD 90p, LCX nl, RCA nl/dominant, LIMA->LAD atretic, VG->D2 patent w/ retrograde filling of LAD, EF 55-60%-->Med Rx.  . Diverticulitis 02/21/2012  . GERD (gastroesophageal reflux disease)   . Hyperlipemia   . Hypertension   . Insomnia     Patient Active Problem List   Diagnosis Date Noted  . Hereditary and idiopathic peripheral neuropathy 12/12/2015  . CHF (congestive heart failure), NYHA class II ([place]) 06/05/2015  . Pulmonary arterial hypertension ([place]) 05/17/2015  . Anemia of chronic disease   . Tachy-brady syndrome ([place]) 05/06/2015  . CAD- s/p CABG 05/06/2015  . Encounter for preventive health examination 03/27/2015  . Anemia due to chronic blood loss 10/10/2014  . S/P knee replacement 10/08/2014  . PAF (paroxysmal atrial fibrillation) ([place])  09/24/2014  . Osteoporosis 03/22/2014  . GERD (gastroesophageal reflux disease)   . Chronic kidney disease, stage 2, mildly decreased GFR 12/22/2013  . Abnormal thyroid blood test 12/22/2013  . Health maintenance examination 03/04/2012  . HYPERCHOLESTEROLEMIA 09/23/2006  . HYPERTENSION, BENIGN SYSTEMIC 09/23/2006  . Coronary atherosclerosis 09/23/2006  . RHINITIS, ALLERGIC 09/23/2006  . REFLUX ESOPHAGITIS 09/23/2006  . DIVERTICULOSIS OF COLON 09/23/2006  . OSTEOARTHRITIS, MULTI SITES 09/23/2006  . BACK PAIN, LOW 09/23/2006  . INSOMNIA NOS 09/23/2006    Past Surgical History:  Procedure Laterality Date  . ABDOMINAL HYSTERECTOMY  1975  . CARDIAC CATHETERIZATION N/A 05/09/2015   Procedure: Right Heart Cath;  Surgeon: Larey Dresser, MD;  Location: [facility];  Service: Cardiovascular;  Laterality: N/A;  . CARDIOVERSION N/A 02/28/2014   Procedure: CARDIOVERSION;  Surgeon: Sanda Klein, MD;  Location: [facility];  Service: Cardiovascular;  Laterality: N/A;  . CATARACT EXTRACTION Bilateral 2014   with lens implanted  . COLONOSCOPY N/A 02/19/2015   Procedure: COLONOSCOPY;  Surgeon: Ladene Artist, MD;  Location: [facility];  Service: Endoscopy;  Laterality: N/A;  . CORONARY ARTERY BYPASS GRAFT  2006   off-pump bypass surgery with LIMA to the LAD and SVG to the second diagonal artery ( performed by Dr Servando Snare)   . JOINT REPLACEMENT Bilateral    L-2004, R-2006  . LEFT HEART CATHETERIZATION WITH CORONARY/GRAFT ANGIOGRAM N/A 09/17/2014   Procedure: LEFT HEART CATHETERIZATION WITH  Beatrix Fetters;  Surgeon: Troy Sine, MD;  Location: [facility] CATH LAB;  Service: Cardiovascular;  Laterality: N/A;  . REIMPLANTATION OF TOTAL KNEE Right 10/08/2014   Procedure: REIMPLANTATION OF RIGHT TOTAL KNEE ARTHROPLASTY WITH REMOVAL OF ANTIBIOTIC SPACER;  Surgeon: Paralee Cancel, MD;  Location: [facility] OR[facility];  Service: Orthopedics;  Laterality: Right;  . ROTATOR CUFF REPAIR Bilateral    r-1999, l-  2005  . TEE WITHOUT CARDIOVERSION N/A 02/28/2014   Procedure: TRANSESOPHAGEAL ECHOCARDIOGRAM (TEE);  Surgeon: Sanda Klein, MD;  Location: [facility];  Service: Cardiovascular;  Laterality: N/A;  . TOTAL KNEE ARTHROPLASTY Right 07/02/2014   Procedure: Resection of Infected Right Total Knee Arthroplasty with placement antibiotic spacer;  Surgeon: Mauri Pole, MD;  Location: [facility] OR[facility];  Service: Orthopedics;  Laterality: Right;    OB History    No data available       Home Medications    Prior to Admission medications   Medication Sig Start Date End Date Taking? Authorizing Provider  acetaminophen (TYLENOL) 500 MG tablet Take 500-1,000 mg by mouth every 6 (six) hours as needed for moderate pain or headache.    [provider]  ALPRAZolam Duanne Moron) 0.5 MG tablet TAKE 1 TABLET BY MOUTH TWICE A DAY AS NEEDED FOR ANXIETY 09/15/16   [name], MD  aspirin EC 81 MG tablet Take 81 mg by mouth daily.    [provider]  cycloSPORINE (RESTASIS) 0.05 % ophthalmic emulsion 1 drop 2 (two) times daily. Dose per ophtho    [provider]  diphenhydrAMINE (BENADRYL) 25 MG tablet Take 50 mg by mouth 2 (two) times daily as needed for allergies (eye allergies).     [provider]  furosemide (LASIX) 20 MG tablet TAKE 1 TABLET EVERY DAY 01/01/17   Croitoru, Mihai, MD  gabapentin (NEURONTIN) 100 MG capsule Take 1 capsule (100 mg total) by mouth 3 (three) times daily. 04/01/16   [name], MD  hydrALAZINE (APRESOLINE) 25 MG tablet Take 25 mg by mouth as directed. TAKE 1/2 TABLET BY MOUTH AS NEEDED FOR BLOOD PRESSURE EQUAL TO OR GREATER THAN 180    [provider]  hydrALAZINE (APRESOLINE) 25 MG tablet TAKE 1/2 TABLET (12.5 MG TOTAL) BY MOUTH 3 TIMES DAILY. PLEASE MAKE APPT FOR REFILLS. 02/09/17   Barrett, Evelene Croon, PA-C  Iron-Vitamins (GERITOL COMPLETE PO) Take 1 tablet by mouth daily.    [provider]  isosorbide mononitrate (IMDUR) 30 MG 24 hr  tablet TAKE 1 TABLET BY MOUTH EVERY DAY NEEDS APPT FOR REFILLS 01/11/17   Croitoru, Mihai, MD  NITROSTAT 0.4 MG SL tablet PLACE 1 TABLET (0.4 MG TOTAL) UNDER THE TONGUE EVERY 5 (FIVE) MINUTES AS NEEDED FOR CHEST PAIN. 09/25/15   Croitoru, Mihai, MD  Omega-3 Fatty Acids (FISH OIL) 1000 MG CAPS Take 1-2 capsules by mouth 2 (two) times daily. Takes one in the morning and two capsules at night.    [provider]  omeprazole (PRILOSEC) 20 MG capsule TAKE ONE CAPSULE BY MOUTH EVERY DAY 02/05/17   Hensel, Jamal Collin, MD  polyethylene glycol powder (GLYCOLAX/MIRALAX) powder MIX 17 G AND TAKE BY MOUTH DAILY. 07/06/16   [name], MD  Probiotic Product (PROBIOTIC PO) Take 1 capsule by mouth daily. Reported on 10/31/2015    [provider]  rOPINIRole (REQUIP) 0.25 MG tablet TAKE 1 TABLET BY MOUTH 3 TIMES A DAY 12/14/16   [name], MD  simvastatin (ZOCOR) 40 MG tablet TAKE 1/2 A TABLET BY  MOUTH AT BEDTIME. 06/22/16   [name], MD  traMADol (ULTRAM) 50 MG tablet TAKE 1 TABLET BY MOUTH TWICE A DAY AS NEEDED FOR PAIN 12/14/16   [name], MD  traZODone (DESYREL) 100 MG tablet TAKE 1 TABLET (100 MG TOTAL) BY MOUTH AT BEDTIME. 10/16/16   [name], MD    Family History Family History  Problem Relation Age of Onset  . Parkinson's disease Mother     Social History Social History  Substance Use Topics  . Smoking status: Never Smoker  . Smokeless tobacco: Never Used  . Alcohol use No     Allergies   Atorvastatin; Crestor [rosuvastatin]; Morphine and related; and Prednisone   Review of Systems Review of Systems  Constitutional: Positive for fatigue. Negative for chills and fever.  HENT: Negative for congestion and rhinorrhea.   Eyes: Negative for visual disturbance.  Respiratory: Positive for chest tightness and shortness of breath. Negative for cough and wheezing.   Cardiovascular: Positive for chest pain and leg swelling.  Gastrointestinal:  Negative for abdominal pain, diarrhea, nausea and vomiting.  Genitourinary: Negative for dysuria and flank pain.  Musculoskeletal: Negative for neck pain and neck stiffness.  Skin: Negative for rash and wound.  Allergic/Immunologic: Negative for immunocompromised state.  Neurological: Negative for syncope, weakness and headaches.  All other systems reviewed and are negative.    Physical Exam Updated Vital Signs BP 109/77   Pulse 87   Temp 97.8 F (36.6 C) (Oral)   Resp 14   Ht 5\' 1"  (1.549 m)   Wt 60.3 kg (133 lb)   SpO2 94%   BMI 25.13 kg/m   Physical Exam  Constitutional: She is oriented to person, place, and time. She appears well-developed and well-nourished. No distress.  HENT:  Head: Normocephalic and atraumatic.  Eyes: Conjunctivae are normal.  Neck: Neck supple.  Cardiovascular: Normal heart sounds.  An irregularly irregular rhythm present. Tachycardia present.  Exam reveals no friction rub.   No murmur heard. Pulmonary/Chest: Effort normal and breath sounds normal. No respiratory distress. She has no wheezes. She has no rales.  Abdominal: She exhibits no distension.  Musculoskeletal: She exhibits no edema.  Neurological: She is alert and oriented to person, place, and time. She exhibits normal muscle tone.  Skin: Skin is warm. Capillary refill takes less than 2 seconds.  Psychiatric: She has a normal mood and affect.  Nursing note and vitals reviewed.    ED Treatments / Results  Labs (all labs ordered are listed, but only abnormal results are displayed) Labs Reviewed  BASIC METABOLIC PANEL - Abnormal; Notable for the following:       Result Value   Creatinine, Ser 1.54 (*)    GFR calc non Af Amer 30 (*)    GFR calc Af Amer 35 (*)    All other components within normal limits  CBC  BRAIN NATRIURETIC PEPTIDE  MAGNESIUM  I-STAT TROPONIN, ED    EKG  EKG Interpretation  Date/Time:  Monday March 08 2017 09:00:39 EDT Ventricular Rate:  155 PR  Interval:    QRS Duration: 92 QT Interval:  252 QTC Calculation: 404 R Axis:   43 Text Interpretation:  Supraventricular tachycardia with Premature ventricular complexes or Fusion complexes Marked ST abnormality, possible inferior subendocardial injury Abnormal ECG Since last tracing, SVT versus Atrial Fibrillation has replaced sinus rhythm There are now diffuse ST-t changes likely rate-related from demand  Confirmed by Duffy Bruce [phone]) on 03/08/2017 9:34:49 AM  Radiology Dg Chest Portable 1 View  Result Date: 03/08/2017 CLINICAL DATA:  Tachycardia EXAM: PORTABLE CHEST 1 VIEW COMPARISON:  08/14/2015 FINDINGS: Normal heart size and stable mediastinal contours. Status post CABG. No acute infiltrate or edema. No effusion or pneumothorax. Bilateral glenohumeral osteoarthritis. No acute osseous findings. IMPRESSION: No evidence for active disease. Electronically Signed   By: Monte Fantasia M.D.   On: 03/08/2017 09:33    Procedures .Critical Care Performed by: Duffy Bruce Authorized by: Duffy Bruce      (including critical care time)  CRITICAL CARE Performed by: Evonnie Pat   Total critical care time: 35 minutes  Critical care time was exclusive of separately billable procedures and treating other patients.  Critical care was necessary to treat or prevent imminent or life-threatening deterioration.  Critical care was time spent personally by me on the following activities: development of treatment plan with patient and/or surrogate as well as nursing, discussions with consultants, evaluation of patient's response to treatment, examination of patient, obtaining history from patient or surrogate, ordering and performing treatments and interventions, ordering and review of laboratory studies, ordering and review of radiographic studies, pulse oximetry and re-evaluation of patient's condition.    Medications Ordered in ED Medications  diltiazem (CARDIZEM) 1 mg/mL  load via infusion 10 mg (10 mg Intravenous Bolus from Bag 03/08/17 0949)    And  diltiazem (CARDIZEM) 100 mg in dextrose 5 % 100 mL (1 mg/mL) infusion (5 mg/hr Intravenous New Bag/Given 03/08/17 0945)  heparin ADULT infusion 100 units/mL (25000 units/266mL sodium chloride 0.45%) (not administered)     Initial Impression / Assessment and Plan / ED Course  I have reviewed the triage vital signs and the nursing notes.  Pertinent labs & imaging results that were available during my care of the patient were reviewed by me and considered in my medical decision making (see chart for details).     83 yo F here with AFib RVR. Unclear etiology, will check labs, BNP. Pt not on blood thinner despite CHADS-VASC of 6, due to h/o GIB. No signs of GIB at this time. It sounds like she may have been in/out of AFib all week. Will start dilt bolus/gtt, check labs, and plan for likely admission. Will c/s Cards as pt sees Dr. Orene Desanctis. Given absence of signs of GIB at this time, feel benefits > risks of short term heparin and will start heparin at this time in hopes that if pt has no clot on TTE, may be candidate for DCCV once again.  Pt improved on dilt gtt at 5 mg/hr. Cards consulted. Labs o/w largely unremarkable.  Final Clinical Impressions(s) / ED Diagnoses   Final diagnoses:  Atrial fibrillation with rapid ventricular response ([place])  Other chest pain    New Prescriptions New Prescriptions   No medications on file     Duffy Bruce, MD 03/08/17 2047

## 2017-03-08 NOTE — Telephone Encounter (Signed)
Per EPIC, patient is currently in ED

## 2017-03-08 NOTE — ED Triage Notes (Signed)
Per Pt, Pt reports having chest tightness that started on Saturday night. Took one Nitro with relief. Pt reports the tightness returned last night and has continued. Hx of HTN, Afib. Pt reports some SOB.

## 2017-03-09 LAB — BASIC METABOLIC PANEL
ANION GAP: 9 (ref 5–15)
BUN: 25 mg/dL — ABNORMAL HIGH (ref 6–20)
CALCIUM: 9.1 mg/dL (ref 8.9–10.3)
CO2: 25 mmol/L (ref 22–32)
Chloride: 104 mmol/L (ref 101–111)
Creatinine, Ser: 1.75 mg/dL — ABNORMAL HIGH (ref 0.44–1.00)
GFR, EST AFRICAN AMERICAN: 30 mL/min — AB (ref >60)
GFR, EST NON AFRICAN AMERICAN: 26 mL/min — AB (ref >60)
Glucose, Bld: 110 mg/dL — ABNORMAL HIGH (ref 65–99)
POTASSIUM: 3.3 mmol/L — AB (ref 3.5–5.1)
SODIUM: 138 mmol/L (ref 135–145)

## 2017-03-09 LAB — CBC
HCT: 35.5 % — ABNORMAL LOW (ref 36.0–46.0)
Hemoglobin: 11.9 g/dL — ABNORMAL LOW (ref 12.0–15.0)
MCH: 30.1 pg (ref 26.0–34.0)
MCHC: 33.5 g/dL (ref 30.0–36.0)
MCV: 89.9 fL (ref 78.0–100.0)
PLATELETS: 169 10*3/uL (ref 150–400)
RBC: 3.95 MIL/uL (ref 3.87–5.11)
RDW: 13.7 % (ref 11.5–15.5)
WBC: 6.2 10*3/uL (ref 4.0–10.5)

## 2017-03-09 LAB — HEPARIN LEVEL (UNFRACTIONATED): HEPARIN UNFRACTIONATED: 0.56 [IU]/mL (ref 0.30–0.70)

## 2017-03-09 LAB — PROTIME-INR
INR: 1.02
PROTHROMBIN TIME: 13.4 s (ref 11.4–15.2)

## 2017-03-09 MED ORDER — DILTIAZEM HCL 30 MG PO TABS
30.0000 mg | ORAL_TABLET | Freq: Four times a day (QID) | ORAL | Status: DC
  Administered 2017-03-09 – 2017-03-10 (×4): 30 mg via ORAL
  Filled 2017-03-09 (×4): qty 1

## 2017-03-09 MED ORDER — SODIUM CHLORIDE 0.9 % IV SOLN: INTRAVENOUS | Status: DC

## 2017-03-09 MED ORDER — WARFARIN SODIUM 5 MG PO TABS
5.0000 mg | ORAL_TABLET | Freq: Once | ORAL | Status: AC
  Administered 2017-03-09: 5 mg via ORAL
  Filled 2017-03-09: qty 1

## 2017-03-09 MED ORDER — ROPINIROLE HCL 0.5 MG PO TABS
0.2500 mg | ORAL_TABLET | Freq: Every day | ORAL | Status: DC
  Administered 2017-03-09 – 2017-03-10 (×2): 0.25 mg via ORAL
  Filled 2017-03-09 (×2): qty 1

## 2017-03-09 MED ORDER — POTASSIUM CHLORIDE CRYS ER 20 MEQ PO TBCR
40.0000 meq | EXTENDED_RELEASE_TABLET | Freq: Once | ORAL | Status: AC
  Administered 2017-03-09: 40 meq via ORAL
  Filled 2017-03-09: qty 2

## 2017-03-09 NOTE — Progress Notes (Signed)
Progress Note  Patient Name: Jacqueline [name] Date of Encounter: 03/09/2017  Primary Cardiologist: Dr. Sallyanne [name]  Subjective   No signs of bleeding. No CP or SOB. Still feels somewhat fatigued but better than yesterday.  Inpatient Medications    Scheduled Meds: . furosemide  20 mg Oral Daily  . gabapentin  100 mg Oral TID  . isosorbide mononitrate  30 mg Oral Daily  . rOPINIRole  0.25 mg Oral QHS  . simvastatin  40 mg Oral q1800  . sodium chloride flush  3 mL Intravenous Q12H  . traZODone  100 mg Oral QHS  . Warfarin - Pharmacist Dosing Inpatient   Does not apply q1800   Continuous Infusions: . sodium chloride    . diltiazem (CARDIZEM) infusion 5 mg/hr (03/09/17 0048)  . heparin 850 Units/hr (03/08/17 1836)   PRN Meds: acetaminophen, ALPRAZolam, ondansetron (ZOFRAN) IV, sodium chloride flush   Vital Signs    Vitals:   03/08/17 1730 03/08/17 1826 03/08/17 2033 03/09/17 0507  BP: (!) 144/87 (!) 168/112 (!) 121/93 115/66  Pulse: 71 82 (!) 117 (!) 110  Resp: 18 17 12 16   Temp:  98 F (36.7 C) 98.4 F (36.9 C) 98 F (36.7 C)  TempSrc:  Oral Oral Oral  SpO2: 97%  96% 98%  Weight:  134 lb 8 oz (61 kg)  135 lb 6.4 oz (61.4 kg)  Height:  5\' 3"  (1.6 m)      Intake/Output Summary (Last 24 hours) at 03/09/17 0953 Last data filed at 03/09/17 0700  Gross per 24 hour  Intake            388.5 ml  Output              700 ml  Net           -311.5 ml   Filed Weights   03/08/17 0902 03/08/17 1826 03/09/17 0507  Weight: 133 lb (60.3 kg) 134 lb 8 oz (61 kg) 135 lb 6.4 oz (61.4 kg)    Telemetry    Rate controlled atrial fibrillation - Personally Reviewed   Physical Exam   GEN: Well nourished, well developed HEENT: normal  Neck: no JVD, carotid bruits, or masses Cardiac: + Atrial fibrillation. no murmurs, rubs, or gallops,no edema. Intact distal pulses bilaterally.  Respiratory: clear to auscultation bilaterally, normal work of breathing GI: soft, nontender,  nondistended, + BS MS: no deformity or atrophy  Skin: warm and dry, no rash Neuro: Alert and Oriented x 3, Strength and sensation are intact Psych:   Full affect  Labs    Chemistry  Recent Labs Lab 03/08/17 0917 03/09/17 0206  NA 140 138  K 3.6 3.3*  CL 106 104  CO2 22 25  GLUCOSE 97 110*  BUN 19 25*  CREATININE 1.54* 1.75*  CALCIUM 9.5 9.1  GFRNONAA 30* 26*  GFRAA 35* 30*  ANIONGAP 12 9     Hematology  Recent Labs Lab 03/08/17 0917 03/09/17 0206  WBC 6.3 6.2  RBC 4.24 3.95  HGB 13.1 11.9*  HCT 38.7 35.5*  MCV 91.3 89.9  MCH 30.9 30.1  MCHC 33.9 33.5  RDW 13.9 13.7  PLT 177 169    Cardiac EnzymesNo results for input(s): TROPONINI in the last 168 hours.   Recent Labs Lab 03/08/17 0926  TROPIPOC 0.00     Radiology    Dg Chest Portable 1 View  Result Date: 03/08/2017 CLINICAL DATA:  Tachycardia EXAM: PORTABLE CHEST 1 VIEW COMPARISON:  08/14/2015 FINDINGS:  Normal heart size and stable mediastinal contours. Status post CABG. No acute infiltrate or edema. No effusion or pneumothorax. Bilateral glenohumeral osteoarthritis. No acute osseous findings. IMPRESSION: No evidence for active disease. Electronically Signed   By: Monte Fantasia M.D.   On: 03/08/2017 09:33    Cardiac Studies   October 2016 Study Conclusions  - Left ventricle: The cavity size was normal. There was mild concentric hypertrophy. Systolic function was normal. The estimated ejection fraction was in the range of 60% to 65%. Wall motion was normal; there were no regional wall motion abnormalities. Features are consistent with a pseudonormal left ventricular filling pattern, with concomitant abnormal relaxation and increased filling pressure (grade 2 diastolic dysfunction). - Aortic valve: There was trivial regurgitation. - Mitral valve: There was moderate regurgitation. - Left atrium: The atrium was moderately dilated. - Right atrium: The atrium was mildly dilated. -  Tricuspid valve: There was moderate regurgitation. - Pulmonary arteries: Systolic pressure was severely increased. PA peak pressure: 85 mm Hg (S).  Right Cardiac catheterization 2016  Conclusion   1. Elevated left and right heart filling pressures with prominent v-waves in the PCWP tracing.  2. Mixed pulmonary hypertension, I suspect primarily pulmonary venous hypertension but possible pulmonary arterial hypertension component with PVR 3.7 WU.   I think that the primary problem here is diastolic CHF. Creatinine is better today, needs diuresis. Will write for Lasix 60 mg IV bid.      Patient Profile     Jacqueline [name] is a 83 y.o. female with a history of CAD ( s/p CABG in 2006, repeat cath 2016 Rx recommended),  paroxysmal atrial fibrillation (s/p DCCV in 2016; Previously on Eliquis dc'd due to GI bleed), diastolic HF EF 81% (0175), HTN, CKD III, HLD who presented to the ER this morning with lightheadedness, palpitations and chest pain thegn found to be in atrial fibrillation with RVR.   Assessment & Plan    1 Paroxysmal atrial fibrillation: She is on 5mg  on the Cardizem drip. Stayed in stable rates overnight. I would like to transition her to oral medication. She is on Heparin with plans of being transitioned to Coumadin.  -- convert IV Cardizem to PO -- TEE DCCV scheduled for Thursday -- Plan to be on Coumadin outpatient -- TSH wnl, potassium mildly low (will replace)  2. Hypertension: Moderately controlled in the ED. Will continue to monitor closely.  3. CKD stage III:  Variable creatinines in the past (estimated baseline creatinine is 1.5) Today is sCr 1.75, this is evaluated from 1.54 yesterday.  4.  Chronic diastolic heart failure: Weight yesterday 134, today is is 135. We will watch closely. She is overall at a net loss and does not appear fluid overloaded -- lets hold her home dose of Lasix 20 mg  5.  Coronary artery disease s/p CABG: Continues to deny chest pain.  Cath from 08/2014 she had an atretic LIMA but good distal flow via the other conduits. We will not be pursuing any ischemic evaluation.  6.    Hyperlipidemia:  LDL 70 (03/2016) continue Zocor.  7. Anemia: Her hemoglobin this morning is 11.9, was 13.1 yesterday. We are going to need to watch this very closely.  CHA2DS2-VASc Score  = 6 ( HTN,Vasc Dz, Age, CHF, female )  [1 point for hx CHF, HTN, DM, Vascular Dze, Age if 39-74, or Female] [2 points for Age > 31, or Stroke/TIA/TE]  Signed, Linus Mako, PA-C  03/09/2017, 9:53 AM

## 2017-03-09 NOTE — Progress Notes (Signed)
Jacqueline [name] for heparin/coumadin Indication: atrial fibrillation  Allergies  Allergen Reactions  . Atorvastatin Other (See Comments)    Myalgias in legs  . Crestor [Rosuvastatin] Other (See Comments)    Myalgias in legs  . Morphine And Related Other (See Comments)    "Crazy thoughts, severe headache, sick"  . Prednisone     On two occasions has caused A fib    Patient Measurements: Height: 5\' 3"  (160 cm) Weight: 135 lb 6.4 oz (61.4 kg) IBW/kg (Calculated) : 52.4 Heparin Dosing Weight: 59.9  Vital Signs: Temp: 98 F (36.7 C) (08/14 0507) Temp Source: Oral (08/14 0507) BP: 115/66 (08/14 0507) Pulse Rate: 110 (08/14 0507)  Labs:  Recent Labs  03/08/17 0917 03/08/17 1925 03/09/17 0206  HGB 13.1  --  11.9*  HCT 38.7  --  35.5*  PLT 177  --  169  LABPROT  --   --  13.4  INR  --   --  1.02  HEPARINUNFRC  --  0.41 0.56  CREATININE 1.54*  --  1.75*    Estimated Creatinine Clearance: 19.8 mL/min (A) (by C-G formula based on SCr of 1.75 mg/dL (H)).   Medical History: Past Medical History:  Diagnosis Date  . Arthritis    back  . Atrial fibrillation with rapid ventricular response ([place]) 02/2014   a. CHA2DS2VASc = 6 -> on eliquis;  b. 02/2014 s/p DCCV;  c. 08/2014 Echo: EF 55-60%, Gr 2 DD, mild MR, triv AI.  Marland Kitchen CAD (coronary artery disease)    a. s/p CABG x 2 (LIMA->LAD, VG->Diag);  b. 08/2014 Cath: LM nl, LAD 90p, LCX nl, RCA nl/dominant, LIMA->LAD atretic, VG->D2 patent w/ retrograde filling of LAD, EF 55-60%-->Med Rx.  . Diverticulitis 02/21/2012  . GERD (gastroesophageal reflux disease)   . Hyperlipemia   . Hypertension   . Insomnia     Medications:  Infusions:  . sodium chloride    . diltiazem (CARDIZEM) infusion 5 mg/hr (03/09/17 0048)  . heparin 850 Units/hr (03/08/17 1836)   Assessment: 40 yof presenting with history of Afib. Previously on Xarelto, but no anticoagulants prior to admission 2/2 history of GI bleed.  Pharmacy consulted to dose heparin/coumadin. -heparin level = 0.56, INR= 1.02 (D2 coumadin)  Goal of Therapy:  Heparin level 0.3-0.7 units/ml  INR: 2-3 Monitor platelets by anticoagulation protocol: Yes   Plan:  -No heparin changes needed -Coumadin 5mg  po today -daily INR, heparin level and CBC  Hildred Laser, Pharm D 03/09/2017 10:25 AM

## 2017-03-10 LAB — CBC
HEMATOCRIT: 35.6 % — AB (ref 36.0–46.0)
Hemoglobin: 11.9 g/dL — ABNORMAL LOW (ref 12.0–15.0)
MCH: 30.4 pg (ref 26.0–34.0)
MCHC: 33.4 g/dL (ref 30.0–36.0)
MCV: 90.8 fL (ref 78.0–100.0)
PLATELETS: 180 10*3/uL (ref 150–400)
RBC: 3.92 MIL/uL (ref 3.87–5.11)
RDW: 13.8 % (ref 11.5–15.5)
WBC: 5.3 10*3/uL (ref 4.0–10.5)

## 2017-03-10 LAB — BASIC METABOLIC PANEL
ANION GAP: 10 (ref 5–15)
BUN: 28 mg/dL — ABNORMAL HIGH (ref 6–20)
CALCIUM: 9.6 mg/dL (ref 8.9–10.3)
CO2: 24 mmol/L (ref 22–32)
CREATININE: 1.71 mg/dL — AB (ref 0.44–1.00)
Chloride: 106 mmol/L (ref 101–111)
GFR, EST AFRICAN AMERICAN: 30 mL/min — AB (ref >60)
GFR, EST NON AFRICAN AMERICAN: 26 mL/min — AB (ref >60)
Glucose, Bld: 104 mg/dL — ABNORMAL HIGH (ref 65–99)
Potassium: 4.3 mmol/L (ref 3.5–5.1)
SODIUM: 140 mmol/L (ref 135–145)

## 2017-03-10 LAB — PROTIME-INR
INR: 1.25
Prothrombin Time: 15.8 seconds — ABNORMAL HIGH (ref 11.4–15.2)

## 2017-03-10 LAB — HEPARIN LEVEL (UNFRACTIONATED)
HEPARIN UNFRACTIONATED: 0.88 [IU]/mL — AB (ref 0.30–0.70)
HEPARIN UNFRACTIONATED: 0.56 [IU]/mL (ref 0.30–0.70)

## 2017-03-10 MED ORDER — DILTIAZEM HCL ER COATED BEADS 180 MG PO CP24
180.0000 mg | ORAL_CAPSULE | Freq: Every day | ORAL | Status: DC
  Administered 2017-03-10 – 2017-03-11 (×2): 180 mg via ORAL
  Filled 2017-03-10 (×2): qty 1

## 2017-03-10 MED ORDER — WARFARIN SODIUM 5 MG PO TABS
5.0000 mg | ORAL_TABLET | Freq: Once | ORAL | Status: AC
  Administered 2017-03-10: 5 mg via ORAL
  Filled 2017-03-10: qty 1

## 2017-03-10 MED ORDER — POLYETHYLENE GLYCOL 3350 17 G PO PACK
17.0000 g | PACK | Freq: Once | ORAL | Status: AC
  Administered 2017-03-10: 17 g via ORAL
  Filled 2017-03-10: qty 1

## 2017-03-10 NOTE — Plan of Care (Signed)
Problem: Pain Managment: Goal: General experience of comfort will improve Outcome: Completed/Met Date Met: 03/10/17 Patient denies pain   Problem: Physical Regulation: Goal: Will remain free from infection Outcome: Progressing Pt displays no signs of infection   Problem: Skin Integrity: Goal: Risk for impaired skin integrity will decrease Outcome: Progressing Verbalizes understanding of need to reposition frequently to prevent skin breakdown

## 2017-03-10 NOTE — Progress Notes (Signed)
ANTICOAGULATION CONSULT NOTE - Follow Up Consult  Pharmacy Consult for Heparin  Indication: atrial fibrillation  Allergies  Allergen Reactions  . Atorvastatin Other (See Comments)    Myalgias in legs  . Crestor [Rosuvastatin] Other (See Comments)    Myalgias in legs  . Morphine And Related Other (See Comments)    "Crazy thoughts, severe headache, sick"  . Prednisone     On two occasions has caused A fib    Patient Measurements: Height: 5\' 3"  (160 cm) Weight: 134 lb 1.6 oz (60.8 kg) IBW/kg (Calculated) : 52.4  Vital Signs: Temp: 98.1 F (36.7 C) (08/15 1300) Temp Source: Oral (08/15 1300) BP: 105/75 (08/15 1300) Pulse Rate: 93 (08/15 1200)  Labs:  Recent Labs  03/08/17 0917  03/09/17 0206 03/10/17 0405 03/10/17 1446  HGB 13.1  --  11.9* 11.9*  --   HCT 38.7  --  35.5* 35.6*  --   PLT 177  --  169 180  --   LABPROT  --   --  13.4 15.8*  --   INR  --   --  1.02 1.25  --   HEPARINUNFRC  --   < > 0.56 0.88* 0.56  CREATININE 1.54*  --  1.75* 1.71*  --   < > = values in this interval not displayed.  Estimated Creatinine Clearance: 20.3 mL/min (A) (by C-G formula based on SCr of 1.71 mg/dL (H)).   Assessment: Heparin/warfarin overlap for afib.  Heparin level therapeutic this afternoon  Goal of Therapy:  Heparin level 0.3-0.7 units/ml Monitor platelets by anticoagulation protocol: Yes   Plan:  Continue heparin at 700 units / hr Follow up AM labs  Thank you Anette Guarneri, PharmD [phone]  03/10/2017,3:27 PM

## 2017-03-10 NOTE — Progress Notes (Signed)
Progress Note  Patient Name: Jacqueline [name] Date of Encounter: 03/10/2017  Primary Cardiologist: Dr. [name]  Subjective   Reports significant palpitations and dyspnea with minimal ambulation to the restroom. No symptoms at rest. Anxious to have her TEE/DCCV as she has a family beach trip this weekend.   Inpatient Medications    Scheduled Meds: . diltiazem  30 mg Oral Q6H  . furosemide  20 mg Oral Daily  . gabapentin  100 mg Oral TID  . isosorbide mononitrate  30 mg Oral Daily  . rOPINIRole  0.25 mg Oral QHS  . simvastatin  40 mg Oral q1800  . sodium chloride flush  3 mL Intravenous Q12H  . traZODone  100 mg Oral QHS  . Warfarin - Pharmacist Dosing Inpatient   Does not apply q1800   Continuous Infusions: . sodium chloride    . heparin 700 Units/hr (03/10/17 0603)   PRN Meds: acetaminophen, ALPRAZolam, ondansetron (ZOFRAN) IV, sodium chloride flush   Vital Signs    Vitals:   03/09/17 2041 03/10/17 0439 03/10/17 0448 03/10/17 0957  BP: 113/65 118/79 112/78 115/75  Pulse: (!) 103 95    Resp: (!) 21 18  14   Temp: 98.4 F (36.9 C) 98.2 F (36.8 C)    TempSrc:      SpO2: 95% 97%  100%  Weight:  134 lb 1.6 oz (60.8 kg)    Height:        Intake/Output Summary (Last 24 hours) at 03/10/17 1118 Last data filed at 03/10/17 0300  Gross per 24 hour  Intake            796.5 ml  Output              500 ml  Net            296.5 ml   Filed Weights   03/08/17 1826 03/09/17 0507 03/10/17 0439  Weight: 134 lb 8 oz (61 kg) 135 lb 6.4 oz (61.4 kg) 134 lb 1.6 oz (60.8 kg)    Telemetry    Atrial fibrillation, HR in the 90's to 110's. Increasing to the 140's with activity. - Personally Reviewed  ECG    No new tracings.   Physical Exam   General: Well developed, elderly Caucasian female appearing in no acute distress. Head: Normocephalic, atraumatic.  Neck: Supple without bruits, JVD not elevated. Lungs:  Resp regular and unlabored, CTA without wheezing or  rales. Heart: Irregularly irregular, S1, S2, no S3, S4, or murmur; no rub. Abdomen: Soft, non-tender, non-distended with normoactive bowel sounds. No hepatomegaly. No rebound/guarding. No obvious abdominal masses. Extremities: No clubbing, cyanosis, or lower extremity edema. Distal pedal pulses are 2+ bilaterally. Neuro: Alert and oriented X 3. Moves all extremities spontaneously. Psych: Normal affect.  Labs    Chemistry Recent Labs Lab 03/08/17 0917 03/09/17 0206 03/10/17 0405  NA 140 138 140  K 3.6 3.3* 4.3  CL 106 104 106  CO2 22 25 24   GLUCOSE 97 110* 104*  BUN 19 25* 28*  CREATININE 1.54* 1.75* 1.71*  CALCIUM 9.5 9.1 9.6  GFRNONAA 30* 26* 26*  GFRAA 35* 30* 30*  ANIONGAP 12 9 10      Hematology Recent Labs Lab 03/08/17 0917 03/09/17 0206 03/10/17 0405  WBC 6.3 6.2 5.3  RBC 4.24 3.95 3.92  HGB 13.1 11.9* 11.9*  HCT 38.7 35.5* 35.6*  MCV 91.3 89.9 90.8  MCH 30.9 30.1 30.4  MCHC 33.9 33.5 33.4  RDW 13.9 13.7 13.8  PLT  177 169 180    Cardiac EnzymesNo results for input(s): TROPONINI in the last 168 hours.  Recent Labs Lab 03/08/17 0926  TROPIPOC 0.00     BNP Recent Labs Lab 03/08/17 1701  BNP 646.4*     DDimer No results for input(s): DDIMER in the last 168 hours.   Radiology    No results found.  Cardiac Studies   Echocardiogram: 04/2015 Study Conclusions  - Left ventricle: The cavity size was normal. There was mild   concentric hypertrophy. Systolic function was normal. The   estimated ejection fraction was in the range of 60% to 65%. Wall   motion was normal; there were no regional wall motion   abnormalities. Features are consistent with a pseudonormal left   ventricular filling pattern, with concomitant abnormal relaxation   and increased filling pressure (grade 2 diastolic dysfunction). - Aortic valve: There was trivial regurgitation. - Mitral valve: There was moderate regurgitation. - Left atrium: The atrium was moderately  dilated. - Right atrium: The atrium was mildly dilated. - Tricuspid valve: There was moderate regurgitation. - Pulmonary arteries: Systolic pressure was severely increased. PA   peak pressure: 85 mm Hg (S).   Patient Profile     83 y.o. female with PMH of CAD (s/p CABG in 2006, repeat cath in 2016 with medical therapy recommended), paroxysmal atrial fibrillation (s/p DCCV in 2016; Previously on Eliquis but discontinued 2ry to GI bleed), chronic diastolic CHF, HTN, HLD, and Stage 3 CKD III [facility] for  lightheadedness, palpitations and chest painfound to be in atrial fibrillation with RVR.   Assessment & Plan    1.  Paroxysmal atrial fibrillation - she was initially on IV Cardizem but this has been transition to PO Cardizem 30mg  Q6H. Will switch to Cardizem CD 180mg  daily.  - This patients CHA2DS2-VASc Score and unadjusted Ischemic Stroke Rate (% per year) is equal to 7.2 % stroke rate/year from a score of 5 (HTN, Female, Age (2), Vascular). Has been started on Coumadin with Heparin bridge.  - TSH and electrolytes have been within normal limits. She is scheduled for a TEE-guided DCCV tomorrow with Dr. Aundra Dubin. Will need to be NPO after midnight.   2. Hypertension - BP has been well-controlled at 112/65 - 122/79 within the past 24 hours.  - continue current medication regimen with Cardizem adjustments as above.   3. CKD stage III - Variable creatinines in the past (estimated baseline creatinine is 1.5).  - slightly elevated to 1.71 this AM. Repeat BMET in AM.   4. Chronic diastolic heart failure - EF 60-65% by echo in 2016. - she does not appear volume overloaded by physical examination. Reports taking Lasix 20mg  daily and "never PRN as her legs swell". Continue with current dosing at this time.   5. Coronary artery disease - s/p CABG. Cath from 08/2014 showed an atretic LIMA but good distal flow via the other conduits.  - she denies any recent chest  pain. No plans for further ischemic evaluation at this time.   6. HLD - continue PTA Zocor.   7. Anemia - Hgb variable from 11.0- 13.0 this admission.  - she denies any evidence of active bleeding. Monitor closely with initiation of Coumadin and Heparin bridge.   Signed, Erma Heritage , PA-C 11:18 AM 03/10/2017 Pager: [phone]

## 2017-03-10 NOTE — Progress Notes (Signed)
ANTICOAGULATION CONSULT NOTE - Follow Up Consult  Pharmacy Consult for Heparin  Indication: atrial fibrillation  Allergies  Allergen Reactions  . Atorvastatin Other (See Comments)    Myalgias in legs  . Crestor [Rosuvastatin] Other (See Comments)    Myalgias in legs  . Morphine And Related Other (See Comments)    "Crazy thoughts, severe headache, sick"  . Prednisone     On two occasions has caused A fib    Patient Measurements: Height: 5\' 3"  (160 cm) Weight: 134 lb 1.6 oz (60.8 kg) IBW/kg (Calculated) : 52.4  Vital Signs: Temp: 98.2 F (36.8 C) (08/15 0439) Temp Source: Oral (08/14 1900) BP: 112/78 (08/15 0448) Pulse Rate: 95 (08/15 0439)  Labs:  Recent Labs  03/08/17 0917 03/08/17 1925 03/09/17 0206 03/10/17 0405  HGB 13.1  --  11.9* 11.9*  HCT 38.7  --  35.5* 35.6*  PLT 177  --  169 180  LABPROT  --   --  13.4 15.8*  INR  --   --  1.02 1.25  HEPARINUNFRC  --  0.41 0.56 0.88*  CREATININE 1.54*  --  1.75*  --     Estimated Creatinine Clearance: 19.8 mL/min (A) (by C-G formula based on SCr of 1.75 mg/dL (H)).   Assessment: Heparin/warfarin overlap for afib. Heparin level has trended up above therapeutic range. No issues per RN.   Goal of Therapy:  Heparin level 0.3-0.7 units/ml Monitor platelets by anticoagulation protocol: Yes   Plan:  -Dec heparin to 700 units/hr -1400 HL  Narda Bonds 03/10/2017,5:53 AM

## 2017-03-10 NOTE — Progress Notes (Signed)
ANTICOAGULATION CONSULT NOTE   Pharmacy Consult:  Coumadin Indication: atrial fibrillation  Allergies  Allergen Reactions  . Atorvastatin Other (See Comments)    Myalgias in legs  . Crestor [Rosuvastatin] Other (See Comments)    Myalgias in legs  . Morphine And Related Other (See Comments)    "Crazy thoughts, severe headache, sick"  . Prednisone     On two occasions has caused A fib    Patient Measurements: Height: 5\' 3"  (160 cm) Weight: 134 lb 1.6 oz (60.8 kg) IBW/kg (Calculated) : 52.4 Heparin Dosing Weight: 60 kg  Vital Signs: Temp: 98.1 F (36.7 C) (08/15 1300) Temp Source: Oral (08/15 1300) BP: 105/75 (08/15 1300) Pulse Rate: 93 (08/15 1200)  Labs:  Recent Labs  03/08/17 0917 03/08/17 1925 03/09/17 0206 03/10/17 0405  HGB 13.1  --  11.9* 11.9*  HCT 38.7  --  35.5* 35.6*  PLT 177  --  169 180  LABPROT  --   --  13.4 15.8*  INR  --   --  1.02 1.25  HEPARINUNFRC  --  0.41 0.56 0.88*  CREATININE 1.54*  --  1.75* 1.71*    Estimated Creatinine Clearance: 20.3 mL/min (A) (by C-G formula based on SCr of 1.71 mg/dL (H)).   Assessment: 58 YOF with history of Afib on Xarelto previously; however, patient was taken off of anticoagulation due to GIB.  Coumadin started this admission because it can be reserved easily.  Currently on IV heparin bridge to Coumadin.  Heparin level was supra-therapeutic this AM and rate was adjusted.  INR is sub-therapeutic as expected.  No bleeding reported.   Goal of Therapy:  Heparin level 0.3-0.7 units/ml  INR 2-3 Monitor platelets by anticoagulation protocol: Yes    Plan:  Coumadin 5mg  PO today Continue heparin gtt at 700 units/hr Await 1400 heparin level Daily heparin level, PT / INR, CBC    Coltin Casher D. Mina Marble, PharmD, BCPS Pager:  [phone] 03/10/2017, 2:44 PM

## 2017-03-10 NOTE — Consult Note (Signed)
[facility] CM Primary Care Navigator  03/10/2017  Quinlynn D Shaheen 09/24/1930 [id number]   Met with patient at the bedside to identify possible discharge needs. Patientreports having "chest tightness" and "chest pain" thatled to this admission. Patient endorses Dr[facility] William Hensel with [facility] as theprimary care provider.   [facility] [address] [facility].   Patientstates managingher ownmedications at home using "pill box"systemfilled every 2 weeks.   Patient reports being able to drive prior to admission. Her daughter in-law (Amy - lives behind her house) will providetransportation to herdoctors'appointments if needed after discharge.  Patient lives alone and independent with self-care. She verbalized that her daughter in-law or sister in-law (Evava - lives next door) will provide assistance with care at home when needed.  Anticipated discharge plan is home according to patient.  Patientvoiced understanding to call primary care provider's office for a post discharge follow-up appointment within a week or sooner if needs arise.Patient letter (with PCP's contact number) was provided as areminder.  Explained to patient about [facility] CM services available for health management at home.  She states that she is able to manage her chronic illness (HF) with daily weight monitoring/ recording, diet, medication, exercise and follow-up with primary care provider when needed. Patient declined EMMI calls to follow her up at home.  Patient expressed understanding to seekreferralfrom primary care provider to [facility] care management services in the future for further management.  [facility] care management information provided for future needs that may arise.  For questions, please contact:   , BSN, RN- BC Primary Care Navigator  Telephone: [phone] [facility] 

## 2017-03-11 ENCOUNTER — Encounter (HOSPITAL_COMMUNITY)
Admission: EM | Disposition: A | Discharge status: Home / Self Care | Payer: Self-pay | Source: Home / Self Care | Attending: Internal Medicine

## 2017-03-11 ENCOUNTER — Inpatient Hospital Stay (HOSPITAL_COMMUNITY): Payer: PPO

## 2017-03-11 LAB — BASIC METABOLIC PANEL
Anion gap: 8 (ref 5–15)
BUN: 29 mg/dL — ABNORMAL HIGH (ref 6–20)
CO2: 26 mmol/L (ref 22–32)
Calcium: 9.4 mg/dL (ref 8.9–10.3)
Chloride: 105 mmol/L (ref 101–111)
Creatinine, Ser: 1.69 mg/dL — ABNORMAL HIGH (ref 0.44–1.00)
GFR calc Af Amer: 31 mL/min — ABNORMAL LOW (ref >60)
GFR calc non Af Amer: 27 mL/min — ABNORMAL LOW (ref >60)
Glucose, Bld: 108 mg/dL — ABNORMAL HIGH (ref 65–99)
Potassium: 4.5 mmol/L (ref 3.5–5.1)
Sodium: 139 mmol/L (ref 135–145)

## 2017-03-11 LAB — CBC
HCT: 34.6 % — ABNORMAL LOW (ref 36.0–46.0)
Hemoglobin: 11.7 g/dL — ABNORMAL LOW (ref 12.0–15.0)
MCH: 30.6 pg (ref 26.0–34.0)
MCHC: 33.8 g/dL (ref 30.0–36.0)
MCV: 90.6 fL (ref 78.0–100.0)
Platelets: 163 10*3/uL (ref 150–400)
RBC: 3.82 MIL/uL — ABNORMAL LOW (ref 3.87–5.11)
RDW: 13.9 % (ref 11.5–15.5)
WBC: 5 10*3/uL (ref 4.0–10.5)

## 2017-03-11 LAB — HEPARIN LEVEL (UNFRACTIONATED): Heparin Unfractionated: 0.39 IU/mL (ref 0.30–0.70)

## 2017-03-11 LAB — PROTIME-INR
INR: 1.67
PROTHROMBIN TIME: 19.9 s — AB (ref 11.4–15.2)

## 2017-03-11 SURGERY — ECHOCARDIOGRAM, TRANSESOPHAGEAL
Anesthesia: Monitor Anesthesia Care

## 2017-03-11 MED ORDER — APIXABAN 2.5 MG PO TABS: 2.5000 mg | ORAL_TABLET | Freq: Two times a day (BID) | ORAL | Status: DC

## 2017-03-11 MED ORDER — METOPROLOL SUCCINATE ER 25 MG PO TB24: 25.0000 mg | ORAL_TABLET | Freq: Every day | ORAL | Status: DC

## 2017-03-11 MED ORDER — WARFARIN SODIUM 4 MG PO TABS: 4.0000 mg | ORAL_TABLET | Freq: Once | ORAL | Status: DC

## 2017-03-11 MED ORDER — APIXABAN 2.5 MG PO TABS: 2.5000 mg | ORAL_TABLET | Freq: Two times a day (BID) | ORAL | 0 refills | Status: DC

## 2017-03-11 MED ORDER — APIXABAN 2.5 MG PO TABS: 2.5000 mg | ORAL_TABLET | Freq: Two times a day (BID) | ORAL | 11 refills | Status: DC

## 2017-03-11 MED ORDER — METOPROLOL SUCCINATE ER 25 MG PO TB24: 25.0000 mg | ORAL_TABLET | Freq: Every day | ORAL | 3 refills | Status: DC

## 2017-03-11 NOTE — Progress Notes (Signed)
ANTICOAGULATION CONSULT NOTE   Pharmacy Consult:  Heparin / Coumadin Indication: atrial fibrillation  Allergies  Allergen Reactions  . Atorvastatin Other (See Comments)    Myalgias in legs  . Crestor [Rosuvastatin] Other (See Comments)    Myalgias in legs  . Morphine And Related Other (See Comments)    "Crazy thoughts, severe headache, sick"  . Prednisone     On two occasions has caused A fib    Patient Measurements: Height: 5\' 3"  (160 cm) Weight: 134 lb 14.4 oz (61.2 kg) IBW/kg (Calculated) : 52.4 Heparin Dosing Weight: 60 kg  Vital Signs: Temp: 98.1 F (36.7 C) (08/16 0457) Temp Source: Oral (08/16 0457) BP: 151/91 (08/16 0457) Pulse Rate: 72 (08/16 0457)  Labs:  Recent Labs  03/09/17 0206 03/10/17 0405 03/10/17 1446 03/11/17 0308  HGB 11.9* 11.9*  --  11.7*  HCT 35.5* 35.6*  --  34.6*  PLT 169 180  --  163  LABPROT 13.4 15.8*  --  19.9*  INR 1.02 1.25  --  1.67  HEPARINUNFRC 0.56 0.88* 0.56 0.39  CREATININE 1.75* 1.71*  --  1.69*    Estimated Creatinine Clearance: 20.5 mL/min (A) (by C-G formula based on SCr of 1.69 mg/dL (H)).   Assessment: 59 YOF with history of Afib on Xarelto previously; however, patient was taken off of anticoagulation due to GIB.  Coumadin started this admission because it can be reserved easily.  Currently on IV heparin bridge to Coumadin.   Heparin level is therapeutic and INR is trending up nicely.  No bleeding reported.   Goal of Therapy:  Heparin level 0.3-0.7 units/ml  INR 2-3 Monitor platelets by anticoagulation protocol: Yes    Plan:  Coumadin 4mg  PO today Continue heparin gtt at 700 units/hr Daily heparin level, PT / INR, CBC    Everlee Quakenbush D. Mina Marble, PharmD, BCPS Pager:  [phone] 03/11/2017, 8:11 AM

## 2017-03-11 NOTE — Discharge Summary (Signed)
Discharge Summary    Patient ID: Jacqueline [name],  MRN: [id number], DOB/AGE: 1930/09/14 83 y.o.  Admit date: 03/08/2017 Discharge date: 03/11/2017  Primary Care Provider: Madison Hickman A Primary Cardiologist: Dr. [name]  Discharge Diagnoses    Principal Problem:   Atrial fibrillation with RVR [facility]) Active Problems:   HYPERTENSION, BENIGN SYSTEMIC   CKD (chronic kidney disease) stage 3, GFR 30-59 ml/min   CAD- s/p CABG   History of Present Illness     Jacqueline [name] is a 83 y.o. female with PMHof CAD (s/p CABG in 2006, repeat cath in 2016 with medical therapy recommended), paroxysmal atrial fibrillation (s/p DCCV in 2016; Previously on Eliquis but discontinued 2ry to GI bleed), chronicdiastolic CHF,HTN, HLD, and Stage 3 CKD III [facility] forlightheadedness, palpitations and chest pain.   Upon arrival to the ED, she was found to be in atrial fibrillation with RVR, HR in the 150's. She was started on IV Cardizem with improvement in her HR. Electrolytes were within normal limits along with TSH. She was hesitant to start a NOAC due to cost, therefore she was initially placed on Coumadin with a Heparin bridge with anticipation of a TEE-guided DCCV later in admission.   Hospital Course     Consultants: None  The following morning, she reported continued fatigue and occasional palpitations. Her TEE-DCCV was scheduled for 03/11/2017.  On the morning of her scheduled cardioversion, she spontaneously converted to NSR prior to any procedures being performed. An EKG was obtained to confirm this. She was initially going to be discharged on Coumadin with a Lovenox bridge but was planning to vacation at the beach the following week and would be unable to have an INR checked. Her son who is in insurance checked her benefits and found that Eliquis was covered with a reasonable co-pay. Therefore, she was started on Eliquis 5mg  BID for anticoagulation with a 30-day  card being provided. Cardizem was switched to Toprol-XL 25mg  daily as she is also on Simvastatin. She has scheduled follow-up with Dr. [name] and it can be discussed at that time if she wishes to continue on Eliquis or switch to Coumadin with a Lovenox bridge. She was last examined by Dr. Debara Pickett and deemed stable for discharge.   _____________  Discharge Vitals Blood pressure (!) 151/91, pulse 72, temperature 98.1 F (36.7 C), temperature source Oral, resp. rate 18, height 5\' 3"  (1.6 m), weight 134 lb 14.4 oz (61.2 kg), SpO2 96 %.  Filed Weights   03/09/17 0507 03/10/17 0439 03/11/17 0457  Weight: 135 lb 6.4 oz (61.4 kg) 134 lb 1.6 oz (60.8 kg) 134 lb 14.4 oz (61.2 kg)    Labs & Radiologic Studies     CBC  Recent Labs  03/10/17 0405 03/11/17 0308  WBC 5.3 5.0  HGB 11.9* 11.7*  HCT 35.6* 34.6*  MCV 90.8 90.6  PLT 180 027   Basic Metabolic Panel  Recent Labs  03/10/17 0405 03/11/17 0308  NA 140 139  K 4.3 4.5  CL 106 105  CO2 24 26  GLUCOSE 104* 108*  BUN 28* 29*  CREATININE 1.71* 1.69*  CALCIUM 9.6 9.4   Liver Function Tests No results for input(s): AST, ALT, ALKPHOS, BILITOT, PROT, ALBUMIN in the last 72 hours. No results for input(s): LIPASE, AMYLASE in the last 72 hours. Cardiac Enzymes No results for input(s): CKTOTAL, CKMB, CKMBINDEX, TROPONINI in the last 72 hours. BNP Invalid input(s): POCBNP D-Dimer No results for  input(s): DDIMER in the last 72 hours. Hemoglobin A1C No results for input(s): HGBA1C in the last 72 hours. Fasting Lipid Panel No results for input(s): CHOL, HDL, LDLCALC, TRIG, CHOLHDL, LDLDIRECT in the last 72 hours. Thyroid Function Tests No results for input(s): TSH, T4TOTAL, T3FREE, THYROIDAB in the last 72 hours.  Invalid input(s): FREET3  Dg Chest Portable 1 View  Result Date: 03/08/2017 CLINICAL DATA:  Tachycardia EXAM: PORTABLE CHEST 1 VIEW COMPARISON:  08/14/2015 FINDINGS: Normal heart size and stable mediastinal contours.  Status post CABG. No acute infiltrate or edema. No effusion or pneumothorax. Bilateral glenohumeral osteoarthritis. No acute osseous findings. IMPRESSION: No evidence for active disease. Electronically Signed   By: Monte Fantasia M.D.   On: 03/08/2017 09:33    Diagnostic Studies/Procedures    None Performed.   Disposition   Pt is being discharged home today in good condition.  Follow-up Plans & Appointments    Follow-up Information    Croitoru, Mihai, MD Follow up.   Specialty:  Cardiology Why:  Cardiology Follow-Up on 04/02/2017 at 9:20AM.  Contact information: [address] [place] [address] [phone]          Discharge Instructions    Diet - low sodium heart healthy    Complete by:  As directed       Discharge Medications     Medication List    STOP taking these medications   aspirin EC 81 MG tablet     TAKE these medications   acetaminophen 500 MG tablet Commonly known as:  TYLENOL Take 500-1,000 mg by mouth every 6 (six) hours as needed for moderate pain or headache.   ALPRAZolam 0.5 MG tablet Commonly known as:  XANAX TAKE 1 TABLET BY MOUTH TWICE A DAY AS NEEDED FOR ANXIETY   apixaban 2.5 MG Tabs tablet Commonly known as:  ELIQUIS Take 1 tablet (2.5 mg total) by mouth 2 (two) times daily.   diphenhydrAMINE 25 MG tablet Commonly known as:  BENADRYL Take 25 mg by mouth at bedtime.   diphenhydramine-acetaminophen 25-500 MG Tabs tablet Commonly known as:  TYLENOL PM Take 1 tablet by mouth at bedtime.   Fish Oil 1000 MG Caps Take 1-2 capsules by mouth 2 (two) times daily. Takes one in the morning and two capsules at night.   furosemide 20 MG tablet Commonly known as:  LASIX TAKE 1 TABLET EVERY DAY   gabapentin 100 MG capsule Commonly known as:  NEURONTIN Take 1 capsule (100 mg total) by mouth 3 (three) times daily.   GERITOL COMPLETE PO Take 1 tablet by mouth daily.   hydrALAZINE 25 MG tablet Commonly known as:   APRESOLINE TAKE 1/2 TABLET (12.5 MG TOTAL) BY MOUTH 3 TIMES DAILY. PLEASE MAKE APPT FOR REFILLS. What changed:  See the new instructions.   isosorbide mononitrate 30 MG 24 hr tablet Commonly known as:  IMDUR TAKE 1 TABLET BY MOUTH EVERY DAY NEEDS APPT FOR REFILLS   metoprolol succinate 25 MG 24 hr tablet Commonly known as:  TOPROL-XL Take 1 tablet (25 mg total) by mouth daily.   NITROSTAT 0.4 MG SL tablet Generic drug:  nitroGLYCERIN PLACE 1 TABLET (0.4 MG TOTAL) UNDER THE TONGUE EVERY 5 (FIVE) MINUTES AS NEEDED FOR CHEST PAIN.   omeprazole 20 MG capsule Commonly known as:  PRILOSEC TAKE ONE CAPSULE BY MOUTH EVERY DAY   polyethylene glycol powder powder Commonly known as:  GLYCOLAX/MIRALAX MIX 17 G AND TAKE BY MOUTH DAILY. What changed:  See the new  instructions.   rOPINIRole 0.25 MG tablet Commonly known as:  REQUIP TAKE 1 TABLET BY MOUTH 3 TIMES A DAY What changed:  See the new instructions.   simvastatin 40 MG tablet Commonly known as:  ZOCOR TAKE 1/2 A TABLET BY MOUTH AT BEDTIME. What changed:  See the new instructions.   traMADol 50 MG tablet Commonly known as:  ULTRAM TAKE 1 TABLET BY MOUTH TWICE A DAY AS NEEDED FOR PAIN   traZODone 100 MG tablet Commonly known as:  DESYREL TAKE 1 TABLET (100 MG TOTAL) BY MOUTH AT BEDTIME.          Allergies Allergies  Allergen Reactions  . Atorvastatin Other (See Comments)    Myalgias in legs  . Crestor [Rosuvastatin] Other (See Comments)    Myalgias in legs  . Morphine And Related Other (See Comments)    "Crazy thoughts, severe headache, sick"  . Prednisone     On two occasions has caused A fib     Outstanding Labs/Studies   BMET at follow-up.  Duration of Discharge Encounter   Greater than 30 minutes including physician time.  Signed, Erma Heritage, PA-C 03/11/2017, 5:38 PM

## 2017-03-11 NOTE — Care Management Note (Addendum)
Case Management Note  Patient Details  Name: Jacqueline [name] MRN: [id number] Date of Birth: 03/16/1931  Subjective/Objective:  Pt presented for Atrial Fib-Plan for home with Lovenox. Benefits Check Completed: Pt is aware of cost. [facility] [address] and [facility]. Pt is declining [place] @ this time.                 Action/Plan:  S/W SHARONDA   @  [address] # [phone] OPT- 2    1. LOVENOX 60 MG SQ ONCE DAILY 2 SYRINGES   COVER- NONE  FORMULARY  PRIOR APPROVAL- YES # [phone]   2. ENOXAPARIN 60 MG SQ ONCE DAILY 2 SYRINGES   COVER- YES  CO-PAY- $ 35.69  TIER- 4 DRUG  PRIOR APPROVAL- NO   [facility]   Expected Discharge Date:                  Expected Discharge Plan:  Home/Self Care  In-House Referral:  NA  Discharge planning Services  CM Consult, Medication Assistance  Post Acute Care Choice:  NA Choice offered to:  NA  DME Arranged:  N/A DME Agency:  NA  HH Arranged:  NA HH Agency:  NA  Status of Service:  Completed, signed off  If discussed at [place] of Stay Meetings, dates discussed:    Additional Comments: 1459 03-11-17 Jacqlyn Krauss, RN,BSN [phone] Plans changed pt will now need Eliquis for home. Pt received 30 day free Eliquis Card. Pharmacy will have to provide cost of medication once Rx is sent. No further needs from CM at this time.  Bethena Roys, RN 03/11/2017, 2:33 PM

## 2017-03-11 NOTE — Progress Notes (Signed)
Eliquis 30 day free card given to patient as well as both prescriptions for her to fill at her pharmacy. Pt agrees and understands.

## 2017-03-11 NOTE — Progress Notes (Signed)
Progress Note  Patient Name: Jacqueline [name] Date of Encounter: 03/11/2017  Primary Cardiologist: Dr. [name]  Subjective   Denies any chest pain or palpitations this morning. Was in atrial fibrillation and scheduled for a TEE/DCCV today but she converted to NSR around 0800.  Inpatient Medications    Scheduled Meds: . diltiazem  180 mg Oral Daily  . furosemide  20 mg Oral Daily  . gabapentin  100 mg Oral TID  . isosorbide mononitrate  30 mg Oral Daily  . rOPINIRole  0.25 mg Oral QHS  . simvastatin  40 mg Oral q1800  . sodium chloride flush  3 mL Intravenous Q12H  . traZODone  100 mg Oral QHS  . Warfarin - Pharmacist Dosing Inpatient   Does not apply q1800   Continuous Infusions: . sodium chloride    . heparin 700 Units/hr (03/11/17 0316)   PRN Meds: acetaminophen, ALPRAZolam, ondansetron (ZOFRAN) IV, sodium chloride flush   Vital Signs    Vitals:   03/10/17 1200 03/10/17 1300 03/10/17 1946 03/11/17 0457  BP: (!) 135/96 105/75 112/81 (!) 151/91  Pulse: 93  87 72  Resp: 16 (!) 22 20 18   Temp:  98.1 F (36.7 C) 98 F (36.7 C) 98.1 F (36.7 C)  TempSrc:  Oral Oral Oral  SpO2: 100% 100% 95% 96%  Weight:    134 lb 14.4 oz (61.2 kg)  Height:        Intake/Output Summary (Last 24 hours) at 03/11/17 0805 Last data filed at 03/11/17 0502  Gross per 24 hour  Intake          1161.87 ml  Output             1100 ml  Net            61.87 ml   Filed Weights   03/09/17 0507 03/10/17 0439 03/11/17 0457  Weight: 135 lb 6.4 oz (61.4 kg) 134 lb 1.6 oz (60.8 kg) 134 lb 14.4 oz (61.2 kg)    Telemetry    Atrial fibrillation until 0800. Converted to NSR at that time with HR in the 70's - 80's with PAC's. - Personally Reviewed  ECG    No new tracings.   Physical Exam   General: Well developed, well nourished female appearing in no acute distress. Head: Normocephalic, atraumatic.  Neck: Supple without bruits, JVD not elevated. Lungs:  Resp regular and unlabored, CTA  with no wheezing or rales appreciated. Heart: RRR, S1, S2, no S3, S4, or murmur; no rub. Abdomen: Soft, non-tender, non-distended with normoactive bowel sounds. No hepatomegaly. No rebound/guarding. No obvious abdominal masses. Extremities: No clubbing, cyanosis, or lower extremity edema. Distal pedal pulses are 2+ bilaterally. Neuro: Alert and oriented X 3. Moves all extremities spontaneously. Psych: Normal affect.  Labs    Chemistry Recent Labs Lab 03/09/17 0206 03/10/17 0405 03/11/17 0308  NA 138 140 139  K 3.3* 4.3 4.5  CL 104 106 105  CO2 25 24 26   GLUCOSE 110* 104* 108*  BUN 25* 28* 29*  CREATININE 1.75* 1.71* 1.69*  CALCIUM 9.1 9.6 9.4  GFRNONAA 26* 26* 27*  GFRAA 30* 30* 31*  ANIONGAP 9 10 8      Hematology Recent Labs Lab 03/09/17 0206 03/10/17 0405 03/11/17 0308  WBC 6.2 5.3 5.0  RBC 3.95 3.92 3.82*  HGB 11.9* 11.9* 11.7*  HCT 35.5* 35.6* 34.6*  MCV 89.9 90.8 90.6  MCH 30.1 30.4 30.6  MCHC 33.5 33.4 33.8  RDW 13.7 13.8 13.9  PLT 169 180 163    Cardiac EnzymesNo results for input(s): TROPONINI in the last 168 hours.  Recent Labs Lab 03/08/17 0926  TROPIPOC 0.00     BNP Recent Labs Lab 03/08/17 1701  BNP 646.4*     DDimer No results for input(s): DDIMER in the last 168 hours.   Radiology    No results found.  Cardiac Studies   Echocardiogram: 04/2015 Study Conclusions  - Left ventricle: The cavity size was normal. There was mild concentric hypertrophy. Systolic function was normal. The estimated ejection fraction was in the range of 60% to 65%. Wall motion was normal; there were no regional wall motion abnormalities. Features are consistent with a pseudonormal left ventricular filling pattern, with concomitant abnormal relaxation and increased filling pressure (grade 2 diastolic dysfunction). - Aortic valve: There was trivial regurgitation. - Mitral valve: There was moderate regurgitation. - Left atrium: The atrium was  moderately dilated. - Right atrium: The atrium was mildly dilated. - Tricuspid valve: There was moderate regurgitation. - Pulmonary arteries: Systolic pressure was severely increased. PA peak pressure: 85 mm Hg (S).  Patient Profile     83 y.o. female with PMH of CAD (s/p CABG in 2006, repeat cath in 2016 with medical therapy recommended), paroxysmal atrial fibrillation (s/p DCCV in 2016; Previously on Eliquis but discontinued 2ry to GI bleed), chronic diastolic CHF, HTN, HLD, and Stage 3 CKD III [facility] for  lightheadedness, palpitations and chest painfound to be in atrial fibrillation with RVR.   Assessment & Plan    1.  Paroxysmal atrial fibrillation - she was initially on IV Cardizem but this has been transition to PO Cardizem 30mg  Q6H. Will switch to Cardizem CD 180mg  daily.  - This patients CHA2DS2-VASc Score and unadjusted Ischemic Stroke Rate (% per year) is equal to 7.2 % stroke rate/year from a score of 5 (HTN, Female, Age (2), Vascular). Has been started on Coumadin with Heparin bridge. Will consult Case Management for benefits check of Lovenox injections as INR remains subtherapeutic at 1.67 today but she denies any history of CVA's, TIA's, PE, or DVT.  - TSH and electrolytes have been within normal limits. She was initially scheduled for a TEE-guided DCCV later today with Dr. Aundra Dubin but spontaneously converted to NSR around 0800. Will obtain EKG to confirm. Likely discharge later today.  2. Hypertension - BP has been well-controlled at 105/75 - 151/96 within the past 24 hours.  - continue current medication regimen.  3. CKD stage III - Variable creatinines in the past (estimated baseline creatinine is 1.5).  - slightly elevated to 1.71 this AM. Repeat BMET in AM.   4. Chronic diastolic heart failure - EF 60-65% by echo in 2016. - she does not appear volume overloaded by physical examination.  - continue with PTA Lasix dosing of 20mg  daily.    5. Coronary artery disease - s/p CABG. Cath from 08/2014 showed an atretic LIMA but good distal flow via the other conduits.  - she denies any recent chest pain. No plans for further ischemic evaluation at this time.   6. HLD - continue PTA Zocor.   7. Anemia - Hgb variable from 11.0- 13.0 this admission. At 11.7 this AM.  - continue to monitor closely with recent initiation of anticoagulation.    Signed, Erma Heritage , PA-C 8:05 AM 03/11/2017 Pager: [phone]

## 2017-03-18 ENCOUNTER — Ambulatory Visit: Payer: PPO | Admitting: Cardiovascular Disease

## 2017-03-24 ENCOUNTER — Encounter: Payer: Self-pay | Admitting: Family Medicine

## 2017-03-24 ENCOUNTER — Ambulatory Visit (INDEPENDENT_AMBULATORY_CARE_PROVIDER_SITE_OTHER): Payer: PPO | Admitting: Family Medicine

## 2017-03-24 DIAGNOSIS — Z23 Encounter for immunization: Secondary | ICD-10-CM

## 2017-03-24 DIAGNOSIS — M150 Primary generalized (osteo)arthritis: Secondary | ICD-10-CM | POA: Diagnosis not present

## 2017-03-24 DIAGNOSIS — I480 Paroxysmal atrial fibrillation: Secondary | ICD-10-CM

## 2017-03-24 DIAGNOSIS — I2510 Atherosclerotic heart disease of native coronary artery without angina pectoris: Secondary | ICD-10-CM | POA: Diagnosis not present

## 2017-03-24 DIAGNOSIS — I10 Essential (primary) hypertension: Secondary | ICD-10-CM | POA: Diagnosis not present

## 2017-03-24 DIAGNOSIS — I5032 Chronic diastolic (congestive) heart failure: Secondary | ICD-10-CM

## 2017-03-24 DIAGNOSIS — M159 Polyosteoarthritis, unspecified: Secondary | ICD-10-CM

## 2017-03-24 NOTE — Patient Instructions (Signed)
For your back pain, I want you to try the following schedule for 2 weeks.  Breakfast: Tramadol, gabapentin, one tylenol Lunch: one tylenol Dinner; one tylenol and one gabapentin Bedtime: your tylenol PM, two gabapentin and on tramadol.  Call me and let me know how that works.

## 2017-03-26 NOTE — Assessment & Plan Note (Signed)
Stable on current meds 

## 2017-03-26 NOTE — Assessment & Plan Note (Signed)
Stable on current meds.  No change. 

## 2017-03-26 NOTE — Assessment & Plan Note (Signed)
Stable and recent CBC OK.

## 2017-03-26 NOTE — Progress Notes (Signed)
   Subjective:    Patient ID: Jacqueline [name], female    DOB: March 15, 1931, 83 y.o.   MRN: [id number]  HPI  FU hospitalization for a spell of PAF Admitted by cards, cardioverted, meds adjusted by them.  Med list is up to date.  DC summary reviewed.  No particular follow up issues.  She is asymptomatic and just wonders how to prevent such issues moving forward.  Her main complain is back pain.  Has know osteoarthriits and DDD of lumbar spine.  She is taking tramadol, tylenol and gabapentin with suboptimal relief.  Currently taking too much tylenol in the morning (two 650 tabs) and no pain meds throughout the day.  Has CKD, previous GI blood loss and on anticoag so NSAIDs are contraindicated.  CHF.  No dyspnea or leg selling.    HBP: asymptomatic on current meds  CAD.  Asymptomatic on current meds.    Review of Systems     Objective:   Physical ExamVS noted Lungs clear Cardiac RRR without m or g No pedal edema Lumbar back.  Generalized pain along spine and parapinous muscles.        Assessment & Plan:

## 2017-03-26 NOTE — Assessment & Plan Note (Signed)
Created schedule for meds throughout the day.  Can escalate tramadol or gabapentin if this does not help.

## 2017-04-01 ENCOUNTER — Other Ambulatory Visit: Payer: Self-pay | Admitting: Cardiovascular Disease

## 2017-04-02 ENCOUNTER — Encounter: Payer: Self-pay | Admitting: Cardiovascular Disease

## 2017-04-02 ENCOUNTER — Telehealth: Payer: Self-pay | Admitting: *Deleted

## 2017-04-02 ENCOUNTER — Ambulatory Visit (INDEPENDENT_AMBULATORY_CARE_PROVIDER_SITE_OTHER): Payer: PPO | Admitting: Cardiovascular Disease

## 2017-04-02 VITALS — BP 140/70 | HR 64 | Ht 61.0 in | Wt 136.6 lb

## 2017-04-02 DIAGNOSIS — M545 Low back pain: Secondary | ICD-10-CM

## 2017-04-02 DIAGNOSIS — N183 Chronic kidney disease, stage 3 unspecified: Secondary | ICD-10-CM

## 2017-04-02 DIAGNOSIS — Z7901 Long term (current) use of anticoagulants: Secondary | ICD-10-CM | POA: Diagnosis not present

## 2017-04-02 DIAGNOSIS — G8929 Other chronic pain: Secondary | ICD-10-CM | POA: Diagnosis not present

## 2017-04-02 DIAGNOSIS — I5032 Chronic diastolic (congestive) heart failure: Secondary | ICD-10-CM | POA: Diagnosis not present

## 2017-04-02 DIAGNOSIS — I480 Paroxysmal atrial fibrillation: Secondary | ICD-10-CM | POA: Diagnosis not present

## 2017-04-02 DIAGNOSIS — I25118 Atherosclerotic heart disease of native coronary artery with other forms of angina pectoris: Secondary | ICD-10-CM

## 2017-04-02 MED ORDER — APIXABAN 2.5 MG PO TABS: 2.5000 mg | ORAL_TABLET | Freq: Two times a day (BID) | ORAL | 3 refills | Status: DC

## 2017-04-02 NOTE — Progress Notes (Signed)
Patient ID: Jacqueline [name], female   DOB: 1930/08/21, 83 y.o.   MRN: [id number]    Cardiology Office Note    Date:  04/04/2017   ID:  KEITA DEMARCO, DOB [birth year], MRN [id number]  PCP:  Jacqueline Resides, MD  Cardiologist:   Sanda Klein, MD   Chief Complaint  Patient presents with  . Follow-up    lower back pain, wonders if its kidneys     History of Present Illness:  Jacqueline [name] is a 83 y.o. female with coronary artery disease and previous bypass surgery, paroxysmal atrial fibrillation requiring previous cardioversion, diastolic heart failure with recent acute exacerbation, history of systemic hypertension.  She was hospitalized on August 13 with atrial fibrillation with rapid ventricular response (on my review check she had atrial flutter with 2:1 AV block). She spontaneously converted to atrial fibrillation with controlled ventricular response and subsequently to sinus rhythm on August 16. Anticoagulation which had been on hold due to previous GI bleeding was restarted. She is taking Eliquis 2.5 mg twice daily (dose adjusted for age and abnormal renal function creatinine 1.7). In the past she was on pindolol which had to be stopped due to low blood pressure, she is now tolerating metoprolol succinate 25 mg once daily.  She has known severe pulmonary artery hypertension. Right heart catheterization October 2016 showed a PA pressure of 77/28 (mean 49 mL Hg), explained at least in part by left heart failure with a mean wedge pressure of 27 mmHg (V wave 41 mmHg).  Coronary angiography (2016)showed atretic LIMA to LAD but patent SVG to diagonal and good distal flow. She has normal left ventricular systolic function. She previously underwent electrical cardioversion for atrial fibrillation in 2015, anticoagulation was stopped in 2016 following an episode of GI bleeding.  Baseline, she has NYHA functional class II shortness of breath on exertion, but functional status is limited primarily by back  pain. She wonders whether she could have kidney stones, but the pain appears to be positional and is most likely musculoskeletal. She has not been recently troubled by chest pain and has not taken nitroglycerin.  She continues to live independently and fiercely values her autonomy.    Past Medical History:  Diagnosis Date  . Arthritis    back  . Atrial fibrillation with rapid ventricular response ([place]) 02/2014   a. CHA2DS2VASc = 6 -> on eliquis;  b. 02/2014 s/p DCCV;  c. 08/2014 Echo: EF 55-60%, Gr 2 DD, mild MR, triv AI.  Marland Kitchen CAD (coronary artery disease)    a. s/p CABG x 2 (LIMA->LAD, VG->Diag);  b. 08/2014 Cath: LM nl, LAD 90p, LCX nl, RCA nl/dominant, LIMA->LAD atretic, VG->D2 patent w/ retrograde filling of LAD, EF 55-60%-->Med Rx.  . Diverticulitis 02/21/2012  . GERD (gastroesophageal reflux disease)   . Hyperlipemia   . Hypertension   . Insomnia     Past Surgical History:  Procedure Laterality Date  . ABDOMINAL HYSTERECTOMY  1975  . CARDIAC CATHETERIZATION N/A 05/09/2015   Procedure: Right Heart Cath;  Surgeon: Larey Dresser, MD;  Location: [place] [facility];  Service: Cardiovascular;  Laterality: N/A;  . CARDIOVERSION N/A 02/28/2014   Procedure: CARDIOVERSION;  Surgeon: Sanda Klein, MD;  Location: [place] [facility];  Service: Cardiovascular;  Laterality: N/A;  . CATARACT EXTRACTION Bilateral 2014   with lens implanted  . COLONOSCOPY N/A 02/19/2015   Procedure: COLONOSCOPY;  Surgeon: Ladene Artist, MD;  Location: [facility]COPY;  Service: Endoscopy;  Laterality: N/A;  . CORONARY  ARTERY BYPASS GRAFT  2006   off-pump bypass surgery with LIMA to the LAD and SVG to the second diagonal artery ( performed by Dr Servando Snare)   . JOINT REPLACEMENT Bilateral    L-2004, R-2006  . LEFT HEART CATHETERIZATION WITH CORONARY/GRAFT ANGIOGRAM N/A 09/17/2014   Procedure: LEFT HEART CATHETERIZATION WITH Beatrix Fetters;  Surgeon: Troy Sine, MD;  Location: [facility] CATH LAB;  Service:  Cardiovascular;  Laterality: N/A;  . REIMPLANTATION OF TOTAL KNEE Right 10/08/2014   Procedure: REIMPLANTATION OF RIGHT TOTAL KNEE ARTHROPLASTY WITH REMOVAL OF ANTIBIOTIC SPACER;  Surgeon: Paralee Cancel, MD;  Location: [facility] ORS;  Service: Orthopedics;  Laterality: Right;  . ROTATOR CUFF REPAIR Bilateral    r-1999, l- 2005  . TEE WITHOUT CARDIOVERSION N/A 02/28/2014   Procedure: TRANSESOPHAGEAL ECHOCARDIOGRAM (TEE);  Surgeon: Sanda Klein, MD;  Location: [facility];  Service: Cardiovascular;  Laterality: N/A;  . TOTAL KNEE ARTHROPLASTY Right 07/02/2014   Procedure: Resection of Infected Right Total Knee Arthroplasty with placement antibiotic spacer;  Surgeon: Mauri Pole, MD;  Location: [facility] ORS;  Service: Orthopedics;  Laterality: Right;    Outpatient Medications Prior to Visit  Medication Sig Dispense Refill  . acetaminophen (TYLENOL) 500 MG tablet Take 500-1,000 mg by mouth every 6 (six) hours as needed for moderate pain or headache.    . ALPRAZolam (XANAX) 0.5 MG tablet TAKE 1 TABLET BY MOUTH TWICE A DAY AS NEEDED FOR ANXIETY 60 tablet 5  . diphenhydrAMINE (BENADRYL) 25 MG tablet Take 25 mg by mouth at bedtime.     . diphenhydramine-acetaminophen (TYLENOL PM) 25-500 MG TABS tablet Take 1 tablet by mouth at bedtime.    . furosemide (LASIX) 20 MG tablet TAKE 1 TABLET BY MOUTH EVERY DAY 90 tablet 0  . gabapentin (NEURONTIN) 100 MG capsule Take 1 capsule (100 mg total) by mouth 3 (three) times daily. 270 capsule 3  . hydrALAZINE (APRESOLINE) 25 MG tablet TAKE 1/2 TABLET (12.5 MG TOTAL) BY MOUTH 3 TIMES DAILY. PLEASE MAKE APPT FOR REFILLS. (Patient taking differently: TAKE 12.5MG  BY MOUTH DAILY AS NEEDED FOR ELEVATED BLOOD PRESSURE) 90 tablet 1  . Iron-Vitamins (GERITOL COMPLETE PO) Take 1 tablet by mouth daily.    . isosorbide mononitrate (IMDUR) 30 MG 24 hr tablet TAKE 1 TABLET BY MOUTH EVERY DAY NEEDS APPT FOR REFILLS 30 tablet 6  . metoprolol succinate (TOPROL-XL) 25 MG 24 hr tablet Take 1  tablet (25 mg total) by mouth daily. 90 tablet 3  . NITROSTAT 0.4 MG SL tablet PLACE 1 TABLET (0.4 MG TOTAL) UNDER THE TONGUE EVERY 5 (FIVE) MINUTES AS NEEDED FOR CHEST PAIN. 25 tablet 1  . Omega-3 Fatty Acids (FISH OIL) 1000 MG CAPS Take 1-2 capsules by mouth 2 (two) times daily. Takes one in the morning and two capsules at night.    Marland Kitchen omeprazole (PRILOSEC) 20 MG capsule TAKE ONE CAPSULE BY MOUTH EVERY DAY 90 capsule 3  . polyethylene glycol powder (GLYCOLAX/MIRALAX) powder MIX 17 G AND TAKE BY MOUTH DAILY. (Patient taking differently: MIX 17 G AND TAKE BY MOUTH THREE TIMES WEEKLY) 527 g 3  . rOPINIRole (REQUIP) 0.25 MG tablet TAKE 1 TABLET BY MOUTH 3 TIMES A DAY (Patient taking differently: TAKE 0.25MG  BY MOUTH ONCE DAILY) 270 tablet 3  . simvastatin (ZOCOR) 40 MG tablet TAKE 1/2 A TABLET BY MOUTH AT BEDTIME. (Patient taking differently: TAKE 20MG  (1/2 TAB)  BY MOUTH AT BEDTIME.) 45 tablet 3  . traMADol (ULTRAM) 50 MG tablet TAKE 1 TABLET BY  MOUTH TWICE A DAY AS NEEDED FOR PAIN 60 tablet 5  . traZODone (DESYREL) 100 MG tablet TAKE 1 TABLET (100 MG TOTAL) BY MOUTH AT BEDTIME. 90 tablet 3  . apixaban (ELIQUIS) 2.5 MG TABS tablet Take 1 tablet (2.5 mg total) by mouth 2 (two) times daily. 60 tablet 11   No facility-administered medications prior to visit.      Allergies:   Atorvastatin; Crestor [rosuvastatin]; Morphine and related; and Prednisone   Social History   Social History  . Marital status: Widowed    Spouse name: N/A  . Number of children: N/A  . Years of education: N/A   Social History Main Topics  . Smoking status: Never Smoker  . Smokeless tobacco: Never Used  . Alcohol use No  . Drug use: No  . Sexual activity: No   Other Topics Concern  . None   Social History Narrative  . None     Family History:  The patient's family history includes Parkinson's disease in her mother.   ROS:   Please see the history of present illness.    ROS All other systems reviewed and are  negative.   PHYSICAL EXAM:   VS:  BP 140/70   Pulse 64   Ht 5\' 1"  (1.549 m)   Wt 136 lb 9.6 oz (62 kg)   BMI 25.81 kg/m     General: Alert, oriented x3, no distress Head: no evidence of trauma, PERRL, EOMI, no exophtalmos or lid lag, no myxedema, no xanthelasma; normal ears, nose and oropharynx Neck: normal jugular venous pulsations and no hepatojugular reflux; brisk carotid pulses without delay and no carotid bruits Chest: clear to auscultation, no signs of consolidation by percussion or palpation, normal fremitus, symmetrical and full respiratory excursions Cardiovascular: normal position and quality of the apical impulse, regular rhythm, normal first and second heart sounds, no murmurs, rubs or gallops Abdomen: no tenderness or distention, no masses by palpation, no abnormal pulsatility or arterial bruits, normal bowel sounds, no hepatosplenomegaly Extremities: no clubbing, cyanosis or edema; 2+ radial, ulnar and brachial pulses bilaterally; 2+ right femoral, posterior tibial and dorsalis pedis pulses; 2+ left femoral, posterior tibial and dorsalis pedis pulses; no subclavian or femoral bruits Neurological: grossly nonfocal Psych: euthymic mood, full affect  Wt Readings from Last 3 Encounters:  04/02/17 136 lb 9.6 oz (62 kg)  03/24/17 137 lb 6.4 oz (62.3 kg)  03/11/17 134 lb 14.4 oz (61.2 kg)      Studies/Labs Reviewed:   EKG:  EKG is not ordered today.  ECGs from her hospitalization are reviewed. August 13 shows atrial flutter with 2:1 AV block. Subsequent EKGs show atrial fib with heart rate in the 70s.  August 16 EKG shows normal sinus rhythm with a borderline prolonged PR interval at 214 ms and QTC of 454 ms, without ischemic repolarization abnormalities.  Recent Labs: 04/08/2016: ALT 15 03/08/2017: B Natriuretic Peptide 646.4; Magnesium 2.0; TSH 3.622 03/11/2017: BUN 29; Creatinine, Ser 1.69; Hemoglobin 11.7; Platelets 163; Potassium 4.5; Sodium 139   Lipid Panel      Component Value Date/Time   CHOL 149 04/08/2016 1153   TRIG 130 04/08/2016 1153   HDL 53 04/08/2016 1153   CHOLHDL 2.8 04/08/2016 1153   VLDL 26 04/08/2016 1153   LDLCALC 70 04/08/2016 1153    ASSESSMENT:     1. PAF (paroxysmal atrial fibrillation) ([place])   2. Long term current use of anticoagulant   3. Coronary artery disease of native artery of native heart  with stable angina pectoris ([place])   4. Chronic diastolic congestive heart failure, NYHA class 2 ([place])   5. CKD (chronic kidney disease) stage 3, GFR 30-59 ml/min   6. Chronic bilateral low back pain without sciatica      PLAN:  In order of problems listed above:  1. AFib/flutter: She may have had other episodes of atrial fibrillation that were asymptomatic since they were rate control. When in atrial flutter she had rapid ventricular rates, which led to her hospitalization. She seems to be tolerating low-dose beta blocker and anticoagulation so far. CHADSVasc 5 (age 83, gender, CAD, CHF) 2. Eliquis: Remote history of GI bleeding when on anticoagulation, but none recently  3. CAD: CCS functional class I-II angina pectoris on treatment with isosorbide. Now also on low-dose metoprolol. Hard to say whether her previous episodes of nitroglycerin responsive chest discomfort were related to coronary insufficiency or pulmonary hypertension. Recent lipid profile is favorable  4. CHF: Clinically she appears to be euvolemic. Diuretics should be adjusted cautiously due to chronic kidney disease. 5. CKD: need to avoid excessive diuresis due to risk of renal insufficiency worsening.  6. Back pain: Her PCP recommended a pain control regimen that includes tramadol, acetaminophen and gabapentin. She has not obtained any relief from this. She does not seem to have any somnolence, balance problems or other side effects from the tramadol. Will increase the dose to 100 mg in the morning and 100 mg at bedtime.  Medication Adjustments/Labs and Tests  Ordered: Current medicines are reviewed at length with the patient today.  Concerns regarding medicines are outlined above.  Medication changes, Labs and Tests ordered today are listed in the Patient Instructions below. Patient Instructions  Dr [name] recommends that you schedule a follow-up appointment in 3-4 months.  If you need a refill on your cardiac medications before your next appointment, please call your pharmacy.   OK to take 2 Tramadol tablets in the morning and again at night for back pain.      Signed, Sanda Klein, MD  04/04/2017 3:30 PM    [facility] [address], [place], [place]  [address] Phone: [phone]; Fax: [phone]

## 2017-04-02 NOTE — Patient Instructions (Addendum)
Dr [name] recommends that you schedule a follow-up appointment in 3-4 months.  If you need a refill on your cardiac medications before your next appointment, please call your pharmacy.   OK to take 2 Tramadol tablets in the morning and again at night for back pain.

## 2017-04-02 NOTE — Telephone Encounter (Signed)
Called.  Confirmed that still no urgency, frequency, dysuria or fever.  Not too sleepy on current med schedule.  Will alter to take two gabapentin tid.  Tylenol and tramadol will remain unchanged for now.

## 2017-04-02 NOTE — Telephone Encounter (Signed)
Patient left message on nurse line stating that MD told her to call if medication given for back pain wasn't helping. Patient states that it hasnt helped at all and she is having a lot of right sided lower back pain. Patient requesting to speak with PCP.

## 2017-04-07 ENCOUNTER — Other Ambulatory Visit: Payer: Self-pay | Admitting: Student

## 2017-04-10 ENCOUNTER — Other Ambulatory Visit: Payer: Self-pay | Admitting: Family Medicine

## 2017-04-10 DIAGNOSIS — G609 Hereditary and idiopathic neuropathy, unspecified: Secondary | ICD-10-CM

## 2017-04-12 ENCOUNTER — Ambulatory Visit: Payer: PPO

## 2017-04-18 ENCOUNTER — Other Ambulatory Visit: Payer: Self-pay | Admitting: Family Medicine

## 2017-04-20 ENCOUNTER — Ambulatory Visit
Admission: RE | Admit: 2017-04-20 | Discharge: 2017-04-20 | Disposition: A | Discharge status: Home / Self Care | Payer: PPO | Source: Ambulatory Visit | Attending: Family Medicine | Admitting: Family Medicine

## 2017-04-20 DIAGNOSIS — Z1231 Encounter for screening mammogram for malignant neoplasm of breast: Secondary | ICD-10-CM | POA: Diagnosis not present

## 2017-04-21 ENCOUNTER — Encounter: Payer: PPO | Admitting: Family Medicine

## 2017-04-22 ENCOUNTER — Telehealth: Payer: Self-pay | Admitting: Cardiovascular Disease

## 2017-04-22 NOTE — Telephone Encounter (Signed)
New Message     Patient calling the office for samples of medication:   1.  What medication and dosage are you requesting samples for? 2.5mg  Eliquis  2.  Are you currently out of this medication? Has enough for today and tomorrow

## 2017-04-22 NOTE — Telephone Encounter (Signed)
Pt notified-samples at the front desk 

## 2017-05-06 ENCOUNTER — Telehealth: Payer: Self-pay | Admitting: Cardiovascular Disease

## 2017-05-06 NOTE — Telephone Encounter (Signed)
Pt says her son is on his way to get her samples.

## 2017-05-06 NOTE — Telephone Encounter (Signed)
Patient's son has been made aware that we are currently out of samples.

## 2017-05-06 NOTE — Telephone Encounter (Signed)
New message  Pt son verbalized that he is calling for the rn   Patient calling the office for samples of medication:   1.  What medication and dosage are you requesting samples for? eliquis 0.25mg  2x day   2.  Are you currently out of this medication? She needs two weeks worth

## 2017-05-07 ENCOUNTER — Telehealth: Payer: Self-pay | Admitting: *Deleted

## 2017-05-07 NOTE — Telephone Encounter (Signed)
The patient's son came in for samples of eliquis 2.5 mg. We are currently out of stock. Per pharmd, the patient may spilt the 5 mg tablets. Samples were given with specific instruction to spilt the 5 mg tablet. He verbalized his understanding.   Medication Samples have been provided to the patient.  Drug name: Eliquis        Strength: 5mg          Qty: 2 boxes   LOT: [id number]   Exp.Date: 12/20

## 2017-05-11 ENCOUNTER — Ambulatory Visit (INDEPENDENT_AMBULATORY_CARE_PROVIDER_SITE_OTHER): Payer: PPO | Admitting: *Deleted

## 2017-05-11 ENCOUNTER — Encounter: Payer: Self-pay | Admitting: *Deleted

## 2017-05-11 VITALS — BP 156/84 | HR 54 | Temp 98.4°F | Ht 61.0 in | Wt 136.6 lb

## 2017-05-11 DIAGNOSIS — D500 Iron deficiency anemia secondary to blood loss (chronic): Secondary | ICD-10-CM

## 2017-05-11 DIAGNOSIS — Z1211 Encounter for screening for malignant neoplasm of colon: Secondary | ICD-10-CM

## 2017-05-11 DIAGNOSIS — Z0000 Encounter for general adult medical examination without abnormal findings: Secondary | ICD-10-CM

## 2017-05-11 NOTE — Patient Instructions (Addendum)
Jacqueline [name],  Thank you for taking time to come for yourMedicare Wellness Visit. I appreciate your ongoing commitment to your health goals. Please review the following plan we discussed and let me know if I can assist you in the future.   These are the goals we discussed:  Goals    . Blood Pressure < 150/90    . Maintain current level of physical activity (pt-stated)          Walking 20 minutes daily       Fall Prevention in the Home Falls can cause injuries. They can happen to people of all ages. There are many things you can do to make your home safe and to help prevent falls. What can I do on the outside of my home?  Regularly fix the edges of walkways and driveways and fix any cracks.  Remove anything that might make you trip as you walk through a door, such as a raised step or threshold.  Trim any bushes or trees on the path to your home.  Use bright outdoor lighting.  Clear any walking paths of anything that might make someone trip, such as rocks or tools.  Regularly check to see if handrails are loose or broken. Make sure that both sides of any steps have handrails.  Any raised decks and porches should have guardrails on the edges.  Have any leaves, snow, or ice cleared regularly.  Use sand or salt on walking paths during winter.  Clean up any spills in your garage right away. This includes oil or grease spills. What can I do in the bathroom?  Use night lights.  Install grab bars by the toilet and in the tub and shower. Do not use towel bars as grab bars.  Use non-skid mats or decals in the tub or shower.  If you need to sit down in the shower, use a plastic, non-slip stool.  Keep the floor dry. Clean up any water that spills on the floor as soon as it happens.  Remove soap buildup in the tub or shower regularly.  Attach bath mats securely with double-sided non-slip rug tape.  Do not have throw rugs and other things on the floor that can make you  trip. What can I do in the bedroom?  Use night lights.  Make sure that you have a light by your bed that is easy to reach.  Do not use any sheets or blankets that are too big for your bed. They should not hang down onto the floor.  Have a firm chair that has side arms. You can use this for support while you get dressed.  Do not have throw rugs and other things on the floor that can make you trip. What can I do in the kitchen?  Clean up any spills right away.  Avoid walking on wet floors.  Keep items that you use a lot in easy-to-reach places.  If you need to reach something above you, use a strong step stool that has a grab bar.  Keep electrical cords out of the way.  Do not use floor polish or wax that makes floors slippery. If you must use wax, use non-skid floor wax.  Do not have throw rugs and other things on the floor that can make you trip. What can I do with my stairs?  Do not leave any items on the stairs.  Make sure that there are handrails on both sides of the stairs and use them. Fix  handrails that are broken or loose. Make sure that handrails are as long as the stairways.  Check any carpeting to make sure that it is firmly attached to the stairs. Fix any carpet that is loose or worn.  Avoid having throw rugs at the top or bottom of the stairs. If you do have throw rugs, attach them to the floor with carpet tape.  Make sure that you have a light switch at the top of the stairs and the bottom of the stairs. If you do not have them, ask someone to add them for you. What else can I do to help prevent falls?  Wear shoes that: ? Do not have high heels. ? Have rubber bottoms. ? Are comfortable and fit you well. ? Are closed at the toe. Do not wear sandals.  If you use a stepladder: ? Make sure that it is fully opened. Do not climb a closed stepladder. ? Make sure that both sides of the stepladder are locked into place. ? Ask someone to hold it for you, if  possible.  Clearly mark and make sure that you can see: ? Any grab bars or handrails. ? First and last steps. ? Where the edge of each step is.  Use tools that help you move around (mobility aids) if they are needed. These include: ? Canes. ? Walkers. ? Scooters. ? Crutches.  Turn on the lights when you go into a dark area. Replace any light bulbs as soon as they burn out.  Set up your furniture so you have a clear path. Avoid moving your furniture around.  If any of your floors are uneven, fix them.  If there are any pets around you, be aware of where they are.  Review your medicines with your doctor. Some medicines can make you feel dizzy. This can increase your chance of falling. Ask your doctor what other things that you can do to help prevent falls. This information is not intended to replace advice given to you by your health care provider. Make sure you discuss any questions you have with your health care provider. Document Released: 05/09/2009 Document Revised: 12/19/2015 Document Reviewed: 08/17/2014 Elsevier Interactive Patient Education  2018 [place] Maintenance, Female Adopting a healthy lifestyle and getting preventive care can go a long way to promote health and wellness. Talk with your health care provider about what schedule of regular examinations is right for you. This is a good chance for you to check in with your provider about disease prevention and staying healthy. In between checkups, there are plenty of things you can do on your own. Experts have done a lot of research about which lifestyle changes and preventive measures are most likely to keep you healthy. Ask your health care provider for more information. Weight and diet Eat a healthy diet  Be sure to include plenty of vegetables, fruits, low-fat dairy products, and lean protein.  Do not eat a lot of foods high in solid fats, added sugars, or salt.  Get regular exercise. This is one of  the most important things you can do for your health. ? Most adults should exercise for at least 150 minutes each week. The exercise should increase your heart rate and make you sweat (moderate-intensity exercise). ? Most adults should also do strengthening exercises at least twice a week. This is in addition to the moderate-intensity exercise.  Maintain a healthy weight  Body mass index (BMI) is a measurement that can be used  to identify possible weight problems. It estimates body fat based on height and weight. Your health care provider can help determine your BMI and help you achieve or maintain a healthy weight.  For females 68 years of age and older: ? A BMI below 18.5 is considered underweight. ? A BMI of 18.5 to 24.9 is normal. ? A BMI of 25 to 29.9 is considered overweight. ? A BMI of 30 and above is considered obese.  Watch levels of cholesterol and blood lipids  You should start having your blood tested for lipids and cholesterol at 81 years of age, then have this test every 5 years.  You may need to have your cholesterol levels checked more often if: ? Your lipid or cholesterol levels are high. ? You are older than 81 years of age. ? You are at high risk for heart disease.  Cancer screening Lung Cancer  Lung cancer screening is recommended for adults 71-86 years old who are at high risk for lung cancer because of a history of smoking.  A yearly low-dose CT scan of the lungs is recommended for people who: ? Currently smoke. ? Have quit within the past 15 years. ? Have at least a 30-pack-year history of smoking. A pack year is smoking an average of one pack of cigarettes a day for 1 year.  Yearly screening should continue until it has been 15 years since you quit.  Yearly screening should stop if you develop a health problem that would prevent you from having lung cancer treatment.  Breast Cancer  Practice breast self-awareness. This means understanding how your  breasts normally appear and feel.  It also means doing regular breast self-exams. Let your health care provider know about any changes, no matter how small.  If you are in your 20s or 30s, you should have a clinical breast exam (CBE) by a health care provider every 1-3 years as part of a regular health exam.  If you are 50 or older, have a CBE every year. Also consider having a breast X-ray (mammogram) every year.  If you have a family history of breast cancer, talk to your health care provider about genetic screening.  If you are at high risk for breast cancer, talk to your health care provider about having an MRI and a mammogram every year.  Breast cancer gene (BRCA) assessment is recommended for women who have family members with BRCA-related cancers. BRCA-related cancers include: ? Breast. ? Ovarian. ? Tubal. ? Peritoneal cancers.  Results of the assessment will determine the need for genetic counseling and BRCA1 and BRCA2 testing.  Cervical Cancer Your health care provider may recommend that you be screened regularly for cancer of the pelvic organs (ovaries, uterus, and vagina). This screening involves a pelvic examination, including checking for microscopic changes to the surface of your cervix (Pap test). You may be encouraged to have this screening done every 3 years, beginning at age 51.  For women ages 16-65, health care providers may recommend pelvic exams and Pap testing every 3 years, or they may recommend the Pap and pelvic exam, combined with testing for human papilloma virus (HPV), every 5 years. Some types of HPV increase your risk of cervical cancer. Testing for HPV may also be done on women of any age with unclear Pap test results.  Other health care providers may not recommend any screening for nonpregnant women who are considered low risk for pelvic cancer and who do not have symptoms. Ask your health  care provider if a screening pelvic exam is right for you.  If you  have had past treatment for cervical cancer or a condition that could lead to cancer, you need Pap tests and screening for cancer for at least 20 years after your treatment. If Pap tests have been discontinued, your risk factors (such as having a new sexual partner) need to be reassessed to determine if screening should resume. Some women have medical problems that increase the chance of getting cervical cancer. In these cases, your health care provider may recommend more frequent screening and Pap tests.  Colorectal Cancer  This type of cancer can be detected and often prevented.  Routine colorectal cancer screening usually begins at 81 years of age and continues through 81 years of age.  Your health care provider may recommend screening at an earlier age if you have risk factors for colon cancer.  Your health care provider may also recommend using home test kits to check for hidden blood in the stool.  A small camera at the end of a tube can be used to examine your colon directly (sigmoidoscopy or colonoscopy). This is done to check for the earliest forms of colorectal cancer.  Routine screening usually begins at age 58.  Direct examination of the colon should be repeated every 5-10 years through 81 years of age. However, you may need to be screened more often if early forms of precancerous polyps or small growths are found.  Skin Cancer  Check your skin from head to toe regularly.  Tell your health care provider about any new moles or changes in moles, especially if there is a change in a mole's shape or color.  Also tell your health care provider if you have a mole that is larger than the size of a pencil eraser.  Always use sunscreen. Apply sunscreen liberally and repeatedly throughout the day.  Protect yourself by wearing long sleeves, pants, a wide-brimmed hat, and sunglasses whenever you are outside.  Heart disease, diabetes, and high blood pressure  High blood pressure causes  heart disease and increases the risk of stroke. High blood pressure is more likely to develop in: ? People who have blood pressure in the high end of the normal range (130-139/85-89 mm Hg). ? People who are overweight or obese. ? People who are African American.  If you are 14-62 years of age, have your blood pressure checked every 3-5 years. If you are 70 years of age or older, have your blood pressure checked every year. You should have your blood pressure measured twice-once when you are at a hospital or clinic, and once when you are not at a hospital or clinic. Record the average of the two measurements. To check your blood pressure when you are not at a hospital or clinic, you can use: ? An automated blood pressure machine at a pharmacy. ? A home blood pressure monitor.  If you are between 72 years and 38 years old, ask your health care provider if you should take aspirin to prevent strokes.  Have regular diabetes screenings. This involves taking a blood sample to check your fasting blood sugar level. ? If you are at a normal weight and have a low risk for diabetes, have this test once every three years after 81 years of age. ? If you are overweight and have a high risk for diabetes, consider being tested at a younger age or more often. Preventing infection Hepatitis B  If you have a higher  risk for hepatitis B, you should be screened for this virus. You are considered at high risk for hepatitis B if: ? You were born in a country where hepatitis B is common. Ask your health care provider which countries are considered high risk. ? Your parents were born in a high-risk country, and you have not been immunized against hepatitis B (hepatitis B vaccine). ? You have HIV or AIDS. ? You use needles to inject street drugs. ? You live with someone who has hepatitis B. ? You have had sex with someone who has hepatitis B. ? You get hemodialysis treatment. ? You take certain medicines for  conditions, including cancer, organ transplantation, and autoimmune conditions.  Hepatitis C  Blood testing is recommended for: ? Everyone born from [birth year] through 1963. ? Anyone with known risk factors for hepatitis C.  Sexually transmitted infections (STIs)  You should be screened for sexually transmitted infections (STIs) including gonorrhea and chlamydia if: ? You are sexually active and are younger than 81 years of age. ? You are older than 81 years of age and your health care provider tells you that you are at risk for this type of infection. ? Your sexual activity has changed since you were last screened and you are at an increased risk for chlamydia or gonorrhea. Ask your health care provider if you are at risk.  If you do not have HIV, but are at risk, it may be recommended that you take a prescription medicine daily to prevent HIV infection. This is called pre-exposure prophylaxis (PrEP). You are considered at risk if: ? You are sexually active and do not regularly use condoms or know the HIV status of your partner(s). ? You take drugs by injection. ? You are sexually active with a partner who has HIV.  Talk with your health care provider about whether you are at high risk of being infected with HIV. If you choose to begin PrEP, you should first be tested for HIV. You should then be tested every 3 months for as long as you are taking PrEP. Pregnancy  If you are premenopausal and you may become pregnant, ask your health care provider about preconception counseling.  If you may become pregnant, take 400 to 800 micrograms (mcg) of folic acid every day.  If you want to prevent pregnancy, talk to your health care provider about birth control (contraception). Osteoporosis and menopause  Osteoporosis is a disease in which the bones lose minerals and strength with aging. This can result in serious bone fractures. Your risk for osteoporosis can be identified using a bone density  scan.  If you are 64 years of age or older, or if you are at risk for osteoporosis and fractures, ask your health care provider if you should be screened.  Ask your health care provider whether you should take a calcium or vitamin D supplement to lower your risk for osteoporosis.  Menopause may have certain physical symptoms and risks.  Hormone replacement therapy may reduce some of these symptoms and risks. Talk to your health care provider about whether hormone replacement therapy is right for you. Follow these instructions at home:  Schedule regular health, dental, and eye exams.  Stay current with your immunizations.  Do not use any tobacco products including cigarettes, chewing tobacco, or electronic cigarettes.  If you are pregnant, do not drink alcohol.  If you are breastfeeding, limit how much and how often you drink alcohol.  Limit alcohol intake to no more than  1 drink per day for nonpregnant women. One drink equals 12 ounces of beer, 5 ounces of wine, or 1 ounces of hard liquor.  Do not use street drugs.  Do not share needles.  Ask your health care provider for help if you need support or information about quitting drugs.  Tell your health care provider if you often feel depressed.  Tell your health care provider if you have ever been abused or do not feel safe at home. This information is not intended to replace advice given to you by your health care provider. Make sure you discuss any questions you have with your health care provider. Document Released: 01/26/2011 Document Revised: 12/19/2015 Document Reviewed: 04/16/2015 Elsevier Interactive Patient Education  [employer].

## 2017-05-11 NOTE — Progress Notes (Signed)
Subjective:   Jacqueline [name] is a 83 y.o. female who presents for an Initial Medicare Annual Wellness Visit.   Cardiac Risk Factors include: advanced age (>68men, >64 women);dyslipidemia;hypertension     Objective:    Today's Vitals   05/11/17 1507 05/11/17 1522 05/11/17 1555  BP: (!) 154/74 (!) 156/84   Pulse: (!) 54    Temp: 98.4 F (36.9 C)    TempSrc: Oral    SpO2: 99%    Weight: 136 lb 9.6 oz (62 kg)    Height: 5\' 1"  (1.549 m)    PainSc: 5   5   PainLoc: Back     Body mass index is 25.81 kg/m.  BP recheck 156/84. Patient brought BP log with her. Systolic ranging 979-892 and Diastolic ranging 11-94 Heart rate ranging 58-72  Current Medications (verified) Outpatient Encounter Prescriptions as of 05/11/2017  Medication Sig  . acetaminophen (TYLENOL) 500 MG tablet Take 500-1,000 mg by mouth every 6 (six) hours as needed for moderate pain or headache.  . ALPRAZolam (XANAX) 0.5 MG tablet TAKE 1 TABLET BY MOUTH TWICE A DAY AS NEEDED FOR ANXIETY  . diphenhydrAMINE (BENADRYL) 25 MG tablet Take 25 mg by mouth at bedtime.   . diphenhydramine-acetaminophen (TYLENOL PM) 25-500 MG TABS tablet Take 1 tablet by mouth at bedtime.  Marland Kitchen ELIQUIS 2.5 MG TABS tablet TAKE 1 TABLET BY MOUTH TWICE A DAY  . furosemide (LASIX) 20 MG tablet TAKE 1 TABLET BY MOUTH EVERY DAY  . gabapentin (NEURONTIN) 100 MG capsule TAKE 1 CAPSULE (100 MG TOTAL) BY MOUTH 3 (THREE) TIMES DAILY.  Marland Kitchen Iron-Vitamins (GERITOL COMPLETE PO) Take 1 tablet by mouth daily.  . isosorbide mononitrate (IMDUR) 30 MG 24 hr tablet TAKE 1 TABLET BY MOUTH EVERY DAY NEEDS APPT FOR REFILLS  . metoprolol succinate (TOPROL-XL) 25 MG 24 hr tablet Take 1 tablet (25 mg total) by mouth daily.  Marland Kitchen NITROSTAT 0.4 MG SL tablet PLACE 1 TABLET (0.4 MG TOTAL) UNDER THE TONGUE EVERY 5 (FIVE) MINUTES AS NEEDED FOR CHEST PAIN.  Marland Kitchen Omega-3 Fatty Acids (FISH OIL) 1000 MG CAPS Take 1-2 capsules by mouth 2 (two) times daily. Takes one in the morning and two  capsules at night.  Marland Kitchen omeprazole (PRILOSEC) 20 MG capsule TAKE ONE CAPSULE BY MOUTH EVERY DAY  . polyethylene glycol powder (GLYCOLAX/MIRALAX) powder MIX 17 G AND TAKE BY MOUTH DAILY. (Patient taking differently: MIX 17 G AND TAKE BY MOUTH THREE TIMES WEEKLY)  . rOPINIRole (REQUIP) 0.25 MG tablet TAKE 1 TABLET BY MOUTH 3 TIMES A DAY (Patient taking differently: TAKE 0.25MG  BY MOUTH ONCE DAILY)  . simvastatin (ZOCOR) 40 MG tablet TAKE 1/2 A TABLET BY MOUTH AT BEDTIME. (Patient taking differently: TAKE 20MG  (1/2 TAB)  BY MOUTH AT BEDTIME.)  . traMADol (ULTRAM) 50 MG tablet TAKE 1 TABLET BY MOUTH TWICE A DAY AS NEEDED FOR PAIN  . traZODone (DESYREL) 100 MG tablet TAKE 1 TABLET (100 MG TOTAL) BY MOUTH AT BEDTIME.  . hydrALAZINE (APRESOLINE) 25 MG tablet TAKE 1/2 TABLET (12.5 MG TOTAL) BY MOUTH 3 TIMES DAILY. PLEASE MAKE APPT FOR REFILLS. (Patient not taking: Reported on 05/11/2017)   No facility-administered encounter medications on file as of 05/11/2017.     Allergies (verified) Atorvastatin; Crestor [rosuvastatin]; Morphine and related; and Prednisone   History: Past Medical History:  Diagnosis Date  . Arthritis    back  . Atrial fibrillation with rapid ventricular response ([place]) 02/2014   a. CHA2DS2VASc = 6 -> on  eliquis;  b. 02/2014 s/p DCCV;  c. 08/2014 Echo: EF 55-60%, Gr 2 DD, mild MR, triv AI.  Marland Kitchen CAD (coronary artery disease)    a. s/p CABG x 2 (LIMA->LAD, VG->Diag);  b. 08/2014 Cath: LM nl, LAD 90p, LCX nl, RCA nl/dominant, LIMA->LAD atretic, VG->D2 patent w/ retrograde filling of LAD, EF 55-60%-->Med Rx.  . Diverticulitis 02/21/2012  . GERD (gastroesophageal reflux disease)   . Hyperlipemia   . Hypertension   . Insomnia    Past Surgical History:  Procedure Laterality Date  . ABDOMINAL HYSTERECTOMY  1975  . CARDIAC CATHETERIZATION N/A 05/09/2015   Procedure: Right Heart Cath;  Surgeon: Larey Dresser, MD;  Location: [place] [facility];  Service: Cardiovascular;  Laterality: N/A;    . CARDIOVERSION N/A 02/28/2014   Procedure: CARDIOVERSION;  Surgeon: Sanda Klein, MD;  Location: [place] [facility];  Service: Cardiovascular;  Laterality: N/A;  . CATARACT EXTRACTION Bilateral 2014   with lens implanted  . COLONOSCOPY N/A 02/19/2015   Procedure: COLONOSCOPY;  Surgeon: Ladene Artist, MD;  Location: [facility] ENDOSCOPY;  Service: Endoscopy;  Laterality: N/A;  . CORONARY ARTERY BYPASS GRAFT  2006   off-pump bypass surgery with LIMA to the LAD and SVG to the second diagonal artery ( performed by Dr Servando Snare)   . JOINT REPLACEMENT Bilateral    L-2004, R-2006  . LEFT HEART CATHETERIZATION WITH CORONARY/GRAFT ANGIOGRAM N/A 09/17/2014   Procedure: LEFT HEART CATHETERIZATION WITH Beatrix Fetters;  Surgeon: Troy Sine, MD;  Location: [facility] CATH LAB;  Service: Cardiovascular;  Laterality: N/A;  . REIMPLANTATION OF TOTAL KNEE Right 10/08/2014   Procedure: REIMPLANTATION OF RIGHT TOTAL KNEE ARTHROPLASTY WITH REMOVAL OF ANTIBIOTIC SPACER;  Surgeon: Paralee Cancel, MD;  Location: [facility];  Service: Orthopedics;  Laterality: Right;  . ROTATOR CUFF REPAIR Bilateral    r-1999, l- 2005  . TEE WITHOUT CARDIOVERSION N/A 02/28/2014   Procedure: TRANSESOPHAGEAL ECHOCARDIOGRAM (TEE);  Surgeon: Sanda Klein, MD;  Location: [place];  Service: Cardiovascular;  Laterality: N/A;  . TOTAL KNEE ARTHROPLASTY Right 07/02/2014   Procedure: Resection of Infected Right Total Knee Arthroplasty with placement antibiotic spacer;  Surgeon: Mauri Pole, MD;  Location: [facility] OR[facility];  Service: Orthopedics;  Laterality: Right;   Family History  Problem Relation Age of Onset  . Parkinson's disease Mother   . Heart attack Brother   . Arthritis Brother   . Cancer Brother        Unknown  . Breast cancer Neg Hx    Social History   Occupational History  . Not on file.   Social History Main Topics  . Smoking status: Never Smoker  . Smokeless tobacco: Never Used  . Alcohol use No  . Drug use: No  . Sexual  activity: No    Tobacco Counseling Patient has never smoked and has no plans to start.  Activities of Daily Living In your present state of health, do you have any difficulty performing the following activities: 05/11/2017 03/08/2017  Hearing? N N  Vision? N N  Difficulty concentrating or making decisions? N N  Walking or climbing stairs? N N  Dressing or bathing? N N  Doing errands, shopping? N Y  Conservation officer, nature and eating ? N -  Using the Toilet? N -  In the past six months, have you accidently leaked urine? Y -  Do you have problems with loss of bowel control? N -  Managing your Medications? N -  Managing your Finances? N -  Housekeeping or managing  your Housekeeping? N -  Some recent data might be hidden   Home Safety:  My home has a working smoke alarm:  Yes X 1           My home throw rugs have been fastened down to the floor or removed:  Removed I have a non-slip surface or non-slip mats in the bathtub and shower:  Mat        All my home's stairs have handrails, including any outdoor stairs  One level home with 3 outside steps with handrails          My home's floors, stairs and hallways are free from clutter, wires and cords:  Yes     I have animals in my home  No I wear seatbelts consistently:  Yes   Immunizations and Health Maintenance Immunization History  Administered Date(s) Administered  . Influenza Split 05/01/2011, 04/19/2012  . Influenza Whole 05/05/2007, 04/20/2008, 05/16/2008, 05/02/2009, 04/23/2010  . Influenza,inj,Quad PF,6+ Mos 05/04/2013, 05/09/2014, 03/27/2015, 04/08/2016, 03/24/2017  . Pneumococcal Conjugate-13 12/22/2013  . Pneumococcal Polysaccharide-23 10/25/1997, 03/04/2012  . Td 10/25/1997, 12/30/2007   There are no preventive care reminders to display for this patient.  Patient Care Team: [name], MD as PCP - General Croitoru, Dani Gobble, MD as Consulting Physician (Cardiology) Darleen Crocker, MD as Consulting Physician  (Ophthalmology)  Indicate any recent Medical Services you may have received from other than [facility] providers in the past year (date may be approximate).     Assessment:   This is a routine wellness examination for [place].   Hearing/Vision screen  Hearing Screening   Method: Audiometry   125Hz  250Hz  500Hz  1000Hz  2000Hz  3000Hz  4000Hz  6000Hz  8000Hz   Right ear:   40 40 40  40    Left ear:   Fail 40 40  40      Dietary issues and exercise activities discussed: Current Exercise Habits: Home exercise routine, Type of exercise: walking, Time (Minutes): 20, Frequency (Times/Week): 7, Weekly Exercise (Minutes/Week): 140, Intensity: Moderate, Exercise limited by: None identified  Goals    . Blood Pressure < 150/90    . Maintain current level of physical activity (pt-stated)          Walking 20 minutes daily      Depression Screen PHQ 2/9 Scores 05/11/2017 03/24/2017 03/24/2017 04/08/2016 12/12/2015 09/25/2015 09/11/2015  PHQ - 2 Score 0 0 0 0 0 0 0    Fall Risk Fall Risk  05/11/2017 04/08/2016 12/12/2015 09/25/2015 09/11/2015  Falls in the past year? No No No Yes -  Comment - - - In last year, pt. reports was getting ice out of ice maker and turned and  lost footing; this was her only fall over last year; pt. reports it happened "months ago," stating she doesn't exactly emember when -  Number falls in past yr: - - - 1 -  Comment - - - - -  Injury with Fall? - - - No -  Risk for fall due to : - - - Medication side effect Impaired balance/gait;Impaired mobility;Impaired vision;Medication side effect  Risk for fall due to: Comment - - - - -  Follow up - - - Education provided;Falls prevention discussed -   TUG Test:  Done in 15 seconds. Patient used one hand to push out of chair and to sit back down. Patient wearing kitten heels. Discussed how heels may increase fall risk. Patient states she usually wears flats or Skechers. Falls prevention discussed in detail and literature given.  Cognitive  Function: Mini-Cog  Passed with score 3/5   Screening Tests Health Maintenance  Topic Date Due  . TETANUS/TDAP  12/29/2017  . INFLUENZA VACCINE  Completed  . DEXA SCAN  Completed  . PNA vac Low Risk Adult  Completed      Plan:   Patient had positive stool test in 2016 with follow up colonoscopy for lower GI bleed. Patient given FIT with instructions from lab staff.   I have personally reviewed and noted the following in the patient's chart:   . Medical and social history . Use of alcohol, tobacco or illicit drugs  . Current medications and supplements . Functional ability and status . Nutritional status . Physical activity . Advanced directives . List of other physicians . Hospitalizations, surgeries, and ER visits in previous 12 months . Vitals . Screenings to include cognitive, depression, and falls . Referrals and appointments  In addition, I have reviewed and discussed with patient certain preventive protocols, quality metrics, and best practice recommendations. A written personalized care plan for preventive services as well as general preventive health recommendations were provided to patient.     Velora Heckler, RN   05/11/2017

## 2017-05-12 ENCOUNTER — Encounter: Payer: Self-pay | Admitting: *Deleted

## 2017-05-12 NOTE — Progress Notes (Signed)
Patient ID: Jacqueline [name], female   DOB: 1931/06/06, 83 y.o.   MRN: [id number] I have reviewed this visit and discussed with Howell Rucks, RN, BSN, and agree with her documentation.

## 2017-05-13 ENCOUNTER — Telehealth: Payer: Self-pay | Admitting: Family Medicine

## 2017-05-13 NOTE — Telephone Encounter (Signed)
Fax # is for patient's son. Will route to PCP for permission to fax AWV notes to him L. Silvano Rusk, RN, BSN

## 2017-05-13 NOTE — Telephone Encounter (Signed)
Pt is calling and would like to make sure that we don't forget to fax over her AWV results to her insurance company. [phone]. jw

## 2017-05-13 NOTE — Telephone Encounter (Signed)
AWV notes faxed to patient's son at [phone]. Hubbard Hartshorn, RN, BSN

## 2017-05-13 NOTE — Telephone Encounter (Signed)
OK to fax note

## 2017-05-17 DIAGNOSIS — Z1211 Encounter for screening for malignant neoplasm of colon: Secondary | ICD-10-CM | POA: Diagnosis not present

## 2017-05-17 NOTE — Addendum Note (Signed)
Addended by: Francene Castle on: 05/17/2017 11:35 AM   Modules accepted: Orders

## 2017-05-21 ENCOUNTER — Other Ambulatory Visit: Payer: Self-pay | Admitting: Family Medicine

## 2017-05-23 ENCOUNTER — Encounter: Payer: Self-pay | Admitting: Family Medicine

## 2017-05-23 LAB — FECAL OCCULT BLOOD, IMMUNOCHEMICAL: FECAL OCCULT BLD: POSITIVE — AB

## 2017-05-23 NOTE — Patient Instructions (Signed)
I got result for + FIT/FPOBT test in my in-box. Test was ordered by the RN. I forwarded result to PCP to follow-up on it for treatment recommendation.

## 2017-05-24 ENCOUNTER — Telehealth: Payer: Self-pay | Admitting: *Deleted

## 2017-05-24 NOTE — Progress Notes (Signed)
Received and I am reviewing to formulate a plan of FU.

## 2017-05-24 NOTE — Telephone Encounter (Signed)
Appointment made Nov 21, it was the first available.

## 2017-05-24 NOTE — Telephone Encounter (Signed)
-----   Message from Kinnie Feil, MD sent at 05/23/2017  7:15 PM EDT ----- Please contact patient to schedule follow-up with her PCP to discuss FIT/FOBT result. Earliest available appointment with Dr. Andria Frames. Thanks.

## 2017-05-24 NOTE — Telephone Encounter (Signed)
LM for patient to call back and schedule an appointment with PCP. Jazmin Hartsell,CMA

## 2017-05-25 NOTE — Assessment & Plan Note (Signed)
Still with pos hemocult.  Not a surprise given known diverticulosis, eliquis therapy and previous heme +.  Given normal recent Hgb, no additional testing.  She has an appointment to see me in three weeks.  Patient informed of result and agrees with plan.

## 2017-05-28 ENCOUNTER — Other Ambulatory Visit: Payer: Self-pay | Admitting: Family Medicine

## 2017-06-02 ENCOUNTER — Telehealth: Payer: Self-pay | Admitting: Cardiovascular Disease

## 2017-06-02 NOTE — Telephone Encounter (Signed)
Patient calling the office for samples of medication:   1.  What medication and dosage are you requesting samples for? eliquis 5mg   2.  Are you currently out of this medication?  A day left

## 2017-06-03 MED ORDER — APIXABAN 2.5 MG PO TABS: 2.5000 mg | ORAL_TABLET | Freq: Two times a day (BID) | ORAL | 0 refills | Status: DC

## 2017-06-03 NOTE — Telephone Encounter (Signed)
Patient aware samples are at the front desk for pick up  

## 2017-06-08 ENCOUNTER — Other Ambulatory Visit: Payer: Self-pay | Admitting: Physician Assistant

## 2017-06-08 NOTE — Telephone Encounter (Signed)
REFILL 

## 2017-06-16 ENCOUNTER — Encounter: Payer: Self-pay | Admitting: Family Medicine

## 2017-06-16 ENCOUNTER — Ambulatory Visit: Payer: PPO | Admitting: Family Medicine

## 2017-06-16 ENCOUNTER — Other Ambulatory Visit: Payer: Self-pay

## 2017-06-16 DIAGNOSIS — I480 Paroxysmal atrial fibrillation: Secondary | ICD-10-CM | POA: Diagnosis not present

## 2017-06-16 DIAGNOSIS — D500 Iron deficiency anemia secondary to blood loss (chronic): Secondary | ICD-10-CM

## 2017-06-16 DIAGNOSIS — N183 Chronic kidney disease, stage 3 unspecified: Secondary | ICD-10-CM

## 2017-06-16 DIAGNOSIS — I10 Essential (primary) hypertension: Secondary | ICD-10-CM | POA: Diagnosis not present

## 2017-06-16 NOTE — Assessment & Plan Note (Signed)
BMP to check for progression.

## 2017-06-16 NOTE — Assessment & Plan Note (Signed)
Already at low dose of eliquis for a fib. Check CBC and ferritin to make sure not becoming anemic.

## 2017-06-16 NOTE — Patient Instructions (Signed)
I will only worry about the lack of appetite if your weight drops or if you start feeling bad.   We only need to make sure you don't develop anemia from your small amount of bleeding. Plan to see me every three months from here.

## 2017-06-16 NOTE — Assessment & Plan Note (Signed)
Good control on current meds. 

## 2017-06-16 NOTE — Assessment & Plan Note (Signed)
Rate controled and asymptomatic.  Follow CBC

## 2017-06-16 NOTE — Progress Notes (Signed)
   Subjective:    Patient ID: Jacqueline [name], female    DOB: 10/21/1930, 83 y.o.   MRN: [id number]  HPI FU annual wellness visit.  C/O some rectal bleeding then (none now.) Hemocult +.  Reviewed last colonoscopy 2016.  Normal (diverticulosis and int hem.  No polyps.)   Only current symptom is lack of appetite.  No weight loss.  No other symptoms.  In fact, feels great.      Review of Systems     Objective:   Physical Exam VS and wt noted Lungs clear Cardiac RRR without m or g          Assessment & Plan:

## 2017-06-17 LAB — BASIC METABOLIC PANEL
BUN / CREAT RATIO: 13 (ref 12–28)
BUN: 19 mg/dL (ref 8–27)
CO2: 25 mmol/L (ref 20–29)
CREATININE: 1.43 mg/dL — AB (ref 0.57–1.00)
Calcium: 9.2 mg/dL (ref 8.7–10.3)
Chloride: 103 mmol/L (ref 96–106)
GFR calc non Af Amer: 34 mL/min/{1.73_m2} — ABNORMAL LOW (ref >59)
GFR, EST AFRICAN AMERICAN: 39 mL/min/{1.73_m2} — AB (ref >59)
Glucose: 86 mg/dL (ref 65–99)
Potassium: 4 mmol/L (ref 3.5–5.2)
SODIUM: 143 mmol/L (ref 134–144)

## 2017-06-17 LAB — CBC
HEMATOCRIT: 33.8 % — AB (ref 34.0–46.6)
HEMOGLOBIN: 11.1 g/dL (ref 11.1–15.9)
MCH: 31.3 pg (ref 26.6–33.0)
MCHC: 32.8 g/dL (ref 31.5–35.7)
MCV: 95 fL (ref 79–97)
Platelets: 164 10*3/uL (ref 150–379)
RBC: 3.55 x10E6/uL — ABNORMAL LOW (ref 3.77–5.28)
RDW: 13.9 % (ref 12.3–15.4)
WBC: 3.7 10*3/uL (ref 3.4–10.8)

## 2017-06-17 LAB — FERRITIN: Ferritin: 199 ng/mL — ABNORMAL HIGH (ref 15–150)

## 2017-07-02 ENCOUNTER — Telehealth: Payer: Self-pay

## 2017-07-02 ENCOUNTER — Other Ambulatory Visit: Payer: Self-pay

## 2017-07-02 MED ORDER — NITROGLYCERIN 0.4 MG SL SUBL: SUBLINGUAL_TABLET | SUBLINGUAL | 1 refills | Status: DC

## 2017-07-02 MED ORDER — ISOSORBIDE MONONITRATE ER 30 MG PO TB24: ORAL_TABLET | ORAL | 3 refills | Status: DC

## 2017-07-02 MED ORDER — ISOSORBIDE MONONITRATE ER 30 MG PO TB24: ORAL_TABLET | ORAL | 6 refills | Status: DC

## 2017-07-02 NOTE — Telephone Encounter (Signed)
error 

## 2017-07-08 ENCOUNTER — Encounter: Payer: Self-pay | Admitting: Cardiovascular Disease

## 2017-07-08 ENCOUNTER — Ambulatory Visit (INDEPENDENT_AMBULATORY_CARE_PROVIDER_SITE_OTHER): Payer: PPO | Admitting: Cardiovascular Disease

## 2017-07-08 VITALS — BP 122/60 | Ht 61.0 in | Wt 132.6 lb

## 2017-07-08 DIAGNOSIS — Z7901 Long term (current) use of anticoagulants: Secondary | ICD-10-CM | POA: Insufficient documentation

## 2017-07-08 DIAGNOSIS — I2721 Secondary pulmonary arterial hypertension: Secondary | ICD-10-CM | POA: Diagnosis not present

## 2017-07-08 DIAGNOSIS — I5032 Chronic diastolic (congestive) heart failure: Secondary | ICD-10-CM

## 2017-07-08 DIAGNOSIS — I25118 Atherosclerotic heart disease of native coronary artery with other forms of angina pectoris: Secondary | ICD-10-CM | POA: Diagnosis not present

## 2017-07-08 DIAGNOSIS — N183 Chronic kidney disease, stage 3 unspecified: Secondary | ICD-10-CM

## 2017-07-08 DIAGNOSIS — I480 Paroxysmal atrial fibrillation: Secondary | ICD-10-CM

## 2017-07-08 DIAGNOSIS — E7800 Pure hypercholesterolemia, unspecified: Secondary | ICD-10-CM | POA: Diagnosis not present

## 2017-07-08 NOTE — Patient Instructions (Signed)
Dr Croitoru recommends that you schedule a follow-up appointment in 12 months. You will receive a reminder letter in the mail two months in advance. If you don't receive a letter, please call our office to schedule the follow-up appointment.  If you need a refill on your cardiac medications before your next appointment, please call your pharmacy. 

## 2017-07-08 NOTE — Progress Notes (Signed)
Patient ID: Jacqueline [name], female   DOB: [birth year], 83 y.o.   MRN: [id number]    Cardiology Office Note    Date:  07/08/2017   ID:  Jacqueline [name], DOB 07-26-1931, MRN [id number]  PCP:  [name], MD  Cardiologist:   Sanda Klein, MD   Chief Complaint  Patient presents with  . Follow-up    pt reports recurrent nosebleeds - had one last pm, stopped bleeding after 5-10 min. no cp or shob.    History of Present Illness:  Jacqueline [name] is a 83 y.o. female with coronary artery disease and previous bypass surgery, paroxysmal atrial fibrillation requiring previous cardioversion, diastolic heart failure, HTN.  She is doing well from a cardiac point of view.  She has been very conscientious with sodium restriction and monitors her weight regularly.  Her weight is almost always exactly 130 pounds on her home scale has not changed in a month.  She is breathing well and denies dyspnea with usual activity.  She has not had any leg edema.  She has not been aware of any irregularity in her heartbeat (but did not really feel any palpitations when she had atrial flutter either).  She denies dizziness or syncope, focal neurological complaints or claudication.  She has an occasional nosebleed including one last night, but these are generally brief.  She has not had any GI bleeding.  She has not had a problem with unsteady gait or falls.  She was hospitalized in August with heart failure related to atrial flutter with rapid ventricular response.  She has known severe pulmonary artery hypertension. Right heart catheterization October 2016 showed a PA pressure of 77/28 (mean 49 mL Hg), explained at least in part by left heart failure with a mean wedge pressure of 27 mmHg (V wave 41 mmHg).  Coronary angiography (2016)showed atretic LIMA to LAD but patent SVG to diagonal and good distal flow. She has normal left ventricular systolic function. She previously underwent electrical cardioversion for atrial  fibrillation in 2015, anticoagulation was stopped in 2016 following an episode of GI bleeding.  She continues to live independently and fiercely values her autonomy.  One of her granddaughters lives with her in the home.   Past Medical History:  Diagnosis Date  . Arthritis    back  . Atrial fibrillation with rapid ventricular response ([place]) 02/2014   a. CHA2DS2VASc = 6 -> on eliquis;  b. 02/2014 s/p DCCV;  c. 08/2014 Echo: EF 55-60%, Gr 2 DD, mild MR, triv AI.  Marland Kitchen CAD (coronary artery disease)    a. s/p CABG x 2 (LIMA->LAD, VG->Diag);  b. 08/2014 Cath: LM nl, LAD 90p, LCX nl, RCA nl/dominant, LIMA->LAD atretic, VG->D2 patent w/ retrograde filling of LAD, EF 55-60%-->Med Rx.  . Diverticulitis 02/21/2012  . GERD (gastroesophageal reflux disease)   . Hyperlipemia   . Hypertension   . Insomnia     Past Surgical History:  Procedure Laterality Date  . ABDOMINAL HYSTERECTOMY  1975  . CARDIAC CATHETERIZATION N/A 05/09/2015   Procedure: Right Heart Cath;  Surgeon: Larey Dresser, MD;  Location: [facility];  Service: Cardiovascular;  Laterality: N/A;  . CARDIOVERSION N/A 02/28/2014   Procedure: CARDIOVERSION;  Surgeon: Sanda Klein, MD;  Location: [facility];  Service: Cardiovascular;  Laterality: N/A;  . CATARACT EXTRACTION Bilateral 2014   with lens implanted  . COLONOSCOPY N/A 02/19/2015   Procedure: COLONOSCOPY;  Surgeon: Ladene Artist, MD;  Location: [facility]OSCOPY;  Service: Endoscopy;  Laterality: N/A;  . CORONARY ARTERY BYPASS GRAFT  2006   off-pump bypass surgery with LIMA to the LAD and SVG to the second diagonal artery ( performed by Dr Servando Snare)   . JOINT REPLACEMENT Bilateral    L-2004, R-2006  . LEFT HEART CATHETERIZATION WITH CORONARY/GRAFT ANGIOGRAM N/A 09/17/2014   Procedure: LEFT HEART CATHETERIZATION WITH Beatrix Fetters;  Surgeon: Troy Sine, MD;  Location: [facility];  Service: Cardiovascular;  Laterality: N/A;  . REIMPLANTATION OF TOTAL KNEE Right  10/08/2014   Procedure: REIMPLANTATION OF RIGHT TOTAL KNEE ARTHROPLASTY WITH REMOVAL OF ANTIBIOTIC SPACER;  Surgeon: Paralee Cancel, MD;  Location: [facility];  Service: Orthopedics;  Laterality: Right;  . ROTATOR CUFF REPAIR Bilateral    r-1999, l- 2005  . TEE WITHOUT CARDIOVERSION N/A 02/28/2014   Procedure: TRANSESOPHAGEAL ECHOCARDIOGRAM (TEE);  Surgeon: Sanda Klein, MD;  Location: [facility];  Service: Cardiovascular;  Laterality: N/A;  . TOTAL KNEE ARTHROPLASTY Right 07/02/2014   Procedure: Resection of Infected Right Total Knee Arthroplasty with placement antibiotic spacer;  Surgeon: Mauri Pole, MD;  Location: [facility];  Service: Orthopedics;  Laterality: Right;    Outpatient Medications Prior to Visit  Medication Sig Dispense Refill  . acetaminophen (TYLENOL) 500 MG tablet Take 500-1,000 mg by mouth every 6 (six) hours as needed for moderate pain or headache.    . ALPRAZolam (XANAX) 0.5 MG tablet TAKE 1 TABLET BY MOUTH TWICE A DAY AS NEEDED FOR ANXIETY 60 tablet 5  . apixaban (ELIQUIS) 2.5 MG TABS tablet Take 1 tablet (2.5 mg total) 2 (two) times daily by mouth. 28 tablet 0  . diphenhydrAMINE (BENADRYL) 25 MG tablet Take 25 mg by mouth at bedtime.     . diphenhydramine-acetaminophen (TYLENOL PM) 25-500 MG TABS tablet Take 1 tablet by mouth at bedtime.    . furosemide (LASIX) 20 MG tablet TAKE 1 TABLET BY MOUTH EVERY DAY 90 tablet 0  . gabapentin (NEURONTIN) 100 MG capsule TAKE 1 CAPSULE (100 MG TOTAL) BY MOUTH 3 (THREE) TIMES DAILY. 270 capsule 3  . Iron-Vitamins (GERITOL COMPLETE PO) Take 1 tablet by mouth daily.    . isosorbide mononitrate (IMDUR) 30 MG 24 hr tablet TAKE 1 TABLET BY MOUTH EVERY DAY 90 tablet 3  . metoprolol succinate (TOPROL-XL) 25 MG 24 hr tablet Take 1 tablet (25 mg total) by mouth daily. 90 tablet 3  . nitroGLYCERIN (NITROSTAT) 0.4 MG SL tablet PLACE 1 TABLET (0.4 MG TOTAL) UNDER THE TONGUE EVERY 5 (FIVE) MINUTES AS NEEDED FOR CHEST PAIN. X 3 doses 25 tablet 1  .  Omega-3 Fatty Acids (FISH OIL) 1000 MG CAPS Take 1-2 capsules by mouth 2 (two) times daily. Takes one in the morning and two capsules at night.    Marland Kitchen omeprazole (PRILOSEC) 20 MG capsule TAKE ONE CAPSULE BY MOUTH EVERY DAY 90 capsule 3  . polyethylene glycol powder (GLYCOLAX/MIRALAX) powder MIX 17 G AND TAKE BY MOUTH DAILY. 527 g 12  . rOPINIRole (REQUIP) 0.25 MG tablet TAKE 1 TABLET BY MOUTH 3 TIMES A DAY (Patient taking differently: TAKE 0.25MG  BY MOUTH ONCE DAILY) 270 tablet 3  . simvastatin (ZOCOR) 40 MG tablet TAKE 1/2 A TABLET BY MOUTH AT BEDTIME. 45 tablet 0  . traMADol (ULTRAM) 50 MG tablet TAKE 1 TABLET BY MOUTH TWICE A DAY AS NEEDED FOR PAIN 60 tablet 5  . traZODone (DESYREL) 100 MG tablet TAKE 1 TABLET (100 MG TOTAL) BY MOUTH AT BEDTIME. 90 tablet 3  . hydrALAZINE (APRESOLINE) 25 MG  tablet TAKE 1/2 TABLET BY MOUTH 3 TIMES A DAY (Patient not taking: Reported on 07/08/2017) 45 tablet 5   No facility-administered medications prior to visit.      Allergies:   Atorvastatin; Crestor [rosuvastatin]; Morphine and related; and Prednisone   Social History   Socioeconomic History  . Marital status: Widowed    Spouse name: None  . Number of children: None  . Years of education: None  . Highest education level: None  Social Needs  . Financial resource strain: None  . Food insecurity - worry: None  . Food insecurity - inability: None  . Transportation needs - medical: None  . Transportation needs - non-medical: None  Occupational History  . None  Tobacco Use  . Smoking status: Never Smoker  . Smokeless tobacco: Never Used  Substance and Sexual Activity  . Alcohol use: No  . Drug use: No  . Sexual activity: No  Other Topics Concern  . 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 "I believe Jesus died on the cross to save me from my sins. I am saved by the grace of God." 05/11/2017   Other: "I have a good life and am happy. My family is there when I need help." 05/11/2017   L. Ducatte, RN, BSN                                                                                                   Family History:  The patient's family history includes Arthritis in her brother; Cancer in her brother; Heart attack in her brother; Parkinson's disease in her mother.   ROS:   Please see the history of present illness.    ROS All other systems reviewed and are negative.   PHYSICAL EXAM:   VS:  BP 122/60   Ht 5\' 1"  (1.549 m)   Wt 132 lb 9.6 oz (60.1 kg)   BMI 25.05 kg/m     General: Alert, oriented x3, no distress, comfortable Head: no evidence of trauma, PERRL, EOMI, no exophtalmos or lid lag, no myxedema, no xanthelasma; normal ears, nose and oropharynx Neck: normal jugular venous pulsations and no hepatojugular reflux; brisk carotid pulses without delay and no carotid bruits Chest: clear to auscultation, no signs of consolidation by percussion or palpation, normal fremitus, symmetrical and full respiratory excursions Cardiovascular: normal position and quality of the apical impulse, regular rhythm, normal first and second heart sounds, no murmurs, rubs or gallops Abdomen: no tenderness or distention, no masses by palpation, no abnormal pulsatility or arterial bruits, normal bowel sounds, no hepatosplenomegaly Extremities: no clubbing, cyanosis or  edema; 2+ radial, ulnar and brachial pulses bilaterally; 2+ right femoral, posterior tibial and dorsalis pedis pulses; 2+ left  femoral, posterior tibial and dorsalis pedis pulses; no subclavian or femoral bruits Neurological: grossly nonfocal Psych: Normal mood and affect   Wt Readings from Last 3 Encounters:  07/08/17 132 lb 9.6 oz (60.1 kg)  06/16/17 134 lb 12.8 oz (61.1 kg)  05/11/17 136 lb 9.6 oz (62 kg)      Studies/Labs Reviewed:   EKG:  EKG is ordered today.  Sinus bradycardia, normal tracing, QTC 402 ms  Recent Labs: 03/08/2017: B Natriuretic Peptide 646.4; Magnesium 2.0; TSH 3.622 06/16/2017: BUN 19; Creatinine, Ser 1.43; Hemoglobin 11.1; Platelets 164; Potassium 4.0; Sodium 143   Lipid Panel    Component Value Date/Time   CHOL 149 04/08/2016 1153   TRIG 130 04/08/2016 1153   HDL 53 04/08/2016 1153   CHOLHDL 2.8 04/08/2016 1153   VLDL 26 04/08/2016 1153   LDLCALC 70 04/08/2016 1153    ASSESSMENT:     1. PAF (paroxysmal atrial fibrillation) ([place])   2. Long term current use of anticoagulant   3. Coronary artery disease of native artery of native heart with stable angina pectoris ([place])   4. Chronic diastolic congestive heart failure, NYHA class 2 ([place])   5. Pulmonary arterial hypertension ([place])   6. CKD (chronic kidney disease) stage 3, GFR 30-59 ml/min ([facility])   7. HYPERCHOLESTEROLEMIA      PLAN:  In order of problems listed above:  1. AFib/flutter: Without clinically evident recurrence, although she was never really aware of palpitations. CHADSVasc 5 (age 84, gender, CAD, CHF) 2. Eliquis: Remote history of GI bleeding when on anticoagulation, but none since we restarted her Eliquis last summer 3. CAD: No angina pectoris.  Some of her previous episodes of chest pain could actually have represented nitroglycerin responsive elevation in pulmonary artery pressures.  Doing well on beta-blocker and nitrate combo.  Most recent lipid profile showed all lipid parameters within target range. 4. CHF: 130 pounds on her home scale seems to mark euvolemia.  Our office scale over estimates this by  about 2 pounds.  She is doing an excellent job monitoring her fluid status and avoiding excessive sodium in her diet.  I asked her to call if she develops weight gain greater than 3 pounds 1 night or 5 pounds in 1 week. 5. PAH: Largely explained by elevation of the artery wedge pressure/diastolic heart failure, although there may have been a separate component of arteriolar hypertension. 6. CKD: need to avoid excessive diuresis due to risk of renal insufficiency worsening.  7. HLP: She tells me shewill be seeing Dr. Andria Frames every 3 months and has blood work done through his office.  We will schedule her for another appointment in a year, but asked her to call sooner if she develops problems with shortness of breath, palpitations, bleeding issues, etc.   Medication Adjustments/Labs and Tests Ordered: Current medicines are reviewed at length with the patient today.  Concerns regarding medicines are outlined above.  Medication changes, Labs and Tests ordered today are listed in the Patient Instructions below. Patient Instructions  Dr [name] recommends that you schedule a follow-up appointment in 12 months. You will receive a reminder letter in the mail two months in advance. If you don't receive a letter, please call our office to schedule the follow-up appointment.  If you need a refill on your cardiac medications before your next appointment, please call your pharmacy.  Signed, Sanda Klein, MD  07/08/2017 11:35 AM    [facility] [address], [place], [place]  [address] Phone: [phone]; Fax: [phone]

## 2017-07-12 ENCOUNTER — Other Ambulatory Visit: Payer: Self-pay | Admitting: Cardiovascular Disease

## 2017-07-12 ENCOUNTER — Other Ambulatory Visit: Payer: Self-pay | Admitting: Family Medicine

## 2017-07-12 DIAGNOSIS — H16223 Keratoconjunctivitis sicca, not specified as Sjogren's, bilateral: Secondary | ICD-10-CM | POA: Diagnosis not present

## 2017-07-12 NOTE — Telephone Encounter (Signed)
REFILL 

## 2017-07-21 ENCOUNTER — Encounter (HOSPITAL_COMMUNITY): Payer: Self-pay | Admitting: *Deleted

## 2017-07-21 ENCOUNTER — Emergency Department (HOSPITAL_COMMUNITY): Payer: PPO

## 2017-07-21 ENCOUNTER — Other Ambulatory Visit: Payer: Self-pay

## 2017-07-21 ENCOUNTER — Inpatient Hospital Stay (HOSPITAL_COMMUNITY)
Admission: EM | Admit: 2017-07-21 | Discharge: 2017-07-23 | DRG: 309 | Disposition: A | Discharge status: Home / Self Care | Payer: PPO | Attending: Family Medicine | Admitting: Family Medicine

## 2017-07-21 DIAGNOSIS — Z7901 Long term (current) use of anticoagulants: Secondary | ICD-10-CM | POA: Diagnosis not present

## 2017-07-21 DIAGNOSIS — I2721 Secondary pulmonary arterial hypertension: Secondary | ICD-10-CM | POA: Diagnosis not present

## 2017-07-21 DIAGNOSIS — E7800 Pure hypercholesterolemia, unspecified: Secondary | ICD-10-CM | POA: Diagnosis present

## 2017-07-21 DIAGNOSIS — I700 Atherosclerosis of aorta: Secondary | ICD-10-CM | POA: Diagnosis present

## 2017-07-21 DIAGNOSIS — Z96651 Presence of right artificial knee joint: Secondary | ICD-10-CM | POA: Diagnosis not present

## 2017-07-21 DIAGNOSIS — I5032 Chronic diastolic (congestive) heart failure: Secondary | ICD-10-CM | POA: Diagnosis not present

## 2017-07-21 DIAGNOSIS — I43 Cardiomyopathy in diseases classified elsewhere: Secondary | ICD-10-CM | POA: Diagnosis present

## 2017-07-21 DIAGNOSIS — I119 Hypertensive heart disease without heart failure: Secondary | ICD-10-CM | POA: Diagnosis present

## 2017-07-21 DIAGNOSIS — I361 Nonrheumatic tricuspid (valve) insufficiency: Secondary | ICD-10-CM | POA: Diagnosis not present

## 2017-07-21 DIAGNOSIS — Z8249 Family history of ischemic heart disease and other diseases of the circulatory system: Secondary | ICD-10-CM

## 2017-07-21 DIAGNOSIS — R079 Chest pain, unspecified: Secondary | ICD-10-CM | POA: Diagnosis not present

## 2017-07-21 DIAGNOSIS — I130 Hypertensive heart and chronic kidney disease with heart failure and stage 1 through stage 4 chronic kidney disease, or unspecified chronic kidney disease: Secondary | ICD-10-CM | POA: Diagnosis not present

## 2017-07-21 DIAGNOSIS — M150 Primary generalized (osteo)arthritis: Secondary | ICD-10-CM | POA: Diagnosis not present

## 2017-07-21 DIAGNOSIS — E876 Hypokalemia: Secondary | ICD-10-CM | POA: Diagnosis present

## 2017-07-21 DIAGNOSIS — I2510 Atherosclerotic heart disease of native coronary artery without angina pectoris: Secondary | ICD-10-CM

## 2017-07-21 DIAGNOSIS — M810 Age-related osteoporosis without current pathological fracture: Secondary | ICD-10-CM | POA: Diagnosis present

## 2017-07-21 DIAGNOSIS — I208 Other forms of angina pectoris: Secondary | ICD-10-CM

## 2017-07-21 DIAGNOSIS — K219 Gastro-esophageal reflux disease without esophagitis: Secondary | ICD-10-CM | POA: Diagnosis not present

## 2017-07-21 DIAGNOSIS — R7989 Other specified abnormal findings of blood chemistry: Secondary | ICD-10-CM | POA: Diagnosis not present

## 2017-07-21 DIAGNOSIS — G629 Polyneuropathy, unspecified: Secondary | ICD-10-CM | POA: Diagnosis present

## 2017-07-21 DIAGNOSIS — G4700 Insomnia, unspecified: Secondary | ICD-10-CM | POA: Diagnosis not present

## 2017-07-21 DIAGNOSIS — R072 Precordial pain: Secondary | ICD-10-CM | POA: Diagnosis not present

## 2017-07-21 DIAGNOSIS — I480 Paroxysmal atrial fibrillation: Secondary | ICD-10-CM | POA: Diagnosis not present

## 2017-07-21 DIAGNOSIS — R946 Abnormal results of thyroid function studies: Secondary | ICD-10-CM | POA: Diagnosis not present

## 2017-07-21 DIAGNOSIS — I10 Essential (primary) hypertension: Secondary | ICD-10-CM | POA: Diagnosis not present

## 2017-07-21 DIAGNOSIS — N183 Chronic kidney disease, stage 3 (moderate): Secondary | ICD-10-CM | POA: Diagnosis not present

## 2017-07-21 DIAGNOSIS — R000 Tachycardia, unspecified: Secondary | ICD-10-CM | POA: Diagnosis not present

## 2017-07-21 DIAGNOSIS — E039 Hypothyroidism, unspecified: Secondary | ICD-10-CM | POA: Diagnosis present

## 2017-07-21 DIAGNOSIS — I25118 Atherosclerotic heart disease of native coronary artery with other forms of angina pectoris: Secondary | ICD-10-CM | POA: Diagnosis not present

## 2017-07-21 DIAGNOSIS — E785 Hyperlipidemia, unspecified: Secondary | ICD-10-CM | POA: Diagnosis present

## 2017-07-21 DIAGNOSIS — I4891 Unspecified atrial fibrillation: Secondary | ICD-10-CM | POA: Diagnosis present

## 2017-07-21 DIAGNOSIS — Z951 Presence of aortocoronary bypass graft: Secondary | ICD-10-CM | POA: Diagnosis present

## 2017-07-21 LAB — I-STAT TROPONIN, ED: Troponin i, poc: 0 ng/mL (ref 0.00–0.08)

## 2017-07-21 LAB — BASIC METABOLIC PANEL
ANION GAP: 8 (ref 5–15)
BUN: 18 mg/dL (ref 6–20)
CHLORIDE: 107 mmol/L (ref 101–111)
CO2: 25 mmol/L (ref 22–32)
CREATININE: 1.45 mg/dL — AB (ref 0.44–1.00)
Calcium: 9.7 mg/dL (ref 8.9–10.3)
GFR calc non Af Amer: 32 mL/min — ABNORMAL LOW (ref >60)
GFR, EST AFRICAN AMERICAN: 37 mL/min — AB (ref >60)
GLUCOSE: 126 mg/dL — AB (ref 65–99)
Potassium: 3.4 mmol/L — ABNORMAL LOW (ref 3.5–5.1)
Sodium: 140 mmol/L (ref 135–145)

## 2017-07-21 LAB — CBC
HCT: 35.1 % — ABNORMAL LOW (ref 36.0–46.0)
HEMOGLOBIN: 11.8 g/dL — AB (ref 12.0–15.0)
MCH: 31.1 pg (ref 26.0–34.0)
MCHC: 33.6 g/dL (ref 30.0–36.0)
MCV: 92.6 fL (ref 78.0–100.0)
Platelets: 170 10*3/uL (ref 150–400)
RBC: 3.79 MIL/uL — AB (ref 3.87–5.11)
RDW: 14.4 % (ref 11.5–15.5)
WBC: 3.9 10*3/uL — ABNORMAL LOW (ref 4.0–10.5)

## 2017-07-21 MED ORDER — DILTIAZEM LOAD VIA INFUSION
10.0000 mg | Freq: Once | INTRAVENOUS | Status: AC
  Administered 2017-07-21: 10 mg via INTRAVENOUS
  Filled 2017-07-21: qty 10

## 2017-07-21 MED ORDER — APIXABAN 2.5 MG PO TABS
2.5000 mg | ORAL_TABLET | Freq: Two times a day (BID) | ORAL | Status: DC
  Administered 2017-07-22 – 2017-07-23 (×4): 2.5 mg via ORAL
  Filled 2017-07-21 (×5): qty 1

## 2017-07-21 MED ORDER — DILTIAZEM HCL-DEXTROSE 100-5 MG/100ML-% IV SOLN (PREMIX)
5.0000 mg/h | INTRAVENOUS | Status: DC
  Administered 2017-07-21: 5 mg/h via INTRAVENOUS
  Administered 2017-07-21 – 2017-07-22 (×2): 7.5 mg/h via INTRAVENOUS
  Filled 2017-07-21 (×3): qty 100

## 2017-07-21 MED ORDER — ALPRAZOLAM 0.5 MG PO TABS
0.5000 mg | ORAL_TABLET | Freq: Every evening | ORAL | Status: DC | PRN
  Administered 2017-07-22 (×2): 0.5 mg via ORAL
  Filled 2017-07-21 (×2): qty 1

## 2017-07-21 NOTE — ED Provider Notes (Signed)
Emergency Department Provider Note   I have reviewed the triage vital signs and the nursing notes.   HISTORY  Chief Complaint Chest Pain   HPI Jacqueline [name] is a 83 y.o. female with PMH of A-fib on Eliquis, GERD, HLD, HTN, and CHF to the emergency department for evaluation of chest tightness radiating to the jaw with some associated shortness of breath.  Symptoms began approximately 2 hours prior to ED presentation.  Patient states that she fell asleep watching a movie and the pain woke her from sleep.  No lightheadedness or near syncope symptoms.  No recent fevers or chills.  No productive cough.  Her jaw pain has resolved but her chest tightness remains.  It radiates slightly to her back.  She has a Jacqueline [name] history of A. fib and has been compliant with her Eliquis and Metoprolol. No modifying factors for the CP.    Past Medical History:  Diagnosis Date  . Arthritis    back  . Atrial fibrillation with rapid ventricular response ([place]) 02/2014   a. CHA2DS2VASc = 6 -> on eliquis;  b. 02/2014 s/p DCCV;  c. 08/2014 Echo: EF 55-60%, Gr 2 DD, mild MR, triv AI.  Marland Kitchen CAD (coronary artery disease)    a. s/p CABG x 2 (LIMA->LAD, VG->Diag);  b. 08/2014 Cath: LM nl, LAD 90p, LCX nl, RCA nl/dominant, LIMA->LAD atretic, VG->D2 patent w/ retrograde filling of LAD, EF 55-60%-->Med Rx.  . Diverticulitis 02/21/2012  . GERD (gastroesophageal reflux disease)   . Hyperlipemia   . Hypertension   . Insomnia     Patient Active Problem List   Diagnosis Date Noted  . Atrial fibrillation with RVR ([place]) 07/21/2017  . Jacqueline [name] term current use of anticoagulant 07/08/2017  . Hereditary and idiopathic peripheral neuropathy 12/12/2015  . CHF (congestive heart failure), NYHA class II ([place]) 06/05/2015  . Pulmonary arterial hypertension ([place]) 05/17/2015  . Anemia of chronic disease   . Tachy-brady syndrome ([place]) 05/06/2015  . CAD s/p CABG ([place]) 05/06/2015  . Anemia due to chronic blood loss 10/10/2014  . S/P knee  replacement 10/08/2014  . PAF (paroxysmal atrial fibrillation) ([place]) 09/24/2014  . Osteoporosis 03/22/2014  . GERD (gastroesophageal reflux disease)   . CKD (chronic kidney disease) stage 3, GFR 30-59 ml/min ([facility]) 12/22/2013  . Abnormal thyroid blood test 12/22/2013  . HYPERCHOLESTEROLEMIA 09/23/2006  . HYPERTENSION, BENIGN SYSTEMIC 09/23/2006  . Coronary atherosclerosis 09/23/2006  . RHINITIS, ALLERGIC 09/23/2006  . REFLUX ESOPHAGITIS 09/23/2006  . DIVERTICULOSIS OF COLON 09/23/2006  . Osteoarthritis, multiple sites 09/23/2006  . BACK PAIN, LOW 09/23/2006  . INSOMNIA NOS 09/23/2006    Past Surgical History:  Procedure Laterality Date  . ABDOMINAL HYSTERECTOMY  1975  . CARDIAC CATHETERIZATION N/A 05/09/2015   Procedure: Right Heart Cath;  Surgeon: Larey Dresser, MD;  Location: [facility] [place] [facility];  Service: Cardiovascular;  Laterality: N/A;  . CARDIOVERSION N/A 02/28/2014   Procedure: CARDIOVERSION;  Surgeon: Sanda Klein, MD;  Location: [place];  Service: Cardiovascular;  Laterality: N/A;  . CATARACT EXTRACTION Bilateral 2014   with lens implanted  . COLONOSCOPY N/A 02/19/2015   Procedure: COLONOSCOPY;  Surgeon: Ladene Artist, MD;  Location: [facility];  Service: Endoscopy;  Laterality: N/A;  . CORONARY ARTERY BYPASS GRAFT  2006   off-pump bypass surgery with LIMA to the LAD and SVG to the second diagonal artery ( performed by Dr Servando Snare)   . JOINT REPLACEMENT Bilateral    L-2004, R-2006  . LEFT HEART CATHETERIZATION WITH  CORONARY/GRAFT ANGIOGRAM N/A 09/17/2014   Procedure: LEFT HEART CATHETERIZATION WITH Beatrix Fetters;  Surgeon: Troy Sine, MD;  Location: [facility];  Service: Cardiovascular;  Laterality: N/A;  . REIMPLANTATION OF TOTAL KNEE Right 10/08/2014   Procedure: REIMPLANTATION OF RIGHT TOTAL KNEE ARTHROPLASTY WITH REMOVAL OF ANTIBIOTIC SPACER;  Surgeon: Paralee Cancel, MD;  Location: [facility];  Service: Orthopedics;  Laterality: Right;  . ROTATOR  CUFF REPAIR Bilateral    r-1999, l- 2005  . TEE WITHOUT CARDIOVERSION N/A 02/28/2014   Procedure: TRANSESOPHAGEAL ECHOCARDIOGRAM (TEE);  Surgeon: Sanda Klein, MD;  Location: [place];  Service: Cardiovascular;  Laterality: N/A;  . TOTAL KNEE ARTHROPLASTY Right 07/02/2014   Procedure: Resection of Infected Right Total Knee Arthroplasty with placement antibiotic spacer;  Surgeon: Mauri Pole, MD;  Location: [facility];  Service: Orthopedics;  Laterality: Right;    Current Outpatient Rx  . Order #: [id number] Class: Historical Med  . Order #: [id number] Class: Phone In  . Order #: [id number] Class: Sample  . Order #: [id number] Class: Historical Med  . Order #: [id number] Class: Historical Med  . Order #: [id number] Class: Normal  . Order #: [id number] Class: Normal  . Order #: [id number] Class: Historical Med  . Order #: [id number] Class: Normal  . Order #: [id number] Class: Normal  . Order #: [id number] Class: Normal  . Order #: [id number] Class: Historical Med  . Order #: [id number] Class: Normal  . Order #: [id number] Class: Normal  . Order #: [id number] Class: Normal  . Order #: [id number] Class: Normal  . Order #: [id number] Class: Normal  . Order #: [id number] Class: Normal    Allergies Atorvastatin; Crestor [rosuvastatin]; Morphine and related; and Prednisone  Family History  Problem Relation Age of Onset  . Parkinson's disease Mother   . Heart attack Brother   . Arthritis Brother   . Cancer Brother        Unknown  . Breast cancer Neg Hx     Social History Social History   Tobacco Use  . Smoking status: Never Smoker  . Smokeless tobacco: Never Used  Substance Use Topics  . Alcohol use: No  . Drug use: No    Review of Systems  Constitutional: No fever/chills Eyes: No visual changes. ENT: No sore throat. Cardiovascular: Positive chest pain radiating to the jaw.  Respiratory: Positive shortness of breath. Gastrointestinal: No abdominal pain.  No nausea, no vomiting.  No diarrhea.  No  constipation. Genitourinary: Negative for dysuria. Musculoskeletal: Negative for back pain. Skin: Negative for rash. Neurological: Negative for headaches, focal weakness or numbness.  10-point ROS otherwise negative.  ____________________________________________   PHYSICAL EXAM:  VITAL SIGNS: ED Triage Vitals  Enc Vitals Group     BP 07/21/17 1925 128/86     Pulse Rate 07/21/17 1925 (!) 116     Resp 07/21/17 1925 16     Temp 07/21/17 1925 97.8 F (36.6 C)     Temp Source 07/21/17 1925 Oral     SpO2 07/21/17 1925 100 %     Pain Score 07/21/17 1928 8   Constitutional: Alert and oriented. Well appearing and in no acute distress. Eyes: Conjunctivae are normal.  Head: Atraumatic. Nose: No congestion/rhinnorhea. Mouth/Throat: Mucous membranes are moist.  Neck: No stridor.  Cardiovascular: A-fib with RVR. Good peripheral circulation. Grossly normal heart sounds.   Respiratory: Normal respiratory effort.  No retractions. Lungs CTAB. Gastrointestinal: Soft and nontender. No distention.  Musculoskeletal: No lower extremity tenderness nor edema. No gross deformities of extremities. Neurologic:  Normal speech and language. No gross focal neurologic deficits are appreciated.  Skin:  Skin is warm, dry and intact. No rash noted.  ____________________________________________   LABS (all labs ordered are listed, but only abnormal results are displayed)  Labs Reviewed  BASIC METABOLIC PANEL - Abnormal; Notable for the following components:      Result Value   Potassium 3.4 (*)    Glucose, Bld 126 (*)    Creatinine, Ser 1.45 (*)    GFR calc non Af Amer 32 (*)    GFR calc Af Amer 37 (*)    All other components within normal limits  CBC - Abnormal; Notable for the following components:   WBC 3.9 (*)    RBC 3.79 (*)    Hemoglobin 11.8 (*)    HCT 35.1 (*)    All other components within normal limits  I-STAT TROPONIN, ED   ____________________________________________  EKG    EKG Interpretation  Date/Time:  Wednesday July 21 2017 19:22:27 EST Ventricular Rate:  127 PR Interval:    QRS Duration: 84 QT Interval:  278 QTC Calculation: 404 R Axis:   60 Text Interpretation:  Atrial fibrillation with rapid ventricular response Abnormal ECG No STEMI.  Confirmed by Nanda Quinton [phone]) on 07/21/2017 8:11:52 PM      ____________________________________________  RADIOLOGY  Dg Chest 2 View  Result Date: 07/21/2017 CLINICAL DATA:  83 year old female with chest pain. EXAM: CHEST  2 VIEW COMPARISON:  Chest radiograph dated 03/08/2017 FINDINGS: The lungs are clear. There is no pleural effusion or pneumothorax. The cardiac silhouette is within normal limits. Median sternotomy wires and CABG vascular clips noted. No acute osseous pathology. There is atherosclerotic calcification of the abdominal aorta. IMPRESSION: No active cardiopulmonary disease. Electronically Signed   By: Anner Crete M.D.   On: 07/21/2017 20:14    ____________________________________________   PROCEDURES  Procedure(s) performed:   Procedures  CRITICAL CARE Performed by: Margette Fast Total critical care time: 40 minutes Critical care time was exclusive of separately billable procedures and treating other patients. Critical care was necessary to treat or prevent imminent or life-threatening deterioration. Critical care was time spent personally by me on the following activities: development of treatment plan with patient and/or surrogate as well as nursing, discussions with consultants, evaluation of patient's response to treatment, examination of patient, obtaining history from patient or surrogate, ordering and performing treatments and interventions, ordering and review of laboratory studies, ordering and review of radiographic studies, pulse oximetry and re-evaluation of patient's condition.  Nanda Quinton, MD Emergency  Medicine  ____________________________________________   INITIAL IMPRESSION / ASSESSMENT AND PLAN / ED COURSE  Pertinent labs & imaging results that were available during my care of the patient were reviewed by me and considered in my medical decision making (see chart for details).  Patient presents emergency department for evaluation of chest tightness radiating to the jaw with some shortness of breath.  Patient arrives in A. fib with RVR.  She is on Eliquis.  EKG shows A. fib RVR with no evidence of acute MI.  I suspect that her pain symptoms are rate related but given her age, risk factors, and description of the discomfort I plan for rate control along with admission for enzyme trending.   Labs and imaging unremarkable. Plan for admission to [facility] Medicine practice.   Discussed patient's case with Family medicine resident to request admission. Patient and family (if present) updated with plan. Care transferred to [facility] Med service.  I reviewed all nursing notes, vitals, pertinent old records, EKGs, labs, imaging (as available).  ____________________________________________  FINAL CLINICAL IMPRESSION(S) / ED DIAGNOSES  Final diagnoses:  Precordial chest pain  Atrial fibrillation with RVR ([place])     MEDICATIONS GIVEN DURING THIS VISIT:  Medications  diltiazem (CARDIZEM) 1 mg/mL load via infusion 10 mg (10 mg Intravenous Bolus from Bag 07/21/17 2146)    And  diltiazem (CARDIZEM) 100 mg in dextrose 5% 121mL (1 mg/mL) infusion (7.5 mg/hr Intravenous New Bag/Given 07/21/17 2307)  ALPRAZolam (XANAX) tablet 0.5 mg (not administered)  apixaban (ELIQUIS) tablet 2.5 mg (not administered)    Note:  This document was prepared using Dragon voice recognition software and may include unintentional dictation errors.  Nanda Quinton, MD Emergency Medicine    Deniya Craigo, [name], MD 07/21/17 2322

## 2017-07-21 NOTE — ED Notes (Signed)
Assisted pt. With commode, pt. Urinated light yellow, not odorous, 300 ml in volume

## 2017-07-21 NOTE — ED Notes (Signed)
Patient transported to X-ray 

## 2017-07-21 NOTE — H&P (Signed)
[facility] Admission History and Physical Service Pager: [phone]  Patient name: Jacqueline [name] Medical record number: [id number] Date of birth: 03-14-1931 Age: 83 y.o. Gender: female  Primary Care Provider: [name], MD Consultants: Cardiology Code Status: Full  Chief Complaint: chest pain  Assessment and Plan: Jacqueline [name] is a 83 y.o. female presenting with chest pain . PMH is significant for chronic A-fib on anticoagulation, HTN, CKD stage III, GERD, OA, and CHF  A fib with RVR/ chest pain: Patient woken from sleep with chest pain that radiated to her jaw and back. On admission to the ED patient was found to be in atrial fibrillation with RVR and was started on a diltiazem drip after a loading dose. Her rate is now improved. She is on eliquis and metoprolol at home for her A-fib and was recently admitted in August for PAF with RVR. She did not require cardioversion. CHADS VASC score is 6. Chest pain thought to be due to PAF with RVR but will trend troponin, and initial was 0.00. CXR unremarkable for any acute process. -Admit to FPTS, telemetry, attending Dr. Dorcas Mcmurray -Vitals per unit -Trend troponins -continue diltiazem drip  - Hold home metoprolol while on dilt drip -Mag, TSH pending, K 3.4 -Consult cardiology in AM as she is hemodynamically stable on admission -continue home eliquis for anticoagulation  CHF: Does not appear fluid overloaded on exam. Will continue home dose of lasix 20 mg daily. Her normal weight is ~130 lb. ECHO from 04/2015 prior to heart cath with EF 60-65% and G2DD. PA peak pressure 85 mm Hg.  -Daily weights -Strict I's/O's  Coronary artery disease s/p CABG: Chest pain radiating to jaw and back are new for patient. Pain likely related to her atrial fibrillation with RVR.  -Continue home Imdur and simvastatin  -Trend troponins -CXR shows no active cardiopulmonary disease - Cards consult in AM  HTN: BP well controlled  while in the ED. Will continue home meds. -Continue home Imdur 30 mg daily, Lasix 20 mg daily  CKD, stage III: Cr is 1.45 which appears to be patient's baseline. Will continue home medications and monitor.  -Monitor on BMP -Continue home Lasix  GERD: Chronic. Denies any heartburn in ED. -Continue home omeprazole  OA: Chronic. In lower back.  -Continue home tramadol 50 mg BID prn  Neuropathy in feet:  - Continue home Gabapentin  Hypokalemia: Mild. L 3.4 - 40 KDUR once - checking magnesium as above   FEN/GI: heart healthy diet, SLIV Prophylaxis: Eliquis  Disposition: admit to telemetry  History of Present Illness:  Jacqueline [name] is a 83 y.o. female presenting with jaw and chest pain.   Patient notes that she was sleeping earlier tonight in her chair watching TV, she awoke from sleep with centralized chest tightness and radiation to the jaw. This happened around [place]. She felt like her heart was racing. Also admits to some shortness of breath. She took a dose of Nitro without any relief of her back and chest pain, but some relief of her jaw pain. She notes that she has been admitted to the hospital before for A-fib in August and converted prior to being shocked. She is on metoprolol and eliquis for her A-fib.    Patient admits to taking Xanax at bedtime to help her sleep, sometimes she will need a 1/2 pills a day. She admits to taking all her morning medications but did not take her nighttime medications. Admits to blurry vision,  has hx of this. She is supposed to pick up her new glasses tomorrow. Admits to an episode of loose stool today. Denies any fevers, leg edema, orthopnea, nausea, vomiting, constipation, abdominal pain, headaches.   Lives at her house alone but is letting her granddaughter stay with her. She still drives and ambulates independently. Performs all her IADLS and ADLS.   Review Of Systems: Per HPI with the following additions: see HPI  Review of Systems   Constitutional: Negative for chills, diaphoresis and fever.  HENT: Negative for congestion.   Eyes: Positive for blurred vision. Negative for double vision.  Respiratory: Positive for shortness of breath.   Cardiovascular: Positive for chest pain and palpitations. Negative for orthopnea and leg swelling.  Gastrointestinal: Positive for diarrhea. Negative for constipation, heartburn, nausea and vomiting.  Musculoskeletal: Positive for joint pain.  Neurological: Negative for headaches.    Patient Active Problem List   Diagnosis Date Noted  . Long term current use of anticoagulant 07/08/2017  . Hereditary and idiopathic peripheral neuropathy 12/12/2015  . CHF (congestive heart failure), NYHA class II ([place]) 06/05/2015  . Pulmonary arterial hypertension ([place]) 05/17/2015  . Anemia of chronic disease   . Tachy-brady syndrome ([place]) 05/06/2015  . CAD s/p CABG ([place]) 05/06/2015  . Anemia due to chronic blood loss 10/10/2014  . S/P knee replacement 10/08/2014  . PAF (paroxysmal atrial fibrillation) ([place]) 09/24/2014  . Osteoporosis 03/22/2014  . GERD (gastroesophageal reflux disease)   . CKD (chronic kidney disease) stage 3, GFR 30-59 ml/min ([facility]) 12/22/2013  . Abnormal thyroid blood test 12/22/2013  . HYPERCHOLESTEROLEMIA 09/23/2006  . HYPERTENSION, BENIGN SYSTEMIC 09/23/2006  . Coronary atherosclerosis 09/23/2006  . RHINITIS, ALLERGIC 09/23/2006  . REFLUX ESOPHAGITIS 09/23/2006  . DIVERTICULOSIS OF COLON 09/23/2006  . Osteoarthritis, multiple sites 09/23/2006  . BACK PAIN, LOW 09/23/2006  . INSOMNIA NOS 09/23/2006    Past Medical History: Past Medical History:  Diagnosis Date  . Arthritis    back  . Atrial fibrillation with rapid ventricular response ([place]) 02/2014   a. CHA2DS2VASc = 6 -> on eliquis;  b. 02/2014 s/p DCCV;  c. 08/2014 Echo: EF 55-60%, Gr 2 DD, mild MR, triv AI.  Marland Kitchen CAD (coronary artery disease)    a. s/p CABG x 2 (LIMA->LAD, VG->Diag);  b. 08/2014 Cath: LM nl, LAD 90p,  LCX nl, RCA nl/dominant, LIMA->LAD atretic, VG->D2 patent w/ retrograde filling of LAD, EF 55-60%-->Med Rx.  . Diverticulitis 02/21/2012  . GERD (gastroesophageal reflux disease)   . Hyperlipemia   . Hypertension   . Insomnia     Past Surgical History: Past Surgical History:  Procedure Laterality Date  . ABDOMINAL HYSTERECTOMY  1975  . CARDIAC CATHETERIZATION N/A 05/09/2015   Procedure: Right Heart Cath;  Surgeon: Larey Dresser, MD;  Location: [facility];  Service: Cardiovascular;  Laterality: N/A;  . CARDIOVERSION N/A 02/28/2014   Procedure: CARDIOVERSION;  Surgeon: Sanda Klein, MD;  Location: [facility];  Service: Cardiovascular;  Laterality: N/A;  . CATARACT EXTRACTION Bilateral 2014   with lens implanted  . COLONOSCOPY N/A 02/19/2015   Procedure: COLONOSCOPY;  Surgeon: Ladene Artist, MD;  Location: [facility];  Service: Endoscopy;  Laterality: N/A;  . CORONARY ARTERY BYPASS GRAFT  2006   off-pump bypass surgery with LIMA to the LAD and SVG to the second diagonal artery ( performed by Dr Servando Snare)   . JOINT REPLACEMENT Bilateral    L-2004, R-2006  . LEFT HEART CATHETERIZATION WITH CORONARY/GRAFT ANGIOGRAM N/A 09/17/2014   Procedure:  LEFT HEART CATHETERIZATION WITH Beatrix Fetters;  Surgeon: Troy Sine, MD;  Location: [facility] C[facility];  Service: Cardiovascular;  Laterality: N/A;  . REIMPLANTATION OF TOTAL KNEE Right 10/08/2014   Procedure: REIMPLANTATION OF RIGHT TOTAL KNEE ARTHROPLASTY WITH REMOVAL OF ANTIBIOTIC SPACER;  Surgeon: Paralee Cancel, MD;  Location: [facility];  Service: Orthopedics;  Laterality: Right;  . ROTATOR CUFF REPAIR Bilateral    r-1999, l- 2005  . TEE WITHOUT CARDIOVERSION N/A 02/28/2014   Procedure: TRANSESOPHAGEAL ECHOCARDIOGRAM (TEE);  Surgeon: Sanda Klein, MD;  Location: [facility];  Service: Cardiovascular;  Laterality: N/A;  . TOTAL KNEE ARTHROPLASTY Right 07/02/2014   Procedure: Resection of Infected Right Total Knee Arthroplasty with  placement antibiotic spacer;  Surgeon: Mauri Pole, MD;  Location: [facility];  Service: Orthopedics;  Laterality: Right;    Social History: Social History   Tobacco Use  . Smoking status: Never Smoker  . Smokeless tobacco: Never Used  Substance Use Topics  . Alcohol use: No  . Drug use: No   Additional social history:   Please also refer to relevant sections of EMR.  Family History: Family History  Problem Relation Age of Onset  . Parkinson's disease Mother   . Heart attack Brother   . Arthritis Brother   . Cancer Brother        Unknown  . Breast cancer Neg Hx     Allergies and Medications: Allergies  Allergen Reactions  . Atorvastatin Other (See Comments)    Myalgias in legs  . Crestor [Rosuvastatin] Other (See Comments)    Myalgias in legs  . Morphine And Related Other (See Comments)    "Crazy thoughts, severe headache, sick"  . Prednisone     On two occasions has caused A fib   No current facility-administered medications on file prior to encounter.    Current Outpatient Medications on File Prior to Encounter  Medication Sig Dispense Refill  . acetaminophen (TYLENOL) 500 MG tablet Take 500-1,000 mg by mouth every 6 (six) hours as needed for moderate pain or headache.    . ALPRAZolam (XANAX) 0.5 MG tablet TAKE 1 TABLET BY MOUTH TWICE A DAY AS NEEDED FOR ANXIETY (Patient taking differently: TAKE 0.5 MG BY MOUTH AT BEDTIME AND 0.25 MG BY MOUTH DAILY  AS NEEDED FOR ANXIETY) 60 tablet 5  . apixaban (ELIQUIS) 2.5 MG TABS tablet Take 1 tablet (2.5 mg total) 2 (two) times daily by mouth. 28 tablet 0  . diphenhydrAMINE (BENADRYL) 25 MG tablet Take 25 mg by mouth daily.     . diphenhydramine-acetaminophen (TYLENOL PM) 25-500 MG TABS tablet Take 1 tablet by mouth at bedtime.    . furosemide (LASIX) 20 MG tablet TAKE 1 TABLET BY MOUTH EVERY DAY 90 tablet 3  . gabapentin (NEURONTIN) 100 MG capsule TAKE 1 CAPSULE (100 MG TOTAL) BY MOUTH 3 (THREE) TIMES DAILY. 270 capsule 3  .  Iron-Vitamins (GERITOL COMPLETE PO) Take 1 tablet by mouth daily.    . isosorbide mononitrate (IMDUR) 30 MG 24 hr tablet TAKE 1 TABLET BY MOUTH EVERY DAY 90 tablet 3  . metoprolol succinate (TOPROL-XL) 25 MG 24 hr tablet Take 1 tablet (25 mg total) by mouth daily. 90 tablet 3  . nitroGLYCERIN (NITROSTAT) 0.4 MG SL tablet PLACE 1 TABLET (0.4 MG TOTAL) UNDER THE TONGUE EVERY 5 (FIVE) MINUTES AS NEEDED FOR CHEST PAIN. X 3 doses 25 tablet 1  . Omega-3 Fatty Acids (FISH OIL) 1000 MG CAPS Take 1-2 capsules by mouth See admin instructions.  Takes one in the morning and two capsules at night.    Marland Kitchen omeprazole (PRILOSEC) 20 MG capsule TAKE ONE CAPSULE BY MOUTH EVERY DAY 90 capsule 3  . polyethylene glycol powder (GLYCOLAX/MIRALAX) powder MIX 17 G AND TAKE BY MOUTH DAILY. (Patient taking differently: MIX 17 G AND TAKE BY MOUTH DAILY PRN) 527 g 12  . rOPINIRole (REQUIP) 0.25 MG tablet TAKE 1 TABLET BY MOUTH 3 TIMES A DAY (Patient taking differently: TAKE 0.25MG  BY MOUTH ONCE DAILY) 270 tablet 3  . simvastatin (ZOCOR) 40 MG tablet TAKE 1/2 A TABLET BY MOUTH AT BEDTIME. (Patient taking differently: TAKE 20 MG BY MOUTH AT BEDTIME.) 45 tablet 0  . traMADol (ULTRAM) 50 MG tablet TAKE 1 TABLET BY MOUTH TWICE A DAY AS NEEDED FOR PAIN 60 tablet 4  . traZODone (DESYREL) 100 MG tablet TAKE 1 TABLET (100 MG TOTAL) BY MOUTH AT BEDTIME. 90 tablet 3    Objective: BP 132/80   Pulse 73   Temp 97.8 F (36.6 C) (Oral)   Resp (!) 26   SpO2 97%  Exam: General: NAD, pleasant Eyes: PERRL, EOMI, no conjunctival pallor or injection ENTM: Moist mucous membranes, no pharyngeal erythema or exudate Neck: Supple, no LAD Cardiovascular: Irregular rhythm, regular rate, no m/r/g, no LE edema Respiratory: CTA BL, normal work of breathing Gastrointestinal: soft, nontender, nondistended, normoactive BS MSK: moves 4 extremities equally Derm: no rashes appreciated Neuro: CN II-XII grossly intact, strength 5/5 in BLUE and BLLE Psych:  AOx3, appropriate affect  Labs and Imaging: CBC BMET  Recent Labs  Lab 07/21/17 1940  WBC 3.9*  HGB 11.8*  HCT 35.1*  PLT 170   Recent Labs  Lab 07/21/17 1940  NA 140  K 3.4*  CL 107  CO2 25  BUN 18  CREATININE 1.45*  GLUCOSE 126*  CALCIUM 9.7     Troponin 0.00  Dg Chest 2 View  Result Date: 07/21/2017 CLINICAL DATA:  83 year old female with chest pain. EXAM: CHEST  2 VIEW COMPARISON:  Chest radiograph dated 03/08/2017 FINDINGS: The lungs are clear. There is no pleural effusion or pneumothorax. The cardiac silhouette is within normal limits. Median sternotomy wires and CABG vascular clips noted. No acute osseous pathology. There is atherosclerotic calcification of the abdominal aorta. IMPRESSION: No active cardiopulmonary disease. Electronically Signed   By: Anner Crete M.D.   On: 07/21/2017 20:14    Shirley, [place], DO 07/21/2017, 11:15 PM PGY-1, [place] [facility] pager: [phone], text pages welcome  I have separately seen and examined the patient. I have discussed the findings and exam with Dr. Enid Derry and agree with the above note.  My changes/additions are outlined in BLUE.   Smitty Cords, MD [place], PGY-3

## 2017-07-21 NOTE — ED Notes (Signed)
Pt and family updated on plan of care; educated on a-fib and why HR changes so rapidly

## 2017-07-21 NOTE — ED Notes (Signed)
Attempted to call report

## 2017-07-21 NOTE — ED Triage Notes (Signed)
Pt says she woke up about 1915 with her heart racing, chest tightness, pain in her jaw and sob. Hr 125-140 in triage. She has a hx of afib, stents and CABG. Pt is on Eliquis.

## 2017-07-22 ENCOUNTER — Inpatient Hospital Stay (HOSPITAL_COMMUNITY): Payer: PPO

## 2017-07-22 ENCOUNTER — Other Ambulatory Visit: Payer: Self-pay

## 2017-07-22 ENCOUNTER — Encounter (HOSPITAL_COMMUNITY): Payer: Self-pay | Admitting: *Deleted

## 2017-07-22 ENCOUNTER — Other Ambulatory Visit (HOSPITAL_COMMUNITY): Payer: PPO

## 2017-07-22 DIAGNOSIS — I361 Nonrheumatic tricuspid (valve) insufficiency: Secondary | ICD-10-CM

## 2017-07-22 DIAGNOSIS — I10 Essential (primary) hypertension: Secondary | ICD-10-CM

## 2017-07-22 DIAGNOSIS — E7800 Pure hypercholesterolemia, unspecified: Secondary | ICD-10-CM

## 2017-07-22 DIAGNOSIS — I2510 Atherosclerotic heart disease of native coronary artery without angina pectoris: Secondary | ICD-10-CM

## 2017-07-22 DIAGNOSIS — I25118 Atherosclerotic heart disease of native coronary artery with other forms of angina pectoris: Secondary | ICD-10-CM

## 2017-07-22 DIAGNOSIS — I480 Paroxysmal atrial fibrillation: Principal | ICD-10-CM

## 2017-07-22 DIAGNOSIS — I4891 Unspecified atrial fibrillation: Secondary | ICD-10-CM

## 2017-07-22 DIAGNOSIS — I700 Atherosclerosis of aorta: Secondary | ICD-10-CM | POA: Diagnosis present

## 2017-07-22 DIAGNOSIS — M150 Primary generalized (osteo)arthritis: Secondary | ICD-10-CM

## 2017-07-22 DIAGNOSIS — I208 Other forms of angina pectoris: Secondary | ICD-10-CM

## 2017-07-22 DIAGNOSIS — E039 Hypothyroidism, unspecified: Secondary | ICD-10-CM | POA: Diagnosis present

## 2017-07-22 DIAGNOSIS — R7989 Other specified abnormal findings of blood chemistry: Secondary | ICD-10-CM

## 2017-07-22 LAB — CBC
HCT: 37 % (ref 36.0–46.0)
HEMOGLOBIN: 12.5 g/dL (ref 12.0–15.0)
MCH: 31.3 pg (ref 26.0–34.0)
MCHC: 33.8 g/dL (ref 30.0–36.0)
MCV: 92.5 fL (ref 78.0–100.0)
Platelets: 171 10*3/uL (ref 150–400)
RBC: 4 MIL/uL (ref 3.87–5.11)
RDW: 14.1 % (ref 11.5–15.5)
WBC: 4.8 10*3/uL (ref 4.0–10.5)

## 2017-07-22 LAB — ECHOCARDIOGRAM COMPLETE
HEIGHTINCHES: 61 in
Weight: 2032 oz

## 2017-07-22 LAB — BASIC METABOLIC PANEL
Anion gap: 13 (ref 5–15)
BUN: 16 mg/dL (ref 6–20)
CALCIUM: 9.6 mg/dL (ref 8.9–10.3)
CHLORIDE: 105 mmol/L (ref 101–111)
CO2: 23 mmol/L (ref 22–32)
CREATININE: 1.23 mg/dL — AB (ref 0.44–1.00)
GFR calc Af Amer: 45 mL/min — ABNORMAL LOW (ref >60)
GFR, EST NON AFRICAN AMERICAN: 39 mL/min — AB (ref >60)
GLUCOSE: 89 mg/dL (ref 65–99)
POTASSIUM: 4.1 mmol/L (ref 3.5–5.1)
SODIUM: 141 mmol/L (ref 135–145)

## 2017-07-22 LAB — TSH: TSH: 5.316 u[IU]/mL — AB (ref 0.350–4.500)

## 2017-07-22 LAB — MAGNESIUM: MAGNESIUM: 1.9 mg/dL (ref 1.7–2.4)

## 2017-07-22 LAB — TROPONIN I

## 2017-07-22 LAB — MRSA PCR SCREENING: MRSA by PCR: NEGATIVE

## 2017-07-22 LAB — T4, FREE: Free T4: 0.94 ng/dL (ref 0.61–1.12)

## 2017-07-22 MED ORDER — TRAZODONE HCL 100 MG PO TABS
100.0000 mg | ORAL_TABLET | Freq: Every day | ORAL | Status: DC
  Administered 2017-07-22 (×2): 100 mg via ORAL
  Filled 2017-07-22 (×2): qty 1

## 2017-07-22 MED ORDER — GABAPENTIN 100 MG PO CAPS
100.0000 mg | ORAL_CAPSULE | Freq: Three times a day (TID) | ORAL | Status: DC
  Administered 2017-07-22 – 2017-07-23 (×5): 100 mg via ORAL
  Filled 2017-07-22 (×5): qty 1

## 2017-07-22 MED ORDER — AMIODARONE HCL 200 MG PO TABS
400.0000 mg | ORAL_TABLET | Freq: Two times a day (BID) | ORAL | Status: DC
  Administered 2017-07-22 – 2017-07-23 (×3): 400 mg via ORAL
  Filled 2017-07-22 (×3): qty 2

## 2017-07-22 MED ORDER — ISOSORBIDE MONONITRATE ER 30 MG PO TB24
30.0000 mg | ORAL_TABLET | Freq: Every day | ORAL | Status: DC
  Administered 2017-07-22 – 2017-07-23 (×2): 30 mg via ORAL
  Filled 2017-07-22 (×2): qty 1

## 2017-07-22 MED ORDER — NITROGLYCERIN 0.4 MG SL SUBL: 0.4000 mg | SUBLINGUAL_TABLET | SUBLINGUAL | Status: DC | PRN

## 2017-07-22 MED ORDER — ONDANSETRON HCL 4 MG/2ML IJ SOLN: 4.0000 mg | Freq: Four times a day (QID) | INTRAMUSCULAR | Status: DC | PRN

## 2017-07-22 MED ORDER — FUROSEMIDE 20 MG PO TABS
20.0000 mg | ORAL_TABLET | Freq: Every day | ORAL | Status: DC
Start: 2017-07-22 — End: 2017-07-23
  Administered 2017-07-22 – 2017-07-23 (×2): 20 mg via ORAL
  Filled 2017-07-22 (×2): qty 1

## 2017-07-22 MED ORDER — ROPINIROLE HCL 0.5 MG PO TABS
0.2500 mg | ORAL_TABLET | Freq: Three times a day (TID) | ORAL | Status: DC
  Administered 2017-07-22 – 2017-07-23 (×5): 0.25 mg via ORAL
  Filled 2017-07-22 (×5): qty 1

## 2017-07-22 MED ORDER — SIMVASTATIN 40 MG PO TABS
40.0000 mg | ORAL_TABLET | Freq: Every day | ORAL | Status: DC
  Administered 2017-07-22 (×2): 40 mg via ORAL
  Filled 2017-07-22 (×2): qty 1

## 2017-07-22 MED ORDER — PANTOPRAZOLE SODIUM 40 MG PO TBEC
40.0000 mg | DELAYED_RELEASE_TABLET | Freq: Every day | ORAL | Status: DC
  Administered 2017-07-22 – 2017-07-23 (×2): 40 mg via ORAL
  Filled 2017-07-22 (×2): qty 1

## 2017-07-22 MED ORDER — POTASSIUM CHLORIDE CRYS ER 20 MEQ PO TBCR
40.0000 meq | EXTENDED_RELEASE_TABLET | Freq: Once | ORAL | Status: AC
  Administered 2017-07-22: 40 meq via ORAL
  Filled 2017-07-22: qty 2

## 2017-07-22 MED ORDER — METOPROLOL SUCCINATE ER 25 MG PO TB24
25.0000 mg | ORAL_TABLET | Freq: Every day | ORAL | Status: DC
  Administered 2017-07-22 – 2017-07-23 (×2): 25 mg via ORAL
  Filled 2017-07-22 (×2): qty 1

## 2017-07-22 MED ORDER — ACETAMINOPHEN 325 MG PO TABS: 650.0000 mg | ORAL_TABLET | ORAL | Status: DC | PRN

## 2017-07-22 MED ORDER — TRAMADOL HCL 50 MG PO TABS
50.0000 mg | ORAL_TABLET | Freq: Two times a day (BID) | ORAL | Status: DC
  Administered 2017-07-22 – 2017-07-23 (×4): 50 mg via ORAL
  Filled 2017-07-22 (×4): qty 1

## 2017-07-22 NOTE — Consult Note (Signed)
Cardiology Consultation:   Patient ID: Jacqueline [name]; [id number]; 06-21-1931   Admit date: 07/21/2017 Date of Consult: 07/22/2017  Primary Care Provider: [name], MD Primary Cardiologist: Dr. [name]   Patient Profile:   Jacqueline [name] is a 83 y.o. female with a hx of CAD s/p CABG, paroxysmal atrial fibrillation/flutter requring cardioversion in 2015, on Eliquis for anticoagulation with prior GI bleed, chronic diastolic CHF, CKD stage III, pulmonary hypertension, HLD and HTN who is being seen today for the evaluation of atrial fibrillation and chest pressure  at the request of Dr. Nori Riis.   She has not been aware of any irregularity in her heartbeat.LHC 08/2014 showed atretic LIMA to LAD but patent SVG to diagonal and good distal flow. Right heart catheterization October 2016 showed a PA pressure of 77/28 (mean 49 mL Hg), explained at least in part by left heart failure with a mean wedge pressure of 27 mmHg (V wave 41 mmHg).   Last echo 04/2015 showed LVEF of 60-65%, grade 2 DD, moderate MR, moderate dilated LA, PA pressure of 85 mm Hg.   She was hospitalized in August with heart failure related to atrial flutter with rapid ventricular response. Converted to sinus rhythm spontaneously on IV Cardizem.  Last seen by Dr. [name] 07/08/17.   History of Present Illness:   [name] by EMS for chest pressure and palpitation to have A. fib RVR.  Started on IV Cardizem with improved rate.  Patient states chest she is feeling weak and fatigued for past few weeks since seen by Dr. [name].  Progressively worsened.  She felt worse yesterday.  Went to bed yesterday evening around 6 PM.  However woke up with substernal chest pressure radiating to her back.  Associated with severe palpitation.  She took sublingual nitroglycerin x 3 without improvement of her chest pressure.  Called EMS.  Her presenting symptoms different than prior admission in August 2018 for a flutter.  She is still having for  out of 10 chest pressure.  Rate improved to 80s.  Compliant with Eliquis.  Denies orthopnea, PND, syncope, lower extremity edema, melena or blood in her stool or urine.  TSH 5.3.  Potassium 3.4.  Magnesium 1.9.  Troponin negative.  Chest x-ray without acute cardiopulmonary disease.  Past Medical History:  Diagnosis Date  . Arthritis    back  . Atrial fibrillation with rapid ventricular response ([place]) 02/2014   a. CHA2DS2VASc = 6 -> on eliquis;  b. 02/2014 s/p DCCV;  c. 08/2014 Echo: EF 55-60%, Gr 2 DD, mild MR, triv AI.  Marland Kitchen CAD (coronary artery disease)    a. s/p CABG x 2 (LIMA->LAD, VG->Diag);  b. 08/2014 Cath: LM nl, LAD 90p, LCX nl, RCA nl/dominant, LIMA->LAD atretic, VG->D2 patent w/ retrograde filling of LAD, EF 55-60%-->Med Rx.  . Diverticulitis 02/21/2012  . GERD (gastroesophageal reflux disease)   . Hyperlipemia   . Hypertension   . Insomnia     Past Surgical History:  Procedure Laterality Date  . ABDOMINAL HYSTERECTOMY  1975  . CARDIAC CATHETERIZATION N/A 05/09/2015   Procedure: Right Heart Cath;  Surgeon: Larey Dresser, MD;  Location: [facility];  Service: Cardiovascular;  Laterality: N/A;  . CARDIOVERSION N/A 02/28/2014   Procedure: CARDIOVERSION;  Surgeon: Sanda Klein, MD;  Location: [facility];  Service: Cardiovascular;  Laterality: N/A;  . CATARACT EXTRACTION Bilateral 2014   with lens implanted  . COLONOSCOPY N/A 02/19/2015   Procedure: COLONOSCOPY;  Surgeon: Ladene Artist, MD;  Location: [facility];  Service: Endoscopy;  Laterality: N/A;  . CORONARY ARTERY BYPASS GRAFT  2006   off-pump bypass surgery with LIMA to the LAD and SVG to the second diagonal artery ( performed by Dr Servando Snare)   . JOINT REPLACEMENT Bilateral    L-2004, R-2006  . LEFT HEART CATHETERIZATION WITH CORONARY/GRAFT ANGIOGRAM N/A 09/17/2014   Procedure: LEFT HEART CATHETERIZATION WITH Beatrix Fetters;  Surgeon: Troy Sine, MD;  Location: [facility];  Service: Cardiovascular;   Laterality: N/A;  . REIMPLANTATION OF TOTAL KNEE Right 10/08/2014   Procedure: REIMPLANTATION OF RIGHT TOTAL KNEE ARTHROPLASTY WITH REMOVAL OF ANTIBIOTIC SPACER;  Surgeon: Paralee Cancel, MD;  Location: [facility];  Service: Orthopedics;  Laterality: Right;  . ROTATOR CUFF REPAIR Bilateral    r-1999, l- 2005  . TEE WITHOUT CARDIOVERSION N/A 02/28/2014   Procedure: TRANSESOPHAGEAL ECHOCARDIOGRAM (TEE);  Surgeon: Sanda Klein, MD;  Location: [facility];  Service: Cardiovascular;  Laterality: N/A;  . TOTAL KNEE ARTHROPLASTY Right 07/02/2014   Procedure: Resection of Infected Right Total Knee Arthroplasty with placement antibiotic spacer;  Surgeon: Mauri Pole, MD;  Location: [facility];  Service: Orthopedics;  Laterality: Right;      Inpatient Medications: Scheduled Meds: . apixaban  2.5 mg Oral BID  . furosemide  20 mg Oral Daily  . gabapentin  100 mg Oral TID  . isosorbide mononitrate  30 mg Oral Daily  . pantoprazole  40 mg Oral Daily  . rOPINIRole  0.25 mg Oral TID  . simvastatin  40 mg Oral q1800  . traMADol  50 mg Oral Q12H  . traZODone  100 mg Oral QHS   Continuous Infusions: . diltiazem (CARDIZEM) infusion 7.5 mg/hr (07/22/17 0612)   PRN Meds: acetaminophen, ALPRAZolam, nitroGLYCERIN, ondansetron (ZOFRAN) IV  Allergies:    Allergies  Allergen Reactions  . Atorvastatin Other (See Comments)    Myalgias in legs  . Crestor [Rosuvastatin] Other (See Comments)    Myalgias in legs  . Morphine And Related Other (See Comments)    "Crazy thoughts, severe headache, sick"  . Prednisone     On two occasions has caused A fib    Social History:   Social History   Socioeconomic History  . Marital status: Widowed    Spouse name: Not on file  . Number of children: Not on file  . Years of education: Not on file  . Highest education level: Not on file  Social Needs  . Financial resource strain: Not on file  . Food insecurity - worry: Not on file  . Food insecurity - inability: Not  on file  . Transportation needs - medical: Not on file  . Transportation needs - non-medical: Not on file  Occupational History  . Not on file  Tobacco Use  . Smoking status: Never Smoker  . Smokeless tobacco: Never Used  Substance and Sexual Activity  . Alcohol use: No  . Drug use: No  . Sexual activity: No  Other Topics Concern  . 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 "I believe Jesus died on the cross to save me from my sins. I am saved by the grace of God." 05/11/2017   Other: "I have a good life and am happy. My family is there when I need help." 05/11/2017   L. Ducatte, RN, BSN                                                                                                  Family History:    Family History  Problem Relation Age of Onset  . Parkinson's disease Mother   . Heart attack Brother   . Arthritis Brother   . Cancer Brother        Unknown  . Breast cancer Neg Hx      ROS:  Please see the history of present illness.  ROS All other ROS reviewed and negative.     Physical Exam/Data:   Vitals:   07/22/17 0231 07/22/17 0232 07/22/17 0323 07/22/17 0542  BP:   (!) 131/94 114/64  Pulse:    84  Resp: 17 12 17  (!) 22  Temp:    98.1 F (36.7 C)  TempSrc:    Oral  SpO2:    97%  Weight:  127 lb (57.6 kg)    Height:  5\' 1"  (1.549 m)      Intake/Output Summary (Last 24 hours) at 07/22/2017 0852 Last data filed at 07/22/2017 0700 Gross per 24 hour    Intake 76 ml  Output 800 ml  Net -724 ml   Filed Weights   07/22/17 0030 07/22/17 0232  Weight: 127 lb 6.4 oz (57.8 kg) 127 lb (57.6 kg)   Body mass index is 24 kg/m.  General:  Well nourished, well developed, in no acute distress HEENT: normal Lymph: no adenopathy Neck: no JVD Endocrine:  No thryomegaly Vascular: No carotid bruits; FA pulses 2+ bilaterally without bruits  Cardiac:  normal S1, S2; irregularly irregular; no murmur  Lungs:  clear to auscultation bilaterally, no wheezing, rhonchi or rales  Abd: soft, nontender, no hepatomegaly  Ext: no edema Musculoskeletal:  No deformities, BUE and BLE strength normal and equal Skin: warm and dry  Neuro:  CNs 2-12 intact, no focal abnormalities noted Psych:  Normal affect   EKG:  The EKG was personally reviewed and demonstrates: Atrial fibrillation at rate of 127 bpm Telemetry:  Telemetry was personally reviewed and demonstrates: Atrial fibrillation heart rate of 80s with intermittent elevation to 120s  Relevant CV Studies:  LHC 08/2014 ANGIOGRAPHY:  Left main:  Angiographically normal vessel which bifurcated into the LAD and left circumflex coronary artery.  LAD:  Moderate size vessel that gave rise to a proximal first diagonal vessel.  There was a 90% stenosis  Region of the septal perforating artery immediately proximal to the second diagonal vessel and there was evidence for filling of the LAD from this diagonal vessel which was supplied by the vein graft.  Left circumflex:  Moderate size angiographically normal vessel which gave rise to a major marginal branch.   Right coronary  artery:  Angiographically normal dominant vessel which gave rise to the PDA and PLA vessels  LIMA to LAD:  Atretic and did not reach the LAD  SVG to  Diagonal 2 vessel was widely patent. There was retrograde filling of the entire LAD system with brisk TIMI-3.  The diagonal inserted into the LAD immediately distal to the 90% LAD   Stenosis.   Left ventriculography revealed  Reserved global LV contractility with an ejection fraction of 55-60%.  There is very minimal lateral hypocontractility.  Aortic arch aortagraphy identified the takeoff of the great vessels.  The left subclavian was visualized  IMPRESSION:  Normal LV function with very small region of subtle focalmild anterolateral hypocontractility.  Single vessel coronary obstructive disease with 90% stenosis in the LAD Lee proximal to the takeoff of the second diagonal vessel with filling of the LAD  Both antegrade as well as via the vein graft supplying the diagonal 2 vessel.  Normal left circumflex and RCA vessels.  RECOMMENDATION:  Medical therapy.  The patient will resume Eliquis anticoagulation tomorrow.  Laboratory Data:  Chemistry Recent Labs  Lab 07/21/17 1940  NA 140  K 3.4*  CL 107  CO2 25  GLUCOSE 126*  BUN 18  CREATININE 1.45*  CALCIUM 9.7  GFRNONAA 32*  GFRAA 37*  ANIONGAP 8   Hematology Recent Labs  Lab 07/21/17 1940  WBC 3.9*  RBC 3.79*  HGB 11.8*  HCT 35.1*  MCV 92.6  MCH 31.1  MCHC 33.6  RDW 14.4  PLT 170   Cardiac Enzymes Recent Labs  Lab 07/22/17 0110  TROPONINI <0.03    Recent Labs  Lab 07/21/17 1952  TROPIPOC 0.00    Radiology/Studies:  Dg Chest 2 View  Result Date: 07/21/2017 CLINICAL DATA:  83 year old female with chest pain. EXAM: CHEST  2 VIEW COMPARISON:  Chest radiograph dated 03/08/2017 FINDINGS: The lungs are clear. There is no pleural effusion or pneumothorax. The cardiac silhouette is within normal limits. Median sternotomy wires and CABG vascular clips noted. No acute osseous pathology. There is atherosclerotic calcification of the abdominal aorta. IMPRESSION: No active cardiopulmonary disease. Electronically Signed   By: Anner Crete M.D.   On: 07/21/2017 20:14    Assessment and Plan:   1. Atrial fibrillation with rapid ventricular rate -Previously unaware of irregular  heart rate.   She was in sinus rhythm by EKG when seen by Dr. [name] 07/08/17. Symptoms started afterwards.  -Rate improved to 80s on IV Cardizem.  TSH elevated.   -She ate light breakfast.  She will benefit from restoration of sinus rhythm.  No place for cardioversion either today or tomorrow. Start Amiodarone 400mg  BID. Follow thyroid function closely.  - Restart Toprol XL 25mg  qd and wean off IV cardizem. Get Echo.   2.  Chest pressure with history of CAD S/p CABG -Likely rate related.  No improvement after sublingual nitroglycerin x 3.  Troponin negative.  Still having mild chest pressure.  Last coronary angiography February 2016 as above. - Continue Imdur.   3.  Elevated TSH -Per primary team  4.  Chronic diastolic heart failure -Euvolemic.  Continue low-dose daily Lasix.  5.  Hyperlipidemia - Continue statin  For questions or updates, please contact [place] Please consult [email or web address] for contact info under Cardiology/STEMI.   Jarrett Soho, [place]  07/22/2017 8:52 AM   Personally seen and examined. Agree with above.  83 year old with paroxysmal atrial fibrillation, chest pressure, CAD status post CABG, elevated TSH, chronic diastolic heart failure  and hyperlipidemia - We will start amiodarone 400 mg twice a day -We will restart her Toprol 25 daily -Check echocardiogram -Thankfully troponin is negative, continue with isosorbide and provide improvement in her control of atrial fibrillation.  I do not think that we need to pursue angiography at this time.  Exam reveals primarily irregular pulse, alert, no significant edema, abdomen is soft, lungs are clear.  Candee Furbish, MD

## 2017-07-22 NOTE — Progress Notes (Signed)
Family Medicine Teaching Service Daily Progress Note Intern Pager: [phone]  Patient name: Jacqueline [name] Medical record number: [id number] Date of birth: 14-Dec-1930 Age: 83 y.o. Gender: female  Primary Care Provider: [name], MD Consultants: Cardiology Code Status: Full   Pt Overview and Major Events to Date:  12/26: Admit for chest pain and a fib with RVR  Assessment and Plan: Jacqueline [name] is a 83 y.o. female presenting with chest pain . PMH is significant for chronic A-fib on anticoagulation, HTN, CKD stage III, GERD, OA, and CHF  A fib with RVR/ chest pain: Resolved on Dilt drip overnight. NSR now. Troponin neg x 3 -Vitals per floor protocol -Cardiology consulted, appreciate their assistance -Off Dilt drip, resume home metoprolol  - Amiodarone per cards recs -Continue home eliquis for anticoagulation -Continuous cardiac monitoring - Echo  HFpEF: Does not appear fluid overloaded on exam. Her normal weight is ~130 lb. ECHO from 04/2015 prior to heart cath with EF 60-65% and G2DD. PA peak pressure 85 mm Hg.  -Daily weights -Strict I's/O's -Willcontinue home doseoflasix 20 mg daily and imdur 30 gm daily.  Coronary artery disease s/p CABG: Chest pain radiating to jaw and back are new for patient. Pain likely related to her atrial fibrillation with RVR.  -Continue home Imdur and simvastatin  - Cards consulted  HTN: Normotensive -Lasix 20 mg daily  CKD, stage III: Cr is 1.45 which appears to be patient's baseline. Will continue home medications and monitor.  -Monitor on BMP -Continue home Lasix  GERD: Chronic. Denies any heartburn in ED. -Continue home omeprazole  OA: Chronic. In lower back.  -Continue home tramadol 50 mg BID prn  Neuropathy in feet:  - Continue home Gabapentin  Hypokalemia: Resolved  Elevated TSH: TSH 5.316. Free T4 normal. Likely subclinical hypothyroidism.  - Recheck TSH in 6 weeks at PCP office, if it remains elevated can  consider treatment  FEN/GI: heart healthy diet, SLIV Prophylaxis: Eliquis   Disposition: Continue to monitor on inpatient, pending cardiology recommendations  Subjective:  Patient states she no longer has any chest pain or jaw pain. Notes that she is happy her heart is out of a fib. No other complaints.  Objective: Temp:  [97.8 F (36.6 C)-98.7 F (37.1 C)] 98.1 F (36.7 C) (12/27 0542) Pulse Rate:  [71-116] 84 (12/27 0542) Resp:  [12-26] 22 (12/27 0542) BP: (114-156)/(64-100) 114/64 (12/27 0542) SpO2:  [94 %-100 %] 97 % (12/27 0542) Weight:  [127 lb (57.6 kg)-127 lb 6.4 oz (57.8 kg)] 127 lb (57.6 kg) (12/27 0232) Physical Exam: General: NAD, pleasant Cardiovascular: Regular rhythm, regular rate, no m/r/g, no LE edema Respiratory: CTA BL, normal work of breathing Gastrointestinal: soft, nontender, nondistended, normoactive BS MSK: moves 4 extremities equally Psych: AOx3, appropriate affect  Laboratory: Recent Labs  Lab 07/21/17 1940  WBC 3.9*  HGB 11.8*  HCT 35.1*  PLT 170   Recent Labs  Lab 07/21/17 1940  NA 140  K 3.4*  CL 107  CO2 25  BUN 18  CREATININE 1.45*  CALCIUM 9.7  GLUCOSE 126*    Imaging/Diagnostic Tests: Dg Chest 2 View  Result Date: 07/21/2017 CLINICAL DATA:  83 year old female with chest pain. EXAM: CHEST  2 VIEW COMPARISON:  Chest radiograph dated 03/08/2017 FINDINGS: The lungs are clear. There is no pleural effusion or pneumothorax. The cardiac silhouette is within normal limits. Median sternotomy wires and CABG vascular clips noted. No acute osseous pathology. There is atherosclerotic calcification of the abdominal aorta. IMPRESSION: No  active cardiopulmonary disease. Electronically Signed   By: Anner Crete M.D.   On: 07/21/2017 20:14    Jacqueline Dolly, MD 07/22/2017, 9:25 AM PGY-3, [place] Intern pager: [phone], text pages welcome

## 2017-07-22 NOTE — Progress Notes (Signed)
Patient converted to normal sinus rhythm; HR 60. Notified cardiology PA. IV Cardizem currently being titrated down with plans to turn off.

## 2017-07-22 NOTE — Progress Notes (Signed)
Patient admitted from the ed with chest, arm and jaw pain and affib now rate controlled. Patient complains of 8/10 squeezing chest pain .Patient states she  has not had anything for pain at ED. She is alert and oriented times four. Bp 150/100, heart rate is 101 and respiration of 12.

## 2017-07-22 NOTE — Progress Notes (Signed)
  Echocardiogram 2D Echocardiogram has been performed.  Jennette Dubin 07/22/2017, 3:24 PM

## 2017-07-23 LAB — T4, FREE: FREE T4: 0.91 ng/dL (ref 0.61–1.12)

## 2017-07-23 LAB — HEMOGLOBIN A1C
Hgb A1c MFr Bld: 5.8 % — ABNORMAL HIGH (ref 4.8–5.6)
MEAN PLASMA GLUCOSE: 119.76 mg/dL

## 2017-07-23 MED ORDER — AMIODARONE HCL 200 MG PO TABS: ORAL_TABLET | ORAL | 0 refills | Status: DC

## 2017-07-23 NOTE — Discharge Instructions (Signed)
It was a pleasure caring for you during your hospital stay! You were admitted for chest pain due to atrial fibrillation. Your rate was well controlled on diltiazem and you will be sent home with your home medications, in addition to amiodarone. Continue amiodarone 400 mg twice daily for 1 week and then start 200 mg daily. Please continue your eliquis for anticoagulation.  You will need to follow up with your cardiologist and primary care physician.

## 2017-07-23 NOTE — Progress Notes (Signed)
Progress Note  Patient Name: Jacqueline [name] Date of Encounter: 07/23/2017  Primary Cardiologist: Dr. [name]  Subjective   Feels great since converted to sinus rhythm yesterday AM. No chest pain or dyspnea.   Inpatient Medications    Scheduled Meds: . amiodarone  400 mg Oral BID  . apixaban  2.5 mg Oral BID  . furosemide  20 mg Oral Daily  . gabapentin  100 mg Oral TID  . isosorbide mononitrate  30 mg Oral Daily  . metoprolol succinate  25 mg Oral Daily  . pantoprazole  40 mg Oral Daily  . rOPINIRole  0.25 mg Oral TID  . simvastatin  40 mg Oral q1800  . traMADol  50 mg Oral Q12H  . traZODone  100 mg Oral QHS   Continuous Infusions:  PRN Meds: acetaminophen, ALPRAZolam, nitroGLYCERIN, ondansetron (ZOFRAN) IV   Vital Signs    Vitals:   07/22/17 0542 07/22/17 1508 07/22/17 2000 07/23/17 0500  BP: 114/64 108/71 (!) 147/60 (!) 145/68  Pulse: 84 69 63 63  Resp: (!) 22 (!) 21 14 16   Temp: 98.1 F (36.7 C) 98.8 F (37.1 C) 98.6 F (37 C) 98.4 F (36.9 C)  TempSrc: Oral Oral Oral Oral  SpO2: 97% 95% 95% 94%  Weight:    128 lb 9.6 oz (58.3 kg)  Height:        Intake/Output Summary (Last 24 hours) at 07/23/2017 0826 Last data filed at 07/22/2017 1745 Gross per 24 hour  Intake 1106.38 ml  Output -  Net 1106.38 ml   Filed Weights   07/22/17 0030 07/22/17 0232 07/23/17 0500  Weight: 127 lb 6.4 oz (57.8 kg) 127 lb (57.6 kg) 128 lb 9.6 oz (58.3 kg)    Telemetry    Maintaining sinus rhythm since 9:45am 12/27- Personally Reviewed  ECG    N/A  Physical Exam   GEN: No acute distress.   Neck: No JVD Cardiac: RRR, no murmurs, rubs, or gallops.  Respiratory: Clear to auscultation bilaterally. GI: Soft, nontender, non-distended  MS: No edema; No deformity. Neuro:  Nonfocal  Psych: Normal affect   Labs    Chemistry Recent Labs  Lab 07/21/17 1940 07/22/17 0807  NA 140 141  K 3.4* 4.1  CL 107 105  CO2 25 23  GLUCOSE 126* 89  BUN 18 16  CREATININE  1.45* 1.23*  CALCIUM 9.7 9.6  GFRNONAA 32* 39*  GFRAA 37* 45*  ANIONGAP 8 13     Hematology Recent Labs  Lab 07/21/17 1940 07/22/17 0807  WBC 3.9* 4.8  RBC 3.79* 4.00  HGB 11.8* 12.5  HCT 35.1* 37.0  MCV 92.6 92.5  MCH 31.1 31.3  MCHC 33.6 33.8  RDW 14.4 14.1  PLT 170 171    Cardiac Enzymes Recent Labs  Lab 07/22/17 0110 07/22/17 0807 07/22/17 1227  TROPONINI <0.03 <0.03 <0.03    Recent Labs  Lab 07/21/17 1952  TROPIPOC 0.00     Radiology    Dg Chest 2 View  Result Date: 07/21/2017 CLINICAL DATA:  83 year old female with chest pain. EXAM: CHEST  2 VIEW COMPARISON:  Chest radiograph dated 03/08/2017 FINDINGS: The lungs are clear. There is no pleural effusion or pneumothorax. The cardiac silhouette is within normal limits. Median sternotomy wires and CABG vascular clips noted. No acute osseous pathology. There is atherosclerotic calcification of the abdominal aorta. IMPRESSION: No active cardiopulmonary disease. Electronically Signed   By: Jacqueline [name] M.D.   On: 07/21/2017 20:14    Cardiac Studies  Echo 07/22/17 Study Conclusions  - Left ventricle: The cavity size was normal. There was mild   concentric hypertrophy. Systolic function was normal. The   estimated ejection fraction was in the range of 55% to 60%. Wall   motion was normal; there were no regional wall motion   abnormalities. Left ventricular diastolic function parameters   were normal. - Aortic valve: Trileaflet; mildly thickened leaflets. There was   trivial regurgitation. - Mitral valve: There was trivial regurgitation. - Tricuspid valve: There was mild regurgitation.  Patient Profile     83 y.o. female with a hx of CAD s/p CABG, paroxysmal atrial fibrillation/flutter requring cardioversion in 2015, on Eliquis for anticoagulation with prior GI bleed, chronic diastolic CHF, CKD stage III, pulmonary hypertension, HLD and HTN presented for fatigue, weakness and chest pressure and found to  have atrial fibrillation with RVR. She was unaware of palpitation.   Assessment & Plan     1. Paroxysmal atrial fibrillation - She is unaware of irregular heart rate.  Converted to sinus rhythm yesterday morning even before starting Amiodarone. Continue BB. Continue loading dose of amiodarone to maintain sinus rhythm. Eliquis for anticoagulation. Echo showed normal LVEF.   2. Chest pain pressure - Likely rate related. No improvement after sublingual nitroglycerin x 3.  Troponin negative. Symptoms resolved with restoration of sinus rhythm. Continue Imdur.   3.Chronic diastolic CHF - Maintaining sinus rhythm  4. HLD - Continue  Discharge later today once seen by MD.   For questions or updates, please contact [place] Please consult [email or web address] for contact info under Cardiology/STEMI.      Jacqueline [name] [place], PA  07/23/2017, 8:26 AM    Patient examined chart reviewed. Post [facility] Feels well NSR rates 65-80 with some sinus arrhythmia. Exam with no murmur clear lungs no edema. D/c home amiodarone 400 mg bid  for a week then d/c to 200 mg daily continue eliquis for anticoagulation  Jacqueline [name]

## 2017-07-23 NOTE — Progress Notes (Signed)
Family Medicine Teaching Service Daily Progress Note Intern Pager: [phone]  Patient name: Jacqueline [name] Medical record number: [id number] Date of birth: 13-Oct-1930 Age: 83 y.o. Gender: female  Primary Care Provider: [name], MD Consultants: Cardiology Code Status: Full   Pt Overview and Major Events to Date:  12/26: Admit for chest pain and a fib with RVR  Assessment and Plan: Jacqueline [name] is a 83 y.o. female presenting with chest pain . PMH is significant for chronic A-fib on anticoagulation, HTN, CKD stage III, GERD, OA, and CHF  A fib with RVR/ chest pain: Resolved. NSR now. Troponin neg x 3 -Cardiology consulted, appreciate their assistance -Amiodarone per cards recs, metoprolol continued -Continue home eliquis for anticoagulation -Continuous cardiac monitoring  HFpEF: Does not appear fluid overloaded on exam. Her normal weight is ~130 lb. Echo 07/22/2017 shows improvement from previous with mild concentric hypertrophy. EF of 55%-60%. Trileaflet aortic vale with thickened leaflets and mild AR. Trivial MR. Mild TR. -Daily weights -Strict I's/O's -Willcontinue home doseoflasix 20 mg daily and imdur 30 gm daily.  Coronary artery disease s/p CABG: Chest pain radiating to jaw and back are new for patient. Pain likely related to her atrial fibrillation with RVR.  -Continue home Imdur and simvastatin  -Cards consulted  HTN: Normotensive -Lasix 20 mg daily  CKD, stage III: Cr is 1.45 which appears to be patient's baseline. Will continue home medications and monitor.  -Monitor on BMP -Continue home Lasix  GERD: Chronic. Denies any heartburn in ED. -Continue home omeprazole  OA: Chronic. In lower back.  -Continue home tramadol 50 mg BID prn  Neuropathy in feet:  - Continue home Gabapentin  Hypokalemia: Resolved  Elevated TSH: TSH 5.316. Free T4 normal. Likely subclinical hypothyroidism.  -Recheck TSH in 6 weeks at PCP office, if it remains elevated  can consider treatment  FEN/GI: heart healthy diet, SLIV Prophylaxis: Eliquis  Disposition: home  Subjective:  Ready to go today. No complaints.   Objective: Temp:  [98.1 F (36.7 C)-98.8 F (37.1 C)] 98.6 F (37 C) (12/27 2000) Pulse Rate:  [63-84] 63 (12/27 2000) Resp:  [12-22] 14 (12/27 2000) BP: (108-147)/(60-94) 147/60 (12/27 2000) SpO2:  [94 %-97 %] 95 % (12/27 2000) Weight:  [127 lb (57.6 kg)] 127 lb (57.6 kg) (12/27 0232) Physical Exam: General: NAD, pleasant Cardiovascular: Regular rhythm, regular rate, no m/r/g, no LE edema Respiratory: CTA BL, normal work of breathing Gastrointestinal: soft, nontender, nondistended, normoactive BS MSK: moves 4 extremities equally Psych: AOx3, appropriate affect  Laboratory: Recent Labs  Lab 07/21/17 1940 07/22/17 0807  WBC 3.9* 4.8  HGB 11.8* 12.5  HCT 35.1* 37.0  PLT 170 171   Recent Labs  Lab 07/21/17 1940 07/22/17 0807  NA 140 141  K 3.4* 4.1  CL 107 105  CO2 25 23  BUN 18 16  CREATININE 1.45* 1.23*  CALCIUM 9.7 9.6  GLUCOSE 126* 89   ECHO 07/22/2017: Study Conclusions  - Left ventricle: The cavity size was normal. There was mild   concentric hypertrophy. Systolic function was normal. The   estimated ejection fraction was in the range of 55% to 60%. Wall   motion was normal; there were no regional wall motion   abnormalities. Left ventricular diastolic function parameters   were normal. - Aortic valve: Trileaflet; mildly thickened leaflets. There was   trivial regurgitation. - Mitral valve: There was trivial regurgitation. - Tricuspid valve: There was mild regurgitation.  Imaging/Diagnostic Tests: No results found.  Shirley, [place], DO  07/23/2017, 1:24 AM PGY-1, [place] Intern pager: [phone], text pages welcome

## 2017-07-23 NOTE — Care Management Note (Signed)
Case Management Note  Patient Details  Name: Jacqueline [name] MRN: [id number] Date of Birth: 03-10-1931  Subjective/Objective:                 Patient with order to DC to home today. Chart reviewed. No Home Health or Equipment needs, no unacknowledged Case Management consults or medication needs identified at the time of this note. Plan for DC to home.   Action/Plan:   Expected Discharge Date:  07/23/17               Expected Discharge Plan:  Home/Self Care  In-House Referral:     Discharge planning Services  CM Consult  Post Acute Care Choice:    Choice offered to:     DME Arranged:    DME Agency:     HH Arranged:    HH Agency:     Status of Service:  Completed, signed off  If discussed at [employer] of Stay Meetings, dates discussed:    Additional Comments:  Carles Collet, RN 07/23/2017, 11:54 AM

## 2017-07-23 NOTE — Discharge Summary (Signed)
[facility] Discharge Summary  Patient name: Jacqueline [name] Medical record number: [id number] Date of birth: 1930-10-02 Age: 83 y.o. Gender: female Date of Admission: 07/21/2017  Date of Discharge: 07/23/17  Admitting Physician: Dickie La, MD  Primary Care Provider: [name], MD Consultants: Cardiology  Indication for Hospitalization: chest pain  Discharge Diagnoses/Problem List:  Atrial fibrillation with RVR CHF CAD s/p CABG HTN CKD, stage III GERD OA Neuropathy in feet Hypokalemia Insomnia  Disposition: Home  Discharge Condition: Stable  Discharge Exam: See progress note from day of discharge  Brief Hospital Course:  This 83 year old female presented with chest pain radiating to jaw and back.  On admission to ED patient was found to be in A. fib with RVR.  A diltiazem drip was started.  Patient converted back to regular rhythm on 12/27 not requiring any cardioversion.  Troponins were trended given patient's chest pain radiating to jaw and back, all were negative.  EKGs just showed atrial fibrillation.  Chest pain had resolved upon return to normal sinus rhythm.  Cardiology saw patient on 12/27 and started patient on amiodarone 400 mg twice daily for 1 week.  Patient had a repeat echo performed on 12/27 which showed significant improvement from previous echo in 2016.  Issues for Follow Up:  1. Patient has follow-up scheduled with cardiology.  Ensure that she goes to this appointment.  She is to take 400 mg twice daily of amiodarone for 1 week ending on 07/29/2017 and then resume taking 200 mg daily. 2. Patient has been taking diphenhydramine nightly in order to help with sleep.  Discontinued upon discharge and encouraged patient to take only trazodone.  May consider adding melatonin. 3. TSH elevated to 5.316 with free T4 of 0.94.  Follow-up repeat TSH in 4-6 weeks.  Significant Procedures:   Significant Labs and Imaging:  Recent Labs   Lab 07/21/17 1940 07/22/17 0807  WBC 3.9* 4.8  HGB 11.8* 12.5  HCT 35.1* 37.0  PLT 170 171   Recent Labs  Lab 07/21/17 1940 07/22/17 0110 07/22/17 0807  NA 140  --  141  K 3.4*  --  4.1  CL 107  --  105  CO2 25  --  23  GLUCOSE 126*  --  89  BUN 18  --  16  CREATININE 1.45*  --  1.23*  CALCIUM 9.7  --  9.6  MG  --  1.9  --    Troponin 0.00, <0.03, <0.03, <0.03 TSH 5.316 Free T4 0.94 Hgb A1c 5.8  ECHO 07/22/2017: Study Conclusions  - Left ventricle: The cavity size was normal. There was mild concentric hypertrophy. Systolic function was normal. The estimated ejection fraction was in the range of 55% to 60%. Wall motion was normal; there were no regional wall motion abnormalities. Left ventricular diastolic function parameters were normal. - Aortic valve: Trileaflet; mildly thickened leaflets. There was trivial regurgitation. - Mitral valve: There was trivial regurgitation. - Tricuspid valve: There was mild regurgitation.  Results/Tests Pending at Time of Discharge: None  Discharge Medications:  Allergies as of 07/23/2017      Reactions   Atorvastatin Other (See Comments)   Myalgias in legs   Crestor [rosuvastatin] Other (See Comments)   Myalgias in legs   Morphine And Related Other (See Comments)   "Crazy thoughts, severe headache, sick"   Prednisone    On two occasions has caused A fib      Medication List    STOP taking  these medications   diphenhydrAMINE 25 MG tablet Commonly known as:  BENADRYL   diphenhydramine-acetaminophen 25-500 MG Tabs tablet Commonly known as:  TYLENOL PM     TAKE these medications   acetaminophen 500 MG tablet Commonly known as:  TYLENOL Take 500-1,000 mg by mouth every 6 (six) hours as needed for moderate pain or headache.   ALPRAZolam 0.5 MG tablet Commonly known as:  XANAX TAKE 1 TABLET BY MOUTH TWICE A DAY AS NEEDED FOR ANXIETY What changed:  See the new instructions.   amiodarone 200 MG  tablet Commonly known as:  PACERONE Take 400 mg (2 tabs) twice daily for 1 week, ending 07/29/2017. Then start taking 200 mg (1 tab) once daily.   apixaban 2.5 MG Tabs tablet Commonly known as:  ELIQUIS Take 1 tablet (2.5 mg total) 2 (two) times daily by mouth.   Fish Oil 1000 MG Caps Take 1-2 capsules by mouth See admin instructions. Takes one in the morning and two capsules at night.   furosemide 20 MG tablet Commonly known as:  LASIX TAKE 1 TABLET BY MOUTH EVERY DAY   gabapentin 100 MG capsule Commonly known as:  NEURONTIN TAKE 1 CAPSULE (100 MG TOTAL) BY MOUTH 3 (THREE) TIMES DAILY.   GERITOL COMPLETE PO Take 1 tablet by mouth daily.   isosorbide mononitrate 30 MG 24 hr tablet Commonly known as:  IMDUR TAKE 1 TABLET BY MOUTH EVERY DAY   metoprolol succinate 25 MG 24 hr tablet Commonly known as:  TOPROL-XL Take 1 tablet (25 mg total) by mouth daily.   nitroGLYCERIN 0.4 MG SL tablet Commonly known as:  NITROSTAT PLACE 1 TABLET (0.4 MG TOTAL) UNDER THE TONGUE EVERY 5 (FIVE) MINUTES AS NEEDED FOR CHEST PAIN. X 3 doses   omeprazole 20 MG capsule Commonly known as:  PRILOSEC TAKE ONE CAPSULE BY MOUTH EVERY DAY   polyethylene glycol powder powder Commonly known as:  GLYCOLAX/MIRALAX MIX 17 G AND TAKE BY MOUTH DAILY. What changed:  See the new instructions.   rOPINIRole 0.25 MG tablet Commonly known as:  REQUIP TAKE 1 TABLET BY MOUTH 3 TIMES A DAY What changed:    how much to take  how to take this  when to take this   simvastatin 40 MG tablet Commonly known as:  ZOCOR TAKE 1/2 A TABLET BY MOUTH AT BEDTIME. What changed:  See the new instructions.   traMADol 50 MG tablet Commonly known as:  ULTRAM TAKE 1 TABLET BY MOUTH TWICE A DAY AS NEEDED FOR PAIN   traZODone 100 MG tablet Commonly known as:  DESYREL TAKE 1 TABLET (100 MG TOTAL) BY MOUTH AT BEDTIME.       Discharge Instructions: Please refer to Patient Instructions section of EMR for full details.   Patient was counseled important signs and symptoms that should prompt return to medical care, changes in medications, dietary instructions, activity restrictions, and follow up appointments.   Follow-Up Appointments: Follow-up Information    Erlene Quan, PA-C. Go on 08/18/2017.   Specialties:  Cardiology, Radiology Why:  @3pm  for post hospital cardiology follow up Contact information: [place] [address] [phone]        [name], MD. Go on 07/29/2017.   Specialty:  Family Medicine Why:  Your appointment is scheduled at 9:30am. Please arrive 15 mintues early.  Contact information: [place] [address] [phone]           Shyana Kulakowski, [place], DO 07/23/2017,  11:31 AM PGY-1, [place]

## 2017-07-29 ENCOUNTER — Other Ambulatory Visit: Payer: Self-pay

## 2017-07-29 ENCOUNTER — Ambulatory Visit (INDEPENDENT_AMBULATORY_CARE_PROVIDER_SITE_OTHER): Payer: PPO | Admitting: Family Medicine

## 2017-07-29 ENCOUNTER — Encounter: Payer: Self-pay | Admitting: Family Medicine

## 2017-07-29 DIAGNOSIS — I5032 Chronic diastolic (congestive) heart failure: Secondary | ICD-10-CM

## 2017-07-29 DIAGNOSIS — I480 Paroxysmal atrial fibrillation: Secondary | ICD-10-CM

## 2017-07-29 DIAGNOSIS — I25118 Atherosclerotic heart disease of native coronary artery with other forms of angina pectoris: Secondary | ICD-10-CM

## 2017-07-29 DIAGNOSIS — I495 Sick sinus syndrome: Secondary | ICD-10-CM

## 2017-07-29 NOTE — Patient Instructions (Signed)
I am sorry that you spent the holidays in the hospital. Things seem fine right now.  Keep an eye on the stomach discomfort and the lightheadedness.  If they get worse, I may need to make some changes. If things are going well, see me in March after the cold and flu season.  I will need to check thyroid blood work next visit.

## 2017-07-30 ENCOUNTER — Encounter: Payer: Self-pay | Admitting: Family Medicine

## 2017-07-30 NOTE — Progress Notes (Signed)
   Subjective:    Patient ID: Jacqueline [name], female    DOB: 1931-07-14, 84 y.o.   MRN: [id number]  HPI Patient hospitalized with a fib, RVR and jaw, neck and chest pain.  Labs OK.  Did not bump trops.  Cards started on amiondorone.  She feels much better.  Maybe the amiodorone is causing some stomach upset.  Fortunately, she is due to decrease amio to once daily today.  Also complains of mild lightheadedness on standing.  She knows to be careful.  No chest, neck or jaw pain since she converted spontaneously in the hospital.     Review of Systems     Objective:   Physical Exam VS noted.  BP good but mild brady. Lungs clear, Cardiac RRR without m or g Ext no edema.        Assessment & Plan:

## 2017-07-30 NOTE — Assessment & Plan Note (Signed)
Seems comfortable and at dry weight.

## 2017-07-30 NOTE — Assessment & Plan Note (Signed)
No chest pain since discharge.  Stable.  No changes.

## 2017-07-30 NOTE — Assessment & Plan Note (Signed)
I do not think this is simple a fib.  She has danced with bradycardia for a while.  No changes now.  Watch for brady.  May need holter if lightheadedness continues.

## 2017-07-30 NOTE — Assessment & Plan Note (Signed)
Now in sinus.  On amio per cards.

## 2017-08-02 ENCOUNTER — Telehealth: Payer: Self-pay | Admitting: Cardiovascular Disease

## 2017-08-02 NOTE — Telephone Encounter (Signed)
Thanks, agree. MCr

## 2017-08-02 NOTE — Telephone Encounter (Signed)
Returned the call to the patient. She stated that her blood pressures has been sporadic, from high to low. She stated that this morning it was 172/89 and heart rate of 57. This was before her medication. She was asked to recheck her blood pressure while on the phone but she was unable to do this. She stated that she requires help and her son was not there. Her son is currently in the process of buying her a new one where she can check her blood pressure by herself.  Her blood pressure yesterday after her medication was 116/61 and heart rate 60. On the 6th it was 90/54 and heart rate was 56 (post medication). She stated that she only gets symptomatic when her blood pressure drops that low. She currently takes Amiodarone 200 mg and Metoprolol 25 mg in the morning. She has a follow up appointment on 1/23 with Kerin Ransom, [place]. She has been advised to check her blood pressure a few hours after her medication and keep a log of this. Message routed to the provider for his knowledge.

## 2017-08-02 NOTE — Telephone Encounter (Signed)
New message  Please call, patient has concerns about her BP changes  Pt c/o BP issue: STAT if pt c/o blurred vision, one-sided weakness or slurred speech  1. What are your last 5 BP readings? 172/89, 144/83 2. Are you having any other symptoms (ex. Dizziness, headache, blurred vision, passed out)? Dizziness and lightheaded  3. What is your BP issue? Concerns about  BP dropping low

## 2017-08-17 ENCOUNTER — Other Ambulatory Visit: Payer: Self-pay | Admitting: Family Medicine

## 2017-08-17 MED ORDER — SIMVASTATIN 20 MG PO TABS: 20.0000 mg | ORAL_TABLET | Freq: Every day | ORAL | 3 refills | Status: DC

## 2017-08-18 ENCOUNTER — Ambulatory Visit: Payer: PPO | Admitting: Cardiology

## 2017-08-18 ENCOUNTER — Encounter: Payer: Self-pay | Admitting: Cardiology

## 2017-08-18 VITALS — BP 134/70 | HR 54 | Ht 61.0 in | Wt 133.8 lb

## 2017-08-18 DIAGNOSIS — Z7901 Long term (current) use of anticoagulants: Secondary | ICD-10-CM

## 2017-08-18 DIAGNOSIS — I480 Paroxysmal atrial fibrillation: Secondary | ICD-10-CM | POA: Diagnosis not present

## 2017-08-18 DIAGNOSIS — I5032 Chronic diastolic (congestive) heart failure: Secondary | ICD-10-CM | POA: Diagnosis not present

## 2017-08-18 DIAGNOSIS — Z951 Presence of aortocoronary bypass graft: Secondary | ICD-10-CM

## 2017-08-18 DIAGNOSIS — N183 Chronic kidney disease, stage 3 unspecified: Secondary | ICD-10-CM

## 2017-08-18 DIAGNOSIS — I43 Cardiomyopathy in diseases classified elsewhere: Secondary | ICD-10-CM | POA: Diagnosis not present

## 2017-08-18 DIAGNOSIS — R079 Chest pain, unspecified: Secondary | ICD-10-CM | POA: Diagnosis not present

## 2017-08-18 DIAGNOSIS — I2721 Secondary pulmonary arterial hypertension: Secondary | ICD-10-CM

## 2017-08-18 DIAGNOSIS — E785 Hyperlipidemia, unspecified: Secondary | ICD-10-CM

## 2017-08-18 DIAGNOSIS — I119 Hypertensive heart disease without heart failure: Secondary | ICD-10-CM | POA: Diagnosis not present

## 2017-08-18 MED ORDER — AMLODIPINE BESYLATE 5 MG PO TABS: 5.0000 mg | ORAL_TABLET | Freq: Every day | ORAL | 3 refills | Status: DC

## 2017-08-18 NOTE — Assessment & Plan Note (Signed)
She continues to have SSCP worrisome for angina. She is on Imdur.

## 2017-08-18 NOTE — Assessment & Plan Note (Signed)
Normal LVF with grade 2 DD 

## 2017-08-18 NOTE — Assessment & Plan Note (Signed)
B/P 152/68 by me- both arms

## 2017-08-18 NOTE — Assessment & Plan Note (Signed)
CABG x 2 2006- Cath in 2016 showed occluded LIMA to LAD with a patent SVG-Dx that filled the LAD

## 2017-08-18 NOTE — Patient Instructions (Addendum)
Medication Instructions:  START Norvasc 5mg  Take 1 tablet once a day  Labwork: None   Testing/Procedures: Your physician has requested that you have a lexiscan myoview. For further information please visit [email or web address]. Please follow instruction sheet, as given.  Follow-Up: Your physician recommends that you schedule a follow-up appointment in: 2-3 weeks with Kerin Ransom, PA-C  Any Other Special Instructions Will Be Listed Below (If Applicable). Eliquis 2.5mg  Samples given  If you need a refill on your cardiac medications before your next appointment, please call your pharmacy.

## 2017-08-18 NOTE — Assessment & Plan Note (Signed)
2016 TTE: Systolic pressure was severely increased. PA  peak pressure: 85 mm Hg  

## 2017-08-18 NOTE — Assessment & Plan Note (Signed)
Recurrent PAF Dec 2018-placed on Amiodarone

## 2017-08-18 NOTE — Progress Notes (Signed)
08/18/2017 Jacqueline [name]   1930/10/24  [id number]  Primary Physician Andria Frames Jamal Collin, MD Primary Cardiologist: Dr [name]  HPI:  84 y.o. female with a hx of CAD s/p CABG x 2 in 2006. A Cath done in 2016 showed an occluded LIMA-LAD with filling of the LADS via the SVG-OM. She has a history of paroxysmal atrial fibrillation/flutter requring cardioversion in 2015. She is on Eliquis for anticoagulation. Other problems include a history of prior GI bleed, chronic diastolic CHF, CKD stage III, pulmonary hypertension, HLD and HTN. She was recently admitted with chest pain and tachycardia and was found to be in AF. She was admitted and placed on IV Diltiazem for rate control. The plan was to start Amiodarone and she actually converted before this was started. She is in the office today for follow up. She continues to have intermittent vague chest "pressure". Its not always exertional and she doesn't take anything for it. She denies having any palpitations. It does not radiate to her arms or jaw.    Current Outpatient Medications  Medication Sig Dispense Refill  . acetaminophen (TYLENOL) 500 MG tablet Take 500-1,000 mg by mouth every 6 (six) hours as needed for moderate pain or headache.    . ALPRAZolam (XANAX) 0.5 MG tablet TAKE 1 TABLET BY MOUTH TWICE A DAY AS NEEDED FOR ANXIETY (Patient taking differently: TAKE 0.5 MG BY MOUTH AT BEDTIME AND 0.25 MG BY MOUTH DAILY  AS NEEDED FOR ANXIETY) 60 tablet 5  . amiodarone (PACERONE) 200 MG tablet Take 1 tablet (200 mg total) by mouth daily. 90 tablet 3  . apixaban (ELIQUIS) 2.5 MG TABS tablet Take 1 tablet (2.5 mg total) 2 (two) times daily by mouth. 28 tablet 0  . furosemide (LASIX) 20 MG tablet TAKE 1 TABLET BY MOUTH EVERY DAY 90 tablet 3  . gabapentin (NEURONTIN) 100 MG capsule TAKE 1 CAPSULE (100 MG TOTAL) BY MOUTH 3 (THREE) TIMES DAILY. 270 capsule 3  . Iron-Vitamins (GERITOL COMPLETE PO) Take 1 tablet by mouth daily.    . isosorbide mononitrate  (IMDUR) 30 MG 24 hr tablet TAKE 1 TABLET BY MOUTH EVERY DAY 90 tablet 3  . metoprolol succinate (TOPROL-XL) 25 MG 24 hr tablet Take 1 tablet (25 mg total) by mouth daily. 90 tablet 3  . nitroGLYCERIN (NITROSTAT) 0.4 MG SL tablet PLACE 1 TABLET (0.4 MG TOTAL) UNDER THE TONGUE EVERY 5 (FIVE) MINUTES AS NEEDED FOR CHEST PAIN. X 3 doses 25 tablet 1  . Omega-3 Fatty Acids (FISH OIL) 1000 MG CAPS Take 1-2 capsules by mouth See admin instructions. Takes one in the morning and two capsules at night.    Marland Kitchen omeprazole (PRILOSEC) 20 MG capsule TAKE ONE CAPSULE BY MOUTH EVERY DAY 90 capsule 3  . polyethylene glycol powder (GLYCOLAX/MIRALAX) powder MIX 17 G AND TAKE BY MOUTH DAILY. (Patient taking differently: MIX 17 G AND TAKE BY MOUTH DAILY PRN) 527 g 12  . rOPINIRole (REQUIP) 0.25 MG tablet TAKE 1 TABLET BY MOUTH 3 TIMES A DAY (Patient taking differently: TAKE 0.25MG  BY MOUTH ONCE DAILY) 270 tablet 3  . simvastatin (ZOCOR) 20 MG tablet Take 1 tablet (20 mg total) by mouth at bedtime. 90 tablet 3  . traMADol (ULTRAM) 50 MG tablet TAKE 1 TABLET BY MOUTH TWICE A DAY AS NEEDED FOR PAIN 60 tablet 4  . traZODone (DESYREL) 100 MG tablet TAKE 1 TABLET (100 MG TOTAL) BY MOUTH AT BEDTIME. 90 tablet 3  . amLODipine (NORVASC) 5 MG tablet  Take 1 tablet (5 mg total) by mouth daily. 180 tablet 3   No current facility-administered medications for this visit.     Allergies  Allergen Reactions  . Atorvastatin Other (See Comments)    Myalgias in legs  . Crestor [Rosuvastatin] Other (See Comments)    Myalgias in legs  . Morphine And Related Other (See Comments)    "Crazy thoughts, severe headache, sick"  . Prednisone     On two occasions has caused A fib    Past Medical History:  Diagnosis Date  . Arthritis    back  . Atrial fibrillation with rapid ventricular response ([place]) 02/2014   a. CHA2DS2VASc = 6 -> on eliquis;  b. 02/2014 s/p DCCV;  c. 08/2014 Echo: EF 55-60%, Gr 2 DD, mild MR, triv AI.  Marland Kitchen CAD (coronary  artery disease)    a. s/p CABG x 2 (LIMA->LAD, VG->Diag);  b. 08/2014 Cath: LM nl, LAD 90p, LCX nl, RCA nl/dominant, LIMA->LAD atretic, VG->D2 patent w/ retrograde filling of LAD, EF 55-60%-->Med Rx.  . Diverticulitis 02/21/2012  . GERD (gastroesophageal reflux disease)   . Hyperlipemia   . Hypertension   . Insomnia     Social History   Socioeconomic History  . Marital status: Widowed    Spouse name: Not on file  . Number of children: Not on file  . Years of education: Not on file  . Highest education level: Not on file  Social Needs  . Financial resource strain: Not on file  . Food insecurity - worry: Not on file  . Food insecurity - inability: Not on file  . Transportation needs - medical: Not on file  . Transportation needs - non-medical: Not on file  Occupational History  . Not on file  Tobacco Use  . Smoking status: Never Smoker  . Smokeless tobacco: Never Used  Substance and Sexual Activity  . Alcohol use: No  . Drug use: No  . Sexual activity: No  Other Topics Concern  . 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 "I believe Jesus died on the cross to save me [id number] my sins. I am saved by the grace of God." 05/11/2017   Other: "I have a good life and am happy. My family is there when I need help." 05/11/2017   L. Ducatte, RN, BSN                                                                                                   Family History  Problem Relation  Age of Onset  . Parkinson's disease Mother   .  Heart attack Brother   . Arthritis Brother   . Cancer Brother        Unknown  . Breast cancer Neg Hx      Review of Systems: General: negative for chills, fever, night sweats or weight changes.  Cardiovascular: negative for chest pain, dyspnea on exertion, edema, orthopnea, palpitations, paroxysmal nocturnal dyspnea or shortness of breath Dermatological: negative for rash Respiratory: negative for cough or wheezing Urologic: negative for hematuria Abdominal: negative for nausea, vomiting, diarrhea, bright red blood per rectum, melena, or hematemesis Neurologic: negative for visual changes, syncope, or dizziness All other systems reviewed and are otherwise negative except as noted above.    Blood pressure 134/70, pulse (!) 54, height 5\' 1"  (1.549 m), weight 133 lb 12.8 oz (60.7 kg).  General appearance: alert, cooperative and no distress Neck: no carotid bruit and no JVD Lungs: clear to auscultation bilaterally Heart: regular rate and rhythm Extremities: extremities normal, atraumatic, no cyanosis or edema Skin: Skin color, texture, turgor normal. No rashes or lesions Neurologic: Grossly normal  EKG NSR, HR 54, no acute changes  ASSESSMENT AND PLAN:   Hypertension, uncontrolled B/P 152/68 by me- both arms  Chest pain with moderate risk of acute coronary syndrome She continues to have SSCP worrisome for angina. She is on Imdur.   Chronic diastolic CHF (congestive heart failure) (HCC) Has both diastolic dysfunction and right heart failure. Dry weight is 127-130  CKD (chronic kidney disease) stage 3, GFR 30-59 ml/min (HCC) Last SCr 1.27-GFR 40  Hx of CABG CABG x 2 2006- Cath in 2016 showed occluded LIMA to LAD with a patent SVG-Dx that filled the LAD  PAF (paroxysmal atrial fibrillation) ([place]) Recurrent PAF Dec 2018-placed on Amiodarone  Pulmonary arterial hypertension ([place]) 4356 TTE: Systolic pressure was severely  increased. PA  peak pressure: 85 mm Hg   Long term current use of anticoagulant CHADS VASC=6- on Eliquis  Hypertensive cardiomyopathy, without heart failure (HCC) Normal LVF with grade 2 DD    PLAN  Add Norvasc 5 mg for HTN and possible angina. Check Lexiscan, I'll see her back in 2 weeks to review her scan and f/u her B/P.   Kerin Ransom PA-C 08/18/2017 3:21 PM

## 2017-08-18 NOTE — Assessment & Plan Note (Signed)
Last SCr 1.27-GFR 40

## 2017-08-18 NOTE — Assessment & Plan Note (Signed)
CHADS VASC=6- on Eliquis

## 2017-08-18 NOTE — Assessment & Plan Note (Signed)
Has both diastolic dysfunction and right heart failure. Dry weight is 127-130 

## 2017-08-19 ENCOUNTER — Telehealth (HOSPITAL_COMMUNITY): Payer: Self-pay

## 2017-08-19 NOTE — Telephone Encounter (Signed)
Encounter complete. 

## 2017-08-24 ENCOUNTER — Ambulatory Visit (HOSPITAL_COMMUNITY): Admission: RE | Admit: 2017-08-24 | Payer: PPO | Source: Ambulatory Visit

## 2017-08-25 ENCOUNTER — Telehealth (HOSPITAL_COMMUNITY): Payer: Self-pay

## 2017-08-25 NOTE — Telephone Encounter (Signed)
Encounter complete. 

## 2017-08-31 ENCOUNTER — Ambulatory Visit (HOSPITAL_COMMUNITY)
Admission: RE | Admit: 2017-08-31 | Discharge: 2017-08-31 | Disposition: A | Discharge status: Home / Self Care | Payer: PPO | Source: Ambulatory Visit | Attending: Cardiology | Admitting: Cardiology

## 2017-08-31 DIAGNOSIS — R079 Chest pain, unspecified: Secondary | ICD-10-CM | POA: Diagnosis not present

## 2017-08-31 DIAGNOSIS — R0602 Shortness of breath: Secondary | ICD-10-CM | POA: Insufficient documentation

## 2017-08-31 DIAGNOSIS — Z8249 Family history of ischemic heart disease and other diseases of the circulatory system: Secondary | ICD-10-CM | POA: Insufficient documentation

## 2017-08-31 DIAGNOSIS — Z7901 Long term (current) use of anticoagulants: Secondary | ICD-10-CM

## 2017-08-31 DIAGNOSIS — Z951 Presence of aortocoronary bypass graft: Secondary | ICD-10-CM | POA: Diagnosis not present

## 2017-08-31 DIAGNOSIS — R0609 Other forms of dyspnea: Secondary | ICD-10-CM | POA: Diagnosis not present

## 2017-08-31 DIAGNOSIS — E785 Hyperlipidemia, unspecified: Secondary | ICD-10-CM

## 2017-08-31 DIAGNOSIS — I2510 Atherosclerotic heart disease of native coronary artery without angina pectoris: Secondary | ICD-10-CM | POA: Insufficient documentation

## 2017-08-31 DIAGNOSIS — I43 Cardiomyopathy in diseases classified elsewhere: Secondary | ICD-10-CM | POA: Insufficient documentation

## 2017-08-31 DIAGNOSIS — I480 Paroxysmal atrial fibrillation: Secondary | ICD-10-CM | POA: Diagnosis not present

## 2017-08-31 DIAGNOSIS — I5032 Chronic diastolic (congestive) heart failure: Secondary | ICD-10-CM | POA: Insufficient documentation

## 2017-08-31 DIAGNOSIS — I119 Hypertensive heart disease without heart failure: Secondary | ICD-10-CM

## 2017-08-31 DIAGNOSIS — I2721 Secondary pulmonary arterial hypertension: Secondary | ICD-10-CM | POA: Diagnosis not present

## 2017-08-31 DIAGNOSIS — I130 Hypertensive heart and chronic kidney disease with heart failure and stage 1 through stage 4 chronic kidney disease, or unspecified chronic kidney disease: Secondary | ICD-10-CM | POA: Diagnosis not present

## 2017-08-31 DIAGNOSIS — N183 Chronic kidney disease, stage 3 (moderate): Secondary | ICD-10-CM | POA: Diagnosis not present

## 2017-08-31 DIAGNOSIS — R002 Palpitations: Secondary | ICD-10-CM | POA: Diagnosis not present

## 2017-08-31 LAB — MYOCARDIAL PERFUSION IMAGING
LV dias vol: 76 mL (ref 46–106)
LV sys vol: 29 mL
Peak HR: 76 {beats}/min
Rest HR: 50 {beats}/min
SDS: 0
SRS: 4
SSS: 4
TID: 1.04

## 2017-08-31 MED ORDER — TECHNETIUM TC 99M TETROFOSMIN IV KIT
30.1000 | PACK | Freq: Once | INTRAVENOUS | Status: AC | PRN
  Administered 2017-08-31: 30.1 via INTRAVENOUS
  Filled 2017-08-31: qty 31

## 2017-08-31 MED ORDER — REGADENOSON 0.4 MG/5ML IV SOLN
0.4000 mg | Freq: Once | INTRAVENOUS | Status: AC
  Administered 2017-08-31: 0.4 mg via INTRAVENOUS

## 2017-08-31 MED ORDER — TECHNETIUM TC 99M TETROFOSMIN IV KIT
10.9000 | PACK | Freq: Once | INTRAVENOUS | Status: AC | PRN
  Administered 2017-08-31: 10.9 via INTRAVENOUS
  Filled 2017-08-31: qty 11

## 2017-08-31 MED ORDER — AMINOPHYLLINE 25 MG/ML IV SOLN
75.0000 mg | Freq: Once | INTRAVENOUS | Status: AC
  Administered 2017-08-31: 75 mg via INTRAVENOUS

## 2017-09-08 ENCOUNTER — Encounter: Payer: Self-pay | Admitting: Cardiology

## 2017-09-08 ENCOUNTER — Ambulatory Visit: Payer: PPO | Admitting: Cardiology

## 2017-09-08 VITALS — BP 130/64 | HR 54 | Ht 61.0 in | Wt 135.0 lb

## 2017-09-08 DIAGNOSIS — Z951 Presence of aortocoronary bypass graft: Secondary | ICD-10-CM | POA: Diagnosis not present

## 2017-09-08 DIAGNOSIS — I2721 Secondary pulmonary arterial hypertension: Secondary | ICD-10-CM

## 2017-09-08 DIAGNOSIS — I480 Paroxysmal atrial fibrillation: Secondary | ICD-10-CM

## 2017-09-08 DIAGNOSIS — Z7901 Long term (current) use of anticoagulants: Secondary | ICD-10-CM

## 2017-09-08 DIAGNOSIS — I43 Cardiomyopathy in diseases classified elsewhere: Secondary | ICD-10-CM

## 2017-09-08 DIAGNOSIS — R079 Chest pain, unspecified: Secondary | ICD-10-CM | POA: Diagnosis not present

## 2017-09-08 DIAGNOSIS — E785 Hyperlipidemia, unspecified: Secondary | ICD-10-CM

## 2017-09-08 DIAGNOSIS — I119 Hypertensive heart disease without heart failure: Secondary | ICD-10-CM

## 2017-09-08 DIAGNOSIS — I5032 Chronic diastolic (congestive) heart failure: Secondary | ICD-10-CM | POA: Diagnosis not present

## 2017-09-08 DIAGNOSIS — N183 Chronic kidney disease, stage 3 unspecified: Secondary | ICD-10-CM

## 2017-09-08 MED ORDER — ISOSORBIDE MONONITRATE ER 60 MG PO TB24: ORAL_TABLET | ORAL | 6 refills | Status: DC

## 2017-09-08 NOTE — Patient Instructions (Signed)
Medication Instructions: Increase Isosorbide to 60 mg daily.  Take your Amlodipine in the evening around dinner time.   Follow-Up: You have been scheduled for a follow-up appointment with Kerin Ransom, PA on 09/30/17 at 3:00 pm when Dr. [name] is also in the office.

## 2017-09-08 NOTE — Progress Notes (Signed)
09/08/2017 Jacqueline [name]   01-Apr-1931  [id number]  Primary Physician Andria Frames Jamal Collin, MD Primary Cardiologist: Dr [name]  HPI:  84 y.o.femalewith a hx of CAD s/p CABG x 2 in 2006. A Cath done in 2016 showed an occluded LIMA-LAD with filling of the LADS via the SVG-OM. She has a history of paroxysmal atrial fibrillation/flutter requring cardioversion in 2015. She is on Eliquis for anticoagulation. Other problems include a history of prior GI bleed, chronic diastolic CHF, CKD stage III, pulmonary hypertension, HLD and HTN. She was recently admitted with chest pain and tachycardia and was found to be in AF. She was admitted and placed on IV Diltiazem for rate control. The plan was to start Amiodarone and she actually converted before this was started. She was seen in the office 08/18/17. She continued to have intermittent vague chest "pressure". Its not always exertional and she doesn't take anything for it. She denied having any palpitations. It does not radiate to her arms or jaw. She had a Myoview 08/31/17 that was low risk. He symptoms improved some with the addition of Norvasc though she tells me last week when she went to vacuum she had chest tightness that made her stop. She admits to exertional fatigue-"I just can't do anything".      Current Outpatient Medications  Medication Sig Dispense Refill  . acetaminophen (TYLENOL) 500 MG tablet Take 500-1,000 mg by mouth every 6 (six) hours as needed for moderate pain or headache.    . ALPRAZolam (XANAX) 0.5 MG tablet TAKE 1 TABLET BY MOUTH TWICE A DAY AS NEEDED FOR ANXIETY (Patient taking differently: TAKE 0.5 MG BY MOUTH AT BEDTIME AND 0.25 MG BY MOUTH DAILY  AS NEEDED FOR ANXIETY) 60 tablet 5  . amiodarone (PACERONE) 200 MG tablet Take 1 tablet (200 mg total) by mouth daily. 90 tablet 3  . amLODipine (NORVASC) 5 MG tablet Take 1 tablet (5 mg total) by mouth daily. 180 tablet 3  . apixaban (ELIQUIS) 2.5 MG TABS tablet Take 1 tablet (2.5 mg  total) 2 (two) times daily by mouth. 28 tablet 0  . furosemide (LASIX) 20 MG tablet TAKE 1 TABLET BY MOUTH EVERY DAY 90 tablet 3  . gabapentin (NEURONTIN) 100 MG capsule TAKE 1 CAPSULE (100 MG TOTAL) BY MOUTH 3 (THREE) TIMES DAILY. 270 capsule 3  . Iron-Vitamins (GERITOL COMPLETE PO) Take 1 tablet by mouth daily.    . isosorbide mononitrate (IMDUR) 30 MG 24 hr tablet TAKE 1 TABLET BY MOUTH EVERY DAY 90 tablet 3  . metoprolol succinate (TOPROL-XL) 25 MG 24 hr tablet Take 1 tablet (25 mg total) by mouth daily. 90 tablet 3  . nitroGLYCERIN (NITROSTAT) 0.4 MG SL tablet PLACE 1 TABLET (0.4 MG TOTAL) UNDER THE TONGUE EVERY 5 (FIVE) MINUTES AS NEEDED FOR CHEST PAIN. X 3 doses 25 tablet 1  . Omega-3 Fatty Acids (FISH OIL) 1000 MG CAPS Take 1-2 capsules by mouth See admin instructions. Takes one in the morning and two capsules at night.    Marland Kitchen omeprazole (PRILOSEC) 20 MG capsule TAKE ONE CAPSULE BY MOUTH EVERY DAY 90 capsule 3  . polyethylene glycol powder (GLYCOLAX/MIRALAX) powder MIX 17 G AND TAKE BY MOUTH DAILY. (Patient taking differently: MIX 17 G AND TAKE BY MOUTH DAILY PRN) 527 g 12  . rOPINIRole (REQUIP) 0.25 MG tablet TAKE 1 TABLET BY MOUTH 3 TIMES A DAY (Patient taking differently: TAKE 0.25MG  BY MOUTH ONCE DAILY) 270 tablet 3  . simvastatin (ZOCOR) 20 MG  tablet Take 1 tablet (20 mg total) by mouth at bedtime. 90 tablet 3  . traMADol (ULTRAM) 50 MG tablet TAKE 1 TABLET BY MOUTH TWICE A DAY AS NEEDED FOR PAIN 60 tablet 4  . traZODone (DESYREL) 100 MG tablet TAKE 1 TABLET (100 MG TOTAL) BY MOUTH AT BEDTIME. 90 tablet 3   No current facility-administered medications for this visit.     Allergies  Allergen Reactions  . Atorvastatin Other (See Comments)    Myalgias in legs  . Crestor [Rosuvastatin] Other (See Comments)    Myalgias in legs  . Morphine And Related Other (See Comments)    "Crazy thoughts, severe headache, sick"  . Prednisone     On two occasions has caused A fib    Past Medical  History:  Diagnosis Date  . Arthritis    back  . Atrial fibrillation with rapid ventricular response ([place]) 02/2014   a. CHA2DS2VASc = 6 -> on eliquis;  b. 02/2014 s/p DCCV;  c. 08/2014 Echo: EF 55-60%, Gr 2 DD, mild MR, triv AI.  Marland Kitchen CAD (coronary artery disease)    a. s/p CABG x 2 (LIMA->LAD, VG->Diag);  b. 08/2014 Cath: LM nl, LAD 90p, LCX nl, RCA nl/dominant, LIMA->LAD atretic, VG->D2 patent w/ retrograde filling of LAD, EF 55-60%-->Med Rx.  . Diverticulitis 02/21/2012  . GERD (gastroesophageal reflux disease)   . Hyperlipemia   . Hypertension   . Insomnia     Social History   Socioeconomic History  . Marital status: Widowed    Spouse name: Not on file  . Number of children: Not on file  . Years of education: Not on file  . Highest education level: Not on file  Social Needs  . Financial resource strain: Not on file  . Food insecurity - worry: Not on file  . Food insecurity - inability: Not on file  . Transportation needs - medical: Not on file  . Transportation needs - non-medical: Not on file  Occupational History  . Not on file  Tobacco Use  . Smoking status: Never Smoker  . Smokeless tobacco: Never Used  Substance and Sexual Activity  . Alcohol use: No  . Drug use: No  . Sexual activity: No  Other Topics Concern  . 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 "I believe Jesus died on the cross to save me [id number] my sins. I am saved by the grace of God." 05/11/2017    Other: "I have a good life and am happy. My family is there when I need help." 05/11/2017   L. Silvano Rusk, RN, BSN  Family History  Problem Relation Age of Onset  . Parkinson's disease Mother   . Heart attack Brother   . Arthritis Brother   . Cancer Brother        Unknown  . Breast cancer Neg Hx      Review of Systems: General: negative for chills, fever, night sweats or weight changes.  Cardiovascular: negative for chest pain, dyspnea on exertion, edema, orthopnea, palpitations, paroxysmal nocturnal dyspnea or shortness of breath Dermatological: negative for rash Respiratory: negative for cough or wheezing Urologic: negative for hematuria Abdominal: negative for nausea, vomiting, diarrhea, bright red blood per rectum, melena, or hematemesis Neurologic: negative for visual changes, syncope, or dizziness All other systems reviewed and are otherwise negative except as noted above.    There were no vitals taken for this visit.  General appearance: alert, cooperative, appears stated age and no distress Lungs: clear to auscultation bilaterally Heart: regular rate and rhythm Extremities: extremities normal, atraumatic, no cyanosis or edema Skin: Skin color, texture, turgor normal. No rashes or lesions Neurologic: Grossly normal   ASSESSMENT AND PLAN:    Chest pain with moderate risk of acute coronary syndrome She continues to have SSCP worrisome for angina. She is on Imdur and Norvasc, Myoview low risk.   Hypertension, uncontrolled B/P 130/64. She tells me she had low B/P in the morning after taking her medications.   Chronic diastolic CHF (congestive heart failure) (HCC) Has both diastolic dysfunction and right heart failure. Dry weight is 127-130  CKD (chronic kidney disease) stage 3, GFR 30-59 ml/min (HCC) Last SCr 1.27-GFR 40  Hx of CABG CABG x 2 2006- Cath in 2016  showed occluded LIMA to LAD with a patent SVG-Dx that filled the LAD  PAF (paroxysmal atrial fibrillation) ([place]) Recurrent PAF Dec 2018-placed on Amiodarone  Pulmonary arterial hypertension ([place]) 2353 TTE: Systolic pressure was severely increased. PA peak pressure: 85 mm Hg   Long term current use of anticoagulant CHADS VASC=6- on Eliquis  Hypertensive cardiomyopathy, without heart failure (HCC) Normal LVF with grade 2 DD    PLAN  I'm still concerned about her chest pain, though Myoview reassuring. I suggested we increase her Imdur to 60 mg and she take her Norvasc in the eveneing. I'll see her back in 2 weeks. If she is not significantly improved I'll discuss angiogram with Dr [name].   Kerin Ransom PA-C 09/08/2017 1:21 PM

## 2017-09-09 NOTE — Progress Notes (Signed)
Understood, agree. Ok to move slow as long as her symtoms are only exertional.

## 2017-09-11 ENCOUNTER — Encounter (HOSPITAL_COMMUNITY): Payer: Self-pay | Admitting: *Deleted

## 2017-09-11 ENCOUNTER — Other Ambulatory Visit: Payer: Self-pay

## 2017-09-11 ENCOUNTER — Emergency Department (HOSPITAL_COMMUNITY): Payer: PPO

## 2017-09-11 DIAGNOSIS — Z8249 Family history of ischemic heart disease and other diseases of the circulatory system: Secondary | ICD-10-CM | POA: Diagnosis not present

## 2017-09-11 DIAGNOSIS — Z7901 Long term (current) use of anticoagulants: Secondary | ICD-10-CM | POA: Diagnosis not present

## 2017-09-11 DIAGNOSIS — R0602 Shortness of breath: Secondary | ICD-10-CM | POA: Diagnosis not present

## 2017-09-11 DIAGNOSIS — N179 Acute kidney failure, unspecified: Secondary | ICD-10-CM | POA: Insufficient documentation

## 2017-09-11 DIAGNOSIS — G629 Polyneuropathy, unspecified: Secondary | ICD-10-CM | POA: Diagnosis not present

## 2017-09-11 DIAGNOSIS — G4700 Insomnia, unspecified: Secondary | ICD-10-CM | POA: Insufficient documentation

## 2017-09-11 DIAGNOSIS — Z79899 Other long term (current) drug therapy: Secondary | ICD-10-CM | POA: Insufficient documentation

## 2017-09-11 DIAGNOSIS — Z8261 Family history of arthritis: Secondary | ICD-10-CM | POA: Diagnosis not present

## 2017-09-11 DIAGNOSIS — N183 Chronic kidney disease, stage 3 (moderate): Secondary | ICD-10-CM | POA: Insufficient documentation

## 2017-09-11 DIAGNOSIS — F419 Anxiety disorder, unspecified: Secondary | ICD-10-CM | POA: Insufficient documentation

## 2017-09-11 DIAGNOSIS — I482 Chronic atrial fibrillation: Secondary | ICD-10-CM | POA: Diagnosis not present

## 2017-09-11 DIAGNOSIS — E785 Hyperlipidemia, unspecified: Secondary | ICD-10-CM | POA: Insufficient documentation

## 2017-09-11 DIAGNOSIS — Z820 Family history of epilepsy and other diseases of the nervous system: Secondary | ICD-10-CM | POA: Insufficient documentation

## 2017-09-11 DIAGNOSIS — M479 Spondylosis, unspecified: Secondary | ICD-10-CM | POA: Insufficient documentation

## 2017-09-11 DIAGNOSIS — R82998 Other abnormal findings in urine: Secondary | ICD-10-CM | POA: Diagnosis not present

## 2017-09-11 DIAGNOSIS — R001 Bradycardia, unspecified: Secondary | ICD-10-CM | POA: Diagnosis not present

## 2017-09-11 DIAGNOSIS — I5032 Chronic diastolic (congestive) heart failure: Secondary | ICD-10-CM | POA: Diagnosis not present

## 2017-09-11 DIAGNOSIS — Z885 Allergy status to narcotic agent status: Secondary | ICD-10-CM | POA: Insufficient documentation

## 2017-09-11 DIAGNOSIS — M1990 Unspecified osteoarthritis, unspecified site: Secondary | ICD-10-CM | POA: Diagnosis not present

## 2017-09-11 DIAGNOSIS — Z809 Family history of malignant neoplasm, unspecified: Secondary | ICD-10-CM | POA: Diagnosis not present

## 2017-09-11 DIAGNOSIS — I2510 Atherosclerotic heart disease of native coronary artery without angina pectoris: Secondary | ICD-10-CM | POA: Diagnosis not present

## 2017-09-11 DIAGNOSIS — K219 Gastro-esophageal reflux disease without esophagitis: Secondary | ICD-10-CM | POA: Insufficient documentation

## 2017-09-11 DIAGNOSIS — Z888 Allergy status to other drugs, medicaments and biological substances status: Secondary | ICD-10-CM | POA: Insufficient documentation

## 2017-09-11 DIAGNOSIS — E1122 Type 2 diabetes mellitus with diabetic chronic kidney disease: Secondary | ICD-10-CM | POA: Diagnosis not present

## 2017-09-11 DIAGNOSIS — I130 Hypertensive heart and chronic kidney disease with heart failure and stage 1 through stage 4 chronic kidney disease, or unspecified chronic kidney disease: Secondary | ICD-10-CM | POA: Diagnosis not present

## 2017-09-11 DIAGNOSIS — R0789 Other chest pain: Secondary | ICD-10-CM | POA: Diagnosis not present

## 2017-09-11 LAB — CBC
HEMATOCRIT: 31.2 % — AB (ref 36.0–46.0)
Hemoglobin: 10.2 g/dL — ABNORMAL LOW (ref 12.0–15.0)
MCH: 30.9 pg (ref 26.0–34.0)
MCHC: 32.7 g/dL (ref 30.0–36.0)
MCV: 94.5 fL (ref 78.0–100.0)
Platelets: 152 10*3/uL (ref 150–400)
RBC: 3.3 MIL/uL — ABNORMAL LOW (ref 3.87–5.11)
RDW: 14.3 % (ref 11.5–15.5)
WBC: 3.8 10*3/uL — ABNORMAL LOW (ref 4.0–10.5)

## 2017-09-11 LAB — I-STAT TROPONIN, ED: Troponin i, poc: 0 ng/mL (ref 0.00–0.08)

## 2017-09-11 LAB — BASIC METABOLIC PANEL
Anion gap: 10 (ref 5–15)
BUN: 22 mg/dL — AB (ref 6–20)
CHLORIDE: 106 mmol/L (ref 101–111)
CO2: 23 mmol/L (ref 22–32)
Calcium: 9 mg/dL (ref 8.9–10.3)
Creatinine, Ser: 2 mg/dL — ABNORMAL HIGH (ref 0.44–1.00)
GFR calc Af Amer: 25 mL/min — ABNORMAL LOW (ref >60)
GFR, EST NON AFRICAN AMERICAN: 22 mL/min — AB (ref >60)
GLUCOSE: 127 mg/dL — AB (ref 65–99)
POTASSIUM: 4 mmol/L (ref 3.5–5.1)
Sodium: 139 mmol/L (ref 135–145)

## 2017-09-11 NOTE — ED Triage Notes (Signed)
Pt reports chest pressure for extended amount of time that radiates into her back. States the pain is intermittent, mild relief with nitro pta. Has sob. No acute distress noted at triage. Denies swelling to extremities.

## 2017-09-12 ENCOUNTER — Other Ambulatory Visit: Payer: Self-pay

## 2017-09-12 ENCOUNTER — Observation Stay (HOSPITAL_COMMUNITY)
Admission: EM | Admit: 2017-09-12 | Discharge: 2017-09-13 | Disposition: A | Discharge status: Home / Self Care | Payer: PPO | Attending: Family Medicine | Admitting: Family Medicine

## 2017-09-12 DIAGNOSIS — R0789 Other chest pain: Secondary | ICD-10-CM

## 2017-09-12 DIAGNOSIS — R079 Chest pain, unspecified: Secondary | ICD-10-CM | POA: Diagnosis present

## 2017-09-12 DIAGNOSIS — I200 Unstable angina: Secondary | ICD-10-CM

## 2017-09-12 DIAGNOSIS — N179 Acute kidney failure, unspecified: Secondary | ICD-10-CM | POA: Diagnosis not present

## 2017-09-12 DIAGNOSIS — I10 Essential (primary) hypertension: Secondary | ICD-10-CM

## 2017-09-12 LAB — URINALYSIS, ROUTINE W REFLEX MICROSCOPIC
Bacteria, UA: NONE SEEN
Bilirubin Urine: NEGATIVE
Glucose, UA: NEGATIVE mg/dL
Hgb urine dipstick: NEGATIVE
KETONES UR: NEGATIVE mg/dL
Nitrite: NEGATIVE
PH: 8 (ref 5.0–8.0)
Protein, ur: NEGATIVE mg/dL
SPECIFIC GRAVITY, URINE: 1.009 (ref 1.005–1.030)

## 2017-09-12 LAB — I-STAT TROPONIN, ED: TROPONIN I, POC: 0 ng/mL (ref 0.00–0.08)

## 2017-09-12 LAB — BASIC METABOLIC PANEL
Anion gap: 10 (ref 5–15)
BUN: 20 mg/dL (ref 6–20)
CHLORIDE: 107 mmol/L (ref 101–111)
CO2: 25 mmol/L (ref 22–32)
Calcium: 9.3 mg/dL (ref 8.9–10.3)
Creatinine, Ser: 1.69 mg/dL — ABNORMAL HIGH (ref 0.44–1.00)
GFR calc Af Amer: 31 mL/min — ABNORMAL LOW (ref >60)
GFR calc non Af Amer: 27 mL/min — ABNORMAL LOW (ref >60)
GLUCOSE: 93 mg/dL (ref 65–99)
Potassium: 4.3 mmol/L (ref 3.5–5.1)
SODIUM: 142 mmol/L (ref 135–145)

## 2017-09-12 LAB — CBC
HCT: 31.6 % — ABNORMAL LOW (ref 36.0–46.0)
HEMOGLOBIN: 10.5 g/dL — AB (ref 12.0–15.0)
MCH: 31.3 pg (ref 26.0–34.0)
MCHC: 33.2 g/dL (ref 30.0–36.0)
MCV: 94.3 fL (ref 78.0–100.0)
PLATELETS: 152 10*3/uL (ref 150–400)
RBC: 3.35 MIL/uL — ABNORMAL LOW (ref 3.87–5.11)
RDW: 14.2 % (ref 11.5–15.5)
WBC: 3.4 10*3/uL — ABNORMAL LOW (ref 4.0–10.5)

## 2017-09-12 LAB — MRSA PCR SCREENING: MRSA by PCR: NEGATIVE

## 2017-09-12 LAB — TROPONIN I: Troponin I: <0.03 ng/mL (ref <0.03)

## 2017-09-12 MED ORDER — LOPERAMIDE HCL 2 MG PO CAPS
2.0000 mg | ORAL_CAPSULE | ORAL | Status: DC | PRN
  Administered 2017-09-12: 2 mg via ORAL
  Filled 2017-09-12: qty 1

## 2017-09-12 MED ORDER — METOPROLOL SUCCINATE ER 25 MG PO TB24
12.5000 mg | ORAL_TABLET | Freq: Every day | ORAL | Status: DC
  Administered 2017-09-12 – 2017-09-13 (×2): 12.5 mg via ORAL
  Filled 2017-09-12 (×3): qty 1

## 2017-09-12 MED ORDER — ISOSORBIDE MONONITRATE ER 60 MG PO TB24
60.0000 mg | ORAL_TABLET | Freq: Every day | ORAL | Status: DC
  Administered 2017-09-12 – 2017-09-13 (×2): 60 mg via ORAL
  Filled 2017-09-12: qty 1
  Filled 2017-09-12: qty 2

## 2017-09-12 MED ORDER — ROPINIROLE HCL 0.5 MG PO TABS
0.2500 mg | ORAL_TABLET | Freq: Three times a day (TID) | ORAL | Status: DC
  Administered 2017-09-12 – 2017-09-13 (×4): 0.25 mg via ORAL
  Filled 2017-09-12 (×6): qty 1

## 2017-09-12 MED ORDER — ALPRAZOLAM 0.5 MG PO TABS: 0.5000 mg | ORAL_TABLET | Freq: Two times a day (BID) | ORAL | Status: DC | PRN

## 2017-09-12 MED ORDER — TRAMADOL HCL 50 MG PO TABS: 50.0000 mg | ORAL_TABLET | Freq: Two times a day (BID) | ORAL | Status: DC | PRN

## 2017-09-12 MED ORDER — GABAPENTIN 100 MG PO CAPS
100.0000 mg | ORAL_CAPSULE | Freq: Three times a day (TID) | ORAL | Status: DC
  Administered 2017-09-12 – 2017-09-13 (×5): 100 mg via ORAL
  Filled 2017-09-12 (×5): qty 1

## 2017-09-12 MED ORDER — SIMVASTATIN 20 MG PO TABS
20.0000 mg | ORAL_TABLET | Freq: Every day | ORAL | Status: DC
  Administered 2017-09-12: 20 mg via ORAL
  Filled 2017-09-12: qty 1

## 2017-09-12 MED ORDER — ASPIRIN 81 MG PO CHEW
324.0000 mg | CHEWABLE_TABLET | Freq: Once | ORAL | Status: AC
Start: 2017-09-12 — End: 2017-09-12
  Administered 2017-09-12: 324 mg via ORAL
  Filled 2017-09-12: qty 4

## 2017-09-12 MED ORDER — PANTOPRAZOLE SODIUM 40 MG PO TBEC
40.0000 mg | DELAYED_RELEASE_TABLET | Freq: Every day | ORAL | Status: DC
  Administered 2017-09-12 – 2017-09-13 (×2): 40 mg via ORAL
  Filled 2017-09-12 (×2): qty 1

## 2017-09-12 MED ORDER — AMLODIPINE BESYLATE 5 MG PO TABS
5.0000 mg | ORAL_TABLET | Freq: Every day | ORAL | Status: DC
  Administered 2017-09-12 – 2017-09-13 (×2): 5 mg via ORAL
  Filled 2017-09-12 (×2): qty 1

## 2017-09-12 MED ORDER — NITROGLYCERIN 0.4 MG SL SUBL
0.4000 mg | SUBLINGUAL_TABLET | SUBLINGUAL | Status: DC | PRN
  Administered 2017-09-12: 0.4 mg via SUBLINGUAL
  Filled 2017-09-12: qty 1

## 2017-09-12 MED ORDER — ADULT MULTIVITAMIN W/MINERALS CH
1.0000 | ORAL_TABLET | Freq: Every day | ORAL | Status: DC
  Administered 2017-09-12 – 2017-09-13 (×2): 1 via ORAL
  Filled 2017-09-12 (×2): qty 1

## 2017-09-12 MED ORDER — ACETAMINOPHEN 325 MG PO TABS: 650.0000 mg | ORAL_TABLET | ORAL | Status: DC | PRN

## 2017-09-12 MED ORDER — HEPARIN (PORCINE) IN NACL 100-0.45 UNIT/ML-% IJ SOLN
750.0000 [IU]/h | INTRAMUSCULAR | Status: DC
Start: 2017-09-13 — End: 2017-09-13
  Administered 2017-09-13: 750 [IU]/h via INTRAVENOUS
  Filled 2017-09-12: qty 250

## 2017-09-12 MED ORDER — AMIODARONE HCL 200 MG PO TABS
200.0000 mg | ORAL_TABLET | Freq: Every day | ORAL | Status: DC
  Administered 2017-09-12 – 2017-09-13 (×2): 200 mg via ORAL
  Filled 2017-09-12 (×2): qty 1

## 2017-09-12 MED ORDER — FENTANYL CITRATE (PF) 100 MCG/2ML IJ SOLN
50.0000 ug | Freq: Once | INTRAMUSCULAR | Status: AC
  Administered 2017-09-12: 50 ug via INTRAVENOUS
  Filled 2017-09-12: qty 2

## 2017-09-12 MED ORDER — HEPARIN BOLUS VIA INFUSION
3500.0000 [IU] | Freq: Once | INTRAVENOUS | Status: AC
  Administered 2017-09-13: 3500 [IU] via INTRAVENOUS
  Filled 2017-09-12: qty 3500

## 2017-09-12 MED ORDER — ONDANSETRON HCL 4 MG/2ML IJ SOLN: 4.0000 mg | Freq: Four times a day (QID) | INTRAMUSCULAR | Status: DC | PRN

## 2017-09-12 MED ORDER — TRAZODONE HCL 100 MG PO TABS
100.0000 mg | ORAL_TABLET | Freq: Every day | ORAL | Status: DC
  Administered 2017-09-12: 100 mg via ORAL
  Filled 2017-09-12: qty 1

## 2017-09-12 MED ORDER — FUROSEMIDE 20 MG PO TABS: 20.0000 mg | ORAL_TABLET | Freq: Every day | ORAL | Status: DC

## 2017-09-12 MED ORDER — APIXABAN 2.5 MG PO TABS
2.5000 mg | ORAL_TABLET | Freq: Two times a day (BID) | ORAL | Status: DC
  Administered 2017-09-12: 2.5 mg via ORAL
  Filled 2017-09-12 (×2): qty 1

## 2017-09-12 NOTE — ED Provider Notes (Signed)
TIME SEEN: 2:34 AM  CHIEF COMPLAINT: Chest pain  HPI: Patient is an 84 year old female with history of atrial fibrillation on Eliquis, CAD status post CABG x2 in 2006, last cardiac catheterization 2016, CHF, hypertension, hyperlipidemia, chronic kidney disease who presents emergency department chest pain.  Described as a diffuse anterior tightness that radiates into her back with associated shortness of breath.  No nausea, vomiting, diaphoresis or dizziness.  Feels like her anginal equivalent and is progressively getting worse.  It is worse with exertion and better with nitroglycerin.  Not completely resolved at this time.  Last Myoview was August 31, 2017 which was low risk.  Per last cardiology note on 13 February they were concerned that this chest pain and thought angiogram may be indicated.  Patient also states that they have mentioned a repeat cardiac catheterization.  Last was 2016.  They recently went up on her Imdur on the 13th with no improvement of symptoms.   ROS: See HPI Constitutional: no fever  Eyes: no drainage  ENT: no runny nose   Cardiovascular:   chest pain  Resp: no SOB  GI: no vomiting GU: no dysuria Integumentary: no rash  Allergy: no hives  Musculoskeletal: no leg swelling  Neurological: no slurred speech ROS otherwise negative  PAST MEDICAL HISTORY/PAST SURGICAL HISTORY:  Past Medical History:  Diagnosis Date  . Arthritis    back  . Atrial fibrillation with rapid ventricular response ([place]) 02/2014   a. CHA2DS2VASc = 6 -> on eliquis;  b. 02/2014 s/p DCCV;  c. 08/2014 Echo: EF 55-60%, Gr 2 DD, mild MR, triv AI.  Marland Kitchen CAD (coronary artery disease)    a. s/p CABG x 2 (LIMA->LAD, VG->Diag);  b. 08/2014 Cath: LM nl, LAD 90p, LCX nl, RCA nl/dominant, LIMA->LAD atretic, VG->D2 patent w/ retrograde filling of LAD, EF 55-60%-->Med Rx.  . Diverticulitis 02/21/2012  . GERD (gastroesophageal reflux disease)   . Hyperlipemia   . Hypertension   . Insomnia     MEDICATIONS:   Prior to Admission medications   Medication Sig Start Date End Date Taking? Authorizing Provider  acetaminophen (TYLENOL) 500 MG tablet Take 500-1,000 mg by mouth every 6 (six) hours as needed for moderate pain or headache.    [provider]  ALPRAZolam (XANAX) 0.5 MG tablet TAKE 1 TABLET BY MOUTH TWICE A DAY AS NEEDED FOR ANXIETY Patient taking differently: TAKE 0.5 MG BY MOUTH AT BEDTIME AND 0.25 MG BY MOUTH DAILY  AS NEEDED FOR ANXIETY 04/19/17   Hensel, Jamal Collin, MD  amiodarone (PACERONE) 200 MG tablet Take 1 tablet (200 mg total) by mouth daily. 08/17/17   [name], MD  amLODipine (NORVASC) 5 MG tablet Take 1 tablet (5 mg total) by mouth daily. 08/18/17 11/16/17  Erlene Quan, PA-C  apixaban (ELIQUIS) 2.5 MG TABS tablet Take 1 tablet (2.5 mg total) 2 (two) times daily by mouth. 06/03/17   Croitoru, Mihai, MD  furosemide (LASIX) 20 MG tablet TAKE 1 TABLET BY MOUTH EVERY DAY 07/12/17   Croitoru, Mihai, MD  gabapentin (NEURONTIN) 100 MG capsule TAKE 1 CAPSULE (100 MG TOTAL) BY MOUTH 3 (THREE) TIMES DAILY. 04/12/17   [name], MD  Iron-Vitamins (GERITOL COMPLETE PO) Take 1 tablet by mouth daily.    [provider]  isosorbide mononitrate (IMDUR) 60 MG 24 hr tablet TAKE 1 TABLET BY MOUTH EVERY DAY 09/08/17   Erlene Quan, PA-C  metoprolol succinate (TOPROL-XL) 25 MG 24 hr tablet Take 1 tablet (25 mg total)  by mouth daily. 03/12/17   Strader, Fransisco Hertz, PA-C  nitroGLYCERIN (NITROSTAT) 0.4 MG SL tablet PLACE 1 TABLET (0.4 MG TOTAL) UNDER THE TONGUE EVERY 5 (FIVE) MINUTES AS NEEDED FOR CHEST PAIN. X 3 doses 07/02/17   Hilty, Nadean Corwin, MD  Omega-3 Fatty Acids (FISH OIL) 1000 MG CAPS Take 1-2 capsules by mouth See admin instructions. Takes one in the morning and two capsules at night.    [provider]  omeprazole (PRILOSEC) 20 MG capsule TAKE ONE CAPSULE BY MOUTH EVERY DAY 02/05/17   Hensel, Jamal Collin, MD  polyethylene glycol powder (GLYCOLAX/MIRALAX) powder  MIX 17 G AND TAKE BY MOUTH DAILY. Patient taking differently: MIX 17 G AND TAKE BY MOUTH DAILY PRN 05/23/17   [name], MD  rOPINIRole (REQUIP) 0.25 MG tablet TAKE 1 TABLET BY MOUTH 3 TIMES A DAY Patient taking differently: TAKE 0.25MG  BY MOUTH ONCE DAILY 12/14/16   [name], MD  simvastatin (ZOCOR) 20 MG tablet Take 1 tablet (20 mg total) by mouth at bedtime. 08/17/17   [name], MD  traMADol (ULTRAM) 50 MG tablet TAKE 1 TABLET BY MOUTH TWICE A DAY AS NEEDED FOR PAIN 07/12/17   [name], MD  traZODone (DESYREL) 100 MG tablet TAKE 1 TABLET (100 MG TOTAL) BY MOUTH AT BEDTIME. 10/16/16   [name], MD    ALLERGIES:  Allergies  Allergen Reactions  . Atorvastatin Other (See Comments)    Myalgias in legs  . Crestor [Rosuvastatin] Other (See Comments)    Myalgias in legs  . Morphine And Related Other (See Comments)    "Crazy thoughts, severe headache, sick"  . Prednisone     On two occasions has caused A fib    SOCIAL HISTORY:  Social History   Tobacco Use  . Smoking status: Never Smoker  . Smokeless tobacco: Never Used  Substance Use Topics  . Alcohol use: No    FAMILY HISTORY: Family History  Problem Relation Age of Onset  . Parkinson's disease Mother   . Heart attack Brother   . Arthritis Brother   . Cancer Brother        Unknown  . Breast cancer Neg Hx     EXAM: BP (!) 145/73 (BP Location: Right Arm)   Pulse (!) 56   Temp 98.4 F (36.9 C) (Oral)   Resp 19   SpO2 97%  CONSTITUTIONAL: Alert and oriented and responds appropriately to questions. Well-appearing; well-nourished HEAD: Normocephalic EYES: Conjunctivae clear, pupils appear equal, EOMI ENT: normal nose; moist mucous membranes NECK: Supple, no meningismus, no nuchal rigidity, no LAD  CARD: RRR; S1 and S2 appreciated; no murmurs, no clicks, no rubs, no gallops RESP: Normal chest excursion without splinting or tachypnea; breath sounds clear and equal bilaterally; no  wheezes, no rhonchi, no rales, no hypoxia or respiratory distress, speaking full sentences ABD/GI: Normal bowel sounds; non-distended; soft, non-tender, no rebound, no guarding, no peritoneal signs, no hepatosplenomegaly BACK:  The back appears normal and is non-tender to palpation, there is no CVA tenderness EXT: Normal ROM in all joints; non-tender to palpation; no edema; normal capillary refill; no cyanosis, no calf tenderness or swelling    SKIN: Normal color for age and race; warm; no rash NEURO: Moves all extremities equally PSYCH: The patient's mood and manner are appropriate. Grooming and personal hygiene are appropriate.  MEDICAL DECISION MAKING: Patient here with concerning symptoms for unstable angina.  Will give aspirin, nitroglycerin.  Discussed with cardiology fellow Dr.  Devineni who will see patient in the emergency department.  Anticipate admission.  He would like for [place] to hold on a heparin drip at this time until he is seen the patient.  EKG x2 shows no ischemia.  She has had 2 negative troponins here.  Chest x-ray is clear.  ED PROGRESS: Cardiology fellow has seen the patient.  He feels there are some concerning symptoms and also some atypical symptoms.  Does not think she needs heparin and nitroglycerin drip at this time.  States he would recommend something other than nitroglycerin for her pleuritic pain.  He also thinks she may have been slightly over diuresed given the elevation of her creatinine from baseline.  Recommends medicine admission and cardiology will follow in consult.  PCP is with family medicine.  4:22 AM Discussed patient's case with FM resident, Dr. Shawna Orleans.  I have recommended admission and patient (and family if present) agree with this plan. Admitting physician will place admission orders.   I reviewed all nursing notes, vitals, pertinent previous records, EKGs, lab and urine results, imaging (as available).     EKG Interpretation  Date/Time:  Saturday  September 11 2017 18:28:24 EST Ventricular Rate:  53 PR Interval:  208 QRS Duration: 88 QT Interval:  460 QTC Calculation: 431 R Axis:   39 Text Interpretation:  Sinus bradycardia Otherwise normal ECG Confirmed by Tanna Furry [phone]) on 09/12/2017 1:24:23 AM          Anet Logsdon, Delice Bison, DO 09/12/17 0423

## 2017-09-12 NOTE — Consult Note (Signed)
CARDIOLOGY CONSULT NOTE   Referring Physician: Dr. Leonides Schanz Primary Physician: Dr. Andria Frames Primary Cardiologist: Dr. [name], [facility] Reason for Consultation: Chest pain  HPI:  Patient is an 84 y/o F with hx of CAD with cath from 08/2014 showing atretic LIMA to LAD with good flow provided by other conduits,  AKI on CKD baseline Cr of 1.5, Diastolic heart failure, chronic, appears comfortable, Questionable pulmonary HTN with prominent V wave in 2016, Hx of afib/flutter s/p cardioversion on amio, HTN, HLP, Hx of GI bleed comes in today complaining of substernal chest pressure that radiates to her back in b/w her shoulder blades. This discomfort is usually persistent,even at home and at times worsens with minimal exertion while performing her daily activities like vacuuming. This evening the discomfort got worse and became an 8/10 for which she attempted to take a sublingual NTG which didn't help, which prompted her to come to the ER for further care. On arrival, she was given two more sublingual NTG which provided no benefit and given her extensive CAD hx, cardiology was consulted for further recommendations.   She claims that she's been having this discomfort for month. It's currently worse with deep breathing and feels like a pressure on her chest. Occasionally over the last few weeks the discomfort has been radiating down both arms. Given these symptoms she was seen in clinic on 2/13 and had her imdur increased to 60mg , Qday, but this provided little benefit. While in the room she claims her pain is still 8/10, but appears quite comfortable. She denies any sob, but note having some exertional sob, which has been progressively getting worse. She denies any pnd, orthopnea, dizziness, pre-syncope or syncope. She also denies any recent illnesses.   Initial trops x 2 are negative.  Review of Systems:     Cardiac Review of Systems: {Y] = yes [ ]  = no  Chest Pain [ x   ]  Resting SOB [   ] Exertional SOB   [ x ]  Orthopnea [  ]   Pedal Edema [   ]    Palpitations [  ] Syncope  [  ]   Presyncope [   ]  General Review of Systems: [Y] = yes [  ]=no Constitional: recent weight change [  ]; anorexia [  ]; fatigue [ x ]; nausea [  ]; night sweats [  ]; fever [  ]; or chills [  ];                 Eyes : blurred vision [  ]; diplopia [   ]; vision changes [  ];  Amaurosis fugax[  ]; Resp: cough [  ];  wheezing[  ];  hemoptysis[  ];  PND [  ];  GI:  gallstones[  ], vomiting[  ];  dysphagia[  ]; melena[  ];  hematochezia [  ]; heartburn[  ];   GU: kidney stones [  ]; hematuria[  ];   dysuria [  ];  nocturia[  ]; incontinence [  ];             Skin: rash, swelling[  ];, hair loss[  ];  peripheral edema[  ];  or itching[  ]; Musculosketetal: myalgias[  ];  joint swelling[  ];  joint erythema[  ];  joint pain[  ];  back pain[  ];  Heme/Lymph: bruising[  ];  bleeding[  ];  anemia[  ];  Neuro: TIA[  ];  headaches[  ];  stroke[  ];  vertigo[  ];  seizures[  ];   paresthesias[  ];  difficulty walking[  ];  Psych:depression[  ]; anxiety[  ];  Endocrine: diabetes[  ];  thyroid dysfunction[  ];  Other:  Past Medical History:  Diagnosis Date  . Arthritis    back  . Atrial fibrillation with rapid ventricular response ([place]) 02/2014   a. CHA2DS2VASc = 6 -> on eliquis;  b. 02/2014 s/p DCCV;  c. 08/2014 Echo: EF 55-60%, Gr 2 DD, mild MR, triv AI.  Marland Kitchen CAD (coronary artery disease)    a. s/p CABG x 2 (LIMA->LAD, VG->Diag);  b. 08/2014 Cath: LM nl, LAD 90p, LCX nl, RCA nl/dominant, LIMA->LAD atretic, VG->D2 patent w/ retrograde filling of LAD, EF 55-60%-->Med Rx.  . Diverticulitis 02/21/2012  . GERD (gastroesophageal reflux disease)   . Hyperlipemia   . Hypertension   . Insomnia     (Not in a hospital admission)  Infusions: None  Allergies  Allergen Reactions  . Atorvastatin Other (See Comments)    Myalgias in legs  . Crestor [Rosuvastatin] Other (See Comments)    Myalgias in legs  . Morphine And Related Other  (See Comments)    "Crazy thoughts, severe headache, sick"  . Prednisone     On two occasions has caused A fib   Social History   Socioeconomic History  . Marital status: Widowed    Spouse name: Not on file  . Number of children: Not on file  . Years of education: Not on file  . Highest education level: Not on file  Social Needs  . Financial resource strain: Not on file  . Food insecurity - worry: Not on file  . Food insecurity - inability: Not on file  . Transportation needs - medical: Not on file  . Transportation needs - non-medical: Not on file  Occupational History  . Not on file  Tobacco Use  . Smoking status: Never Smoker  . Smokeless tobacco: Never Used  Substance and Sexual Activity  . Alcohol use: No  . Drug use: No  . Sexual activity: No  Other Topics Concern  . 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 "I believe Jesus died on the cross to save me [id number] my sins. I am saved by the grace of God." 05/11/2017   Other: "I have a good life and am happy. My family is there when I need help." 05/11/2017   L. Silvano Rusk, RN, BSN  Family History  Problem Relation Age of Onset  . Parkinson's disease Mother   . Heart attack Brother   . Arthritis Brother   . Cancer Brother        Unknown  . Breast cancer Neg Hx    PHYSICAL EXAM: Vitals:   09/12/17 0228 09/12/17 0230    BP: (!) 145/73 127/66  Pulse: (!) 56 (!) 50  Resp: 19 19  Temp:    SpO2: 97% 97%   No intake or output data in the 24 hours ending 09/12/17 0404  General:  Well appearing. No respiratory difficulty HEENT: normal Neck: supple. no JVD.  Cor: PMI nondisplaced. Regular rate & rhythm. No rubs, gallops or murmurs. Discomfort not reproducible. Lungs: clear Abdomen: soft, nontender, nondistended. No hepatosplenomegaly. No bruits or masses. Good bowel sounds. Extremities: no cyanosis, clubbing, rash, edema Neuro: alert & oriented x 3, cranial nerves grossly intact. moves all 4 extremities w/o difficulty. Affect pleasant.  ECG:  Results for orders placed or performed during the hospital encounter of 09/12/17 (from the past 24 hour(s))  Basic metabolic panel     Status: Abnormal   Collection Time: 09/11/17  6:31 PM  Result Value Ref Range   Sodium 139 135 - 145 mmol/L   Potassium 4.0 3.5 - 5.1 mmol/L   Chloride 106 101 - 111 mmol/L   CO2 23 22 - 32 mmol/L   Glucose, Bld 127 (H) 65 - 99 mg/dL   BUN 22 (H) 6 - 20 mg/dL   Creatinine, Ser 2.00 (H) 0.44 - 1.00 mg/dL   Calcium 9.0 8.9 - 10.3 mg/dL   GFR calc non Af Amer 22 (L) >60 mL/min   GFR calc Af Amer 25 (L) >60 mL/min   Anion gap 10 5 - 15  CBC     Status: Abnormal   Collection Time: 09/11/17  6:31 PM  Result Value Ref Range   WBC 3.8 (L) 4.0 - 10.5 K/uL   RBC 3.30 (L) 3.87 - 5.11 MIL/uL   Hemoglobin 10.2 (L) 12.0 - 15.0 g/dL   HCT 31.2 (L) 36.0 - 46.0 %   MCV 94.5 78.0 - 100.0 fL   MCH 30.9 26.0 - 34.0 pg   MCHC 32.7 30.0 - 36.0 g/dL   RDW 14.3 11.5 - 15.5 %   Platelets 152 150 - 400 K/uL  I-stat troponin, ED     Status: None   Collection Time: 09/11/17  7:17 PM  Result Value Ref Range   Troponin i, poc 0.00 0.00 - 0.08 ng/mL   Comment 3          I-Stat Troponin, ED (not at [facility])     Status: None   Collection Time: 09/12/17 12:52 AM  Result Value Ref Range   Troponin i, poc 0.00 0.00 - 0.08 ng/mL   Comment 3            Dg Chest 2 View  Result Date: 09/11/2017 CLINICAL DATA:  84 y/o F; chest pressure radiating to the back. Shortness of breath. EXAM: CHEST  2 VIEW COMPARISON:  07/21/2017 chest radiograph FINDINGS: Normal cardiac silhouette. Aortic atherosclerosis with calcification. Post median sternotomy with wires in alignment. Pulmonary venous hypertension. No consolidation. No pleural effusion or pneumothorax. Right shoulder rotator cuff repair anchors noted. No acute osseous abnormality identified. IMPRESSION: Pulmonary venous hypertension. Stable cardiac silhouette. No consolidation or effusion. Electronically Signed   By: Kristine Garbe M.D.   On: 09/11/2017 19:51   ASSESSMENT:  Atypical chest pain with exertional  symptoms, with underlying pleurisy? CAD with cath from 08/2014 showing atretic LIMA to LAD with good flow provided by other conduits AKI on CKD baseline Cr of 1.5 Hypertensive cardiomyopathy, diastolic heart failure, chronic, appears comfortable Questionable pulmonary HTN with prominent V wave in 2016, October on RHC, only trivial MR on echo from 06/2017 with mild TR (RV pressures of 77/21, PA of 77/28 with mean of 49 and a wedge of 27 with prominent V waves with PVR of 3.5) Hx of afib/flutter s/p cardioversion and currently stable on amiodarone HTN,HLP Hx of GI bleed  PLAN/DISCUSSION:  Patient appears to have 2 different types of chest discomfort. One that is exertional that improves with ntg and another that is worse with deep breathing which is unchanged with NTG. Today's presentation is for the latter. EKG shows no acute ST-T changes concerning for ischemia. Trops x 2 have been negative. Would continue to trend troponin's to get a last set. Will obtain another echo to provide a surrogate for the pulmonary pressures. Will also discuss the plan with Dr. [name], however, given the negative, low risk lexiscan not sure if this is related to ischemia(08/2017). Additionally if she has an  atretic LIMA with distal flow provided by other conduits, there may not be much that intervention would be able to provide. However, patient may benefit from a repeat RHC to determine what her filling pressures are. Would forego initiating NTG gtt as it doesn't provide her any relief. If she has true anginal pain, can consider administration of ranexa as another option. Recommend changing the eliquis to heparin gtt while she is in patient.   Will continue the home amiodarone at 200mg , qday (Currently in sinus rhythm), Apixaban 2.5mg , BID, Amlodipine 5mg , Qday, Imdur 60mg , qday, (recently increased on 09/08/2017 in clinic with little benefit) toprol XL 25mg , qday ( with HR's in the 50's, asymptomatic).  Patient appears dry and presents with AKI, would recommend holding today's day of diuretic and is also complaining of some UTI symptoms with malodorous urine. Recommend U/A.  Her back pain appears MSK in etiology and is reproducible.  There dose appear to be a questionably hx of inflammatory polyarthritis with a positive RF and anti-CCP in 2016. Patient presumably saw a rheumatologist, but was told she didn't have anything, but I was unable to find this information. Consider re-ordering inflammatory markers.  Willeen Cass Cardiology fellow

## 2017-09-12 NOTE — ED Notes (Signed)
Given patient [place] sandwich meal in the ED.

## 2017-09-12 NOTE — Progress Notes (Signed)
ANTICOAGULATION CONSULT NOTE - Initial Consult  Pharmacy Consult for Heparin Indication: chest pain/ACS  Allergies  Allergen Reactions  . Atorvastatin Other (See Comments)    Myalgias in legs  . Crestor [Rosuvastatin] Other (See Comments)    Myalgias in legs  . Morphine And Related Other (See Comments)    "Crazy thoughts, severe headache, sick"  . Prednisone     On two occasions has caused A fib   Patient Measurements: Weight: 61.2 kg Height: 5'1" (1.549 m) Heparin Dosing Weight: 60.2  Vital Signs: BP: 137/66 (02/17 1400) Pulse Rate: 62 (02/17 1400)  Labs: Recent Labs    09/11/17 1831 09/12/17 0529 09/12/17 0911 09/12/17 0930  HGB 10.2*  --  10.5*  --   HCT 31.2*  --  31.6*  --   PLT 152  --  152  --   CREATININE 2.00*  --  1.69*  --   TROPONINI  --  <0.03  --  <0.03   Estimated Creatinine Clearance: 20.8 mL/min (A) (by C-G formula based on SCr of 1.69 mg/dL (H)).  Assessment: 53 yoF with h/o afib on Eliquis PTA presents with chest pain. Pharmacy consulted to dose heparin for r/o ACS. Eliquis dose given x1 in ED @ 1215. Will monitor aPTT until appropriate correlation with heparin levels. Hgb 10.5, pltc 152. No bleeding noted.  Goal of Therapy:  Heparin level 0.3-0.7 units/ml Monitor platelets by anticoagulation protocol: Yes   Plan:  Heparin bolus 3500 units IV x1 on 2/18 at 0000 (12 hrs post-Eliquis dose) Start heparin infusion at 750 units/hr following bolus Check heparin level in 8 hours Daily heparin level/aPTT, CBC Monitor for s/sx of bleeding  Erin N. Gerarda Fraction, PharmD PGY1 Pharmacy Resident Pager: [phone] 09/12/2017 2:41 PM

## 2017-09-12 NOTE — H&P (Signed)
[facility] Admission History and Physical Service Pager: [phone]  Patient name: Jacqueline [name] Medical record number: [id number] Date of birth: 12/29/1930 Age: 84 y.o. Gender: female  Primary Care Provider: [name], MD Consultants: Cardiology Code Status: Full   Chief Complaint: chest pain  Assessment and Plan: Jacqueline [name] is a 84 y.o. female presenting with chest pain . PMH is significant for chronic A-fib on anticoagulation, HTN, CKD stage III, GERD, OA, and HFpEF.  Ongoing Chest pain.  In setting of known coronary artery disease s/p CABG. Patient has had progressive worsening of chest pain over the last few months now with persistent substernal chest tightness at rest that radiates to her back and is associated with shortness of breath.  Heart score 6.  Had recent low risk Myoview on 08/31/17 however her last catheterization was 05/09/2015.  EKG shows sinus bradycardia.  Point-of-care troponin negative x2.  Chest x-ray shows pulmonary venous hypertension.  ED provider stated she had talked with cardiology, who had recommended not starting IV heparin at this time.  Does have a history of A. fib/A flutter on Eliquis, metoprolol, amiodarone.  -Admit to SDU, attending Dr. Mingo Amber -Cardiology consulted in the ED, appreciate recommendations -Trend troponins -Continue home metoprolol, amiodarone, imdur - IV heparin gtt while admitted instead of eliquis  AKI on CKD. Cr 2.00 from baseline 1.5, likely 2/2 to medications as imdur recently increased on 09/08/17. - hold home lasix - monitor BMP - avoid nephrotoixc medications  L LE swelling. Unlikely to be DVT as patient has been compliant with anticoagulation/ No homans sign. No erythema. - monitor -consider doppler US if becomes more symptomatic  HTN BP on admission 137/63 - monitor BP - continue home metoprolol, norvasc, imdur  HFpEF, stable. Does not appear to be in acute exacerbation. Last echo  07/22/17 EF 55-60% - continue home meds as per above, hold home lasix for AKI  GERD  - continue home protonix  OA: Chronic. In lower back.  -Continue home tramadol   Neuropathy in feet:  - Continue home Gabapentin and requip  Anxiety - continue home xanax BID prn   FEN/GI: heart healthy Prophylaxis: heparin gtt  Disposition: admit for chest pain  History of Present Illness:  Jacqueline [name] is a 84 y.o. female presenting with chest pain.   Patient states that she has had ongoing chest pain for the last 2-3 months that has progressively worsened.  Previously it was exertional now occurring at rest.  She became highly concerned when she finally tried a nitroglycerin tablet yesterday and it did not bring any relief.  She states that her chest pain is substernal, feels like a tightness, rates 7 or 8 out of 10, radiates to back and right arm, associated with shortness of breath but not diaphoresis or nausea.  Her chest pain continues currently. She states that she has been compliant on her A. fib medications including her Eliquis and metoprolol.  Denies fever chills, cough, wheezing, jaw pain, nausea, vomiting.   Review Of Systems: Per HPI with the following additions:   Review of Systems  Constitutional: Negative for chills and fever.  Respiratory: Positive for shortness of breath. Negative for cough and wheezing.   Cardiovascular: Positive for chest pain and leg swelling (L leg). Negative for palpitations.  Gastrointestinal: Negative for abdominal pain, constipation, diarrhea, heartburn, nausea and vomiting.  Genitourinary: Negative for dysuria, frequency and urgency.  Musculoskeletal: Positive for back pain.  Skin: Negative for rash.  Neurological:  Negative for sensory change, focal weakness and headaches.    Patient Active Problem List   Diagnosis Date Noted  . Atherosclerosis of abdominal aorta ([place]) 07/22/2017  . Elevated TSH 07/22/2017  . Anginal equivalent ([place])   . Long  term current use of anticoagulant 07/08/2017  . Chest pain with moderate risk of acute coronary syndrome   . Hereditary and idiopathic peripheral neuropathy 12/12/2015  . Chronic diastolic CHF (congestive heart failure) ([place]) 06/05/2015  . Pulmonary arterial hypertension ([place]) 05/17/2015  . Anemia of chronic disease   . Tachy-brady syndrome ([place]) 05/06/2015  . Hx of CABG 05/06/2015  . Anemia due to chronic blood loss 10/10/2014  . S/P knee replacement 10/08/2014  . Osteoporosis 03/22/2014  . PAF (paroxysmal atrial fibrillation) ([place]) 02/27/2014  . Hypertension, uncontrolled   . GERD (gastroesophageal reflux disease)   . CKD (chronic kidney disease) stage 3, GFR 30-59 ml/min (HCC) 12/22/2013  . Dyslipidemia 09/23/2006  . Hypertensive cardiomyopathy, without heart failure ([place]) 09/23/2006  . RHINITIS, ALLERGIC 09/23/2006  . REFLUX ESOPHAGITIS 09/23/2006  . DIVERTICULOSIS OF COLON 09/23/2006  . BACK PAIN, LOW 09/23/2006  . INSOMNIA NOS 09/23/2006    Past Medical History: Past Medical History:  Diagnosis Date  . Arthritis    back  . Atrial fibrillation with rapid ventricular response ([place]) 02/2014   a. CHA2DS2VASc = 6 -> on eliquis;  b. 02/2014 s/p DCCV;  c. 08/2014 Echo: EF 55-60%, Gr 2 DD, mild MR, triv AI.  Marland Kitchen CAD (coronary artery disease)    a. s/p CABG x 2 (LIMA->LAD, VG->Diag);  b. 08/2014 Cath: LM nl, LAD 90p, LCX nl, RCA nl/dominant, LIMA->LAD atretic, VG->D2 patent w/ retrograde filling of LAD, EF 55-60%-->Med Rx.  . Diverticulitis 02/21/2012  . GERD (gastroesophageal reflux disease)   . Hyperlipemia   . Hypertension   . Insomnia     Past Surgical History: Past Surgical History:  Procedure Laterality Date  . ABDOMINAL HYSTERECTOMY  1975  . CARDIAC CATHETERIZATION N/A 05/09/2015   Procedure: Right Heart Cath;  Surgeon: Larey Dresser, MD;  Location: [facility];  Service: Cardiovascular;  Laterality: N/A;  . CARDIOVERSION N/A 02/28/2014   Procedure: CARDIOVERSION;   Surgeon: Sanda Klein, MD;  Location: [facility];  Service: Cardiovascular;  Laterality: N/A;  . CATARACT EXTRACTION Bilateral 2014   with lens implanted  . COLONOSCOPY N/A 02/19/2015   Procedure: COLONOSCOPY;  Surgeon: Ladene Artist, MD;  Location: [facility] ENDOSCOPY;  Service: Endoscopy;  Laterality: N/A;  . CORONARY ARTERY BYPASS GRAFT  2006   off-pump bypass surgery with LIMA to the LAD and SVG to the second diagonal artery ( performed by Dr Servando Snare)   . JOINT REPLACEMENT Bilateral    L-2004, R-2006  . LEFT HEART CATHETERIZATION WITH CORONARY/GRAFT ANGIOGRAM N/A 09/17/2014   Procedure: LEFT HEART CATHETERIZATION WITH Beatrix Fetters;  Surgeon: Troy Sine, MD;  Location: [facility] CATH LAB;  Service: Cardiovascular;  Laterality: N/A;  . REIMPLANTATION OF TOTAL KNEE Right 10/08/2014   Procedure: REIMPLANTATION OF RIGHT TOTAL KNEE ARTHROPLASTY WITH REMOVAL OF ANTIBIOTIC SPACER;  Surgeon: Paralee Cancel, MD;  Location: [facility] ORS;  Service: Orthopedics;  Laterality: Right;  . ROTATOR CUFF REPAIR Bilateral    r-1999, l- 2005  . TEE WITHOUT CARDIOVERSION N/A 02/28/2014   Procedure: TRANSESOPHAGEAL ECHOCARDIOGRAM (TEE);  Surgeon: Sanda Klein, MD;  Location: [facility] ENDOSCOPY;  Service: Cardiovascular;  Laterality: N/A;  . TOTAL KNEE ARTHROPLASTY Right 07/02/2014   Procedure: Resection of Infected Right Total Knee Arthroplasty with placement antibiotic  spacer;  Surgeon: Mauri Pole, MD;  Location: [facility] ORS;  Service: Orthopedics;  Laterality: Right;    Social History: Social History   Tobacco Use  . Smoking status: Never Smoker  . Smokeless tobacco: Never Used  Substance Use Topics  . Alcohol use: No  . Drug use: No   Additional social history: Lives at home with granddaughter.  Please also refer to relevant sections of EMR.  Family History: Family History  Problem Relation Age of Onset  . Parkinson's disease Mother   . Heart attack Brother   . Arthritis Brother   . Cancer Brother         Unknown  . Breast cancer Neg Hx     Allergies and Medications: Allergies  Allergen Reactions  . Atorvastatin Other (See Comments)    Myalgias in legs  . Crestor [Rosuvastatin] Other (See Comments)    Myalgias in legs  . Morphine And Related Other (See Comments)    "Crazy thoughts, severe headache, sick"  . Prednisone     On two occasions has caused A fib   No current facility-administered medications on file prior to encounter.    Current Outpatient Medications on File Prior to Encounter  Medication Sig Dispense Refill  . acetaminophen (TYLENOL) 500 MG tablet Take 500-1,000 mg by mouth every 6 (six) hours as needed for moderate pain or headache.    . ALPRAZolam (XANAX) 0.5 MG tablet TAKE 1 TABLET BY MOUTH TWICE A DAY AS NEEDED FOR ANXIETY (Patient taking differently: TAKE 0.5 MG BY MOUTH AT BEDTIME AND 0.25 MG BY MOUTH DAILY  AS NEEDED FOR ANXIETY) 60 tablet 5  . amiodarone (PACERONE) 200 MG tablet Take 1 tablet (200 mg total) by mouth daily. 90 tablet 3  . amLODipine (NORVASC) 5 MG tablet Take 1 tablet (5 mg total) by mouth daily. 180 tablet 3  . apixaban (ELIQUIS) 2.5 MG TABS tablet Take 1 tablet (2.5 mg total) 2 (two) times daily by mouth. 28 tablet 0  . furosemide (LASIX) 20 MG tablet TAKE 1 TABLET BY MOUTH EVERY DAY 90 tablet 3  . gabapentin (NEURONTIN) 100 MG capsule TAKE 1 CAPSULE (100 MG TOTAL) BY MOUTH 3 (THREE) TIMES DAILY. 270 capsule 3  . Iron-Vitamins (GERITOL COMPLETE PO) Take 1 tablet by mouth daily.    . isosorbide mononitrate (IMDUR) 60 MG 24 hr tablet TAKE 1 TABLET BY MOUTH EVERY DAY 30 tablet 6  . metoprolol succinate (TOPROL-XL) 25 MG 24 hr tablet Take 1 tablet (25 mg total) by mouth daily. 90 tablet 3  . nitroGLYCERIN (NITROSTAT) 0.4 MG SL tablet PLACE 1 TABLET (0.4 MG TOTAL) UNDER THE TONGUE EVERY 5 (FIVE) MINUTES AS NEEDED FOR CHEST PAIN. X 3 doses 25 tablet 1  . Omega-3 Fatty Acids (FISH OIL) 1000 MG CAPS Take 1-2 capsules by mouth See admin instructions.  Takes one in the morning and two capsules at night.    Marland Kitchen omeprazole (PRILOSEC) 20 MG capsule TAKE ONE CAPSULE BY MOUTH EVERY DAY 90 capsule 3  . polyethylene glycol powder (GLYCOLAX/MIRALAX) powder MIX 17 G AND TAKE BY MOUTH DAILY. (Patient taking differently: MIX 17 G AND TAKE BY MOUTH DAILY AS NEEDED FOR CONSTIPATION) 527 g 12  . rOPINIRole (REQUIP) 0.25 MG tablet TAKE 1 TABLET BY MOUTH 3 TIMES A DAY (Patient taking differently: TAKE 0.25MG  BY MOUTH ONCE DAILY) 270 tablet 3  . simvastatin (ZOCOR) 20 MG tablet Take 1 tablet (20 mg total) by mouth at bedtime. 90 tablet 3  .  traMADol (ULTRAM) 50 MG tablet TAKE 1 TABLET BY MOUTH TWICE A DAY AS NEEDED FOR PAIN 60 tablet 4  . traZODone (DESYREL) 100 MG tablet TAKE 1 TABLET (100 MG TOTAL) BY MOUTH AT BEDTIME. 90 tablet 3    Objective: BP 127/66   Pulse (!) 50   Temp 98.4 F (36.9 C) (Oral)   Resp 19   SpO2 97%  Exam: General: Laying in bed, in no acute distress Eyes: Right eye inner corner with subconjunctival hemorrhage.  PERRLA, EOMI ENTM: Moist mucous membranes, oropharynx nonerythematous Neck: Supple, no lymphadenopathy Cardiovascular: Bradycardic, S1 and S2, no murmurs Respiratory: Clear to auscultation bilaterally, normal work of breathing on room air Gastrointestinal: Soft, nontender, nondistended, positive bowel sounds MSK: Left calf circumference greater than right, no pitting edema, no erythema or warmth, and negative Homans. Derm: Warm and dry, no rashes Neuro: Alert and awake, grossly normal Psych: Appropriate affect  Labs and Imaging: CBC BMET  Recent Labs  Lab 09/11/17 1831  WBC 3.8*  HGB 10.2*  HCT 31.2*  PLT 152   Recent Labs  Lab 09/11/17 1831  NA 139  K 4.0  CL 106  CO2 23  BUN 22*  CREATININE 2.00*  GLUCOSE 127*  CALCIUM 9.0     Troponin (Point of Care Test) Recent Labs    09/12/17 0052  TROPIPOC 0.00     Dg Chest 2 View  Result Date: 09/11/2017 CLINICAL DATA:  84 y/o F; chest pressure  radiating to the back. Shortness of breath. EXAM: CHEST  2 VIEW COMPARISON:  07/21/2017 chest radiograph FINDINGS: Normal cardiac silhouette. Aortic atherosclerosis with calcification. Post median sternotomy with wires in alignment. Pulmonary venous hypertension. No consolidation. No pleural effusion or pneumothorax. Right shoulder rotator cuff repair anchors noted. No acute osseous abnormality identified. IMPRESSION: Pulmonary venous hypertension. Stable cardiac silhouette. No consolidation or effusion. Electronically Signed   By: Kristine Garbe M.D.   On: 09/11/2017 19:51    Bufford Lope, DO 09/12/2017, 4:42 AM PGY-2, [facility] pager: [phone], text pages welcome

## 2017-09-12 NOTE — ED Notes (Signed)
Admitting Provider at bedside. 

## 2017-09-12 NOTE — Progress Notes (Signed)
Chart reviewed - admitted by fellow early this morning. Well known patient with recurrent chest pain. Recent negative myoview - discussion about possible repeat catheterization, although not clear what interventional options are present. Troponins are negative. Creatinine was elevated to 2.0, but improving. Baseline creatinine 1.4?? Will likely need cath this coming week.   Pixie Casino, MD, [facility], [place] Director of the Advanced Lipid Disorders &  Cardiovascular Risk Reduction Clinic Diplomate of the American Board of Clinical Lipidology Attending Cardiologist  Direct Dial: [phone]  Fax: [phone]  Website:  [email or web address]

## 2017-09-12 NOTE — ED Notes (Signed)
Admit Doctor at bedside.  

## 2017-09-12 NOTE — ED Notes (Signed)
ED Provider at bedside. 

## 2017-09-13 ENCOUNTER — Other Ambulatory Visit (HOSPITAL_COMMUNITY): Payer: PPO

## 2017-09-13 DIAGNOSIS — N183 Chronic kidney disease, stage 3 (moderate): Secondary | ICD-10-CM | POA: Diagnosis not present

## 2017-09-13 DIAGNOSIS — I10 Essential (primary) hypertension: Secondary | ICD-10-CM | POA: Diagnosis not present

## 2017-09-13 DIAGNOSIS — R079 Chest pain, unspecified: Secondary | ICD-10-CM

## 2017-09-13 LAB — BASIC METABOLIC PANEL
ANION GAP: 10 (ref 5–15)
BUN: 16 mg/dL (ref 6–20)
CALCIUM: 9.2 mg/dL (ref 8.9–10.3)
CO2: 24 mmol/L (ref 22–32)
Chloride: 109 mmol/L (ref 101–111)
Creatinine, Ser: 1.65 mg/dL — ABNORMAL HIGH (ref 0.44–1.00)
GFR calc Af Amer: 32 mL/min — ABNORMAL LOW (ref >60)
GFR, EST NON AFRICAN AMERICAN: 27 mL/min — AB (ref >60)
Glucose, Bld: 94 mg/dL (ref 65–99)
Potassium: 4 mmol/L (ref 3.5–5.1)
Sodium: 143 mmol/L (ref 135–145)

## 2017-09-13 LAB — APTT: APTT: 143 s — AB (ref 24–36)

## 2017-09-13 LAB — HEPARIN LEVEL (UNFRACTIONATED): Heparin Unfractionated: 2.04 IU/mL — ABNORMAL HIGH (ref 0.30–0.70)

## 2017-09-13 MED ORDER — APIXABAN 2.5 MG PO TABS
2.5000 mg | ORAL_TABLET | Freq: Two times a day (BID) | ORAL | Status: DC
  Administered 2017-09-13: 2.5 mg via ORAL
  Filled 2017-09-13: qty 1

## 2017-09-13 MED ORDER — HEPARIN (PORCINE) IN NACL 100-0.45 UNIT/ML-% IJ SOLN: 600.0000 [IU]/h | INTRAMUSCULAR | Status: DC

## 2017-09-13 NOTE — Progress Notes (Signed)
Patient received discharge information and acknowledged understanding of it. Patient IV was removed.  

## 2017-09-13 NOTE — Progress Notes (Signed)
Progress Note  Patient Name: Jacqueline [name] Date of Encounter: 09/13/2017  Primary Cardiologist: Cro[place]  Subjective   Still with atypical "pressure" in chest   Inpatient Medications    Scheduled Meds: . amiodarone  200 mg Oral Daily  . amLODipine  5 mg Oral Daily  . gabapentin  100 mg Oral TID  . isosorbide mononitrate  60 mg Oral Daily  . metoprolol succinate  12.5 mg Oral Daily  . multivitamin with minerals  1 tablet Oral Daily  . pantoprazole  40 mg Oral Daily  . rOPINIRole  0.25 mg Oral TID  . simvastatin  20 mg Oral QHS  . traZODone  100 mg Oral QHS   Continuous Infusions: . heparin     PRN Meds: acetaminophen, ALPRAZolam, loperamide, nitroGLYCERIN, ondansetron (ZOFRAN) IV, traMADol   Vital Signs    Vitals:   09/12/17 1510 09/12/17 1947 09/13/17 0008 09/13/17 0548  BP: (!) 133/56 (!) 112/52 (!) 124/50 (!) 145/61  Pulse:  (!) 58 (!) 59 60  Resp:  19 15 15   Temp: 98.1 F (36.7 C) 98.7 F (37.1 C) 98 F (36.7 C) 98.5 F (36.9 C)  TempSrc: Oral Oral Axillary Oral  SpO2: 96% 95% 95% 99%  Weight: 133 lb (60.3 kg)   132 lb (59.9 kg)  Height: 5\' 1"  (1.549 m)       Intake/Output Summary (Last 24 hours) at 09/13/2017 0837 Last data filed at 09/13/2017 5701 Gross per 24 hour  Intake 300.88 ml  Output 300 ml  Net 0.88 ml   Filed Weights   09/12/17 1510 09/13/17 0548  Weight: 133 lb (60.3 kg) 132 lb (59.9 kg)    Telemetry    NSR 09/13/2017  - Personally Reviewed  ECG    SR no acute ECG changes  - Personally Reviewed  Physical Exam  Elderly white female  GEN: No acute distress.   Neck: No JVD Cardiac: RRR, no murmurs, rubs, or gallops.  Respiratory: Clear to auscultation bilaterally. GI: Soft, nontender, non-distended  MS: No edema; No deformity. Neuro:  Nonfocal  Psych: Normal affect   Labs    Chemistry Recent Labs  Lab 09/11/17 1831 09/12/17 0911 09/13/17 0627  NA 139 142 143  K 4.0 4.3 4.0  CL 106 107 109  CO2 23 25 24   GLUCOSE  127* 93 94  BUN 22* 20 16  CREATININE 2.00* 1.69* 1.65*  CALCIUM 9.0 9.3 9.2  GFRNONAA 22* 27* 27*  GFRAA 25* 31* 32*  ANIONGAP 10 10 10      Hematology Recent Labs  Lab 09/11/17 1831 09/12/17 0911  WBC 3.8* 3.4*  RBC 3.30* 3.35*  HGB 10.2* 10.5*  HCT 31.2* 31.6*  MCV 94.5 94.3  MCH 30.9 31.3  MCHC 32.7 33.2  RDW 14.3 14.2  PLT 152 152    Cardiac Enzymes Recent Labs  Lab 09/12/17 0529 09/12/17 0930 09/12/17 1628  TROPONINI <0.03 <0.03 <0.03    Recent Labs  Lab 09/11/17 1917 09/12/17 0052  TROPIPOC 0.00 0.00     BNPNo results for input(s): BNP, PROBNP in the last 168 hours.   DDimer No results for input(s): DDIMER in the last 168 hours.   Radiology    Dg Chest 2 View  Result Date: 09/11/2017 CLINICAL DATA:  84 y/o F; chest pressure radiating to the back. Shortness of breath. EXAM: CHEST  2 VIEW COMPARISON:  07/21/2017 chest radiograph FINDINGS: Normal cardiac silhouette. Aortic atherosclerosis with calcification. Post median sternotomy with wires in alignment. Pulmonary venous  hypertension. No consolidation. No pleural effusion or pneumothorax. Right shoulder rotator cuff repair anchors noted. No acute osseous abnormality identified. IMPRESSION: Pulmonary venous hypertension. Stable cardiac silhouette. No consolidation or effusion. Electronically Signed   By: Kristine Garbe M.D.   On: 09/11/2017 19:51    Cardiac Studies   Myovue 08/31/17 normal no ischemia   Patient Profile     84 y.o. female admitted with atypical chest pain R/O no ECG changes and had normal myovue 09/01/27 History of CABG with cath 2016 atretic LIMA other conduits normal   Assessment & Plan    1. Chest pain atypical r/o no ecg changes and recent non ischemic myovue 09/04/17  Ok to d/c home medical Rx no indication for cath will arrange [facility] outpatient f/u with dr [name] 2. PAF continue amiodarone and low dose eliquis  3. UTI per primary service malodorous urine but UA appears benign    Will sign off   For questions or updates, please contact [place] Please consult [email or web address] for contact info under Cardiology/STEMI.      Signed, [name], MD  09/13/2017, 8:37 AM

## 2017-09-13 NOTE — Care Management Obs Status (Signed)
[facility] NOTIFICATION   Patient Details  Name: NETTYE FLEGAL MRN: [id number] Date of Birth: 1930/09/11   Medicare Observation Status Notification Given:  Yes    Bethena Roys, RN 09/13/2017, 10:05 AM

## 2017-09-13 NOTE — Progress Notes (Signed)
   09/13/17 1400  Clinical Encounter Type  Visited With Patient  Visit Type Initial  Referral From Chaplain  Consult/Referral To Chaplain  Spiritual Encounters  Spiritual Needs Prayer;Emotional  Stress Factors  Patient Stress Factors Exhausted  Family Stress Factors None identified    Ppt was in her bed awake. No family on-site but pt talked about her son who will be coming to take her home after discharge today. Patient talked about a number of concerns for her grand daughter who is going through divorce. Pt was very receptive and appreciative of chaplain's visit. Chaplain provided emotional support through reflective listening, compassionate presence and prayer.  Zohar Maroney a Medical sales representative, [employer]

## 2017-09-13 NOTE — Progress Notes (Addendum)
Occupational Therapy Treatment Patient Details Name: Jacqueline [name] MRN: [id number] DOB: [birth year] Today's Date: 09/13/2017    History of present illness Jacqueline [name] Jacqueline 84 y.o.femalepresenting with chest pain. PMH is significant for chronic Jacqueline-fib on anticoagulation, HTN, CKD stage III, GERD, OA, and HFpEF.   OT comments  Patient evaluated by Occupational Therapy with no further acute OT needs identified. She is able to complete ADL at modified independent baseline today. All education has been completed and the patient has no further questions. See below for any follow-up Occupational Therapy or equipment needs. OT to sign off. Thank you for referral.    Follow Up Recommendations  No OT follow up;Supervision - Intermittent    Equipment Recommendations  None recommended by OT    Recommendations for Other Services      Precautions / Restrictions Precautions Precautions: None Restrictions Weight Bearing Restrictions: No       Mobility Bed Mobility Overal bed mobility: Independent                Transfers Overall transfer level: Independent                    Balance Overall balance assessment: Needs assistance Sitting-balance support: No upper extremity supported Sitting balance-Leahy Scale: Normal     Standing balance support: No upper extremity supported Standing balance-Leahy Scale: Good                             ADL either performed or assessed with clinical judgement   ADL Overall ADL's : Modified independent;At baseline                                             Vision Baseline Vision/History: Wears glasses Wears Glasses: At all times Patient Visual Report: No change from baseline Vision Assessment?: No apparent visual deficits   Perception     Praxis      Cognition Arousal/Alertness: Awake/alert Behavior During Therapy: WFL for tasks assessed/performed Overall Cognitive Status: Within  Functional Limits for tasks assessed                                          Exercises     Shoulder Instructions       General Comments VSS. Pt does report some chest pressure.     Pertinent Vitals/ Pain       Pain Assessment: 0-10 Pain Score: 5  Faces Pain Scale: Hurts Jacqueline little bit Pain Location: chest pressure Pain Descriptors / Indicators: Pressure Pain Intervention(s): Monitored during session  Home Living Family/patient expects to be discharged to:: Private residence Living Arrangements: Other relatives(granddaughter) Available Help at Discharge: Family;Available PRN/intermittently Type of Home: House Home Access: Stairs to enter [employer] of Steps: 2 Entrance Stairs-Rails: Right;Left Home Layout: One level     Bathroom Shower/Tub: Teacher, early years/pre: Handicapped height     Home Equipment: Environmental consultant - 2 wheels;Cane - single point;Bedside commode          Prior Functioning/Environment Level of Independence: Independent            Frequency           Progress Toward Goals  OT Goals(current goals can now  be found in the care plan section)     Acute Rehab OT Goals Patient Stated Goal: no pain OT Goal Formulation: With patient  Plan      Co-evaluation                 AM-PAC PT "6 Clicks" Daily Activity     Outcome Measure   Help from another person eating meals?: None Help from another person taking care of personal grooming?: None Help from another person toileting, which includes using toliet, bedpan, or urinal?: None Help from another person bathing (including washing, rinsing, drying)?: None Help from another person to put on and taking off regular upper body clothing?: None Help from another person to put on and taking off regular lower body clothing?: None 6 Click Score: 24    End of Session    OT Visit Diagnosis: Other abnormalities of gait and mobility (R26.89)   Activity  Tolerance Patient tolerated treatment well   Patient Left in chair;with call bell/phone within reach   Nurse Communication Mobility status        Time: 1206-1217 OT Time Calculation (min): 11 min  Charges: OT General Charges $OT Visit: 1 Visit OT Evaluation $OT Eval Low Complexity: 1 Low  Jacqueline Herrlich, MS OTR/L  Pager: [place] Jacqueline [name] 09/13/2017, 1:17 PM

## 2017-09-13 NOTE — Evaluation (Signed)
Physical Therapy Evaluation Patient Details Name: Jacqueline [name] MRN: [id number] DOB: 25-May-1931 Today's Date: 09/13/2017   History of Present Illness  Jacqueline [name] a 84 y.o.femalepresenting with chest pain. PMH is significant for chronic A-fib on anticoagulation, HTN, CKD stage III, GERD, OA, and HFpEF.  Clinical Impression  Pt is at or close to baseline functioning and should be safe at home with available family assist.  She is still concerned about her chest pressure. There are no further acute PT needs.  Will sign off at this time.     Follow Up Recommendations No PT follow up    Equipment Recommendations  None recommended by PT    Recommendations for Other Services       Precautions / Restrictions Precautions Precautions: None Restrictions Weight Bearing Restrictions: No      Mobility  Bed Mobility Overal bed mobility: Independent                Transfers Overall transfer level: Independent                  Ambulation/Gait Ambulation/Gait assistance: Independent Ambulation Distance (Feet): 300 Feet Assistive device: None Gait Pattern/deviations: Step-through pattern Gait velocity: moderate Gait velocity interpretation: at or above normal speed for age/gender General Gait Details: steady gait, able to scan her environment, step over obstacles  Stairs            Wheelchair Mobility    Modified Rankin (Stroke Patients Only)       Balance Overall balance assessment: Needs assistance Sitting-balance support: No upper extremity supported Sitting balance-Leahy Scale: Normal     Standing balance support: No upper extremity supported Standing balance-Leahy Scale: Good                               Pertinent Vitals/Pain Pain Assessment: Faces Faces Pain Scale: Hurts a little bit Pain Location: chest Pain Descriptors / Indicators: Pressure Pain Intervention(s): Monitored during session    Home Living  Family/patient expects to be discharged to:: Private residence Living Arrangements: Other relatives Available Help at Discharge: Family;Available PRN/intermittently Type of Home: House Home Access: Stairs to enter Entrance Stairs-Rails: Psychiatric nurse of Steps: 2 Home Layout: One level Home Equipment: Walker - 2 wheels;Cane - single point;Bedside commode      Prior Function Level of Independence: Independent               Hand Dominance        Extremity/Trunk Assessment   Upper Extremity Assessment Upper Extremity Assessment: Overall WFL for tasks assessed    Lower Extremity Assessment Lower Extremity Assessment: Overall WFL for tasks assessed       Communication   Communication: No difficulties  Cognition Arousal/Alertness: Awake/alert Behavior During Therapy: WFL for tasks assessed/performed Overall Cognitive Status: Within Functional Limits for tasks assessed                                        General Comments General comments (skin integrity, edema, etc.): still reports some chest pressure    Exercises     Assessment/Plan    PT Assessment Patent does not need any further PT services  PT Problem List         PT Treatment Interventions      PT Goals (Current goals can be found in the Care Plan  section)  Acute Rehab PT Goals PT Goal Formulation: All assessment and education complete, DC therapy    Frequency     Barriers to discharge        Co-evaluation               AM-PAC PT "6 Clicks" Daily Activity  Outcome Measure Difficulty turning over in bed (including adjusting bedclothes, sheets and blankets)?: None Difficulty moving from lying on back to sitting on the side of the bed? : None Difficulty sitting down on and standing up from a chair with arms (e.g., wheelchair, bedside commode, etc,.)?: None Help needed moving to and from a bed to chair (including a wheelchair)?: None Help needed walking  in hospital room?: None Help needed climbing 3-5 steps with a railing? : None 6 Click Score: 24    End of Session   Activity Tolerance: Patient tolerated treatment well;Other (comment)(noticeable fatigue) Patient left: in bed;with call bell/phone within reach Nurse Communication: Mobility status PT Visit Diagnosis: Other abnormalities of gait and mobility (R26.89)    Time: 7017-7939 PT Time Calculation (min) (ACUTE ONLY): 16 min   Charges:   PT Evaluation $PT Eval Low Complexity: 1 Low     PT G Codes:        Sep 16, 2017  Jacqueline [name], PT [phone]  (pager)  Jacqueline [name] Jacqueline [name] 16-Sep-2017, 11:19 AM

## 2017-09-13 NOTE — Progress Notes (Signed)
Family Medicine Teaching Service Daily Progress Note Intern Pager: [phone]  Patient name: Jacqueline [name] Medical record number: [id number] Date of birth: [birth year] Age: 84 y.o. Gender: female  Primary Care Provider: [name], MD Consultants: Cardiology Code Status: Full  Pt Overview and Major Events to Date:  Jacqueline [name] is a 84 y.o. female presenting with chest pain . PMH is significant for chronic A-fib on anticoagulation, HTN, CKD stage III, GERD, OA, and HFpEF.  Assessment and Plan:  Chest pain  Negative troponins x3.  Planned cardiac catheterization as outpatient sometime this week to rule out ischemia. Appreciate cardiology recommendations -Continue metoprolol, amlodipine, Imdur -Cardiac monitoring  AKI, Improved Cr 2.00 > 1.69. Baseline 1.5 -Hold Lasix -Monitor creatinine -Avoid nephrotoxic agents  HTN BP on admission 137/63 - monitor BP - continue home metoprolol, norvasc, imdur  FEN/GI:  PPx: Heparin  Disposition: discharge home today  Subjective:  Feels chest "tightness" and some shortness of breath. No GI or abdominal pain.   Objective: Temp:  [98 F (36.7 C)-98.7 F (37.1 C)] 98.5 F (36.9 C) (02/18 0548) Pulse Rate:  [52-62] 60 (02/18 0548) Resp:  [15-19] 15 (02/18 0548) BP: (112-145)/(50-66) 145/61 (02/18 0548) SpO2:  [93 %-100 %] 99 % (02/18 0548) Weight:  [132 lb (59.9 kg)-133 lb (60.3 kg)] 132 lb (59.9 kg) (02/18 0548) Physical Exam: General: NAd, Sitting up in bed Cardiovascular: RRR, no mrg Respiratory: CTAB, normal work of breathing Abdomen: soft, nontender, nondistended Extremities: 2+ dp, no lower extremity edema  Laboratory: Recent Labs  Lab 09/11/17 1831 09/12/17 0911  WBC 3.8* 3.4*  HGB 10.2* 10.5*  HCT 31.2* 31.6*  PLT 152 152   Recent Labs  Lab 09/11/17 1831 09/12/17 0911  NA 139 142  K 4.0 4.3  CL 106 107  CO2 23 25  BUN 22* 20  CREATININE 2.00* 1.69*  CALCIUM 9.0 9.3  GLUCOSE 127* 93       Imaging/Diagnostic Tests: No results found.  Bonnita Hollow, MD 09/13/2017, 7:04 AM PGY-1, [place] Intern pager: [phone], text pages welcome

## 2017-09-13 NOTE — Care Management CC44 (Signed)
Condition Code 44 Documentation Completed  Patient Details  Name: ARIONNE IAMS MRN: [id number] Date of Birth: [birth year]   Condition Code 44 given:  Yes Patient signature on Condition Code 44 notice:  Yes Documentation of 2 MD's agreement:  Yes Code 44 added to claim:  Yes    Bethena Roys, RN 09/13/2017, 10:05 AM

## 2017-09-13 NOTE — Progress Notes (Signed)
[facility] for Heparin Indication: chest pain/ACS  Allergies  Allergen Reactions  . Atorvastatin Other (See Comments)    Myalgias in legs  . Crestor [Rosuvastatin] Other (See Comments)    Myalgias in legs  . Morphine And Related Other (See Comments)    "Crazy thoughts, severe headache, sick"  . Prednisone     On two occasions has caused A fib   Patient Measurements: Weight: 61.2 kg Height: 5'1" (1.549 m) Heparin Dosing Weight: 60.2  Vital Signs: Temp: 98.5 F (36.9 C) (02/18 0548) Temp Source: Oral (02/18 0548) BP: 145/61 (02/18 0548) Pulse Rate: 60 (02/18 0548)  Labs: Recent Labs    09/11/17 1831 09/12/17 0529 09/12/17 0911 09/12/17 0930 09/12/17 1628 09/13/17 0627  HGB 10.2*  --  10.5*  --   --   --   HCT 31.2*  --  31.6*  --   --   --   PLT 152  --  152  --   --   --   APTT  --   --   --   --   --  143*  HEPARINUNFRC  --   --   --   --   --  >2.04*  CREATININE 2.00*  --  1.69*  --   --  1.65*  TROPONINI  --  <0.03  --  <0.03 <0.03  --    Estimated Creatinine Clearance: 21.1 mL/min (A) (by C-G formula based on SCr of 1.65 mg/dL (H)).  Assessment: 22 yoF with h/o afib on Eliquis PTA presents with chest pain. Pharmacy consulted to dose heparin for r/o ACS. Eliquis dose given x1 in ED @ 1215. Will monitor aPTT until appropriate correlation with heparin levels. Hgb 10.5, pltc 152. No bleeding noted.  PTT = 143 seconds  Goal of Therapy:  Heparin level 0.3-0.7 units/ml Monitor platelets by anticoagulation protocol: Yes  PTT = 66 to 102 seconds   Plan:  Hold heparin x 1 hour Resume heparin at 600 units / hr 6 hour PTT  Thank you Anette Guarneri, PharmD [phone] 09/13/2017 8:31 AM

## 2017-09-13 NOTE — Discharge Summary (Signed)
[facility] Discharge Summary  Patient name: Jacqueline [name] Medical record number: [id number] Date of birth: 1931-05-16 Age: 84 y.o. Gender: female Date of Admission: 09/12/2017  Date of Discharge: 09/13/2017 Admitting Physician: Alveda Reasons, MD  Primary Care Provider: [name], MD Consultants: Cardiology  Indication for Hospitalization: Chest pain rule out  Discharge Diagnoses/Problem List:  Chronic atrial fibrillation on anticoagulation HTN CKD stage III GERD OA HFpEF  Disposition: Home  Discharge Condition: Improved  Discharge Exam:  General: NAd, Sitting up in bed Cardiovascular: RRR, no mrg Respiratory: CTAB, normal work of breathing Abdomen: soft, nontender, nondistended Extremities: 2+ dp, no lower extremity edema  Brief Hospital Course:  Jacqueline [name] is a 84 y.o. female who presented with worsening chest pain over the past several months.  Given her known coronary artery disease, patient was admitted for chest pain rule out.  EKG showed sinus bradycardia.  Her troponins were negative x3.  She was evaluated by cardiology who did not recommend any further management.  Patient has known stressors at home concerning her daughter's recent divorce.  It is felt that this chest pain may have an anxiety component to it as well.  Issues for Follow Up:  1. Patient endorsed shortness of breath during stay.  Did not require any oxygen.  Consider outpatient PFT given patient is on amiodarone  2. Patient had malodorous urine.  Denied any urinary symptoms.  Did not have any signs of active urinary infection.  Consider UA to evaluate for possible UTI.   Significant Procedures: None  Significant Labs and Imaging:  Recent Labs  Lab 09/11/17 1831 09/12/17 0911  WBC 3.8* 3.4*  HGB 10.2* 10.5*  HCT 31.2* 31.6*  PLT 152 152   Recent Labs  Lab 09/11/17 1831 09/12/17 0911 09/13/17 0627  NA 139 142 143  K 4.0 4.3 4.0  CL 106 107 109   CO2 23 25 24   GLUCOSE 127* 93 94  BUN 22* 20 16  CREATININE 2.00* 1.69* 1.65*  CALCIUM 9.0 9.3 9.2    Results/Tests Pending at Time of Discharge:   Discharge Medications:  Allergies as of 09/13/2017      Reactions   Atorvastatin Other (See Comments)   Myalgias in legs   Crestor [rosuvastatin] Other (See Comments)   Myalgias in legs   Morphine And Related Other (See Comments)   "Crazy thoughts, severe headache, sick"   Prednisone    On two occasions has caused A fib      Medication List    TAKE these medications   acetaminophen 500 MG tablet Commonly known as:  TYLENOL Take 500-1,000 mg by mouth every 6 (six) hours as needed for moderate pain or headache.   ALPRAZolam 0.5 MG tablet Commonly known as:  XANAX TAKE 1 TABLET BY MOUTH TWICE A DAY AS NEEDED FOR ANXIETY What changed:  See the new instructions.   amiodarone 200 MG tablet Commonly known as:  PACERONE Take 1 tablet (200 mg total) by mouth daily.   amLODipine 5 MG tablet Commonly known as:  NORVASC Take 1 tablet (5 mg total) by mouth daily.   apixaban 2.5 MG Tabs tablet Commonly known as:  ELIQUIS Take 1 tablet (2.5 mg total) 2 (two) times daily by mouth.   Fish Oil 1000 MG Caps Take 1-2 capsules by mouth See admin instructions. Takes one in the morning and two capsules at night.   furosemide 20 MG tablet Commonly known as:  LASIX TAKE 1 TABLET  BY MOUTH EVERY DAY   gabapentin 100 MG capsule Commonly known as:  NEURONTIN TAKE 1 CAPSULE (100 MG TOTAL) BY MOUTH 3 (THREE) TIMES DAILY.   GERITOL COMPLETE PO Take 1 tablet by mouth daily.   isosorbide mononitrate 60 MG 24 hr tablet Commonly known as:  IMDUR TAKE 1 TABLET BY MOUTH EVERY DAY   metoprolol succinate 25 MG 24 hr tablet Commonly known as:  TOPROL-XL Take 1 tablet (25 mg total) by mouth daily.   nitroGLYCERIN 0.4 MG SL tablet Commonly known as:  NITROSTAT PLACE 1 TABLET (0.4 MG TOTAL) UNDER THE TONGUE EVERY 5 (FIVE) MINUTES AS NEEDED FOR  CHEST PAIN. X 3 doses   omeprazole 20 MG capsule Commonly known as:  PRILOSEC TAKE ONE CAPSULE BY MOUTH EVERY DAY   polyethylene glycol powder powder Commonly known as:  GLYCOLAX/MIRALAX MIX 17 G AND TAKE BY MOUTH DAILY. What changed:  See the new instructions.   rOPINIRole 0.25 MG tablet Commonly known as:  REQUIP TAKE 1 TABLET BY MOUTH 3 TIMES A DAY What changed:    how much to take  how to take this  when to take this   simvastatin 20 MG tablet Commonly known as:  ZOCOR Take 1 tablet (20 mg total) by mouth at bedtime.   traMADol 50 MG tablet Commonly known as:  ULTRAM TAKE 1 TABLET BY MOUTH TWICE A DAY AS NEEDED FOR PAIN   traZODone 100 MG tablet Commonly known as:  DESYREL TAKE 1 TABLET (100 MG TOTAL) BY MOUTH AT BEDTIME.       Discharge Instructions: Please refer to Patient Instructions section of EMR for full details.  Patient was counseled important signs and symptoms that should prompt return to medical care, changes in medications, dietary instructions, activity restrictions, and follow up appointments.   Follow-Up Appointments: Follow-up Information    Hensel, Jamal Collin, MD. Go on 09/15/2017.   Specialty:  Family Medicine Why:  02:10PM Contact information: [place] [address] [phone]           Bonnita Hollow, MD 09/14/2017, 9:53 AM PGY-1, [place]

## 2017-09-15 ENCOUNTER — Encounter: Payer: Self-pay | Admitting: Family Medicine

## 2017-09-15 ENCOUNTER — Ambulatory Visit (INDEPENDENT_AMBULATORY_CARE_PROVIDER_SITE_OTHER): Payer: PPO | Admitting: Family Medicine

## 2017-09-15 ENCOUNTER — Other Ambulatory Visit: Payer: Self-pay

## 2017-09-15 DIAGNOSIS — R079 Chest pain, unspecified: Secondary | ICD-10-CM | POA: Diagnosis not present

## 2017-09-15 DIAGNOSIS — E039 Hypothyroidism, unspecified: Secondary | ICD-10-CM

## 2017-09-15 DIAGNOSIS — F4329 Adjustment disorder with other symptoms: Secondary | ICD-10-CM | POA: Diagnosis not present

## 2017-09-15 DIAGNOSIS — R7989 Other specified abnormal findings of blood chemistry: Secondary | ICD-10-CM

## 2017-09-15 NOTE — Patient Instructions (Signed)
I hope the family meeting goes well tomorrow night. No changes in your medications.  See me again in 2-4 weeks just to double on you and your medications.   I will call tomorrow with the thyroid test results.

## 2017-09-16 ENCOUNTER — Encounter: Payer: Self-pay | Admitting: Family Medicine

## 2017-09-16 DIAGNOSIS — F4329 Adjustment disorder with other symptoms: Secondary | ICD-10-CM | POA: Insufficient documentation

## 2017-09-16 LAB — TSH: TSH: 7.63 u[IU]/mL — AB (ref 0.450–4.500)

## 2017-09-16 MED ORDER — LEVOTHYROXINE SODIUM 25 MCG PO TABS: 25.0000 ug | ORAL_TABLET | Freq: Every day | ORAL | 1 refills | Status: DC

## 2017-09-16 NOTE — Assessment & Plan Note (Signed)
TSH is climbing.  Will start low dose levothyroxine.  Increase slowly given CAD.

## 2017-09-16 NOTE — Assessment & Plan Note (Signed)
Seems to be improving.

## 2017-09-16 NOTE — Progress Notes (Signed)
   Subjective:    Patient ID: DEMARIA DEENEY, female    DOB: 05-29-1931, 84 y.o.   MRN: [id number]  HPI FU hospitalization for what was determined to be non cardiac chest pain.  She is48 hours out of hospital and having less (minimal) chest discomfort.    Stress (anxiety/depression) was felt to play a role in her symptoms.  Her granddaughter (more like a daughter since Ms. Spruiell largely raised) had her husband leave her after 89 years of marriage.  That granddaughter is now living with Ms Schroeck.  Ms Suman and family are adjusting she feels more optimistic about the future.    Finally, no recent TSH.  It has been climbing and I have been treating as subclinical hypothyroidism.    Review of Systems     Objective:   Physical Exam WS Noted. Lungs clear Cardiac RRR without m or g        Assessment & Plan:

## 2017-09-16 NOTE — Assessment & Plan Note (Signed)
Likely non cardiac.  Given recent normal myoview, agree with cards opinion that no further WU needed at this time.

## 2017-09-30 ENCOUNTER — Ambulatory Visit: Payer: PPO | Admitting: Cardiology

## 2017-09-30 ENCOUNTER — Encounter: Payer: Self-pay | Admitting: Cardiology

## 2017-09-30 VITALS — BP 118/60 | HR 54 | Ht 61.0 in | Wt 132.0 lb

## 2017-09-30 DIAGNOSIS — I480 Paroxysmal atrial fibrillation: Secondary | ICD-10-CM

## 2017-09-30 NOTE — Progress Notes (Signed)
09/30/2017 Jacqueline [name]   1931/03/30  [id number]  Primary Physician Andria Frames Jamal Collin, MD Primary Cardiologist: Dr [name]  HPI:  84 y.o.femalewith a hx of CAD s/p CABG x 2 in 2006. A Cath done in 2016 showed an occluded LIMA-LAD with filling of the LAD via the SVG-OM. She has a history of paroxysmal atrial fibrillation and atrial flutter requring cardioversion in 2015. She is on Eliquis for anticoagulation. Other problems include a history of prior GI bleed, chronic diastolic CHF, CKD stage III, pulmonary hypertension, HLD and HTN. She was admitted in Aug 2018 with chest pain and tachycardia and was found to be in AF. She was placed on Amiodarone, she actually converted before this was started. She was in the office  08/18/17 for follow up. She continued to have intermittent vague chest "pressure". Its not always exertional and she doesn't take anything for it. She denies having any palpitations. It does not radiate to her arms or jaw. Amlodipine was added and she had an OP Myoview which was low risk. She was seen in follow up 09/08/17 and he Imdur was increased. She then presented to the ED with chest pain 09/12/17- MI r/o.   She is in the office today for follow up. She tells me feels much better. She says Dr Andria Frames started her on low dose Synthroid (TSH was 5.3 when Amiodarone started Dec 2018- TSH 7.6 on 09/15/17). She says she no longer has any chest tightness and denies palpitations.     Current Outpatient Medications  Medication Sig Dispense Refill  . acetaminophen (TYLENOL) 500 MG tablet Take 500-1,000 mg by mouth every 6 (six) hours as needed for moderate pain or headache.    . ALPRAZolam (XANAX) 0.5 MG tablet TAKE 1 TABLET BY MOUTH TWICE A DAY AS NEEDED FOR ANXIETY (Patient taking differently: TAKE 0.5 MG BY MOUTH AT BEDTIME AND 0.25 MG BY MOUTH DAILY  AS NEEDED FOR ANXIETY) 60 tablet 5  . amiodarone (PACERONE) 200 MG tablet Take 1 tablet (200 mg total) by mouth daily. 90 tablet 3  .  amLODipine (NORVASC) 5 MG tablet Take 1 tablet (5 mg total) by mouth daily. 180 tablet 3  . apixaban (ELIQUIS) 2.5 MG TABS tablet Take 1 tablet (2.5 mg total) 2 (two) times daily by mouth. 28 tablet 0  . furosemide (LASIX) 20 MG tablet TAKE 1 TABLET BY MOUTH EVERY DAY 90 tablet 3  . gabapentin (NEURONTIN) 100 MG capsule TAKE 1 CAPSULE (100 MG TOTAL) BY MOUTH 3 (THREE) TIMES DAILY. 270 capsule 3  . Iron-Vitamins (GERITOL COMPLETE PO) Take 1 tablet by mouth daily.    . isosorbide mononitrate (IMDUR) 60 MG 24 hr tablet TAKE 1 TABLET BY MOUTH EVERY DAY 30 tablet 6  . levothyroxine (SYNTHROID, LEVOTHROID) 25 MCG tablet Take 1 tablet (25 mcg total) by mouth daily before breakfast. 90 tablet 1  . metoprolol succinate (TOPROL-XL) 25 MG 24 hr tablet Take 1 tablet (25 mg total) by mouth daily. 90 tablet 3  . nitroGLYCERIN (NITROSTAT) 0.4 MG SL tablet PLACE 1 TABLET (0.4 MG TOTAL) UNDER THE TONGUE EVERY 5 (FIVE) MINUTES AS NEEDED FOR CHEST PAIN. X 3 doses 25 tablet 1  . Omega-3 Fatty Acids (FISH OIL) 1000 MG CAPS Take 1-2 capsules by mouth See admin instructions. Takes one in the morning and two capsules at night.    Marland Kitchen omeprazole (PRILOSEC) 20 MG capsule TAKE ONE CAPSULE BY MOUTH EVERY DAY 90 capsule 3  . polyethylene glycol powder (GLYCOLAX/MIRALAX)  powder MIX 17 G AND TAKE BY MOUTH DAILY. (Patient taking differently: MIX 17 G AND TAKE BY MOUTH DAILY AS NEEDED FOR CONSTIPATION) 527 g 12  . rOPINIRole (REQUIP) 0.25 MG tablet TAKE 1 TABLET BY MOUTH 3 TIMES A DAY (Patient taking differently: TAKE 0.25MG  BY MOUTH ONCE DAILY) 270 tablet 3  . simvastatin (ZOCOR) 20 MG tablet Take 1 tablet (20 mg total) by mouth at bedtime. 90 tablet 3  . traMADol (ULTRAM) 50 MG tablet TAKE 1 TABLET BY MOUTH TWICE A DAY AS NEEDED FOR PAIN 60 tablet 4  . traZODone (DESYREL) 100 MG tablet TAKE 1 TABLET (100 MG TOTAL) BY MOUTH AT BEDTIME. 90 tablet 3   No current facility-administered medications for this visit.     Allergies    Allergen Reactions  . Atorvastatin Other (See Comments)    Myalgias in legs  . Crestor [Rosuvastatin] Other (See Comments)    Myalgias in legs  . Morphine And Related Other (See Comments)    "Crazy thoughts, severe headache, sick"  . Prednisone     On two occasions has caused A fib    Past Medical History:  Diagnosis Date  . Arthritis    back  . Atrial fibrillation with rapid ventricular response ([place]) 02/2014   a. CHA2DS2VASc = 6 -> on eliquis;  b. 02/2014 s/p DCCV;  c. 08/2014 Echo: EF 55-60%, Gr 2 DD, mild MR, triv AI.  Marland Kitchen CAD (coronary artery disease)    a. s/p CABG x 2 (LIMA->LAD, VG->Diag);  b. 08/2014 Cath: LM nl, LAD 90p, LCX nl, RCA nl/dominant, LIMA->LAD atretic, VG->D2 patent w/ retrograde filling of LAD, EF 55-60%-->Med Rx.  . Diverticulitis 02/21/2012  . GERD (gastroesophageal reflux disease)   . Hyperlipemia   . Hypertension   . Insomnia     Social History   Socioeconomic History  . Marital status: Widowed    Spouse name: Not on file  . Number of children: Not on file  . Years of education: Not on file  . Highest education level: Not on file  Social Needs  . Financial resource strain: Not on file  . Food insecurity - worry: Not on file  . Food insecurity - inability: Not on file  . Transportation needs - medical: Not on file  . Transportation needs - non-medical: Not on file  Occupational History  . Not on file  Tobacco Use  . Smoking status: Never Smoker  . Smokeless tobacco: Never Used  Substance and Sexual Activity  . Alcohol use: No  . Drug use: No  . Sexual activity: No  Other Topics Concern  . 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 "I believe Jesus died on the cross to save me [id number] my sins. I am saved by the grace of God." 05/11/2017   Other: "I have a good life and am happy. My family is there when I need help." 05/11/2017   L. Silvano Rusk, RN, BSN  Family History  Problem Relation Age of Onset  . Parkinson's disease Mother   . Heart attack Brother   . Arthritis Brother   . Cancer Brother        Unknown  . Breast cancer Neg Hx      Review of Systems: General: negative for chills, fever, night sweats or weight changes.  Cardiovascular: negative for chest pain, dyspnea on exertion, edema, orthopnea, palpitations, paroxysmal nocturnal dyspnea or shortness of breath Dermatological: negative for rash Respiratory: negative for cough or wheezing Urologic: negative for hematuria Abdominal: negative for nausea, vomiting, diarrhea, bright red blood per rectum, melena, or hematemesis Neurologic: negative for visual changes, syncope, or dizziness All other systems reviewed and are otherwise negative except as noted above.    Blood pressure 118/60, pulse (!) 54, height 5\' 1"  (1.549 m), weight 132 lb (59.9 kg).  General appearance: alert, cooperative, no distress and thin, frail Neck: no carotid bruit and no JVD Lungs: clear to auscultation bilaterally Heart: regular rate and rhythm Extremities: extremities normal, atraumatic, no cyanosis or edema Skin: Skin color, texture, turgor normal. No rashes or lesions Neurologic: Grossly normal  EKG NSR, SB-54, QTc 453  ASSESSMENT AND PLAN:   Chest pain  Unclear what this was from but it is improved. She is on Imdur and Norvasc, Myoview low risk.   Hypertension,  uncontrolled B/P 118/60.   Chronic diastolic CHF (congestive heart failure) (HCC) Stable by history and exam Dry weight is 127-130  CKD (chronic kidney disease) stage 3, GFR 30-59 ml/min (HCC) Last SCr 1.65-GFR 32  Hx of CABG CABG x 2 2006- Cath in 2016 showed occluded LIMA to LAD with a patent SVG-Dx that filled the LAD  PAF (paroxysmal atrial fibrillation) ([place]) Recurrent PAF Dec 2018-placed on Amiodarone  Pulmonary arterial hypertension ([place]) Severe in 2016 but her echo Dec 2018 showed normal PA pressures  Long term current use of anticoagulant CHADS VASC=6- on Eliquis  Hypertensive cardiomyopathy, without heart failure (HCC) Normal LVF with grade 2 DD  Hypothyroid Low dose Synthroid added by Dr Andria Frames. The pt attributes improvement in her symptoms to the addition of Synthroid.   PLAN F/U Dr [name] 3 months. Consider decreasing Amiodarone to 100 mg daily.   Kerin Ransom PA-C 09/30/2017 3:19 PM

## 2017-09-30 NOTE — Progress Notes (Signed)
Thanks MCr 

## 2017-09-30 NOTE — Patient Instructions (Signed)
Medication Instructions:  Your physician recommends that you continue on your current medications as directed. Please refer to the Current Medication list given to you today.  Labwork: None   Testing/Procedures: None   Follow-Up: Your physician recommends that you schedule a follow-up appointment in: 3 months with Dr [name].  Any Other Special Instructions Will Be Listed Below (If Applicable). If you need a refill on your cardiac medications before your next appointment, please call your pharmacy.

## 2017-10-01 ENCOUNTER — Telehealth: Payer: Self-pay | Admitting: Cardiology

## 2017-10-01 NOTE — Telephone Encounter (Signed)
Patient has been informed that samples are available at the front.  Medication Samples have been provided to the patient.  Drug name: Eliquis       Strength: 2.5 mg        Qty: 3 boxes  LOT: [id number]  Exp.Date: Jul 2020

## 2017-10-01 NOTE — Telephone Encounter (Signed)
New Message   Patient calling the office for samples of medication:   1.  What medication and dosage are you requesting samples for? apixaban (ELIQUIS) 2.5 MG TABS tablet  2.  Are you currently out of this medication? No,   has 3   remaining

## 2017-10-01 NOTE — Telephone Encounter (Signed)
New Message    *STAT* If patient is at the pharmacy, call can be transferred to refill team.   1. Which medications need to be refilled? (please list name of each medication and dose if known) apixaban (ELIQUIS) 2.5 MG TABS tablet  2. Which pharmacy/location (including street and city if local pharmacy) is medication to be sent to? [facility] [address]   3. Do they need a 30 day or 90 day supply? 90 day

## 2017-10-04 MED ORDER — APIXABAN 2.5 MG PO TABS: 2.5000 mg | ORAL_TABLET | Freq: Two times a day (BID) | ORAL | 0 refills | Status: DC

## 2017-10-09 ENCOUNTER — Other Ambulatory Visit: Payer: Self-pay | Admitting: Family Medicine

## 2017-11-03 ENCOUNTER — Other Ambulatory Visit: Payer: Self-pay | Admitting: Family Medicine

## 2017-11-04 ENCOUNTER — Other Ambulatory Visit: Payer: Self-pay | Admitting: Family Medicine

## 2017-11-04 DIAGNOSIS — K219 Gastro-esophageal reflux disease without esophagitis: Secondary | ICD-10-CM

## 2017-11-18 ENCOUNTER — Telehealth: Payer: Self-pay | Admitting: Cardiovascular Disease

## 2017-11-18 NOTE — Telephone Encounter (Signed)
Medication samples have been provided to the patient.  Drug name: elquis 2.5  Qty: 2 boxes  LOT: [id number]  Exp.Date: 01/2019  Samples left at front desk for patient pick-up. Patient notified.  Sheral Apley M 1:36 PM 11/18/2017

## 2017-11-18 NOTE — Telephone Encounter (Signed)
New Message:      Pt c/o medication issue:  1. Name of Medication: apixaban (ELIQUIS) 2.5 MG TABS tablet  2. How are you currently taking this medication (dosage and times per day)?  Take 1 tablet (2.5 mg total) by mouth 2 (two) times daily. 3. Are you having a reaction (difficulty breathing--STAT)? No  4. What is your medication issue? Pt states she only has enough medication to last her til 5/1. Pt states she is needing some samples because she is not able to get another prescription at a discounted rate until the middle of May.

## 2017-12-01 MED ORDER — APIXABAN 2.5 MG PO TABS: 2.5000 mg | ORAL_TABLET | Freq: Two times a day (BID) | ORAL | 3 refills | Status: DC

## 2017-12-03 ENCOUNTER — Other Ambulatory Visit: Payer: Self-pay | Admitting: Family Medicine

## 2017-12-07 DIAGNOSIS — R69 Illness, unspecified: Secondary | ICD-10-CM | POA: Diagnosis not present

## 2017-12-08 ENCOUNTER — Encounter: Payer: Self-pay | Admitting: Family Medicine

## 2017-12-08 ENCOUNTER — Other Ambulatory Visit: Payer: Self-pay

## 2017-12-08 ENCOUNTER — Ambulatory Visit (INDEPENDENT_AMBULATORY_CARE_PROVIDER_SITE_OTHER): Payer: PPO | Admitting: Family Medicine

## 2017-12-08 DIAGNOSIS — D500 Iron deficiency anemia secondary to blood loss (chronic): Secondary | ICD-10-CM | POA: Diagnosis not present

## 2017-12-08 DIAGNOSIS — E039 Hypothyroidism, unspecified: Secondary | ICD-10-CM

## 2017-12-08 DIAGNOSIS — I10 Essential (primary) hypertension: Secondary | ICD-10-CM | POA: Diagnosis not present

## 2017-12-08 DIAGNOSIS — I5032 Chronic diastolic (congestive) heart failure: Secondary | ICD-10-CM | POA: Diagnosis not present

## 2017-12-08 NOTE — Assessment & Plan Note (Addendum)
Likely over treated hypertension.  Stop amlodipine.

## 2017-12-08 NOTE — Patient Instructions (Signed)
I think we have you on too much blood pressure medicine.  We will make one change at a time.   First change is to stop you amlodipine.   Call me in one week to tell me how you are feeling and what your morning blood pressures have been running. I may also stop your metoprolol next week depending on what you tell me. I will call tomorrow with your blood test results.

## 2017-12-08 NOTE — Progress Notes (Signed)
   Subjective:    Patient ID: Jacqueline [name], female    DOB: [birth year], 84 y.o.   MRN: [id number]  HPI Dizziness is more light headedness than vertigo.  Checks home BP.  Morning BPs are typically less than 957 systolic.  Recently has fallen x 3.  No fever chills, or focal neuro sx.  Denies changes in appetite, bowel, bladder or wt.  No injuries with falls.  No true syncope.  Review of Systems     Objective:   Physical Exam Low BP confirmed Lung clear Cardiac RRR without m or g Ext no edema        Assessment & Plan:

## 2017-12-08 NOTE — Assessment & Plan Note (Addendum)
Labs.  Seems to be at dry wt. I think she is euvolemic (not over diuresed.)

## 2017-12-09 ENCOUNTER — Encounter: Payer: Self-pay | Admitting: Family Medicine

## 2017-12-09 LAB — CMP14+EGFR
ALBUMIN: 4.6 g/dL (ref 3.5–4.7)
ALK PHOS: 72 IU/L (ref 39–117)
ALT: 19 IU/L (ref 0–32)
AST: 23 IU/L (ref 0–40)
Albumin/Globulin Ratio: 2 (ref 1.2–2.2)
BILIRUBIN TOTAL: 0.3 mg/dL (ref 0.0–1.2)
BUN / CREAT RATIO: 13 (ref 12–28)
BUN: 22 mg/dL (ref 8–27)
CHLORIDE: 102 mmol/L (ref 96–106)
CO2: 23 mmol/L (ref 20–29)
Calcium: 9.7 mg/dL (ref 8.7–10.3)
Creatinine, Ser: 1.73 mg/dL — ABNORMAL HIGH (ref 0.57–1.00)
GFR calc Af Amer: 31 mL/min/{1.73_m2} — ABNORMAL LOW (ref >59)
GFR calc non Af Amer: 27 mL/min/{1.73_m2} — ABNORMAL LOW (ref >59)
GLOBULIN, TOTAL: 2.3 g/dL (ref 1.5–4.5)
GLUCOSE: 81 mg/dL (ref 65–99)
Potassium: 4.7 mmol/L (ref 3.5–5.2)
SODIUM: 140 mmol/L (ref 134–144)
Total Protein: 6.9 g/dL (ref 6.0–8.5)

## 2017-12-09 LAB — CBC
Hematocrit: 37.4 % (ref 34.0–46.6)
Hemoglobin: 11.9 g/dL (ref 11.1–15.9)
MCH: 30.9 pg (ref 26.6–33.0)
MCHC: 31.8 g/dL (ref 31.5–35.7)
MCV: 97 fL (ref 79–97)
PLATELETS: 208 10*3/uL (ref 150–379)
RBC: 3.85 x10E6/uL (ref 3.77–5.28)
RDW: 14.8 % (ref 12.3–15.4)
WBC: 4.1 10*3/uL (ref 3.4–10.8)

## 2017-12-09 LAB — TSH: TSH: 6.01 u[IU]/mL — AB (ref 0.450–4.500)

## 2017-12-09 MED ORDER — LEVOTHYROXINE SODIUM 50 MCG PO TABS: 50.0000 ug | ORAL_TABLET | Freq: Every day | ORAL | 3 refills | Status: DC

## 2017-12-09 NOTE — Assessment & Plan Note (Signed)
TSH still high, bump up synthyroid dose.

## 2017-12-09 NOTE — Assessment & Plan Note (Signed)
Needs recheck hgb to make sure not contributing. Lab show Hgb OK

## 2017-12-15 ENCOUNTER — Telehealth: Payer: Self-pay

## 2017-12-15 DIAGNOSIS — I10 Essential (primary) hypertension: Secondary | ICD-10-CM

## 2017-12-15 MED ORDER — ISOSORBIDE MONONITRATE ER 60 MG PO TB24: 30.0000 mg | ORAL_TABLET | Freq: Every day | ORAL | 6 refills | Status: DC

## 2017-12-15 NOTE — Telephone Encounter (Signed)
Patient called nurse line to report to PCP what her blood pressure has been since stopping Amlodipine after 12/08/17 office visit.  Patient states that systolic BP on 6/31 and 4/97 was 98 and 99. Since then her readings have been 129/74, 130/76, and 143/68.   States dizziness has improved but still lingering a little. States no more falls.  Callback is [phone].  Danley Danker, RN [facility] RN)

## 2017-12-15 NOTE — Telephone Encounter (Signed)
Improved.  No falls.  Still some lightheadedness.  BP noted.  Will decrease imdur to 30 mg daily and monitor.  Like before, she will check home BPs and call me in 1-2 weeks.

## 2018-01-13 ENCOUNTER — Other Ambulatory Visit: Payer: Self-pay | Admitting: Family Medicine

## 2018-01-17 ENCOUNTER — Ambulatory Visit: Payer: PPO | Admitting: Cardiovascular Disease

## 2018-01-17 ENCOUNTER — Encounter: Payer: Self-pay | Admitting: Cardiovascular Disease

## 2018-01-17 VITALS — BP 118/60 | HR 56 | Ht 61.0 in | Wt 132.0 lb

## 2018-01-17 DIAGNOSIS — I480 Paroxysmal atrial fibrillation: Secondary | ICD-10-CM | POA: Diagnosis not present

## 2018-01-17 DIAGNOSIS — I2721 Secondary pulmonary arterial hypertension: Secondary | ICD-10-CM | POA: Diagnosis not present

## 2018-01-17 DIAGNOSIS — E785 Hyperlipidemia, unspecified: Secondary | ICD-10-CM

## 2018-01-17 DIAGNOSIS — I5032 Chronic diastolic (congestive) heart failure: Secondary | ICD-10-CM

## 2018-01-17 DIAGNOSIS — N184 Chronic kidney disease, stage 4 (severe): Secondary | ICD-10-CM | POA: Diagnosis not present

## 2018-01-17 DIAGNOSIS — Z7901 Long term (current) use of anticoagulants: Secondary | ICD-10-CM | POA: Diagnosis not present

## 2018-01-17 DIAGNOSIS — I10 Essential (primary) hypertension: Secondary | ICD-10-CM

## 2018-01-17 DIAGNOSIS — I25728 Atherosclerosis of autologous artery coronary artery bypass graft(s) with other forms of angina pectoris: Secondary | ICD-10-CM | POA: Diagnosis not present

## 2018-01-17 MED ORDER — ISOSORBIDE MONONITRATE ER 30 MG PO TB24: 30.0000 mg | ORAL_TABLET | Freq: Every day | ORAL | 3 refills | Status: DC

## 2018-01-17 MED ORDER — AMIODARONE HCL 100 MG PO TABS: 100.0000 mg | ORAL_TABLET | Freq: Every day | ORAL | 3 refills | Status: DC

## 2018-01-17 NOTE — Progress Notes (Signed)
Patient ID: Jacqueline [name], female   DOB: [birth year], 84 y.o.   MRN: [id number]    Cardiology Office Note    Date:  01/18/2018   ID:  Jacqueline [name], DOB 07-01-1931, MRN [id number]  PCP:  [name], MD  Cardiologist:   Sanda Klein, MD   Chief Complaint  Patient presents with  . Follow-up    CAD, CHF, PAF    History of Present Illness:  Jacqueline [name] is a 84 y.o. female with coronary artery disease and previous bypass surgery, paroxysmal atrial fibrillation requiring previous cardioversion, diastolic heart failure, HTN.  She is done well from a cardiac symptom point of view, but she had 5 falls. None of them were preceded by loss of consciousness but she felt dizzy.  Thankfully, she did not have any injuries or bleeding.  Symptoms improved after Dr. Andria Frames at that her dose of isosorbide.  This has not led to a recurrence of angina pectoris.  The patient specifically denies any chest pain at rest exertion, dyspnea at rest or with exertion, orthopnea, paroxysmal nocturnal dyspnea, syncope, palpitations, focal neurological deficits, intermittent claudication, lower extremity edema, unexplained weight gain, cough, hemoptysis or wheezing.  She continues to be very conscientious with sodium restriction and monitors her weight regularly.  Her home scale she is consistently around 130 pounds.  She was hospitalized in August 2018 with heart failure related to atrial flutter with rapid ventricular response.  She has known severe pulmonary artery hypertension. Right heart catheterization October 2016 showed a PA pressure of 77/28 (mean 49 mL Hg), explained at least in part by left heart failure with a mean wedge pressure of 27 mmHg (V wave 41 mmHg).  Coronary angiography (2016)showed atretic LIMA to LAD but patent SVG to diagonal and good distal flow. She has normal left ventricular systolic function. She previously underwent electrical cardioversion for atrial fibrillation in 2015, anticoagulation  was stopped in 2016 following an episode of GI bleeding.  She continues to live independently and fiercely values her autonomy.  One of her granddaughters used to live with her, but has since moved out to be closer to work.   Past Medical History:  Diagnosis Date  . Arthritis    back  . Atrial fibrillation with rapid ventricular response ([place]) 02/2014   a. CHA2DS2VASc = 6 -> on eliquis;  b. 02/2014 s/p DCCV;  c. 08/2014 Echo: EF 55-60%, Gr 2 DD, mild MR, triv AI.  Marland Kitchen CAD (coronary artery disease)    a. s/p CABG x 2 (LIMA->LAD, VG->Diag);  b. 08/2014 Cath: LM nl, LAD 90p, LCX nl, RCA nl/dominant, LIMA->LAD atretic, VG->D2 patent w/ retrograde filling of LAD, EF 55-60%-->Med Rx.  . Diverticulitis 02/21/2012  . GERD (gastroesophageal reflux disease)   . Hyperlipemia   . Hypertension   . Insomnia     Past Surgical History:  Procedure Laterality Date  . ABDOMINAL HYSTERECTOMY  1975  . CARDIAC CATHETERIZATION N/A 05/09/2015   Procedure: Right Heart Cath;  Surgeon: Larey Dresser, MD;  Location: [place] [facility];  Service: Cardiovascular;  Laterality: N/A;  . CARDIOVERSION N/A 02/28/2014   Procedure: CARDIOVERSION;  Surgeon: Sanda Klein, MD;  Location: [place] [facility];  Service: Cardiovascular;  Laterality: N/A;  . CATARACT EXTRACTION Bilateral 2014   with lens implanted  . COLONOSCOPY N/A 02/19/2015   Procedure: COLONOSCOPY;  Surgeon: Ladene Artist, MD;  Location: [facility]COPY;  Service: Endoscopy;  Laterality: N/A;  . CORONARY ARTERY BYPASS GRAFT  2006  off-pump bypass surgery with LIMA to the LAD and SVG to the second diagonal artery ( performed by Dr Servando Snare)   . JOINT REPLACEMENT Bilateral    L-2004, R-2006  . LEFT HEART CATHETERIZATION WITH CORONARY/GRAFT ANGIOGRAM N/A 09/17/2014   Procedure: LEFT HEART CATHETERIZATION WITH Beatrix Fetters;  Surgeon: Troy Sine, MD;  [facility];  Service: Cardiovascular;  Laterality: N/A;  . REIMPLANTATION OF TOTAL KNEE Right  10/08/2014   Procedure: REIMPLANTATION OF RIGHT TOTAL KNEE ARTHROPLASTY WITH REMOVAL OF ANTIBIOTIC SPACER;  Surgeon: Paralee Cancel, MD;  Location: [facility];  Service: Orthopedics;  Laterality: Right;  . ROTATOR CUFF REPAIR Bilateral    r-1999, l- 2005  . TEE WITHOUT CARDIOVERSION N/A 02/28/2014   Procedure: TRANSESOPHAGEAL ECHOCARDIOGRAM (TEE);  Surgeon: Sanda Klein, MD;  Location: [place];  Service: Cardiovascular;  Laterality: N/A;  . TOTAL KNEE ARTHROPLASTY Right 07/02/2014   Procedure: Resection of Infected Right Total Knee Arthroplasty with placement antibiotic spacer;  Surgeon: Mauri Pole, MD;  Location: [facility];  Service: Orthopedics;  Laterality: Right;    Outpatient Medications Prior to Visit  Medication Sig Dispense Refill  . acetaminophen (TYLENOL) 500 MG tablet Take 500-1,000 mg by mouth every 6 (six) hours as needed for moderate pain or headache.    . ALPRAZolam (XANAX) 0.5 MG tablet TAKE 1 TABLET BY MOUTH TWICE A DAY AS NEEDED ANXIETY 60 tablet 5  . apixaban (ELIQUIS) 2.5 MG TABS tablet Take 1 tablet (2.5 mg total) by mouth 2 (two) times daily. 180 tablet 3  . furosemide (LASIX) 20 MG tablet TAKE 1 TABLET BY MOUTH EVERY DAY 90 tablet 3  . gabapentin (NEURONTIN) 100 MG capsule TAKE 1 CAPSULE (100 MG TOTAL) BY MOUTH 3 (THREE) TIMES DAILY. 270 capsule 3  . Iron-Vitamins (GERITOL COMPLETE PO) Take 1 tablet by mouth daily.    Marland Kitchen levothyroxine (SYNTHROID, LEVOTHROID) 50 MCG tablet Take 1 tablet (50 mcg total) by mouth daily. 90 tablet 3  . metoprolol succinate (TOPROL-XL) 25 MG 24 hr tablet Take 1 tablet (25 mg total) by mouth daily. 90 tablet 3  . nitroGLYCERIN (NITROSTAT) 0.4 MG SL tablet PLACE 1 TABLET (0.4 MG TOTAL) UNDER THE TONGUE EVERY 5 (FIVE) MINUTES AS NEEDED FOR CHEST PAIN. X 3 doses 25 tablet 1  . Omega-3 Fatty Acids (FISH OIL) 1000 MG CAPS Take 1-2 capsules by mouth See admin instructions. Takes one in the morning and two capsules at night.    Marland Kitchen omeprazole (PRILOSEC) 20  MG capsule TAKE ONE CAPSULE BY MOUTH EVERY DAY 90 capsule 3  . polyethylene glycol powder (GLYCOLAX/MIRALAX) powder MIX 17 G AND TAKE BY MOUTH DAILY. (Patient taking differently: MIX 17 G AND TAKE BY MOUTH DAILY AS NEEDED FOR CONSTIPATION) 527 g 12  . rOPINIRole (REQUIP) 0.25 MG tablet TAKE 0.25MG  BY MOUTH ONCE DAILY 90 tablet 3  . simvastatin (ZOCOR) 20 MG tablet Take 1 tablet (20 mg total) by mouth at bedtime. 90 tablet 3  . traMADol (ULTRAM) 50 MG tablet TAKE 1 TABLET BY MOUTH TWICE A DAY AS NEEDED FOR PAIN 60 tablet 5  . traZODone (DESYREL) 100 MG tablet TAKE 1 TABLET (100 MG TOTAL) BY MOUTH AT BEDTIME. 90 tablet 3  . amiodarone (PACERONE) 200 MG tablet Take 1 tablet (200 mg total) by mouth daily. 90 tablet 3  . isosorbide mononitrate (IMDUR) 60 MG 24 hr tablet Take 0.5 tablets (30 mg total) by mouth daily. TAKE 1 TABLET BY MOUTH EVERY DAY (Patient taking differently: Take 60  mg by mouth daily. TAKE 1 TABLET BY MOUTH EVERY DAY) 30 tablet 6   No facility-administered medications prior to visit.      Allergies:   Atorvastatin; Crestor [rosuvastatin]; Morphine and related; and Prednisone   Social History   Socioeconomic History  . Marital status: Widowed    Spouse name: Not on file  . Number of children: Not on file  . Years of education: Not on file  . Highest education level: Not on file  Occupational History  . Not on file  Social Needs  . Financial resource strain: Not on file  . Food insecurity:    Worry: Not on file    Inability: Not on file  . Transportation needs:    Medical: Not on file    Non-medical: Not on file  Tobacco Use  . Smoking status: Never Smoker  . Smokeless tobacco: Never Used  Substance and Sexual Activity  . Alcohol use: No  . Drug use: No  . Sexual activity: Never  Lifestyle  . Physical activity:    Days per week: Not on file    Minutes per session: Not on file  . Stress: Not on file  Relationships  . Social connections:    Talks on phone: Not  on file    Gets together: Not on file    Attends religious service: Not on file    Active member of club or organization: Not on file    Attends meetings of clubs or organizations: Not on file    Relationship status: Not on file  Other Topics Concern  . 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 "I believe Jesus died on the cross to save me [id number] my sins. I am saved by the grace of God." 05/11/2017   Other: "I have a good life and am happy. My family is there when I need help." 05/11/2017   L. Ducatte, RN, BSN                                                                                                   Family History:  The patient's family history includes Arthritis in her brother; Cancer in her brother; Heart attack in her brother; Parkinson's disease in her mother.   ROS:   Please see the history of present illness.    ROS All other systems reviewed and are negative.   PHYSICAL EXAM:   VS:  BP 118/60 (BP Location: Left Arm, Patient Position: Sitting, Cuff Size: Normal)   Pulse (!) 56   Ht 5\' 1"  (1.549 m)   Wt 132  lb (59.9 kg)   BMI 24.94 kg/m      General: Alert, oriented x3, no distress, lean Head: no evidence of trauma, PERRL, EOMI, no exophtalmos or lid lag, no myxedema, no xanthelasma; normal ears, nose and oropharynx Neck: normal jugular venous pulsations and no hepatojugular reflux; brisk carotid pulses without delay and no carotid bruits Chest: clear to  auscultation, no signs of consolidation by percussion or palpation, normal fremitus, symmetrical and full respiratory excursions Cardiovascular: normal position and quality of the apical impulse, regular rhythm, normal first and second heart sounds, no murmurs, rubs or gallops Abdomen: no tenderness or distention, no masses by palpation, no abnormal pulsatility or arterial bruits, normal bowel sounds, no hepatosplenomegaly Extremities: no clubbing, cyanosis or edema; 2+ radial, ulnar and brachial pulses bilaterally; 2+ right femoral, posterior tibial and dorsalis pedis pulses; 2+ left femoral, posterior tibial and dorsalis pedis pulses; no subclavian or femoral bruits Neurological: grossly nonfocal Psych: Normal mood and affect   Wt Readings from Last 3 Encounters:  01/17/18 132 lb (59.9 kg)  12/08/17 134 lb 9.6 oz (61.1 kg)  09/30/17 132 lb (59.9 kg)      Studies/Labs Reviewed:   EKG:  EKG is not ordered today.  The EKG from September 30, 2017 shows sinus bradycardia with a marked first-degree AV block and is otherwise normal.  Recent Labs: 03/08/2017: B Natriuretic Peptide 646.4 07/22/2017: Magnesium 1.9 12/08/2017: ALT 19; BUN 22; Creatinine, Ser 1.73; Hemoglobin 11.9; Platelets 208; Potassium 4.7; Sodium 140; TSH 6.010   Lipid Panel    Component Value Date/Time   CHOL 149 04/08/2016 1153   TRIG 130 04/08/2016 1153   HDL 53 04/08/2016 1153   CHOLHDL 2.8 04/08/2016 1153   VLDL 26 04/08/2016 1153   LDLCALC 70 04/08/2016 1153    ASSESSMENT:     1. Paroxysmal atrial fibrillation (HCC)   2. Long term (current) use of anticoagulants   3. Coronary artery disease of autologous bypass graft with stable angina pectoris ([place])   4. Chronic diastolic CHF (congestive heart failure) ([place])   5. Pulmonary arterial hypertension ([place])   6. CKD (chronic kidney disease) stage 4, GFR 15-29 ml/min (HCC)   7. Dyslipidemia   8. Essential hypertension      PLAN:  In order of problems listed  above:  1. AFib/flutter: She has never felt palpitations, but as far as I can tell there is been no clinically significant recurrence of the arrhythmia.Marland Kitchen CHADSVasc 5 (age 22, gender, CAD, CHF). 2. Eliquis: Remote history of GI bleeding when on anticoagulation, but without any bleeding in the recent past.  Lower dose based on age, renal function small body habitus. 3. CAD: Had to cut back on the dose of nitrates due to falls, presumably due to orthostatic hypotension.  Despite that she has not had any new episodes of chest pain. 4. CHF: Maintaining euvolemia, seems to do well at a weight of 130 pounds on home scale.  Very conscientious with sodium restriction. 5. PAH: Enlarged or could be explained by left heart failure, although cannot exclude a component of arterial or other pulmonary hypertension elevation was so striking. 6. CKD: Monitor periodically and avoid excessive diuresis. Estimated creatinine clearance Just under 30 7. HLP: All lipid parameters within desirable range when last checked. On statin. 8. HTN: well controlled   Medication Adjustments/Labs and Tests Ordered: Current medicines are reviewed at length with the patient today.  Concerns regarding medicines are outlined above.  Medication changes, Labs and Tests ordered today are  listed in the Patient Instructions below. Patient Instructions  Dr [name] has recommended making the following medication changes: 1. DECREASE Amiodarone to 100 mg daily  Your physician recommends that you schedule a follow-up appointment in 3 months with Kerin Ransom, PA-C.  Dr [name] recommends that you schedule a follow-up appointment in 6 months. You will receive a reminder letter in the mail two months in advance. If you don't receive a letter, please call our office to schedule the follow-up appointment.  If you need a refill on your cardiac medications before your next appointment, please call your pharmacy.      Signed, Sanda Klein,  MD  01/18/2018 9:15 AM    [address], [place], [place]  [address] Phone: [phone]; Fax: [phone]

## 2018-01-17 NOTE — Patient Instructions (Signed)
Dr [name] has recommended making the following medication changes: 1. DECREASE Amiodarone to 100 mg daily  Your physician recommends that you schedule a follow-up appointment in 3 months with Kerin Ransom, PA-C.  Dr [name] recommends that you schedule a follow-up appointment in 6 months. You will receive a reminder letter in the mail two months in advance. If you don't receive a letter, please call our office to schedule the follow-up appointment.  If you need a refill on your cardiac medications before your next appointment, please call your pharmacy.

## 2018-01-18 DIAGNOSIS — I25728 Atherosclerosis of autologous artery coronary artery bypass graft(s) with other forms of angina pectoris: Secondary | ICD-10-CM

## 2018-01-26 ENCOUNTER — Other Ambulatory Visit: Payer: Self-pay | Admitting: Student

## 2018-03-01 ENCOUNTER — Other Ambulatory Visit: Payer: Self-pay | Admitting: Family Medicine

## 2018-03-01 DIAGNOSIS — R7989 Other specified abnormal findings of blood chemistry: Secondary | ICD-10-CM

## 2018-03-24 ENCOUNTER — Ambulatory Visit: Payer: PPO | Admitting: Family Medicine

## 2018-04-15 ENCOUNTER — Other Ambulatory Visit: Payer: Self-pay | Admitting: Family Medicine

## 2018-04-15 DIAGNOSIS — G609 Hereditary and idiopathic neuropathy, unspecified: Secondary | ICD-10-CM

## 2018-04-19 ENCOUNTER — Encounter: Payer: Self-pay | Admitting: Cardiology

## 2018-04-19 ENCOUNTER — Ambulatory Visit: Payer: PPO | Admitting: Cardiology

## 2018-04-19 VITALS — BP 122/64 | HR 53 | Ht 61.0 in | Wt 133.2 lb

## 2018-04-19 DIAGNOSIS — Z7901 Long term (current) use of anticoagulants: Secondary | ICD-10-CM

## 2018-04-19 DIAGNOSIS — I5032 Chronic diastolic (congestive) heart failure: Secondary | ICD-10-CM

## 2018-04-19 DIAGNOSIS — N184 Chronic kidney disease, stage 4 (severe): Secondary | ICD-10-CM | POA: Diagnosis not present

## 2018-04-19 DIAGNOSIS — I480 Paroxysmal atrial fibrillation: Secondary | ICD-10-CM

## 2018-04-19 DIAGNOSIS — Z951 Presence of aortocoronary bypass graft: Secondary | ICD-10-CM

## 2018-04-19 DIAGNOSIS — I119 Hypertensive heart disease without heart failure: Secondary | ICD-10-CM

## 2018-04-19 DIAGNOSIS — I43 Cardiomyopathy in diseases classified elsewhere: Secondary | ICD-10-CM

## 2018-04-19 DIAGNOSIS — I10 Essential (primary) hypertension: Secondary | ICD-10-CM | POA: Diagnosis not present

## 2018-04-19 NOTE — Progress Notes (Signed)
04/19/2018 [name]   [birth year]  [id number]  Primary Physician Andria Frames Jamal Collin, MD Primary Cardiologist: Dr [name]  HPI:  Pleasant 84 y.o.femalewith a hx of CAD s/p CABGx 2 in 2006. A Cath done in 2016 showed an occluded LIMA-LAD with filling of the LAD via the SVG-OM. She has a history of PAF and atrial flutter requring cardioversion in 2015. She ison Eliquis for anticoagulation. Other problems include a history ofprior GI bleed, chronic diastolic CHF, CKD stage III, pulmonary hypertension, HLD and HTN. She was admitted in Aug 2018 with chest pain and tachycardia and was found to be in AF. She was placed on Amiodarone, she actually converted before this was started. An OP Myoview was done in Jan 2019 and was low risk. She then presented to the ED with chest pain 09/12/17- MI r/o. Dr Andria Frames started her on low dose Synthroid (TSH was 5.3 when Amiodarone started Dec 2018- TSH 7.6 on 09/15/17) and she says she no longer has any chest tightness and denies palpitations.  She saw Dr [name] in June 2019 and was doing well though her HR was slow-55. Dr [name] also notes she had some falls at home. He decreased her Amiodarone to 100 mg daily and she is seen today in follow up. She continues to do well, no palpitation, no chest pain. She had one fall in July, she says she tripped.    Current Outpatient Medications  Medication Sig Dispense Refill  . acetaminophen (TYLENOL) 500 MG tablet Take 500-1,000 mg by mouth every 6 (six) hours as needed for moderate pain or headache.    . ALPRAZolam (XANAX) 0.5 MG tablet TAKE 1 TABLET BY MOUTH TWICE A DAY AS NEEDED ANXIETY 60 tablet 5  . amiodarone (PACERONE) 100 MG tablet Take 1 tablet (100 mg total) by mouth daily. 90 tablet 3  . apixaban (ELIQUIS) 2.5 MG TABS tablet Take 1 tablet (2.5 mg total) by mouth 2 (two) times daily. 180 tablet 3  . furosemide (LASIX) 20 MG tablet TAKE 1 TABLET BY MOUTH EVERY DAY 90 tablet 3  . gabapentin (NEURONTIN) 100 MG  capsule TAKE 1 CAPSULE (100 MG TOTAL) BY MOUTH 3 (THREE) TIMES DAILY. 270 capsule 3  . Iron-Vitamins (GERITOL COMPLETE PO) Take 1 tablet by mouth daily.    . isosorbide mononitrate (IMDUR) 30 MG 24 hr tablet Take 1 tablet (30 mg total) by mouth daily. 90 tablet 3  . levothyroxine (SYNTHROID, LEVOTHROID) 50 MCG tablet Take 1 tablet (50 mcg total) by mouth daily. 90 tablet 3  . metoprolol succinate (TOPROL-XL) 25 MG 24 hr tablet TAKE 1 TABLET BY MOUTH EVERY DAY 90 tablet 3  . nitroGLYCERIN (NITROSTAT) 0.4 MG SL tablet PLACE 1 TABLET (0.4 MG TOTAL) UNDER THE TONGUE EVERY 5 (FIVE) MINUTES AS NEEDED FOR CHEST PAIN. X 3 doses 25 tablet 1  . Omega-3 Fatty Acids (FISH OIL) 1000 MG CAPS Take 1-2 capsules by mouth See admin instructions. Takes one in the morning and two capsules at night.    Marland Kitchen omeprazole (PRILOSEC) 20 MG capsule TAKE ONE CAPSULE BY MOUTH EVERY DAY 90 capsule 3  . polyethylene glycol powder (GLYCOLAX/MIRALAX) powder MIX 17 G AND TAKE BY MOUTH DAILY. (Patient taking differently: MIX 17 G AND TAKE BY MOUTH DAILY AS NEEDED FOR CONSTIPATION) 527 g 12  . rOPINIRole (REQUIP) 0.25 MG tablet TAKE 0.25MG  BY MOUTH ONCE DAILY 90 tablet 3  . simvastatin (ZOCOR) 20 MG tablet Take 1 tablet (20 mg total) by mouth at  bedtime. 90 tablet 3  . traMADol (ULTRAM) 50 MG tablet TAKE 1 TABLET BY MOUTH TWICE A DAY AS NEEDED FOR PAIN 60 tablet 5  . traZODone (DESYREL) 100 MG tablet TAKE 1 TABLET (100 MG TOTAL) BY MOUTH AT BEDTIME. 90 tablet 3   No current facility-administered medications for this visit.     Allergies  Allergen Reactions  . Atorvastatin Other (See Comments)    Myalgias in legs  . Crestor [Rosuvastatin] Other (See Comments)    Myalgias in legs  . Morphine And Related Other (See Comments)    "Crazy thoughts, severe headache, sick"  . Prednisone     On two occasions has caused A fib    Past Medical History:  Diagnosis Date  . Arthritis    back  . Atrial fibrillation with rapid ventricular  response ([place]) 02/2014   a. CHA2DS2VASc = 6 -> on eliquis;  b. 02/2014 s/p DCCV;  c. 08/2014 Echo: EF 55-60%, Gr 2 DD, mild MR, triv AI.  Marland Kitchen CAD (coronary artery disease)    a. s/p CABG x 2 (LIMA->LAD, VG->Diag);  b. 08/2014 Cath: LM nl, LAD 90p, LCX nl, RCA nl/dominant, LIMA->LAD atretic, VG->D2 patent w/ retrograde filling of LAD, EF 55-60%-->Med Rx.  . Diverticulitis 02/21/2012  . GERD (gastroesophageal reflux disease)   . Hyperlipemia   . Hypertension   . Insomnia     Social History   Socioeconomic History  . Marital status: Widowed    Spouse name: Not on file  . Number of children: Not on file  . Years of education: Not on file  . Highest education level: Not on file  Occupational History  . Not on file  Social Needs  . Financial resource strain: Not on file  . Food insecurity:    Worry: Not on file    Inability: Not on file  . Transportation needs:    Medical: Not on file    Non-medical: Not on file  Tobacco Use  . Smoking status: Never Smoker  . Smokeless tobacco: Never Used  Substance and Sexual Activity  . Alcohol use: No  . Drug use: No  . Sexual activity: Never  Lifestyle  . Physical activity:    Days per week: Not on file    Minutes per session: Not on file  . Stress: Not on file  Relationships  . Social connections:    Talks on phone: Not on file    Gets together: Not on file    Attends religious service: Not on file    Active member of club or organization: Not on file    Attends meetings of clubs or organizations: Not on file    Relationship status: Not on file  . Intimate partner violence:    Fear of current or ex partner: Not on file    Emotionally abused: Not on file    Physically abused: Not on file    Forced sexual activity: Not on file  Other Topics Concern  . 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 "I believe Jesus died on the cross to save me [id number] my sins. I am saved by the grace of God." 05/11/2017   Other: "I have a good life and am happy. My family is there when I need help." 05/11/2017   L. Ducatte, RN, BSN                                                                                                   Family History  Problem Relation Age of Onset  . Parkinson's disease Mother   . Heart attack Brother   . Arthritis Brother   . Cancer Brother        Unknown  . Breast cancer Neg Hx      Review of Systems: General: negative for chills, fever, night sweats or weight changes.  Cardiovascular: negative for chest pain, dyspnea on exertion, edema, orthopnea, palpitations, paroxysmal nocturnal dyspnea or shortness of breath Dermatological: negative for rash Respiratory: negative for cough or wheezing Urologic: negative for hematuria Abdominal: negative for nausea, vomiting, diarrhea, bright red blood per rectum, melena, or hematemesis Neurologic: negative for visual changes, syncope, or dizziness All other systems reviewed and are otherwise negative except as noted above.    Blood pressure 122/64, pulse (!) 53, height 5\' 1"  (1.549 m), weight 133 lb 3.2 oz (60.4 kg).  General appearance: alert, cooperative and no distress Neck: no JVD Lungs: clear to auscultation bilaterally Heart: regular rate and rhythm Extremities: no edema Skin: pale, cool, dry Neurologic: Grossly normal  EKG NSR, SB 53  ASSESSMENT AND PLAN:   Paroxysmal atrial fibrillation (HCC) Recurrent PAF Dec 2018-placed on  Amiodarone. She remains symptom free in NSR. Her HR is still low despite decreasing her Amiodarone though she does not appear to be symptomatic. I considered stopping her Toprol but will ask Dr [name] for his input first.   Hx of CABG CABG x 2 2006- Cath in 2016 showed occluded LIMA to LAD with a patent SVG-Dx that filled the LAD. Myoview low risk Jan 2019  Long term (current) use of anticoagulants CHADS VASC=6- on Eliquis low dose based on weight and CRi  Hypertension Controlled  Hypertensive cardiomyopathy, without heart failure (HCC) Normal LVF with grade 2 DD  CKD (chronic kidney disease) stage 4, GFR 15-29 ml/min (HCC) GFR 27  Chronic diastolic CHF (congestive heart failure) (HCC) Has both diastolic dysfunction and right heart failure. Dry weight is 127-130   PLAN  Same Rx for now- I'll see what Dr [name] thinks about stopping her Toprol in the setting of asymptomatic bradycardia. She did bring in a log book from home that showed her HR consistently in the 50's, no 40's.   Kerin Ransom PA-C 04/19/2018 1:57 PM

## 2018-04-19 NOTE — Assessment & Plan Note (Signed)
GFR 27

## 2018-04-19 NOTE — Assessment & Plan Note (Signed)
Normal LVF with grade 2 DD

## 2018-04-19 NOTE — Patient Instructions (Addendum)
Medication Instructions:  Your physician recommends that you continue on your current medications as directed. Please refer to the Current Medication list given to you today.  If you need a refill on your cardiac medications before your next appointment, please call your pharmacy.  Labwork: noneocated in our office  Testing/Procedures: None   Follow-Up: KEEP UPCOMING APPTOINTMENT WITH DR C AS SCHEDULED  Any Other Special Instructions Will Be Listed Below (If Applicable).

## 2018-04-19 NOTE — Assessment & Plan Note (Signed)
Recurrent PAF Dec 2018-placed on Amiodarone. She remains symptom free in NSR. Her HR is still low despite decreasing her Amiodarone though she does not appear to be symptomatic. I considered stopping her Toprol but will ask Dr [name] for his input first.

## 2018-04-19 NOTE — Assessment & Plan Note (Signed)
Has both diastolic dysfunction and right heart failure. Dry weight is 127-130

## 2018-04-19 NOTE — Assessment & Plan Note (Signed)
CHADS VASC=6- on Eliquis low dose based on weight and CRi

## 2018-04-19 NOTE — Assessment & Plan Note (Signed)
Controlled.  

## 2018-04-19 NOTE — Progress Notes (Signed)
Yes, I think we should stop the Toprol.  Cut it in half for about a week, then stop altogether. MCr

## 2018-04-19 NOTE — Assessment & Plan Note (Signed)
CABG x 2 2006- Cath in 2016 showed occluded LIMA to LAD with a patent SVG-Dx that filled the LAD. Myoview low risk Jan 2019

## 2018-04-20 ENCOUNTER — Telehealth: Payer: Self-pay | Admitting: Cardiovascular Disease

## 2018-04-20 ENCOUNTER — Telehealth: Payer: Self-pay

## 2018-04-20 MED ORDER — METOPROLOL SUCCINATE ER 25 MG PO TB24: 12.5000 mg | ORAL_TABLET | Freq: Every day | ORAL | 0 refills | Status: DC

## 2018-04-20 NOTE — Telephone Encounter (Signed)
Spoke with pt. Adv pt that samples of Eliquis 2.5mg  will be left at the front desk for her to pick up. Pt voiced appreciation for the assistance.

## 2018-04-20 NOTE — Telephone Encounter (Signed)
New Message:    Patient calling the office for samples of medication:   1.  What medication and dosage are you requesting samples for?  apixaban (ELIQUIS) 2.5 MG TABS tablet  2.  Are you currently out of this medication? Yes

## 2018-04-20 NOTE — Telephone Encounter (Signed)
-----   Message from Erlene Quan, [place] sent at 04/19/2018  4:44 PM EDT ----- T please tell Jacqueline [name] I spoke with Dr [name] and he said for her to cut her Toprol in 1/2 for 5 days then stop it altogether.  Thanks  [employer]

## 2018-04-20 NOTE — Telephone Encounter (Signed)
Patient notified directly and voiced understanding.  

## 2018-05-13 ENCOUNTER — Other Ambulatory Visit: Payer: Self-pay | Admitting: Family Medicine

## 2018-05-17 ENCOUNTER — Ambulatory Visit: Payer: PPO

## 2018-05-21 ENCOUNTER — Other Ambulatory Visit: Payer: Self-pay | Admitting: Family Medicine

## 2018-05-21 DIAGNOSIS — F4329 Adjustment disorder with other symptoms: Secondary | ICD-10-CM

## 2018-06-13 ENCOUNTER — Telehealth: Payer: Self-pay | Admitting: Cardiovascular Disease

## 2018-06-13 NOTE — Telephone Encounter (Signed)
Spoke to patient .  Can not use  Any medication with sudafed brand/orgeneric.  May use  Brand/generic - Robitussin, mucinex ,  Coricidin,  dempsym    or antihistamine - Claritin , Allegra ,zyrtec  Patient verbalized understanding.

## 2018-06-13 NOTE — Telephone Encounter (Signed)
New Message          Patient is calling to see if she can take generic "Sudafed".

## 2018-06-17 ENCOUNTER — Other Ambulatory Visit: Payer: Self-pay | Admitting: Cardiovascular Disease

## 2018-07-07 ENCOUNTER — Other Ambulatory Visit: Payer: Self-pay | Admitting: Family Medicine

## 2018-07-07 ENCOUNTER — Telehealth: Payer: Self-pay | Admitting: Cardiovascular Disease

## 2018-07-07 ENCOUNTER — Ambulatory Visit: Payer: PPO | Admitting: Cardiovascular Disease

## 2018-07-07 ENCOUNTER — Encounter: Payer: Self-pay | Admitting: Cardiovascular Disease

## 2018-07-07 ENCOUNTER — Other Ambulatory Visit: Payer: Self-pay | Admitting: *Deleted

## 2018-07-07 VITALS — BP 124/70 | HR 73 | Ht 61.0 in | Wt 137.0 lb

## 2018-07-07 DIAGNOSIS — I10 Essential (primary) hypertension: Secondary | ICD-10-CM | POA: Diagnosis not present

## 2018-07-07 DIAGNOSIS — I2721 Secondary pulmonary arterial hypertension: Secondary | ICD-10-CM

## 2018-07-07 DIAGNOSIS — I5032 Chronic diastolic (congestive) heart failure: Secondary | ICD-10-CM | POA: Diagnosis not present

## 2018-07-07 DIAGNOSIS — N184 Chronic kidney disease, stage 4 (severe): Secondary | ICD-10-CM

## 2018-07-07 DIAGNOSIS — Z7901 Long term (current) use of anticoagulants: Secondary | ICD-10-CM

## 2018-07-07 DIAGNOSIS — E039 Hypothyroidism, unspecified: Secondary | ICD-10-CM

## 2018-07-07 DIAGNOSIS — I480 Paroxysmal atrial fibrillation: Secondary | ICD-10-CM

## 2018-07-07 DIAGNOSIS — Z5181 Encounter for therapeutic drug level monitoring: Secondary | ICD-10-CM

## 2018-07-07 DIAGNOSIS — I25728 Atherosclerosis of autologous artery coronary artery bypass graft(s) with other forms of angina pectoris: Secondary | ICD-10-CM | POA: Diagnosis not present

## 2018-07-07 DIAGNOSIS — E785 Hyperlipidemia, unspecified: Secondary | ICD-10-CM

## 2018-07-07 DIAGNOSIS — Z79899 Other long term (current) drug therapy: Secondary | ICD-10-CM | POA: Diagnosis not present

## 2018-07-07 MED ORDER — APIXABAN 2.5 MG PO TABS: 2.5000 mg | ORAL_TABLET | Freq: Two times a day (BID) | ORAL | 0 refills | Status: DC

## 2018-07-07 NOTE — Telephone Encounter (Signed)
Routed to [place]

## 2018-07-07 NOTE — Telephone Encounter (Signed)
New message   Pt called stating that she was in today and a paper was supposed to be faxed to her son about her Eliquis. She said she wanted to make sure the fax number was correct-[phone]. She said that pt son has not received it.

## 2018-07-07 NOTE — Patient Instructions (Signed)
Medication Instructions:  Dr [name] recommends that you continue on your current medications as directed. Please refer to the Current Medication list given to you today.  If you need a refill on your cardiac medications before your next appointment, please call your pharmacy.   Lab work: Your physician recommends that you return for lab work at your convenience - FASTING.  If you have labs (blood work) drawn today and your tests are completely normal, you will receive your results only by: Marland Kitchen MyChart Message (if you have MyChart) OR . A paper copy in the mail If you have any lab test that is abnormal or we need to change your treatment, we will call you to review the results.  Follow-Up: At [facility], you and your health needs are our priority.  As part of our continuing mission to provide you with exceptional heart care, we have created designated Provider Care Teams.  These Care Teams include your primary Cardiologist (physician) and Advanced Practice Providers (APPs -  Physician Assistants and Nurse Practitioners) who all work together to provide you with the care you need, when you need it. You will need a follow up appointment in 6 months. You may see Sanda Klein, MD or one of the following Advanced Practice Providers on your designated Care Team: [place], [place] . Fabian Sharp, PA-C . You will receive a reminder letter in the mail two months in advance. If you don't receive a letter, please call our office to schedule the follow-up appointment.

## 2018-07-07 NOTE — Progress Notes (Signed)
This was a bring dinner but she is patient ID: LAMA NARAYANAN, female   DOB: January 03, 1931, 84 y.o.   MRN: [id number]    Cardiology Office Note    Date:  07/08/2018   ID:  IREAN KENDRICKS, DOB 03/30/1931, MRN [id number]  PCP:  [name], MD  Cardiologist:   Sanda Klein, MD   Chief Complaint  Patient presents with  . Coronary Artery Disease  . Atrial Fibrillation  . Congestive Heart Failure    History of Present Illness:  ANYELY CUNNING is a 84 y.o. female with coronary artery disease and previous bypass surgery, paroxysmal atrial fibrillation requiring previous cardioversion, diastolic heart failure, HTN.  She has done well since her last appointment and thankfully has not had any further falls.  She has not had bleeding or other injuries.  She remains compliant with anticoagulation with Eliquis.  She has not had overt episodes of atrial fibrillation on a smallest possible dose of amiodarone 100 mg daily.  She has not had syncope, dizziness, palpitations.  She does have functional class II angina pectoris and dyspnea when "she overdoes it".  For example this happened on Thanksgiving when she was rushing around with preparations.  On usual days she does not have any angina or shortness of breath.  She reports that at home her weight is steady at around 130 pounds, although our office scale says 137 pounds today. At her last office appointment she weighed 132 pounds.  She is wearing winter clothes.  She was hospitalized in August 2018 with heart failure related to atrial flutter with rapid ventricular response.  She has known severe pulmonary artery hypertension. Right heart catheterization October 2016 showed a PA pressure of 77/28 (mean 49 mL Hg), explained at least in part by left heart failure with a mean wedge pressure of 27 mmHg (V wave 41 mmHg).  Coronary angiography (2016)showed atretic LIMA to LAD but patent SVG to diagonal and good distal flow. She has normal left ventricular  systolic function. She previously underwent electrical cardioversion for atrial fibrillation in 2015, anticoagulation was stopped in 2016 following an episode of GI bleeding.  She continues to live independently and fiercely values her autonomy.  One of her granddaughters used to live with her, but has since moved out to be closer to work.   Past Medical History:  Diagnosis Date  . Arthritis    back  . Atrial fibrillation with rapid ventricular response ([place]) 02/2014   a. CHA2DS2VASc = 6 -> on eliquis;  b. 02/2014 s/p DCCV;  c. 08/2014 Echo: EF 55-60%, Gr 2 DD, mild MR, triv AI.  Marland Kitchen CAD (coronary artery disease)    a. s/p CABG x 2 (LIMA->LAD, VG->Diag);  b. 08/2014 Cath: LM nl, LAD 90p, LCX nl, RCA nl/dominant, LIMA->LAD atretic, VG->D2 patent w/ retrograde filling of LAD, EF 55-60%-->Med Rx.  . Diverticulitis 02/21/2012  . GERD (gastroesophageal reflux disease)   . Hyperlipemia   . Hypertension   . Insomnia     Past Surgical History:  Procedure Laterality Date  . ABDOMINAL HYSTERECTOMY  1975  . CARDIAC CATHETERIZATION N/A 05/09/2015   Procedure: Right Heart Cath;  Surgeon: Larey Dresser, MD;  Location: [facility];  Service: Cardiovascular;  Laterality: N/A;  . CARDIOVERSION N/A 02/28/2014   Procedure: CARDIOVERSION;  Surgeon: Sanda Klein, MD;  Location: [facility];  Service: Cardiovascular;  Laterality: N/A;  . CATARACT EXTRACTION Bilateral 2014   with lens implanted  . COLONOSCOPY N/A 02/19/2015  Procedure: COLONOSCOPY;  Surgeon: Ladene Artist, MD;  Location: [facility] ENDOSCOPY;  Service: Endoscopy;  Laterality: N/A;  . CORONARY ARTERY BYPASS GRAFT  2006   off-pump bypass surgery with LIMA to the LAD and SVG to the second diagonal artery ( performed by Dr Servando Snare)   . JOINT REPLACEMENT Bilateral    L-2004, R-2006  . LEFT HEART CATHETERIZATION WITH CORONARY/GRAFT ANGIOGRAM N/A 09/17/2014   Procedure: LEFT HEART CATHETERIZATION WITH Beatrix Fetters;  Surgeon: Troy Sine, MD;  Location[facility];  Service: Cardiovascular;  Laterality: N/A;  . REIMPLANTATION OF TOTAL KNEE Right 10/08/2014   Procedure: REIMPLANTATION OF RIGHT TOTAL KNEE ARTHROPLASTY WITH REMOVAL OF ANTIBIOTIC SPACER;  Surgeon: Paralee Cancel, MD;  Location: [facility];  Service: Orthopedics;  Laterality: Right;  . ROTATOR CUFF REPAIR Bilateral    r-1999, l- 2005  . TEE WITHOUT CARDIOVERSION N/A 02/28/2014   Procedure: TRANSESOPHAGEAL ECHOCARDIOGRAM (TEE);  Surgeon: Sanda Klein, MD;  Location: [facility];  Service: Cardiovascular;  Laterality: N/A;  . TOTAL KNEE ARTHROPLASTY Right 07/02/2014   Procedure: Resection of Infected Right Total Knee Arthroplasty with placement antibiotic spacer;  Surgeon: Mauri Pole, MD;  Location: [facility];  Service: Orthopedics;  Laterality: Right;    Outpatient Medications Prior to Visit  Medication Sig Dispense Refill  . acetaminophen (TYLENOL) 500 MG tablet Take 500-1,000 mg by mouth every 6 (six) hours as needed for moderate pain or headache.    . ALPRAZolam (XANAX) 0.5 MG tablet TAKE 1 TABLET BY MOUTH TWICE A DAY AS NEEDED FOR ANXIETY 60 tablet 5  . amiodarone (PACERONE) 100 MG tablet Take 1 tablet (100 mg total) by mouth daily. 90 tablet 3  . furosemide (LASIX) 20 MG tablet TAKE 1 TABLET BY MOUTH EVERY DAY 90 tablet 3  . gabapentin (NEURONTIN) 100 MG capsule TAKE 1 CAPSULE (100 MG TOTAL) BY MOUTH 3 (THREE) TIMES DAILY. 270 capsule 3  . Iron-Vitamins (GERITOL COMPLETE PO) Take 1 tablet by mouth daily.    . isosorbide mononitrate (IMDUR) 30 MG 24 hr tablet Take 1 tablet (30 mg total) by mouth daily. 90 tablet 3  . levothyroxine (SYNTHROID, LEVOTHROID) 50 MCG tablet Take 1 tablet (50 mcg total) by mouth daily. 90 tablet 3  . nitroGLYCERIN (NITROSTAT) 0.4 MG SL tablet PLACE 1 TABLET (0.4 MG TOTAL) UNDER THE TONGUE EVERY 5 (FIVE) MINUTES AS NEEDED FOR CHEST PAIN. X 3 doses 25 tablet 1  . Omega-3 Fatty Acids (FISH OIL) 1000 MG CAPS Take 1-2 capsules by mouth See  admin instructions. Takes one in the morning and two capsules at night.    Marland Kitchen omeprazole (PRILOSEC) 20 MG capsule TAKE ONE CAPSULE BY MOUTH EVERY DAY 90 capsule 3  . polyethylene glycol powder (GLYCOLAX/MIRALAX) powder MIX 17 G AND TAKE BY MOUTH DAILY. (Patient taking differently: MIX 17 G AND TAKE BY MOUTH DAILY AS NEEDED FOR CONSTIPATION) 527 g 12  . rOPINIRole (REQUIP) 0.25 MG tablet TAKE 1 TABLET BY MOUTH 3 TIMES A DAY 270 tablet 3  . simvastatin (ZOCOR) 20 MG tablet Take 1 tablet (20 mg total) by mouth at bedtime. 90 tablet 3  . traMADol (ULTRAM) 50 MG tablet TAKE 1 TABLET BY MOUTH TWICE A DAY AS NEEDED FOR PAIN 60 tablet 5  . traZODone (DESYREL) 100 MG tablet TAKE 1 TABLET (100 MG TOTAL) BY MOUTH AT BEDTIME. 90 tablet 3  . apixaban (ELIQUIS) 2.5 MG TABS tablet Take 1 tablet (2.5 mg total) by mouth 2 (two) times daily. 180 tablet  3  . metoprolol succinate (TOPROL-XL) 25 MG 24 hr tablet Take 0.5 tablets (12.5 mg total) by mouth daily for 5 days. 5 tablet 0   No facility-administered medications prior to visit.      Allergies:   Atorvastatin; Crestor [rosuvastatin]; Morphine and related; and Prednisone   Social History   Socioeconomic History  . Marital status: Widowed    Spouse name: Not on file  . Number of children: Not on file  . Years of education: Not on file  . Highest education level: Not on file  Occupational History  . Not on file  Social Needs  . Financial resource strain: Not on file  . Food insecurity:    Worry: Not on file    Inability: Not on file  . Transportation needs:    Medical: Not on file    Non-medical: Not on file  Tobacco Use  . Smoking status: Never Smoker  . Smokeless tobacco: Never Used  Substance and Sexual Activity  . Alcohol use: No  . Drug use: No  . Sexual activity: Never  Lifestyle  . Physical activity:    Days per week: Not on file    Minutes per session: Not on file  . Stress: Not on file  Relationships  . Social connections:     Talks on phone: Not on file    Gets together: Not on file    Attends religious service: Not on file    Active member of club or organization: Not on file    Attends meetings of clubs or organizations: Not on file    Relationship status: Not on file  Other Topics Concern  . 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 "I believe Jesus died on the cross to save me [id number] my sins. I am saved by the grace of God." 05/11/2017   Other: "I have a good life and am happy. My family is there when I need help." 05/11/2017   L. Ducatte, RN, BSN                                                                                                   Family History:  The patient's family history includes Arthritis in her brother; Cancer in her brother; Heart attack in her brother; Parkinson's disease in her mother.   ROS:   Please see the history of present illness.    ROS All other systems are reviewed and are negative   PHYSICAL EXAM:   VS:  BP 124/70 (BP Location: Left Arm, Patient Position: Sitting, Cuff Size: Normal)   Pulse  73   Ht 5\' 1"  (1.549 m)   Wt 137 lb (62.1 kg)   BMI 25.89 kg/m       General: Alert, oriented x3, no distress, lean, appears comfortable Head: no evidence of trauma, PERRL, EOMI, no exophtalmos or lid lag, no myxedema, no xanthelasma; normal ears, nose and oropharynx Neck: normal jugular venous pulsations and no hepatojugular reflux; brisk carotid pulses without delay  and no carotid bruits Chest: clear to auscultation, no signs of consolidation by percussion or palpation, normal fremitus, symmetrical and full respiratory excursions Cardiovascular: normal position and quality of the apical impulse, regular rhythm, normal first and second heart sounds, no murmurs, rubs or gallops Abdomen: no tenderness or distention, no masses by palpation, no abnormal pulsatility or arterial bruits, normal bowel sounds, no hepatosplenomegaly Extremities: no clubbing, cyanosis or edema; 2+ radial, ulnar and brachial pulses bilaterally; 2+ right femoral, posterior tibial and dorsalis pedis pulses; 2+ left femoral, posterior tibial and dorsalis pedis pulses; no subclavian or femoral bruits Neurological: grossly nonfocal Psych: Normal mood and affect    Wt Readings from Last 3 Encounters:  07/07/18 137 lb (62.1 kg)  04/19/18 133 lb 3.2 oz (60.4 kg)  01/17/18 132 lb (59.9 kg)      Studies/Labs Reviewed:   EKG:  EKG is ordered today.  It shows normal sinus rhythm and is a completely normal tracing other than borderline QTC 451 ms.  Recent Labs: 07/22/2017: Magnesium 1.9 12/08/2017: ALT 19; BUN 22; Creatinine, Ser 1.73; Hemoglobin 11.9; Platelets 208; Potassium 4.7; Sodium 140; TSH 6.010   Lipid Panel    Component Value Date/Time   CHOL 149 04/08/2016 1153   TRIG 130 04/08/2016 1153   HDL 53 04/08/2016 1153   CHOLHDL 2.8 04/08/2016 1153   VLDL 26 04/08/2016 1153   LDLCALC 70 04/08/2016 1153    ASSESSMENT:     1. Paroxysmal atrial fibrillation (HCC)   2. Encounter for monitoring amiodarone therapy   3. Long term (current) use of anticoagulants   4. Coronary artery disease of autologous bypass graft with stable angina pectoris ([place])   5. Chronic diastolic CHF (congestive heart failure) (HCC)   6. Pulmonary arterial hypertension ([place])   7. CKD (chronic kidney disease) stage 4, GFR 15-29 ml/min (HCC)   8. Dyslipidemia   9. Essential hypertension   10. Medication  management      PLAN:  In order of problems listed above:  1. AFib/flutter: No clinically obvious recurrence.  On very low-dose amiodarone.  Tolerating anticoagulation.  CHADSVasc 5 (age 22, gender, CAD, CHF). 2. Amiodarone: Continue to monitor liver function tests and thyroid function tests every 6 months. 3. Eliquis: Remote history of GI bleeding when on anticoagulation, but without any bleeding in the recent past.  Anticoagulant dose is reduced based on age, renal function and low body size. 4. CAD: CCS functional class II with very infrequent episodes of angina pectoris. 5. CHF: She is paying close attention to sodium restriction and reports that her weight has been very steady. 6. PAH: Most recent echocardiogram from December 2018 reported normal pulmonary artery pressures.  If this is accurate, it would reinforce the suspicion that pulmonary hypertension is related to diastolic heart failure. 7. CKD: Monitor periodically and avoid excessive diuresis. Estimated creatinine clearance just under 30. 8. HLP: All lipid parameters within desirable range when last checked. On statin.  Target LDL less than 70. 9. HTN: Very well controlled, without recent episodes of orthostatic hypotension.   Medication Adjustments/Labs and Tests Ordered:  Current medicines are reviewed at length with the patient today.  Concerns regarding medicines are outlined above.  Medication changes, Labs and Tests ordered today are listed in the Patient Instructions below. Patient Instructions  Medication Instructions:  Dr [name] recommends that you continue on your current medications as directed. Please refer to the Current Medication list given to you today.  If you need a refill on your cardiac medications before your next appointment, please call your pharmacy.   Lab work: Your physician recommends that you return for lab work at your convenience - FASTING.  If you have labs (blood work) drawn today and your  tests are completely normal, you will receive your results only by: Marland Kitchen MyChart Message (if you have MyChart) OR . A paper copy in the mail If you have any lab test that is abnormal or we need to change your treatment, we will call you to review the results.  Follow-Up: At [facility], you and your health needs are our priority.  As part of our continuing mission to provide you with exceptional heart care, we have created designated Provider Care Teams.  These Care Teams include your primary Cardiologist (physician) and Advanced Practice Providers (APPs -  Physician Assistants and Nurse Practitioners) who all work together to provide you with the care you need, when you need it. You will need a follow up appointment in 6 months. You may see Sanda Klein, MD or one of the following Advanced Practice Providers on your designated Care Team: [place], [place] . Fabian Sharp, PA-C . You will receive a reminder letter in the mail two months in advance. If you don't receive a letter, please call our office to schedule the follow-up appointment.      Signed, Sanda Klein, MD  07/08/2018 4:54 PM    [address], [place], [place]  [address] Phone: [phone]; Fax: [phone]

## 2018-07-08 ENCOUNTER — Other Ambulatory Visit: Payer: PPO

## 2018-07-08 ENCOUNTER — Encounter: Payer: Self-pay | Admitting: Cardiovascular Disease

## 2018-07-08 DIAGNOSIS — Z5181 Encounter for therapeutic drug level monitoring: Secondary | ICD-10-CM

## 2018-07-08 DIAGNOSIS — E039 Hypothyroidism, unspecified: Secondary | ICD-10-CM | POA: Diagnosis not present

## 2018-07-08 DIAGNOSIS — Z79899 Other long term (current) drug therapy: Secondary | ICD-10-CM | POA: Insufficient documentation

## 2018-07-08 DIAGNOSIS — N184 Chronic kidney disease, stage 4 (severe): Secondary | ICD-10-CM

## 2018-07-09 LAB — BASIC METABOLIC PANEL
BUN/Creatinine Ratio: 12 (ref 12–28)
BUN: 29 mg/dL — ABNORMAL HIGH (ref 8–27)
CO2: 24 mmol/L (ref 20–29)
Calcium: 10.4 mg/dL — ABNORMAL HIGH (ref 8.7–10.3)
Chloride: 102 mmol/L (ref 96–106)
Creatinine, Ser: 2.51 mg/dL — ABNORMAL HIGH (ref 0.57–1.00)
GFR calc non Af Amer: 17 mL/min/{1.73_m2} — ABNORMAL LOW (ref >59)
GFR, EST AFRICAN AMERICAN: 20 mL/min/{1.73_m2} — AB (ref >59)
Glucose: 84 mg/dL (ref 65–99)
Potassium: 3.8 mmol/L (ref 3.5–5.2)
SODIUM: 145 mmol/L — AB (ref 134–144)

## 2018-07-09 LAB — LIPID PANEL
Chol/HDL Ratio: 2.9 ratio (ref 0.0–4.4)
Cholesterol, Total: 168 mg/dL (ref 100–199)
HDL: 58 mg/dL (ref >39)
LDL Calculated: 84 mg/dL (ref 0–99)
Triglycerides: 130 mg/dL (ref 0–149)
VLDL Cholesterol Cal: 26 mg/dL (ref 5–40)

## 2018-07-09 LAB — TSH: TSH: 5.77 u[IU]/mL — ABNORMAL HIGH (ref 0.450–4.500)

## 2018-07-11 ENCOUNTER — Other Ambulatory Visit: Payer: Self-pay

## 2018-07-11 ENCOUNTER — Ambulatory Visit (HOSPITAL_COMMUNITY)
Admission: RE | Admit: 2018-07-11 | Discharge: 2018-07-11 | Disposition: A | Discharge status: Home / Self Care | Payer: PPO | Source: Ambulatory Visit | Attending: Family Medicine | Admitting: Family Medicine

## 2018-07-11 DIAGNOSIS — R079 Chest pain, unspecified: Secondary | ICD-10-CM | POA: Diagnosis not present

## 2018-07-11 NOTE — Progress Notes (Signed)
Called with lab results - specifically worsening renal function.  Concerned that she is prerenal in part due to over diuresis.  Denies ankle swelling.   Advised to stop lasix and increase water intake.  Will repeat BMP later this week to see if improved.  If not, may need additional testing.

## 2018-07-11 NOTE — Assessment & Plan Note (Signed)
Stop lasix.  Repeat BMP in three days.

## 2018-07-12 MED ORDER — APIXABAN 2.5 MG PO TABS: 2.5000 mg | ORAL_TABLET | Freq: Two times a day (BID) | ORAL | 3 refills | Status: DC

## 2018-07-12 NOTE — Telephone Encounter (Signed)
Faxed provider portion of patient assistance application to [facility] [place] and to [employer].

## 2018-07-13 ENCOUNTER — Ambulatory Visit (INDEPENDENT_AMBULATORY_CARE_PROVIDER_SITE_OTHER): Payer: PPO | Admitting: Family Medicine

## 2018-07-13 ENCOUNTER — Other Ambulatory Visit: Payer: Self-pay

## 2018-07-13 ENCOUNTER — Encounter: Payer: Self-pay | Admitting: Family Medicine

## 2018-07-13 DIAGNOSIS — I5032 Chronic diastolic (congestive) heart failure: Secondary | ICD-10-CM | POA: Diagnosis not present

## 2018-07-13 DIAGNOSIS — N184 Chronic kidney disease, stage 4 (severe): Secondary | ICD-10-CM | POA: Diagnosis not present

## 2018-07-13 DIAGNOSIS — I25728 Atherosclerosis of autologous artery coronary artery bypass graft(s) with other forms of angina pectoris: Secondary | ICD-10-CM | POA: Diagnosis not present

## 2018-07-13 DIAGNOSIS — N390 Urinary tract infection, site not specified: Secondary | ICD-10-CM | POA: Diagnosis not present

## 2018-07-13 DIAGNOSIS — N179 Acute kidney failure, unspecified: Secondary | ICD-10-CM | POA: Diagnosis not present

## 2018-07-13 LAB — POCT URINALYSIS DIP (MANUAL ENTRY)
Bilirubin, UA: NEGATIVE
Blood, UA: NEGATIVE
GLUCOSE UA: NEGATIVE mg/dL
Ketones, POC UA: NEGATIVE mg/dL
Nitrite, UA: NEGATIVE
Protein Ur, POC: NEGATIVE mg/dL
Spec Grav, UA: 1.01 (ref 1.010–1.025)
Urobilinogen, UA: 0.2 E.U./dL
pH, UA: 7 (ref 5.0–8.0)

## 2018-07-13 LAB — POCT UA - MICROSCOPIC ONLY

## 2018-07-13 NOTE — Assessment & Plan Note (Addendum)
See also CKD.  Bump due to over diuresis?  Not much improvement in creat.  Will check renal ultrasound.

## 2018-07-13 NOTE — Assessment & Plan Note (Signed)
While the six hours of chest pain is concerning, the normal EKG and lack of pain since is reassuring.  Will continue to observe.  She knows to go to the ER if chest pain recurs.

## 2018-07-13 NOTE — Patient Instructions (Signed)
I will call tomorrow with the test results.  We will talk then about what to do next. Tell them to find me next Monday when you have the wellness test.  I want to make sure you continue to improve. Tell my colleague, Dr Janalyn Rouse, that I will run the urine test.   Merry Christmas.

## 2018-07-13 NOTE — Assessment & Plan Note (Signed)
Hopefully the creat bump was due to over diuresis.  Repeat today.

## 2018-07-13 NOTE — Assessment & Plan Note (Addendum)
Seems stable off furosemide.Marland Kitchen

## 2018-07-13 NOTE — Progress Notes (Signed)
Acute Office Visit  Subjective:    Patient ID: Jacqueline [name], female    DOB: 07/25/1931, 84 y.o.   MRN: [id number]  Chief Complaint  Patient presents with  . Follow-up    HPI Patient is in today for acute illness and worsening renal function. 1. Patient was feeling great and had just been to cardiology until 5 days ago.  Last Saturday, she had 6 hours of chest pain, substernal, non radiating.  Since then has had no chest pain but has felt washed out.  No fever, cough or dysuria, etc.  Nothing to suggest an acute infection.  She came to our office 3 days ago for an annual wellness exam.  The nurse got me because of this chest pain story.  She was pain free, had a normal exam and a normal EKG.  Just tired.  She returns today for further check.  She remains chest pain free and is definitely improving.  She is not yet back to normal.  Again denies symptoms of acute infection. 2. Bump in creat on most recent labs done by cards.  Based on those results and a lack of swelling or orthopnea, I asked her to stop her furosemide, which she has done x 4 days.  I reviewed meds - nothing nephrotoxic.  Denies leg swelling since she stopped her lasix.  Past Medical History:  Diagnosis Date  . Arthritis    back  . Atrial fibrillation with rapid ventricular response ([place]) 02/2014   a. CHA2DS2VASc = 6 -> on eliquis;  b. 02/2014 s/p DCCV;  c. 08/2014 Echo: EF 55-60%, Gr 2 DD, mild MR, triv AI.  Marland Kitchen CAD (coronary artery disease)    a. s/p CABG x 2 (LIMA->LAD, VG->Diag);  b. 08/2014 Cath: LM nl, LAD 90p, LCX nl, RCA nl/dominant, LIMA->LAD atretic, VG->D2 patent w/ retrograde filling of LAD, EF 55-60%-->Med Rx.  . Diverticulitis 02/21/2012  . GERD (gastroesophageal reflux disease)   . Hyperlipemia   . Hypertension   . Insomnia     Past Surgical History:  Procedure Laterality Date  . ABDOMINAL HYSTERECTOMY  1975  . CARDIAC CATHETERIZATION N/A 05/09/2015   Procedure: Right Heart Cath;  Surgeon: Larey Dresser,  MD;  Location: [place] CV LAB;  Service: Cardiovascular;  Laterality: N/A;  . CARDIOVERSION N/A 02/28/2014   Procedure: CARDIOVERSION;  Surgeon: Sanda Klein, MD;  Location: [place] ENDOSCOPY;  Service: Cardiovascular;  Laterality: N/A;  . CATARACT EXTRACTION Bilateral 2014   with lens implanted  . COLONOSCOPY N/A 02/19/2015   Procedure: COLONOSCOPY;  Surgeon: Ladene Artist, MD;  Location: [facility] ENDOSCOPY;  Service: Endoscopy;  Laterality: N/A;  . CORONARY ARTERY BYPASS GRAFT  2006   off-pump bypass surgery with LIMA to the LAD and SVG to the second diagonal artery ( performed by Dr Servando Snare)   . JOINT REPLACEMENT Bilateral    L-2004, R-2006  . LEFT HEART CATHETERIZATION WITH CORONARY/GRAFT ANGIOGRAM N/A 09/17/2014   Procedure: LEFT HEART CATHETERIZATION WITH Beatrix Fetters;  Surgeon: Troy Sine, MD;  Location: [facility] CATH LAB;  Service: Cardiovascular;  Laterality: N/A;  . REIMPLANTATION OF TOTAL KNEE Right 10/08/2014   Procedure: REIMPLANTATION OF RIGHT TOTAL KNEE ARTHROPLASTY WITH REMOVAL OF ANTIBIOTIC SPACER;  Surgeon: Paralee Cancel, MD;  Location: [facility] ORS;  Service: Orthopedics;  Laterality: Right;  . ROTATOR CUFF REPAIR Bilateral    r-1999, l- 2005  . TEE WITHOUT CARDIOVERSION N/A 02/28/2014   Procedure: TRANSESOPHAGEAL ECHOCARDIOGRAM (TEE);  Surgeon: Sanda Klein, MD;  Location: [facility]  ENDOSCOPY;  Service: Cardiovascular;  Laterality: N/A;  . TOTAL KNEE ARTHROPLASTY Right 07/02/2014   Procedure: Resection of Infected Right Total Knee Arthroplasty with placement antibiotic spacer;  Surgeon: Mauri Pole, MD;  Location: [facility] ORS;  Service: Orthopedics;  Laterality: Right;    Family History  Problem Relation Age of Onset  . Parkinson's disease Mother   . Heart attack Brother   . Arthritis Brother   . Cancer Brother        Unknown  . Breast cancer Neg Hx     Social History   Socioeconomic History  . Marital status: Widowed    Spouse name: Not on file  . Number of children: Not  on file  . Years of education: Not on file  . Highest education level: Not on file  Occupational History  . Not on file  Social Needs  . Financial resource strain: Not on file  . Food insecurity:    Worry: Not on file    Inability: Not on file  . Transportation needs:    Medical: Not on file    Non-medical: Not on file  Tobacco Use  . Smoking status: Never Smoker  . Smokeless tobacco: Never Used  Substance and Sexual Activity  . Alcohol use: No  . Drug use: No  . Sexual activity: Never  Lifestyle  . Physical activity:    Days per week: Not on file    Minutes per session: Not on file  . Stress: Not on file  Relationships  . Social connections:    Talks on phone: Not on file    Gets together: Not on file    Attends religious service: Not on file    Active member of club or organization: Not on file    Attends meetings of clubs or organizations: Not on file    Relationship status: Not on file  . Intimate partner violence:    Fear of current or ex partner: Not on file    Emotionally abused: Not on file    Physically abused: Not on file    Forced sexual activity: Not on file  Other Topics Concern  . 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 "I believe Jesus died on the cross to save me [id number] my sins. I am saved by the grace of God." 05/11/2017   Other: "I have a good life and am happy. My family is there when I need help." 05/11/2017   L. Silvano Rusk, RN, BSN  Outpatient Medications Prior to Visit  Medication Sig Dispense Refill  . acetaminophen (TYLENOL) 500 MG tablet Take 500-1,000 mg by mouth every 6 (six) hours as needed for moderate pain or headache.    . ALPRAZolam (XANAX) 0.5 MG tablet TAKE 1 TABLET BY MOUTH TWICE A DAY AS NEEDED FOR ANXIETY 60 tablet 5  . amiodarone (PACERONE) 100 MG tablet Take 1 tablet (100 mg total) by mouth daily. 90 tablet 3  . apixaban (ELIQUIS) 2.5 MG TABS tablet Take 1 tablet (2.5 mg total) by mouth 2 (two) times daily. 180 tablet 3  . furosemide (LASIX) 20 MG tablet TAKE 1 TABLET BY MOUTH EVERY DAY 90 tablet 3  . gabapentin (NEURONTIN) 100 MG capsule TAKE 1 CAPSULE (100 MG TOTAL) BY MOUTH 3 (THREE) TIMES DAILY. 270 capsule 3  . Iron-Vitamins (GERITOL COMPLETE PO) Take 1 tablet by mouth daily.    . isosorbide mononitrate (IMDUR) 30 MG 24 hr tablet Take 1 tablet (30 mg total) by mouth daily. 90 tablet 3  . levothyroxine (SYNTHROID, LEVOTHROID) 50 MCG tablet Take 1 tablet (50 mcg total) by mouth daily. 90 tablet 3  . metoprolol succinate (TOPROL-XL) 25 MG 24 hr tablet Take 0.5 tablets (12.5 mg total) by mouth daily for 5 days. 5 tablet 0  . nitroGLYCERIN (NITROSTAT) 0.4 MG SL tablet PLACE 1 TABLET (0.4 MG TOTAL) UNDER THE TONGUE EVERY 5 (FIVE) MINUTES AS NEEDED FOR CHEST PAIN. X 3 doses 25 tablet 1  . Omega-3 Fatty Acids (FISH OIL) 1000 MG CAPS Take 1-2 capsules by mouth See admin instructions. Takes one in the morning and two capsules at night.    Marland Kitchen omeprazole (PRILOSEC) 20 MG capsule TAKE ONE CAPSULE BY MOUTH EVERY DAY 90 capsule 3  . polyethylene glycol powder (GLYCOLAX/MIRALAX) powder MIX 17 G AND TAKE BY MOUTH DAILY. (Patient taking differently: MIX 17 G AND TAKE BY MOUTH DAILY AS NEEDED FOR CONSTIPATION) 527 g 12  . rOPINIRole (REQUIP) 0.25 MG tablet TAKE 1 TABLET BY MOUTH 3 TIMES A DAY 270 tablet 3  . simvastatin (ZOCOR) 20 MG  tablet Take 1 tablet (20 mg total) by mouth at bedtime. 90 tablet 3  . traMADol (ULTRAM) 50 MG tablet TAKE 1 TABLET BY MOUTH TWICE A DAY AS NEEDED FOR PAIN 60 tablet 5  . traZODone (DESYREL) 100 MG tablet TAKE 1 TABLET (100 MG TOTAL) BY MOUTH AT BEDTIME. 90 tablet 3   No facility-administered medications prior to visit.     Allergies  Allergen Reactions  . Atorvastatin Other (See Comments)    Myalgias in legs  . Crestor [Rosuvastatin] Other (See Comments)    Myalgias in legs  . Morphine And Related Other (See Comments)    "Crazy thoughts, severe headache, sick"  . Prednisone     On two occasions has caused A fib    ROS     Objective:    Physical Exam  BP 122/65   Pulse 67   Temp 97.6 F (36.4 C) (Oral)   Wt 138 lb (62.6 kg)   SpO2 96%   BMI 26.07 kg/m  Wt Readings from Last 3 Encounters:  07/13/18 138 lb (62.6 kg)  07/07/18 137 lb (62.1 kg)  04/19/18 133 lb 3.2 oz (60.4 kg)   Wts noted.  No jump since stopping lasix. Lungs clear Cardiac RRR without m or g Abd benign. No peripheral edema. Health Maintenance Due  Topic Date Due  . INFLUENZA VACCINE  02/24/2018    There are no preventive care  reminders to display for this patient.   Lab Results  Component Value Date   TSH 5.770 (H) 07/08/2018   Lab Results  Component Value Date   WBC 4.1 12/08/2017   HGB 11.9 12/08/2017   HCT 37.4 12/08/2017   MCV 97 12/08/2017   PLT 208 12/08/2017   Lab Results  Component Value Date   NA 145 (H) 07/08/2018   K 3.8 07/08/2018   CO2 24 07/08/2018   GLUCOSE 84 07/08/2018   BUN 29 (H) 07/08/2018   CREATININE 2.51 (H) 07/08/2018   BILITOT 0.3 12/08/2017   ALKPHOS 72 12/08/2017   AST 23 12/08/2017   ALT 19 12/08/2017   PROT 6.9 12/08/2017   ALBUMIN 4.6 12/08/2017   CALCIUM 10.4 (H) 07/08/2018   ANIONGAP 10 09/13/2017   Lab Results  Component Value Date   CHOL 168 07/08/2018   Lab Results  Component Value Date   HDL 58 07/08/2018   Lab Results    Component Value Date   LDLCALC 84 07/08/2018   Lab Results  Component Value Date   TRIG 130 07/08/2018   Lab Results  Component Value Date   CHOLHDL 2.9 07/08/2018   Lab Results  Component Value Date   HGBA1C 5.8 (H) 07/23/2017       Assessment & Plan:   Problem List Items Addressed This Visit    AKI (acute kidney injury) ([place])   Relevant Orders   CMP14+EGFR   POCT urinalysis dipstick (Completed)   POCT UA - Microscopic Only (Completed)       No orders of the defined types were placed in this encounter.    [name], MD

## 2018-07-14 ENCOUNTER — Other Ambulatory Visit: Payer: Self-pay | Admitting: Family Medicine

## 2018-07-14 ENCOUNTER — Other Ambulatory Visit: Payer: PPO

## 2018-07-14 DIAGNOSIS — N390 Urinary tract infection, site not specified: Secondary | ICD-10-CM | POA: Insufficient documentation

## 2018-07-14 LAB — CMP14+EGFR
ALBUMIN: 4.5 g/dL (ref 3.5–4.7)
ALT: 25 IU/L (ref 0–32)
AST: 32 IU/L (ref 0–40)
Albumin/Globulin Ratio: 2.5 — ABNORMAL HIGH (ref 1.2–2.2)
Alkaline Phosphatase: 69 IU/L (ref 39–117)
BUN/Creatinine Ratio: 11 — ABNORMAL LOW (ref 12–28)
BUN: 27 mg/dL (ref 8–27)
Bilirubin Total: <0.2 mg/dL (ref 0.0–1.2)
CALCIUM: 9.9 mg/dL (ref 8.7–10.3)
CO2: 23 mmol/L (ref 20–29)
Chloride: 100 mmol/L (ref 96–106)
Creatinine, Ser: 2.44 mg/dL — ABNORMAL HIGH (ref 0.57–1.00)
GFR calc Af Amer: 20 mL/min/{1.73_m2} — ABNORMAL LOW (ref >59)
GFR calc non Af Amer: 18 mL/min/{1.73_m2} — ABNORMAL LOW (ref >59)
Globulin, Total: 1.8 g/dL (ref 1.5–4.5)
Glucose: 85 mg/dL (ref 65–99)
Potassium: 4.4 mmol/L (ref 3.5–5.2)
Sodium: 138 mmol/L (ref 134–144)
Total Protein: 6.3 g/dL (ref 6.0–8.5)

## 2018-07-14 MED ORDER — CEPHALEXIN 500 MG PO CAPS: 500.0000 mg | ORAL_CAPSULE | Freq: Two times a day (BID) | ORAL | 0 refills | Status: DC

## 2018-07-14 NOTE — Assessment & Plan Note (Signed)
Possible uncomplicated UTI contributing to sx.  Will rx

## 2018-07-14 NOTE — Telephone Encounter (Signed)
Tramadol transmission failed.  Called in verbally to pharmacy VM. Ana Liaw, Salome Spotted, CMA

## 2018-07-14 NOTE — Addendum Note (Signed)
Addended by: [name] on: 07/14/2018 10:54 AM   Modules accepted: Orders

## 2018-07-18 ENCOUNTER — Ambulatory Visit (INDEPENDENT_AMBULATORY_CARE_PROVIDER_SITE_OTHER): Payer: PPO

## 2018-07-18 VITALS — BP 148/80 | HR 64 | Temp 97.7°F | Ht 61.0 in | Wt 138.0 lb

## 2018-07-18 DIAGNOSIS — Z23 Encounter for immunization: Secondary | ICD-10-CM | POA: Diagnosis not present

## 2018-07-18 DIAGNOSIS — Z0000 Encounter for general adult medical examination without abnormal findings: Secondary | ICD-10-CM | POA: Diagnosis not present

## 2018-07-18 MED ORDER — ROPINIROLE HCL 0.25 MG PO TABS: ORAL_TABLET | ORAL | 3 refills | Status: DC

## 2018-07-18 NOTE — Telephone Encounter (Signed)
Pt called nurse line stating the pharmacy still does not have her tramadol rx. Pt would also like a refill on her requip.   I called pharmacy and her Tramadol is ready for pick up. I will call and inform her.   Please advise the Requip.

## 2018-07-18 NOTE — Progress Notes (Signed)
Patient ID: Jacqueline [name], female   DOB: [birth year], 84 y.o.   MRN: [id number] I have reviewed and agree with this documentation.

## 2018-07-18 NOTE — Progress Notes (Signed)
Subjective:   Jacqueline [name] is a 84 y.o. female who presents for Medicare Annual (Subsequent) preventive examination.  Review of Systems:  Physical assessment deferred to PCP.  Cardiac Risk Factors include: advanced age (>19men, >12 women);hypertension    Objective:    Vitals: BP (!) 148/80   Pulse 64   Temp 97.7 F (36.5 C) (Oral)   Ht 5\' 1"  (1.549 m)   Wt 138 lb (62.6 kg)   SpO2 94%   BMI 26.07 kg/m   Body mass index is 26.07 kg/m.  Advanced Directives 07/18/2018 07/13/2018 12/08/2017 09/15/2017 07/29/2017 07/22/2017 07/21/2017  Does Patient Have a Medical Advance Directive? - No No No No - No  Type of Advance Directive - - - [place];Living will [place];Living will - -  Does patient want to make changes to medical advance directive? - - - No - Patient declined No - Patient declined - -  Copy of [place] in Chart? - - - No - copy requested No - copy requested - -  Would patient like information on creating a medical advance directive? No - Patient declined No - Patient declined No - Patient declined No - Patient declined No - Patient declined;Yes (Inpatient - patient defers creating a medical advance directive at this time) No - Patient declined;Yes (Inpatient - patient defers creating a medical advance directive at this time) No - Patient declined  Pre-existing out of facility DNR order (yellow form or pink MOST form) - - - - - - -   Tobacco Social History   Tobacco Use  Smoking Status Never Smoker  Smokeless Tobacco Never Used     Counseling given: Not Answered  Clinical Intake:  Pre-visit preparation completed: No  Pain : No/denies pain Pain Score: 0-No pain    Nutritional Status: BMI 25 -29 Overweight Nutritional Risks: None Diabetes: No  How often do you need to have someone help you when you read instructions, pamphlets, or other written materials from your doctor or pharmacy?: 1 - Never What is  the last grade level you completed in school?: 10th grade  Interpreter Needed?: No    Past Medical History:  Diagnosis Date  . Arthritis    back  . Atrial fibrillation with rapid ventricular response ([place]) 02/2014   a. CHA2DS2VASc = 6 -> on eliquis;  b. 02/2014 s/p DCCV;  c. 08/2014 Echo: EF 55-60%, Gr 2 DD, mild MR, triv AI.  Marland Kitchen CAD (coronary artery disease)    a. s/p CABG x 2 (LIMA->LAD, VG->Diag);  b. 08/2014 Cath: LM nl, LAD 90p, LCX nl, RCA nl/dominant, LIMA->LAD atretic, VG->D2 patent w/ retrograde filling of LAD, EF 55-60%-->Med Rx.  . Diverticulitis 02/21/2012  . GERD (gastroesophageal reflux disease)   . Hyperlipemia   . Hypertension   . Insomnia    Past Surgical History:  Procedure Laterality Date  . ABDOMINAL HYSTERECTOMY  1975  . CARDIAC CATHETERIZATION N/A 05/09/2015   Procedure: Right Heart Cath;  Surgeon: Larey Dresser, MD;  Location: [place] CV LAB;  Service: Cardiovascular;  Laterality: N/A;  . CARDIOVERSION N/A 02/28/2014   Procedure: CARDIOVERSION;  Surgeon: Sanda Klein, MD;  Location: [place] ENDOSCOPY;  Service: Cardiovascular;  Laterality: N/A;  . CATARACT EXTRACTION Bilateral 2014   with lens implanted  . COLONOSCOPY N/A 02/19/2015   Procedure: COLONOSCOPY;  Surgeon: Ladene Artist, MD;  Location: [facility] ENDOSCOPY;  Service: Endoscopy;  Laterality: N/A;  . CORONARY ARTERY BYPASS GRAFT  2006   off-pump bypass surgery with LIMA to the LAD and SVG to the second diagonal artery ( performed by Dr Servando Snare)   . JOINT REPLACEMENT Bilateral    L-2004, R-2006  . LEFT HEART CATHETERIZATION WITH CORONARY/GRAFT ANGIOGRAM N/A 09/17/2014   Procedure: LEFT HEART CATHETERIZATION WITH Beatrix Fetters;  Surgeon: Troy Sine, MD;  Location: [facility] CATH LAB;  Service: Cardiovascular;  Laterality: N/A;  . REIMPLANTATION OF TOTAL KNEE Right 10/08/2014   Procedure: REIMPLANTATION OF RIGHT TOTAL KNEE ARTHROPLASTY WITH REMOVAL OF ANTIBIOTIC SPACER;  Surgeon: Paralee Cancel, MD;   Location: [facility];  Service: Orthopedics;  Laterality: Right;  . ROTATOR CUFF REPAIR Bilateral    r-1999, l- 2005  . TEE WITHOUT CARDIOVERSION N/A 02/28/2014   Procedure: TRANSESOPHAGEAL ECHOCARDIOGRAM (TEE);  Surgeon: Sanda Klein, MD;  Location: [place];  Service: Cardiovascular;  Laterality: N/A;  . TOTAL KNEE ARTHROPLASTY Right 07/02/2014   Procedure: Resection of Infected Right Total Knee Arthroplasty with placement antibiotic spacer;  Surgeon: Mauri Pole, MD;  Location: [facility] ORS;  Service: Orthopedics;  Laterality: Right;   Family History  Problem Relation Age of Onset  . Parkinson's disease Mother   . Heart attack Brother   . Arthritis Brother   . Cancer Brother        Unknown  . Breast cancer Neg Hx    Social History   Socioeconomic History  . Marital status: Widowed    Spouse name: Not on file  . Number of children: 2  . Years of education: 10  . Highest education level: 10th grade  Occupational History  . Not on file  Social Needs  . Financial resource strain: Not very hard  . Food insecurity:    Worry: Never true    Inability: Never true  . Transportation needs:    Medical: No    Non-medical: No  Tobacco Use  . Smoking status: Never Smoker  . Smokeless tobacco: Never Used  Substance and Sexual Activity  . Alcohol use: No  . Drug use: No  . Sexual activity: Not Currently    Birth control/protection: Post-menopausal  Lifestyle  . Physical activity:    Days per week: 7 days    Minutes per session: 10 min  . Stress: Not at all  Relationships  . Social connections:    Talks on phone: More than three times a week    Gets together: More than three times a week    Attends religious service: More than 4 times per year    Active member of club or organization: No    Attends meetings of clubs or organizations: Never    Relationship status: Widowed  Other Topics Concern  . 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 Drinks water and one cup of coffee daily.   Religious / Personal Beliefs: Baptist. "I believe Jesus died on the cross to save me [id number] my sins. I am saved by the grace of God."  Other: "I have a good life and am happy. My family is there when I need help."                                                               Outpatient Encounter Medications as of 07/18/2018  Medication Sig  . acetaminophen (TYLENOL) 500 MG tablet Take 500-1,000 mg by mouth every 6 (six) hours as needed for moderate pain or headache.  . ALPRAZolam (XANAX) 0.5 MG tablet TAKE 1 TABLET BY MOUTH TWICE A DAY AS NEEDED FOR ANXIETY  . amiodarone (PACERONE) 100 MG tablet Take 1 tablet (100 mg total) by mouth daily.  Marland Kitchen apixaban (ELIQUIS) 2.5 MG TABS tablet Take 1 tablet (2.5 mg total) by mouth 2 (two) times daily.  . furosemide (LASIX) 20 MG tablet TAKE 1 TABLET BY MOUTH EVERY DAY  . gabapentin (NEURONTIN) 100 MG capsule TAKE 1 CAPSULE (100 MG TOTAL) BY MOUTH 3 (THREE) TIMES DAILY.  Marland Kitchen Iron-Vitamins (GERITOL COMPLETE PO) Take 1 tablet by mouth daily.  . isosorbide mononitrate (IMDUR) 30 MG 24 hr tablet Take 1 tablet (30 mg total) by mouth daily.  Marland Kitchen levothyroxine (SYNTHROID, LEVOTHROID) 50 MCG tablet Take 1 tablet (50 mcg total) by mouth daily.  . nitroGLYCERIN (NITROSTAT) 0.4 MG SL tablet PLACE 1 TABLET (0.4 MG TOTAL) UNDER THE TONGUE EVERY 5 (FIVE) MINUTES AS NEEDED FOR CHEST PAIN. X 3 doses  . Omega-3 Fatty Acids (FISH OIL) 1000 MG CAPS Take 1-2 capsules by mouth See admin instructions. Takes one in the  morning and two capsules at night.  Marland Kitchen omeprazole (PRILOSEC) 20 MG capsule TAKE ONE CAPSULE BY MOUTH EVERY DAY  . polyethylene glycol powder (GLYCOLAX/MIRALAX) powder MIX 17 G AND TAKE BY MOUTH DAILY. (Patient taking differently: MIX 17 G AND TAKE BY MOUTH DAILY AS NEEDED FOR CONSTIPATION)  . rOPINIRole (REQUIP) 0.25 MG tablet TAKE 1 TABLET BY MOUTH 3 TIMES A DAY  . simvastatin (ZOCOR) 20 MG tablet Take 1 tablet (20 mg total) by mouth at bedtime.  . traMADol (ULTRAM) 50 MG tablet TAKE 1 TABLET BY MOUTH TWICE A DAY AS NEEDED FOR PAIN  . traZODone (DESYREL) 100 MG tablet TAKE 1 TABLET (100 MG TOTAL) BY MOUTH AT BEDTIME.  . cephALEXin (KEFLEX) 500 MG capsule Take 1 capsule (500 mg total) by mouth 2 (two) times daily. (Patient not taking: Reported on 07/18/2018)  . metoprolol succinate (TOPROL-XL) 25 MG 24 hr tablet Take 0.5 tablets (12.5 mg total) by mouth daily for 5 days.   No facility-administered encounter medications on file as of 07/18/2018.     Activities of Daily Living In your present state of health, do you have any difficulty performing the following activities: 07/18/2018 07/22/2017  Hearing? N N  Vision? N N  Comment eye glasses up to date -  Difficulty concentrating or making decisions? N N  Walking or climbing stairs? N N  Dressing or bathing? N N  Doing errands, shopping? N N  Preparing Food and eating ? N -  Using the Toilet? N -  In the past six months, have you accidently leaked urine? N -  Do you have problems with loss of bowel control? N -  Managing your Medications? N -  Managing your Finances? N -  Housekeeping or managing your  Housekeeping? N -  Some recent data might be hidden    Patient Care Team: [name], MD as PCP - General Croitoru, Dani Gobble, MD as Consulting Physician (Cardiology) Darleen Crocker, MD as Consulting Physician (Ophthalmology)    Assessment:   This is a routine wellness examination for [place].  Exercise Activities and Dietary  recommendations Current Exercise Habits: Home exercise routine, Type of exercise: walking, Time (Minutes): 10, Frequency (Times/Week): 7, Weekly Exercise (Minutes/Week): 70, Intensity: Mild, Exercise limited by: Other - see comments  Goals      Patient Stated   . Maintain current level of physical activity (pt-stated)     Walking 20 minutes daily      Other   . Blood Pressure < 150/90       Fall Risk Fall Risk  07/18/2018 07/13/2018 12/08/2017 09/15/2017 07/29/2017  Falls in the past year? 0 0 Yes No No  Comment - - - - -  Number falls in past yr: - - - - -  Comment - - - - -  Injury with Fall? - - No - -  Risk for fall due to : - - - - -  Risk for fall due to: Comment - - - - -  Follow up - - - - -   Is the patient's home free of loose throw rugs in walkways, pet beds, electrical cords, etc?   yes      Grab bars in the bathroom? yes      Handrails on the stairs?   yes      Adequate lighting?   yes   Depression Screen PHQ 2/9 Scores 07/18/2018 07/13/2018 12/08/2017 09/15/2017  PHQ - 2 Score 0 0 0 0     Cognitive Function MMSE - Mini Mental State Exam 07/18/2018  Orientation to time 5  Orientation to Place 5  Registration 3  Attention/ Calculation 5  Recall 3  Language- name 2 objects 2  Language- repeat 1  Language- follow 3 step command 3  Language- read & follow direction 1  Write a sentence 1  Copy design 1  Total score 30     6CIT Screen 07/18/2018  What Year? 0 points  What month? 0 points  What time? 0 points  Count back from 20 0 points  Months in reverse 0 points  Repeat phrase 0 points  Total Score 0    Immunization History  Administered Date(s) Administered  . Influenza Split 05/01/2011, 04/19/2012  . Influenza Whole 05/05/2007, 04/20/2008, 05/16/2008, 05/02/2009, 04/23/2010  . Influenza,inj,Quad PF,6+ Mos 05/04/2013, 05/09/2014, 03/27/2015, 04/08/2016, 03/24/2017, 07/18/2018  . Pneumococcal Conjugate-13 12/22/2013  . Pneumococcal  Polysaccharide-23 10/25/1997, 03/04/2012  . Td 10/25/1997, 12/30/2007   Screening Tests Health Maintenance  Topic Date Due  . TETANUS/TDAP  07/12/2019 (Originally 12/29/2017)  . INFLUENZA VACCINE  Completed  . DEXA SCAN  Completed  . PNA vac Low Risk Adult  Completed   Cancer Screenings: Lung: Low Dose CT Chest recommended if Age 67-80 years, 30 pack-year currently smoking OR have quit w/in 15years. Patient does not qualify. Breast:  Up to date on Mammogram? Yes   Up to date of Bone Density/Dexa? Yes Colorectal: N/A  Additional Screenings: : Hepatitis C Screening: N/A    Plan:  All health maintenance up to date. Flu vaccine given today.    I have personally reviewed and noted the following in the patient's chart:   . Medical and social history . Use of alcohol, tobacco or illicit drugs  .  Current medications and supplements . Functional ability and status . Nutritional status . Physical activity . Advanced directives . List of other physicians . Hospitalizations, surgeries, and ER visits in previous 12 months . Vitals . Screenings to include cognitive, depression, and falls . Referrals and appointments  In addition, I have reviewed and discussed with patient certain preventive protocols, quality metrics, and best practice recommendations. A written personalized care plan for preventive services as well as general preventive health recommendations were provided to patient.     Esau Grew, RN  07/18/2018

## 2018-07-18 NOTE — Addendum Note (Signed)
Addended by: [name] on: 07/18/2018 04:04 PM   Modules accepted: Orders

## 2018-07-18 NOTE — Patient Instructions (Addendum)
[name] , Thank you for taking time to come for your Medicare Wellness Visit. I appreciate your ongoing commitment to your health goals. Please review the following plan we discussed and let me know if I can assist you in the future.   Flu vaccine was given today.  These are the goals we discussed: Goals      Patient Stated   . Maintain current level of physical activity (pt-stated)     Walking 20 minutes daily      Other   . Blood Pressure < 150/90       This is a list of the screening recommended for you and due dates:  Health Maintenance  Topic Date Due  . Tetanus Vaccine  07/12/2019*  . Flu Shot  Completed  . DEXA scan (bone density measurement)  Completed  . Pneumonia vaccines  Completed  *Topic was postponed. The date shown is not the original due date.     Fall Prevention in the Home, Adult Falls can cause injuries. They can happen to people of all ages. There are many things you can do to make your home safe and to help prevent falls. Ask for help when making these changes, if needed. What actions can I take to prevent falls? General Instructions  Use good lighting in all rooms. Replace any light bulbs that burn out.  Turn on the lights when you go into a dark area. Use night-lights.  Keep items that you use often in easy-to-reach places. Lower the shelves around your home if necessary.  Set up your furniture so you have a clear path. Avoid moving your furniture around.  Do not have throw rugs and other things on the floor that can make you trip.  Avoid walking on wet floors.  If any of your floors are uneven, fix them.  Add color or contrast paint or tape to clearly mark and help you see: ? Any grab bars or handrails. ? First and last steps of stairways. ? Where the edge of each step is.  If you use a stepladder: ? Make sure that it is fully opened. Do not climb a closed stepladder. ? Make sure that both sides of the stepladder are locked into  place. ? Ask someone to hold the stepladder for you while you use it.  If there are any pets around you, be aware of where they are. What can I do in the bathroom?      Keep the floor dry. Clean up any water that spills onto the floor as soon as it happens.  Remove soap buildup in the tub or shower regularly.  Use non-skid mats or decals on the floor of the tub or shower.  Attach bath mats securely with double-sided, non-slip rug tape.  If you need to sit down in the shower, use a plastic, non-slip stool.  Install grab bars by the toilet and in the tub and shower. Do not use towel bars as grab bars. What can I do in the bedroom?  Make sure that you have a light by your bed that is easy to reach.  Do not use any sheets or blankets that are too big for your bed. They should not hang down onto the floor.  Have a firm chair that has side arms. You can use this for support while you get dressed. What can I do in the kitchen?  Clean up any spills right away.  If you need to reach something above you,  use a strong step stool that has a grab bar.  Keep electrical cords out of the way.  Do not use floor polish or wax that makes floors slippery. If you must use wax, use non-skid floor wax. What can I do with my stairs?  Do not leave any items on the stairs.  Make sure that you have a light switch at the top of the stairs and the bottom of the stairs. If you do not have them, ask someone to add them for you.  Make sure that there are handrails on both sides of the stairs, and use them. Fix handrails that are broken or loose. Make sure that handrails are as long as the stairways.  Install non-slip stair treads on all stairs in your home.  Avoid having throw rugs at the top or bottom of the stairs. If you do have throw rugs, attach them to the floor with carpet tape.  Choose a carpet that does not hide the edge of the steps on the stairway.  Check any carpeting to make sure that  it is firmly attached to the stairs. Fix any carpet that is loose or worn. What can I do on the outside of my home?  Use bright outdoor lighting.  Regularly fix the edges of walkways and driveways and fix any cracks.  Remove anything that might make you trip as you walk through a door, such as a raised step or threshold.  Trim any bushes or trees on the path to your home.  Regularly check to see if handrails are loose or broken. Make sure that both sides of any steps have handrails.  Install guardrails along the edges of any raised decks and porches.  Clear walking paths of anything that might make someone trip, such as tools or rocks.  Have any leaves, snow, or ice cleared regularly.  Use sand or salt on walking paths during winter.  Clean up any spills in your garage right away. This includes grease or oil spills. What other actions can I take?  Wear shoes that: ? Have a low heel. Do not wear high heels. ? Have rubber bottoms. ? Are comfortable and fit you well. ? Are closed at the toe. Do not wear open-toe sandals.  Use tools that help you move around (mobility aids) if they are needed. These include: ? Canes. ? Walkers. ? Scooters. ? Crutches.  Review your medicines with your doctor. Some medicines can make you feel dizzy. This can increase your chance of falling. Ask your doctor what other things you can do to help prevent falls. Where to find more information  Centers for Disease Control and Prevention, STEADI: https://garcia.biz/  [employer] on Aging: BrainJudge.co.uk Contact a doctor if:  You are afraid of falling at home.  You feel weak, drowsy, or dizzy at home.  You fall at home. Summary  There are many simple things that you can do to make your home safe and to help prevent falls.  Ways to make your home safe include removing tripping hazards and installing grab bars in the bathroom.  Ask for help when making these changes in your  home. This information is not intended to replace advice given to you by your health care provider. Make sure you discuss any questions you have with your health care provider. Document Released: 05/09/2009 Document Revised: 02/25/2017 Document Reviewed: 02/25/2017 Elsevier Interactive Patient Education  2019 [employer].

## 2018-07-18 NOTE — Telephone Encounter (Signed)
New Rx for requip sent.

## 2018-07-19 ENCOUNTER — Ambulatory Visit
Admission: RE | Admit: 2018-07-19 | Discharge: 2018-07-19 | Disposition: A | Discharge status: Home / Self Care | Payer: PPO | Source: Ambulatory Visit | Attending: Family Medicine | Admitting: Family Medicine

## 2018-07-19 DIAGNOSIS — N179 Acute kidney failure, unspecified: Secondary | ICD-10-CM | POA: Diagnosis not present

## 2018-07-28 ENCOUNTER — Telehealth: Payer: Self-pay

## 2018-07-28 DIAGNOSIS — N3000 Acute cystitis without hematuria: Secondary | ICD-10-CM

## 2018-07-28 DIAGNOSIS — N390 Urinary tract infection, site not specified: Secondary | ICD-10-CM | POA: Diagnosis not present

## 2018-07-28 MED ORDER — CEPHALEXIN 500 MG PO CAPS: 500.0000 mg | ORAL_CAPSULE | Freq: Two times a day (BID) | ORAL | 0 refills | Status: DC

## 2018-07-28 NOTE — Addendum Note (Signed)
Addended by: Francene Castle on: 07/28/2018 12:20 PM   Modules accepted: Orders

## 2018-07-28 NOTE — Telephone Encounter (Signed)
Patient aware. Alisa Brake, RN ([facility] RN)  

## 2018-07-28 NOTE — Telephone Encounter (Signed)
RN team Yes she can come leave a sample and after that I will call in a rx too. I will put lab order in and send rx I want the urine before she takes antibiotic though THANKS! Dorcas Mcmurray

## 2018-07-28 NOTE — Telephone Encounter (Signed)
Patient left message she believes she has a UTI and wants to know if she can just come by and give a sample.  Call back is [phone].  Danley Danker, RN [facility] RN)

## 2018-07-29 ENCOUNTER — Telehealth: Payer: Self-pay

## 2018-07-29 NOTE — Telephone Encounter (Signed)
Patient aware and will start taking antibiotic. Danley Danker, RN [facility] RN)

## 2018-07-29 NOTE — Telephone Encounter (Signed)
Pt called nurse line requesting urine culture results from yesterday. I informed the patient urine cultures can take a couple of days to grow and hers is not back yet. Pt stated she has a medication at the pharmacy, however she is not to start taking until culture comes back. Pt stated, "I am so miserable every time I go to the bathroom." Pt would like to start medication right away if possible. Will forward to MD.

## 2018-07-29 NOTE — Telephone Encounter (Signed)
She is mistaken I WANT her to start the antibiotic NOW. Please let her know THANKS! Jacqueline [name]

## 2018-07-29 NOTE — Telephone Encounter (Signed)
Patient sister called (again) to see if any way the patient can be put on antibiotic now or something to help with the pain of UTI (?) through weekend because it is very painful and she is worried patient cannot wait for culture to grow.

## 2018-07-30 LAB — URINE CULTURE

## 2018-08-01 ENCOUNTER — Other Ambulatory Visit: Payer: Self-pay

## 2018-08-01 ENCOUNTER — Ambulatory Visit (INDEPENDENT_AMBULATORY_CARE_PROVIDER_SITE_OTHER): Payer: PPO | Admitting: Family Medicine

## 2018-08-01 ENCOUNTER — Encounter: Payer: Self-pay | Admitting: Family Medicine

## 2018-08-01 DIAGNOSIS — Z7901 Long term (current) use of anticoagulants: Secondary | ICD-10-CM

## 2018-08-01 DIAGNOSIS — I5032 Chronic diastolic (congestive) heart failure: Secondary | ICD-10-CM

## 2018-08-01 DIAGNOSIS — D500 Iron deficiency anemia secondary to blood loss (chronic): Secondary | ICD-10-CM | POA: Diagnosis not present

## 2018-08-01 DIAGNOSIS — N390 Urinary tract infection, site not specified: Secondary | ICD-10-CM | POA: Diagnosis not present

## 2018-08-01 DIAGNOSIS — N184 Chronic kidney disease, stage 4 (severe): Secondary | ICD-10-CM | POA: Diagnosis not present

## 2018-08-01 LAB — POCT URINALYSIS DIP (MANUAL ENTRY)
BILIRUBIN UA: NEGATIVE
Blood, UA: NEGATIVE
Glucose, UA: NEGATIVE mg/dL
Ketones, POC UA: NEGATIVE mg/dL
Nitrite, UA: NEGATIVE
Protein Ur, POC: NEGATIVE mg/dL
Spec Grav, UA: 1.015 (ref 1.010–1.025)
Urobilinogen, UA: 0.2 E.U./dL
pH, UA: 6.5 (ref 5.0–8.0)

## 2018-08-01 LAB — POCT UA - MICROSCOPIC ONLY

## 2018-08-01 MED ORDER — CEPHALEXIN 500 MG PO CAPS: 500.0000 mg | ORAL_CAPSULE | Freq: Three times a day (TID) | ORAL | 0 refills | Status: DC

## 2018-08-01 NOTE — Patient Instructions (Signed)
I will call with blood test results. I hope that treating the urine infection helps you feel better.  We may be dealing with asymptomatic bacteruria.  If that is the case, the antibiotics won't make you feel better. See me in one month for a recheck.  Sooner if you are concerned or if symptoms pop up.   Try some probiotics for your diarrhea.

## 2018-08-02 ENCOUNTER — Encounter: Payer: Self-pay | Admitting: Family Medicine

## 2018-08-02 LAB — CBC
Hematocrit: 31.9 % — ABNORMAL LOW (ref 34.0–46.6)
Hemoglobin: 10.5 g/dL — ABNORMAL LOW (ref 11.1–15.9)
MCH: 31.9 pg (ref 26.6–33.0)
MCHC: 32.9 g/dL (ref 31.5–35.7)
MCV: 97 fL (ref 79–97)
Platelets: 204 10*3/uL (ref 150–450)
RBC: 3.29 x10E6/uL — ABNORMAL LOW (ref 3.77–5.28)
RDW: 12.4 % (ref 11.7–15.4)
WBC: 3.7 10*3/uL (ref 3.4–10.8)

## 2018-08-02 LAB — BASIC METABOLIC PANEL
BUN/Creatinine Ratio: 11 — ABNORMAL LOW (ref 12–28)
BUN: 21 mg/dL (ref 8–27)
CO2: 22 mmol/L (ref 20–29)
Calcium: 10.4 mg/dL — ABNORMAL HIGH (ref 8.7–10.3)
Chloride: 101 mmol/L (ref 96–106)
Creatinine, Ser: 1.96 mg/dL — ABNORMAL HIGH (ref 0.57–1.00)
GFR calc Af Amer: 26 mL/min/{1.73_m2} — ABNORMAL LOW (ref >59)
GFR calc non Af Amer: 23 mL/min/{1.73_m2} — ABNORMAL LOW (ref >59)
Glucose: 98 mg/dL (ref 65–99)
Potassium: 4.5 mmol/L (ref 3.5–5.2)
SODIUM: 139 mmol/L (ref 134–144)

## 2018-08-02 NOTE — Assessment & Plan Note (Signed)
Check for worsening anemia esp given anticoag.

## 2018-08-02 NOTE — Assessment & Plan Note (Addendum)
Unclear in my mind if this is asymptomatic bacteruria or symptomatic UTI.  Err on the side of treatment. Will treat as upper tract disease with TID Keflex.

## 2018-08-02 NOTE — Assessment & Plan Note (Signed)
BMP shows improvement.  AKI has resolved.

## 2018-08-02 NOTE — Progress Notes (Signed)
Established Patient Office Visit  Subjective:  Patient ID: Jacqueline [name], female    DOB: 1930/10/11  Age: 85 y.o. MRN: [id number]  CC:  Chief Complaint  Patient presents with  . Follow-up UTI    HPI Jacqueline [name] presents for generalized weakness, possible UTI.  Patient has an odd, difficult to piece together story.  When seen by cards on 07/07/18, she felt great.  Two days later had chest pain lasting ~60 minutes and felt weak afterwards.  Bloodwork done during cards visit showed  AKI.  I saw her on 12/18 and 12/23 (in conjunction with Annual wellness visit.) She still felt generalized weakness.  No fever or focal symptoms.  No chest pain then or now.  EKG was reassuring.  I stopped her lasix.  Specifically denied urgency, frequency or dysuria.  UA did show bacturia.  Treated as a symptomatic UTI.  Because creat was still 2.4, did renal ultrasound which was only suggestive of medical renal disease.  Worsened again on 1/2 and retreated per phone with Keflex.  Maybe some improvement with initial treatment.  Here today for FU.  Denies urgency, frequency or dysuria or fever.  Denies CP and SOB.  No focal symptoms.  She is a little better than she was several weeks ago.  Still weak.    Past Medical History:  Diagnosis Date  . Arthritis    back  . Atrial fibrillation with rapid ventricular response ([place]) 02/2014   a. CHA2DS2VASc = 6 -> on eliquis;  b. 02/2014 s/p DCCV;  c. 08/2014 Echo: EF 55-60%, Gr 2 DD, mild MR, triv AI.  Marland Kitchen CAD (coronary artery disease)    a. s/p CABG x 2 (LIMA->LAD, VG->Diag);  b. 08/2014 Cath: LM nl, LAD 90p, LCX nl, RCA nl/dominant, LIMA->LAD atretic, VG->D2 patent w/ retrograde filling of LAD, EF 55-60%-->Med Rx.  . Diverticulitis 02/21/2012  . GERD (gastroesophageal reflux disease)   . Hyperlipemia   . Hypertension   . Insomnia     Past Surgical History:  Procedure Laterality Date  . ABDOMINAL HYSTERECTOMY  1975  . CARDIAC CATHETERIZATION N/A 05/09/2015   Procedure: Right Heart Cath;  Surgeon: Larey Dresser, MD;  Location: [facility];  Service: Cardiovascular;  Laterality: N/A;  . CARDIOVERSION N/A 02/28/2014   Procedure: CARDIOVERSION;  Surgeon: Sanda Klein, MD;  Location: [facility];  Service: Cardiovascular;  Laterality: N/A;  . CATARACT EXTRACTION Bilateral 2014   with lens implanted  . COLONOSCOPY N/A 02/19/2015   Procedure: COLONOSCOPY;  Surgeon: Ladene Artist, MD;  Location: [facility] ENDOSCOPY;  Service: Endoscopy;  Laterality: N/A;  . CORONARY ARTERY BYPASS GRAFT  2006   off-pump bypass surgery with LIMA to the LAD and SVG to the second diagonal artery ( performed by Dr Servando Snare)   . JOINT REPLACEMENT Bilateral    L-2004, R-2006  . LEFT HEART CATHETERIZATION WITH CORONARY/GRAFT ANGIOGRAM N/A 09/17/2014   Procedure: LEFT HEART CATHETERIZATION WITH Beatrix Fetters;  Surgeon: Troy Sine, MD;  Location: [facility]ATH LAB;  Service: Cardiovascular;  Laterality: N/A;  . REIMPLANTATION OF TOTAL KNEE Right 10/08/2014   Procedure: REIMPLANTATION OF RIGHT TOTAL KNEE ARTHROPLASTY WITH REMOVAL OF ANTIBIOTIC SPACER;  Surgeon: Paralee Cancel, MD;  Location: [facility];  Service: Orthopedics;  Laterality: Right;  . ROTATOR CUFF REPAIR Bilateral    r-1999, l- 2005  . TEE WITHOUT CARDIOVERSION N/A 02/28/2014   Procedure: TRANSESOPHAGEAL ECHOCARDIOGRAM (TEE);  Surgeon: Sanda Klein, MD;  Location: [place];  Service: Cardiovascular;  Laterality:  N/A;  . TOTAL KNEE ARTHROPLASTY Right 07/02/2014   Procedure: Resection of Infected Right Total Knee Arthroplasty with placement antibiotic spacer;  Surgeon: Mauri Pole, MD;  Location: [facility];  Service: Orthopedics;  Laterality: Right;    Family History  Problem Relation Age of Onset  . Parkinson's disease Mother   . Heart attack Brother   . Arthritis Brother   . Cancer Brother        Unknown  . Breast cancer Neg Hx     Social History   Socioeconomic History  . Marital status: Widowed      Spouse name: Not on file  . Number of children: 2  . Years of education: 10  . Highest education level: 10th grade  Occupational History  . Not on file  Social Needs  . Financial resource strain: Not very hard  . Food insecurity:    Worry: Never true    Inability: Never true  . Transportation needs:    Medical: No    Non-medical: No  Tobacco Use  . Smoking status: Never Smoker  . Smokeless tobacco: Never Used  Substance and Sexual Activity  . Alcohol use: No  . Drug use: No  . Sexual activity: Not Currently    Birth control/protection: Post-menopausal  Lifestyle  . Physical activity:    Days per week: 7 days    Minutes per session: 10 min  . Stress: Not at all  Relationships  . Social connections:    Talks on phone: More than three times a week    Gets together: More than three times a week    Attends religious service: More than 4 times per year    Active member of club or organization: No    Attends meetings of clubs or organizations: Never    Relationship status: Widowed  . Intimate partner violence:    Fear of current or ex partner: No    Emotionally abused: No    Physically abused: No    Forced sexual activity: No  Other Topics Concern  . 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 Drinks water and one cup of coffee daily.   Religious / Personal Beliefs: Baptist. "I believe Jesus died on the cross to save me [id number] my sins. I am saved by the grace of God."    Other: "I have a good life and am happy. My  family is there when I need help."                                                               Outpatient Medications Prior to Visit  Medication Sig Dispense Refill  . acetaminophen (TYLENOL) 500 MG tablet Take 500-1,000 mg by mouth every 6 (six) hours as needed for moderate  pain or headache.    . ALPRAZolam (XANAX) 0.5 MG tablet TAKE 1 TABLET BY MOUTH TWICE A DAY AS NEEDED FOR ANXIETY 60 tablet 5  . amiodarone (PACERONE) 100 MG tablet Take 1 tablet (100 mg total) by mouth daily. 90 tablet 3  . apixaban (ELIQUIS) 2.5 MG TABS tablet Take 1 tablet (2.5 mg total) by mouth 2 (two) times daily. 180 tablet 3  . gabapentin (NEURONTIN) 100 MG capsule TAKE 1 CAPSULE (100 MG TOTAL) BY MOUTH 3 (THREE) TIMES DAILY. 270 capsule 3  . Iron-Vitamins (GERITOL COMPLETE PO) Take 1 tablet by mouth daily.    . isosorbide mononitrate (IMDUR) 30 MG 24 hr tablet Take 1 tablet (30 mg total) by mouth daily. 90 tablet 3  . levothyroxine (SYNTHROID, LEVOTHROID) 50 MCG tablet Take 1 tablet (50 mcg total) by mouth daily. 90 tablet 3  . metoprolol succinate (TOPROL-XL) 25 MG 24 hr tablet Take 0.5 tablets (12.5 mg total) by mouth daily for 5 days. 5 tablet 0  . nitroGLYCERIN (NITROSTAT) 0.4 MG SL tablet PLACE 1 TABLET (0.4 MG TOTAL) UNDER THE TONGUE EVERY 5 (FIVE) MINUTES AS NEEDED FOR CHEST PAIN. X 3 doses 25 tablet 1  . Omega-3 Fatty Acids (FISH OIL) 1000 MG CAPS Take 1-2 capsules by mouth See admin instructions. Takes one in the morning and two capsules at night.    Marland Kitchen omeprazole (PRILOSEC) 20 MG capsule TAKE ONE CAPSULE BY MOUTH EVERY DAY 90 capsule 3  . polyethylene glycol powder (GLYCOLAX/MIRALAX) powder MIX 17 G AND TAKE BY MOUTH DAILY. (Patient taking differently: MIX 17 G AND TAKE BY MOUTH DAILY AS NEEDED FOR CONSTIPATION) 527 g 12  . rOPINIRole (REQUIP) 0.25 MG tablet TAKE 1 TABLET BY MOUTH 3 TIMES A DAY 270 tablet 3  . simvastatin (ZOCOR) 20 MG tablet Take 1 tablet (20 mg total) by mouth at bedtime. 90 tablet 3  .  traMADol (ULTRAM) 50 MG tablet TAKE 1 TABLET BY MOUTH TWICE A DAY AS NEEDED FOR PAIN 60 tablet 5  . traZODone (DESYREL) 100 MG tablet TAKE 1 TABLET (100 MG TOTAL) BY MOUTH AT BEDTIME. 90 tablet 3  . cephALEXin (KEFLEX) 500 MG capsule Take 1 capsule (500 mg total) by mouth 2 (two) times daily. 14 capsule 0  . furosemide (LASIX) 20 MG tablet TAKE 1 TABLET BY MOUTH EVERY DAY 90 tablet 3   No facility-administered medications prior to visit.     Allergies  Allergen Reactions  . Atorvastatin Other (See Comments)    Myalgias in legs  . Crestor [Rosuvastatin] Other (See Comments)    Myalgias in legs  . Morphine And Related Other (See Comments)    "Crazy thoughts, severe headache, sick"  . Prednisone     On two occasions has caused A fib    ROS Review of Systems    Objective:    Physical Exam  BP 128/66   Pulse 70   Temp 97.6 F (36.4 C) (Oral)   Ht 5\' 1"  (1.549 m)   Wt 135 lb 6.4 oz (61.4 kg)   SpO2 97%   BMI 25.58 kg/m  Wt Readings from Last 3 Encounters:  08/01/18 135 lb 6.4 oz (61.4 kg)  07/18/18 138 lb (62.6 kg)  07/13/18 138 lb (62.6 kg)   VS noted and no sig weight gain. Lungs clear Cardiac rRR without m or g Abd benign.  UA has pyuria and bacturia.   There are no preventive care reminders to display for this patient.  There are  no preventive care reminders to display for this patient.  Lab Results  Component Value Date   TSH 5.770 (H) 07/08/2018   Lab Results  Component Value Date   WBC 3.7 08/01/2018   HGB 10.5 (L) 08/01/2018   HCT 31.9 (L) 08/01/2018   MCV 97 08/01/2018   PLT 204 08/01/2018   Lab Results  Component Value Date   NA 139 08/01/2018   K 4.5 08/01/2018   CO2 22 08/01/2018   GLUCOSE 98 08/01/2018   BUN 21 08/01/2018   CREATININE 1.96 (H) 08/01/2018   BILITOT <0.2 07/13/2018   ALKPHOS 69 07/13/2018   AST 32 07/13/2018   ALT 25 07/13/2018   PROT 6.3 07/13/2018   ALBUMIN 4.5 07/13/2018   CALCIUM 10.4 (H) 08/01/2018   ANIONGAP  10 09/13/2017   Lab Results  Component Value Date   CHOL 168 07/08/2018   Lab Results  Component Value Date   HDL 58 07/08/2018   Lab Results  Component Value Date   LDLCALC 84 07/08/2018   Lab Results  Component Value Date   TRIG 130 07/08/2018   Lab Results  Component Value Date   CHOLHDL 2.9 07/08/2018   Lab Results  Component Value Date   HGBA1C 5.8 (H) 07/23/2017      Assessment & Plan:   Problem List Items Addressed This Visit    Urinary tract infection   Relevant Medications   cephALEXin (KEFLEX) 500 MG capsule   Other Relevant Orders   POCT UA - Microscopic Only (Completed)   POCT urinalysis dipstick (Completed)   Long term (current) use of anticoagulants   Relevant Orders   CBC (Completed)   CKD (chronic kidney disease) stage 4, GFR 15-29 ml/min (HCC)   Relevant Orders   Basic Metabolic Panel (Completed)      Meds ordered this encounter  Medications  . cephALEXin (KEFLEX) 500 MG capsule    Sig: Take 1 capsule (500 mg total) by mouth 3 (three) times daily.    Dispense:  15 capsule    Refill:  0    Follow-up: No follow-ups on file.    [name], MD

## 2018-08-02 NOTE — Assessment & Plan Note (Signed)
Doing well off lasix.  No evidence of CHF.

## 2018-08-16 MED ORDER — APIXABAN 2.5 MG PO TABS: 2.5000 mg | ORAL_TABLET | Freq: Two times a day (BID) | ORAL | 3 refills | Status: DC

## 2018-08-16 NOTE — Telephone Encounter (Signed)
This encounter was created in error - please disregard.

## 2018-08-18 ENCOUNTER — Telehealth: Payer: Self-pay

## 2018-08-18 NOTE — Telephone Encounter (Signed)
Called patient left message on personal voice mail we received a letter from [facility] you were not eligible for patient assistance for Eliquis.Advised to call back if you have any questions.

## 2018-08-25 ENCOUNTER — Telehealth: Payer: Self-pay

## 2018-08-25 NOTE — Telephone Encounter (Signed)
Spoke to patient we received letter from [facility] patient assistance, Eliquis was denied.Stated she received a letter also.Stated she has a prescription, does not need samples at this time.

## 2018-09-14 ENCOUNTER — Ambulatory Visit (INDEPENDENT_AMBULATORY_CARE_PROVIDER_SITE_OTHER): Payer: PPO | Admitting: Family Medicine

## 2018-09-14 ENCOUNTER — Encounter: Payer: Self-pay | Admitting: Family Medicine

## 2018-09-14 ENCOUNTER — Other Ambulatory Visit: Payer: Self-pay

## 2018-09-14 VITALS — BP 136/74 | HR 70 | Temp 97.9°F | Ht 61.0 in | Wt 133.4 lb

## 2018-09-14 DIAGNOSIS — N390 Urinary tract infection, site not specified: Secondary | ICD-10-CM

## 2018-09-14 DIAGNOSIS — E039 Hypothyroidism, unspecified: Secondary | ICD-10-CM

## 2018-09-14 DIAGNOSIS — R531 Weakness: Secondary | ICD-10-CM

## 2018-09-14 DIAGNOSIS — R300 Dysuria: Secondary | ICD-10-CM

## 2018-09-14 LAB — POCT URINALYSIS DIP (MANUAL ENTRY)
Bilirubin, UA: NEGATIVE
Glucose, UA: NEGATIVE mg/dL
Ketones, POC UA: NEGATIVE mg/dL
Nitrite, UA: POSITIVE — AB
Protein Ur, POC: NEGATIVE mg/dL
SPEC GRAV UA: 1.01 (ref 1.010–1.025)
Urobilinogen, UA: 0.2 E.U./dL
pH, UA: 6.5 (ref 5.0–8.0)

## 2018-09-14 LAB — POCT UA - MICROSCOPIC ONLY

## 2018-09-14 LAB — POCT SEDIMENTATION RATE: POCT SED RATE: 46 mm/hr — AB (ref 0–22)

## 2018-09-14 MED ORDER — CIPROFLOXACIN HCL 500 MG PO TABS: 500.0000 mg | ORAL_TABLET | Freq: Two times a day (BID) | ORAL | 0 refills | Status: DC

## 2018-09-14 NOTE — Progress Notes (Signed)
Established Patient Office Visit  Subjective:  Patient ID: Jacqueline [name], female    DOB: December 28, 1930  Age: 85 y.o. MRN: [id number]  CC:  Chief Complaint  Patient presents with  . Fatigue    HPI Jacqueline [name] presents for continued weakness and fatigue.  See last two notes.  Generalized weakness without focal symptoms.  "I just don't feel well."  No sx of depression.  Does have longstanding back pain.  Has poor appetite.  No abd pain or vomiting.  Got some better with Rx of presumed UTI.  Wonders if it is back.  States drinking plenty of fluids.  Past Medical History:  Diagnosis Date  . Arthritis    back  . Atrial fibrillation with rapid ventricular response ([place]) 02/2014   a. CHA2DS2VASc = 6 -> on eliquis;  b. 02/2014 s/p DCCV;  c. 08/2014 Echo: EF 55-60%, Gr 2 DD, mild MR, triv AI.  Marland Kitchen CAD (coronary artery disease)    a. s/p CABG x 2 (LIMA->LAD, VG->Diag);  b. 08/2014 Cath: LM nl, LAD 90p, LCX nl, RCA nl/dominant, LIMA->LAD atretic, VG->D2 patent w/ retrograde filling of LAD, EF 55-60%-->Med Rx.  . Diverticulitis 02/21/2012  . GERD (gastroesophageal reflux disease)   . Hyperlipemia   . Hypertension   . Insomnia     Past Surgical History:  Procedure Laterality Date  . ABDOMINAL HYSTERECTOMY  1975  . CARDIAC CATHETERIZATION N/A 05/09/2015   Procedure: Right Heart Cath;  Surgeon: Larey Dresser, MD;  Location: [place] [facility];  Service: Cardiovascular;  Laterality: N/A;  . CARDIOVERSION N/A 02/28/2014   Procedure: CARDIOVERSION;  Surgeon: Sanda Klein, MD;  Location: [facility];  Service: Cardiovascular;  Laterality: N/A;  . CATARACT EXTRACTION Bilateral 2014   with lens implanted  . COLONOSCOPY N/A 02/19/2015   Procedure: COLONOSCOPY;  Surgeon: Ladene Artist, MD;  Location: [facility] ENDOSCOPY;  Service: Endoscopy;  Laterality: N/A;  . CORONARY ARTERY BYPASS GRAFT  2006   off-pump bypass surgery with LIMA to the LAD and SVG to the second diagonal artery ( performed by Dr Servando Snare)    . JOINT REPLACEMENT Bilateral    L-2004, R-2006  . LEFT HEART CATHETERIZATION WITH CORONARY/GRAFT ANGIOGRAM N/A 09/17/2014   Procedure: LEFT HEART CATHETERIZATION WITH Beatrix Fetters;  Surgeon: Troy Sine, MD;  Location: [facility] CATH LAB;  Service: Cardiovascular;  Laterality: N/A;  . REIMPLANTATION OF TOTAL KNEE Right 10/08/2014   Procedure: REIMPLANTATION OF RIGHT TOTAL KNEE ARTHROPLASTY WITH REMOVAL OF ANTIBIOTIC SPACER;  Surgeon: Paralee Cancel, MD;  Location: [facility];  Service: Orthopedics;  Laterality: Right;  . ROTATOR CUFF REPAIR Bilateral    r-1999, l- 2005  . TEE WITHOUT CARDIOVERSION N/A 02/28/2014   Procedure: TRANSESOPHAGEAL ECHOCARDIOGRAM (TEE);  Surgeon: Sanda Klein, MD;  Location: [place];  Service: Cardiovascular;  Laterality: N/A;  . TOTAL KNEE ARTHROPLASTY Right 07/02/2014   Procedure: Resection of Infected Right Total Knee Arthroplasty with placement antibiotic spacer;  Surgeon: Mauri Pole, MD;  Location: [facility];  Service: Orthopedics;  Laterality: Right;    Family History  Problem Relation Age of Onset  . Parkinson's disease Mother   . Heart attack Brother   . Arthritis Brother   . Cancer Brother        Unknown  . Breast cancer Neg Hx     Social History   Socioeconomic History  . Marital status: Widowed    Spouse name: Not on file  . Number of children: 2  . Years of  education: 10  . Highest education level: 10th grade  Occupational History  . Not on file  Social Needs  . Financial resource strain: Not very hard  . Food insecurity:    Worry: Never true    Inability: Never true  . Transportation needs:    Medical: No    Non-medical: No  Tobacco Use  . Smoking status: Never Smoker  . Smokeless tobacco: Never Used  Substance and Sexual Activity  . Alcohol use: No  . Drug use: No  . Sexual activity: Not Currently    Birth control/protection: Post-menopausal  Lifestyle  . Physical activity:    Days per week: 7 days    Minutes per  session: 10 min  . Stress: Not at all  Relationships  . Social connections:    Talks on phone: More than three times a week    Gets together: More than three times a week    Attends religious service: More than 4 times per year    Active member of club or organization: No    Attends meetings of clubs or organizations: Never    Relationship status: Widowed  . Intimate partner violence:    Fear of current or ex partner: No    Emotionally abused: No    Physically abused: No    Forced sexual activity: No  Other Topics Concern  . 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 Drinks water and one cup of coffee daily.   Religious / Personal Beliefs: Baptist. "I believe Jesus died on the cross to save me [id number] my sins. I am saved by the grace of God."    Other: "I have a good life and am happy. My family is there when I need help."                                                               Outpatient Medications Prior to Visit  Medication Sig Dispense Refill  . acetaminophen (TYLENOL) 500 MG tablet Take 500-1,000 mg by mouth every 6 (six) hours as needed for moderate pain or headache.    . ALPRAZolam (XANAX) 0.5 MG tablet TAKE 1 TABLET BY MOUTH TWICE A DAY AS NEEDED FOR ANXIETY 60 tablet 5  . amiodarone (PACERONE) 100 MG tablet Take 1 tablet (100 mg total) by mouth daily. 90 tablet 3  . apixaban (ELIQUIS) 2.5 MG TABS tablet Take 1 tablet (2.5 mg total) by mouth 2 (two) times daily. 180 tablet 3  . gabapentin (NEURONTIN) 100 MG  capsule TAKE 1 CAPSULE (100 MG TOTAL) BY MOUTH 3 (THREE) TIMES DAILY. 270 capsule 3  . Iron-Vitamins (GERITOL COMPLETE PO) Take 1 tablet by mouth daily.    . isosorbide mononitrate (IMDUR) 30 MG 24 hr tablet Take 1 tablet (  30 mg total) by mouth daily. 90 tablet 3  . levothyroxine (SYNTHROID, LEVOTHROID) 50 MCG tablet Take 1 tablet (50 mcg total) by mouth daily. 90 tablet 3  . metoprolol succinate (TOPROL-XL) 25 MG 24 hr tablet Take 0.5 tablets (12.5 mg total) by mouth daily for 5 days. 5 tablet 0  . nitroGLYCERIN (NITROSTAT) 0.4 MG SL tablet PLACE 1 TABLET (0.4 MG TOTAL) UNDER THE TONGUE EVERY 5 (FIVE) MINUTES AS NEEDED FOR CHEST PAIN. X 3 doses 25 tablet 1  . Omega-3 Fatty Acids (FISH OIL) 1000 MG CAPS Take 1-2 capsules by mouth See admin instructions. Takes one in the morning and two capsules at night.    Marland Kitchen omeprazole (PRILOSEC) 20 MG capsule TAKE ONE CAPSULE BY MOUTH EVERY DAY 90 capsule 3  . polyethylene glycol powder (GLYCOLAX/MIRALAX) powder MIX 17 G AND TAKE BY MOUTH DAILY. (Patient taking differently: MIX 17 G AND TAKE BY MOUTH DAILY AS NEEDED FOR CONSTIPATION) 527 g 12  . rOPINIRole (REQUIP) 0.25 MG tablet TAKE 1 TABLET BY MOUTH 3 TIMES A DAY 270 tablet 3  . simvastatin (ZOCOR) 20 MG tablet Take 1 tablet (20 mg total) by mouth at bedtime. 90 tablet 3  . traMADol (ULTRAM) 50 MG tablet TAKE 1 TABLET BY MOUTH TWICE A DAY AS NEEDED FOR PAIN 60 tablet 5  . traZODone (DESYREL) 100 MG tablet TAKE 1 TABLET (100 MG TOTAL) BY MOUTH AT BEDTIME. 90 tablet 3  . cephALEXin (KEFLEX) 500 MG capsule Take 1 capsule (500 mg total) by mouth 3 (three) times daily. 15 capsule 0   No facility-administered medications prior to visit.     Allergies  Allergen Reactions  . Atorvastatin Other (See Comments)    Myalgias in legs  . Crestor [Rosuvastatin] Other (See Comments)    Myalgias in legs  . Morphine And Related Other (See Comments)    "Crazy thoughts, severe headache, sick"  . Prednisone     On two  occasions has caused A fib    ROS Review of Systems    Objective:    Physical Exam  BP 136/74   Pulse 70   Temp 97.9 F (36.6 C) (Oral)   Ht '5\' 1"'  (1.549 m)   Wt 133 lb 6.4 oz (60.5 kg)   SpO2 99%   BMI 25.21 kg/m  Wt Readings from Last 3 Encounters:  09/14/18 133 lb 6.4 oz (60.5 kg)  08/01/18 135 lb 6.4 oz (61.4 kg)  07/18/18 138 lb (62.6 kg)  Lungs clear Cardiac RRR without m or g abd benign Back. No boney tenderness   There are no preventive care reminders to display for this patient.  There are no preventive care reminders to display for this patient.  Lab Results  Component Value Date   TSH 5.770 (H) 07/08/2018   Lab Results  Component Value Date   WBC 3.7 08/01/2018   HGB 10.5 (L) 08/01/2018   HCT 31.9 (L) 08/01/2018   MCV 97 08/01/2018   PLT 204 08/01/2018   Lab Results  Component Value Date   NA 139 08/01/2018   K 4.5 08/01/2018   CO2 22 08/01/2018   GLUCOSE 98 08/01/2018   BUN 21 08/01/2018   CREATININE 1.96 (H) 08/01/2018   BILITOT <0.2 07/13/2018   ALKPHOS 69 07/13/2018   AST 32 07/13/2018   ALT 25 07/13/2018   PROT 6.3 07/13/2018   ALBUMIN 4.5 07/13/2018   CALCIUM 10.4 (H) 08/01/2018   ANIONGAP 10 09/13/2017   Lab Results  Component  Value Date   CHOL 168 07/08/2018   Lab Results  Component Value Date   HDL 58 07/08/2018   Lab Results  Component Value Date   LDLCALC 84 07/08/2018   Lab Results  Component Value Date   TRIG 130 07/08/2018   Lab Results  Component Value Date   CHOLHDL 2.9 07/08/2018   Lab Results  Component Value Date   HGBA1C 5.8 (H) 07/23/2017      Assessment & Plan:   Problem List Items Addressed This Visit    Weakness   Relevant Orders   POCT SEDIMENTATION RATE   CMP14+EGFR   Urinary tract infection   Relevant Medications   ciprofloxacin (CIPRO) 500 MG tablet   Other Relevant Orders   Urine Culture   Hypothyroidism   Relevant Orders   TSH    Other Visit Diagnoses    Dysuria    -   Primary   Relevant Orders   POCT urinalysis dipstick (Completed)   POCT UA - Microscopic Only (Completed)      Meds ordered this encounter  Medications  . ciprofloxacin (CIPRO) 500 MG tablet    Sig: Take 1 tablet (500 mg total) by mouth 2 (two) times daily.    Dispense:  20 tablet    Refill:  0    Follow-up: No follow-ups on file.    [name], MD

## 2018-09-14 NOTE — Assessment & Plan Note (Addendum)
Check TSH.  Normal in Dec [place] TSH Maybe subclinical hypothyroidism.  Maybe symptomatic.  Regardless, will increase her synthyroid.

## 2018-09-14 NOTE — Patient Instructions (Signed)
I will call tomorrow with the blood test results. It will likely be Friday before I have the urine culture. I sent in an antibiotic again. We will keep working on this.

## 2018-09-14 NOTE — Assessment & Plan Note (Signed)
Again suggests UTI.  Will culture and treat with cipro.  Could be asymptomatic bacturia and a distractor.

## 2018-09-14 NOTE — Assessment & Plan Note (Signed)
Good control.  No evidence of over treatment.

## 2018-09-14 NOTE — Assessment & Plan Note (Signed)
Long differential.  Hypothyroid, liver disease, CKD, temporal arteritis or PMR.  Reassuring that weight and previous labs are good.

## 2018-09-15 LAB — CMP14+EGFR
ALT: 15 IU/L (ref 0–32)
AST: 24 IU/L (ref 0–40)
Albumin/Globulin Ratio: 1.8 (ref 1.2–2.2)
Albumin: 4.3 g/dL (ref 3.6–4.6)
Alkaline Phosphatase: 83 IU/L (ref 39–117)
BUN/Creatinine Ratio: 11 — ABNORMAL LOW (ref 12–28)
BUN: 20 mg/dL (ref 8–27)
CO2: 22 mmol/L (ref 20–29)
Calcium: 9.7 mg/dL (ref 8.7–10.3)
Chloride: 102 mmol/L (ref 96–106)
Creatinine, Ser: 1.9 mg/dL — ABNORMAL HIGH (ref 0.57–1.00)
GFR calc Af Amer: 27 mL/min/{1.73_m2} — ABNORMAL LOW (ref >59)
GFR calc non Af Amer: 24 mL/min/{1.73_m2} — ABNORMAL LOW (ref >59)
Globulin, Total: 2.4 g/dL (ref 1.5–4.5)
Glucose: 86 mg/dL (ref 65–99)
Potassium: 4.9 mmol/L (ref 3.5–5.2)
Sodium: 142 mmol/L (ref 134–144)
Total Protein: 6.7 g/dL (ref 6.0–8.5)

## 2018-09-15 LAB — TSH: TSH: 6.58 u[IU]/mL — ABNORMAL HIGH (ref 0.450–4.500)

## 2018-09-15 MED ORDER — LEVOTHYROXINE SODIUM 75 MCG PO TABS: 75.0000 ug | ORAL_TABLET | Freq: Every day | ORAL | 3 refills | Status: DC

## 2018-09-16 LAB — URINE CULTURE

## 2018-09-28 ENCOUNTER — Other Ambulatory Visit: Payer: Self-pay

## 2018-09-28 ENCOUNTER — Ambulatory Visit (INDEPENDENT_AMBULATORY_CARE_PROVIDER_SITE_OTHER): Payer: PPO | Admitting: Family Medicine

## 2018-09-28 ENCOUNTER — Encounter: Payer: Self-pay | Admitting: Family Medicine

## 2018-09-28 VITALS — BP 136/68 | HR 65 | Temp 98.1°F | Ht 61.0 in | Wt 134.0 lb

## 2018-09-28 DIAGNOSIS — N390 Urinary tract infection, site not specified: Secondary | ICD-10-CM

## 2018-09-28 LAB — POCT URINALYSIS DIP (MANUAL ENTRY)
Bilirubin, UA: NEGATIVE
Blood, UA: NEGATIVE
Glucose, UA: NEGATIVE mg/dL
Ketones, POC UA: NEGATIVE mg/dL
Nitrite, UA: NEGATIVE
PROTEIN UA: NEGATIVE mg/dL
Spec Grav, UA: 1.02 (ref 1.010–1.025)
Urobilinogen, UA: 0.2 E.U./dL
pH, UA: 6 (ref 5.0–8.0)

## 2018-09-28 LAB — POCT UA - MICROSCOPIC ONLY

## 2018-09-28 NOTE — Assessment & Plan Note (Signed)
Treated.  Culture and sensitivities confirm cipro should work.  Urine now clean.  In retrospect, most likely that we have been dealing with symptomatic UTIs.

## 2018-09-28 NOTE — Patient Instructions (Signed)
Your urine is clear today.  I am glad you are 90% better.  Work hard to get that other 10% back. See me in May to recheck your thyroid.   Good luck with your roof replacement.

## 2018-09-28 NOTE — Progress Notes (Signed)
Established Patient Office Visit  Subjective:  Patient ID: Jacqueline [name], female    DOB: 1930/09/12  Age: 85 y.o. MRN: [id number]  CC:  Chief Complaint  Patient presents with  . Follow Up UTI    HPI Jacqueline [name] presents for UTI.  Has had two presumed UTIs and has not felt well since Christmas.  Did do well with last treatment and is 90% back to normal.  Also in the differential is that she had asymptomatic bacturia and another cause of feeling bad.  Because of this possibility, she is here for test of cure.  Denies fever, urgency, frequency or dysuria.  Past Medical History:  Diagnosis Date  . Arthritis    back  . Atrial fibrillation with rapid ventricular response ([place]) 02/2014   a. CHA2DS2VASc = 6 -> on eliquis;  b. 02/2014 s/p DCCV;  c. 08/2014 Echo: EF 55-60%, Gr 2 DD, mild MR, triv AI.  Marland Kitchen CAD (coronary artery disease)    a. s/p CABG x 2 (LIMA->LAD, VG->Diag);  b. 08/2014 Cath: LM nl, LAD 90p, LCX nl, RCA nl/dominant, LIMA->LAD atretic, VG->D2 patent w/ retrograde filling of LAD, EF 55-60%-->Med Rx.  . Diverticulitis 02/21/2012  . GERD (gastroesophageal reflux disease)   . Hyperlipemia   . Hypertension   . Insomnia     Past Surgical History:  Procedure Laterality Date  . ABDOMINAL HYSTERECTOMY  1975  . CARDIAC CATHETERIZATION N/A 05/09/2015   Procedure: Right Heart Cath;  Surgeon: Larey Dresser, MD;  Location: [place] [facility];  Service: Cardiovascular;  Laterality: N/A;  . CARDIOVERSION N/A 02/28/2014   Procedure: CARDIOVERSION;  Surgeon: Sanda Klein, MD;  Location: [place] [facility];  Service: Cardiovascular;  Laterality: N/A;  . CATARACT EXTRACTION Bilateral 2014   with lens implanted  . COLONOSCOPY N/A 02/19/2015   Procedure: COLONOSCOPY;  Surgeon: Ladene Artist, MD;  Location: [facility];  Service: Endoscopy;  Laterality: N/A;  . CORONARY ARTERY BYPASS GRAFT  2006   off-pump bypass surgery with LIMA to the LAD and SVG to the second diagonal artery ( performed by  Dr Servando Snare)   . JOINT REPLACEMENT Bilateral    L-2004, R-2006  . LEFT HEART CATHETERIZATION WITH CORONARY/GRAFT ANGIOGRAM N/A 09/17/2014   Procedure: LEFT HEART CATHETERIZATION WITH Beatrix Fetters;  Surgeon: Troy Sine, MD;  [facility] C[facility];  Service: Cardiovascular;  Laterality: N/A;  . REIMPLANTATION OF TOTAL KNEE Right 10/08/2014   Procedure: REIMPLANTATION OF RIGHT TOTAL KNEE ARTHROPLASTY WITH REMOVAL OF ANTIBIOTIC SPACER;  Surgeon: Paralee Cancel, MD;  Location: [facility];  Service: Orthopedics;  Laterality: Right;  . ROTATOR CUFF REPAIR Bilateral    r-1999, l- 2005  . TEE WITHOUT CARDIOVERSION N/A 02/28/2014   Procedure: TRANSESOPHAGEAL ECHOCARDIOGRAM (TEE);  Surgeon: Sanda Klein, MD;  Location: [place];  Service: Cardiovascular;  Laterality: N/A;  . TOTAL KNEE ARTHROPLASTY Right 07/02/2014   Procedure: Resection of Infected Right Total Knee Arthroplasty with placement antibiotic spacer;  Surgeon: Mauri Pole, MD;  Location: [facility];  Service: Orthopedics;  Laterality: Right;    Family History  Problem Relation Age of Onset  . Parkinson's disease Mother   . Heart attack Brother   . Arthritis Brother   . Cancer Brother        Unknown  . Breast cancer Neg Hx     Social History   Socioeconomic History  . Marital status: Widowed    Spouse name: Not on file  . Number of children: 2  .  Years of education: 105  . Highest education level: 10th grade  Occupational History  . Not on file  Social Needs  . Financial resource strain: Not very hard  . Food insecurity:    Worry: Never true    Inability: Never true  . Transportation needs:    Medical: No    Non-medical: No  Tobacco Use  . Smoking status: Never Smoker  . Smokeless tobacco: Never Used  Substance and Sexual Activity  . Alcohol use: No  . Drug use: No  . Sexual activity: Not Currently    Birth control/protection: Post-menopausal  Lifestyle  . Physical activity:    Days per week: 7 days     Minutes per session: 10 min  . Stress: Not at all  Relationships  . Social connections:    Talks on phone: More than three times a week    Gets together: More than three times a week    Attends religious service: More than 4 times per year    Active member of club or organization: No    Attends meetings of clubs or organizations: Never    Relationship status: Widowed  . Intimate partner violence:    Fear of current or ex partner: No    Emotionally abused: No    Physically abused: No    Forced sexual activity: No  Other Topics Concern  . 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 Drinks water and one cup of coffee daily.   Religious / Personal Beliefs: Baptist. "I believe Jesus died on the cross to save me [id number] my sins. I am saved by the grace of God."    Other: "I have a good life and am happy. My family is there when I need help."                                                               Outpatient Medications Prior to Visit  Medication Sig Dispense Refill  . acetaminophen (TYLENOL) 500 MG tablet Take 500-1,000 mg by mouth every 6 (six) hours as needed for moderate pain or headache.    . ALPRAZolam (XANAX) 0.5 MG tablet TAKE 1 TABLET BY MOUTH TWICE A DAY AS NEEDED FOR ANXIETY 60 tablet 5  . amiodarone (PACERONE) 100 MG tablet Take 1 tablet (100 mg total) by mouth daily. 90 tablet 3  . apixaban (ELIQUIS) 2.5 MG TABS tablet Take 1 tablet (2.5 mg total) by mouth 2 (two) times daily. 180 tablet 3  . gabapentin (NEURONTIN)  100 MG capsule TAKE 1 CAPSULE (100 MG TOTAL) BY MOUTH 3 (THREE) TIMES DAILY. 270 capsule 3  . Iron-Vitamins (GERITOL COMPLETE PO) Take 1 tablet by mouth daily.    . isosorbide mononitrate (IMDUR) 30 MG 24 hr tablet Take  1 tablet (30 mg total) by mouth daily. 90 tablet 3  . levothyroxine (SYNTHROID, LEVOTHROID) 75 MCG tablet Take 1 tablet (75 mcg total) by mouth daily. 90 tablet 3  . metoprolol succinate (TOPROL-XL) 25 MG 24 hr tablet Take 0.5 tablets (12.5 mg total) by mouth daily for 5 days. 5 tablet 0  . nitroGLYCERIN (NITROSTAT) 0.4 MG SL tablet PLACE 1 TABLET (0.4 MG TOTAL) UNDER THE TONGUE EVERY 5 (FIVE) MINUTES AS NEEDED FOR CHEST PAIN. X 3 doses 25 tablet 1  . Omega-3 Fatty Acids (FISH OIL) 1000 MG CAPS Take 1-2 capsules by mouth See admin instructions. Takes one in the morning and two capsules at night.    Marland Kitchen omeprazole (PRILOSEC) 20 MG capsule TAKE ONE CAPSULE BY MOUTH EVERY DAY 90 capsule 3  . polyethylene glycol powder (GLYCOLAX/MIRALAX) powder MIX 17 G AND TAKE BY MOUTH DAILY. (Patient taking differently: MIX 17 G AND TAKE BY MOUTH DAILY AS NEEDED FOR CONSTIPATION) 527 g 12  . rOPINIRole (REQUIP) 0.25 MG tablet TAKE 1 TABLET BY MOUTH 3 TIMES A DAY 270 tablet 3  . simvastatin (ZOCOR) 20 MG tablet Take 1 tablet (20 mg total) by mouth at bedtime. 90 tablet 3  . traMADol (ULTRAM) 50 MG tablet TAKE 1 TABLET BY MOUTH TWICE A DAY AS NEEDED FOR PAIN 60 tablet 5  . traZODone (DESYREL) 100 MG tablet TAKE 1 TABLET (100 MG TOTAL) BY MOUTH AT BEDTIME. 90 tablet 3  . ciprofloxacin (CIPRO) 500 MG tablet Take 1 tablet (500 mg total) by mouth 2 (two) times daily. 20 tablet 0   No facility-administered medications prior to visit.     Allergies  Allergen Reactions  . Atorvastatin Other (See Comments)    Myalgias in legs  . Crestor [Rosuvastatin] Other (See Comments)    Myalgias in legs  . Morphine And Related Other (See Comments)    "Crazy thoughts, severe headache, sick"  . Prednisone     On two  occasions has caused A fib    ROS Review of Systems    Objective:    Physical Exam  BP 136/68   Pulse 65   Temp 98.1 F (36.7 C) (Oral)   Ht 5\' 1"  (1.549 m)   Wt 134 lb (60.8 kg)   SpO2 97%   BMI 25.32 kg/m  Wt Readings from Last 3 Encounters:  09/28/18 134 lb (60.8 kg)  09/14/18 133 lb 6.4 oz (60.5 kg)  08/01/18 135 lb 6.4 oz (61.4 kg)   Lungs clear Cardiac RRR without m or g Abd benign.  No CVA tenderness  There are no preventive care reminders to display for this patient.  There are no preventive care reminders to display for this patient.  Lab Results  Component Value Date   TSH 6.580 (H) 09/14/2018   Lab Results  Component Value Date   WBC 3.7 08/01/2018   HGB 10.5 (L) 08/01/2018   HCT 31.9 (L) 08/01/2018   MCV 97 08/01/2018   PLT 204 08/01/2018   Lab Results  Component Value Date   NA 142 09/14/2018   K 4.9 09/14/2018   CO2 22 09/14/2018   GLUCOSE 86 09/14/2018   BUN 20 09/14/2018   CREATININE 1.90 (H) 09/14/2018   BILITOT <0.2 09/14/2018   ALKPHOS 83 09/14/2018   AST 24 09/14/2018   ALT 15 09/14/2018   PROT 6.7 09/14/2018   ALBUMIN 4.3 09/14/2018   CALCIUM 9.7 09/14/2018   ANIONGAP 10 09/13/2017   Lab Results  Component Value  Date   CHOL 168 07/08/2018   Lab Results  Component Value Date   HDL 58 07/08/2018   Lab Results  Component Value Date   LDLCALC 84 07/08/2018   Lab Results  Component Value Date   TRIG 130 07/08/2018   Lab Results  Component Value Date   CHOLHDL 2.9 07/08/2018   Lab Results  Component Value Date   HGBA1C 5.8 (H) 07/23/2017      Assessment & Plan:   Problem List Items Addressed This Visit    Urinary tract infection - Primary   Relevant Orders   POCT urinalysis dipstick (Completed)   POCT UA - Microscopic Only (Completed)      No orders of the defined types were placed in this encounter.   Follow-up: No follow-ups on file.    [name], MD

## 2018-10-07 ENCOUNTER — Telehealth: Payer: Self-pay | Admitting: Cardiovascular Disease

## 2018-10-07 ENCOUNTER — Other Ambulatory Visit: Payer: Self-pay | Admitting: Family Medicine

## 2018-10-07 MED ORDER — APIXABAN 2.5 MG PO TABS: 2.5000 mg | ORAL_TABLET | Freq: Two times a day (BID) | ORAL | 0 refills | Status: DC

## 2018-10-07 NOTE — Telephone Encounter (Signed)
New Message   Patient calling the office for samples of medication:   1.  What medication and dosage are you requesting samples for?apixaban (ELIQUIS) 2.5 MG TABS tablet   2.  Are you currently out of this medication? 3 days remaining

## 2018-10-07 NOTE — Telephone Encounter (Signed)
Medication Samples AVAILABLE FOR PICKUP AT THE FRONT DESK; patient made aware an agreeable to pick up.  Drug name: ELIQUIS       Strength: 2.5 MG        Qty: 14 TABLETS  LOT: [id number]  Exp.Date: 08/2019

## 2018-10-11 ENCOUNTER — Telehealth: Payer: Self-pay

## 2018-10-11 NOTE — Telephone Encounter (Signed)
Patient left message wanting to ask PCP about something she saw on TV that may help to ward off the corona virus. Stated it was called PLOROQUINE.   Call back is [phone]  Danley Danker, RN [facility])

## 2018-10-11 NOTE — Telephone Encounter (Signed)
Called.  Interestingly, chloroquine is being studied.  I informed patient that it would be considered experimental, not proven therapy.

## 2018-10-20 ENCOUNTER — Other Ambulatory Visit: Payer: Self-pay | Admitting: Cardiovascular Disease

## 2018-10-20 ENCOUNTER — Other Ambulatory Visit: Payer: Self-pay | Admitting: Internal Medicine

## 2018-10-20 ENCOUNTER — Other Ambulatory Visit: Payer: Self-pay | Admitting: Family Medicine

## 2018-10-20 DIAGNOSIS — I10 Essential (primary) hypertension: Secondary | ICD-10-CM

## 2018-10-20 MED ORDER — APIXABAN 2.5 MG PO TABS: 2.5000 mg | ORAL_TABLET | Freq: Two times a day (BID) | ORAL | 0 refills | Status: DC

## 2018-10-20 MED ORDER — ISOSORBIDE MONONITRATE ER 30 MG PO TB24: 30.0000 mg | ORAL_TABLET | Freq: Every day | ORAL | 3 refills | Status: DC

## 2018-10-20 NOTE — Telephone Encounter (Signed)
°*  STAT* If patient is at the pharmacy, call can be transferred to refill team.   1. Which medications need to be refilled? (please list name of each medication and dose if known)  apixaban (ELIQUIS) 2.5 MG TABS tablet isosorbide mononitrate (IMDUR) 30 MG 24 hr tablet  2. Which pharmacy/location (including street and city if local pharmacy) is medication to be sent to? [id number] - [place], [place]   3. Do they need a 30 day or 90 day supply? [place]

## 2018-10-26 ENCOUNTER — Telehealth: Payer: Self-pay | Admitting: Cardiovascular Disease

## 2018-10-26 MED ORDER — METOPROLOL SUCCINATE ER 25 MG PO TB24: 12.5000 mg | ORAL_TABLET | Freq: Every day | ORAL | 3 refills | Status: DC

## 2018-10-26 NOTE — Telephone Encounter (Signed)
I called her. That is indeed concerning, but I would recommend trying to manage this conservatively for the time being. She should dial 911 if she has >30 min of symptoms, not relieved by 3 SL NTG.  Will ask her to restart metoprolol succinate 12.5 mg daily (half of 25 mg tab daily). Please call in #45 tabs (3 month supply), 3 RF MCr

## 2018-10-26 NOTE — Telephone Encounter (Signed)
New Message:   Patient calling concerning some chest pain she had on yesterday,and would like for some one to call her to see if it is something else she can do. Please call patient.

## 2018-10-26 NOTE — Telephone Encounter (Signed)
Pt notified script for Metoprolol sent via epic to pharmacy ./cy

## 2018-10-26 NOTE — Telephone Encounter (Signed)
Spoke with pt and Tuesday at 2:00 am was awakened with chest pain and SOB and had taken 3 ntg with relief from pain but still had pressure Per pt felt fine rest of day yesterday and today but does note fatigue  Vital signs from yesterday B/P 142/62 HR 71 and weight 129 Will forward to Dr [name] for review and recommendations ./cy

## 2018-11-24 ENCOUNTER — Telehealth: Payer: Self-pay | Admitting: Family Medicine

## 2018-11-24 DIAGNOSIS — R42 Dizziness and giddiness: Secondary | ICD-10-CM | POA: Insufficient documentation

## 2018-11-24 HISTORY — DX: Dizziness and giddiness: R42

## 2018-11-24 MED ORDER — MECLIZINE HCL 12.5 MG PO TABS: 12.5000 mg | ORAL_TABLET | Freq: Three times a day (TID) | ORAL | 2 refills | Status: DC | PRN

## 2018-11-24 NOTE — Assessment & Plan Note (Signed)
Low dose meclizine with cautions.

## 2018-11-24 NOTE — Telephone Encounter (Signed)
Patient called with concerns about sudden onset of vertigo today  (solid diagnosis, she clearly described room spinning.)  Will do low dose meclizine.  Cautioned about fall risk on and off the medication.  She will use her walker for balance.

## 2018-12-09 ENCOUNTER — Other Ambulatory Visit: Payer: Self-pay | Admitting: Family Medicine

## 2018-12-09 DIAGNOSIS — F4329 Adjustment disorder with other symptoms: Secondary | ICD-10-CM

## 2018-12-22 ENCOUNTER — Telehealth: Payer: Self-pay | Admitting: Cardiovascular Disease

## 2018-12-22 NOTE — Telephone Encounter (Signed)
New Message   Patient returning your call for Pre Reg.

## 2018-12-22 NOTE — Telephone Encounter (Signed)
No mychart, not sure if she has a smartphone, consent (verbal), pre reg complete 12/22/18 AF

## 2018-12-23 ENCOUNTER — Telehealth: Payer: Self-pay | Admitting: *Deleted

## 2018-12-23 NOTE — Telephone Encounter (Signed)

## 2018-12-26 ENCOUNTER — Encounter: Payer: Self-pay | Admitting: Cardiovascular Disease

## 2018-12-26 ENCOUNTER — Telehealth (INDEPENDENT_AMBULATORY_CARE_PROVIDER_SITE_OTHER): Payer: PPO | Admitting: Cardiovascular Disease

## 2018-12-26 VITALS — BP 130/59 | HR 61 | Ht 61.0 in | Wt 127.0 lb

## 2018-12-26 DIAGNOSIS — I480 Paroxysmal atrial fibrillation: Secondary | ICD-10-CM | POA: Diagnosis not present

## 2018-12-26 DIAGNOSIS — I25728 Atherosclerosis of autologous artery coronary artery bypass graft(s) with other forms of angina pectoris: Secondary | ICD-10-CM

## 2018-12-26 DIAGNOSIS — E785 Hyperlipidemia, unspecified: Secondary | ICD-10-CM

## 2018-12-26 DIAGNOSIS — I700 Atherosclerosis of aorta: Secondary | ICD-10-CM

## 2018-12-26 DIAGNOSIS — I495 Sick sinus syndrome: Secondary | ICD-10-CM

## 2018-12-26 DIAGNOSIS — Z5181 Encounter for therapeutic drug level monitoring: Secondary | ICD-10-CM

## 2018-12-26 DIAGNOSIS — I10 Essential (primary) hypertension: Secondary | ICD-10-CM

## 2018-12-26 DIAGNOSIS — I5032 Chronic diastolic (congestive) heart failure: Secondary | ICD-10-CM | POA: Diagnosis not present

## 2018-12-26 DIAGNOSIS — Z951 Presence of aortocoronary bypass graft: Secondary | ICD-10-CM

## 2018-12-26 DIAGNOSIS — I2721 Secondary pulmonary arterial hypertension: Secondary | ICD-10-CM

## 2018-12-26 DIAGNOSIS — Z79899 Other long term (current) drug therapy: Secondary | ICD-10-CM | POA: Diagnosis not present

## 2018-12-26 DIAGNOSIS — Z7901 Long term (current) use of anticoagulants: Secondary | ICD-10-CM | POA: Diagnosis not present

## 2018-12-26 DIAGNOSIS — I25708 Atherosclerosis of coronary artery bypass graft(s), unspecified, with other forms of angina pectoris: Secondary | ICD-10-CM

## 2018-12-26 DIAGNOSIS — N184 Chronic kidney disease, stage 4 (severe): Secondary | ICD-10-CM | POA: Diagnosis not present

## 2018-12-26 NOTE — Progress Notes (Signed)
Virtual Visit via Telephone Note   This visit type was conducted due to national recommendations for restrictions regarding the COVID-19 Pandemic (e.g. social distancing) in an effort to limit this patient's exposure and mitigate transmission in our community.  Due to her co-morbid illnesses, this patient is at least at moderate risk for complications without adequate follow up.  This format is felt to be most appropriate for this patient at this time.  The patient did not have access to video technology/had technical difficulties with video requiring transitioning to audio format only (telephone).  All issues noted in this document were discussed and addressed.  No physical exam could be performed with this format.  Please refer to the patient's chart for her  consent to telehealth for [facility].   Date:  12/26/2018   ID:  Jacqueline [name], DOB 01/02/1931, MRN [id number]  Patient Location: Home Provider Location: Home  PCP:  [name], MD  Cardiologist:  Nury Nebergall Electrophysiologist:  None   Evaluation Performed:  Follow-Up Visit  Chief Complaint:  F/U CAD, CHF, AFib  History of Present Illness:    Jacqueline [name] is a 85 y.o. female with coronary artery disease and previous bypass surgery, paroxysmal atrial fibrillation requiring previous cardioversion, diastolic heart failure, HTN.  She continues to live alone and is able to take care of all her household chores without assistance.  She stops and rests a couple of times when vacuuming.  Her back pain limits for more than chest pain or dyspnea.  Her sons do the grocery shopping and she is careful to limit contact with the outside world.  The patient specifically denies any chest pain at rest exertion, dyspnea at rest or with exertion, orthopnea, paroxysmal nocturnal dyspnea, syncope, palpitations, focal neurological deficits, intermittent claudication, lower extremity edema, unexplained weight gain, cough, hemoptysis or wheezing.    The patient does not have symptoms concerning for COVID-19 infection (fever, chills, cough, or new shortness of breath).    Past Medical History:  Diagnosis Date  . Arthritis    back  . Atrial fibrillation with rapid ventricular response ([place]) 02/2014   a. CHA2DS2VASc = 6 -> on eliquis;  b. 02/2014 s/p DCCV;  c. 08/2014 Echo: EF 55-60%, Gr 2 DD, mild MR, triv AI.  Marland Kitchen CAD (coronary artery disease)    a. s/p CABG x 2 (LIMA->LAD, VG->Diag);  b. 08/2014 Cath: LM nl, LAD 90p, LCX nl, RCA nl/dominant, LIMA->LAD atretic, VG->D2 patent w/ retrograde filling of LAD, EF 55-60%-->Med Rx.  . Diverticulitis 02/21/2012  . GERD (gastroesophageal reflux disease)   . Hyperlipemia   . Hypertension   . Insomnia    Past Surgical History:  Procedure Laterality Date  . ABDOMINAL HYSTERECTOMY  1975  . CARDIAC CATHETERIZATION N/A 05/09/2015   Procedure: Right Heart Cath;  Surgeon: Larey Dresser, MD;  Location: [facility];  Service: Cardiovascular;  Laterality: N/A;  . CARDIOVERSION N/A 02/28/2014   Procedure: CARDIOVERSION;  Surgeon: Sanda Klein, MD;  Location: [facility];  Service: Cardiovascular;  Laterality: N/A;  . CATARACT EXTRACTION Bilateral 2014   with lens implanted  . COLONOSCOPY N/A 02/19/2015   Procedure: COLONOSCOPY;  Surgeon: Ladene Artist, MD;  Location: [facility] ENDOSCOPY;  Service: Endoscopy;  Laterality: N/A;  . CORONARY ARTERY BYPASS GRAFT  2006   off-pump bypass surgery with LIMA to the LAD and SVG to the second diagonal artery ( performed by Dr Servando Snare)   . JOINT REPLACEMENT Bilateral    L-2004, R-2006  .  LEFT HEART CATHETERIZATION WITH CORONARY/GRAFT ANGIOGRAM N/A 09/17/2014   Procedure: LEFT HEART CATHETERIZATION WITH Beatrix Fetters;  Surgeon: Troy Sine, MD;  Location[facility];  Service: Cardiovascular;  Laterality: N/A;  . REIMPLANTATION OF TOTAL KNEE Right 10/08/2014   Procedure: REIMPLANTATION OF RIGHT TOTAL KNEE ARTHROPLASTY WITH REMOVAL OF ANTIBIOTIC  SPACER;  Surgeon: Paralee Cancel, MD;  Location: [facility]S;  Service: Orthopedics;  Laterality: Right;  . ROTATOR CUFF REPAIR Bilateral    r-1999, l- 2005  . TEE WITHOUT CARDIOVERSION N/A 02/28/2014   Procedure: TRANSESOPHAGEAL ECHOCARDIOGRAM (TEE);  Surgeon: Sanda Klein, MD;  Location: [facility];  Service: Cardiovascular;  Laterality: N/A;  . TOTAL KNEE ARTHROPLASTY Right 07/02/2014   Procedure: Resection of Infected Right Total Knee Arthroplasty with placement antibiotic spacer;  Surgeon: Mauri Pole, MD;  Location: [facility] ORS;  Service: Orthopedics;  Laterality: Right;     Current Meds  Medication Sig  . acetaminophen (TYLENOL) 500 MG tablet Take 500-1,000 mg by mouth every 6 (six) hours as needed for moderate pain or headache.  . ALPRAZolam (XANAX) 0.5 MG tablet TAKE 1 TABLET BY MOUTH TWICE A DAY AS NEEDED FOR ANXIETY  . amiodarone (PACERONE) 100 MG tablet Take 1 tablet (100 mg total) by mouth daily.  Marland Kitchen apixaban (ELIQUIS) 2.5 MG TABS tablet Take 1 tablet (2.5 mg total) by mouth 2 (two) times daily.  Marland Kitchen gabapentin (NEURONTIN) 100 MG capsule TAKE 1 CAPSULE (100 MG TOTAL) BY MOUTH 3 (THREE) TIMES DAILY.  Marland Kitchen Iron-Vitamins (GERITOL COMPLETE PO) Take 1 tablet by mouth daily.  . isosorbide mononitrate (IMDUR) 30 MG 24 hr tablet Take 1 tablet (30 mg total) by mouth daily.  Marland Kitchen levothyroxine (SYNTHROID, LEVOTHROID) 75 MCG tablet Take 1 tablet (75 mcg total) by mouth daily.  . meclizine (ANTIVERT) 12.5 MG tablet Take 1 tablet (12.5 mg total) by mouth 3 (three) times daily as needed for dizziness.  . metoprolol succinate (TOPROL-XL) 25 MG 24 hr tablet Take 0.5 tablets (12.5 mg total) by mouth daily.  . nitroGLYCERIN (NITROSTAT) 0.4 MG SL tablet PLACE 1 TABLET UNDER THE TONGUE EVERY 5 MINUTES AS NEEDED FOR CHEST PAIN UP TO 3 DOSES  . Omega-3 Fatty Acids (FISH OIL) 1000 MG CAPS Take 1-2 capsules by mouth See admin instructions. Takes one in the morning and two capsules at night.  Marland Kitchen omeprazole (PRILOSEC) 20 MG  capsule TAKE ONE CAPSULE BY MOUTH EVERY DAY  . polyethylene glycol powder (GLYCOLAX/MIRALAX) powder MIX 17 G AND TAKE BY MOUTH DAILY. (Patient taking differently: MIX 17 G AND TAKE BY MOUTH DAILY AS NEEDED FOR CONSTIPATION)  . rOPINIRole (REQUIP) 0.25 MG tablet TAKE 1 TABLET BY MOUTH 3 TIMES A DAY  . simvastatin (ZOCOR) 20 MG tablet TAKE 1 TABLET BY MOUTH EVERYDAY AT BEDTIME  . traMADol (ULTRAM) 50 MG tablet TAKE 1 TABLET BY MOUTH TWICE A DAY AS NEEDED FOR PAIN  . traZODone (DESYREL) 100 MG tablet TAKE 1 TABLET (100 MG TOTAL) BY MOUTH AT BEDTIME.     Allergies:   Atorvastatin; Crestor [rosuvastatin]; Morphine and related; and Prednisone   Social History   Tobacco Use  . Smoking status: Never Smoker  . Smokeless tobacco: Never Used  Substance Use Topics  . Alcohol use: No  . Drug use: No     Family Hx: The patient's family history includes Arthritis in her brother; Cancer in her brother; Heart attack in her brother; Parkinson's disease in her mother. There is no history of Breast cancer.  ROS:  Please see the history of present illness.     All other systems reviewed and are negative.   Prior CV studies:   The following studies were reviewed today:  Notes from visit with Dr. Andria Frames  Labs/Other Tests and Data Reviewed:    EKG:  An ECG dated 07/11/2018 was personally reviewed today and demonstrated:  Normal sinus rhythm, no repolarization abnormalities  Recent Labs: 08/01/2018: Hemoglobin 10.5; Platelets 204 09/14/2018: ALT 15; BUN 20; Creatinine, Ser 1.90; Potassium 4.9; Sodium 142; TSH 6.580   Recent Lipid Panel Lab Results  Component Value Date/Time   CHOL 168 07/08/2018 03:18 PM   TRIG 130 07/08/2018 03:18 PM   HDL 58 07/08/2018 03:18 PM   CHOLHDL 2.9 07/08/2018 03:18 PM   CHOLHDL 2.8 04/08/2016 11:53 AM   LDLCALC 84 07/08/2018 03:18 PM    Wt Readings from Last 3 Encounters:  12/26/18 127 lb (57.6 kg)  09/28/18 134 lb (60.8 kg)  09/14/18 133 lb 6.4 oz (60.5 kg)      Objective:    Vital Signs:  BP (!) 130/59   Pulse 61   Ht 5\' 1"  (1.549 m)   Wt 127 lb (57.6 kg)   BMI 24.00 kg/m    VITAL SIGNS:  reviewed Unable to examine.  ASSESSMENT & PLAN:    1. AFib/flutter:  She has not had any recurrent palpitations or other symptoms of arrhythmia.  On very low-dose amiodarone.  Tolerating anticoagulation.  CHADSVasc 5 (age 39, gender, CAD, CHF). 2. Amiodarone:  Normal liver function tests in February, with borderline elevated TSH.  Already on levothyroxine supplementation.  Check free T4 next time she has labs.  She does have some fatigue and cold intolerance, but these are not of recent onset. 3. Eliquis: No recent falls, injuries or bleeding problems.  Remote history of GI bleeding when on anticoagulation, but without any bleeding in the recent past.  Anticoagulant dose is reduced based on age, renal function and low body size. 4. CAD:  Currently asymptomatic, angina free on medications. 5. CHF:  No evidence of edema and her weight is actually drifting down slowly.  NYHA functional class I-2 limited more by back pain and by cardiac symptoms. 6. PAH:  Most likely secondary to left heart diastolic failure. 7. CKD: Stable when last checked.  Creatinine 1.9.  Estimated creatinine clearance just under 30. 8. HLP: LDL was a little higher than usual last December at 11, but typically her LDL is 69-70. On statin.  Target LDL less than 70. 9. HTN:  Well controlled, no symptoms of orthostatic hypotension recently.  COVID-19 Education: The signs and symptoms of COVID-19 were discussed with the patient and how to seek care for testing (follow up with PCP or arrange E-visit).  The importance of social distancing was discussed today.  Time:   Today, I have spent 14 minutes with the patient with telehealth technology discussing the above problems.     Medication Adjustments/Labs and Tests Ordered: Current medicines are reviewed at length with the patient today.   Concerns regarding medicines are outlined above.   Tests Ordered: No orders of the defined types were placed in this encounter.   Medication Changes: No orders of the defined types were placed in this encounter.  There are no Patient Instructions on file for this visit. Disposition:  Follow up December 2020  SignedSanda Klein, MD  12/26/2018 11:35 AM    [place]

## 2018-12-26 NOTE — Patient Instructions (Signed)
Medication Instructions:  Your physician recommends that you continue on your current medications as directed. Please refer to the Current Medication list given to you today.  If you need a refill on your cardiac medications before your next appointment, please call your pharmacy.   Lab work: Your provider would like for you to return in August to have the following labs drawn: Free T4, TSH and a CMET. You do not need an appointment for the lab. Once in our office lobby there is a podium where you can sign in and ring the doorbell to alert [place] that you are here. The lab is open from 8:00 am to 4:30 pm; closed for lunch from 12:45pm-1:45pm.  Testing/Procedures: None ordered  Follow-Up: At [facility], you and your health needs are our priority.  As part of our continuing mission to provide you with exceptional heart care, we have created designated Provider Care Teams.  These Care Teams include your primary Cardiologist (physician) and Advanced Practice Providers (APPs -  Physician Assistants and Nurse Practitioners) who all work together to provide you with the care you need, when you need it. You will need a follow up appointment in 3 months.  Please call our office 2 months in advance to schedule this appointment.  You may see Sanda Klein, MD or one of the following Advanced Practice Providers on your designated Care Team: Almyra Deforest, PA-C Fabian Sharp, [place]

## 2019-01-02 ENCOUNTER — Other Ambulatory Visit: Payer: Self-pay | Admitting: Cardiovascular Disease

## 2019-01-02 NOTE — Telephone Encounter (Signed)
New Message     *STAT* If patient is at the pharmacy, call can be transferred to refill team.   1. Which medications need to be refilled? (please list name of each medication and dose if known) Eliquis 2.5mg  1 tablet twice a day   2. Which pharmacy/location (including street and city if local pharmacy) is medication to be sent to? [facility] on [address]  3. Do they need a 30 day or 90 day supply? [place]

## 2019-01-03 ENCOUNTER — Other Ambulatory Visit: Payer: Self-pay | Admitting: Family Medicine

## 2019-01-03 ENCOUNTER — Telehealth: Payer: Self-pay | Admitting: Cardiovascular Disease

## 2019-01-03 MED ORDER — APIXABAN 2.5 MG PO TABS: 2.5000 mg | ORAL_TABLET | Freq: Two times a day (BID) | ORAL | 1 refills | Status: DC

## 2019-01-03 NOTE — Telephone Encounter (Signed)
This has been addressed. Please see previous telephone/refill encounter.

## 2019-01-03 NOTE — Telephone Encounter (Signed)
New Message       *STAT* If patient is at the pharmacy, call can be transferred to refill team.   1. Which medications need to be refilled? (please list name of each medication and dose if known) Eliquis   2. Which pharmacy/location (including street and city if local pharmacy) is medication to be sent to? [facility] [address]   3. Do they need a 30 day or 90 day supply? 90 day

## 2019-01-04 ENCOUNTER — Other Ambulatory Visit: Payer: Self-pay | Admitting: Family Medicine

## 2019-01-04 DIAGNOSIS — K219 Gastro-esophageal reflux disease without esophagitis: Secondary | ICD-10-CM

## 2019-01-13 ENCOUNTER — Telehealth: Payer: Self-pay | Admitting: Cardiovascular Disease

## 2019-01-13 NOTE — Telephone Encounter (Signed)
I agree no changes are necessary if she has no serious symptoms. Breakthrough arrhythmia while on amiodarone is not unexpected. Would ask her to call again if she has persistently elevated HR> 100 for 3 or more consecutive days.

## 2019-01-13 NOTE — Telephone Encounter (Signed)
Spoke with patient of Dr. C who reports her HR is 128 today and was in the 120s yesterday - prior days this week her HR was in the 34s. Her BP 103/73 - this is normal for her. She is compliant with amiodarone, metoprolol, eliquis. She was on metoprolol succinate 25mg  (whole tab) previously. She has no symptoms with the fast heart rate other than she feels weak. She has to check her vitals daily which is the only reason she knows her HR is up, per report  Routed to DOD since MD is out of the office.

## 2019-01-13 NOTE — Telephone Encounter (Signed)
If she is having no symptoms then no changes need to be made at this point.  Peter Martinique MD, [facility]

## 2019-01-13 NOTE — Telephone Encounter (Signed)
Patient aware of MD advice. Routed to primary cardiologist as Juluis Rainier

## 2019-01-14 NOTE — Telephone Encounter (Signed)
Patient called back today, Saturday 6/20, with concerns regarding persistently elevated heart rate as high as the 130s.  Overall she feels well.  In no acute distress.  Speaking in clear complete sentences.  Speech unlabored.  Breathing unlabored.  Denies lightheadedness, dizziness, syncope/near syncope, dyspnea or chest pain.  She has already taken a half a tablet of metoprolol this morning.  Current blood pressure 140/92.  Patient advised to take an extra half tablet of metoprolol and to avoid activity and caffeine.  She will call our office if she develops any symptoms or hypotension.  We will have triage follow-up with patient Monday morning to reassess.

## 2019-01-16 MED ORDER — AMIODARONE HCL 100 MG PO TABS: ORAL_TABLET | ORAL | 3 refills | Status: DC

## 2019-01-16 NOTE — Telephone Encounter (Signed)
Spoke to DOD [facility] he advised to increase Amiodarone to 200 mg daily.Advised to return to normal dose of metoprolol 25 mg 1/2 tablet daily.He advised to see Dr.Croitrou in 2 weeks.She only wants to see Dr.Croitoru.Advised I will send message to Dr.Croitoru's RN to schedule appointment.Advised to continue to monitor B/P and pulse and call back if heart rate still elevated.

## 2019-01-16 NOTE — Telephone Encounter (Signed)
Patient called wanting to know if she should continue to taking the whole pills of the metoprolol, she use to take half a pill.

## 2019-01-16 NOTE — Addendum Note (Signed)
Addended by: Kathyrn Lass on: 01/16/2019 01:39 PM   Modules accepted: Orders

## 2019-01-16 NOTE — Telephone Encounter (Signed)
Spoke with pt, she has taken metoprolol 25 mg once daily for the last 3 days. This morning her bp is 99-98/65-64 and her pulse about 20 min ago was 129 bpm. She reports feeling weak and dizzy. Will get on site triage nurse to discuss with provider in the office.

## 2019-01-16 NOTE — Telephone Encounter (Signed)
Agree with increased amiodarone. She needs an ECG. The earliest I can see is Monday June 29. Can we bring in just for an ECG sooner, please?

## 2019-01-16 NOTE — Telephone Encounter (Signed)
Routed to NL triage

## 2019-01-17 ENCOUNTER — Ambulatory Visit (INDEPENDENT_AMBULATORY_CARE_PROVIDER_SITE_OTHER): Payer: PPO | Admitting: Cardiology

## 2019-01-17 ENCOUNTER — Other Ambulatory Visit: Payer: Self-pay

## 2019-01-17 ENCOUNTER — Encounter: Payer: Self-pay | Admitting: Cardiology

## 2019-01-17 DIAGNOSIS — I480 Paroxysmal atrial fibrillation: Secondary | ICD-10-CM

## 2019-01-17 DIAGNOSIS — Z951 Presence of aortocoronary bypass graft: Secondary | ICD-10-CM

## 2019-01-17 DIAGNOSIS — N184 Chronic kidney disease, stage 4 (severe): Secondary | ICD-10-CM | POA: Diagnosis not present

## 2019-01-17 DIAGNOSIS — I2721 Secondary pulmonary arterial hypertension: Secondary | ICD-10-CM | POA: Diagnosis not present

## 2019-01-17 DIAGNOSIS — Z7901 Long term (current) use of anticoagulants: Secondary | ICD-10-CM

## 2019-01-17 NOTE — Assessment & Plan Note (Signed)
CHADS VASC=6- on Eliquis low dose based on weight and CRi

## 2019-01-17 NOTE — Assessment & Plan Note (Signed)
CABG x 2 2006- Cath in 2016 showed occluded LIMA to LAD with a patent SVG-Dx that filled the LAD. Myoview low risk Jan 2019

## 2019-01-17 NOTE — Assessment & Plan Note (Signed)
SCr 1.8 in Feb 2020

## 2019-01-17 NOTE — Progress Notes (Signed)
Cardiology Office Note:    Date:  01/17/2019   ID:  Jacqueline [name], DOB 1930-11-02, MRN [id number]  PCP:  [name], MD  Cardiologist:  Dr [name] Electrophysiologist:  None   Referring MD: [name], MD   Chief Complaint  Patient presents with  . Follow-up    nurse visit, turned into office visit     History of Present Illness:    Jacqueline [name] is a 85 y.o. female with a hx of coronary disease.  She had bypass grafting in 2006.  Catheterization was done in 2016 and she was treated medically.  She had a low risk Myoview in January 2019.  She has had a history of PAF with tachybradycardia response.  She has been on low-dose Eliquis based on her age and chronic renal insufficiency.  The patient was eventually placed on amiodarone.  She held sinus rhythm but was bradycardic.  Amiodarone was ultimately decreased to 100 mg a day.  She has recently had tachycardia at home.  Her heart rate runs anywhere from 120-130.  She is tolerated this well, she has generalized weakness but denies any chest pain or unusual shortness of breath.  She is not had syncope or near syncope.  Dr. [name] brought her in the office today for an EKG.  She was added onto my schedule.  Her EKG is read as sinus tachycardia at 130 but I believe this is atrial flutter.  She was told to increase her amiodarone to 200 mg a day that was only yesterday.  She is otherwise unaware of any tachycardia.  Past Medical History:  Diagnosis Date  . Arthritis    back  . Atrial fibrillation with rapid ventricular response ([place]) 02/2014   a. CHA2DS2VASc = 6 -> on eliquis;  b. 02/2014 s/p DCCV;  c. 08/2014 Echo: EF 55-60%, Gr 2 DD, mild MR, triv AI.  Marland Kitchen CAD (coronary artery disease)    a. s/p CABG x 2 (LIMA->LAD, VG->Diag);  b. 08/2014 Cath: LM nl, LAD 90p, LCX nl, RCA nl/dominant, LIMA->LAD atretic, VG->D2 patent w/ retrograde filling of LAD, EF 55-60%-->Med Rx.  . Diverticulitis 02/21/2012  . GERD (gastroesophageal reflux  disease)   . Hyperlipemia   . Hypertension   . Insomnia     Past Surgical History:  Procedure Laterality Date  . ABDOMINAL HYSTERECTOMY  1975  . CARDIAC CATHETERIZATION N/A 05/09/2015   Procedure: Right Heart Cath;  Surgeon: Larey Dresser, MD;  Location: [place] [facility];  Service: Cardiovascular;  Laterality: N/A;  . CARDIOVERSION N/A 02/28/2014   Procedure: CARDIOVERSION;  Surgeon: Sanda Klein, MD;  Location: [place] [facility];  Service: Cardiovascular;  Laterality: N/A;  . CATARACT EXTRACTION Bilateral 2014   with lens implanted  . COLONOSCOPY N/A 02/19/2015   Procedure: COLONOSCOPY;  Surgeon: Ladene Artist, MD;  Location[facility] ENDOSCOPY;  Service: Endoscopy;  Laterality: N/A;  . CORONARY ARTERY BYPASS GRAFT  2006   off-pump bypass surgery with LIMA to the LAD and SVG to the second diagonal artery ( performed by Dr Servando Snare)   . JOINT REPLACEMENT Bilateral    L-2004, R-2006  . LEFT HEART CATHETERIZATION WITH CORONARY/GRAFT ANGIOGRAM N/A 09/17/2014   Procedure: LEFT HEART CATHETERIZATION WITH Beatrix Fetters;  Surgeon: Troy Sine, MD;  Location: [facility] CATH LAB;  Service: Cardiovascular;  Laterality: N/A;  . REIMPLANTATION OF TOTAL KNEE Right 10/08/2014   Procedure: REIMPLANTATION OF RIGHT TOTAL KNEE ARTHROPLASTY WITH REMOVAL OF ANTIBIOTIC SPACER;  Surgeon: Paralee Cancel, MD;  Location: [facility];  Service: Orthopedics;  Laterality: Right;  . ROTATOR CUFF REPAIR Bilateral    r-1999, l- 2005  . TEE WITHOUT CARDIOVERSION N/A 02/28/2014   Procedure: TRANSESOPHAGEAL ECHOCARDIOGRAM (TEE);  Surgeon: Sanda Klein, MD;  Location: [place];  Service: Cardiovascular;  Laterality: N/A;  . TOTAL KNEE ARTHROPLASTY Right 07/02/2014   Procedure: Resection of Infected Right Total Knee Arthroplasty with placement antibiotic spacer;  Surgeon: Mauri Pole, MD;  Location: [facility] OR[facility];  Service: Orthopedics;  Laterality: Right;    Current Medications: Current Meds  Medication Sig  . acetaminophen  (TYLENOL) 500 MG tablet Take 500-1,000 mg by mouth every 6 (six) hours as needed for moderate pain or headache.  . ALPRAZolam (XANAX) 0.5 MG tablet TAKE 1 TABLET BY MOUTH TWICE A DAY AS NEEDED FOR ANXIETY  . amiodarone (PACERONE) 100 MG tablet Take 2 tablets ( 200 mg ) daily  . apixaban (ELIQUIS) 2.5 MG TABS tablet Take 1 tablet (2.5 mg total) by mouth 2 (two) times daily.  Marland Kitchen gabapentin (NEURONTIN) 100 MG capsule TAKE 1 CAPSULE (100 MG TOTAL) BY MOUTH 3 (THREE) TIMES DAILY.  Marland Kitchen Iron-Vitamins (GERITOL COMPLETE PO) Take 1 tablet by mouth daily.  . isosorbide mononitrate (IMDUR) 30 MG 24 hr tablet Take 1 tablet (30 mg total) by mouth daily.  Marland Kitchen levothyroxine (SYNTHROID, LEVOTHROID) 75 MCG tablet Take 1 tablet (75 mcg total) by mouth daily.  . meclizine (ANTIVERT) 12.5 MG tablet Take 1 tablet (12.5 mg total) by mouth 3 (three) times daily as needed for dizziness.  . metoprolol succinate (TOPROL-XL) 25 MG 24 hr tablet Take 0.5 tablets (12.5 mg total) by mouth daily.  . nitroGLYCERIN (NITROSTAT) 0.4 MG SL tablet PLACE 1 TABLET UNDER THE TONGUE EVERY 5 MINUTES AS NEEDED FOR CHEST PAIN UP TO 3 DOSES  . Omega-3 Fatty Acids (FISH OIL) 1000 MG CAPS Take 1-2 capsules by mouth See admin instructions. Takes one in the morning and two capsules at night.  Marland Kitchen omeprazole (PRILOSEC) 20 MG capsule TAKE ONE CAPSULE BY MOUTH EVERY DAY  . polyethylene glycol powder (GLYCOLAX/MIRALAX) powder MIX 17 G AND TAKE BY MOUTH DAILY. (Patient taking differently: MIX 17 G AND TAKE BY MOUTH DAILY AS NEEDED FOR CONSTIPATION)  . rOPINIRole (REQUIP) 0.25 MG tablet TAKE 1 TABLET BY MOUTH 3 TIMES A DAY  . simvastatin (ZOCOR) 20 MG tablet TAKE 1 TABLET BY MOUTH EVERYDAY AT BEDTIME  . traMADol (ULTRAM) 50 MG tablet TAKE 1 TABLET BY MOUTH TWICE A DAY AS NEEDED FOR PAIN  . traZODone (DESYREL) 100 MG tablet TAKE 1 TABLET (100 MG TOTAL) BY MOUTH AT BEDTIME.     Allergies:   Atorvastatin, Crestor [rosuvastatin], Morphine and related, and  Prednisone   Social History   Socioeconomic History  . Marital status: Widowed    Spouse name: Not on file  . Number of children: 2  . Years of education: 10  . Highest education level: 10th grade  Occupational History  . Not on file  Social Needs  . Financial resource strain: Not very hard  . Food insecurity    Worry: Never true    Inability: Never true  . Transportation needs    Medical: No    Non-medical: No  Tobacco Use  . Smoking status: Never Smoker  . Smokeless tobacco: Never Used  Substance and Sexual Activity  . Alcohol use: No  . Drug use: No  . Sexual activity: Not Currently    Birth control/protection: Post-menopausal  Lifestyle  . Physical  activity    Days per week: 7 days    Minutes per session: 10 min  . Stress: Not at all  Relationships  . Social connections    Talks on phone: More than three times a week    Gets together: More than three times a week    Attends religious service: More than 4 times per year    Active member of club or organization: No    Attends meetings of clubs or organizations: Never    Relationship status: Widowed  Other Topics Concern  . 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 Drinks water and one cup of coffee daily.   Religious / Personal Beliefs: Baptist. "I believe Jesus died on the cross to save me [id number] my sins. I am saved by the grace of God."    Other: "I have a good life and am happy. My family is there when I need help."                                                                 Family History: The patient's family history includes Arthritis in her brother; Cancer in her brother; Heart attack in her brother; Parkinson's disease in her mother. There is no history of Breast cancer.  ROS:   Please see the history of present illness. All other systems reviewed and are negative.  EKGs/Labs/Other Studies Reviewed:    EKG:  EKG is ordered today.  The ekg ordered today demonstrates atrial flutter with VR 130  Recent Labs: 08/01/2018: Hemoglobin 10.5; Platelets 204 09/14/2018: ALT 15; BUN 20; Creatinine, Ser 1.90; Potassium 4.9; Sodium 142; TSH 6.580  Recent Lipid Panel    Component Value Date/Time   CHOL 168 07/08/2018 1518   TRIG 130 07/08/2018 1518   HDL 58 07/08/2018 1518   CHOLHDL 2.9 07/08/2018 1518   CHOLHDL 2.8 04/08/2016 1153   VLDL 26 04/08/2016 1153   LDLCALC 84 07/08/2018 1518    Physical Exam:    VS:  BP (!) 159/107   Pulse (!) 134   Ht 5\' 1"  (1.549 m)   Wt 132 lb (59.9 kg)   BMI 24.94 kg/m     Wt Readings from Last 3 Encounters:  01/17/19 132 lb (59.9 kg)  12/26/18 127 lb (57.6 kg)  09/28/18 134 lb (60.8 kg)     GEN: Well nourished elderly female, well developed in no acute distress HEENT: Normal NECK: No JVD; No carotid bruits LYMPHATICS: No lymphadenopathy CARDIAC: irregularly irregular with increased VR, no murmurs, rubs, gallops RESPIRATORY:  Clear to auscultation without rales, wheezing or rhonchi  ABDOMEN: Soft, non-tender, non-distended MUSCULOSKELETAL:  No edema; No deformity  SKIN: Warm and dry NEUROLOGIC:  Alert and oriented x 3 PSYCHIATRIC:  Normal affect  ASSESSMENT:    Paroxysmal atrial fibrillation (HCC) Recurrent PAF Dec 2018-placed on Amiodarone. Pt has baseline bradycardia.  Amiodarone decreased but increased 24 hours ago secondary to increased HR for two weeks.   Long term (current) use of anticoagulants CHADS VASC=6- on Eliquis low dose based on weight and CRi  Hx of CABG CABG x  2 2006- Cath in 2016 showed occluded LIMA to LAD with a patent SVG-Dx that filled the LAD. Myoview low risk Jan 2019  CKD (chronic kidney disease) stage 4, GFR 15-29 ml/min (HCC) SCr 1.8 in Feb 2020  Pulmonary arterial hypertension ([place]) 0349 TTE: Systolic pressure was severely increased. PA  peak pressure: 85 mm Hg   PLAN:    Atrial flutter- Amiodarone was just increased.  She is tolerating this well.  Increase Toprol to 25 mg a day for 3 days then go back to 12.5 mg daily.  I'll discuss cardioversion with Dr [name].  She is signifcantly bradycardic when in NSR.  She may require a pacemaker soon.       Medication Adjustments/Labs and Tests Ordered: Current medicines are reviewed at length with the patient today.  Concerns regarding medicines are outlined above.  No orders of the defined types were placed in this encounter.  No orders of the defined types were placed in this encounter.   Patient Instructions  Medication Instructions:  INCREASE Toprol to 25mg  Take 1 whole tablet for 3 days then go back to half tablet daily CONTINUE Amiodarone If you need a refill on your cardiac medications before your next appointment, please call your pharmacy.   Lab work: None  If you have labs (blood work) drawn today and your tests are completely normal, you will receive your results only by: Marland Kitchen MyChart Message (if you have MyChart) OR . A paper copy in the mail If you have any lab test that is abnormal or we need to change your treatment, we will call you to review the results.  Testing/Procedures: None   Follow-Up: At [facility], you and your health needs are our priority.  As part of our continuing mission to provide you with exceptional heart care, we have created designated Provider Care Teams.  These Care Teams include your primary Cardiologist (physician) and Advanced Practice Providers (APPs -  Physician Assistants and Nurse Practitioners) who all work together to provide  you with the care you need, when you need it. FOLLOW UP IS PENDING AFTER Sada Mazzoni SPEAKS WITH DR [name]  Any Other Special Instructions Will Be Listed Below (If Applicable).      Angelena Form, PA-C  01/17/2019 12:57 PM    [facility]

## 2019-01-17 NOTE — Telephone Encounter (Signed)

## 2019-01-17 NOTE — Telephone Encounter (Signed)
Spoke with patient and scheduled her for nurse visit EKG check today at 1120am.

## 2019-01-17 NOTE — Assessment & Plan Note (Signed)
Recurrent PAF Dec 2018-placed on Amiodarone. Pt has baseline bradycardia.  Amiodarone decreased but increased 24 hours ago secondary to increased HR for two weeks.

## 2019-01-17 NOTE — Assessment & Plan Note (Signed)
8871 TTE: Systolic pressure was severely increased. PA  peak pressure: 85 mm Hg

## 2019-01-17 NOTE — Patient Instructions (Addendum)
Medication Instructions:  INCREASE Toprol to 25mg  Take 1 whole tablet for 3 days then go back to half tablet daily CONTINUE Amiodarone If you need a refill on your cardiac medications before your next appointment, please call your pharmacy.   Lab work: None  If you have labs (blood work) drawn today and your tests are completely normal, you will receive your results only by: Marland Kitchen MyChart Message (if you have MyChart) OR . A paper copy in the mail If you have any lab test that is abnormal or we need to change your treatment, we will call you to review the results.  Testing/Procedures: None   Follow-Up: At [facility], you and your health needs are our priority.  As part of our continuing mission to provide you with exceptional heart care, we have created designated Provider Care Teams.  These Care Teams include your primary Cardiologist (physician) and Advanced Practice Providers (APPs -  Physician Assistants and Nurse Practitioners) who all work together to provide you with the care you need, when you need it. FOLLOW UP IS PENDING AFTER LUKE SPEAKS WITH DR [name]  Any Other Special Instructions Will Be Listed Below (If Applicable).

## 2019-01-18 ENCOUNTER — Other Ambulatory Visit (INDEPENDENT_AMBULATORY_CARE_PROVIDER_SITE_OTHER): Payer: PPO

## 2019-01-18 DIAGNOSIS — I495 Sick sinus syndrome: Secondary | ICD-10-CM

## 2019-01-18 NOTE — Progress Notes (Signed)
I agree this ia AFlutter. Can we set her up for cardioversion, please. Needs to "load" on the higher amio dose for a week at least, so sometimes after July 4 would be ideal, if she does not convert. I could do on Thursday July 2 after 10:30 AM. Thermon Leyland

## 2019-01-19 ENCOUNTER — Telehealth: Payer: Self-pay

## 2019-01-19 DIAGNOSIS — I480 Paroxysmal atrial fibrillation: Secondary | ICD-10-CM

## 2019-01-19 NOTE — Telephone Encounter (Signed)
CARDIOVERSION INSTRUCTIONS    Dear [name],  You are scheduled for a Cardioversion.  Cardioversion on July 6th, 2020 with Dr. Dorris Carnes.  Please arrive at the [facility] (Main Entrance A) at [facility]: [address] [place], [place] [address] at 7:45 am. (1 hour prior to procedure unless lab work is needed; if lab work is needed arrive 1.5 hours ahead)  DIET: Nothing to eat or drink after midnight except a sip of water with medications (see medication instructions below)  Medication Instructions: Continue your anticoagulant: Eliquis You will need to continue your anticoagulant after your procedure until you are told by your Provider that it is safe to STOP.   Labs: Come to the lab at [place] [facility] [address] between the hours of 8:00 am and 4:30 pm. You do not have to be fasting.  COVID-19 SCREENING TEST:  YOU COVID TEST IS SCHEDULED FOR 01/25/2019 AT 11:00AM.  You must have a responsible person to drive you home and stay in the waiting area during your procedure. Failure to do so could result in cancellation.  Bring your insurance cards.  *Special Note: Every effort is made to have your procedure done on time. Occasionally there are emergencies that occur at the hospital that may cause delays. Please be patient if a delay does occur.   VISITOR RESTRICTIONS: PATIENT HAS BEEN MADE AWARE NO VISITORS WILL BE ALLOWED TO ENTER IN [place] WITH HER. SHE VOICED UNDERSTANDING.

## 2019-01-19 NOTE — Telephone Encounter (Signed)
-----   Message from Erlene Quan, [place] sent at 01/19/2019 12:39 PM EDT ----- T this patient needs to be set up for OP DCCV with Dr [name] 7/2 (after 10:30)   Kerin Ransom PA-C 01/19/2019 12:42 PM

## 2019-01-19 NOTE — Telephone Encounter (Signed)
Jacqueline [name] and I spoke with patient. Jacqueline [name] explained to patient the procedure and what Dr Croitoru's recommendations were. I spoke with patient and reviewed the directions with her as well as scheduled Covid-19 testing and scheduled a nurse visit for EKG. Patient voiced understanding. Copy of Cardioversion letter mailed to patient along with Lab req.

## 2019-01-25 ENCOUNTER — Other Ambulatory Visit: Payer: Self-pay

## 2019-01-25 ENCOUNTER — Other Ambulatory Visit (HOSPITAL_COMMUNITY): Payer: PPO

## 2019-01-25 ENCOUNTER — Ambulatory Visit (INDEPENDENT_AMBULATORY_CARE_PROVIDER_SITE_OTHER): Payer: PPO

## 2019-01-25 DIAGNOSIS — R531 Weakness: Secondary | ICD-10-CM | POA: Diagnosis not present

## 2019-01-25 DIAGNOSIS — I480 Paroxysmal atrial fibrillation: Secondary | ICD-10-CM

## 2019-01-25 DIAGNOSIS — Z951 Presence of aortocoronary bypass graft: Secondary | ICD-10-CM

## 2019-01-25 LAB — BASIC METABOLIC PANEL
BUN/Creatinine Ratio: 15 (ref 12–28)
BUN: 26 mg/dL (ref 8–27)
CO2: 23 mmol/L (ref 20–29)
Calcium: 9.7 mg/dL (ref 8.7–10.3)
Chloride: 101 mmol/L (ref 96–106)
Creatinine, Ser: 1.77 mg/dL — ABNORMAL HIGH (ref 0.57–1.00)
GFR calc Af Amer: 30 mL/min/{1.73_m2} — ABNORMAL LOW (ref >59)
GFR calc non Af Amer: 26 mL/min/{1.73_m2} — ABNORMAL LOW (ref >59)
Glucose: 104 mg/dL — ABNORMAL HIGH (ref 65–99)
Potassium: 4.7 mmol/L (ref 3.5–5.2)
Sodium: 139 mmol/L (ref 134–144)

## 2019-01-25 LAB — CBC WITH DIFFERENTIAL/PLATELET
Basophils Absolute: 0 10*3/uL (ref 0.0–0.2)
Basos: 0 %
EOS (ABSOLUTE): 0.3 10*3/uL (ref 0.0–0.4)
Eos: 6 %
Hematocrit: 35.6 % (ref 34.0–46.6)
Hemoglobin: 11.6 g/dL (ref 11.1–15.9)
Immature Grans (Abs): 0 10*3/uL (ref 0.0–0.1)
Immature Granulocytes: 0 %
Lymphocytes Absolute: 1.3 10*3/uL (ref 0.7–3.1)
Lymphs: 27 %
MCH: 31.6 pg (ref 26.6–33.0)
MCHC: 32.6 g/dL (ref 31.5–35.7)
MCV: 97 fL (ref 79–97)
Monocytes Absolute: 0.4 10*3/uL (ref 0.1–0.9)
Monocytes: 8 %
Neutrophils Absolute: 2.8 10*3/uL (ref 1.4–7.0)
Neutrophils: 59 %
Platelets: 162 10*3/uL (ref 150–450)
RBC: 3.67 x10E6/uL — ABNORMAL LOW (ref 3.77–5.28)
RDW: 13.2 % (ref 11.7–15.4)
WBC: 4.8 10*3/uL (ref 3.4–10.8)

## 2019-01-25 NOTE — Patient Instructions (Signed)
1.) Reason for visit: EKG  2.) Name of MD requesting visit: Kerin Ransom PA  3.) H&P: Scheduled for cardioversion 01/30/19  4.) ROS related to problem: Atrial Fib  5.) Assessment and plan per MD: DOD Dr.Kelly reviewed EKG which revealed Atrial flutter rate 129.Advised to keep cardioversion appointment as planned.

## 2019-01-26 ENCOUNTER — Other Ambulatory Visit (HOSPITAL_COMMUNITY)
Admission: RE | Admit: 2019-01-26 | Discharge: 2019-01-26 | Disposition: A | Discharge status: Home / Self Care | Payer: PPO | Source: Ambulatory Visit | Attending: Internal Medicine | Admitting: Internal Medicine

## 2019-01-26 DIAGNOSIS — Z1159 Encounter for screening for other viral diseases: Secondary | ICD-10-CM | POA: Diagnosis not present

## 2019-01-26 LAB — SARS CORONAVIRUS 2 (TAT 6-24 HRS): SARS Coronavirus 2: NEGATIVE

## 2019-01-28 ENCOUNTER — Telehealth: Payer: Self-pay | Admitting: Family Medicine

## 2019-01-28 ENCOUNTER — Encounter (HOSPITAL_COMMUNITY): Payer: Self-pay | Admitting: Emergency Medicine

## 2019-01-28 ENCOUNTER — Inpatient Hospital Stay (HOSPITAL_COMMUNITY)
Admission: EM | Admit: 2019-01-28 | Discharge: 2019-01-30 | DRG: 309 | Disposition: A | Discharge status: Home / Self Care | Payer: PPO | Attending: Family Medicine | Admitting: Family Medicine

## 2019-01-28 ENCOUNTER — Other Ambulatory Visit: Payer: Self-pay

## 2019-01-28 DIAGNOSIS — Z8719 Personal history of other diseases of the digestive system: Secondary | ICD-10-CM

## 2019-01-28 DIAGNOSIS — I4892 Unspecified atrial flutter: Secondary | ICD-10-CM | POA: Diagnosis not present

## 2019-01-28 DIAGNOSIS — N1832 Chronic kidney disease, stage 3b: Secondary | ICD-10-CM | POA: Diagnosis present

## 2019-01-28 DIAGNOSIS — R000 Tachycardia, unspecified: Secondary | ICD-10-CM | POA: Diagnosis not present

## 2019-01-28 DIAGNOSIS — K219 Gastro-esophageal reflux disease without esophagitis: Secondary | ICD-10-CM | POA: Diagnosis present

## 2019-01-28 DIAGNOSIS — Z79890 Hormone replacement therapy (postmenopausal): Secondary | ICD-10-CM | POA: Diagnosis not present

## 2019-01-28 DIAGNOSIS — M810 Age-related osteoporosis without current pathological fracture: Secondary | ICD-10-CM | POA: Diagnosis present

## 2019-01-28 DIAGNOSIS — I480 Paroxysmal atrial fibrillation: Secondary | ICD-10-CM | POA: Diagnosis not present

## 2019-01-28 DIAGNOSIS — K625 Hemorrhage of anus and rectum: Secondary | ICD-10-CM | POA: Diagnosis present

## 2019-01-28 DIAGNOSIS — Z951 Presence of aortocoronary bypass graft: Secondary | ICD-10-CM | POA: Diagnosis not present

## 2019-01-28 DIAGNOSIS — I700 Atherosclerosis of aorta: Secondary | ICD-10-CM | POA: Diagnosis present

## 2019-01-28 DIAGNOSIS — K921 Melena: Secondary | ICD-10-CM | POA: Diagnosis present

## 2019-01-28 DIAGNOSIS — D500 Iron deficiency anemia secondary to blood loss (chronic): Secondary | ICD-10-CM | POA: Diagnosis present

## 2019-01-28 DIAGNOSIS — N184 Chronic kidney disease, stage 4 (severe): Secondary | ICD-10-CM | POA: Diagnosis present

## 2019-01-28 DIAGNOSIS — E785 Hyperlipidemia, unspecified: Secondary | ICD-10-CM | POA: Diagnosis present

## 2019-01-28 DIAGNOSIS — F419 Anxiety disorder, unspecified: Secondary | ICD-10-CM | POA: Diagnosis present

## 2019-01-28 DIAGNOSIS — I2510 Atherosclerotic heart disease of native coronary artery without angina pectoris: Secondary | ICD-10-CM | POA: Diagnosis present

## 2019-01-28 DIAGNOSIS — Z8249 Family history of ischemic heart disease and other diseases of the circulatory system: Secondary | ICD-10-CM

## 2019-01-28 DIAGNOSIS — I5032 Chronic diastolic (congestive) heart failure: Secondary | ICD-10-CM | POA: Diagnosis present

## 2019-01-28 DIAGNOSIS — K922 Gastrointestinal hemorrhage, unspecified: Secondary | ICD-10-CM | POA: Diagnosis present

## 2019-01-28 DIAGNOSIS — K640 First degree hemorrhoids: Secondary | ICD-10-CM | POA: Diagnosis present

## 2019-01-28 DIAGNOSIS — Z79891 Long term (current) use of opiate analgesic: Secondary | ICD-10-CM

## 2019-01-28 DIAGNOSIS — I2721 Secondary pulmonary arterial hypertension: Secondary | ICD-10-CM | POA: Diagnosis present

## 2019-01-28 DIAGNOSIS — Z20828 Contact with and (suspected) exposure to other viral communicable diseases: Secondary | ICD-10-CM | POA: Diagnosis present

## 2019-01-28 DIAGNOSIS — Z888 Allergy status to other drugs, medicaments and biological substances status: Secondary | ICD-10-CM

## 2019-01-28 DIAGNOSIS — E039 Hypothyroidism, unspecified: Secondary | ICD-10-CM | POA: Diagnosis present

## 2019-01-28 DIAGNOSIS — G2581 Restless legs syndrome: Secondary | ICD-10-CM | POA: Diagnosis present

## 2019-01-28 DIAGNOSIS — Z7901 Long term (current) use of anticoagulants: Secondary | ICD-10-CM

## 2019-01-28 DIAGNOSIS — I130 Hypertensive heart and chronic kidney disease with heart failure and stage 1 through stage 4 chronic kidney disease, or unspecified chronic kidney disease: Secondary | ICD-10-CM | POA: Diagnosis present

## 2019-01-28 DIAGNOSIS — I441 Atrioventricular block, second degree: Secondary | ICD-10-CM | POA: Diagnosis not present

## 2019-01-28 DIAGNOSIS — Z885 Allergy status to narcotic agent status: Secondary | ICD-10-CM

## 2019-01-28 DIAGNOSIS — Z03818 Encounter for observation for suspected exposure to other biological agents ruled out: Secondary | ICD-10-CM | POA: Diagnosis not present

## 2019-01-28 DIAGNOSIS — I4891 Unspecified atrial fibrillation: Secondary | ICD-10-CM | POA: Diagnosis present

## 2019-01-28 DIAGNOSIS — I340 Nonrheumatic mitral (valve) insufficiency: Secondary | ICD-10-CM | POA: Diagnosis not present

## 2019-01-28 DIAGNOSIS — I4811 Longstanding persistent atrial fibrillation: Secondary | ICD-10-CM | POA: Diagnosis not present

## 2019-01-28 DIAGNOSIS — Z79899 Other long term (current) drug therapy: Secondary | ICD-10-CM | POA: Diagnosis not present

## 2019-01-28 LAB — COMPREHENSIVE METABOLIC PANEL
ALT: 23 U/L (ref 0–44)
AST: 28 U/L (ref 15–41)
Albumin: 3.6 g/dL (ref 3.5–5.0)
Alkaline Phosphatase: 52 U/L (ref 38–126)
Anion gap: 9 (ref 5–15)
BUN: 19 mg/dL (ref 8–23)
CO2: 24 mmol/L (ref 22–32)
Calcium: 9.5 mg/dL (ref 8.9–10.3)
Chloride: 107 mmol/L (ref 98–111)
Creatinine, Ser: 1.8 mg/dL — ABNORMAL HIGH (ref 0.44–1.00)
GFR calc Af Amer: 29 mL/min — ABNORMAL LOW (ref >60)
GFR calc non Af Amer: 25 mL/min — ABNORMAL LOW (ref >60)
Glucose, Bld: 95 mg/dL (ref 70–99)
Potassium: 4.9 mmol/L (ref 3.5–5.1)
Sodium: 140 mmol/L (ref 135–145)
Total Bilirubin: 0.4 mg/dL (ref 0.3–1.2)
Total Protein: 6.2 g/dL — ABNORMAL LOW (ref 6.5–8.1)

## 2019-01-28 LAB — CBC
HCT: 32.2 % — ABNORMAL LOW (ref 36.0–46.0)
Hemoglobin: 10.7 g/dL — ABNORMAL LOW (ref 12.0–15.0)
MCH: 31.9 pg (ref 26.0–34.0)
MCHC: 33.2 g/dL (ref 30.0–36.0)
MCV: 96.1 fL (ref 80.0–100.0)
Platelets: 151 10*3/uL (ref 150–400)
RBC: 3.35 MIL/uL — ABNORMAL LOW (ref 3.87–5.11)
RDW: 14.1 % (ref 11.5–15.5)
WBC: 3.7 10*3/uL — ABNORMAL LOW (ref 4.0–10.5)
nRBC: 0 % (ref 0.0–0.2)

## 2019-01-28 LAB — CBC WITH DIFFERENTIAL/PLATELET
Abs Immature Granulocytes: 0.01 10*3/uL (ref 0.00–0.07)
Basophils Absolute: 0 10*3/uL (ref 0.0–0.1)
Basophils Relative: 1 %
Eosinophils Absolute: 0.2 10*3/uL (ref 0.0–0.5)
Eosinophils Relative: 5 %
HCT: 33.8 % — ABNORMAL LOW (ref 36.0–46.0)
Hemoglobin: 10.9 g/dL — ABNORMAL LOW (ref 12.0–15.0)
Immature Granulocytes: 0 %
Lymphocytes Relative: 29 %
Lymphs Abs: 1.2 10*3/uL (ref 0.7–4.0)
MCH: 31.6 pg (ref 26.0–34.0)
MCHC: 32.2 g/dL (ref 30.0–36.0)
MCV: 98 fL (ref 80.0–100.0)
Monocytes Absolute: 0.4 10*3/uL (ref 0.1–1.0)
Monocytes Relative: 9 %
Neutro Abs: 2.4 10*3/uL (ref 1.7–7.7)
Neutrophils Relative %: 56 %
Platelets: 157 10*3/uL (ref 150–400)
RBC: 3.45 MIL/uL — ABNORMAL LOW (ref 3.87–5.11)
RDW: 14.2 % (ref 11.5–15.5)
WBC: 4.3 10*3/uL (ref 4.0–10.5)
nRBC: 0 % (ref 0.0–0.2)

## 2019-01-28 LAB — SARS CORONAVIRUS 2 BY RT PCR (HOSPITAL ORDER, PERFORMED IN ~~LOC~~ HOSPITAL LAB): SARS Coronavirus 2: NEGATIVE

## 2019-01-28 LAB — TYPE AND SCREEN
ABO/RH(D): O NEG
Antibody Screen: NEGATIVE

## 2019-01-28 LAB — TSH: TSH: 2.375 u[IU]/mL (ref 0.350–4.500)

## 2019-01-28 LAB — PROTIME-INR
INR: 1.2 (ref 0.8–1.2)
Prothrombin Time: 14.8 seconds (ref 11.4–15.2)

## 2019-01-28 MED ORDER — ADULT MULTIVITAMIN W/MINERALS CH
1.0000 | ORAL_TABLET | Freq: Every day | ORAL | Status: DC
  Administered 2019-01-29: 1 via ORAL
  Filled 2019-01-28 (×2): qty 1

## 2019-01-28 MED ORDER — ISOSORBIDE MONONITRATE ER 30 MG PO TB24
30.0000 mg | ORAL_TABLET | Freq: Every evening | ORAL | Status: DC
  Administered 2019-01-28 – 2019-01-29 (×2): 30 mg via ORAL
  Filled 2019-01-28 (×2): qty 1

## 2019-01-28 MED ORDER — AMIODARONE HCL 200 MG PO TABS
200.0000 mg | ORAL_TABLET | Freq: Every day | ORAL | Status: DC
  Administered 2019-01-29 – 2019-01-30 (×2): 200 mg via ORAL
  Filled 2019-01-28 (×2): qty 1

## 2019-01-28 MED ORDER — SODIUM CHLORIDE 0.9% FLUSH
3.0000 mL | Freq: Two times a day (BID) | INTRAVENOUS | Status: DC
  Administered 2019-01-28 (×2): 3 mL via INTRAVENOUS

## 2019-01-28 MED ORDER — SODIUM CHLORIDE 0.9 % IV SOLN
INTRAVENOUS | Status: DC
  Administered 2019-01-28 – 2019-01-30 (×3): via INTRAVENOUS

## 2019-01-28 MED ORDER — SIMVASTATIN 20 MG PO TABS
20.0000 mg | ORAL_TABLET | Freq: Every day | ORAL | Status: DC
  Administered 2019-01-28 – 2019-01-29 (×2): 20 mg via ORAL
  Filled 2019-01-28 (×2): qty 1

## 2019-01-28 MED ORDER — PANTOPRAZOLE SODIUM 40 MG PO TBEC
40.0000 mg | DELAYED_RELEASE_TABLET | Freq: Every day | ORAL | Status: DC
  Administered 2019-01-29: 40 mg via ORAL
  Filled 2019-01-28 (×2): qty 1

## 2019-01-28 MED ORDER — GABAPENTIN 100 MG PO CAPS
100.0000 mg | ORAL_CAPSULE | Freq: Three times a day (TID) | ORAL | Status: DC
  Administered 2019-01-28 – 2019-01-29 (×5): 100 mg via ORAL
  Filled 2019-01-28 (×6): qty 1

## 2019-01-28 MED ORDER — METOPROLOL SUCCINATE ER 25 MG PO TB24
12.5000 mg | ORAL_TABLET | Freq: Every day | ORAL | Status: DC
  Administered 2019-01-29 – 2019-01-30 (×2): 12.5 mg via ORAL
  Filled 2019-01-28 (×2): qty 1

## 2019-01-28 MED ORDER — POLYVINYL ALCOHOL 1.4 % OP SOLN
1.0000 [drp] | Freq: Three times a day (TID) | OPHTHALMIC | Status: DC | PRN
  Filled 2019-01-28: qty 15

## 2019-01-28 MED ORDER — NITROGLYCERIN 0.4 MG SL SUBL: 0.4000 mg | SUBLINGUAL_TABLET | SUBLINGUAL | Status: DC | PRN

## 2019-01-28 MED ORDER — POLYETHYL GLYCOL-PROPYL GLYCOL 0.4-0.3 % OP SOLN: 1.0000 [drp] | Freq: Three times a day (TID) | OPHTHALMIC | Status: DC | PRN

## 2019-01-28 MED ORDER — ROPINIROLE HCL 0.25 MG PO TABS
0.2500 mg | ORAL_TABLET | Freq: Three times a day (TID) | ORAL | Status: DC
  Administered 2019-01-28 – 2019-01-29 (×5): 0.25 mg via ORAL
  Filled 2019-01-28 (×8): qty 1

## 2019-01-28 MED ORDER — LEVOTHYROXINE SODIUM 75 MCG PO TABS
75.0000 ug | ORAL_TABLET | Freq: Every day | ORAL | Status: DC
  Administered 2019-01-29 – 2019-01-30 (×2): 75 ug via ORAL
  Filled 2019-01-28 (×2): qty 1

## 2019-01-28 MED ORDER — OMEGA-3-ACID ETHYL ESTERS 1 G PO CAPS
1.0000 g | ORAL_CAPSULE | Freq: Every day | ORAL | Status: DC
  Administered 2019-01-29: 09:00:00 1 g via ORAL
  Filled 2019-01-28 (×2): qty 1

## 2019-01-28 MED ORDER — ACETAMINOPHEN 500 MG PO TABS
500.0000 mg | ORAL_TABLET | Freq: Four times a day (QID) | ORAL | Status: DC | PRN
  Administered 2019-01-29: 500 mg via ORAL
  Filled 2019-01-28: qty 1

## 2019-01-28 MED ORDER — APIXABAN 2.5 MG PO TABS
2.5000 mg | ORAL_TABLET | Freq: Two times a day (BID) | ORAL | Status: DC
  Filled 2019-01-28: qty 1

## 2019-01-28 MED ORDER — TRAZODONE HCL 100 MG PO TABS
100.0000 mg | ORAL_TABLET | Freq: Every day | ORAL | Status: DC
  Administered 2019-01-28 – 2019-01-29 (×2): 100 mg via ORAL
  Filled 2019-01-28 (×2): qty 1

## 2019-01-28 MED ORDER — MECLIZINE HCL 25 MG PO TABS: 12.5000 mg | ORAL_TABLET | Freq: Three times a day (TID) | ORAL | Status: DC | PRN

## 2019-01-28 NOTE — ED Provider Notes (Signed)
[facility] EMERGENCY DEPARTMENT Provider Note   CSN: [id number] Arrival date & time: 01/28/19  1118    History   Chief Complaint Chief Complaint  Patient presents with  . Atrial Fibrillation  . GI Bleeding    HPI Jacqueline [name] is a 85 y.o. female.     The history is provided by the patient and medical records. No language interpreter was used.  Atrial Fibrillation   Jacqueline [name] is a 85 y.o. female who presents to the Emergency Department complaining of rectal bleeding. She presents to the emergency department from home for evaluation of hematochezia that began last night. She reports greater than 10 episodes of bright red blood per rectum. She states that sometimes it is dripping from her rectum as well. She has associated generalized weakness and a jittery, shaky sensation. She does take Eliquis for history of atrial fibrillation. She has been compliant with her medications and took her morning doses. She is scheduled for a cardioversion on Monday. She lives at home alone. She has experienced similar episodes in the past that she states was related to a polyp. Past Medical History:  Diagnosis Date  . Arthritis    back  . Atrial fibrillation with rapid ventricular response ([place]) 02/2014   a. CHA2DS2VASc = 6 -> on eliquis;  b. 02/2014 s/p DCCV;  c. 08/2014 Echo: EF 55-60%, Gr 2 DD, mild MR, triv AI.  Marland Kitchen CAD (coronary artery disease)    a. s/p CABG x 2 (LIMA->LAD, VG->Diag);  b. 08/2014 Cath: LM nl, LAD 90p, LCX nl, RCA nl/dominant, LIMA->LAD atretic, VG->D2 patent w/ retrograde filling of LAD, EF 55-60%-->Med Rx.  . Diverticulitis 02/21/2012  . GERD (gastroesophageal reflux disease)   . Hyperlipemia   . Hypertension   . Insomnia     Patient Active Problem List   Diagnosis Date Noted  . A-fib ([place]) 01/28/2019  . Vertigo 11/24/2018  . Urinary tract infection 07/14/2018  . Encounter for monitoring amiodarone therapy 07/08/2018  . Coronary artery disease of  autologous bypass graft with stable angina pectoris ([place]) 01/18/2018  . Adjustment reaction with physical symptoms 09/16/2017  . Atherosclerosis of abdominal aorta ([place]) 07/22/2017  . Hypothyroidism 07/22/2017  . Anginal equivalent ([place])   . Long term (current) use of anticoagulants 07/08/2017  . Hereditary and idiopathic peripheral neuropathy 12/12/2015  . Chronic diastolic CHF (congestive heart failure) ([place]) 06/05/2015  . Pulmonary arterial hypertension ([place]) 05/17/2015  . Tachy-brady syndrome ([place]) 05/06/2015  . Hx of CABG 05/06/2015  . Weakness 11/15/2014  . Anemia due to chronic blood loss 10/10/2014  . S/P knee replacement 10/08/2014  . Osteoporosis 03/22/2014  . Paroxysmal atrial fibrillation ([place]) 02/27/2014  . Hypertension   . GERD (gastroesophageal reflux disease)   . CKD (chronic kidney disease) stage 4, GFR 15-29 ml/min ([facility]) 12/22/2013  . Dyslipidemia 09/23/2006  . RHINITIS, ALLERGIC 09/23/2006  . REFLUX ESOPHAGITIS 09/23/2006  . DIVERTICULOSIS OF COLON 09/23/2006  . BACK PAIN, LOW 09/23/2006  . INSOMNIA NOS 09/23/2006    Past Surgical History:  Procedure Laterality Date  . ABDOMINAL HYSTERECTOMY  1975  . CARDIAC CATHETERIZATION N/A 05/09/2015   Procedure: Right Heart Cath;  Surgeon: Larey Dresser, MD;  Location: [facility];  Service: Cardiovascular;  Laterality: N/A;  . CARDIOVERSION N/A 02/28/2014   Procedure: CARDIOVERSION;  Surgeon: Sanda Klein, MD;  Location: [facility];  Service: Cardiovascular;  Laterality: N/A;  . CATARACT EXTRACTION Bilateral 2014   with lens implanted  . COLONOSCOPY  N/A 02/19/2015   Procedure: COLONOSCOPY;  Surgeon: Ladene Artist, MD;  Location: [facility] ENDOSCOPY;  Service: Endoscopy;  Laterality: N/A;  . CORONARY ARTERY BYPASS GRAFT  2006   off-pump bypass surgery with LIMA to the LAD and SVG to the second diagonal artery ( performed by Dr Servando Snare)   . JOINT REPLACEMENT Bilateral    L-2004, R-2006  . LEFT HEART  CATHETERIZATION WITH CORONARY/GRAFT ANGIOGRAM N/A 09/17/2014   Procedure: LEFT HEART CATHETERIZATION WITH Beatrix Fetters;  Surgeon: Troy Sine, MD;  Location: [facility] CATH LAB;  Service: Cardiovascular;  Laterality: N/A;  . REIMPLANTATION OF TOTAL KNEE Right 10/08/2014   Procedure: REIMPLANTATION OF RIGHT TOTAL KNEE ARTHROPLASTY WITH REMOVAL OF ANTIBIOTIC SPACER;  Surgeon: Paralee Cancel, MD;  Location: [facility];  Service: Orthopedics;  Laterality: Right;  . ROTATOR CUFF REPAIR Bilateral    r-1999, l- 2005  . TEE WITHOUT CARDIOVERSION N/A 02/28/2014   Procedure: TRANSESOPHAGEAL ECHOCARDIOGRAM (TEE);  Surgeon: Sanda Klein, MD;  Location: [facility];  Service: Cardiovascular;  Laterality: N/A;  . TOTAL KNEE ARTHROPLASTY Right 07/02/2014   Procedure: Resection of Infected Right Total Knee Arthroplasty with placement antibiotic spacer;  Surgeon: Mauri Pole, MD;  Location: [facility];  Service: Orthopedics;  Laterality: Right;     OB History   No obstetric history on file.      Home Medications    Prior to Admission medications   Medication Sig Start Date End Date Taking? Authorizing Provider  acetaminophen (TYLENOL) 500 MG tablet Take 500 mg by mouth every 6 (six) hours as needed for moderate pain (pain/headache.).    Yes [provider]  ALPRAZolam (XANAX) 0.5 MG tablet TAKE 1 TABLET BY MOUTH TWICE A DAY AS NEEDED FOR ANXIETY Patient taking differently: Take 0.5 mg by mouth 2 (two) times daily as needed (anxiety).  12/10/18  Yes Hensel, Jamal Collin, MD  amiodarone (PACERONE) 100 MG tablet Take 2 tablets ( 200 mg ) daily Patient taking differently: Take 200 mg by mouth daily.  01/16/19  Yes Troy Sine, MD  apixaban (ELIQUIS) 2.5 MG TABS tablet Take 1 tablet (2.5 mg total) by mouth 2 (two) times daily. 01/03/19  Yes Croitoru, Mihai, MD  gabapentin (NEURONTIN) 100 MG capsule TAKE 1 CAPSULE (100 MG TOTAL) BY MOUTH 3 (THREE) TIMES DAILY. 04/15/18  Yes Hensel, Jamal Collin, MD   Iron-Vitamins (GERITOL COMPLETE PO) Take 1 tablet by mouth daily.   Yes [provider]  isosorbide mononitrate (IMDUR) 30 MG 24 hr tablet Take 1 tablet (30 mg total) by mouth daily. Patient taking differently: Take 30 mg by mouth every evening.  10/20/18  Yes Croitoru, Mihai, MD  levothyroxine (SYNTHROID, LEVOTHROID) 75 MCG tablet Take 1 tablet (75 mcg total) by mouth daily. 09/15/18  Yes Hensel, Jamal Collin, MD  meclizine (ANTIVERT) 12.5 MG tablet Take 1 tablet (12.5 mg total) by mouth 3 (three) times daily as needed for dizziness. 11/24/18  Yes Hensel, Jamal Collin, MD  metoprolol succinate (TOPROL-XL) 25 MG 24 hr tablet Take 0.5 tablets (12.5 mg total) by mouth daily. 10/26/18 01/28/19 Yes Croitoru, Mihai, MD  nitroGLYCERIN (NITROSTAT) 0.4 MG SL tablet PLACE 1 TABLET UNDER THE TONGUE EVERY 5 MINUTES AS NEEDED FOR CHEST PAIN UP TO 3 DOSES Patient taking differently: Place 0.4 mg under the tongue every 5 (five) minutes x 3 doses as needed for chest pain. PLACE 1 TABLET UNDER THE TONGUE EVERY 5 MINUTES AS NEEDED FOR CHEST PAIN UP TO 3 DOSES 10/20/18  Yes Croitoru, Mihai,  MD  Omega-3 Fatty Acids (FISH OIL) 1000 MG CAPS Take 1,000 mg by mouth 2 (two) times a day.    Yes [provider]  omeprazole (PRILOSEC) 20 MG capsule TAKE ONE CAPSULE BY MOUTH EVERY DAY Patient taking differently: Take 20 mg by mouth daily.  01/04/19  Yes Hensel, Jamal Collin, MD  Polyethyl Glycol-Propyl Glycol (LUBRICANT EYE DROPS) 0.4-0.3 % SOLN Place 1-2 drops into both eyes 3 (three) times daily as needed (dry/irritated eyes.).   Yes [provider]  polyethylene glycol powder (GLYCOLAX/MIRALAX) powder MIX 17 G AND TAKE BY MOUTH DAILY. Patient taking differently: Take 17 g by mouth 3 (three) times a week.  05/23/17  Yes Hensel, Jamal Collin, MD  rOPINIRole (REQUIP) 0.25 MG tablet TAKE 1 TABLET BY MOUTH 3 TIMES A DAY Patient taking differently: Take 0.25 mg by mouth 3 (three) times daily.  07/18/18  Yes Hensel, Jamal Collin, MD  simvastatin (ZOCOR) 20 MG tablet TAKE 1 TABLET BY MOUTH EVERYDAY AT BEDTIME Patient taking differently: Take 20 mg by mouth at bedtime.  10/10/18  Yes Hensel, Jamal Collin, MD  traMADol (ULTRAM) 50 MG tablet TAKE 1 TABLET BY MOUTH TWICE A DAY AS NEEDED FOR PAIN Patient taking differently: Take 50 mg by mouth every 12 (twelve) hours as needed (pain).  07/14/18  Yes [name], MD  traZODone (DESYREL) 100 MG tablet TAKE 1 TABLET (100 MG TOTAL) BY MOUTH AT BEDTIME. 10/20/18  Yes Hensel, Jamal Collin, MD    Family History Family History  Problem Relation Age of Onset  . Parkinson's disease Mother   . Heart attack Brother   . Arthritis Brother   . Cancer Brother        Unknown  . Breast cancer Neg Hx     Social History Social History   Tobacco Use  . Smoking status: Never Smoker  . Smokeless tobacco: Never Used  Substance Use Topics  . Alcohol use: No  . Drug use: No     Allergies   Atorvastatin, Crestor [rosuvastatin], Morphine and related, and Prednisone   Review of Systems Review of Systems  All other systems reviewed and are negative.    Physical Exam Updated Vital Signs BP (!) 125/51   Pulse (!) 111   Temp 98.3 F (36.8 C) (Oral)   Resp 18   Ht 5\' 1"  (1.549 m)   Wt 59.9 kg   SpO2 98%   BMI 24.94 kg/m   Physical Exam Vitals signs and nursing note reviewed.  Constitutional:      Appearance: She is well-developed.  HENT:     Head: Normocephalic and atraumatic.  Cardiovascular:     Rate and Rhythm: Tachycardia present. Rhythm irregular.     Heart sounds: No murmur.  Pulmonary:     Effort: Pulmonary effort is normal. No respiratory distress.     Breath sounds: Normal breath sounds.  Abdominal:     Palpations: Abdomen is soft.     Tenderness: There is no abdominal tenderness. There is no guarding or rebound.  Genitourinary:    Comments: Nontender external hemorrhoids. No gross blood on rectal examination but there is residual blood in her  undergarments. Musculoskeletal:        General: No swelling or tenderness.  Skin:    General: Skin is warm and dry.     Capillary Refill: Capillary refill takes less than 2 seconds.  Neurological:     Mental Status: She is alert and oriented to person, place, and time.  Psychiatric:        Mood and Affect: Mood normal.        Behavior: Behavior normal.      ED Treatments / Results  Labs (all labs ordered are listed, but only abnormal results are displayed) Labs Reviewed  COMPREHENSIVE METABOLIC PANEL - Abnormal; Notable for the following components:      Result Value   Creatinine, Ser 1.80 (*)    Total Protein 6.2 (*)    GFR calc non Af Amer 25 (*)    GFR calc Af Amer 29 (*)    All other components within normal limits  CBC WITH DIFFERENTIAL/PLATELET - Abnormal; Notable for the following components:   RBC 3.45 (*)    Hemoglobin 10.9 (*)    HCT 33.8 (*)    All other components within normal limits  SARS CORONAVIRUS 2 (HOSPITAL ORDER, [facility])  PROTIME-INR  TSH  CBC  TYPE AND SCREEN    EKG EKG Interpretation  Date/Time:  Saturday January 28 2019 11:30:47 EDT Ventricular Rate:  119 PR Interval:    QRS Duration: 87 QT Interval:  326 QTC Calculation: 459 R Axis:   50 Text Interpretation:  Atrial flutter with predominant 2:1 AV block Confirmed by Quintella Reichert [phone]) on 01/28/2019 12:13:40 PM   Radiology No results found.  Procedures Procedures (including critical care time)  Medications Ordered in ED Medications  acetaminophen (TYLENOL) tablet 500 mg (has no administration in time range)  amiodarone (PACERONE) tablet 200 mg (has no administration in time range)  isosorbide mononitrate (IMDUR) 24 hr tablet 30 mg (has no administration in time range)  metoprolol succinate (TOPROL-XL) 24 hr tablet 12.5 mg (has no administration in time range)  nitroGLYCERIN (NITROSTAT) SL tablet 0.4 mg (has no administration in time range)   simvastatin (ZOCOR) tablet 20 mg (has no administration in time range)  traZODone (DESYREL) tablet 100 mg (has no administration in time range)  levothyroxine (SYNTHROID) tablet 75 mcg (has no administration in time range)  meclizine (ANTIVERT) tablet 12.5 mg (has no administration in time range)  pantoprazole (PROTONIX) EC tablet 40 mg (has no administration in time range)  gabapentin (NEURONTIN) capsule 100 mg (has no administration in time range)  rOPINIRole (REQUIP) tablet 0.25 mg (has no administration in time range)  multivitamin with minerals tablet 1 tablet (has no administration in time range)  omega-3 acid ethyl esters (LOVAZA) capsule 1 g (has no administration in time range)  Polyethyl Glycol-Propyl Glycol 0.4-0.3 % SOLN 1-2 drop (has no administration in time range)  sodium chloride flush (NS) 0.9 % injection 3 mL (has no administration in time range)  0.9 %  sodium chloride infusion (has no administration in time range)     Initial Impression / Assessment and Plan / ED Course  I have reviewed the triage vital signs and the nursing notes.  Pertinent labs & imaging results that were available during my care of the patient were reviewed by me and considered in my medical decision making (see chart for details).        Patient with history of atrial fibrillation on eliquis here for evaluation of hematochezia. She has no gross blood on rectal examination but does have evidence of recent bleeding on her undergarments. She is in atrial fibrillation on ED arrival, mildly tachycardic. Tachycardia is unclear if it's related to recent bleeding or if it is due to her underlying atrial fibrillation. Labs demonstrate mild worsening in her chronic anemia. Given her anticoagulation,  tachycardia and anemia plan to admit for observation. Discussed with patient findings of studies recommendation for observation and she is in agreement with plan. Family medicine service consulted for admission.   Final Clinical Impressions(s) / ED Diagnoses   Final diagnoses:  Lower GI bleed    ED Discharge Orders    None       Quintella Reichert, MD 01/28/19 1537

## 2019-01-28 NOTE — ED Triage Notes (Signed)
Pt reports being in A-fib for 15 days and scheduled for cardioversion on Monday. Pt reports starting to have bright red rectal bleeding yesterday. Pt on blood thinners, endorses weakness.

## 2019-01-28 NOTE — Plan of Care (Signed)
  Problem: Education: Goal: Ability to identify signs and symptoms of gastrointestinal bleeding will improve Outcome: Progressing   Problem: Bowel/Gastric: Goal: Will show no signs and symptoms of gastrointestinal bleeding Outcome: Progressing   Problem: Fluid Volume: Goal: Will show no signs and symptoms of excessive bleeding Outcome: Progressing   Problem: Clinical Measurements: Goal: Complications related to the disease process, condition or treatment will be avoided or minimized Outcome: Progressing   Problem: Education: Goal: Knowledge of disease or condition will improve Outcome: Progressing Goal: Understanding of medication regimen will improve Outcome: Progressing Goal: Individualized Educational Video(s) Outcome: Progressing   Problem: Activity: Goal: Ability to tolerate increased activity will improve Outcome: Progressing   Problem: Cardiac: Goal: Ability to achieve and maintain adequate cardiopulmonary perfusion will improve Outcome: Progressing   Problem: Health Behavior/Discharge Planning: Goal: Ability to safely manage health-related needs after discharge will improve Outcome: Progressing   Problem: Education: Goal: Knowledge of General Education information will improve Description: Including pain rating scale, medication(s)/side effects and non-pharmacologic comfort measures Outcome: Progressing   Problem: Health Behavior/Discharge Planning: Goal: Ability to manage health-related needs will improve Outcome: Progressing   Problem: Clinical Measurements: Goal: Ability to maintain clinical measurements within normal limits will improve Outcome: Progressing Goal: Will remain free from infection Outcome: Progressing Goal: Diagnostic test results will improve Outcome: Progressing Goal: Respiratory complications will improve Outcome: Progressing Goal: Cardiovascular complication will be avoided Outcome: Progressing   Problem: Activity: Goal: Risk for  activity intolerance will decrease Outcome: Progressing   Problem: Nutrition: Goal: Adequate nutrition will be maintained Outcome: Progressing   Problem: Coping: Goal: Level of anxiety will decrease Outcome: Progressing   Problem: Elimination: Goal: Will not experience complications related to bowel motility Outcome: Progressing Goal: Will not experience complications related to urinary retention Outcome: Progressing   Problem: Pain Managment: Goal: General experience of comfort will improve Outcome: Progressing   Problem: Safety: Goal: Ability to remain free from injury will improve Outcome: Progressing   Problem: Skin Integrity: Goal: Risk for impaired skin integrity will decrease Outcome: Progressing

## 2019-01-28 NOTE — ED Notes (Signed)
ED TO INPATIENT HANDOFF REPORT  ED Nurse Name and Phone #: Benjamine Mola [phone]  S Name/Age/Gender Jacqueline [name] 85 y.o. female Room/Bed: 011C/011C  Code Status   Code Status: Full Code  Home/SNF/Other Home Patient oriented to: self, place, time and situation Is this baseline? Yes   Triage Complete: Triage complete  Chief Complaint AFIB; GI Bleed  Triage Note Pt reports being in A-fib for 15 days and scheduled for cardioversion on Monday. Pt reports starting to have bright red rectal bleeding yesterday. Pt on blood thinners, endorses weakness.    Allergies Allergies  Allergen Reactions  . Atorvastatin Other (See Comments)    Myalgias in legs  . Crestor [Rosuvastatin] Other (See Comments)    Myalgias in legs  . Morphine And Related Other (See Comments)    "Crazy thoughts, severe headache, sick"  . Prednisone     On two occasions has caused A fib    Level of Care/Admitting Diagnosis ED Disposition    ED Disposition Condition [facility]: [place] [[id number]]  Level of Care: Telemetry Cardiac [103]  Covid Evaluation: Asymptomatic Screening Protocol (No Symptoms)  Diagnosis: A-fib [facility]) [[id number]]  Admitting Physician: Leanor Rubenstein  Attending Physician: MCDIARMID, TODD D [[id number]]  PT Class (Do Not Modify): Observation [104]  PT Acc Code (Do Not Modify): Observation [10022]       B Medical/Surgery History Past Medical History:  Diagnosis Date  . Arthritis    back  . Atrial fibrillation with rapid ventricular response ([place]) 02/2014   a. CHA2DS2VASc = 6 -> on eliquis;  b. 02/2014 s/p DCCV;  c. 08/2014 Echo: EF 55-60%, Gr 2 DD, mild MR, triv AI.  Marland Kitchen CAD (coronary artery disease)    a. s/p CABG x 2 (LIMA->LAD, VG->Diag);  b. 08/2014 Cath: LM nl, LAD 90p, LCX nl, RCA nl/dominant, LIMA->LAD atretic, VG->D2 patent w/ retrograde filling of LAD, EF 55-60%-->Med Rx.  . Diverticulitis 02/21/2012  . GERD (gastroesophageal reflux  disease)   . Hyperlipemia   . Hypertension   . Insomnia    Past Surgical History:  Procedure Laterality Date  . ABDOMINAL HYSTERECTOMY  1975  . CARDIAC CATHETERIZATION N/A 05/09/2015   Procedure: Right Heart Cath;  Surgeon: Larey Dresser, MD;  Location: [place] CV LAB;  Service: Cardiovascular;  Laterality: N/A;  . CARDIOVERSION N/A 02/28/2014   Procedure: CARDIOVERSION;  Surgeon: Sanda Klein, MD;  Location: [place] ENDOSCOPY;  Service: Cardiovascular;  Laterality: N/A;  . CATARACT EXTRACTION Bilateral 2014   with lens implanted  . COLONOSCOPY N/A 02/19/2015   Procedure: COLONOSCOPY;  Surgeon: Ladene Artist, MD;  Location: [facility] ENDOSCOPY;  Service: Endoscopy;  Laterality: N/A;  . CORONARY ARTERY BYPASS GRAFT  2006   off-pump bypass surgery with LIMA to the LAD and SVG to the second diagonal artery ( performed by Dr Servando Snare)   . JOINT REPLACEMENT Bilateral    L-2004, R-2006  . LEFT HEART CATHETERIZATION WITH CORONARY/GRAFT ANGIOGRAM N/A 09/17/2014   Procedure: LEFT HEART CATHETERIZATION WITH Beatrix Fetters;  Surgeon: [place] Sine, MD;  Location: [facility] CATH LAB;  Service: Cardiovascular;  Laterality: N/A;  . REIMPLANTATION OF TOTAL KNEE Right 10/08/2014   Procedure: REIMPLANTATION OF RIGHT TOTAL KNEE ARTHROPLASTY WITH REMOVAL OF ANTIBIOTIC SPACER;  Surgeon: Paralee Cancel, MD;  Location: [facility] ORS;  Service: Orthopedics;  Laterality: Right;  . ROTATOR CUFF REPAIR Bilateral    r-1999, l- 2005  . TEE WITHOUT CARDIOVERSION N/A 02/28/2014   Procedure: TRANSESOPHAGEAL ECHOCARDIOGRAM (  TEE);  Surgeon: Sanda Klein, MD;  Location: [place];  Service: Cardiovascular;  Laterality: N/A;  . TOTAL KNEE ARTHROPLASTY Right 07/02/2014   Procedure: Resection of Infected Right Total Knee Arthroplasty with placement antibiotic spacer;  Surgeon: Mauri Pole, MD;  Location: [facility] ORS;  Service: Orthopedics;  Laterality: Right;     A IV Location/Drains/Wounds Patient Lines/Drains/Airways Status    Active Line/Drains/Airways    Name:   Placement date:   Placement time:   Site:   Days:   Peripheral IV 01/28/19 Right Antecubital   01/28/19    1147    Antecubital   less than 1          Intake/Output Last 24 hours No intake or output data in the 24 hours ending 01/28/19 1519  Labs/Imaging Results for orders placed or performed during the hospital encounter of 01/28/19 (from the past 48 hour(s))  Comprehensive metabolic panel     Status: Abnormal   Collection Time: 01/28/19 11:42 AM  Result Value Ref Range   Sodium 140 135 - 145 mmol/L   Potassium 4.9 3.5 - 5.1 mmol/L   Chloride 107 98 - 111 mmol/L   CO2 24 22 - 32 mmol/L   Glucose, Bld 95 70 - 99 mg/dL   BUN 19 8 - 23 mg/dL   Creatinine, Ser 1.80 (H) 0.44 - 1.00 mg/dL   Calcium 9.5 8.9 - 10.3 mg/dL   Total Protein 6.2 (L) 6.5 - 8.1 g/dL   Albumin 3.6 3.5 - 5.0 g/dL   AST 28 15 - 41 U/L   ALT 23 0 - 44 U/L   Alkaline Phosphatase 52 38 - 126 U/L   Total Bilirubin 0.4 0.3 - 1.2 mg/dL   GFR calc non Af Amer 25 (L) >60 mL/min   GFR calc Af Amer 29 (L) >60 mL/min   Anion gap 9 5 - 15    Comment: Performed at [facility], [address], [place], [place] [address]  CBC with Differential     Status: Abnormal   Collection Time: 01/28/19 11:42 AM  Result Value Ref Range   WBC 4.3 4.0 - 10.5 K/uL   RBC 3.45 (L) 3.87 - 5.11 MIL/uL   Hemoglobin 10.9 (L) 12.0 - 15.0 g/dL   HCT 33.8 (L) 36.0 - 46.0 %   MCV 98.0 80.0 - 100.0 fL   MCH 31.6 26.0 - 34.0 pg   MCHC 32.2 30.0 - 36.0 g/dL   RDW 14.2 11.5 - 15.5 %   Platelets 157 150 - 400 K/uL   nRBC 0.0 0.0 - 0.2 %   Neutrophils Relative % 56 %   Neutro Abs 2.4 1.7 - 7.7 K/uL   Lymphocytes Relative 29 %   Lymphs Abs 1.2 0.7 - 4.0 K/uL   Monocytes Relative 9 %   Monocytes Absolute 0.4 0.1 - 1.0 K/uL   Eosinophils Relative 5 %   Eosinophils Absolute 0.2 0.0 - 0.5 K/uL   Basophils Relative 1 %   Basophils Absolute 0.0 0.0 - 0.1 K/uL   Immature Granulocytes 0 %   Abs  Immature Granulocytes 0.01 0.00 - 0.07 K/uL    Comment: Performed at [facility] [address] [place], [place] [address]  Protime-INR     Status: None   Collection Time: 01/28/19 11:42 AM  Result Value Ref Range   Prothrombin Time 14.8 11.4 - 15.2 seconds   INR 1.2 0.8 - 1.2    Comment: (NOTE) INR goal varies based  on device and disease states. Performed at [facility], [place] [address] [place], [place] [address]   Type and screen [place]     Status: None   Collection Time: 01/28/19 11:42 AM  Result Value Ref Range   ABO/RH(D) O NEG    Antibody Screen NEG    Sample Expiration      01/31/2019,2359 Performed at [facility], [place] [address] [place], [place] [address]   SARS Coronavirus 2 (CEPHEID - Performed in [place] hospital lab), Hosp Order     Status: None   Collection Time: 01/28/19  1:16 PM   Specimen: Nasopharyngeal Swab  Result Value Ref Range   SARS Coronavirus 2 NEGATIVE NEGATIVE    Comment: (NOTE) If result is NEGATIVE SARS-CoV-2 target nucleic acids are NOT DETECTED. The SARS-CoV-2 RNA is generally detectable in upper and lower  respiratory specimens during the acute phase of infection. The lowest  concentration of SARS-CoV-2 viral copies this assay can detect is 250  copies / mL. A negative result does not preclude SARS-CoV-2 infection  and should not be used as the sole basis for treatment or other  patient management decisions.  A negative result may occur with  improper specimen collection / handling, submission of specimen other  than nasopharyngeal swab, presence of viral mutation(s) within the  areas targeted by this assay, and inadequate number of viral copies  (<250 copies / mL). A negative result must be combined with clinical  observations, patient history, and epidemiological information. If result is POSITIVE SARS-CoV-2 target nucleic acids are DETECTED. The SARS-CoV-2 RNA is generally detectable in  upper and lower  respiratory specimens dur ing the acute phase of infection.  Positive  results are indicative of active infection with SARS-CoV-2.  Clinical  correlation with patient history and other diagnostic information is  necessary to determine patient infection status.  Positive results do  not rule out bacterial infection or co-infection with other viruses. If result is PRESUMPTIVE POSTIVE SARS-CoV-2 nucleic acids MAY BE PRESENT.   A presumptive positive result was obtained on the submitted specimen  and confirmed on repeat testing.  While 2019 novel coronavirus  (SARS-CoV-2) nucleic acids may be present in the submitted sample  additional confirmatory testing may be necessary for epidemiological  and / or clinical management purposes  to differentiate between  SARS-CoV-2 and other Sarbecovirus currently known to infect humans.  If clinically indicated additional testing with an alternate test  methodology [phone]) is advised. The SARS-CoV-2 RNA is generally  detectable in upper and lower respiratory sp ecimens during the acute  phase of infection. The expected result is Negative. Fact Sheet for Patients:  StrictlyIdeas.no Fact Sheet for Healthcare Providers: BankingDealers.co.za This test is not yet approved or cleared by the [place] FDA and has been authorized for detection and/or diagnosis of SARS-CoV-2 by FDA under an Emergency Use Authorization (EUA).  This EUA will remain in effect (meaning this test can be used) for the duration of the COVID-19 declaration under Section 564(b)(1) of the Act, 21 U.S.C. section 360bbb-3(b)(1), unless the authorization is terminated or revoked sooner. Performed at [facility], [place] [address], [place], [place] [address]    No results found.  Pending Labs Unresulted Labs (From admission, onward)    Start     Ordered   01/29/19 2229  Basic metabolic panel  Daily,   R      01/28/19 1505   01/29/19 0500  CBC  Daily,   R     01/28/19 1505   01/28/19 2000  CBC  Once,   STAT    Comments: At 8pm    01/28/19 1503   01/28/19 1451  TSH  Once,   STAT     01/28/19 1458          Vitals/Pain Today's Vitals   01/28/19 1330 01/28/19 1345 01/28/19 1400 01/28/19 1415  BP: (!) 144/102 (!) 151/110 (!) 170/109 (!) 125/51  Pulse: 76 (!) 117 (!) 117 (!) 111  Resp:      Temp:      TempSrc:      SpO2: 96% 97% 94% 98%  Weight:      Height:      PainSc:        Isolation Precautions No active isolations  Medications Medications  acetaminophen (TYLENOL) tablet 500 mg (has no administration in time range)  amiodarone (PACERONE) tablet 200 mg (has no administration in time range)  isosorbide mononitrate (IMDUR) 24 hr tablet 30 mg (has no administration in time range)  metoprolol succinate (TOPROL-XL) 24 hr tablet 12.5 mg (has no administration in time range)  nitroGLYCERIN (NITROSTAT) SL tablet 0.4 mg (has no administration in time range)  simvastatin (ZOCOR) tablet 20 mg (has no administration in time range)  traZODone (DESYREL) tablet 100 mg (has no administration in time range)  levothyroxine (SYNTHROID) tablet 75 mcg (has no administration in time range)  meclizine (ANTIVERT) tablet 12.5 mg (has no administration in time range)  pantoprazole (PROTONIX) EC tablet 40 mg (has no administration in time range)  gabapentin (NEURONTIN) capsule 100 mg (has no administration in time range)  rOPINIRole (REQUIP) tablet 0.25 mg (has no administration in time range)  multivitamin with minerals tablet 1 tablet (has no administration in time range)  omega-3 acid ethyl esters (LOVAZA) capsule 1 g (has no administration in time range)  Polyethyl Glycol-Propyl Glycol 0.4-0.3 % SOLN 1-2 drop (has no administration in time range)  sodium chloride flush (NS) 0.9 % injection 3 mL (has no administration in time range)  0.9 %  sodium chloride infusion (has no administration in time  range)    Mobility walks with person assist Moderate fall risk   Focused Assessments Cardiac Assessment Handoff:    Lab Results  Component Value Date   TROPONINI <0.03 09/12/2017   No results found for: DDIMER Does the Patient currently have chest pain? No     R Recommendations: See Admitting Provider Note  Report given to:   Additional Notes:

## 2019-01-28 NOTE — Telephone Encounter (Signed)
Received page the after-hours emergency pager from the family medicine clinic.  Patient noted that she has started bleeding from her rectum again today.  She said this is a recurring problem for him in the past.  Her major concern is that she is now on Eliquis and scheduled for a cardioversion for A. fib on Monday, July 6.  She is nervous that this will interfere with that.  I asked her for the amount of bleeding if she is having any pain.  She said beyond pain generally weak which she attributes to her A. fib she says she feels fine.  She says it is much more than just a couple streaks on the toilet paper and that it is an amount of blood that she does find this "a lot ".  She is not getting lightheaded she denies any injury.  We discussed that given her evident bleed with a description of "a lot ", while on anticoagulation, description of being fatigued and already having an abnormal heart rhythm that there is no way I can tell her it is safe to stay home until Monday when she comes for her procedure.  My firm advice to her is to come to the emergency department was called immediately.  We did not promise any particular result or timing of her eventual cardioversion, the main goal is to assess her bleeding status and make sure that she is safe hemodynamically.  She states that she has a ride to bring her immediately.  Dr. Criss Rosales

## 2019-01-28 NOTE — ED Notes (Signed)
Small amount of bright red blood noted when pt finished using restroom on paper towel she had wiped with.

## 2019-01-28 NOTE — H&P (Addendum)
[facility] Admission History and Physical Service Pager: [phone]  Patient name: Jacqueline [name] Medical record number: [id number] Date of birth: 12/06/1930 Age: 85 y.o. Gender: female  Primary Care Provider: [name], MD Consultants:  Code Status: Full Contact: sons (duane/darryl)  Chief Complaint: feeling dizzy rectal bleeding  Assessment and Plan: Jacqueline [name] is a 85 y.o. female presenting with weakness and dizziness and rectal bleeding . PMH is significant for uncontrolled A.Fib, on Eliquis, CAD, Diverticulosis, HTN,, HLD, GERD, Restless leg syndrome  Afib RVR, potential for contribution by anemia Current HR  99- 140's and irregular, does have history of tachybradycardia syndrome.  No chest pain, dizziness.  At home takes metoprolol XL 12.5mg  daily, with recent change to amiodarone 200 mg daily for rate control.  Pt was scheduled for cardioversion on July 6 by cardiology prior to admission. Hbg 10.9 today, decreased from 11.6  (07/03) -Admit to telemetry, Dr. McDiarmid -Continue home cardiac meds -We will talk to cardiology about potentially canceling scheduled conversion if we have to stop anticoagulation -Repeat Hbg at 2000 and consider restarting Eliquis  -Vitals every 4  Report of bleeding per rectum: Pt complains of rectal bleeding.  She was sitting and felt blood coming out of her rectal area.  States large amount of blood in toilet and dripping on floorm when standing.  She has no bleeding currently.  DRE negative for blood.  Small amount spotted dried blood noited on sanitary pad. No obvious bleeding noted from rectum or vaginal area.  Pt states that she is sure the bleeding did not come from vaginal area as she had Abdominal Hysterectomy (1975).  Blood pressure strong, patient does claim some fatigue but says this is been consistent for the last few weeks since her A. fib started.  Colonoscopy from 2018 did show grade 1 internal  hemorrhoids and mild diverticulosis.  Patient does not claim any belly pain. -Repeat CBC at 8 PM -Hold Eliquis pending evaluation this evening after p.m. CBC -We will decide at that point is appropriate to continue Eliquis, last dose a.m. 7/4 Plan to admit for observation of any further rectal bleeding. Monitor Hbg overnight.  CAD No complaints of chest pain Will continue home meds  - Imdur 30 Mg - Metoprolol XL 12.5 mg  - Nitroglycerine 0.4 mg SL prn - Simvastatin 20 mg   GERD Continue home dose Protonix EC 40 mg daily  Restless Leg  Continue Ropinirole 0.25 mg TID  FEN/GI: NPO overnight Prophylaxis: Hold overnight  Disposition: pt lives alone but states can have care at home if needed on discharge  History of Present Illness:  Jacqueline [name] is a 85 y.o. female presenting with weakness and dizziness which she says is been present for the last few weeks and she went to the A. fib.  She was watching television and felt blood coming from her rectum.  She states that when she went to the bathroom there was a large amount of fresh red blood in the toilet and was dripping on the floor when standing.  She had no LOC at that time.  She claims continued bleeding today and was able to call the emergency line and was advised to seek medical assistance.  She is currently taking Eliquis for A.Fib, and did take her a.m. dose for today.  She was scheduled by cardiology for cardioversion Monday 01/30/2019.  She denies any headaches, LOC, chest pain, SOB and any other sites of bleeding.  She lives  alone but has close relationship with 2 sons live in the area  In the ED pt was noted to have heart rate ranging from mid 90s to 130s, Hbg 10.9. No BPR noted per ED doc or to our exam.  Review Of Systems: Per HPI with the following additions:  Review of Systems  Constitutional: Positive for malaise/fatigue. Negative for chills, diaphoresis, fever and weight loss.  HENT: Negative for ear discharge, ear  pain, hearing loss, nosebleeds, sinus pain and tinnitus.   Eyes: Negative for blurred vision.  Respiratory: Negative for cough and hemoptysis.   Cardiovascular: Negative for chest pain, palpitations and leg swelling.  Gastrointestinal: Positive for abdominal pain and blood in stool. Negative for constipation and diarrhea.  Genitourinary: Negative for hematuria.  Musculoskeletal: Negative for falls.  Skin: Negative for itching and rash.  Neurological: Positive for dizziness. Negative for tingling, seizures, loss of consciousness, weakness and headaches.  Endo/Heme/Allergies: Does not bruise/bleed easily.  Psychiatric/Behavioral: Negative for depression. The patient is not nervous/anxious.     Patient Active Problem List   Diagnosis Date Noted  . Vertigo 11/24/2018  . Urinary tract infection 07/14/2018  . Encounter for monitoring amiodarone therapy 07/08/2018  . Coronary artery disease of autologous bypass graft with stable angina pectoris ([place]) 01/18/2018  . Adjustment reaction with physical symptoms 09/16/2017  . Atherosclerosis of abdominal aorta ([place]) 07/22/2017  . Hypothyroidism 07/22/2017  . Anginal equivalent ([place])   . Long term (current) use of anticoagulants 07/08/2017  . Hereditary and idiopathic peripheral neuropathy 12/12/2015  . Chronic diastolic CHF (congestive heart failure) ([place]) 06/05/2015  . Pulmonary arterial hypertension ([place]) 05/17/2015  . Tachy-brady syndrome ([place]) 05/06/2015  . Hx of CABG 05/06/2015  . Weakness 11/15/2014  . Anemia due to chronic blood loss 10/10/2014  . S/P knee replacement 10/08/2014  . Osteoporosis 03/22/2014  . Paroxysmal atrial fibrillation ([place]) 02/27/2014  . Hypertension   . GERD (gastroesophageal reflux disease)   . CKD (chronic kidney disease) stage 4, GFR 15-29 ml/min (H[facility]) 12/22/2013  . Dyslipidemia 09/23/2006  . RHINITIS, ALLERGIC 09/23/2006  . REFLUX ESOPHAGITIS 09/23/2006  . DIVERTICULOSIS OF COLON 09/23/2006  . BACK PAIN,  LOW 09/23/2006  . INSOMNIA NOS 09/23/2006    Past Medical History: Past Medical History:  Diagnosis Date  . Arthritis    back  . Atrial fibrillation with rapid ventricular response ([place]) 02/2014   a. CHA2DS2VASc = 6 -> on eliquis;  b. 02/2014 s/p DCCV;  c. 08/2014 Echo: EF 55-60%, Gr 2 DD, mild MR, triv AI.  Marland Kitchen CAD (coronary artery disease)    a. s/p CABG x 2 (LIMA->LAD, VG->Diag);  b. 08/2014 Cath: LM nl, LAD 90p, LCX nl, RCA nl/dominant, LIMA->LAD atretic, VG->D2 patent w/ retrograde filling of LAD, EF 55-60%-->Med Rx.  . Diverticulitis 02/21/2012  . GERD (gastroesophageal reflux disease)   . Hyperlipemia   . Hypertension   . Insomnia     Past Surgical History: Past Surgical History:  Procedure Laterality Date  . ABDOMINAL HYSTERECTOMY  1975  . CARDIAC CATHETERIZATION N/A 05/09/2015   Procedure: Right Heart Cath;  Surgeon: Larey Dresser, MD;  Location: [facility];  Service: Cardiovascular;  Laterality: N/A;  . CARDIOVERSION N/A 02/28/2014   Procedure: CARDIOVERSION;  Surgeon: Sanda Klein, MD;  Location: [facility];  Service: Cardiovascular;  Laterality: N/A;  . CATARACT EXTRACTION Bilateral 2014   with lens implanted  . COLONOSCOPY N/A 02/19/2015   Procedure: COLONOSCOPY;  Surgeon: Ladene Artist, MD;  Location: [facility];  Service:  Endoscopy;  Laterality: N/A;  . CORONARY ARTERY BYPASS GRAFT  2006   off-pump bypass surgery with LIMA to the LAD and SVG to the second diagonal artery ( performed by Dr Servando Snare)   . JOINT REPLACEMENT Bilateral    L-2004, R-2006  . LEFT HEART CATHETERIZATION WITH CORONARY/GRAFT ANGIOGRAM N/A 09/17/2014   Procedure: LEFT HEART CATHETERIZATION WITH Beatrix Fetters;  Surgeon: Troy Sine, MD;  Location: [facility];  Service: Cardiovascular;  Laterality: N/A;  . REIMPLANTATION OF TOTAL KNEE Right 10/08/2014   Procedure: REIMPLANTATION OF RIGHT TOTAL KNEE ARTHROPLASTY WITH REMOVAL OF ANTIBIOTIC SPACER;  Surgeon: Paralee Cancel, MD;   Location: [facility];  Service: Orthopedics;  Laterality: Right;  . ROTATOR CUFF REPAIR Bilateral    r-1999, l- 2005  . TEE WITHOUT CARDIOVERSION N/A 02/28/2014   Procedure: TRANSESOPHAGEAL ECHOCARDIOGRAM (TEE);  Surgeon: Sanda Klein, MD;  Location: [facility];  Service: Cardiovascular;  Laterality: N/A;  . TOTAL KNEE ARTHROPLASTY Right 07/02/2014   Procedure: Resection of Infected Right Total Knee Arthroplasty with placement antibiotic spacer;  Surgeon: Mauri Pole, MD;  Location: [facility];  Service: Orthopedics;  Laterality: Right;    Social History: Social History   Tobacco Use  . Smoking status: Never Smoker  . Smokeless tobacco: Never Used  Substance Use Topics  . Alcohol use: No  . Drug use: No   Additional social history: lives alone but states has good support network with family and friends Please also refer to relevant sections of EMR.  Family History: Family History  Problem Relation Age of Onset  . Parkinson's disease Mother   . Heart attack Brother   . Arthritis Brother   . Cancer Brother        Unknown  . Breast cancer Neg Hx      Allergies and Medications: Allergies  Allergen Reactions  . Atorvastatin Other (See Comments)    Myalgias in legs  . Crestor [Rosuvastatin] Other (See Comments)    Myalgias in legs  . Morphine And Related Other (See Comments)    "Crazy thoughts, severe headache, sick"  . Prednisone     On two occasions has caused A fib   No current facility-administered medications on file prior to encounter.    Current Outpatient Medications on File Prior to Encounter  Medication Sig Dispense Refill  . acetaminophen (TYLENOL) 500 MG tablet Take 500 mg by mouth every 6 (six) hours as needed for moderate pain (pain/headache.).     Marland Kitchen ALPRAZolam (XANAX) 0.5 MG tablet TAKE 1 TABLET BY MOUTH TWICE A DAY AS NEEDED FOR ANXIETY (Patient taking differently: Take 0.5 mg by mouth 2 (two) times daily as needed (anxiety). ) 60 tablet 5  . amiodarone  (PACERONE) 100 MG tablet Take 2 tablets ( 200 mg ) daily (Patient taking differently: Take 200 mg by mouth daily. ) 180 tablet 3  . apixaban (ELIQUIS) 2.5 MG TABS tablet Take 1 tablet (2.5 mg total) by mouth 2 (two) times daily. 180 tablet 1  . gabapentin (NEURONTIN) 100 MG capsule TAKE 1 CAPSULE (100 MG TOTAL) BY MOUTH 3 (THREE) TIMES DAILY. 270 capsule 3  . Iron-Vitamins (GERITOL COMPLETE PO) Take 1 tablet by mouth daily.    . isosorbide mononitrate (IMDUR) 30 MG 24 hr tablet Take 1 tablet (30 mg total) by mouth daily. (Patient taking differently: Take 30 mg by mouth every evening. ) 90 tablet 3  . levothyroxine (SYNTHROID, LEVOTHROID) 75 MCG tablet Take 1 tablet (75 mcg total) by mouth daily. [place]  tablet 3  . meclizine (ANTIVERT) 12.5 MG tablet Take 1 tablet (12.5 mg total) by mouth 3 (three) times daily as needed for dizziness. 30 tablet 2  . metoprolol succinate (TOPROL-XL) 25 MG 24 hr tablet Take 0.5 tablets (12.5 mg total) by mouth daily. 45 tablet 3  . nitroGLYCERIN (NITROSTAT) 0.4 MG SL tablet PLACE 1 TABLET UNDER THE TONGUE EVERY 5 MINUTES AS NEEDED FOR CHEST PAIN UP TO 3 DOSES (Patient taking differently: Place 0.4 mg under the tongue every 5 (five) minutes x 3 doses as needed for chest pain. PLACE 1 TABLET UNDER THE TONGUE EVERY 5 MINUTES AS NEEDED FOR CHEST PAIN UP TO 3 DOSES) 25 tablet 1  . Omega-3 Fatty Acids (FISH OIL) 1000 MG CAPS Take 1,000 mg by mouth 2 (two) times a day.     Marland Kitchen omeprazole (PRILOSEC) 20 MG capsule TAKE ONE CAPSULE BY MOUTH EVERY DAY (Patient taking differently: Take 20 mg by mouth daily. ) 90 capsule 3  . Polyethyl Glycol-Propyl Glycol (LUBRICANT EYE DROPS) 0.4-0.3 % SOLN Place 1-2 drops into both eyes 3 (three) times daily as needed (dry/irritated eyes.).    Marland Kitchen polyethylene glycol powder (GLYCOLAX/MIRALAX) powder MIX 17 G AND TAKE BY MOUTH DAILY. (Patient taking differently: Take 17 g by mouth 3 (three) times a week. ) 527 g 12  . rOPINIRole (REQUIP) 0.25 MG tablet TAKE  1 TABLET BY MOUTH 3 TIMES A DAY (Patient taking differently: Take 0.25 mg by mouth 3 (three) times daily. ) 270 tablet 3  . simvastatin (ZOCOR) 20 MG tablet TAKE 1 TABLET BY MOUTH EVERYDAY AT BEDTIME (Patient taking differently: Take 20 mg by mouth at bedtime. ) 90 tablet 3  . traMADol (ULTRAM) 50 MG tablet TAKE 1 TABLET BY MOUTH TWICE A DAY AS NEEDED FOR PAIN (Patient taking differently: Take 50 mg by mouth every 12 (twelve) hours as needed (pain). ) 60 tablet 5  . traZODone (DESYREL) 100 MG tablet TAKE 1 TABLET (100 MG TOTAL) BY MOUTH AT BEDTIME. 90 tablet 3    Objective: BP 137/76   Pulse (!) 119   Temp 98.3 F (36.8 C) (Oral)   Resp 18   Ht 5\' 1"  (1.549 m)   Wt 59.9 kg   SpO2 98%   BMI 24.94 kg/m  Exam: General: Pleasant and cooperative lady in NAD  Eyes: PERRLA ENTM:not assessed   Neck: no bruits noted   Cardiovascular: IRR, no murmurs appreciated  Respiratory: CTAB, no wheezing, on room air Gastrointestinal: BS present in all quadrants, diffuse tenderness to palpation, DRE negative for stool and blood MSK: distal pulses present bilaterally  Derm: no rashes, abrasions, redness   Neuro: Alert and orientated x3, good strength in all extremities Psych: Pleasant and cooperative  Labs and Imaging: CBC BMET  Recent Labs  Lab 01/28/19 1142  WBC 4.3  HGB 10.9*  HCT 33.8*  PLT 157   Recent Labs  Lab 01/28/19 1142  NA 140  K 4.9  CL 107  CO2 24  BUN 19  CREATININE 1.80*  GLUCOSE 95  CALCIUM 9.5     No results found.  Sherene Sires, DO 01/28/2019, 1:32 PM PGY-3, [place] Intern pager: [phone], text pages welcome

## 2019-01-29 ENCOUNTER — Other Ambulatory Visit: Payer: Self-pay

## 2019-01-29 DIAGNOSIS — Z7901 Long term (current) use of anticoagulants: Secondary | ICD-10-CM | POA: Diagnosis not present

## 2019-01-29 DIAGNOSIS — K625 Hemorrhage of anus and rectum: Secondary | ICD-10-CM | POA: Diagnosis present

## 2019-01-29 DIAGNOSIS — I340 Nonrheumatic mitral (valve) insufficiency: Secondary | ICD-10-CM | POA: Diagnosis not present

## 2019-01-29 DIAGNOSIS — I700 Atherosclerosis of aorta: Secondary | ICD-10-CM | POA: Diagnosis present

## 2019-01-29 DIAGNOSIS — I480 Paroxysmal atrial fibrillation: Secondary | ICD-10-CM | POA: Diagnosis present

## 2019-01-29 DIAGNOSIS — D500 Iron deficiency anemia secondary to blood loss (chronic): Secondary | ICD-10-CM | POA: Diagnosis present

## 2019-01-29 DIAGNOSIS — Z8719 Personal history of other diseases of the digestive system: Secondary | ICD-10-CM | POA: Diagnosis not present

## 2019-01-29 DIAGNOSIS — I5032 Chronic diastolic (congestive) heart failure: Secondary | ICD-10-CM | POA: Diagnosis present

## 2019-01-29 DIAGNOSIS — K921 Melena: Secondary | ICD-10-CM | POA: Diagnosis present

## 2019-01-29 DIAGNOSIS — I4811 Longstanding persistent atrial fibrillation: Secondary | ICD-10-CM | POA: Diagnosis not present

## 2019-01-29 DIAGNOSIS — I130 Hypertensive heart and chronic kidney disease with heart failure and stage 1 through stage 4 chronic kidney disease, or unspecified chronic kidney disease: Secondary | ICD-10-CM | POA: Diagnosis present

## 2019-01-29 DIAGNOSIS — K922 Gastrointestinal hemorrhage, unspecified: Secondary | ICD-10-CM

## 2019-01-29 DIAGNOSIS — F419 Anxiety disorder, unspecified: Secondary | ICD-10-CM | POA: Diagnosis present

## 2019-01-29 DIAGNOSIS — N184 Chronic kidney disease, stage 4 (severe): Secondary | ICD-10-CM

## 2019-01-29 DIAGNOSIS — K640 First degree hemorrhoids: Secondary | ICD-10-CM | POA: Diagnosis present

## 2019-01-29 DIAGNOSIS — Z79890 Hormone replacement therapy (postmenopausal): Secondary | ICD-10-CM | POA: Diagnosis not present

## 2019-01-29 DIAGNOSIS — E785 Hyperlipidemia, unspecified: Secondary | ICD-10-CM | POA: Diagnosis present

## 2019-01-29 DIAGNOSIS — Z8249 Family history of ischemic heart disease and other diseases of the circulatory system: Secondary | ICD-10-CM | POA: Diagnosis not present

## 2019-01-29 DIAGNOSIS — M810 Age-related osteoporosis without current pathological fracture: Secondary | ICD-10-CM | POA: Diagnosis present

## 2019-01-29 DIAGNOSIS — Z951 Presence of aortocoronary bypass graft: Secondary | ICD-10-CM | POA: Diagnosis not present

## 2019-01-29 DIAGNOSIS — Z20828 Contact with and (suspected) exposure to other viral communicable diseases: Secondary | ICD-10-CM | POA: Diagnosis present

## 2019-01-29 DIAGNOSIS — I2721 Secondary pulmonary arterial hypertension: Secondary | ICD-10-CM | POA: Diagnosis present

## 2019-01-29 DIAGNOSIS — Z79899 Other long term (current) drug therapy: Secondary | ICD-10-CM | POA: Diagnosis not present

## 2019-01-29 DIAGNOSIS — K219 Gastro-esophageal reflux disease without esophagitis: Secondary | ICD-10-CM | POA: Diagnosis present

## 2019-01-29 DIAGNOSIS — I4892 Unspecified atrial flutter: Secondary | ICD-10-CM | POA: Diagnosis present

## 2019-01-29 DIAGNOSIS — I2510 Atherosclerotic heart disease of native coronary artery without angina pectoris: Secondary | ICD-10-CM | POA: Diagnosis present

## 2019-01-29 DIAGNOSIS — I441 Atrioventricular block, second degree: Secondary | ICD-10-CM | POA: Diagnosis present

## 2019-01-29 DIAGNOSIS — G2581 Restless legs syndrome: Secondary | ICD-10-CM | POA: Diagnosis present

## 2019-01-29 DIAGNOSIS — E039 Hypothyroidism, unspecified: Secondary | ICD-10-CM | POA: Diagnosis present

## 2019-01-29 LAB — CBC
HCT: 33.5 % — ABNORMAL LOW (ref 36.0–46.0)
HCT: 30.6 % — ABNORMAL LOW (ref 36.0–46.0)
Hemoglobin: 10.8 g/dL — ABNORMAL LOW (ref 12.0–15.0)
Hemoglobin: 9.9 g/dL — ABNORMAL LOW (ref 12.0–15.0)
MCH: 30.8 pg (ref 26.0–34.0)
MCH: 31.2 pg (ref 26.0–34.0)
MCHC: 32.2 g/dL (ref 30.0–36.0)
MCHC: 32.4 g/dL (ref 30.0–36.0)
MCV: 95.4 fL (ref 80.0–100.0)
MCV: 96.5 fL (ref 80.0–100.0)
Platelets: 157 10*3/uL (ref 150–400)
Platelets: 147 10*3/uL — ABNORMAL LOW (ref 150–400)
RBC: 3.51 MIL/uL — ABNORMAL LOW (ref 3.87–5.11)
RBC: 3.17 MIL/uL — ABNORMAL LOW (ref 3.87–5.11)
RDW: 14.2 % (ref 11.5–15.5)
RDW: 14 % (ref 11.5–15.5)
WBC: 4.7 10*3/uL (ref 4.0–10.5)
WBC: 4.4 10*3/uL (ref 4.0–10.5)
nRBC: 0 % (ref 0.0–0.2)
nRBC: 0 % (ref 0.0–0.2)

## 2019-01-29 LAB — BASIC METABOLIC PANEL
Anion gap: 8 (ref 5–15)
BUN: 17 mg/dL (ref 8–23)
CO2: 24 mmol/L (ref 22–32)
Calcium: 9.3 mg/dL (ref 8.9–10.3)
Chloride: 109 mmol/L (ref 98–111)
Creatinine, Ser: 1.6 mg/dL — ABNORMAL HIGH (ref 0.44–1.00)
GFR calc Af Amer: 33 mL/min — ABNORMAL LOW (ref >60)
GFR calc non Af Amer: 29 mL/min — ABNORMAL LOW (ref >60)
Glucose, Bld: 84 mg/dL (ref 70–99)
Potassium: 4.3 mmol/L (ref 3.5–5.1)
Sodium: 141 mmol/L (ref 135–145)

## 2019-01-29 LAB — TROPONIN I (HIGH SENSITIVITY): Troponin I (High Sensitivity): 9 ng/L (ref <18)

## 2019-01-29 MED ORDER — APIXABAN 2.5 MG PO TABS
2.5000 mg | ORAL_TABLET | Freq: Two times a day (BID) | ORAL | Status: DC
  Administered 2019-01-29 – 2019-01-30 (×3): 2.5 mg via ORAL
  Filled 2019-01-29 (×3): qty 1

## 2019-01-29 MED ORDER — ALPRAZOLAM 0.5 MG PO TABS
0.5000 mg | ORAL_TABLET | Freq: Every evening | ORAL | Status: DC | PRN
  Administered 2019-01-29: 21:00:00 0.5 mg via ORAL
  Filled 2019-01-29: qty 1

## 2019-01-29 NOTE — Progress Notes (Signed)
Called by RN, pt's BP elevated and c/o back pain, pt was given tylenol at 2044,  advised RN to repostion pt and adjust cuff for repeat BP check and call if no improvement.    RN paged and pt now c/o SOB, BP 140/99.  Went to pt's room, in NAD.  Pt c/o's chest tightness, midsternum.  No chest pain,  Feels like she's a little winded and is concerned about bleeding from rectum.  She states she has no bleeding when laying down and it only happens when she goes to the BR.     Plan:  EKG- A.Fib, irregular rhythm withcontrolled ventricular rate           Troponin- 9           CBC- Hbg 9.9, decreased from 10.8 this am   Update from RN that pt is currently sleeping

## 2019-01-29 NOTE — Progress Notes (Signed)
Pt BP 171/114.  Checked multiple times.  Notified family medicine's Volanda Napoleon, MD

## 2019-01-29 NOTE — Progress Notes (Addendum)
Family Medicine Teaching Service Daily Progress Note Intern Pager: [phone]  Patient name: Jacqueline [name] Medical record number: [id number] Date of birth: 09-14-1930 Age: 85 y.o. Gender: female  Primary Care Provider: [name], MD Consultants: none Code Status: full  Pt Overview and Major Events to Date:  Admitted 7/4  Assessment and Plan:  Afib RVR, CAD- HR in low 100s. (98-122 overnight). eliquis was held overnight given rectal bleeding and put on SCDs for DVT prophylaxis. She had scheduled cardioversion 7/6 so will contact cards for recs on anticoagulation at this time. Possible TTE/TEE to evaluate thrombus alternatively so could receive the version. Hgb has been stable though. Spoke to patient about risks/benefits of anticoagulation and she had no preference and wanted to leave the decision "to the experts". Got dose of amiodarone yest am. Spoke to cardiology who recommended restarting eliquis at this time. They will plan to do TEE assisted cardioversion tomorrow. Patient has essentially not missed a dose of eliquis. - continue telemetry - f/u cards for guidance on anticoagulation done - decision on anticoagulation- restart eliquis - hgb daily - continue home meds  - IMDUR, metop, nitro prn, simvastatin   BRBPR, unknown source. Hgb stable 10.8 this am. Patient remains stable and same as yesterday per patient. Continues to have dripping BRBPR today. Had colonoscopy 2018 which showed internal hemorrhoids and mild diverticulosis. - consider GI consult - Hgb daily  GERD - continue home meds  - protonix  Restless leg - continue home meds  - ropinirole TID  - gabapentin TID - xanax and trazodone nightly   FEN/GI: regular diet, protonix PPx: SCDs, held eliquis overnight  Disposition: stay today for stabilization and cardioversion tomorrow possibly  Subjective:  Patient states she feels the same today as yesterday and did not sleep much overnight since she didn't get  her xanax. She didn't eat much breakfast because the food is terrible but denies abdominal pain or N/V. Had dripping bright red blood per rectum continuously overnight and this am. She is concerned that she is not getting all her home medications so we discussed and I said I would review her chart.   Objective: Temp:  [97.7 F (36.5 C)-98.6 F (37 C)] 97.7 F (36.5 C) (07/05 0433) Pulse Rate:  [76-125] 119 (07/05 0433) Resp:  [16-20] 18 (07/05 0433) BP: (112-170)/(51-110) 144/98 (07/05 0433) SpO2:  [92 %-100 %] 97 % (07/05 0433) Weight:  [59 kg-59.9 kg] 59.3 kg (07/05 0433) Physical Exam: General: well-appearing, pleasant Cardiovascular: regular rate, irregular rhythm, no murmurs appreciated Respiratory: ORA, no increased WOB, CTAB Abdomen: soft, mildly tender to palpation diffusely, negative acute, no masses appreciated Extremities: moving all appropriately, trace edema LE bilaterally  Laboratory: Recent Labs  Lab 01/28/19 1142 01/28/19 2022 01/29/19 0335  WBC 4.3 3.7* 4.7  HGB 10.9* 10.7* 10.8*  HCT 33.8* 32.2* 33.5*  PLT 157 151 157   Recent Labs  Lab 01/25/19 1057 01/28/19 1142 01/29/19 0335  NA 139 140 141  K 4.7 4.9 4.3  CL 101 107 109  CO2 23 24 24   BUN 26 19 17   CREATININE 1.77* 1.80* 1.60*  CALCIUM 9.7 9.5 9.3  PROT  --  6.2*  --   BILITOT  --  0.4  --   ALKPHOS  --  52  --   ALT  --  23  --   AST  --  28  --   GLUCOSE 104* 95 84   TSH 2.375 PT/INR 14.8/1.2  Imaging/Diagnostic Tests: No results  found.   Richarda Osmond, DO 01/29/2019, 7:30 AM PGY-2, [place] Intern pager: [phone], text pages welcome

## 2019-01-29 NOTE — Discharge Summary (Signed)
[facility] Discharge Summary  Patient name: Jacqueline [name] Medical record number: [id number] Date of birth: 1931-02-15 Age: 85 y.o. Gender: female Date of Admission: 01/28/2019  Date of Discharge: 01/30/2019 Admitting Physician: Blane Ohara McDiarmid, MD  Primary Care Provider: [name], MD Consultants: cards  Indication for Hospitalization: Afib needing cardioversion and rectal bleeding  Discharge Diagnoses/Problem List:  Afib s/p cardioversion with TEE on 7/6 which converted to sinus rhythm BRBPR GERD Restless leg anxiety  Disposition: discharge home  Discharge Condition: improved  Discharge Exam: (from daily progress note) General: well-appearing, pleasant Cardiovascular: regular rate, irregular rhythm, no murmurs appreciated Update: regular rate and rhythm, no murmur appreciated Respiratory: ORA, no increased WOB, CTAB Abdomen: soft, non-tender to palpation Rectal: negative for active bleeding. No external hemorrhoids. Good sphincter tone. Possible internal hemorrhoids palpated- no blood on finger. Patient denied pain on exam. Extremities: moving all appropriately, negative edema LE bilaterally  Brief Hospital Course:  Afib- pt presented in RVR on eliquis for anticoagulation in preparation for planned cardioversion 7/6. She was stable and asymptomatic but remained tachycardic until the procedure which converted her to sinus rhythm. She had one episode of chest discomfort the night prior to CV with negative troponins and non ischemic ECG- likely 2/2 patient's well established anxiety. Anticoagulation was held for about 1 day on admission 2/2 active rectal bleeding so the procedure was used with TEE to ensure no presence of thrombus. There were no complications. Patient was stable for discharge on same day of procedure and will continue on eliquis until cardiology follow up.  BRBPR- patient has history of rectal bleed with colonoscopy 2018 showing  internal hemorrhoids and diverticulosis. hgb remained stable throughout admission around 10. Rectal exam revealed no active bleeding and non-tender internal hemorrhoids. No intervention this admission and bleeding stopped spontaneously the day prior to dc.  Issues for Follow Up:  1. Repeat hgb day after dc stable 9.5- consider surgical referral if rectal bleeding continues to be severe interference. Could also consider iron supplement for anemia 2. Will need follow up with cardiology- continue eliquis until cleared 3. Consider reviewing patient's anxiety medications  Significant Procedures: cardioversion 7/6  Significant Labs and Imaging:  Recent Labs  Lab 01/29/19 0335 01/29/19 2213 01/30/19 0633 01/31/19 0850  WBC 4.7 4.4 4.4  --   HGB 10.8* 9.9* 9.7* 9.5*  HCT 33.5* 30.6* 29.8*  --   PLT 157 147* 146*  --    Recent Labs  Lab 01/25/19 1057 01/28/19 1142 01/29/19 0335 01/30/19 0633  NA 139 140 141 142  K 4.7 4.9 4.3 4.2  CL 101 107 109 112*  CO2 23 24 24 24   GLUCOSE 104* 95 84 99  BUN 26 19 17 17   CREATININE 1.77* 1.80* 1.60* 1.72*  CALCIUM 9.7 9.5 9.3 8.8*  ALKPHOS  --  52  --   --   AST  --  28  --   --   ALT  --  23  --   --   ALBUMIN  --  3.6  --   --     Results/Tests Pending at Time of Discharge: none  Discharge Medications:  Allergies as of 01/30/2019      Reactions   Atorvastatin Other (See Comments)   Myalgias in legs   Crestor [rosuvastatin] Other (See Comments)   Myalgias in legs   Morphine And Related Other (See Comments)   "Crazy thoughts, severe headache, sick"   Prednisone    On two  occasions has caused A fib      Medication List    STOP taking these medications   omeprazole 20 MG capsule Commonly known as: PRILOSEC     TAKE these medications   acetaminophen 500 MG tablet Commonly known as: TYLENOL Take 500 mg by mouth every 6 (six) hours as needed for moderate pain (pain/headache.). Notes to patient: Last given 01/29/2019 at 8:44 pm    ALPRAZolam 0.5 MG tablet Commonly known as: XANAX TAKE 1 TABLET BY MOUTH TWICE A DAY AS NEEDED FOR ANXIETY What changed: See the new instructions. Notes to patient: Last given 01/29/2019 at 8:44 pm   amiodarone 100 MG tablet Commonly known as: Pacerone Take 2 tablets ( 200 mg ) daily What changed:   how much to take  how to take this  when to take this  additional instructions Notes to patient: Tomorrow    apixaban 2.5 MG Tabs tablet Commonly known as: Eliquis Take 1 tablet (2.5 mg total) by mouth 2 (two) times daily. Notes to patient: This evening   Fish Oil 1000 MG Caps Take 1,000 mg by mouth 2 (two) times a day. Notes to patient: This evening   gabapentin 100 MG capsule Commonly known as: NEURONTIN TAKE 1 CAPSULE (100 MG TOTAL) BY MOUTH 3 (THREE) TIMES DAILY.   GERITOL COMPLETE PO Take 1 tablet by mouth daily.   isosorbide mononitrate 30 MG 24 hr tablet Commonly known as: IMDUR Take 1 tablet (30 mg total) by mouth daily. What changed: when to take this   levothyroxine 75 MCG tablet Commonly known as: SYNTHROID Take 1 tablet (75 mcg total) by mouth daily.   Lubricant Eye Drops 0.4-0.3 % Soln Generic drug: Polyethyl Glycol-Propyl Glycol Place 1-2 drops into both eyes 3 (three) times daily as needed (dry/irritated eyes.).   meclizine 12.5 MG tablet Commonly known as: ANTIVERT Take 1 tablet (12.5 mg total) by mouth 3 (three) times daily as needed for dizziness.   metoprolol succinate 25 MG 24 hr tablet Commonly known as: TOPROL-XL Take 0.5 tablets (12.5 mg total) by mouth daily.   nitroGLYCERIN 0.4 MG SL tablet Commonly known as: NITROSTAT PLACE 1 TABLET UNDER THE TONGUE EVERY 5 MINUTES AS NEEDED FOR CHEST PAIN UP TO 3 DOSES What changed:   how much to take  how to take this  when to take this  reasons to take this   polyethylene glycol powder 17 GM/SCOOP powder Commonly known as: GLYCOLAX/MIRALAX MIX 17 G AND TAKE BY MOUTH DAILY. What changed:  See the new instructions.   rOPINIRole 0.25 MG tablet Commonly known as: REQUIP TAKE 1 TABLET BY MOUTH 3 TIMES A DAY What changed:   how much to take  how to take this  when to take this  additional instructions   simvastatin 20 MG tablet Commonly known as: ZOCOR TAKE 1 TABLET BY MOUTH EVERYDAY AT BEDTIME What changed: See the new instructions.   traMADol 50 MG tablet Commonly known as: ULTRAM TAKE 1 TABLET BY MOUTH TWICE A DAY AS NEEDED FOR PAIN What changed:   when to take this  reasons to take this  additional instructions   traZODone 100 MG tablet Commonly known as: DESYREL TAKE 1 TABLET (100 MG TOTAL) BY MOUTH AT BEDTIME.       Discharge Instructions: Please refer to Patient Instructions section of EMR for full details.  Patient was counseled important signs and symptoms that should prompt return to medical care, changes in medications, dietary instructions, activity restrictions, and  follow up appointments.   Follow-Up Appointments: Follow-up Information    Erlene Quan, PA-C Follow up on 02/17/2019.   Specialties: Cardiology, Radiology Why: Please arrive 15 minutes early for your 1:15pm appointment Contact information: [address] [place] [address] [phone]        [name], MD Follow up on 01/31/2019.   Specialty: Family Medicine Why: 7/7 at [place] for a lab draw to check your blood levels 7/8 at 4:10pm for an appt with Dr. Huntley Dec information: [place] [address] [place], [place], DO 01/31/2019, 10:36 PM PGY-2, [place]

## 2019-01-29 NOTE — H&P (View-Only) (Signed)
Called by RN, pt's BP elevated and c/o back pain, pt was given tylenol at 2044,  advised RN to repostion pt and adjust cuff for repeat BP check and call if no improvement.    RN paged and pt now c/o SOB, BP 140/99.  Went to pt's room, in NAD.  Pt c/o's chest tightness, midsternum.  No chest pain,  Feels like she's a little winded and is concerned about bleeding from rectum.  She states she has no bleeding when laying down and it only happens when she goes to the BR.     Plan:  EKG- A.Fib, irregular rhythm withcontrolled ventricular rate           Troponin- 9           CBC- Hbg 9.9, decreased from 10.8 this am   Update from RN that pt is currently sleeping

## 2019-01-30 ENCOUNTER — Encounter (HOSPITAL_COMMUNITY): Payer: Self-pay | Admitting: Anesthesiology

## 2019-01-30 ENCOUNTER — Inpatient Hospital Stay (HOSPITAL_COMMUNITY): Payer: PPO

## 2019-01-30 ENCOUNTER — Other Ambulatory Visit: Payer: Self-pay

## 2019-01-30 ENCOUNTER — Inpatient Hospital Stay (HOSPITAL_COMMUNITY): Payer: PPO | Admitting: Certified Registered"

## 2019-01-30 ENCOUNTER — Ambulatory Visit (HOSPITAL_COMMUNITY): Admission: RE | Admit: 2019-01-30 | Payer: PPO | Source: Home / Self Care | Admitting: Internal Medicine

## 2019-01-30 ENCOUNTER — Encounter (HOSPITAL_COMMUNITY)
Admission: EM | Disposition: A | Discharge status: Home / Self Care | Payer: Self-pay | Source: Home / Self Care | Attending: Family Medicine

## 2019-01-30 DIAGNOSIS — I480 Paroxysmal atrial fibrillation: Secondary | ICD-10-CM

## 2019-01-30 DIAGNOSIS — I340 Nonrheumatic mitral (valve) insufficiency: Secondary | ICD-10-CM

## 2019-01-30 HISTORY — PX: CARDIOVERSION: SHX1299

## 2019-01-30 HISTORY — PX: TEE WITHOUT CARDIOVERSION: SHX5443

## 2019-01-30 LAB — CBC
HCT: 29.8 % — ABNORMAL LOW (ref 36.0–46.0)
Hemoglobin: 9.7 g/dL — ABNORMAL LOW (ref 12.0–15.0)
MCH: 31.4 pg (ref 26.0–34.0)
MCHC: 32.6 g/dL (ref 30.0–36.0)
MCV: 96.4 fL (ref 80.0–100.0)
Platelets: 146 10*3/uL — ABNORMAL LOW (ref 150–400)
RBC: 3.09 MIL/uL — ABNORMAL LOW (ref 3.87–5.11)
RDW: 13.9 % (ref 11.5–15.5)
WBC: 4.4 10*3/uL (ref 4.0–10.5)
nRBC: 0 % (ref 0.0–0.2)

## 2019-01-30 LAB — BASIC METABOLIC PANEL
Anion gap: 6 (ref 5–15)
BUN: 17 mg/dL (ref 8–23)
CO2: 24 mmol/L (ref 22–32)
Calcium: 8.8 mg/dL — ABNORMAL LOW (ref 8.9–10.3)
Chloride: 112 mmol/L — ABNORMAL HIGH (ref 98–111)
Creatinine, Ser: 1.72 mg/dL — ABNORMAL HIGH (ref 0.44–1.00)
GFR calc Af Amer: 31 mL/min — ABNORMAL LOW (ref >60)
GFR calc non Af Amer: 26 mL/min — ABNORMAL LOW (ref >60)
Glucose, Bld: 99 mg/dL (ref 70–99)
Potassium: 4.2 mmol/L (ref 3.5–5.1)
Sodium: 142 mmol/L (ref 135–145)

## 2019-01-30 LAB — TROPONIN I (HIGH SENSITIVITY): Troponin I (High Sensitivity): 8 ng/L (ref <18)

## 2019-01-30 SURGERY — CARDIOVERSION
Anesthesia: General

## 2019-01-30 MED ORDER — SODIUM CHLORIDE 0.9 % IV SOLN: INTRAVENOUS | Status: DC

## 2019-01-30 MED ORDER — SODIUM CHLORIDE 0.9% FLUSH: 3.0000 mL | Freq: Two times a day (BID) | INTRAVENOUS | Status: DC

## 2019-01-30 MED ORDER — PROPOFOL 10 MG/ML IV BOLUS
INTRAVENOUS | Status: DC | PRN
  Administered 2019-01-30: 20 mg via INTRAVENOUS

## 2019-01-30 MED ORDER — PROPOFOL 500 MG/50ML IV EMUL
INTRAVENOUS | Status: DC | PRN
  Administered 2019-01-30: 100 ug/kg/min via INTRAVENOUS

## 2019-01-30 MED ORDER — BUTAMBEN-TETRACAINE-BENZOCAINE 2-2-14 % EX AERO
INHALATION_SPRAY | CUTANEOUS | Status: DC | PRN
  Administered 2019-01-30: 10:00:00 1 via TOPICAL

## 2019-01-30 MED ORDER — SODIUM CHLORIDE 0.9% FLUSH: 3.0000 mL | INTRAVENOUS | Status: DC | PRN

## 2019-01-30 MED ORDER — SODIUM CHLORIDE 0.9 % IV SOLN
250.0000 mL | INTRAVENOUS | Status: DC
  Administered 2019-01-30: 10:00:00 via INTRAVENOUS

## 2019-01-30 NOTE — CV Procedure (Signed)
TEE  LA, LAA without masses NO PFO by color doppler AV is mildly thickened.  Trivial AI MV is normal  At least moderate MR that is cnetrally directed TV is normal  Trace TR PV is normal   LVEF appears mildly depressed Mild plaquing of the thoracic aorta   Full report to follow

## 2019-01-30 NOTE — CV Procedure (Signed)
Cardioversion   PT sedated by anesthesia with Propofol   With pads in the AP position, Pt cardioverted to SR with 200 J synchronized biphasic energy   Procedure was without complication  12 lead EKG ordered

## 2019-01-30 NOTE — Transfer of Care (Signed)
Immediate Anesthesia Transfer of Care Note  Patient: Jacqueline [name]  Procedure(s) Performed: CARDIOVERSION (N/A ) TRANSESOPHAGEAL ECHOCARDIOGRAM (TEE) (N/A )  Patient Location: Endoscopy Unit  Anesthesia Type:General  Level of Consciousness: drowsy and patient cooperative  Airway & Oxygen Therapy: Patient Spontanous Breathing  Post-op Assessment: Report given to RN and Post -op Vital signs reviewed and stable  Post vital signs: Reviewed and stable  Last Vitals:  Vitals Value Taken Time  BP    Temp    Pulse 53 01/30/19 1007  Resp 16 01/30/19 1007  SpO2 96 % 01/30/19 1007  Vitals shown include unvalidated device data.  Last Pain:  Vitals:   01/30/19 0824  TempSrc: Oral  PainSc: 0-No pain      Patients Stated Pain Goal: 0 (12/81/18 8677)  Complications: No apparent anesthesia complications

## 2019-01-30 NOTE — Discharge Instructions (Addendum)
Continue our eliquis. Follow up with your PCP to get your hgb checked within a week of discharge. If you are still experiencing bleeding, they may want to refer you to surgery.   Information on my medicine - ELIQUIS (apixaban)  Why was Eliquis prescribed for you? Eliquis was prescribed for you to reduce the risk of a blood clot forming that can cause a stroke if you have a medical condition called atrial fibrillation (a type of irregular heartbeat).  What do You need to know about Eliquis ? Take your Eliquis TWICE DAILY - one tablet in the morning and one tablet in the evening with or without food. If you have difficulty swallowing the tablet whole please discuss with your pharmacist how to take the medication safely.  Take Eliquis exactly as prescribed by your doctor and DO NOT stop taking Eliquis without talking to the doctor who prescribed the medication.  Stopping may increase your risk of developing a stroke.  Refill your prescription before you run out.  After discharge, you should have regular check-up appointments with your healthcare provider that is prescribing your Eliquis.  In the future your dose may need to be changed if your kidney function or weight changes by a significant amount or as you get older.  What do you do if you miss a dose? If you miss a dose, take it as soon as you remember on the same day and resume taking twice daily.  Do not take more than one dose of ELIQUIS at the same time to make up a missed dose.  Important Safety Information A possible side effect of Eliquis is bleeding. You should call your healthcare provider right away if you experience any of the following: ? Bleeding from an injury or your nose that does not stop. ? Unusual colored urine (red or dark brown) or unusual colored stools (red or black). ? Unusual bruising for unknown reasons. ? A serious fall or if you hit your head (even if there is no bleeding).  Some medicines may interact  with Eliquis and might increase your risk of bleeding or clotting while on Eliquis. To help avoid this, consult your healthcare provider or pharmacist prior to using any new prescription or non-prescription medications, including herbals, vitamins, non-steroidal anti-inflammatory drugs (NSAIDs) and supplements.  This website has more information on Eliquis (apixaban): http://www.eliquis.com/eliquis/home

## 2019-01-30 NOTE — Progress Notes (Signed)
    [facility] has been requested to perform a transesophageal echocardiogram on 01/30/2019 for atrial fibrillation.  After careful review of history and examination, the risks and benefits of transesophageal echocardiogram have been explained including risks of esophageal damage, perforation (1:10,000 risk), bleeding, pharyngeal hematoma as well as other potential complications associated with conscious sedation including aspiration, arrhythmia, respiratory failure and death. Alternatives to treatment were discussed, questions were answered. Patient is willing to proceed.    Roby Lofts, PA-C 01/30/2019 9:11 AM

## 2019-01-30 NOTE — Anesthesia Postprocedure Evaluation (Signed)
Anesthesia Post Note  Patient: Jacqueline [name]  Procedure(s) Performed: CARDIOVERSION (N/A ) TRANSESOPHAGEAL ECHOCARDIOGRAM (TEE) (N/A )     Patient location during evaluation: PACU Anesthesia Type: General Level of consciousness: awake and alert and oriented Pain management: pain level controlled Vital Signs Assessment: post-procedure vital signs reviewed and stable Respiratory status: spontaneous breathing, nonlabored ventilation and respiratory function stable Cardiovascular status: stable and blood pressure returned to baseline Postop Assessment: no apparent nausea or vomiting Anesthetic complications: no    Last Vitals:  Vitals:   01/30/19 1010 01/30/19 1020  BP: (!) 144/60 (!) 141/74  Pulse: (!) 55 63  Resp: 17 18  Temp:    SpO2: 98% 96%    Last Pain:  Vitals:   01/30/19 1020  TempSrc:   PainSc: 0-No pain                 Aqsa Sensabaugh A.

## 2019-01-30 NOTE — Anesthesia Preprocedure Evaluation (Signed)
Anesthesia Evaluation    Airway Mallampati: III  TM Distance: >3 FB Neck ROM: Full    Dental  (+) Edentulous Upper, Edentulous Lower   Pulmonary neg pulmonary ROS,    Pulmonary exam normal breath sounds clear to auscultation       Cardiovascular hypertension, Pt. on medications + angina with exertion + CAD, + CABG and +CHF   Rhythm:Irregular Rate:Tachycardia  Amiodarone therapy- last dose 01/28/2019  Echo 07/22/2017 Left ventricle: The cavity size was normal. There was mild concentric hypertrophy. Systolic function was normal. The estimated ejection fraction was in the range of 55% to 60%. Wall motion was normal; there were no regional wall motion abnormalities. Left ventricular diastolic function parameters  were normal. - Aortic valve: Trileaflet; mildly thickened leaflets. There was trivial regurgitation. - Mitral valve: There was trivial regurgitation. - Tricuspid valve: There was mild regurgitation.  CABG 2v 2006   Neuro/Psych PSYCHIATRIC DISORDERS Peripheral neuropathy  Neuromuscular disease    GI/Hepatic GERD  Medicated,  Endo/Other  Hypothyroidism Hyperlipidemia  Renal/GU Renal InsufficiencyRenal disease  negative genitourinary   Musculoskeletal  (+) Arthritis , Osteoarthritis,    Abdominal   Peds  Hematology  (+) anemia , Eliquis therapy- last dose this am   Anesthesia Other Findings   Reproductive/Obstetrics                             Anesthesia Physical Anesthesia Plan  ASA: III  Anesthesia Plan: General   Post-op Pain Management:    Induction: Intravenous  PONV Risk Score and Plan:   Airway Management Planned: Mask  Additional Equipment:   Intra-op Plan:   Post-operative Plan:   Informed Consent: I have reviewed the patients History and Physical, chart, labs and discussed the procedure including the risks, benefits and alternatives for the proposed anesthesia with  the patient or authorized representative who has indicated his/her understanding and acceptance.       Plan Discussed with: CRNA  Anesthesia Plan Comments:         Anesthesia Quick Evaluation

## 2019-01-30 NOTE — Interval H&P Note (Signed)
History and Physical Interval Note:  01/30/2019 8:40 AM  Jacqueline [name]  has presented today for surgery, with the diagnosis of [place].  The various methods of treatment have been discussed with the patient and family. After consideration of risks, benefits and other options for treatment, the patient has consented to  Procedure(s): CARDIOVERSION (N/A) as a surgical intervention.  The patient's history has been reviewed, patient examined, no change in status, stable for surgery.  I have reviewed the patient's chart and labs.  Questions were answered to the patient's satisfaction.     Dorris Carnes

## 2019-01-30 NOTE — Progress Notes (Signed)
Family Medicine Teaching Service Daily Progress Note Intern Pager: [phone]  Patient name: Jacqueline [name] Medical record number: [id number] Date of birth: 1931-04-12 Age: 85 y.o. Gender: female  Primary Care Provider: [name], MD Consultants: none Code Status: full  Pt Overview and Major Events to Date:  Admitted 7/4  Assessment and Plan:  Afib RVR, CAD- HR in low 100s. (88-124 overnight). Patient was continued on eliquis yesterday. Had some rectal bleeding during the day, but none overnight. She feels fine today. Cardioversion happening this am. Overnight, she had some chest tightness, possibly secondary to anxiety. ecg negative for ischemic changes and high sensitivity troponins were negative x2. - continue telemetry - f/u cards for dc planning - hgb daily - continue home meds  - IMDUR, metop, nitro prn, simvastatin  BRBPR, unknown source- likely internal hemorrhoid. Hgb stable (slight decrease) 9.7 this am. No more bleeding overnight. Rectal exam this am showed no bleeding or bleeding sources. No external hemorrhoids. Positive for internal hemorrhoids possibly. Had colonoscopy 2018 which showed internal hemorrhoids and mild diverticulosis. - possible referral to general surgery for hemorrhoids on dc - Hgb daily  GERD - continue home meds  - protonix  Restless leg - continue home meds  - ropinirole TID  - gabapentin TID - xanax and trazodone nightly   FEN/GI: regular diet, protonix PPx: eliquis  Disposition: planning for cardioversion today. Discharge pending cards  Subjective:  Had chest pain overnight (see separate progress note). Resolved this am. Patient feels "fine" today. Denies rectal bleeding overnight. She is NPO this am for her cardioversion which they brought her to as I was leaving.   Objective: Temp:  [97.6 F (36.4 C)-98 F (36.7 C)] 98 F (36.7 C) (07/06 0441) Pulse Rate:  [88-124] 102 (07/06 0441) Resp:  [16-20] 16 (07/06 0441) BP:  (115-171)/(73-114) 132/90 (07/06 0441) SpO2:  [96 %-100 %] 97 % (07/06 0441) Weight:  [60.3 kg] 60.3 kg (07/06 0300) Physical Exam: General: well-appearing, pleasant Cardiovascular: regular rate, irregular rhythm, no murmurs appreciated Respiratory: ORA, no increased WOB, CTAB Abdomen: soft, non-tender to palpation Rectal: negative for active bleeding. No external hemorrhoids. Good sphincter tone. Possible internal hemorrhoids palpated- no blood on finger. Patient denied pain on exam. Extremities: moving all appropriately, negative edema LE bilaterally  Laboratory: Recent Labs  Lab 01/29/19 0335 01/29/19 2213 01/30/19 0633  WBC 4.7 4.4 4.4  HGB 10.8* 9.9* 9.7*  HCT 33.5* 30.6* 29.8*  PLT 157 147* 146*   Recent Labs  Lab 01/25/19 1057 01/28/19 1142 01/29/19 0335  NA 139 140 141  K 4.7 4.9 4.3  CL 101 107 109  CO2 23 24 24   BUN 26 19 17   CREATININE 1.77* 1.80* 1.60*  CALCIUM 9.7 9.5 9.3  PROT  --  6.2*  --   BILITOT  --  0.4  --   ALKPHOS  --  52  --   ALT  --  23  --   AST  --  28  --   GLUCOSE 104* 95 84   TSH 2.375 PT/INR 14.8/1.2  Imaging/Diagnostic Tests: No results found. ECG last night shows Afib, rate of 96. Slurring of J point.  Richarda Osmond, DO 01/30/2019, 7:25 AM PGY-2, [place] Intern pager: [phone], text pages welcome

## 2019-01-31 ENCOUNTER — Other Ambulatory Visit (INDEPENDENT_AMBULATORY_CARE_PROVIDER_SITE_OTHER): Payer: PPO

## 2019-01-31 ENCOUNTER — Other Ambulatory Visit: Payer: Self-pay | Admitting: Family Medicine

## 2019-01-31 ENCOUNTER — Other Ambulatory Visit: Payer: Self-pay

## 2019-01-31 DIAGNOSIS — Z7901 Long term (current) use of anticoagulants: Secondary | ICD-10-CM

## 2019-01-31 DIAGNOSIS — D500 Iron deficiency anemia secondary to blood loss (chronic): Secondary | ICD-10-CM

## 2019-01-31 LAB — POCT HEMOGLOBIN: Hemoglobin: 9.5 g/dL — AB (ref 11–14.6)

## 2019-02-01 ENCOUNTER — Other Ambulatory Visit: Payer: Self-pay

## 2019-02-01 ENCOUNTER — Encounter: Payer: Self-pay | Admitting: Family Medicine

## 2019-02-01 ENCOUNTER — Ambulatory Visit (INDEPENDENT_AMBULATORY_CARE_PROVIDER_SITE_OTHER): Payer: PPO | Admitting: Family Medicine

## 2019-02-01 DIAGNOSIS — D500 Iron deficiency anemia secondary to blood loss (chronic): Secondary | ICD-10-CM

## 2019-02-01 DIAGNOSIS — R531 Weakness: Secondary | ICD-10-CM

## 2019-02-01 DIAGNOSIS — E039 Hypothyroidism, unspecified: Secondary | ICD-10-CM | POA: Diagnosis not present

## 2019-02-01 DIAGNOSIS — R0600 Dyspnea, unspecified: Secondary | ICD-10-CM | POA: Diagnosis not present

## 2019-02-01 DIAGNOSIS — I4811 Longstanding persistent atrial fibrillation: Secondary | ICD-10-CM | POA: Diagnosis not present

## 2019-02-01 LAB — HEMOGLOBIN AND HEMATOCRIT, BLOOD
Hematocrit: 30.7 % — ABNORMAL LOW (ref 34.0–46.6)
Hemoglobin: 9.9 g/dL — ABNORMAL LOW (ref 11.1–15.9)

## 2019-02-01 NOTE — Progress Notes (Signed)
Established Patient Office Visit  Subjective:  Patient ID: Jacqueline [name], female    DOB: 1931-01-09  Age: 85 y.o. MRN: [id number]  CC:  Chief Complaint  Patient presents with  . Hospitalization Follow-up    HPI Jacqueline [name] presents for hospital follow up for GI bleed and a fib. No further GI bleeding Seems to be in regular rhythm by her report.   Problem not addressed in the hospital: She has felt increasingly weak and short of breath for several months.  Last felt good in March.  Only focal sx is dyspnea.  Not at risk for COPD based on non smoker.  She is on amio so at risk for pulm fibrosis.  Recent echo OK.  No weight loss.  Denies CP, change in bowel, bladder or appetite.  Denies depression or anxiety  Past Medical History:  Diagnosis Date  . Arthritis    back  . Atrial fibrillation with rapid ventricular response ([place]) 02/2014   a. CHA2DS2VASc = 6 -> on eliquis;  b. 02/2014 s/p DCCV;  c. 08/2014 Echo: EF 55-60%, Gr 2 DD, mild MR, triv AI.  Marland Kitchen CAD (coronary artery disease)    a. s/p CABG x 2 (LIMA->LAD, VG->Diag);  b. 08/2014 Cath: LM nl, LAD 90p, LCX nl, RCA nl/dominant, LIMA->LAD atretic, VG->D2 patent w/ retrograde filling of LAD, EF 55-60%-->Med Rx.  . Diverticulitis 02/21/2012  . GERD (gastroesophageal reflux disease)   . Hyperlipemia   . Hypertension   . Insomnia     Past Surgical History:  Procedure Laterality Date  . ABDOMINAL HYSTERECTOMY  1975  . CARDIAC CATHETERIZATION N/A 05/09/2015   Procedure: Right Heart Cath;  Surgeon: Larey Dresser, MD;  Location: [facility];  Service: Cardiovascular;  Laterality: N/A;  . CARDIOVERSION N/A 02/28/2014   Procedure: CARDIOVERSION;  Surgeon: Sanda Klein, MD;  Location: [facility];  Service: Cardiovascular;  Laterality: N/A;  . CARDIOVERSION N/A 01/30/2019   Procedure: CARDIOVERSION;  Surgeon: Fay Records, MD;  Location: [facility] ENDOSCOPY;  Service: Cardiovascular;  Laterality: N/A;  . CATARACT EXTRACTION Bilateral  2014   with lens implanted  . COLONOSCOPY N/A 02/19/2015   Procedure: COLONOSCOPY;  Surgeon: Ladene Artist, MD;  Location: [facility] ENDOSCOPY;  Service: Endoscopy;  Laterality: N/A;  . CORONARY ARTERY BYPASS GRAFT  2006   off-pump bypass surgery with LIMA to the LAD and SVG to the second diagonal artery ( performed by Dr Servando Snare)   . JOINT REPLACEMENT Bilateral    L-2004, R-2006  . LEFT HEART CATHETERIZATION WITH CORONARY/GRAFT ANGIOGRAM N/A 09/17/2014   Procedure: LEFT HEART CATHETERIZATION WITH Beatrix Fetters;  Surgeon: Troy Sine, MD;  Location: [facility] CATH LAB;  Service: Cardiovascular;  Laterality: N/A;  . REIMPLANTATION OF TOTAL KNEE Right 10/08/2014   Procedure: REIMPLANTATION OF RIGHT TOTAL KNEE ARTHROPLASTY WITH REMOVAL OF ANTIBIOTIC SPACER;  Surgeon: Paralee Cancel, MD;  Location: [facility];  Service: Orthopedics;  Laterality: Right;  . ROTATOR CUFF REPAIR Bilateral    r-1999, l- 2005  . TEE WITHOUT CARDIOVERSION N/A 02/28/2014   Procedure: TRANSESOPHAGEAL ECHOCARDIOGRAM (TEE);  Surgeon: Sanda Klein, MD;  Location: [facility];  Service: Cardiovascular;  Laterality: N/A;  . TEE WITHOUT CARDIOVERSION N/A 01/30/2019   Procedure: TRANSESOPHAGEAL ECHOCARDIOGRAM (TEE);  Surgeon: Fay Records, MD;  Location: [facility];  Service: Cardiovascular;  Laterality: N/A;  . TOTAL KNEE ARTHROPLASTY Right 07/02/2014   Procedure: Resection of Infected Right Total Knee Arthroplasty with placement antibiotic spacer;  Surgeon: Mauri Pole, MD;  Location: [facility];  Service: Orthopedics;  Laterality: Right;    Family History  Problem Relation Age of Onset  . Parkinson's disease Mother   . Heart attack Brother   . Arthritis Brother   . Cancer Brother        Unknown  . Breast cancer Neg Hx     Social History   Socioeconomic History  . Marital status: Widowed    Spouse name: Not on file  . Number of children: 2  . Years of education: 10  . Highest education level: 10th grade  Occupational  History  . Not on file  Social Needs  . Financial resource strain: Not very hard  . Food insecurity    Worry: Never true    Inability: Never true  . Transportation needs    Medical: No    Non-medical: No  Tobacco Use  . Smoking status: Never Smoker  . Smokeless tobacco: Never Used  Substance and Sexual Activity  . Alcohol use: No  . Drug use: No  . Sexual activity: Not Currently    Birth control/protection: Post-menopausal  Lifestyle  . Physical activity    Days per week: 7 days    Minutes per session: 10 min  . Stress: Not at all  Relationships  . Social connections    Talks on phone: More than three times a week    Gets together: More than three times a week    Attends religious service: More than 4 times per year    Active member of club or organization: No    Attends meetings of clubs or organizations: Never    Relationship status: Widowed  . Intimate partner violence    Fear of current or ex partner: No    Emotionally abused: No    Physically abused: No    Forced sexual activity: No  Other Topics Concern  . 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 Drinks water and one cup of coffee daily.   Religious / Personal Beliefs: Baptist. "I believe Jesus died on the cross to save me [id number] my sins. I am saved by the grace of God."    Other: "I have a good life and am happy. My family is there when I need help."                                                               Outpatient Medications Prior to Visit   Medication Sig Dispense Refill  . acetaminophen (TYLENOL) 500 MG tablet Take 500 mg by mouth every 6 (six) hours as needed for moderate pain (pain/headache.).     Marland Kitchen ALPRAZolam (XANAX) 0.5 MG tablet TAKE 1 TABLET BY MOUTH TWICE A DAY AS NEEDED FOR ANXIETY (Patient taking differently: Take 0.5 mg  by mouth 2 (two) times daily as needed (anxiety). ) 60 tablet 5  . amiodarone (PACERONE) 100 MG tablet Take 2 tablets ( 200 mg ) daily (Patient taking differently: Take 200 mg by mouth daily. ) 180 tablet 3  . apixaban (ELIQUIS) 2.5 MG TABS tablet Take 1 tablet (2.5 mg total) by mouth 2 (two) times daily. 180 tablet 1  . gabapentin (NEURONTIN) 100 MG capsule TAKE 1 CAPSULE (100 MG TOTAL) BY MOUTH 3 (THREE) TIMES DAILY. 270 capsule 3  . Iron-Vitamins (GERITOL COMPLETE PO) Take 1 tablet by mouth daily.    . isosorbide mononitrate (IMDUR) 30 MG 24 hr tablet Take 1 tablet (30 mg total) by mouth daily. (Patient taking differently: Take 30 mg by mouth every evening. ) 90 tablet 3  . levothyroxine (SYNTHROID, LEVOTHROID) 75 MCG tablet Take 1 tablet (75 mcg total) by mouth daily. 90 tablet 3  . meclizine (ANTIVERT) 12.5 MG tablet Take 1 tablet (12.5 mg total) by mouth 3 (three) times daily as needed for dizziness. 30 tablet 2  . metoprolol succinate (TOPROL-XL) 25 MG 24 hr tablet Take 0.5 tablets (12.5 mg total) by mouth daily. 45 tablet 3  . nitroGLYCERIN (NITROSTAT) 0.4 MG SL tablet PLACE 1 TABLET UNDER THE TONGUE EVERY 5 MINUTES AS NEEDED FOR CHEST PAIN UP TO 3 DOSES (Patient taking differently: Place 0.4 mg under the tongue every 5 (five) minutes x 3 doses as needed for chest pain. PLACE 1 TABLET UNDER THE TONGUE EVERY 5 MINUTES AS NEEDED FOR CHEST PAIN UP TO 3 DOSES) 25 tablet 1  . Omega-3 Fatty Acids (FISH OIL) 1000 MG CAPS Take 1,000 mg by mouth 2 (two) times a day.     Vladimir Faster Glycol-Propyl Glycol (LUBRICANT EYE DROPS) 0.4-0.3 % SOLN Place 1-2 drops into both eyes 3 (three) times daily as needed  (dry/irritated eyes.).    Marland Kitchen polyethylene glycol powder (GLYCOLAX/MIRALAX) powder MIX 17 G AND TAKE BY MOUTH DAILY. (Patient taking differently: Take 17 g by mouth 3 (three) times a week. ) 527 g 12  . rOPINIRole (REQUIP) 0.25 MG tablet TAKE 1 TABLET BY MOUTH 3 TIMES A DAY (Patient taking differently: Take 0.25 mg by mouth 3 (three) times daily. ) 270 tablet 3  . simvastatin (ZOCOR) 20 MG tablet TAKE 1 TABLET BY MOUTH EVERYDAY AT BEDTIME (Patient taking differently: Take 20 mg by mouth at bedtime. ) 90 tablet 3  . traMADol (ULTRAM) 50 MG tablet TAKE 1 TABLET BY MOUTH TWICE A DAY AS NEEDED FOR PAIN (Patient taking differently: Take 50 mg by mouth every 12 (twelve) hours as needed (pain). ) 60 tablet 5  . traZODone (DESYREL) 100 MG tablet TAKE 1 TABLET (100 MG TOTAL) BY MOUTH AT BEDTIME. 90 tablet 3   No facility-administered medications prior to visit.     Allergies  Allergen Reactions  . Atorvastatin Other (See Comments)    Myalgias in legs  . Crestor [Rosuvastatin] Other (See Comments)    Myalgias in legs  . Morphine And Related Other (See Comments)    "Crazy thoughts, severe headache, sick"  . Prednisone     On two occasions has caused A fib    ROS Review of Systems    Objective:    Physical Exam  BP (!) 142/78   Pulse 68   SpO2 97%  Wt Readings from Last 3 Encounters:  01/30/19 132 lb 15 oz (60.3 kg)  01/17/19 132 lb (59.9 kg)  12/26/18 127 lb (57.6 kg)   Lungs  clear Cardiac RRR without m or g No edema  There are no preventive care reminders to display for this patient.  There are no preventive care reminders to display for this patient.  Lab Results  Component Value Date   TSH 2.375 01/28/2019   Lab Results  Component Value Date   WBC 4.4 01/30/2019   HGB 9.9 (L) 01/31/2019   HCT 30.7 (L) 01/31/2019   MCV 96.4 01/30/2019   PLT 146 (L) 01/30/2019   Lab Results  Component Value Date   NA 142 01/30/2019   K 4.2 01/30/2019   CO2 24 01/30/2019   GLUCOSE 99  01/30/2019   BUN 17 01/30/2019   CREATININE 1.72 (H) 01/30/2019   BILITOT 0.4 01/28/2019   ALKPHOS 52 01/28/2019   AST 28 01/28/2019   ALT 23 01/28/2019   PROT 6.2 (L) 01/28/2019   ALBUMIN 3.6 01/28/2019   CALCIUM 8.8 (L) 01/30/2019   ANIONGAP 6 01/30/2019   Lab Results  Component Value Date   CHOL 168 07/08/2018   Lab Results  Component Value Date   HDL 58 07/08/2018   Lab Results  Component Value Date   LDLCALC 84 07/08/2018   Lab Results  Component Value Date   TRIG 130 07/08/2018   Lab Results  Component Value Date   CHOLHDL 2.9 07/08/2018   Lab Results  Component Value Date   HGBA1C 5.8 (H) 07/23/2017      Assessment & Plan:   Problem List Items Addressed This Visit    Anemia due to chronic blood loss   Relevant Orders   CBC   Dyspnea    Dyspnea is only focal sx.  Will get PFTs to R/O amiodorone pulmonary fibrosis.      Weakness   Relevant Orders   Sedimentation Rate      No orders of the defined types were placed in this encounter.   Follow-up: Return in about 1 day (around 02/02/2019) for PFTs with Dr. Valentina Lucks.    [name], MD

## 2019-02-01 NOTE — Assessment & Plan Note (Signed)
Dyspnea is only focal sx.  Will get PFTs to R/O amiodorone pulmonary fibrosis.

## 2019-02-01 NOTE — Assessment & Plan Note (Addendum)
I am worried that she has an occult problem not yet defined.  For now, my focus is on 1. R/O worsening anemai. 2. R/O amiodorone induced pulmonary fibrosis 3. Check sed rate for PMR or Giant Cell arteritis. Of course, occult malignancy is also in the differential.  Will proceed with WU given she is not a complainer.  Called.  Surprisingly feels much better.  Also, sed rate is reassuring.  Will observe for now.

## 2019-02-01 NOTE — Assessment & Plan Note (Addendum)
DC hgb was 9.9.  No further bleeding per hx.  I don't think anemia explains her fatigue.  Hgb 9.3.  Told to take OTC iron once daily

## 2019-02-01 NOTE — Assessment & Plan Note (Signed)
Now in sinus by my exam.

## 2019-02-01 NOTE — Assessment & Plan Note (Signed)
Recent TSH OK.  Does not explain fatigue.

## 2019-02-01 NOTE — Patient Instructions (Signed)
Please get an appointment with Dr. Valentina Lucks soon for pulmonary function tests.   I will call Tuesday with lab results. Honestly, I do not know why you are feeling so poorly

## 2019-02-02 LAB — CBC
Hematocrit: 28.1 % — ABNORMAL LOW (ref 34.0–46.6)
Hemoglobin: 9.3 g/dL — ABNORMAL LOW (ref 11.1–15.9)
MCH: 31.4 pg (ref 26.6–33.0)
MCHC: 33.1 g/dL (ref 31.5–35.7)
MCV: 95 fL (ref 79–97)
Platelets: 139 10*3/uL — ABNORMAL LOW (ref 150–450)
RBC: 2.96 x10E6/uL — ABNORMAL LOW (ref 3.77–5.28)
RDW: 13.1 % (ref 11.7–15.4)
WBC: 5.8 10*3/uL (ref 3.4–10.8)

## 2019-02-02 LAB — SEDIMENTATION RATE: Sed Rate: 34 mm/hr (ref 0–40)

## 2019-02-07 MED ORDER — FERROUS SULFATE 325 (65 FE) MG PO TABS: 325.0000 mg | ORAL_TABLET | Freq: Every day | ORAL | 3 refills | Status: DC

## 2019-02-07 NOTE — Addendum Note (Signed)
Addended by: [name] on: 02/07/2019 11:56 AM   Modules accepted: Orders

## 2019-02-09 ENCOUNTER — Encounter: Payer: Self-pay | Admitting: Pharmacist

## 2019-02-09 ENCOUNTER — Other Ambulatory Visit: Payer: Self-pay

## 2019-02-09 ENCOUNTER — Ambulatory Visit (INDEPENDENT_AMBULATORY_CARE_PROVIDER_SITE_OTHER): Payer: PPO | Admitting: Pharmacist

## 2019-02-09 DIAGNOSIS — R0600 Dyspnea, unspecified: Secondary | ICD-10-CM | POA: Diagnosis not present

## 2019-02-09 NOTE — Patient Instructions (Addendum)
It was great to see you today, Jacqueline [name]!  Today, lung function testing was performed.   Your results were so well that no medications changes are recommended at this time.   Stay well and stay safe!

## 2019-02-09 NOTE — Progress Notes (Signed)
Patient ID: Jacqueline [name], female   DOB: 06-Oct-1930, 85 y.o.   MRN: [id number] Reviewed: I agree with Dr. Graylin Shiver documentation and management.

## 2019-02-09 NOTE — Assessment & Plan Note (Signed)
Patient has been experiencing dyspnea upon awakening for the past few weeks. History of Amiodarone use ~ 1 year at 100mg  dose and recent dose increase to 200mg  daily.  Spirometry reveals normal lung function. Patient instructed to contact Dr.Modell Fendrick or Dr. Andria Frames if dyspnea continues/worsens over the next few weeks.

## 2019-02-09 NOTE — Progress Notes (Signed)
   S:    Patient arrives in good mood with cane and limitation with walking from right knee. Presents for lung function evaluation.    Patient was referred and last seen by Primary Care Provider on 02/01/2019.   Patient reports restriction with breathing in the morning, but improves throughout the day.   Patient reports adherence to medications. Amiodarone was recently increased to 200 mg.  O: Physical Exam Constitutional:      Appearance: Normal appearance.  Neurological:     Mental Status: She is alert.  Psychiatric:        Mood and Affect: Mood normal.    Review of Systems  Musculoskeletal: Positive for joint pain.       Knee pain from previous surgery.    See "scanned report" or Documentation Flowsheet (discrete results - PFTs) for Spirometry results. Patient provided good effort while attempting spirometry.   A/P: Patient has been experiencing dyspnea upon awakening for the past few weeks. History of Amiodarone use ~ 1 year at 100mg  dose and recent dose increase to 200mg  daily.  Spirometry reveals normal lung function. Patient instructed to contact Dr.Koval or Dr. Andria Frames if dyspnea continues/worsens over the next few weeks.  Written pt instructions provided.  F/U Clinic visit with PCP, Dr. Andria Frames as needed.     Total time in face to face counseling 30 minutes.  Patient seen with Acie Fredrickson, PharmD Candidate and Drexel Iha, PGY2 Resident.

## 2019-02-16 ENCOUNTER — Telehealth: Payer: Self-pay | Admitting: Cardiology

## 2019-02-16 NOTE — Telephone Encounter (Signed)

## 2019-02-17 ENCOUNTER — Ambulatory Visit: Payer: PPO | Admitting: Cardiology

## 2019-02-17 ENCOUNTER — Other Ambulatory Visit: Payer: Self-pay

## 2019-02-17 VITALS — BP 149/80 | HR 64 | Temp 97.0°F | Ht 61.0 in | Wt 133.0 lb

## 2019-02-17 DIAGNOSIS — D3131 Benign neoplasm of right choroid: Secondary | ICD-10-CM | POA: Diagnosis not present

## 2019-02-17 DIAGNOSIS — Z951 Presence of aortocoronary bypass graft: Secondary | ICD-10-CM

## 2019-02-17 DIAGNOSIS — R5383 Other fatigue: Secondary | ICD-10-CM | POA: Diagnosis not present

## 2019-02-17 DIAGNOSIS — N183 Chronic kidney disease, stage 3 unspecified: Secondary | ICD-10-CM

## 2019-02-17 DIAGNOSIS — Z7901 Long term (current) use of anticoagulants: Secondary | ICD-10-CM | POA: Diagnosis not present

## 2019-02-17 DIAGNOSIS — K922 Gastrointestinal hemorrhage, unspecified: Secondary | ICD-10-CM

## 2019-02-17 DIAGNOSIS — H16223 Keratoconjunctivitis sicca, not specified as Sjogren's, bilateral: Secondary | ICD-10-CM | POA: Diagnosis not present

## 2019-02-17 DIAGNOSIS — N184 Chronic kidney disease, stage 4 (severe): Secondary | ICD-10-CM

## 2019-02-17 DIAGNOSIS — I480 Paroxysmal atrial fibrillation: Secondary | ICD-10-CM

## 2019-02-17 DIAGNOSIS — D649 Anemia, unspecified: Secondary | ICD-10-CM | POA: Diagnosis not present

## 2019-02-17 NOTE — Assessment & Plan Note (Signed)
Some rectal bleeding on admission 01/28/2019-felt to be secondary to internal hemorrhoids

## 2019-02-17 NOTE — Assessment & Plan Note (Signed)
CHADS VASC=6- on Eliquis low dose based on weight and CRi

## 2019-02-17 NOTE — Assessment & Plan Note (Signed)
GFR 26, SCr 1.7

## 2019-02-17 NOTE — Assessment & Plan Note (Signed)
Unclear etiology- ? Secondary to anemia, ? Secondary to bradycardia Check CBC and 7 day monitor

## 2019-02-17 NOTE — Assessment & Plan Note (Signed)
CABG x 2 2006- Cath in 2016 showed occluded LIMA to LAD with a patent SVG-Dx that filled the LAD. Myoview low risk Jan 2019

## 2019-02-17 NOTE — Assessment & Plan Note (Signed)
Check CBC, last Hgb 9.3 on 02/01/2019

## 2019-02-17 NOTE — Progress Notes (Signed)
Cardiology Office Note:    Date:  02/17/2019   ID:  Jacqueline [name], DOB 1930/12/04, MRN [id number]  PCP:  [name], MD  Cardiologist:  Dr [name] Electrophysiologist:  None   Referring MD: [name], MD   No chief complaint on file. F/U post TEE DCCV 01/30/2019  History of Present Illness:    Jacqueline [name] is a 85 y.o. female with a hx of coronary disease.  She had CABG in 2006.  Catheterization was done in 2016 and she was treated medically.  She had a low risk Myoview in January 2019.  She has had a history of PAF with tachybradycardia response.  She has been on low-dose Eliquis based on her age and chronic renal insufficiency.  The patient was eventually placed on amiodarone.  She held sinus rhythm but was bradycardic.  Amiodarone was ultimately decreased to 100 mg a day.  She then had recurrent AF with RVR.  Amiodarone was increased back to 200 mg daily 01/16/2019 and she was set up for OP DCCV.  Before this could be done she presented with rectal bleeding.  In the past work up showed internal hemorrhoids and diverticulosis.  Her anticoagulation had to be held.  She stabilized and ultimately underwent TEE DCCV on 01/30/2019 to NSR.  She is in the office today for follow up.   Since her cardioversion she says she has "felt terrible". She complains of weakness.  No syncope.  She had this pre cardioversion and we attributed it to AF with RVR.  She saw Dr Andria Frames 02/01/2019 and f/u CBC and sed rate were ordered. Hgb was 9.3, sed rate 34 (WNL).  PO Iron was added.  She asked if she didn't need a pacemaker, she has had documented sinus bradycardia when in NSR and this has been mentioned in the past.  Her HR today is 61.   Past Medical History:  Diagnosis Date  . Arthritis    back  . Atrial fibrillation with rapid ventricular response ([place]) 02/2014   a. CHA2DS2VASc = 6 -> on eliquis;  b. 02/2014 s/p DCCV;  c. 08/2014 Echo: EF 55-60%, Gr 2 DD, mild MR, triv AI.  Marland Kitchen CAD (coronary artery  disease)    a. s/p CABG x 2 (LIMA->LAD, VG->Diag);  b. 08/2014 Cath: LM nl, LAD 90p, LCX nl, RCA nl/dominant, LIMA->LAD atretic, VG->D2 patent w/ retrograde filling of LAD, EF 55-60%-->Med Rx.  . Diverticulitis 02/21/2012  . GERD (gastroesophageal reflux disease)   . Hyperlipemia   . Hypertension   . Insomnia     Past Surgical History:  Procedure Laterality Date  . ABDOMINAL HYSTERECTOMY  1975  . CARDIAC CATHETERIZATION N/A 05/09/2015   Procedure: Right Heart Cath;  Surgeon: Larey Dresser, MD;  Location: [facility];  Service: Cardiovascular;  Laterality: N/A;  . CARDIOVERSION N/A 02/28/2014   Procedure: CARDIOVERSION;  Surgeon: Sanda Klein, MD;  Location: [facility];  Service: Cardiovascular;  Laterality: N/A;  . CARDIOVERSION N/A 01/30/2019   Procedure: CARDIOVERSION;  Surgeon: Fay Records, MD;  Location: [facility] ENDOSCOPY;  Service: Cardiovascular;  Laterality: N/A;  . CATARACT EXTRACTION Bilateral 2014   with lens implanted  . COLONOSCOPY N/A 02/19/2015   Procedure: COLONOSCOPY;  Surgeon: Ladene Artist, MD;  Location: [facility] ENDOSCOPY;  Service: Endoscopy;  Laterality: N/A;  . CORONARY ARTERY BYPASS GRAFT  2006   off-pump bypass surgery with LIMA to the LAD and SVG to the second diagonal artery ( performed by Dr Servando Snare)   .  JOINT REPLACEMENT Bilateral    L-2004, R-2006  . LEFT HEART CATHETERIZATION WITH CORONARY/GRAFT ANGIOGRAM N/A 09/17/2014   Procedure: LEFT HEART CATHETERIZATION WITH Beatrix Fetters;  Surgeon: Troy Sine, MD;  Location: [facility] CATH LAB;  Service: Cardiovascular;  Laterality: N/A;  . REIMPLANTATION OF TOTAL KNEE Right 10/08/2014   Procedure: REIMPLANTATION OF RIGHT TOTAL KNEE ARTHROPLASTY WITH REMOVAL OF ANTIBIOTIC SPACER;  Surgeon: Paralee Cancel, MD;  Location: [facility];  Service: Orthopedics;  Laterality: Right;  . ROTATOR CUFF REPAIR Bilateral    r-1999, l- 2005  . TEE WITHOUT CARDIOVERSION N/A 02/28/2014   Procedure: TRANSESOPHAGEAL ECHOCARDIOGRAM  (TEE);  Surgeon: Sanda Klein, MD;  Location: [facility];  Service: Cardiovascular;  Laterality: N/A;  . TEE WITHOUT CARDIOVERSION N/A 01/30/2019   Procedure: TRANSESOPHAGEAL ECHOCARDIOGRAM (TEE);  Surgeon: Fay Records, MD;  Location: [place];  Service: Cardiovascular;  Laterality: N/A;  . TOTAL KNEE ARTHROPLASTY Right 07/02/2014   Procedure: Resection of Infected Right Total Knee Arthroplasty with placement antibiotic spacer;  Surgeon: Mauri Pole, MD;  Location: [facility];  Service: Orthopedics;  Laterality: Right;    Current Medications: Current Meds  Medication Sig  . acetaminophen (TYLENOL) 500 MG tablet Take 500 mg by mouth every 6 (six) hours as needed for moderate pain (pain/headache.).   Marland Kitchen ALPRAZolam (XANAX) 0.5 MG tablet TAKE 1 TABLET BY MOUTH TWICE A DAY AS NEEDED FOR ANXIETY (Patient taking differently: Take 0.5 mg by mouth 2 (two) times daily as needed (anxiety). )  . amiodarone (PACERONE) 100 MG tablet Take 2 tablets ( 200 mg ) daily (Patient taking differently: Take 200 mg by mouth daily. )  . apixaban (ELIQUIS) 2.5 MG TABS tablet Take 1 tablet (2.5 mg total) by mouth 2 (two) times daily.  . ferrous sulfate (FERROUSUL) 325 (65 FE) MG tablet Take 1 tablet (325 mg total) by mouth daily with breakfast.  . gabapentin (NEURONTIN) 100 MG capsule TAKE 1 CAPSULE (100 MG TOTAL) BY MOUTH 3 (THREE) TIMES DAILY.  Marland Kitchen Iron-Vitamins (GERITOL COMPLETE PO) Take 1 tablet by mouth daily.  . isosorbide mononitrate (IMDUR) 30 MG 24 hr tablet Take 1 tablet (30 mg total) by mouth daily. (Patient taking differently: Take 30 mg by mouth every evening. )  . levothyroxine (SYNTHROID, LEVOTHROID) 75 MCG tablet Take 1 tablet (75 mcg total) by mouth daily.  . meclizine (ANTIVERT) 12.5 MG tablet Take 1 tablet (12.5 mg total) by mouth 3 (three) times daily as needed for dizziness.  . nitroGLYCERIN (NITROSTAT) 0.4 MG SL tablet PLACE 1 TABLET UNDER THE TONGUE EVERY 5 MINUTES AS NEEDED FOR CHEST PAIN UP TO 3  DOSES (Patient taking differently: Place 0.4 mg under the tongue every 5 (five) minutes x 3 doses as needed for chest pain. PLACE 1 TABLET UNDER THE TONGUE EVERY 5 MINUTES AS NEEDED FOR CHEST PAIN UP TO 3 DOSES)  . Omega-3 Fatty Acids (FISH OIL) 1000 MG CAPS Take 1,000 mg by mouth 2 (two) times a day.   Marland Kitchen omeprazole (PRILOSEC) 20 MG capsule Take 20 mg by mouth daily.  Vladimir Faster Glycol-Propyl Glycol (LUBRICANT EYE DROPS) 0.4-0.3 % SOLN Place 1-2 drops into both eyes 3 (three) times daily as needed (dry/irritated eyes.).  Marland Kitchen polyethylene glycol powder (GLYCOLAX/MIRALAX) powder MIX 17 G AND TAKE BY MOUTH DAILY. (Patient taking differently: Take 17 g by mouth 3 (three) times a week. )  . rOPINIRole (REQUIP) 0.25 MG tablet TAKE 1 TABLET BY MOUTH 3 TIMES A DAY (Patient taking differently: Take 0.25 mg by  mouth 3 (three) times daily. )  . simvastatin (ZOCOR) 20 MG tablet TAKE 1 TABLET BY MOUTH EVERYDAY AT BEDTIME (Patient taking differently: Take 20 mg by mouth at bedtime. )  . traMADol (ULTRAM) 50 MG tablet TAKE 1 TABLET BY MOUTH TWICE A DAY AS NEEDED FOR PAIN (Patient taking differently: Take 50 mg by mouth every 12 (twelve) hours as needed (pain). )  . traZODone (DESYREL) 100 MG tablet TAKE 1 TABLET (100 MG TOTAL) BY MOUTH AT BEDTIME.     Allergies:   Atorvastatin, Crestor [rosuvastatin], Morphine and related, and Prednisone   Social History   Socioeconomic History  . Marital status: Widowed    Spouse name: Not on file  . Number of children: 2  . Years of education: 10  . Highest education level: 10th grade  Occupational History  . Not on file  Social Needs  . Financial resource strain: Not very hard  . Food insecurity    Worry: Never true    Inability: Never true  . Transportation needs    Medical: No    Non-medical: No  Tobacco Use  . Smoking status: Never Smoker  . Smokeless tobacco: Never Used  Substance and Sexual Activity  . Alcohol use: No  . Drug use: No  . Sexual activity:  Not Currently    Birth control/protection: Post-menopausal  Lifestyle  . Physical activity    Days per week: 7 days    Minutes per session: 10 min  . Stress: Not at all  Relationships  . Social connections    Talks on phone: More than three times a week    Gets together: More than three times a week    Attends religious service: More than 4 times per year    Active member of club or organization: No    Attends meetings of clubs or organizations: Never    Relationship status: Widowed  Other Topics Concern  . 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 Drinks water and one cup of coffee daily.   Religious / Personal Beliefs: Baptist. "I believe Jesus died on the cross to save me [id number] my sins. I am saved by the grace of God."    Other: "I have a good life and am happy. My family is there when I need help."                                                                Family History: The patient's family history includes Arthritis in her brother; Cancer in her brother; Heart attack in her brother; Parkinson's disease in her mother. There is no history of Breast cancer.  ROS:   Please see the history of present illness.     All other systems reviewed and  are negative.  EKGs/Labs/Other Studies Reviewed:    The following studies were reviewed today:   EKG:  EKG is ordered today.  The EKG ordered today demonstrates NSR, 61  Recent Labs: 01/28/2019: ALT 23; TSH 2.375 01/30/2019: BUN 17; Creatinine, Ser 1.72; Potassium 4.2;  Sodium 142 02/01/2019: Hemoglobin 9.3; Platelets 139  Recent Lipid Panel    Component Value Date/Time   CHOL 168 07/08/2018 1518   TRIG 130 07/08/2018 1518   HDL 58 07/08/2018 1518   CHOLHDL 2.9 07/08/2018 1518   CHOLHDL 2.8 04/08/2016 1153   VLDL 26 04/08/2016 1153   LDLCALC 84 07/08/2018 1518    Physical Exam:    VS:  BP (!) 149/80   Pulse 64   Temp (!) 97 F (36.1 C)   Ht 5\' 1"  (1.549 m)   Wt 133 lb (60.3 kg)   SpO2 94%   BMI 25.13 kg/m     Wt Readings from Last 3 Encounters:  02/17/19 133 lb (60.3 kg)  02/09/19 132 lb (59.9 kg)  01/30/19 132 lb 15 oz (60.3 kg)     GEN: Elderly, frail female, in no acute distress HEENT: Normal NECK: No JVD; No carotid bruits LYMPHATICS: No lymphadenopathy CARDIAC: RRR, no murmurs, rubs, gallops RESPIRATORY:  Clear to auscultation without rales, wheezing or rhonchi  ABDOMEN: Soft, non-tender, non-distended MUSCULOSKELETAL:  No edema; No deformity  SKIN: Warm and dry NEUROLOGIC:  Alert and oriented x 3 PSYCHIATRIC:  Normal affect   ASSESSMENT:    Paroxysmal atrial fibrillation (HCC) Recurrent PAF Dec 2018-placed on Amiodarone. Amiodarone decreased (bradycardia and fatigue) but she had recurrent AF June 2020. Amiodarone increased back to 200 mg daily and patient had CV 01/30/2019  Fatigue Unclear etiology- ? Secondary to anemia, ? Secondary to bradycardia Check CBC and 7 day monitor  Long term (current) use of anticoagulants CHADS VASC=6- on Eliquis low dose based on weight and CRi  Lower GI bleed Some rectal bleeding on admission 01/28/2019-felt to be secondary to internal hemorrhoids  Anemia Check CBC, last Hgb 9.3 on 02/01/2019  Hx of CABG CABG x 2 2006- Cath in 2016 showed occluded LIMA to LAD with a patent SVG-Dx that filled the LAD. Myoview low risk Jan 2019  CKD (chronic kidney disease) stage 4, GFR 15-29 ml/min (HCC) GFR 26, SCr 1.7  PLAN:    Check CBC, BMP, 7 day monitor.  F/U with Dr [name].    Medication Adjustments/Labs and Tests Ordered: Current medicines are reviewed at length with the patient today.  Concerns regarding medicines are outlined above.  Orders Placed This Encounter  Procedures  . Basic metabolic panel  . CBC  . LONG TERM MONITOR (3-14 DAYS)  . EKG 12-Lead   No orders of the defined types were placed in this encounter.   Patient Instructions  Medication Instructions:  Your physician recommends that you continue on your current medications as directed. Please refer to the Current Medication list given to you today.  If you need a refill on your cardiac medications before your next appointment, please call your pharmacy.   Lab work: You will need to have labs (blood work) drawn today:  BMET  CBC  If you have labs (blood work) drawn today and your tests are completely normal, you will receive your results only by: Marland Kitchen MyChart Message (if you have MyChart) OR . A paper copy in the mail If you have any lab test that is abnormal or we need to change your treatment, we will call you  to review the results.  Testing/Procedures: Your physician has recommended that you wear a 7 DAY ZIO-PATCH monitor. The Zio patch cardiac monitor continuously records heart rhythm data for up to 14 days, this is for patients being evaluated for multiple types heart rhythms. For the first 24 hours post application, please avoid getting the Zio monitor wet in the shower or by excessive sweating during exercise. After that, feel free to carry on with regular activities. Keep soaps and lotions away from the ZIO XT Patch.  This will be placed at our [facility] location - [address].         Follow-Up: At [facility], you and your health needs are our priority.  As part of our continuing mission to provide you with exceptional heart care, we have created designated Provider Care Teams.  These Care Teams include your primary Cardiologist (physician) and Advanced  Practice Providers (APPs -  Physician Assistants and Nurse Practitioners) who all work together to provide you with the care you need, when you need it. Dennis Bast will hear from Kerin Ransom, PA-C about your lab results and monitor.     Any Other Special Instructions Will Be Listed Below (If Applicable).       Signed, Kerin Ransom, PA-C  02/17/2019 1:59 PM     [facility]

## 2019-02-17 NOTE — Patient Instructions (Addendum)
Medication Instructions:  Your physician recommends that you continue on your current medications as directed. Please refer to the Current Medication list given to you today.  If you need a refill on your cardiac medications before your next appointment, please call your pharmacy.   Lab work: You will need to have labs (blood work) drawn today:  BMET  CBC  If you have labs (blood work) drawn today and your tests are completely normal, you will receive your results only by: Marland Kitchen MyChart Message (if you have MyChart) OR . A paper copy in the mail If you have any lab test that is abnormal or we need to change your treatment, we will call you to review the results.  Testing/Procedures: Your physician has recommended that you wear a 7 DAY ZIO-PATCH monitor. The Zio patch cardiac monitor continuously records heart rhythm data for up to 14 days, this is for patients being evaluated for multiple types heart rhythms. For the first 24 hours post application, please avoid getting the Zio monitor wet in the shower or by excessive sweating during exercise. After that, feel free to carry on with regular activities. Keep soaps and lotions away from the ZIO XT Patch.  This will be placed at our [facility] location - [address].         Follow-Up: At [facility], you and your health needs are our priority.  As part of our continuing mission to provide you with exceptional heart care, we have created designated Provider Care Teams.  These Care Teams include your primary Cardiologist (physician) and Advanced Practice Providers (APPs -  Physician Assistants and Nurse Practitioners) who all work together to provide you with the care you need, when you need it. Dennis Bast will hear from Kerin Ransom, PA-C about your lab results and monitor.     Any Other Special Instructions Will Be Listed Below (If Applicable).

## 2019-02-17 NOTE — Assessment & Plan Note (Signed)
Recurrent PAF Dec 2018-placed on Amiodarone. Amiodarone decreased (bradycardia and fatigue) but she had recurrent AF June 2020. Amiodarone increased back to 200 mg daily and patient had CV 01/30/2019

## 2019-02-18 LAB — CBC
Hematocrit: 30.8 % — ABNORMAL LOW (ref 34.0–46.6)
Hemoglobin: 10.5 g/dL — ABNORMAL LOW (ref 11.1–15.9)
MCH: 32.8 pg (ref 26.6–33.0)
MCHC: 34.1 g/dL (ref 31.5–35.7)
MCV: 96 fL (ref 79–97)
Platelets: 216 10*3/uL (ref 150–450)
RBC: 3.2 x10E6/uL — ABNORMAL LOW (ref 3.77–5.28)
RDW: 13.3 % (ref 11.7–15.4)
WBC: 4.9 10*3/uL (ref 3.4–10.8)

## 2019-02-18 LAB — BASIC METABOLIC PANEL
BUN/Creatinine Ratio: 13 (ref 12–28)
BUN: 24 mg/dL (ref 8–27)
CO2: 23 mmol/L (ref 20–29)
Calcium: 9.8 mg/dL (ref 8.7–10.3)
Chloride: 100 mmol/L (ref 96–106)
Creatinine, Ser: 1.83 mg/dL — ABNORMAL HIGH (ref 0.57–1.00)
GFR calc Af Amer: 28 mL/min/{1.73_m2} — ABNORMAL LOW (ref >59)
GFR calc non Af Amer: 25 mL/min/{1.73_m2} — ABNORMAL LOW (ref >59)
Glucose: 89 mg/dL (ref 65–99)
Potassium: 5.3 mmol/L — ABNORMAL HIGH (ref 3.5–5.2)
Sodium: 138 mmol/L (ref 134–144)

## 2019-02-20 ENCOUNTER — Other Ambulatory Visit: Payer: Self-pay | Admitting: Family Medicine

## 2019-02-20 ENCOUNTER — Telehealth: Payer: Self-pay | Admitting: Family Medicine

## 2019-02-20 ENCOUNTER — Telehealth: Payer: Self-pay | Admitting: *Deleted

## 2019-02-20 DIAGNOSIS — G609 Hereditary and idiopathic neuropathy, unspecified: Secondary | ICD-10-CM

## 2019-02-20 DIAGNOSIS — I480 Paroxysmal atrial fibrillation: Secondary | ICD-10-CM

## 2019-02-20 DIAGNOSIS — D500 Iron deficiency anemia secondary to blood loss (chronic): Secondary | ICD-10-CM

## 2019-02-20 IMAGING — DX DG CHEST 1V PORT
1 series · 1 of 1 positions shown · non-contrast
Comparison: 08/14/2015

CLINICAL DATA: Tachycardia

EXAM:
PORTABLE CHEST 1 VIEW

[chest ap]
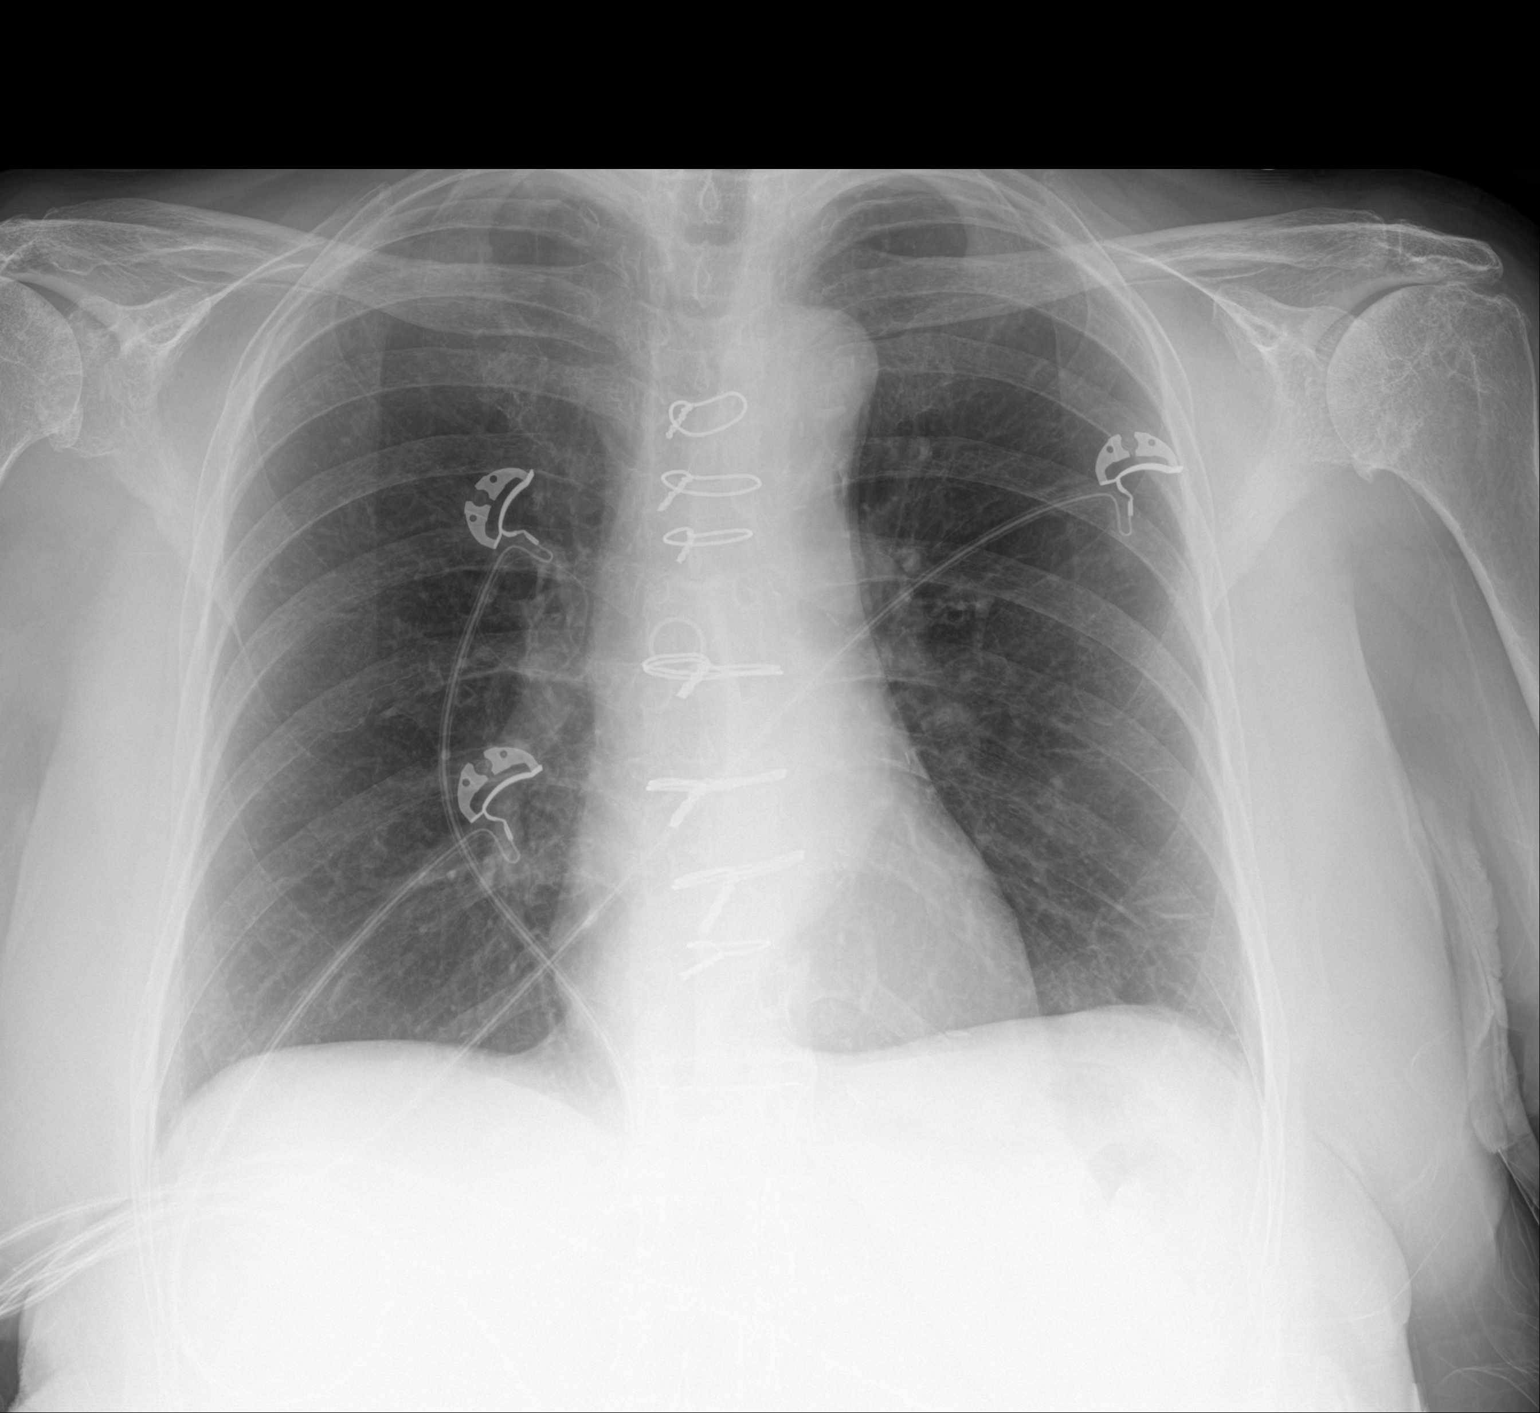

[1 of 1 positions shown; findings below may reference images not displayed]

FINDINGS: Normal heart size and stable mediastinal contours. Status post CABG.
No acute infiltrate or edema. No effusion or pneumothorax. Bilateral
glenohumeral osteoarthritis. No acute osseous findings.
IMPRESSION: No evidence for active disease.

## 2019-02-20 MED ORDER — POLYSACCHARIDE IRON COMPLEX 150 MG PO CAPS: 150.0000 mg | ORAL_CAPSULE | Freq: Every day | ORAL | 3 refills | Status: DC

## 2019-02-20 MED ORDER — POLYETHYLENE GLYCOL 3350 17 GM/SCOOP PO POWD: ORAL | 12 refills | Status: DC

## 2019-02-20 NOTE — Telephone Encounter (Signed)
Patient made aware of results and verbalized understanding.  

## 2019-02-20 NOTE — Telephone Encounter (Signed)
-----   Message from Sanda Klein, MD sent at 02/19/2019  2:40 PM EDT ----- Although still mildly anemic, her hemoglobin is better and unlikely to to be the cause of her fatigue. Kidney function is at baseline (1.6-1.9 over last 6 months). K is a little high, but she is not on K supplements or medications that increase K so I think it is most likely artifactual (hemolysis). Let's try stopping her metoprolol completely. Will review the rhythm monitor when ready to see if a pacemaker is the solution.

## 2019-02-20 NOTE — Telephone Encounter (Signed)
Called.  DCed metoprolol per cards note.   Also, ferrous sulfate causing constipation.  Switched iron type and added miralax.

## 2019-02-20 NOTE — Assessment & Plan Note (Signed)
Ferrous sulfate is causing constipation.  Switch to iron polysaccaride and add miralax

## 2019-02-20 NOTE — Assessment & Plan Note (Signed)
Per cards note, DC metoprolol

## 2019-02-21 ENCOUNTER — Telehealth: Payer: Self-pay | Admitting: Radiology

## 2019-02-21 NOTE — Telephone Encounter (Signed)
Enrolled patient for a 7 day Zio monitor to be mailed. Brief instructions were gone over with patient and she knows to expect the monitor to arrive in 3-4 days

## 2019-02-25 ENCOUNTER — Other Ambulatory Visit (INDEPENDENT_AMBULATORY_CARE_PROVIDER_SITE_OTHER): Payer: PPO

## 2019-02-25 DIAGNOSIS — R5383 Other fatigue: Secondary | ICD-10-CM

## 2019-02-25 DIAGNOSIS — I480 Paroxysmal atrial fibrillation: Secondary | ICD-10-CM

## 2019-03-01 DIAGNOSIS — M25561 Pain in right knee: Secondary | ICD-10-CM | POA: Diagnosis not present

## 2019-03-01 DIAGNOSIS — M25562 Pain in left knee: Secondary | ICD-10-CM | POA: Diagnosis not present

## 2019-03-09 ENCOUNTER — Ambulatory Visit (INDEPENDENT_AMBULATORY_CARE_PROVIDER_SITE_OTHER): Payer: PPO | Admitting: Family Medicine

## 2019-03-09 ENCOUNTER — Other Ambulatory Visit: Payer: Self-pay

## 2019-03-09 ENCOUNTER — Encounter: Payer: Self-pay | Admitting: Family Medicine

## 2019-03-09 DIAGNOSIS — I5032 Chronic diastolic (congestive) heart failure: Secondary | ICD-10-CM

## 2019-03-09 DIAGNOSIS — N184 Chronic kidney disease, stage 4 (severe): Secondary | ICD-10-CM | POA: Diagnosis not present

## 2019-03-09 DIAGNOSIS — I2721 Secondary pulmonary arterial hypertension: Secondary | ICD-10-CM

## 2019-03-09 DIAGNOSIS — I4811 Longstanding persistent atrial fibrillation: Secondary | ICD-10-CM

## 2019-03-09 DIAGNOSIS — Z0000 Encounter for general adult medical examination without abnormal findings: Secondary | ICD-10-CM | POA: Diagnosis not present

## 2019-03-09 DIAGNOSIS — D492 Neoplasm of unspecified behavior of bone, soft tissue, and skin: Secondary | ICD-10-CM | POA: Insufficient documentation

## 2019-03-09 DIAGNOSIS — I480 Paroxysmal atrial fibrillation: Secondary | ICD-10-CM

## 2019-03-09 MED ORDER — AMIODARONE HCL 100 MG PO TABS: ORAL_TABLET | ORAL | 3 refills | Status: DC

## 2019-03-09 NOTE — Assessment & Plan Note (Signed)
Medical problems stable.  Screening and immunizations are up to date.

## 2019-03-09 NOTE — Patient Instructions (Addendum)
Thank you for coming in for an annual physical/wellness exam. You are up to date on all your testing.   You will need a flu shot this fall.  If you get it somewhere other than my office, please make sure to call us and let know. Of course, when the COVID vaccine comes out, I will almost certainly recommend it.   There is a new shingles vaccine that I recommend you take.  Since you wanted to think about it, call me if/when you are ready.  It is easy for me to send in a prescription.   You do not need any additional blood work today. You do need to come back at your convenience for me to remove the skin spot under your right breast.  I have a mild concern that it might be a skin cancer.   Your weight is stable now.  Please let me know if your start losing weight.

## 2019-03-09 NOTE — Assessment & Plan Note (Addendum)
Pigmented skin neoplasm.  Seems benign except nodular.  Will excise to Rule out melanoma.  Stop eliquis two days prior to skin surgery.

## 2019-03-09 NOTE — Assessment & Plan Note (Signed)
At her dry wt.  Continue on current meds.

## 2019-03-09 NOTE — Assessment & Plan Note (Signed)
Stable on current meds.  Loop recorder per cards.

## 2019-03-09 NOTE — Assessment & Plan Note (Signed)
Recent creat at baseline.

## 2019-03-09 NOTE — Assessment & Plan Note (Signed)
Symptomatically stable.

## 2019-03-09 NOTE — Progress Notes (Signed)
Established Patient Office Visit  Subjective:  Patient ID: Jacqueline [name], female    DOB: 04/12/1931  Age: 85 y.o. MRN: [id number]  CC:  Chief Complaint  Patient presents with  . Annual Exam    HPI Jacqueline [name] presents for Annual exam.  Patient has had several problem oriented visits.  Currently her problems are stable.  She comes in today for an annual physical to update her health maintanence issues.  Issues: A fib: being seen by cardiology.  Currently has a loop recorder.  On amiodorone and apixiban.  Needs amiodorone refill. Hypothyroid.  On replacement.  TSH normal on 10/01/1694 Chronic diastolic CHF, stable.  No DOE - except that which is expected for age 82. CKD Stable.  Recent BMP shows creat at basline. Pulmonary artery hypertension.  Denies leg swelling SOB or orthopnea. Has a firm skin lesion under her right breast she wants me to check. HPDP- not much screening recommended at age 10.  Up to date on immunizations.    Past Medical History:  Diagnosis Date  . Arthritis    back  . Atrial fibrillation with rapid ventricular response ([place]) 02/2014   a. CHA2DS2VASc = 6 -> on eliquis;  b. 02/2014 s/p DCCV;  c. 08/2014 Echo: EF 55-60%, Gr 2 DD, mild MR, triv AI.  Marland Kitchen CAD (coronary artery disease)    a. s/p CABG x 2 (LIMA->LAD, VG->Diag);  b. 08/2014 Cath: LM nl, LAD 90p, LCX nl, RCA nl/dominant, LIMA->LAD atretic, VG->D2 patent w/ retrograde filling of LAD, EF 55-60%-->Med Rx.  . Diverticulitis 02/21/2012  . GERD (gastroesophageal reflux disease)   . Hyperlipemia   . Hypertension   . Insomnia     Past Surgical History:  Procedure Laterality Date  . ABDOMINAL HYSTERECTOMY  1975  . CARDIAC CATHETERIZATION N/A 05/09/2015   Procedure: Right Heart Cath;  Surgeon: Larey Dresser, MD;  Location: [place] [facility];  Service: Cardiovascular;  Laterality: N/A;  . CARDIOVERSION N/A 02/28/2014   Procedure: CARDIOVERSION;  Surgeon: Sanda Klein, MD;  Location: [place] ENDOSCOPY;  Service:  Cardiovascular;  Laterality: N/A;  . CARDIOVERSION N/A 01/30/2019   Procedure: CARDIOVERSION;  Surgeon: Fay Records, MD;  Location: [facility] ENDOSCOPY;  Service: Cardiovascular;  Laterality: N/A;  . CATARACT EXTRACTION Bilateral 2014   with lens implanted  . COLONOSCOPY N/A 02/19/2015   Procedure: COLONOSCOPY;  Surgeon: Ladene Artist, MD;  Location: [facility] ENDOSCOPY;  Service: Endoscopy;  Laterality: N/A;  . CORONARY ARTERY BYPASS GRAFT  2006   off-pump bypass surgery with LIMA to the LAD and SVG to the second diagonal artery ( performed by Dr Servando Snare)   . JOINT REPLACEMENT Bilateral    L-2004, R-2006  . LEFT HEART CATHETERIZATION WITH CORONARY/GRAFT ANGIOGRAM N/A 09/17/2014   Procedure: LEFT HEART CATHETERIZATION WITH Beatrix Fetters;  Surgeon: Troy Sine, MD;  Location: [facility] CATH LAB;  Service: Cardiovascular;  Laterality: N/A;  . REIMPLANTATION OF TOTAL KNEE Right 10/08/2014   Procedure: REIMPLANTATION OF RIGHT TOTAL KNEE ARTHROPLASTY WITH REMOVAL OF ANTIBIOTIC SPACER;  Surgeon: Paralee Cancel, MD;  Location: [facility];  Service: Orthopedics;  Laterality: Right;  . ROTATOR CUFF REPAIR Bilateral    r-1999, l- 2005  . TEE WITHOUT CARDIOVERSION N/A 02/28/2014   Procedure: TRANSESOPHAGEAL ECHOCARDIOGRAM (TEE);  Surgeon: Sanda Klein, MD;  Location: [place];  Service: Cardiovascular;  Laterality: N/A;  . TEE WITHOUT CARDIOVERSION N/A 01/30/2019   Procedure: TRANSESOPHAGEAL ECHOCARDIOGRAM (TEE);  Surgeon: Fay Records, MD;  Location: [place];  Service:  Cardiovascular;  Laterality: N/A;  . TOTAL KNEE ARTHROPLASTY Right 07/02/2014   Procedure: Resection of Infected Right Total Knee Arthroplasty with placement antibiotic spacer;  Surgeon: Mauri Pole, MD;  Location: [facility];  Service: Orthopedics;  Laterality: Right;    Family History  Problem Relation Age of Onset  . Parkinson's disease Mother   . Heart attack Brother   . Arthritis Brother   . Cancer Brother        Unknown  . Breast  cancer Neg Hx     Social History   Socioeconomic History  . Marital status: Widowed    Spouse name: Not on file  . Number of children: 2  . Years of education: 10  . Highest education level: 10th grade  Occupational History  . Not on file  Social Needs  . Financial resource strain: Not very hard  . Food insecurity    Worry: Never true    Inability: Never true  . Transportation needs    Medical: No    Non-medical: No  Tobacco Use  . Smoking status: Never Smoker  . Smokeless tobacco: Never Used  Substance and Sexual Activity  . Alcohol use: No  . Drug use: No  . Sexual activity: Not Currently    Birth control/protection: Post-menopausal  Lifestyle  . Physical activity    Days per week: 7 days    Minutes per session: 10 min  . Stress: Not at all  Relationships  . Social connections    Talks on phone: More than three times a week    Gets together: More than three times a week    Attends religious service: More than 4 times per year    Active member of club or organization: No    Attends meetings of clubs or organizations: Never    Relationship status: Widowed  . Intimate partner violence    Fear of current or ex partner: No    Emotionally abused: No    Physically abused: No    Forced sexual activity: No  Other Topics Concern  . 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 Drinks water and one cup of coffee daily.   Religious / Personal Beliefs: Baptist. "I believe Jesus died on the cross to save me from my  sins. I am saved by the grace of God."    Other: "I have a good life and am happy. My family is there when I need help."                                                               Outpatient Medications Prior to Visit  Medication Sig Dispense Refill  . acetaminophen (TYLENOL) 500 MG tablet Take 500 mg by mouth every 6 (six) hours as needed  for moderate pain (pain/headache.).     Marland Kitchen ALPRAZolam (XANAX) 0.5 MG tablet TAKE 1 TABLET BY MOUTH TWICE A DAY AS NEEDED FOR ANXIETY (Patient taking differently: Take 0.5 mg by mouth 2 (two) times daily as needed (anxiety). ) 60 tablet 5  . apixaban (ELIQUIS) 2.5 MG TABS tablet Take 1 tablet (2.5 mg total) by mouth 2 (two) times daily. 180 tablet 1  . gabapentin (NEURONTIN) 100 MG capsule TAKE 1 CAPSULE (100 MG TOTAL) BY MOUTH 3 (THREE) TIMES DAILY. 270 capsule 3  . iron polysaccharides (NIFEREX) 150 MG capsule Take 1 capsule (150 mg total) by mouth daily. 100 capsule 3  . Iron-Vitamins (GERITOL COMPLETE PO) Take 1 tablet by mouth daily.    . isosorbide mononitrate (IMDUR) 30 MG 24 hr tablet Take 1 tablet (30 mg total) by mouth daily. (Patient taking differently: Take 30 mg by mouth every evening. ) 90 tablet 3  . levothyroxine (SYNTHROID, LEVOTHROID) 75 MCG tablet Take 1 tablet (75 mcg total) by mouth daily. 90 tablet 3  . meclizine (ANTIVERT) 12.5 MG tablet Take 1 tablet (12.5 mg total) by mouth 3 (three) times daily as needed for dizziness. 30 tablet 2  . nitroGLYCERIN (NITROSTAT) 0.4 MG SL tablet PLACE 1 TABLET UNDER THE TONGUE EVERY 5 MINUTES AS NEEDED FOR CHEST PAIN UP TO 3 DOSES (Patient taking differently: Place 0.4 mg under the tongue every 5 (five) minutes x 3 doses as needed for chest pain. PLACE 1 TABLET UNDER THE TONGUE EVERY 5 MINUTES AS NEEDED FOR CHEST PAIN UP TO 3 DOSES) 25 tablet 1  . Omega-3 Fatty Acids (FISH OIL) 1000 MG CAPS Take 1,000 mg by mouth 2 (two) times a day.     Marland Kitchen omeprazole (PRILOSEC) 20 MG capsule Take 20 mg by mouth daily.     Vladimir Faster Glycol-Propyl Glycol (LUBRICANT EYE DROPS) 0.4-0.3 % SOLN Place 1-2 drops into both eyes 3 (three) times daily as needed (dry/irritated eyes.).    Marland Kitchen polyethylene glycol powder (GLYCOLAX/MIRALAX) 17 GM/SCOOP powder MIX 17 G AND TAKE BY MOUTH DAILY. 527 g 12  . rOPINIRole (REQUIP) 0.25 MG tablet TAKE 1 TABLET BY MOUTH 3 TIMES A DAY (Patient taking differently: Take 0.25 mg by mouth 3 (three) times daily. ) 270 tablet 3  . simvastatin (ZOCOR) 20 MG tablet TAKE 1 TABLET BY MOUTH EVERYDAY AT BEDTIME (Patient taking differently: Take 20 mg by mouth at bedtime. ) 90 tablet 3  . traMADol (ULTRAM) 50 MG tablet TAKE 1 TABLET BY MOUTH TWICE A DAY AS NEEDED FOR PAIN (Patient taking differently: Take 50 mg by mouth every 12 (twelve) hours as needed (pain). ) 60 tablet 5  . traZODone (DESYREL) 100 MG tablet TAKE 1 TABLET (100 MG TOTAL) BY MOUTH AT BEDTIME. 90 tablet 3  . amiodarone (PACERONE) 100 MG tablet Take 2 tablets ( 200 mg ) daily (Patient taking differently: Take 200 mg by mouth daily. ) 180 tablet 3   No facility-administered medications prior to visit.     Allergies  Allergen Reactions  . Atorvastatin Other (See Comments)    Myalgias in legs  . Crestor [Rosuvastatin] Other (See Comments)    Myalgias in legs  . Morphine And Related Other (See Comments)    "Crazy thoughts, severe headache, sick"  . Prednisone     On two occasions has caused A fib    ROS Review of Systems   Denies chest pain, SOB, palpitations, leg swelling, weakness, bleeding headache, dizziness or memory problems.  Denies  change in bowel, bladder, appetite or wt. Objective:    Physical Exam  BP (!) 152/74   Pulse 67   Wt 132 lb 12.8 oz (60.2 kg)   SpO2 99%   BMI 25.09 kg/m  Wt Readings from Last 3 Encounters:  03/09/19 132 lb 12.8 oz (60.2 kg)  02/17/19 133 lb (60.3 kg)  02/09/19 132 lb (59.9 kg)   HEENT normal Neck supple without masses Lungs clear Cardiac RRR without m or g Abd benign Ext no  edema Neuro, motor, sensory, gait and cognition intact.  Health Maintenance Due  Topic Date Due  . INFLUENZA VACCINE  02/25/2019    There are no preventive care reminders to display for this patient.  Lab Results  Component Value Date   TSH 2.375 01/28/2019   Lab Results  Component Value Date   WBC 4.9 02/17/2019   HGB 10.5 (L) 02/17/2019   HCT 30.8 (L) 02/17/2019   MCV 96 02/17/2019   PLT 216 02/17/2019   Lab Results  Component Value Date   NA 138 02/17/2019   K 5.3 (H) 02/17/2019   CO2 23 02/17/2019   GLUCOSE 89 02/17/2019   BUN 24 02/17/2019   CREATININE 1.83 (H) 02/17/2019   BILITOT 0.4 01/28/2019   ALKPHOS 52 01/28/2019   AST 28 01/28/2019   ALT 23 01/28/2019   PROT 6.2 (L) 01/28/2019   ALBUMIN 3.6 01/28/2019   CALCIUM 9.8 02/17/2019   ANIONGAP 6 01/30/2019   Lab Results  Component Value Date   CHOL 168 07/08/2018   Lab Results  Component Value Date   HDL 58 07/08/2018   Lab Results  Component Value Date   LDLCALC 84 07/08/2018   Lab Results  Component Value Date   TRIG 130 07/08/2018   Lab Results  Component Value Date   CHOLHDL 2.9 07/08/2018   Lab Results  Component Value Date   HGBA1C 5.8 (H) 07/23/2017      Assessment & Plan:   Problem List Items Addressed This Visit    A-fib ([place])   Relevant Medications   amiodarone (PACERONE) 100 MG tablet      Meds ordered this encounter  Medications  . amiodarone (PACERONE) 100 MG tablet    Sig: Take 2 tablets ( 200 mg ) daily    Dispense:  180 tablet    Refill:  3    Follow-up: No follow-ups on file.    [name], MD

## 2019-03-11 DIAGNOSIS — I480 Paroxysmal atrial fibrillation: Secondary | ICD-10-CM | POA: Diagnosis not present

## 2019-03-11 DIAGNOSIS — R5383 Other fatigue: Secondary | ICD-10-CM | POA: Diagnosis not present

## 2019-03-13 ENCOUNTER — Telehealth: Payer: Self-pay | Admitting: Cardiovascular Disease

## 2019-03-13 NOTE — Telephone Encounter (Signed)
Spoke with pt, aware samples of eliquis placed at the front desk and aware we do not get amiodarone.

## 2019-03-13 NOTE — Telephone Encounter (Signed)
New Message    Patient calling the office for samples of medication:   1.  What medication and dosage are you requesting samples for?  Eliquis 2.5mg  1 tablet by mouth 2 times daily and Amiodarone 100mg  2 tablets daily   2.  Are you currently out of this medication?    Patient has two weeks worth of Eliquis left, but is completely out of the Amiodarone.

## 2019-03-16 ENCOUNTER — Ambulatory Visit (INDEPENDENT_AMBULATORY_CARE_PROVIDER_SITE_OTHER): Payer: PPO | Admitting: Family Medicine

## 2019-03-16 ENCOUNTER — Encounter: Payer: Self-pay | Admitting: Family Medicine

## 2019-03-16 ENCOUNTER — Other Ambulatory Visit: Payer: Self-pay

## 2019-03-16 DIAGNOSIS — D485 Neoplasm of uncertain behavior of skin: Secondary | ICD-10-CM | POA: Insufficient documentation

## 2019-03-16 DIAGNOSIS — L720 Epidermal cyst: Secondary | ICD-10-CM | POA: Diagnosis not present

## 2019-03-16 NOTE — Progress Notes (Signed)
Established Patient Office Visit  Subjective:  Patient ID: Jacqueline [name], female    DOB: [birth year]  Age: 85 y.o. MRN: [id number]  CC:  Chief Complaint  Patient presents with  . Skin Problem    HPI ZANITA MILLMAN presents for pigmented skill lesion removal.  Patient recently seen by me for physical.  During that visit, she showed me a pigmented lesion under her right breast that had been growing for approx one year.  I suggested removal because of moderate concern for melanoma. Patient has held her eliquis x 2 days.  Past Medical History:  Diagnosis Date  . Arthritis    back  . Atrial fibrillation with rapid ventricular response ([place]) 02/2014   a. CHA2DS2VASc = 6 -> on eliquis;  b. 02/2014 s/p DCCV;  c. 08/2014 Echo: EF 55-60%, Gr 2 DD, mild MR, triv AI.  Marland Kitchen CAD (coronary artery disease)    a. s/p CABG x 2 (LIMA->LAD, VG->Diag);  b. 08/2014 Cath: LM nl, LAD 90p, LCX nl, RCA nl/dominant, LIMA->LAD atretic, VG->D2 patent w/ retrograde filling of LAD, EF 55-60%-->Med Rx.  . Diverticulitis 02/21/2012  . GERD (gastroesophageal reflux disease)   . Hyperlipemia   . Hypertension   . Insomnia     Past Surgical History:  Procedure Laterality Date  . ABDOMINAL HYSTERECTOMY  1975  . CARDIAC CATHETERIZATION N/A 05/09/2015   Procedure: Right Heart Cath;  Surgeon: Larey Dresser, MD;  Location: [place] CV LAB;  Service: Cardiovascular;  Laterality: N/A;  . CARDIOVERSION N/A 02/28/2014   Procedure: CARDIOVERSION;  Surgeon: Sanda Klein, MD;  Location: [place] ENDOSCOPY;  Service: Cardiovascular;  Laterality: N/A;  . CARDIOVERSION N/A 01/30/2019   Procedure: CARDIOVERSION;  Surgeon: Fay Records, MD;  Location: [facility] ENDOSCOPY;  Service: Cardiovascular;  Laterality: N/A;  . CATARACT EXTRACTION Bilateral 2014   with lens implanted  . COLONOSCOPY N/A 02/19/2015   Procedure: COLONOSCOPY;  Surgeon: Ladene Artist, MD;  Location: [facility] ENDOSCOPY;  Service: Endoscopy;  Laterality: N/A;  . CORONARY ARTERY  BYPASS GRAFT  2006   off-pump bypass surgery with LIMA to the LAD and SVG to the second diagonal artery ( performed by Dr Servando Snare)   . JOINT REPLACEMENT Bilateral    L-2004, R-2006  . LEFT HEART CATHETERIZATION WITH CORONARY/GRAFT ANGIOGRAM N/A 09/17/2014   Procedure: LEFT HEART CATHETERIZATION WITH Beatrix Fetters;  Surgeon: Troy Sine, MD;  Location: [facility] CATH LAB;  Service: Cardiovascular;  Laterality: N/A;  . REIMPLANTATION OF TOTAL KNEE Right 10/08/2014   Procedure: REIMPLANTATION OF RIGHT TOTAL KNEE ARTHROPLASTY WITH REMOVAL OF ANTIBIOTIC SPACER;  Surgeon: Paralee Cancel, MD;  Location: [facility];  Service: Orthopedics;  Laterality: Right;  . ROTATOR CUFF REPAIR Bilateral    r-1999, l- 2005  . TEE WITHOUT CARDIOVERSION N/A 02/28/2014   Procedure: TRANSESOPHAGEAL ECHOCARDIOGRAM (TEE);  Surgeon: Sanda Klein, MD;  Location: [place];  Service: Cardiovascular;  Laterality: N/A;  . TEE WITHOUT CARDIOVERSION N/A 01/30/2019   Procedure: TRANSESOPHAGEAL ECHOCARDIOGRAM (TEE);  Surgeon: Fay Records, MD;  Location: [place];  Service: Cardiovascular;  Laterality: N/A;  . TOTAL KNEE ARTHROPLASTY Right 07/02/2014   Procedure: Resection of Infected Right Total Knee Arthroplasty with placement antibiotic spacer;  Surgeon: Mauri Pole, MD;  Location: [facility];  Service: Orthopedics;  Laterality: Right;    Family History  Problem Relation Age of Onset  . Parkinson's disease Mother   . Heart attack Brother   . Arthritis Brother   . Cancer Brother  Unknown  . Breast cancer Neg Hx     Social History   Socioeconomic History  . Marital status: Widowed    Spouse name: Not on file  . Number of children: 2  . Years of education: 10  . Highest education level: 10th grade  Occupational History  . Not on file  Social Needs  . Financial resource strain: Not very hard  . Food insecurity    Worry: Never true    Inability: Never true  . Transportation needs    Medical: No     Non-medical: No  Tobacco Use  . Smoking status: Never Smoker  . Smokeless tobacco: Never Used  Substance and Sexual Activity  . Alcohol use: No  . Drug use: No  . Sexual activity: Not Currently    Birth control/protection: Post-menopausal  Lifestyle  . Physical activity    Days per week: 7 days    Minutes per session: 10 min  . Stress: Not at all  Relationships  . Social connections    Talks on phone: More than three times a week    Gets together: More than three times a week    Attends religious service: More than 4 times per year    Active member of club or organization: No    Attends meetings of clubs or organizations: Never    Relationship status: Widowed  . Intimate partner violence    Fear of current or ex partner: No    Emotionally abused: No    Physically abused: No    Forced sexual activity: No  Other Topics Concern  . 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 Drinks water and one cup of coffee daily.   Religious / Personal Beliefs: Baptist. "I believe Jesus died on the cross to save me from my sins. I am saved by the grace of God."    Other: "I have a good life and am happy. My family is there when I need help."                                                               Outpatient Medications Prior to Visit  Medication Sig Dispense Refill  . acetaminophen (TYLENOL) 500 MG tablet Take 500 mg by mouth every 6 (six) hours as needed for moderate pain (pain/headache.).     Marland Kitchen ALPRAZolam (XANAX) 0.5 MG  tablet TAKE 1 TABLET BY MOUTH TWICE A DAY AS NEEDED FOR ANXIETY (Patient taking differently: Take 0.5 mg by mouth 2 (two) times daily as needed (anxiety). ) 60 tablet 5  . amiodarone (PACERONE) 100 MG tablet Take 2 tablets ( 200 mg ) daily 180 tablet 3  . apixaban (ELIQUIS) 2.5 MG TABS tablet Take 1 tablet (2.5 mg total) by mouth 2 (  two) times daily. 180 tablet 1  . gabapentin (NEURONTIN) 100 MG capsule TAKE 1 CAPSULE (100 MG TOTAL) BY MOUTH 3 (THREE) TIMES DAILY. 270 capsule 3  . iron polysaccharides (NIFEREX) 150 MG capsule Take 1 capsule (150 mg total) by mouth daily. 100 capsule 3  . Iron-Vitamins (GERITOL COMPLETE PO) Take 1 tablet by mouth daily.    . isosorbide mononitrate (IMDUR) 30 MG 24 hr tablet Take 1 tablet (30 mg total) by mouth daily. (Patient taking differently: Take 30 mg by mouth every evening. ) 90 tablet 3  . levothyroxine (SYNTHROID, LEVOTHROID) 75 MCG tablet Take 1 tablet (75 mcg total) by mouth daily. 90 tablet 3  . meclizine (ANTIVERT) 12.5 MG tablet Take 1 tablet (12.5 mg total) by mouth 3 (three) times daily as needed for dizziness. 30 tablet 2  . nitroGLYCERIN (NITROSTAT) 0.4 MG SL tablet PLACE 1 TABLET UNDER THE TONGUE EVERY 5 MINUTES AS NEEDED FOR CHEST PAIN UP TO 3 DOSES (Patient taking differently: Place 0.4 mg under the tongue every 5 (five) minutes x 3 doses as needed for chest pain. PLACE 1 TABLET UNDER THE TONGUE EVERY 5 MINUTES AS NEEDED FOR CHEST PAIN UP TO 3 DOSES) 25 tablet 1  . Omega-3 Fatty Acids (FISH OIL) 1000 MG CAPS Take 1,000 mg by mouth 2 (two) times a day.     Marland Kitchen omeprazole (PRILOSEC) 20 MG capsule Take 20 mg by mouth daily.    Vladimir Faster Glycol-Propyl Glycol (LUBRICANT EYE DROPS) 0.4-0.3 % SOLN Place 1-2 drops into both eyes 3 (three) times daily as needed (dry/irritated eyes.).    Marland Kitchen polyethylene glycol powder (GLYCOLAX/MIRALAX) 17 GM/SCOOP powder MIX 17 G AND TAKE BY MOUTH DAILY. 527 g 12  . rOPINIRole (REQUIP) 0.25 MG tablet TAKE 1 TABLET BY MOUTH 3  TIMES A DAY (Patient taking differently: Take 0.25 mg by mouth 3 (three) times daily. ) 270 tablet 3  . simvastatin (ZOCOR) 20 MG tablet TAKE 1 TABLET BY MOUTH EVERYDAY AT BEDTIME (Patient taking differently: Take 20 mg by mouth at bedtime. ) 90 tablet 3  . traMADol (ULTRAM) 50 MG tablet TAKE 1 TABLET BY MOUTH TWICE A DAY AS NEEDED FOR PAIN (Patient taking differently: Take 50 mg by mouth every 12 (twelve) hours as needed (pain). ) 60 tablet 5  . traZODone (DESYREL) 100 MG tablet TAKE 1 TABLET (100 MG TOTAL) BY MOUTH AT BEDTIME. 90 tablet 3   No facility-administered medications prior to visit.     Allergies  Allergen Reactions  . Atorvastatin Other (See Comments)    Myalgias in legs  . Crestor [Rosuvastatin] Other (See Comments)    Myalgias in legs  . Morphine And Related Other (See Comments)    "Crazy thoughts, severe headache, sick"  . Prednisone     On two occasions has caused A fib    ROS Review of Systems    Objective:    Physical Exam  BP (!) 152/66   Pulse 64   Wt 133 lb (60.3 kg)   SpO2 99%   BMI 25.13 kg/m  Wt Readings from Last 3 Encounters:  03/16/19 133 lb (60.3 kg)  03/09/19 132 lb 12.8 oz (60.2 kg)  02/17/19 133 lb (60.3 kg)   5 mm nodular pigmented lesion on lower ant chest under right breast.  Slight irregular borders.  Also irregular color  Informed consent:  Xylocaine with epi anesthesia.  Full thickness excisional biopsy with generous (3 mm) margins on both sides.  Closed with 5, 3-0  nylon sutures. Patient tolerated procedure well.  Left clinic in good condition.    Specimen sent to path.   Health Maintenance Due  Topic Date Due  . INFLUENZA VACCINE  02/25/2019    There are no preventive care reminders to display for this patient.  Lab Results  Component Value Date   TSH 2.375 01/28/2019   Lab Results  Component Value Date   WBC 4.9 02/17/2019   HGB 10.5 (L) 02/17/2019   HCT 30.8 (L) 02/17/2019   MCV 96 02/17/2019   PLT 216 02/17/2019    Lab Results  Component Value Date   NA 138 02/17/2019   K 5.3 (H) 02/17/2019   CO2 23 02/17/2019   GLUCOSE 89 02/17/2019   BUN 24 02/17/2019   CREATININE 1.83 (H) 02/17/2019   BILITOT 0.4 01/28/2019   ALKPHOS 52 01/28/2019   AST 28 01/28/2019   ALT 23 01/28/2019   PROT 6.2 (L) 01/28/2019   ALBUMIN 3.6 01/28/2019   CALCIUM 9.8 02/17/2019   ANIONGAP 6 01/30/2019   Lab Results  Component Value Date   CHOL 168 07/08/2018   Lab Results  Component Value Date   HDL 58 07/08/2018   Lab Results  Component Value Date   LDLCALC 84 07/08/2018   Lab Results  Component Value Date   TRIG 130 07/08/2018   Lab Results  Component Value Date   CHOLHDL 2.9 07/08/2018   Lab Results  Component Value Date   HGBA1C 5.8 (H) 07/23/2017      Assessment & Plan:   Problem List Items Addressed This Visit    Neoplasm of uncertain behavior of skin of chest   Relevant Orders   Surgical pathology      No orders of the defined types were placed in this encounter.   Follow-up: No follow-ups on file.    [name], MD

## 2019-03-16 NOTE — Patient Instructions (Signed)
Watch for redness or warmth, signs of infection. I will call with results - likely Monday. Make an appointment to get the stiches out in one week.

## 2019-03-16 NOTE — Assessment & Plan Note (Signed)
Excisional biopsy done.  Await path.  Typical instructions to watch for bleeding and signs of infection.

## 2019-03-17 ENCOUNTER — Telehealth: Payer: Self-pay | Admitting: *Deleted

## 2019-03-17 NOTE — Telephone Encounter (Signed)
The patient's assistance has been approved for Eliquis from 03/16/2019-07/27/2019

## 2019-03-23 ENCOUNTER — Ambulatory Visit (INDEPENDENT_AMBULATORY_CARE_PROVIDER_SITE_OTHER): Payer: PPO | Admitting: Family Medicine

## 2019-03-23 ENCOUNTER — Other Ambulatory Visit: Payer: Self-pay

## 2019-03-23 ENCOUNTER — Encounter: Payer: Self-pay | Admitting: Family Medicine

## 2019-03-23 DIAGNOSIS — M7051 Other bursitis of knee, right knee: Secondary | ICD-10-CM | POA: Diagnosis not present

## 2019-03-23 DIAGNOSIS — Z23 Encounter for immunization: Secondary | ICD-10-CM

## 2019-03-23 NOTE — Progress Notes (Signed)
Established Patient Office Visit  Subjective:  Patient ID: Jacqueline [name], female    DOB: [birth year]  Age: 85 y.o. MRN: [id number]  CC: No chief complaint on file.   HPI Jacqueline [name] presents for Two issues Suture removal from excisional biopsy.  Path was benign.  Patient states wound healing well without redness pain or swelling. Right knee pain.  S/P right knee replacement.  Told by ortho tendonitis.  Given steroid injection without relief.  Past Medical History:  Diagnosis Date  . Arthritis    back  . Atrial fibrillation with rapid ventricular response ([place]) 02/2014   a. CHA2DS2VASc = 6 -> on eliquis;  b. 02/2014 s/p DCCV;  c. 08/2014 Echo: EF 55-60%, Gr 2 DD, mild MR, triv AI.  Marland Kitchen CAD (coronary artery disease)    a. s/p CABG x 2 (LIMA->LAD, VG->Diag);  b. 08/2014 Cath: LM nl, LAD 90p, LCX nl, RCA nl/dominant, LIMA->LAD atretic, VG->D2 patent w/ retrograde filling of LAD, EF 55-60%-->Med Rx.  . Diverticulitis 02/21/2012  . GERD (gastroesophageal reflux disease)   . Hyperlipemia   . Hypertension   . Insomnia     Past Surgical History:  Procedure Laterality Date  . ABDOMINAL HYSTERECTOMY  1975  . CARDIAC CATHETERIZATION N/A 05/09/2015   Procedure: Right Heart Cath;  Surgeon: Larey Dresser, MD;  Location: [place] [facility];  Service: Cardiovascular;  Laterality: N/A;  . CARDIOVERSION N/A 02/28/2014   Procedure: CARDIOVERSION;  Surgeon: Sanda Klein, MD;  Location: [facility]OSCOPY;  Service: Cardiovascular;  Laterality: N/A;  . CARDIOVERSION N/A 01/30/2019   Procedure: CARDIOVERSION;  Surgeon: Fay Records, MD;  Location: [facility] ENDOSCOPY;  Service: Cardiovascular;  Laterality: N/A;  . CATARACT EXTRACTION Bilateral 2014   with lens implanted  . COLONOSCOPY N/A 02/19/2015   Procedure: COLONOSCOPY;  Surgeon: Ladene Artist, MD;  Location: [facility] ENDOSCOPY;  Service: Endoscopy;  Laterality: N/A;  . CORONARY ARTERY BYPASS GRAFT  2006   off-pump bypass surgery with LIMA to the LAD and SVG to  the second diagonal artery ( performed by Dr Servando Snare)   . JOINT REPLACEMENT Bilateral    L-2004, R-2006  . LEFT HEART CATHETERIZATION WITH CORONARY/GRAFT ANGIOGRAM N/A 09/17/2014   Procedure: LEFT HEART CATHETERIZATION WITH Beatrix Fetters;  Surgeon: Troy Sine, MD;  Location: [facility] CATH LAB;  Service: Cardiovascular;  Laterality: N/A;  . REIMPLANTATION OF TOTAL KNEE Right 10/08/2014   Procedure: REIMPLANTATION OF RIGHT TOTAL KNEE ARTHROPLASTY WITH REMOVAL OF ANTIBIOTIC SPACER;  Surgeon: Paralee Cancel, MD;  Location: [facility];  Service: Orthopedics;  Laterality: Right;  . ROTATOR CUFF REPAIR Bilateral    r-1999, l- 2005  . TEE WITHOUT CARDIOVERSION N/A 02/28/2014   Procedure: TRANSESOPHAGEAL ECHOCARDIOGRAM (TEE);  Surgeon: Sanda Klein, MD;  Location: [place];  Service: Cardiovascular;  Laterality: N/A;  . TEE WITHOUT CARDIOVERSION N/A 01/30/2019   Procedure: TRANSESOPHAGEAL ECHOCARDIOGRAM (TEE);  Surgeon: Fay Records, MD;  Location: [place];  Service: Cardiovascular;  Laterality: N/A;  . TOTAL KNEE ARTHROPLASTY Right 07/02/2014   Procedure: Resection of Infected Right Total Knee Arthroplasty with placement antibiotic spacer;  Surgeon: Mauri Pole, MD;  Location: [facility];  Service: Orthopedics;  Laterality: Right;    Family History  Problem Relation Age of Onset  . Parkinson's disease Mother   . Heart attack Brother   . Arthritis Brother   . Cancer Brother        Unknown  . Breast cancer Neg Hx     Social History  Socioeconomic History  . Marital status: Widowed    Spouse name: Not on file  . Number of children: 2  . Years of education: 10  . Highest education level: 10th grade  Occupational History  . Not on file  Social Needs  . Financial resource strain: Not very hard  . Food insecurity    Worry: Never true    Inability: Never true  . Transportation needs    Medical: No    Non-medical: No  Tobacco Use  . Smoking status: Never Smoker  . Smokeless  tobacco: Never Used  Substance and Sexual Activity  . Alcohol use: No  . Drug use: No  . Sexual activity: Not Currently    Birth control/protection: Post-menopausal  Lifestyle  . Physical activity    Days per week: 7 days    Minutes per session: 10 min  . Stress: Not at all  Relationships  . Social connections    Talks on phone: More than three times a week    Gets together: More than three times a week    Attends religious service: More than 4 times per year    Active member of club or organization: No    Attends meetings of clubs or organizations: Never    Relationship status: Widowed  . Intimate partner violence    Fear of current or ex partner: No    Emotionally abused: No    Physically abused: No    Forced sexual activity: No  Other Topics Concern  . 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 Drinks water and one cup of coffee daily.   Religious / Personal Beliefs: Baptist. "I believe Jesus died on the cross to save me [id number] my sins. I am saved by the grace of God."    Other: "I have a good life and am happy. My family is there when I need help."                                                               Outpatient Medications Prior to Visit  Medication Sig Dispense Refill  . acetaminophen (TYLENOL) 500 MG tablet Take 500 mg by mouth every 6 (six) hours as needed for moderate pain (pain/headache.).     Marland Kitchen ALPRAZolam (XANAX) 0.5 MG tablet TAKE 1 TABLET BY MOUTH TWICE A DAY AS NEEDED FOR ANXIETY (Patient taking  differently: Take 0.5 mg by mouth 2 (two) times daily as needed (anxiety). ) 60 tablet 5  . amiodarone (PACERONE) 100 MG tablet Take 2 tablets ( 200 mg ) daily 180 tablet 3  . apixaban (ELIQUIS) 2.5 MG TABS tablet Take 1 tablet (2.5 mg total) by mouth 2 (two) times daily. 180 tablet 1  . gabapentin (NEURONTIN) 100 MG capsule TAKE 1  CAPSULE (100 MG TOTAL) BY MOUTH 3 (THREE) TIMES DAILY. 270 capsule 3  . iron polysaccharides (NIFEREX) 150 MG capsule Take 1 capsule (150 mg total) by mouth daily. 100 capsule 3  . Iron-Vitamins (GERITOL COMPLETE PO) Take 1 tablet by mouth daily.    . isosorbide mononitrate (IMDUR) 30 MG 24 hr tablet Take 1 tablet (30 mg total) by mouth daily. (Patient taking differently: Take 30 mg by mouth every evening. ) 90 tablet 3  . levothyroxine (SYNTHROID, LEVOTHROID) 75 MCG tablet Take 1 tablet (75 mcg total) by mouth daily. 90 tablet 3  . meclizine (ANTIVERT) 12.5 MG tablet Take 1 tablet (12.5 mg total) by mouth 3 (three) times daily as needed for dizziness. 30 tablet 2  . nitroGLYCERIN (NITROSTAT) 0.4 MG SL tablet PLACE 1 TABLET UNDER THE TONGUE EVERY 5 MINUTES AS NEEDED FOR CHEST PAIN UP TO 3 DOSES (Patient taking differently: Place 0.4 mg under the tongue every 5 (five) minutes x 3 doses as needed for chest pain. PLACE 1 TABLET UNDER THE TONGUE EVERY 5 MINUTES AS NEEDED FOR CHEST PAIN UP TO 3 DOSES) 25 tablet 1  . Omega-3 Fatty Acids (FISH OIL) 1000 MG CAPS Take 1,000 mg by mouth 2 (two) times a day.     Marland Kitchen omeprazole (PRILOSEC) 20 MG capsule Take 20 mg by mouth daily.    Vladimir Faster Glycol-Propyl Glycol (LUBRICANT EYE DROPS) 0.4-0.3 % SOLN Place 1-2 drops into both eyes 3 (three) times daily as needed (dry/irritated eyes.).    Marland Kitchen polyethylene glycol powder (GLYCOLAX/MIRALAX) 17 GM/SCOOP powder MIX 17 G AND TAKE BY MOUTH DAILY. 527 g 12  . rOPINIRole (REQUIP) 0.25 MG tablet TAKE 1 TABLET BY MOUTH 3 TIMES A DAY (Patient taking differently: Take 0.25 mg by mouth 3 (three) times  daily. ) 270 tablet 3  . simvastatin (ZOCOR) 20 MG tablet TAKE 1 TABLET BY MOUTH EVERYDAY AT BEDTIME (Patient taking differently: Take 20 mg by mouth at bedtime. ) 90 tablet 3  . traMADol (ULTRAM) 50 MG tablet TAKE 1 TABLET BY MOUTH TWICE A DAY AS NEEDED FOR PAIN (Patient taking differently: Take 50 mg by mouth every 12 (twelve) hours as needed (pain). ) 60 tablet 5  . traZODone (DESYREL) 100 MG tablet TAKE 1 TABLET (100 MG TOTAL) BY MOUTH AT BEDTIME. 90 tablet 3   No facility-administered medications prior to visit.     Allergies  Allergen Reactions  . Atorvastatin Other (See Comments)    Myalgias in legs  . Crestor [Rosuvastatin] Other (See Comments)    Myalgias in legs  . Morphine And Related Other (See Comments)    "Crazy thoughts, severe headache, sick"  . Prednisone     On two occasions has caused A fib    ROS Review of Systems    Objective:    Physical Exam  BP 138/60   Pulse 65   Wt 133 lb 9.6 oz (60.6 kg)   SpO2 97%   BMI 25.24 kg/m  Wt Readings from Last 3 Encounters:  03/23/19 133 lb 9.6 oz (60.6 kg)  03/16/19 133 lb (60.3 kg)  03/09/19 132 lb 12.8 oz (60.2 kg)   Well healing scar under right breast.  5 sutures removed without difficulty. Right knee.  Longituninal scar from knee replacement.  Diffuse pain.  Pain exquisite infero medial to joint line over the pes ansurenus.   Health Maintenance Due  Topic Date Due  . INFLUENZA VACCINE  02/25/2019    There are no preventive care  reminders to display for this patient.  Lab Results  Component Value Date   TSH 2.375 01/28/2019   Lab Results  Component Value Date   WBC 4.9 02/17/2019   HGB 10.5 (L) 02/17/2019   HCT 30.8 (L) 02/17/2019   MCV 96 02/17/2019   PLT 216 02/17/2019   Lab Results  Component Value Date   NA 138 02/17/2019   K 5.3 (H) 02/17/2019   CO2 23 02/17/2019   GLUCOSE 89 02/17/2019   BUN 24 02/17/2019   CREATININE 1.83 (H) 02/17/2019   BILITOT 0.4 01/28/2019   ALKPHOS 52  01/28/2019   AST 28 01/28/2019   ALT 23 01/28/2019   PROT 6.2 (L) 01/28/2019   ALBUMIN 3.6 01/28/2019   CALCIUM 9.8 02/17/2019   ANIONGAP 6 01/30/2019   Lab Results  Component Value Date   CHOL 168 07/08/2018   Lab Results  Component Value Date   HDL 58 07/08/2018   Lab Results  Component Value Date   LDLCALC 84 07/08/2018   Lab Results  Component Value Date   TRIG 130 07/08/2018   Lab Results  Component Value Date   CHOLHDL 2.9 07/08/2018   Lab Results  Component Value Date   HGBA1C 5.8 (H) 07/23/2017      Assessment & Plan:   Problem List Items Addressed This Visit    None    Visit Diagnoses    Need for immunization against influenza       Relevant Orders   Flu Vaccine QUAD 36+ mos IM (Completed)      No orders of the defined types were placed in this encounter.   Follow-up: No follow-ups on file.    [name], MD

## 2019-03-23 NOTE — Assessment & Plan Note (Signed)
Will not repeat the recent steroid injection.  She has tried several topicals but not capsicum.

## 2019-03-23 NOTE — Patient Instructions (Signed)
The wound looks good and the pathology report was benign.  No further worries about that mole. Try capsicum roll on for your knee and other sore areas.  I hope it helps.  It is over the counter.  You can use in as needed.  It tends to work best if you use it regularly twice a day.

## 2019-03-25 ENCOUNTER — Other Ambulatory Visit: Payer: Self-pay | Admitting: Family Medicine

## 2019-04-10 ENCOUNTER — Other Ambulatory Visit: Payer: Self-pay | Admitting: Family Medicine

## 2019-04-10 DIAGNOSIS — Z1231 Encounter for screening mammogram for malignant neoplasm of breast: Secondary | ICD-10-CM

## 2019-04-26 ENCOUNTER — Other Ambulatory Visit: Payer: Self-pay

## 2019-04-26 ENCOUNTER — Ambulatory Visit
Admission: RE | Admit: 2019-04-26 | Discharge: 2019-04-26 | Disposition: A | Discharge status: Home / Self Care | Payer: PPO | Source: Ambulatory Visit | Attending: Family Medicine | Admitting: Family Medicine

## 2019-04-26 DIAGNOSIS — Z1231 Encounter for screening mammogram for malignant neoplasm of breast: Secondary | ICD-10-CM | POA: Diagnosis not present

## 2019-05-02 ENCOUNTER — Other Ambulatory Visit: Payer: Self-pay | Admitting: Family Medicine

## 2019-05-02 DIAGNOSIS — E039 Hypothyroidism, unspecified: Secondary | ICD-10-CM

## 2019-05-15 ENCOUNTER — Telehealth: Payer: Self-pay | Admitting: Cardiovascular Disease

## 2019-05-15 NOTE — Telephone Encounter (Signed)
Called patient, advised that medication still states 2 tablets daily- patient states pharmacy bottle says once daily- advised that the refill should be the same medication, she will check with pharmacy, but will continue has prescribed in chart as I see no changes made to this medication to suggest otherwise.  Patient verbalized understanding.

## 2019-05-15 NOTE — Telephone Encounter (Signed)
New message   Patient states that there is a discrepancy in the dosage of amiodarone (PACERONE) 100 MG tablet. Please advise.

## 2019-05-22 ENCOUNTER — Other Ambulatory Visit: Payer: Self-pay | Admitting: Family Medicine

## 2019-05-26 ENCOUNTER — Ambulatory Visit: Payer: PPO

## 2019-05-30 ENCOUNTER — Telehealth: Payer: Self-pay

## 2019-05-30 ENCOUNTER — Other Ambulatory Visit (INDEPENDENT_AMBULATORY_CARE_PROVIDER_SITE_OTHER): Payer: PPO

## 2019-05-30 ENCOUNTER — Other Ambulatory Visit: Payer: Self-pay

## 2019-05-30 DIAGNOSIS — N390 Urinary tract infection, site not specified: Secondary | ICD-10-CM | POA: Diagnosis not present

## 2019-05-30 LAB — POCT URINALYSIS DIP (MANUAL ENTRY)
Bilirubin, UA: NEGATIVE
Blood, UA: NEGATIVE
Glucose, UA: NEGATIVE mg/dL
Ketones, POC UA: NEGATIVE mg/dL
Nitrite, UA: NEGATIVE
Protein Ur, POC: NEGATIVE mg/dL
Spec Grav, UA: 1.01 (ref 1.010–1.025)
Urobilinogen, UA: 0.2 E.U./dL
pH, UA: 7 (ref 5.0–8.0)

## 2019-05-30 LAB — POCT UA - MICROSCOPIC ONLY

## 2019-05-30 NOTE — Telephone Encounter (Signed)
Trembling legs x 3 days.  Today, right sided lower abd pain.  No fever, chills, urgency, frequency, dysuria, nausea or resp sx.  Denies change in bowel, bladder or appetite.  Hx of UTI and feels like the last time.  Yes, UA and urine culture today.  Keep appointment with me in 2 days.

## 2019-05-30 NOTE — Telephone Encounter (Signed)
Patient LVM on nurse line stating she is has been experiencing flank pain. Patient stated she has an apt with PCP on Thursday, however wants to do a urine test today and possible culture, so results will be back by Thursday. She still plans to come to PCP apt. I tried calling her to get more information, however I had to LVM. Will forward to PCP.

## 2019-06-01 ENCOUNTER — Encounter: Payer: Self-pay | Admitting: Family Medicine

## 2019-06-01 ENCOUNTER — Other Ambulatory Visit: Payer: Self-pay

## 2019-06-01 ENCOUNTER — Ambulatory Visit (INDEPENDENT_AMBULATORY_CARE_PROVIDER_SITE_OTHER): Payer: PPO | Admitting: Family Medicine

## 2019-06-01 DIAGNOSIS — N390 Urinary tract infection, site not specified: Secondary | ICD-10-CM | POA: Diagnosis not present

## 2019-06-01 MED ORDER — CEPHALEXIN 250 MG PO CAPS: 250.0000 mg | ORAL_CAPSULE | Freq: Four times a day (QID) | ORAL | 0 refills | Status: DC

## 2019-06-01 NOTE — Patient Instructions (Signed)
Maybe the weakness is from a urinary tract infection.  I will treat as such and sent in an antibiotic. If you feel better, call me next week and tell me you are OK and cancel the appointment. If you are not feeling better, come in next week and I will do blood test to see if we are missing anything. Start taking the iron every other day.

## 2019-06-01 NOTE — Assessment & Plan Note (Signed)
Perhaps UTI and perhaps asymptomatic bacturia with an as-of-yet undiagnosed cause for her weakness.  Will treat as UTI.  If improves, will assume it is the correct dx.  If not, follow up one week for more testign.

## 2019-06-01 NOTE — Progress Notes (Signed)
Established Patient Office Visit  Subjective:  Patient ID: Jacqueline [name], female    DOB: [birth year]  Age: 85 y.o. MRN: [id number]  CC:  Chief Complaint  Patient presents with  . Shaking all over body    HPI [employer] presents for feeling generally weak and trembly.  No urgency, frequency, dysuria or fever.  However, last time she felt this way, we uncovered a UTI (or asymptomatic bacturia.)  Her sx resolved with treatment.  Now those symptoms have recurred.  Obtained UA 2 days ago.  Suggestive of UTI.  Culture growing 50,000 gm - rods. No other sx.  Denies nausea, vomiting, cough, congestion, sore throat, focal weakness, change in bowel, bladder or appetite.  Past Medical History:  Diagnosis Date  . Arthritis    back  . Atrial fibrillation with rapid ventricular response ([place]) 02/2014   a. CHA2DS2VASc = 6 -> on eliquis;  b. 02/2014 s/p DCCV;  c. 08/2014 Echo: EF 55-60%, Gr 2 DD, mild MR, triv AI.  Marland Kitchen CAD (coronary artery disease)    a. s/p CABG x 2 (LIMA->LAD, VG->Diag);  b. 08/2014 Cath: LM nl, LAD 90p, LCX nl, RCA nl/dominant, LIMA->LAD atretic, VG->D2 patent w/ retrograde filling of LAD, EF 55-60%-->Med Rx.  . Diverticulitis 02/21/2012  . GERD (gastroesophageal reflux disease)   . Hyperlipemia   . Hypertension   . Insomnia     Past Surgical History:  Procedure Laterality Date  . ABDOMINAL HYSTERECTOMY  1975  . CARDIAC CATHETERIZATION N/A 05/09/2015   Procedure: Right Heart Cath;  Surgeon: Larey Dresser, MD;  Location: [place] CV LAB;  Service: Cardiovascular;  Laterality: N/A;  . CARDIOVERSION N/A 02/28/2014   Procedure: CARDIOVERSION;  Surgeon: Sanda Klein, MD;  Location: [place] ENDOSCOPY;  Service: Cardiovascular;  Laterality: N/A;  . CARDIOVERSION N/A 01/30/2019   Procedure: CARDIOVERSION;  Surgeon: Fay Records, MD;  Location: [facility] ENDOSCOPY;  Service: Cardiovascular;  Laterality: N/A;  . CATARACT EXTRACTION Bilateral 2014   with lens implanted  . COLONOSCOPY  N/A 02/19/2015   Procedure: COLONOSCOPY;  Surgeon: Ladene Artist, MD;  Location: [facility] ENDOSCOPY;  Service: Endoscopy;  Laterality: N/A;  . CORONARY ARTERY BYPASS GRAFT  2006   off-pump bypass surgery with LIMA to the LAD and SVG to the second diagonal artery ( performed by Dr Servando Snare)   . JOINT REPLACEMENT Bilateral    L-2004, R-2006  . LEFT HEART CATHETERIZATION WITH CORONARY/GRAFT ANGIOGRAM N/A 09/17/2014   Procedure: LEFT HEART CATHETERIZATION WITH Beatrix Fetters;  Surgeon: Troy Sine, MD;  Location: [facility] CATH LAB;  Service: Cardiovascular;  Laterality: N/A;  . REIMPLANTATION OF TOTAL KNEE Right 10/08/2014   Procedure: REIMPLANTATION OF RIGHT TOTAL KNEE ARTHROPLASTY WITH REMOVAL OF ANTIBIOTIC SPACER;  Surgeon: Paralee Cancel, MD;  Location: [facility];  Service: Orthopedics;  Laterality: Right;  . ROTATOR CUFF REPAIR Bilateral    r-1999, l- 2005  . TEE WITHOUT CARDIOVERSION N/A 02/28/2014   Procedure: TRANSESOPHAGEAL ECHOCARDIOGRAM (TEE);  Surgeon: Sanda Klein, MD;  Location: [place];  Service: Cardiovascular;  Laterality: N/A;  . TEE WITHOUT CARDIOVERSION N/A 01/30/2019   Procedure: TRANSESOPHAGEAL ECHOCARDIOGRAM (TEE);  Surgeon: Fay Records, MD;  Location: [place];  Service: Cardiovascular;  Laterality: N/A;  . TOTAL KNEE ARTHROPLASTY Right 07/02/2014   Procedure: Resection of Infected Right Total Knee Arthroplasty with placement antibiotic spacer;  Surgeon: Mauri Pole, MD;  Location: [facility];  Service: Orthopedics;  Laterality: Right;    Family History  Problem Relation Age of  Onset  . Parkinson's disease Mother   . Heart attack Brother   . Arthritis Brother   . Cancer Brother        Unknown  . Breast cancer Neg Hx     Social History   Socioeconomic History  . Marital status: Widowed    Spouse name: Not on file  . Number of children: 2  . Years of education: 10  . Highest education level: 10th grade  Occupational History  . Not on file  Social Needs  .  Financial resource strain: Not very hard  . Food insecurity    Worry: Never true    Inability: Never true  . Transportation needs    Medical: No    Non-medical: No  Tobacco Use  . Smoking status: Never Smoker  . Smokeless tobacco: Never Used  Substance and Sexual Activity  . Alcohol use: No  . Drug use: No  . Sexual activity: Not Currently    Birth control/protection: Post-menopausal  Lifestyle  . Physical activity    Days per week: 7 days    Minutes per session: 10 min  . Stress: Not at all  Relationships  . Social connections    Talks on phone: More than three times a week    Gets together: More than three times a week    Attends religious service: More than 4 times per year    Active member of club or organization: No    Attends meetings of clubs or organizations: Never    Relationship status: Widowed  . Intimate partner violence    Fear of current or ex partner: No    Emotionally abused: No    Physically abused: No    Forced sexual activity: No  Other Topics Concern  . 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 Drinks water and one cup of coffee daily.   Religious / Personal Beliefs: Baptist. "I believe Jesus died on the cross to save me [id number] my sins. I am saved by the grace of God."    Other: "I have a good life and am happy. My family is there when I need help."                                                               Outpatient Medications Prior to Visit  Medication Sig Dispense Refill  .  acetaminophen (TYLENOL) 500 MG tablet Take 500 mg by mouth every 6 (six) hours as needed for moderate pain (pain/headache.).     Marland Kitchen ALPRAZolam (XANAX) 0.5 MG tablet TAKE 1 TABLET BY MOUTH TWICE A DAY AS NEEDED FOR ANXIETY (Patient taking differently: Take 0.5 mg by mouth 2 (two) times daily as needed (anxiety). ) 60 tablet 5  . amiodarone (PACERONE) 100 MG  tablet Take 2 tablets ( 200 mg ) daily 180 tablet 3  . apixaban (ELIQUIS) 2.5 MG TABS tablet Take 1 tablet (2.5 mg total) by mouth 2 (two) times daily. 180 tablet 1  . gabapentin (NEURONTIN) 100 MG capsule TAKE 1 CAPSULE (100 MG TOTAL) BY MOUTH 3 (THREE) TIMES DAILY. 270 capsule 3  . iron polysaccharides (NIFEREX) 150 MG capsule Take 1 capsule (150 mg total) by mouth daily. 100 capsule 3  . Iron-Vitamins (GERITOL COMPLETE PO) Take 1 tablet by mouth daily.    . isosorbide mononitrate (IMDUR) 30 MG 24 hr tablet Take 1 tablet (30 mg total) by mouth daily. (Patient taking differently: Take 30 mg by mouth every evening. ) 90 tablet 3  . levothyroxine (SYNTHROID) 75 MCG tablet TAKE 1 TABLET BY MOUTH EVERY DAY 90 tablet 3  . meclizine (ANTIVERT) 12.5 MG tablet Take 1 tablet (12.5 mg total) by mouth 3 (three) times daily as needed for dizziness. 30 tablet 2  . nitroGLYCERIN (NITROSTAT) 0.4 MG SL tablet PLACE 1 TABLET UNDER THE TONGUE EVERY 5 MINUTES AS NEEDED FOR CHEST PAIN UP TO 3 DOSES (Patient taking differently: Place 0.4 mg under the tongue every 5 (five) minutes x 3 doses as needed for chest pain. PLACE 1 TABLET UNDER THE TONGUE EVERY 5 MINUTES AS NEEDED FOR CHEST PAIN UP TO 3 DOSES) 25 tablet 1  . Omega-3 Fatty Acids (FISH OIL) 1000 MG CAPS Take 1,000 mg by mouth 2 (two) times a day.     Marland Kitchen omeprazole (PRILOSEC) 20 MG capsule Take 20 mg by mouth daily.    Vladimir Faster Glycol-Propyl Glycol (LUBRICANT EYE DROPS) 0.4-0.3 % SOLN Place 1-2 drops into both eyes 3 (three) times daily as needed (dry/irritated eyes.).    Marland Kitchen polyethylene glycol powder  (GLYCOLAX/MIRALAX) 17 GM/SCOOP powder MIX 17 G AND TAKE BY MOUTH DAILY. 527 g 12  . rOPINIRole (REQUIP) 0.25 MG tablet TAKE 1 TABLET BY MOUTH THREE TIMES A DAY 270 tablet 3  . simvastatin (ZOCOR) 20 MG tablet TAKE 1 TABLET BY MOUTH EVERYDAY AT BEDTIME (Patient taking differently: Take 20 mg by mouth at bedtime. ) 90 tablet 3  . traMADol (ULTRAM) 50 MG tablet Take 1 tablet (50 mg total) by mouth every 12 (twelve) hours as needed (pain). 60 tablet 5  . traZODone (DESYREL) 100 MG tablet TAKE 1 TABLET (100 MG TOTAL) BY MOUTH AT BEDTIME. 90 tablet 3   No facility-administered medications prior to visit.     Allergies  Allergen Reactions  . Atorvastatin Other (See Comments)    Myalgias in legs  . Crestor [Rosuvastatin] Other (See Comments)    Myalgias in legs  . Morphine And Related Other (See Comments)    "Crazy thoughts, severe headache, sick"  . Prednisone     On two occasions has caused A fib    ROS Review of Systems    Objective:    Physical Exam  BP (!) 158/76   Pulse 68   Wt 131 lb 9.6 oz (59.7 kg)   SpO2 100%   BMI 24.87 kg/m  Wt Readings from Last 3 Encounters:  06/01/19 131 lb 9.6 oz (59.7 kg)  03/23/19 133 lb 9.6 oz (60.6 kg)  03/16/19 133 lb (60.3 kg)   Pink conjunctiva suggest no anemia. Lungs clear Cardiac RRR without m or g Abd benign.   There are no preventive care reminders to display for this patient.  There are no preventive care reminders to display for this patient.  Lab  Results  Component Value Date   TSH 2.375 01/28/2019   Lab Results  Component Value Date   WBC 4.9 02/17/2019   HGB 10.5 (L) 02/17/2019   HCT 30.8 (L) 02/17/2019   MCV 96 02/17/2019   PLT 216 02/17/2019   Lab Results  Component Value Date   NA 138 02/17/2019   K 5.3 (H) 02/17/2019   CO2 23 02/17/2019   GLUCOSE 89 02/17/2019   BUN 24 02/17/2019   CREATININE 1.83 (H) 02/17/2019   BILITOT 0.4 01/28/2019   ALKPHOS 52 01/28/2019   AST 28 01/28/2019   ALT 23 01/28/2019    PROT 6.2 (L) 01/28/2019   ALBUMIN 3.6 01/28/2019   CALCIUM 9.8 02/17/2019   ANIONGAP 6 01/30/2019   Lab Results  Component Value Date   CHOL 168 07/08/2018   Lab Results  Component Value Date   HDL 58 07/08/2018   Lab Results  Component Value Date   LDLCALC 84 07/08/2018   Lab Results  Component Value Date   TRIG 130 07/08/2018   Lab Results  Component Value Date   CHOLHDL 2.9 07/08/2018   Lab Results  Component Value Date   HGBA1C 5.8 (H) 07/23/2017      Assessment & Plan:   Problem List Items Addressed This Visit    Urinary tract infection   Relevant Medications   cephALEXin (KEFLEX) 250 MG capsule      Meds ordered this encounter  Medications  . cephALEXin (KEFLEX) 250 MG capsule    Sig: Take 1 capsule (250 mg total) by mouth 4 (four) times daily.    Dispense:  28 capsule    Refill:  0    Follow-up: No follow-ups on file.    [name], MD

## 2019-06-02 LAB — URINE CULTURE

## 2019-06-05 ENCOUNTER — Telehealth: Payer: Self-pay

## 2019-06-05 MED ORDER — CIPROFLOXACIN HCL 500 MG PO TABS: 500.0000 mg | ORAL_TABLET | Freq: Two times a day (BID) | ORAL | 0 refills | Status: DC

## 2019-06-05 NOTE — Telephone Encounter (Signed)
[facility] calls nurse line for possible drug interaction. Patient was given cephalexin this am, however she is on amiodarone. Per pharmacist, there is a possibility for prolonged QT intervals while taking both of these medications. If no concern please alert pharmacy so they can fill antibiotic for patient. Will forward to PCP.

## 2019-06-05 NOTE — Addendum Note (Signed)
Addended by: [name] on: 06/05/2019 09:23 AM   Modules accepted: Orders

## 2019-06-05 NOTE — Telephone Encounter (Signed)
Left message that it is OK to fill

## 2019-06-05 NOTE — Assessment & Plan Note (Signed)
Urine culture resistant to cephalexin.  Called and switched to cipro.

## 2019-06-08 ENCOUNTER — Ambulatory Visit (INDEPENDENT_AMBULATORY_CARE_PROVIDER_SITE_OTHER): Payer: PPO | Admitting: Family Medicine

## 2019-06-08 ENCOUNTER — Encounter: Payer: Self-pay | Admitting: Family Medicine

## 2019-06-08 ENCOUNTER — Other Ambulatory Visit: Payer: Self-pay

## 2019-06-08 ENCOUNTER — Ambulatory Visit (HOSPITAL_COMMUNITY)
Admission: RE | Admit: 2019-06-08 | Discharge: 2019-06-08 | Disposition: A | Discharge status: Home / Self Care | Payer: PPO | Source: Ambulatory Visit | Attending: Family Medicine | Admitting: Family Medicine

## 2019-06-08 VITALS — BP 120/64 | HR 74 | Wt 133.2 lb

## 2019-06-08 DIAGNOSIS — I4811 Longstanding persistent atrial fibrillation: Secondary | ICD-10-CM | POA: Diagnosis not present

## 2019-06-08 DIAGNOSIS — R296 Repeated falls: Secondary | ICD-10-CM

## 2019-06-08 DIAGNOSIS — R531 Weakness: Secondary | ICD-10-CM | POA: Diagnosis not present

## 2019-06-08 DIAGNOSIS — D500 Iron deficiency anemia secondary to blood loss (chronic): Secondary | ICD-10-CM | POA: Diagnosis not present

## 2019-06-08 DIAGNOSIS — Z79899 Other long term (current) drug therapy: Secondary | ICD-10-CM | POA: Diagnosis not present

## 2019-06-08 DIAGNOSIS — R7401 Elevation of levels of liver transaminase levels: Secondary | ICD-10-CM

## 2019-06-08 DIAGNOSIS — N184 Chronic kidney disease, stage 4 (severe): Secondary | ICD-10-CM

## 2019-06-08 DIAGNOSIS — I4819 Other persistent atrial fibrillation: Secondary | ICD-10-CM | POA: Diagnosis not present

## 2019-06-08 DIAGNOSIS — I5032 Chronic diastolic (congestive) heart failure: Secondary | ICD-10-CM

## 2019-06-08 DIAGNOSIS — I10 Essential (primary) hypertension: Secondary | ICD-10-CM

## 2019-06-08 MED ORDER — AMIODARONE HCL 200 MG PO TABS: 200.0000 mg | ORAL_TABLET | Freq: Every day | ORAL | 3 refills | Status: DC

## 2019-06-08 NOTE — Assessment & Plan Note (Signed)
Weakness causing falls.  Will refer to PT for eval and safety

## 2019-06-08 NOTE — Assessment & Plan Note (Signed)
Will recheck creat.  Uremia could explain, but I see no reason for the renal function to worsen rapidly.

## 2019-06-08 NOTE — Assessment & Plan Note (Signed)
Does not seem pale on exam but anemia would explain sx.

## 2019-06-08 NOTE — Assessment & Plan Note (Addendum)
Could be overtreated with hypotension causing problems.  Ambulatory BP monitoring would be a second order test.  Called with lab results.  I confirmed she is not having any chest pain.  Told her to stop the imdur because of my concern for hypotension.  She knows to call if chest pain.

## 2019-06-08 NOTE — Progress Notes (Signed)
  Subjective:     Patient ID: Jacqueline [name], female   DOB: [birth year], 85 y.o.   MRN: [id number]  HPI   Jacqueline [name] has been "trembly" and had generalized weakness for weeks.  I have treated for a UTI and it seems increasingly likely that she has asymptomatic bacturia which is not causing her symptoms. She has been weak enough to fall x 2.  "My legs just give out." Only focal symptom is a new mild cough only present for 1-2 days. Has known A fib now on amiodorone and eliquis  No palpitations. Has previous GI bleed on anticoag.  No blood in the stools   Review of SystemsDenies HA, focal weakness, chest pain, shortness of breath, pedal edema.  Denies change in bowel, bladder or appetite.     Objective:   Physical Exam VS noted.  BP in good range.  HR normal.  Wt normal. HEENT conjunctiva not pale.   Neck no masses Lungs clear Cardiac RRR without m or g Abd benign Ext no edema.Palms not pale     Assessment:     See problems    Plan:     See problems

## 2019-06-08 NOTE — Assessment & Plan Note (Signed)
Very large diff.  Hypothyroid (amiodorone) PMR - check sed rate.  Etc.  STart with the usual labs and proceed from their.

## 2019-06-08 NOTE — Patient Instructions (Addendum)
I will call with lab test results. Someone should call you about physical therapy. We will get to the bottom of this.

## 2019-06-08 NOTE — Assessment & Plan Note (Signed)
EKG confirms normal sinus rhythm with normal heart rate.

## 2019-06-09 DIAGNOSIS — R7401 Elevation of levels of liver transaminase levels: Secondary | ICD-10-CM | POA: Insufficient documentation

## 2019-06-09 LAB — TSH: TSH: 1.32 u[IU]/mL (ref 0.450–4.500)

## 2019-06-09 LAB — CMP14+EGFR
ALT: 26 IU/L (ref 0–32)
AST: 46 IU/L — ABNORMAL HIGH (ref 0–40)
Albumin/Globulin Ratio: 2 (ref 1.2–2.2)
Albumin: 4.4 g/dL (ref 3.6–4.6)
Alkaline Phosphatase: 74 IU/L (ref 39–117)
BUN/Creatinine Ratio: 11 — ABNORMAL LOW (ref 12–28)
BUN: 19 mg/dL (ref 8–27)
Bilirubin Total: 0.3 mg/dL (ref 0.0–1.2)
CO2: 24 mmol/L (ref 20–29)
Calcium: 9.4 mg/dL (ref 8.7–10.3)
Chloride: 102 mmol/L (ref 96–106)
Creatinine, Ser: 1.73 mg/dL — ABNORMAL HIGH (ref 0.57–1.00)
GFR calc Af Amer: 30 mL/min/{1.73_m2} — ABNORMAL LOW (ref >59)
GFR calc non Af Amer: 26 mL/min/{1.73_m2} — ABNORMAL LOW (ref >59)
Globulin, Total: 2.2 g/dL (ref 1.5–4.5)
Glucose: 71 mg/dL (ref 65–99)
Potassium: 4.1 mmol/L (ref 3.5–5.2)
Sodium: 140 mmol/L (ref 134–144)
Total Protein: 6.6 g/dL (ref 6.0–8.5)

## 2019-06-09 LAB — CBC
Hematocrit: 33.8 % — ABNORMAL LOW (ref 34.0–46.6)
Hemoglobin: 11 g/dL — ABNORMAL LOW (ref 11.1–15.9)
MCH: 31.6 pg (ref 26.6–33.0)
MCHC: 32.5 g/dL (ref 31.5–35.7)
MCV: 97 fL (ref 79–97)
Platelets: 182 10*3/uL (ref 150–450)
RBC: 3.48 x10E6/uL — ABNORMAL LOW (ref 3.77–5.28)
RDW: 12.9 % (ref 11.7–15.4)
WBC: 3.8 10*3/uL (ref 3.4–10.8)

## 2019-06-09 LAB — SEDIMENTATION RATE: Sed Rate: 10 mm/hr (ref 0–40)

## 2019-06-09 NOTE — Assessment & Plan Note (Signed)
Labs show mild, new elevation of AST.  No intervention for now.  I am aware that this might be a clue that needs follow up if she continues to have weakness symptoms.

## 2019-06-09 NOTE — Addendum Note (Signed)
Addended by: [name] on: 06/09/2019 12:02 PM   Modules accepted: Orders

## 2019-06-19 ENCOUNTER — Telehealth (INDEPENDENT_AMBULATORY_CARE_PROVIDER_SITE_OTHER): Payer: PPO | Admitting: Family Medicine

## 2019-06-19 ENCOUNTER — Encounter: Payer: Self-pay | Admitting: Family Medicine

## 2019-06-19 DIAGNOSIS — I10 Essential (primary) hypertension: Secondary | ICD-10-CM

## 2019-06-19 DIAGNOSIS — I5032 Chronic diastolic (congestive) heart failure: Secondary | ICD-10-CM

## 2019-06-19 DIAGNOSIS — I208 Other forms of angina pectoris: Secondary | ICD-10-CM

## 2019-06-19 MED ORDER — ISOSORBIDE MONONITRATE ER 30 MG PO TB24: 15.0000 mg | ORAL_TABLET | Freq: Every day | ORAL | 6 refills | Status: DC

## 2019-06-19 NOTE — Assessment & Plan Note (Signed)
Restart imdur at lower dose.  Hold if morning BP is low.

## 2019-06-19 NOTE — Assessment & Plan Note (Addendum)
We decided 1/2 isosorbide mononitrate (15 mg) daily.  Take blood pressure first.  Hold if systolic BP less than 578.  It sounds like imdur treats her hypertension. She will follow up with me by phone in 2 weks.

## 2019-06-19 NOTE — Progress Notes (Signed)
Agrees to phone note.  No video capability.  FU generalized weakness.  Last visit stopped imdur 30 mg daily out of concerns for low BP.  Had a very good 2 weeks feeling much better.  Over the last two days, again has felt weak and had some chest tightness.  No tightness now.  REviewed last cath a few years ago, no CAD. She takes her BP each morning  One very low reading with systolic= 90.  Over last week has crept up with most readings above 150.  She has noticed a general pattern that the lower her BP, the weaker she feels.   No new symptoms.  Denies ankle swelling.  Duration of visit22 minutes.

## 2019-06-19 NOTE — Assessment & Plan Note (Signed)
Sounds stable.  Denies SOB or leg swelling

## 2019-06-26 ENCOUNTER — Telehealth: Payer: Self-pay

## 2019-06-26 NOTE — Telephone Encounter (Signed)
BP remains very labile.  She still feels weak.  I will see this week.  May need to restart BP meds.

## 2019-06-26 NOTE — Telephone Encounter (Signed)
Done. Quashon Jesus T Tamerra Merkley, CMA  

## 2019-06-26 NOTE — Telephone Encounter (Signed)
Pt calling to speak to Dr. Andria Frames. Pt took BP this am 182/81 and 10 mins later BP was 179/81. Pt wants to know if she should take more BP medication.Please call pt at [phone]  Ottis Stain, CMA

## 2019-06-28 ENCOUNTER — Other Ambulatory Visit: Payer: Self-pay

## 2019-06-28 ENCOUNTER — Ambulatory Visit (INDEPENDENT_AMBULATORY_CARE_PROVIDER_SITE_OTHER): Payer: PPO | Admitting: Family Medicine

## 2019-06-28 ENCOUNTER — Encounter: Payer: Self-pay | Admitting: Family Medicine

## 2019-06-28 DIAGNOSIS — I10 Essential (primary) hypertension: Secondary | ICD-10-CM | POA: Diagnosis not present

## 2019-06-28 DIAGNOSIS — I5032 Chronic diastolic (congestive) heart failure: Secondary | ICD-10-CM

## 2019-06-28 DIAGNOSIS — N184 Chronic kidney disease, stage 4 (severe): Secondary | ICD-10-CM | POA: Diagnosis not present

## 2019-06-28 DIAGNOSIS — R7401 Elevation of levels of liver transaminase levels: Secondary | ICD-10-CM | POA: Diagnosis not present

## 2019-06-28 DIAGNOSIS — R531 Weakness: Secondary | ICD-10-CM | POA: Diagnosis not present

## 2019-06-28 MED ORDER — AMLODIPINE BESYLATE 2.5 MG PO TABS: 2.5000 mg | ORAL_TABLET | Freq: Every day | ORAL | 3 refills | Status: DC

## 2019-06-28 MED ORDER — ISOSORBIDE MONONITRATE ER 30 MG PO TB24: 30.0000 mg | ORAL_TABLET | Freq: Every day | ORAL | 6 refills | Status: DC

## 2019-06-28 NOTE — Assessment & Plan Note (Signed)
VERY mild AST elevation.  Doubt contributing.  Will repeat labs.

## 2019-06-28 NOTE — Progress Notes (Signed)
   Subjective:    Patient ID: Jacqueline [name], female    DOB: 1931/01/07, 85 y.o.   MRN: [id number]  HPI  FU of ongoing concern of weakness and labile blood pressure.  The saga began in early November, perhaps as early as July.  Patient has been a high functioning, active 85 yo and has had vague complaints of weakness, trembling legs and feeling poorly.   She has fallen on at least three occasions.  She describes dizziness - not vertigo, more like orthostasis.  Screening labs have been reassuring.  She has no focal symptom on ROS on several occasions.I have considered several possibilities along the way.  1. Labile hypertension with high systolics, wide pulse pressure and low diastolics.  I have been trying to thread the needle with her BP meds.  She brings in list from home.  Over the last one week, all readings have been consistently high.   2. Bradycardia.  I have discontinued her Beta blocker.  She remains on amio per cards.  Recent HRs have all been normal 3. Deconditioning is likely playing a role.  She and I have been reluctant to order PT during the [place] pandemic. 4. We have treated possible UTIs.  Looking back, I believe this has been a distractor and that she has asymptomatic bacturia.  She specifcally denies, urgency, frequency and dysura. 5. CHF is certainly a problem.  She has appeared euvolumic during these visits.  She denies leg swelling or DOE.    Review of Systems     Objective:   Physical Exam VS and wt noted. Lungs clear Cardiac RRR without m or g Abd benign Ext no edema.        Assessment & Plan:

## 2019-06-28 NOTE — Assessment & Plan Note (Signed)
Deconditioning and BP are my main suspects.  No PT for now given COVID.

## 2019-06-28 NOTE — Patient Instructions (Signed)
Go back to taking a full isosorbite mononitrate every day.   As I wrote in your blood pressure book, we need to walk a fine line.  We want the top number mostly below 160 and the bottom number above 60.  Because you have been running high, I am going to prescribe a baby dose of a new blood pressure medication, amlodipine.   Call me if your average blood pressure is running too high or too low.   I will call you with the lab test results.   Try to stay out of my ofice for the next four months - until I can get you a COVID vaccine.   I will likely recommend some physical therapy, once COVID is no longer a concern.

## 2019-06-28 NOTE — Assessment & Plan Note (Signed)
GFR seems stable. I doubt CKD is contributing to her symptoms.

## 2019-06-28 NOTE — Assessment & Plan Note (Signed)
Wt of 132 is at dry wt off diuretics.

## 2019-06-28 NOTE — Assessment & Plan Note (Signed)
Trying to thread the needle of controling systolic hypertension while at the same time keeping diastolic ET>48.  Add amlodipine 2.5 mg daily.

## 2019-06-29 LAB — CMP14+EGFR
ALT: 23 IU/L (ref 0–32)
AST: 37 IU/L (ref 0–40)
Albumin/Globulin Ratio: 2.3 — ABNORMAL HIGH (ref 1.2–2.2)
Albumin: 4.6 g/dL (ref 3.6–4.6)
Alkaline Phosphatase: 82 IU/L (ref 39–117)
BUN/Creatinine Ratio: 13 (ref 12–28)
BUN: 22 mg/dL (ref 8–27)
Bilirubin Total: 0.3 mg/dL (ref 0.0–1.2)
CO2: 24 mmol/L (ref 20–29)
Calcium: 10 mg/dL (ref 8.7–10.3)
Chloride: 103 mmol/L (ref 96–106)
Creatinine, Ser: 1.69 mg/dL — ABNORMAL HIGH (ref 0.57–1.00)
GFR calc Af Amer: 31 mL/min/{1.73_m2} — ABNORMAL LOW (ref >59)
GFR calc non Af Amer: 27 mL/min/{1.73_m2} — ABNORMAL LOW (ref >59)
Globulin, Total: 2 g/dL (ref 1.5–4.5)
Glucose: 80 mg/dL (ref 65–99)
Potassium: 4.6 mmol/L (ref 3.5–5.2)
Sodium: 141 mmol/L (ref 134–144)
Total Protein: 6.6 g/dL (ref 6.0–8.5)

## 2019-07-01 ENCOUNTER — Other Ambulatory Visit: Payer: Self-pay | Admitting: Family Medicine

## 2019-07-01 DIAGNOSIS — F4329 Adjustment disorder with other symptoms: Secondary | ICD-10-CM

## 2019-07-04 ENCOUNTER — Encounter: Payer: Self-pay | Admitting: Cardiovascular Disease

## 2019-07-04 NOTE — Telephone Encounter (Signed)
Per pt B/p readings are all taken in the pm and last lower readings are after PCP started pt on Amlodipine 2.5 mg Pt c/o fatigue weakness Pt would like to change 07/14/19 appt to virtual if Dr [name] agrees Billey Gosling forward to Dr [name] for review and recommendations ./cy

## 2019-07-04 NOTE — Telephone Encounter (Signed)
This encounter was created in error - please disregard.

## 2019-07-04 NOTE — Telephone Encounter (Signed)
°  Pt c/o BP issue: STAT if pt c/o blurred vision, one-sided weakness or slurred speech  1. What are your last 5 BP readings?  12/01: 177/92 HR 69 12/02: 175/83 HR 66 12/03: 150/70 HR 68 12/04: 150/70 HR 68 12/05: 132/55 HR 66 12/06: 133/59 HR 67 12/07: 129/67 HR 68 12/08: 136/58 HR 66  2. Are you having any other symptoms (ex. Dizziness, headache, blurred vision, passed out)? Blurry vision, d izziness, weakness, fatigue for at least two weeks now.  3. What is your BP issue? Fluctuating BP   Pt contacted her PCP and her PCP wants her to stay in and avoid crowds during COVID and the Flu season. Patient is concerned about her BP and feels she may need to come in anyway.   PCP recently prescribed Amlodipine 2.5 mg for the patient and the first dose she took was  Friday 120/4 at night.

## 2019-07-05 NOTE — Telephone Encounter (Signed)
Left a message for the patient to call back. Her appointment has been changed to virtual per her request. She will need to get a weight, blood pressure and heart rate that morning if possible.

## 2019-07-06 ENCOUNTER — Telehealth: Payer: Self-pay | Admitting: *Deleted

## 2019-07-06 NOTE — Telephone Encounter (Signed)
Spoke with the patient. She stated that her blood pressure this morning was:  124/54 Heart rate 70  She has been advised that if the bottom number goes lower or she becomes symptomatic to please call the office back otherwise we talk to her at her virtual appointment next week.

## 2019-07-06 NOTE — Telephone Encounter (Signed)
Virtual Visit Pre-Appointment Phone Call  "(Name), I am calling you today to discuss your upcoming appointment. We are currently trying to limit exposure to the virus that causes COVID-19 by seeing patients at home rather than in the office."  1. "What is the BEST phone number to call the day of the visit?" - include this in appointment notes  2. "Do you have or have access to (through a family member/friend) a smartphone with video capability that we can use for your visit?" a. If yes - list this number in appt notes as "cell" (if different from BEST phone #) and list the appointment type as a VIDEO visit in appointment notes b. If no - list the appointment type as a PHONE visit in appointment notes  Confirm consent - "In the setting of the current Covid19 crisis, you are scheduled for a (phone or video) visit with your provider on (date) at (time).  Just as we do with many in-office visits, in order for you to participate in this visit, we must obtain consent.  If you'd like, I can send this to your mychart (if signed up) or email for you to review.  Otherwise, I can obtain your verbal consent now.  All virtual visits are billed to your insurance company just like a normal visit would be.  By agreeing to a virtual visit, we'd like you to understand that the technology does not allow for your provider to perform an examination, and thus may limit your provider's ability to fully assess your condition. If your provider identifies any concerns that need to be evaluated in person, we will make arrangements to do so.  Finally, though the technology is pretty good, we cannot assure that it will always work on either your or our end, and in the setting of a video visit, we may have to convert it to a phone-only visit.  In either situation, we cannot ensure that we have a secure connection.  Are you willing to proceed?" YES 3. Advise patient to be prepared - "Two hours prior to your appointment, go ahead  and check your blood pressure, pulse, oxygen saturation, and your weight (if you have the equipment to check those) and write them all down. When your visit starts, your provider will ask you for this information. If you have an Apple Watch or Kardia device, please plan to have heart rate information ready on the day of your appointment. Please have a pen and paper handy nearby the day of the visit as well."  4. Give patient instructions for MyChart download to smartphone OR [email or web address] as below if video visit (depending on what platform provider is using)  5. Inform patient they will receive a phone call 15 minutes prior to their appointment time (may be from unknown caller ID) so they should be prepared to answer    TELEPHONE CALL NOTE  Jacqueline [name] has been deemed a candidate for a follow-up tele-health visit to limit community exposure during the Covid-19 pandemic. I spoke with the patient via phone to ensure availability of phone/video source, confirm preferred email & phone number, and discuss instructions and expectations.  I reminded Jacqueline [name] to be prepared with any vital sign and/or heart rhythm information that could potentially be obtained via home monitoring, at the time of her visit. I reminded Jacqueline [name] to expect a phone call prior to her visit.  Jacqueline Barker, RN 07/06/2019 1:56 PM   INSTRUCTIONS FOR DOWNLOADING  THE MYCHART APP TO SMARTPHONE  - The patient must first make sure to have activated MyChart and know their login information - If Apple, go to [employer] and type in MyChart in the search bar and download the app. If Android, ask patient to go to [employer] and type in [place] in the search bar and download the app. The app is free but as with any other app downloads, their phone may require them to verify saved payment information or Apple/Android password.  - The patient will need to then log into the app with their MyChart username and  password, and select [facility] as their healthcare provider to link the account. When it is time for your visit, go to the MyChart app, find appointments, and click Begin Video Visit. Be sure to Select Allow for your device to access the Microphone and Camera for your visit. You will then be connected, and your provider will be with you shortly.  **If they have any issues connecting, or need assistance please contact MyChart service desk [phone])**  **If using a computer, in order to ensure the best quality for their visit they will need to use either of the following Internet Browsers: [employer], or Google Chrome**  IF USING DOXIMITY or DOXY.ME - The patient will receive a link just prior to their visit by text.     FULL LENGTH CONSENT FOR TELE-HEALTH VISIT   I hereby voluntarily request, consent and authorize [facility] and its employed or contracted physicians, physician assistants, nurse practitioners or other licensed health care professionals (the Practitioner), to provide me with telemedicine health care services (the "Services") as deemed necessary by the treating Practitioner. I acknowledge and consent to receive the Services by the Practitioner via telemedicine. I understand that the telemedicine visit will involve communicating with the Practitioner through live audiovisual communication technology and the disclosure of certain medical information by electronic transmission. I acknowledge that I have been given the opportunity to request an in-person assessment or other available alternative prior to the telemedicine visit and am voluntarily participating in the telemedicine visit.  I understand that I have the right to withhold or withdraw my consent to the use of telemedicine in the course of my care at any time, without affecting my right to future care or treatment, and that the Practitioner or I may terminate the telemedicine visit at any time. I  understand that I have the right to inspect all information obtained and/or recorded in the course of the telemedicine visit and may receive copies of available information for a reasonable fee.  I understand that some of the potential risks of receiving the Services via telemedicine include:  Marland Kitchen Delay or interruption in medical evaluation due to technological equipment failure or disruption; . Information transmitted may not be sufficient (e.g. poor resolution of images) to allow for appropriate medical decision making by the Practitioner; and/or  . In rare instances, security protocols could fail, causing a breach of personal health information.  Furthermore, I acknowledge that it is my responsibility to provide information about my medical history, conditions and care that is complete and accurate to the best of my ability. I acknowledge that Practitioner's advice, recommendations, and/or decision may be based on factors not within their control, such as incomplete or inaccurate data provided by me or distortions of diagnostic images or specimens that may result from electronic transmissions. I understand that the practice of medicine is not an exact science and that Practitioner  makes no warranties or guarantees regarding treatment outcomes. I acknowledge that I will receive a copy of this consent concurrently upon execution via email to the email address I last provided but may also request a printed copy by calling the office of [facility].    I understand that my insurance will be billed for this visit.   I have read or had this consent read to me. . I understand the contents of this consent, which adequately explains the benefits and risks of the Services being provided via telemedicine.  . I have been provided ample opportunity to ask questions regarding this consent and the Services and have had my questions answered to my satisfaction. . I give my informed consent for the services to be  provided through the use of telemedicine in my medical care  By participating in this telemedicine visit I agree to the above.

## 2019-07-14 ENCOUNTER — Encounter: Payer: Self-pay | Admitting: Cardiovascular Disease

## 2019-07-14 ENCOUNTER — Ambulatory Visit: Payer: PPO | Admitting: Cardiovascular Disease

## 2019-07-14 ENCOUNTER — Telehealth (INDEPENDENT_AMBULATORY_CARE_PROVIDER_SITE_OTHER): Payer: PPO | Admitting: Cardiovascular Disease

## 2019-07-14 VITALS — BP 139/66 | HR 66 | Ht 61.0 in | Wt 129.0 lb

## 2019-07-14 DIAGNOSIS — E785 Hyperlipidemia, unspecified: Secondary | ICD-10-CM

## 2019-07-14 DIAGNOSIS — I25708 Atherosclerosis of coronary artery bypass graft(s), unspecified, with other forms of angina pectoris: Secondary | ICD-10-CM | POA: Diagnosis not present

## 2019-07-14 DIAGNOSIS — N184 Chronic kidney disease, stage 4 (severe): Secondary | ICD-10-CM | POA: Diagnosis not present

## 2019-07-14 DIAGNOSIS — Z79899 Other long term (current) drug therapy: Secondary | ICD-10-CM | POA: Diagnosis not present

## 2019-07-14 DIAGNOSIS — Z951 Presence of aortocoronary bypass graft: Secondary | ICD-10-CM

## 2019-07-14 DIAGNOSIS — I4819 Other persistent atrial fibrillation: Secondary | ICD-10-CM

## 2019-07-14 DIAGNOSIS — I700 Atherosclerosis of aorta: Secondary | ICD-10-CM | POA: Diagnosis not present

## 2019-07-14 DIAGNOSIS — I5032 Chronic diastolic (congestive) heart failure: Secondary | ICD-10-CM | POA: Diagnosis not present

## 2019-07-14 DIAGNOSIS — I2721 Secondary pulmonary arterial hypertension: Secondary | ICD-10-CM | POA: Diagnosis not present

## 2019-07-14 NOTE — Patient Instructions (Addendum)
Medication Instructions: NO changes  *If you need a refill on your cardiac medications before your next appointment, please call your pharmacy*  Lab Work: none    Testing/Procedures: none  Follow-Up: At [facility], you and your health needs are our priority.  As part of our continuing mission to provide you with exceptional heart care, we have created designated Provider Care Teams.  These Care Teams include your primary Cardiologist (physician) and Advanced Practice Providers (APPs -  Physician Assistants and Nurse Practitioners) who all work together to provide you with the care you need, when you need it.  Your next appointment:   6 month(s)  The format for your next appointment:   Either In Person or Virtual  Provider:   Sanda Klein, MD

## 2019-07-14 NOTE — Progress Notes (Signed)
Virtual Visit via Telephone Note   This visit type was conducted due to national recommendations for restrictions regarding the COVID-19 Pandemic (e.g. social distancing) in an effort to limit this patient's exposure and mitigate transmission in our community.  Due to her co-morbid illnesses, this patient is at least at moderate risk for complications without adequate follow up.  This format is felt to be most appropriate for this patient at this time.  The patient did not have access to video technology/had technical difficulties with video requiring transitioning to audio format only (telephone).  All issues noted in this document were discussed and addressed.  No physical exam could be performed with this format.  Please refer to the patient's chart for her  consent to telehealth for [facility].   Date:  07/14/2019   ID:  Jacqueline [name], DOB 1931-01-04, MRN [id number]  Patient Location: Home Provider Location: Home  PCP:  [name], MD  Cardiologist:  Cornelis Kluver Electrophysiologist:  None   Evaluation Performed:  Follow-Up Visit  Chief Complaint:  F/U CAD, CHF, AFib  History of Present Illness:    Jacqueline [name] is a 85 y.o. female with coronary artery disease and previous bypass surgery (2006), paroxysmal atrial fibrillation requiring previous cardioversion, diastolic heart failure, HTN.  She had protracted episode of persistent atrial fibrillation over the summer on a low-dose of amiodarone.  The dose of amiodarone was increased back to 200 mg daily but before she could undergo cardioversion she presented with rectal bleeding due to hemorrhoids and diverticulosis which required an interruption in her anticoagulants.  She eventually underwent TEE guided cardioversion on July 6.  She felt very poorly for about a month (she was also quite anemic) but has improved substantially since then and feels well.  She continues to take care of her household chores by herself.  She is  carefully self isolating, her son performs grocery shopping for her.  EKG performed on June 08, 2019 showed sinus rhythm with a single PAC.  Dr. Andria Frames stopped her beta-blocker on December 2 due to bradycardia.  She has frequently had elevated blood pressure, but her systolic has been rather labile, often elevated, while her diastolic blood pressure is relatively low.  She specifically denies dyspnea at rest or with activity, orthopnea, PND, edema, palpitations, dizziness or syncope and she has not had angina pectoris.  She is substantially more sedentary in the past due to the coronavirus pandemic.  She has not had focal neurological events, falls or bleeding problems.  The patient does not have symptoms concerning for COVID-19 infection (fever, chills, cough, or new shortness of breath).    Past Medical History:  Diagnosis Date  . Arthritis    back  . Atrial fibrillation with rapid ventricular response ([place]) 02/2014   a. CHA2DS2VASc = 6 -> on eliquis;  b. 02/2014 s/p DCCV;  c. 08/2014 Echo: EF 55-60%, Gr 2 DD, mild MR, triv AI.  Marland Kitchen CAD (coronary artery disease)    a. s/p CABG x 2 (LIMA->LAD, VG->Diag);  b. 08/2014 Cath: LM nl, LAD 90p, LCX nl, RCA nl/dominant, LIMA->LAD atretic, VG->D2 patent w/ retrograde filling of LAD, EF 55-60%-->Med Rx.  . Diverticulitis 02/21/2012  . GERD (gastroesophageal reflux disease)   . Hyperlipemia   . Hypertension   . Insomnia    Past Surgical History:  Procedure Laterality Date  . ABDOMINAL HYSTERECTOMY  1975  . CARDIAC CATHETERIZATION N/A 05/09/2015   Procedure: Right Heart Cath;  Surgeon: Larey Dresser, MD;  Location: [place] CV LAB;  Service: Cardiovascular;  Laterality: N/A;  . CARDIOVERSION N/A 02/28/2014   Procedure: CARDIOVERSION;  Surgeon: Sanda Klein, MD;  Location: [place] ENDOSCOPY;  Service: Cardiovascular;  Laterality: N/A;  . CARDIOVERSION N/A 01/30/2019   Procedure: CARDIOVERSION;  Surgeon: Fay Records, MD;  Location: [facility] ENDOSCOPY;   Service: Cardiovascular;  Laterality: N/A;  . CATARACT EXTRACTION Bilateral 2014   with lens implanted  . COLONOSCOPY N/A 02/19/2015   Procedure: COLONOSCOPY;  Surgeon: Ladene Artist, MD;  Location: [facility] ENDOSCOPY;  Service: Endoscopy;  Laterality: N/A;  . CORONARY ARTERY BYPASS GRAFT  2006   off-pump bypass surgery with LIMA to the LAD and SVG to the second diagonal artery ( performed by Dr Servando Snare)   . JOINT REPLACEMENT Bilateral    L-2004, R-2006  . LEFT HEART CATHETERIZATION WITH CORONARY/GRAFT ANGIOGRAM N/A 09/17/2014   Procedure: LEFT HEART CATHETERIZATION WITH Beatrix Fetters;  Surgeon: Troy Sine, MD;  Location[facility] CATH LAB;  Service: Cardiovascular;  Laterality: N/A;  . REIMPLANTATION OF TOTAL KNEE Right 10/08/2014   Procedure: REIMPLANTATION OF RIGHT TOTAL KNEE ARTHROPLASTY WITH REMOVAL OF ANTIBIOTIC SPACER;  Surgeon: Paralee Cancel, MD;  Location: [facility] ORS;  Service: Orthopedics;  Laterality: Right;  . ROTATOR CUFF REPAIR Bilateral    r-1999, l- 2005  . TEE WITHOUT CARDIOVERSION N/A 02/28/2014   Procedure: TRANSESOPHAGEAL ECHOCARDIOGRAM (TEE);  Surgeon: Sanda Klein, MD;  Location: [place];  Service: Cardiovascular;  Laterality: N/A;  . TEE WITHOUT CARDIOVERSION N/A 01/30/2019   Procedure: TRANSESOPHAGEAL ECHOCARDIOGRAM (TEE);  Surgeon: Fay Records, MD;  Location: [place];  Service: Cardiovascular;  Laterality: N/A;  . TOTAL KNEE ARTHROPLASTY Right 07/02/2014   Procedure: Resection of Infected Right Total Knee Arthroplasty with placement antibiotic spacer;  Surgeon: Mauri Pole, MD;  Location: [facility] ORS;  Service: Orthopedics;  Laterality: Right;     Current Meds  Medication Sig  . acetaminophen (TYLENOL) 500 MG tablet Take 500 mg by mouth every 6 (six) hours as needed for moderate pain (pain/headache.).   Marland Kitchen ALPRAZolam (XANAX) 0.5 MG tablet Take 1 tablet (0.5 mg total) by mouth 2 (two) times daily as needed (anxiety).  Marland Kitchen amiodarone (PACERONE) 200 MG tablet Take 1  tablet (200 mg total) by mouth daily.  Marland Kitchen amLODipine (NORVASC) 2.5 MG tablet Take 1 tablet (2.5 mg total) by mouth at bedtime.  Marland Kitchen apixaban (ELIQUIS) 2.5 MG TABS tablet Take 1 tablet (2.5 mg total) by mouth 2 (two) times daily.  Marland Kitchen gabapentin (NEURONTIN) 100 MG capsule TAKE 1 CAPSULE (100 MG TOTAL) BY MOUTH 3 (THREE) TIMES DAILY.  . iron polysaccharides (NIFEREX) 150 MG capsule Take 1 capsule (150 mg total) by mouth daily.  . Iron-Vitamins (GERITOL COMPLETE PO) Take 1 tablet by mouth daily.  . isosorbide mononitrate (IMDUR) 30 MG 24 hr tablet Take 1 tablet (30 mg total) by mouth daily.  Marland Kitchen levothyroxine (SYNTHROID) 75 MCG tablet TAKE 1 TABLET BY MOUTH EVERY DAY  . meclizine (ANTIVERT) 12.5 MG tablet Take 1 tablet (12.5 mg total) by mouth 3 (three) times daily as needed for dizziness.  . metoprolol succinate (TOPROL-XL) 25 MG 24 hr tablet Take 12.5 mg by mouth daily.  . nitroGLYCERIN (NITROSTAT) 0.4 MG SL tablet PLACE 1 TABLET UNDER THE TONGUE EVERY 5 MINUTES AS NEEDED FOR CHEST PAIN UP TO 3 DOSES (Patient taking differently: Place 0.4 mg under the tongue every 5 (five) minutes x 3 doses as needed for chest pain. PLACE 1 TABLET UNDER THE TONGUE EVERY 5  MINUTES AS NEEDED FOR CHEST PAIN UP TO 3 DOSES)  . Omega-3 Fatty Acids (FISH OIL) 1000 MG CAPS Take 1,000 mg by mouth 2 (two) times a day.   Marland Kitchen omeprazole (PRILOSEC) 20 MG capsule Take 20 mg by mouth daily.  Vladimir Faster Glycol-Propyl Glycol (LUBRICANT EYE DROPS) 0.4-0.3 % SOLN Place 1-2 drops into both eyes 3 (three) times daily as needed (dry/irritated eyes.).  Marland Kitchen polyethylene glycol powder (GLYCOLAX/MIRALAX) 17 GM/SCOOP powder MIX 17 G AND TAKE BY MOUTH DAILY.  Marland Kitchen rOPINIRole (REQUIP) 0.25 MG tablet TAKE 1 TABLET BY MOUTH THREE TIMES A DAY  . simvastatin (ZOCOR) 20 MG tablet TAKE 1 TABLET BY MOUTH EVERYDAY AT BEDTIME (Patient taking differently: Take 20 mg by mouth at bedtime. )  . traMADol (ULTRAM) 50 MG tablet Take 1 tablet (50 mg total) by mouth every 12  (twelve) hours as needed (pain).  . traZODone (DESYREL) 100 MG tablet TAKE 1 TABLET (100 MG TOTAL) BY MOUTH AT BEDTIME.     Allergies:   Atorvastatin, Crestor [rosuvastatin], Morphine and related, and Prednisone   Social History   Tobacco Use  . Smoking status: Never Smoker  . Smokeless tobacco: Never Used  Substance Use Topics  . Alcohol use: No  . Drug use: No     Family Hx: The patient's family history includes Arthritis in her brother; Cancer in her brother; Heart attack in her brother; Parkinson's disease in her mother. There is no history of Breast cancer.  ROS:   Please see the history of present illness.    All other systems are reviewed and are negative.  Prior CV studies:   The following studies were reviewed today:  Notes from visit with Dr. Andria Frames on June 28, 2019  Labs/Other Tests and Data Reviewed:    EKG:  An ECG dated 07/11/2018 was personally reviewed today and demonstrated:  Normal sinus rhythm, no repolarization abnormalities  Recent Labs: 06/08/2019: Hemoglobin 11.0; Platelets 182; TSH 1.320 06/28/2019: ALT 23; BUN 22; Creatinine, Ser 1.69; Potassium 4.6; Sodium 141   Recent Lipid Panel Lab Results  Component Value Date/Time   CHOL 168 07/08/2018 03:18 PM   TRIG 130 07/08/2018 03:18 PM   HDL 58 07/08/2018 03:18 PM   CHOLHDL 2.9 07/08/2018 03:18 PM   CHOLHDL 2.8 04/08/2016 11:53 AM   LDLCALC 84 07/08/2018 03:18 PM    Wt Readings from Last 3 Encounters:  07/14/19 129 lb (58.5 kg)  06/28/19 132 lb (59.9 kg)  06/19/19 133 lb 3.2 oz (60.4 kg)     Objective:    Vital Signs:  BP 139/66   Pulse 66   Ht 5\' 1"  (1.549 m)   Wt 129 lb (58.5 kg)   BMI 24.37 kg/m    VITAL SIGNS:  reviewed Unable to examine.   ASSESSMENT & PLAN:    1. AFib/flutter:  Prolonged symptomatic episode over the summer coinciding with GI bleeding.  Now maintaining sinus rhythm on amiodarone.  CHADSVasc 5 (age 73, gender, CAD, CHF). 2. Amiodarone:  Normal TSH in  November normal liver function tests in December  3. Eliquis: Dose reduced based on age, body size and impaired renal function.  No recurrent bleeding since July.  Most recent hemoglobin 11.0 in November. 4. CAD:  Angina free despite stopping her beta-blocker.  Continues amlodipine and isosorbide. 5. CHF:  Appears to be NYHA functional class I-2, as before limited more by back pain than by her cardiac symptoms. 6. PAH:  Most likely secondary to left heart diastolic failure.  7. CKD: Recent creatinine stable around 1.7 (GFR 25-30). 8. HLP: Target LDL less than 70, she is due a recheck.  Continue statin. 9. HTN:  Has had problems with symptomatic hypotension with orthostasis and low diastolic blood pressures.  I would be satisfied with a systolic blood pressure less than 150 to avoid problems.  COVID-19 Education: The signs and symptoms of COVID-19 were discussed with the patient and how to seek care for testing (follow up with PCP or arrange E-visit).  The importance of social distancing was discussed today.  Time:   Today, I have spent 14 minutes with the patient with telehealth technology discussing the above problems.     Medication Adjustments/Labs and Tests Ordered: Current medicines are reviewed at length with the patient today.  Concerns regarding medicines are outlined above.   Tests Ordered: No orders of the defined types were placed in this encounter.   Medication Changes: No orders of the defined types were placed in this encounter.  There are no Patient Instructions on file for this visit. Disposition:  Follow up December 2020  SignedSanda Klein, MD  07/14/2019 3:50 PM    [place]

## 2019-07-18 ENCOUNTER — Other Ambulatory Visit: Payer: Self-pay | Admitting: Family Medicine

## 2019-07-18 ENCOUNTER — Other Ambulatory Visit: Payer: Self-pay | Admitting: Cardiovascular Disease

## 2019-08-11 ENCOUNTER — Telehealth: Payer: PPO | Admitting: Cardiovascular Disease

## 2019-08-13 ENCOUNTER — Other Ambulatory Visit: Payer: Self-pay | Admitting: Family Medicine

## 2019-08-14 ENCOUNTER — Telehealth: Payer: Self-pay

## 2019-08-14 NOTE — Telephone Encounter (Signed)
Patient calls nurse line inquiring about the shingles vaccine and covid vaccine. Patient stated, "I believe I am too old to go to the [facility] for the covid vaccine." Patient was hoping we were giving them here. I advised her we could send shingles vaccine to her pharmacy, however not the covid vaccine. Patient would like PCP to call her to give his recommendations on traveling to [facility] for covid vaccine.

## 2019-08-15 NOTE — Telephone Encounter (Signed)
I called.  Yes, absolutely take the COVID vaccine.  She does not have computer savvy, so I registered her through [place].  No available appointments.  She is on the waiting list. She will get her shingles vaccine after she has had the COVID vaccine.

## 2019-09-08 DIAGNOSIS — D3131 Benign neoplasm of right choroid: Secondary | ICD-10-CM | POA: Diagnosis not present

## 2019-09-08 DIAGNOSIS — H16223 Keratoconjunctivitis sicca, not specified as Sjogren's, bilateral: Secondary | ICD-10-CM | POA: Diagnosis not present

## 2019-09-08 DIAGNOSIS — H02403 Unspecified ptosis of bilateral eyelids: Secondary | ICD-10-CM | POA: Diagnosis not present

## 2019-09-10 ENCOUNTER — Ambulatory Visit: Payer: PPO | Attending: Internal Medicine

## 2019-09-10 DIAGNOSIS — Z23 Encounter for immunization: Secondary | ICD-10-CM | POA: Insufficient documentation

## 2019-09-10 NOTE — Progress Notes (Signed)
   Covid-19 Vaccination Clinic  Name:  Jacqueline [name]    MRN: [id number] DOB: Oct 19, 1930  09/10/2019  [name] was observed post Covid-19 immunization for 15 minutes without incidence. She was provided with Vaccine Information Sheet and instruction to access the V-Safe system.   [name] was instructed to call 911 with any severe reactions post vaccine: Marland Kitchen Difficulty breathing  . Swelling of your face and throat  . A fast heartbeat  . A bad rash all over your body  . Dizziness and weakness    Immunizations Administered    Name Date Dose VIS Date Route   Pfizer COVID-19 Vaccine 09/10/2019  1:18 PM 0.3 mL 07/07/2019 Intramuscular   Manufacturer: [place]   Lot: [id number]   [place]: [id number]

## 2019-09-22 DIAGNOSIS — H02831 Dermatochalasis of right upper eyelid: Secondary | ICD-10-CM | POA: Diagnosis not present

## 2019-09-22 DIAGNOSIS — H02423 Myogenic ptosis of bilateral eyelids: Secondary | ICD-10-CM | POA: Diagnosis not present

## 2019-09-22 DIAGNOSIS — H02132 Senile ectropion of right lower eyelid: Secondary | ICD-10-CM | POA: Diagnosis not present

## 2019-09-22 DIAGNOSIS — H0279 Other degenerative disorders of eyelid and periocular area: Secondary | ICD-10-CM | POA: Diagnosis not present

## 2019-09-22 DIAGNOSIS — H02413 Mechanical ptosis of bilateral eyelids: Secondary | ICD-10-CM | POA: Diagnosis not present

## 2019-09-22 DIAGNOSIS — H53483 Generalized contraction of visual field, bilateral: Secondary | ICD-10-CM | POA: Diagnosis not present

## 2019-09-22 DIAGNOSIS — H57813 Brow ptosis, bilateral: Secondary | ICD-10-CM | POA: Diagnosis not present

## 2019-09-22 DIAGNOSIS — H02834 Dermatochalasis of left upper eyelid: Secondary | ICD-10-CM | POA: Diagnosis not present

## 2019-09-22 DIAGNOSIS — H02135 Senile ectropion of left lower eyelid: Secondary | ICD-10-CM | POA: Diagnosis not present

## 2019-09-26 DIAGNOSIS — H53483 Generalized contraction of visual field, bilateral: Secondary | ICD-10-CM | POA: Diagnosis not present

## 2019-10-03 ENCOUNTER — Ambulatory Visit: Payer: PPO | Attending: Internal Medicine

## 2019-10-03 DIAGNOSIS — Z23 Encounter for immunization: Secondary | ICD-10-CM | POA: Insufficient documentation

## 2019-10-03 NOTE — Progress Notes (Signed)
   Covid-19 Vaccination Clinic  Name:  Jacqueline [name]    MRN: [id number] DOB: 03-Jul-1931  10/03/2019  Jacqueline [name] was observed post Covid-19 immunization for 15 minutes without incident. She was provided with Vaccine Information Sheet and instruction to access the V-Safe system.   Jacqueline [name] was instructed to call 911 with any severe reactions post vaccine: Marland Kitchen Difficulty breathing  . Swelling of face and throat  . A fast heartbeat  . A bad rash all over body  . Dizziness and weakness   Immunizations Administered    Name Date Dose VIS Date Route   Pfizer COVID-19 Vaccine 10/03/2019  8:20 AM 0.3 mL 07/07/2019 Intramuscular   Manufacturer: [place]   Lot: [id number]   [place]: [id number]

## 2019-10-04 ENCOUNTER — Ambulatory Visit: Payer: PPO

## 2019-10-19 ENCOUNTER — Telehealth: Payer: Self-pay

## 2019-10-19 NOTE — Telephone Encounter (Signed)
   [facility] Pre-operative Risk Assessment    Request for surgical clearance:  1. What type of surgery is being performed? Bilateral upper eyelid blepharoptosis repair   2. When is this surgery scheduled? 11/06/19   3. What type of clearance is required (medical clearance vs. Pharmacy clearance to hold med vs. Both)? Both  4. Are there any medications that need to be held prior to surgery and how long?Eliquis   5. Practice name and name of physician performing surgery? [facility]   6. What is your office phone number [phone]    7.   What is your office fax number [phone]  8.   Anesthesia type (None, local, MAC, general) ? MAC   Jacqueline [name] 10/19/2019, 10:01 AM  _________________________________________________________________   (provider comments below)

## 2019-10-19 NOTE — Telephone Encounter (Signed)
Patient with diagnosis of afib on Eliquis for anticoagulation.    Procedure: Bilateral upper eyelid blepharoptosis repair  Date of procedure: 11/06/19  CHADS2-VASc score of  6 (CHF, HTN, AGE, CAD, AGE, female)  CrCl 22 ml/min  Per office protocol, patient can hold Eliquis for 2-3 days prior to procedure.

## 2019-10-19 NOTE — Telephone Encounter (Signed)
   Primary Cardiologist: Sanda Klein, MD  Chart reviewed as part of pre-operative protocol coverage. Jacqueline [name] has a past medical history significant for CABG in 2006, paroxysmal atrial fibrillation, diastolic heart failure and hypertension. She has a low risk myoview in 08/2017. Patient was contacted 10/19/2019 in reference to pre-operative risk assessment for pending surgery as outlined below.  Jacqueline [name] was last seen on 07/14/19 by Dr. [name].  Since that day, Jacqueline [name] has done well with no cardiac complaints. Even at her advanced age she is still active. She has just been sweeping and mopping without any exertional chest discomfort or shortness of breath. She also denies palpitations, orthopnea, edema, lightheadedness of syncope.   According to the [place] is a Class II risk with 0.9% risk of major cardiac event perioperatively. Her age may give her slight increase in risk but she has good activity tolerance.   Therefore, based on ACC/AHA guidelines, the patient would be at acceptable risk for the planned procedure without further cardiovascular testing.   According to our pharmacy protocol:  Patient with diagnosis of afib on Eliquis for anticoagulation.    CHADS2-VASc score of  6 (CHF, HTN, AGE, CAD, AGE, female) CrCl 22 ml/min  Per office protocol, patient can hold Eliquis for 2-3 days prior to procedure.     I will route this recommendation to the requesting party via Epic fax function and remove from pre-op pool.  Please call with questions.  Jacqueline Perch, NP 10/19/2019, 2:08 PM

## 2019-10-23 NOTE — Telephone Encounter (Signed)
I discussed with pt and she will call surgeon's office.  They did not have our recommendations when they told her 6 days.

## 2019-10-23 NOTE — Telephone Encounter (Signed)
Patient states she is unsure how many days she is supposed to hold her eliquis. She says she was told by our office to hold it for 3 days prior, but the office doing her surgery told her 6 days.

## 2019-10-31 ENCOUNTER — Other Ambulatory Visit: Payer: Self-pay | Admitting: Family Medicine

## 2019-11-06 ENCOUNTER — Telehealth: Payer: Self-pay | Admitting: Cardiovascular Disease

## 2019-11-06 DIAGNOSIS — H02135 Senile ectropion of left lower eyelid: Secondary | ICD-10-CM | POA: Diagnosis not present

## 2019-11-06 DIAGNOSIS — H0279 Other degenerative disorders of eyelid and periocular area: Secondary | ICD-10-CM | POA: Diagnosis not present

## 2019-11-06 DIAGNOSIS — H02423 Myogenic ptosis of bilateral eyelids: Secondary | ICD-10-CM | POA: Diagnosis not present

## 2019-11-06 DIAGNOSIS — H57813 Brow ptosis, bilateral: Secondary | ICD-10-CM | POA: Diagnosis not present

## 2019-11-06 DIAGNOSIS — H02132 Senile ectropion of right lower eyelid: Secondary | ICD-10-CM | POA: Diagnosis not present

## 2019-11-06 DIAGNOSIS — H02834 Dermatochalasis of left upper eyelid: Secondary | ICD-10-CM | POA: Diagnosis not present

## 2019-11-06 DIAGNOSIS — H53483 Generalized contraction of visual field, bilateral: Secondary | ICD-10-CM | POA: Diagnosis not present

## 2019-11-06 DIAGNOSIS — H02831 Dermatochalasis of right upper eyelid: Secondary | ICD-10-CM | POA: Diagnosis not present

## 2019-11-06 DIAGNOSIS — H02413 Mechanical ptosis of bilateral eyelids: Secondary | ICD-10-CM | POA: Diagnosis not present

## 2019-11-06 NOTE — Telephone Encounter (Signed)
Spoke with Vaughan Basta, patient's sister. She was advised by surgeon to resume Eliquis on Wednesday (held for 3 days prior to eye surgery that was today). Advised that generally the surgeon will advise when safe to resume med, since they know the extent of procedure, blood loss, if there were any complications.   Will made MD aware and contact them if he advises otherwise

## 2019-11-06 NOTE — Telephone Encounter (Signed)
Pt c/o medication issue:  1. Name of Medication: apixaban (ELIQUIS) 2.5 MG TABS tablet  2. How are you currently taking this medication (dosage and times per day)? Pt has not taken for 3 days.   3. Are you having a reaction (difficulty breathing--STAT)? no  4. What is your medication issue? Pt was told to hold her medication for 3 days prior to her eye surgery. She had her surgery at 2:00 today and wanted to know when to re-start the medication. Please advise

## 2019-11-06 NOTE — Telephone Encounter (Signed)
I agree that the eye surgeon should decide when it is safe to restart the Eliquis.  Okay to resume from my point of view.

## 2019-11-06 NOTE — Telephone Encounter (Signed)
Patient/sister aware that MD advised to follow surgeon's recs for when to resume Eliquis

## 2019-11-16 ENCOUNTER — Telehealth: Payer: Self-pay | Admitting: Internal Medicine

## 2019-11-16 ENCOUNTER — Telehealth: Payer: Self-pay | Admitting: Medical

## 2019-11-16 NOTE — Telephone Encounter (Signed)
   Patient called the after hours line with concerns for elevated heart rates. She reports HR was in the 60s this morning. She felt a little fluttering in her chest this evening which occurred without associated symptoms. She rechecked her vitals and HR was elevated to the 110s. She is no longer on any AV nodal blocking agents due to bradycardia. She no longer has any metoprolol in her house. BP has been stable in the 100s/50s. She reports taking a SL nitro to help with the fluttering - educated her on appropriate SL nitro use. HR was 90 at the time of my call. Possible she has had recurrent atrial fib/flutter. Historically she has been unaware of her arrhythmias. She is without chest pain or SOB. Favor watchful waiting at this time. Will send a message to our schedulers to try to get an interval appointment to follow-up on recent tachypalpitations. She is scheduled to see Dr. [name] 12/2019. She was appreciative of the call and in agreement with the plan.   Abigail Butts, PA-C 11/16/19; 8:20 PM

## 2019-11-16 NOTE — Telephone Encounter (Signed)
Cardiology Moonlighter Note  Returned page from patient. She states that she was already contacted about this issue earlier and has no further questions or issues.   Marcie Mowers, MD Cardiology Fellow, PGY-7

## 2019-11-20 ENCOUNTER — Telehealth: Payer: Self-pay | Admitting: Cardiovascular Disease

## 2019-11-20 DIAGNOSIS — D3131 Benign neoplasm of right choroid: Secondary | ICD-10-CM | POA: Diagnosis not present

## 2019-11-20 DIAGNOSIS — H02403 Unspecified ptosis of bilateral eyelids: Secondary | ICD-10-CM | POA: Diagnosis not present

## 2019-11-20 DIAGNOSIS — H16223 Keratoconjunctivitis sicca, not specified as Sjogren's, bilateral: Secondary | ICD-10-CM | POA: Diagnosis not present

## 2019-11-20 DIAGNOSIS — H16211 Exposure keratoconjunctivitis, right eye: Secondary | ICD-10-CM | POA: Diagnosis not present

## 2019-11-20 MED ORDER — APIXABAN 2.5 MG PO TABS: 2.5000 mg | ORAL_TABLET | Freq: Two times a day (BID) | ORAL | 1 refills | Status: DC

## 2019-11-20 NOTE — Telephone Encounter (Signed)
89 F 59.9 kg, SCr 1.69 (06/2019), LOV Cro[place]u 06/2019

## 2019-11-20 NOTE — Telephone Encounter (Signed)
*  STAT* If patient is at the pharmacy, call can be transferred to refill team.   1. Which medications need to be refilled? (please list name of each medication and dose if known) apixaban (ELIQUIS) 2.5 MG TABS tablet  2. Which pharmacy/location (including street and city if local pharmacy) is medication to be sent to? [id number] [place], [place] - [address]  3. Do they need a 30 day or 90 day supply? [name]

## 2019-11-21 ENCOUNTER — Other Ambulatory Visit: Payer: Self-pay | Admitting: Cardiovascular Disease

## 2019-11-21 ENCOUNTER — Other Ambulatory Visit: Payer: Self-pay | Admitting: Family Medicine

## 2019-11-21 DIAGNOSIS — I10 Essential (primary) hypertension: Secondary | ICD-10-CM

## 2019-11-21 DIAGNOSIS — K219 Gastro-esophageal reflux disease without esophagitis: Secondary | ICD-10-CM

## 2019-11-24 ENCOUNTER — Other Ambulatory Visit: Payer: Self-pay | Admitting: Family Medicine

## 2019-11-24 DIAGNOSIS — D3131 Benign neoplasm of right choroid: Secondary | ICD-10-CM | POA: Diagnosis not present

## 2019-11-24 DIAGNOSIS — H02403 Unspecified ptosis of bilateral eyelids: Secondary | ICD-10-CM | POA: Diagnosis not present

## 2019-11-24 DIAGNOSIS — H16223 Keratoconjunctivitis sicca, not specified as Sjogren's, bilateral: Secondary | ICD-10-CM | POA: Diagnosis not present

## 2019-11-24 DIAGNOSIS — H16211 Exposure keratoconjunctivitis, right eye: Secondary | ICD-10-CM | POA: Diagnosis not present

## 2019-11-27 DIAGNOSIS — H02403 Unspecified ptosis of bilateral eyelids: Secondary | ICD-10-CM | POA: Diagnosis not present

## 2019-11-27 DIAGNOSIS — H16211 Exposure keratoconjunctivitis, right eye: Secondary | ICD-10-CM | POA: Diagnosis not present

## 2019-11-27 DIAGNOSIS — H16223 Keratoconjunctivitis sicca, not specified as Sjogren's, bilateral: Secondary | ICD-10-CM | POA: Diagnosis not present

## 2019-11-27 DIAGNOSIS — D3131 Benign neoplasm of right choroid: Secondary | ICD-10-CM | POA: Diagnosis not present

## 2019-12-11 DIAGNOSIS — H16223 Keratoconjunctivitis sicca, not specified as Sjogren's, bilateral: Secondary | ICD-10-CM | POA: Diagnosis not present

## 2019-12-11 DIAGNOSIS — H16211 Exposure keratoconjunctivitis, right eye: Secondary | ICD-10-CM | POA: Diagnosis not present

## 2019-12-11 DIAGNOSIS — D3131 Benign neoplasm of right choroid: Secondary | ICD-10-CM | POA: Diagnosis not present

## 2019-12-11 DIAGNOSIS — H02403 Unspecified ptosis of bilateral eyelids: Secondary | ICD-10-CM | POA: Diagnosis not present

## 2019-12-13 ENCOUNTER — Ambulatory Visit (INDEPENDENT_AMBULATORY_CARE_PROVIDER_SITE_OTHER): Payer: PPO | Admitting: Family Medicine

## 2019-12-13 ENCOUNTER — Other Ambulatory Visit: Payer: Self-pay

## 2019-12-13 VITALS — BP 112/50 | HR 60 | Wt 131.8 lb

## 2019-12-13 DIAGNOSIS — I10 Essential (primary) hypertension: Secondary | ICD-10-CM

## 2019-12-13 DIAGNOSIS — F4329 Adjustment disorder with other symptoms: Secondary | ICD-10-CM | POA: Diagnosis not present

## 2019-12-13 DIAGNOSIS — R531 Weakness: Secondary | ICD-10-CM | POA: Diagnosis not present

## 2019-12-13 DIAGNOSIS — R251 Tremor, unspecified: Secondary | ICD-10-CM | POA: Diagnosis not present

## 2019-12-13 DIAGNOSIS — E785 Hyperlipidemia, unspecified: Secondary | ICD-10-CM | POA: Diagnosis not present

## 2019-12-13 DIAGNOSIS — E039 Hypothyroidism, unspecified: Secondary | ICD-10-CM | POA: Diagnosis not present

## 2019-12-13 MED ORDER — ALPRAZOLAM 0.25 MG PO TABS: 0.2500 mg | ORAL_TABLET | Freq: Two times a day (BID) | ORAL | 0 refills | Status: DC | PRN

## 2019-12-13 MED ORDER — PROPRANOLOL HCL 40 MG PO TABS: 40.0000 mg | ORAL_TABLET | Freq: Two times a day (BID) | ORAL | 3 refills | Status: DC

## 2019-12-13 MED ORDER — ISOSORBIDE MONONITRATE ER 30 MG PO TB24: 15.0000 mg | ORAL_TABLET | Freq: Every day | ORAL | 3 refills | Status: DC

## 2019-12-13 NOTE — Assessment & Plan Note (Signed)
Will check TSH since patient continues on amiodarone.

## 2019-12-13 NOTE — Patient Instructions (Addendum)
It was nice meeting you today Ms. Tregre!  We are starting a medicine called propranolol for your essential tremor.  Please take this twice daily.  Please make sure you are not taking metoprolol anymore.  I have taken this off of your medication list.  Please stop taking your amlodipine and cut your Imdur in half.  Your blood pressure is too low currently.  If your top number stays below 150, that will be good, but we do not want it to be too low.  Please cut your alprazolam in half and take 1/2 tablet twice daily for the next 2 weeks.  Then, take half tablet only before bedtime for the next 2 weeks.  Then stop this medication.  It can increase your risk for falls, but we cannot stop it all of a sudden.  We are checking several labs today.  I will be in touch regarding your results.  Please consider physical therapy in the future to help with your feeling of unsteadiness.  If you have any questions or concerns, please feel free to call the clinic.   Be well,  Dr. Latrelle Dodrill Exercise  The sit-to-stand exercise (also known as the chair stand or chair rise exercise) strengthens your lower body and helps you maintain or improve your mobility and independence. The goal is to do the sit-to-stand exercise without using your hands. This will be easier as you become stronger. You should always talk with your health care provider before starting any exercise program, especially if you have had recent surgery. Do the exercise exactly as told by your health care provider and adjust it as directed. It is normal to feel mild stretching, pulling, tightness, or discomfort as you do this exercise, but you should stop right away if you feel sudden pain or your pain gets worse. Do not begin doing this exercise until told by your health care provider. What the sit-to-stand exercise does The sit-to-stand exercise helps to strengthen the muscles in your thighs and the muscles in the center of your body  that give you stability (core muscles). This exercise is especially helpful if:  You have had knee or hip surgery.  You have trouble getting up from a chair, out of a car, or off the toilet. How to do the sit-to-stand exercise 1. Sit toward the front edge of a sturdy chair without armrests. Your knees should be bent and your feet should be flat on the floor and shoulder-width apart. 2. Place your hands lightly on each side of the seat. Keep your back and neck as straight as possible, with your chest slightly forward. 3. Breathe in slowly. Lean forward and slightly shift your weight to the front of your feet. 4. Breathe out as you slowly stand up. Use your hands as little as possible. 5. Stand and pause for a full breath in and out. 6. Breathe in as you sit down slowly. Tighten your core and abdominal muscles to control your lowering as much as possible. 7. Breathe out slowly. 8. Do this exercise 10-15 times. If needed, do it fewer times until you build up strength. 9. Rest for 1 minute, then do another set of 10-15 repetitions. To change the difficulty of the sit-to-stand exercise  If the exercise is too difficult, use a chair with sturdy armrests, and push off the armrests to help you come to the standing position. You can also use the armrests to help slowly lower yourself back to sitting. As this gets easier,  try to use your arms less. You can also place a firm cushion or pillow on the chair to make the surface higher.  If this exercise is too easy, do not use your arms to help raise or lower yourself. You can also wear a weighted vest, use hand weights, increase your repetitions, or try a lower chair. General tips  You may feel tired when starting an exercise routine. This is normal.  You may have muscle soreness that lasts a few days. This is normal. As you get stronger, you may not feel muscle soreness.  Use smooth, steady movements.  Do not  hold your breath during strength  exercises. This can cause unsafe changes in your blood pressure.  Breathe in slowly through your nose, and breathe out slowly through your mouth. Summary  Strengthening your lower body is an important step to help you move safely and independently.  The sit-to-stand exercise helps strengthen the muscles in your thighs and core.  You should always talk with your health care provider before starting any exercise program, especially if you have had recent surgery. This information is not intended to replace advice given to you by your health care provider. Make sure you discuss any questions you have with your health care provider. Document Revised: 05/11/2018 Document Reviewed: 09/03/2016 Elsevier Patient Education  [place].

## 2019-12-13 NOTE — Assessment & Plan Note (Signed)
Appears to be most consistent with essential tremor on exam today.  We will start the lowest dose of propranolol that is effective for essential tremor, which is 40 mg twice daily.  Confirmed that patient no longer takes metoprolol.

## 2019-12-13 NOTE — Assessment & Plan Note (Signed)
Will obtain lipid panel today.

## 2019-12-13 NOTE — Progress Notes (Signed)
    SUBJECTIVE:   CHIEF COMPLAINT / HPI:   Tremor Patient reports that she is having tremor of her bilateral hands.  She says that she has a tremor whether she is doing something or not and her right hand seems slightly worse than the left.  She says that her mother had Parkinson's disease, so she is worried that she may have this as well.  She has not yet tried anything for relief so far.  She denies any mood changes, stiffness.  Weakness, dizziness Patient reports that she continues to have weakness and dizziness when she is walking.  She says that this has been a problem for over a year.  She uses a cane for ambulation and has more dizziness when she rises from a seated position.  She no longer takes meclizine or metoprolol.  She has not had any falls.  She continues to take Xanax 0.5 mg twice daily, but says that it is not helpful for her.  Blood pressure Patient reports that her blood pressure has been about our measurement here and she takes it daily at home.  She has had 1 measurement where her systolic blood pressure was 154, but usually her systolic measurements are around 741, and her diastolic measurements are around 50 or 60.  PERTINENT  PMH / PSH: Atrial fibrillation, CHF NYHA Class II, PAH, tachy-brady syndrome, hypothyroidism, osteoporosis CKD, back pain  OBJECTIVE:   BP (!) 112/50   Pulse 60   Wt 131 lb 12.8 oz (59.8 kg)   SpO2 99%   BMI 24.90 kg/m   General: well appearing, appears stated age Cardiac: Regular rhythm, normal rate, no Respiratory: CTAB, no rhonchi, rales, or wheezing, normal work of breathing Skin: no rashes or other lesions, warm and well perfused Psych: appropriate mood and affect Neuro: No pill-rolling tremor, but mild tremor of the bilateral upper extremities is present at rest and with movement.  Gait is slow but not shuffling.  No cogwheel rigidity, no masked facies.   ASSESSMENT/PLAN:   Dyslipidemia Will obtain lipid panel  today.  Hypothyroidism Will check TSH since patient continues on amiodarone.  Weakness generalized Will check vitamin B12 level, TSH, CBC, BMP.  These labs are reassuring in the past, but it has been at least 6 months, so we will repeat today.  Weakness may be worsened by patient's hypotension.  We will stop amlodipine 2.5 mg and have the Imdur.  Patient takes no other blood pressure medications.  We will also wean down on the alprazolam and eventually stop it within 1 month.  Offered physical therapy, but patient would like to hold off on this since she requires a ride to physical therapy currently.  Also recommended that she do the sit to stand exercise and gave her a handout on this since deconditioning is likely a component of her symptoms.  Tremor Appears to be most consistent with essential tremor on exam today.  We will start the lowest dose of propranolol that is effective for essential tremor, which is 40 mg twice daily.  Confirmed that patient no longer takes metoprolol.   Advised patient to follow-up in about 1 month with her PCP, Dr. Andria Frames.  Kathrene Alu, MD [place]

## 2019-12-13 NOTE — Assessment & Plan Note (Addendum)
Will check vitamin B12 level, TSH, CBC, BMP.  These labs are reassuring in the past, but it has been at least 6 months, so we will repeat today.  Weakness may be worsened by patient's hypotension.  We will stop amlodipine 2.5 mg and have the Imdur.  Patient takes no other blood pressure medications.  We will also wean down on the alprazolam and eventually stop it within 1 month.  Offered physical therapy, but patient would like to hold off on this since she requires a ride to physical therapy currently.  Also recommended that she do the sit to stand exercise and gave her a handout on this since deconditioning is likely a component of her symptoms.

## 2019-12-14 LAB — BASIC METABOLIC PANEL
BUN/Creatinine Ratio: 12 (ref 12–28)
BUN: 20 mg/dL (ref 8–27)
CO2: 21 mmol/L (ref 20–29)
Calcium: 9.7 mg/dL (ref 8.7–10.3)
Chloride: 101 mmol/L (ref 96–106)
Creatinine, Ser: 1.66 mg/dL — ABNORMAL HIGH (ref 0.57–1.00)
GFR calc Af Amer: 32 mL/min/{1.73_m2} — ABNORMAL LOW (ref >59)
GFR calc non Af Amer: 28 mL/min/{1.73_m2} — ABNORMAL LOW (ref >59)
Glucose: 66 mg/dL (ref 65–99)
Potassium: 4.4 mmol/L (ref 3.5–5.2)
Sodium: 139 mmol/L (ref 134–144)

## 2019-12-14 LAB — LIPID PANEL
Chol/HDL Ratio: 2.2 ratio (ref 0.0–4.4)
Cholesterol, Total: 135 mg/dL (ref 100–199)
HDL: 62 mg/dL (ref >39)
LDL Chol Calc (NIH): 52 mg/dL (ref 0–99)
Triglycerides: 116 mg/dL (ref 0–149)
VLDL Cholesterol Cal: 21 mg/dL (ref 5–40)

## 2019-12-14 LAB — VITAMIN B12: Vitamin B-12: 513 pg/mL (ref 232–1245)

## 2019-12-14 LAB — CBC
Hematocrit: 36.3 % (ref 34.0–46.6)
Hemoglobin: 11.5 g/dL (ref 11.1–15.9)
MCH: 31.3 pg (ref 26.6–33.0)
MCHC: 31.7 g/dL (ref 31.5–35.7)
MCV: 99 fL — ABNORMAL HIGH (ref 79–97)
Platelets: 180 10*3/uL (ref 150–450)
RBC: 3.68 x10E6/uL — ABNORMAL LOW (ref 3.77–5.28)
RDW: 13.1 % (ref 11.7–15.4)
WBC: 4.2 10*3/uL (ref 3.4–10.8)

## 2019-12-14 LAB — TSH: TSH: 1.19 u[IU]/mL (ref 0.450–4.500)

## 2019-12-14 NOTE — Progress Notes (Signed)
Patient called and informed of her lab results.  All of her results are either stable or within normal range, no changes needed.

## 2019-12-15 ENCOUNTER — Telehealth: Payer: Self-pay | Admitting: Cardiovascular Disease

## 2019-12-15 ENCOUNTER — Telehealth: Payer: Self-pay | Admitting: Family Medicine

## 2019-12-15 NOTE — Telephone Encounter (Addendum)
Called patient to check in. BP 130/70, HR still in the high 50s.  She is asymptomatic at this time and feels well.  I again reiterated patient to not take propanolol or amlodipine over the weekend.  She is to call if her heart rate drops below 50 this weekend or if she has other symptoms.  Reviewed reasons to present to the emergency room including symptomatic hypotension, chest pain or dyspnea.  All questions answered.  Dorris Singh, MD  Family Medicine Teaching Service

## 2019-12-15 NOTE — Telephone Encounter (Signed)
Pt calling today w/ c/o hypotension and low HR. Recently she saw her PCP, who manages her HTN medications, and was switched from amlodipine to propranolol. She states this was made to help manage her tremors as well.   I advised her to call her PCP's office for recommendation. Dr. [name] is out of the office. Dr. Shan Levans manages most of her medications and would be appropriate to contact.   I will forward this phone message to Dr. Shan Levans as well.

## 2019-12-15 NOTE — Telephone Encounter (Signed)
Called patient to discuss medications.  She reports she feels a bit weak. No dizziness upon standing or falls. BP 107/56, HR 56. She has felt unwell since starting propanolol. Likely symptomatic from lower HR and BP.   Discontinue propanolol. Recommended ER evaluation today if symptoms not improved. Follow up next week scheduled with Dr. Tarry Kos. Recommend discontinuing rather than starting any new agents.  Will check in on patient later today to ensure improvement in symptoms.  Dorris Singh, MD  Family Medicine Teaching Service

## 2019-12-15 NOTE — Telephone Encounter (Signed)
New message  Pt c/o BP issue: STAT if pt c/o blurred vision, one-sided weakness or slurred speech  1. What are your last 5 BP readings? 107/49 46 hr   2. Are you having any other symptoms (ex. Dizziness, headache, blurred vision, passed out)?  Weak   3. What is your BP issue? Patient states that her b/p is low.

## 2019-12-16 ENCOUNTER — Telehealth: Payer: Self-pay | Admitting: Family Medicine

## 2019-12-16 NOTE — Telephone Encounter (Signed)
Patient (with her son) called in regards to her same concern as when she called yesterday, namely that she has been bradycardic and feeling weak.  After discussing with her I determined that the weakness is a chronic issue, although she states over the last 3 days (since Wednesday) when she saw the provider in our clinic it has been slightly worse.  She is still able to walk with a cane, rise from a seated position, and has not had any falls.  Her bradycardia has been measured at home in the 50s and 40s.  The lowest was 43.  Prior to that a log book was kept that showed she was consistently in the 56s.  Yesterday, Dr. Owens Shark informed the patient not to take any propranolol which she had been prescribed on Wednesday.  Also on Wednesday she was told not to take amlodipine so she has not been taking them.  She does continue to take her amiodarone for A. fib.  She took it this morning.  She has not taken the propranolol since yesterday with the amlodipine since Wednesday.  I advised the patient to take the bottle of propranolol and search her pillboxes for any additional propanolol pills, take them, and either throw them away or hide them so that she does not accidentally ingest them.  I then told her to check her pulse tomorrow before she takes her amiodarone.  If her pulse is above 55 I told her she can take the amiodarone.  If it is 28 or below, I advised her to call us before taking the amiodarone.  At that point we can decide whether she needs to come to the emergency department based on her symptoms and pulse, or if she can follow-up in our clinic early next week.  Amiodarone is only contraindicated in severe bradycardia so patient should be fine if she takes the medication with a pulse above 55.  Advise the patient to come in sooner to the ED if she experiences any falls, inability to ambulate or worsening ambulation, incontinence, lower extremity numbness.  It appears the propranolol has triggered  bradycardia, but I am not sure why the effects are still in her system.  She is may need another day or so for to fully metabolize.  We will forward to PCP.  Clemetine Marker, MD

## 2019-12-18 ENCOUNTER — Telehealth: Payer: Self-pay | Admitting: Family Medicine

## 2019-12-18 DIAGNOSIS — H16223 Keratoconjunctivitis sicca, not specified as Sjogren's, bilateral: Secondary | ICD-10-CM | POA: Diagnosis not present

## 2019-12-18 DIAGNOSIS — D3131 Benign neoplasm of right choroid: Secondary | ICD-10-CM | POA: Diagnosis not present

## 2019-12-18 DIAGNOSIS — H16211 Exposure keratoconjunctivitis, right eye: Secondary | ICD-10-CM | POA: Diagnosis not present

## 2019-12-18 DIAGNOSIS — H02403 Unspecified ptosis of bilateral eyelids: Secondary | ICD-10-CM | POA: Diagnosis not present

## 2019-12-18 NOTE — Telephone Encounter (Signed)
Returned Jacqueline [name]'s call regarding her low blood pressure and heart rate.  She says that she stopped the propranolol on Friday, and she has felt better since then.  She says that her pulse today was 65.  She has been taking Imdur 30 mg/day, and I advised her to try cutting this pill in half as we discussed during her visit with me.  I also reviewed with her that she should continue her amiodarone but hold her amlodipine.  If her tremors continue to bother her enough that she would like to try half of the propranolol dose, she may discuss this with her PCP, Dr. Andria Frames, at her follow-up appointment on 6/9.  Encouraged her to call if she develops any other concerning symptoms.  She expressed understanding and agreement with this plan.

## 2019-12-19 ENCOUNTER — Ambulatory Visit: Payer: PPO

## 2019-12-26 ENCOUNTER — Telehealth: Payer: Self-pay

## 2019-12-26 NOTE — Telephone Encounter (Signed)
Patient calls nurse line stating she missed a call from our office. I do not see any notes, unsure if call was a reminder for next weeks apt. Will forward to PCP to see if he reached out to patient. Patient does state she is feeling much better from last week.

## 2019-12-26 NOTE — Telephone Encounter (Signed)
Called, feeling better.  Labeled chart as intollerant to Beta blockers.

## 2019-12-26 NOTE — Telephone Encounter (Signed)
Called and LM to call if still symptomatic.  I am aware she has an appointment to see me 6/9

## 2020-01-02 ENCOUNTER — Other Ambulatory Visit: Payer: Self-pay | Admitting: Family Medicine

## 2020-01-02 DIAGNOSIS — F4329 Adjustment disorder with other symptoms: Secondary | ICD-10-CM

## 2020-01-03 ENCOUNTER — Other Ambulatory Visit: Payer: Self-pay

## 2020-01-03 ENCOUNTER — Ambulatory Visit (INDEPENDENT_AMBULATORY_CARE_PROVIDER_SITE_OTHER): Payer: PPO | Admitting: Family Medicine

## 2020-01-03 ENCOUNTER — Encounter: Payer: Self-pay | Admitting: Family Medicine

## 2020-01-03 DIAGNOSIS — I10 Essential (primary) hypertension: Secondary | ICD-10-CM | POA: Diagnosis not present

## 2020-01-03 MED ORDER — ZOSTER VAC RECOMB ADJUVANTED 50 MCG/0.5ML IM SUSR: 0.5000 mL | Freq: Once | INTRAMUSCULAR | 1 refills | Status: AC

## 2020-01-03 NOTE — Patient Instructions (Addendum)
I want you to make an appointment with Dr. Valentina Lucks when you will have transportation 2 days in a row.  He will set you up for 24 hour blood pressure monitoring.  You have been up and down in the office and I want to see averages rather than yo-yo with each reading.   I will call you after I see the results.  No changes for now.   I sent in a prescription for the shingles vaccine.

## 2020-01-04 ENCOUNTER — Encounter: Payer: Self-pay | Admitting: Family Medicine

## 2020-01-04 ENCOUNTER — Ambulatory Visit: Payer: PPO | Admitting: Pharmacist

## 2020-01-04 NOTE — Assessment & Plan Note (Signed)
Because of discrepancy between her home BP reads and the elevated BP here, will schedule for 24 hour BP monitor before making any adjustments.

## 2020-01-04 NOTE — Progress Notes (Signed)
° ° °  SUBJECTIVE:   CHIEF COMPLAINT / HPI:  FU Hypertension and anxiety.   Hypertension:  Systolic BP up today.  States home BP measurements are good.  I am unsure which to believe.  HBP is primarily systolic.  I need to watch for low diastolics.  We stopped her beta blocker due to bradycardia.  Bradycardia and lightheadedness have resolved.  Otherwise feels great    OBJECTIVE:   BP (!) 172/84    Pulse 62    Ht 5\' 1"  (1.549 m)    Wt 131 lb 12.8 oz (59.8 kg)    SpO2 96%    BMI 24.90 kg/m   Confirmed elevated systolic BP. Lungs clear Cardiac RRR without m or g  ASSESSMENT/PLAN:   Hypertension Because of discrepancy between her home BP reads and the elevated BP here, will schedule for 24 hour BP monitor before making any adjustments.     Jacqueline Resides, MD [place]

## 2020-01-08 DIAGNOSIS — H16211 Exposure keratoconjunctivitis, right eye: Secondary | ICD-10-CM | POA: Diagnosis not present

## 2020-01-08 DIAGNOSIS — H16223 Keratoconjunctivitis sicca, not specified as Sjogren's, bilateral: Secondary | ICD-10-CM | POA: Diagnosis not present

## 2020-01-08 DIAGNOSIS — H02403 Unspecified ptosis of bilateral eyelids: Secondary | ICD-10-CM | POA: Diagnosis not present

## 2020-01-08 DIAGNOSIS — D3131 Benign neoplasm of right choroid: Secondary | ICD-10-CM | POA: Diagnosis not present

## 2020-01-09 ENCOUNTER — Telehealth: Payer: Self-pay | Admitting: Family Medicine

## 2020-01-09 ENCOUNTER — Ambulatory Visit: Payer: PPO | Admitting: Pharmacist

## 2020-01-09 NOTE — Telephone Encounter (Signed)
Patient had questions about whether she really needed the test.  I encouraged her and she is willing to keep the pharmacy appointment.

## 2020-01-09 NOTE — Telephone Encounter (Signed)
Pt stated the heart monitor the doctor recommended has been cancelled twice, this Monday and last Monday/. Pls call her [phone]

## 2020-01-11 ENCOUNTER — Encounter: Payer: Self-pay | Admitting: Cardiovascular Disease

## 2020-01-11 ENCOUNTER — Telehealth: Payer: Self-pay | Admitting: *Deleted

## 2020-01-11 ENCOUNTER — Telehealth (INDEPENDENT_AMBULATORY_CARE_PROVIDER_SITE_OTHER): Payer: PPO | Admitting: Cardiovascular Disease

## 2020-01-11 VITALS — BP 165/70 | HR 58 | Ht 61.0 in | Wt 127.0 lb

## 2020-01-11 DIAGNOSIS — I25728 Atherosclerosis of autologous artery coronary artery bypass graft(s) with other forms of angina pectoris: Secondary | ICD-10-CM | POA: Diagnosis not present

## 2020-01-11 DIAGNOSIS — I5032 Chronic diastolic (congestive) heart failure: Secondary | ICD-10-CM

## 2020-01-11 DIAGNOSIS — Z79899 Other long term (current) drug therapy: Secondary | ICD-10-CM

## 2020-01-11 DIAGNOSIS — I480 Paroxysmal atrial fibrillation: Secondary | ICD-10-CM

## 2020-01-11 DIAGNOSIS — E7800 Pure hypercholesterolemia, unspecified: Secondary | ICD-10-CM | POA: Diagnosis not present

## 2020-01-11 DIAGNOSIS — Z7901 Long term (current) use of anticoagulants: Secondary | ICD-10-CM | POA: Diagnosis not present

## 2020-01-11 DIAGNOSIS — I2721 Secondary pulmonary arterial hypertension: Secondary | ICD-10-CM | POA: Diagnosis not present

## 2020-01-11 DIAGNOSIS — I10 Essential (primary) hypertension: Secondary | ICD-10-CM

## 2020-01-11 DIAGNOSIS — Z5181 Encounter for therapeutic drug level monitoring: Secondary | ICD-10-CM

## 2020-01-11 DIAGNOSIS — N184 Chronic kidney disease, stage 4 (severe): Secondary | ICD-10-CM | POA: Diagnosis not present

## 2020-01-11 NOTE — Progress Notes (Signed)
Virtual Visit via Telephone Note   This visit type was conducted due to national recommendations for restrictions regarding the COVID-19 Pandemic (e.g. social distancing) in an effort to limit this patient's exposure and mitigate transmission in our community.  Due to her co-morbid illnesses, this patient is at least at moderate risk for complications without adequate follow up.  This format is felt to be most appropriate for this patient at this time.  The patient did not have access to video technology/had technical difficulties with video requiring transitioning to audio format only (telephone).  All issues noted in this document were discussed and addressed.  No physical exam could be performed with this format.  Please refer to the patient's chart for her  consent to telehealth for [facility].   Date:  01/11/2020   ID:  Jacqueline [name], DOB May 03, 1931, MRN [id number]  Patient Location: Home Provider Location: Home  PCP:  [name], MD  Cardiologist:  Lon Klippel Electrophysiologist:  None   Evaluation Performed:  Follow-Up Visit  Chief Complaint:  F/U CAD, CHF, AFib  History of Present Illness:    Jacqueline [name] is a 86 y.o. female with coronary artery disease and previous bypass surgery (2006), paroxysmal atrial fibrillation requiring previous cardioversion, diastolic heart failure, HTN.  She is done quite well since her last appointment and has not had any symptomatic episodes of atrial fibrillation.  She has not had angina or dyspnea at rest or with activity.  She continues to live independently with a support of her son who lives across the yard.  She has had a lot of problems with vision in her right eye where she has a "scab" on the cornea due to dry eye and needed surgery.  She is being followed by [facility] ophthalmology.  The patient specifically denies any chest pain at rest exertion, dyspnea at rest or with exertion, orthopnea, paroxysmal nocturnal dyspnea,  syncope, palpitations, focal neurological deficits, intermittent claudication, lower extremity edema, unexplained weight gain, cough, hemoptysis or wheezing.  She has not had any falls or injuries or bleeding problems.  Her blood pressure remains quite volatile.  Today her blood pressure is 165/70, but yesterday was 128/52.  Dr. Andria Frames has scheduled her for a 24-hour blood pressure monitor.  She does have a history of symptomatic hypotension.  She complains of lack of energy.  She has not had any new problems with anemia and her hemoglobin was stable at 11.5 in May.  She also had an excellent lipid profile and normal TSH level.  Her liver function tests were last checked in December.  Dr. Andria Frames stopped her beta-blocker on June 27 2018, due to bradycardia.  She has frequently had elevated blood pressure, but her systolic has been rather labile, often elevated, while her diastolic blood pressure is relatively low.  The patient does not have symptoms concerning for COVID-19 infection (fever, chills, cough, or new shortness of breath).    Past Medical History:  Diagnosis Date  . Arthritis    back  . Atrial fibrillation with rapid ventricular response ([place]) 02/2014   a. CHA2DS2VASc = 6 -> on eliquis;  b. 02/2014 s/p DCCV;  c. 08/2014 Echo: EF 55-60%, Gr 2 DD, mild MR, triv AI.  Marland Kitchen CAD (coronary artery disease)    a. s/p CABG x 2 (LIMA->LAD, VG->Diag);  b. 08/2014 Cath: LM nl, LAD 90p, LCX nl, RCA nl/dominant, LIMA->LAD atretic, VG->D2 patent w/ retrograde filling of LAD, EF 55-60%-->Med Rx.  . Diverticulitis 02/21/2012  .  GERD (gastroesophageal reflux disease)   . Hyperlipemia   . Hypertension   . Insomnia    Past Surgical History:  Procedure Laterality Date  . ABDOMINAL HYSTERECTOMY  1975  . CARDIAC CATHETERIZATION N/A 05/09/2015   Procedure: Right Heart Cath;  Surgeon: Larey Dresser, MD;  Location: [place] CV LAB;  Service: Cardiovascular;  Laterality: N/A;  . CARDIOVERSION N/A  02/28/2014   Procedure: CARDIOVERSION;  Surgeon: Sanda Klein, MD;  Location: [place] ENDOSCOPY;  Service: Cardiovascular;  Laterality: N/A;  . CARDIOVERSION N/A 01/30/2019   Procedure: CARDIOVERSION;  Surgeon: Fay Records, MD;  Location: [facility] ENDOSCOPY;  Service: Cardiovascular;  Laterality: N/A;  . CATARACT EXTRACTION Bilateral 2014   with lens implanted  . COLONOSCOPY N/A 02/19/2015   Procedure: COLONOSCOPY;  Surgeon: Ladene Artist, MD;  Location: [facility] ENDOSCOPY;  Service: Endoscopy;  Laterality: N/A;  . CORONARY ARTERY BYPASS GRAFT  2006   off-pump bypass surgery with LIMA to the LAD and SVG to the second diagonal artery ( performed by Dr Servando Snare)   . JOINT REPLACEMENT Bilateral    L-2004, R-2006  . LEFT HEART CATHETERIZATION WITH CORONARY/GRAFT ANGIOGRAM N/A 09/17/2014   Procedure: LEFT HEART CATHETERIZATION WITH Beatrix Fetters;  Surgeon: Troy Sine, MD;  Location: [facility] CATH LAB;  Service: Cardiovascular;  Laterality: N/A;  . REIMPLANTATION OF TOTAL KNEE Right 10/08/2014   Procedure: REIMPLANTATION OF RIGHT TOTAL KNEE ARTHROPLASTY WITH REMOVAL OF ANTIBIOTIC SPACER;  Surgeon: Paralee Cancel, MD;  Location: [facility] ORS;  Service: Orthopedics;  Laterality: Right;  . ROTATOR CUFF REPAIR Bilateral    r-1999, l- 2005  . TEE WITHOUT CARDIOVERSION N/A 02/28/2014   Procedure: TRANSESOPHAGEAL ECHOCARDIOGRAM (TEE);  Surgeon: Sanda Klein, MD;  Location: [place];  Service: Cardiovascular;  Laterality: N/A;  . TEE WITHOUT CARDIOVERSION N/A 01/30/2019   Procedure: TRANSESOPHAGEAL ECHOCARDIOGRAM (TEE);  Surgeon: Fay Records, MD;  Location: [place];  Service: Cardiovascular;  Laterality: N/A;  . TOTAL KNEE ARTHROPLASTY Right 07/02/2014   Procedure: Resection of Infected Right Total Knee Arthroplasty with placement antibiotic spacer;  Surgeon: Mauri Pole, MD;  Location: [facility] ORS;  Service: Orthopedics;  Laterality: Right;     Current Meds  Medication Sig  . acetaminophen (TYLENOL) 500 MG  tablet Take 500 mg by mouth every 6 (six) hours as needed for moderate pain (pain/headache.).   Marland Kitchen ALPRAZolam (XANAX) 0.5 MG tablet TAKE 1 TABLET BY MOUTH TWICE A DAY AS NEEDED FOR ANXIETY  . amiodarone (PACERONE) 200 MG tablet Take 1 tablet (200 mg total) by mouth daily.  Marland Kitchen apixaban (ELIQUIS) 2.5 MG TABS tablet Take 1 tablet (2.5 mg total) by mouth 2 (two) times daily.  Marland Kitchen gabapentin (NEURONTIN) 100 MG capsule TAKE 1 CAPSULE (100 MG TOTAL) BY MOUTH 3 (THREE) TIMES DAILY.  . isosorbide mononitrate (IMDUR) 30 MG 24 hr tablet Take 0.5 tablets (15 mg total) by mouth daily.  Marland Kitchen levothyroxine (SYNTHROID) 75 MCG tablet TAKE 1 TABLET BY MOUTH EVERY DAY  . nitroGLYCERIN (NITROSTAT) 0.4 MG SL tablet PLACE 1 TABLET UNDER THE TONGUE EVERY 5 MINUTES AS NEEDED FOR CHEST PAIN UP TO 3 DOSES (Patient taking differently: Place 0.4 mg under the tongue every 5 (five) minutes x 3 doses as needed for chest pain. PLACE 1 TABLET UNDER THE TONGUE EVERY 5 MINUTES AS NEEDED FOR CHEST PAIN UP TO 3 DOSES)  . Omega-3 Fatty Acids (FISH OIL) 1000 MG CAPS Take 1,000 mg by mouth 2 (two) times a day.   Marland Kitchen omeprazole (PRILOSEC) 20 MG  capsule TAKE 1 CAPSULE BY MOUTH EVERY DAY  . Polyethyl Glycol-Propyl Glycol (LUBRICANT EYE DROPS) 0.4-0.3 % SOLN Place 1-2 drops into both eyes 3 (three) times daily as needed (dry/irritated eyes.).  Marland Kitchen polyethylene glycol powder (GLYCOLAX/MIRALAX) 17 GM/SCOOP powder MIX 17 G AND TAKE BY MOUTH DAILY.  Marland Kitchen rOPINIRole (REQUIP) 0.25 MG tablet TAKE 1 TABLET BY MOUTH THREE TIMES A DAY  . simvastatin (ZOCOR) 20 MG tablet TAKE 1 TABLET BY MOUTH EVERYDAY AT BEDTIME  . traMADol (ULTRAM) 50 MG tablet TAKE 1 TABLET BY MOUTH EVERY 12 HOURS AS NEEDED FOR PAIN  . traZODone (DESYREL) 100 MG tablet TAKE 1 TABLET BY MOUTH EVERYDAY AT BEDTIME     Allergies:   Atorvastatin, Crestor [rosuvastatin], Morphine and related, Beta adrenergic blockers, and Prednisone   Social History   Tobacco Use  . Smoking status: Never Smoker    . Smokeless tobacco: Never Used  Vaping Use  . Vaping Use: Never used  Substance Use Topics  . Alcohol use: No  . Drug use: No     Family Hx: The patient's family history includes Arthritis in her brother; Cancer in her brother; Heart attack in her brother; Parkinson's disease in her mother. There is no history of Breast cancer.  ROS:   Please see the history of present illness.    All other systems are reviewed and are negative.  Prior CV studies:   The following studies were reviewed today:  Notes from visit with Dr. Andria Frames on January 03, 2020  Labs/Other Tests and Data Reviewed:    EKG:  An ECG dated 06/08/2019 was personally reviewed today and demonstrated:  NSR with PACs  Recent Labs: 06/28/2019: ALT 23 12/13/2019: BUN 20; Creatinine, Ser 1.66; Hemoglobin 11.5; Platelets 180; Potassium 4.4; Sodium 139; TSH 1.190   Recent Lipid Panel Lab Results  Component Value Date/Time   CHOL 135 12/13/2019 11:02 AM   TRIG 116 12/13/2019 11:02 AM   HDL 62 12/13/2019 11:02 AM   CHOLHDL 2.2 12/13/2019 11:02 AM   CHOLHDL 2.8 04/08/2016 11:53 AM   LDLCALC 52 12/13/2019 11:02 AM    Wt Readings from Last 3 Encounters:  01/11/20 127 lb (57.6 kg)  01/03/20 131 lb 12.8 oz (59.8 kg)  12/13/19 131 lb 12.8 oz (59.8 kg)     Objective:    Vital Signs:  BP (!) 165/70   Pulse (!) 58   Ht 5\' 1"  (1.549 m)   Wt 127 lb (57.6 kg)   BMI 24.00 kg/m    VITAL SIGNS:  reviewed Unable to examine.   ASSESSMENT & PLAN:    1. AFib/flutter:  Its been by years since she has had any clinically symptomatic events.  Previous decompensation occurred when we tried to cut back on her dose of amiodarone 100 mg daily. CHADSVasc 5 (age 57, gender, CAD, CHF). 2. Amiodarone:  Related.  Recent normal TSH.  Needs to have liver function tests with next blood draw.. 3. Eliquis: On adjusted lower dose due to age small body habitus and abnormal creatinine.  No serious bleeding problems since the GI bleeding about a  year ago.  Hemoglobin stable in normal range. 4. CAD:  Angina free on amlodipine and isosorbide.  Beta-blockers stopped due to bradycardia.  Not on aspirin due to full anticoagulation. 5. CHF:  As far as I can tell on a phone interview, she is well compensated and euvolemic, NYHA functional class I 6. PAH:  Probably due to diastolic left heart failure. 7. CKD 4: Creatinine  at baseline 1.66 (GFR 25-30). 8. HLP: On recent lipid profile.  Continue statin. 9. HTN:  Still having very volatile systolic blood pressure, but her diastolic blood pressure is always in normal range and sometimes quite low.  Scheduled for 24-hour monitoring by Dr. Patsey Berthold.   COVID-19 Education: The signs and symptoms of COVID-19 were discussed with the patient and how to seek care for testing (follow up with PCP or arrange E-visit).  The importance of social distancing was discussed today.  Time:   Today, I have spent 22 minutes with the patient with telehealth technology discussing the above problems.     Medication Adjustments/Labs and Tests Ordered: Current medicines are reviewed at length with the patient today.  Concerns regarding medicines are outlined above.   Tests Ordered: No orders of the defined types were placed in this encounter.   Medication Changes: No orders of the defined types were placed in this encounter.  Patient Instructions  Medication Instructions:  No change *If you need a refill on your cardiac medications before your next appointment, please call your pharmacy*   Lab Work: None ordered If you have labs (blood work) drawn today and your tests are completely normal, you will receive your results only by: Marland Kitchen MyChart Message (if you have MyChart) OR . A paper copy in the mail If you have any lab test that is abnormal or we need to change your treatment, we will call you to review the results.   Testing/Procedures: None ordered   Follow-Up: At [facility], you and your health  needs are our priority.  As part of our continuing mission to provide you with exceptional heart care, we have created designated Provider Care Teams.  These Care Teams include your primary Cardiologist (physician) and Advanced Practice Providers (APPs -  Physician Assistants and Nurse Practitioners) who all work together to provide you with the care you need, when you need it.  We recommend signing up for the patient portal called "MyChart".  Sign up information is provided on this After Visit Summary.  MyChart is used to connect with patients for Virtual Visits (Telemedicine).  Patients are able to view lab/test results, encounter notes, upcoming appointments, etc.  Non-urgent messages can be sent to your provider as well.   To learn more about what you can do with MyChart, go to [email or web address].    Your next appointment:   6 month(s)  The format for your next appointment:   In Person  Provider:   You may see Sanda Klein, MD or one of the following Advanced Practice Providers on your designated Care Team:    Almyra Deforest, PA-C  Fabian Sharp, [place] or   Roby Lofts, [place]     Disposition:  Follow up December 2020  Signed, Sanda Klein, MD  01/11/2020 9:42 AM    [place]

## 2020-01-11 NOTE — Patient Instructions (Signed)
Medication Instructions:  No change *If you need a refill on your cardiac medications before your next appointment, please call your pharmacy*   Lab Work: None ordered If you have labs (blood work) drawn today and your tests are completely normal, you will receive your results only by: Marland Kitchen MyChart Message (if you have MyChart) OR . A paper copy in the mail If you have any lab test that is abnormal or we need to change your treatment, we will call you to review the results.   Testing/Procedures: None ordered   Follow-Up: At [facility], you and your health needs are our priority.  As part of our continuing mission to provide you with exceptional heart care, we have created designated Provider Care Teams.  These Care Teams include your primary Cardiologist (physician) and Advanced Practice Providers (APPs -  Physician Assistants and Nurse Practitioners) who all work together to provide you with the care you need, when you need it.  We recommend signing up for the patient portal called "MyChart".  Sign up information is provided on this After Visit Summary.  MyChart is used to connect with patients for Virtual Visits (Telemedicine).  Patients are able to view lab/test results, encounter notes, upcoming appointments, etc.  Non-urgent messages can be sent to your provider as well.   To learn more about what you can do with MyChart, go to [email or web address].    Your next appointment:   6 month(s)  The format for your next appointment:   In Person  Provider:   You may see Sanda Klein, MD or one of the following Advanced Practice Providers on your designated Care Team:    Almyra Deforest, PA-C  Fabian Sharp, PA-C or   Roby Lofts, [place]

## 2020-01-11 NOTE — Telephone Encounter (Signed)
  Patient Consent for Virtual Visit         [name] has provided verbal consent on 01/11/2020 for a virtual visit (video or telephone).   CONSENT FOR VIRTUAL VISIT FOR:  Jacqueline [name]  By participating in this virtual visit I agree to the following:  I hereby voluntarily request, consent and authorize [place] and its employed or contracted physicians, physician assistants, nurse practitioners or other licensed health care professionals (the Practitioner), to provide me with telemedicine health care services (the "Services") as deemed necessary by the treating Practitioner. I acknowledge and consent to receive the Services by the Practitioner via telemedicine. I understand that the telemedicine visit will involve communicating with the Practitioner through live audiovisual communication technology and the disclosure of certain medical information by electronic transmission. I acknowledge that I have been given the opportunity to request an in-person assessment or other available alternative prior to the telemedicine visit and am voluntarily participating in the telemedicine visit.  I understand that I have the right to withhold or withdraw my consent to the use of telemedicine in the course of my care at any time, without affecting my right to future care or treatment, and that the Practitioner or I may terminate the telemedicine visit at any time. I understand that I have the right to inspect all information obtained and/or recorded in the course of the telemedicine visit and may receive copies of available information for a reasonable fee.  I understand that some of the potential risks of receiving the Services via telemedicine include:  Marland Kitchen Delay or interruption in medical evaluation due to technological equipment failure or disruption; . Information transmitted may not be sufficient (e.g. poor resolution of images) to allow for appropriate medical decision making by the  Practitioner; and/or  . In rare instances, security protocols could fail, causing a breach of personal health information.  Furthermore, I acknowledge that it is my responsibility to provide information about my medical history, conditions and care that is complete and accurate to the best of my ability. I acknowledge that Practitioner's advice, recommendations, and/or decision may be based on factors not within their control, such as incomplete or inaccurate data provided by me or distortions of diagnostic images or specimens that may result from electronic transmissions. I understand that the practice of medicine is not an exact science and that Practitioner makes no warranties or guarantees regarding treatment outcomes. I acknowledge that a copy of this consent can be made available to me via my patient portal ([facility]), or I can request a printed copy by calling the office of [place].    I understand that my insurance will be billed for this visit.   I have read or had this consent read to me. . I understand the contents of this consent, which adequately explains the benefits and risks of the Services being provided via telemedicine.  . I have been provided ample opportunity to ask questions regarding this consent and the Services and have had my questions answered to my satisfaction. . I give my informed consent for the services to be provided through the use of telemedicine in my medical care

## 2020-01-15 ENCOUNTER — Other Ambulatory Visit: Payer: Self-pay

## 2020-01-15 ENCOUNTER — Ambulatory Visit (INDEPENDENT_AMBULATORY_CARE_PROVIDER_SITE_OTHER): Payer: PPO | Admitting: Pharmacist

## 2020-01-15 DIAGNOSIS — I10 Essential (primary) hypertension: Secondary | ICD-10-CM | POA: Diagnosis not present

## 2020-01-15 NOTE — Patient Instructions (Addendum)
Wearing the Blood Pressure Monitor  The cuff will inflate every 20 minutes during the day and every 30 minutes while you sleep.  Your blood pressure readings will NOT display after cuff inflation  Fill out the blood pressure-activity diary during the day, especially during activities that may affect your reading -- such as exercise, stress, walking, taking your blood pressure medications  Important things to know:  Avoid taking the monitor off for the next 24 hours, unless it causes you discomfort or pain.  Do NOT get the monitor wet and do NOT dry to clean the monitor with any cleaning products.  Do NOT put the monitor on anyone else's arm.  When the cuff inflates, avoid excess movement. Let the cuffed arm hang loosely, slightly away from the body. Avoid flexing the muscles or moving the hand/fingers.  When you go to sleep, make sure that the hose is not kinked.  Remember to fill out the blood pressure activity diary.  If you experience severe pain or unusual pain (not associated with getting your blood pressure checked), remove the monitor.  Troubleshooting:  Code  Troubleshooting   1  Check cuff position, tighten cuff   2, 3  Remain still during reading   4, 87  Check air hose connections and make sure cuff is tight   85, 89  Check hose connections and make tubing is not crimped   86  Push START/STOP to restart reading   88, 91  Retry by pushing START/STOP   90  Replace batteries. If problem persists, remove monitor and bring back to   clinic at follow up   97, 98, 99  Service required - Remove monitor and bring back to clinic at follow up   Blood Pressure Activity Diary Time Lying down/ Sleeping Walking/ Exercise Stressed/ Angry Headache/ Pain Dizzy  9 AM       10 AM       11 AM       12 PM       1 PM       2 PM       Time Lying down/ Sleeping Walking/ Exercise Stressed/ Angry Headache/ Pain Dizzy  3 PM       4 PM        5 PM       6 PM       7 PM       8 PM        Time Lying down/ Sleeping Walking/ Exercise Stressed/ Angry Headache/ Pain Dizzy  9 PM       10 PM       11 PM       12 AM       1 AM       2 AM       3 AM       Time Lying down/ Sleeping Walking/ Exercise Stressed/ Angry Headache/ Pain Dizzy  4 AM       5 AM       6 AM       7 AM       8 AM       9 AM       10 AM        Time you woke up: _________                  Time you went to sleep:__________    Come back tomorrow at 8:30AM to have the   the monitor removed  Call the [facility] if you have any questions before then [phone])   01/16/2020  Results reviewed - no changes to medication therapy.

## 2020-01-15 NOTE — Progress Notes (Signed)
     S:    Patient arrives in good spirits, ambulating with use of cane. Presents to the clinic for ambulatory blood pressure evaluation.   Patient was referred and last seen by Primary Care Provider on 01/03/2020.   Patient has had diagnosis of hypertension for at least 9-10 years.    Medication compliance is reported to be good.  Discussed procedure for wearing the monitor and gave patient written instructions. Monitor was placed on non-dominant arm with instructions to return in the morning.   Current BP Medications include:  None  Antihypertensives tried in the past include: Amlodipine, hydrochlorothiazide, metoprolol    O:   Physical Exam Constitutional:      Appearance: Normal appearance. She is normal weight.  Pulmonary:     Effort: Pulmonary effort is normal.  Neurological:     Mental Status: She is alert.  Psychiatric:        Mood and Affect: Mood normal.        Behavior: Behavior normal.        Thought Content: Thought content normal.        Judgment: Judgment normal.      Review of Systems  Neurological: Positive for weakness.    Last 3 Office BP readings: BP Readings from Last 3 Encounters:  01/15/20 128/80  01/11/20 (!) 165/70  01/03/20 (!) 172/84     Basic Metabolic Panel    Component Value Date/Time   NA 139 12/13/2019 1102   K 4.4 12/13/2019 1102   CL 101 12/13/2019 1102   CO2 21 12/13/2019 1102   GLUCOSE 66 12/13/2019 1102   GLUCOSE 99 01/30/2019 0633   BUN 20 12/13/2019 1102   CREATININE 1.66 (H) 12/13/2019 1102   CREATININE 1.48 (H) 04/08/2016 1153   CALCIUM 9.7 12/13/2019 1102   GFRNONAA 28 (L) 12/13/2019 1102   GFRNONAA 33 (L) 04/08/2016 1153   GFRAA 32 (L) 12/13/2019 1102   GFRAA 37 (L) 04/08/2016 1153     Today's Office Blood Pressure (BP) reading: 128/80 mmHg (manual reading)  ABPM Study Data: Arm Placement left arm   Overall Mean 24hr BP:   139/61 mmHg HR: 57   Daytime Mean BP:  139/63 mmHg HR: 60   Nighttime Mean  BP:  138/56 mmHg HR: 50   Dipping Pattern: No.  Sys:   0.5%   Dia: 10.9%   [normal dipping ~10-20%]  Non-hypertensive ABPM thresholds: daytime BP <125/75 mmHg, sleeptime BP <120/70 mmHg     A/P: Patient has long-standing hypertension, but not currently on any antihypertensive medications. 24-hour ambulatory blood pressure demonstrates isolated systolic BP elevation, with average blood pressure of 139/81 mmHg. Highest readings were just before office visit, indicating potential for white coat hypertension. Nocturnal dipping pattern is abnormal (patient is not a dipper). Overall, BP is appropriate given patient's age. Diastolic BP is relatively low, so do not want to add therapies that may make patient orthostatic. No changes to medications suggested at this time.     Results reviewed and written information provided.  Total time in face-to-face counseling 30 minutes.   F/U Clinic Visit with Dr. Valentina Lucks.  Patient seen with Esmeralda Links, PharmD Candidate; Richardine Service, PharmD PGY-1 Resident; Domingo Pulse, PharmD Candidate.

## 2020-01-16 ENCOUNTER — Ambulatory Visit: Payer: PPO | Admitting: Pharmacist

## 2020-01-16 NOTE — Assessment & Plan Note (Signed)
Patient has long-standing hypertension, but not currently on any antihypertensive medications. 24-hour ambulatory blood pressure demonstrates isolated systolic BP elevation, with average blood pressure of 139/81 mmHg. Highest readings were just before office visit, indicating potential for white coat hypertension. Nocturnal dipping pattern is abnormal (patient is not a dipper). Overall, BP is appropriate given patient's age. Diastolic BP is relatively low, so do not want to add therapies that may make patient orthostatic. No changes to medications suggested at this time.

## 2020-01-27 NOTE — Progress Notes (Signed)
Reviewed: I agree with Dr. Koval's documentation and management. 

## 2020-02-12 DIAGNOSIS — H16431 Localized vascularization of cornea, right eye: Secondary | ICD-10-CM | POA: Diagnosis not present

## 2020-02-15 DIAGNOSIS — H04123 Dry eye syndrome of bilateral lacrimal glands: Secondary | ICD-10-CM | POA: Diagnosis not present

## 2020-02-15 DIAGNOSIS — M3501 Sicca syndrome with keratoconjunctivitis: Secondary | ICD-10-CM | POA: Diagnosis not present

## 2020-02-15 DIAGNOSIS — H1711 Central corneal opacity, right eye: Secondary | ICD-10-CM | POA: Diagnosis not present

## 2020-02-15 DIAGNOSIS — H16231 Neurotrophic keratoconjunctivitis, right eye: Secondary | ICD-10-CM | POA: Diagnosis not present

## 2020-02-20 DIAGNOSIS — Z09 Encounter for follow-up examination after completed treatment for conditions other than malignant neoplasm: Secondary | ICD-10-CM | POA: Diagnosis not present

## 2020-02-20 DIAGNOSIS — H16231 Neurotrophic keratoconjunctivitis, right eye: Secondary | ICD-10-CM | POA: Diagnosis not present

## 2020-02-26 DIAGNOSIS — H04123 Dry eye syndrome of bilateral lacrimal glands: Secondary | ICD-10-CM | POA: Diagnosis not present

## 2020-02-26 DIAGNOSIS — Z09 Encounter for follow-up examination after completed treatment for conditions other than malignant neoplasm: Secondary | ICD-10-CM | POA: Diagnosis not present

## 2020-02-26 DIAGNOSIS — H16231 Neurotrophic keratoconjunctivitis, right eye: Secondary | ICD-10-CM | POA: Diagnosis not present

## 2020-03-04 DIAGNOSIS — H16231 Neurotrophic keratoconjunctivitis, right eye: Secondary | ICD-10-CM | POA: Diagnosis not present

## 2020-03-12 DIAGNOSIS — H16223 Keratoconjunctivitis sicca, not specified as Sjogren's, bilateral: Secondary | ICD-10-CM | POA: Diagnosis not present

## 2020-03-12 DIAGNOSIS — H04123 Dry eye syndrome of bilateral lacrimal glands: Secondary | ICD-10-CM | POA: Diagnosis not present

## 2020-03-12 DIAGNOSIS — Z961 Presence of intraocular lens: Secondary | ICD-10-CM | POA: Diagnosis not present

## 2020-03-12 DIAGNOSIS — M3501 Sicca syndrome with keratoconjunctivitis: Secondary | ICD-10-CM | POA: Diagnosis not present

## 2020-04-07 ENCOUNTER — Telehealth: Payer: Self-pay | Admitting: Physician Assistant

## 2020-04-07 MED ORDER — HYDRALAZINE HCL 25 MG PO TABS: ORAL_TABLET | ORAL | 1 refills | Status: DC

## 2020-04-07 NOTE — Telephone Encounter (Signed)
Patient called for elevated BP. She was on vacation last week with family and since then has intermittent elevation of blood pressure 117/78. Sometime 126/70. HR always in 60-70s. She reports SOB and dizziness. No chest pain, orthopnea, PND, LE edema, cough, congestion, fever or chills. Prior hx of labile blood pressure. She cann't tell if she is out of rhythm or not. No COVID exposure.   She will try PRN hydralazine and see response. Will need OV next week.

## 2020-04-08 ENCOUNTER — Telehealth: Payer: Self-pay

## 2020-04-08 DIAGNOSIS — R42 Dizziness and giddiness: Secondary | ICD-10-CM

## 2020-04-08 NOTE — Telephone Encounter (Signed)
Patient calls nurse line requesting same day appointment for EKG. Patient states that she has been having issues with her BP. Patient had talked to cardiologist yesterday, who started patient on hydralazine.   Patient is already scheduled with cardiology on Wednesday afternoon. We do not have any openings until Wednesday as well. Advised patient to keep appointment with Cardiologist and provided with ED precautions.   To PCP  [name], RN

## 2020-04-09 NOTE — Progress Notes (Signed)
Cardiology Clinic Note   Patient Name: Jacqueline [name] Date of Encounter: 04/10/2020  Primary Care Provider:  [name], MD Primary Cardiologist:  Sanda Klein, MD  Patient Profile    Jacqueline [name] 86 year old female presents to the clinic today for an evaluation of her labile blood pressure.  Past Medical History    Past Medical History:  Diagnosis Date  . Arthritis    back  . Atrial fibrillation with rapid ventricular response ([place]) 02/2014   a. CHA2DS2VASc = 6 -> on eliquis;  b. 02/2014 s/p DCCV;  c. 08/2014 Echo: EF 55-60%, Gr 2 DD, mild MR, triv AI.  Marland Kitchen CAD (coronary artery disease)    a. s/p CABG x 2 (LIMA->LAD, VG->Diag);  b. 08/2014 Cath: LM nl, LAD 90p, LCX nl, RCA nl/dominant, LIMA->LAD atretic, VG->D2 patent w/ retrograde filling of LAD, EF 55-60%-->Med Rx.  . Diverticulitis 02/21/2012  . GERD (gastroesophageal reflux disease)   . Hyperlipemia   . Hypertension   . Insomnia    Past Surgical History:  Procedure Laterality Date  . ABDOMINAL HYSTERECTOMY  1975  . CARDIAC CATHETERIZATION N/A 05/09/2015   Procedure: Right Heart Cath;  Surgeon: Larey Dresser, MD;  Location: [place] [facility] LAB;  Service: Cardiovascular;  Laterality: N/A;  . CARDIOVERSION N/A 02/28/2014   Procedure: CARDIOVERSION;  Surgeon: Sanda Klein, MD;  Location: [place] ENDOSCOPY;  Service: Cardiovascular;  Laterality: N/A;  . CARDIOVERSION N/A 01/30/2019   Procedure: CARDIOVERSION;  Surgeon: Fay Records, MD;  Location: [facility] ENDOSCOPY;  Service: Cardiovascular;  Laterality: N/A;  . CATARACT EXTRACTION Bilateral 2014   with lens implanted  . COLONOSCOPY N/A 02/19/2015   Procedure: COLONOSCOPY;  Surgeon: Ladene Artist, MD;  Location: [facility] ENDOSCOPY;  Service: Endoscopy;  Laterality: N/A;  . CORONARY ARTERY BYPASS GRAFT  2006   off-pump bypass surgery with LIMA to the LAD and SVG to the second diagonal artery ( performed by Dr Servando Snare)   . JOINT REPLACEMENT Bilateral    L-2004, R-2006  . LEFT  HEART CATHETERIZATION WITH CORONARY/GRAFT ANGIOGRAM N/A 09/17/2014   Procedure: LEFT HEART CATHETERIZATION WITH Beatrix Fetters;  Surgeon: Troy Sine, MD;  Location: [facility] CATH LAB;  Service: Cardiovascular;  Laterality: N/A;  . REIMPLANTATION OF TOTAL KNEE Right 10/08/2014   Procedure: REIMPLANTATION OF RIGHT TOTAL KNEE ARTHROPLASTY WITH REMOVAL OF ANTIBIOTIC SPACER;  Surgeon: Paralee Cancel, MD;  Location: [facility] ORS;  Service: Orthopedics;  Laterality: Right;  . ROTATOR CUFF REPAIR Bilateral    r-1999, l- 2005  . TEE WITHOUT CARDIOVERSION N/A 02/28/2014   Procedure: TRANSESOPHAGEAL ECHOCARDIOGRAM (TEE);  Surgeon: Sanda Klein, MD;  Location: [place];  Service: Cardiovascular;  Laterality: N/A;  . TEE WITHOUT CARDIOVERSION N/A 01/30/2019   Procedure: TRANSESOPHAGEAL ECHOCARDIOGRAM (TEE);  Surgeon: Fay Records, MD;  Location: [place];  Service: Cardiovascular;  Laterality: N/A;  . TOTAL KNEE ARTHROPLASTY Right 07/02/2014   Procedure: Resection of Infected Right Total Knee Arthroplasty with placement antibiotic spacer;  Surgeon: Mauri Pole, MD;  Location: [facility] ORS;  Service: Orthopedics;  Laterality: Right;    Allergies  Allergies  Allergen Reactions  . Atorvastatin Other (See Comments)    Myalgias in legs  . Beta Adrenergic Blockers Other (See Comments)    Severe bradycardia  . Crestor [Rosuvastatin] Other (See Comments)    Myalgias in legs  . Morphine And Related Other (See Comments)    "Crazy thoughts, severe headache, sick"  . Prednisone     On two occasions has caused  A fib    History of Present Illness    Jacqueline [name] has a past medical history of hypertension, paroxysmal atrial fibrillation, tachybradycardia syndrome, pulmonary artery hypertension, chronic diastolic CHF, atherosclerosis of abdominal aorta, GERD, hypothyroidism, lower GI bleed, osteoporosis, CKD stage IV, dyslipidemia, anemia, transaminitis, frequent falls, tremor, and CABG 2006.  She was last seen  by Dr. [name] on 6/21. During that time she was asymptomatic and had not noticed any recent episodes of A. fib. She denied chest pain and dyspnea. She continued to live independently with support from her son who live nearby. She was having vision problems with her right eye and indicated that she needed to have surgery. She was followed by [facility] ophthalmology for this. She was noted to have labile blood pressure with her blood pressures ranging from 165/70 and at varying times in the 120s over 50s. She underwent a 24-hour blood pressure monitor with Dr. Valentina Lucks which showed acceptable blood pressures ranging in the 139/81 range. No changes in her medication were suggested at that time.  Patient called our after-hours triage line due to elevated blood pressure, 04/07/20, while she was on family vacation. Her blood pressures at that time were 126/70 with a heart rate of 60s-70s. She reported shortness of breath and dizziness. She denied chest pain orthopnea PND lower extremity edema cough and Covid exposures. She was prescribed as needed's on hydralazine. Parameters were given to take 1 tablet for systolic blood pressure above 160 and up to 2 tablets/day.  She presents to the clinic today for an evaluation of her labile blood pressure. She was recently placed on hydralazine. She states she had elevated blood pressure this morning 170s over 70s-80s.  She took 1 hydralazine which lowered her blood pressure.  Her blood pressure in the clinic today is 142/74.  She states over the last 2 weeks she has noticed increased DOE and shortness of breath.  She has also noticed intermittent blood pressures in the 200s over 117s.  She also indicates that her blood pressure at times is 100s over 60s-70s.  She indicates that she is using pain [place] salt on her food.  We discussed the importance of avoiding all types of salt including processed food in her diet.  I will change the frequency of her hydralazine so that she  may take it 3 times daily instead of twice daily.  I will also order an echocardiogram for further evaluation and have her follow-up in 1 month.  Today she denies chest pain, shortness of breath, lower extremity edema, fatigue, palpitations, melena, hematuria, hemoptysis, diaphoresis, weakness, presyncope, syncope, orthopnea, and PND.   Home Medications    Prior to Admission medications   Medication Sig Start Date End Date Taking? Authorizing Provider  acetaminophen (TYLENOL) 500 MG tablet Take 500 mg by mouth every 6 (six) hours as needed for moderate pain (pain/headache.).     [provider]  ALPRAZolam Duanne Moron) 0.5 MG tablet TAKE 1 TABLET BY MOUTH TWICE A DAY AS NEEDED FOR ANXIETY 01/02/20   Hensel, Jamal Collin, MD  amiodarone (PACERONE) 200 MG tablet Take 1 tablet (200 mg total) by mouth daily. 06/08/19   [name], MD  apixaban (ELIQUIS) 2.5 MG TABS tablet Take 1 tablet (2.5 mg total) by mouth 2 (two) times daily. 11/20/19   Croitoru, Mihai, MD  cholecalciferol (VITAMIN D3) 25 MCG (1000 UNIT) tablet Take 1,000 Units by mouth daily.    [provider]  gabapentin (NEURONTIN) 100 MG capsule TAKE 1  CAPSULE (100 MG TOTAL) BY MOUTH 3 (THREE) TIMES DAILY. 02/20/19   [name], MD  hydrALAZINE (APRESOLINE) 25 MG tablet Take one tablet for SBP above 160 as needed up to 2 tablet per day. 04/07/20   Bhagat, Crista Luria, PA  isosorbide mononitrate (IMDUR) 30 MG 24 hr tablet Take 0.5 tablets (15 mg total) by mouth daily. Patient taking differently: Take 30 mg by mouth daily.  12/13/19   Kathrene Alu, MD  levothyroxine (SYNTHROID) 75 MCG tablet TAKE 1 TABLET BY MOUTH EVERY DAY 05/03/19   [name], MD  Multiple Vitamin (MULTIVITAMIN WITH MINERALS) TABS tablet Take 1 tablet by mouth daily.    [provider]  nitroGLYCERIN (NITROSTAT) 0.4 MG SL tablet PLACE 1 TABLET UNDER THE TONGUE EVERY 5 MINUTES AS NEEDED FOR CHEST PAIN UP TO 3 DOSES Patient taking  differently: Place 0.4 mg under the tongue every 5 (five) minutes x 3 doses as needed for chest pain. PLACE 1 TABLET UNDER THE TONGUE EVERY 5 MINUTES AS NEEDED FOR CHEST PAIN UP TO 3 DOSES 10/20/18   Croitoru, Mihai, MD  Omega-3 Fatty Acids (FISH OIL) 1000 MG CAPS Take 1,000 mg by mouth 2 (two) times a day.     [provider]  omeprazole (PRILOSEC) 20 MG capsule TAKE 1 CAPSULE BY MOUTH EVERY DAY 11/22/19   Hensel, Jamal Collin, MD  Polyethyl Glycol-Propyl Glycol (LUBRICANT EYE DROPS) 0.4-0.3 % SOLN Place 1-2 drops into both eyes 3 (three) times daily as needed (dry/irritated eyes.).    [provider]  polyethylene glycol powder (GLYCOLAX/MIRALAX) 17 GM/SCOOP powder MIX 17 G AND TAKE BY MOUTH DAILY. 02/20/19   [name], MD  rOPINIRole (REQUIP) 0.25 MG tablet TAKE 1 TABLET BY MOUTH THREE TIMES A DAY 05/22/19   [name], MD  simvastatin (ZOCOR) 20 MG tablet TAKE 1 TABLET BY MOUTH EVERYDAY AT BEDTIME 07/19/19   Dickie La, MD  traMADol (ULTRAM) 50 MG tablet TAKE 1 TABLET BY MOUTH EVERY 12 HOURS AS NEEDED FOR PAIN 10/31/19   [name], MD  traZODone (DESYREL) 100 MG tablet TAKE 1 TABLET BY MOUTH EVERYDAY AT BEDTIME 11/24/19   [name], MD    Family History    Family History  Problem Relation Age of Onset  . Parkinson's disease Mother   . Heart attack Brother   . Arthritis Brother   . Cancer Brother        Unknown  . Breast cancer Neg Hx    She indicated that her mother is deceased. She indicated that the status of her father is unknown and reported the following: Unknown. She indicated that her sister is alive. She indicated that her brother is deceased. She indicated that both of her sons are alive. She indicated that the status of her neg hx is unknown.  Social History    Social History   Socioeconomic History  . Marital status: Widowed    Spouse name: Not on file  . Number of children: 2  . Years of education: 10  . Highest education  level: 10th grade  Occupational History  . Not on file  Tobacco Use  . Smoking status: Never Smoker  . Smokeless tobacco: Never Used  Vaping Use  . Vaping Use: Never used  Substance and Sexual Activity  . Alcohol use: No  . Drug use: No  . Sexual activity: Not Currently    Birth control/protection: Post-menopausal  Other Topics Concern  . 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 Drinks water and one cup of coffee daily.   Religious / Personal Beliefs: Baptist. "I believe Jesus died on the cross to save me [id number] my sins. I am saved by the grace of God."    Other: "I have a good life and am happy. My family is there when I need help."                                                              Social Determinants of Health   Financial Resource Strain:   . Difficulty of Paying Living Expenses: Not on file  Food Insecurity:   . Worried About Charity fundraiser in the Last Year: Not on file  . Ran Out of Food in the Last Year: Not on file  Transportation Needs:   . Lack of Transportation (Medical): Not on file  . Lack of Transportation (Non-Medical): Not on file  Physical Activity:   . Days of Exercise per Week: Not on file  . Minutes of Exercise per Session: Not on file  Stress:   . Feeling of Stress : Not on file  Social Connections:   . Frequency of Communication with Friends and Family: Not on file  . Frequency of Social Gatherings with Friends and Family: Not on file  . Attends Religious Services: Not on file    . Active Member of Clubs or Organizations: Not on file  . Attends Archivist Meetings: Not on file  . Marital Status: Not on file  Intimate Partner Violence:   . Fear of Current or Ex-Partner: Not on file  . Emotionally Abused: Not on file  . Physically Abused: Not on file  . Sexually Abused: Not on file     Review of Systems    General:  No chills, fever, night sweats or weight changes.  Cardiovascular:  No chest pain, dyspnea on exertion, edema, orthopnea, palpitations, paroxysmal nocturnal dyspnea. Dermatological: No rash, lesions/masses Respiratory: No cough, dyspnea Urologic: No hematuria, dysuria Abdominal:   No nausea, vomiting, diarrhea, bright red blood per rectum, melena, or hematemesis Neurologic:  No visual changes, wkns, changes in mental status. All other systems reviewed and are otherwise negative except as noted above.  Physical Exam    VS:  BP (!) 142/74   Pulse 71   Ht 5\' 1"  (1.549 m)   Wt 132 lb 12.8 oz (60.2 kg)   SpO2 94%   BMI 25.09 kg/m  , BMI Body mass index is 25.09 kg/m. GEN: Well nourished, well developed, in no acute distress. HEENT: normal. Neck: Supple, no JVD, carotid bruits, or masses. Cardiac: RRR, no murmurs, rubs, or gallops. No clubbing, cyanosis, edema.  Radials/DP/PT 2+ and equal bilaterally.  Respiratory:  Respirations regular and  unlabored, clear to auscultation bilaterally. GI: Soft, nontender, nondistended, BS + x 4. MS: no deformity or atrophy. Skin: warm and dry, no rash. Neuro:  Strength and sensation are intact. Psych: Normal affect.  Accessory Clinical Findings    Recent Labs: 06/28/2019: ALT 23 12/13/2019: BUN 20; Creatinine, Ser 1.66; Hemoglobin 11.5; Platelets 180; Potassium 4.4; Sodium 139; TSH 1.190   Recent Lipid Panel    Component Value Date/Time   CHOL 135 12/13/2019 1102   TRIG 116 12/13/2019 1102   HDL 62 12/13/2019 1102   CHOLHDL 2.2 12/13/2019 1102   CHOLHDL 2.8 04/08/2016 1153   VLDL 26  04/08/2016 1153   LDLCALC 52 12/13/2019 1102    ECG personally reviewed by me today-sinus rhythm no ST or T wave deviation 71 bpm- No acute changes  EKG 06/08/2019 Sinus rhythm with PVCs 66 bpm  Echocardiogram 01/30/2019 IMPRESSIONS    1. LVEF is mildly depressed.  2. LA, LAA without masses. No PFO by color doppler.  3. MR is at least moderate and is centrally directed.  4. The aortic valve is tricuspid Mild thickening of the aortic valve.  Aortic valve regurgitation is trivial by color flow Doppler.  5. There is evidence of plaque in the descending aorta.   FINDINGS  Left Ventricle: LVEF is mildly depressed.   Left Atrium: LA, LAA without masses. No PFO by color doppler.    Interatrial Septum: Saline contrast bubble study was negative, with no  evidence of any interatrial shunt.   Mitral Valve: The mitral valve is normal in structure. MR is at least  moderate and is centrally directed.   Tricuspid Valve: The tricuspid valve was normal in structure. Tricuspid  valve regurgitation is trivial by color flow Doppler.   Aortic Valve: The aortic valve is tricuspid Mild thickening of the aortic  valve. Aortic valve regurgitation is trivial by color flow Doppler.   Pulmonic Valve: The pulmonic valve was grossly normal.   Aorta: There is evidence of plaque in the descending aorta.   Assessment & Plan   1. Hypertension-BP today 142/74. Contacted after hours triage line and indicated that when she was told return she should go with her blood pressures. She was prescribed as needed hydralazine at that time. Underwent continuous blood pressure monitor which showed acceptable blood pressure 139 over 80s. Continue hydralazine-we will increase frequency to 3 times daily as needed and keep same parameters. Heart healthy low-sodium diet-salty 6 given Increase physical activity as tolerated  Atrial fibrillation/flutter-EKG today shows sinus rhythm 71 bpm. Was noted to decompensate  with reduction in amiodarone. CHA2DS2-VASc score 5. Continue amiodarone, apixaban Heart healthy low-sodium diet-salty 6 given Increase physical activity as tolerated  Coronary artery disease-no chest pain today. No aspirin due to anticoagulation. No beta-blocker due to bradycardia. Continue Imdur, nitroglycerin Heart healthy low-sodium diet-salty 6 given Increase physical activity as tolerated   Hyperlipidemia-12/13/2019: Cholesterol, Total 135; HDL 62; LDL Chol Calc (NIH) 52; Triglycerides 116 Continue simvastatin, omega-3 fatty acids, Heart healthy low-sodium diet-salty 6 given Increase physical activity as tolerated   Disposition: Follow-up with Dr. [name] or me in 1 months.  Jossie Ng. Farheen Pfahler NP-C    04/10/2020, 2:44 PM [address] Office [phone] Fax [phone]  Notice: This dictation was prepared with Dragon dictation along with smaller phrase technology. Any transcriptional errors that result from this process are unintentional and may not be corrected upon review.

## 2020-04-09 NOTE — Telephone Encounter (Signed)
Patient called with systolic BP spiking to 358+.  Her only symptoms is weakness and tiredness.  Has appointment with cards tomorrow, unsure who she is seeing.  I have been worried about both over and under treatment of her hypertension.  I did a 24 hour BP reading on her in June.  Please see Dr. Graylin Shiver office visit not on 01/15/2020.  Average BP was in a good range at that time.  I look forward to the second opinion of cards about how to best manage her BP and symptom of tiredness.

## 2020-04-09 NOTE — Telephone Encounter (Signed)
She is seeing Coletta Memos and I included him on this message. I agree that her BP has been quite volatile. Denyse Amass can call me when he sees her, I will be rounding this week at [facility] and [facility].

## 2020-04-10 ENCOUNTER — Encounter: Payer: Self-pay | Admitting: General Practice

## 2020-04-10 ENCOUNTER — Ambulatory Visit: Payer: PPO | Admitting: General Practice

## 2020-04-10 ENCOUNTER — Other Ambulatory Visit: Payer: Self-pay

## 2020-04-10 VITALS — BP 142/74 | HR 71 | Ht 61.0 in | Wt 132.8 lb

## 2020-04-10 DIAGNOSIS — E7800 Pure hypercholesterolemia, unspecified: Secondary | ICD-10-CM | POA: Diagnosis not present

## 2020-04-10 DIAGNOSIS — I25728 Atherosclerosis of autologous artery coronary artery bypass graft(s) with other forms of angina pectoris: Secondary | ICD-10-CM

## 2020-04-10 DIAGNOSIS — I480 Paroxysmal atrial fibrillation: Secondary | ICD-10-CM

## 2020-04-10 DIAGNOSIS — R0600 Dyspnea, unspecified: Secondary | ICD-10-CM | POA: Diagnosis not present

## 2020-04-10 DIAGNOSIS — I10 Essential (primary) hypertension: Secondary | ICD-10-CM | POA: Diagnosis not present

## 2020-04-10 DIAGNOSIS — R0609 Other forms of dyspnea: Secondary | ICD-10-CM

## 2020-04-10 MED ORDER — HYDRALAZINE HCL 25 MG PO TABS: ORAL_TABLET | ORAL | 1 refills | Status: DC

## 2020-04-10 NOTE — Patient Instructions (Signed)
Medication Instructions:  May increase hydralazine to THREE TIMES DAILY  *If you need a refill on your cardiac medications before your next appointment, please call your pharmacy*  Lab Work: NONE  Testing/Procedures: Echocardiogram - Your physician has requested that you have an echocardiogram. Echocardiography is a painless test that uses sound waves to create images of your heart. It provides your doctor with information about the size and shape of your heart and how well your heart's chambers and valves are working. This procedure takes approximately one hour. There are no restrictions for this procedure. This will be performed at our [facility] location - [address].  Special Instructions PLEASE READ AND FOLLOW SALTY 6-ATTACHED  Follow-Up: Your next appointment:  1 month(s) In Person with Sanda Klein, MD -[place], FNP-C   At [facility], you and your health needs are our priority.  As part of our continuing mission to provide you with exceptional heart care, we have created designated Provider Care Teams.  These Care Teams include your primary Cardiologist (physician) and Advanced Practice Providers (APPs -  Physician Assistants and Nurse Practitioners) who all work together to provide you with the care you need, when you need it.  We recommend signing up for the patient portal called "MyChart".  Sign up information is provided on this After Visit Summary.  MyChart is used to connect with patients for Virtual Visits (Telemedicine).  Patients are able to view lab/test results, encounter notes, upcoming appointments, etc.  Non-urgent messages can be sent to your provider as well.   To learn more about what you can do with MyChart, go to [email or web address].

## 2020-04-10 NOTE — Progress Notes (Signed)
Thanks, Jesse 

## 2020-04-11 MED ORDER — MECLIZINE HCL 12.5 MG PO TABS: 12.5000 mg | ORAL_TABLET | Freq: Three times a day (TID) | ORAL | 1 refills | Status: DC | PRN

## 2020-04-11 NOTE — Telephone Encounter (Signed)
Very dizzy.  BP quite low after a morning dose of hydralazine.  She now wonders if some of her dizziness might be a return of her vertigo.  She has responded well to low dose meclizine in the past (yes, a [place] drug).  No more hydralazine today.  Low dose meclizine.  Continue to monitor.

## 2020-04-11 NOTE — Telephone Encounter (Signed)
Patient calls nurse line regarding BP concerns. Patient reports multiple low diastolic readings- see below.   - 118/59, 113/48, 109/42, 140/55.   Patient is beginning to report slight dizziness. Spoke with Dr. Andria Frames regarding patient. Provider will call patient to discuss BP management further.   Forwarding to PCP  [name], RN

## 2020-04-11 NOTE — Addendum Note (Signed)
Addended by: [name] on: 04/11/2020 03:08 PM   Modules accepted: Orders

## 2020-04-14 ENCOUNTER — Other Ambulatory Visit: Payer: Self-pay | Admitting: Physician Assistant

## 2020-04-21 ENCOUNTER — Other Ambulatory Visit: Payer: Self-pay | Admitting: Family Medicine

## 2020-04-21 DIAGNOSIS — D500 Iron deficiency anemia secondary to blood loss (chronic): Secondary | ICD-10-CM

## 2020-04-23 ENCOUNTER — Ambulatory Visit (HOSPITAL_COMMUNITY): Payer: PPO | Attending: Cardiology

## 2020-04-23 ENCOUNTER — Other Ambulatory Visit: Payer: Self-pay

## 2020-04-23 DIAGNOSIS — R0600 Dyspnea, unspecified: Secondary | ICD-10-CM | POA: Diagnosis not present

## 2020-04-23 DIAGNOSIS — I25728 Atherosclerosis of autologous artery coronary artery bypass graft(s) with other forms of angina pectoris: Secondary | ICD-10-CM | POA: Insufficient documentation

## 2020-04-23 DIAGNOSIS — R0609 Other forms of dyspnea: Secondary | ICD-10-CM

## 2020-04-23 LAB — ECHOCARDIOGRAM COMPLETE
Area-P 1/2: 3.42 cm2
S' Lateral: 2.9 cm

## 2020-05-01 DIAGNOSIS — H16223 Keratoconjunctivitis sicca, not specified as Sjogren's, bilateral: Secondary | ICD-10-CM | POA: Diagnosis not present

## 2020-05-01 DIAGNOSIS — H04123 Dry eye syndrome of bilateral lacrimal glands: Secondary | ICD-10-CM | POA: Diagnosis not present

## 2020-05-01 DIAGNOSIS — Z961 Presence of intraocular lens: Secondary | ICD-10-CM | POA: Diagnosis not present

## 2020-05-01 DIAGNOSIS — H26491 Other secondary cataract, right eye: Secondary | ICD-10-CM | POA: Diagnosis not present

## 2020-05-06 ENCOUNTER — Other Ambulatory Visit: Payer: Self-pay | Admitting: Family Medicine

## 2020-05-06 DIAGNOSIS — E039 Hypothyroidism, unspecified: Secondary | ICD-10-CM

## 2020-05-15 ENCOUNTER — Other Ambulatory Visit: Payer: Self-pay | Admitting: Family Medicine

## 2020-05-15 DIAGNOSIS — G609 Hereditary and idiopathic neuropathy, unspecified: Secondary | ICD-10-CM

## 2020-05-16 ENCOUNTER — Other Ambulatory Visit: Payer: Self-pay | Admitting: Family Medicine

## 2020-05-16 ENCOUNTER — Telehealth: Payer: Self-pay | Admitting: Cardiovascular Disease

## 2020-05-16 NOTE — Telephone Encounter (Signed)
Pt c/o medication issue:  1. Name of Medication: ELIQUIS 2.5 MG TABS tablet  2. How are you currently taking this medication (dosage and times per day)? 1 tablet twice a day  3. Are you having a reaction (difficulty breathing--STAT)? no  4. What is your medication issue? Patient states a letter needs to be sent to [facility] so she can get assistance for the medication.

## 2020-05-16 NOTE — Telephone Encounter (Signed)
*  STAT* If patient is at the pharmacy, call can be transferred to refill team.   1. Which medications need to be refilled? (please list name of each medication and dose if known)   ELIQUIS 2.5 MG TABS tablet     2. Which pharmacy/location (including street and city if local pharmacy) is medication to be sent to? [id number] [place],  - [address]  3. Do they need a 30 day or 90 day supply? 90 day supply

## 2020-05-17 NOTE — Telephone Encounter (Signed)
**Note De-Identified  Obfuscation** This looks like the pt need pt asst not a PA. Will forward to NL office.

## 2020-05-17 NOTE — Telephone Encounter (Signed)
Routed to Dr. Loletha Grayer nurse to work on patient assistance.

## 2020-05-20 MED ORDER — APIXABAN 2.5 MG PO TABS
2.5000 mg | ORAL_TABLET | Freq: Two times a day (BID) | ORAL | 3 refills | Status: DC
Start: 2020-05-20 — End: 2020-05-20

## 2020-05-20 MED ORDER — APIXABAN 2.5 MG PO TABS
2.5000 mg | ORAL_TABLET | Freq: Two times a day (BID) | ORAL | 3 refills | Status: DC
Start: 2020-05-20 — End: 2021-09-10

## 2020-05-20 NOTE — Telephone Encounter (Signed)
Eliquis assistance has been faxed, the provider portion. The patient portion has been faxed by the patient's family.

## 2020-05-21 ENCOUNTER — Encounter: Payer: Self-pay | Admitting: Cardiovascular Disease

## 2020-05-21 ENCOUNTER — Other Ambulatory Visit: Payer: Self-pay

## 2020-05-21 ENCOUNTER — Ambulatory Visit (INDEPENDENT_AMBULATORY_CARE_PROVIDER_SITE_OTHER): Payer: PPO | Admitting: Cardiovascular Disease

## 2020-05-21 VITALS — BP 154/70 | HR 65 | Ht 61.0 in | Wt 132.0 lb

## 2020-05-21 DIAGNOSIS — I5032 Chronic diastolic (congestive) heart failure: Secondary | ICD-10-CM

## 2020-05-21 DIAGNOSIS — I25728 Atherosclerosis of autologous artery coronary artery bypass graft(s) with other forms of angina pectoris: Secondary | ICD-10-CM

## 2020-05-21 DIAGNOSIS — E7800 Pure hypercholesterolemia, unspecified: Secondary | ICD-10-CM

## 2020-05-21 DIAGNOSIS — I480 Paroxysmal atrial fibrillation: Secondary | ICD-10-CM | POA: Diagnosis not present

## 2020-05-21 DIAGNOSIS — N184 Chronic kidney disease, stage 4 (severe): Secondary | ICD-10-CM | POA: Diagnosis not present

## 2020-05-21 DIAGNOSIS — Z5181 Encounter for therapeutic drug level monitoring: Secondary | ICD-10-CM | POA: Diagnosis not present

## 2020-05-21 DIAGNOSIS — Z79899 Other long term (current) drug therapy: Secondary | ICD-10-CM | POA: Diagnosis not present

## 2020-05-21 DIAGNOSIS — I2721 Secondary pulmonary arterial hypertension: Secondary | ICD-10-CM | POA: Diagnosis not present

## 2020-05-21 DIAGNOSIS — Z7901 Long term (current) use of anticoagulants: Secondary | ICD-10-CM

## 2020-05-21 DIAGNOSIS — I951 Orthostatic hypotension: Secondary | ICD-10-CM | POA: Diagnosis not present

## 2020-05-21 NOTE — Patient Instructions (Signed)
Medication Instructions:  STOP the Isosorbide (Imdur)  *If you need a refill on your cardiac medications before your next appointment, please call your pharmacy*   Lab Work: None ordered If you have labs (blood work) drawn today and your tests are completely normal, you will receive your results only by: Marland Kitchen MyChart Message (if you have MyChart) OR . A paper copy in the mail If you have any lab test that is abnormal or we need to change your treatment, we will call you to review the results.   Testing/Procedures: None ordered   Follow-Up: At [facility], you and your health needs are our priority.  As part of our continuing mission to provide you with exceptional heart care, we have created designated Provider Care Teams.  These Care Teams include your primary Cardiologist (physician) and Advanced Practice Providers (APPs -  Physician Assistants and Nurse Practitioners) who all work together to provide you with the care you need, when you need it.  We recommend signing up for the patient portal called "MyChart".  Sign up information is provided on this After Visit Summary.  MyChart is used to connect with patients for Virtual Visits (Telemedicine).  Patients are able to view lab/test results, encounter notes, upcoming appointments, etc.  Non-urgent messages can be sent to your provider as well.   To learn more about what you can do with MyChart, go to [email or web address].    Your next appointment:   3 month(s)  The format for your next appointment:   In Person  Provider:   Follow up with Jacqueline Schneiders, NP in 2-3 months  Other Instructions Please wear compression stockings during the day only
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
## 2020-05-25 ENCOUNTER — Inpatient Hospital Stay (HOSPITAL_COMMUNITY)
Admission: EM | Admit: 2020-05-25 | Discharge: 2020-05-27 | DRG: 309 | Disposition: A | Discharge status: Home / Self Care | Payer: PPO | Attending: Family Medicine | Admitting: Family Medicine

## 2020-05-25 ENCOUNTER — Telehealth: Payer: Self-pay | Admitting: Cardiology

## 2020-05-25 ENCOUNTER — Encounter (HOSPITAL_COMMUNITY): Payer: Self-pay | Admitting: Emergency Medicine

## 2020-05-25 ENCOUNTER — Other Ambulatory Visit: Payer: Self-pay | Admitting: Family Medicine

## 2020-05-25 ENCOUNTER — Other Ambulatory Visit: Payer: Self-pay

## 2020-05-25 ENCOUNTER — Emergency Department (HOSPITAL_COMMUNITY): Payer: PPO

## 2020-05-25 DIAGNOSIS — N390 Urinary tract infection, site not specified: Secondary | ICD-10-CM | POA: Diagnosis not present

## 2020-05-25 DIAGNOSIS — E785 Hyperlipidemia, unspecified: Secondary | ICD-10-CM | POA: Diagnosis not present

## 2020-05-25 DIAGNOSIS — Z951 Presence of aortocoronary bypass graft: Secondary | ICD-10-CM

## 2020-05-25 DIAGNOSIS — Z79899 Other long term (current) drug therapy: Secondary | ICD-10-CM

## 2020-05-25 DIAGNOSIS — R531 Weakness: Secondary | ICD-10-CM | POA: Diagnosis present

## 2020-05-25 DIAGNOSIS — R296 Repeated falls: Secondary | ICD-10-CM | POA: Diagnosis present

## 2020-05-25 DIAGNOSIS — Z8261 Family history of arthritis: Secondary | ICD-10-CM | POA: Diagnosis not present

## 2020-05-25 DIAGNOSIS — I4891 Unspecified atrial fibrillation: Secondary | ICD-10-CM | POA: Diagnosis not present

## 2020-05-25 DIAGNOSIS — G4700 Insomnia, unspecified: Secondary | ICD-10-CM | POA: Diagnosis not present

## 2020-05-25 DIAGNOSIS — I130 Hypertensive heart and chronic kidney disease with heart failure and stage 1 through stage 4 chronic kidney disease, or unspecified chronic kidney disease: Secondary | ICD-10-CM | POA: Diagnosis present

## 2020-05-25 DIAGNOSIS — R001 Bradycardia, unspecified: Secondary | ICD-10-CM | POA: Diagnosis present

## 2020-05-25 DIAGNOSIS — Z8249 Family history of ischemic heart disease and other diseases of the circulatory system: Secondary | ICD-10-CM | POA: Diagnosis not present

## 2020-05-25 DIAGNOSIS — Z7901 Long term (current) use of anticoagulants: Secondary | ICD-10-CM

## 2020-05-25 DIAGNOSIS — N184 Chronic kidney disease, stage 4 (severe): Secondary | ICD-10-CM | POA: Diagnosis not present

## 2020-05-25 DIAGNOSIS — J984 Other disorders of lung: Secondary | ICD-10-CM | POA: Diagnosis present

## 2020-05-25 DIAGNOSIS — E039 Hypothyroidism, unspecified: Secondary | ICD-10-CM | POA: Diagnosis present

## 2020-05-25 DIAGNOSIS — I480 Paroxysmal atrial fibrillation: Secondary | ICD-10-CM | POA: Diagnosis not present

## 2020-05-25 DIAGNOSIS — M1990 Unspecified osteoarthritis, unspecified site: Secondary | ICD-10-CM | POA: Diagnosis not present

## 2020-05-25 DIAGNOSIS — Z20822 Contact with and (suspected) exposure to covid-19: Secondary | ICD-10-CM | POA: Diagnosis not present

## 2020-05-25 DIAGNOSIS — Z888 Allergy status to other drugs, medicaments and biological substances status: Secondary | ICD-10-CM

## 2020-05-25 DIAGNOSIS — Z96651 Presence of right artificial knee joint: Secondary | ICD-10-CM | POA: Diagnosis present

## 2020-05-25 DIAGNOSIS — I5032 Chronic diastolic (congestive) heart failure: Secondary | ICD-10-CM | POA: Diagnosis present

## 2020-05-25 DIAGNOSIS — R002 Palpitations: Secondary | ICD-10-CM | POA: Diagnosis not present

## 2020-05-25 DIAGNOSIS — Z820 Family history of epilepsy and other diseases of the nervous system: Secondary | ICD-10-CM

## 2020-05-25 DIAGNOSIS — K219 Gastro-esophageal reflux disease without esophagitis: Secondary | ICD-10-CM | POA: Diagnosis not present

## 2020-05-25 DIAGNOSIS — R251 Tremor, unspecified: Secondary | ICD-10-CM | POA: Diagnosis present

## 2020-05-25 DIAGNOSIS — I2510 Atherosclerotic heart disease of native coronary artery without angina pectoris: Secondary | ICD-10-CM | POA: Diagnosis present

## 2020-05-25 DIAGNOSIS — I951 Orthostatic hypotension: Secondary | ICD-10-CM | POA: Diagnosis not present

## 2020-05-25 DIAGNOSIS — Z885 Allergy status to narcotic agent status: Secondary | ICD-10-CM

## 2020-05-25 LAB — BASIC METABOLIC PANEL
Anion gap: 11 (ref 5–15)
BUN: 15 mg/dL (ref 8–23)
CO2: 23 mmol/L (ref 22–32)
Calcium: 9.2 mg/dL (ref 8.9–10.3)
Chloride: 102 mmol/L (ref 98–111)
Creatinine, Ser: 1.74 mg/dL — ABNORMAL HIGH (ref 0.44–1.00)
GFR, Estimated: 28 mL/min — ABNORMAL LOW (ref >60)
Glucose, Bld: 118 mg/dL — ABNORMAL HIGH (ref 70–99)
Potassium: 4.1 mmol/L (ref 3.5–5.1)
Sodium: 136 mmol/L (ref 135–145)

## 2020-05-25 LAB — RESPIRATORY PANEL BY RT PCR (FLU A&B, COVID)
Influenza A by PCR: NEGATIVE
Influenza B by PCR: NEGATIVE
SARS Coronavirus 2 by RT PCR: NEGATIVE

## 2020-05-25 LAB — URINALYSIS, ROUTINE W REFLEX MICROSCOPIC
Bilirubin Urine: NEGATIVE
Glucose, UA: NEGATIVE mg/dL
Hgb urine dipstick: NEGATIVE
Ketones, ur: NEGATIVE mg/dL
Nitrite: POSITIVE — AB
Protein, ur: NEGATIVE mg/dL
Specific Gravity, Urine: 1.013 (ref 1.005–1.030)
pH: 7 (ref 5.0–8.0)

## 2020-05-25 LAB — CBC
HCT: 36.9 % (ref 36.0–46.0)
Hemoglobin: 11.9 g/dL — ABNORMAL LOW (ref 12.0–15.0)
MCH: 31.5 pg (ref 26.0–34.0)
MCHC: 32.2 g/dL (ref 30.0–36.0)
MCV: 97.6 fL (ref 80.0–100.0)
Platelets: 214 10*3/uL (ref 150–400)
RBC: 3.78 MIL/uL — ABNORMAL LOW (ref 3.87–5.11)
RDW: 13.4 % (ref 11.5–15.5)
WBC: 4.7 10*3/uL (ref 4.0–10.5)
nRBC: 0 % (ref 0.0–0.2)

## 2020-05-25 LAB — BRAIN NATRIURETIC PEPTIDE: B Natriuretic Peptide: 284.2 pg/mL — ABNORMAL HIGH (ref 0.0–100.0)

## 2020-05-25 LAB — TROPONIN I (HIGH SENSITIVITY)
Troponin I (High Sensitivity): 9 ng/L (ref <18)
Troponin I (High Sensitivity): 10 ng/L (ref <18)

## 2020-05-25 LAB — TSH: TSH: 1.933 u[IU]/mL (ref 0.350–4.500)

## 2020-05-25 MED ORDER — DILTIAZEM HCL-DEXTROSE 125-5 MG/125ML-% IV SOLN (PREMIX)
5.0000 mg/h | INTRAVENOUS | Status: DC
  Filled 2020-05-25: qty 125

## 2020-05-25 MED ORDER — SODIUM CHLORIDE 0.9 % IV SOLN
1.0000 g | Freq: Once | INTRAVENOUS | Status: AC
  Administered 2020-05-25: 1 g via INTRAVENOUS
  Filled 2020-05-25: qty 10

## 2020-05-25 NOTE — ED Provider Notes (Signed)
[name] Provider Note   CSN: [id number] Arrival date & time: 05/25/20  1826     History Chief Complaint  Patient presents with  . Weakness  . Palpitations    Jacqueline [name] is a 86 y.o. female.  The history is provided by the patient and medical records.  Weakness Palpitations Associated symptoms: weakness    Jacqueline [name] is a 86 y.o. female who presents to the Emergency Department complaining of weakness. She presents the emergency department complaining of one month of generalized weakness, excessive fatigue and malaise. She feels lightheaded at times and feels like her heart has been running fast for her. She saw her cardiologist earlier this week and her imager was discontinued and she started using the recommended compression stockings. She presents the emergency department today because she feels too weak and fatigued to perform her regular activities. She lives at home alone. She denies any fevers, chest pain, shortness of breath, abdominal pain, nausea, vomiting, diarrhea. She has been fully vaccinated for COVID-19. No known sick contacts. She does have a history of paroxysmal atrial fibrillation and is compliant with her home medications.    Past Medical History:  Diagnosis Date  . Arthritis    back  . Atrial fibrillation with rapid ventricular response ([place]) 02/2014   a. CHA2DS2VASc = 6 -> on eliquis;  b. 02/2014 s/p DCCV;  c. 08/2014 Echo: EF 55-60%, Gr 2 DD, mild MR, triv AI.  Marland Kitchen CAD (coronary artery disease)    a. s/p CABG x 2 (LIMA->LAD, VG->Diag);  b. 08/2014 Cath: LM nl, LAD 90p, LCX nl, RCA nl/dominant, LIMA->LAD atretic, VG->D2 patent w/ retrograde filling of LAD, EF 55-60%-->Med Rx.  . Diverticulitis 02/21/2012  . GERD (gastroesophageal reflux disease)   . Hyperlipemia   . Hypertension   . Insomnia     Patient Active Problem List   Diagnosis Date Noted  . Paroxysmal atrial fibrillation with RVR ([place]) 05/25/2020    . Tremor 12/13/2019  . Transaminitis 06/09/2019  . Weakness generalized 06/08/2019  . Frequent falls 06/08/2019  . Pes anserinus bursitis of right knee 03/23/2019  . Neoplasm of uncertain behavior of skin of chest 03/16/2019  . Skin neoplasm 03/09/2019  . A-fib ([place]) 01/28/2019  . Vertigo 11/24/2018  . Urinary tract infection 07/14/2018  . On amiodarone therapy 07/08/2018  . Adjustment reaction with physical symptoms 09/16/2017  . Atherosclerosis of abdominal aorta ([place]) 07/22/2017  . Hypothyroidism 07/22/2017  . Anginal equivalent ([place])   . Long term (current) use of anticoagulants 07/08/2017  . Hereditary and idiopathic peripheral neuropathy 12/12/2015  . Chronic diastolic CHF (congestive heart failure) ([place]) 06/05/2015  . Pulmonary arterial hypertension ([place]) 05/17/2015  . Tachy-brady syndrome ([place]) 05/06/2015  . Hx of CABG 05/06/2015  . Preventative health care 03/27/2015  . Lower GI bleed 02/17/2015  . Dyspnea   . Fatigue 11/15/2014  . Anemia due to chronic blood loss 10/10/2014  . S/P knee replacement 10/08/2014  . Osteoporosis 03/22/2014  . Paroxysmal atrial fibrillation ([place]) 02/27/2014  . Hypertension   . GERD (gastroesophageal reflux disease)   . CKD (chronic kidney disease) stage 4, GFR 15-29 ml/min ([facility]) 12/22/2013  . Dyslipidemia 09/23/2006  . Anemia 09/23/2006  . RHINITIS, ALLERGIC 09/23/2006  . REFLUX ESOPHAGITIS 09/23/2006  . DIVERTICULOSIS OF COLON 09/23/2006  . BACK PAIN, LOW 09/23/2006  . INSOMNIA NOS 09/23/2006    Past Surgical History:  Procedure Laterality Date  . ABDOMINAL HYSTERECTOMY  1975  . CARDIAC CATHETERIZATION  N/A 05/09/2015   Procedure: Right Heart Cath;  Surgeon: Larey Dresser, MD;  Location: [facility];  Service: Cardiovascular;  Laterality: N/A;  . CARDIOVERSION N/A 02/28/2014   Procedure: CARDIOVERSION;  Surgeon: Sanda Klein, MD;  Location: [facility];  Service: Cardiovascular;  Laterality: N/A;  . CARDIOVERSION N/A  01/30/2019   Procedure: CARDIOVERSION;  Surgeon: Fay Records, MD;  Location: [facility] ENDOSCOPY;  Service: Cardiovascular;  Laterality: N/A;  . CATARACT EXTRACTION Bilateral 2014   with lens implanted  . COLONOSCOPY N/A 02/19/2015   Procedure: COLONOSCOPY;  Surgeon: Ladene Artist, MD;  Location: [facility] ENDOSCOPY;  Service: Endoscopy;  Laterality: N/A;  . CORONARY ARTERY BYPASS GRAFT  2006   off-pump bypass surgery with LIMA to the LAD and SVG to the second diagonal artery ( performed by Dr Servando Snare)   . JOINT REPLACEMENT Bilateral    L-2004, R-2006  . LEFT HEART CATHETERIZATION WITH CORONARY/GRAFT ANGIOGRAM N/A 09/17/2014   Procedure: LEFT HEART CATHETERIZATION WITH Beatrix Fetters;  Surgeon: Troy Sine, MD;  Location[facility] CATH LAB;  Service: Cardiovascular;  Laterality: N/A;  . REIMPLANTATION OF TOTAL KNEE Right 10/08/2014   Procedure: REIMPLANTATION OF RIGHT TOTAL KNEE ARTHROPLASTY WITH REMOVAL OF ANTIBIOTIC SPACER;  Surgeon: Paralee Cancel, MD;  Location: [facility];  Service: Orthopedics;  Laterality: Right;  . ROTATOR CUFF REPAIR Bilateral    r-1999, l- 2005  . TEE WITHOUT CARDIOVERSION N/A 02/28/2014   Procedure: TRANSESOPHAGEAL ECHOCARDIOGRAM (TEE);  Surgeon: Sanda Klein, MD;  Location: [facility];  Service: Cardiovascular;  Laterality: N/A;  . TEE WITHOUT CARDIOVERSION N/A 01/30/2019   Procedure: TRANSESOPHAGEAL ECHOCARDIOGRAM (TEE);  Surgeon: Fay Records, MD;  Location: [facility];  Service: Cardiovascular;  Laterality: N/A;  . TOTAL KNEE ARTHROPLASTY Right 07/02/2014   Procedure: Resection of Infected Right Total Knee Arthroplasty with placement antibiotic spacer;  Surgeon: Mauri Pole, MD;  Location: [facility];  Service: Orthopedics;  Laterality: Right;     OB History   No obstetric history on file.     Family History  Problem Relation Age of Onset  . Parkinson's disease Mother   . Heart attack Brother   . Arthritis Brother   . Cancer Brother        Unknown  . Breast  cancer Neg Hx     Social History   Tobacco Use  . Smoking status: Never Smoker  . Smokeless tobacco: Never Used  Vaping Use  . Vaping Use: Never used  Substance Use Topics  . Alcohol use: No  . Drug use: No    Home Medications Prior to Admission medications   Medication Sig Start Date End Date Taking? Authorizing Provider  acetaminophen (TYLENOL) 500 MG tablet Take 500 mg by mouth every 6 (six) hours as needed for moderate pain (pain/headache.).    Yes [provider]  ALPRAZolam (XANAX) 0.5 MG tablet TAKE 1 TABLET BY MOUTH TWICE A DAY AS NEEDED FOR ANXIETY Patient taking differently: Take 0.5 mg by mouth 2 (two) times daily as needed for anxiety or sleep.  01/02/20  Yes Hensel, Jamal Collin, MD  amiodarone (PACERONE) 200 MG tablet Take 1 tablet (200 mg total) by mouth daily. 06/08/19  Yes Hensel, Jamal Collin, MD  apixaban (ELIQUIS) 2.5 MG TABS tablet Take 1 tablet (2.5 mg total) by mouth 2 (two) times daily. 05/20/20  Yes Croitoru, Mihai, MD  cholecalciferol (VITAMIN D3) 25 MCG (1000 UNIT) tablet Take 1,000 Units by mouth daily.   Yes [provider]  cycloSPORINE (  RESTASIS) 0.05 % ophthalmic emulsion Place 1 drop into both eyes 2 (two) times daily.   Yes [provider]  gabapentin (NEURONTIN) 100 MG capsule TAKE 1 CAPSULE (100 MG TOTAL) BY MOUTH 3 (THREE) TIMES DAILY. 05/15/20  Yes Hensel, Jamal Collin, MD  hydrALAZINE (APRESOLINE) 25 MG tablet TAKE 1 TABLET BY MOUTH EVERY DAY FOR SBP ABOVE 160 AS NEEDED UP TO 2 TABS DAILY 04/15/20  Yes Cleaver, Jossie Ng, NP  levothyroxine (SYNTHROID) 75 MCG tablet TAKE 1 TABLET BY MOUTH EVERY DAY 05/06/20  Yes Hensel, Jamal Collin, MD  meclizine (ANTIVERT) 12.5 MG tablet Take 1 tablet (12.5 mg total) by mouth 3 (three) times daily as needed for dizziness. 04/11/20  Yes Hensel, Jamal Collin, MD  Multiple Vitamin (MULTIVITAMIN WITH MINERALS) TABS tablet Take 1 tablet by mouth daily.   Yes [provider]  nitroGLYCERIN (NITROSTAT) 0.4  MG SL tablet PLACE 1 TABLET UNDER THE TONGUE EVERY 5 MINUTES AS NEEDED FOR CHEST PAIN UP TO 3 DOSES Patient taking differently: Place 0.4 mg under the tongue every 5 (five) minutes x 3 doses as needed for chest pain. PLACE 1 TABLET UNDER THE TONGUE EVERY 5 MINUTES AS NEEDED FOR CHEST PAIN UP TO 3 DOSES 10/20/18  Yes Croitoru, Mihai, MD  Omega-3 Fatty Acids (FISH OIL) 1000 MG CAPS Take 1,000 mg by mouth 2 (two) times a day.    Yes [provider]  omeprazole (PRILOSEC) 20 MG capsule TAKE 1 CAPSULE BY MOUTH EVERY DAY 11/22/19  Yes Hensel, Jamal Collin, MD  Polyethyl Glycol-Propyl Glycol (LUBRICANT EYE DROPS) 0.4-0.3 % SOLN Place 1-2 drops into both eyes 3 (three) times daily as needed (dry/irritated eyes.).   Yes [provider]  polyethylene glycol powder ([employer] PURELAX) 17 GM/SCOOP powder MIX 17 G AND TAKE BY MOUTH DAILY. Patient taking differently: Take 1 Container by mouth 3 (three) times a week.  04/22/20  Yes Hensel, Jamal Collin, MD  rOPINIRole (REQUIP) 0.25 MG tablet TAKE 1 TABLET BY MOUTH THREE TIMES A DAY 05/15/20  Yes Hensel, Jamal Collin, MD  simvastatin (ZOCOR) 20 MG tablet TAKE 1 TABLET BY MOUTH EVERYDAY AT BEDTIME Patient taking differently: Take 20 mg by mouth every evening.  07/19/19  Yes Dickie La, MD  traMADol (ULTRAM) 50 MG tablet TAKE 1 TABLET BY MOUTH EVERY 12 HOURS AS NEEDED FOR PAIN Patient taking differently: Take 50 mg by mouth every 12 (twelve) hours as needed for moderate pain or severe pain.  05/06/20  Yes Hensel, Jamal Collin, MD  traZODone (DESYREL) 100 MG tablet TAKE 1 TABLET BY MOUTH EVERYDAY AT BEDTIME Patient taking differently: Take 100 mg by mouth at bedtime.  11/24/19  Yes Hensel, Jamal Collin, MD    Allergies    Atorvastatin, Beta adrenergic blockers, Crestor [rosuvastatin], Morphine and related, and Prednisone  Review of Systems   Review of Systems  Cardiovascular: Positive for palpitations.  Neurological: Positive for weakness.  All other systems reviewed  and are negative.   Physical Exam Updated Vital Signs BP (!) 156/70   Pulse (!) 55   Temp 98.6 F (37 C)   Resp 18   SpO2 97%   Physical Exam Vitals and nursing note reviewed.  Constitutional:      Appearance: She is well-developed.  HENT:     Head: Normocephalic and atraumatic.  Cardiovascular:     Rate and Rhythm: Tachycardia present. Rhythm irregular.     Heart sounds: No murmur heard.   Pulmonary:     Effort: Pulmonary  effort is normal. No respiratory distress.     Breath sounds: Normal breath sounds.  Abdominal:     Palpations: Abdomen is soft.     Tenderness: There is no abdominal tenderness. There is no guarding or rebound.  Musculoskeletal:        General: No swelling or tenderness.  Skin:    General: Skin is warm and dry.  Neurological:     Mental Status: She is alert and oriented to person, place, and time.     Comments: Five out of five strength in all four extremities with sensation light touch intact in all four extremities  Psychiatric:        Behavior: Behavior normal.     ED Results / Procedures / Treatments   Labs (all labs ordered are listed, but only abnormal results are displayed) Labs Reviewed  BASIC METABOLIC PANEL - Abnormal; Notable for the following components:      Result Value   Glucose, Bld 118 (*)    Creatinine, Ser 1.74 (*)    GFR, Estimated 28 (*)    All other components within normal limits  CBC - Abnormal; Notable for the following components:   RBC 3.78 (*)    Hemoglobin 11.9 (*)    All other components within normal limits  URINALYSIS, ROUTINE W REFLEX MICROSCOPIC - Abnormal; Notable for the following components:   APPearance HAZY (*)    Nitrite POSITIVE (*)    Leukocytes,Ua MODERATE (*)    Bacteria, UA MANY (*)    All other components within normal limits  BRAIN NATRIURETIC PEPTIDE - Abnormal; Notable for the following components:   B Natriuretic Peptide 284.2 (*)    All other components within normal limits    RESPIRATORY PANEL BY RT PCR (FLU A&B, COVID)  URINE CULTURE  TSH  TROPONIN I (HIGH SENSITIVITY)  TROPONIN I (HIGH SENSITIVITY)    EKG EKG Interpretation  Date/Time:  Saturday May 25 2020 18:26:52 EDT Ventricular Rate:  118 PR Interval:    QRS Duration: 86 QT Interval:  324 QTC Calculation: 454 R Axis:   50 Text Interpretation: Sinus tachycardia ST & T wave abnormality, consider lateral ischemia Abnormal ECG Confirmed by Quintella Reichert [phone]) on 05/25/2020 7:38:48 PM   Radiology DG Chest 2 View  Result Date: 05/25/2020 CLINICAL DATA:  Palpitations EXAM: CHEST - 2 VIEW COMPARISON:  09/11/2017 FINDINGS: Prior CABG. Heart is normal size. Possible air-filled cystic area projects over the right hilum. This is new since prior study. This conceivably could be artifactual, external to the patient. Left lung clear. No effusions. IMPRESSION: Thin walled air-filled cystic structure projecting over the right hilum, noted posteriorly on the lateral view. This could reflect a small pneumatocele or cavitary lesion. This could be further evaluated with chest CT if felt clinically indicated. Electronically Signed   By: Rolm Baptise M.D.   On: 05/25/2020 19:45    Procedures Procedures (including critical care time)  Medications Ordered in ED Medications  diltiazem (CARDIZEM) 125 mg in dextrose 5% 125 mL (1 mg/mL) infusion (5 mg/hr Intravenous Not Given 05/25/20 2114)  cefTRIAXone (ROCEPHIN) 1 g in sodium chloride 0.9 % 100 mL IVPB (1 g Intravenous New Bag/Given 05/25/20 2250)    ED Course  I have reviewed the triage vital signs and the nursing notes.  Pertinent labs & imaging results that were available during my care of the patient were reviewed by me and considered in my medical decision making (see chart for details).    MDM Rules/Calculators/A&P  patient with history of paroxysmal a fib here for evaluation of one week of progressive weakness. Patient in a  fib with RVR shortly after her initial ED presentation. Cardizem was ordered but by the time the medication arrived at bedside she converted to sinus rhythm. UA is consistent with UTI. She will treat with antibiotics. Given she lives alone and has significant generalized weakness plan to admit for observation for treatment of UTI, possible cards consult for paroxysmal a fib.  BMP with stable renal insufficiency.  Final Clinical Impression(s) / ED Diagnoses Final diagnoses:  Acute UTI  Paroxysmal atrial fibrillation ([place])  Generalized weakness    Rx / DC Orders ED Discharge Orders    None       Quintella Reichert, MD 05/26/20 0009

## 2020-05-25 NOTE — ED Triage Notes (Signed)
Pt reports increased palpitations/weakness and dizziness X1 week.  Pt was seen by Cardiac Provider earlier, was sent here tonight.  Has had medication changes. Did fall last Monday, not hitting her head, is sore on the right side of her body (provider aware.)

## 2020-05-25 NOTE — Telephone Encounter (Signed)
Pt called she believes she is in atrial fib.  HR 110 and this is up from her usual, no pain or SOB just weak. She has not missed eliquis.  Gave option she can try and wait it out or come to ER - she will try to wait until Monday but may need to come in.

## 2020-05-26 ENCOUNTER — Encounter (HOSPITAL_COMMUNITY): Payer: Self-pay | Admitting: Family Medicine

## 2020-05-26 ENCOUNTER — Observation Stay (HOSPITAL_COMMUNITY): Payer: PPO

## 2020-05-26 DIAGNOSIS — Z820 Family history of epilepsy and other diseases of the nervous system: Secondary | ICD-10-CM | POA: Diagnosis not present

## 2020-05-26 DIAGNOSIS — E785 Hyperlipidemia, unspecified: Secondary | ICD-10-CM | POA: Diagnosis present

## 2020-05-26 DIAGNOSIS — G4700 Insomnia, unspecified: Secondary | ICD-10-CM | POA: Diagnosis present

## 2020-05-26 DIAGNOSIS — Z8249 Family history of ischemic heart disease and other diseases of the circulatory system: Secondary | ICD-10-CM | POA: Diagnosis not present

## 2020-05-26 DIAGNOSIS — I130 Hypertensive heart and chronic kidney disease with heart failure and stage 1 through stage 4 chronic kidney disease, or unspecified chronic kidney disease: Secondary | ICD-10-CM | POA: Diagnosis present

## 2020-05-26 DIAGNOSIS — N184 Chronic kidney disease, stage 4 (severe): Secondary | ICD-10-CM | POA: Diagnosis present

## 2020-05-26 DIAGNOSIS — K219 Gastro-esophageal reflux disease without esophagitis: Secondary | ICD-10-CM | POA: Diagnosis present

## 2020-05-26 DIAGNOSIS — N390 Urinary tract infection, site not specified: Secondary | ICD-10-CM | POA: Diagnosis present

## 2020-05-26 DIAGNOSIS — R002 Palpitations: Secondary | ICD-10-CM | POA: Diagnosis present

## 2020-05-26 DIAGNOSIS — I951 Orthostatic hypotension: Secondary | ICD-10-CM | POA: Diagnosis present

## 2020-05-26 DIAGNOSIS — R531 Weakness: Secondary | ICD-10-CM | POA: Diagnosis present

## 2020-05-26 DIAGNOSIS — Z951 Presence of aortocoronary bypass graft: Secondary | ICD-10-CM | POA: Diagnosis not present

## 2020-05-26 DIAGNOSIS — M1990 Unspecified osteoarthritis, unspecified site: Secondary | ICD-10-CM | POA: Diagnosis present

## 2020-05-26 DIAGNOSIS — J984 Other disorders of lung: Secondary | ICD-10-CM | POA: Diagnosis present

## 2020-05-26 DIAGNOSIS — R296 Repeated falls: Secondary | ICD-10-CM | POA: Diagnosis present

## 2020-05-26 DIAGNOSIS — I480 Paroxysmal atrial fibrillation: Principal | ICD-10-CM

## 2020-05-26 DIAGNOSIS — Z8261 Family history of arthritis: Secondary | ICD-10-CM | POA: Diagnosis not present

## 2020-05-26 DIAGNOSIS — I5032 Chronic diastolic (congestive) heart failure: Secondary | ICD-10-CM | POA: Diagnosis present

## 2020-05-26 DIAGNOSIS — Z20822 Contact with and (suspected) exposure to covid-19: Secondary | ICD-10-CM | POA: Diagnosis present

## 2020-05-26 DIAGNOSIS — Z888 Allergy status to other drugs, medicaments and biological substances status: Secondary | ICD-10-CM | POA: Diagnosis not present

## 2020-05-26 DIAGNOSIS — Z7901 Long term (current) use of anticoagulants: Secondary | ICD-10-CM | POA: Diagnosis not present

## 2020-05-26 DIAGNOSIS — I2510 Atherosclerotic heart disease of native coronary artery without angina pectoris: Secondary | ICD-10-CM | POA: Diagnosis present

## 2020-05-26 DIAGNOSIS — Z96651 Presence of right artificial knee joint: Secondary | ICD-10-CM | POA: Diagnosis present

## 2020-05-26 DIAGNOSIS — E039 Hypothyroidism, unspecified: Secondary | ICD-10-CM | POA: Diagnosis present

## 2020-05-26 LAB — CBC WITH DIFFERENTIAL/PLATELET
Abs Immature Granulocytes: 0 10*3/uL (ref 0.00–0.07)
Basophils Absolute: 0 10*3/uL (ref 0.0–0.1)
Basophils Relative: 1 %
Eosinophils Absolute: 0.2 10*3/uL (ref 0.0–0.5)
Eosinophils Relative: 6 %
HCT: 33.1 % — ABNORMAL LOW (ref 36.0–46.0)
Hemoglobin: 10.6 g/dL — ABNORMAL LOW (ref 12.0–15.0)
Immature Granulocytes: 0 %
Lymphocytes Relative: 40 %
Lymphs Abs: 1.6 10*3/uL (ref 0.7–4.0)
MCH: 31.6 pg (ref 26.0–34.0)
MCHC: 32 g/dL (ref 30.0–36.0)
MCV: 98.8 fL (ref 80.0–100.0)
Monocytes Absolute: 0.4 10*3/uL (ref 0.1–1.0)
Monocytes Relative: 10 %
Neutro Abs: 1.7 10*3/uL (ref 1.7–7.7)
Neutrophils Relative %: 43 %
Platelets: 172 10*3/uL (ref 150–400)
RBC: 3.35 MIL/uL — ABNORMAL LOW (ref 3.87–5.11)
RDW: 13.7 % (ref 11.5–15.5)
WBC: 3.9 10*3/uL — ABNORMAL LOW (ref 4.0–10.5)
nRBC: 0 % (ref 0.0–0.2)

## 2020-05-26 LAB — COMPREHENSIVE METABOLIC PANEL
ALT: 21 U/L (ref 0–44)
AST: 24 U/L (ref 15–41)
Albumin: 3.3 g/dL — ABNORMAL LOW (ref 3.5–5.0)
Alkaline Phosphatase: 61 U/L (ref 38–126)
Anion gap: 10 (ref 5–15)
BUN: 15 mg/dL (ref 8–23)
CO2: 23 mmol/L (ref 22–32)
Calcium: 8.9 mg/dL (ref 8.9–10.3)
Chloride: 107 mmol/L (ref 98–111)
Creatinine, Ser: 1.48 mg/dL — ABNORMAL HIGH (ref 0.44–1.00)
GFR, Estimated: 34 mL/min — ABNORMAL LOW (ref >60)
Glucose, Bld: 87 mg/dL (ref 70–99)
Potassium: 4.2 mmol/L (ref 3.5–5.1)
Sodium: 140 mmol/L (ref 135–145)
Total Bilirubin: 0.4 mg/dL (ref 0.3–1.2)
Total Protein: 5.7 g/dL — ABNORMAL LOW (ref 6.5–8.1)

## 2020-05-26 LAB — MAGNESIUM: Magnesium: 2 mg/dL (ref 1.7–2.4)

## 2020-05-26 MED ORDER — ROPINIROLE HCL 0.5 MG PO TABS
0.2500 mg | ORAL_TABLET | Freq: Three times a day (TID) | ORAL | Status: DC
  Administered 2020-05-26 – 2020-05-27 (×4): 0.25 mg via ORAL
  Filled 2020-05-26 (×6): qty 1

## 2020-05-26 MED ORDER — PANTOPRAZOLE SODIUM 40 MG PO TBEC
40.0000 mg | DELAYED_RELEASE_TABLET | Freq: Every day | ORAL | Status: DC
  Administered 2020-05-26 – 2020-05-27 (×2): 40 mg via ORAL
  Filled 2020-05-26 (×2): qty 1

## 2020-05-26 MED ORDER — TRAMADOL HCL 50 MG PO TABS: 50.0000 mg | ORAL_TABLET | Freq: Two times a day (BID) | ORAL | Status: DC | PRN

## 2020-05-26 MED ORDER — ACETAMINOPHEN 325 MG PO TABS: 650.0000 mg | ORAL_TABLET | ORAL | Status: DC | PRN

## 2020-05-26 MED ORDER — HYDRALAZINE HCL 25 MG PO TABS
25.0000 mg | ORAL_TABLET | Freq: Two times a day (BID) | ORAL | Status: DC | PRN
  Administered 2020-05-26 (×2): 25 mg via ORAL
  Filled 2020-05-26: qty 1

## 2020-05-26 MED ORDER — VITAMIN D 25 MCG (1000 UNIT) PO TABS
1000.0000 [IU] | ORAL_TABLET | Freq: Every day | ORAL | Status: DC
  Administered 2020-05-26 – 2020-05-27 (×2): 1000 [IU] via ORAL
  Filled 2020-05-26 (×2): qty 1

## 2020-05-26 MED ORDER — AMIODARONE HCL 200 MG PO TABS
200.0000 mg | ORAL_TABLET | Freq: Two times a day (BID) | ORAL | Status: DC
  Administered 2020-05-26 – 2020-05-27 (×2): 200 mg via ORAL
  Filled 2020-05-26 (×2): qty 1

## 2020-05-26 MED ORDER — GABAPENTIN 100 MG PO CAPS
100.0000 mg | ORAL_CAPSULE | Freq: Three times a day (TID) | ORAL | Status: DC
  Administered 2020-05-26 – 2020-05-27 (×4): 100 mg via ORAL
  Filled 2020-05-26 (×4): qty 1

## 2020-05-26 MED ORDER — HYDRALAZINE HCL 25 MG PO TABS
25.0000 mg | ORAL_TABLET | Freq: Every day | ORAL | Status: DC | PRN
  Filled 2020-05-26 (×2): qty 1

## 2020-05-26 MED ORDER — ADULT MULTIVITAMIN W/MINERALS CH
1.0000 | ORAL_TABLET | Freq: Every day | ORAL | Status: DC
  Administered 2020-05-26 – 2020-05-27 (×2): 1 via ORAL
  Filled 2020-05-26 (×2): qty 1

## 2020-05-26 MED ORDER — TRAZODONE HCL 100 MG PO TABS
100.0000 mg | ORAL_TABLET | Freq: Every evening | ORAL | Status: DC | PRN
  Administered 2020-05-26: 100 mg via ORAL
  Filled 2020-05-26: qty 1

## 2020-05-26 MED ORDER — SIMVASTATIN 20 MG PO TABS: 20.0000 mg | ORAL_TABLET | Freq: Every day | ORAL | Status: DC

## 2020-05-26 MED ORDER — LEVOTHYROXINE SODIUM 75 MCG PO TABS
75.0000 ug | ORAL_TABLET | Freq: Every day | ORAL | Status: DC
  Administered 2020-05-26 – 2020-05-27 (×2): 75 ug via ORAL
  Filled 2020-05-26 (×2): qty 1

## 2020-05-26 MED ORDER — AMIODARONE HCL 200 MG PO TABS
200.0000 mg | ORAL_TABLET | Freq: Every day | ORAL | Status: DC
  Administered 2020-05-26: 200 mg via ORAL
  Filled 2020-05-26: qty 1

## 2020-05-26 MED ORDER — SODIUM CHLORIDE 0.9 % IV SOLN: INTRAVENOUS | Status: DC

## 2020-05-26 MED ORDER — APIXABAN 2.5 MG PO TABS
2.5000 mg | ORAL_TABLET | Freq: Two times a day (BID) | ORAL | Status: DC
  Administered 2020-05-26 – 2020-05-27 (×3): 2.5 mg via ORAL
  Filled 2020-05-26 (×5): qty 1

## 2020-05-26 MED ORDER — SODIUM CHLORIDE 0.9 % IV SOLN
1.0000 g | INTRAVENOUS | Status: DC
  Administered 2020-05-26: 1 g via INTRAVENOUS
  Filled 2020-05-26: qty 10

## 2020-05-26 MED ORDER — ONDANSETRON HCL 4 MG/2ML IJ SOLN: 4.0000 mg | Freq: Four times a day (QID) | INTRAMUSCULAR | Status: DC | PRN

## 2020-05-26 MED ORDER — ALPRAZOLAM 0.25 MG PO TABS
0.2500 mg | ORAL_TABLET | Freq: Three times a day (TID) | ORAL | Status: DC | PRN
  Administered 2020-05-26: 0.25 mg via ORAL
  Filled 2020-05-26: qty 1

## 2020-05-26 NOTE — Consult Note (Addendum)
Cardiology Consultation:   Patient ID: Jacqueline [name] MRN: [id number]; DOB: 01-14-1931  Admit date: 05/25/2020 Date of Consult: 05/26/2020  Primary Care Provider: [name], MD [facility] Cardiologist: Sanda Klein, MD  [facility] Electrophysiologist:  None    Patient Profile:   Jacqueline [name] is a 86 y.o. female with a hx of CAD and CABG-2004, PAF with DCCV last one in July 9326, diastolic HF and HTN. who is being seen today for the evaluation of PAF at the request of Dr. Roosevelt Locks.  History of Present Illness:   [name] with hx as above and volatile BP with dizziness, last seen in office 05/21/20 with 3 falls.  2 most likely attributed to orthostatic hypotension.  systolic BP between 712 to 154.  Hx of GI bleeding in 2020 but has maintained Sr on amiodarone since 01/2019.  She is on eliquis.  PAH most likely secondary to lt heart diastolic failure.  CKD 4-3b, cr baseline around 1.7.    CAD with off pump CABG with LIMA to LAD and rSVG to diagonal.  Last cath 2006 with patent VG tyo D-2, and LIMA to LAD was patent but atretic. RCA and LCX normal.  LAD with diffuse disease.  nuc study in 2019 was now risk.  TEE 01/2019 with mildly depressed EF,  Moderate MR, trivial TR.  Pt called with belief she was in a fib, no chest pain and no SOB mainly fatigue.  With her hx or orthostatic hypotension difficult to manage meds.  When not better later in day she was instructed to come to ER.   On her work up she was found to be in a fib with RVR of 128, she did convert to SR she has not missed any eliquis.  She was found to have UTI as well and was placed on Rocephin.    EKG:  The EKG was personally reviewed and demonstrates: 05/25/20 a flutter at 92, no acute ST changes,  Follow up yesterday SB at 57 no acute ST changes,  Telemetry:  Telemetry was personally reviewed and demonstrates:  SR   Na 140, K+ 4.2, Cr 1.48, mg+ 2.0  Hs troponin 9, 10 BNP 284 Hgb 10.6, HCT 33.1 WBC 3.9  plts 172 TSH 1.933 COVID neg + UTI  CXR IMPRESSION: Thin walled air-filled cystic structure projecting over the right hilum, noted posteriorly on the lateral view. This could reflect a small pneumatocele or cavitary lesion. This could be further evaluated with chest CT if felt clinically indicated  Follow up CXR today  IMPRESSION: 1. No active cardiopulmonary disease. 2. No change from the prior study. Thin walled air cyst in the right lower lobe superior segment, stable.  Currently just walked in hall with PT no chest pain or SOB feels well.   Past Medical History:  Diagnosis Date  . Arthritis    back  . Atrial fibrillation with rapid ventricular response ([place]) 02/2014   a. CHA2DS2VASc = 6 -> on eliquis;  b. 02/2014 s/p DCCV;  c. 08/2014 Echo: EF 55-60%, Gr 2 DD, mild MR, triv AI.  Marland Kitchen CAD (coronary artery disease)    a. s/p CABG x 2 (LIMA->LAD, VG->Diag);  b. 08/2014 Cath: LM nl, LAD 90p, LCX nl, RCA nl/dominant, LIMA->LAD atretic, VG->D2 patent w/ retrograde filling of LAD, EF 55-60%-->Med Rx.  . Diverticulitis 02/21/2012  . GERD (gastroesophageal reflux disease)   . Hyperlipemia   . Hypertension   . Insomnia     Past Surgical History:  Procedure Laterality Date  . ABDOMINAL HYSTERECTOMY  1975  . CARDIAC CATHETERIZATION N/A 05/09/2015   Procedure: Right Heart Cath;  Surgeon: Larey Dresser, MD;  Location: [place] CV LAB;  Service: Cardiovascular;  Laterality: N/A;  . CARDIOVERSION N/A 02/28/2014   Procedure: CARDIOVERSION;  Surgeon: Sanda Klein, MD;  Location: [place] ENDOSCOPY;  Service: Cardiovascular;  Laterality: N/A;  . CARDIOVERSION N/A 01/30/2019   Procedure: CARDIOVERSION;  Surgeon: Fay Records, MD;  Location: [facility] ENDOSCOPY;  Service: Cardiovascular;  Laterality: N/A;  . CATARACT EXTRACTION Bilateral 2014   with lens implanted  . COLONOSCOPY N/A 02/19/2015   Procedure: COLONOSCOPY;  Surgeon: Ladene Artist, MD;  Location: [facility] ENDOSCOPY;  Service: Endoscopy;   Laterality: N/A;  . CORONARY ARTERY BYPASS GRAFT  2006   off-pump bypass surgery with LIMA to the LAD and SVG to the second diagonal artery ( performed by Dr Servando Snare)   . JOINT REPLACEMENT Bilateral    L-2004, R-2006  . LEFT HEART CATHETERIZATION WITH CORONARY/GRAFT ANGIOGRAM N/A 09/17/2014   Procedure: LEFT HEART CATHETERIZATION WITH Beatrix Fetters;  Surgeon: Troy Sine, MD;  Location: [facility] CATH LAB;  Service: Cardiovascular;  Laterality: N/A;  . REIMPLANTATION OF TOTAL KNEE Right 10/08/2014   Procedure: REIMPLANTATION OF RIGHT TOTAL KNEE ARTHROPLASTY WITH REMOVAL OF ANTIBIOTIC SPACER;  Surgeon: Paralee Cancel, MD;  Location: [facility] ORS;  Service: Orthopedics;  Laterality: Right;  . ROTATOR CUFF REPAIR Bilateral    r-1999, l- 2005  . TEE WITHOUT CARDIOVERSION N/A 02/28/2014   Procedure: TRANSESOPHAGEAL ECHOCARDIOGRAM (TEE);  Surgeon: Sanda Klein, MD;  Location: [place];  Service: Cardiovascular;  Laterality: N/A;  . TEE WITHOUT CARDIOVERSION N/A 01/30/2019   Procedure: TRANSESOPHAGEAL ECHOCARDIOGRAM (TEE);  Surgeon: Fay Records, MD;  Location: [place];  Service: Cardiovascular;  Laterality: N/A;  . TOTAL KNEE ARTHROPLASTY Right 07/02/2014   Procedure: Resection of Infected Right Total Knee Arthroplasty with placement antibiotic spacer;  Surgeon: Mauri Pole, MD;  Location: [facility] ORS;  Service: Orthopedics;  Laterality: Right;     Home Medications:  Prior to Admission medications   Medication Sig Start Date End Date Taking? Authorizing Provider  acetaminophen (TYLENOL) 500 MG tablet Take 500 mg by mouth every 6 (six) hours as needed for moderate pain (pain/headache.).    Yes [provider]  ALPRAZolam (XANAX) 0.5 MG tablet TAKE 1 TABLET BY MOUTH TWICE A DAY AS NEEDED FOR ANXIETY Patient taking differently: Take 0.5 mg by mouth 2 (two) times daily as needed for anxiety or sleep.  01/02/20  Yes Hensel, Jamal Collin, MD  amiodarone (PACERONE) 200 MG tablet Take 1 tablet (200  mg total) by mouth daily. 06/08/19  Yes Hensel, Jamal Collin, MD  apixaban (ELIQUIS) 2.5 MG TABS tablet Take 1 tablet (2.5 mg total) by mouth 2 (two) times daily. 05/20/20  Yes Croitoru, Mihai, MD  cholecalciferol (VITAMIN D3) 25 MCG (1000 UNIT) tablet Take 1,000 Units by mouth daily.   Yes [provider]  cycloSPORINE (RESTASIS) 0.05 % ophthalmic emulsion Place 1 drop into both eyes 2 (two) times daily.   Yes [provider]  gabapentin (NEURONTIN) 100 MG capsule TAKE 1 CAPSULE (100 MG TOTAL) BY MOUTH 3 (THREE) TIMES DAILY. 05/15/20  Yes Hensel, Jamal Collin, MD  hydrALAZINE (APRESOLINE) 25 MG tablet TAKE 1 TABLET BY MOUTH EVERY DAY FOR SBP ABOVE 160 AS NEEDED UP TO 2 TABS DAILY 04/15/20  Yes Cleaver, Jossie Ng, NP  levothyroxine (SYNTHROID) 75 MCG tablet TAKE 1 TABLET BY MOUTH EVERY DAY  05/06/20  Yes Hensel, Jamal Collin, MD  meclizine (ANTIVERT) 12.5 MG tablet Take 1 tablet (12.5 mg total) by mouth 3 (three) times daily as needed for dizziness. 04/11/20  Yes Hensel, Jamal Collin, MD  Multiple Vitamin (MULTIVITAMIN WITH MINERALS) TABS tablet Take 1 tablet by mouth daily.   Yes [provider]  nitroGLYCERIN (NITROSTAT) 0.4 MG SL tablet PLACE 1 TABLET UNDER THE TONGUE EVERY 5 MINUTES AS NEEDED FOR CHEST PAIN UP TO 3 DOSES Patient taking differently: Place 0.4 mg under the tongue every 5 (five) minutes x 3 doses as needed for chest pain. PLACE 1 TABLET UNDER THE TONGUE EVERY 5 MINUTES AS NEEDED FOR CHEST PAIN UP TO 3 DOSES 10/20/18  Yes Croitoru, Mihai, MD  Omega-3 Fatty Acids (FISH OIL) 1000 MG CAPS Take 1,000 mg by mouth 2 (two) times a day.    Yes [provider]  omeprazole (PRILOSEC) 20 MG capsule TAKE 1 CAPSULE BY MOUTH EVERY DAY 11/22/19  Yes Hensel, Jamal Collin, MD  Polyethyl Glycol-Propyl Glycol (LUBRICANT EYE DROPS) 0.4-0.3 % SOLN Place 1-2 drops into both eyes 3 (three) times daily as needed (dry/irritated eyes.).   Yes [provider]  polyethylene glycol powder  ([employer] PURELAX) 17 GM/SCOOP powder MIX 17 G AND TAKE BY MOUTH DAILY. Patient taking differently: Take 1 Container by mouth 3 (three) times a week.  04/22/20  Yes Hensel, Jamal Collin, MD  rOPINIRole (REQUIP) 0.25 MG tablet TAKE 1 TABLET BY MOUTH THREE TIMES A DAY 05/15/20  Yes Hensel, Jamal Collin, MD  simvastatin (ZOCOR) 20 MG tablet TAKE 1 TABLET BY MOUTH EVERYDAY AT BEDTIME Patient taking differently: Take 20 mg by mouth every evening.  07/19/19  Yes Dickie La, MD  traMADol (ULTRAM) 50 MG tablet TAKE 1 TABLET BY MOUTH EVERY 12 HOURS AS NEEDED FOR PAIN Patient taking differently: Take 50 mg by mouth every 12 (twelve) hours as needed for moderate pain or severe pain.  05/06/20  Yes Hensel, Jamal Collin, MD  traZODone (DESYREL) 100 MG tablet TAKE 1 TABLET BY MOUTH EVERYDAY AT BEDTIME Patient taking differently: Take 100 mg by mouth at bedtime.  11/24/19  Yes [name], MD    Inpatient Medications: Scheduled Meds: . amiodarone  200 mg Oral Daily  . apixaban  2.5 mg Oral BID  . cholecalciferol  1,000 Units Oral Daily  . gabapentin  100 mg Oral TID  . levothyroxine  75 mcg Oral Daily  . multivitamin with minerals  1 tablet Oral Daily  . pantoprazole  40 mg Oral Daily  . rOPINIRole  0.25 mg Oral TID  . simvastatin  20 mg Oral q1800   Continuous Infusions: . cefTRIAXone (ROCEPHIN)  IV     PRN Meds: acetaminophen, ALPRAZolam, hydrALAZINE, ondansetron (ZOFRAN) IV, traMADol, traZODone  Allergies:    Allergies  Allergen Reactions  . Atorvastatin Other (See Comments)    Myalgias in legs  . Beta Adrenergic Blockers Other (See Comments)    Severe bradycardia  . Crestor [Rosuvastatin] Other (See Comments)    Myalgias in legs  . Morphine And Related Other (See Comments)    "Crazy thoughts, severe headache, sick"  . Prednisone     On two occasions has caused A fib    Social History:   Social History   Socioeconomic History  . Marital status: Widowed    Spouse name: Not on file  .  Number of children: 2  . Years of education: 10  . Highest education level: 10th grade  Occupational History  . Not on file  Tobacco Use  . Smoking status: Never Smoker  . Smokeless tobacco: Never Used  Vaping Use  . Vaping Use: Never used  Substance and Sexual Activity  . Alcohol use: No  . Drug use: No  . Sexual activity: Not Currently    Birth control/protection: Post-menopausal  Other Topics Concern  . 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 Drinks water and one cup of coffee daily.   Religious / Personal Beliefs: Baptist. "I believe Jesus died on the cross to save me [id number] my sins. I am saved by the grace of God."    Other: "I have a good life and am happy. My family is there when I need help."                                                              Social Determinants of Health   Financial Resource Strain:   . Difficulty of Paying Living Expenses: Not on file  Food Insecurity:   . Worried About Charity fundraiser in the Last Year: Not on file  . Ran Out of Food in the Last Year: Not on file  Transportation Needs:   . Lack of Transportation (Medical): Not on file  . Lack of Transportation (Non-Medical): Not on file  Physical Activity:   . Days of Exercise per Week: Not on file  . Minutes of Exercise per Session: Not on file  Stress:   . Feeling of Stress : Not on file  Social Connections:   . Frequency of Communication with Friends and Family: Not on file  . Frequency of Social Gatherings with  Friends and Family: Not on file  . Attends Religious Services: Not on file  . Active Member of Clubs or Organizations: Not on file  . Attends Archivist Meetings: Not on file  . Marital Status: Not on file  Intimate Partner Violence:   . Fear of Current or Ex-Partner: Not on file  . Emotionally Abused: Not on file  . Physically Abused: Not on file  . Sexually Abused: Not on file    Family History:    Family History  Problem Relation Age of Onset  . Parkinson's disease Mother   . Heart attack Brother   . Arthritis Brother   . Cancer Brother        Unknown  . Breast cancer Neg Hx      ROS:  Please see the history of present illness.  General:no colds or fevers, no weight changes Skin:no rashes or ulcers HEENT:no blurred vision, no congestion CV:see HPI PUL:see HPI GI:no diarrhea constipation or melena, no indigestion GU:no hematuria, mild dysuria MS:no joint pain, no claudication Neuro:no syncope, no lightheadedness Endo:no diabetes, no thyroid  disease  All other ROS reviewed and negative.     Physical Exam/Data:   Vitals:   05/26/20 0600 05/26/20 0627 05/26/20 0700 05/26/20 0852  BP: (!) 162/72 (!) 162/72 (!) 174/76 (!) 147/70  Pulse: (!) 56 (!) 56 (!) 54 (!) 57  Resp: 18 16 16  (!) 23  Temp:      TempSrc:      SpO2: 95% 97% 94% 98%    Intake/Output Summary (Last 24 hours) at 05/26/2020 1001 Last data filed at 05/26/2020 0852 Gross per 24 hour  Intake 315.62 ml  Output --  Net 315.62 ml   Last 3 Weights 05/21/2020 04/10/2020 01/15/2020  Weight (lbs) 132 lb 132 lb 12.8 oz 130 lb 6.4 oz  Weight (kg) 59.875 kg 60.238 kg 59.149 kg     There is no height or weight on file to calculate BMI.  General:  Thin female in no acute distress HEENT: normal Lymph: no adenopathy Neck: no JVD Endocrine:  No thryomegaly Vascular: No carotid bruits; pedal pulses 2+ bilaterally without bruits  Cardiac:  normal S1, S2; RRR; no murmur gallup rub or clcik Lungs:   clear to auscultation bilaterally, no wheezing, rhonchi or rales  Abd: soft, nontender, no hepatomegaly  Ext: no edema Musculoskeletal:  No deformities, BUE and BLE strength normal and equal Skin: warm and dry  Neuro:  Alert and oriented X 3 MAE follows commands, no focal abnormalities noted Psych:  Normal affect    Relevant CV Studies: 01/30/20  TEE IMPRESSIONS    1. LVEF is mildly depressed.  2. LA, LAA without masses. No PFO by color doppler.  3. MR is at least moderate and is centrally directed.  4. The aortic valve is tricuspid Mild thickening of the aortic valve.  Aortic valve regurgitation is trivial by color flow Doppler.  5. There is evidence of plaque in the descending aorta.   FINDINGS  Left Ventricle: LVEF is mildly depressed.   Left Atrium: LA, LAA without masses. No PFO by color doppler.    Interatrial Septum: Saline contrast bubble study was negative, with no  evidence of any interatrial shunt.   Mitral Valve: The mitral valve is normal in structure. MR is at least  moderate and is centrally directed.   Tricuspid Valve: The tricuspid valve was normal in structure. Tricuspid  valve regurgitation is trivial by color flow Doppler.   Aortic Valve: The aortic valve is tricuspid Mild thickening of the aortic  valve. Aortic valve regurgitation is trivial by color flow Doppler.   Pulmonic Valve: The pulmonic valve was grossly normal.   Aorta: There is evidence of plaque in the descending aorta.       Laboratory Data:  High Sensitivity Troponin:   Recent Labs  Lab 05/25/20 1919 05/25/20 2215  TROPONINIHS 9 10     Chemistry Recent Labs  Lab 05/25/20 1919 05/26/20 0421  NA 136 140  K 4.1 4.2  CL 102 107  CO2 23 23  GLUCOSE 118* 87  BUN 15 15  CREATININE 1.74* 1.48*  CALCIUM 9.2 8.9  GFRNONAA 28* 34*  ANIONGAP 11 10    Recent Labs  Lab 05/26/20 0421  PROT 5.7*  ALBUMIN 3.3*  AST 24  ALT 21  ALKPHOS 61  BILITOT 0.4    Hematology Recent Labs  Lab 05/25/20 1919 05/26/20 0421  WBC 4.7 3.9*  RBC 3.78* 3.35*  HGB 11.9* 10.6*  HCT 36.9 33.1*  MCV 97.6 98.8  MCH 31.5 31.6  MCHC 32.2 32.0  RDW 13.4 13.7  PLT 214 172   BNP Recent Labs  Lab 05/25/20 2022  BNP 284.2*    DDimer No results for input(s): DDIMER in the last 168 hours.   Radiology/Studies:  X-ray chest PA and lateral  Result Date: 05/26/2020 CLINICAL DATA:  Pneumatocele of the lung. EXAM: CHEST - 2 VIEW COMPARISON:  05/25/2020 FINDINGS: Thin walled, 3.7 cm, cyst lies in the superior segment of the right lower lobe, unchanged, consistent with a pneumatocele. Minor scarring noted at the apices.  Lungs otherwise clear. No pleural effusion or pneumothorax. Stable changes from prior CABG surgery. Cardiac silhouette is normal in size. No mediastinal or hilar masses or evidence of adenopathy. Skeletal structures are demineralized but grossly intact. IMPRESSION: 1. No active cardiopulmonary disease. 2. No change from the prior study. Thin walled air cyst in the right lower lobe superior segment, stable. Electronically Signed   By: Lajean Manes M.D.   On: 05/26/2020 06:33   DG Chest 2 View  Result Date: 05/25/2020 CLINICAL DATA:  Palpitations EXAM: CHEST - 2 VIEW COMPARISON:  09/11/2017 FINDINGS: Prior CABG. Heart is normal size. Possible air-filled cystic area projects over the right hilum. This is new since prior study. This conceivably could be artifactual, external to the patient. Left lung clear. No effusions. IMPRESSION: Thin walled air-filled cystic structure projecting over the right hilum, noted posteriorly on the lateral view. This could reflect a small pneumatocele or cavitary lesion. This could be further evaluated with chest CT if felt clinically indicated. Electronically Signed   By: Rolm Baptise M.D.   On: 05/25/2020 19:45     Assessment and Plan:   1. PAF first episode since 01/2019, on po amiodarone 200 mg daily, has not been on  BB due to bradycardia in SR, also with orthostatic hypotension. Now back in SR to SB- may have been due to UTI on eliquis  Increase amiodarone for 2 weeks and follow in office in 10-14 days.   2. UTI on ABX per IM  3. CAD with hx of off pump CABG 2004.  Stable grafts last cath in 2006 and normal nuc in 2019, troponin neg now and EKG without acute changes.  4. Labile/orthostatic hypotension, recent stopping of IMDUR, on apresoline prn,   5. HLD on zocor  6. CXR with pneumatocele             CHA2DS2-VASc Score = 6  This indicates a 9.7% annual risk of stroke. The patient's score is based upon: CHF History: 1 HTN History: 1 Diabetes History: 0 Stroke History: 0 Vascular Disease History: 1 Age Score: 2 Gender Score: 1         For questions or updates, please contact [place] Please consult [email or web address] for contact info under    Signed, Cecilie Kicks, NP  05/26/2020 10:01 AM  Personally seen and examined. Agree with above.   Now in sinus rhythm, sinus bradycardia heart rate 58 bpm.  Feels well.  Overall has felt some weakness at home.  Has had issues in the past with orthostatic hypotension.  Telemetry personally reviewed shows break of atrial fibrillation now sinus bradycardia.  Prior CAD appears stable.  Paroxysmal atrial fibrillation -We will increase her amiodarone to 200 mg twice a day for the next 2 weeks.  Close clinic follow-up.  Watch for any signs of worsening bradycardia.  1 could also consider short acting diltiazem 30 mg on an as-needed basis if she does develop a tach arrhythmia at home.  She has  had orthostasis however in the past.  Continue with Eliquis for chronic anticoagulation.  Chest x-ray reviewed-pneumatocele-she has not had any cough or recent fevers.  Unsure of clinical significance.  UTI-Rocephin.  Okay for discharge from a cardiology perspective.  We will get close follow-up in the next 2 weeks.  Candee Furbish, MD

## 2020-05-26 NOTE — Evaluation (Signed)
Physical Therapy Evaluation Patient Details Name: Jacqueline [name] MRN: [id number] DOB: [birth year] Today's Date: 05/26/2020   History of Present Illness  Pt presented to ED with weakness and falls. Pt with afib with RVR and UTI. Pt converted to sinus spontaneously. PMH - HTN, CAD, afib, arthritis, CABG, Rt TKR,   Clinical Impression  Pt able to amb with steady gait using walker. No orthostasis (see vitals for orthostatics). Recommend return home when medically ready. Recommend pt continue to mobilize while here with nursing to manage lines if needed.     Follow Up Recommendations No PT follow up    Equipment Recommendations  None recommended by PT    Recommendations for Other Services       Precautions / Restrictions Precautions Precautions: Fall      Mobility  Bed Mobility Overal bed mobility: Modified Independent                  Transfers Overall transfer level: Modified independent Equipment used: Rolling walker (2 wheeled)                Ambulation/Gait Ambulation/Gait assistance: Modified independent (Device/Increase time) Gait Distance (Feet): 250 Feet Assistive device: Rolling walker (2 wheeled) Gait Pattern/deviations: Step-through pattern;Decreased stride length Gait velocity: Age appropriate Gait velocity interpretation: >2.62 ft/sec, indicative of community ambulatory General Gait Details: Steady gait with walke  Stairs            Wheelchair Mobility    Modified Rankin (Stroke Patients Only)       Balance Overall balance assessment: Mild deficits observed, not formally tested                                           Pertinent Vitals/Pain Pain Assessment: No/denies pain (except chronic back pain)    Home Living Family/patient expects to be discharged to:: Private residence Living Arrangements: Alone Available Help at Discharge: Family;Available PRN/intermittently Type of Home: House Home Access: Stairs  to enter Entrance Stairs-Rails: Psychiatric nurse of Steps: 2 Home Layout: One level Home Equipment: Cane - single point;Bedside commode;Walker - 4 wheels      Prior Function Level of Independence: Independent with assistive device(s)         Comments: Uses rollator or cane.       Hand Dominance   Dominant Hand: Right    Extremity/Trunk Assessment   Upper Extremity Assessment Upper Extremity Assessment: Overall WFL for tasks assessed    Lower Extremity Assessment Lower Extremity Assessment: Overall WFL for tasks assessed       Communication   Communication: No difficulties  Cognition Arousal/Alertness: Awake/alert Behavior During Therapy: WFL for tasks assessed/performed Overall Cognitive Status: Within Functional Limits for tasks assessed                                        General Comments General comments (skin integrity, edema, etc.): VSS. See vitals flowsheets for orthostatics    Exercises     Assessment/Plan    PT Assessment Patent does not need any further PT services  PT Problem List         PT Treatment Interventions      PT Goals (Current goals can be found in the Care Plan section)  Acute Rehab PT Goals PT Goal Formulation: All assessment  and education complete, DC therapy    Frequency     Barriers to discharge        Co-evaluation               AM-PAC PT "6 Clicks" Mobility  Outcome Measure Help needed turning from your back to your side while in a flat bed without using bedrails?: None Help needed moving from lying on your back to sitting on the side of a flat bed without using bedrails?: None Help needed moving to and from a bed to a chair (including a wheelchair)?: None Help needed standing up from a chair using your arms (e.g., wheelchair or bedside chair)?: None Help needed to walk in hospital room?: None Help needed climbing 3-5 steps with a railing? : None 6 Click Score: 24    End  of Session   Activity Tolerance: Patient tolerated treatment well Patient left: in bed;with call bell/phone within reach;with family/visitor present (stretcher) Nurse Communication: Mobility status (EMT) PT Visit Diagnosis: Repeated falls (R29.6)    Time: 0254-2706 PT Time Calculation (min) (ACUTE ONLY): 22 min   Charges:   PT Evaluation $PT Eval Low Complexity: [place] Pager [phone] Office [place] 05/26/2020, 10:50 AM

## 2020-05-26 NOTE — H&P (Signed)
TRH H&P    Patient Demographics:    Jacqueline [name], is a 86 y.o. female  MRN: [id number]  DOB - [birth year]  Admit Date - 05/25/2020  Referring MD/NP/PA: Ralene Bathe  Outpatient Primary MD for the patient is Hensel, Jamal Collin, MD  Patient coming from: Home  Chief complaint- Generalized weakness   HPI:    Jacqueline [name]  is a 86 y.o. female, with history of insomnia, hypertension, hyperlipidemia, GERD, diverticulitis, coronary artery disease, atrial fibrillation, and more who presents the ED with a chief complaint of generalized weakness.  Patient reports that she has been feeling weak for at least 4 to 5 weeks.  It is been getting worse.  She has had 3 falls in the last 3 weeks.  These falls do sound orthostatic.  she also reported this to her cardiologist when she saw him on October 26.  Patient reports that she is also had associated tremors.  Her cardiologist attributed her symptoms to orthostasis and reported that it was likely due to her consistently low diastolic blood pressure.  Patient reportedly keeps excellent records of her daily blood pressures and when he reviewed these the diastolic was 40-98 every time.  Due to her coronary artery disease patient had been on Imdur, but the cardiologist reported it was a tiny dose and discontinued it as he thought this is likely the culprit for her low diastolic blood pressures.  Patient continued to feel bad after that medication was discontinued.  She takes her blood pressure and heart rate every day.  Today when she checked her heart rate it was 121, her blood pressure is 144/74.  She called her cardiology office and talk to the person on call.  She discussed her symptoms and her heart rate and reportedly was advised to go to the ED.  Patient denies chest pain and shortness of breath.  She has not had any cough or fever.  The only thing she has noticed on review of systems is a  decrease in urine output.  She thinks this has been going on for the last 2 weeks.  She has not noticed any hematuria or malodorous urine.  And she definitely does not have frequency or urgency.  Of note patient's chart reports that she has an allergy to beta-blockers.  We discussed this and she does not remember what happens when she takes a beta-blocker.    Also of note patient reports that her heart rate is bradycardic when she is not in A. fib.  She reports that she has heart rates in the 40s and 50s when she is in sinus rhythm.  In the ED Temperature 97.9, heart rate 55-128, respiratory rate 14-21, blood pressure 156/70, satting at 97% UA is indicative of UTI, urine culture pending Negative Covid and flu TSH is 1.933 BNP is 28.42 EKG shows a heart rate of 57, sinus rhythm, QTC 420 -this is after she converted White blood cell count 4.7, hemoglobin 11.9 CHEM panel is mostly unremarkable and shows a BUN of 15 and a creatinine of 1.74 this  is very close to her baseline Chest x-ray shows a small pneumocele or cavitary lesion and recommends nonemergent further chest imaging Repeat chest x-ray to reevaluate is ordered for the a.m. -May need chest CT depending on result of morning x-ray  Chart review Patient last saw cards on October 26.  Imdur was discontinued, amiodarone and Eliquis were continued. Patient did have a cardioversion in July 2020 for protracted episode of A. Fib.    Review of systems:    In addition to the HPI above,  No Fever-chills, No Headache, No changes with Vision or hearing, No problems swallowing food or Liquids, No Chest pain, Cough or Shortness of Breath, does admit to dizziness No Abdominal pain, No Nausea or Vomiting, bowel movements are regular, No Blood in stool or Urine, No dysuria, No new skin rashes or bruises, No new joints pains-aches,  Admits to generalized weakness, but no asymmetric weakness, tingling, or numbness in any extremity, No recent  weight gain or loss, No polyuria, polydypsia or polyphagia, No significant Mental Stressors.  All other systems reviewed and are negative.    Past History of the following :    Past Medical History:  Diagnosis Date  . Arthritis    back  . Atrial fibrillation with rapid ventricular response ([place]) 02/2014   a. CHA2DS2VASc = 6 -> on eliquis;  b. 02/2014 s/p DCCV;  c. 08/2014 Echo: EF 55-60%, Gr 2 DD, mild MR, triv AI.  Marland Kitchen CAD (coronary artery disease)    a. s/p CABG x 2 (LIMA->LAD, VG->Diag);  b. 08/2014 Cath: LM nl, LAD 90p, LCX nl, RCA nl/dominant, LIMA->LAD atretic, VG->D2 patent w/ retrograde filling of LAD, EF 55-60%-->Med Rx.  . Diverticulitis 02/21/2012  . GERD (gastroesophageal reflux disease)   . Hyperlipemia   . Hypertension   . Insomnia       Past Surgical History:  Procedure Laterality Date  . ABDOMINAL HYSTERECTOMY  1975  . CARDIAC CATHETERIZATION N/A 05/09/2015   Procedure: Right Heart Cath;  Surgeon: Larey Dresser, MD;  Location: [facility];  Service: Cardiovascular;  Laterality: N/A;  . CARDIOVERSION N/A 02/28/2014   Procedure: CARDIOVERSION;  Surgeon: Sanda Klein, MD;  Location: [facility];  Service: Cardiovascular;  Laterality: N/A;  . CARDIOVERSION N/A 01/30/2019   Procedure: CARDIOVERSION;  Surgeon: Fay Records, MD;  Location: [facility] ENDOSCOPY;  Service: Cardiovascular;  Laterality: N/A;  . CATARACT EXTRACTION Bilateral 2014   with lens implanted  . COLONOSCOPY N/A 02/19/2015   Procedure: COLONOSCOPY;  Surgeon: Ladene Artist, MD;  Location: [facility] END[facility]COPY;  Service: Endoscopy;  Laterality: N/A;  . CORONARY ARTERY BYPASS GRAFT  2006   off-pump bypass surgery with LIMA to the LAD and SVG to the second diagonal artery ( performed by Dr Servando Snare)   . JOINT REPLACEMENT Bilateral    L-2004, R-2006  . LEFT HEART CATHETERIZATION WITH CORONARY/GRAFT ANGIOGRAM N/A 09/17/2014   Procedure: LEFT HEART CATHETERIZATION WITH Beatrix Fetters;  Surgeon: Troy Sine, MD;  Location: [facility] CATH LAB;  Service: Cardiovascular;  Laterality: N/A;  . REIMPLANTATION OF TOTAL KNEE Right 10/08/2014   Procedure: REIMPLANTATION OF RIGHT TOTAL KNEE ARTHROPLASTY WITH REMOVAL OF ANTIBIOTIC SPACER;  Surgeon: Paralee Cancel, MD;  Location: [facility];  Service: Orthopedics;  Laterality: Right;  . ROTATOR CUFF REPAIR Bilateral    r-1999, l- 2005  . TEE WITHOUT CARDIOVERSION N/A 02/28/2014   Procedure: TRANSESOPHAGEAL ECHOCARDIOGRAM (TEE);  Surgeon: Sanda Klein, MD;  Location: [facility];  Service: Cardiovascular;  Laterality: N/A;  .  TEE WITHOUT CARDIOVERSION N/A 01/30/2019   Procedure: TRANSESOPHAGEAL ECHOCARDIOGRAM (TEE);  Surgeon: Fay Records, MD;  Location: [facility];  Service: Cardiovascular;  Laterality: N/A;  . TOTAL KNEE ARTHROPLASTY Right 07/02/2014   Procedure: Resection of Infected Right Total Knee Arthroplasty with placement antibiotic spacer;  Surgeon: Mauri Pole, MD;  Location: [facility];  Service: Orthopedics;  Laterality: Right;      Social History:      Social History   Tobacco Use  . Smoking status: Never Smoker  . Smokeless tobacco: Never Used  Substance Use Topics  . Alcohol use: No       Family History :     Family History  Problem Relation Age of Onset  . Parkinson's disease Mother   . Heart attack Brother   . Arthritis Brother   . Cancer Brother        Unknown  . Breast cancer Neg Hx       Home Medications:   Prior to Admission medications   Medication Sig Start Date End Date Taking? Authorizing Provider  acetaminophen (TYLENOL) 500 MG tablet Take 500 mg by mouth every 6 (six) hours as needed for moderate pain (pain/headache.).    Yes [provider]  ALPRAZolam (XANAX) 0.5 MG tablet TAKE 1 TABLET BY MOUTH TWICE A DAY AS NEEDED FOR ANXIETY Patient taking differently: Take 0.5 mg by mouth 2 (two) times daily as needed for anxiety or sleep.  01/02/20  Yes Hensel, Jamal Collin, MD  amiodarone (PACERONE) 200 MG tablet Take  1 tablet (200 mg total) by mouth daily. 06/08/19  Yes Hensel, Jamal Collin, MD  apixaban (ELIQUIS) 2.5 MG TABS tablet Take 1 tablet (2.5 mg total) by mouth 2 (two) times daily. 05/20/20  Yes Croitoru, Mihai, MD  cholecalciferol (VITAMIN D3) 25 MCG (1000 UNIT) tablet Take 1,000 Units by mouth daily.   Yes [provider]  cycloSPORINE (RESTASIS) 0.05 % ophthalmic emulsion Place 1 drop into both eyes 2 (two) times daily.   Yes [provider]  gabapentin (NEURONTIN) 100 MG capsule TAKE 1 CAPSULE (100 MG TOTAL) BY MOUTH 3 (THREE) TIMES DAILY. 05/15/20  Yes Hensel, Jamal Collin, MD  hydrALAZINE (APRESOLINE) 25 MG tablet TAKE 1 TABLET BY MOUTH EVERY DAY FOR SBP ABOVE 160 AS NEEDED UP TO 2 TABS DAILY 04/15/20  Yes Cleaver, Jossie Ng, NP  levothyroxine (SYNTHROID) 75 MCG tablet TAKE 1 TABLET BY MOUTH EVERY DAY 05/06/20  Yes Hensel, Jamal Collin, MD  meclizine (ANTIVERT) 12.5 MG tablet Take 1 tablet (12.5 mg total) by mouth 3 (three) times daily as needed for dizziness. 04/11/20  Yes Hensel, Jamal Collin, MD  Multiple Vitamin (MULTIVITAMIN WITH MINERALS) TABS tablet Take 1 tablet by mouth daily.   Yes [provider]  nitroGLYCERIN (NITROSTAT) 0.4 MG SL tablet PLACE 1 TABLET UNDER THE TONGUE EVERY 5 MINUTES AS NEEDED FOR CHEST PAIN UP TO 3 DOSES Patient taking differently: Place 0.4 mg under the tongue every 5 (five) minutes x 3 doses as needed for chest pain. PLACE 1 TABLET UNDER THE TONGUE EVERY 5 MINUTES AS NEEDED FOR CHEST PAIN UP TO 3 DOSES 10/20/18  Yes Croitoru, Mihai, MD  Omega-3 Fatty Acids (FISH OIL) 1000 MG CAPS Take 1,000 mg by mouth 2 (two) times a day.    Yes [provider]  omeprazole (PRILOSEC) 20 MG capsule TAKE 1 CAPSULE BY MOUTH EVERY DAY 11/22/19  Yes Hensel, Jamal Collin, MD  Polyethyl Glycol-Propyl Glycol (LUBRICANT EYE DROPS) 0.4-0.3 % SOLN Place  1-2 drops into both eyes 3 (three) times daily as needed (dry/irritated eyes.).   Yes [provider]  polyethylene  glycol powder ([employer] PURELAX) 17 GM/SCOOP powder MIX 17 G AND TAKE BY MOUTH DAILY. Patient taking differently: Take 1 Container by mouth 3 (three) times a week.  04/22/20  Yes Hensel, Jamal Collin, MD  rOPINIRole (REQUIP) 0.25 MG tablet TAKE 1 TABLET BY MOUTH THREE TIMES A DAY 05/15/20  Yes Hensel, Jamal Collin, MD  simvastatin (ZOCOR) 20 MG tablet TAKE 1 TABLET BY MOUTH EVERYDAY AT BEDTIME Patient taking differently: Take 20 mg by mouth every evening.  07/19/19  Yes Dickie La, MD  traMADol (ULTRAM) 50 MG tablet TAKE 1 TABLET BY MOUTH EVERY 12 HOURS AS NEEDED FOR PAIN Patient taking differently: Take 50 mg by mouth every 12 (twelve) hours as needed for moderate pain or severe pain.  05/06/20  Yes Hensel, Jamal Collin, MD  traZODone (DESYREL) 100 MG tablet TAKE 1 TABLET BY MOUTH EVERYDAY AT BEDTIME Patient taking differently: Take 100 mg by mouth at bedtime.  11/24/19  Yes Hensel, Jamal Collin, MD     Allergies:     Allergies  Allergen Reactions  . Atorvastatin Other (See Comments)    Myalgias in legs  . Beta Adrenergic Blockers Other (See Comments)    Severe bradycardia  . Crestor [Rosuvastatin] Other (See Comments)    Myalgias in legs  . Morphine And Related Other (See Comments)    "Crazy thoughts, severe headache, sick"  . Prednisone     On two occasions has caused A fib     Physical Exam:   Vitals  Blood pressure (!) 156/70, pulse (!) 55, temperature 98.6 F (37 C), resp. rate 18, SpO2 97 %.  1.  General: Lying supine in bed in no acute distress  2. Psychiatric: Mood and behavior normal for situation Seems to have good insight to her own healthcare  3. Neurologic: Cranial nerve II through XII are intact, moves all 4 extremities voluntarily, no focal deficit on limited exam  4. HEENMT:  Head is atraumatic, normocephalic, pupils are reactive to light, neck is supple, trachea is midline, mucous membranes are moist  5. Respiratory : Lungs are clear to auscultation bilaterally  6.  Cardiovascular : Heart rate is bradycardic(patient reports this is normal for her when she is not in A. Fib) rhythm is regular, no murmurs rubs or gallops  7. Gastrointestinal:  Abdomen is soft, nondistended, nontender to palpation  8. Skin:  No acute lesions on limited skin exam  9.Musculoskeletal:  No peripheral edema, calf tenderness, acute deformity    Data Review:    CBC Recent Labs  Lab 05/25/20 1919  WBC 4.7  HGB 11.9*  HCT 36.9  PLT 214  MCV 97.6  MCH 31.5  MCHC 32.2  RDW 13.4   ------------------------------------------------------------------------------------------------------------------  Results for orders placed or performed during the hospital encounter of 05/25/20 (from the past 48 hour(s))  Basic metabolic panel     Status: Abnormal   Collection Time: 05/25/20  7:19 PM  Result Value Ref Range   Sodium 136 135 - 145 mmol/L   Potassium 4.1 3.5 - 5.1 mmol/L   Chloride 102 98 - 111 mmol/L   CO2 23 22 - 32 mmol/L   Glucose, Bld 118 (H) 70 - 99 mg/dL    Comment: Glucose reference range applies only to samples taken after fasting for at least 8 hours.   BUN 15 8 - 23 mg/dL   Creatinine,  Ser 1.74 (H) 0.44 - 1.00 mg/dL   Calcium 9.2 8.9 - 10.3 mg/dL   GFR, Estimated 28 (L) >60 mL/min    Comment: (NOTE) Calculated using the CKD-EPI Creatinine Equation (2021)    Anion gap 11 5 - 15    Comment: Performed at [facility] [address], [place], [place] [address]  CBC     Status: Abnormal   Collection Time: 05/25/20  7:19 PM  Result Value Ref Range   WBC 4.7 4.0 - 10.5 K/uL   RBC 3.78 (L) 3.87 - 5.11 MIL/uL   Hemoglobin 11.9 (L) 12.0 - 15.0 g/dL   HCT 36.9 36 - 46 %   MCV 97.6 80.0 - 100.0 fL   MCH 31.5 26.0 - 34.0 pg   MCHC 32.2 30.0 - 36.0 g/dL   RDW 13.4 11.5 - 15.5 %   Platelets 214 150 - 400 K/uL   nRBC 0.0 0.0 - 0.2 %    Comment: Performed at [facility], [place] [address] [place], [place] [address]  Troponin I (High Sensitivity)      Status: None   Collection Time: 05/25/20  7:19 PM  Result Value Ref Range   Troponin I (High Sensitivity) 9 <18 ng/L    Comment: (NOTE) Elevated high sensitivity troponin I (hsTnI) values and significant  changes across serial measurements may suggest ACS but many other  chronic and acute conditions are known to elevate hsTnI results.  Refer to the "Links" section for chest pain algorithms and additional  guidance. Performed at [facility], [place] [address], [place], [place] [address]   Brain natriuretic peptide     Status: Abnormal   Collection Time: 05/25/20  8:22 PM  Result Value Ref Range   B Natriuretic Peptide 284.2 (H) 0.0 - 100.0 pg/mL    Comment: Performed at [facility] [address] [place], [place] [address]  TSH     Status: None   Collection Time: 05/25/20  8:22 PM  Result Value Ref Range   TSH 1.933 0.350 - 4.500 uIU/mL    Comment: Performed by a 3rd Generation assay with a functional sensitivity of <=0.01 uIU/mL. Performed at [facility], [place] [address], [place], [place] [address]   Respiratory Panel by RT PCR (Flu A&B, Covid) - Nasopharyngeal Swab     Status: None   Collection Time: 05/25/20  8:24 PM   Specimen: Nasopharyngeal Swab  Result Value Ref Range   SARS Coronavirus 2 by RT PCR NEGATIVE NEGATIVE    Comment: (NOTE) SARS-CoV-2 target nucleic acids are NOT DETECTED.  The SARS-CoV-2 RNA is generally detectable in upper respiratoy specimens during the acute phase of infection. The lowest concentration of SARS-CoV-2 viral copies this assay can detect is 131 copies/mL. A negative result does not preclude SARS-Cov-2 infection and should not be used as the sole basis for treatment or other patient management decisions. A negative result may occur with  improper specimen collection/handling, submission of specimen other than nasopharyngeal swab, presence of viral mutation(s) within the areas targeted by this assay, and inadequate number of  viral copies (<131 copies/mL). A negative result must be combined with clinical observations, patient history, and epidemiological information. The expected result is Negative.  Fact Sheet for Patients:  PinkCheek.be  Fact Sheet for Healthcare Providers:  GravelBags.it  This test is no t yet approved or cleared by the [place] FDA and  has been authorized for detection and/or diagnosis of SARS-CoV-2  by FDA under an Emergency Use Authorization (EUA). This EUA will remain  in effect (meaning this test can be used) for the duration of the COVID-19 declaration under Section 564(b)(1) of the Act, 21 U.S.C. section 360bbb-3(b)(1), unless the authorization is terminated or revoked sooner.     Influenza A by PCR NEGATIVE NEGATIVE   Influenza B by PCR NEGATIVE NEGATIVE    Comment: (NOTE) The Xpert Xpress SARS-CoV-2/FLU/RSV assay is intended as an aid in  the diagnosis of influenza from Nasopharyngeal swab specimens and  should not be used as a sole basis for treatment. Nasal washings and  aspirates are unacceptable for Xpert Xpress SARS-CoV-2/FLU/RSV  testing.  Fact Sheet for Patients: PinkCheek.be  Fact Sheet for Healthcare Providers: GravelBags.it  This test is not yet approved or cleared by the [place] FDA and  has been authorized for detection and/or diagnosis of SARS-CoV-2 by  FDA under an Emergency Use Authorization (EUA). This EUA will remain  in effect (meaning this test can be used) for the duration of the  Covid-19 declaration under Section 564(b)(1) of the Act, 21  U.S.C. section 360bbb-3(b)(1), unless the authorization is  terminated or revoked. Performed at [facility], [place] [address], [place], [place] [address]   Urinalysis, Routine w reflex microscopic Urine, Clean Catch     Status: Abnormal   Collection Time: 05/25/20  8:46 PM    Result Value Ref Range   Color, Urine YELLOW YELLOW   APPearance HAZY (A) CLEAR   Specific Gravity, Urine 1.013 1.005 - 1.030   pH 7.0 5.0 - 8.0   Glucose, UA NEGATIVE NEGATIVE mg/dL   Hgb urine dipstick NEGATIVE NEGATIVE   Bilirubin Urine NEGATIVE NEGATIVE   Ketones, ur NEGATIVE NEGATIVE mg/dL   Protein, ur NEGATIVE NEGATIVE mg/dL   Nitrite POSITIVE (A) NEGATIVE   Leukocytes,Ua MODERATE (A) NEGATIVE   RBC / HPF 0-5 0 - 5 RBC/hpf   WBC, UA 21-50 0 - 5 WBC/hpf   Bacteria, UA MANY (A) NONE SEEN   Squamous Epithelial / LPF 0-5 0 - 5    Comment: Performed at [facility], [address], [place], [place] [address]  Troponin I (High Sensitivity)     Status: None   Collection Time: 05/25/20 10:15 PM  Result Value Ref Range   Troponin I (High Sensitivity) 10 <18 ng/L    Comment: (NOTE) Elevated high sensitivity troponin I (hsTnI) values and significant  changes across serial measurements may suggest ACS but many other  chronic and acute conditions are known to elevate hsTnI results.  Refer to the "Links" section for chest pain algorithms and additional  guidance. Performed at [facility], [place] [address], [place], [place] [address]     Chemistries  Recent Labs  Lab 05/25/20 1919  NA 136  K 4.1  CL 102  CO2 23  GLUCOSE 118*  BUN 15  CREATININE 1.74*  CALCIUM 9.2   ------------------------------------------------------------------------------------------------------------------  ------------------------------------------------------------------------------------------------------------------ GFR: Estimated Creatinine Clearance: 18.9 mL/min (A) (by C-G formula based on SCr of 1.74 mg/dL (H)). Liver Function Tests: No results for input(s): AST, ALT, ALKPHOS, BILITOT, PROT, ALBUMIN in the last 168 hours. No results for input(s): LIPASE, AMYLASE in the last 168 hours. No results for input(s): AMMONIA in the last 168 hours. Coagulation Profile: No results for  input(s): INR, PROTIME in the last 168 hours. Cardiac Enzymes: No results for input(s): CKTOTAL, CKMB, CKMBINDEX, TROPONINI in the last 168 hours. BNP (last 3 results) No results  for input(s): PROBNP in the last 8760 hours. HbA1C: No results for input(s): HGBA1C in the last 72 hours. CBG: No results for input(s): GLUCAP in the last 168 hours. Lipid Profile: No results for input(s): CHOL, HDL, LDLCALC, TRIG, CHOLHDL, LDLDIRECT in the last 72 hours. Thyroid Function Tests: Recent Labs    05/25/20 2022  TSH 1.933   Anemia Panel: No results for input(s): VITAMINB12, FOLATE, FERRITIN, TIBC, IRON, RETICCTPCT in the last 72 hours.  --------------------------------------------------------------------------------------------------------------- Urine analysis:    Component Value Date/Time   COLORURINE YELLOW 05/25/2020 2046   APPEARANCEUR HAZY (A) 05/25/2020 2046   LABSPEC 1.013 05/25/2020 2046   PHURINE 7.0 05/25/2020 2046   GLUCOSEU NEGATIVE 05/25/2020 2046   HGBUR NEGATIVE 05/25/2020 2046   BILIRUBINUR NEGATIVE 05/25/2020 2046   BILIRUBINUR negative 05/30/2019 1350   BILIRUBINUR NEG 10/30/2015 [place] 05/25/2020 2046   PROTEINUR NEGATIVE 05/25/2020 2046   UROBILINOGEN 0.2 05/30/2019 1350   UROBILINOGEN 0.2 05/06/2015 1605   NITRITE POSITIVE (A) 05/25/2020 2046   LEUKOCYTESUR MODERATE (A) 05/25/2020 2046      Imaging Results:    DG Chest 2 View  Result Date: 05/25/2020 CLINICAL DATA:  Palpitations EXAM: CHEST - 2 VIEW COMPARISON:  09/11/2017 FINDINGS: Prior CABG. Heart is normal size. Possible air-filled cystic area projects over the right hilum. This is new since prior study. This conceivably could be artifactual, external to the patient. Left lung clear. No effusions. IMPRESSION: Thin walled air-filled cystic structure projecting over the right hilum, noted posteriorly on the lateral view. This could reflect a small pneumatocele or cavitary lesion. This  could be further evaluated with chest CT if felt clinically indicated. Electronically Signed   By: Rolm Baptise M.D.   On: 05/25/2020 19:45    My personal review of EKG: Rhythm sinus bradycardia rate 57 /min, QTc 420 ,no Acute ST changes   Assessment & Plan:    Active Problems:   Paroxysmal atrial fibrillation with RVR (HCC)   1. UTI 1. UA shows many bacteria, white blood cells 21-50, positive for nitrites 2. Urine culture pending 3. Rocephin started in the ED 4. Continue Rocephin 5. Likely contributing to weakness 2. Paroxysmal atrial fibrillation 1. A. fib with RVR on presentation with a heart rate of 128 2. Converted spontaneously 3. Likely also contributing to weakness 4. Continue amiodarone and Eliquis 5. Consult cardiology for possible adjustments in her home medications 6. Monitor on telemetry 3. Orthostatic hypotension 1. Gentle hydration 2. Orthostatic vital signs 4. Pneumocele 1. Pneumocele versus cavitary lesion noted on chest x-ray 2. Repeat two-view chest x-ray in the a.m. -if interpretation is consistent may consider CT 5. Coronary artery disease 1. Not on beta-blocker due to an unknown reaction 2. Continue statin 3. Imdur recently discontinued  6. Thyroid disease 1. TSH normal limits 2. Continue levothyroxine   DVT Prophylaxis-   Eliquis- SCDs   AM Labs Ordered, also please review Full Orders  Family Communication:No family at bedside  Code Status:  Full  Admission status: Observation Time spent in minutes : [place] DO

## 2020-05-26 NOTE — ED Notes (Signed)
Tele  Breakfast Ordered 

## 2020-05-26 NOTE — Progress Notes (Signed)
PROGRESS NOTE    Jacqueline [name]  ZOX:[id number] DOB: [birth year] DOA: 05/25/2020 PCP: [name], MD   Chief complaint.  Generalized weakness. Brief Narrative:  Jacqueline [name]  is a 86 y.o. female, with history of insomnia, hypertension, hyperlipidemia, GERD, diverticulitis, coronary artery disease, atrial fibrillation, and more who presents the ED with a chief complaint of generalized weakness. Upon arriving the emergency room, she had a atrial fibrillation and a rapid ventricle response with a heart rate of 128, abnormal urine test.  Chest x-ray did not show any evidence of pneumonia.  Urine culture was sent out, she was started on Rocephin for UTI.  She is also given fluids.   Assessment & Plan:   Active Problems:   Paroxysmal atrial fibrillation with RVR ([place])  #1.  Generalized weakness secondary to UTI. Patient does not meet sepsis criteria.  She is Rocephin. Possible discharge home tomorrow.  2.  Paroxysmal atrial fibrillation with rapid ventricle response. Patient already converted to sinus.  Cardiology consult was obtained by admitting physician. Eliquis and amiodarone.  3.  Orthostatic hypotension. Received fluids.  Volume status improved.  Discontinue fluids now.  4.  Small cyst in the right lower lobe. Repeat a PA lateral chest x-ray did not show any cavitary lesion.  5.  Chronic kidney disease stage IIIb. Stable.   DVT prophylaxis: Eliquis Code Status: Full Family Communication: None  .   Status is: Observation  The patient remains OBS appropriate and will d/c before 2 midnights.  Dispo: The patient is from: Home              Anticipated d/c is to: Home              Anticipated d/c date is: 1 day              Patient currently is not medically stable to d/c.        I/O last 3 completed shifts: In: 100 [IV Piggyback:100] Out: -  No intake/output data recorded.     Consultants:   Cardiology  Procedures:  None  Antimicrobials:Rocephin  Subjective: Patient doing well today.  She was able to walk to the bathroom and back.  No additional dizziness or headache. No fever or chills. Denies any dysuria hematuria. No short of breath or cough.  Objective: Vitals:   05/26/20 0500 05/26/20 0600 05/26/20 0627 05/26/20 0700  BP: (!) 142/63 (!) 162/72 (!) 162/72 (!) 174/76  Pulse: (!) 50 (!) 56 (!) 56 (!) 54  Resp: 17 18 16 16   Temp:      TempSrc:      SpO2: 94% 95% 97% 94%    Intake/Output Summary (Last 24 hours) at 05/26/2020 0838 Last data filed at 05/26/2020 0144 Gross per 24 hour  Intake 100 ml  Output --  Net 100 ml   There were no vitals filed for this visit.  Examination:  General exam: Appears calm and comfortable  Respiratory system: Clear to auscultation. Respiratory effort normal. Cardiovascular system: S1 & S2 heard, RRR. No JVD, murmurs, rubs, gallops or clicks. No pedal edema. Gastrointestinal system: Abdomen is nondistended, soft and nontender. No organomegaly or masses felt. Normal bowel sounds heard. Central nervous system: Alert and oriented x3, No focal neurological deficits. Extremities: Symmetric 5 x 5 power. Skin: No rashes, lesions or ulcers Psychiatry: Mood & affect appropriate.     Data Reviewed: I have personally reviewed following labs and imaging studies  CBC: Recent Labs  Lab 05/25/20 1919 05/26/20 0421  WBC 4.7 3.9*  NEUTROABS  --  1.7  HGB 11.9* 10.6*  HCT 36.9 33.1*  MCV 97.6 98.8  PLT 214 366   Basic Metabolic Panel: Recent Labs  Lab 05/25/20 1919 05/26/20 0421  NA 136 140  K 4.1 4.2  CL 102 107  CO2 23 23  GLUCOSE 118* 87  BUN 15 15  CREATININE 1.74* 1.48*  CALCIUM 9.2 8.9  MG  --  2.0   GFR: Estimated Creatinine Clearance: 22.2 mL/min (A) (by C-G formula based on SCr of 1.48 mg/dL (H)). Liver Function Tests: Recent Labs  Lab 05/26/20 0421  AST 24  ALT 21  ALKPHOS 61  BILITOT 0.4  PROT 5.7*  ALBUMIN 3.3*   No  results for input(s): LIPASE, AMYLASE in the last 168 hours. No results for input(s): AMMONIA in the last 168 hours. Coagulation Profile: No results for input(s): INR, PROTIME in the last 168 hours. Cardiac Enzymes: No results for input(s): CKTOTAL, CKMB, CKMBINDEX, TROPONINI in the last 168 hours. BNP (last 3 results) No results for input(s): PROBNP in the last 8760 hours. HbA1C: No results for input(s): HGBA1C in the last 72 hours. CBG: No results for input(s): GLUCAP in the last 168 hours. Lipid Profile: No results for input(s): CHOL, HDL, LDLCALC, TRIG, CHOLHDL, LDLDIRECT in the last 72 hours. Thyroid Function Tests: Recent Labs    05/25/20 2022  TSH 1.933   Anemia Panel: No results for input(s): VITAMINB12, FOLATE, FERRITIN, TIBC, IRON, RETICCTPCT in the last 72 hours. Sepsis Labs: No results for input(s): PROCALCITON, LATICACIDVEN in the last 168 hours.  Recent Results (from the past 240 hour(s))  Respiratory Panel by RT PCR (Flu A&B, Covid) - Nasopharyngeal Swab     Status: None   Collection Time: 05/25/20  8:24 PM   Specimen: Nasopharyngeal Swab  Result Value Ref Range Status   SARS Coronavirus 2 by RT PCR NEGATIVE NEGATIVE Final    Comment: (NOTE) SARS-CoV-2 target nucleic acids are NOT DETECTED.  The SARS-CoV-2 RNA is generally detectable in upper respiratoy specimens during the acute phase of infection. The lowest concentration of SARS-CoV-2 viral copies this assay can detect is 131 copies/mL. A negative result does not preclude SARS-Cov-2 infection and should not be used as the sole basis for treatment or other patient management decisions. A negative result may occur with  improper specimen collection/handling, submission of specimen other than nasopharyngeal swab, presence of viral mutation(s) within the areas targeted by this assay, and inadequate number of viral copies (<131 copies/mL). A negative result must be combined with clinical observations, patient  history, and epidemiological information. The expected result is Negative.  Fact Sheet for Patients:  PinkCheek.be  Fact Sheet for Healthcare Providers:  GravelBags.it  This test is no t yet approved or cleared by the [place] FDA and  has been authorized for detection and/or diagnosis of SARS-CoV-2 by FDA under an Emergency Use Authorization (EUA). This EUA will remain  in effect (meaning this test can be used) for the duration of the COVID-19 declaration under Section 564(b)(1) of the Act, 21 U.S.C. section 360bbb-3(b)(1), unless the authorization is terminated or revoked sooner.     Influenza A by PCR NEGATIVE NEGATIVE Final   Influenza B by PCR NEGATIVE NEGATIVE Final    Comment: (NOTE) The Xpert Xpress SARS-CoV-2/FLU/RSV assay is intended as an aid in  the diagnosis of influenza from Nasopharyngeal swab specimens and  should not be used as a sole basis for treatment. Nasal washings and  aspirates  are unacceptable for Xpert Xpress SARS-CoV-2/FLU/RSV  testing.  Fact Sheet for Patients: PinkCheek.be  Fact Sheet for Healthcare Providers: GravelBags.it  This test is not yet approved or cleared by the [place] FDA and  has been authorized for detection and/or diagnosis of SARS-CoV-2 by  FDA under an Emergency Use Authorization (EUA). This EUA will remain  in effect (meaning this test can be used) for the duration of the  Covid-19 declaration under Section 564(b)(1) of the Act, 21  U.S.C. section 360bbb-3(b)(1), unless the authorization is  terminated or revoked. Performed at [facility], [address] [place], [place] [address]          Radiology Studies: X-ray chest PA and lateral  Result Date: 05/26/2020 CLINICAL DATA:  Pneumatocele of the lung. EXAM: CHEST - 2 VIEW COMPARISON:  05/25/2020 FINDINGS: Thin walled, 3.7 cm, cyst lies in  the superior segment of the right lower lobe, unchanged, consistent with a pneumatocele. Minor scarring noted at the apices.  Lungs otherwise clear. No pleural effusion or pneumothorax. Stable changes from prior CABG surgery. Cardiac silhouette is normal in size. No mediastinal or hilar masses or evidence of adenopathy. Skeletal structures are demineralized but grossly intact. IMPRESSION: 1. No active cardiopulmonary disease. 2. No change from the prior study. Thin walled air cyst in the right lower lobe superior segment, stable. Electronically Signed   By: Lajean Manes M.D.   On: 05/26/2020 06:33   DG Chest 2 View  Result Date: 05/25/2020 CLINICAL DATA:  Palpitations EXAM: CHEST - 2 VIEW COMPARISON:  09/11/2017 FINDINGS: Prior CABG. Heart is normal size. Possible air-filled cystic area projects over the right hilum. This is new since prior study. This conceivably could be artifactual, external to the patient. Left lung clear. No effusions. IMPRESSION: Thin walled air-filled cystic structure projecting over the right hilum, noted posteriorly on the lateral view. This could reflect a small pneumatocele or cavitary lesion. This could be further evaluated with chest CT if felt clinically indicated. Electronically Signed   By: Rolm Baptise M.D.   On: 05/25/2020 19:45        Scheduled Meds: . amiodarone  200 mg Oral Daily  . apixaban  2.5 mg Oral BID  . cholecalciferol  1,000 Units Oral Daily  . gabapentin  100 mg Oral TID  . levothyroxine  75 mcg Oral Daily  . multivitamin with minerals  1 tablet Oral Daily  . pantoprazole  40 mg Oral Daily  . rOPINIRole  0.25 mg Oral TID  . simvastatin  20 mg Oral q1800   Continuous Infusions: . cefTRIAXone (ROCEPHIN)  IV       LOS: 0 days    Time spent:     Sharen Hones, MD [facility]   To contact the attending provider between 7A-7P or the covering provider during after hours 7P-7A, please log into the web site [email or web address] and access  using universal  password for that web site. If you do not have the password, please call the hospital operator.  05/26/2020, 8:38 AM

## 2020-05-27 ENCOUNTER — Telehealth: Payer: Self-pay | Admitting: Cardiovascular Disease

## 2020-05-27 MED ORDER — AMIODARONE HCL 200 MG PO TABS: 200.0000 mg | ORAL_TABLET | Freq: Two times a day (BID) | ORAL | 0 refills | Status: DC

## 2020-05-27 NOTE — Progress Notes (Signed)
Primary Cardiologist:  Croitoru  Subjective:  Denies SSCP, palpitations or Dyspnea Feels better   Objective:  Vitals:   05/26/20 2155 05/27/20 0022 05/27/20 0338 05/27/20 0438  BP: (!) 196/80 (!) 143/54 136/65 (!) 127/47  Pulse:  (!) 55  64  Resp:  18  20  Temp:  98 F (36.7 C)  97.9 F (36.6 C)  TempSrc:  Oral  Oral  SpO2:    94%    Intake/Output from previous day:  Intake/Output Summary (Last 24 hours) at 05/27/2020 0845 Last data filed at 05/27/2020 0815 Gross per 24 hour  Intake 813.62 ml  Output 1 ml  Net 812.62 ml    Physical Exam: Affect appropriate Healthy:  appears stated age HEENT: normal Neck supple with no adenopathy JVP normal no bruits no thyromegaly Lungs clear with no wheezing and good diaphragmatic motion Heart:  S1/S2 no murmur, no rub, gallop or click PMI normal Abdomen: benighn, BS positve, no tenderness, no AAA no bruit.  No HSM or HJR Distal pulses intact with no bruits No edema Neuro non-focal Skin warm and dry No muscular weakness   Lab Results: Basic Metabolic Panel: Recent Labs    05/25/20 1919 05/26/20 0421  NA 136 140  K 4.1 4.2  CL 102 107  CO2 23 23  GLUCOSE 118* 87  BUN 15 15  CREATININE 1.74* 1.48*  CALCIUM 9.2 8.9  MG  --  2.0   Liver Function Tests: Recent Labs    05/26/20 0421  AST 24  ALT 21  ALKPHOS 61  BILITOT 0.4  PROT 5.7*  ALBUMIN 3.3*   No results for input(s): LIPASE, AMYLASE in the last 72 hours. CBC: Recent Labs    05/25/20 1919 05/26/20 0421  WBC 4.7 3.9*  NEUTROABS  --  1.7  HGB 11.9* 10.6*  HCT 36.9 33.1*  MCV 97.6 98.8  PLT 214 172   Thyroid Function Tests: Recent Labs    05/25/20 2022  TSH 1.933   Anemia Panel: No results for input(s): VITAMINB12, FOLATE, FERRITIN, TIBC, IRON, RETICCTPCT in the last 72 hours.  Imaging: X-ray chest PA and lateral  Result Date: 05/26/2020 CLINICAL DATA:  Pneumatocele of the lung. EXAM: CHEST - 2 VIEW COMPARISON:  05/25/2020 FINDINGS:  Thin walled, 3.7 cm, cyst lies in the superior segment of the right lower lobe, unchanged, consistent with a pneumatocele. Minor scarring noted at the apices.  Lungs otherwise clear. No pleural effusion or pneumothorax. Stable changes from prior CABG surgery. Cardiac silhouette is normal in size. No mediastinal or hilar masses or evidence of adenopathy. Skeletal structures are demineralized but grossly intact. IMPRESSION: 1. No active cardiopulmonary disease. 2. No change from the prior study. Thin walled air cyst in the right lower lobe superior segment, stable. Electronically Signed   By: Lajean Manes M.D.   On: 05/26/2020 06:33   DG Chest 2 View  Result Date: 05/25/2020 CLINICAL DATA:  Palpitations EXAM: CHEST - 2 VIEW COMPARISON:  09/11/2017 FINDINGS: Prior CABG. Heart is normal size. Possible air-filled cystic area projects over the right hilum. This is new since prior study. This conceivably could be artifactual, external to the patient. Left lung clear. No effusions. IMPRESSION: Thin walled air-filled cystic structure projecting over the right hilum, noted posteriorly on the lateral view. This could reflect a small pneumatocele or cavitary lesion. This could be further evaluated with chest CT if felt clinically indicated. Electronically Signed   By: Rolm Baptise M.D.   On: 05/25/2020 19:45  Cardiac Studies:  ECG: SR rate 57 normal    Telemetry:  NSR 05/27/2020   Echo: EF 55-60% moderate LAE mild MR 04/23/20   Medications:   . amiodarone  200 mg Oral BID  . apixaban  2.5 mg Oral BID  . cholecalciferol  1,000 Units Oral Daily  . gabapentin  100 mg Oral TID  . levothyroxine  75 mcg Oral Daily  . multivitamin with minerals  1 tablet Oral Daily  . pantoprazole  40 mg Oral Daily  . rOPINIRole  0.25 mg Oral TID  . simvastatin  20 mg Oral q1800     . cefTRIAXone (ROCEPHIN)  IV 1 g (05/26/20 2148)    Assessment/Plan:   1. PAF:  Back in NSR ? Precipitated by UTI continue low dose  eliquis and amiodarone   2. UTI:  No burning or frequency on Rocephin   3. Thyroid:  Continue replacement TSH normal   4. HLD  On statin   [name] 05/27/2020, 8:45 AM

## 2020-05-27 NOTE — Discharge Summary (Signed)
Physician Discharge Summary  Jacqueline [name] AQT:[id number] DOB: [birth year] DOA: 05/25/2020  PCP: [name], MD  Admit date: 05/25/2020 Discharge date: 05/27/2020  Admitted From: Home  Disposition:  Home   Recommendations for Outpatient Follow-up:  1. Follow up with PCP Dr. Andria Frames in 1 week  Home Health: None  Equipment/Devices: None  Discharge Condition: Good  CODE STATUS: FULL Diet recommendation: Cardiac  Brief/Interim Summary: Jacqueline [name] is an 86 y.o. F with hx pAF on AC, HTN, CKD IV and CAD who presented with weakness and fall.    In the ER, noted to be in rapid Afib.          PRINCIPAL HOSPITAL DIAGNOSIS: Symptomatic atrial fibrillation    Discharge Diagnoses:  Symptomatic atrial fibrillation Paroxysmal atrial fibrillation Patient had recently had several falls, seen in Cardiology clinic with falls and found to be orthostatic.    Prior to admission, she was feeling malaise, generalized weakness and fatigue and thought she was in Afib.  Was instructed to come to the ER, where EKG showed HR 130.  Her amiodarone was increased and she returned to sinus rhythm and stayed there.  -Double amiodarone dose for 2 weeks -Close follow up with PCP to monitor for bradycardia and close follow up with Cardiology for amiodarone titration   Chronic kidney disease stage IV Stable relative to baseline  Asymptomatic bacteriuria Treated with ceftriaxone for 2 days.  Urine culture positive, but no symptoms of UTI, no dysuria, no urinary urgency.  No fever, suprapubic pain, urinary irritation.  Hypertension Coronary artery disease  Pneumatocele No clinical symptoms at present. I do not feel further work up is necessary at this time, unless the patient were to develop new respiratory symptoms.  Insomnia Given recent falls, discouraged patient from Xanax use.      Discharge Instructions  Discharge Instructions    Diet - low sodium heart healthy   Complete by:  As directed    Discharge instructions   Complete by: As directed    From Dr. Loleta Books: You were admitted for weakness and Afib For the Afib, Dr. Donley Redder partners increased your amiodarone for a few weeks:     Take amiodarone 200 mg TWICE daily then resume 200 mg once daily     Call Dr. Donley Redder office for a follow up **within 2 weeks** and ask them to confirm your amiodarone dose going forward   No more antibiotics for UTI  Resume your other home medicines  Call your primary care doctor for a follow up appointment next week  Avoid Xanax, as this is VERY associated with falls and injury in 4 year olds   Increase activity slowly   Complete by: As directed      Allergies as of 05/27/2020      Reactions   Atorvastatin Other (See Comments)   Myalgias in legs   Beta Adrenergic Blockers Other (See Comments)   Severe bradycardia   Crestor [rosuvastatin] Other (See Comments)   Myalgias in legs   Morphine And Related Other (See Comments)   "Crazy thoughts, severe headache, sick"   Prednisone    On two occasions has caused A fib      Medication List    TAKE these medications   acetaminophen 500 MG tablet Commonly known as: TYLENOL Take 500 mg by mouth every 6 (six) hours as needed for moderate pain (pain/headache.).   ALPRAZolam 0.5 MG tablet Commonly known as: XANAX TAKE 1 TABLET BY MOUTH TWICE A DAY AS NEEDED FOR  ANXIETY What changed: See the new instructions.   amiodarone 200 MG tablet Commonly known as: PACERONE Take 1 tablet (200 mg total) by mouth 2 (two) times daily for 14 days.   apixaban 2.5 MG Tabs tablet Commonly known as: Eliquis Take 1 tablet (2.5 mg total) by mouth 2 (two) times daily.   cholecalciferol 25 MCG (1000 UNIT) tablet Commonly known as: VITAMIN D3 Take 1,000 Units by mouth daily.   cycloSPORINE 0.05 % ophthalmic emulsion Commonly known as: RESTASIS Place 1 drop into both eyes 2 (two) times daily.   Fish Oil 1000 MG Caps Take 1,000  mg by mouth 2 (two) times a day.   gabapentin 100 MG capsule Commonly known as: NEURONTIN TAKE 1 CAPSULE (100 MG TOTAL) BY MOUTH 3 (THREE) TIMES DAILY.   hydrALAZINE 25 MG tablet Commonly known as: APRESOLINE TAKE 1 TABLET BY MOUTH EVERY DAY FOR SBP ABOVE 160 AS NEEDED UP TO 2 TABS DAILY   levothyroxine 75 MCG tablet Commonly known as: SYNTHROID TAKE 1 TABLET BY MOUTH EVERY DAY   Lubricant Eye Drops 0.4-0.3 % Soln Generic drug: Polyethyl Glycol-Propyl Glycol Place 1-2 drops into both eyes 3 (three) times daily as needed (dry/irritated eyes.).   meclizine 12.5 MG tablet Commonly known as: ANTIVERT Take 1 tablet (12.5 mg total) by mouth 3 (three) times daily as needed for dizziness.   multivitamin with minerals Tabs tablet Take 1 tablet by mouth daily.   nitroGLYCERIN 0.4 MG SL tablet Commonly known as: NITROSTAT PLACE 1 TABLET UNDER THE TONGUE EVERY 5 MINUTES AS NEEDED FOR CHEST PAIN UP TO 3 DOSES What changed:   how much to take  how to take this  when to take this  reasons to take this   omeprazole 20 MG capsule Commonly known as: PRILOSEC TAKE 1 CAPSULE BY MOUTH EVERY DAY   polyethylene glycol powder 17 GM/SCOOP powder Commonly known as: [employer] Purelax MIX 17 G AND TAKE BY MOUTH DAILY. What changed:   how much to take  how to take this  when to take this  additional instructions   rOPINIRole 0.25 MG tablet Commonly known as: REQUIP TAKE 1 TABLET BY MOUTH THREE TIMES A DAY   simvastatin 20 MG tablet Commonly known as: ZOCOR TAKE 1 TABLET BY MOUTH EVERYDAY AT BEDTIME What changed: See the new instructions.   traMADol 50 MG tablet Commonly known as: ULTRAM TAKE 1 TABLET BY MOUTH EVERY 12 HOURS AS NEEDED FOR PAIN What changed:   reasons to take this  additional instructions   traZODone 100 MG tablet Commonly known as: DESYREL TAKE 1 TABLET BY MOUTH EVERYDAY AT BEDTIME What changed: See the new instructions.       Allergies  Allergen  Reactions  . Atorvastatin Other (See Comments)    Myalgias in legs  . Beta Adrenergic Blockers Other (See Comments)    Severe bradycardia  . Crestor [Rosuvastatin] Other (See Comments)    Myalgias in legs  . Morphine And Related Other (See Comments)    "Crazy thoughts, severe headache, sick"  . Prednisone     On two occasions has caused A fib    Consultations:  Cardiology   Procedures/Studies: X-ray chest PA and lateral  Result Date: 05/26/2020 CLINICAL DATA:  Pneumatocele of the lung. EXAM: CHEST - 2 VIEW COMPARISON:  05/25/2020 FINDINGS: Thin walled, 3.7 cm, cyst lies in the superior segment of the right lower lobe, unchanged, consistent with a pneumatocele. Minor scarring noted at the apices.  Lungs otherwise clear.  No pleural effusion or pneumothorax. Stable changes from prior CABG surgery. Cardiac silhouette is normal in size. No mediastinal or hilar masses or evidence of adenopathy. Skeletal structures are demineralized but grossly intact. IMPRESSION: 1. No active cardiopulmonary disease. 2. No change from the prior study. Thin walled air cyst in the right lower lobe superior segment, stable. Electronically Signed   By: Lajean Manes M.D.   On: 05/26/2020 06:33   DG Chest 2 View  Result Date: 05/25/2020 CLINICAL DATA:  Palpitations EXAM: CHEST - 2 VIEW COMPARISON:  09/11/2017 FINDINGS: Prior CABG. Heart is normal size. Possible air-filled cystic area projects over the right hilum. This is new since prior study. This conceivably could be artifactual, external to the patient. Left lung clear. No effusions. IMPRESSION: Thin walled air-filled cystic structure projecting over the right hilum, noted posteriorly on the lateral view. This could reflect a small pneumatocele or cavitary lesion. This could be further evaluated with chest CT if felt clinically indicated. Electronically Signed   By: Rolm Baptise M.D.   On: 05/25/2020 19:45       Subjective: Patient without urinary  irritative symptoms.  No cough, sputum or fever.  No confusion.  Feels well.  Fatigue normal.  Discharge Exam: Vitals:   05/27/20 0338 05/27/20 0438  BP: 136/65 (!) 127/47  Pulse:  64  Resp:  20  Temp:  97.9 F (36.6 C)  SpO2:  94%   Vitals:   05/26/20 2155 05/27/20 0022 05/27/20 0338 05/27/20 0438  BP: (!) 196/80 (!) 143/54 136/65 (!) 127/47  Pulse:  (!) 55  64  Resp:  18  20  Temp:  98 F (36.7 C)  97.9 F (36.6 C)  TempSrc:  Oral  Oral  SpO2:    94%    General: Pt is alert, awake, not in acute distress Cardiovascular: RRR, nl S1-S2, no murmurs appreciated.   No LE edema.   Respiratory: Normal respiratory rate and rhythm.  CTAB without rales or wheezes. Abdominal: Abdomen soft and non-tender.  No distension or HSM.   Neuro/Psych: Strength symmetric in upper and lower extremities.  Judgment and insight appear normal.   The results of significant diagnostics from this hospitalization (including imaging, microbiology, ancillary and laboratory) are listed below for reference.     Microbiology: Recent Results (from the past 240 hour(s))  Respiratory Panel by RT PCR (Flu A&B, Covid) - Nasopharyngeal Swab     Status: None   Collection Time: 05/25/20  8:24 PM   Specimen: Nasopharyngeal Swab  Result Value Ref Range Status   SARS Coronavirus 2 by RT PCR NEGATIVE NEGATIVE Final    Comment: (NOTE) SARS-CoV-2 target nucleic acids are NOT DETECTED.  The SARS-CoV-2 RNA is generally detectable in upper respiratoy specimens during the acute phase of infection. The lowest concentration of SARS-CoV-2 viral copies this assay can detect is 131 copies/mL. A negative result does not preclude SARS-Cov-2 infection and should not be used as the sole basis for treatment or other patient management decisions. A negative result may occur with  improper specimen collection/handling, submission of specimen other than nasopharyngeal swab, presence of viral mutation(s) within the areas targeted  by this assay, and inadequate number of viral copies (<131 copies/mL). A negative result must be combined with clinical observations, patient history, and epidemiological information. The expected result is Negative.  Fact Sheet for Patients:  PinkCheek.be  Fact Sheet for Healthcare Providers:  GravelBags.it  This test is no t yet approved or cleared by the [place]  FDA and  has been authorized for detection and/or diagnosis of SARS-CoV-2 by FDA under an Emergency Use Authorization (EUA). This EUA will remain  in effect (meaning this test can be used) for the duration of the COVID-19 declaration under Section 564(b)(1) of the Act, 21 U.S.C. section 360bbb-3(b)(1), unless the authorization is terminated or revoked sooner.     Influenza A by PCR NEGATIVE NEGATIVE Final   Influenza B by PCR NEGATIVE NEGATIVE Final    Comment: (NOTE) The Xpert Xpress SARS-CoV-2/FLU/RSV assay is intended as an aid in  the diagnosis of influenza from Nasopharyngeal swab specimens and  should not be used as a sole basis for treatment. Nasal washings and  aspirates are unacceptable for Xpert Xpress SARS-CoV-2/FLU/RSV  testing.  Fact Sheet for Patients: PinkCheek.be  Fact Sheet for Healthcare Providers: GravelBags.it  This test is not yet approved or cleared by the [place] FDA and  has been authorized for detection and/or diagnosis of SARS-CoV-2 by  FDA under an Emergency Use Authorization (EUA). This EUA will remain  in effect (meaning this test can be used) for the duration of the  Covid-19 declaration under Section 564(b)(1) of the Act, 21  U.S.C. section 360bbb-3(b)(1), unless the authorization is  terminated or revoked. Performed at [facility], [place] [address] [place], [place] [address]   Urine culture     Status: Abnormal (Preliminary result)   Collection  Time: 05/25/20  8:48 PM   Specimen: Urine, Random  Result Value Ref Range Status   Specimen Description URINE, RANDOM  Final   Special Requests NONE  Final   Culture (A)  Final    >=100,000 COLONIES/mL GRAM NEGATIVE RODS SUSCEPTIBILITIES TO FOLLOW Performed at [facility], [place] [address], [place], [place] [address]    Report Status PENDING  Incomplete     Labs: BNP (last 3 results) Recent Labs    05/25/20 2022  BNP 532.9*   Basic Metabolic Panel: Recent Labs  Lab 05/25/20 1919 05/26/20 0421  NA 136 140  K 4.1 4.2  CL 102 107  CO2 23 23  GLUCOSE 118* 87  BUN 15 15  CREATININE 1.74* 1.48*  CALCIUM 9.2 8.9  MG  --  2.0   Liver Function Tests: Recent Labs  Lab 05/26/20 0421  AST 24  ALT 21  ALKPHOS 61  BILITOT 0.4  PROT 5.7*  ALBUMIN 3.3*   No results for input(s): LIPASE, AMYLASE in the last 168 hours. No results for input(s): AMMONIA in the last 168 hours. CBC: Recent Labs  Lab 05/25/20 1919 05/26/20 0421  WBC 4.7 3.9*  NEUTROABS  --  1.7  HGB 11.9* 10.6*  HCT 36.9 33.1*  MCV 97.6 98.8  PLT 214 172   Cardiac Enzymes: No results for input(s): CKTOTAL, CKMB, CKMBINDEX, TROPONINI in the last 168 hours. BNP: Invalid input(s): POCBNP CBG: No results for input(s): GLUCAP in the last 168 hours. D-Dimer No results for input(s): DDIMER in the last 72 hours. Hgb A1c No results for input(s): HGBA1C in the last 72 hours. Lipid Profile No results for input(s): CHOL, HDL, LDLCALC, TRIG, CHOLHDL, LDLDIRECT in the last 72 hours. Thyroid function studies Recent Labs    05/25/20 2022  TSH 1.933   Anemia work up No results for input(s): VITAMINB12, FOLATE, FERRITIN, TIBC, IRON, RETICCTPCT in the last 72 hours. Urinalysis    Component Value Date/Time   COLORURINE YELLOW 05/25/2020 2046   APPEARANCEUR HAZY (A) 05/25/2020 2046   LABSPEC 1.013 05/25/2020  2046   PHURINE 7.0 05/25/2020 2046   GLUCOSEU NEGATIVE 05/25/2020 2046   HGBUR NEGATIVE  05/25/2020 2046   BILIRUBINUR NEGATIVE 05/25/2020 2046   BILIRUBINUR negative 05/30/2019 1350   BILIRUBINUR NEG 10/30/2015 [place] 05/25/2020 2046   PROTEINUR NEGATIVE 05/25/2020 2046   UROBILINOGEN 0.2 05/30/2019 1350   UROBILINOGEN 0.2 05/06/2015 1605   NITRITE POSITIVE (A) 05/25/2020 2046   LEUKOCYTESUR MODERATE (A) 05/25/2020 2046   Sepsis Labs Invalid input(s): PROCALCITONIN,  WBC,  LACTICIDVEN Microbiology Recent Results (from the past 240 hour(s))  Respiratory Panel by RT PCR (Flu A&B, Covid) - Nasopharyngeal Swab     Status: None   Collection Time: 05/25/20  8:24 PM   Specimen: Nasopharyngeal Swab  Result Value Ref Range Status   SARS Coronavirus 2 by RT PCR NEGATIVE NEGATIVE Final    Comment: (NOTE) SARS-CoV-2 target nucleic acids are NOT DETECTED.  The SARS-CoV-2 RNA is generally detectable in upper respiratoy specimens during the acute phase of infection. The lowest concentration of SARS-CoV-2 viral copies this assay can detect is 131 copies/mL. A negative result does not preclude SARS-Cov-2 infection and should not be used as the sole basis for treatment or other patient management decisions. A negative result may occur with  improper specimen collection/handling, submission of specimen other than nasopharyngeal swab, presence of viral mutation(s) within the areas targeted by this assay, and inadequate number of viral copies (<131 copies/mL). A negative result must be combined with clinical observations, patient history, and epidemiological information. The expected result is Negative.  Fact Sheet for Patients:  PinkCheek.be  Fact Sheet for Healthcare Providers:  GravelBags.it  This test is no t yet approved or cleared by the [place] FDA and  has been authorized for detection and/or diagnosis of SARS-CoV-2 by FDA under an Emergency Use Authorization (EUA). This EUA will remain  in  effect (meaning this test can be used) for the duration of the COVID-19 declaration under Section 564(b)(1) of the Act, 21 U.S.C. section 360bbb-3(b)(1), unless the authorization is terminated or revoked sooner.     Influenza A by PCR NEGATIVE NEGATIVE Final   Influenza B by PCR NEGATIVE NEGATIVE Final    Comment: (NOTE) The Xpert Xpress SARS-CoV-2/FLU/RSV assay is intended as an aid in  the diagnosis of influenza from Nasopharyngeal swab specimens and  should not be used as a sole basis for treatment. Nasal washings and  aspirates are unacceptable for Xpert Xpress SARS-CoV-2/FLU/RSV  testing.  Fact Sheet for Patients: PinkCheek.be  Fact Sheet for Healthcare Providers: GravelBags.it  This test is not yet approved or cleared by the [place] FDA and  has been authorized for detection and/or diagnosis of SARS-CoV-2 by  FDA under an Emergency Use Authorization (EUA). This EUA will remain  in effect (meaning this test can be used) for the duration of the  Covid-19 declaration under Section 564(b)(1) of the Act, 21  U.S.C. section 360bbb-3(b)(1), unless the authorization is  terminated or revoked. Performed at [facility], [place] [address] [place], [place] [address]   Urine culture     Status: Abnormal (Preliminary result)   Collection Time: 05/25/20  8:48 PM   Specimen: Urine, Random  Result Value Ref Range Status   Specimen Description URINE, RANDOM  Final   Special Requests NONE  Final   Culture (A)  Final    >=100,000 COLONIES/mL GRAM NEGATIVE RODS SUSCEPTIBILITIES TO FOLLOW Performed at [facility], [place] [address], [place], [place] [address]  Report Status PENDING  Incomplete     Time coordinating discharge: 35 minutes      SIGNED:   Edwin Dada, MD  [facility] 05/27/2020, 5:29 PM

## 2020-05-27 NOTE — Telephone Encounter (Signed)
Currently admitted.

## 2020-05-27 NOTE — Progress Notes (Signed)
Pt received discharge instructions and does not have any additional questions or concerns. Vss are stable. Pt reports she feels well and ready for discharge.

## 2020-05-27 NOTE — Telephone Encounter (Signed)
Patient has TOC appointment with Coletta Memos 06/18/2020 at 11:15 am per staff message from Cecilie Kicks.

## 2020-05-28 LAB — URINE CULTURE: Culture: >=100000 — AB

## 2020-05-29 NOTE — Telephone Encounter (Addendum)
Patient contacted regarding discharge from [facility] on 05-27-20.  Patient understands to follow up with provider Coletta Memos NP on 06-18-20 at 11:15 at [facility]. Patient understands discharge instructions? YES Patient understands medications and regiment? YES Patient understands to bring all medications to this visit? YES  PER PT NOTES WEAKNESS  B/P 112/55 HR 69 THIS AM

## 2020-06-05 ENCOUNTER — Other Ambulatory Visit: Payer: Self-pay | Admitting: Family Medicine

## 2020-06-05 DIAGNOSIS — I700 Atherosclerosis of aorta: Secondary | ICD-10-CM | POA: Diagnosis not present

## 2020-06-05 DIAGNOSIS — I25118 Atherosclerotic heart disease of native coronary artery with other forms of angina pectoris: Secondary | ICD-10-CM | POA: Diagnosis not present

## 2020-06-05 DIAGNOSIS — Z7901 Long term (current) use of anticoagulants: Secondary | ICD-10-CM | POA: Diagnosis not present

## 2020-06-05 DIAGNOSIS — I10 Essential (primary) hypertension: Secondary | ICD-10-CM

## 2020-06-05 DIAGNOSIS — I509 Heart failure, unspecified: Secondary | ICD-10-CM | POA: Diagnosis not present

## 2020-06-05 DIAGNOSIS — D696 Thrombocytopenia, unspecified: Secondary | ICD-10-CM | POA: Diagnosis not present

## 2020-06-05 DIAGNOSIS — D6869 Other thrombophilia: Secondary | ICD-10-CM | POA: Diagnosis not present

## 2020-06-05 DIAGNOSIS — I480 Paroxysmal atrial fibrillation: Secondary | ICD-10-CM | POA: Diagnosis not present

## 2020-06-10 ENCOUNTER — Ambulatory Visit (INDEPENDENT_AMBULATORY_CARE_PROVIDER_SITE_OTHER): Payer: PPO | Admitting: Family Medicine

## 2020-06-10 ENCOUNTER — Other Ambulatory Visit: Payer: Self-pay

## 2020-06-10 ENCOUNTER — Encounter: Payer: Self-pay | Admitting: Family Medicine

## 2020-06-10 DIAGNOSIS — R296 Repeated falls: Secondary | ICD-10-CM | POA: Diagnosis not present

## 2020-06-10 DIAGNOSIS — I480 Paroxysmal atrial fibrillation: Secondary | ICD-10-CM | POA: Diagnosis not present

## 2020-06-10 DIAGNOSIS — F4329 Adjustment disorder with other symptoms: Secondary | ICD-10-CM | POA: Diagnosis not present

## 2020-06-10 DIAGNOSIS — N1832 Chronic kidney disease, stage 3b: Secondary | ICD-10-CM | POA: Diagnosis not present

## 2020-06-10 DIAGNOSIS — R531 Weakness: Secondary | ICD-10-CM | POA: Diagnosis not present

## 2020-06-10 DIAGNOSIS — Z7901 Long term (current) use of anticoagulants: Secondary | ICD-10-CM | POA: Diagnosis not present

## 2020-06-10 DIAGNOSIS — D500 Iron deficiency anemia secondary to blood loss (chronic): Secondary | ICD-10-CM

## 2020-06-10 DIAGNOSIS — N184 Chronic kidney disease, stage 4 (severe): Secondary | ICD-10-CM

## 2020-06-10 DIAGNOSIS — I10 Essential (primary) hypertension: Secondary | ICD-10-CM

## 2020-06-10 LAB — POCT UA - MICROSCOPIC ONLY

## 2020-06-10 LAB — POCT URINALYSIS DIP (MANUAL ENTRY)
Bilirubin, UA: NEGATIVE
Blood, UA: NEGATIVE
Glucose, UA: NEGATIVE mg/dL
Ketones, POC UA: NEGATIVE mg/dL
Nitrite, UA: NEGATIVE
Protein Ur, POC: NEGATIVE mg/dL
Spec Grav, UA: 1.02 (ref 1.010–1.025)
Urobilinogen, UA: 0.2 E.U./dL
pH, UA: 6 (ref 5.0–8.0)

## 2020-06-10 MED ORDER — BUSPIRONE HCL 5 MG PO TABS: 5.0000 mg | ORAL_TABLET | Freq: Two times a day (BID) | ORAL | 3 refills | Status: DC

## 2020-06-10 NOTE — Assessment & Plan Note (Signed)
No change based on good home BP reads.

## 2020-06-10 NOTE — Assessment & Plan Note (Addendum)
Amiodorone adjustments per cards.  She is in regular rate and rhythm by my exam.  Based on notes from cards, I told to decrease amiodorone to daily when I called with her lab results.

## 2020-06-10 NOTE — Patient Instructions (Signed)
I will call tomorrow with test results Someone should call to set up home PT. I hope that I can find something to change.  Right now, I am not seeing anything about your meds to change.  The path to feeling better I think is to eat more and work with physical therapy to get stronger.   I sent in a new pill to help with anxiety.  I hope it works better than the xanax.

## 2020-06-10 NOTE — Assessment & Plan Note (Signed)
On eliquis.  Check CBC

## 2020-06-10 NOTE — Assessment & Plan Note (Addendum)
States alprazolam not working.  Good excuse to stop it.  Trial of buspar.  Watch renal function may need to adjust dose.  Called with FU labs.  Having diarrhea and did not sleep with buspar.  Will switch back to alprazolam.

## 2020-06-10 NOTE — Progress Notes (Signed)
    SUBJECTIVE:   CHIEF COMPLAINT / HPI:   FU hospitalization of A fib with RVR.  Converted.   Now on amiodorone and eliquis.  Tolerating meds well.  No bleeding or other side effects.   Has FU with cards planned for amiodorone titration.    Poor appetite continues.  Weight has stabilized at a lower level.  She forces herself to eat.  Up to date on cancer screening.   Labile hypertension.  Home BPs have been good.    Frequent falls.  Has fallen x 3 recently.  Not while in Afib with RVR.  Has had home nurse but not home PT.  Cannot drive herself to office visits.  CKD3b-4  Last creat was improved.  She is rarely taking lasix.  Told by cards to only use prn for her diastolic CHF.    Diastolic CHF.  No edema or orthopnea.  She does not complain of DOE.  She states she "just gives out with minimal activity."    OBJECTIVE:   BP (!) 164/70   Pulse (!) 57   Ht 5\' 1"  (1.549 m)   Wt 131 lb 3.2 oz (59.5 kg)   SpO2 97%   BMI 24.79 kg/m   Elevated BP here but good home BP readings. Lungs clear Cardiac RRR without m or g Ext trace bilateral edema.  ASSESSMENT/PLAN:   Weakness generalized I do not see an important medical cause.  Likely multifactorial with poor nutrition, deconditioning and multiple chronic medical problems.  No med adjustments.  Will get PT to see.  Try to increase caloric intake.  Paroxysmal atrial fibrillation (HCC) Amiodorone adjustments per cards.  She is in regular rate and rhythm by my exam.  Hypertension No change based on good home BP reads.  CKD stage G3b/A1, GFR 30-44 and albumin creatinine ratio <30 mg/g (HCC) Recheck BMP.    Anemia due to chronic blood loss On eliquis.  Check CBC  Adjustment reaction with physical symptoms States alprazolam not working.  Good excuse to stop it.  Trial of buspar.  Watch renal function may need to adjust dose.  Frequent falls Home PT ordered.  Falls especially important since she is on a blood thinner.      [name], MD [place]

## 2020-06-10 NOTE — Assessment & Plan Note (Signed)
Recheck BMP.

## 2020-06-10 NOTE — Assessment & Plan Note (Signed)
Home PT ordered.  Falls especially important since she is on a blood thinner.

## 2020-06-10 NOTE — Assessment & Plan Note (Signed)
I do not see an important medical cause.  Likely multifactorial with poor nutrition, deconditioning and multiple chronic medical problems.  No med adjustments.  Will get PT to see.  Try to increase caloric intake.

## 2020-06-11 ENCOUNTER — Telehealth: Payer: Self-pay | Admitting: *Deleted

## 2020-06-11 DIAGNOSIS — I480 Paroxysmal atrial fibrillation: Secondary | ICD-10-CM

## 2020-06-11 LAB — CBC
Hematocrit: 35.4 % (ref 34.0–46.6)
Hemoglobin: 11.8 g/dL (ref 11.1–15.9)
MCH: 32.6 pg (ref 26.6–33.0)
MCHC: 33.3 g/dL (ref 31.5–35.7)
MCV: 98 fL — ABNORMAL HIGH (ref 79–97)
Platelets: 164 10*3/uL (ref 150–450)
RBC: 3.62 x10E6/uL — ABNORMAL LOW (ref 3.77–5.28)
RDW: 12.5 % (ref 11.7–15.4)
WBC: 4.2 10*3/uL (ref 3.4–10.8)

## 2020-06-11 LAB — BASIC METABOLIC PANEL
BUN/Creatinine Ratio: 10 — ABNORMAL LOW (ref 12–28)
BUN: 16 mg/dL (ref 8–27)
CO2: 22 mmol/L (ref 20–29)
Calcium: 9.4 mg/dL (ref 8.7–10.3)
Chloride: 106 mmol/L (ref 96–106)
Creatinine, Ser: 1.59 mg/dL — ABNORMAL HIGH (ref 0.57–1.00)
GFR calc Af Amer: 33 mL/min/{1.73_m2} — ABNORMAL LOW (ref >59)
GFR calc non Af Amer: 29 mL/min/{1.73_m2} — ABNORMAL LOW (ref >59)
Glucose: 79 mg/dL (ref 65–99)
Potassium: 4.7 mmol/L (ref 3.5–5.2)
Sodium: 142 mmol/L (ref 134–144)

## 2020-06-11 MED ORDER — ALPRAZOLAM 0.5 MG PO TBDP: 0.5000 mg | ORAL_TABLET | Freq: Two times a day (BID) | ORAL | 5 refills | Status: DC | PRN

## 2020-06-11 MED ORDER — AMIODARONE HCL 200 MG PO TABS: 200.0000 mg | ORAL_TABLET | Freq: Every day | ORAL | 0 refills | Status: DC

## 2020-06-11 NOTE — Addendum Note (Signed)
Addended by: [name] on: 06/11/2020 02:42 PM   Modules accepted: Orders

## 2020-06-11 NOTE — Progress Notes (Signed)
Thanks,  She was supposed to cut back to the 200 mg daily maintenance dose of amiodarone as of yesterday. I will make sure we call her and confirm that she has cut back. Not sure we will be able to make a big impact on her weakness, as you pointed out... Jacqueline [name]

## 2020-06-11 NOTE — Telephone Encounter (Signed)
Spoke with the patient. She has just started taking the Amiodarone 200 mg once daily. An appointment has been made for 11/18 due to the patient wanting to follow up with Dr. [name] instead of waiting.    Per Dr. [name],  She was supposed to cut back to the 200 mg daily maintenance dose of amiodarone as of yesterday. I will make sure we call her and confirm that she has cut back.

## 2020-06-11 NOTE — Addendum Note (Signed)
Addended by: [name] on: 06/11/2020 02:44 PM   Modules accepted: Orders

## 2020-06-12 ENCOUNTER — Telehealth: Payer: Self-pay

## 2020-06-12 DIAGNOSIS — N390 Urinary tract infection, site not specified: Secondary | ICD-10-CM

## 2020-06-12 DIAGNOSIS — F4329 Adjustment disorder with other symptoms: Secondary | ICD-10-CM

## 2020-06-12 MED ORDER — ALPRAZOLAM 0.5 MG PO TABS: 0.5000 mg | ORAL_TABLET | Freq: Two times a day (BID) | ORAL | 5 refills | Status: DC | PRN

## 2020-06-12 MED ORDER — CEPHALEXIN 500 MG PO CAPS: 500.0000 mg | ORAL_CAPSULE | Freq: Three times a day (TID) | ORAL | 0 refills | Status: DC

## 2020-06-12 NOTE — Telephone Encounter (Signed)
Patient calls nurse line with multiple concerns. Patient reports that alprazolam rx is not covered by the insurance. It seems that dissolvable tablets were sent in rather than regular tablets. Please resend tablets to pharmacy if appropriate.   Patient also reports UTI symptoms. Patient states that she has increased urinary frequency, increased urgency and incontinence. Denies fever, back pain, abdominal pain or pain with urination. Patient states that she has limited transportation and would like an antibiotic to treat. Of note, patient does have recent hospitalization related to UTI.   Will forward to PCP  Please advise  [name], RN

## 2020-06-12 NOTE — Telephone Encounter (Signed)
Called.  Both Rxes sent as requested.  Patient aware.  Confirmed dysuria and frequency.

## 2020-06-13 ENCOUNTER — Ambulatory Visit: Payer: PPO | Admitting: Cardiovascular Disease

## 2020-06-13 ENCOUNTER — Other Ambulatory Visit: Payer: Self-pay

## 2020-06-13 VITALS — BP 126/72 | HR 58 | Ht 60.0 in | Wt 131.0 lb

## 2020-06-13 DIAGNOSIS — Z79899 Other long term (current) drug therapy: Secondary | ICD-10-CM | POA: Diagnosis not present

## 2020-06-13 DIAGNOSIS — N390 Urinary tract infection, site not specified: Secondary | ICD-10-CM

## 2020-06-13 DIAGNOSIS — Z7901 Long term (current) use of anticoagulants: Secondary | ICD-10-CM

## 2020-06-13 DIAGNOSIS — E7800 Pure hypercholesterolemia, unspecified: Secondary | ICD-10-CM

## 2020-06-13 DIAGNOSIS — I2721 Secondary pulmonary arterial hypertension: Secondary | ICD-10-CM | POA: Diagnosis not present

## 2020-06-13 DIAGNOSIS — I951 Orthostatic hypotension: Secondary | ICD-10-CM | POA: Diagnosis not present

## 2020-06-13 DIAGNOSIS — I5032 Chronic diastolic (congestive) heart failure: Secondary | ICD-10-CM | POA: Diagnosis not present

## 2020-06-13 DIAGNOSIS — N184 Chronic kidney disease, stage 4 (severe): Secondary | ICD-10-CM | POA: Diagnosis not present

## 2020-06-13 DIAGNOSIS — I480 Paroxysmal atrial fibrillation: Secondary | ICD-10-CM | POA: Diagnosis not present

## 2020-06-13 DIAGNOSIS — Z5181 Encounter for therapeutic drug level monitoring: Secondary | ICD-10-CM

## 2020-06-13 DIAGNOSIS — I25708 Atherosclerosis of coronary artery bypass graft(s), unspecified, with other forms of angina pectoris: Secondary | ICD-10-CM

## 2020-06-13 MED ORDER — PRAVASTATIN SODIUM 40 MG PO TABS: 40.0000 mg | ORAL_TABLET | Freq: Every evening | ORAL | 3 refills | Status: DC

## 2020-06-13 NOTE — Progress Notes (Signed)
Cardiology Office Note    Date:  06/15/2020   ID:  Jacqueline [name], Gerald 1931-02-28, MRN [id number]  PCP:  [name], MD  Cardiologist:  Jerrald Doverspike Electrophysiologist:  None   Evaluation Performed:  Follow-Up Visit  Chief Complaint:  F/U AFib  History of Present Illness:    Jacqueline [name] is a 86 y.o. female with coronary artery disease and previous bypass surgery (2006), paroxysmal atrial fibrillation requiring previous cardioversion, diastolic heart failure, HTN.  She was hospitalized October 30 with a Klebsiella urinary tract infection atrial fibrillation with rapid ventricular response.  Her dose of amiodarone was increased to 400 mg daily for 2 weeks.  She has maintained sinus rhythm and has been checking her blood pressure and heart rates at home.  Her heart rate has been quite slow around 50 bpm over the last couple of days, even though she cut back the amiodarone to 200 mg once daily yesterday.  She has been feeling rather weak, but has not had presyncope or syncope.  She has not had any falls.  She still has orthostatic dizziness.  Her systolic blood pressure is often in the 518A-416S, but her diastolic blood pressure remains rather low, often in the 50s, although today it was better. She is not taking any antihypertensive medications anymore.  She denies anginal dyspnea but is quite sedentary.  She has not had palpitations orthopnea, PND, lower extremity edema.  She saw Dr. Andria Frames on 06/10/2020 and apparently has a new urinary tract infection, she did have dysuria and frequency, which has improved rapidly with antibiotics.  She had a protracted episode of persistent atrial fibrillation in summer 2020 on a low-dose of amiodarone.  The dose of amiodarone was increased back to 200 mg daily but before she could undergo cardioversion she presented with rectal bleeding due to hemorrhoids and diverticulosis which required an interruption in her anticoagulants.  She eventually  underwent TEE guided cardioversion on July 6.  She had transient atrial fibrillation during urinary tract infection in October 2021.    Past Medical History:  Diagnosis Date  . Arthritis    back  . Atrial fibrillation with rapid ventricular response ([place]) 02/2014   a. CHA2DS2VASc = 6 -> on eliquis;  b. 02/2014 s/p DCCV;  c. 08/2014 Echo: EF 55-60%, Gr 2 DD, mild MR, triv AI.  Marland Kitchen CAD (coronary artery disease)    a. s/p CABG x 2 (LIMA->LAD, VG->Diag);  b. 08/2014 Cath: LM nl, LAD 90p, LCX nl, RCA nl/dominant, LIMA->LAD atretic, VG->D2 patent w/ retrograde filling of LAD, EF 55-60%-->Med Rx.  . Diverticulitis 02/21/2012  . GERD (gastroesophageal reflux disease)   . Hyperlipemia   . Hypertension   . Insomnia    Past Surgical History:  Procedure Laterality Date  . ABDOMINAL HYSTERECTOMY  1975  . CARDIAC CATHETERIZATION N/A 05/09/2015   Procedure: Right Heart Cath;  Surgeon: Larey Dresser, MD;  Location: [facility];  Service: Cardiovascular;  Laterality: N/A;  . CARDIOVERSION N/A 02/28/2014   Procedure: CARDIOVERSION;  Surgeon: Sanda Klein, MD;  Location: [facility];  Service: Cardiovascular;  Laterality: N/A;  . CARDIOVERSION N/A 01/30/2019   Procedure: CARDIOVERSION;  Surgeon: Fay Records, MD;  Location: [facility];  Service: Cardiovascular;  Laterality: N/A;  . CATARACT EXTRACTION Bilateral 2014   with lens implanted  . COLONOSCOPY N/A 02/19/2015   Procedure: COLONOSCOPY;  Surgeon: Ladene Artist, MD;  Location: [facility] ENDOSCOPY;  Service: Endoscopy;  Laterality: N/A;  . CORONARY ARTERY BYPASS  GRAFT  2006   off-pump bypass surgery with LIMA to the LAD and SVG to the second diagonal artery ( performed by Dr Servando Snare)   . JOINT REPLACEMENT Bilateral    L-2004, R-2006  . LEFT HEART CATHETERIZATION WITH CORONARY/GRAFT ANGIOGRAM N/A 09/17/2014   Procedure: LEFT HEART CATHETERIZATION WITH Beatrix Fetters;  Surgeon: Troy Sine, MD;  Location: [facility] CATH LAB;  Service:  Cardiovascular;  Laterality: N/A;  . REIMPLANTATION OF TOTAL KNEE Right 10/08/2014   Procedure: REIMPLANTATION OF RIGHT TOTAL KNEE ARTHROPLASTY WITH REMOVAL OF ANTIBIOTIC SPACER;  Surgeon: Paralee Cancel, MD;  Location: [facility] ORS;  Service: Orthopedics;  Laterality: Right;  . ROTATOR CUFF REPAIR Bilateral    r-1999, l- 2005  . TEE WITHOUT CARDIOVERSION N/A 02/28/2014   Procedure: TRANSESOPHAGEAL ECHOCARDIOGRAM (TEE);  Surgeon: Sanda Klein, MD;  Location: [facility];  Service: Cardiovascular;  Laterality: N/A;  . TEE WITHOUT CARDIOVERSION N/A 01/30/2019   Procedure: TRANSESOPHAGEAL ECHOCARDIOGRAM (TEE);  Surgeon: Fay Records, MD;  Location: [facility];  Service: Cardiovascular;  Laterality: N/A;  . TOTAL KNEE ARTHROPLASTY Right 07/02/2014   Procedure: Resection of Infected Right Total Knee Arthroplasty with placement antibiotic spacer;  Surgeon: Mauri Pole, MD;  Location: [facility] ORS;  Service: Orthopedics;  Laterality: Right;     Current Meds  Medication Sig  . acetaminophen (TYLENOL) 500 MG tablet Take 500 mg by mouth every 6 (six) hours as needed for moderate pain (pain/headache.).   Marland Kitchen ALPRAZolam (XANAX) 0.5 MG tablet Take 1 tablet (0.5 mg total) by mouth 2 (two) times daily as needed for anxiety.  Marland Kitchen amiodarone (PACERONE) 200 MG tablet Take 1 tablet (200 mg total) by mouth daily.  Marland Kitchen apixaban (ELIQUIS) 2.5 MG TABS tablet Take 1 tablet (2.5 mg total) by mouth 2 (two) times daily.  . cephALEXin (KEFLEX) 500 MG capsule Take 1 capsule (500 mg total) by mouth 3 (three) times daily.  . cholecalciferol (VITAMIN D3) 25 MCG (1000 UNIT) tablet Take 1,000 Units by mouth daily.  . cycloSPORINE (RESTASIS) 0.05 % ophthalmic emulsion Place 1 drop into both eyes 2 (two) times daily.  Marland Kitchen gabapentin (NEURONTIN) 100 MG capsule TAKE 1 CAPSULE (100 MG TOTAL) BY MOUTH 3 (THREE) TIMES DAILY.  . hydrALAZINE (APRESOLINE) 25 MG tablet TAKE 1 TABLET BY MOUTH EVERY DAY FOR SBP ABOVE 160 AS NEEDED UP TO 2 TABS DAILY  .  levothyroxine (SYNTHROID) 75 MCG tablet TAKE 1 TABLET BY MOUTH EVERY DAY  . meclizine (ANTIVERT) 12.5 MG tablet Take 1 tablet (12.5 mg total) by mouth 3 (three) times daily as needed for dizziness.  . Multiple Vitamin (MULTIVITAMIN WITH MINERALS) TABS tablet Take 1 tablet by mouth daily.  . nitroGLYCERIN (NITROSTAT) 0.4 MG SL tablet PLACE 1 TABLET UNDER THE TONGUE EVERY 5 MINUTES AS NEEDED FOR CHEST PAIN UP TO 3 DOSES (Patient taking differently: Place 0.4 mg under the tongue every 5 (five) minutes x 3 doses as needed for chest pain. PLACE 1 TABLET UNDER THE TONGUE EVERY 5 MINUTES AS NEEDED FOR CHEST PAIN UP TO 3 DOSES)  . Omega-3 Fatty Acids (FISH OIL) 1000 MG CAPS Take 1,000 mg by mouth 2 (two) times a day.   Marland Kitchen omeprazole (PRILOSEC) 20 MG capsule TAKE 1 CAPSULE BY MOUTH EVERY DAY  . Polyethyl Glycol-Propyl Glycol (LUBRICANT EYE DROPS) 0.4-0.3 % SOLN Place 1-2 drops into both eyes 3 (three) times daily as needed (dry/irritated eyes.).  Marland Kitchen polyethylene glycol powder ([employer] PURELAX) 17 GM/SCOOP powder MIX 17 G AND TAKE BY MOUTH DAILY. (  Patient taking differently: Take 1 Container by mouth 3 (three) times a week. )  . rOPINIRole (REQUIP) 0.25 MG tablet TAKE 1 TABLET BY MOUTH THREE TIMES A DAY  . traMADol (ULTRAM) 50 MG tablet TAKE 1 TABLET BY MOUTH EVERY 12 HOURS AS NEEDED FOR PAIN (Patient taking differently: Take 50 mg by mouth every 12 (twelve) hours as needed for moderate pain or severe pain. )  . traZODone (DESYREL) 100 MG tablet TAKE 1 TABLET BY MOUTH EVERYDAY AT BEDTIME (Patient taking differently: Take 100 mg by mouth at bedtime. )  . [DISCONTINUED] simvastatin (ZOCOR) 20 MG tablet TAKE 1 TABLET BY MOUTH EVERYDAY AT BEDTIME (Patient taking differently: Take 20 mg by mouth every evening. )     Allergies:   Atorvastatin, Beta adrenergic blockers, Crestor [rosuvastatin], Morphine and related, and Prednisone   Social History   Tobacco Use  . Smoking status: Never Smoker  . Smokeless tobacco: Never  Used  Vaping Use  . Vaping Use: Never used  Substance Use Topics  . Alcohol use: No  . Drug use: No     Family Hx: The patient's family history includes Arthritis in her brother; Cancer in her brother; Heart attack in her brother; Parkinson's disease in her mother. There is no history of Breast cancer.  ROS:   Please see the history of present illness.    All other systems are reviewed and are negative.  Prior CV studies:   The following studies were reviewed today:  Notes from visit with Dr. Andria Frames on 06/10/2020  Echocardiogram 04/23/2020   1. Left ventricular ejection fraction, by estimation, is 55 to 60%. The left ventricle has normal function. The left ventricle has no regional wall motion abnormalities. Left ventricular diastolic parameters are consistent with Grade III diastolic dysfunction (restrictive). Elevated left atrial pressure.  2. Right ventricular systolic function is normal. The right ventricular size is normal. There is mildly elevated pulmonary artery systolic pressure.  3. Left atrial size was moderately dilated.  4. The mitral valve is normal in structure. Mild mitral valve regurgitation. No evidence of mitral stenosis.  5. The aortic valve is tricuspid. Aortic valve regurgitation is trivial. Mild aortic valve sclerosis is present, with no evidence of aortic valve stenosis.  6. The inferior vena cava is normal in size with greater than 50% respiratory variability, suggesting right atrial pressure of 3 mmHg.  Addendum: On my review the diastolic function does not really qualify for restrictive filling, but rather for pseudonormal filling. [facility])     Labs/Other Tests and Data Reviewed:    EKG: Ordered today, shows sinus bradycardia at 50 bpm, normal tracing, QTC 426 ms  Recent Labs: 05/25/2020: B Natriuretic Peptide 284.2; TSH 1.933 05/26/2020: ALT 21; Magnesium 2.0 06/10/2020: BUN 16; Creatinine, Ser 1.59; Hemoglobin 11.8; Platelets 164; Potassium 4.7;  Sodium 142   Recent Lipid Panel Lab Results  Component Value Date/Time   CHOL 135 12/13/2019 11:02 AM   TRIG 116 12/13/2019 11:02 AM   HDL 62 12/13/2019 11:02 AM   CHOLHDL 2.2 12/13/2019 11:02 AM   CHOLHDL 2.8 04/08/2016 11:53 AM   LDLCALC 52 12/13/2019 11:02 AM    Wt Readings from Last 3 Encounters:  06/13/20 131 lb (59.4 kg)  06/10/20 131 lb 3.2 oz (59.5 kg)  05/21/20 132 lb (59.9 kg)     Objective:    Vital Signs:  BP 126/72   Pulse (!) 58   Ht 5' (1.524 m)   Wt 131 lb (59.4 kg)   SpO2  97%   BMI 25.58 kg/m      General: Alert, oriented x3, no distress, elderly, appears a little more frail Head: no evidence of trauma, PERRL, EOMI, no exophtalmos or lid lag, no myxedema, no xanthelasma; normal ears, nose and oropharynx Neck: normal jugular venous pulsations and no hepatojugular reflux; brisk carotid pulses without delay and no carotid bruits Chest: clear to auscultation, no signs of consolidation by percussion or palpation, normal fremitus, symmetrical and full respiratory excursions Cardiovascular: normal position and quality of the apical impulse, regular rhythm, normal first and second heart sounds, no murmurs, rubs or gallops Abdomen: no tenderness or distention, no masses by palpation, no abnormal pulsatility or arterial bruits, normal bowel sounds, no hepatosplenomegaly Extremities: no clubbing, cyanosis or edema; 2+ radial, ulnar and brachial pulses bilaterally; 2+ right femoral, posterior tibial and dorsalis pedis pulses; 2+ left femoral, posterior tibial and dorsalis pedis pulses; no subclavian or femoral bruits Neurological: grossly nonfocal Psych: Normal mood and affect    ASSESSMENT & PLAN:    1. Paroxysmal atrial fibrillation (HCC)   2. Encounter for monitoring amiodarone therapy   3. Long term (current) use of anticoagulants   4. Coronary artery disease of bypass graft of native heart with stable angina pectoris ([place])   5. Chronic diastolic CHF  (congestive heart failure) ([place])   6. PAH (pulmonary artery hypertension) ([place])   7. CKD (chronic kidney disease) stage 4, GFR 15-29 ml/min (HCC)   8. Hypercholesterolemia   9. Orthostatic hypotension   10. Urinary tract infection without hematuria, site unspecified      1. AFib/flutter:  Prolonged symptomatic episode in 2020 coinciding with GI bleeding, recurrent symptomatic episode of urinary tract infection last month.  Now maintaining sinus rhythm, but has developed worsening bradycardia on the higher dose of amiodarone.   CHADSVasc 6 (age 4, gender, CAD, CHF, HTN). 2. Amiodarone: When she was taking amiodarone 200 mg once daily, she did not have pauses or severe bradycardia on a 1 week event monitor. Normal liver function tests and TSH in October 2021.  Recheck labs in April.  Reminded her about the need for yearly eye exam and the importance of prompt reporting any otherwise unexplained respiratory symptoms. 3. Eliquis: On reduced dose of anticoagulant due to age body size and impaired renal function, has not had any bleeding issues in well over a year.  Hemoglobin was 10.6 during last hospitalization, close to her baseline of 11. 4. CAD:  Or antianginal medications have been discontinued due to weakness and falls.  She does not have angina with her sedentary lifestyle. 5. CHF:  Clinically appears euvolemic, NYHA functional class I-II, limited by weakness.  Left ventricular systolic function is normal.  Her echocardiogram suggests increased filling pressures (study performed September 28), but we are unable to give her diuretics without worsening her diastolic blood pressure and increasing the risk for falls.  She does not have shortness of breath with her limited activity level. 6. PAH:  Most likely secondary to left heart diastolic failure. 7. CKD: Creatinine remained stable with a baseline around 1.5-1.7 (GFR 25-30). 8. HLP: LDL at target well under 70, on statin.  Replace simvastatin with  pravastatin due to the interaction with amiodarone. 9. Orthostatic hypotension/HTN:  Has had problems with symptomatic hypotension with orthostasis and low diastolic blood pressures.  She is off all antihypertensive and antianginal medications and is not on diuretics.  Encourage compression stockings, may need midodrine and/or abdominal binder.  Avoid fludrocortisone since this would precipitate heart  failure 10. UTI: Symptoms are improving rapidly on cephalexin.    Patient Instructions  Medication Instructions:  STOP the Simvastatin START Pravastatin 40 mg once daily at dinner  Eliquis 2.5 mg samples given (3 boxes, Lot [id number])  *If you need a refill on your cardiac medications before your next appointment, please call your pharmacy*   Lab Work: None ordered If you have labs (blood work) drawn today and your tests are completely normal, you will receive your results only by: Marland Kitchen MyChart Message (if you have MyChart) OR . A paper copy in the mail If you have any lab test that is abnormal or we need to change your treatment, we will call you to review the results.   Testing/Procedures: None ordered   Follow-Up: At [facility], you and your health needs are our priority.  As part of our continuing mission to provide you with exceptional heart care, we have created designated Provider Care Teams.  These Care Teams include your primary Cardiologist (physician) and Advanced Practice Providers (APPs -  Physician Assistants and Nurse Practitioners) who all work together to provide you with the care you need, when you need it.  We recommend signing up for the patient portal called "MyChart".  Sign up information is provided on this After Visit Summary.  MyChart is used to connect with patients for Virtual Visits (Telemedicine).  Patients are able to view lab/test results, encounter notes, upcoming appointments, etc.  Non-urgent messages can be sent to your provider as well.   To learn more  about what you can do with MyChart, go to [email or web address].    Your next appointment:   3 month(s)  The format for your next appointment:   In Person  Provider:   You may see Sanda Klein, MD or one of the following Advanced Practice Providers on your designated Care Team:    Almyra Deforest, PA-C  Fabian Sharp, [place] or   Roby Lofts, PA-C       Signed, Sanda Klein, MD  06/15/2020 9:32 AM    [place]

## 2020-06-13 NOTE — Patient Instructions (Addendum)
Medication Instructions:  STOP the Simvastatin START Pravastatin 40 mg once daily at dinner  Eliquis 2.5 mg samples given (3 boxes, Lot [id number])  *If you need a refill on your cardiac medications before your next appointment, please call your pharmacy*   Lab Work: None ordered If you have labs (blood work) drawn today and your tests are completely normal, you will receive your results only by: Marland Kitchen MyChart Message (if you have MyChart) OR . A paper copy in the mail If you have any lab test that is abnormal or we need to change your treatment, we will call you to review the results.   Testing/Procedures: None ordered   Follow-Up: At [facility], you and your health needs are our priority.  As part of our continuing mission to provide you with exceptional heart care, we have created designated Provider Care Teams.  These Care Teams include your primary Cardiologist (physician) and Advanced Practice Providers (APPs -  Physician Assistants and Nurse Practitioners) who all work together to provide you with the care you need, when you need it.  We recommend signing up for the patient portal called "MyChart".  Sign up information is provided on this After Visit Summary.  MyChart is used to connect with patients for Virtual Visits (Telemedicine).  Patients are able to view lab/test results, encounter notes, upcoming appointments, etc.  Non-urgent messages can be sent to your provider as well.   To learn more about what you can do with MyChart, go to [email or web address].    Your next appointment:   3 month(s)  The format for your next appointment:   In Person  Provider:   You may see Sanda Klein, MD or one of the following Advanced Practice Providers on your designated Care Team:    Almyra Deforest, PA-C  Fabian Sharp, PA-C or   Roby Lofts, [place]

## 2020-06-15 ENCOUNTER — Encounter: Payer: Self-pay | Admitting: Cardiovascular Disease

## 2020-06-17 ENCOUNTER — Telehealth: Payer: Self-pay

## 2020-06-17 DIAGNOSIS — R296 Repeated falls: Secondary | ICD-10-CM

## 2020-06-17 NOTE — Telephone Encounter (Signed)
Patient calls nurse line stating no one has reached out to her for home health therapy. I see in providers note where [facility] PT was mentioned, however I do not see an active order. Last referral for PT has expired, as it has been greater than one year. Please advise.

## 2020-06-18 ENCOUNTER — Ambulatory Visit: Payer: PPO | Admitting: General Practice

## 2020-06-24 DIAGNOSIS — E039 Hypothyroidism, unspecified: Secondary | ICD-10-CM | POA: Diagnosis not present

## 2020-06-24 DIAGNOSIS — F4320 Adjustment disorder, unspecified: Secondary | ICD-10-CM | POA: Diagnosis not present

## 2020-06-24 DIAGNOSIS — I495 Sick sinus syndrome: Secondary | ICD-10-CM | POA: Diagnosis not present

## 2020-06-24 DIAGNOSIS — Z85828 Personal history of other malignant neoplasm of skin: Secondary | ICD-10-CM | POA: Diagnosis not present

## 2020-06-24 DIAGNOSIS — I5032 Chronic diastolic (congestive) heart failure: Secondary | ICD-10-CM | POA: Diagnosis not present

## 2020-06-24 DIAGNOSIS — Z79891 Long term (current) use of opiate analgesic: Secondary | ICD-10-CM | POA: Diagnosis not present

## 2020-06-24 DIAGNOSIS — Z87440 Personal history of urinary (tract) infections: Secondary | ICD-10-CM | POA: Diagnosis not present

## 2020-06-24 DIAGNOSIS — Z7901 Long term (current) use of anticoagulants: Secondary | ICD-10-CM | POA: Diagnosis not present

## 2020-06-24 DIAGNOSIS — G4700 Insomnia, unspecified: Secondary | ICD-10-CM | POA: Diagnosis not present

## 2020-06-24 DIAGNOSIS — D500 Iron deficiency anemia secondary to blood loss (chronic): Secondary | ICD-10-CM | POA: Diagnosis not present

## 2020-06-24 DIAGNOSIS — Z951 Presence of aortocoronary bypass graft: Secondary | ICD-10-CM | POA: Diagnosis not present

## 2020-06-24 DIAGNOSIS — G609 Hereditary and idiopathic neuropathy, unspecified: Secondary | ICD-10-CM | POA: Diagnosis not present

## 2020-06-24 DIAGNOSIS — R251 Tremor, unspecified: Secondary | ICD-10-CM | POA: Diagnosis not present

## 2020-06-24 DIAGNOSIS — I480 Paroxysmal atrial fibrillation: Secondary | ICD-10-CM | POA: Diagnosis not present

## 2020-06-24 DIAGNOSIS — I2721 Secondary pulmonary arterial hypertension: Secondary | ICD-10-CM | POA: Diagnosis not present

## 2020-06-24 DIAGNOSIS — E7800 Pure hypercholesterolemia, unspecified: Secondary | ICD-10-CM | POA: Diagnosis not present

## 2020-06-24 DIAGNOSIS — K219 Gastro-esophageal reflux disease without esophagitis: Secondary | ICD-10-CM | POA: Diagnosis not present

## 2020-06-24 DIAGNOSIS — N184 Chronic kidney disease, stage 4 (severe): Secondary | ICD-10-CM | POA: Diagnosis not present

## 2020-06-24 DIAGNOSIS — I130 Hypertensive heart and chronic kidney disease with heart failure and stage 1 through stage 4 chronic kidney disease, or unspecified chronic kidney disease: Secondary | ICD-10-CM | POA: Diagnosis not present

## 2020-06-24 DIAGNOSIS — M810 Age-related osteoporosis without current pathological fracture: Secondary | ICD-10-CM | POA: Diagnosis not present

## 2020-06-24 DIAGNOSIS — I700 Atherosclerosis of aorta: Secondary | ICD-10-CM | POA: Diagnosis not present

## 2020-07-02 ENCOUNTER — Other Ambulatory Visit: Payer: Self-pay | Admitting: Family Medicine

## 2020-07-02 DIAGNOSIS — I10 Essential (primary) hypertension: Secondary | ICD-10-CM

## 2020-07-03 DIAGNOSIS — N184 Chronic kidney disease, stage 4 (severe): Secondary | ICD-10-CM | POA: Diagnosis not present

## 2020-07-03 DIAGNOSIS — Z85828 Personal history of other malignant neoplasm of skin: Secondary | ICD-10-CM | POA: Diagnosis not present

## 2020-07-03 DIAGNOSIS — F4320 Adjustment disorder, unspecified: Secondary | ICD-10-CM | POA: Diagnosis not present

## 2020-07-03 DIAGNOSIS — I5032 Chronic diastolic (congestive) heart failure: Secondary | ICD-10-CM | POA: Diagnosis not present

## 2020-07-03 DIAGNOSIS — Z951 Presence of aortocoronary bypass graft: Secondary | ICD-10-CM | POA: Diagnosis not present

## 2020-07-03 DIAGNOSIS — I480 Paroxysmal atrial fibrillation: Secondary | ICD-10-CM | POA: Diagnosis not present

## 2020-07-03 DIAGNOSIS — M810 Age-related osteoporosis without current pathological fracture: Secondary | ICD-10-CM | POA: Diagnosis not present

## 2020-07-03 DIAGNOSIS — I700 Atherosclerosis of aorta: Secondary | ICD-10-CM | POA: Diagnosis not present

## 2020-07-03 DIAGNOSIS — D500 Iron deficiency anemia secondary to blood loss (chronic): Secondary | ICD-10-CM | POA: Diagnosis not present

## 2020-07-03 DIAGNOSIS — E039 Hypothyroidism, unspecified: Secondary | ICD-10-CM | POA: Diagnosis not present

## 2020-07-03 DIAGNOSIS — I130 Hypertensive heart and chronic kidney disease with heart failure and stage 1 through stage 4 chronic kidney disease, or unspecified chronic kidney disease: Secondary | ICD-10-CM | POA: Diagnosis not present

## 2020-07-03 DIAGNOSIS — K219 Gastro-esophageal reflux disease without esophagitis: Secondary | ICD-10-CM | POA: Diagnosis not present

## 2020-07-03 DIAGNOSIS — Z7901 Long term (current) use of anticoagulants: Secondary | ICD-10-CM | POA: Diagnosis not present

## 2020-07-03 DIAGNOSIS — Z79891 Long term (current) use of opiate analgesic: Secondary | ICD-10-CM | POA: Diagnosis not present

## 2020-07-03 DIAGNOSIS — Z87440 Personal history of urinary (tract) infections: Secondary | ICD-10-CM | POA: Diagnosis not present

## 2020-07-03 DIAGNOSIS — G609 Hereditary and idiopathic neuropathy, unspecified: Secondary | ICD-10-CM | POA: Diagnosis not present

## 2020-07-03 DIAGNOSIS — E7800 Pure hypercholesterolemia, unspecified: Secondary | ICD-10-CM | POA: Diagnosis not present

## 2020-07-03 DIAGNOSIS — I495 Sick sinus syndrome: Secondary | ICD-10-CM | POA: Diagnosis not present

## 2020-07-03 DIAGNOSIS — I2721 Secondary pulmonary arterial hypertension: Secondary | ICD-10-CM | POA: Diagnosis not present

## 2020-07-03 DIAGNOSIS — G4700 Insomnia, unspecified: Secondary | ICD-10-CM | POA: Diagnosis not present

## 2020-07-03 DIAGNOSIS — R251 Tremor, unspecified: Secondary | ICD-10-CM | POA: Diagnosis not present

## 2020-07-09 ENCOUNTER — Telehealth: Payer: Self-pay

## 2020-07-09 MED ORDER — NITROFURANTOIN MACROCRYSTAL 50 MG PO CAPS: 50.0000 mg | ORAL_CAPSULE | Freq: Four times a day (QID) | ORAL | 0 refills | Status: DC

## 2020-07-09 NOTE — Telephone Encounter (Signed)
Patient calls nurse line requesting abx for possible UTI. Patient reports painful, burning sensation with urination and increased frequency. Patient denies abdominal pain, back pain and fever.   Offered to schedule appointment for evaluation, however, patient has limited transportation and states that she would not be able to come into the clinic today.   To PCP  Please advise  [name], RN

## 2020-07-09 NOTE — Telephone Encounter (Signed)
Has suprapubic pressure and dysuria.  No flank pain.   Just treated with keflex 7 days beginning 11/17.  Will switch and treat with empiric macrodantin.

## 2020-07-23 ENCOUNTER — Other Ambulatory Visit: Payer: Self-pay | Admitting: General Practice

## 2020-07-31 ENCOUNTER — Telehealth: Payer: Self-pay | Admitting: Cardiovascular Disease

## 2020-07-31 MED ORDER — MIDODRINE HCL 2.5 MG PO TABS: ORAL_TABLET | ORAL | 3 refills | Status: DC

## 2020-07-31 NOTE — Telephone Encounter (Signed)
Please prescribe midodrine 2.5 mg twice daily (on awakening in the morning and 6 hours later). OK to give a 3 month supply. Do not take if systolic BP is 833 or higher. Do not lie flat in bed for 4 hours fater taking the medication, OK to recline in recliner.  The medication can be adjusted upwards, but the goal is to relieve the weakness and dizziness, not necessarily to achieve a certain BP number. Please make follow up appt in 1-2 months and continue to keep a BP log. Thanks

## 2020-07-31 NOTE — Telephone Encounter (Signed)
New Message:     Pt says her blood pressure is running low. She wants to know if Dr C can give her something for this please.     Pt c/o BP issue: STAT if pt c/o blurred vision, one-sided weakness or slurred speech  1. What are your last 5 BP readings? 97/45 111/47 108/49 and pulse 65  2. Are you having any other symptoms (ex. Dizziness, headache, blurred vision, passed out)? *dizziness and feels weak  3. What is your BP issue? Blood pressure is running low

## 2020-07-31 NOTE — Telephone Encounter (Signed)
Returned the call to the patient of Dr. [name]. She stated that she has been having low blood pressures and feelings of being weak and tired. She has been having dizziness when sitting and standing.  Blood pressure and heart rates in the morning:  1/5 111/47 65 1/4 120/57 66 1/3 116/49 66  When going over the medications with her she has been taking Hydralazine 25 mg once a day. She has been advised to stop taking this since it is prescribed as needed for a systolic over 429. She will keep a log of her blood pressures and call back if her symptoms do not improve.

## 2020-07-31 NOTE — Telephone Encounter (Signed)
The patient has been made aware and verbalized her understanding. Midodrine 2.5 mg bid was sent in for her.   She has an appointment on 09/25/20.

## 2020-08-01 ENCOUNTER — Telehealth: Payer: Self-pay | Admitting: Cardiovascular Disease

## 2020-08-01 NOTE — Telephone Encounter (Signed)
    Pt's son is calling back. He would like to speak with a nurse. He would like to discuss about pt's heart issue. He is aware about the call yesterday but he said he have some questions.

## 2020-08-01 NOTE — Telephone Encounter (Signed)
Unfortunately, a pacemaker would not help with orthostatic hypotension. The midodrine may help and dose can be escalated if needed.

## 2020-08-01 NOTE — Telephone Encounter (Signed)
Spoke with son, aware of dr c comments.

## 2020-08-01 NOTE — Telephone Encounter (Signed)
Spoke with pt son, he reports dr c had mentioned in the past about his mother getting a pacemaker and he is wondering if that would help with her current weakness. Aware will forward to dr c for his advise.

## 2020-08-06 DIAGNOSIS — D500 Iron deficiency anemia secondary to blood loss (chronic): Secondary | ICD-10-CM | POA: Diagnosis not present

## 2020-08-06 DIAGNOSIS — N184 Chronic kidney disease, stage 4 (severe): Secondary | ICD-10-CM | POA: Diagnosis not present

## 2020-08-06 DIAGNOSIS — I130 Hypertensive heart and chronic kidney disease with heart failure and stage 1 through stage 4 chronic kidney disease, or unspecified chronic kidney disease: Secondary | ICD-10-CM | POA: Diagnosis not present

## 2020-08-06 DIAGNOSIS — I480 Paroxysmal atrial fibrillation: Secondary | ICD-10-CM | POA: Diagnosis not present

## 2020-08-06 DIAGNOSIS — M810 Age-related osteoporosis without current pathological fracture: Secondary | ICD-10-CM | POA: Diagnosis not present

## 2020-08-06 DIAGNOSIS — I2721 Secondary pulmonary arterial hypertension: Secondary | ICD-10-CM | POA: Diagnosis not present

## 2020-08-06 DIAGNOSIS — I5032 Chronic diastolic (congestive) heart failure: Secondary | ICD-10-CM | POA: Diagnosis not present

## 2020-08-06 DIAGNOSIS — K219 Gastro-esophageal reflux disease without esophagitis: Secondary | ICD-10-CM | POA: Diagnosis not present

## 2020-08-06 DIAGNOSIS — Z87440 Personal history of urinary (tract) infections: Secondary | ICD-10-CM | POA: Diagnosis not present

## 2020-08-06 DIAGNOSIS — Z79891 Long term (current) use of opiate analgesic: Secondary | ICD-10-CM | POA: Diagnosis not present

## 2020-08-06 DIAGNOSIS — E7800 Pure hypercholesterolemia, unspecified: Secondary | ICD-10-CM | POA: Diagnosis not present

## 2020-08-06 DIAGNOSIS — G4700 Insomnia, unspecified: Secondary | ICD-10-CM | POA: Diagnosis not present

## 2020-08-06 DIAGNOSIS — R251 Tremor, unspecified: Secondary | ICD-10-CM | POA: Diagnosis not present

## 2020-08-06 DIAGNOSIS — I700 Atherosclerosis of aorta: Secondary | ICD-10-CM | POA: Diagnosis not present

## 2020-08-06 DIAGNOSIS — I495 Sick sinus syndrome: Secondary | ICD-10-CM | POA: Diagnosis not present

## 2020-08-06 DIAGNOSIS — G609 Hereditary and idiopathic neuropathy, unspecified: Secondary | ICD-10-CM | POA: Diagnosis not present

## 2020-08-06 DIAGNOSIS — Z951 Presence of aortocoronary bypass graft: Secondary | ICD-10-CM | POA: Diagnosis not present

## 2020-08-06 DIAGNOSIS — F4320 Adjustment disorder, unspecified: Secondary | ICD-10-CM | POA: Diagnosis not present

## 2020-08-06 DIAGNOSIS — Z85828 Personal history of other malignant neoplasm of skin: Secondary | ICD-10-CM | POA: Diagnosis not present

## 2020-08-06 DIAGNOSIS — Z7901 Long term (current) use of anticoagulants: Secondary | ICD-10-CM | POA: Diagnosis not present

## 2020-08-06 DIAGNOSIS — E039 Hypothyroidism, unspecified: Secondary | ICD-10-CM | POA: Diagnosis not present

## 2020-08-08 ENCOUNTER — Other Ambulatory Visit: Payer: Self-pay | Admitting: Cardiovascular Disease

## 2020-08-20 DIAGNOSIS — I130 Hypertensive heart and chronic kidney disease with heart failure and stage 1 through stage 4 chronic kidney disease, or unspecified chronic kidney disease: Secondary | ICD-10-CM | POA: Diagnosis not present

## 2020-08-20 DIAGNOSIS — Z79891 Long term (current) use of opiate analgesic: Secondary | ICD-10-CM | POA: Diagnosis not present

## 2020-08-20 DIAGNOSIS — E039 Hypothyroidism, unspecified: Secondary | ICD-10-CM | POA: Diagnosis not present

## 2020-08-20 DIAGNOSIS — Z85828 Personal history of other malignant neoplasm of skin: Secondary | ICD-10-CM | POA: Diagnosis not present

## 2020-08-20 DIAGNOSIS — M810 Age-related osteoporosis without current pathological fracture: Secondary | ICD-10-CM | POA: Diagnosis not present

## 2020-08-20 DIAGNOSIS — I495 Sick sinus syndrome: Secondary | ICD-10-CM | POA: Diagnosis not present

## 2020-08-20 DIAGNOSIS — Z7901 Long term (current) use of anticoagulants: Secondary | ICD-10-CM | POA: Diagnosis not present

## 2020-08-20 DIAGNOSIS — I2721 Secondary pulmonary arterial hypertension: Secondary | ICD-10-CM | POA: Diagnosis not present

## 2020-08-20 DIAGNOSIS — Z87440 Personal history of urinary (tract) infections: Secondary | ICD-10-CM | POA: Diagnosis not present

## 2020-08-20 DIAGNOSIS — E7800 Pure hypercholesterolemia, unspecified: Secondary | ICD-10-CM | POA: Diagnosis not present

## 2020-08-20 DIAGNOSIS — R251 Tremor, unspecified: Secondary | ICD-10-CM | POA: Diagnosis not present

## 2020-08-20 DIAGNOSIS — I5032 Chronic diastolic (congestive) heart failure: Secondary | ICD-10-CM | POA: Diagnosis not present

## 2020-08-20 DIAGNOSIS — I480 Paroxysmal atrial fibrillation: Secondary | ICD-10-CM | POA: Diagnosis not present

## 2020-08-20 DIAGNOSIS — G609 Hereditary and idiopathic neuropathy, unspecified: Secondary | ICD-10-CM | POA: Diagnosis not present

## 2020-08-20 DIAGNOSIS — I700 Atherosclerosis of aorta: Secondary | ICD-10-CM | POA: Diagnosis not present

## 2020-08-20 DIAGNOSIS — G4700 Insomnia, unspecified: Secondary | ICD-10-CM | POA: Diagnosis not present

## 2020-08-20 DIAGNOSIS — D500 Iron deficiency anemia secondary to blood loss (chronic): Secondary | ICD-10-CM | POA: Diagnosis not present

## 2020-08-20 DIAGNOSIS — F4320 Adjustment disorder, unspecified: Secondary | ICD-10-CM | POA: Diagnosis not present

## 2020-08-20 DIAGNOSIS — Z951 Presence of aortocoronary bypass graft: Secondary | ICD-10-CM | POA: Diagnosis not present

## 2020-08-20 DIAGNOSIS — K219 Gastro-esophageal reflux disease without esophagitis: Secondary | ICD-10-CM | POA: Diagnosis not present

## 2020-08-20 DIAGNOSIS — N184 Chronic kidney disease, stage 4 (severe): Secondary | ICD-10-CM | POA: Diagnosis not present

## 2020-08-27 ENCOUNTER — Ambulatory Visit: Payer: PPO | Admitting: General Practice

## 2020-08-28 DIAGNOSIS — Z961 Presence of intraocular lens: Secondary | ICD-10-CM | POA: Diagnosis not present

## 2020-08-28 DIAGNOSIS — H04129 Dry eye syndrome of unspecified lacrimal gland: Secondary | ICD-10-CM | POA: Diagnosis not present

## 2020-08-28 DIAGNOSIS — H16223 Keratoconjunctivitis sicca, not specified as Sjogren's, bilateral: Secondary | ICD-10-CM | POA: Diagnosis not present

## 2020-08-29 ENCOUNTER — Other Ambulatory Visit: Payer: Self-pay | Admitting: Family Medicine

## 2020-08-29 DIAGNOSIS — I10 Essential (primary) hypertension: Secondary | ICD-10-CM

## 2020-08-30 ENCOUNTER — Telehealth: Payer: Self-pay

## 2020-08-30 ENCOUNTER — Telehealth: Payer: Self-pay | Admitting: Cardiovascular Disease

## 2020-08-30 NOTE — Telephone Encounter (Signed)
Most of those readings are excellent or slightly low for her. Please make sure she is drinking enough water. Would not treat the "high" readings.

## 2020-08-30 NOTE — Telephone Encounter (Signed)
Spoke with patient and relayed Dr. Victorino December message. Patient verbalized understanding.

## 2020-08-30 NOTE — Telephone Encounter (Signed)
    Pt c/o BP issue: STAT if pt c/o blurred vision, one-sided weakness or slurred speech  1. What are your last 5 BP readings?  AM: BP 94/55 HR 69 After 30 mins : BP 112/63 HR 59 She took it again after few minutes: BP 122/54 HR 59 PM: 145/63 HR 54  2. Are you having any other symptoms (ex. Dizziness, headache, blurred vision, passed out)? Fatigue   3. What is your BP issue? Pt said he BP been crazy today. Her last BP in the morning was 122/54 HR 59 she took her amiodarone in the morning and another one at 3 pm, this afternoon her BP is consistently stays at 145/63 HR 54. She denied CP, SOB and headache. She said she only feels weak all day.

## 2020-08-30 NOTE — Telephone Encounter (Signed)
Patient calls nurse line regarding concerns with BP medications. Patient reports that BP has been fluctuating greatly through out the last couple of days. Most recent BP readings as below: -94/55, HR 57 -112/63, HR 59 -146/63, HR 58  Patient reports that she has had some dizziness over the last few days, however, is not currently dizzy. Reports mostly feeling weak today.   Advised patient to contact cardiology office as well for additional recommendations, as they are following her BP as well.   Strict ED precautions given in the meantime.   Please advise additional recommendations.   [name], RN

## 2020-08-30 NOTE — Telephone Encounter (Signed)
Spoke with patient. Patient has been fatigued and weak all week. No CP, SOB, dizziness or headache. Patient concerned that BP has been up and down.   Wed: 182/78, 54 133/52, 57 116/48, 56 131/68  Thurs: 161/68, 60 120/67, 69 148/67, 61 at 3p  Today: 94/55, 69 112/63, 59 122/54, 59 (took amiodarone) 145/63, 54 at 3pm  Will route to MD for review.

## 2020-09-02 NOTE — Telephone Encounter (Signed)
Called and discussed.  No easy answers for her fluctuating blood pressure.  She will make an appointment to see me for a checkup.

## 2020-09-07 ENCOUNTER — Other Ambulatory Visit: Payer: Self-pay | Admitting: Family Medicine

## 2020-09-07 DIAGNOSIS — R42 Dizziness and giddiness: Secondary | ICD-10-CM

## 2020-09-09 ENCOUNTER — Telehealth: Payer: Self-pay

## 2020-09-09 ENCOUNTER — Other Ambulatory Visit: Payer: Self-pay | Admitting: Cardiovascular Disease

## 2020-09-09 NOTE — Telephone Encounter (Signed)
Patient calls nurse line requesting to schedule follow up appointment with Dr. Andria Frames. Patient reports falling while hanging up shower curtain last Tuesday, 2/8. Denies LOC, vomiting or change in mental status. Patient is currently alert and oriented. Scheduled patient with Dr. Andria Frames on 2/17. Provided with strict return/ED precautions.   [name], RN

## 2020-09-09 NOTE — Telephone Encounter (Signed)
Noted and agree. 

## 2020-09-11 ENCOUNTER — Telehealth: Payer: Self-pay | Admitting: Family Medicine

## 2020-09-11 NOTE — Telephone Encounter (Signed)
Pt states her son said he called on Friday Feb 11th regarding letter to be sent to [facility].  Insurance will be cancelled if a letter is not sent confirming her chronic heart condition; it will be cancelled by September 24, 2020. See record for Ins. Information. Call patient's son [phone]

## 2020-09-11 NOTE — Telephone Encounter (Signed)
Patients son LVM on nurse line in regards to insurance and form. Son reports this really needs to be done by today. I checked PCP box and did not see a form. I attempted to call son to see if we can provided a generic letter stating her health conditions and fax to insurance. However, I had to leave a VM.

## 2020-09-11 NOTE — Telephone Encounter (Signed)
Noted and agree.  To verify, I do not have any form to complete at this time.  I look forward to seeing her and her son tomorrow.

## 2020-09-11 NOTE — Telephone Encounter (Signed)
Son calls back to nurse line. Patient has an apt with PCP tomorrow. Son will have her bring the paperwork with her to be filled out.

## 2020-09-12 ENCOUNTER — Other Ambulatory Visit: Payer: Self-pay

## 2020-09-12 ENCOUNTER — Ambulatory Visit (INDEPENDENT_AMBULATORY_CARE_PROVIDER_SITE_OTHER): Payer: HMO | Admitting: Family Medicine

## 2020-09-12 ENCOUNTER — Encounter: Payer: Self-pay | Admitting: Family Medicine

## 2020-09-12 DIAGNOSIS — I4819 Other persistent atrial fibrillation: Secondary | ICD-10-CM | POA: Diagnosis not present

## 2020-09-12 DIAGNOSIS — I10 Essential (primary) hypertension: Secondary | ICD-10-CM

## 2020-09-12 DIAGNOSIS — R296 Repeated falls: Secondary | ICD-10-CM

## 2020-09-12 NOTE — Patient Instructions (Addendum)
Stop the meclizine Stay on the blood thinner.  If you are falling more or especially if you are falling and hitting your head, let me know and I will likely say to stop the blood thinner. The three medicines that might make you more at risk of falling because they are mildly sedating are Gabapentin Tramadol Alprazolam. Let me know if you think you could do without any one of these meds.  I want to do everything I can to decrease your fall risk - but I don't want to make you miserable. Let your family do any housework that is above eye level.

## 2020-09-13 ENCOUNTER — Encounter: Payer: Self-pay | Admitting: Family Medicine

## 2020-09-13 NOTE — Assessment & Plan Note (Signed)
Has PT.  Stop meclizine due to risk.  Do not put herself in vulnerable position.  No work above eye level (such as hanging the shower curtain.)  Also long discussion about CNS drugs which might increase fall risk.  Those drugs are adding to her quality of life (Xanax for sleep, tramadol and gabapentin for pain.)  Added these to the AVS.  This is another risk benefit balance we are trying to strike.

## 2020-09-13 NOTE — Assessment & Plan Note (Signed)
No change.  We have a reasoable balance between highs and lows at this point.

## 2020-09-13 NOTE — Progress Notes (Signed)
    SUBJECTIVE:   CHIEF COMPLAINT / HPI:   Frequent falls.  Patient continues to complain of dizziness.  She endorses more lightheadedness and not room spinning.  I have tried low dose meclizine and it showed no benefit.  Most recent fall was when she was rehanging her shower curtain.  She fell and struck her head with landed between the cabinet and toilet.  She was bruised and not broken.  With some struggle, she got up on her own.  Her other falls have been well controled.  She has been able to lower herself to the ground without injury.    She is on blood thinner for a fib  Hypertension with wide pulse pressure and low diastolics has been an issue.  She brings in an extensive home BP log.  That long shows the majority of her readings OK and still has both high and low readings.  Lives alone and family is very supportive.  She knows that living alone is a medical risk - a risk she is willing to take to maintain her independence.  Family lives right next door and is immediately available.  Daughter-in-law accompanies her on this visit.     OBJECTIVE:   BP (!) 162/68   Pulse (!) 58   Ht 5' (1.524 m)   Wt 128 lb 9.6 oz (58.3 kg)   SpO2 95%   BMI 25.12 kg/m   BP today is a good example of wide pulse pressure. Lungs clear Cardiac RRR without m or g Ext no edema Neuro mentation, gait, motor, sensory and affect all normal.  ASSESSMENT/PLAN:   Hypertension No change.  We have a reasoable balance between highs and lows at this point.    A-fib [facility]) Discussed with patient and daughter in law the risk of fall and anticoag.  In that this was the first fall with head trauma, they wish to continue anticoag for stroke prevention.  Frequent falls Has PT.  Stop meclizine due to risk.  Do not put herself in vulnerable position.  No work above eye level (such as hanging the shower curtain.)  Also long discussion about CNS drugs which might increase fall risk.  Those drugs are adding to  her quality of life (Xanax for sleep, tramadol and gabapentin for pain.)  Added these to the AVS.  This is another risk benefit balance we are trying to strike.     [name], MD [place]

## 2020-09-13 NOTE — Assessment & Plan Note (Signed)
Discussed with patient and daughter in law the risk of fall and anticoag.  In that this was the first fall with head trauma, they wish to continue anticoag for stroke prevention.

## 2020-09-25 ENCOUNTER — Other Ambulatory Visit: Payer: Self-pay

## 2020-09-25 ENCOUNTER — Encounter: Payer: Self-pay | Admitting: Cardiovascular Disease

## 2020-09-25 ENCOUNTER — Ambulatory Visit: Payer: HMO | Admitting: Cardiovascular Disease

## 2020-09-25 VITALS — BP 181/80 | HR 59 | Ht 61.0 in | Wt 128.0 lb

## 2020-09-25 DIAGNOSIS — I2721 Secondary pulmonary arterial hypertension: Secondary | ICD-10-CM | POA: Diagnosis not present

## 2020-09-25 DIAGNOSIS — I951 Orthostatic hypotension: Secondary | ICD-10-CM

## 2020-09-25 DIAGNOSIS — I25728 Atherosclerosis of autologous artery coronary artery bypass graft(s) with other forms of angina pectoris: Secondary | ICD-10-CM | POA: Diagnosis not present

## 2020-09-25 DIAGNOSIS — I5032 Chronic diastolic (congestive) heart failure: Secondary | ICD-10-CM

## 2020-09-25 DIAGNOSIS — E7800 Pure hypercholesterolemia, unspecified: Secondary | ICD-10-CM

## 2020-09-25 DIAGNOSIS — I480 Paroxysmal atrial fibrillation: Secondary | ICD-10-CM | POA: Diagnosis not present

## 2020-09-25 DIAGNOSIS — Z79899 Other long term (current) drug therapy: Secondary | ICD-10-CM

## 2020-09-25 DIAGNOSIS — Z5181 Encounter for therapeutic drug level monitoring: Secondary | ICD-10-CM

## 2020-09-25 DIAGNOSIS — Z7901 Long term (current) use of anticoagulants: Secondary | ICD-10-CM | POA: Diagnosis not present

## 2020-09-25 DIAGNOSIS — N184 Chronic kidney disease, stage 4 (severe): Secondary | ICD-10-CM | POA: Diagnosis not present

## 2020-09-25 NOTE — Progress Notes (Signed)
Cardiology Office Note    Date:  09/27/2020   ID:  [name], [name] [birth year], MRN [id number]  PCP:  [name], MD  Cardiologist:  Amond Speranza Electrophysiologist:  None   Evaluation Performed:  Follow-Up Visit  Chief Complaint:  F/U AFib  History of Present Illness:    Jacqueline [name] is a 87 y.o. female with coronary artery disease and previous bypass surgery (2006), paroxysmal atrial fibrillation requiring previous cardioversion, diastolic heart failure, HTN.  She is accompanied by her son, Jacqueline [name].  She has had a relatively uneventful few months from a medical point of view.  She has not had any episodes of syncope, but did have a mechanical fall about 2 weeks ago.  She did hit her head against the commode.  She did not seek medical attention.  There was no overt bleeding and she has not had headaches or any focal neurological complaints.  Otherwise she denies angina or dyspnea at rest or with activity, dizziness, edema, intermittent claudication, palpitations.  Her blood pressure remains quite volatile.  She is keeping a detailed log of her blood pressure checked at least once a day and the range is 98/45-176/86.  The typical systolic blood pressure however is in the 120-1 140s.  She has a prescription for midodrine which she takes only if her systolic blood pressure is less than 100 and a prescription for hydralazine which she only takes if her systolic blood pressure is over 160.  She has not taken either one of these medications with high frequency.  Her heart rate remains usually low, typically in the 50s.  She had a protracted episode of persistent atrial fibrillation in summer 2020 on a low-dose of amiodarone.  The dose of amiodarone was increased back to 200 mg daily but before she could undergo cardioversion she presented with rectal bleeding due to hemorrhoids and diverticulosis which required an interruption in her anticoagulants.  She eventually underwent TEE  guided cardioversion on July 6.  She had transient atrial fibrillation during urinary tract infection in October 2021.    Past Medical History:  Diagnosis Date  . Arthritis    back  . Atrial fibrillation with rapid ventricular response ([place]) 02/2014   a. CHA2DS2VASc = 6 -> on eliquis;  b. 02/2014 s/p DCCV;  c. 08/2014 Echo: EF 55-60%, Gr 2 DD, mild MR, triv AI.  Marland Kitchen CAD (coronary artery disease)    a. s/p CABG x 2 (LIMA->LAD, VG->Diag);  b. 08/2014 Cath: LM nl, LAD 90p, LCX nl, RCA nl/dominant, LIMA->LAD atretic, VG->D2 patent w/ retrograde filling of LAD, EF 55-60%-->Med Rx.  . Diverticulitis 02/21/2012  . GERD (gastroesophageal reflux disease)   . Hyperlipemia   . Hypertension   . Insomnia    Past Surgical History:  Procedure Laterality Date  . ABDOMINAL HYSTERECTOMY  1975  . CARDIAC CATHETERIZATION N/A 05/09/2015   Procedure: Right Heart Cath;  Surgeon: Larey Dresser, MD;  Location: [facility];  Service: Cardiovascular;  Laterality: N/A;  . CARDIOVERSION N/A 02/28/2014   Procedure: CARDIOVERSION;  Surgeon: Sanda Klein, MD;  Location: [facility]OS[facility];  Service: Cardiovascular;  Laterality: N/A;  . CARDIOVERSION N/A 01/30/2019   Procedure: CARDIOVERSION;  Surgeon: Fay Records, MD;  Location: [facility] ENDOSCOPY;  Service: Cardiovascular;  Laterality: N/A;  . CATARACT EXTRACTION Bilateral 2014   with lens implanted  . COLONOSCOPY N/A 02/19/2015   Procedure: COLONOSCOPY;  Surgeon: Ladene Artist, MD;  Location: [facility] ENDOSCOPY;  Service: Endoscopy;  Laterality: N/A;  .  CORONARY ARTERY BYPASS GRAFT  2006   off-pump bypass surgery with LIMA to the LAD and SVG to the second diagonal artery ( performed by Dr Servando Snare)   . JOINT REPLACEMENT Bilateral    L-2004, R-2006  . LEFT HEART CATHETERIZATION WITH CORONARY/GRAFT ANGIOGRAM N/A 09/17/2014   Procedure: LEFT HEART CATHETERIZATION WITH Beatrix Fetters;  Surgeon: Troy Sine, MD;  Location: [facility] CATH LAB;  Service: Cardiovascular;   Laterality: N/A;  . REIMPLANTATION OF TOTAL KNEE Right 10/08/2014   Procedure: REIMPLANTATION OF RIGHT TOTAL KNEE ARTHROPLASTY WITH REMOVAL OF ANTIBIOTIC SPACER;  Surgeon: Paralee Cancel, MD;  Location: [facility];  Service: Orthopedics;  Laterality: Right;  . ROTATOR CUFF REPAIR Bilateral    r-1999, l- 2005  . TEE WITHOUT CARDIOVERSION N/A 02/28/2014   Procedure: TRANSESOPHAGEAL ECHOCARDIOGRAM (TEE);  Surgeon: Sanda Klein, MD;  Location: [place];  Service: Cardiovascular;  Laterality: N/A;  . TEE WITHOUT CARDIOVERSION N/A 01/30/2019   Procedure: TRANSESOPHAGEAL ECHOCARDIOGRAM (TEE);  Surgeon: Fay Records, MD;  Location: [place];  Service: Cardiovascular;  Laterality: N/A;  . TOTAL KNEE ARTHROPLASTY Right 07/02/2014   Procedure: Resection of Infected Right Total Knee Arthroplasty with placement antibiotic spacer;  Surgeon: Mauri Pole, MD;  Location: [facility] OR[facility];  Service: Orthopedics;  Laterality: Right;     Current Meds  Medication Sig  . acetaminophen (TYLENOL) 500 MG tablet Take 500 mg by mouth every 6 (six) hours as needed for moderate pain (pain/headache.).   Marland Kitchen ALPRAZolam (XANAX) 0.5 MG tablet Take 1 tablet (0.5 mg total) by mouth 2 (two) times daily as needed for anxiety.  Marland Kitchen amiodarone (PACERONE) 200 MG tablet Take 1 tablet (200 mg total) by mouth daily.  Marland Kitchen apixaban (ELIQUIS) 2.5 MG TABS tablet Take 1 tablet (2.5 mg total) by mouth 2 (two) times daily.  . cholecalciferol (VITAMIN D3) 25 MCG (1000 UNIT) tablet Take 1,000 Units by mouth daily.  . cycloSPORINE (RESTASIS) 0.05 % ophthalmic emulsion Place 1 drop into both eyes 2 (two) times daily.  Marland Kitchen gabapentin (NEURONTIN) 100 MG capsule TAKE 1 CAPSULE (100 MG TOTAL) BY MOUTH 3 (THREE) TIMES DAILY.  . hydrALAZINE (APRESOLINE) 25 MG tablet TAKE ONE TABLET FOR SBP ABOVE 160 AS NEEDED UP TO 3 TABLET PER DAY.  Marland Kitchen levothyroxine (SYNTHROID) 75 MCG tablet TAKE 1 TABLET BY MOUTH EVERY DAY  . midodrine (PROAMATINE) 2.5 MG tablet TAKE 1 TABLET BY  MOUTH IN MORNING AND 1 TAB 6 HOURS LATER, DO NOT LAY FLAT FOR 4 HOURS AFTER TAKING THE MEDICATION. DO NOT TAKE FOR A SYSTOLIC OF 161 OF ABOVE.  . Multiple Vitamin (MULTIVITAMIN WITH MINERALS) TABS tablet Take 1 tablet by mouth daily.  . nitrofurantoin (MACRODANTIN) 50 MG capsule Take 1 capsule (50 mg total) by mouth 4 (four) times daily.  . nitroGLYCERIN (NITROSTAT) 0.4 MG SL tablet PLACE 1 TABLET UNDER THE TONGUE EVERY 5 MINUTES AS NEEDED FOR CHEST PAIN UP TO 3 DOSES (Patient taking differently: Place 0.4 mg under the tongue every 5 (five) minutes x 3 doses as needed for chest pain. PLACE 1 TABLET UNDER THE TONGUE EVERY 5 MINUTES AS NEEDED FOR CHEST PAIN UP TO 3 DOSES)  . Omega-3 Fatty Acids (FISH OIL) 1000 MG CAPS Take 1,000 mg by mouth 2 (two) times a day.   Marland Kitchen omeprazole (PRILOSEC) 20 MG capsule TAKE 1 CAPSULE BY MOUTH EVERY DAY  . Polyethyl Glycol-Propyl Glycol (LUBRICANT EYE DROPS) 0.4-0.3 % SOLN Place 1-2 drops into both eyes 3 (three) times daily as needed (dry/irritated eyes.).  Marland Kitchen  polyethylene glycol powder ([employer] PURELAX) 17 GM/SCOOP powder MIX 17 G AND TAKE BY MOUTH DAILY. (Patient taking differently: Take 1 Container by mouth 3 (three) times a week.)  . pravastatin (PRAVACHOL) 40 MG tablet Take 1 tablet (40 mg total) by mouth every evening.  Marland Kitchen rOPINIRole (REQUIP) 0.25 MG tablet TAKE 1 TABLET BY MOUTH THREE TIMES A DAY  . traMADol (ULTRAM) 50 MG tablet TAKE 1 TABLET BY MOUTH EVERY 12 HOURS AS NEEDED FOR PAIN (Patient taking differently: Take 50 mg by mouth every 12 (twelve) hours as needed for moderate pain or severe pain.)  . traZODone (DESYREL) 100 MG tablet TAKE 1 TABLET BY MOUTH EVERYDAY AT BEDTIME (Patient taking differently: Take 100 mg by mouth at bedtime.)     Allergies:   Atorvastatin, Beta adrenergic blockers, Crestor [rosuvastatin], Morphine and related, and Prednisone   Social History   Tobacco Use  . Smoking status: Never Smoker  . Smokeless tobacco: Never Used  Vaping Use   . Vaping Use: Never used  Substance Use Topics  . Alcohol use: No  . Drug use: No     Family Hx: The patient's family history includes Arthritis in her brother; Cancer in her brother; Heart attack in her brother; Parkinson's disease in her mother. There is no history of Breast cancer.  ROS:   Please see the history of present illness.    All other systems are reviewed and are negative.  Prior CV studies:   The following studies were reviewed today:  Notes from visit with Dr. Andria Frames on 06/10/2020  Echocardiogram 04/23/2020   1. Left ventricular ejection fraction, by estimation, is 55 to 60%. The left ventricle has normal function. The left ventricle has no regional wall motion abnormalities. Left ventricular diastolic parameters are consistent with Grade III diastolic dysfunction (restrictive). Elevated left atrial pressure.   2. Right ventricular systolic function is normal. The right ventricular size is normal. There is mildly elevated pulmonary artery systolic pressure.   3. Left atrial size was moderately dilated.   4. The mitral valve is normal in structure. Mild mitral valve regurgitation. No evidence of mitral stenosis.   5. The aortic valve is tricuspid. Aortic valve regurgitation is trivial. Mild aortic valve sclerosis is present, with no evidence of aortic valve stenosis.   6. The inferior vena cava is normal in size with greater than 50% respiratory variability, suggesting right atrial pressure of 3 mmHg.  Addendum: On my review the diastolic function does not really qualify for restrictive filling, but rather for pseudonormal filling. [facility])     Labs/Other Tests and Data Reviewed:    EKG: Ordered today, personally reviewed.  Shows sinus bradycardia at 57 bpm, otherwise normal tracing.  QTc 406 ms.  Recent Labs: 05/25/2020: B Natriuretic Peptide 284.2; TSH 1.933 05/26/2020: ALT 21; Magnesium 2.0 06/10/2020: BUN 16; Creatinine, Ser 1.59; Hemoglobin 11.8; Platelets  164; Potassium 4.7; Sodium 142   Recent Lipid Panel Lab Results  Component Value Date/Time   CHOL 135 12/13/2019 11:02 AM   TRIG 116 12/13/2019 11:02 AM   HDL 62 12/13/2019 11:02 AM   CHOLHDL 2.2 12/13/2019 11:02 AM   CHOLHDL 2.8 04/08/2016 11:53 AM   LDLCALC 52 12/13/2019 11:02 AM    Wt Readings from Last 3 Encounters:  09/25/20 128 lb (58.1 kg)  09/12/20 128 lb 9.6 oz (58.3 kg)  06/13/20 131 lb (59.4 kg)     Objective:    Vital Signs:  BP (!) 181/80   Pulse (!) 59  Ht 5\' 1"  (1.549 m)   Wt 128 lb (58.1 kg)   SpO2 97%   BMI 24.19 kg/m      General: Alert, oriented x3, no distress, elderly, appears a little more frail Head: no evidence of trauma, PERRL, EOMI, no exophtalmos or lid lag, no myxedema, no xanthelasma; normal ears, nose and oropharynx Neck: normal jugular venous pulsations and no hepatojugular reflux; brisk carotid pulses without delay and no carotid bruits Chest: clear to auscultation, no signs of consolidation by percussion or palpation, normal fremitus, symmetrical and full respiratory excursions Cardiovascular: normal position and quality of the apical impulse, regular rhythm, normal first and second heart sounds, no murmurs, rubs or gallops Abdomen: no tenderness or distention, no masses by palpation, no abnormal pulsatility or arterial bruits, normal bowel sounds, no hepatosplenomegaly Extremities: no clubbing, cyanosis or edema; 2+ radial, ulnar and brachial pulses bilaterally; 2+ right femoral, posterior tibial and dorsalis pedis pulses; 2+ left femoral, posterior tibial and dorsalis pedis pulses; no subclavian or femoral bruits Neurological: grossly nonfocal Psych: Normal mood and affect    ASSESSMENT & PLAN:    1. Paroxysmal atrial fibrillation (HCC)   2. Encounter for monitoring amiodarone therapy   3. Long term (current) use of anticoagulants   4. Coronary artery disease of autologous bypass graft with stable angina pectoris ([place])   5. Chronic  diastolic CHF (congestive heart failure) ([place])   6. PAH (pulmonary artery hypertension) ([place])   7. CKD (chronic kidney disease) stage 4, GFR 15-29 ml/min (HCC)   8. Hypercholesterolemia   9. Orthostatic hypotension       AFib/flutter:  No clinical recurrence since her last appointment.  Prolonged symptomatic episode in 2020 coinciding with GI bleeding, recurrent symptomatic episode of urinary tract infection in 2021.  Maintaining sinus rhythm on amiodarone.   CHADSVasc 6 (age 8, gender, CAD, CHF, HTN).  On anticoagulation.  Amiodarone:  She developed significant bradycardia when taking amiodarone 400 mg daily, but on the current dose of amiodarone she did not have significant pauses or severe bradycardia.  Due to have repeat liver function tests and TSH in April.  Eliquis: Dose adjusted for advanced age and reduced renal function, most recent hemoglobin 11.8.  CAD:  All her antianginal medications were stopped due to her problems with orthostatic hypotension and falls.  She is angina free at this time.  CHF:  Clinically euvolemic.  Sedentary but appears to have good functional status.  Medical therapy limited by her tendency to have hypotension and falls.  PAH:  Most likely secondary to left heart diastolic failure.  CKD: Most recent creatinine 1.59,  stable with a baseline around 1.5-1.7 (GFR 25-30).  HLP: On pravastatin rather than simvastatin due to risk of interaction with amiodarone.  Will have repeat lipid profile when she has her other labs in April.  Orthostatic hypotension/HTN:  Had a fall 2 weeks ago, although she does not recall feeling dizzy or presyncopal.  Blood pressure levels remain quite volatile.  Using midodrine only as needed and hydralazine infrequently.  Tolerate systolic blood pressures in the 160s.  Little higher today, but she is always nervous when she comes to the doctor.  Has had problems with symptomatic hypotension with orthostasis and low diastolic blood  pressures.  She is off all antihypertensive and antianginal medications and is not on diuretics.  Encourage compression stockings, may need an abdominal binder.  If she uses midodrine she should not lay flat for at least 4 hours.  Avoid fludrocortisone since this  would precipitate heart failure  UTI: Frequent recurrence, currently asymptomatic.    Patient Instructions  Medication Instructions:  No changes  Eliquis 2.5 samples given: 4 boxes, Lot number [id number] EXP: 4/23  *If you need a refill on your cardiac medications before your next appointment, please call your pharmacy*   Lab Work: Your provider would like for you to return in 2 months to have the following labs drawn: CMET, TSH and fasting Lipid. You do not need an appointment for the lab. Once in our office lobby there is a podium where you can sign in and ring the doorbell to alert [place] that you are here. The lab is open from 8:00 am to 4:30 pm; closed for lunch from 12:45pm-1:45pm.  If you have labs (blood work) drawn today and your tests are completely normal, you will receive your results only by: Marland Kitchen MyChart Message (if you have MyChart) OR . A paper copy in the mail If you have any lab test that is abnormal or we need to change your treatment, we will call you to review the results.   Testing/Procedures: None ordered   Follow-Up: At [facility], you and your health needs are our priority.  As part of our continuing mission to provide you with exceptional heart care, we have created designated Provider Care Teams.  These Care Teams include your primary Cardiologist (physician) and Advanced Practice Providers (APPs -  Physician Assistants and Nurse Practitioners) who all work together to provide you with the care you need, when you need it.  We recommend signing up for the patient portal called "MyChart".  Sign up information is provided on this After Visit Summary.  MyChart is used to connect with patients for Virtual  Visits (Telemedicine).  Patients are able to view lab/test results, encounter notes, upcoming appointments, etc.  Non-urgent messages can be sent to your provider as well.   To learn more about what you can do with MyChart, go to [email or web address].    Your next appointment:   6 month(s)  The format for your next appointment:   In Person  Provider:   You may see Sanda Klein, MD or one of the following Advanced Practice Providers on your designated Care Team:    Almyra Deforest, PA-C  Fabian Sharp, [place] or   Roby Lofts, PA-C        Signed, Sanda Klein, MD  09/27/2020 10:49 AM    [place]

## 2020-09-25 NOTE — Patient Instructions (Addendum)
Medication Instructions:  No changes  Eliquis 2.5 samples given: 4 boxes, Lot number [id number] EXP: 4/23  *If you need a refill on your cardiac medications before your next appointment, please call your pharmacy*   Lab Work: Your provider would like for you to return in 2 months to have the following labs drawn: CMET, TSH and fasting Lipid. You do not need an appointment for the lab. Once in our office lobby there is a podium where you can sign in and ring the doorbell to alert [place] that you are here. The lab is open from 8:00 am to 4:30 pm; closed for lunch from 12:45pm-1:45pm.  If you have labs (blood work) drawn today and your tests are completely normal, you will receive your results only by: Marland Kitchen MyChart Message (if you have MyChart) OR . A paper copy in the mail If you have any lab test that is abnormal or we need to change your treatment, we will call you to review the results.   Testing/Procedures: None ordered   Follow-Up: At [facility], you and your health needs are our priority.  As part of our continuing mission to provide you with exceptional heart care, we have created designated Provider Care Teams.  These Care Teams include your primary Cardiologist (physician) and Advanced Practice Providers (APPs -  Physician Assistants and Nurse Practitioners) who all work together to provide you with the care you need, when you need it.  We recommend signing up for the patient portal called "MyChart".  Sign up information is provided on this After Visit Summary.  MyChart is used to connect with patients for Virtual Visits (Telemedicine).  Patients are able to view lab/test results, encounter notes, upcoming appointments, etc.  Non-urgent messages can be sent to your provider as well.   To learn more about what you can do with MyChart, go to [email or web address].    Your next appointment:   6 month(s)  The format for your next appointment:   In Person  Provider:   You may see  Sanda Klein, MD or one of the following Advanced Practice Providers on your designated Care Team:    Almyra Deforest, PA-C  Fabian Sharp, PA-C or   Roby Lofts, [place]

## 2020-09-27 ENCOUNTER — Encounter: Payer: Self-pay | Admitting: Cardiovascular Disease

## 2020-10-17 ENCOUNTER — Encounter: Payer: Self-pay | Admitting: Family Medicine

## 2020-10-17 ENCOUNTER — Ambulatory Visit (INDEPENDENT_AMBULATORY_CARE_PROVIDER_SITE_OTHER): Payer: HMO | Admitting: Family Medicine

## 2020-10-17 ENCOUNTER — Other Ambulatory Visit: Payer: Self-pay

## 2020-10-17 DIAGNOSIS — I10 Essential (primary) hypertension: Secondary | ICD-10-CM | POA: Diagnosis not present

## 2020-10-17 DIAGNOSIS — N1832 Chronic kidney disease, stage 3b: Secondary | ICD-10-CM | POA: Diagnosis not present

## 2020-10-17 DIAGNOSIS — E785 Hyperlipidemia, unspecified: Secondary | ICD-10-CM | POA: Diagnosis not present

## 2020-10-17 DIAGNOSIS — I480 Paroxysmal atrial fibrillation: Secondary | ICD-10-CM

## 2020-10-17 DIAGNOSIS — I2510 Atherosclerotic heart disease of native coronary artery without angina pectoris: Secondary | ICD-10-CM | POA: Diagnosis not present

## 2020-10-17 DIAGNOSIS — I120 Hypertensive chronic kidney disease with stage 5 chronic kidney disease or end stage renal disease: Secondary | ICD-10-CM | POA: Diagnosis not present

## 2020-10-17 DIAGNOSIS — I5032 Chronic diastolic (congestive) heart failure: Secondary | ICD-10-CM

## 2020-10-17 DIAGNOSIS — N185 Chronic kidney disease, stage 5: Secondary | ICD-10-CM | POA: Diagnosis not present

## 2020-10-17 DIAGNOSIS — I129 Hypertensive chronic kidney disease with stage 1 through stage 4 chronic kidney disease, or unspecified chronic kidney disease: Secondary | ICD-10-CM | POA: Diagnosis not present

## 2020-10-17 DIAGNOSIS — Z0000 Encounter for general adult medical examination without abnormal findings: Secondary | ICD-10-CM | POA: Diagnosis not present

## 2020-10-17 DIAGNOSIS — I208 Other forms of angina pectoris: Secondary | ICD-10-CM

## 2020-10-17 DIAGNOSIS — I2721 Secondary pulmonary arterial hypertension: Secondary | ICD-10-CM | POA: Diagnosis not present

## 2020-10-17 DIAGNOSIS — N183 Chronic kidney disease, stage 3 unspecified: Secondary | ICD-10-CM | POA: Insufficient documentation

## 2020-10-17 NOTE — Patient Instructions (Addendum)
You are up to date on immunizations except for tetanus.  Next time you have a cut or scrape, come in and we'll give you one. I will call with blood test results and make sure I send a copy to Dr. Loletha Grayer.   No changes You know how to get in touch with me I ought to see you at least every 6 months.  Maybe every three months since you have so many medical  Problems.

## 2020-10-18 ENCOUNTER — Encounter: Payer: Self-pay | Admitting: Family Medicine

## 2020-10-18 LAB — CBC
Hematocrit: 35.4 % (ref 34.0–46.6)
Hemoglobin: 11.6 g/dL (ref 11.1–15.9)
MCH: 31.8 pg (ref 26.6–33.0)
MCHC: 32.8 g/dL (ref 31.5–35.7)
MCV: 97 fL (ref 79–97)
Platelets: 169 10*3/uL (ref 150–450)
RBC: 3.65 x10E6/uL — ABNORMAL LOW (ref 3.77–5.28)
RDW: 12.3 % (ref 11.7–15.4)
WBC: 3.7 10*3/uL (ref 3.4–10.8)

## 2020-10-18 LAB — CMP14+EGFR
ALT: 19 IU/L (ref 0–32)
AST: 29 IU/L (ref 0–40)
Albumin/Globulin Ratio: 2 (ref 1.2–2.2)
Albumin: 4.5 g/dL (ref 3.6–4.6)
Alkaline Phosphatase: 79 IU/L (ref 44–121)
BUN/Creatinine Ratio: 11 — ABNORMAL LOW (ref 12–28)
BUN: 16 mg/dL (ref 8–27)
Bilirubin Total: 0.4 mg/dL (ref 0.0–1.2)
CO2: 21 mmol/L (ref 20–29)
Calcium: 9.6 mg/dL (ref 8.7–10.3)
Chloride: 103 mmol/L (ref 96–106)
Creatinine, Ser: 1.46 mg/dL — ABNORMAL HIGH (ref 0.57–1.00)
Globulin, Total: 2.3 g/dL (ref 1.5–4.5)
Glucose: 83 mg/dL (ref 65–99)
Potassium: 4.8 mmol/L (ref 3.5–5.2)
Sodium: 140 mmol/L (ref 134–144)
Total Protein: 6.8 g/dL (ref 6.0–8.5)
eGFR: 35 mL/min/{1.73_m2} — ABNORMAL LOW (ref >59)

## 2020-10-18 LAB — LIPID PANEL
Chol/HDL Ratio: 2.6 ratio (ref 0.0–4.4)
Cholesterol, Total: 150 mg/dL (ref 100–199)
HDL: 57 mg/dL (ref >39)
LDL Chol Calc (NIH): 74 mg/dL (ref 0–99)
Triglycerides: 106 mg/dL (ref 0–149)
VLDL Cholesterol Cal: 19 mg/dL (ref 5–40)

## 2020-10-18 LAB — TSH: TSH: 1.42 u[IU]/mL (ref 0.450–4.500)

## 2020-10-18 NOTE — Assessment & Plan Note (Signed)
BP controled.  Current labs suggest CKD 3b

## 2020-10-18 NOTE — Assessment & Plan Note (Signed)
Seems to be in the sweet spot for treatment.

## 2020-10-18 NOTE — Progress Notes (Signed)
Thanks for the update [facility]

## 2020-10-18 NOTE — Assessment & Plan Note (Signed)
Lipid panel drawn.  Results show lipids at goal.

## 2020-10-18 NOTE — Progress Notes (Signed)
    SUBJECTIVE:   CHIEF COMPLAINT / HPI:   Annual Preventive exam. Acute Problems: None.  "I am doing well." Chronic problems: 1. HBP with wide pulse pressures.  We have been walking the tightrope between high systolics and low diastolics.  No syncope, chest pain or SOB.  BP noted to be up today.  However, she brings in multiple home BP readings with a fairly equal balance between highs and lows. 2. PAF and tachy brady syndrome.  No palpitations.  Home pulse checks all good with no tachy or bradycardia.  On eliquis.  No bleedings.   3. Hypothyroid on levothyroxine.  No wt changes or heat/cold intolerance 4. CAD, S/P CABG.  On statin and apixiban (not ASA) No chest pain or DOE.  Due for lipid check (secondary prevention.) 5. Diastolic CHF and pulmonary hypertension.  No DOE or O2 requirement.  No leg swelling.  Currently off all diuretics. Health Maint COVID immunized and boosted.  Up to date on all measures except tetanus.  Does not desire further breast or colon cancer screening.    PERTINENT  PMH / PSH: Lives alone but family very close by and attentive to her needs.  She has had slow and steady weight loss with aging.  Nothing dramatic.  She is aware that I want her to maintain her current weight.    OBJECTIVE:   BP (!) 162/84   Pulse 62   Ht 5\' 1"  (1.549 m)   Wt 126 lb (57.2 kg)   SpO2 94%   BMI 23.81 kg/m   HEENT WNL Neck supple without masses or thyromegally Lungs clear Cardiac RRR without m or g Abd benign Ext WNL Neuro, motor, sensory, gait, cognition and affect all grossly normal.  ASSESSMENT/PLAN:   Hypertensive kidney disease with CKD stage III (HCC) BP controled.  Current labs suggest CKD 3b  CAD (coronary artery disease) Denies Chest pain.  Lipids at goal for secondary prvention.  Preventative health care Up to date on all prevent measures except tetanus.  Will come back when cut or scrape.  Dyslipidemia Lipid panel drawn.  Results show lipids at  goal.  Hypertension Seems to be in the sweet spot for treatment.  Chronic diastolic CHF (congestive heart failure) (HCC) Well maintained on current meds.  Pulmonary arterial hypertension ([place]) Well maintained on current meds.     Jacqueline [name] Resides, MD [place]

## 2020-10-18 NOTE — Assessment & Plan Note (Signed)
Well maintained on current meds.

## 2020-10-18 NOTE — Assessment & Plan Note (Signed)
Up to date on all prevent measures except tetanus.  Will come back when cut or scrape.

## 2020-10-18 NOTE — Assessment & Plan Note (Addendum)
Denies Chest pain.  Lipids at goal for secondary prvention.

## 2020-10-24 ENCOUNTER — Other Ambulatory Visit: Payer: Self-pay | Admitting: Family Medicine

## 2020-11-27 DIAGNOSIS — H04123 Dry eye syndrome of bilateral lacrimal glands: Secondary | ICD-10-CM | POA: Diagnosis not present

## 2020-11-27 DIAGNOSIS — Z961 Presence of intraocular lens: Secondary | ICD-10-CM | POA: Diagnosis not present

## 2020-11-27 DIAGNOSIS — H16223 Keratoconjunctivitis sicca, not specified as Sjogren's, bilateral: Secondary | ICD-10-CM | POA: Diagnosis not present

## 2020-12-12 ENCOUNTER — Other Ambulatory Visit: Payer: Self-pay | Admitting: Family Medicine

## 2020-12-12 DIAGNOSIS — K219 Gastro-esophageal reflux disease without esophagitis: Secondary | ICD-10-CM

## 2020-12-12 DIAGNOSIS — F4329 Adjustment disorder with other symptoms: Secondary | ICD-10-CM

## 2020-12-23 ENCOUNTER — Other Ambulatory Visit: Payer: Self-pay | Admitting: Cardiovascular Disease

## 2020-12-23 ENCOUNTER — Other Ambulatory Visit: Payer: Self-pay | Admitting: Family Medicine

## 2021-01-21 ENCOUNTER — Telehealth: Payer: Self-pay | Admitting: Cardiovascular Disease

## 2021-01-21 NOTE — Telephone Encounter (Signed)
The patient has been made aware that the assistance forms will be signed and faxed when the provider is back in the office.  Medication Samples have been provided to the patient.  Drug name: Eliquis       Strength: 2.5 mg        Qty: 3 boxes  LOT: [id number]  Exp.Date: 6/23  The patient has been instructed regarding the correct time, dose, and frequency of taking this medication, including desired effects and most common side effects.

## 2021-01-21 NOTE — Telephone Encounter (Signed)
New message:      Patient calling stating that a form need to be fax to describing the medication. [facility] did not have the doctors information on it. Please advise.   Patient calling the office for samples of medication:   1.  What medication and dosage are you requesting samples for? Eliquis   2.  Are you currently out of this medication? Yes     Patient is out

## 2021-01-24 ENCOUNTER — Telehealth: Payer: Self-pay | Admitting: *Deleted

## 2021-01-24 NOTE — Telephone Encounter (Signed)
Eliquis assistance has been faxed to [place] ?

## 2021-01-28 ENCOUNTER — Telehealth: Payer: Self-pay

## 2021-01-28 DIAGNOSIS — R42 Dizziness and giddiness: Secondary | ICD-10-CM

## 2021-01-28 NOTE — Telephone Encounter (Signed)
Patient calls nurse line requesting medication for vertigo. Patient is requesting meclizine, however, medication was discontinued and is no longer on medication list. Patient reports that she has had worsening of symptoms since Sunday.   Denies recent falls. Advised to change positions slowly and avoid stairs, ladders, etc. PCP does not have availability until the end of the month. Patient declines scheduling with another provider at this time. ED precautions given.   Please advise.   [name], RN

## 2021-01-29 MED ORDER — MECLIZINE HCL 12.5 MG PO TABS: 12.5000 mg | ORAL_TABLET | Freq: Three times a day (TID) | ORAL | 3 refills | Status: DC | PRN

## 2021-01-29 NOTE — Telephone Encounter (Signed)
Called patient.  Room spinning dizzy x 3 days.  Has used low dose meclizine in the past and it helped.  I am aware that meclizine is a Beers drug due to fall risk.  I think the fall risk is higher with untreated vertigo.  Rx sent.  Also urged patient to get a COVID booster.

## 2021-01-30 NOTE — Telephone Encounter (Signed)
Assistance has been approved through 07/26/21

## 2021-02-08 ENCOUNTER — Encounter (HOSPITAL_COMMUNITY): Payer: Self-pay | Admitting: Emergency Medicine

## 2021-02-08 ENCOUNTER — Emergency Department (HOSPITAL_COMMUNITY)
Admission: EM | Admit: 2021-02-08 | Discharge: 2021-02-09 | Disposition: A | Discharge status: Home / Self Care | Payer: HMO | Attending: Emergency Medicine | Admitting: Emergency Medicine

## 2021-02-08 ENCOUNTER — Other Ambulatory Visit: Payer: Self-pay

## 2021-02-08 DIAGNOSIS — E039 Hypothyroidism, unspecified: Secondary | ICD-10-CM | POA: Diagnosis not present

## 2021-02-08 DIAGNOSIS — Z951 Presence of aortocoronary bypass graft: Secondary | ICD-10-CM | POA: Diagnosis not present

## 2021-02-08 DIAGNOSIS — Z7901 Long term (current) use of anticoagulants: Secondary | ICD-10-CM | POA: Diagnosis not present

## 2021-02-08 DIAGNOSIS — I5032 Chronic diastolic (congestive) heart failure: Secondary | ICD-10-CM | POA: Diagnosis not present

## 2021-02-08 DIAGNOSIS — I2510 Atherosclerotic heart disease of native coronary artery without angina pectoris: Secondary | ICD-10-CM | POA: Diagnosis not present

## 2021-02-08 DIAGNOSIS — Z96651 Presence of right artificial knee joint: Secondary | ICD-10-CM | POA: Insufficient documentation

## 2021-02-08 DIAGNOSIS — I130 Hypertensive heart and chronic kidney disease with heart failure and stage 1 through stage 4 chronic kidney disease, or unspecified chronic kidney disease: Secondary | ICD-10-CM | POA: Diagnosis not present

## 2021-02-08 DIAGNOSIS — I160 Hypertensive urgency: Secondary | ICD-10-CM | POA: Diagnosis not present

## 2021-02-08 DIAGNOSIS — N183 Chronic kidney disease, stage 3 unspecified: Secondary | ICD-10-CM | POA: Insufficient documentation

## 2021-02-08 DIAGNOSIS — I10 Essential (primary) hypertension: Secondary | ICD-10-CM | POA: Diagnosis not present

## 2021-02-08 DIAGNOSIS — I4891 Unspecified atrial fibrillation: Secondary | ICD-10-CM | POA: Diagnosis not present

## 2021-02-08 DIAGNOSIS — Z79899 Other long term (current) drug therapy: Secondary | ICD-10-CM | POA: Diagnosis not present

## 2021-02-08 DIAGNOSIS — R42 Dizziness and giddiness: Secondary | ICD-10-CM | POA: Diagnosis not present

## 2021-02-08 LAB — COMPREHENSIVE METABOLIC PANEL
ALT: 22 U/L (ref 0–44)
AST: 29 U/L (ref 15–41)
Albumin: 4 g/dL (ref 3.5–5.0)
Alkaline Phosphatase: 64 U/L (ref 38–126)
Anion gap: 7 (ref 5–15)
BUN: 18 mg/dL (ref 8–23)
CO2: 25 mmol/L (ref 22–32)
Calcium: 9.6 mg/dL (ref 8.9–10.3)
Chloride: 107 mmol/L (ref 98–111)
Creatinine, Ser: 1.59 mg/dL — ABNORMAL HIGH (ref 0.44–1.00)
GFR, Estimated: 31 mL/min — ABNORMAL LOW (ref >60)
Glucose, Bld: 95 mg/dL (ref 70–99)
Potassium: 4.1 mmol/L (ref 3.5–5.1)
Sodium: 139 mmol/L (ref 135–145)
Total Bilirubin: 0.4 mg/dL (ref 0.3–1.2)
Total Protein: 6.8 g/dL (ref 6.5–8.1)

## 2021-02-08 LAB — CBC WITH DIFFERENTIAL/PLATELET
Abs Immature Granulocytes: 0.01 10*3/uL (ref 0.00–0.07)
Basophils Absolute: 0 10*3/uL (ref 0.0–0.1)
Basophils Relative: 1 %
Eosinophils Absolute: 0.3 10*3/uL (ref 0.0–0.5)
Eosinophils Relative: 6 %
HCT: 36.3 % (ref 36.0–46.0)
Hemoglobin: 11.7 g/dL — ABNORMAL LOW (ref 12.0–15.0)
Immature Granulocytes: 0 %
Lymphocytes Relative: 28 %
Lymphs Abs: 1.2 10*3/uL (ref 0.7–4.0)
MCH: 31 pg (ref 26.0–34.0)
MCHC: 32.2 g/dL (ref 30.0–36.0)
MCV: 96.3 fL (ref 80.0–100.0)
Monocytes Absolute: 0.4 10*3/uL (ref 0.1–1.0)
Monocytes Relative: 10 %
Neutro Abs: 2.3 10*3/uL (ref 1.7–7.7)
Neutrophils Relative %: 55 %
Platelets: 166 10*3/uL (ref 150–400)
RBC: 3.77 MIL/uL — ABNORMAL LOW (ref 3.87–5.11)
RDW: 14.5 % (ref 11.5–15.5)
WBC: 4.2 10*3/uL (ref 4.0–10.5)
nRBC: 0 % (ref 0.0–0.2)

## 2021-02-08 MED ORDER — CLONIDINE HCL 0.1 MG PO TABS
0.1000 mg | ORAL_TABLET | Freq: Once | ORAL | Status: AC
  Administered 2021-02-08: 0.1 mg via ORAL
  Filled 2021-02-08: qty 1

## 2021-02-08 NOTE — ED Triage Notes (Signed)
Pt reports she has checked her blood pressure several times today and it continues to rise.  She has also been treated for dizziness by her PCP.  No pain or other symptoms at this time.

## 2021-02-08 NOTE — ED Provider Notes (Signed)
[facility] EMERGENCY DEPARTMENT Provider Note   CSN: [id number] Arrival date & time: 02/08/21  2121     History Chief Complaint  Patient presents with   Hypertension        Dizziness    Jacqueline [name] is a 87 y.o. female.  Patient is an 87 year old female with past medical history of coronary artery disease with CABG, atrial fibrillation, hypertension, hyperlipidemia.  Patient presenting today for evaluation of elevated blood pressure.  She tells me she has had vertigo off and on for the past 2 weeks.  For the past 4 days, her blood pressure has been running nearly 338 systolic.  She has been given hydralazine and has taken 2 doses of this today with little improvement.  She denies any weakness or numbness.  She denies any chest pain or difficulty breathing.  The history is provided by the patient.      Past Medical History:  Diagnosis Date   Arthritis    back   Atrial fibrillation with rapid ventricular response ([place]) 02/2014   a. CHA2DS2VASc = 6 -> on eliquis;  b. 02/2014 s/p DCCV;  c. 08/2014 Echo: EF 55-60%, Gr 2 DD, mild MR, triv AI.   CAD (coronary artery disease)    a. s/p CABG x 2 (LIMA->LAD, VG->Diag);  b. 08/2014 Cath: LM nl, LAD 90p, LCX nl, RCA nl/dominant, LIMA->LAD atretic, VG->D2 patent w/ retrograde filling of LAD, EF 55-60%-->Med Rx.   Diverticulitis 02/21/2012   GERD (gastroesophageal reflux disease)    Hyperlipemia    Hypertension    Insomnia     Patient Active Problem List   Diagnosis Date Noted   Hypertensive kidney disease with CKD stage III ([place]) 10/17/2020   Tremor 12/13/2019   Weakness generalized 06/08/2019   Frequent falls 06/08/2019   Pes anserinus bursitis of right knee 03/23/2019   Vertigo 11/24/2018   On amiodarone therapy 07/08/2018   Adjustment reaction with physical symptoms 09/16/2017   Atherosclerosis of abdominal aorta ([place]) 07/22/2017   Hypothyroidism 07/22/2017   CAD (coronary artery disease)    Long term  (current) use of anticoagulants 07/08/2017   Hereditary and idiopathic peripheral neuropathy 12/12/2015   Chronic diastolic CHF (congestive heart failure) ([place]) 06/05/2015   Pulmonary arterial hypertension ([place]) 05/17/2015   Tachy-brady syndrome ([place]) 05/06/2015   Hx of CABG 05/06/2015   Preventative health care 03/27/2015   S/P knee replacement 10/08/2014   Osteoporosis 03/22/2014   Paroxysmal atrial fibrillation ([place]) 02/27/2014   Hypertension    GERD (gastroesophageal reflux disease)    Dyslipidemia 09/23/2006   RHINITIS, ALLERGIC 09/23/2006   REFLUX ESOPHAGITIS 09/23/2006   DIVERTICULOSIS OF COLON 09/23/2006   BACK PAIN, LOW 09/23/2006   INSOMNIA NOS 09/23/2006    Past Surgical History:  Procedure Laterality Date   ABDOMINAL HYSTERECTOMY  1975   CARDIAC CATHETERIZATION N/A 05/09/2015   Procedure: Right Heart Cath;  Surgeon: Larey Dresser, MD;  Location: [place] CV LAB;  Service: Cardiovascular;  Laterality: N/A;   CARDIOVERSION N/A 02/28/2014   Procedure: CARDIOVERSION;  Surgeon: Sanda Klein, MD;  Location: [place] ENDOSCOPY;  Service: Cardiovascular;  Laterality: N/A;   CARDIOVERSION N/A 01/30/2019   Procedure: CARDIOVERSION;  Surgeon: Fay Records, MD;  Location: [place];  Service: Cardiovascular;  Laterality: N/A;   CATARACT EXTRACTION Bilateral 2014   with lens implanted   COLONOSCOPY N/A 02/19/2015   Procedure: COLONOSCOPY;  Surgeon: Ladene Artist, MD;  Location: [facility] ENDOSCOPY;  Service: Endoscopy;  Laterality: N/A;  CORONARY ARTERY BYPASS GRAFT  2006   off-pump bypass surgery with LIMA to the LAD and SVG to the second diagonal artery ( performed by Dr Servando Snare)    [place] Bilateral    L-2004, R-2006   LEFT HEART CATHETERIZATION WITH CORONARY/GRAFT ANGIOGRAM N/A 09/17/2014   Procedure: LEFT HEART CATHETERIZATION WITH Beatrix Fetters;  Surgeon: Troy Sine, MD;  Location: [facility] CATH LAB;  Service: Cardiovascular;  Laterality: N/A;    REIMPLANTATION OF TOTAL KNEE Right 10/08/2014   Procedure: REIMPLANTATION OF RIGHT TOTAL KNEE ARTHROPLASTY WITH REMOVAL OF ANTIBIOTIC SPACER;  Surgeon: Paralee Cancel, MD;  Location: [facility] OR[facility];  Service: Orthopedics;  Laterality: Right;   ROTATOR CUFF REPAIR Bilateral    r-1999, l- 2005   TEE WITHOUT CARDIOVERSION N/A 02/28/2014   Procedure: TRANSESOPHAGEAL ECHOCARDIOGRAM (TEE);  Surgeon: Sanda Klein, MD;  Location: [facility] ENDOSCOPY;  Service: Cardiovascular;  Laterality: N/A;   TEE WITHOUT CARDIOVERSION N/A 01/30/2019   Procedure: TRANSESOPHAGEAL ECHOCARDIOGRAM (TEE);  Surgeon: Fay Records, MD;  Location: [facility] END[facility]COPY;  Service: Cardiovascular;  Laterality: N/A;   TOTAL KNEE ARTHROPLASTY Right 07/02/2014   Procedure: Resection of Infected Right Total Knee Arthroplasty with placement antibiotic spacer;  Surgeon: Mauri Pole, MD;  Location: [facility] OR[facility];  Service: Orthopedics;  Laterality: Right;     OB History   No obstetric history on file.     Family History  Problem Relation Age of Onset   Parkinson's disease Mother    Heart attack Brother    Arthritis Brother    Cancer Brother        Unknown   Breast cancer Neg Hx     Social History   Tobacco Use   Smoking status: Never   Smokeless tobacco: Never  Vaping Use   Vaping Use: Never used  Substance Use Topics   Alcohol use: No   Drug use: No    Home Medications Prior to Admission medications   Medication Sig Start Date End Date Taking? Authorizing Provider  acetaminophen (TYLENOL) 500 MG tablet Take 500 mg by mouth every 6 (six) hours as needed for moderate pain (pain/headache.).     [provider]  ALPRAZolam (XANAX) 0.5 MG tablet TAKE 1 TABLET BY MOUTH 2 TIMES DAILY AS NEEDED FOR ANXIETY. 12/12/20   [name], MD  amiodarone (PACERONE) 200 MG tablet Take 1 tablet (200 mg total) by mouth daily. 06/11/20 03/07/23  [name], MD  apixaban (ELIQUIS) 2.5 MG TABS tablet Take 1 tablet (2.5 mg total) by mouth 2 (two)  times daily. 05/20/20   Croitoru, Mihai, MD  cholecalciferol (VITAMIN D3) 25 MCG (1000 UNIT) tablet Take 1,000 Units by mouth daily.    [provider]  cycloSPORINE (RESTASIS) 0.05 % ophthalmic emulsion Place 1 drop into both eyes 2 (two) times daily.    [provider]  gabapentin (NEURONTIN) 100 MG capsule TAKE 1 CAPSULE (100 MG TOTAL) BY MOUTH 3 (THREE) TIMES DAILY. 05/15/20   [name], MD  hydrALAZINE (APRESOLINE) 25 MG tablet TAKE ONE TABLET FOR SBP ABOVE 160 AS NEEDED UP TO 3 TABLET PER DAY. 07/23/20   Croitoru, Mihai, MD  levothyroxine (SYNTHROID) 75 MCG tablet TAKE 1 TABLET BY MOUTH EVERY DAY 05/06/20   [name], MD  meclizine (ANTIVERT) 12.5 MG tablet Take 1 tablet (12.5 mg total) by mouth 3 (three) times daily as needed for dizziness. 01/29/21   [name], MD  midodrine (PROAMATINE) 2.5 MG tablet TAKE 1 TABLET BY MOUTH IN  MORNING AND 1 TAB 6 HOURS LATER, DO NOT LAY FLAT FOR 4 HOURS AFTER TAKING THE MEDICATION. DO NOT TAKE FOR A SYSTOLIC OF 315 OF ABOVE. 09/09/20   Croitoru, Mihai, MD  Multiple Vitamin (MULTIVITAMIN WITH MINERALS) TABS tablet Take 1 tablet by mouth daily.    [provider]  nitrofurantoin (MACRODANTIN) 50 MG capsule Take 1 capsule (50 mg total) by mouth 4 (four) times daily. 07/09/20   [name], MD  nitroGLYCERIN (NITROSTAT) 0.4 MG SL tablet PLACE 1 TABLET UNDER THE TONGUE EVERY 5 MINUTES AS NEEDED FOR CHEST PAIN UP TO 3 DOSES 12/24/20   Croitoru, Mihai, MD  Omega-3 Fatty Acids (FISH OIL) 1000 MG CAPS Take 1,000 mg by mouth 2 (two) times a day.     [provider]  omeprazole (PRILOSEC) 20 MG capsule TAKE 1 CAPSULE BY MOUTH EVERY DAY 12/12/20   Hensel, Jamal Collin, MD  Polyethyl Glycol-Propyl Glycol (LUBRICANT EYE DROPS) 0.4-0.3 % SOLN Place 1-2 drops into both eyes 3 (three) times daily as needed (dry/irritated eyes.).    [provider]  polyethylene glycol powder ([employer] PURELAX) 17 GM/SCOOP powder MIX  17 G AND TAKE BY MOUTH DAILY. Patient taking differently: Take 1 Container by mouth 3 (three) times a week. 04/22/20   [name], MD  pravastatin (PRAVACHOL) 40 MG tablet Take 1 tablet (40 mg total) by mouth every evening. 06/13/20 09/11/20  Croitoru, Mihai, MD  rOPINIRole (REQUIP) 0.25 MG tablet TAKE 1 TABLET BY MOUTH THREE TIMES A DAY 05/15/20   [name], MD  traMADol (ULTRAM) 50 MG tablet TAKE 1 TABLET BY MOUTH EVERY 12 HOURS AS NEEDED FOR PAIN 12/24/20   [name], MD  traZODone (DESYREL) 100 MG tablet TAKE 1 TABLET BY MOUTH EVERYDAY AT BEDTIME 10/25/20   [name], MD    Allergies    Atorvastatin, Beta adrenergic blockers, Crestor [rosuvastatin], Morphine and related, and Prednisone  Review of Systems   Review of Systems  All other systems reviewed and are negative.  Physical Exam Updated Vital Signs BP (!) 190/89 (BP Location: Left Arm)   Pulse 60   Temp 98.4 F (36.9 C) (Oral)   Resp 18   Ht 5\' 1"  (1.549 m)   Wt 57.2 kg   SpO2 98%   BMI 23.83 kg/m   Physical Exam Vitals and nursing note reviewed.  Constitutional:      General: She is not in acute distress.    Appearance: She is well-developed. She is not diaphoretic.  HENT:     Head: Normocephalic and atraumatic.  Cardiovascular:     Rate and Rhythm: Normal rate and regular rhythm.     Heart sounds: No murmur heard.   No friction rub. No gallop.  Pulmonary:     Effort: Pulmonary effort is normal. No respiratory distress.     Breath sounds: Normal breath sounds. No wheezing.  Abdominal:     General: Bowel sounds are normal. There is no distension.     Palpations: Abdomen is soft.     Tenderness: There is no abdominal tenderness.  Musculoskeletal:        General: Normal range of motion.     Cervical back: Normal range of motion and neck supple.  Skin:    General: Skin is warm and dry.  Neurological:     General: No focal deficit present.     Mental Status: She is alert and oriented  to person, place, and time.  Cranial Nerves: No cranial nerve deficit.     Sensory: No sensory deficit.     Motor: No weakness.     Coordination: Coordination normal.    ED Results / Procedures / Treatments   Labs (all labs ordered are listed, but only abnormal results are displayed) Labs Reviewed  COMPREHENSIVE METABOLIC PANEL - Abnormal; Notable for the following components:      Result Value   Creatinine, Ser 1.59 (*)    GFR, Estimated 31 (*)    All other components within normal limits  CBC WITH DIFFERENTIAL/PLATELET - Abnormal; Notable for the following components:   RBC 3.77 (*)    Hemoglobin 11.7 (*)    All other components within normal limits    EKG EKG Interpretation  Date/Time:  Saturday February 08 2021 22:22:40 EDT Ventricular Rate:  59 PR Interval:  212 QRS Duration: 92 QT Interval:  444 QTC Calculation: 439 R Axis:   36 Text Interpretation: Sinus bradycardia with 1st degree A-V block Otherwise normal ECG Confirmed by Veryl Speak [phone]) on 02/08/2021 11:30:47 PM  Radiology No results found.  Procedures Procedures   Medications Ordered in ED Medications  cloNIDine (CATAPRES) tablet 0.1 mg (has no administration in time range)    ED Course  I have reviewed the triage vital signs and the nursing notes.  Pertinent labs & imaging results that were available during my care of the patient were reviewed by me and considered in my medical decision making (see chart for details).    MDM Rules/Calculators/A&P  Patient presenting here with complaints of elevated blood pressure and dizziness.  She arrives here with blood pressures of nearly 741 systolic, but is neurologically intact and CT scan, laboratory studies, and EKG show no evidence for endorgan damage.  Patient has received 0.1 mg of clonidine and her blood pressure is now 131/63.  At this point, discharge seems appropriate with outpatient follow-up with her primary doctor. I have agreed to prescribe  but small quantity of clonidine which she can take at home if her blood pressures spike again.  Final Clinical Impression(s) / ED Diagnoses Final diagnoses:  None    Rx / DC Orders ED Discharge Orders     None        Veryl Speak, MD 02/09/21 [phone]

## 2021-02-09 ENCOUNTER — Emergency Department (HOSPITAL_COMMUNITY): Payer: HMO

## 2021-02-09 DIAGNOSIS — R42 Dizziness and giddiness: Secondary | ICD-10-CM | POA: Diagnosis not present

## 2021-02-09 MED ORDER — CLONIDINE HCL 0.1 MG PO TABS: 0.1000 mg | ORAL_TABLET | Freq: Two times a day (BID) | ORAL | 0 refills | Status: DC | PRN

## 2021-02-09 NOTE — ED Notes (Signed)
Patient transported to CT 

## 2021-02-09 NOTE — Discharge Instructions (Addendum)
Continue medications as previously prescribed.  Take clonidine as prescribed as needed for sustained blood pressures greater than 756 systolic.  Keep a record of your blood pressures and follow-up with your primary doctor in the next week.

## 2021-02-10 ENCOUNTER — Telehealth: Payer: Self-pay

## 2021-02-10 DIAGNOSIS — R300 Dysuria: Secondary | ICD-10-CM | POA: Diagnosis not present

## 2021-02-10 DIAGNOSIS — N1832 Chronic kidney disease, stage 3b: Secondary | ICD-10-CM

## 2021-02-10 MED ORDER — HYDRALAZINE HCL 25 MG PO TABS: 25.0000 mg | ORAL_TABLET | Freq: Three times a day (TID) | ORAL | 6 refills | Status: DC

## 2021-02-10 NOTE — Telephone Encounter (Signed)
Patient calls nurse line reporting she was seen in the ED for hypertension on 7/16. Patient reports she was told to FU with PCP. Patient has an already scheduled apt with PCP for 7/27. Patient requests to only see PCP. Patient reports at home BP reading of 180/88 pulse 51 this morning. Patient reports she has not taken any BP medications yet today. Patient denies headaches, vision changes, SOB or chest pain. Patient would like PCP to call her to discuss when to take Clonidine. Strict ED precautions given to patient. Will forward to PCP.

## 2021-02-10 NOTE — Telephone Encounter (Signed)
Longstanding BP difficulties, with high systolics and low diastolics.   Do not want to change clonidine in that it can worsen bradycardia. She has only been taking hydralazine prn.  I switched her to scheduled hydralazine and sent new prescription. I verified that she is not taking midodrine regularly. She will keep her appointment with me in 9 days.

## 2021-02-10 NOTE — Telephone Encounter (Signed)
Called and left voice mail.  Clonidine should be taken regularly - Ms Filsaime should be taking twice daily.  It can cause rebound if taken intermitently.  My goal is systolic 623 or below and diastolic above 60.  The blood pressure she got today is just where I want it.

## 2021-02-10 NOTE — Telephone Encounter (Signed)
Jacqueline [name] (Tourist information centre manager) calls nurse line reporting [facility] went out to see her today. Jacqueline [name] wanted to report a BP: of 158/72 and HR: 74.  Rhonda would like parameters for when to take Clonidine.   Will forward to PCP to advise.

## 2021-02-11 ENCOUNTER — Telehealth: Payer: Self-pay

## 2021-02-11 NOTE — Telephone Encounter (Signed)
Jacqueline [name] Jacqueline [name] and I'm glad I did.  Clonidine is not a chronic med.  It was recently prescribed prn by ED.  I discontinued because of concerns over rebound and bradycardia.  Reviewed parameters for BP goal on midodrine usage.

## 2021-02-11 NOTE — Addendum Note (Signed)
Addended by: [name] on: 02/11/2021 09:47 AM   Modules accepted: Orders

## 2021-02-11 NOTE — Telephone Encounter (Signed)
Jacqueline [name] returns PCP phone call to nurse line. Jacqueline [name] is requesting PCP to call her sometime today to discuss further. Jacqueline [name] has some additional questions. She plans to call patient around 10am to check in and get blood pressure.

## 2021-02-11 NOTE — Telephone Encounter (Signed)
Rhonda calls nurse line again in regards to [place]. Suanne Marker reports patient has been complaining of urgency with little output since yesterday. Patient denies flank pain, dysuria,  fever or chills. Suanne Marker reports [facility] came to her home and obtained a urinalysis yesterday. Suanne Marker gave me their phone number to call for results. I called, however had to LVM. I will await their call.   Suanne Marker also reported patients BP was 191/92 HR 57 before taking Hydralazine. After taking Hydralazine patients BP came down to 151/78 HR 71.

## 2021-02-11 NOTE — Telephone Encounter (Signed)
Noted.  I await the results of the UA.

## 2021-02-15 DIAGNOSIS — R42 Dizziness and giddiness: Secondary | ICD-10-CM | POA: Diagnosis not present

## 2021-02-19 ENCOUNTER — Ambulatory Visit (INDEPENDENT_AMBULATORY_CARE_PROVIDER_SITE_OTHER): Payer: HMO | Admitting: Family Medicine

## 2021-02-19 ENCOUNTER — Encounter: Payer: Self-pay | Admitting: Family Medicine

## 2021-02-19 ENCOUNTER — Other Ambulatory Visit: Payer: Self-pay

## 2021-02-19 DIAGNOSIS — I10 Essential (primary) hypertension: Secondary | ICD-10-CM

## 2021-02-19 DIAGNOSIS — R42 Dizziness and giddiness: Secondary | ICD-10-CM | POA: Diagnosis not present

## 2021-02-19 DIAGNOSIS — Z79899 Other long term (current) drug therapy: Secondary | ICD-10-CM | POA: Diagnosis not present

## 2021-02-19 NOTE — Patient Instructions (Addendum)
I am delighted with the blood pressure today. In general, I suggest making only one change at a time.  Wait one week between each change.  I suggest that you. First stop the tramadol.  It is a pain medicine.  Look for complaints of pain, not relieved by tylenol Next stop the trazodone.   Last, slowly wean the alprazolam.  Continue taking one tab at bedtime for now.  When you are off the tramadol and trazodone, take 1/2 tab at bedtime for one week.  Then take 1/2 tab every other day for 2 weeks, then quit altogether.   If symptoms get troublesome while we are weaning off medications, call me and we will make a plan.   Someone from the physical therapy department should call about the rehab referral for dizziness.  See me in about six weeks to tweak things again.

## 2021-02-21 ENCOUNTER — Encounter: Payer: Self-pay | Admitting: Family Medicine

## 2021-02-21 DIAGNOSIS — Z79899 Other long term (current) drug therapy: Secondary | ICD-10-CM | POA: Insufficient documentation

## 2021-02-21 NOTE — Assessment & Plan Note (Signed)
DC meclizine since no benefit and Beers drug.  Refer to PT for neurovestibular rehab and attempted otolith repositioning.  Clinical dx is BPPV

## 2021-02-21 NOTE — Progress Notes (Signed)
    SUBJECTIVE:   CHIEF COMPLAINT / HPI:   FU hypertension, vertigo and polypharmacy.  Hypertension.  Finally, great control.  No complaints. Vertigo.  Low dose meclizine did not help.  Does have worsening with sudden head movements.  Never had otolith repositioning attempt. Polypharm.   Comes in with son.  Long discussion differentiating drugs which help her live longer and drugs that help her feel better.  Both want reduction in total meds. Poor appetite continues.  Weight loss has stabilized.   OBJECTIVE:   BP (!) 150/62   Pulse 62   Ht 5\' 1"  (1.549 m)   Wt 127 lb 3.2 oz (57.7 kg)   SpO2 96%   BMI 24.03 kg/m   Lungs clear Caridac RRR without m or g Ext no edema.  ASSESSMENT/PLAN:   Hypertension Finally, goo control  Vertigo DC meclizine since no benefit and Beers drug.  Refer to PT for neurovestibular rehab and attempted otolith repositioning.  Clinical dx is BPPV  Polypharmacy Goal is to decrease total meds.  Focus on meclizine, trazodone, tramadol and alprazolam.  See AVS for details.  Fu in 6 weeks.  They will tell me at that time what has been weaned off and I will again update her med list.       [name], [place]

## 2021-02-21 NOTE — Assessment & Plan Note (Signed)
Goal is to decrease total meds.  Focus on meclizine, trazodone, tramadol and alprazolam.  See AVS for details.  Fu in 6 weeks.  They will tell me at that time what has been weaned off and I will again update her med list.

## 2021-02-21 NOTE — Assessment & Plan Note (Signed)
Finally, goo control

## 2021-02-26 ENCOUNTER — Ambulatory Visit: Payer: HMO | Attending: Family Medicine

## 2021-02-26 ENCOUNTER — Other Ambulatory Visit: Payer: Self-pay

## 2021-02-26 DIAGNOSIS — R42 Dizziness and giddiness: Secondary | ICD-10-CM | POA: Insufficient documentation

## 2021-02-26 DIAGNOSIS — R2681 Unsteadiness on feet: Secondary | ICD-10-CM | POA: Diagnosis not present

## 2021-02-26 NOTE — Therapy (Signed)
[facility] [address] [place], [place], [address] Phone: [phone]   Fax:  [phone]  Physical Therapy Evaluation  Patient Details  Name: Jacqueline [name] MRN: [id number] Date of Birth: 13-Jun-1931 Referring Provider (PT): Madison Hickman, MD   Encounter Date: 02/26/2021   PT End of Session - 02/26/21 0758     Visit Number 1    Number of Visits 1    Date for PT Re-Evaluation 02/28/21    Authorization Type HTA (10th PN)    Progress Note Due on Visit 10    PT Start Time 0759    PT Stop Time 0835    PT Time Calculation (min) 36 min    Activity Tolerance Patient tolerated treatment well    Behavior During Therapy [facility] for tasks assessed/performed             Past Medical History:  Diagnosis Date   Arthritis    back   Atrial fibrillation with rapid ventricular response ([place]) 02/2014   a. CHA2DS2VASc = 6 -> on eliquis;  b. 02/2014 s/p DCCV;  c. 08/2014 Echo: EF 55-60%, Gr 2 DD, mild MR, triv AI.   CAD (coronary artery disease)    a. s/p CABG x 2 (LIMA->LAD, VG->Diag);  b. 08/2014 Cath: LM nl, LAD 90p, LCX nl, RCA nl/dominant, LIMA->LAD atretic, VG->D2 patent w/ retrograde filling of LAD, EF 55-60%-->Med Rx.   Diverticulitis 02/21/2012   GERD (gastroesophageal reflux disease)    Hyperlipemia    Hypertension    Insomnia     Past Surgical History:  Procedure Laterality Date   ABDOMINAL HYSTERECTOMY  1975   CARDIAC CATHETERIZATION N/A 05/09/2015   Procedure: Right Heart Cath;  Surgeon: Larey Dresser, MD;  Location: [place] CV LAB;  Service: Cardiovascular;  Laterality: N/A;   CARDIOVERSION N/A 02/28/2014   Procedure: CARDIOVERSION;  Surgeon: Sanda Klein, MD;  Location: [place] ENDOSCOPY;  Service: Cardiovascular;  Laterality: N/A;   CARDIOVERSION N/A 01/30/2019   Procedure: CARDIOVERSION;  Surgeon: Fay Records, MD;  Location: [place];  Service: Cardiovascular;  Laterality: N/A;   CATARACT EXTRACTION  Bilateral 2014   with lens implanted   COLONOSCOPY N/A 02/19/2015   Procedure: COLONOSCOPY;  Surgeon: Ladene Artist, MD;  Location: [facility] ENDOSCOPY;  Service: Endoscopy;  Laterality: N/A;   CORONARY ARTERY BYPASS GRAFT  2006   off-pump bypass surgery with LIMA to the LAD and SVG to the second diagonal artery ( performed by Dr Servando Snare)    [place] Bilateral    L-2004, R-2006   LEFT HEART CATHETERIZATION WITH CORONARY/GRAFT ANGIOGRAM N/A 09/17/2014   Procedure: LEFT HEART CATHETERIZATION WITH Beatrix Fetters;  Surgeon: Troy Sine, MD;  Location: [facility] CATH LAB;  Service: Cardiovascular;  Laterality: N/A;   REIMPLANTATION OF TOTAL KNEE Right 10/08/2014   Procedure: REIMPLANTATION OF RIGHT TOTAL KNEE ARTHROPLASTY WITH REMOVAL OF ANTIBIOTIC SPACER;  Surgeon: Paralee Cancel, MD;  Location: [facility];  Service: Orthopedics;  Laterality: Right;   ROTATOR CUFF REPAIR Bilateral    r-1999, l- 2005   TEE WITHOUT CARDIOVERSION N/A 02/28/2014   Procedure: TRANSESOPHAGEAL ECHOCARDIOGRAM (TEE);  Surgeon: Sanda Klein, MD;  Location: [facility] ENDOSCOPY;  Service: Cardiovascular;  Laterality: N/A;   TEE WITHOUT CARDIOVERSION N/A 01/30/2019   Procedure: TRANSESOPHAGEAL ECHOCARDIOGRAM (TEE);  Surgeon: Fay Records, MD;  Location: [facility] ENDOSCOPY;  Service: Cardiovascular;  Laterality: N/A;   TOTAL KNEE ARTHROPLASTY Right 07/02/2014   Procedure: Resection of Infected Right Total Knee Arthroplasty with placement antibiotic  spacer;  Surgeon: Mauri Pole, MD;  Location: [facility];  Service: Orthopedics;  Laterality: Right;    There were no vitals filed for this visit.    Subjective Assessment - 02/26/21 0759     Subjective Patient reports that she the dizziness started July 3rd. Patient reports that this has been the worst attack. Denies spinning. Patietn reports that the balance was impacted, but denies fall since that started. Patient reports that quick head movements stirred up the dizziness. Patient reports  she has not felt the dizziness in 3-4 days. Denies any affect on activities. Patient reports that she was using a RW at home when she was very dizzy, but has a SPC that she uses more often. Denies new vision or hearing changes. Denies sudden changes in sensation/strength. Patient reports that the dizziness lasted approx all day long.    Patient is accompained by: Family member   Son   Pertinent History Arthritis, A-Fib, CAD, GERD, HTN, HLD    Limitations Walking    Currently in Pain? No/denies   reports some soreness               [facility] PT Assessment - 02/26/21 0001       Assessment   Medical Diagnosis Vertigo    Referring Provider (PT) Madison Hickman, MD    Onset Date/Surgical Date 02/19/21   referral date   Prior [place] for Knee Replacement      Precautions   Precautions Fall      Balance Screen   Has the patient fallen in the past 6 months No    Has the patient had a decrease in activity level because of a fear of falling?  No    Is the patient reluctant to leave their home because of a fear of falling?  No      Home Environment   Living Environment Private residence    Living Arrangements Alone    Available Help at Discharge Family    Type of [place] Access Level entry    [place] One level    Additional Comments has son that lives behind her      Prior Function   Level of Independence Independent    Vocation Retired      Associate Professor   Overall Cognitive Status Within Functional Limits for tasks assessed      Observation/Other Assessments   Focus on Therapeutic Outcomes (FOTO)  staff did not capture on eval      Sensation   Light Touch Appears Intact      Coordination   Gross Motor Movements are Fluid and Coordinated Yes      Transfers   Transfers Sit to Stand;Stand to Sit    Sit to Stand 6: Modified independent (Device/Increase time)    Stand to Sit 6: Modified independent (Device/Increase time)      Ambulation/Gait    Ambulation/Gait Yes    Ambulation/Gait Assistance 6: Modified independent (Device/Increase time)    Ambulation Distance (Feet) 50 Feet    Assistive device Straight cane    Gait Pattern Within Functional Limits    Ambulation Surface Level;Indoor      High Level Balance   High Level Balance Comments Completed M-CTSIB: patient able to hold situation 1 and 2 for full 30 seconds.              Vestibular Assessment - 02/26/21 0001       Symptom Behavior   Subjective  history of current problem see subjective    Type of Dizziness  Imbalance;Unsteady with head/body turns;Blurred vision    Frequency of Dizziness was occuring daily basis    Duration of Dizziness lasted all day when extreme attack    Symptom Nature Motion provoked    Aggravating Factors Turning head quickly    Relieving Factors Rest;Slow movements    Progression of Symptoms Better      Oculomotor Exam   Oculomotor Alignment Normal    Ocular ROM WNL    Spontaneous Absent    Gaze-induced  Absent    Smooth Pursuits Intact    Saccades Intact      Vestibulo-Ocular Reflex   VOR 1 Head Only (x 1 viewing) Normal      Positional Testing   Dix-Hallpike Dix-Hallpike Right;Dix-Hallpike Left    Sidelying Test Sidelying Right    Horizontal Canal Testing Horizontal Canal Right;Horizontal Canal Left      Dix-Hallpike Right   Dix-Hallpike Right Duration 10 seconds; asymptomatic    Dix-Hallpike Right Symptoms Upbeat, right rotatory nystagmus      Dix-Hallpike Left   Dix-Hallpike Left Duration 0    Dix-Hallpike Left Symptoms No nystagmus      Sidelying Right   Sidelying Right Duration 0    Sidelying Right Symptoms No nystagmus      Horizontal Canal Right   Horizontal Canal Right Duration 0    Horizontal Canal Right Symptoms Normal      Horizontal Canal Left   Horizontal Canal Left Duration 0    Horizontal Canal Left Symptoms Normal      Positional Sensitivities   Sit to Supine No dizziness    Supine to Left Side  No dizziness    Supine to Right Side No dizziness    Supine to Sitting No dizziness    Right Hallpike No dizziness    Up from Right Hallpike No dizziness    Up from Left Hallpike No dizziness    Nose to Right Knee No dizziness    Right Knee to Sitting No dizziness    Nose to Left Knee No dizziness    Left Knee to Sitting No dizziness    Head Turning x 5 No dizziness    Head Nodding x 5 No dizziness    Pivot Right in Standing No dizziness    Pivot Left in Standing No dizziness    Rolling Right No dizziness    Rolling Left No dizziness             Objective measurements completed on examination: See above findings.      PT Education - 02/26/21 0758     Education Details No signs/symptoms of active BPPV at this time; will keep chart open in case of reoccurence    Person(s) Educated Patient;Child(ren)    Methods Explanation    Comprehension Verbalized understanding              Provided HEP for Balance:   Access Code: [email or web address] URL: [email or web address] Date: 02/26/2021 Prepared by: Baldomero Lamy  Exercises Romberg Stance with Eyes Closed - 1 x daily - 5 x weekly - 1 sets - 3 reps - 30 seconds hold           Plan - 02/26/21 0839     Clinical Impression Statement Patient is a 87 y.o. female referred to Neuro OPPT services for Vertigo. Patient's PMH significant for the following: Arthritis, A-Fib, CAD, GERD, HTN, HLD. Patient reports vertigo has significantly improved since initial  attack.  Patient presenting with normal oculomotor exam, no motion senstivity with MSQ, asymptomatic with positional testing, patient did have mild upbeating nystagmus with R Dix Hallpike but no rotary component noted. All other positional testing negative. PT educating on no significant findings on evaluation warranting PT treatment. Mild imbalance noted with M-CTSIB, however patient and son reporting this was present prior to vertigo. PT educating on balance HEP for  completion. No follow up visit scheduled, patient will call and scheduled if needed in case of reoccurence. Primray PT to keep chart open for 4 weeks.    Personal Factors and Comorbidities Comorbidity 3+    Comorbidities Arthritis, A-Fib, CAD, GERD, HTN, HLD    Examination-Activity Limitations Locomotion Level    Stability/Clinical Decision Making Stable/Uncomplicated    Clinical Decision Making Low    PT Frequency One time visit    PT Treatment/Interventions ADLs/Self Care Home Management;Canalith Repostioning;Vestibular;Therapeutic activities;Gait training;Stair training;Balance training;Neuromuscular re-education;DME Instruction;Patient/family education    PT Next Visit Plan Reassess BPPV if patient returns. Will need Re-Cert to cover visits if returns to PT services    Consulted and Agree with Plan of Care Patient             Patient will benefit from skilled therapeutic intervention in order to improve the following deficits and impairments:  Dizziness, Decreased balance  Visit Diagnosis: Dizziness and giddiness  Unsteadiness on feet     Problem List Patient Active Problem List   Diagnosis Date Noted   Polypharmacy 02/21/2021   Hypertensive kidney disease with CKD stage III ([place]) 10/17/2020   Tremor 12/13/2019   Weakness generalized 06/08/2019   Frequent falls 06/08/2019   Pes anserinus bursitis of right knee 03/23/2019   Vertigo 11/24/2018   On amiodarone therapy 07/08/2018   Adjustment reaction with physical symptoms 09/16/2017   Atherosclerosis of abdominal aorta ([place]) 07/22/2017   Hypothyroidism 07/22/2017   CAD (coronary artery disease)    Long term (current) use of anticoagulants 07/08/2017   Hereditary and idiopathic peripheral neuropathy 12/12/2015   Chronic diastolic CHF (congestive heart failure) ([place]) 06/05/2015   Pulmonary arterial hypertension ([place]) 05/17/2015   Tachy-brady syndrome ([place]) 05/06/2015   Hx of CABG 05/06/2015   Preventative health care  03/27/2015   S/P knee replacement 10/08/2014   Osteoporosis 03/22/2014   Paroxysmal atrial fibrillation ([place]) 02/27/2014   Hypertension    GERD (gastroesophageal reflux disease)    Dyslipidemia 09/23/2006   RHINITIS, ALLERGIC 09/23/2006   REFLUX ESOPHAGITIS 09/23/2006   DIVERTICULOSIS OF COLON 09/23/2006   BACK PAIN, LOW 09/23/2006   INSOMNIA NOS 09/23/2006    Jones Bales, PT, DPT 02/26/2021, 8:43 AM  [facility] [address] [place], [place], [address] Phone: [phone]   Fax:  [phone]  Name: Jacqueline [name] MRN: [id number] Date of Birth: 19-Sep-1930

## 2021-02-26 NOTE — Patient Instructions (Signed)
Access Code: [email or web address] URL: [email or web address] Date: 02/26/2021 Prepared by: Baldomero Lamy  Exercises Romberg Stance with Eyes Closed - 1 x daily - 5 x weekly - 1 sets - 3 reps - 30 seconds hold

## 2021-02-28 ENCOUNTER — Other Ambulatory Visit: Payer: Self-pay | Admitting: Cardiovascular Disease

## 2021-03-04 ENCOUNTER — Other Ambulatory Visit: Payer: Self-pay | Admitting: Family Medicine

## 2021-03-04 DIAGNOSIS — E039 Hypothyroidism, unspecified: Secondary | ICD-10-CM

## 2021-03-04 DIAGNOSIS — I480 Paroxysmal atrial fibrillation: Secondary | ICD-10-CM

## 2021-03-18 ENCOUNTER — Ambulatory Visit: Payer: HMO

## 2021-04-23 ENCOUNTER — Ambulatory Visit (INDEPENDENT_AMBULATORY_CARE_PROVIDER_SITE_OTHER): Payer: HMO | Admitting: Family Medicine

## 2021-04-23 ENCOUNTER — Other Ambulatory Visit: Payer: Self-pay

## 2021-04-23 ENCOUNTER — Encounter: Payer: Self-pay | Admitting: Family Medicine

## 2021-04-23 VITALS — BP 165/70 | HR 68 | Ht 61.0 in | Wt 126.4 lb

## 2021-04-23 DIAGNOSIS — G8929 Other chronic pain: Secondary | ICD-10-CM

## 2021-04-23 DIAGNOSIS — M545 Low back pain, unspecified: Secondary | ICD-10-CM | POA: Diagnosis not present

## 2021-04-23 DIAGNOSIS — Z23 Encounter for immunization: Secondary | ICD-10-CM | POA: Diagnosis not present

## 2021-04-23 DIAGNOSIS — R42 Dizziness and giddiness: Secondary | ICD-10-CM

## 2021-04-23 DIAGNOSIS — I10 Essential (primary) hypertension: Secondary | ICD-10-CM

## 2021-04-23 NOTE — Patient Instructions (Addendum)
For your blood pressure, we are trying to keep the top number below 160 and the bottom number below 90 and above 60.  Because most of your readings are in that range, we will keep you blood pressure medicines the same.   I ordered the physical therapy.  Someone should contact you.   I have very limited options for the vertigo.  We could try a 25 mg dose of the meclizine even though we should be cautious in using that medicine at your age.  If we try it, I would only want you to keep using it if clearly helped.

## 2021-04-24 ENCOUNTER — Encounter: Payer: Self-pay | Admitting: Family Medicine

## 2021-04-24 NOTE — Assessment & Plan Note (Signed)
We discussed that hip x rays would only be useful if she would contemplate the possibility of surgery.  She declined hip X rays.  She is willing to start back physical therapy to improve pain and strength and to decrease her fall risk.

## 2021-04-24 NOTE — Assessment & Plan Note (Signed)
At goal. No changes.  

## 2021-04-24 NOTE — Progress Notes (Signed)
    SUBJECTIVE:   CHIEF COMPLAINT / HPI:   Multiple issues: Worsening chronic back pain and bilateral hip pain that seems to come from the back.  No trauma.  Known DDD and facet arthropathy.  No bowel or bladder changes.  No new leg weakness.   Vertigo - worse.  Describes room spinning.  No vomiting.  Worried about fall risk.  Low dose meclizine did not seem to help.  She has been to neurovestibular rehab without benefit.  Fortunately no recent falls, but she and I are both worried about her fall risk. Hypertension.  Elevated in our office reading.  She brings in multiple home BP readings, almost all of which are in my goal range.    OBJECTIVE:   BP (!) 165/70   Pulse 68   Ht 5\' 1"  (1.549 m)   Wt 126 lb 6.4 oz (57.3 kg)   SpO2 96%   BMI 23.88 kg/m   Elevated BP noted and good home BPs reviewed Lungs clear Cardiac RRR without m or g. Ext no edema.    ASSESSMENT/PLAN:   BACK PAIN, LOW We discussed that hip x rays would only be useful if she would contemplate the possibility of surgery.  She declined hip X rays.  She is willing to start back physical therapy to improve pain and strength and to decrease her fall risk.  Vertigo Knowing it may contribute to orthostasis, we will try meclizine 25 mg.  If no benefit, stop.  If benefit, continue thinking she will have less fall risk on meclizine with treated vertigo than off meclizine with untreated vertigo.  Hypertension At goal.  No changes.     [name], MD [place]

## 2021-04-24 NOTE — Assessment & Plan Note (Signed)
Knowing it may contribute to orthostasis, we will try meclizine 25 mg.  If no benefit, stop.  If benefit, continue thinking she will have less fall risk on meclizine with treated vertigo than off meclizine with untreated vertigo.

## 2021-05-01 ENCOUNTER — Ambulatory Visit: Payer: HMO | Admitting: Cardiovascular Disease

## 2021-05-06 ENCOUNTER — Other Ambulatory Visit: Payer: Self-pay | Admitting: Family Medicine

## 2021-05-14 DIAGNOSIS — H16223 Keratoconjunctivitis sicca, not specified as Sjogren's, bilateral: Secondary | ICD-10-CM | POA: Diagnosis not present

## 2021-05-14 DIAGNOSIS — H04123 Dry eye syndrome of bilateral lacrimal glands: Secondary | ICD-10-CM | POA: Diagnosis not present

## 2021-05-14 DIAGNOSIS — Z961 Presence of intraocular lens: Secondary | ICD-10-CM | POA: Diagnosis not present

## 2021-05-16 ENCOUNTER — Other Ambulatory Visit: Payer: Self-pay | Admitting: Family Medicine

## 2021-05-16 DIAGNOSIS — G609 Hereditary and idiopathic neuropathy, unspecified: Secondary | ICD-10-CM

## 2021-05-28 ENCOUNTER — Other Ambulatory Visit: Payer: Self-pay | Admitting: Cardiovascular Disease

## 2021-05-30 DIAGNOSIS — I480 Paroxysmal atrial fibrillation: Secondary | ICD-10-CM | POA: Diagnosis not present

## 2021-05-30 DIAGNOSIS — Z6823 Body mass index (BMI) 23.0-23.9, adult: Secondary | ICD-10-CM | POA: Diagnosis not present

## 2021-05-30 DIAGNOSIS — Z7901 Long term (current) use of anticoagulants: Secondary | ICD-10-CM | POA: Diagnosis not present

## 2021-05-30 DIAGNOSIS — I700 Atherosclerosis of aorta: Secondary | ICD-10-CM | POA: Diagnosis not present

## 2021-05-30 DIAGNOSIS — I110 Hypertensive heart disease with heart failure: Secondary | ICD-10-CM | POA: Diagnosis not present

## 2021-05-30 DIAGNOSIS — I5030 Unspecified diastolic (congestive) heart failure: Secondary | ICD-10-CM | POA: Diagnosis not present

## 2021-05-30 DIAGNOSIS — Z888 Allergy status to other drugs, medicaments and biological substances status: Secondary | ICD-10-CM | POA: Diagnosis not present

## 2021-05-30 DIAGNOSIS — Z8679 Personal history of other diseases of the circulatory system: Secondary | ICD-10-CM | POA: Diagnosis not present

## 2021-05-30 DIAGNOSIS — I2510 Atherosclerotic heart disease of native coronary artery without angina pectoris: Secondary | ICD-10-CM | POA: Diagnosis not present

## 2021-05-30 DIAGNOSIS — M1711 Unilateral primary osteoarthritis, right knee: Secondary | ICD-10-CM | POA: Diagnosis not present

## 2021-05-30 DIAGNOSIS — M479 Spondylosis, unspecified: Secondary | ICD-10-CM | POA: Diagnosis not present

## 2021-05-30 DIAGNOSIS — D6869 Other thrombophilia: Secondary | ICD-10-CM | POA: Diagnosis not present

## 2021-06-16 NOTE — Progress Notes (Signed)
Cardiology Office Note:    Date:  06/17/2021   ID:  [name], DOB 12-05-1930, MRN [id number]  PCP:  [name], MD [place] Cardiologist: Sanda Klein, MD   Reason for visit: 33-month follow-up  History of Present Illness:    Jacqueline [name] is a 87 y.o. female with a hx of coronary artery disease and previous bypass surgery (2006), paroxysmal atrial fibrillation requiring previous cardioversion, diastolic heart failure, labile HTN, CKD (b/l Cr 1.5-1.7).  She last saw Dr. [name] in March 2022.  Today, she states she is doing well.  No chest pain, shortness of breath, PND, orthopnea and lower extremity edema.  She states she has not needed to take midodrine as her systolic blood pressure stayed over 100.  No lightheadedness.  She lives alone but has housecleaner that comes every 2 weeks.  Her son lives behind her and her sister lives next door.      Past Medical History:  Diagnosis Date   Arthritis    back   Atrial fibrillation with rapid ventricular response ([place]) 02/2014   a. CHA2DS2VASc = 6 -> on eliquis;  b. 02/2014 s/p DCCV;  c. 08/2014 Echo: EF 55-60%, Gr 2 DD, mild MR, triv AI.   CAD (coronary artery disease)    a. s/p CABG x 2 (LIMA->LAD, VG->Diag);  b. 08/2014 Cath: LM nl, LAD 90p, LCX nl, RCA nl/dominant, LIMA->LAD atretic, VG->D2 patent w/ retrograde filling of LAD, EF 55-60%-->Med Rx.   Diverticulitis 02/21/2012   GERD (gastroesophageal reflux disease)    Hyperlipemia    Hypertension    Insomnia     Past Surgical History:  Procedure Laterality Date   ABDOMINAL HYSTERECTOMY  1975   CARDIAC CATHETERIZATION N/A 05/09/2015   Procedure: Right Heart Cath;  Surgeon: Larey Dresser, MD;  Location: [facility];  Service: Cardiovascular;  Laterality: N/A;   CARDIOVERSION N/A 02/28/2014   Procedure: CARDIOVERSION;  Surgeon: Sanda Klein, MD;  Location: [facility];  Service: Cardiovascular;  Laterality: N/A;   CARDIOVERSION N/A 01/30/2019    Procedure: CARDIOVERSION;  Surgeon: Fay Records, MD;  Location: [place];  Service: Cardiovascular;  Laterality: N/A;   CATARACT EXTRACTION Bilateral 2014   with lens implanted   COLONOSCOPY N/A 02/19/2015   Procedure: COLONOSCOPY;  Surgeon: Ladene Artist, MD;  Location: [facility] ENDOSCOPY;  Service: Endoscopy;  Laterality: N/A;   CORONARY ARTERY BYPASS GRAFT  2006   off-pump bypass surgery with LIMA to the LAD and SVG to the second diagonal artery ( performed by Dr Servando Snare)    [place] Bilateral    L-2004, R-2006   LEFT HEART CATHETERIZATION WITH CORONARY/GRAFT ANGIOGRAM N/A 09/17/2014   Procedure: LEFT HEART CATHETERIZATION WITH Beatrix Fetters;  Surgeon: Troy Sine, MD;  Location: [facility] CATH LAB;  Service: Cardiovascular;  Laterality: N/A;   REIMPLANTATION OF TOTAL KNEE Right 10/08/2014   Procedure: REIMPLANTATION OF RIGHT TOTAL KNEE ARTHROPLASTY WITH REMOVAL OF ANTIBIOTIC SPACER;  Surgeon: Paralee Cancel, MD;  Location: [facility];  Service: Orthopedics;  Laterality: Right;   ROTATOR CUFF REPAIR Bilateral    r-1999, l- 2005   TEE WITHOUT CARDIOVERSION N/A 02/28/2014   Procedure: TRANSESOPHAGEAL ECHOCARDIOGRAM (TEE);  Surgeon: Sanda Klein, MD;  Location: [facility] ENDOSCOPY;  Service: Cardiovascular;  Laterality: N/A;   TEE WITHOUT CARDIOVERSION N/A 01/30/2019   Procedure: TRANSESOPHAGEAL ECHOCARDIOGRAM (TEE);  Surgeon: Fay Records, MD;  Location: [place];  Service: Cardiovascular;  Laterality: N/A;   TOTAL KNEE ARTHROPLASTY Right 07/02/2014  Procedure: Resection of Infected Right Total Knee Arthroplasty with placement antibiotic spacer;  Surgeon: Mauri Pole, MD;  Location: [facility];  Service: Orthopedics;  Laterality: Right;    Current Medications: Current Meds  Medication Sig   acetaminophen (TYLENOL) 500 MG tablet Take 500 mg by mouth every 6 (six) hours as needed for moderate pain (pain/headache.).    ALPRAZolam (XANAX) 0.5 MG tablet TAKE 1 TABLET BY MOUTH 2 TIMES  DAILY AS NEEDED FOR ANXIETY.   amiodarone (PACERONE) 200 MG tablet TAKE 1 TABLET BY MOUTH EVERY DAY   apixaban (ELIQUIS) 2.5 MG TABS tablet Take 1 tablet (2.5 mg total) by mouth 2 (two) times daily.   apixaban (ELIQUIS) 2.5 MG TABS tablet Take 1 tablet (2.5 mg total) by mouth 2 (two) times daily.   cholecalciferol (VITAMIN D3) 25 MCG (1000 UNIT) tablet Take 1,000 Units by mouth daily.   cycloSPORINE (RESTASIS) 0.05 % ophthalmic emulsion Place 1 drop into both eyes 2 (two) times daily.   gabapentin (NEURONTIN) 100 MG capsule TAKE 1 CAPSULE (100 MG TOTAL) BY MOUTH 3 (THREE) TIMES DAILY.   hydrALAZINE (APRESOLINE) 25 MG tablet Take 1 tablet (25 mg total) by mouth 3 (three) times daily.   levothyroxine (SYNTHROID) 75 MCG tablet TAKE 1 TABLET BY MOUTH EVERY DAY   midodrine (PROAMATINE) 2.5 MG tablet TAKE 1 TABLET BY MOUTH IN MORNING AND 1 TAB 6 HOURS LATER, DO NOT LAY FLAT FOR 4 HOURS AFTER TAKING THE MEDICATION. DO NOT TAKE FOR A SYSTOLIC OF 761 OF ABOVE.   Multiple Vitamin (MULTIVITAMIN WITH MINERALS) TABS tablet Take 1 tablet by mouth daily.   nitroGLYCERIN (NITROSTAT) 0.4 MG SL tablet PLACE 1 TABLET UNDER THE TONGUE EVERY 5 MINUTES AS NEEDED FOR CHEST PAIN UP TO 3 DOSES   Omega-3 Fatty Acids (FISH OIL) 1000 MG CAPS Take 1,000 mg by mouth 2 (two) times a day.    omeprazole (PRILOSEC) 20 MG capsule TAKE 1 CAPSULE BY MOUTH EVERY DAY   Polyethyl Glycol-Propyl Glycol (LUBRICANT EYE DROPS) 0.4-0.3 % SOLN Place 1-2 drops into both eyes 3 (three) times daily as needed (dry/irritated eyes.).   polyethylene glycol powder ([facility] PURELAX) 17 GM/SCOOP powder MIX 17 G AND TAKE BY MOUTH DAILY. (Patient taking differently: Take 1 Container by mouth 3 (three) times a week.)   pravastatin (PRAVACHOL) 40 MG tablet TAKE 1 TABLET BY MOUTH EVERY DAY IN THE EVENING   rOPINIRole (REQUIP) 0.25 MG tablet TAKE 1 TABLET BY MOUTH THREE TIMES A DAY   traMADol (ULTRAM) 50 MG tablet TAKE 1 TABLET BY MOUTH EVERY 12 HOURS AS NEEDED  FOR PAIN   traZODone (DESYREL) 100 MG tablet TAKE 1 TABLET BY MOUTH EVERYDAY AT BEDTIME     Allergies:   Atorvastatin, Beta adrenergic blockers, Crestor [rosuvastatin], Morphine and related, and Prednisone   Social History   Socioeconomic History   Marital status: Widowed    Spouse name: Not on file   Number of children: 2   Years of education: 10   Highest education level: 10th grade  Occupational History   Not on file  Tobacco Use   Smoking status: Never   Smokeless tobacco: Never  Vaping Use   Vaping Use: Never used  Substance and Sexual Activity   Alcohol use: No   Drug use: No   Sexual activity: Not Currently    Birth control/protection: Post-menopausal  Other Topics Concern   Not on file  Social History Narrative   Current Social History 05/11/2017  Patient lives alone in one level home    Transportation: Patient has own vehicle and drives herself    Important Relationships Children, grandchildren, great-grandchildren and daughter-in-law are all nearby   Pets: None 05/11/2017   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 Drinks water and one cup of coffee daily.   Religious / Personal Beliefs: Baptist. "I believe Jesus died on the cross to save me from my sins. I am saved by the grace of God."    Other: "I have a good life and am happy. My family is there when I need help."                                                              Social Determinants of Health   Financial Resource Strain: Not on file  Food Insecurity: Not on file  Transportation Needs: Not on file  Physical Activity: Not on file  Stress: Not on file  Social Connections: Not on file     Family History: The patient's family history includes Arthritis in her brother; Cancer in her brother; Heart attack in her brother; Parkinson's disease  in her mother. There is no history of Breast cancer.  ROS:   Please see the history of present illness.     EKGs/Labs/Other Studies Reviewed:    EKG:  The ekg ordered today demonstrates normal sinus rhythm, inverted T waves in 1, aVL, V1 and V2 similar to EKG in November 2021.  Heart rate 62, PR interval 114 ms, QRS duration 92 ms.  Recent Labs: 10/17/2020: TSH 1.420 02/08/2021: ALT 22; BUN 18; Creatinine, Ser 1.59; Hemoglobin 11.7; Platelets 166; Potassium 4.1; Sodium 139   Recent Lipid Panel Lab Results  Component Value Date/Time   CHOL 150 10/17/2020 12:07 PM   TRIG 106 10/17/2020 12:07 PM   HDL 57 10/17/2020 12:07 PM   LDLCALC 74 10/17/2020 12:07 PM    Physical Exam:    VS:  BP (!) 146/78 (BP Location: Left Arm, Patient Position: Sitting, Cuff Size: Normal)   Pulse 62   Ht 5\' 1"  (1.549 m)   Wt 128 lb (58.1 kg)   BMI 24.19 kg/m    No data found.  Wt Readings from Last 3 Encounters:  06/17/21 128 lb (58.1 kg)  04/23/21 126 lb 6.4 oz (57.3 kg)  02/19/21 127 lb 3.2 oz (57.7 kg)     GEN:  Well nourished, well developed in no acute distress HEENT: Normal NECK: No JVD; No carotid bruits CARDIAC: RRR, no murmurs, rubs, gallops RESPIRATORY:  Clear to auscultation without rales, wheezing or rhonchi  ABDOMEN: Soft, non-tender, non-distended MUSCULOSKELETAL: No edema; No deformity  SKIN: Warm and dry NEUROLOGIC:  Alert and oriented PSYCHIATRIC:  Normal affect     ASSESSMENT AND PLAN   Orthostatic hypotension Hypertension -SBP avg 140-160s -Continue current medications.  Allow permissive hypertension given history of orthostatic hypotension.  Paroxysmal AFib/flutter -Prolonged symptomatic episode in 2020 coinciding with GI bleeding, recurrent symptomatic episode of urinary tract infection in 2021.   -Continue amiodarone for rhythm control (significant bradycardia with 400 mg daily, tolerates 200 mg daily well) -Continue anticoagulation for  stroke prevention -Last  LFTs/TSH WNL.  CAD, no angina  -antianginal medications were stopped due to her problems with orthostatic hypotension and falls -No aspirin given need for anticoagulation.  Chronic diastolic CHF, euvolemic -Medical therapy limited by her tendency to have hypotension and falls.  Not on a diuretic  Hyperlipidemia -LDL 74 in March 2022.  -Continue pravastatin.   Dispo: Follow-up with Dr. [name] in 6 months.        Medication Adjustments/Labs and Tests Ordered: Current medicines are reviewed at length with the patient today.  Concerns regarding medicines are outlined above.  Orders Placed This Encounter  Procedures   EKG 12-Lead   Meds ordered this encounter  Medications   apixaban (ELIQUIS) 2.5 MG TABS tablet    Sig: Take 1 tablet (2.5 mg total) by mouth 2 (two) times daily.    Dispense:  56 tablet    Refill:  0    Patient Instructions  Medication Instructions:  No Changes *If you need a refill on your cardiac medications before your next appointment, please call your pharmacy*   Lab Work: No Labs If you have labs (blood work) drawn today and your tests are completely normal, you will receive your results only by: [facility] (if you have MyChart) OR A paper copy in the mail If you have any lab test that is abnormal or we need to change your treatment, we will call you to review the results.   Testing/Procedures: No Testing   Follow-Up: At [facility], you and your health needs are our priority.  As part of our continuing mission to provide you with exceptional heart care, we have created designated Provider Care Teams.  These Care Teams include your primary Cardiologist (physician) and Advanced Practice Providers (APPs -  Physician Assistants and Nurse Practitioners) who all work together to provide you with the care you need, when you need it.  We recommend signing up for the patient portal called "MyChart".  Sign up information is provided on this After  Visit Summary.  MyChart is used to connect with patients for Virtual Visits (Telemedicine).  Patients are able to view lab/test results, encounter notes, upcoming appointments, etc.  Non-urgent messages can be sent to your provider as well.   To learn more about what you can do with MyChart, go to [email or web address].    Your next appointment:   6 month(s)  The format for your next appointment:   In Person  Provider:   Sanda Klein, MD         Signed, Warren Lacy, PA-C  06/17/2021 12:33 PM    [place]

## 2021-06-17 ENCOUNTER — Encounter: Payer: Self-pay | Admitting: Physician Assistant

## 2021-06-17 ENCOUNTER — Other Ambulatory Visit: Payer: Self-pay

## 2021-06-17 ENCOUNTER — Ambulatory Visit: Payer: HMO | Admitting: Physician Assistant

## 2021-06-17 VITALS — BP 146/78 | HR 62 | Ht 61.0 in | Wt 128.0 lb

## 2021-06-17 DIAGNOSIS — E7800 Pure hypercholesterolemia, unspecified: Secondary | ICD-10-CM | POA: Diagnosis not present

## 2021-06-17 DIAGNOSIS — Z7901 Long term (current) use of anticoagulants: Secondary | ICD-10-CM

## 2021-06-17 DIAGNOSIS — I951 Orthostatic hypotension: Secondary | ICD-10-CM

## 2021-06-17 DIAGNOSIS — I480 Paroxysmal atrial fibrillation: Secondary | ICD-10-CM

## 2021-06-17 DIAGNOSIS — I5032 Chronic diastolic (congestive) heart failure: Secondary | ICD-10-CM

## 2021-06-17 DIAGNOSIS — I25728 Atherosclerosis of autologous artery coronary artery bypass graft(s) with other forms of angina pectoris: Secondary | ICD-10-CM | POA: Diagnosis not present

## 2021-06-17 DIAGNOSIS — I10 Essential (primary) hypertension: Secondary | ICD-10-CM | POA: Diagnosis not present

## 2021-06-17 MED ORDER — APIXABAN 2.5 MG PO TABS: 2.5000 mg | ORAL_TABLET | Freq: Two times a day (BID) | ORAL | 0 refills | Status: DC

## 2021-06-17 NOTE — Patient Instructions (Signed)
Medication Instructions:  °No Changes °*If you need a refill on your cardiac medications before your next appointment, please call your pharmacy* ° ° °Lab Work: °No Labs °If you have labs (blood work) drawn today and your tests are completely normal, you will receive your results only by: °MyChart Message (if you have MyChart) OR °A paper copy in the mail °If you have any lab test that is abnormal or we need to change your treatment, we will call you to review the results. ° ° °Testing/Procedures: °No Testing ° ° °Follow-Up: °At [facility], you and your health needs are our priority.  As part of our continuing mission to provide you with exceptional heart care, we have created designated Provider Care Teams.  These Care Teams include your primary Cardiologist (physician) and Advanced Practice Providers (APPs -  Physician Assistants and Nurse Practitioners) who all work together to provide you with the care you need, when you need it. ° °We recommend signing up for the patient portal called "MyChart".  Sign up information is provided on this After Visit Summary.  MyChart is used to connect with patients for Virtual Visits (Telemedicine).  Patients are able to view lab/test results, encounter notes, upcoming appointments, etc.  Non-urgent messages can be sent to your provider as well.   °To learn more about what you can do with MyChart, go to [email or web address].   ° °Your next appointment:   °6 month(s) ° °The format for your next appointment:   °In Person ° °Provider:   °Mihai Croitoru, MD   ° ° °  °

## 2021-06-20 ENCOUNTER — Other Ambulatory Visit: Payer: Self-pay | Admitting: Family Medicine

## 2021-06-20 DIAGNOSIS — F4329 Adjustment disorder with other symptoms: Secondary | ICD-10-CM

## 2021-07-03 ENCOUNTER — Other Ambulatory Visit: Payer: Self-pay | Admitting: Family Medicine

## 2021-07-03 ENCOUNTER — Ambulatory Visit: Payer: HMO | Admitting: Physician Assistant

## 2021-07-03 DIAGNOSIS — N1832 Hypertensive chronic kidney disease with stage 1 through stage 4 chronic kidney disease, or unspecified chronic kidney disease: Secondary | ICD-10-CM

## 2021-07-03 DIAGNOSIS — I129 Hypertensive chronic kidney disease with stage 1 through stage 4 chronic kidney disease, or unspecified chronic kidney disease: Secondary | ICD-10-CM

## 2021-07-03 DIAGNOSIS — D500 Iron deficiency anemia secondary to blood loss (chronic): Secondary | ICD-10-CM

## 2021-07-07 DIAGNOSIS — J069 Acute upper respiratory infection, unspecified: Secondary | ICD-10-CM | POA: Diagnosis not present

## 2021-07-11 ENCOUNTER — Other Ambulatory Visit: Payer: Self-pay | Admitting: Family Medicine

## 2021-08-13 DIAGNOSIS — H16223 Keratoconjunctivitis sicca, not specified as Sjogren's, bilateral: Secondary | ICD-10-CM | POA: Diagnosis not present

## 2021-08-13 DIAGNOSIS — H04123 Dry eye syndrome of bilateral lacrimal glands: Secondary | ICD-10-CM | POA: Diagnosis not present

## 2021-08-13 DIAGNOSIS — Z961 Presence of intraocular lens: Secondary | ICD-10-CM | POA: Diagnosis not present

## 2021-09-06 ENCOUNTER — Other Ambulatory Visit: Payer: Self-pay | Admitting: Cardiovascular Disease

## 2021-09-08 NOTE — Telephone Encounter (Signed)
Prescription refill request for Eliquis received. Indication:Afib Last office visit:11/22 Scr:1.5 Age: 88 Weight:58.1 kg  Prescription refilled

## 2021-09-09 ENCOUNTER — Other Ambulatory Visit: Payer: Self-pay | Admitting: Cardiovascular Disease

## 2021-09-09 ENCOUNTER — Other Ambulatory Visit: Payer: Self-pay | Admitting: Family Medicine

## 2021-09-09 ENCOUNTER — Telehealth: Payer: Self-pay | Admitting: Cardiovascular Disease

## 2021-09-09 NOTE — Telephone Encounter (Signed)
°*  STAT* If patient is at the pharmacy, call can be transferred to refill team.   1. Which medications need to be refilled? (please list name of each medication and dose if known) apixaban (ELIQUIS) 2.5 MG TABS tablet  2. Which pharmacy/location (including street and city if local pharmacy) is medication to be sent to?[id number] [address], [address]  Phone:  [phone] Fax:  [phone]  3. Do they need a 30 day or 90 day supply? 90 ds

## 2021-09-10 ENCOUNTER — Other Ambulatory Visit: Payer: Self-pay

## 2021-09-10 MED ORDER — APIXABAN 2.5 MG PO TABS: 2.5000 mg | ORAL_TABLET | Freq: Two times a day (BID) | ORAL | 0 refills | Status: DC

## 2021-09-10 NOTE — Telephone Encounter (Signed)
Prescription refill request for Eliquis received. Indication:Afib Last office visit:11/22 Scr:1.5 Age: 88 Weight:58.1 kg  Prescription refilled

## 2021-09-26 ENCOUNTER — Other Ambulatory Visit: Payer: Self-pay | Admitting: Cardiovascular Disease

## 2021-09-26 ENCOUNTER — Telehealth: Payer: Self-pay | Admitting: Cardiovascular Disease

## 2021-09-26 ENCOUNTER — Other Ambulatory Visit: Payer: Self-pay

## 2021-09-26 DIAGNOSIS — I129 Hypertensive chronic kidney disease with stage 1 through stage 4 chronic kidney disease, or unspecified chronic kidney disease: Secondary | ICD-10-CM

## 2021-09-26 DIAGNOSIS — N1832 Chronic kidney disease, stage 3b: Secondary | ICD-10-CM

## 2021-09-26 MED ORDER — HYDRALAZINE HCL 25 MG PO TABS: 37.5000 mg | ORAL_TABLET | Freq: Three times a day (TID) | ORAL | 2 refills | Status: DC

## 2021-09-26 NOTE — Telephone Encounter (Signed)
Please increase the hydralazine to 37.5 mg (which is 1.5 tablets),  3 times a day.  Do not take it if systolic blood pressure is less than 130.  She will probably need a new prescription. ?

## 2021-09-26 NOTE — Telephone Encounter (Signed)
Contacted patient, made aware of MD recommendations.  ?Patient verbalized understanding.  ? ?Increase Hydralazine to 37.5 mg three times daily. ?

## 2021-09-26 NOTE — Telephone Encounter (Signed)
Contacted patient's home health nurse. She states they went out for a general visit. She states patient was doing well, other than she complained of having some weakness at times, and dizziness. She states patient keeps a detailed BP log and over the past two months she did not see a blood pressure lower than 140 SBP. She states at times it can be higher. Patient denied any medication changes, and nurse advised with me no change to medication list. Advised that patient has issues with orthostatic hypotension, but she checked her standing today and it was 173/93 Hr 58. I advised I could send a message to our [facility] to see of any updated or recommendations, and Dr.C to review.  ? ?I can contact patient to advise further of any changes, nurse requested a call with an update as well.  ? ? ? ? ?

## 2021-09-26 NOTE — Telephone Encounter (Signed)
She has instructions to take hydralazine only when her systolic blood pressure is over 160.  By definition then, we are tolerating any blood pressures up to 160.   ?Did she take hydralazine for the elevated blood pressures in the 170s and 190s?  Did she have any side effects when she did? ?If the answer is "yes she has taken hydralazine and did not have any problems with hypotension after taking it", then I would recommend that she start taking scheduled hydralazine 25 mg 3 times a day.  But she should not take hydralazine if her systolic blood pressure is less than 130. ?

## 2021-09-26 NOTE — Telephone Encounter (Signed)
Brandi from [facility] called about patient BP. She had BP of 192/88 and 196/83.  Has weakness and dizziness. Standing it was 173/93.  She reviewed BP log  which range from 140-190's/69-90's. ? ?Wants to know what the doctor would recommend.  ?

## 2021-09-26 NOTE — Telephone Encounter (Signed)
Prescription refill request for Eliquis received. ?Indication:Afib ?Last office visit:11/22 ?Scr:1.4 ?Age: 88 ?Weight:58.1 kg ? ?Prescription refilled ? ?

## 2021-09-26 NOTE — Telephone Encounter (Signed)
Contacted patient she states she has already been taking her hydralazine 25 mg three times daily. She states this is what the prescription said to do, she has not had her BP lower than 130.  ? ?Any other recommendations?  ?

## 2021-10-09 DIAGNOSIS — Z96653 Presence of artificial knee joint, bilateral: Secondary | ICD-10-CM | POA: Diagnosis not present

## 2021-10-09 DIAGNOSIS — M5416 Radiculopathy, lumbar region: Secondary | ICD-10-CM | POA: Diagnosis not present

## 2021-10-20 ENCOUNTER — Ambulatory Visit (INDEPENDENT_AMBULATORY_CARE_PROVIDER_SITE_OTHER): Payer: HMO | Admitting: Family Medicine

## 2021-10-20 ENCOUNTER — Encounter: Payer: Self-pay | Admitting: Family Medicine

## 2021-10-20 ENCOUNTER — Other Ambulatory Visit: Payer: Self-pay

## 2021-10-20 DIAGNOSIS — I129 Hypertensive chronic kidney disease with stage 1 through stage 4 chronic kidney disease, or unspecified chronic kidney disease: Secondary | ICD-10-CM | POA: Diagnosis not present

## 2021-10-20 DIAGNOSIS — Z79899 Other long term (current) drug therapy: Secondary | ICD-10-CM

## 2021-10-20 DIAGNOSIS — I2510 Atherosclerotic heart disease of native coronary artery without angina pectoris: Secondary | ICD-10-CM

## 2021-10-20 DIAGNOSIS — I10 Essential (primary) hypertension: Secondary | ICD-10-CM | POA: Diagnosis not present

## 2021-10-20 DIAGNOSIS — Z0000 Encounter for general adult medical examination without abnormal findings: Secondary | ICD-10-CM

## 2021-10-20 DIAGNOSIS — I5032 Chronic diastolic (congestive) heart failure: Secondary | ICD-10-CM

## 2021-10-20 DIAGNOSIS — R531 Weakness: Secondary | ICD-10-CM | POA: Diagnosis not present

## 2021-10-20 DIAGNOSIS — N1832 Chronic kidney disease, stage 3b: Secondary | ICD-10-CM | POA: Diagnosis not present

## 2021-10-20 DIAGNOSIS — I480 Paroxysmal atrial fibrillation: Secondary | ICD-10-CM

## 2021-10-20 NOTE — Assessment & Plan Note (Signed)
Seems well controled on current meds, unless her generalized weakness is attributed to her heart disease. ?

## 2021-10-20 NOTE — Assessment & Plan Note (Signed)
My goal is to screen for a subtle medical problem that may be contributing.  I believe this is largely normal aging ?

## 2021-10-20 NOTE — Patient Instructions (Signed)
Today I did your annual wellness exam. ?You look great. ?I will check to see if I can find any medical cause of your weakness.   ?I will call with lab test results.   ?

## 2021-10-20 NOTE — Assessment & Plan Note (Signed)
I am satisfied with current control.  No change. ?

## 2021-10-20 NOTE — Assessment & Plan Note (Signed)
Rate controled.  Check for anemia.   ?

## 2021-10-20 NOTE — Assessment & Plan Note (Signed)
Check BMP to see if progression. ?

## 2021-10-20 NOTE — Assessment & Plan Note (Signed)
Healthy female with no at risk behaviors.  She has now lived longer than anyone in her family has lived.  We are in uncharted territory for her.  Fortunately, she has her independence and good quality of life. ?

## 2021-10-20 NOTE — Progress Notes (Signed)
? ? ?  SUBJECTIVE:  ? ?CHIEF COMPLAINT / HPI:  ? ?Annual exam: ?Acute problems: Just getting over a flare of chronic back pain.  She was treated with a course of cortisone by her ortho.  Seems to be now improving.  Happy to get off the cortisone in that it makes her jittery and sleep poorly. ?Chronic problems: ?Generalized weakness:  Unclear how much may be aging and how much could be an underlying medical problem.  She has the following chronic problems which could influence her weakness. ?CAD: On statin (only low intensity) she was intollerant of crestor and lipitor.  Not on ASA but on eliquis.  See next problem.  Denies chest pain. ?PAF on apixiban.  No bleeding noted.  On amiodorone.  Denies SOB.  Her complaint is weakness.  TSH disccussed below ?Chronic HFpEF.  No pedal edema or orthopnea ?CKD3B.  No previous electrolyte abnormalities.   ?Hypothyroid on replacement.  Due for TSH.   ? ?She also has systolic hypertension.  Diastolics are fine.  Brings in a long record of home BPs.  Most systolics are less than 888/ ? ?HPDP: She has aged out of most screening.  Declines further COVID and shingrix.  Will consider tetanus.   ? ?Social: Continues to live alone with great, nearby family support. ? ?PERTINENT  PMH / PSH: See above.  Denies change in bowel, bladder, appetite or weight.   ? ?OBJECTIVE:  ? ?BP 128/70   Pulse 65   Wt 124 lb 9.6 oz (56.5 kg)   SpO2 98%   BMI 23.54 kg/m?   ?VS noted. ?Neck supple ?Lungs clear ?Cardiac 1/6 SEM (aortic sclerosis without stenosis on last echo.) ?Abd benign ?Ext no edema ?Neuro, Motor, sensory gait cognition and affect all grossly normal. ? ? ?ASSESSMENT/PLAN:  ? ?Preventative health care ?Healthy female with no at risk behaviors.  She has now lived longer than anyone in her family has lived.  We are in uncharted territory for her.  Fortunately, she has her independence and good quality of life. ? ?Hypertension ?I am satisfied with current control.  No change. ? ?Hypertensive  kidney disease with CKD stage III ([place]) ?Check BMP to see if progression. ? ?Chronic diastolic CHF (congestive heart failure) ([place]) ?Seems well controled on current meds, unless her generalized weakness is attributed to her heart disease. ? ?Paroxysmal atrial fibrillation ([place]) ?Rate controled.  Check for anemia.   ? ?CAD (coronary artery disease) ?Seems well controled on current meds, unless her generalized weakness is attributed to her heart disease. ? ?Weakness generalized ?My goal is to screen for a subtle medical problem that may be contributing.  I believe this is largely normal aging ?  ? ? ?[name], MD ?[place]  ?

## 2021-10-21 ENCOUNTER — Telehealth: Payer: Self-pay | Admitting: Family Medicine

## 2021-10-21 LAB — CMP14+EGFR
ALT: 30 IU/L (ref 0–32)
AST: 21 IU/L (ref 0–40)
Albumin/Globulin Ratio: 1.8 (ref 1.2–2.2)
Albumin: 3.8 g/dL (ref 3.6–4.6)
Alkaline Phosphatase: 65 IU/L (ref 44–121)
BUN/Creatinine Ratio: 15 (ref 12–28)
BUN: 22 mg/dL (ref 8–27)
Bilirubin Total: 0.3 mg/dL (ref 0.0–1.2)
CO2: 23 mmol/L (ref 20–29)
Calcium: 9.2 mg/dL (ref 8.7–10.3)
Chloride: 108 mmol/L — ABNORMAL HIGH (ref 96–106)
Creatinine, Ser: 1.5 mg/dL — ABNORMAL HIGH (ref 0.57–1.00)
Globulin, Total: 2.1 g/dL (ref 1.5–4.5)
Glucose: 82 mg/dL (ref 70–99)
Potassium: 5.2 mmol/L (ref 3.5–5.2)
Sodium: 142 mmol/L (ref 134–144)
Total Protein: 5.9 g/dL — ABNORMAL LOW (ref 6.0–8.5)
eGFR: 33 mL/min/{1.73_m2} — ABNORMAL LOW (ref >59)

## 2021-10-21 LAB — TSH: TSH: 1.09 u[IU]/mL (ref 0.450–4.500)

## 2021-10-21 LAB — CBC
Hematocrit: 34.3 % (ref 34.0–46.6)
Hemoglobin: 11.3 g/dL (ref 11.1–15.9)
MCH: 31.9 pg (ref 26.6–33.0)
MCHC: 32.9 g/dL (ref 31.5–35.7)
MCV: 97 fL (ref 79–97)
Platelets: 176 10*3/uL (ref 150–450)
RBC: 3.54 x10E6/uL — ABNORMAL LOW (ref 3.77–5.28)
RDW: 12.7 % (ref 11.7–15.4)
WBC: 9 10*3/uL (ref 3.4–10.8)

## 2021-10-21 LAB — LIPID PANEL
Chol/HDL Ratio: 2.4 ratio (ref 0.0–4.4)
Cholesterol, Total: 147 mg/dL (ref 100–199)
HDL: 62 mg/dL (ref >39)
LDL Chol Calc (NIH): 61 mg/dL (ref 0–99)
Triglycerides: 139 mg/dL (ref 0–149)
VLDL Cholesterol Cal: 24 mg/dL (ref 5–40)

## 2021-10-21 LAB — SEDIMENTATION RATE: Sed Rate: 4 mm/hr (ref 0–40)

## 2021-10-21 NOTE — Telephone Encounter (Signed)
Called:  Labs are all reassuring.  No change.  No obvious medical reason for her weakness.   ?

## 2021-10-22 ENCOUNTER — Emergency Department (HOSPITAL_COMMUNITY): Payer: HMO

## 2021-10-22 ENCOUNTER — Other Ambulatory Visit: Payer: Self-pay

## 2021-10-22 ENCOUNTER — Inpatient Hospital Stay (HOSPITAL_COMMUNITY)
Admission: EM | Admit: 2021-10-22 | Discharge: 2021-10-24 | DRG: 291 | Disposition: A | Discharge status: Home / Self Care | Payer: HMO | Attending: Internal Medicine | Admitting: Internal Medicine

## 2021-10-22 ENCOUNTER — Encounter (HOSPITAL_COMMUNITY): Payer: Self-pay

## 2021-10-22 DIAGNOSIS — J9601 Acute respiratory failure with hypoxia: Secondary | ICD-10-CM

## 2021-10-22 DIAGNOSIS — Z8249 Family history of ischemic heart disease and other diseases of the circulatory system: Secondary | ICD-10-CM | POA: Diagnosis not present

## 2021-10-22 DIAGNOSIS — Z20822 Contact with and (suspected) exposure to covid-19: Secondary | ICD-10-CM | POA: Diagnosis present

## 2021-10-22 DIAGNOSIS — Z885 Allergy status to narcotic agent status: Secondary | ICD-10-CM

## 2021-10-22 DIAGNOSIS — I2721 Secondary pulmonary arterial hypertension: Secondary | ICD-10-CM

## 2021-10-22 DIAGNOSIS — R9431 Abnormal electrocardiogram [ECG] [EKG]: Secondary | ICD-10-CM

## 2021-10-22 DIAGNOSIS — I5032 Chronic diastolic (congestive) heart failure: Secondary | ICD-10-CM | POA: Diagnosis not present

## 2021-10-22 DIAGNOSIS — Z951 Presence of aortocoronary bypass graft: Secondary | ICD-10-CM

## 2021-10-22 DIAGNOSIS — Z7901 Long term (current) use of anticoagulants: Secondary | ICD-10-CM | POA: Diagnosis not present

## 2021-10-22 DIAGNOSIS — G8929 Other chronic pain: Secondary | ICD-10-CM | POA: Diagnosis not present

## 2021-10-22 DIAGNOSIS — I5033 Acute on chronic diastolic (congestive) heart failure: Secondary | ICD-10-CM | POA: Diagnosis not present

## 2021-10-22 DIAGNOSIS — I495 Sick sinus syndrome: Secondary | ICD-10-CM

## 2021-10-22 DIAGNOSIS — Z888 Allergy status to other drugs, medicaments and biological substances status: Secondary | ICD-10-CM

## 2021-10-22 DIAGNOSIS — I4891 Unspecified atrial fibrillation: Secondary | ICD-10-CM | POA: Diagnosis not present

## 2021-10-22 DIAGNOSIS — I10 Essential (primary) hypertension: Secondary | ICD-10-CM | POA: Diagnosis not present

## 2021-10-22 DIAGNOSIS — R778 Other specified abnormalities of plasma proteins: Secondary | ICD-10-CM | POA: Diagnosis not present

## 2021-10-22 DIAGNOSIS — E039 Hypothyroidism, unspecified: Secondary | ICD-10-CM

## 2021-10-22 DIAGNOSIS — J40 Bronchitis, not specified as acute or chronic: Secondary | ICD-10-CM | POA: Diagnosis not present

## 2021-10-22 DIAGNOSIS — I130 Hypertensive heart and chronic kidney disease with heart failure and stage 1 through stage 4 chronic kidney disease, or unspecified chronic kidney disease: Secondary | ICD-10-CM | POA: Diagnosis not present

## 2021-10-22 DIAGNOSIS — Z8261 Family history of arthritis: Secondary | ICD-10-CM | POA: Diagnosis not present

## 2021-10-22 DIAGNOSIS — Z820 Family history of epilepsy and other diseases of the nervous system: Secondary | ICD-10-CM

## 2021-10-22 DIAGNOSIS — R7989 Other specified abnormal findings of blood chemistry: Secondary | ICD-10-CM | POA: Diagnosis present

## 2021-10-22 DIAGNOSIS — E785 Hyperlipidemia, unspecified: Secondary | ICD-10-CM | POA: Diagnosis not present

## 2021-10-22 DIAGNOSIS — I2510 Atherosclerotic heart disease of native coronary artery without angina pectoris: Secondary | ICD-10-CM | POA: Diagnosis not present

## 2021-10-22 DIAGNOSIS — I509 Heart failure, unspecified: Secondary | ICD-10-CM | POA: Insufficient documentation

## 2021-10-22 DIAGNOSIS — Z79890 Hormone replacement therapy (postmenopausal): Secondary | ICD-10-CM

## 2021-10-22 DIAGNOSIS — N1832 Chronic kidney disease, stage 3b: Secondary | ICD-10-CM | POA: Diagnosis present

## 2021-10-22 DIAGNOSIS — Z96651 Presence of right artificial knee joint: Secondary | ICD-10-CM | POA: Diagnosis present

## 2021-10-22 DIAGNOSIS — R509 Fever, unspecified: Secondary | ICD-10-CM | POA: Diagnosis not present

## 2021-10-22 DIAGNOSIS — M549 Dorsalgia, unspecified: Secondary | ICD-10-CM | POA: Diagnosis not present

## 2021-10-22 DIAGNOSIS — R0602 Shortness of breath: Secondary | ICD-10-CM | POA: Diagnosis not present

## 2021-10-22 DIAGNOSIS — K219 Gastro-esophageal reflux disease without esophagitis: Secondary | ICD-10-CM | POA: Diagnosis not present

## 2021-10-22 DIAGNOSIS — B9789 Other viral agents as the cause of diseases classified elsewhere: Secondary | ICD-10-CM | POA: Diagnosis not present

## 2021-10-22 DIAGNOSIS — J069 Acute upper respiratory infection, unspecified: Secondary | ICD-10-CM | POA: Diagnosis not present

## 2021-10-22 DIAGNOSIS — I480 Paroxysmal atrial fibrillation: Secondary | ICD-10-CM | POA: Diagnosis not present

## 2021-10-22 DIAGNOSIS — I951 Orthostatic hypotension: Secondary | ICD-10-CM | POA: Diagnosis present

## 2021-10-22 HISTORY — DX: Bronchitis, not specified as acute or chronic: J40

## 2021-10-22 HISTORY — DX: Other specified abnormal findings of blood chemistry: R79.89

## 2021-10-22 HISTORY — DX: Abnormal electrocardiogram (ECG) (EKG): R94.31

## 2021-10-22 LAB — CBC WITH DIFFERENTIAL/PLATELET
Abs Immature Granulocytes: 0.06 10*3/uL (ref 0.00–0.07)
Basophils Absolute: 0 10*3/uL (ref 0.0–0.1)
Basophils Relative: 0 %
Eosinophils Absolute: 0.1 10*3/uL (ref 0.0–0.5)
Eosinophils Relative: 0 %
HCT: 36.2 % (ref 36.0–46.0)
Hemoglobin: 12.2 g/dL (ref 12.0–15.0)
Immature Granulocytes: 1 %
Lymphocytes Relative: 8 %
Lymphs Abs: 0.9 10*3/uL (ref 0.7–4.0)
MCH: 32.6 pg (ref 26.0–34.0)
MCHC: 33.7 g/dL (ref 30.0–36.0)
MCV: 96.8 fL (ref 80.0–100.0)
Monocytes Absolute: 0.7 10*3/uL (ref 0.1–1.0)
Monocytes Relative: 6 %
Neutro Abs: 10.4 10*3/uL — ABNORMAL HIGH (ref 1.7–7.7)
Neutrophils Relative %: 85 %
Platelets: 155 10*3/uL (ref 150–400)
RBC: 3.74 MIL/uL — ABNORMAL LOW (ref 3.87–5.11)
RDW: 14.6 % (ref 11.5–15.5)
WBC: 12.1 10*3/uL — ABNORMAL HIGH (ref 4.0–10.5)
nRBC: 0 % (ref 0.0–0.2)

## 2021-10-22 LAB — URINALYSIS, MICROSCOPIC (REFLEX)

## 2021-10-22 LAB — RESP PANEL BY RT-PCR (FLU A&B, COVID) ARPGX2
Influenza A by PCR: NEGATIVE
Influenza B by PCR: NEGATIVE
SARS Coronavirus 2 by RT PCR: NEGATIVE

## 2021-10-22 LAB — TROPONIN I (HIGH SENSITIVITY)
Troponin I (High Sensitivity): 27 ng/L — ABNORMAL HIGH (ref <18)
Troponin I (High Sensitivity): 34 ng/L — ABNORMAL HIGH (ref <18)

## 2021-10-22 LAB — URINALYSIS, ROUTINE W REFLEX MICROSCOPIC
Bilirubin Urine: NEGATIVE
Glucose, UA: NEGATIVE mg/dL
Hgb urine dipstick: NEGATIVE
Ketones, ur: NEGATIVE mg/dL
Nitrite: NEGATIVE
Protein, ur: NEGATIVE mg/dL
Specific Gravity, Urine: <1.005 — ABNORMAL LOW (ref 1.005–1.030)
pH: 5.5 (ref 5.0–8.0)

## 2021-10-22 LAB — BASIC METABOLIC PANEL
Anion gap: 10 (ref 5–15)
BUN: 16 mg/dL (ref 8–23)
CO2: 23 mmol/L (ref 22–32)
Calcium: 9.1 mg/dL (ref 8.9–10.3)
Chloride: 106 mmol/L (ref 98–111)
Creatinine, Ser: 1.42 mg/dL — ABNORMAL HIGH (ref 0.44–1.00)
GFR, Estimated: 36 mL/min — ABNORMAL LOW (ref >60)
Glucose, Bld: 128 mg/dL — ABNORMAL HIGH (ref 70–99)
Potassium: 3.1 mmol/L — ABNORMAL LOW (ref 3.5–5.1)
Sodium: 139 mmol/L (ref 135–145)

## 2021-10-22 LAB — D-DIMER, QUANTITATIVE: D-Dimer, Quant: 0.49 ug/mL-FEU (ref 0.00–0.50)

## 2021-10-22 LAB — BRAIN NATRIURETIC PEPTIDE: B Natriuretic Peptide: 435.4 pg/mL — ABNORMAL HIGH (ref 0.0–100.0)

## 2021-10-22 MED ORDER — POTASSIUM CHLORIDE 10 MEQ/100ML IV SOLN
10.0000 meq | INTRAVENOUS | Status: AC
  Administered 2021-10-23 (×2): 10 meq via INTRAVENOUS
  Filled 2021-10-22 (×2): qty 100

## 2021-10-22 MED ORDER — SODIUM CHLORIDE 0.9 % IV SOLN
500.0000 mg | INTRAVENOUS | Status: DC
  Administered 2021-10-22 – 2021-10-23 (×2): 500 mg via INTRAVENOUS
  Filled 2021-10-22 (×3): qty 5

## 2021-10-22 MED ORDER — FUROSEMIDE 10 MG/ML IJ SOLN
20.0000 mg | Freq: Once | INTRAMUSCULAR | Status: AC
  Administered 2021-10-22: 20 mg via INTRAVENOUS
  Filled 2021-10-22: qty 2

## 2021-10-22 NOTE — ED Provider Triage Note (Signed)
Emergency Medicine Provider Triage Evaluation Note ? ?Jacqueline [name] , a 88 y.o. female  was evaluated in triage.  Pt complains of shortness of breath.  Patient presents with sudden onset shortness of breath and cough starting last night. She just does not feel well. She has associated chest tightness. Denies any fevers. Her grandchild was sick on Friday whom she was around. Cough is productive with green sputum. ? ?Review of Systems  ?Positive:  ?Negative:  ? ?Physical Exam  ?BP 123/82   Pulse (!) 139   Temp 98.7 ?F (37.1 ?C)   Resp 16   SpO2 94%  ?Gen:   Awake, no distress   ?Resp:  Normal effort  ?MSK:   Moves extremities without difficulty  ?Other:  Course lungs worse on right. On 2 L O2. tachycardic ? ?Medical Decision Making  ?Medically screening exam initiated at 7:13 PM.  Appropriate orders placed.  Jacqueline [name] was informed that the remainder of the evaluation will be completed by another provider, this initial triage assessment does not replace that evaluation, and the importance of remaining in the ED until their evaluation is complete. ? ?Concern for PNA. Now on 2 L O2.  ?  Sheila Oats ?10/22/21 1915 ? ?

## 2021-10-22 NOTE — Assessment & Plan Note (Addendum)
this patient has acute respiratory failure with Hypoxia as documented by the presence of following: ?O2 saturatio< 90% on RA Likely due VP:CHEKBTCYEL vs early viral pNA ?Provide O2 therapy and titrate as needed ? Continuous pulse ox ? check Pulse ox with ambulation prior to discharge ? may need  TC consult for home O2 set up ? flutter valve ordered ?No CP, pt is intermittently tachycardic in the setting of A.fib ?Already on Eliquis so PE seems less likely ?D.dimer pending ? ?

## 2021-10-22 NOTE — Assessment & Plan Note (Signed)
possilby rate induced st changes,  ?No CP but endorsing pressure and tightness ?Slightly elevated troponin ?Will discuss with cardiology  ?

## 2021-10-22 NOTE — Assessment & Plan Note (Addendum)
-   fluid status appears clinicaly euvolemic at his time ? ?admit on telemetry,  ?cycle cardiac enzymes, Troponin 27 ?  Last BNP BNP (last 3 results) ?Recent Labs  ?  10/22/21 ?1928  ?BNP 435.4*  ?gotten a dose of Lasix in ER will carefully observe ? Order echogram to evaluate EF and valves ?   ?

## 2021-10-22 NOTE — Assessment & Plan Note (Signed)
Restart home meds when able ?

## 2021-10-22 NOTE — Assessment & Plan Note (Signed)
Could be contributing to hypoxia? ?Will order repeat echo ?

## 2021-10-22 NOTE — H&P (Signed)
? ? ?Jacqueline [name] MOQ:[id number] DOB: 1930/12/22 DOA: 10/22/2021 ?  ?PCP: [name], MD   ?Outpatient Specialists:  ?CARDS:  Dr. [name]   ? ?Patient arrived to ER on 10/22/21 at 1837 ?Referred by Attending [name], [name], MD ? ? ?Patient coming from:   ? home Lives alone,  with family nearby ? ? ?Chief Complaint:  ?Chief Complaint  ?Patient presents with  ? Cough  ? Shortness of Breath  ? ? ?HPI: ?Jacqueline [name] is a 88 y.o. female with medical history significant of a.fib on Eliquis, CKD, CAD sp CABG, diastoli CHF, HTN. HLD, chronic fatigue ?  ? ?Presented with shortness of breath ?Cough and shortness of breath denies any nausea vomiting diarrhea ?No fever just feels bad ?Has been feeling extra weak and fatigue ?Has chronic back pain and recently had a flareup.  Was given a course of cortisone by her orthopedics. ? Also describes a bit of chest heaviness, no pleuritic pain , has been complaint with Eliquis ?Reports she is coughing up green sputum, no sore throat, but runny nose ?Low grade fever 100.1, recently was exposed to a small child with a cold ?No appetite ?No leg swelling ?Reports darker urine, mild wheezing she has been using some nebulizer to help ? ? Initial COVID TEST  ?NEGATIVE  ? ?Lab Results  ?Component Value Date  ? [place] NEGATIVE 10/22/2021  ? [place] NEGATIVE 05/25/2020  ? [place] NEGATIVE 01/28/2019  ? [place] NEGATIVE 01/26/2019  ? ?  ?Regarding pertinent Chronic problems:   ? ? Hyperlipidemia -  on statins Provachol ?Lipid Panel  ?   ?Component Value Date/Time  ? CHOL 147 10/20/2021 1107  ? TRIG 139 10/20/2021 1107  ? HDL 62 10/20/2021 1107  ? CHOLHDL 2.4 10/20/2021 1107  ? CHOLHDL 2.8 04/08/2016 1153  ? VLDL 26 04/08/2016 1153  ? [place] 61 10/20/2021 1107  ? LABVLDL 24 10/20/2021 1107  ? ? ? HTN on hydralazine ? ? chronic CHF diastolic  - last echo  EF 55-60%. diastolic dysfunction and elevated left atrial pressures.  ? ?  CAD  - On Aspirin, statin,  ?                -  followed by cardiology ?         ? Hypothyroidism:  ?Lab Results  ?Component Value Date  ? TSH 1.090 10/20/2021  ? on synthroid ?   ? ? A. Fib -  - CHA2DS2 vas score  6   ?  ?  ? current  on anticoagulation with   Eliquis,  ?  ?       ?       - Rhythm control:   amiodarone,   ?  ? CKD stage IIIb- baseline Cr  1.6 ?Estimated Creatinine Clearance: 20.7 mL/min (A) (by C-G formula based on SCr of 1.42 mg/dL (H)). ? ?Lab Results  ?Component Value Date  ? CREATININE 1.42 (H) 10/22/2021  ? CREATININE 1.50 (H) 10/20/2021  ? CREATININE 1.59 (H) 02/08/2021  ?  ?While in ER: ?  ?Chest x-ray unremarkable but noted to have acute hypoxic event requiring 2 L.  Mild level still elevated patient mild BNP elevation ?  ?CXR -  NON acute ?     ? ?Following Medications were ordered in ER: ?Medications  ?furosemide (LASIX) injection 20 mg (20 mg Intravenous Given 10/22/21 2136)  ?  ?  ?  ?ED Triage Vitals  ?Enc Vitals Group  ?  BP 10/22/21 1855 123/82  ?   Pulse Rate 10/22/21 1855 (!) 139  ?   Resp 10/22/21 1855 16  ?   Temp 10/22/21 1855 98.7 ?F (37.1 ?C)  ?   Temp Source 10/22/21 2118 Oral  ?   SpO2 10/22/21 1855 94 %  ?   Weight --   ?   Height --   ?   Head Circumference --   ?   Peak Flow --   ?   Pain Score 10/22/21 1905 0  ?   Pain Loc --   ?   Pain Edu? --   ?   Excl. in [place]? --   ?IWPY(09)@    ? _________________________________________ ?Significant initial  Findings: ?Abnormal Labs Reviewed  ?BASIC METABOLIC PANEL - Abnormal; Notable for the following components:  ?    Result Value  ? Potassium 3.1 (*)   ? Glucose, Bld 128 (*)   ? Creatinine, Ser 1.42 (*)   ? GFR, Estimated 36 (*)   ? All other components within normal limits  ?CBC WITH DIFFERENTIAL/PLATELET - Abnormal; Notable for the following components:  ? WBC 12.1 (*)   ? RBC 3.74 (*)   ? Neutro Abs 10.4 (*)   ? All other components within normal limits  ?BRAIN NATRIURETIC PEPTIDE - Abnormal; Notable for the following components:  ? B Natriuretic Peptide 435.4 (*)    ? All other components within normal limits  ?TROPONIN I (HIGH SENSITIVITY) - Abnormal; Notable for the following components:  ? Troponin I (High Sensitivity) 27 (*)   ? All other components within normal limits  ? ?  ?_________________________ ?Troponin 27 ?ECG: Ordered ?Personally reviewed by me showing: ?HR : 116 ?Rhythm: A.fib. W RVR,   ?Atrial fibrillation with rapid ventricular response ?Marked ST abnormality, possible inferior subendocardial injury ?Abnormal ECG ?When compared with ECG of 08-Feb-2021 22:22, ?QTC 430  ?   ? ?The recent clinical data is shown below. ?Vitals:  ? 10/22/21 1855 10/22/21 2030 10/22/21 2118 10/22/21 2130  ?BP: 123/82 (!) 153/78  (!) 145/79  ?Pulse: (!) 139 (!) 105 79 (!) 112  ?Resp: 16 (!) 21 (!) 23 (!) 21  ?Temp: 98.7 ?F (37.1 ?C)  98.3 ?F (36.8 ?C)   ?TempSrc:   Oral   ?SpO2: 94% 100% 100% 99%  ?  ?  ?WBC ? ?   ?Component Value Date/Time  ? WBC 12.1 (H) 10/22/2021 1928  ? LYMPHSABS 0.9 10/22/2021 1928  ? LYMPHSABS 1.3 01/25/2019 1057  ? MONOABS 0.7 10/22/2021 1928  ? EOSABS 0.1 10/22/2021 1928  ? EOSABS 0.3 01/25/2019 1057  ? BASOSABS 0.0 10/22/2021 1928  ? BASOSABS 0.0 01/25/2019 1057  ?   ?Procalcitonin   Ordered ?Lactic Acid, Venous ?   ?Component Value Date/Time  ? LATICACIDVEN 1.82 05/06/2015 0923  ?  ? ? UA  ordered ?   ? ?Results for orders placed or performed during the hospital encounter of 10/22/21  ?Resp Panel by RT-PCR (Flu A&B, Covid) Nasopharyngeal Swab     Status: None  ? Collection Time: 10/22/21  6:37 PM  ? Specimen: Nasopharyngeal Swab; Nasopharyngeal(NP) swabs in vial transport medium  ?Result Value Ref Range Status  ? SARS Coronavirus 2 by RT PCR NEGATIVE NEGATIVE Final  ?      ? Influenza A by PCR NEGATIVE NEGATIVE Final  ? Influenza B by PCR NEGATIVE NEGATIVE Final  ?      ?  ?_______________________________________________ ?Hospitalist was called for admission for acute respiratory  failure ? ? ?The following Work up has been ordered so far: ? ?Orders Placed  This Encounter  ?Procedures  ? Resp Panel by RT-PCR (Flu A&B, Covid) Nasopharyngeal Swab  ? DG Chest 2 View  ? Basic metabolic panel  ? CBC with Differential  ? Brain natriuretic peptide  ? Cardiac monitoring  ? Consult to hospitalist  ? Pulse oximetry (single)  ? ED EKG  ? EKG 12-Lead  ? Insert peripheral IV  ?  ? ?OTHER Significant initial  Findings: ? ?labs showing: ? ?  ?Recent Labs  ?Lab 10/20/21 ?1107 10/22/21 ?1928  ?NA 142 139  ?K 5.2 3.1*  ?CO2 23 23  ?GLUCOSE 82 128*  ?BUN 22 16  ?CREATININE 1.50* 1.42*  ?CALCIUM 9.2 9.1  ? ? ?Cr    stable,   ?Lab Results  ?Component Value Date  ? CREATININE 1.42 (H) 10/22/2021  ? CREATININE 1.50 (H) 10/20/2021  ? CREATININE 1.59 (H) 02/08/2021  ? ? ?Recent Labs  ?Lab 10/20/21 ?1107  ?AST 21  ?ALT 30  ?ALKPHOS 65  ?BILITOT 0.3  ?PROT 5.9*  ?ALBUMIN 3.8  ? ?Lab Results  ?Component Value Date  ? CALCIUM 9.1 10/22/2021  ? PHOS 4.0 08/14/2015  ?  ?Plt: ?Lab Results  ?Component Value Date  ? PLT 155 10/22/2021  ?  ?  ?COVID-19 Labs ? ?No results for input(s): DDIMER, FERRITIN, LDH, CRP in the last 72 hours. ? ?Lab Results  ?Component Value Date  ? [place] NEGATIVE 10/22/2021  ? [place] NEGATIVE 05/25/2020  ? [place] NEGATIVE 01/28/2019  ? [place] NEGATIVE 01/26/2019  ? ?   ?Recent Labs  ?Lab 10/20/21 ?1107 10/22/21 ?1928  ?WBC 9.0 12.1*  ?NEUTROABS  --  10.4*  ?HGB 11.3 12.2  ?HCT 34.3 36.2  ?MCV 97 96.8  ?PLT 176 155  ? ? ?HG/HCT  stable,    ?   ?Component Value Date/Time  ? HGB 12.2 10/22/2021 1928  ? HGB 11.3 10/20/2021 1107  ? HGB 9.6 08/06/2014 0000  ? HCT 36.2 10/22/2021 1928  ? HCT 34.3 10/20/2021 1107  ? MCV 96.8 10/22/2021 1928  ? MCV 97 10/20/2021 1107  ?  ? ?Cardiac Panel (last 3 results) ?No results for input(s): CKTOTAL, CKMB, TROPONINI, RELINDX in the last 72 hours. ? .car ?BNP (last 3 results) ?Recent Labs  ?  10/22/21 ?1928  ?BNP 435.4*  ?   ?    ?Cultures: ?   ?Component Value Date/Time  ? SDES URINE, RANDOM 05/25/2020 2048  ? SPECREQUEST   05/25/2020 2048  ?  NONE ?Performed at [facility], [place] [address] [place], [place] [address] ?  ? CULT >=100,000 COLONIES/mL KLEBSIELLA PNEUMONIAE (A) 05/25/2020 2048  ? REPTSTATUS 05/28/2020 FIN

## 2021-10-22 NOTE — Assessment & Plan Note (Signed)
Only tolerates Pravachol not on aspirin because she is on Eliquis ?

## 2021-10-22 NOTE — H&P (Incomplete)
? ? ?Jacqueline [name] OAC:[id number] DOB: August 17, 1930 DOA: 10/22/2021 ?  ?  ?PCP: [name], MD   ?Outpatient Specialists: * NONE ?CARDS: * Dr. ?NEphrology: *  Dr. ?NEurology *   Dr. ?Pulmonary *  Dr. ? Oncology * Dr. ?GI* Dr.  Sadie Haber, [facility]) ?No care team member to display ?Urology Dr. Marland Kitchen ? ?Patient arrived to ER on 10/22/21 at 1837 ?Referred by Attending [name], [name], MD ? ? ?Patient coming from:   ? home Lives alone,   *** With family ?From facility *** ? ?Chief Complaint:   ?Chief Complaint  ?Patient presents with  ? Cough  ? Shortness of Breath  ? ? ?HPI: ?Jacqueline [name] is a 88 y.o. female with medical history significant of ***  ?  ? ?Presented with   productive cough, SHOB, HA since yesterday sudden since yesterday ?No new subjective & objective note has been filed under this hospital service since the last note was generated. ? ?  ? ? Initial COVID TEST  ?NEGATIVE**** POSITIVE,  ?***in house  PCR testing  Pending ? ?Lab Results  ?Component Value Date  ? [place] NEGATIVE 10/22/2021  ? [place] NEGATIVE 05/25/2020  ? [place] NEGATIVE 01/28/2019  ? [place] NEGATIVE 01/26/2019  ? ?  ?Regarding pertinent Chronic problems: *** ? ?****Hyperlipidemia - *on statins ?Lipid Panel  ?   ?Component Value Date/Time  ? CHOL 147 10/20/2021 1107  ? TRIG 139 10/20/2021 1107  ? HDL 62 10/20/2021 1107  ? CHOLHDL 2.4 10/20/2021 1107  ? CHOLHDL 2.8 04/08/2016 1153  ? VLDL 26 04/08/2016 1153  ? [place] 61 10/20/2021 1107  ? LABVLDL 24 10/20/2021 1107  ? ? ?***HTN on  ? ?***chronic CHF diastolic/systolic/ combined - last echo*** ? ?*** CAD  - On Aspirin, statin, betablocker, Plavix  ?               - *followed by cardiology ?               - last cardiac cath  ?The ASCVD Risk score (Arnett DK, et al., 2019) failed to calculate for the following reasons: ?  The 2019 ASCVD risk score is only valid for ages 48 to 58 ? ? ? ***DM 2 -  ?Lab Results  ?Component Value Date  ? HGBA1C 5.8 (H) 07/23/2017  ? ****on insulin,  PO meds only, diet controlled ? ?***Hypothyroidism:  ?Lab Results  ?Component Value Date  ? TSH 1.090 10/20/2021  ? on synthroid ? ?*** Morbid obesity-   ?BMI Readings from Last 1 Encounters:  ?10/20/21 23.54 kg/m?  ? ?  ?*** Asthma -well *** controlled on home inhalers/ nebs f ?                       ***last no prior***admission  *** ?                      No ***history of intubation ? ?*** COPD - not **followed by pulmonology *** not  on baseline oxygen  *L,   ? ?*** OSA -on nocturnal oxygen, *CPAP, *noncompliant with CPAP ? ?*** Hx of CVA - *with/out residual deficits on Aspirin 81 mg, 325, Plavix ? ?***A. Fib -  - CHA2DS2 vas score **** CHA2DS2/VAS Stroke Risk Points  Current as of 7 minutes ago  ?   6 >= 2 Points: High Risk  ?1 - 1.99 Points: Medium Risk  ?0 Points: Low Risk  ?  Last  Change: N/A   ?  ? Details   ? This score determines the patient's risk of having a stroke if the  ?patient has atrial fibrillation.  ?  ?  ? Points Metrics  ?1 Has Congestive Heart Failure:  Yes   ? Current as of 7 minutes ago  ?1 Has Vascular Disease:  Yes   ? Current as of 7 minutes ago  ?1 Has Hypertension:  Yes   ? Current as of 7 minutes ago  ?2 Age:  60   ? Current as of 7 minutes ago  ?0 Has Diabetes:  No   ? Current as of 7 minutes ago  ?0 Had Stroke:  No  Had TIA:  No  Had Thromboembolism:  No   ? Current as of 7 minutes ago  ?1 Female:  Yes   ? Current as of 7 minutes ago  ?  ?  ?  ?  ? current  on anticoagulation with ****Coumadin  ***Xarelto,* Eliquis,  ?*** Not on anticoagulation secondary to Risk of Falls, *** recurrent bleeding  ?       -  Rate control:  Currently controlled with ***Toprolol,  *Metoprolol,* Diltiazem, *Coreg   ?       - Rhythm control: *** amiodarone, *flecainide ? ?***Hx of DVT/PE on - anticoagulation with ****Coumadin  ***Xarelto,* Eliquis,  ? ?***CKD stage III*- baseline Cr **** ?Estimated Creatinine Clearance: 20.7 mL/min (A) (by C-G formula based on SCr of 1.42 mg/dL (H)). ? ?Lab Results   ?Component Value Date  ? CREATININE 1.42 (H) 10/22/2021  ? CREATININE 1.50 (H) 10/20/2021  ? CREATININE 1.59 (H) 02/08/2021  ? ? ? ?**** Liver disease Computed MELD-Na score unavailable. Necessary lab results were not found in the last year. ?Computed MELD score unavailable. Necessary lab results were not found in the last year. ? ? ?***BPH - on Flomax, Proscar ?   ?*** Dementia - on Aricept** Nemenda ? ?*** Chronic anemia - baseline hg Hemoglobin & Hematocrit  ?Recent Labs  ?  02/08/21 ?2239 10/20/21 ?1107 10/22/21 ?1928  ?HGB 11.7* 11.3 12.2  ? ? ? ?While in ER: ?  ? ? ? ? ?Ordered ? ?CT HEAD *** NON acute ? ?CXR - ***NON acute ? ?CTabd/pelvis - ***nonacute ? ?CTA chest - ***nonacute, no PE, * no evidence of infiltrate ? ?Following Medications were ordered in ER: ?Medications  ?furosemide (LASIX) injection 20 mg (20 mg Intravenous Given 10/22/21 2136)  ?  ?_______________________________________________________ ?ER Provider Called:     DrMarland Kitchen  ?They Recommend admit to medicine *** ?Will see in AM  ***SEEN in ER ?  ?ED Triage Vitals  ?Enc Vitals Group  ?   BP 10/22/21 1855 123/82  ?   Pulse Rate 10/22/21 1855 (!) 139  ?   Resp 10/22/21 1855 16  ?   Temp 10/22/21 1855 98.7 ?F (37.1 ?C)  ?   Temp Source 10/22/21 2118 Oral  ?   SpO2 10/22/21 1855 94 %  ?   Weight --   ?   Height --   ?   Head Circumference --   ?   Peak Flow --   ?   Pain Score 10/22/21 1905 0  ?   Pain Loc --   ?   Pain Edu? --   ?   Excl. in [place]? --   ?AUQJ(33)@    ? _________________________________________ ?Significant initial  Findings: ?Abnormal Labs Reviewed  ?BASIC METABOLIC PANEL - Abnormal; Notable for the following  components:  ?    Result Value  ? Potassium 3.1 (*)   ? Glucose, Bld 128 (*)   ? Creatinine, Ser 1.42 (*)   ? GFR, Estimated 36 (*)   ? All other components within normal limits  ?CBC WITH DIFFERENTIAL/PLATELET - Abnormal; Notable for the following components:  ? WBC 12.1 (*)   ? RBC 3.74 (*)   ? Neutro Abs 10.4 (*)   ? All other  components within normal limits  ?BRAIN NATRIURETIC PEPTIDE - Abnormal; Notable for the following components:  ? B Natriuretic Peptide 435.4 (*)   ? All other components within normal limits  ?TROPONIN I (HIGH SENSITIVITY) - Abnormal; Notable for the following components:  ? Troponin I (High Sensitivity) 27 (*)   ? All other components within normal limits  ? ?  ?_________________________ ?Troponin ***ordered ?ECG: Ordered ?Personally reviewed by me showing: ?HR : *** ?Rhythm: *NSR, Sinus tachycardia * A.fib. W RVR, RBBB, LBBB, Paced ?Ischemic changes*nonspecific changes, no evidence of ischemic changes ?QTC* ? ? ?____________________ ?This patient meets SIRS Criteria and may be septic. ?SIRS = Systemic Inflammatory Response Syndrome ? ?Order a lactic acid level if needed AND/OR Initiate the sepsis protocol with the attached order set ?OR ?Click "Treating Associated Infection or Illness" if the patient is being treated for an infection that is a known cause of these abnormalities   ? ? ?The recent clinical data is shown below. ?Vitals:  ? 10/22/21 1855 10/22/21 2030 10/22/21 2118  ?BP: 123/82 (!) 153/78   ?Pulse: (!) 139 (!) 105 79  ?Resp: 16 (!) 21 (!) 23  ?Temp: 98.7 ?F (37.1 ?C)  98.3 ?F (36.8 ?C)  ?TempSrc:   Oral  ?SpO2: 94% 100% 100%  ? ? ? ? ?  ?WBC ? ?   ?Component Value Date/Time  ? WBC 12.1 (H) 10/22/2021 1928  ? LYMPHSABS 0.9 10/22/2021 1928  ? LYMPHSABS 1.3 01/25/2019 1057  ? MONOABS 0.7 10/22/2021 1928  ? EOSABS 0.1 10/22/2021 1928  ? EOSABS 0.3 01/25/2019 1057  ? BASOSABS 0.0 10/22/2021 1928  ? BASOSABS 0.0 01/25/2019 1057  ? ? ?   ? ?Lactic Acid, Venous ?   ?Component Value Date/Time  ? LATICACIDVEN 1.82 05/06/2015 0923  ? ? ? ?Procalcitonin *** Ordered ?Lactic Acid, Venous ?   ?Component Value Date/Time  ? LATICACIDVEN 1.82 05/06/2015 0923  ? ? ? ?Procalcitonin *** Ordered ? ? UA *** no evidence of UTI  ***Pending ***not ordered ?  ?Urine analysis: ?   ?Component Value Date/Time  ? [place] YELLOW  05/25/2020 2046  ? APPEARANCEUR HAZY (A) 05/25/2020 2046  ? LABSPEC 1.013 05/25/2020 2046  ? PHURINE 7.0 05/25/2020 2046  ? GLUCOSEU NEGATIVE 05/25/2020 2046  ? [place] NEGATIVE 05/25/2020 2046  ? BILIRUBINUR n

## 2021-10-22 NOTE — Assessment & Plan Note (Signed)
Continue statin. 

## 2021-10-22 NOTE — Assessment & Plan Note (Signed)
-  no chest pain no EKG changes in the setting of chronic kidney disease likely due to demand ischemia and poor clearance, monitor on telemetry and cycle cardiac enzymes to trend. ? if continues to rise will need further work-up ?Echo in am and repeat ecg ? ?

## 2021-10-22 NOTE — Subjective & Objective (Addendum)
Cough and shortness of breath denies any nausea vomiting diarrhea ?No fever just feels bad ?Has been feeling extra weak and fatigue ?Has chronic back pain and recently had a flareup.  Was given a course of cortisone by her orthopedics. ? ?

## 2021-10-22 NOTE — Assessment & Plan Note (Addendum)
Bronchitis vs early PNA, CXR clear pt has WBC cough and URI symptoms ?ordered respiratory panel, O2, albuterol prn ?Start on azithromycin for now ? ?

## 2021-10-22 NOTE — Assessment & Plan Note (Signed)
-   Check TSH continue home medications at current dose ° °

## 2021-10-22 NOTE — ED Triage Notes (Signed)
Pt c/o productive cough, SHOB, HA since yesterday. Seen recently at PCP for yearly, checked out fine but last night, sudden onset symptoms. Denies N/V/D, around sick baby Friday. UTD on vaccines ?

## 2021-10-22 NOTE — Assessment & Plan Note (Signed)
Pt in the past has responded porly to betablcokers will hold off and continue amiodarone ?

## 2021-10-22 NOTE — Assessment & Plan Note (Addendum)
On eliquis and amiodarone will continue, intermittently tachycardic ?Reports does not tolerate betablocker well  ?Observe if persistently tachy may  Low dose metoprolol bid ?Discussed with Cardiology at night ?ECG appears more of sinus with PAC ?Continue to monitor echo in am  ?email cardiology that pt is here ? ?

## 2021-10-22 NOTE — ED Provider Notes (Addendum)
? ?Emergency Department Provider Note ? ? ?I have reviewed the triage vital signs and the nursing notes. ? ? ?HISTORY ? ?Chief Complaint ?Cough and Shortness of Breath ? ? ?HPI ?Laylynn Campanella is a 88 y.o. female with past medical history reviewed below presents to the emergency department for evaluation of cough, congestion, cold-like symptoms over the past several days.  She has developed some shortness of breath along with headache.  She developed some weakness and saw her PCP but at that time was not having shortness of breath.  No GI symptoms.  No active chest pain.  ? ? ?Past Medical History:  ?Diagnosis Date  ? Arthritis   ? back  ? Atrial fibrillation with rapid ventricular response ([place]) 02/2014  ? a. CHA2DS2VASc = 6 -> on eliquis;  b. 02/2014 s/p DCCV;  c. 08/2014 Echo: EF 55-60%, Gr 2 DD, mild MR, triv AI.  ? CAD (coronary artery disease)   ? a. s/p CABG x 2 (LIMA->LAD, VG->Diag);  b. 08/2014 Cath: LM nl, LAD 90p, LCX nl, RCA nl/dominant, LIMA->LAD atretic, VG->D2 patent w/ retrograde filling of LAD, EF 55-60%-->Med Rx.  ? Diverticulitis 02/21/2012  ? GERD (gastroesophageal reflux disease)   ? Hyperlipemia   ? Hypertension   ? Insomnia   ? ? ?Review of Systems ? ?Constitutional: No fever/chills ?Eyes: No visual changes. ?ENT: No sore throat. ?Cardiovascular: Denies chest pain. ?Respiratory: Positive shortness of breath and cough.  ?Gastrointestinal: No abdominal pain.  No nausea, no vomiting.  No diarrhea.  No constipation. ?Genitourinary: Negative for dysuria. ?Musculoskeletal: Negative for back pain. ?Skin: Negative for rash. ?Neurological: Positive HA.  ? ? ?____________________________________________ ? ? ?PHYSICAL EXAM: ? ?VITAL SIGNS: ?ED Triage Vitals  ?Enc Vitals Group  ?   BP 10/22/21 1855 123/82  ?   Pulse Rate 10/22/21 1855 (!) 139  ?   Resp 10/22/21 1855 16  ?   Temp 10/22/21 1855 98.7 ?F (37.1 ?C)  ?   Temp Source 10/22/21 2118 Oral  ?   SpO2 10/22/21 1855 94 %  ? ? ?Constitutional: Alert and  oriented. Well appearing and in no acute distress. ?Eyes: Conjunctivae are normal.  ?Head: Atraumatic. ?Nose: Mild congestion/rhinnorhea. ?Mouth/Throat: Mucous membranes are moist.  Oropharynx non-erythematous. ?Neck: No stridor.   ?Cardiovascular: A fib. Good peripheral circulation. Grossly normal heart sounds.   ?Respiratory: Normal respiratory effort.  No retractions. Lungs CTAB. ?Gastrointestinal: Soft and nontender. No distention.  ?Musculoskeletal: No lower extremity tenderness. No gross deformities of extremities. ?Neurologic:  Normal speech and language. No gross focal neurologic deficits are appreciated.  ?Skin:  Skin is warm, dry and intact. No rash noted. ? ? ?____________________________________________ ?  ?LABS ?(all labs ordered are listed, but only abnormal results are displayed) ? ?Labs Reviewed  ?BASIC METABOLIC PANEL - Abnormal; Notable for the following components:  ?    Result Value  ? Potassium 3.1 (*)   ? Glucose, Bld 128 (*)   ? Creatinine, Ser 1.42 (*)   ? GFR, Estimated 36 (*)   ? All other components within normal limits  ?CBC WITH DIFFERENTIAL/PLATELET - Abnormal; Notable for the following components:  ? WBC 12.1 (*)   ? RBC 3.74 (*)   ? Neutro Abs 10.4 (*)   ? All other components within normal limits  ?BRAIN NATRIURETIC PEPTIDE - Abnormal; Notable for the following components:  ? B Natriuretic Peptide 435.4 (*)   ? All other components within normal limits  ?URINALYSIS, ROUTINE W REFLEX MICROSCOPIC - Abnormal; Notable  for the following components:  ? Specific Gravity, Urine <1.005 (*)   ? Leukocytes,Ua TRACE (*)   ? All other components within normal limits  ?URINALYSIS, MICROSCOPIC (REFLEX) - Abnormal; Notable for the following components:  ? Bacteria, UA RARE (*)   ? All other components within normal limits  ?TROPONIN I (HIGH SENSITIVITY) - Abnormal; Notable for the following components:  ? Troponin I (High Sensitivity) 27 (*)   ? All other components within normal limits  ?TROPONIN I  (HIGH SENSITIVITY) - Abnormal; Notable for the following components:  ? Troponin I (High Sensitivity) 34 (*)   ? All other components within normal limits  ?RESP PANEL BY RT-PCR (FLU A&B, COVID) ARPGX2  ?RESPIRATORY PANEL BY PCR  ?CULTURE, BLOOD (ROUTINE X 2)  ?CULTURE, BLOOD (ROUTINE X 2)  ?URINE CULTURE  ?EXPECTORATED SPUTUM ASSESSMENT W GRAM STAIN, RFLX TO RESP C  ?PHOSPHORUS  ?MAGNESIUM  ?LACTIC ACID, PLASMA  ?LACTIC ACID, PLASMA  ?PROCALCITONIN  ?CK  ?HEPATIC FUNCTION PANEL  ?D-DIMER, QUANTITATIVE (NOT AT [facility])  ? ?____________________________________________ ? ?EKG ? ? EKG Interpretation ? ?Date/Time:  Wednesday October 22 2021 19:03:22 EDT ?Ventricular Rate:  116 ?PR Interval:    ?QRS Duration: 90 ?QT Interval:  310 ?QTC Calculation: 430 ?R Axis:   56 ?Text Interpretation: Atrial fibrillation with rapid ventricular response Marked ST abnormality, possible inferior subendocardial injury Abnormal ECG When compared with ECG of 08-Feb-2021 22:22, When compared with ECG of 02/08/2021, Atrial fibrillation with rapid ventricular response has replaced Sinus rhythm ST abnormality may be rate-related Confirmed by Delora Fuel ([id number]) on 10/22/2021 11:27:18 PM ?  ? ?  ? ? ?____________________________________________ ? ?RADIOLOGY ? ?DG Chest 2 View ? ?Result Date: 10/22/2021 ?CLINICAL DATA:  Shortness of breath. EXAM: CHEST - 2 VIEW COMPARISON:  05/26/2020 FINDINGS: The cardiac silhouette, mediastinal and hilar contours are within normal limits and stable. Stable tortuosity and calcification of the thoracic aorta. Stable surgical changes from bypass surgery. The lungs are clear of an acute process. No infiltrates, edema or effusions. Again noted is a cystic structure in right lower lobe. No worrisome pulmonary lesions. The bony thorax is intact. IMPRESSION: No acute cardiopulmonary findings. Electronically Signed   By: Marijo Sanes M.D.   On: 10/22/2021 19:56    ? ?____________________________________________ ? ? ?PROCEDURES ? ?Procedure(s) performed:  ? ?Procedures ? ?CRITICAL CARE ?Performed by: Margette Fast ?Total critical care time: 35 minutes ?Critical care time was exclusive of separately billable procedures and treating other patients. ?Critical care was necessary to treat or prevent imminent or life-threatening deterioration. ?Critical care was time spent personally by me on the following activities: development of treatment plan with patient and/or surrogate as well as nursing, discussions with consultants, evaluation of patient's response to treatment, examination of patient, obtaining history from patient or surrogate, ordering and performing treatments and interventions, ordering and review of laboratory studies, ordering and review of radiographic studies, pulse oximetry and re-evaluation of patient's condition. ? ?Nanda Quinton, MD ?Emergency Medicine ? ?____________________________________________ ? ? ?INITIAL IMPRESSION / ASSESSMENT AND PLAN / ED COURSE ? ?Pertinent labs & imaging results that were available during my care of the patient were reviewed by me and considered in my medical decision making (see chart for details). ?  ?This patient is Presenting for Evaluation of SOB, which does require a range of treatment options, and is a complaint that involves a high risk of morbidity and mortality. ? ?The Differential Diagnoses include CAP, COVID, Flu, CHF, ACS, PE. ? ?Critical Interventions-  ?  ?  Medications  ?azithromycin (ZITHROMAX) 500 mg in sodium chloride 0.9 % 250 mL IVPB (500 mg Intravenous New Bag/Given 10/22/21 2336)  ?potassium chloride 10 mEq in 100 mL IVPB (has no administration in time range)  ?furosemide (LASIX) injection 20 mg (20 mg Intravenous Given 10/22/21 2136)  ? ? ?Reassessment after intervention: patient urinating with lasix.  ? ? ?I did obtain Additional Historical Information from family at bedside. ? ?I decided to review pertinent  External Data, and in summary ECHO in 4734 showing diastolic dysfunction, preserved EF. ?  ?Clinical Laboratory Tests Ordered, included patient's CBC includes leukocytosis.  UA is equivocal.  Troponin mildly elevated to 34 and BNP 435.  No significant anemia.  COVI

## 2021-10-23 ENCOUNTER — Inpatient Hospital Stay (HOSPITAL_COMMUNITY): Payer: HMO

## 2021-10-23 DIAGNOSIS — I5032 Chronic diastolic (congestive) heart failure: Secondary | ICD-10-CM | POA: Diagnosis not present

## 2021-10-23 DIAGNOSIS — E785 Hyperlipidemia, unspecified: Secondary | ICD-10-CM | POA: Diagnosis not present

## 2021-10-23 DIAGNOSIS — I2510 Atherosclerotic heart disease of native coronary artery without angina pectoris: Secondary | ICD-10-CM | POA: Diagnosis not present

## 2021-10-23 DIAGNOSIS — J9601 Acute respiratory failure with hypoxia: Secondary | ICD-10-CM | POA: Diagnosis not present

## 2021-10-23 DIAGNOSIS — I5033 Acute on chronic diastolic (congestive) heart failure: Secondary | ICD-10-CM

## 2021-10-23 LAB — RESPIRATORY PANEL BY PCR

## 2021-10-23 LAB — HEPATIC FUNCTION PANEL
ALT: 32 U/L (ref 0–44)
AST: 30 U/L (ref 15–41)
Albumin: 3.6 g/dL (ref 3.5–5.0)
Alkaline Phosphatase: 58 U/L (ref 38–126)
Bilirubin, Direct: 0.1 mg/dL (ref 0.0–0.2)
Indirect Bilirubin: 0.5 mg/dL (ref 0.3–0.9)
Total Bilirubin: 0.6 mg/dL (ref 0.3–1.2)
Total Protein: 6.7 g/dL (ref 6.5–8.1)

## 2021-10-23 LAB — ECHOCARDIOGRAM COMPLETE
Area-P 1/2: 3.85 cm2
Calc EF: 68.2 %
Height: 61 in
S' Lateral: 2.6 cm
Single Plane A2C EF: 67.4 %
Single Plane A4C EF: 65.8 %
Weight: 1865.97 oz

## 2021-10-23 LAB — COMPREHENSIVE METABOLIC PANEL
ALT: 32 U/L (ref 0–44)
AST: 31 U/L (ref 15–41)
Albumin: 3.1 g/dL — ABNORMAL LOW (ref 3.5–5.0)
Alkaline Phosphatase: 50 U/L (ref 38–126)
Anion gap: 9 (ref 5–15)
BUN: 16 mg/dL (ref 8–23)
CO2: 26 mmol/L (ref 22–32)
Calcium: 8.6 mg/dL — ABNORMAL LOW (ref 8.9–10.3)
Chloride: 105 mmol/L (ref 98–111)
Creatinine, Ser: 1.44 mg/dL — ABNORMAL HIGH (ref 0.44–1.00)
GFR, Estimated: 35 mL/min — ABNORMAL LOW (ref >60)
Glucose, Bld: 103 mg/dL — ABNORMAL HIGH (ref 70–99)
Potassium: 4.1 mmol/L (ref 3.5–5.1)
Sodium: 140 mmol/L (ref 135–145)
Total Bilirubin: 0.6 mg/dL (ref 0.3–1.2)
Total Protein: 5.8 g/dL — ABNORMAL LOW (ref 6.5–8.1)

## 2021-10-23 LAB — CBC WITH DIFFERENTIAL/PLATELET
Abs Immature Granulocytes: 0.04 10*3/uL (ref 0.00–0.07)
Basophils Absolute: 0 10*3/uL (ref 0.0–0.1)
Basophils Relative: 0 %
Eosinophils Absolute: 0.1 10*3/uL (ref 0.0–0.5)
Eosinophils Relative: 1 %
HCT: 33 % — ABNORMAL LOW (ref 36.0–46.0)
Hemoglobin: 11.1 g/dL — ABNORMAL LOW (ref 12.0–15.0)
Immature Granulocytes: 0 %
Lymphocytes Relative: 7 %
Lymphs Abs: 0.7 10*3/uL (ref 0.7–4.0)
MCH: 32.2 pg (ref 26.0–34.0)
MCHC: 33.6 g/dL (ref 30.0–36.0)
MCV: 95.7 fL (ref 80.0–100.0)
Monocytes Absolute: 0.8 10*3/uL (ref 0.1–1.0)
Monocytes Relative: 7 %
Neutro Abs: 9.1 10*3/uL — ABNORMAL HIGH (ref 1.7–7.7)
Neutrophils Relative %: 85 %
Platelets: 144 10*3/uL — ABNORMAL LOW (ref 150–400)
RBC: 3.45 MIL/uL — ABNORMAL LOW (ref 3.87–5.11)
RDW: 14.5 % (ref 11.5–15.5)
WBC: 10.7 10*3/uL — ABNORMAL HIGH (ref 4.0–10.5)
nRBC: 0 % (ref 0.0–0.2)

## 2021-10-23 LAB — MAGNESIUM
Magnesium: 2 mg/dL (ref 1.7–2.4)
Magnesium: 1.9 mg/dL (ref 1.7–2.4)

## 2021-10-23 LAB — PROCALCITONIN: Procalcitonin: 0.15 ng/mL

## 2021-10-23 LAB — MRSA NEXT GEN BY PCR, NASAL: MRSA by PCR Next Gen: NOT DETECTED

## 2021-10-23 LAB — TSH: TSH: 1.353 u[IU]/mL (ref 0.350–4.500)

## 2021-10-23 LAB — PHOSPHORUS
Phosphorus: 4.3 mg/dL (ref 2.5–4.6)
Phosphorus: 4.7 mg/dL — ABNORMAL HIGH (ref 2.5–4.6)

## 2021-10-23 LAB — LACTIC ACID, PLASMA
Lactic Acid, Venous: 2.2 mmol/L (ref 0.5–1.9)
Lactic Acid, Venous: 2.7 mmol/L (ref 0.5–1.9)

## 2021-10-23 LAB — TROPONIN I (HIGH SENSITIVITY)
Troponin I (High Sensitivity): 47 ng/L — ABNORMAL HIGH (ref <18)
Troponin I (High Sensitivity): 50 ng/L — ABNORMAL HIGH (ref <18)

## 2021-10-23 LAB — BRAIN NATRIURETIC PEPTIDE: B Natriuretic Peptide: 477 pg/mL — ABNORMAL HIGH (ref 0.0–100.0)

## 2021-10-23 LAB — CK: Total CK: 42 U/L (ref 38–234)

## 2021-10-23 LAB — C-REACTIVE PROTEIN: CRP: 14.5 mg/dL — ABNORMAL HIGH (ref <1.0)

## 2021-10-23 MED ORDER — ACETAMINOPHEN 650 MG RE SUPP: 650.0000 mg | Freq: Four times a day (QID) | RECTAL | Status: DC | PRN

## 2021-10-23 MED ORDER — SODIUM CHLORIDE 0.9 % IV SOLN: 75.0000 mL/h | INTRAVENOUS | Status: DC

## 2021-10-23 MED ORDER — TRAZODONE HCL 100 MG PO TABS
100.0000 mg | ORAL_TABLET | Freq: Every evening | ORAL | Status: DC | PRN
  Administered 2021-10-23: 100 mg via ORAL
  Filled 2021-10-23: qty 2

## 2021-10-23 MED ORDER — HYDROCODONE-ACETAMINOPHEN 5-325 MG PO TABS: 1.0000 | ORAL_TABLET | ORAL | Status: DC | PRN

## 2021-10-23 MED ORDER — MIDODRINE HCL 5 MG PO TABS: 2.5000 mg | ORAL_TABLET | Freq: Every day | ORAL | Status: DC | PRN

## 2021-10-23 MED ORDER — ROPINIROLE HCL 0.5 MG PO TABS
0.2500 mg | ORAL_TABLET | Freq: Three times a day (TID) | ORAL | Status: DC
  Administered 2021-10-23 – 2021-10-24 (×4): 0.25 mg via ORAL
  Filled 2021-10-23 (×4): qty 1

## 2021-10-23 MED ORDER — ADULT MULTIVITAMIN W/MINERALS CH
1.0000 | ORAL_TABLET | Freq: Every day | ORAL | Status: DC
  Administered 2021-10-23 – 2021-10-24 (×2): 1 via ORAL
  Filled 2021-10-23 (×2): qty 1

## 2021-10-23 MED ORDER — NITROGLYCERIN 0.4 MG SL SUBL: 0.4000 mg | SUBLINGUAL_TABLET | SUBLINGUAL | Status: DC | PRN

## 2021-10-23 MED ORDER — FUROSEMIDE 10 MG/ML IJ SOLN
40.0000 mg | Freq: Once | INTRAMUSCULAR | Status: AC
  Administered 2021-10-23: 40 mg via INTRAVENOUS
  Filled 2021-10-23: qty 4

## 2021-10-23 MED ORDER — GUAIFENESIN ER 600 MG PO TB12
600.0000 mg | ORAL_TABLET | Freq: Two times a day (BID) | ORAL | Status: DC
  Administered 2021-10-23 – 2021-10-24 (×4): 600 mg via ORAL
  Filled 2021-10-23 (×4): qty 1

## 2021-10-23 MED ORDER — HYDROCOD POLI-CHLORPHE POLI ER 10-8 MG/5ML PO SUER
5.0000 mL | Freq: Two times a day (BID) | ORAL | Status: DC | PRN
  Administered 2021-10-23: 5 mL via ORAL
  Filled 2021-10-23: qty 5

## 2021-10-23 MED ORDER — ALPRAZOLAM 0.5 MG PO TABS
0.5000 mg | ORAL_TABLET | Freq: Every evening | ORAL | Status: DC | PRN
  Administered 2021-10-23 (×2): 0.5 mg via ORAL
  Filled 2021-10-23: qty 2
  Filled 2021-10-23: qty 1

## 2021-10-23 MED ORDER — POLYETHYLENE GLYCOL 3350 17 G PO PACK: 17.0000 g | PACK | Freq: Every day | ORAL | Status: DC | PRN

## 2021-10-23 MED ORDER — PRAVASTATIN SODIUM 40 MG PO TABS
40.0000 mg | ORAL_TABLET | Freq: Every day | ORAL | Status: DC
  Administered 2021-10-23 – 2021-10-24 (×2): 40 mg via ORAL
  Filled 2021-10-23 (×2): qty 1

## 2021-10-23 MED ORDER — LEVOTHYROXINE SODIUM 75 MCG PO TABS
75.0000 ug | ORAL_TABLET | Freq: Every day | ORAL | Status: DC
  Administered 2021-10-23 – 2021-10-24 (×2): 75 ug via ORAL
  Filled 2021-10-23 (×2): qty 1

## 2021-10-23 MED ORDER — ALBUTEROL SULFATE (2.5 MG/3ML) 0.083% IN NEBU: 2.5000 mg | INHALATION_SOLUTION | RESPIRATORY_TRACT | Status: DC | PRN

## 2021-10-23 MED ORDER — SODIUM CHLORIDE 0.9 % IV SOLN
INTRAVENOUS | Status: DC
Start: 2021-10-23 — End: 2021-10-23

## 2021-10-23 MED ORDER — AMIODARONE HCL 200 MG PO TABS
200.0000 mg | ORAL_TABLET | Freq: Every day | ORAL | Status: DC
  Administered 2021-10-23 – 2021-10-24 (×2): 200 mg via ORAL
  Filled 2021-10-23 (×2): qty 1

## 2021-10-23 MED ORDER — ACETAMINOPHEN 325 MG PO TABS
650.0000 mg | ORAL_TABLET | Freq: Four times a day (QID) | ORAL | Status: DC | PRN
  Administered 2021-10-23: 650 mg via ORAL
  Filled 2021-10-23: qty 2

## 2021-10-23 MED ORDER — CYCLOSPORINE 0.05 % OP EMUL
1.0000 [drp] | Freq: Two times a day (BID) | OPHTHALMIC | Status: DC
  Administered 2021-10-23 – 2021-10-24 (×4): 1 [drp] via OPHTHALMIC
  Filled 2021-10-23 (×5): qty 30

## 2021-10-23 MED ORDER — GABAPENTIN 100 MG PO CAPS
100.0000 mg | ORAL_CAPSULE | Freq: Three times a day (TID) | ORAL | Status: DC
  Administered 2021-10-23 – 2021-10-24 (×4): 100 mg via ORAL
  Filled 2021-10-23 (×4): qty 1

## 2021-10-23 MED ORDER — ENSURE ENLIVE PO LIQD
237.0000 mL | Freq: Two times a day (BID) | ORAL | Status: DC
  Administered 2021-10-24: 237 mL via ORAL

## 2021-10-23 MED ORDER — TRAMADOL HCL 50 MG PO TABS: 50.0000 mg | ORAL_TABLET | Freq: Two times a day (BID) | ORAL | Status: DC | PRN

## 2021-10-23 MED ORDER — PANTOPRAZOLE SODIUM 40 MG PO TBEC
40.0000 mg | DELAYED_RELEASE_TABLET | Freq: Every day | ORAL | Status: DC
  Administered 2021-10-23 – 2021-10-24 (×2): 40 mg via ORAL
  Filled 2021-10-23 (×2): qty 1

## 2021-10-23 MED ORDER — APIXABAN 2.5 MG PO TABS
2.5000 mg | ORAL_TABLET | Freq: Two times a day (BID) | ORAL | Status: DC
  Administered 2021-10-23 – 2021-10-24 (×4): 2.5 mg via ORAL
  Filled 2021-10-23 (×4): qty 1

## 2021-10-23 NOTE — Consult Note (Addendum)
?Cardiology Consultation:  ? ?Patient ID: Jacqueline [name] ?MRN: [id number]; DOB: [birth year] ? ?Admit date: 10/22/2021 ?Date of Consult: 10/23/2021 ? ?PCP:  [name], MD ?  ?[facility] ?Cardiologist:  Sanda Klein, MD  ? ?Patient Profile:  ? ?Jacqueline [name] is a 88 y.o. female with a hx of hypertension, orthostatic hypotension, PAF/flutter, chronic anticoagulation, CAD s/p CABG, HFpEF, hyperlipidemia who is being seen 10/23/2021 for the evaluation of Afib RVR at the request of Dr. Candiss Norse. ? ?History of Present Illness:  ? ?Jacqueline [name] has a history of CAD with CAB in 2006, PAF with prior cardioversion now on amiodarone and eliquis, and chronic diastolic heart failure. She has a history of both hypertension and orthostatic hypotension.  ? ?She was last seen in the office by Jacqueline [name] 06/17/21 and was doing well at that time. She has historically not tolerated BB or higher doses of amiodarone - has been stable on 200 mg amio daily. Antianginals previously stopped due to orthostatic hypotension and falls. It was noted that diuretic therapy for HFpEF limited due to hypotension and falls. She has PRN midodrine at home.  ? ?Our office was contacted 09/2021 for elevated BP on 25 mg TID hydralazine. Hydralazine was increased to 37.5 g TID with instructions to hold for SBP less than 130. ? ?She presented to [name] with cough and shortness of breath. She has chronic back pain and was recently treated with a course of cortisone by ortho. She presented with cough productive of green sputum and febrile to 100.1. She became hypoxic requiring 2L [place]. COVID and flu negative, positive for rhinovirus.  ? ?BNP mildly elevated at 435 ?HST 27 --> 34 --> 47 --> 50 ?CRP 14.5 ?Lactic acid 2.7 ?CXR with no overt signs of heart failure ? ?She was treated with one dose of 40 mg IV lasix.  ?Telemetry shows sinus with bouts of Afib with rates ranging from 60s-110s. Cardiology was consulted.  ? ?She is generally  unaware of her Afib. She denies chest pain and hypervolemia, although she does feel much better after 2 doses of lasix. She does describe a cortisone shot in her hip 2 weeks ago.  ? ? ?Past Medical History:  ?Diagnosis Date  ? Arthritis   ? back  ? Atrial fibrillation with rapid ventricular response ([place]) 02/2014  ? a. CHA2DS2VASc = 6 -> on eliquis;  b. 02/2014 s/p DCCV;  c. 08/2014 Echo: EF 55-60%, Gr 2 DD, mild MR, triv AI.  ? CAD (coronary artery disease)   ? a. s/p CABG x 2 (LIMA->LAD, VG->Diag);  b. 08/2014 Cath: LM nl, LAD 90p, LCX nl, RCA nl/dominant, LIMA->LAD atretic, VG->D2 patent w/ retrograde filling of LAD, EF 55-60%-->Med Rx.  ? Diverticulitis 02/21/2012  ? GERD (gastroesophageal reflux disease)   ? Hyperlipemia   ? Hypertension   ? Insomnia   ? ? ?Past Surgical History:  ?Procedure Laterality Date  ? ABDOMINAL HYSTERECTOMY  1975  ? CARDIAC CATHETERIZATION N/A 05/09/2015  ? Procedure: Right Heart Cath;  Surgeon: Larey Dresser, MD;  Location: [place] [facility];  Service: Cardiovascular;  Laterality: N/A;  ? CARDIOVERSION N/A 02/28/2014  ? Procedure: CARDIOVERSION;  Surgeon: Sanda Klein, MD;  Location: [place] [facility];  Service: Cardiovascular;  Laterality: N/A;  ? CARDIOVERSION N/A 01/30/2019  ? Procedure: CARDIOVERSION;  Surgeon: Fay Records, MD;  Location: [place];  Service: Cardiovascular;  Laterality: N/A;  ? CATARACT EXTRACTION Bilateral 2014  ? with lens implanted  ? COLONOSCOPY N/A  02/19/2015  ? Procedure: COLONOSCOPY;  Surgeon: Ladene Artist, MD;  Location: [facility] ENDOSCOPY;  Service: Endoscopy;  Laterality: N/A;  ? CORONARY ARTERY BYPASS GRAFT  2006  ? off-pump bypass surgery with LIMA to the LAD and SVG to the second diagonal artery ( performed by Dr Servando Snare)   ? JOINT REPLACEMENT Bilateral   ? L-2004, R-2006  ? LEFT HEART CATHETERIZATION WITH CORONARY/GRAFT ANGIOGRAM N/A 09/17/2014  ? Procedure: LEFT HEART CATHETERIZATION WITH Beatrix Fetters;  Surgeon: [place] Sine, MD;  Location:  [name] CATH LAB;  Service: Cardiovascular;  Laterality: N/A;  ? REIMPLANTATION OF TOTAL KNEE Right 10/08/2014  ? Procedure: REIMPLANTATION OF RIGHT TOTAL KNEE ARTHROPLASTY WITH REMOVAL OF ANTIBIOTIC SPACER;  Surgeon: Paralee Cancel, MD;  Location: [facility];  Service: Orthopedics;  Laterality: Right;  ? ROTATOR CUFF REPAIR Bilateral   ? r-1999, l- 2005  ? TEE WITHOUT CARDIOVERSION N/A 02/28/2014  ? Procedure: TRANSESOPHAGEAL ECHOCARDIOGRAM (TEE);  Surgeon: Sanda Klein, MD;  Location: [place];  Service: Cardiovascular;  Laterality: N/A;  ? TEE WITHOUT CARDIOVERSION N/A 01/30/2019  ? Procedure: TRANSESOPHAGEAL ECHOCARDIOGRAM (TEE);  Surgeon: Fay Records, MD;  Location: [place];  Service: Cardiovascular;  Laterality: N/A;  ? TOTAL KNEE ARTHROPLASTY Right 07/02/2014  ? Procedure: Resection of Infected Right Total Knee Arthroplasty with placement antibiotic spacer;  Surgeon: Mauri Pole, MD;  Location: [facility];  Service: Orthopedics;  Laterality: Right;  ?  ? ?Home Medications:  ?Prior to Admission medications   ?Medication Sig Start Date End Date Taking? Authorizing Provider  ?acetaminophen (TYLENOL) 500 MG tablet Take 1,000 mg by mouth every 6 (six) hours as needed for moderate pain or headache (pain/headache.).   Yes [provider]  ?ALPRAZolam (XANAX) 0.5 MG tablet TAKE 1 TABLET BY MOUTH TWICE A DAY AS NEEDED FOR ANXIETY ?Patient taking differently: Take 0.5 mg by mouth at bedtime. 06/23/21  Yes Hensel, Jamal Collin, MD  ?amiodarone (PACERONE) 200 MG tablet TAKE 1 TABLET BY MOUTH EVERY DAY ?Patient taking differently: Take 200 mg by mouth daily. 03/05/21  Yes Hensel, Jamal Collin, MD  ?apixaban (ELIQUIS) 2.5 MG TABS tablet Take 1 tablet (2.5 mg total) by mouth 2 (two) times daily. 06/17/21  Yes Warren Lacy, PA-C  ?cholecalciferol (VITAMIN D3) 25 MCG (1000 UNIT) tablet Take 1,000 Units by mouth daily.   Yes [provider]  ?cycloSPORINE (RESTASIS) 0.05 % ophthalmic emulsion Place 1 drop into  both eyes 2 (two) times daily.   Yes [provider]  ?gabapentin (NEURONTIN) 100 MG capsule TAKE 1 CAPSULE (100 MG TOTAL) BY MOUTH 3 (THREE) TIMES DAILY. 05/19/21  Yes Hensel, Jamal Collin, MD  ?hydrALAZINE (APRESOLINE) 25 MG tablet Take 1.5 tablets (37.5 mg total) by mouth 3 (three) times daily. 09/26/21  Yes Croitoru, Mihai, MD  ?levothyroxine (SYNTHROID) 75 MCG tablet TAKE 1 TABLET BY MOUTH EVERY DAY ?Patient taking differently: Take 75 mcg by mouth daily before breakfast. 03/05/21  Yes Hensel, Jamal Collin, MD  ?meclizine (ANTIVERT) 25 MG tablet Take 25 mg by mouth 3 (three) times daily as needed for dizziness.   Yes [provider]  ?midodrine (PROAMATINE) 2.5 MG tablet TAKE 1 TABLET BY MOUTH IN MORNING AND 1 TAB 6 HOURS LATER, DO NOT LAY FLAT FOR 4 HOURS AFTER TAKING THE MEDICATION. DO NOT TAKE FOR A SYSTOLIC OF 956 OF ABOVE. ?Patient taking differently: Take 2.5 mg by mouth daily as needed (if blood pressure is 130 or below). 02/28/21  Yes Croitoru, Dani Gobble, MD  ?Multiple Vitamin (  MULTIVITAMIN WITH MINERALS) TABS tablet Take 1 tablet by mouth daily.   Yes [provider]  ?nitroGLYCERIN (NITROSTAT) 0.4 MG SL tablet PLACE 1 TABLET UNDER THE TONGUE EVERY 5 MINUTES AS NEEDED FOR CHEST PAIN UP TO 3 DOSES ?Patient taking differently: Place 0.4 mg under the tongue every 5 (five) minutes as needed for chest pain. 12/24/20  Yes Croitoru, Mihai, MD  ?Omega-3 Fatty Acids (FISH OIL) 1000 MG CAPS Take 1,000 mg by mouth every evening.   Yes [provider]  ?omeprazole (PRILOSEC) 20 MG capsule TAKE 1 CAPSULE BY MOUTH EVERY DAY ?Patient taking differently: Take 20 mg by mouth daily. 12/12/20  Yes Hensel, Jamal Collin, MD  ?Polyethyl Glycol-Propyl Glycol (LUBRICANT EYE DROPS) 0.4-0.3 % SOLN Place 1 drop into both eyes 4 (four) times daily.   Yes [provider]  ?polyethylene glycol powder ([employer] PURELAX) 17 GM/SCOOP powder MIX 17 G AND TAKE BY MOUTH DAILY. ?Patient taking differently: Take 17 g by  mouth daily as needed for mild constipation. 07/03/21  Yes Hensel, Jamal Collin, MD  ?pravastatin (PRAVACHOL) 40 MG tablet TAKE 1 TABLET BY MOUTH EVERY DAY IN THE EVENING ?Patient taking differently: Take 40 mg b

## 2021-10-23 NOTE — Progress Notes (Signed)
?  Echocardiogram ?2D Echocardiogram has been performed. ? ?Jacqueline [name] ?10/23/2021, 3:30 PM ?

## 2021-10-23 NOTE — Evaluation (Signed)
Physical Therapy Evaluation ?Patient Details ?Name: Jacqueline [name] [facility] ?MRN: [id number] ?DOB: [birth year] ?Today's Date: 10/23/2021 ? ?History of Present Illness ? 88 yo admitted 3/29 with SOB and acute respiratory failure. PMhx: Afib on Eliquis, HTN, HLD, CAD s/p CABG, Rt TKA, CKD, Rt TKA, chronic fatigue  ?Clinical Impression ? Pt pleasant and reports living alone with assist of family and neighbors. Pt uses a cane outside and no  ?AD in the house. Pt reports no falls but feeling generally weak for sometime. Pt states she had HHPT previously and does not have interest in this. Pt with decreased balance and activity tolerance who will benefit from acute therapy to maximize mobility and safety.  ? ?SpO2 94-97% on RA   ?   ? ?Recommendations for follow up therapy are one component of a multi-disciplinary discharge planning process, led by the attending physician.  Recommendations may be updated based on patient status, additional functional criteria and insurance authorization. ? ?Follow Up Recommendations No PT follow up ? ?  ?Assistance Recommended at Discharge PRN  ?Patient can return home with the following ? Assist for transportation;Assistance with cooking/housework ? ?  ?Equipment Recommendations None recommended by PT  ?Recommendations for Other Services ?    ?  ?Functional Status Assessment Patient has had a recent decline in their functional status and demonstrates the ability to make significant improvements in function in a reasonable and predictable amount of time.  ? ?  ?Precautions / Restrictions Precautions ?Precautions: Fall  ? ?  ? ?Mobility ? Bed Mobility ?Overal bed mobility: Modified Independent ?  ?  ?  ?  ?  ?  ?  ?  ? ?Transfers ?Overall transfer level: Modified independent ?  ?  ?  ?  ?  ?  ?  ?  ?  ?  ? ?Ambulation/Gait ?Ambulation/Gait assistance: Supervision ?Gait Distance (Feet): 300 Feet ?Assistive device: None ?Gait Pattern/deviations: Decreased stride length, Step-through pattern ?  ?Gait  velocity interpretation: >2.62 ft/sec, indicative of community ambulatory ?  ?General Gait Details: decreased stride with slightly unsteady gait without physical assist for balance and pt self regulating distance ? ?Stairs ?Stairs: Yes ?Stairs assistance: Modified independent (Device/Increase time) ?Stair Management: One rail Right, Alternating pattern, Forwards ?Number of Stairs: 4 ?  ? ?Wheelchair Mobility ?  ? ?Modified Rankin (Stroke Patients Only) ?  ? ?  ? ?Balance Overall balance assessment: Mild deficits observed, not formally tested ?  ?  ?  ?  ?  ?  ?  ?  ?  ?  ?  ?  ?  ?  ?  ?  ?  ?  ?   ? ? ? ?Pertinent Vitals/Pain Pain Assessment ?Pain Assessment: No/denies pain  ? ? ?Home Living Family/patient expects to be discharged to:: Private residence ?Living Arrangements: Alone ?Available Help at Discharge: Family;Available PRN/intermittently ?Type of Home: House ?Home Access: Stairs to enter ?Entrance Stairs-Rails: Right ?Entrance Stairs-Number of Steps: 4 ?  ?Home Layout: One level ?Home Equipment: Cane - single point;Rollator (4 wheels) ?   ?  ?Prior Function Prior Level of Function : Independent/Modified Independent ?  ?  ?  ?  ?  ?  ?  ?ADLs Comments: housekeeper for cleaning 2x/month, doesn't drive ?  ? ? ?Hand Dominance  ?   ? ?  ?Extremity/Trunk Assessment  ? Upper Extremity Assessment ?Upper Extremity Assessment: Overall WFL for tasks assessed ?  ? ?Lower Extremity Assessment ?Lower Extremity Assessment: Generalized weakness ?  ? ?Cervical / Trunk Assessment ?  Cervical / Trunk Assessment: Normal  ?Communication  ? Communication: No difficulties  ?Cognition Arousal/Alertness: Awake/alert ?Behavior During Therapy: [facility] for tasks assessed/performed ?Overall Cognitive Status: Within Functional Limits for tasks assessed ?  ?  ?  ?  ?  ?  ?  ?  ?  ?  ?  ?  ?  ?  ?  ?  ?  ?  ?  ? ?  ?General Comments   ? ?  ?Exercises    ? ?Assessment/Plan  ?  ?PT Assessment Patient needs continued PT services  ?PT Problem List  Decreased mobility;Decreased balance;Decreased activity tolerance ? ?   ?  ?PT Treatment Interventions Gait training;Therapeutic exercise;Functional mobility training;Therapeutic activities;Patient/family education;DME instruction   ? ?PT Goals (Current goals can be found in the Care Plan section)  ?Acute Rehab PT Goals ?Patient Stated Goal: return to reading and home ?PT Goal Formulation: With patient ?Time For Goal Achievement: 11/06/21 ?Potential to Achieve Goals: Good ? ?  ?Frequency Min 3X/week ?  ? ? ?Co-evaluation   ?  ?  ?  ?  ? ? ?  ?AM-PAC PT "6 Clicks" Mobility  ?Outcome Measure Help needed turning from your back to your side while in a flat bed without using bedrails?: None ?Help needed moving from lying on your back to sitting on the side of a flat bed without using bedrails?: None ?Help needed moving to and from a bed to a chair (including a wheelchair)?: None ?Help needed standing up from a chair using your arms (e.g., wheelchair or bedside chair)?: None ?Help needed to walk in hospital room?: A Little ?Help needed climbing 3-5 steps with a railing? : None ?6 Click Score: 23 ? ?  ?End of Session   ?Activity Tolerance: Patient tolerated treatment well ?Patient left: in chair;with call bell/phone within reach;with chair alarm set ?Nurse Communication: Mobility status ?PT Visit Diagnosis: Other abnormalities of gait and mobility (R26.89) ?  ? ?Time: 5176-1607 ?PT Time Calculation (min) (ACUTE ONLY): 22 min ? ? ?Charges:   PT Evaluation ?$PT Eval Moderate Complexity: 1 Mod ?  ?  ?   ? ? ?Kylar Speelman P, PT ?Acute Rehabilitation Services ?Pager: [phone] ?Office: [phone] ? ? ?Angela Platner B Ashlynne Shetterly ?10/23/2021, 8:06 AM ? ?

## 2021-10-23 NOTE — Evaluation (Signed)
Clinical/Bedside Swallow Evaluation ?Patient Details  ?Name: [facility]qu[name] ?MRN: [id number] ?Date of Birth: 04/28/1931 ? ?Today's Date: 10/23/2021 ?Time: SLP Start Time (ACUTE ONLY): 1937 SLP Stop Time (ACUTE ONLY): 0902 ?SLP Time Calculation (min) (ACUTE ONLY): 12 min ? ?Past Medical History:  ?Past Medical History:  ?Diagnosis Date  ? Arthritis   ? back  ? Atrial fibrillation with rapid ventricular response (Central) 02/2014  ? a. CHA2DS2VASc = 6 -> on eliquis;  b. 02/2014 s/p DCCV;  c. 08/2014 Echo: EF 55-60%, Gr 2 DD, mild MR, triv AI.  ? CAD (coronary artery disease)   ? a. s/p CABG x 2 (LIMA->LAD, VG->Diag);  b. 08/2014 Cath: LM nl, LAD 90p, LCX nl, RCA nl/dominant, LIMA->LAD atretic, VG->D2 patent w/ retrograde filling of LAD, EF 55-60%-->Med Rx.  ? Diverticulitis 02/21/2012  ? GERD (gastroesophageal reflux disease)   ? Hyperlipemia   ? Hypertension   ? Insomnia   ? ?Past Surgical History:  ?Past Surgical History:  ?Procedure Laterality Date  ? ABDOMINAL HYSTERECTOMY  1975  ? CARDIAC CATHETERIZATION N/A 05/09/2015  ? Procedure: Right Heart Cath;  Surgeon: Larey Dresser, MD;  Location: [place] CV LAB;  Service: Cardiovascular;  Laterality: N/A;  ? CARDIOVERSION N/A 02/28/2014  ? Procedure: CARDIOVERSION;  Surgeon: Sanda Klein, MD;  Location: [place] ENDOSCOPY;  Service: Cardiovascular;  Laterality: N/A;  ? CARDIOVERSION N/A 01/30/2019  ? Procedure: CARDIOVERSION;  Surgeon: Fay Records, MD;  Location: [place];  Service: Cardiovascular;  Laterality: N/A;  ? CATARACT EXTRACTION Bilateral 2014  ? with lens implanted  ? COLONOSCOPY N/A 02/19/2015  ? Procedure: COLONOSCOPY;  Surgeon: Ladene Artist, MD;  Location: [facility] ENDOSCOPY;  Service: Endoscopy;  Laterality: N/A;  ? CORONARY ARTERY BYPASS GRAFT  2006  ? off-pump bypass surgery with LIMA to the LAD and SVG to the second diagonal artery ( performed by Dr Servando Snare)   ? JOINT REPLACEMENT Bilateral   ? L-2004, R-2006  ? LEFT HEART CATHETERIZATION WITH CORONARY/GRAFT  ANGIOGRAM N/A 09/17/2014  ? Procedure: LEFT HEART CATHETERIZATION WITH Beatrix Fetters;  Surgeon: Troy Sine, MD;  Location: [facility] CATH LAB;  Service: Cardiovascular;  Laterality: N/A;  ? REIMPLANTATION OF TOTAL KNEE Right 10/08/2014  ? Procedure: REIMPLANTATION OF RIGHT TOTAL KNEE ARTHROPLASTY WITH REMOVAL OF ANTIBIOTIC SPACER;  Surgeon: Paralee Cancel, MD;  Location: [facility];  Service: Orthopedics;  Laterality: Right;  ? ROTATOR CUFF REPAIR Bilateral   ? r-1999, l- 2005  ? TEE WITHOUT CARDIOVERSION N/A 02/28/2014  ? Procedure: TRANSESOPHAGEAL ECHOCARDIOGRAM (TEE);  Surgeon: Sanda Klein, MD;  Location: [place];  Service: Cardiovascular;  Laterality: N/A;  ? TEE WITHOUT CARDIOVERSION N/A 01/30/2019  ? Procedure: TRANSESOPHAGEAL ECHOCARDIOGRAM (TEE);  Surgeon: Fay Records, MD;  Location: [place];  Service: Cardiovascular;  Laterality: N/A;  ? TOTAL KNEE ARTHROPLASTY Right 07/02/2014  ? Procedure: Resection of Infected Right Total Knee Arthroplasty with placement antibiotic spacer;  Surgeon: Mauri Pole, MD;  Location: [facility];  Service: Orthopedics;  Laterality: Right;  ? ?HPI:  ?88 yo admitted 3/29 with SOB and acute respiratory failure. PMhx: Afib on Eliquis, HTN, HLD, CAD s/p CABG, Rt TKA, CKD, Rt TKA, chronic fatigue  ?  ?Assessment / Plan / Recommendation  ?Clinical Impression ? Pt presents with normal oropharyngeal swallow function based on clinical swallow evaluation. She is able to feed herself and easily passed 3 [employer] water challenge.  SLP observed pt consuming pineapple and water.  Swallow judged to be swift without oral retention nor  indication of pharyngeal retention.  CN exam unremarkable and pt denies dysphagia. Pt advised to follow reflux precautions given she takes a PPI and assure she is taking rest breaks if dyspenic with intake.  Recommend continue diet as tolerated. Nursing student and instructor were in the room with SLP.  No SLP follow up indicated, thanks for this  consult. ?SLP Visit Diagnosis: Dysphagia, unspecified (R13.10) ?   ?Aspiration Risk ? No limitations  ?  ?Diet Recommendation Regular;Thin liquid  ? ?Liquid Administration via: Cup;Straw ?Medication Administration: Whole meds with liquid ?Supervision: Patient able to self feed ?Compensations: Slow rate;Small sips/bites ?Postural Changes: Seated upright at 90 degrees;Remain upright for at least 30 minutes after po intake  ?  ?Other  Recommendations Oral Care Recommendations: Oral care BID   ? ?Recommendations for follow up therapy are one component of a multi-disciplinary discharge planning process, led by the attending physician.  Recommendations may be updated based on patient status, additional functional criteria and insurance authorization. ? ?Follow up Recommendations No SLP follow up  ? ? ?  ?Assistance Recommended at Discharge None  ?Functional Status Assessment Patient has not had a recent decline in their functional status  ?Frequency and Duration    ?  ?N/a  ?   ? ?Prognosis    ?N/a ?  ? ?Swallow Study   ?General Date of Onset: 10/23/21 ?HPI: 88 yo admitted 3/29 with SOB and acute respiratory failure. PMhx: Afib on Eliquis, HTN, HLD, CAD s/p CABG, Rt TKA, CKD, Rt TKA, chronic fatigue ?Diet Prior to this Study: Regular;Thin liquids ?Temperature Spikes Noted: No ?Respiratory Status: Nasal cannula ?History of Recent Intubation: No ?Behavior/Cognition: Alert;Cooperative;Pleasant mood ?Oral Cavity Assessment: Within Functional Limits ?Oral Care Completed by SLP: No ?Oral Cavity - Dentition: Dentures, top;Dentures, bottom (pt eating) ?Vision: Functional for self-feeding ?Self-Feeding Abilities: Able to feed self ?Patient Positioning: Upright in chair ?Baseline Vocal Quality: Normal ?Volitional Cough: Strong ?Volitional Swallow: Able to elicit  ?  ?Oral/Motor/Sensory Function Overall Oral Motor/Sensory Function: Within functional limits   ?Ice Chips Ice chips: Not tested   ?Thin Liquid Thin Liquid: Within  functional limits ?Presentation: Cup;Self Fed  ?  ?Nectar Thick Nectar Thick Liquid: Not tested   ?Honey Thick Honey Thick Liquid: Not tested   ?Puree Puree: Not tested   ?Solid ? ? ?  Solid: Within functional limits ?Presentation: Self Fed  ? ?  ? ?Macario Golds ?10/23/2021,9:14 AM ? ?Kathleen Lime, MS CCC SLP ?Acute Rehab Services ?Office [phone] ?Pager [phone] ? ? ?

## 2021-10-23 NOTE — Progress Notes (Signed)
? ?  Patient Saturations on Room Air at Rest = 95% ? ?Patient Saturations on Room Air while Ambulating = 94% ?Jacqueline [name] P, PT ?Acute Rehabilitation Services ?Pager: [phone] ?Office: [phone] ? ? ?

## 2021-10-23 NOTE — Evaluation (Signed)
Occupational Therapy Evaluation ?Patient Details ?Name: Jacqueline [name] ?MRN: [id number] ?DOB: 1931/04/11 ?Today's Date: 10/23/2021 ? ? ?History of Present Illness 88 yo admitted 3/29 with SOB and acute respiratory failure. PMhx: Afib on Eliquis, HTN, HLD, CAD s/p CABG, Rt TKA, CKD, Rt TKA, chronic fatigue  ? ?Clinical Impression ?  ?Pt at PLOF lives alone but has a son who lives behind them and can assist with taking out to md visits and a friend who takes pt out once a week for groceries. Pt uses a cane vs walker at PLOF. Pt reported in session following toilet tass with supervision and ambulation with min guard feeling weak and needing to rest. Pt currently with functional limitations due to the deficits listed below (see OT Problem List).  Pt will benefit from skilled OT to increase their safety and independence with ADL and functional mobility for ADL to facilitate discharge to venue listed below.  ?  ?   ? ?Recommendations for follow up therapy are one component of a multi-disciplinary discharge planning process, led by the attending physician.  Recommendations may be updated based on patient status, additional functional criteria and insurance authorization.  ? ?Follow Up Recommendations ? No OT follow up  ?  ?Assistance Recommended at Discharge PRN  ?Patient can return home with the following Assistance with cooking/housework;Assist for transportation ? ?  ?Functional Status Assessment ? Patient has had a recent decline in their functional status and demonstrates the ability to make significant improvements in function in a reasonable and predictable amount of time.  ?Equipment Recommendations ? None recommended by OT  ?  ?Recommendations for Other Services   ? ? ?  ?Precautions / Restrictions Precautions ?Precautions: Fall ?Restrictions ?Weight Bearing Restrictions: No  ? ?  ? ?Mobility Bed Mobility ?Overal bed mobility: Modified Independent ?  ?  ?  ?  ?  ?  ?  ?  ? ?Transfers ?Overall transfer level:  Modified independent ?Equipment used: None ?  ?  ?  ?  ?  ?  ?  ?General transfer comment: increase in time and decrease in distance ?  ? ?  ?Balance Overall balance assessment: Mild deficits observed, not formally tested ?  ?  ?  ?  ?  ?  ?  ?  ?  ?  ?  ?  ?  ?  ?  ?  ?  ?  ?   ? ?ADL either performed or assessed with clinical judgement  ? ?ADL Overall ADL's : Needs assistance/impaired ?Eating/Feeding: Independent;Sitting ?  ?Grooming: Wash/dry hands;Wash/dry face;Supervision/safety;Standing ?  ?Upper Body Bathing: Modified independent;Sitting ?  ?Lower Body Bathing: Supervison/ safety;Sit to/from stand ?  ?Upper Body Dressing : Modified independent;Sitting ?  ?Lower Body Dressing: Supervision/safety;Sit to/from stand ?  ?Toilet Transfer: Supervision/safety;Cueing for safety;Cueing for sequencing;BSC/3in1 (due to urgency went to [facility]) ?  ?Toileting- Clothing Manipulation and Hygiene: Supervision/safety;Sit to/from stand ?  ?  ?  ?Functional mobility during ADLs: Supervision/safety ?   ? ? ? ?Vision Baseline Vision/History: 1 Wears glasses ?Patient Visual Report: No change from baseline (pt reported they need to go to eye md but had to ca due to this ed stay) ?   ?   ?Perception   ?  ?Praxis   ?  ? ?Pertinent Vitals/Pain Pain Assessment ?Pain Assessment: No/denies pain  ? ? ? ?Hand Dominance Right ?  ?Extremity/Trunk Assessment Upper Extremity Assessment ?Upper Extremity Assessment: RUE deficits/detail ?RUE Deficits / Details: Pt able to complete ADLS with AAROM  of RUE and does not cause pain but limited ER and only 90 degrees of shoulder flexion ?  ?Lower Extremity Assessment ?Lower Extremity Assessment: Defer to PT evaluation ?  ?Cervical / Trunk Assessment ?Cervical / Trunk Assessment: Normal ?  ?Communication Communication ?Communication: No difficulties ?  ?Cognition Arousal/Alertness: Awake/alert ?Behavior During Therapy: [facility] for tasks assessed/performed ?Overall Cognitive Status: Within Functional Limits for  tasks assessed ?  ?  ?  ?  ?  ?  ?  ?  ?  ?  ?  ?  ?  ?  ?  ?  ?  ?  ?  ?General Comments    ? ?  ?Exercises   ?  ?Shoulder Instructions    ? ? ?Home Living Family/patient expects to be discharged to:: Private residence ?Living Arrangements: Alone ?Available Help at Discharge: Family;Available PRN/intermittently ?Type of Home: House ?Home Access: Stairs to enter ?Entrance Stairs-Number of Steps: 4 ?Entrance Stairs-Rails: Right ?Home Layout: One level ?  ?  ?Bathroom Shower/Tub: Tub/shower unit ?  ?Bathroom Toilet: Handicapped height ?  ?  ?Home Equipment: Cane - single point;Rollator (4 wheels) ?  ?  ?  ? ?  ?Prior Functioning/Environment Prior Level of Function : Independent/Modified Independent ?  ?  ?  ?  ?  ?  ?  ?ADLs Comments: housekeeper for cleaning 2x/month, doesn't drive ?  ? ?  ?  ?OT Problem List: Decreased strength;Decreased activity tolerance;Impaired balance (sitting and/or standing);Decreased knowledge of use of DME or AE;Cardiopulmonary status limiting activity ?  ?   ?OT Treatment/Interventions: Self-care/ADL training;Therapeutic exercise;DME and/or AE instruction;Therapeutic activities;Balance training;Patient/family education  ?  ?OT Goals(Current goals can be found in the care plan section) Acute Rehab OT Goals ?Patient Stated Goal: to get rest ?OT Goal Formulation: With patient ?Time For Goal Achievement: 11/06/21 ?Potential to Achieve Goals: Good ?ADL Goals ?Pt Will Transfer to Toilet: with modified independence;ambulating ?Additional ADL Goal #1: Pt will be able to complete 3 ADLS in standing to increase in tolerance for activity  ?OT Frequency: Min 2X/week ?  ? ?Co-evaluation   ?  ?  ?  ?  ? ?  ?AM-PAC OT "6 Clicks" Daily Activity     ?Outcome Measure Help from another person eating meals?: None ?Help from another person taking care of personal grooming?: None ?Help from another person toileting, which includes using toliet, bedpan, or urinal?: A Little ?Help from another person bathing  (including washing, rinsing, drying)?: A Little ?Help from another person to put on and taking off regular upper body clothing?: None ?Help from another person to put on and taking off regular lower body clothing?: A Little ?6 Click Score: 21 ?  ?End of Session Equipment Utilized During Treatment: Gait belt ?Nurse Communication: Mobility status ? ?Activity Tolerance: Patient tolerated treatment well ?Patient left: in bed;with call bell/phone within reach;with bed alarm set ? ?OT Visit Diagnosis: Muscle weakness (generalized) (M62.81)  ?              ?Time: 8502-7741 ?OT Time Calculation (min): 26 min ?Charges:  OT General Charges ?$OT Visit: 1 Visit ?OT Evaluation ?$OT Eval Low Complexity: 1 Low ?OT Treatments ?$Self Care/Home Management : 8-22 mins ? ?Joeseph Amor OTR/L  ?Acute Rehab Services  ?[phone] office number ?[phone] pager number ? ? ?Joeseph Amor ?10/23/2021, 11:00 AM ?

## 2021-10-23 NOTE — Progress Notes (Addendum)
?                                  PROGRESS NOTE                                             ?                                                                                                                     ?                                         ? ? Patient Demographics:  ? ? Jacqueline [name], is a 88 y.o. female, DOB - January 19, 1931, ZOX:[id number] ? ?Outpatient Primary MD for the patient is [name], MD    LOS - 1  Admit date - 10/22/2021   ? ?Chief Complaint  ?Patient presents with  ? Cough  ? Shortness of Breath  ?    ? ?Brief Narrative (HPI from H&P)     88 y.o. female with medical history significant of a.fib on Eliquis, CKD 3B with baseline creatinine 1.6, CAD sp CABG, to the hospital with several day history of low-grade fevers, cough and shortness of breath, she was diagnosed with bronchitis and acute hypoxic respiratory failure and admitted to the hospital. ? ? Subjective:  ? ? [facility] today has, No headache, No chest pain, No abdominal pain - No Nausea, No new weakness tingling or numbness, mild SOB. ? ? Assessment  & Plan :  ? ? ?Acute  hypoxic respiratory failure in a patient with mild bronchitis with evidence of fluid overload likely acute on chronic diastolic CHF. She been placed on oxygen, azithromycin along with IV Lasix, she is already feeling better, will continue to titrate down oxygen.  Advance activity and monitor response.  Echocardiogram has been ordered and pending ? ?Acute on chronic diastolic CHF last EF 81%.  Echo pending.  Gentle Lasix and beta-blocker to continue.  ? ?paroxysmal atrial fibrillation with [place] vas 2 score of 3, history of tachybradycardia syndrome.  Continue low-dose twice daily metoprolol along with oral amiodarone and Eliquis.  Monitor. ?  ?CAD.  No acute issues continue Eliquis and statin for secondary prevention.  Also on beta-blocker. ? ?CKD 3B.  Baseline creatinine close to 1.6.  Monitor with gentle diuresis.    ? ?Hypertension.  Blood pressure usually soft, on low-dose midodrine scheduled.  Monitor. ? ?Dyslipidemia.  On statin continue. ?  ?Hypothyroidism.  On Synthroid, stable TSH. ? ?History of pulmonary hypertension.  Supportive care for now. ? ?Mild bump in troponin in non-ACS pattern with flat trend.  Chest pain-free now likely due to demand mismatch from hypoxia and  increased work of breathing.  Continue beta-blocker Eliquis and statin.  Outpatient cardiology follow-up postdischarge. ? ?   ? ?Condition - Fair ? ?Family Communication  :  None present ? ?Code Status :  Full ? ?Consults  :  None ? ?PUD Prophylaxis : PPI ? ? Procedures  :    ? ?Echocardiogram ? ?   ? ?Disposition Plan  :   ? ?Status is: Inpatient ? ? ?DVT Prophylaxis  :   ? ?apixaban (ELIQUIS) tablet 2.5 mg Start: 10/23/21 0045 ?apixaban (ELIQUIS) tablet 2.5 mg  ?  ? ?Lab Results  ?Component Value Date  ? PLT 144 (L) 10/23/2021  ? ? ?Diet :  ?Diet Order   ? ?       ?  Diet regular Room service appropriate? Yes; Fluid consistency: Thin  Diet effective now       ?  ? ?  ?  ? ?  ?  ? ?Inpatient Medications ? ?Scheduled Meds: ? amiodarone  200 mg Oral Daily  ? apixaban  2.5 mg Oral BID  ? cycloSPORINE  1 drop Both Eyes BID  ? gabapentin  100 mg Oral TID  ? guaiFENesin  600 mg Oral BID  ? levothyroxine  75 mcg Oral Daily  ? pantoprazole  40 mg Oral Daily  ? pravastatin  40 mg Oral Daily  ? rOPINIRole  0.25 mg Oral TID  ? ?Continuous Infusions: ? azithromycin Stopped (10/23/21 0040)  ? ?PRN Meds:.acetaminophen **OR** acetaminophen, albuterol, ALPRAZolam, HYDROcodone-acetaminophen, midodrine, nitroGLYCERIN, polyethylene glycol, traMADol, traZODone ? ?Antibiotics  :   ? ?Anti-infectives (From admission, onward)  ? ? Start     Dose/Rate Route Frequency Ordered Stop  ? 10/22/21 2245  azithromycin (ZITHROMAX) 500 mg in sodium chloride 0.9 % 250 mL IVPB       ? 500 mg ?250 mL/hr over 60 Minutes Intravenous Every 24 hours 10/22/21 2238 10/27/21 2244  ? ?  ? ? ? Time  Spent in minutes  30 ? ? ?Lala Lund M.D on 10/23/2021 at 11:04 AM ? ?To page go to [email or web address]  ? ?[facility] -  Office  [phone] ? ?See all Orders from today for further details ? ? ? Objective:  ? ?Vitals:  ? 10/23/21 0159 10/23/21 0200 10/23/21 0410 10/23/21 0803  ?BP: (!) 126/93  113/62 129/81  ?Pulse: 80  76 79  ?Resp: (!) 24  16 (!) 24  ?Temp: 98.4 ?F (36.9 ?C)  98.4 ?F (36.9 ?C) 97.7 ?F (36.5 ?C)  ?TempSrc: Oral  Oral Oral  ?SpO2: 95%  96% 94%  ?Weight:  52.9 kg    ?Height:  '5\' 1"'$  (1.549 m)    ? ? ?Wt Readings from Last 3 Encounters:  ?10/23/21 52.9 kg  ?10/20/21 56.5 kg  ?06/17/21 58.1 kg  ? ? ? ?Intake/Output Summary (Last 24 hours) at 10/23/2021 1104 ?Last data filed at 10/23/2021 1000 ?Gross per 24 hour  ?Intake 971.5 ml  ?Output 950 ml  ?Net 21.5 ml  ? ? ? ?Physical Exam ? ?Awake Alert, No new F.N deficits, Normal affect ?[name].AT,PERRAL ?Supple Neck, No JVD,   ?Symmetrical Chest wall movement, Good air movement bilaterally, CTAB ?RRR,No Gallops,Rubs or new Murmurs,  ?+ve B.Sounds, Abd Soft, No tenderness,   ?No Cyanosis, Clubbing or edema  ?  ?  ? ? Data Review:  ? ? ?CBC ?Recent Labs  ?Lab 10/20/21 ?1107 10/22/21 ?1928 10/23/21 ?0328  ?WBC 9.0 12.1* 10.7*  ?HGB 11.3 12.2 11.1*  ?HCT 34.3 36.2 33.0*  ?  PLT 176 155 144*  ?MCV 97 96.8 95.7  ?MCH 31.9 32.6 32.2  ?MCHC 32.9 33.7 33.6  ?RDW 12.7 14.6 14.5  ?LYMPHSABS  --  0.9 0.7  ?MONOABS  --  0.7 0.8  ?EOSABS  --  0.1 0.1  ?BASOSABS  --  0.0 0.0  ? ? ?Electrolytes ?Recent Labs  ?Lab 10/20/21 ?1107 10/22/21 ?1928 10/22/21 ?2310 10/23/21 ?2951 10/23/21 ?0328  ?NA 142 139  --   --  140  ?K 5.2 3.1*  --   --  4.1  ?CL 108* 106  --   --  105  ?CO2 23 23  --   --  26  ?GLUCOSE 82 128*  --   --  103*  ?BUN 22 16  --   --  16  ?CREATININE 1.50* 1.42*  --   --  1.44*  ?CALCIUM 9.2 9.1  --   --  8.6*  ?AST 21  --  30  --  31  ?ALT 30  --  32  --  32  ?ALKPHOS 65  --  58  --  50  ?BILITOT 0.3  --  0.6  --  0.6  ?ALBUMIN 3.8  --  3.6  --  3.1*  ?MG  --    --  2.0  --  1.9  ?CRP  --   --   --   --  14.5*  ?DDIMER  --  0.49  --   --   --   ?PROCALCITON  --   --  0.15  --   --   ?LATICACIDVEN  --   --  2.7* 2.2*  --   ?TSH 1.090  --   --   --  1.353  ?BNP  --  435.4*  --   --  477.0*  ? ? ?------------------------------------------------------------------------------------------------------------------ ?Recent Labs  ?  10/20/21 ?1107  ?CHOL 147  ?HDL 62  ?[place] 61  ?TRIG 139  ?CHOLHDL 2.4  ? ? ?Lab Results  ?Component Value Date  ? HGBA1C 5.8 (H) 07/23/2017  ? ? ?Recent Labs  ?  10/23/21 ?0328  ?TSH 1.353  ? ?------------------------------------------------------------------------------------------------------------------ ?ID Labs ?Recent Labs  ?Lab 10/20/21 ?1107 10/22/21 ?1928 10/22/21 ?2310 10/23/21 ?8841 10/23/21 ?0328  ?WBC 9.0 12.1*  --   --  10.7*  ?PLT 176 155  --   --  144*  ?CRP  --   --   --   --  14.5*  ?DDIMER  --  0.49  --   --   --   ?PROCALCITON  --   --  0.15  --   --   ?LATICACIDVEN  --   --  2.7* 2.2*  --   ?CREATININE 1.50* 1.42*  --   --  1.44*  ? ?Cardiac Enzymes ?No results for input(s): CKMB, TROPONINI, MYOGLOBIN in the last 168 hours. ? ?Invalid input(s): CK ? ? ?Radiology Reports ?DG Chest 2 View ? ?Result Date: 10/22/2021 ?CLINICAL DATA:  Shortness of breath. EXAM: CHEST - 2 VIEW COMPARISON:  05/26/2020 FINDINGS: The cardiac silhouette, mediastinal and hilar contours are within normal limits and stable. Stable tortuosity and calcification of the thoracic aorta. Stable surgical changes from bypass surgery. The lungs are clear of an acute process. No infiltrates, edema or effusions. Again noted is a cystic structure in right lower lobe. No worrisome pulmonary lesions. The bony thorax is intact. IMPRESSION: No acute cardiopulmonary findings. Electronically Signed   By: Marijo Sanes M.D.   On: 10/22/2021 19:56    ? ? ?

## 2021-10-23 NOTE — Progress Notes (Signed)
Initial Nutrition Assessment ? ?DOCUMENTATION CODES:  ?Not applicable ? ?INTERVENTION:  ?Liberalize diet to regular for advanced age ?Ensure Enlive po BID, each supplement provides 350 kcal and 20 grams of protein. ?MVI with minerals daily ? ?NUTRITION DIAGNOSIS:  ?Inadequate oral intake related to decreased appetite as evidenced by per patient/family report. ? ?GOAL:  ?Patient will meet greater than or equal to 90% of their needs ? ?MONITOR:  ?PO intake, Supplement acceptance, Weight trends ? ?REASON FOR ASSESSMENT:  ?Consult ?Assessment of nutrition requirement/status ? ?ASSESSMENT:  ?88 y.o. female with PMH of atrial fibrillation, CAD (s/p CABG x2), CKD, CHF, diverticulitis, GERD, HLD, HTN presented to ED with cold like symptoms and weakness. Admitted for acute respiratory failure due to bronchitis. ? ?Pt unavailable x2 for assessment. Working with therapies at the time of visits, unable to meet with pt at this time. Will defer nutrition interview and physical exam until follow-up. ? ?Weight loss of 7.7% noted in the last 6 months (9/28-3/30) which is not severe for this time frame, but concerning due to advanced age. ~8 lb wt loss noted in the last 3 days, question accuracy of these weights. Will continue to monitor.  ? ?Average Meal Intake: ?3/30: 83% average intake x 2 recorded meals ? ?Nutritionally Relevant Medications: ?Scheduled Meds: ? furosemide  40 mg Intravenous Once  ? levothyroxine  75 mcg Oral Daily  ? pantoprazole  40 mg Oral Daily  ? pravastatin  40 mg Oral Daily  ? ?Continuous Infusions: ? sodium chloride 75 mL/hr at 10/23/21 0054  ? ?PRN Meds: polyethylene glycol ? ?Labs Reviewed: ?Creatinine 1.44 ?Phosphorus 4.7 ? ?NUTRITION - FOCUSED PHYSICAL EXAM: ?Defer to follow up assessment ? ?Diet Order:   ?Diet Order   ? ?       ?  Diet regular Room service appropriate? Yes; Fluid consistency: Thin  Diet effective now       ?  ? ?  ?  ? ?  ? ? ?EDUCATION NEEDS:  ?No education needs have been identified  at this time ? ?Skin:  Skin Assessment: Reviewed RN Assessment ? ?Last BM:  3/29 ? ?Height:  ?Ht Readings from Last 1 Encounters:  ?10/23/21 '5\' 1"'$  (1.549 m)  ? ?Weight:  ?Wt Readings from Last 1 Encounters:  ?10/23/21 52.9 kg  ? ? ?Ideal Body Weight:  47.7 kg ? ?BMI:  Body mass index is 22.04 kg/m?. ? ?Estimated Nutritional Needs:  ?Kcal:  1300-1500 kcal/d ?Protein:  65-80g/d ?Fluid:  1.5-1.8L/d ? ? ?Ranell Patrick, RD, LDN ?Clinical Dietitian ?RD pager # available in [place]  ?After hours/weekend pager # available in [place] ?

## 2021-10-23 NOTE — Progress Notes (Signed)
Mobility Specialist Progress Note  ? ? 10/23/21 1626  ?Mobility  ?Activity Ambulated with assistance in hallway  ?Level of Assistance Contact guard assist, steadying assist  ?Assistive Device None  ?Distance Ambulated (ft) 270 ft  ?Activity Response Tolerated well  ?$Mobility charge 1 Mobility  ? ?Pt received in bed and agreeable. C/o some SOB with activity and like her heart was racing a little bit. Monitor did alert to tachy/extreme tachy. Returned to bed with call bell in reach. RN notified.  ? ?Hildred Alamin ?Mobility Specialist  ?  ?

## 2021-10-23 NOTE — Progress Notes (Signed)
SATURATION QUALIFICATIONS: (This note is used to comply with regulatory documentation for home oxygen)  Patient Saturations on Room Air at Rest = 96  Patient Saturations on Room Air while Ambulating = 94   

## 2021-10-23 NOTE — Plan of Care (Signed)
  Problem: Safety: Goal: Ability to remain free from injury will improve Outcome: Progressing   Problem: Skin Integrity: Goal: Risk for impaired skin integrity will decrease Outcome: Progressing   

## 2021-10-24 ENCOUNTER — Telehealth: Payer: Self-pay

## 2021-10-24 ENCOUNTER — Inpatient Hospital Stay (HOSPITAL_COMMUNITY): Payer: HMO

## 2021-10-24 ENCOUNTER — Other Ambulatory Visit (HOSPITAL_COMMUNITY): Payer: Self-pay

## 2021-10-24 DIAGNOSIS — J9601 Acute respiratory failure with hypoxia: Secondary | ICD-10-CM | POA: Diagnosis not present

## 2021-10-24 DIAGNOSIS — J40 Bronchitis, not specified as acute or chronic: Secondary | ICD-10-CM | POA: Diagnosis not present

## 2021-10-24 DIAGNOSIS — I480 Paroxysmal atrial fibrillation: Secondary | ICD-10-CM | POA: Diagnosis not present

## 2021-10-24 DIAGNOSIS — I2510 Atherosclerotic heart disease of native coronary artery without angina pectoris: Secondary | ICD-10-CM | POA: Diagnosis not present

## 2021-10-24 LAB — COMPREHENSIVE METABOLIC PANEL
ALT: 50 U/L — ABNORMAL HIGH (ref 0–44)
AST: 54 U/L — ABNORMAL HIGH (ref 15–41)
Albumin: 2.9 g/dL — ABNORMAL LOW (ref 3.5–5.0)
Alkaline Phosphatase: 49 U/L (ref 38–126)
Anion gap: 7 (ref 5–15)
BUN: 20 mg/dL (ref 8–23)
CO2: 25 mmol/L (ref 22–32)
Calcium: 8.6 mg/dL — ABNORMAL LOW (ref 8.9–10.3)
Chloride: 106 mmol/L (ref 98–111)
Creatinine, Ser: 1.7 mg/dL — ABNORMAL HIGH (ref 0.44–1.00)
GFR, Estimated: 29 mL/min — ABNORMAL LOW (ref >60)
Glucose, Bld: 93 mg/dL (ref 70–99)
Potassium: 4 mmol/L (ref 3.5–5.1)
Sodium: 138 mmol/L (ref 135–145)
Total Bilirubin: 0.6 mg/dL (ref 0.3–1.2)
Total Protein: 5.5 g/dL — ABNORMAL LOW (ref 6.5–8.1)

## 2021-10-24 LAB — CBC WITH DIFFERENTIAL/PLATELET
Abs Immature Granulocytes: 0.02 10*3/uL (ref 0.00–0.07)
Basophils Absolute: 0 10*3/uL (ref 0.0–0.1)
Basophils Relative: 0 %
Eosinophils Absolute: 0.1 10*3/uL (ref 0.0–0.5)
Eosinophils Relative: 2 %
HCT: 31.8 % — ABNORMAL LOW (ref 36.0–46.0)
Hemoglobin: 10.8 g/dL — ABNORMAL LOW (ref 12.0–15.0)
Immature Granulocytes: 0 %
Lymphocytes Relative: 13 %
Lymphs Abs: 0.8 10*3/uL (ref 0.7–4.0)
MCH: 32.5 pg (ref 26.0–34.0)
MCHC: 34 g/dL (ref 30.0–36.0)
MCV: 95.8 fL (ref 80.0–100.0)
Monocytes Absolute: 0.7 10*3/uL (ref 0.1–1.0)
Monocytes Relative: 13 %
Neutro Abs: 4.1 10*3/uL (ref 1.7–7.7)
Neutrophils Relative %: 72 %
Platelets: 126 10*3/uL — ABNORMAL LOW (ref 150–400)
RBC: 3.32 MIL/uL — ABNORMAL LOW (ref 3.87–5.11)
RDW: 14.5 % (ref 11.5–15.5)
WBC: 5.8 10*3/uL (ref 4.0–10.5)
nRBC: 0 % (ref 0.0–0.2)

## 2021-10-24 LAB — MAGNESIUM: Magnesium: 1.9 mg/dL (ref 1.7–2.4)

## 2021-10-24 LAB — C-REACTIVE PROTEIN: CRP: 15.8 mg/dL — ABNORMAL HIGH (ref <1.0)

## 2021-10-24 LAB — PROCALCITONIN: Procalcitonin: 0.32 ng/mL

## 2021-10-24 LAB — BRAIN NATRIURETIC PEPTIDE: B Natriuretic Peptide: 138.7 pg/mL — ABNORMAL HIGH (ref 0.0–100.0)

## 2021-10-24 MED ORDER — ALBUTEROL SULFATE HFA 108 (90 BASE) MCG/ACT IN AERS
2.0000 | INHALATION_SPRAY | Freq: Four times a day (QID) | RESPIRATORY_TRACT | 0 refills | Status: DC | PRN
  Filled 2021-10-24: qty 8.5, 25d supply, fill #0

## 2021-10-24 MED ORDER — AZITHROMYCIN 250 MG PO TABS
ORAL_TABLET | ORAL | 0 refills | Status: DC
  Filled 2021-10-24: qty 2, 2d supply, fill #0

## 2021-10-24 MED ORDER — GUAIFENESIN ER 600 MG PO TB12
600.0000 mg | ORAL_TABLET | Freq: Two times a day (BID) | ORAL | 0 refills | Status: DC | PRN
  Filled 2021-10-24 (×2): qty 15, 8d supply, fill #0

## 2021-10-24 NOTE — Telephone Encounter (Signed)
-----   Message from Ledora Bottcher, [place] sent at 10/24/2021  8:16 AM EDT ----- ?Pt needs a TOC phone call. We are not primary, I do not know when she will discharge. ? ?Thanks ?Angie ? ? ?

## 2021-10-24 NOTE — Discharge Instructions (Signed)
Follow with Primary MD [name], MD in 3-4 days  ? ?Get CBC, CMP, Magnesium,  2 view Chest X ray -  checked next visit within 3-4 days by Primary MD   ? ?Activity: As tolerated with Full fall precautions use walker/cane & assistance as needed ? ?Disposition Home   ? ?Diet: Heart Healthy   ? ?Special Instructions: If you have smoked or chewed Tobacco  in the last 2 yrs please stop smoking, stop any regular Alcohol  and or any Recreational drug use. ? ?On your next visit with your primary care physician please Get Medicines reviewed and adjusted. ? ?Please request your Prim.MD to go over all Hospital Tests and Procedure/Radiological results at the follow up, please get all Hospital records sent to your Prim MD by signing hospital release before you go home. ? ?If you experience worsening of your admission symptoms, develop shortness of breath, life threatening emergency, suicidal or homicidal thoughts you must seek medical attention immediately by calling 911 or calling your MD immediately  if symptoms less severe. ? ?You Must read complete instructions/literature along with all the possible adverse reactions/side effects for all the Medicines you take and that have been prescribed to you. Take any new Medicines after you have completely understood and accpet all the possible adverse reactions/side effects.  ? ?  ?

## 2021-10-24 NOTE — Progress Notes (Signed)
?Cardiology Office Note:   ? ?Date:  11/06/2021  ? ?ID:  Jacqueline [name], DOB 10-31-1930, MRN [id number] ? ?PCP:  Jacqueline Resides, MD ?  ?[facility] Providers ?Cardiologist:  Sanda Klein, MD ?Cardiology APP:  Ledora Bottcher, [place] { ? ?Referring MD: Jacqueline Resides, MD  ? ?Chief Complaint  ?Patient presents with  ? Hospitalization Follow-up  ?  HTN  ? ? ?History of Present Illness:   ? ?Jacqueline [name] is a 88 y.o. female with a hx of hypertension, orthostatic hypotension with falls, PAF/flutter, chronic anticoagulation, CAD s/p CABG in 2006, HFpEF, and hyperlipidemia.  She has historically not tolerated a beta-blocker or higher doses of amiodarone.  She has been quite stable on 200 mg amnio daily.  She is anticoagulated with 2.5 mg Eliquis twice daily for age and renal function.  She was maintained on 25 mg hydralazine 3 times daily for hypertension with a goal SBP of 160.  Unfortunately she has orthostatic symptoms with a blood pressure below 120.  Given her orthostatic hypotension, antianginal and diuretic therapy has been limited.  She has as needed midodrine to use at home.  She is very good at tracking her blood pressure.  She recently called our office 09/2021 with elevated BP on 25 mg 3 times daily hydralazine was increased to 37.5 mg 3 times daily. ? ?She presented to [facility] with symptoms of URI and tested positive for rhinovirus. Cardiology was consulted for RVR. Telemetry with bouts of Afib and sinus rhythm.  She is asymptomatic with her RVR.  No medication changes were made, she was continued on 200 mg amiodarone.  Her pressure remained in the 140s to 150s without hydralazine.  She was discharged without hydralazine and instructions to track her blood pressure until follow-up. ? ?She presents today for [facility] hospital follow-up.  She presents with her son who makes her pillbox every week.  It appears that she was discharged with hydralazine 37.5 3 times daily scheduled, and that is how she has  been taking it.  Her blood pressure log looks similar to her log prior to her recent admission.  However, she reports being dizzy daily.  She has had a fall since her discharge, but this sounds mechanical and not related to orthostatic hypotension.  She presents today with a blood pressure of 110/70, which is quite low for her.  She did take a midodrine this morning for a blood pressure of 105. ? ?Because that she has been symptomatic since discharge on 37.5 mg hydralazine 3 times daily, we discussed changes to this regimen as follows ? ?She will take 25 mg of hydralazine for SBP >160 ?If she takes hydralazine in the morning, she will check her blood pressure 1 time approximately 8 hours later to determine second dose of hydralazine.  She will take midodrine for SBP less than 130, and will not take hydralazine the rest of the day.  Due to her labile blood pressures and as needed medications, I would like to follow this closely and will see them back in 1.5 weeks. ? ? ? ?Past Medical History:  ?Diagnosis Date  ? Arthritis   ? back  ? Atrial fibrillation with rapid ventricular response ([place]) 02/2014  ? a. CHA2DS2VASc = 6 -> on eliquis;  b. 02/2014 s/p DCCV;  c. 08/2014 Echo: EF 55-60%, Gr 2 DD, mild MR, triv AI.  ? CAD (coronary artery disease)   ? a. s/p CABG x 2 (LIMA->LAD, VG->Diag);  b. 08/2014 Cath:  LM nl, LAD 90p, LCX nl, RCA nl/dominant, LIMA->LAD atretic, VG->D2 patent w/ retrograde filling of LAD, EF 55-60%-->Med Rx.  ? Diverticulitis 02/21/2012  ? GERD (gastroesophageal reflux disease)   ? Hyperlipemia   ? Hypertension   ? Insomnia   ? ? ?Past Surgical History:  ?Procedure Laterality Date  ? ABDOMINAL HYSTERECTOMY  1975  ? CARDIAC CATHETERIZATION N/A 05/09/2015  ? Procedure: Right Heart Cath;  Surgeon: Larey Dresser, MD;  Location: [facility];  Service: Cardiovascular;  Laterality: N/A;  ? CARDIOVERSION N/A 02/28/2014  ? Procedure: CARDIOVERSION;  Surgeon: Sanda Klein, MD;  Location: [facility];   Service: Cardiovascular;  Laterality: N/A;  ? CARDIOVERSION N/A 01/30/2019  ? Procedure: CARDIOVERSION;  Surgeon: Fay Records, MD;  Location: [facility];  Service: Cardiovascular;  Laterality: N/A;  ? CATARACT EXTRACTION Bilateral 2014  ? with lens implanted  ? COLONOSCOPY N/A 02/19/2015  ? Procedure: COLONOSCOPY;  Surgeon: Ladene Artist, MD;  Location[facility] END[facility];  Service: Endoscopy;  Laterality: N/A;  ? CORONARY ARTERY BYPASS GRAFT  2006  ? off-pump bypass surgery with LIMA to the LAD and SVG to the second diagonal artery ( performed by Dr Servando Snare)   ? JOINT REPLACEMENT Bilateral   ? L-2004, R-2006  ? LEFT HEART CATHETERIZATION WITH CORONARY/GRAFT ANGIOGRAM N/A 09/17/2014  ? Procedure: LEFT HEART CATHETERIZATION WITH Beatrix Fetters;  Surgeon: Troy Sine, MD;  Location: [facility] C[facility] LAB;  Service: Cardiovascular;  Laterality: N/A;  ? REIMPLANTATION OF TOTAL KNEE Right 10/08/2014  ? Procedure: REIMPLANTATION OF RIGHT TOTAL KNEE ARTHROPLASTY WITH REMOVAL OF ANTIBIOTIC SPACER;  Surgeon: Paralee Cancel, MD;  Location: [facility];  Service: Orthopedics;  Laterality: Right;  ? ROTATOR CUFF REPAIR Bilateral   ? r-1999, l- 2005  ? TEE WITHOUT CARDIOVERSION N/A 02/28/2014  ? Procedure: TRANSESOPHAGEAL ECHOCARDIOGRAM (TEE);  Surgeon: Sanda Klein, MD;  Location: [facility];  Service: Cardiovascular;  Laterality: N/A;  ? TEE WITHOUT CARDIOVERSION N/A 01/30/2019  ? Procedure: TRANSESOPHAGEAL ECHOCARDIOGRAM (TEE);  Surgeon: Fay Records, MD;  Location: [place];  Service: Cardiovascular;  Laterality: N/A;  ? TOTAL KNEE ARTHROPLASTY Right 07/02/2014  ? Procedure: Resection of Infected Right Total Knee Arthroplasty with placement antibiotic spacer;  Surgeon: Mauri Pole, MD;  Location: [facility];  Service: Orthopedics;  Laterality: Right;  ? ? ?Current Medications: ?Current Meds  ?Medication Sig  ? acetaminophen (TYLENOL) 500 MG tablet Take 1,000 mg by mouth every 6 (six) hours as needed for moderate pain or headache  (pain/headache.).  ? albuterol (VENTOLIN HFA) 108 (90 Base) MCG/ACT inhaler Inhale 2 puffs into the lungs every 6 (six) hours as needed for wheezing or shortness of breath.  ? ALPRAZolam (XANAX) 0.5 MG tablet TAKE 1 TABLET BY MOUTH TWICE A DAY AS NEEDED FOR ANXIETY (Patient taking differently: Take 0.5 mg by mouth at bedtime.)  ? amiodarone (PACERONE) 200 MG tablet TAKE 1 TABLET BY MOUTH EVERY DAY (Patient taking differently: Take 200 mg by mouth daily.)  ? amoxicillin (AMOXIL) 500 MG capsule Take 1 capsule (500 mg total) by mouth 3 (three) times daily.  ? apixaban (ELIQUIS) 2.5 MG TABS tablet Take 1 tablet (2.5 mg total) by mouth 2 (two) times daily.  ? azithromycin (ZITHROMAX Z-PAK) 250 MG tablet Take 1 tablet by mouth daily until gone.  ? cholecalciferol (VITAMIN D3) 25 MCG (1000 UNIT) tablet Take 1,000 Units by mouth daily.  ? cycloSPORINE (RESTASIS) 0.05 % ophthalmic emulsion Place 1 drop into both eyes 2 (two) times daily.  ?  gabapentin (NEURONTIN) 100 MG capsule TAKE 1 CAPSULE (100 MG TOTAL) BY MOUTH 3 (THREE) TIMES DAILY.  ? guaiFENesin (MUCINEX) 600 MG 12 hr tablet Take 1 tablet (600 mg total) by mouth 2 (two) times daily as needed for cough or to loosen phlegm.  ? hydrALAZINE (APRESOLINE) 25 MG tablet Take 1 tablet (25 mg total) by mouth as needed (for SBP greater than 160).  ? levothyroxine (SYNTHROID) 75 MCG tablet TAKE 1 TABLET BY MOUTH EVERY DAY (Patient taking differently: Take 75 mcg by mouth daily before breakfast.)  ? meclizine (ANTIVERT) 25 MG tablet Take 25 mg by mouth 3 (three) times daily as needed for dizziness.  ? midodrine (PROAMATINE) 2.5 MG tablet TAKE 1 TABLET BY MOUTH IN MORNING AND 1 TAB 6 HOURS LATER, DO NOT LAY FLAT FOR 4 HOURS AFTER TAKING THE MEDICATION. DO NOT TAKE FOR A SYSTOLIC OF 237 OF ABOVE. (Patient taking differently: Take 2.5 mg by mouth daily as needed (if blood pressure is 130 or below).)  ? Multiple Vitamin (MULTIVITAMIN WITH MINERALS) TABS tablet Take 1 tablet by mouth  daily.  ? nitroGLYCERIN (NITROSTAT) 0.4 MG SL tablet PLACE 1 TABLET UNDER THE TONGUE EVERY 5 MINUTES AS NEEDED FOR CHEST PAIN UP TO 3 DOSES (Patient taking differently: Place 0.4 mg under the tongue every

## 2021-10-24 NOTE — Telephone Encounter (Signed)
3/31 Patient still admitted. ?

## 2021-10-24 NOTE — TOC Initial Note (Addendum)
Transition of Care (TOC) - Initial/Assessment Note  ? ? ?Patient Details  ?Name: Jacqueline [name] [facility] ?MRN: [id number] ?Date of Birth: [birth year] ? ?Transition of Care (TOC) CM/SW Contact:    ?Graves-Bigelow, Ocie Cornfield, RN ?Phone Number: ?10/24/2021, 11:37 AM ? ?Clinical Narrative: Case Manager spoke with the patient regarding home health needs and the patient has declined [place] services. Patient states she has support of family. Case Manager discussed home oxygen with the patient-orders are in the system for home 02 and patient states she was told that she did not need oxygen for home. Case Manager did reach out to MD for clarification-no response. Patient sats reflected 92% during ambulatory sat. Patient will not transition home with oxygen today. Patient has durable medical rolling walker in the home. No further needs from Case Manager at this time.              ? ? ?Expected Discharge Plan: Home/Self Care ?Barriers to Discharge: No Barriers Identified ? ? ?Patient Goals and CMS Choice ?Patient states their goals for this hospitalization and ongoing recovery are:: to return home ?  ?Choice offered to / list presented to : NA ? ?Expected Discharge Plan and Services ?Expected Discharge Plan: Home/Self Care ?  ?Discharge Planning Services: CM Consult ?Post Acute Care Choice: NA ?Living arrangements for the past 2 months: [place] ?Expected Discharge Date: 10/24/21               ?  ?DME Agency: NA ?  ?  ?  ?HH Arranged: Patient Refused HH ?  ?  ?  ?  ? ?Prior Living Arrangements/Services ?Living arrangements for the past 2 months: [place] ?Lives with:: Self (Has support of children.) ?Patient language and need for interpreter reviewed:: Yes ?Do you feel safe going back to the place where you live?: Yes      ?Need for Family Participation in Patient Care: Yes (Comment) ?Care giver support system in place?: Yes (comment) ?  ?Criminal Activity/Legal Involvement Pertinent to Current  Situation/Hospitalization: No - Comment as needed ? ?Activities of Daily Living ?Home Assistive Devices/Equipment: Gilford Rile (specify type), Cane (specify quad or straight) ?ADL Screening (condition at time of admission) ?Patient's cognitive ability adequate to safely complete daily activities?: Yes ?Is the patient deaf or have difficulty hearing?: No ?Does the patient have difficulty seeing, even when wearing glasses/contacts?: No ?Does the patient have difficulty concentrating, remembering, or making decisions?: No ?Patient able to express need for assistance with ADLs?: Yes ?Does the patient have difficulty dressing or bathing?: No ?Independently performs ADLs?: Yes (appropriate for developmental age) ?Does the patient have difficulty walking or climbing stairs?: Yes ?Weakness of Legs: Both ?Weakness of Arms/Hands: Both ? ?Permission Sought/Granted ?Permission sought to share information with : Family Supports, Case Manager ?  ? ?Emotional Assessment ?Appearance:: Appears stated age ?Attitude/Demeanor/Rapport: Engaged ?Affect (typically observed): Appropriate ?Orientation: : Oriented to Situation, Oriented to  Time, Oriented to Place, Oriented to Self ?Alcohol / Substance Use: Not Applicable ?Psych Involvement: No (comment) ? ?Admission diagnosis:  CHF exacerbation ([place]) [I50.9] ?Acute respiratory failure with hypoxia ([place]) [J96.01] ?Patient Active Problem List  ? Diagnosis Date Noted  ? CHF exacerbation ([place]) 10/22/2021  ? Bronchitis 10/22/2021  ? Acute respiratory failure with hypoxia ([place]) 10/22/2021  ? Elevated troponin 10/22/2021  ? Abnormal ECG 10/22/2021  ? Polypharmacy 02/21/2021  ? Hypertensive kidney disease with CKD stage III ([place]) 10/17/2020  ? Tremor 12/13/2019  ? Weakness generalized 06/08/2019  ? Frequent falls 06/08/2019  ?  Pes anserinus bursitis of right knee 03/23/2019  ? Vertigo 11/24/2018  ? On amiodarone therapy 07/08/2018  ? Adjustment reaction with physical symptoms 09/16/2017  ?  Atherosclerosis of abdominal aorta ([place]) 07/22/2017  ? Hypothyroidism 07/22/2017  ? CAD (coronary artery disease)   ? Long term (current) use of anticoagulants 07/08/2017  ? Hereditary and idiopathic peripheral neuropathy 12/12/2015  ? Chronic diastolic CHF (congestive heart failure) ([place]) 06/05/2015  ? Pulmonary arterial hypertension ([place]) 05/17/2015  ? Tachy-brady syndrome ([place]) 05/06/2015  ? Hx of CABG 05/06/2015  ? Preventative health care 03/27/2015  ? Acute on chronic diastolic heart failure ([place])   ? S/P knee replacement 10/08/2014  ? Osteoporosis 03/22/2014  ? Paroxysmal atrial fibrillation ([place]) 02/27/2014  ? Hypertension   ? GERD (gastroesophageal reflux disease)   ? Dyslipidemia 09/23/2006  ? RHINITIS, ALLERGIC 09/23/2006  ? REFLUX ESOPHAGITIS 09/23/2006  ? DIVERTICULOSIS OF COLON 09/23/2006  ? BACK PAIN, LOW 09/23/2006  ? INSOMNIA NOS 09/23/2006  ? ?PCP:  [name], MD ?Pharmacy:   ?[id number] [id number]?Phone: [phone]: [phone]? ?[facility] [place]?Phone: [phone]: [phone]? ?[facility]?[place]?Phone: [phone]: [phone]? ?  ? ?Readmission Risk Interventions ?   ? View : No data to display.  ?  ?  ?  ? ? ? ?

## 2021-10-24 NOTE — Progress Notes (Signed)
?  Patient Saturations on Room Air at Rest = 92% ?Patient Saturations on Room Air while Ambulating = 92% ? ?Patient ambulated down hallway on room air without assistance.  HR increased from 75 to 150bpm with movement.  HR decreased back to 80s after returned to rest.  ?

## 2021-10-24 NOTE — Progress Notes (Addendum)
? ?Progress Note ? ?Patient Name: Jacqueline [name] ?Date of Encounter: 10/24/2021 ? ?[facility] Cardiologist: Sanda Klein, MD  ? ?Subjective  ? ?Pt off of O2, feeling better. She ambulated in the hall without symptoms. Still with cough and course lung sounds. ? ?Inpatient Medications  ?  ?Scheduled Meds: ? amiodarone  200 mg Oral Daily  ? apixaban  2.5 mg Oral BID  ? cycloSPORINE  1 drop Both Eyes BID  ? feeding supplement  237 mL Oral BID BM  ? gabapentin  100 mg Oral TID  ? guaiFENesin  600 mg Oral BID  ? levothyroxine  75 mcg Oral Daily  ? multivitamin with minerals  1 tablet Oral Daily  ? pantoprazole  40 mg Oral Daily  ? pravastatin  40 mg Oral Daily  ? rOPINIRole  0.25 mg Oral TID  ? ?Continuous Infusions: ? azithromycin Stopped (10/23/21 2315)  ? ?PRN Meds: ?acetaminophen **OR** acetaminophen, albuterol, ALPRAZolam, chlorpheniramine-HYDROcodone, midodrine, nitroGLYCERIN, polyethylene glycol, traMADol, traZODone  ? ?Vital Signs  ?  ?Vitals:  ? 10/23/21 2000 10/24/21 1610 10/24/21 9604 10/24/21 0755  ?BP: 138/78 (!) 143/62 (!) 155/66 (!) 164/65  ?Pulse: 71 69 67 73  ?Resp: 20 (!) '22 19 20  '$ ?Temp: 98 ?F (36.7 ?C) 98.4 ?F (36.9 ?C) 98 ?F (36.7 ?C) 97.7 ?F (36.5 ?C)  ?TempSrc: Oral Oral Oral Oral  ?SpO2: 93% 90% 91% 93%  ?Weight:      ?Height:      ? ? ?Intake/Output Summary (Last 24 hours) at 10/24/2021 0804 ?Last data filed at 10/23/2021 2315 ?Gross per 24 hour  ?Intake 1465.67 ml  ?Output 950 ml  ?Net 515.67 ml  ? ? ?  10/23/2021  ?  2:00 AM 10/20/2021  ? 10:29 AM 06/17/2021  ? 11:47 AM  ?Last 3 Weights  ?Weight (lbs) 116 lb 10 oz 124 lb 9.6 oz 128 lb  ?Weight (kg) 52.9 kg 56.518 kg 58.06 kg  ?   ? ?Telemetry  ?  ?Sinus in the 60-70s, Afib RVR vs sinus or atrial tachycardia with walking in the 130s - Personally Reviewed ? ?ECG  ?  ?No new tracings - Personally Reviewed ? ?Physical Exam  ? ?GEN: No acute distress.   ?Neck: No JVD ?Cardiac: RRR, no murmurs, rubs, or gallops.  ?Respiratory: cough, course sounds  throughout ?GI: Soft, nontender, non-distended  ?MS: No edema; No deformity. ?Neuro:  Nonfocal  ?Psych: Normal affect  ? ?Labs  ?  ?High Sensitivity Troponin:   ?Recent Labs  ?Lab 10/22/21 ?1928 10/22/21 ?2105 10/23/21 ?5409 10/23/21 ?0328  ?TROPONINIHS 27* 34* 47* 50*  ?   ?Chemistry ?Recent Labs  ?Lab 10/22/21 ?1928 10/22/21 ?2310 10/23/21 ?0328 10/24/21 ?8119  ?NA 139  --  140 138  ?K 3.1*  --  4.1 4.0  ?CL 106  --  105 106  ?CO2 23  --  26 25  ?GLUCOSE 128*  --  103* 93  ?BUN 16  --  16 20  ?CREATININE 1.42*  --  1.44* 1.70*  ?CALCIUM 9.1  --  8.6* 8.6*  ?MG  --  2.0 1.9 1.9  ?PROT  --  6.7 5.8* 5.5*  ?ALBUMIN  --  3.6 3.1* 2.9*  ?AST  --  30 31 54*  ?ALT  --  32 32 50*  ?ALKPHOS  --  14 78 29  ?BILITOT  --  0.6 0.6 0.6  ?GFRNONAA 36*  --  35* 29*  ?ANIONGAP 10  --  9 7  ?  ?  Lipids  ?Recent Labs  ?Lab 10/20/21 ?1107  ?CHOL 147  ?TRIG 139  ?HDL 62  ?LABVLDL 24  ?[place] 61  ?CHOLHDL 2.4  ?  ?Hematology ?Recent Labs  ?Lab 10/22/21 ?1928 10/23/21 ?0328 10/24/21 ?0539  ?WBC 12.1* 10.7* 5.8  ?RBC 3.74* 3.45* 3.32*  ?HGB 12.2 11.1* 10.8*  ?HCT 36.2 33.0* 31.8*  ?MCV 96.8 95.7 95.8  ?MCH 32.6 32.2 32.5  ?MCHC 33.7 33.6 34.0  ?RDW 14.6 14.5 14.5  ?PLT 155 144* 126*  ? ?Thyroid  ?Recent Labs  ?Lab 10/23/21 ?0328  ?TSH 1.353  ?  ?BNP ?Recent Labs  ?Lab 10/22/21 ?1928 10/23/21 ?0328 10/24/21 ?7673  ?BNP 435.4* 477.0* 138.7*  ?  ?DDimer  ?Recent Labs  ?Lab 10/22/21 ?1928  ?DDIMER 0.49  ?  ? ?Radiology  ?  ?DG Chest 2 View ? ?Result Date: 10/22/2021 ?CLINICAL DATA:  Shortness of breath. EXAM: CHEST - 2 VIEW COMPARISON:  05/26/2020 FINDINGS: The cardiac silhouette, mediastinal and hilar contours are within normal limits and stable. Stable tortuosity and calcification of the thoracic aorta. Stable surgical changes from bypass surgery. The lungs are clear of an acute process. No infiltrates, edema or effusions. Again noted is a cystic structure in right lower lobe. No worrisome pulmonary lesions. The bony thorax is intact.  IMPRESSION: No acute cardiopulmonary findings. Electronically Signed   By: Marijo Sanes M.D.   On: 10/22/2021 19:56  ? ?DG Chest Port 1 View ? ?Result Date: 10/24/2021 ?CLINICAL DATA:  Shortness of breath. EXAM: PORTABLE CHEST 1 VIEW COMPARISON:  Chest XRs, most recent the 10/22/2021. CT chest, 06/08/2015 FINDINGS: Cardiomediastinal silhouette is within normal limits. Postsurgical changes of coronary bypass. Lungs are well inflated. No focal consolidation or mass. Well-circumscribed RIGHT mid lung radiolucency, consistent with known pulmonary cyst. No pleural effusion or pneumothorax. Splenic artery calcifications. Median sternotomy change. No acute osseous abnormality. IMPRESSION: No acute cardiopulmonary process. Electronically Signed   By: Michaelle Birks M.D.   On: 10/24/2021 07:04  ? ?ECHOCARDIOGRAM COMPLETE ? ?Result Date: 10/23/2021 ?   ECHOCARDIOGRAM REPORT   Patient Name:   Jacqueline [name] [facility] Date of Exam: 10/23/2021 Medical Rec #:  [id number]        Height:       61.0 in Accession #:    [id number]       Weight:       116.6 lb Date of Birth:  [birth year]        BSA:          1.502 m? Patient Age:    88 years         BP:           129/68 mmHg Patient Gender: F                HR:           84 bpm. Exam Location:  Inpatient Procedure: 2D Echo, Cardiac Doppler and Color Doppler Indications:    I50.40* Unspecified combined systolic (congestive) and diastolic                 (congestive) heart failure  History:        Patient has prior history of Echocardiogram examinations, most                 recent 04/23/2020. CHF, CAD, Abnormal ECG and Prior CABG,                 Signs/Symptoms:Chest Pain; Risk Factors:Hypertension and  Dyslipidemia.  Sonographer:    Roseanna Rainbow RDCS Referring Phys: [place]  Sonographer Comments: Technically difficult study due to poor echo windows. IMPRESSIONS  1. Left ventricular ejection fraction, by estimation, is 60 to 65%. The left ventricle has normal function. The  left ventricle has no regional wall motion abnormalities. There is mild left ventricular hypertrophy. Left ventricular diastolic parameters are indeterminate.  2. Right ventricular systolic function is normal. The right ventricular size is normal.  3. The mitral valve is normal in structure. Trivial mitral valve regurgitation. No evidence of mitral stenosis.  4. The aortic valve is tricuspid. Aortic valve regurgitation is trivial. Aortic valve sclerosis is present, with no evidence of aortic valve stenosis.  5. The inferior vena cava is normal in size with greater than 50% respiratory variability, suggesting right atrial pressure of 3 mmHg. FINDINGS  Left Ventricle: Left ventricular ejection fraction, by estimation, is 60 to 65%. The left ventricle has normal function. The left ventricle has no regional wall motion abnormalities. The left ventricular internal cavity size was normal in size. There is  mild left ventricular hypertrophy. Left ventricular diastolic parameters are indeterminate. Right Ventricle: The right ventricular size is normal. Right ventricular systolic function is normal. Left Atrium: Left atrial size was normal in size. Right Atrium: Right atrial size was normal in size. Pericardium: There is no evidence of pericardial effusion. Mitral Valve: The mitral valve is normal in structure. Mild mitral annular calcification. Trivial mitral valve regurgitation. No evidence of mitral valve stenosis. Tricuspid Valve: The tricuspid valve is normal in structure. Tricuspid valve regurgitation is trivial. No evidence of tricuspid stenosis. Aortic Valve: The aortic valve is tricuspid. Aortic valve regurgitation is trivial. Aortic valve sclerosis is present, with no evidence of aortic valve stenosis. Pulmonic Valve: The pulmonic valve was normal in structure. Pulmonic valve regurgitation is not visualized. No evidence of pulmonic stenosis. Aorta: The aortic root is normal in size and structure. Venous: The  inferior vena cava is normal in size with greater than 50% respiratory variability, suggesting right atrial pressure of 3 mmHg. IAS/Shunts: No atrial level shunt detected by color flow Doppler.  LEFT VENTRICLE P

## 2021-10-24 NOTE — Discharge Summary (Signed)
?                                                                                ? ?Jacqueline [name] JSH:[id number] DOB: September 16, 1930 DOA: 10/22/2021 ? ?PCP: [name], MD ? ?Admit date: 10/22/2021  Discharge date: 10/24/2021 ? ?Admitted From: Home   Disposition:  Home ? ? ?Recommendations for Outpatient Follow-up:  ? ?Follow up with PCP in 1-2 weeks ? ?PCP Please obtain BMP/CBC, 2 view CXR in 1week,  (see Discharge instructions)  ? ?PCP Please follow up on the following pending results: Monitor blood pressure, check CBC, CMP, magnesium and a two-view chest x-ray in 5 to 7 days. ? ? ?Home Health: PT, RN if qualifies   ?Equipment/Devices: as below  ?Consultations: None  ?Discharge Condition: Stable    ?CODE STATUS: Full    ?Diet Recommendation: Heart Healthy  ? ?Diet Order   ? ?       ?  Diet - low sodium heart healthy       ?  ?  Diet regular Room service appropriate? Yes; Fluid consistency: Thin  Diet effective now       ?  ? ?  ?  ? ?  ?  ? ?Chief Complaint  ?Patient presents with  ? Cough  ? Shortness of Breath  ?  ? ?Brief history of present illness from the day of admission and additional interim summary   ? ?88 y.o. female with medical history significant of a.fib on Eliquis, CKD 3B with baseline creatinine 1.6, CAD sp CABG, to the hospital with several day history of low-grade fevers, cough and shortness of breath, she was diagnosed with bronchitis and acute hypoxic respiratory failure and admitted to the hospital. ? ?                                                               Hospital Course  ? ?Acute  hypoxic respiratory failure in a patient with mild bronchitis due to rhinovirus and superimposed mild bacterial infection with evidence of fluid overload likely acute on chronic diastolic CHF EF 85% on echocardiogram.  She was treated with IV azithromycin along with nebulizer treatments and oxygen, now at baseline  no shortness of breath on room air at rest, will give 2 more days of oral azithromycin for superimposed bacterial component, albuterol inhaler at home.  She is eager to go home and does not want to go to SNF.  Will be discharged with outpatient PCP follow-up.   ? ?Mild acute on chronic diastolic CHF last EF 02%.:  Noted unchanged, single dose Lasix provided 10/23/2021, close to baseline and symptom-free.  Discharge home with outpatient PCP follow-up on home medications  ? ?Paroxysmal atrial fibrillation with [place] vas 2 score of 3, history of tachybradycardia syndrome.  Continue low-dose twice daily metoprolol along with oral amiodarone and Eliquis.  Monitor. ?  ?CAD.  No acute issues continue Eliquis and statin for secondary prevention.  Also on  beta-blocker. ?  ?CKD 3B.  Baseline creatinine close to 1.6.  Most to baseline follow-up with PCP postdischarge. ?  ?Hypertension.  Low-dose beta-blocker at home also takes midodrine as needed at home.. ?  ?Dyslipidemia.  On statin continue. ?  ?Hypothyroidism.  On Synthroid, stable TSH. ?  ?History of pulmonary hypertension.  Supportive care for now. ?  ?Mild bump in troponin in non-ACS pattern with flat trend.  Chest pain-free now likely due to demand mismatch from hypoxia and increased work of breathing.  Continue beta-blocker Eliquis and statin.  Outpatient cardiology follow-up postdischarge. ? ? ?Discharge diagnosis   ? ? ?Active Problems: ?  Bronchitis ?  Acute respiratory failure with hypoxia ([place]) ?  Dyslipidemia ?  Paroxysmal atrial fibrillation ([facility]) ?  Tachy-brady syndrome ([place]) ?  Pulmonary arterial hypertension ([place]) ?  Chronic diastolic CHF (congestive heart failure) ([place]) ?  CAD (coronary artery disease) ?  Hypertension ?  Hypothyroidism ?  Elevated troponin ?  Abnormal ECG ? ? ? ?Discharge instructions   ? ?Discharge Instructions   ? ? Diet - low sodium heart healthy   Complete by: As directed ?  ? Discharge instructions   Complete by: As directed ?  ? Follow  with Primary MD [name], MD in 3-4 days  ? ?Get CBC, CMP, Magnesium,  2 view Chest X ray -  checked next visit within 3-4 days by Primary MD   ? ?Activity: As tolerated with Full fall precautions use walker/cane & assistance as needed ? ?Disposition Home   ? ?Diet: Heart Healthy   ? ?Special Instructions: If you have smoked or chewed Tobacco  in the last 2 yrs please stop smoking, stop any regular Alcohol  and or any Recreational drug use. ? ?On your next visit with your primary care physician please Get Medicines reviewed and adjusted. ? ?Please request your Prim.MD to go over all Hospital Tests and Procedure/Radiological results at the follow up, please get all Hospital records sent to your Prim MD by signing hospital release before you go home. ? ?If you experience worsening of your admission symptoms, develop shortness of breath, life threatening emergency, suicidal or homicidal thoughts you must seek medical attention immediately by calling 911 or calling your MD immediately  if symptoms less severe. ? ?You Must read complete instructions/literature along with all the possible adverse reactions/side effects for all the Medicines you take and that have been prescribed to you. Take any new Medicines after you have completely understood and accpet all the possible adverse reactions/side effects.  ? Increase activity slowly   Complete by: As directed ?  ? ?  ? ? ?Discharge Medications  ? ?Allergies as of 10/24/2021   ? ?   Reactions  ? Atorvastatin Other (See Comments)  ? Myalgias in legs  ? Beta Adrenergic Blockers Other (See Comments)  ? Severe bradycardia  ? Crestor [rosuvastatin] Other (See Comments)  ? Myalgias in legs  ? Morphine And Related Other (See Comments)  ? "Crazy thoughts, severe headache, sick"  ? Prednisone   ? On two occasions has caused A fib  ? ?  ? ?  ?Medication List  ?  ? ?STOP taking these medications   ? ?predniSONE 5 MG (48) Tbpk tablet ?Commonly known as: STERAPRED UNI-PAK 48  TAB ?  ? ?  ? ?TAKE these medications   ? ?acetaminophen 500 MG tablet ?Commonly known as: TYLENOL ?Take 1,000 mg by mouth every 6 (six) hours as needed for moderate pain  or headache (pain/headache.). ?  ?albuterol 108 (90 Base) MCG/ACT inhaler ?Commonly known as: VENTOLIN HFA ?Inhale 2 puffs into the lungs every 6 (six) hours as needed for wheezing or shortness of breath. ?  ?ALPRAZolam 0.5 MG tablet ?Commonly known as: Duanne Moron ?TAKE 1 TABLET BY MOUTH TWICE A DAY AS NEEDED FOR ANXIETY ?What changed: See the new instructions. ?  ?amiodarone 200 MG tablet ?Commonly known as: PACERONE ?TAKE 1 TABLET BY MOUTH EVERY DAY ?  ?apixaban 2.5 MG Tabs tablet ?Commonly known as: ELIQUIS ?Take 1 tablet (2.5 mg total) by mouth 2 (two) times daily. ?What changed: Another medication with the same name was removed. Continue taking this medication, and follow the directions you see here. ?  ?azithromycin 250 MG tablet ?Commonly known as: Zithromax Z-Pak ?Take as directed ?  ?cholecalciferol 25 MCG (1000 UNIT) tablet ?Commonly known as: VITAMIN D3 ?Take 1,000 Units by mouth daily. ?  ?cycloSPORINE 0.05 % ophthalmic emulsion ?Commonly known as: RESTASIS ?Place 1 drop into both eyes 2 (two) times daily. ?  ?Fish Oil 1000 MG Caps ?Take 1,000 mg by mouth every evening. ?  ?gabapentin 100 MG capsule ?Commonly known as: NEURONTIN ?TAKE 1 CAPSULE (100 MG TOTAL) BY MOUTH 3 (THREE) TIMES DAILY. ?  ?guaiFENesin 600 MG 12 hr tablet ?Commonly known as: Naturita ?Take 1 tablet (600 mg total) by mouth 2 (two) times daily as needed for cough or to loosen phlegm. ?  ?hydrALAZINE 25 MG tablet ?Commonly known as: APRESOLINE ?Take 1.5 tablets (37.5 mg total) by mouth 3 (three) times daily. ?  ?levothyroxine 75 MCG tablet ?Commonly known as: SYNTHROID ?TAKE 1 TABLET BY MOUTH EVERY DAY ?What changed: when to take this ?  ?Lubricant Eye Drops 0.4-0.3 % Soln ?Generic drug: Polyethyl Glycol-Propyl Glycol ?Place 1 drop into both eyes 4 (four) times daily. ?   ?meclizine 25 MG tablet ?Commonly known as: ANTIVERT ?Take 25 mg by mouth 3 (three) times daily as needed for dizziness. ?  ?midodrine 2.5 MG tablet ?Commonly known as: PROAMATINE ?TAKE 1 TABLET BY MOUTH IN

## 2021-10-26 LAB — URINE CULTURE: Culture: >=100000 — AB

## 2021-10-27 ENCOUNTER — Encounter: Payer: Self-pay | Admitting: Family Medicine

## 2021-10-27 ENCOUNTER — Ambulatory Visit (INDEPENDENT_AMBULATORY_CARE_PROVIDER_SITE_OTHER): Payer: HMO | Admitting: Family Medicine

## 2021-10-27 DIAGNOSIS — J9601 Acute respiratory failure with hypoxia: Secondary | ICD-10-CM | POA: Diagnosis not present

## 2021-10-27 DIAGNOSIS — I10 Essential (primary) hypertension: Secondary | ICD-10-CM | POA: Diagnosis not present

## 2021-10-27 DIAGNOSIS — N390 Urinary tract infection, site not specified: Secondary | ICD-10-CM

## 2021-10-27 LAB — CULTURE, BLOOD (ROUTINE X 2)
Culture: NO GROWTH
Culture: NO GROWTH
Special Requests: ADEQUATE
Special Requests: ADEQUATE

## 2021-10-27 MED ORDER — AMOXICILLIN 500 MG PO CAPS: 500.0000 mg | ORAL_CAPSULE | Freq: Three times a day (TID) | ORAL | 0 refills | Status: DC

## 2021-10-27 NOTE — Assessment & Plan Note (Addendum)
Improving.  Presumed secondary to viral resp tract infection.  I see no strong indication for FU CXR.  Will check BMP and CBC in one week as per DC summary. ?

## 2021-10-27 NOTE — Patient Instructions (Signed)
I am glad you are doing better ?Come in for a lab only appointment Friday or next Monday. ?I don't think you need a follow up chest x-ray as long as you continue to get better. ?I will call next week to check on you and give you the lab test results.   ?I sent in a prescription to treat a urine infection even though this may be asymptomatic bacteruria.   ?

## 2021-10-27 NOTE — Progress Notes (Signed)
? ? ?  SUBJECTIVE:  ? ?CHIEF COMPLAINT / HPI:  ? ?Hosp follow up.  Had a two day hospitalization for hypoxia related to a presumed viral respiratory tract infection.  She feels better.  No fever.  Still with cough.  No appetite and is forcing herself to eat.  DCed on mucolytic and azithromicin.   ?Reviewed DC summary. Recommended,BMP and CBC.  Also recommended FU CXR.  Only finding on hosp CXR was known lung cyst. ?Urine culture in the hospital is now growing >100K e coli.  Pan sensitive.  No urgency or frequency.  No flank pain ? ?OBJECTIVE:  ? ?BP (!) 150/64   Pulse 69   Ht '5\' 1"'$  (1.549 m)   Wt 118 lb 9.6 oz (53.8 kg)   SpO2 95%   BMI 22.41 kg/m?   ? ?Neck supple,  ?Lungs right sided end exp wheeze.  No increased work of breathing ?Cardiac RRR without m or g ? ?ASSESSMENT/PLAN:  ? ?Acute respiratory failure with hypoxia ([place]) ?Improving.  Presumed secondary to viral resp tract infection.  I see no strong indication for FU CXR.  Will check BMP and CBC in one week as per DC summary. ? ?UTI (urinary tract infection) ?Likely asymptomatic bacteruria.  Did have weakness and fever, which could be symptoms.  As such, will treat with amox.   ?  ? ? ?[name], MD ?[place]  ?

## 2021-10-27 NOTE — Assessment & Plan Note (Signed)
Likely asymptomatic bacteruria.  Did have weakness and fever, which could be symptoms.  As such, will treat with amox.   ?

## 2021-10-28 NOTE — Telephone Encounter (Signed)
Patient contacted regarding discharge from [facility] on 10/24/2021. ? ?Patient understands to follow up with provider Fabian Sharp on 11/06/21 at 1:15 at [facility]. ?Patient understands discharge instructions? Yes. ?Patient understands medications and regiment? Yes. ?Patient understands to bring all medications to this visit? Yes. ? ?Ask patient:  Are you enrolled in My Chart? Yes. ? ?

## 2021-10-31 DIAGNOSIS — M545 Low back pain, unspecified: Secondary | ICD-10-CM | POA: Diagnosis not present

## 2021-11-03 ENCOUNTER — Other Ambulatory Visit: Payer: HMO

## 2021-11-03 DIAGNOSIS — I10 Essential (primary) hypertension: Secondary | ICD-10-CM | POA: Diagnosis not present

## 2021-11-04 ENCOUNTER — Telehealth: Payer: Self-pay | Admitting: Family Medicine

## 2021-11-04 LAB — CBC
Hematocrit: 34.7 % (ref 34.0–46.6)
Hemoglobin: 11.6 g/dL (ref 11.1–15.9)
MCH: 32 pg (ref 26.6–33.0)
MCHC: 33.4 g/dL (ref 31.5–35.7)
MCV: 96 fL (ref 79–97)
Platelets: 293 10*3/uL (ref 150–450)
RBC: 3.62 x10E6/uL — ABNORMAL LOW (ref 3.77–5.28)
RDW: 12.2 % (ref 11.7–15.4)
WBC: 3.6 10*3/uL (ref 3.4–10.8)

## 2021-11-04 LAB — BASIC METABOLIC PANEL
BUN/Creatinine Ratio: 14 (ref 12–28)
BUN: 23 mg/dL (ref 8–27)
CO2: 20 mmol/L (ref 20–29)
Calcium: 9.6 mg/dL (ref 8.7–10.3)
Chloride: 103 mmol/L (ref 96–106)
Creatinine, Ser: 1.69 mg/dL — ABNORMAL HIGH (ref 0.57–1.00)
Glucose: 93 mg/dL (ref 70–99)
Potassium: 4.6 mmol/L (ref 3.5–5.2)
Sodium: 137 mmol/L (ref 134–144)
eGFR: 29 mL/min/{1.73_m2} — ABNORMAL LOW (ref >59)

## 2021-11-04 NOTE — Telephone Encounter (Signed)
Called patient with labs.   ?She feels better from a respiratory standpoint. ?Had a fall last week when her rolling walker rolled her into her dresser.  Had eval and normal xrays.  She is also recovering from the fall. ?I confirmed she is not taking any OTC NSAID. ?I think her creat is her new CKD baseline.   ?Offer PT, she does not think she needs it. ?No changes.   ?

## 2021-11-06 ENCOUNTER — Ambulatory Visit: Payer: HMO | Admitting: Physician Assistant

## 2021-11-06 ENCOUNTER — Encounter: Payer: Self-pay | Admitting: Physician Assistant

## 2021-11-06 VITALS — BP 110/70 | HR 96 | Ht 61.0 in | Wt 116.6 lb

## 2021-11-06 DIAGNOSIS — I480 Paroxysmal atrial fibrillation: Secondary | ICD-10-CM

## 2021-11-06 DIAGNOSIS — Z7901 Long term (current) use of anticoagulants: Secondary | ICD-10-CM | POA: Diagnosis not present

## 2021-11-06 DIAGNOSIS — I951 Orthostatic hypotension: Secondary | ICD-10-CM

## 2021-11-06 DIAGNOSIS — I129 Hypertensive chronic kidney disease with stage 1 through stage 4 chronic kidney disease, or unspecified chronic kidney disease: Secondary | ICD-10-CM

## 2021-11-06 DIAGNOSIS — N1832 Chronic kidney disease, stage 3b: Secondary | ICD-10-CM

## 2021-11-06 DIAGNOSIS — I5032 Chronic diastolic (congestive) heart failure: Secondary | ICD-10-CM | POA: Diagnosis not present

## 2021-11-06 MED ORDER — HYDRALAZINE HCL 25 MG PO TABS: 25.0000 mg | ORAL_TABLET | ORAL | 2 refills | Status: DC | PRN

## 2021-11-06 NOTE — Patient Instructions (Signed)
Medication Instructions:  ?Take hydralazine 25 mg as needed for SBP greater than 160. ?*If you need a refill on your cardiac medications before your next appointment, please call your pharmacy* ? ? ?Follow-Up: ?At [facility], you and your health needs are our priority.  As part of our continuing mission to provide you with exceptional heart care, we have created designated Provider Care Teams.  These Care Teams include your primary Cardiologist (physician) and Advanced Practice Providers (APPs -  Physician Assistants and Nurse Practitioners) who all work together to provide you with the care you need, when you need it. ? ?We recommend signing up for the patient portal called "MyChart".  Sign up information is provided on this After Visit Summary.  MyChart is used to connect with patients for Virtual Visits (Telemedicine).  Patients are able to view lab/test results, encounter notes, upcoming appointments, etc.  Non-urgent messages can be sent to your provider as well.   ?To learn more about what you can do with MyChart, go to [email or web address].   ? ?Your next appointment:   ? Tuesday April 25th at 8:25 am  ? ?The format for your next appointment:   ?In Person ? ?Provider:   ?Fabian Sharp, PA-C     ? ? ? ?

## 2021-11-11 NOTE — Progress Notes (Deleted)
Cardiology Office Note:    Date:  11/11/2021   ID:  [name], DOB Dec 11, 1930, MRN [id number]  PCP:  [name], MD   [facility] Providers Cardiologist:  Sanda Klein, MD Cardiology APP:  Ledora Bottcher, PA { Referring MD: [name], MD   No chief complaint on file. ***  History of Present Illness:    Jacqueline [name] is a 88 y.o. female with a hx of hypertension, orthostatic hypotension with falls, PAF/flutter, chronic anticoagulation, CAD s/p CABG in 2006, HFpEF, and hyperlipidemia.  She has historically not tolerated a beta-blocker or higher doses of amiodarone.  She has been quite stable on 200 mg amnio daily.  She is anticoagulated with 2.5 mg Eliquis twice daily for age and renal function.  She was maintained on 25 mg hydralazine 3 times daily for hypertension with a goal SBP of 160.  Unfortunately she has orthostatic symptoms with a blood pressure below 120.  Given her orthostatic hypotension, antianginal and diuretic therapy has been limited.  She has as needed midodrine to use at home.  She is very good at tracking her blood pressure.  She recently called our office 09/2021 with elevated BP on 25 mg 3 times daily hydralazine was increased to 37.5 mg 3 times daily.  She presented to [facility] with symptoms of URI and tested positive for rhinovirus. Cardiology was consulted for RVR. Telemetry with bouts of Afib and sinus rhythm.  She is asymptomatic with her RVR.  No medication changes were made, she was continued on 200 mg amiodarone.  Her pressure remained in the 140s to 150s without hydralazine.  She was discharged without hydralazine and instructions to track her blood pressure until follow-up.  I saw her for post-hospital follow up on 11/06/21. She was still taking 37.5 mg hydralazine TID.  She reported dizziness daily and one mechanical fall. BP log revealed pressures in the 100-130s, which is low for her and places her at higher risk for orthostatic  symptoms. Given her BP goal of 150-160s, I adjusted her regimen to:  25 mg hydralazine for SBP > 160. Take midodrine for SBP less than 130. If she takes hydralazine, she will recheck her BP approximately 8 hours later to see if she needs to take additional hydralazine. I was worried about these PRN instructions and asked her to keep close follow up.   She presents back for follow up today and is here with      Atrial fibrillation with RVR Persistent atrial fibrillation Continue amiodarone 200 mg daily.    Chronic anticoagulation Continue low-dose Eliquis for age and renal function.  No signs of bleeding.   Hypertension Orthostatic hypotension with falls Hydralazine was not held at discharge as originally instructed. I suspected this was excessively lowering her BP. Through shared decision making, I wrote instructions for hydralazine. She has been ___ BP has been ____   Chronic diastolic heart failure She received gentle diuresis during her hospitalization but was not discharged on scheduled Lasix. Appears euvolemic on exam.           Past Medical History:  Diagnosis Date   Arthritis    back   Atrial fibrillation with rapid ventricular response ([place]) 02/2014   a. CHA2DS2VASc = 6 -> on eliquis;  b. 02/2014 s/p DCCV;  c. 08/2014 Echo: EF 55-60%, Gr 2 DD, mild MR, triv AI.   CAD (coronary artery disease)    a. s/p CABG x 2 (LIMA->LAD, VG->Diag);  b. 08/2014 Cath:  LM nl, LAD 90p, LCX nl, RCA nl/dominant, LIMA->LAD atretic, VG->D2 patent w/ retrograde filling of LAD, EF 55-60%-->Med Rx.   Diverticulitis 02/21/2012   GERD (gastroesophageal reflux disease)    Hyperlipemia    Hypertension    Insomnia     Past Surgical History:  Procedure Laterality Date   ABDOMINAL HYSTERECTOMY  1975   CARDIAC CATHETERIZATION N/A 05/09/2015   Procedure: Right Heart Cath;  Surgeon: Larey Dresser, MD;  Location: [place] CV LAB;  Service: Cardiovascular;  Laterality: N/A;   CARDIOVERSION  N/A 02/28/2014   Procedure: CARDIOVERSION;  Surgeon: Sanda Klein, MD;  Location: [facility] ENDOSCOPY;  Service: Cardiovascular;  Laterality: N/A;   CARDIOVERSION N/A 01/30/2019   Procedure: CARDIOVERSION;  Surgeon: Fay Records, MD;  Location: [place];  Service: Cardiovascular;  Laterality: N/A;   CATARACT EXTRACTION Bilateral 2014   with lens implanted   COLONOSCOPY N/A 02/19/2015   Procedure: COLONOSCOPY;  Surgeon: Ladene Artist, MD;  Location: [facility] ENDOSCOPY;  Service: Endoscopy;  Laterality: N/A;   CORONARY ARTERY BYPASS GRAFT  2006   off-pump bypass surgery with LIMA to the LAD and SVG to the second diagonal artery ( performed by Dr Servando Snare)    Scalp Level Bilateral    L-2004, R-2006   LEFT HEART CATHETERIZATION WITH CORONARY/GRAFT ANGIOGRAM N/A 09/17/2014   Procedure: LEFT HEART CATHETERIZATION WITH Beatrix Fetters;  Surgeon: Troy Sine, MD;  Location: [facility] CATH LAB;  Service: Cardiovascular;  Laterality: N/A;   REIMPLANTATION OF TOTAL KNEE Right 10/08/2014   Procedure: REIMPLANTATION OF RIGHT TOTAL KNEE ARTHROPLASTY WITH REMOVAL OF ANTIBIOTIC SPACER;  Surgeon: Paralee Cancel, MD;  Location: [facility];  Service: Orthopedics;  Laterality: Right;   ROTATOR CUFF REPAIR Bilateral    r-1999, l- 2005   TEE WITHOUT CARDIOVERSION N/A 02/28/2014   Procedure: TRANSESOPHAGEAL ECHOCARDIOGRAM (TEE);  Surgeon: Sanda Klein, MD;  Location: [facility] ENDOSCOPY;  Service: Cardiovascular;  Laterality: N/A;   TEE WITHOUT CARDIOVERSION N/A 01/30/2019   Procedure: TRANSESOPHAGEAL ECHOCARDIOGRAM (TEE);  Surgeon: Fay Records, MD;  Location: [facility] ENDOSCOPY;  Service: Cardiovascular;  Laterality: N/A;   TOTAL KNEE ARTHROPLASTY Right 07/02/2014   Procedure: Resection of Infected Right Total Knee Arthroplasty with placement antibiotic spacer;  Surgeon: Mauri Pole, MD;  Location: [facility] ORS;  Service: Orthopedics;  Laterality: Right;    Current Medications: No outpatient medications have been marked as taking for  the 11/18/21 encounter (Appointment) with Ledora Bottcher, [place].     Allergies:   Atorvastatin, Beta adrenergic blockers, Crestor [rosuvastatin], Morphine and related, and Prednisone   Social History   Socioeconomic History   Marital status: Widowed    Spouse name: Not on file   Number of children: 2   Years of education: 10   Highest education level: 10th grade  Occupational History   Not on file  Tobacco Use   Smoking status: Never   Smokeless tobacco: Never  Vaping Use   Vaping Use: Never used  Substance and Sexual Activity   Alcohol use: No   Drug use: No   Sexual activity: Not Currently    Birth control/protection: Post-menopausal  Other Topics Concern   Not on file  Social History Narrative   Current Social History 05/11/2017           Patient lives alone in one level home    Transportation: Patient has own vehicle and drives herself    Important Relationships Children, grandchildren, great-grandchildren and daughter-in-law are all nearby   Pets: None 05/11/2017  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 Drinks water and one cup of coffee daily.   Religious / Personal Beliefs: Baptist. "I believe Jesus died on the cross to save me [id number] my sins. I am saved by the grace of God."    Other: "I have a good life and am happy. My family is there when I need help."                                                              Social Determinants of Health   Financial Resource Strain: Not on file  Food Insecurity: Not on file  Transportation Needs: Not on file  Physical Activity: Not on file  Stress: Not on file  Social Connections: Not on file     Family History: The patient's ***family history includes Arthritis in her brother; Cancer in her brother; Heart attack in her brother; Parkinson's disease in her mother. There  is no history of Breast cancer.  ROS:   Please see the history of present illness.    *** All other systems reviewed and are negative.  EKGs/Labs/Other Studies Reviewed:    The following studies were reviewed today: ***  EKG:  EKG is *** ordered today.  The ekg ordered today demonstrates ***  Recent Labs: 10/23/2021: TSH 1.353 10/24/2021: ALT 50; B Natriuretic Peptide 138.7; Magnesium 1.9 11/03/2021: BUN 23; Creatinine, Ser 1.69; Hemoglobin 11.6; Platelets 293; Potassium 4.6; Sodium 137  Recent Lipid Panel    Component Value Date/Time   CHOL 147 10/20/2021 1107   TRIG 139 10/20/2021 1107   HDL 62 10/20/2021 1107   CHOLHDL 2.4 10/20/2021 1107   CHOLHDL 2.8 04/08/2016 1153   VLDL 26 04/08/2016 1153   LDLCALC 61 10/20/2021 1107     Risk Assessment/Calculations:   {Does this patient have ATRIAL FIBRILLATION?:[phone]}       Physical Exam:    VS:  There were no vitals taken for this visit.    Wt Readings from Last 3 Encounters:  11/06/21 116 lb 9.6 oz (52.9 kg)  10/27/21 118 lb 9.6 oz (53.8 kg)  10/23/21 116 lb 10 oz (52.9 kg)     GEN: *** Well nourished, well developed in no acute distress HEENT: Normal NECK: No JVD; No carotid bruits LYMPHATICS: No lymphadenopathy CARDIAC: ***RRR, no murmurs, rubs, gallops RESPIRATORY:  Clear to auscultation without rales, wheezing or rhonchi  ABDOMEN: Soft, non-tender, non-distended MUSCULOSKELETAL:  No edema; No deformity  SKIN: Warm and dry NEUROLOGIC:  Alert and oriented x 3 PSYCHIATRIC:  Normal affect   ASSESSMENT:    No diagnosis found. PLAN:    In order of problems listed above:  ***      {Are you ordering a CV Procedure (e.g. stress test, cath, DCCV, TEE, etc)?   Press F2        :[id number]}    Medication Adjustments/Labs and Tests Ordered: Current medicines are reviewed at length with the patient today.  Concerns regarding medicines are outlined above.  No orders of the defined types were placed in this  encounter.  No orders of the defined types were placed in this encounter.  There are no Patient Instructions on file for this visit.   Signed, Ledora Bottcher, [place]  11/11/2021 8:04 AM    [facility]

## 2021-11-12 DIAGNOSIS — H16223 Keratoconjunctivitis sicca, not specified as Sjogren's, bilateral: Secondary | ICD-10-CM | POA: Diagnosis not present

## 2021-11-12 DIAGNOSIS — H04123 Dry eye syndrome of bilateral lacrimal glands: Secondary | ICD-10-CM | POA: Diagnosis not present

## 2021-11-12 DIAGNOSIS — Z961 Presence of intraocular lens: Secondary | ICD-10-CM | POA: Diagnosis not present

## 2021-11-13 ENCOUNTER — Telehealth: Payer: Self-pay | Admitting: Cardiovascular Disease

## 2021-11-13 DIAGNOSIS — D692 Other nonthrombocytopenic purpura: Secondary | ICD-10-CM | POA: Diagnosis not present

## 2021-11-13 DIAGNOSIS — Z6821 Body mass index (BMI) 21.0-21.9, adult: Secondary | ICD-10-CM | POA: Diagnosis not present

## 2021-11-13 DIAGNOSIS — I5030 Unspecified diastolic (congestive) heart failure: Secondary | ICD-10-CM | POA: Diagnosis not present

## 2021-11-13 DIAGNOSIS — Z888 Allergy status to other drugs, medicaments and biological substances status: Secondary | ICD-10-CM | POA: Diagnosis not present

## 2021-11-13 DIAGNOSIS — Z7901 Long term (current) use of anticoagulants: Secondary | ICD-10-CM | POA: Diagnosis not present

## 2021-11-13 DIAGNOSIS — I130 Hypertensive heart and chronic kidney disease with heart failure and stage 1 through stage 4 chronic kidney disease, or unspecified chronic kidney disease: Secondary | ICD-10-CM | POA: Diagnosis not present

## 2021-11-13 DIAGNOSIS — I480 Paroxysmal atrial fibrillation: Secondary | ICD-10-CM | POA: Diagnosis not present

## 2021-11-13 DIAGNOSIS — I2510 Atherosclerotic heart disease of native coronary artery without angina pectoris: Secondary | ICD-10-CM | POA: Diagnosis not present

## 2021-11-13 DIAGNOSIS — D6869 Other thrombophilia: Secondary | ICD-10-CM | POA: Diagnosis not present

## 2021-11-13 DIAGNOSIS — I951 Orthostatic hypotension: Secondary | ICD-10-CM | POA: Diagnosis not present

## 2021-11-13 DIAGNOSIS — Z8679 Personal history of other diseases of the circulatory system: Secondary | ICD-10-CM | POA: Diagnosis not present

## 2021-11-13 DIAGNOSIS — N1832 Chronic kidney disease, stage 3b: Secondary | ICD-10-CM | POA: Diagnosis not present

## 2021-11-13 NOTE — Telephone Encounter (Signed)
Pt c/o BP issue: STAT if pt c/o blurred vision, one-sided weakness or slurred speech ? ?1. What are your last 5 BP readings?  ?170/82@ 2:20 pm ?182/84 ?211/89 @ 7:30 am ? ?2. Are you having any other symptoms (ex. Dizziness, headache, blurred vision, passed out)? Dizziness ? ?3. What is your BP issue? Nurse states that pt's BP has been continuously going up. Please advise ? ? ?

## 2021-11-13 NOTE — Telephone Encounter (Signed)
Called Theadora Rama the nurse with [facility] about her message. She wanted to collaborate with Fabian Sharp PA and let her know since patient's BPs have been up that she is advising patient to take her hydralazine as needed TID for SBP over 160. Patient took hydralazine this morning and this afternoon, due to elevated BP, now, 30 minutes after taking hydralazine patient's SBP is in the 160s. Will forward to [facility] PA, so she is aware. Theadora Rama stated that she can be contacted if there are any questions. ?

## 2021-11-16 ENCOUNTER — Other Ambulatory Visit: Payer: Self-pay

## 2021-11-16 ENCOUNTER — Encounter (HOSPITAL_COMMUNITY): Payer: Self-pay | Admitting: Emergency Medicine

## 2021-11-16 ENCOUNTER — Emergency Department (HOSPITAL_COMMUNITY)
Admission: EM | Admit: 2021-11-16 | Discharge: 2021-11-16 | Disposition: A | Discharge status: Home / Self Care | Payer: HMO | Attending: Emergency Medicine | Admitting: Emergency Medicine

## 2021-11-16 ENCOUNTER — Emergency Department (HOSPITAL_COMMUNITY): Payer: HMO

## 2021-11-16 DIAGNOSIS — R21 Rash and other nonspecific skin eruption: Secondary | ICD-10-CM | POA: Diagnosis present

## 2021-11-16 DIAGNOSIS — Z7901 Long term (current) use of anticoagulants: Secondary | ICD-10-CM | POA: Insufficient documentation

## 2021-11-16 DIAGNOSIS — B029 Zoster without complications: Secondary | ICD-10-CM

## 2021-11-16 DIAGNOSIS — R079 Chest pain, unspecified: Secondary | ICD-10-CM | POA: Insufficient documentation

## 2021-11-16 DIAGNOSIS — R0789 Other chest pain: Secondary | ICD-10-CM | POA: Diagnosis not present

## 2021-11-16 DIAGNOSIS — G609 Hereditary and idiopathic neuropathy, unspecified: Secondary | ICD-10-CM

## 2021-11-16 LAB — CBC
HCT: 36.1 % (ref 36.0–46.0)
Hemoglobin: 11.8 g/dL — ABNORMAL LOW (ref 12.0–15.0)
MCH: 32.4 pg (ref 26.0–34.0)
MCHC: 32.7 g/dL (ref 30.0–36.0)
MCV: 99.2 fL (ref 80.0–100.0)
Platelets: 181 10*3/uL (ref 150–400)
RBC: 3.64 MIL/uL — ABNORMAL LOW (ref 3.87–5.11)
RDW: 15.2 % (ref 11.5–15.5)
WBC: 3.5 10*3/uL — ABNORMAL LOW (ref 4.0–10.5)
nRBC: 0 % (ref 0.0–0.2)

## 2021-11-16 LAB — BASIC METABOLIC PANEL
Anion gap: 8 (ref 5–15)
BUN: 17 mg/dL (ref 8–23)
CO2: 22 mmol/L (ref 22–32)
Calcium: 9.4 mg/dL (ref 8.9–10.3)
Chloride: 110 mmol/L (ref 98–111)
Creatinine, Ser: 1.41 mg/dL — ABNORMAL HIGH (ref 0.44–1.00)
GFR, Estimated: 36 mL/min — ABNORMAL LOW (ref >60)
Glucose, Bld: 93 mg/dL (ref 70–99)
Potassium: 3.9 mmol/L (ref 3.5–5.1)
Sodium: 140 mmol/L (ref 135–145)

## 2021-11-16 LAB — TROPONIN I (HIGH SENSITIVITY): Troponin I (High Sensitivity): 13 ng/L (ref <18)

## 2021-11-16 MED ORDER — GABAPENTIN 300 MG PO CAPS: 300.0000 mg | ORAL_CAPSULE | Freq: Three times a day (TID) | ORAL | 0 refills | Status: DC

## 2021-11-16 MED ORDER — VALACYCLOVIR HCL 500 MG PO TABS
1000.0000 mg | ORAL_TABLET | Freq: Every day | ORAL | Status: DC
  Administered 2021-11-16: 1000 mg via ORAL
  Filled 2021-11-16 (×2): qty 2

## 2021-11-16 MED ORDER — LIDOCAINE HCL URETHRAL/MUCOSAL 2 % EX GEL
1.0000 "application " | Freq: Once | CUTANEOUS | Status: AC
  Administered 2021-11-16: 1 via TOPICAL
  Filled 2021-11-16: qty 11

## 2021-11-16 MED ORDER — VALACYCLOVIR HCL 1 G PO TABS
1000.0000 mg | ORAL_TABLET | Freq: Three times a day (TID) | ORAL | 0 refills | Status: AC
Start: 2021-11-16 — End: 2021-11-23

## 2021-11-16 MED ORDER — GABAPENTIN 600 MG PO TABS
300.0000 mg | ORAL_TABLET | Freq: Once | ORAL | Status: AC
  Administered 2021-11-16: 300 mg via ORAL
  Filled 2021-11-16: qty 1
  Filled 2021-11-16: qty 0.5

## 2021-11-16 NOTE — ED Triage Notes (Addendum)
Pt states she is here for high blood pressure.  Checked it at home this morning 201/-.  Also reports pain to L lateral chest and L breast since Wednesday. Denies SOB, nausea, and vomiting.  States she always has dizziness. ?

## 2021-11-16 NOTE — ED Provider Notes (Signed)
?[place] ?Provider Note ? ? ?CSN: [id number] ?Arrival date & time: 11/16/21  1038 ? ?  ? ?History ? ?Chief Complaint  ?Patient presents with  ? Chest Pain  ? ? ?Jacqueline [name] is a 88 y.o. female present emergency department with pain on the left side of her chest.  She noticed this about 4 days ago.  She developed a blistering rash under her left breast yesterday.  She is here with her sons at the bedside.  She denies any substernal chest pressure.  Per medical record review she was hospitalized for chest pain approximate 1 month ago and had troponins and a cardiac evaluation.  She reports this pain is extremely different.  It is worse with movement and palpation. ? ?HPI ? ?  ? ?Home Medications ?Prior to Admission medications   ?Medication Sig Start Date End Date Taking? Authorizing Provider  ?gabapentin (NEURONTIN) 300 MG capsule Take 1 capsule (300 mg total) by mouth 3 (three) times daily. 11/16/21 12/16/21 Yes Aharon Carriere, Carola Rhine, MD  ?valACYclovir (VALTREX) 1000 MG tablet Take 1 tablet (1,000 mg total) by mouth 3 (three) times daily for 7 days. 11/16/21 11/23/21 Yes Hokulani Rogel, Carola Rhine, MD  ?acetaminophen (TYLENOL) 500 MG tablet Take 1,000 mg by mouth every 6 (six) hours as needed for moderate pain or headache (pain/headache.).    [provider]  ?albuterol (VENTOLIN HFA) 108 (90 Base) MCG/ACT inhaler Inhale 2 puffs into the lungs every 6 (six) hours as needed for wheezing or shortness of breath. 10/24/21   Thurnell Lose, MD  ?ALPRAZolam Duanne Moron) 0.5 MG tablet TAKE 1 TABLET BY MOUTH TWICE A DAY AS NEEDED FOR ANXIETY ?Patient taking differently: Take 0.5 mg by mouth at bedtime. 06/23/21   [name], MD  ?amiodarone (PACERONE) 200 MG tablet TAKE 1 TABLET BY MOUTH EVERY DAY ?Patient taking differently: Take 200 mg by mouth daily. 03/05/21   [name], MD  ?amoxicillin (AMOXIL) 500 MG capsule Take 1 capsule (500 mg total) by mouth 3 (three) times daily.  10/27/21   [name], MD  ?apixaban (ELIQUIS) 2.5 MG TABS tablet Take 1 tablet (2.5 mg total) by mouth 2 (two) times daily. 06/17/21   Warren Lacy, PA-C  ?azithromycin (ZITHROMAX Z-PAK) 250 MG tablet Take 1 tablet by mouth daily until gone. 10/24/21   Thurnell Lose, MD  ?cholecalciferol (VITAMIN D3) 25 MCG (1000 UNIT) tablet Take 1,000 Units by mouth daily.    [provider]  ?cycloSPORINE (RESTASIS) 0.05 % ophthalmic emulsion Place 1 drop into both eyes 2 (two) times daily.    [provider]  ?gabapentin (NEURONTIN) 100 MG capsule TAKE 1 CAPSULE (100 MG TOTAL) BY MOUTH 3 (THREE) TIMES DAILY. 05/19/21   [name], MD  ?guaiFENesin (MUCINEX) 600 MG 12 hr tablet Take 1 tablet (600 mg total) by mouth 2 (two) times daily as needed for cough or to loosen phlegm. 10/24/21   Thurnell Lose, MD  ?hydrALAZINE (APRESOLINE) 25 MG tablet Take 1 tablet (25 mg total) by mouth as needed (for SBP greater than 160). 11/06/21   [facility], Tami Lin, PA  ?levothyroxine (SYNTHROID) 75 MCG tablet TAKE 1 TABLET BY MOUTH EVERY DAY ?Patient taking differently: Take 75 mcg by mouth daily before breakfast. 03/05/21   [name], MD  ?meclizine (ANTIVERT) 25 MG tablet Take 25 mg by mouth 3 (three) times daily as needed for dizziness.    [provider]  ?midodrine (PROAMATINE)  2.5 MG tablet TAKE 1 TABLET BY MOUTH IN MORNING AND 1 TAB 6 HOURS LATER, DO NOT LAY FLAT FOR 4 HOURS AFTER TAKING THE MEDICATION. DO NOT TAKE FOR A SYSTOLIC OF 970 OF ABOVE. ?Patient taking differently: Take 2.5 mg by mouth daily as needed (if blood pressure is 130 or below). 02/28/21   Croitoru, Mihai, MD  ?Multiple Vitamin (MULTIVITAMIN WITH MINERALS) TABS tablet Take 1 tablet by mouth daily.    [provider]  ?nitroGLYCERIN (NITROSTAT) 0.4 MG SL tablet PLACE 1 TABLET UNDER THE TONGUE EVERY 5 MINUTES AS NEEDED FOR CHEST PAIN UP TO 3 DOSES ?Patient taking differently: Place 0.4 mg under the tongue  every 5 (five) minutes as needed for chest pain. 12/24/20   Croitoru, Mihai, MD  ?Omega-3 Fatty Acids (FISH OIL) 1000 MG CAPS Take 1,000 mg by mouth every evening.    [provider]  ?omeprazole (PRILOSEC) 20 MG capsule TAKE 1 CAPSULE BY MOUTH EVERY DAY ?Patient taking differently: Take 20 mg by mouth daily. 12/12/20   [name], MD  ?Polyethyl Glycol-Propyl Glycol (LUBRICANT EYE DROPS) 0.4-0.3 % SOLN Place 1 drop into both eyes 4 (four) times daily.    [provider]  ?polyethylene glycol powder ([employer] PURELAX) 17 GM/SCOOP powder MIX 17 G AND TAKE BY MOUTH DAILY. ?Patient taking differently: Take 17 g by mouth daily as needed for mild constipation. 07/03/21   [name], MD  ?pravastatin (PRAVACHOL) 40 MG tablet TAKE 1 TABLET BY MOUTH EVERY DAY IN THE EVENING ?Patient taking differently: Take 40 mg by mouth every evening. 09/09/21   Croitoru, Mihai, MD  ?rOPINIRole (REQUIP) 0.25 MG tablet TAKE 1 TABLET BY MOUTH THREE TIMES A DAY ?Patient taking differently: Take 0.25 mg by mouth 3 (three) times daily. 05/06/21   [name], MD  ?traMADol (ULTRAM) 50 MG tablet TAKE 1 TABLET BY MOUTH EVERY 12 HOURS AS NEEDED FOR PAIN ?Patient taking differently: Take 50 mg by mouth at bedtime. 07/14/21   [name], MD  ?traZODone (DESYREL) 100 MG tablet TAKE 1 TABLET BY MOUTH EVERYDAY AT BEDTIME ?Patient taking differently: Take 100 mg by mouth at bedtime. 09/10/21   [name], MD  ?   ? ?Allergies    ?Atorvastatin, Beta adrenergic blockers, Crestor [rosuvastatin], Morphine and related, and Prednisone   ? ?Review of Systems   ?Review of Systems ? ?Physical Exam ?Updated Vital Signs ?BP (!) 194/67   Pulse (!) 55   Temp 97.6 ?F (36.4 ?C) (Oral)   Resp 16   SpO2 100%  ?Physical Exam ?Constitutional:   ?   General: She is not in acute distress. ?HENT:  ?   Head: Normocephalic and atraumatic.  ?Eyes:  ?   Conjunctiva/sclera: Conjunctivae normal.  ?   Pupils: Pupils are equal, round,  and reactive to light.  ?Cardiovascular:  ?   Rate and Rhythm: Normal rate and regular rhythm.  ?Pulmonary:  ?   Effort: Pulmonary effort is normal. No respiratory distress.  ?Chest:  ?   Comments: Blistering rash in dermatomal distribution left anterior/lateral chest, under left breast, tender ?Abdominal:  ?   General: There is no distension.  ?   Tenderness: There is no abdominal tenderness.  ?Skin: ?   General: Skin is warm and dry.  ?Neurological:  ?   General: No focal deficit present.  ?   Mental Status: She is alert. Mental status is at baseline.  ?Psychiatric:     ?   Mood  and Affect: Mood normal.     ?   Behavior: Behavior normal.  ? ? ?ED Results / Procedures / Treatments   ?Labs ?(all labs ordered are listed, but only abnormal results are displayed) ?Labs Reviewed  ?BASIC METABOLIC PANEL - Abnormal; Notable for the following components:  ?    Result Value  ? Creatinine, Ser 1.41 (*)   ? GFR, Estimated 36 (*)   ? All other components within normal limits  ?CBC - Abnormal; Notable for the following components:  ? WBC 3.5 (*)   ? RBC 3.64 (*)   ? Hemoglobin 11.8 (*)   ? All other components within normal limits  ?TROPONIN I (HIGH SENSITIVITY)  ?TROPONIN I (HIGH SENSITIVITY)  ? ? ?EKG ?EKG Interpretation ? ?Date/Time:  Sunday November 16 2021 11:14:33 EDT ?Ventricular Rate:  59 ?PR Interval:  151 ?QRS Duration: 94 ?QT Interval:  502 ?QTC Calculation: 498 ?R Axis:   52 ?Text Interpretation: Sinus or ectopic atrial rhythm Nonspecific T abnrm, anterolateral leads Borderline prolonged QT interval Confirmed by Octaviano Glow [phone]) on 11/16/2021 11:24:02 AM ? ?Radiology ?DG Chest 2 View ? ?Result Date: 11/16/2021 ?CLINICAL DATA:  Provided history: Chest pain. EXAM: CHEST - 2 VIEW COMPARISON:  Prior chest radiographs 10/24/2021 and earlier. Chest CT 06/07/2005. FINDINGS: Prior median sternotomy/CABG. Heart size within normal limits. Aortic atherosclerosis. Redemonstrated well-circumscribed round lucent focus within  the right mid lung consistent with known pulmonary cysts. No appreciable airspace consolidation or pulmonary edema. No evidence of pleural effusion or pneumothorax. No acute bony abnormality identified. Soft t

## 2021-11-16 NOTE — Discharge Instructions (Addendum)
Please schedule follow-up appoint with your doctor in 1 week.  Take the time to read over the instructions provided.  You should try to keep your rash covered up at all times, as it is contagious to others until the blisters have all scabbed over (likely 2 weeks). ?

## 2021-11-17 ENCOUNTER — Telehealth: Payer: Self-pay

## 2021-11-17 NOTE — Telephone Encounter (Signed)
Patient calls nurse line reporting she was diagnosed with shingles yesterday.  ? ?Patient reports the ED provider gave her Valtrex and Gabapentin. Patient reports she has been taking as prescribed, however is requesting a cream to put directly on the sores.  ? ?Will forward to PCP ?

## 2021-11-17 NOTE — Telephone Encounter (Signed)
I had already called paitent, who asked about a cream.  No topical medication until the skin has healed.  Patient aware. ?

## 2021-11-18 ENCOUNTER — Ambulatory Visit: Payer: HMO | Admitting: Physician Assistant

## 2021-11-18 ENCOUNTER — Telehealth: Payer: Self-pay | Admitting: *Deleted

## 2021-11-18 NOTE — Chronic Care Management (AMB) (Signed)
?  Care Management  ? ?Note ? ?11/18/2021 ?Name: Jacqueline [name] MRN: [id number] DOB: [birth year] ? ?Jacqueline [name] is a 88 y.o. year old female who is a primary care patient of Hensel, Jamal Collin, MD. I reached out to [employer] by phone today offer care coordination services.  ? ?[name] was given information about care management services today including:  ?Care management services include personalized support from designated clinical staff supervised by her physician, including individualized plan of care and coordination with other care providers ?24/7 contact phone numbers for assistance for urgent and routine care needs. ?The patient may stop care management services at any time by phone call to the office staff. ? ?Patient agreed to services and verbal consent obtained.  ? ?Follow up plan: ?Telephone appointment with care management team member scheduled for:11/21/21 ? ?Laverda Sorenson  ?Care Guide, Embedded Care Coordination ?[place]  Care Management  ?Direct Dial: [phone] ? ?

## 2021-11-21 ENCOUNTER — Telehealth: Payer: HMO

## 2021-11-24 ENCOUNTER — Telehealth: Payer: Self-pay

## 2021-11-24 DIAGNOSIS — K5909 Other constipation: Secondary | ICD-10-CM | POA: Diagnosis not present

## 2021-11-24 DIAGNOSIS — I10 Essential (primary) hypertension: Secondary | ICD-10-CM | POA: Diagnosis not present

## 2021-11-24 DIAGNOSIS — N390 Urinary tract infection, site not specified: Secondary | ICD-10-CM | POA: Diagnosis not present

## 2021-11-24 NOTE — Telephone Encounter (Signed)
? ?  RN Case Manager ?Care Management  ? Phone Outreach  ? ?Late Entry ?11/24/2021 ?Name: [name] MRN: [id number] DOB: 02-Jul-1931 ? ?Jacqueline [name] is a 88 y.o. year old female who is a primary care patient of Hensel, Jamal Collin, MD .  ? ?Telephone outreach was unsuccessful A HIPPA compliant phone message was left for the patient providing contact information and requesting a return call.  ? ?Follow Up Plan: Will route chart to Care Guide to see if patient would like to reschedule phone appointment.   ? ?Review of patient status, including review of consultants reports, relevant laboratory and other test results, and collaboration with appropriate care team members and the patient's provider was performed as part of comprehensive patient evaluation and provision of care management services.   ? ?Lazaro Arms RN, BSN, [facility] ?Care Management Coordinator ?[place] ?Phone: [phone] Fax: [phone] ?  ? ? ? ? ? ? ? ? ? ? ? ?

## 2021-12-03 ENCOUNTER — Ambulatory Visit: Payer: HMO | Admitting: Cardiovascular Disease

## 2021-12-03 ENCOUNTER — Encounter: Payer: Self-pay | Admitting: Cardiovascular Disease

## 2021-12-03 VITALS — BP 162/72 | HR 61 | Ht 61.0 in | Wt 120.4 lb

## 2021-12-03 DIAGNOSIS — E7800 Pure hypercholesterolemia, unspecified: Secondary | ICD-10-CM

## 2021-12-03 DIAGNOSIS — R9431 Abnormal electrocardiogram [ECG] [EKG]: Secondary | ICD-10-CM

## 2021-12-03 DIAGNOSIS — N184 Chronic kidney disease, stage 4 (severe): Secondary | ICD-10-CM

## 2021-12-03 DIAGNOSIS — Z5181 Encounter for therapeutic drug level monitoring: Secondary | ICD-10-CM

## 2021-12-03 DIAGNOSIS — I480 Paroxysmal atrial fibrillation: Secondary | ICD-10-CM | POA: Diagnosis not present

## 2021-12-03 DIAGNOSIS — I2510 Atherosclerotic heart disease of native coronary artery without angina pectoris: Secondary | ICD-10-CM

## 2021-12-03 DIAGNOSIS — Z79899 Other long term (current) drug therapy: Secondary | ICD-10-CM

## 2021-12-03 DIAGNOSIS — I951 Orthostatic hypotension: Secondary | ICD-10-CM | POA: Diagnosis not present

## 2021-12-03 DIAGNOSIS — I5032 Chronic diastolic (congestive) heart failure: Secondary | ICD-10-CM | POA: Diagnosis not present

## 2021-12-03 DIAGNOSIS — D6869 Other thrombophilia: Secondary | ICD-10-CM

## 2021-12-03 MED ORDER — AMLODIPINE BESYLATE 2.5 MG PO TABS: 2.5000 mg | ORAL_TABLET | Freq: Every day | ORAL | 3 refills | Status: DC

## 2021-12-03 MED ORDER — NITROGLYCERIN 0.4 MG SL SUBL: 0.4000 mg | SUBLINGUAL_TABLET | SUBLINGUAL | 2 refills | Status: AC | PRN

## 2021-12-03 NOTE — Progress Notes (Signed)
? ?Cardiology Office Note  ? ? ?Date:  12/09/2021  ? ?ID:  Jacqueline [name], DOB 04-05-1931, MRN [id number] ? ?PCP:  [name], MD  ?Cardiologist:  Jashua Knaak ?Electrophysiologist:  None  ? ?Evaluation Performed:  Follow-Up Visit ? ?Chief Complaint:  F/U AFib ? ?History of Present Illness:   ? ?Jacqueline [name] is a 88 y.o. female with coronary artery disease and previous bypass surgery (2006), paroxysmal atrial fibrillation requiring previous cardioversion, diastolic heart failure, HTN. ? ?She had left thoracic shingles in April from which she is still recovering.  Otherwise, she has not had any new serious medical problems.  Her biggest complaint is of poor appetite.  She denies shortness of breath or angina, but is quite sedentary.  Has not had dizziness, palpitations or syncope or any new falls.  Has not had lower extremity edema.  Denies orthopnea and PND. ? ?Has been checking her blood pressure at least once daily.  Her blood pressure is persistently elevated, never low typical recordings have been around 180/70-190/80.  This is despite the fact that she has been taking hydralazine 25 mg 3 times daily every day (this was prescribed "as needed" for systolic blood pressure of 160.  He does sometimes feel dizzy after taking the hydralazine.  She remains relatively bradycardic with heart rates typically in the 50s or low 60s. ? ?She had a protracted episode of persistent atrial fibrillation in summer 2020 on a low-dose of amiodarone.  The dose of amiodarone was increased back to 200 mg daily but before she could undergo cardioversion she presented with rectal bleeding due to hemorrhoids and diverticulosis which required an interruption in her anticoagulants.  She eventually underwent TEE guided cardioversion on July 6.  She had transient atrial fibrillation during urinary tract infection in October 2021. ? ? ? ?Past Medical History:  ?Diagnosis Date  ? Arthritis   ? back  ? Atrial fibrillation with rapid  ventricular response ([place]) 02/2014  ? a. CHA2DS2VASc = 6 -> on eliquis;  b. 02/2014 s/p DCCV;  c. 08/2014 Echo: EF 55-60%, Gr 2 DD, mild MR, triv AI.  ? CAD (coronary artery disease)   ? a. s/p CABG x 2 (LIMA->LAD, VG->Diag);  b. 08/2014 Cath: LM nl, LAD 90p, LCX nl, RCA nl/dominant, LIMA->LAD atretic, VG->D2 patent w/ retrograde filling of LAD, EF 55-60%-->Med Rx.  ? Diverticulitis 02/21/2012  ? GERD (gastroesophageal reflux disease)   ? Hyperlipemia   ? Hypertension   ? Insomnia   ? ?Past Surgical History:  ?Procedure Laterality Date  ? ABDOMINAL HYSTERECTOMY  1975  ? CARDIAC CATHETERIZATION N/A 05/09/2015  ? Procedure: Right Heart Cath;  Surgeon: Larey Dresser, MD;  Location: [place] [facility];  Service: Cardiovascular;  Laterality: N/A;  ? CARDIOVERSION N/A 02/28/2014  ? Procedure: CARDIOVERSION;  Surgeon: Sanda Klein, MD;  Location: [facility];  Service: Cardiovascular;  Laterality: N/A;  ? CARDIOVERSION N/A 01/30/2019  ? Procedure: CARDIOVERSION;  Surgeon: Fay Records, MD;  Location: [place];  Service: Cardiovascular;  Laterality: N/A;  ? CATARACT EXTRACTION Bilateral 2014  ? with lens implanted  ? COLONOSCOPY N/A 02/19/2015  ? Procedure: COLONOSCOPY;  Surgeon: Ladene Artist, MD;  Location: [facility];  Service: Endoscopy;  Laterality: N/A;  ? CORONARY ARTERY BYPASS GRAFT  2006  ? off-pump bypass surgery with LIMA to the LAD and SVG to the second diagonal artery ( performed by Dr Servando Snare)   ? JOINT REPLACEMENT Bilateral   ? L-2004, R-2006  ? LEFT  HEART CATHETERIZATION WITH CORONARY/GRAFT ANGIOGRAM N/A 09/17/2014  ? Procedure: LEFT HEART CATHETERIZATION WITH Beatrix Fetters;  Surgeon: Troy Sine, MD;  [facility];  Service: Cardiovascular;  Laterality: N/A;  ? REIMPLANTATION OF TOTAL KNEE Right 10/08/2014  ? Procedure: REIMPLANTATION OF RIGHT TOTAL KNEE ARTHROPLASTY WITH REMOVAL OF ANTIBIOTIC SPACER;  Surgeon: Paralee Cancel, MD;  Location: [facility];  Service: Orthopedics;   Laterality: Right;  ? ROTATOR CUFF REPAIR Bilateral   ? r-1999, l- 2005  ? TEE WITHOUT CARDIOVERSION N/A 02/28/2014  ? Procedure: TRANSESOPHAGEAL ECHOCARDIOGRAM (TEE);  Surgeon: Sanda Klein, MD;  Location: [place];  Service: Cardiovascular;  Laterality: N/A;  ? TEE WITHOUT CARDIOVERSION N/A 01/30/2019  ? Procedure: TRANSESOPHAGEAL ECHOCARDIOGRAM (TEE);  Surgeon: Fay Records, MD;  Location: [place];  Service: Cardiovascular;  Laterality: N/A;  ? TOTAL KNEE ARTHROPLASTY Right 07/02/2014  ? Procedure: Resection of Infected Right Total Knee Arthroplasty with placement antibiotic spacer;  Surgeon: Mauri Pole, MD;  Location: [facility];  Service: Orthopedics;  Laterality: Right;  ?  ? ?Current Meds  ?Medication Sig  ? acetaminophen (TYLENOL) 500 MG tablet Take 1,000 mg by mouth every 6 (six) hours as needed for moderate pain or headache (pain/headache.).  ? ALPRAZolam (XANAX) 0.5 MG tablet TAKE 1 TABLET BY MOUTH TWICE A DAY AS NEEDED FOR ANXIETY (Patient taking differently: Take 0.5 mg by mouth at bedtime.)  ? amiodarone (PACERONE) 200 MG tablet TAKE 1 TABLET BY MOUTH EVERY DAY (Patient taking differently: Take 200 mg by mouth daily.)  ? amLODipine (NORVASC) 2.5 MG tablet Take 1 tablet (2.5 mg total) by mouth daily.  ? amoxicillin (AMOXIL) 500 MG capsule Take 1 capsule (500 mg total) by mouth 3 (three) times daily.  ? apixaban (ELIQUIS) 2.5 MG TABS tablet Take 1 tablet (2.5 mg total) by mouth 2 (two) times daily.  ? cholecalciferol (VITAMIN D3) 25 MCG (1000 UNIT) tablet Take 1,000 Units by mouth daily.  ? cycloSPORINE (RESTASIS) 0.05 % ophthalmic emulsion Place 1 drop into both eyes 2 (two) times daily.  ? gabapentin (NEURONTIN) 100 MG capsule TAKE 1 CAPSULE (100 MG TOTAL) BY MOUTH 3 (THREE) TIMES DAILY.  ? hydrALAZINE (APRESOLINE) 25 MG tablet Take 1 tablet (25 mg total) by mouth as needed (for SBP greater than 160).  ? levothyroxine (SYNTHROID) 75 MCG tablet TAKE 1 TABLET BY MOUTH EVERY DAY (Patient taking  differently: Take 75 mcg by mouth daily before breakfast.)  ? meclizine (ANTIVERT) 25 MG tablet Take 25 mg by mouth 3 (three) times daily as needed for dizziness.  ? Multiple Vitamin (MULTIVITAMIN WITH MINERALS) TABS tablet Take 1 tablet by mouth daily.  ? Omega-3 Fatty Acids (FISH OIL) 1000 MG CAPS Take 1,000 mg by mouth every evening.  ? omeprazole (PRILOSEC) 20 MG capsule TAKE 1 CAPSULE BY MOUTH EVERY DAY (Patient taking differently: Take 20 mg by mouth daily.)  ? polyethylene glycol powder ([employer] PURELAX) 17 GM/SCOOP powder MIX 17 G AND TAKE BY MOUTH DAILY. (Patient taking differently: Take 17 g by mouth daily as needed for mild constipation.)  ? pravastatin (PRAVACHOL) 40 MG tablet TAKE 1 TABLET BY MOUTH EVERY DAY IN THE EVENING (Patient taking differently: Take 40 mg by mouth every evening.)  ? rOPINIRole (REQUIP) 0.25 MG tablet TAKE 1 TABLET BY MOUTH THREE TIMES A DAY (Patient taking differently: Take 0.25 mg by mouth 3 (three) times daily.)  ? traMADol (ULTRAM) 50 MG tablet TAKE 1 TABLET BY MOUTH EVERY 12 HOURS AS NEEDED FOR PAIN (Patient  taking differently: Take 50 mg by mouth at bedtime.)  ? traZODone (DESYREL) 100 MG tablet TAKE 1 TABLET BY MOUTH EVERYDAY AT BEDTIME (Patient taking differently: Take 100 mg by mouth at bedtime.)  ?  ? ?Allergies:   Atorvastatin, Beta adrenergic blockers, Crestor [rosuvastatin], Morphine and related, and Prednisone  ? ?Social History  ? ?Tobacco Use  ? Smoking status: Never  ? Smokeless tobacco: Never  ?Vaping Use  ? Vaping Use: Never used  ?Substance Use Topics  ? Alcohol use: No  ? Drug use: No  ?  ? ?Family Hx: ?The patient's family history includes Arthritis in her brother; Cancer in her brother; Heart attack in her brother; Parkinson's disease in her mother. There is no history of Breast cancer. ? ?ROS:   ?Please see the history of present illness.    ?All other systems are reviewed and are negative. ? ?Prior CV studies:   ?The following studies were reviewed  today: ? ?Notes from visit with Dr. Andria Frames on 06/10/2020 ? ?Echocardiogram 04/23/2020 ? ? ?1. Left ventricular ejection fraction, by estimation, is 55 to 60%. The left ventricle has normal function. The left ventricle has no re

## 2021-12-03 NOTE — Patient Instructions (Addendum)
Medication Instructions:  ?START Amlodipine 2.5 mg once daily ? ?Eliquis: Samples given: LOT: [id number]. EXP: 03/2022  3 Boxes ? ?*If you need a refill on your cardiac medications before your next appointment, please call your pharmacy* ? ? ?Lab Work: ?None ordered ?If you have labs (blood work) drawn today and your tests are completely normal, you will receive your results only by: ?MyChart Message (if you have MyChart) OR ?A paper copy in the mail ?If you have any lab test that is abnormal or we need to change your treatment, we will call you to review the results. ? ? ?Testing/Procedures: ?None ordered ? ? ?Follow-Up: ?At [facility], you and your health needs are our priority.  As part of our continuing mission to provide you with exceptional heart care, we have created designated Provider Care Teams.  These Care Teams include your primary Cardiologist (physician) and Advanced Practice Providers (APPs -  Physician Assistants and Nurse Practitioners) who all work together to provide you with the care you need, when you need it. ? ?We recommend signing up for the patient portal called "MyChart".  Sign up information is provided on this After Visit Summary.  MyChart is used to connect with patients for Virtual Visits (Telemedicine).  Patients are able to view lab/test results, encounter notes, upcoming appointments, etc.  Non-urgent messages can be sent to your provider as well.   ?To learn more about what you can do with MyChart, go to [email or web address].   ? ?Your next appointment:   ?12 month(s) ? ?The format for your next appointment:   ?In Person ? ?Provider:   ?Sanda Klein, MD { ? ? ?Important Information About [place] ? ? ? ? ? ? ?

## 2021-12-08 ENCOUNTER — Telehealth: Payer: Self-pay

## 2021-12-08 ENCOUNTER — Telehealth: Payer: HMO

## 2021-12-08 NOTE — Telephone Encounter (Signed)
? ?  RN Case Manager ?Care Management  ? Phone Outreach  ? ? ?12/08/2021 ?Name: [name] MRN: [id number] DOB: [birth year] ? ?[name] is a 88 y.o. year old female who is a primary care patient of Hensel, Jamal Collin, MD .  ? ?A HIPPA compliant phone message was left for the patient providing contact information and requesting a return call.  2nd unsuccessful telephone outreach attempt.  If unable to reach patient by phone on the 3rd attempt, will discontinue outreach calls but will be available at any time to provide services.  ? ?Follow Up Plan: Will route chart to Care Guide to see if patient would like to reschedule phone appointment.   ? ?Review of patient status, including review of consultants reports, relevant laboratory and other test results, and collaboration with appropriate care team members and the patient's provider was performed as part of comprehensive patient evaluation and provision of care management services.   ? ?Lazaro Arms RN, BSN, [facility] ?Care Management Coordinator ?[place] ?Phone: [phone] Fax: [phone] ?  ? ? ? ? ? ? ? ? ? ? ? ?

## 2021-12-17 DIAGNOSIS — D692 Other nonthrombocytopenic purpura: Secondary | ICD-10-CM | POA: Diagnosis not present

## 2021-12-17 DIAGNOSIS — D6869 Other thrombophilia: Secondary | ICD-10-CM | POA: Diagnosis not present

## 2021-12-17 DIAGNOSIS — I5030 Unspecified diastolic (congestive) heart failure: Secondary | ICD-10-CM | POA: Diagnosis not present

## 2021-12-17 DIAGNOSIS — Z7901 Long term (current) use of anticoagulants: Secondary | ICD-10-CM | POA: Diagnosis not present

## 2021-12-17 DIAGNOSIS — I110 Hypertensive heart disease with heart failure: Secondary | ICD-10-CM | POA: Diagnosis not present

## 2021-12-17 DIAGNOSIS — Z8679 Personal history of other diseases of the circulatory system: Secondary | ICD-10-CM | POA: Diagnosis not present

## 2021-12-17 DIAGNOSIS — I480 Paroxysmal atrial fibrillation: Secondary | ICD-10-CM | POA: Diagnosis not present

## 2021-12-17 DIAGNOSIS — Z515 Encounter for palliative care: Secondary | ICD-10-CM | POA: Diagnosis not present

## 2021-12-17 DIAGNOSIS — Z6822 Body mass index (BMI) 22.0-22.9, adult: Secondary | ICD-10-CM | POA: Diagnosis not present

## 2021-12-19 ENCOUNTER — Ambulatory Visit: Payer: HMO

## 2021-12-19 NOTE — Patient Instructions (Signed)
[name]  it was nice speaking with you. Please call me directly [phone] if you have questions about the goals we discussed.   Goals Addressed   None     Patient Care Plan: RN Case Manager     Problem Identified: General Plan of Care in a patient with Hypertension      Long-Range Goal: The patient will learn ways to manage ,monitor, an prevent a hypertensive crisis.   Start Date: 12/19/2021  Expected End Date: 05/26/2022  Priority: High  Note:   Current Barriers:  Chronic Disease Management support and education needs related to HTN  RNCM Clinical Goal(s):  Patient will verbalize understanding of plan for management of HTN as evidenced by lowering her blood pressure to acceptable levels   through collaboration with RN Care manager, provider, and care team.   Interventions: 1:1 collaboration with primary care provider regarding development and update of comprehensive plan of care as evidenced by provider attestation and co-signature Inter-disciplinary care team collaboration (see longitudinal plan of care) Evaluation of current treatment plan related to  self management and patient's adherence to plan as established by provider   Hypertension: (Status: New goal.) Long Term Goal  Last practice recorded BP readings:  BP Readings from Last 3 Encounters:  12/03/21 (!) 162/72  11/16/21 (!) 194/67  11/06/21 110/70  Most recent eGFR/CrCl:  Lab Results  Component Value Date   EGFR 29 (L) 11/03/2021    No components found for: CRCL  Evaluation of current treatment plan related to hypertension self management and patient's adherence to plan as established by provider;   Reviewed prescribed diet Reviewed medications with patient and discussed importance of compliance;  Discussed plans with patient for ongoing care management follow up and provided patient with direct contact information for care management team; Advised patient, providing education and rationale, to monitor  blood pressure daily and record, calling PCP for findings outside established parameters;  12/19/21:  During our conversation, I discussed with Jacqueline [name] my role in the care coordination program and my colleagues' role. She showed interest in participating since she lives alone. While she can manage most of her daily activities, she has someone to help her clean, and her son Marguerite Olea takes care of her medications. Her family members also assist her with appointments, groceries, and prescriptions. We discussed her blood pressure, which she monitors twice daily - before and after taking her medication. This morning, before her medication, it was 176/82 with a pulse of 65. We also discussed her diet and sodium intake, and I offered to send her some materials about hypertension to discuss further during our following conversation.  Patient Goals/Self Care Activities: -Patient/Caregiver will self-administer medications as prescribed as evidenced by self-report/primary caregiver report  -Patient/Caregiver will attend all scheduled provider appointments as evidenced by clinician review of documented attendance to scheduled appointments and patient/caregiver report -Patient/Caregiver will call pharmacy for medication refills as evidenced by patient report and review of pharmacy fill history as appropriate -Patient/Caregiver will call provider office for new concerns or questions as evidenced by review of documented incoming telephone call notes and patient report -Patient/Caregiver verbalizes understanding of plan -Patient/Caregiver will focus on medication adherence by taking medications as prescribed  -Calls provider office for new concerns, questions, or BP outside discussed parameters -Checks BP and records as discussed -Follows a low sodium diet/DASH diet          Jacqueline [name] received Care Management services today:  Care Management services include personalized support from designated  clinical staff  supervised by her physician, including individualized plan of care and coordination with other care providers 24/7 contact [phone] for assistance for urgent and routine care needs. Care Management are voluntary services and be declined at any time by calling the office.  The patient verbalized understanding of instructions provided today and agreed to receive a mailed copy of patient instruction and/or educational materials.   Follow Up Plan: Patient would like continued follow-up.  CCM RNCM will outreach the patient within the next 2 weeks.  Patient will call office if needed prior to next encounter   Lazaro Arms, RN   [phone]

## 2021-12-19 NOTE — Chronic Care Management (AMB) (Signed)
RNCM Care Management Note 12/19/2021 Name: Jacqueline [name] MRN: [id number] DOB: 1931-01-31   Jacqueline [name] is a 88 y.o. year old female who sees Hensel, Jamal Collin, MD for primary care. RNCM was consulted to assistance patient with  Care Coordination.      Engaged with patient by telephone for initial visit in response to provider referral for   Summary: Patient is making progress with chronic conditions , but currently is experiencing difficulty with elevated blood pressures . See Care Plan below for interventions and patient self-care actives.  Recommendation: The patient may benefit from taking medications as prescribed, checking blood pressures and record values, calling your physician if numbers are abnormal, and The patient agrees.  Follow up Plan: Patient would like continued follow-up.  CCM RNCM will outreach the patient within the next 2 weeks.  Patient will call office if needed prior to next encounter   SDOH (Social Determinants of Health) screening and interventions performed today: No     RN Plan of Care for Conditions/Un Met Needs Addressed and Monitored  Care Plan : RN Case Manager  Updates made by Lazaro Arms, RN since 12/19/2021 12:00 AM     Problem: General Plan of Care in a patient with Hypertension      Long-Range Goal: The patient will learn ways to manage ,monitor, an prevent a hypertensive crisis.   Start Date: 12/19/2021  Expected End Date: 05/26/2022  Priority: High  Note:   Current Barriers:  Chronic Disease Management support and education needs related to HTN  RNCM Clinical Goal(s):  Patient will verbalize understanding of plan for management of HTN as evidenced by lowering her blood pressure to acceptable levels   through collaboration with RN Care manager, provider, and care team.   Interventions: 1:1 collaboration with primary care provider regarding development and update of comprehensive plan of care as evidenced by provider attestation  and co-signature Inter-disciplinary care team collaboration (see longitudinal plan of care) Evaluation of current treatment plan related to  self management and patient's adherence to plan as established by provider   Hypertension: (Status: New goal.) Long Term Goal  Last practice recorded BP readings:  BP Readings from Last 3 Encounters:  12/03/21 (!) 162/72  11/16/21 (!) 194/67  11/06/21 110/70  Most recent eGFR/CrCl:  Lab Results  Component Value Date   EGFR 29 (L) 11/03/2021    No components found for: CRCL  Evaluation of current treatment plan related to hypertension self management and patient's adherence to plan as established by provider;   Reviewed prescribed diet Reviewed medications with patient and discussed importance of compliance;  Discussed plans with patient for ongoing care management follow up and provided patient with direct contact information for care management team; Advised patient, providing education and rationale, to monitor blood pressure daily and record, calling PCP for findings outside established parameters;  12/19/21:  During our conversation, I discussed with Mrs. Moehring my role in the care coordination program and my colleagues' role. She showed interest in participating since she lives alone. While she can manage most of her daily activities, she has someone to help her clean, and her son Marguerite Olea takes care of her medications. Her family members also assist her with appointments, groceries, and prescriptions. We discussed her blood pressure, which she monitors twice daily - before and after taking her medication. This morning, before her medication, it was 176/82 with a pulse of 65. We also discussed her diet and sodium intake, and I offered to send  her some materials about hypertension to discuss further during our following conversation.  Patient Goals/Self Care Activities: -Patient/Caregiver will self-administer medications as prescribed as evidenced by  self-report/primary caregiver report  -Patient/Caregiver will attend all scheduled provider appointments as evidenced by clinician review of documented attendance to scheduled appointments and patient/caregiver report -Patient/Caregiver will call pharmacy for medication refills as evidenced by patient report and review of pharmacy fill history as appropriate -Patient/Caregiver will call provider office for new concerns or questions as evidenced by review of documented incoming telephone call notes and patient report -Patient/Caregiver verbalizes understanding of plan -Patient/Caregiver will focus on medication adherence by taking medications as prescribed  -Calls provider office for new concerns, questions, or BP outside discussed parameters -Checks BP and records as discussed -Follows a low sodium diet/DASH diet         Lazaro Arms RN, BSN, [facility] Management Coordinator [facility]  Phone: [phone]

## 2021-12-22 ENCOUNTER — Other Ambulatory Visit: Payer: Self-pay | Admitting: Family Medicine

## 2021-12-22 DIAGNOSIS — F4329 Adjustment disorder with other symptoms: Secondary | ICD-10-CM

## 2021-12-30 ENCOUNTER — Encounter: Payer: Self-pay | Admitting: *Deleted

## 2022-01-02 ENCOUNTER — Ambulatory Visit: Payer: HMO

## 2022-01-02 NOTE — Chronic Care Management (AMB) (Signed)
Chronic Care Management   CCM RN Visit Note  01/02/2022 Name: Jacqueline [name] MRN: [id number] DOB: 24-Oct-1930  Subjective: Jacqueline [name] is a 88 y.o. year old female who is a primary care patient of Hensel, Jamal Collin, MD. The care management team was consulted for assistance with disease management and care coordination needs.    Engaged with patient by telephone for follow up visit in response to provider referral for case management and/or care coordination services.   Consent to Services:  The patient was given information about Chronic Care Management services, agreed to services, and gave verbal consent prior to initiation of services.  Please see initial visit note for detailed documentation.   Patient agreed to services and verbal consent obtained.    Summary: Patient is making progress with her blood pressure , but currently is experiencing difficulty with elevated blood pressures when she wakes in the morning but they will lower after taking her medication. . See Care Plan below for interventions and patient self-care actives.  Recommendation: The patient may benefit from taking medications as prescribed, exercising as tolerated, checking blood pressures and record values, calling your physician if numbers are abnormal, and The patient agrees.  Follow up Plan: Patient would like continued follow-up.  CCM RNCM will outreach the patient within the next 4 weeks.  Patient will call office if needed prior to next encounter      Assessment: Review of patient past medical history, allergies, medications, health status, including review of consultants reports, laboratory and other test data, was performed as part of comprehensive evaluation and provision of chronic care management services.   SDOH (Social Determinants of Health) assessments and interventions performed: No     Conditions to be addressed/monitored:HTN  Care Plan : RN Case Manager  Updates made by Lazaro Arms,  RN since 01/02/2022 12:00 AM     Problem: General Plan of Care in a patient with Hypertension      Long-Range Goal: The patient will learn ways to manage ,monitor, an prevent a hypertensive crisis.   Start Date: 12/19/2021  Expected End Date: 05/26/2022  Priority: High  Note:   Current Barriers:  Chronic Disease Management support and education needs related to HTN  RNCM Clinical Goal(s):  Patient will verbalize understanding of plan for management of HTN as evidenced by lowering her blood pressure to acceptable levels   through collaboration with RN Care manager, provider, and care team.   Interventions: 1:1 collaboration with primary care provider regarding development and update of comprehensive plan of care as evidenced by provider attestation and co-signature Inter-disciplinary care team collaboration (see longitudinal plan of care) Evaluation of current treatment plan related to  self management and patient's adherence to plan as established by provider   Hypertension: (Status: New goal.) Long Term Goal  Last practice recorded BP readings:  BP Readings from Last 3 Encounters:  12/03/21 (!) 162/72  11/16/21 (!) 194/67  11/06/21 110/70  Most recent eGFR/CrCl:  Lab Results  Component Value Date   EGFR 29 (L) 11/03/2021    No components found for: "CRCL"  Evaluation of current treatment plan related to hypertension self management and patient's adherence to plan as established by provider;   Reviewed prescribed diet Reviewed medications with patient and discussed importance of compliance;  Discussed plans with patient for ongoing care management follow up and provided patient with direct contact information for care management team; Advised patient, providing education and rationale, to monitor blood pressure daily and record, calling PCP  for findings outside established parameters;  01/02/22:  The patient reported feeling good this morning and continues to monitor her blood  pressure. Before taking her medication, her blood pressure was 176/81, with a pulse of 65. She has not yet taken her medication today. Yesterday, her blood pressure was 188/83 before taking her medicine and 132/67, with a pulse of 62 after taking it. She denies experiencing headaches, chest pain, blurred vision, or flushing. She is unsure why her blood pressure is high in the mornings despite having had a meal consisting of corn on the cob, hamburger steak, squash the night before and drinking a boost before bed. She engages in light exercise by cooking, walking to the mailbox, and washing dishes. She also has someone to help clean her home. She requested an appointment with Dr. Andria Frames for a check-up, and I assisted her in scheduling one for 01/08/22 at 10:50 AM.conversation. The patient received educational material mailed to her.  Patient Goals/Self Care Activities: -Patient/Caregiver will self-administer medications as prescribed as evidenced by self-report/primary caregiver report  -Patient/Caregiver will attend all scheduled provider appointments as evidenced by clinician review of documented attendance to scheduled appointments and patient/caregiver report -Patient/Caregiver will call pharmacy for medication refills as evidenced by patient report and review of pharmacy fill history as appropriate -Patient/Caregiver will call provider office for new concerns or questions as evidenced by review of documented incoming telephone call notes and patient report -Patient/Caregiver verbalizes understanding of plan -Patient/Caregiver will focus on medication adherence by taking medications as prescribed  -Calls provider office for new concerns, questions, or BP outside discussed parameters -Checks BP and records as discussed -Follows a low sodium diet/DASH diet        Lazaro Arms RN, BSN, [place] Management Coordinator [facility]  Phone: [phone]

## 2022-01-02 NOTE — Patient Instructions (Signed)
Visit Information  Ms. Sikorski  it was nice speaking with you. Please call me directly [phone] if you have questions about the goals we discussed.  Patient Goals/Self Care Activities: -Patient/Caregiver will self-administer medications as prescribed as evidenced by self-report/primary caregiver report  -Patient/Caregiver will attend all scheduled provider appointments as evidenced by clinician review of documented attendance to scheduled appointments and patient/caregiver report -Patient/Caregiver will call pharmacy for medication refills as evidenced by patient report and review of pharmacy fill history as appropriate -Patient/Caregiver will call provider office for new concerns or questions as evidenced by review of documented incoming telephone call notes and patient report -Patient/Caregiver verbalizes understanding of plan -Patient/Caregiver will focus on medication adherence by taking medications as prescribed  -Calls provider office for new concerns, questions, or BP outside discussed parameters -Checks BP and records as discussed -Follows a low sodium diet/DASH diet        Patient verbalizes understanding of instructions and care plan provided today and agrees to view in [place]. Active MyChart status and patient understanding of how to access instructions and care plan via MyChart confirmed with patient.     Follow up Plan: Patient would like continued follow-up.  CCM RNCM will outreach the patient within the next 4 weeks.  Patient will call office if needed prior to next encounter  Lazaro Arms, RN  [phone]

## 2022-01-05 ENCOUNTER — Other Ambulatory Visit: Payer: Self-pay | Admitting: Family Medicine

## 2022-01-05 DIAGNOSIS — K219 Gastro-esophageal reflux disease without esophagitis: Secondary | ICD-10-CM

## 2022-01-08 ENCOUNTER — Encounter: Payer: Self-pay | Admitting: Family Medicine

## 2022-01-08 ENCOUNTER — Ambulatory Visit (INDEPENDENT_AMBULATORY_CARE_PROVIDER_SITE_OTHER): Payer: HMO | Admitting: Family Medicine

## 2022-01-08 DIAGNOSIS — I5032 Chronic diastolic (congestive) heart failure: Secondary | ICD-10-CM

## 2022-01-08 DIAGNOSIS — R531 Weakness: Secondary | ICD-10-CM

## 2022-01-08 DIAGNOSIS — I10 Essential (primary) hypertension: Secondary | ICD-10-CM

## 2022-01-08 DIAGNOSIS — I480 Paroxysmal atrial fibrillation: Secondary | ICD-10-CM | POA: Diagnosis not present

## 2022-01-08 MED ORDER — ZOSTER VAC RECOMB ADJUVANTED 50 MCG/0.5ML IM SUSR: 0.5000 mL | Freq: Once | INTRAMUSCULAR | 1 refills | Status: AC

## 2022-01-08 MED ORDER — HYDRALAZINE HCL 25 MG PO TABS: 25.0000 mg | ORAL_TABLET | Freq: Two times a day (BID) | ORAL | 2 refills | Status: DC

## 2022-01-08 NOTE — Assessment & Plan Note (Signed)
Stable.  Offered another round of PT.  She declined and feels she is doing as best as can be expected.

## 2022-01-08 NOTE — Progress Notes (Signed)
    SUBJECTIVE:   CHIEF COMPLAINT / HPI:   FU hypertension and weakness. Patient has wide pulse pressure systolic hypertension complicated by low diastolics presumed contributing to her weakness and dizziness.  We seem to have found the sweet spot for now.  Not dizzy.  BPs have been OK.  Less dizziness. Frailty/weakness/wt loss.  Weight has stabilized.  She is still a fall risk.  Uses a cane for balance.  Does not think PT would help. A fib on anticoag.  Has an impressive black ey (right) today.  Clothes washer lid fell unexpectedly and struck her above the right eye.  No trauma to globe.  No vision change. Prompted discussion of again of risks and benefits of anticoag. Diastolic CHF.  No leg swelling.  No worsening DOE.    OBJECTIVE:   BP (!) 142/58   Pulse 63   Ht '5\' 1"'$  (1.549 m)   Wt 119 lb 12.8 oz (54.3 kg)   SpO2 100%   BMI 22.64 kg/m   EMOI, no hyphema Lungs clear Cardiac RRR without m or g Ext no edema ASSESSMENT/PLAN:   Paroxysmal atrial fibrillation ([name]) She will remain on DOAC for now.  Revisit risk benefit ratio if more falls.  Hypertension Doing well on current meds  Chronic diastolic CHF (congestive heart failure) (H[facility]) Stable on current meds.  No evidence of volume overload.  Weakness generalized Stable.  Offered another round of PT.  She declined and feels she is doing as best as can be expected.     Jacqueline Resides, MD [place]

## 2022-01-08 NOTE — Assessment & Plan Note (Signed)
Doing well on current meds.

## 2022-01-08 NOTE — Patient Instructions (Signed)
Things look good. No med changes. Let me know if you fall more.  Stay on the blood thinner for now.  We will reevaluate if more falls. I sent in the shingles vaccine. See me late Sept or early Oct for a BP check and flu shot.

## 2022-01-08 NOTE — Assessment & Plan Note (Signed)
She will remain on DOAC for now.  Revisit risk benefit ratio if more falls.

## 2022-01-08 NOTE — Assessment & Plan Note (Signed)
Stable on current meds.  No evidence of volume overload.

## 2022-01-16 ENCOUNTER — Other Ambulatory Visit: Payer: Self-pay | Admitting: Family Medicine

## 2022-01-23 ENCOUNTER — Telehealth: Payer: Self-pay | Admitting: Cardiovascular Disease

## 2022-01-23 NOTE — Telephone Encounter (Signed)
Pt c/o medication issue:  1. Name of Medication:   apixaban (ELIQUIS) 2.5 MG TABS tablet    2. How are you currently taking this medication (dosage and times per day)? Take 1 tablet (2.5 mg total) by mouth 2 (two) times daily.  3. Are you having a reaction (difficulty breathing--STAT)? No  4. What is your medication issue? Pt states that her son faxed over paper work from [employer] to be filled out for medication. Pt states that she is running low on medication and would like if the paperwork could be filled out and sent over so that she is able to receive assistance. Please advise

## 2022-01-23 NOTE — Telephone Encounter (Signed)
Patient made aware that the provider portion of the assistance has been faxed.   Medication Samples have been provided to the patient.  Drug name: Eliquis       Strength: 2.5 mg        Qty: 3 boxes  LOT: [id number] Exp.Date: 4/204

## 2022-01-28 DIAGNOSIS — H16223 Keratoconjunctivitis sicca, not specified as Sjogren's, bilateral: Secondary | ICD-10-CM | POA: Diagnosis not present

## 2022-01-28 DIAGNOSIS — H04123 Dry eye syndrome of bilateral lacrimal glands: Secondary | ICD-10-CM | POA: Diagnosis not present

## 2022-01-28 DIAGNOSIS — Z961 Presence of intraocular lens: Secondary | ICD-10-CM | POA: Diagnosis not present

## 2022-01-29 DIAGNOSIS — D6869 Other thrombophilia: Secondary | ICD-10-CM | POA: Diagnosis not present

## 2022-01-29 DIAGNOSIS — Z7901 Long term (current) use of anticoagulants: Secondary | ICD-10-CM | POA: Diagnosis not present

## 2022-01-29 DIAGNOSIS — R42 Dizziness and giddiness: Secondary | ICD-10-CM | POA: Diagnosis not present

## 2022-01-29 DIAGNOSIS — I130 Hypertensive heart and chronic kidney disease with heart failure and stage 1 through stage 4 chronic kidney disease, or unspecified chronic kidney disease: Secondary | ICD-10-CM | POA: Diagnosis not present

## 2022-01-29 DIAGNOSIS — I480 Paroxysmal atrial fibrillation: Secondary | ICD-10-CM | POA: Diagnosis not present

## 2022-01-29 DIAGNOSIS — D692 Other nonthrombocytopenic purpura: Secondary | ICD-10-CM | POA: Diagnosis not present

## 2022-01-29 DIAGNOSIS — N1832 Chronic kidney disease, stage 3b: Secondary | ICD-10-CM | POA: Diagnosis not present

## 2022-01-29 DIAGNOSIS — Z8679 Personal history of other diseases of the circulatory system: Secondary | ICD-10-CM | POA: Diagnosis not present

## 2022-01-29 DIAGNOSIS — I509 Heart failure, unspecified: Secondary | ICD-10-CM | POA: Diagnosis not present

## 2022-02-02 ENCOUNTER — Telehealth: Payer: Self-pay | Admitting: Family Medicine

## 2022-02-02 ENCOUNTER — Ambulatory Visit: Payer: HMO

## 2022-02-02 ENCOUNTER — Telehealth: Payer: Self-pay | Admitting: *Deleted

## 2022-02-02 NOTE — Telephone Encounter (Signed)
Left a message for the patient to call back.  The patient will also need to re-fax their portion of the Eliquis assistance. They did not mark number in household. The patient has these forms and faxed them herself. The office does not have a copy of the patient portion.

## 2022-02-02 NOTE — Telephone Encounter (Signed)
Called: Feeling great over the past month.  Much less dizzy.  BPs running high in mornings 768-115 range systolic.  Does not know what afternoon BPs run.  She has been a tightrope between over and under treating BP.  She will take afternoon BPs and call me next week.  I would like better control of her high systolics.

## 2022-02-02 NOTE — Patient Instructions (Signed)
Visit Information  Jacqueline [name]  it was nice speaking with you. Please call me directly [phone] if you have questions about the goals we discussed.  Patient Goals/Self Care Activities: -Patient/Caregiver will self-administer medications as prescribed as evidenced by self-report/primary caregiver report  -Patient/Caregiver will attend all scheduled provider appointments as evidenced by clinician review of documented attendance to scheduled appointments and patient/caregiver report -Patient/Caregiver will call pharmacy for medication refills as evidenced by patient report and review of pharmacy fill history as appropriate -Patient/Caregiver will call provider office for new concerns or questions as evidenced by review of documented incoming telephone call notes and patient report -Patient/Caregiver verbalizes understanding of plan -Patient/Caregiver will focus on medication adherence by taking medications as prescribed  -Calls provider office for new concerns, questions, or BP outside discussed parameters -Checks BP and records as discussed -Follows a low sodium diet/DASH diet    Patient verbalizes understanding of instructions and care plan provided today and agrees to view in [place]. Active MyChart status and patient understanding of how to access instructions and care plan via MyChart confirmed with patient.     Follow up Plan: Patient would like continued follow-up.  CM RNCM will outreach the patient within the next 4 weeks.  Patient will call office if needed prior to next encounter  Lazaro Arms, RN  [phone]

## 2022-02-02 NOTE — Telephone Encounter (Signed)
-----   Message from Sanda Klein, MD sent at 02/02/2022  2:40 PM EDT ----- Reviewed the patient's blood pressure readings.  Please increase the amlodipine to 5 mg once daily and send another blood pressure log in 2 weeks.   Please confirm that she is no longer taking midodrine.  Please remove it from her list of medications.

## 2022-02-02 NOTE — Telephone Encounter (Signed)
-----   Message from Lazaro Arms, RN sent at 02/02/2022 11:54 AM EDT ----- Regarding: Bllod pressure  Dr. Andria Frames,  I had a conversation with Mrs. Dunlevy earlier today, and she conveyed that she is feeling good. However, on Sunday night, she experienced chest pain, and she wasn't sure if it was a dream or not. She woke up feeling fine, but her systolic blood pressure has been fluctuating between 167 and 183 before taking her morning pills. Today, after taking her pills, her blood pressure was 156/85, and her pulse was 61. I promised her that I would inform you of this information.  Thank you Traci

## 2022-02-02 NOTE — Chronic Care Management (AMB) (Signed)
RNCM Care Management Note 02/02/2022 Name: Jacqueline [name] MRN: [id number] DOB: [birth year]   Jacqueline [name] is a 88 y.o. year old female who sees [name], Jacqueline Collin, MD for primary care. RNCM was consulted to assistance patient with  Care Coordination.      Engaged with patient by telephone for follow up visit in response to provider referral for Care Coordination.     Summary: Patient is making progress with chronic conditions , but currently is experiencing difficulty with elevated blood prfessure . See Care Plan below for interventions and patient self-care actives.  Recommendation: The patient may benefit from taking medications as prescribed, checking blood pressures and record values, calling your physician if numbers are abnormal, and The patient agrees.  Follow up Plan: Patient would like continued follow-up.  CM RNCM will outreach the patient within the next 4 weeks.  Patient will call office if needed prior to next encounter   SDOH (Social Determinants of Health) screening and interventions performed today: No     RN Plan of Care for Conditions/Un Met Needs Addressed and Monitored  Patient Care Plan: RN Case Manager     Problem Identified: General Plan of Care in a patient with Hypertension      Long-Range Goal: The patient will learn ways to manage ,monitor, an prevent a hypertensive crisis.   Start Date: 12/19/2021  Expected End Date: 05/26/2022  Priority: High  Note:   Current Barriers:  Chronic Disease Management support and education needs related to HTN  RNCM Clinical Goal(s):  Patient will verbalize understanding of plan for management of HTN as evidenced by lowering her blood pressure to acceptable levels   through collaboration with RN Care manager, provider, and care team.   Interventions: 1:1 collaboration with primary care provider regarding development and update of comprehensive plan of care as evidenced by provider attestation and  co-signature Inter-disciplinary care team collaboration (see longitudinal plan of care) Evaluation of current treatment plan related to  self management and patient's adherence to plan as established by provider   Hypertension: (Status: New goal.) Long Term Goal  Last practice recorded BP readings:  BP Readings from Last 3 Encounters:  01/08/22 (!) 142/58  12/03/21 (!) 162/72  11/16/21 (!) 194/67  Most recent eGFR/CrCl:  Lab Results  Component Value Date   EGFR 29 (L) 11/03/2021    No components found for: "CRCL"  Evaluation of current treatment plan related to hypertension self management and patient's adherence to plan as established by provider;   Reviewed prescribed diet Reviewed medications with patient and discussed importance of compliance;  Discussed plans with patient for ongoing care management follow up and provided patient with direct contact information for care management team; Advised patient, providing education and rationale, to monitor blood pressure daily and record, calling PCP for findings outside established parameters;  02/02/22:  I spoke with Jacqueline [name] earlier today, and she said she is feeling good. However, she experienced chest pain on Sunday night and wasn't sure if it was a dream. She woke up feeling fine, but her systolic blood pressure fluctuated between 167 and 183 before taking her morning pills. Today, after taking her pills, her blood pressure was 156/85, and her pulse was 61. I promised her that I would inform Dr. Andria Frames of this information.   Patient Goals/Self Care Activities: -Patient/Caregiver will self-administer medications as prescribed as evidenced by self-report/primary caregiver report  -Patient/Caregiver will attend all scheduled provider appointments as evidenced by clinician review of documented attendance  to scheduled appointments and patient/caregiver report -Patient/Caregiver will call pharmacy for medication refills as evidenced by  patient report and review of pharmacy fill history as appropriate -Patient/Caregiver will call provider office for new concerns or questions as evidenced by review of documented incoming telephone call notes and patient report -Patient/Caregiver verbalizes understanding of plan -Patient/Caregiver will focus on medication adherence by taking medications as prescribed  -Calls provider office for new concerns, questions, or BP outside discussed parameters -Checks BP and records as discussed -Follows a low sodium diet/DASH diet           Lazaro Arms RN, BSN, [place] Management Coordinator [facility]  Phone: [phone]

## 2022-02-06 ENCOUNTER — Encounter (HOSPITAL_COMMUNITY): Payer: Self-pay | Admitting: Radiology

## 2022-02-06 ENCOUNTER — Emergency Department (HOSPITAL_COMMUNITY)
Admission: EM | Admit: 2022-02-06 | Discharge: 2022-02-06 | Disposition: A | Discharge status: Home / Self Care | Payer: PPO | Attending: Emergency Medicine | Admitting: Emergency Medicine

## 2022-02-06 ENCOUNTER — Other Ambulatory Visit: Payer: Self-pay

## 2022-02-06 ENCOUNTER — Emergency Department (HOSPITAL_COMMUNITY): Payer: PPO

## 2022-02-06 DIAGNOSIS — M25551 Pain in right hip: Secondary | ICD-10-CM | POA: Insufficient documentation

## 2022-02-06 DIAGNOSIS — Y92002 Bathroom of unspecified non-institutional (private) residence single-family (private) house as the place of occurrence of the external cause: Secondary | ICD-10-CM | POA: Insufficient documentation

## 2022-02-06 DIAGNOSIS — S2231XA Fracture of one rib, right side, initial encounter for closed fracture: Secondary | ICD-10-CM | POA: Diagnosis not present

## 2022-02-06 DIAGNOSIS — R6889 Other general symptoms and signs: Secondary | ICD-10-CM | POA: Diagnosis not present

## 2022-02-06 DIAGNOSIS — N200 Calculus of kidney: Secondary | ICD-10-CM | POA: Diagnosis not present

## 2022-02-06 DIAGNOSIS — Z043 Encounter for examination and observation following other accident: Secondary | ICD-10-CM | POA: Diagnosis not present

## 2022-02-06 DIAGNOSIS — W19XXXA Unspecified fall, initial encounter: Secondary | ICD-10-CM | POA: Diagnosis not present

## 2022-02-06 DIAGNOSIS — Z7901 Long term (current) use of anticoagulants: Secondary | ICD-10-CM | POA: Insufficient documentation

## 2022-02-06 DIAGNOSIS — I700 Atherosclerosis of aorta: Secondary | ICD-10-CM | POA: Insufficient documentation

## 2022-02-06 DIAGNOSIS — Z951 Presence of aortocoronary bypass graft: Secondary | ICD-10-CM | POA: Insufficient documentation

## 2022-02-06 DIAGNOSIS — W010XXA Fall on same level from slipping, tripping and stumbling without subsequent striking against object, initial encounter: Secondary | ICD-10-CM | POA: Insufficient documentation

## 2022-02-06 DIAGNOSIS — S0990XA Unspecified injury of head, initial encounter: Secondary | ICD-10-CM | POA: Diagnosis not present

## 2022-02-06 DIAGNOSIS — R109 Unspecified abdominal pain: Secondary | ICD-10-CM | POA: Diagnosis not present

## 2022-02-06 MED ORDER — AMLODIPINE BESYLATE 5 MG PO TABS: 5.0000 mg | ORAL_TABLET | Freq: Every day | ORAL | 3 refills | Status: DC

## 2022-02-06 MED ORDER — TRAMADOL HCL 50 MG PO TABS: 50.0000 mg | ORAL_TABLET | Freq: Four times a day (QID) | ORAL | 0 refills | Status: DC | PRN

## 2022-02-06 MED ORDER — TRAMADOL HCL 50 MG PO TABS
25.0000 mg | ORAL_TABLET | Freq: Once | ORAL | Status: AC
  Administered 2022-02-06: 25 mg via ORAL
  Filled 2022-02-06: qty 1

## 2022-02-06 MED ORDER — ACETAMINOPHEN 325 MG PO TABS
650.0000 mg | ORAL_TABLET | Freq: Once | ORAL | Status: AC
  Administered 2022-02-06: 650 mg via ORAL
  Filled 2022-02-06: qty 2

## 2022-02-06 NOTE — Discharge Instructions (Addendum)
Call your primary care doctor or specialist as discussed in the next 2-3 days.  Take the tramadol you have at home as needed for your pain as you have a right-sided rib fracture today.  Return immediately back to the ER if:  Your symptoms worsen within the next 12-24 hours. You develop new symptoms such as new fevers, persistent vomiting, new pain, shortness of breath, or new weakness or numbness, or if you have any other concerns.

## 2022-02-06 NOTE — Telephone Encounter (Signed)
Patient has been made aware. New script has been sent it.  Her son has updated the assistance.

## 2022-02-06 NOTE — ED Provider Notes (Signed)
[facility] EMERGENCY DEPARTMENT Provider Note   CSN: [id number] Arrival date & time: 02/06/22  1945     History  Chief Complaint  Patient presents with   [place] is a 88 y.o. female.  Patient presents to ER chief complaint of right flank pain.  She states that she tripped and fell in the bathroom earlier today.  Initially had some mild pain but continued to have pain throughout the day so presents to the ER.  Denies loss of consciousness denies fevers vomiting cough or diarrhea.  Denies any other extremity pain.       Home Medications Prior to Admission medications   Medication Sig Start Date End Date Taking? Authorizing Provider  acetaminophen (TYLENOL) 500 MG tablet Take 1,000 mg by mouth every 6 (six) hours as needed for moderate pain or headache (pain/headache.).    [provider]  ALPRAZolam Duanne Moron) 0.5 MG tablet Take 1 tablet (0.5 mg total) by mouth at bedtime. 12/23/21   [name], MD  amiodarone (PACERONE) 200 MG tablet TAKE 1 TABLET BY MOUTH EVERY DAY Patient taking differently: Take 200 mg by mouth daily. 03/05/21   [name], MD  amLODipine (NORVASC) 5 MG tablet Take 1 tablet (5 mg total) by mouth daily. 02/06/22 02/01/23  Croitoru, Mihai, MD  apixaban (ELIQUIS) 2.5 MG TABS tablet Take 1 tablet (2.5 mg total) by mouth 2 (two) times daily. 06/17/21   Warren Lacy, PA-C  cholecalciferol (VITAMIN D3) 25 MCG (1000 UNIT) tablet Take 1,000 Units by mouth daily.    [provider]  cycloSPORINE (RESTASIS) 0.05 % ophthalmic emulsion Place 1 drop into both eyes 2 (two) times daily.    [provider]  gabapentin (NEURONTIN) 100 MG capsule TAKE 1 CAPSULE (100 MG TOTAL) BY MOUTH 3 (THREE) TIMES DAILY. 05/19/21   [name], MD  hydrALAZINE (APRESOLINE) 25 MG tablet Take 1 tablet (25 mg total) by mouth in the morning and at bedtime. 01/08/22   [name], MD  levothyroxine (SYNTHROID) 75  MCG tablet TAKE 1 TABLET BY MOUTH EVERY DAY Patient taking differently: Take 75 mcg by mouth daily before breakfast. 03/05/21   [name], MD  meclizine (ANTIVERT) 25 MG tablet Take 25 mg by mouth 3 (three) times daily as needed for dizziness.    [provider]  midodrine (PROAMATINE) 2.5 MG tablet TAKE 1 TABLET BY MOUTH IN MORNING AND 1 TAB 6 HOURS LATER, DO NOT LAY FLAT FOR 4 HOURS AFTER TAKING THE MEDICATION. DO NOT TAKE FOR A SYSTOLIC OF 824 OF ABOVE. 02/28/21   Croitoru, Mihai, MD  Multiple Vitamin (MULTIVITAMIN WITH MINERALS) TABS tablet Take 1 tablet by mouth daily.    [provider]  nitroGLYCERIN (NITROSTAT) 0.4 MG SL tablet Place 1 tablet (0.4 mg total) under the tongue every 5 (five) minutes as needed for chest pain. 12/03/21   Croitoru, Mihai, MD  Omega-3 Fatty Acids (FISH OIL) 1000 MG CAPS Take 1,000 mg by mouth every evening.    [provider]  omeprazole (PRILOSEC) 20 MG capsule TAKE 1 CAPSULE BY MOUTH EVERY DAY 01/05/22   Hensel, Jamal Collin, MD  Polyethyl Glycol-Propyl Glycol (LUBRICANT EYE DROPS) 0.4-0.3 % SOLN Place 1 drop into both eyes 4 (four) times daily.    [provider]  polyethylene glycol powder ([employer] PURELAX) 17 GM/SCOOP powder MIX 17 G AND TAKE BY MOUTH DAILY. Patient taking differently: Take 17 g by mouth daily  as needed for mild constipation. 07/03/21   [name], MD  pravastatin (PRAVACHOL) 40 MG tablet TAKE 1 TABLET BY MOUTH EVERY DAY IN THE EVENING Patient taking differently: Take 40 mg by mouth every evening. 09/09/21   Croitoru, Mihai, MD  rOPINIRole (REQUIP) 0.25 MG tablet TAKE 1 TABLET BY MOUTH THREE TIMES A DAY Patient taking differently: Take 0.25 mg by mouth 3 (three) times daily. 05/06/21   [name], MD  traMADol (ULTRAM) 50 MG tablet Take 1 tablet (50 mg total) by mouth at bedtime. 01/19/22   [name], MD  traZODone (DESYREL) 100 MG tablet TAKE 1 TABLET BY MOUTH EVERYDAY AT BEDTIME Patient  taking differently: Take 100 mg by mouth at bedtime. 09/10/21   [name], MD      Allergies    Atorvastatin, Beta adrenergic blockers, Crestor [rosuvastatin], Morphine and related, and Prednisone    Review of Systems   Review of Systems  Constitutional:  Negative for fever.  HENT:  Negative for ear pain.   Eyes:  Negative for pain.  Respiratory:  Negative for cough.   Cardiovascular:  Negative for chest pain.  Gastrointestinal:  Negative for abdominal pain.  Genitourinary:  Positive for flank pain.  Musculoskeletal:  Negative for back pain.  Skin:  Negative for rash.  Neurological:  Negative for headaches.    Physical Exam Updated Vital Signs BP (!) 175/66   Pulse (!) 59   Resp 13   Ht '5\' 1"'$  (1.549 m)   Wt 54 kg   SpO2 95%   BMI 22.48 kg/m  Physical Exam Constitutional:      General: She is not in acute distress.    Appearance: Normal appearance.  HENT:     Head: Normocephalic.     Nose: Nose normal.  Eyes:     Extraocular Movements: Extraocular movements intact.  Cardiovascular:     Rate and Rhythm: Normal rate.  Pulmonary:     Effort: Pulmonary effort is normal.  Musculoskeletal:        General: Normal range of motion.     Cervical back: Normal range of motion.     Comments: No CT or L-spine midline step-offs or tenderness.  Right flank tenderness present.  No abdominal tenderness on exam normal range of motion of bilateral shoulders elbows wrist and hips knees and ankles.  Neurological:     General: No focal deficit present.     Mental Status: She is alert. Mental status is at baseline.     ED Results / Procedures / Treatments   Labs (all labs ordered are listed, but only abnormal results are displayed) Labs Reviewed - No data to display  EKG None  Radiology CT ABDOMEN PELVIS WO CONTRAST  Result Date: 02/06/2022 CLINICAL DATA:  [place], right flank and right hip pain EXAM: CT ABDOMEN AND PELVIS WITHOUT CONTRAST TECHNIQUE: Multidetector CT  imaging of the abdomen and pelvis was performed following the standard protocol without IV contrast. Unenhanced CT was performed per clinician order. Lack of IV contrast limits sensitivity and specificity, especially for evaluation of abdominal/pelvic solid viscera. RADIATION DOSE REDUCTION: This exam was performed according to the departmental dose-optimization program which includes automated exposure control, adjustment of the mA and/or kV according to patient size and/or use of iterative reconstruction technique. COMPARISON:  02/21/2011 FINDINGS: Lower chest: A trace right pleural effusion. No airspace disease or pneumothorax. Hepatobiliary: Minimal higher attenuation within the dependent portion the gallbladder could reflect sludge. No evidence of cholelithiasis or  cholecystitis. Unremarkable unenhanced appearance of the liver. Pancreas: Unremarkable unenhanced appearance of the pancreas. Diffuse fatty atrophy. Spleen: Unremarkable unenhanced appearance. Adrenals/Urinary Tract: Punctate 3 mm nonobstructing right renal calculus. No left-sided calculi or obstructive uropathy. Bladder is moderately distended with no focal abnormalities. The adrenals are grossly normal. Stomach/Bowel: No bowel obstruction or ileus. Moderate retained stool throughout the colon consistent with constipation. No bowel wall thickening or inflammatory change. Vascular/Lymphatic: Aortic atherosclerosis. No enlarged abdominal or pelvic lymph nodes. Reproductive: Status post hysterectomy. No adnexal masses. Other: No free fluid or free intraperitoneal gas. No abdominal wall hernia. Musculoskeletal: Minimally displaced right posterior tenth rib fracture. No other acute bony abnormalities. Reconstructed images demonstrate no additional findings. IMPRESSION: 1. Minimally displaced right posterior tenth rib fracture, consistent with history of recent trauma. Trace right pleural effusion with no evidence of pneumothorax. 2. Moderate retained  stool throughout the colon consistent with constipation. 3. Nonobstructing 3 mm right renal calculus. 4. Gallbladder sludge. No evidence of cholelithiasis or cholecystitis. 5.  Aortic Atherosclerosis (ICD10-I70.0). Electronically Signed   By: Randa Ngo M.D.   On: 02/06/2022 21:58   CT Head Wo Contrast  Result Date: 02/06/2022 CLINICAL DATA:  Head trauma, moderate-severe EXAM: CT HEAD WITHOUT CONTRAST TECHNIQUE: Contiguous axial images were obtained from the base of the skull through the vertex without intravenous contrast. RADIATION DOSE REDUCTION: This exam was performed according to the departmental dose-optimization program which includes automated exposure control, adjustment of the mA and/or kV according to patient size and/or use of iterative reconstruction technique. COMPARISON:  02/09/2021 FINDINGS: Brain: Normal anatomic configuration. Parenchymal volume loss is commensurate with the patient's age. Stable, moderate periventricular white matter changes are present likely reflecting the sequela of small vessel ischemia. No abnormal intra or extra-axial mass lesion or fluid collection. No abnormal mass effect or midline shift. No evidence of acute intracranial hemorrhage or infarct. Ventricular size is normal. Cerebellum unremarkable. Vascular: No asymmetric hyperdense vasculature at the skull base. Skull: Intact Sinuses/Orbits: Paranasal sinuses are clear. Orbits are unremarkable. Other: Mastoid air cells and middle ear cavities are clear. IMPRESSION: 1. No acute intracranial hemorrhage or infarct. 2. Stable senescent changes. Electronically Signed   By: Fidela Salisbury M.D.   On: 02/06/2022 21:56   DG Chest Port 1 View  Result Date: 02/06/2022 CLINICAL DATA:  Fall. EXAM: PORTABLE CHEST 1 VIEW COMPARISON:  Chest radiograph dated 11/16/2021. FINDINGS: No focal consolidation, pleural effusion, pneumothorax. Relatively similar appearance of right pneumatocele. Top-normal cardiac silhouette. Median  sternotomy wires a postsurgical changes of CABG. Atherosclerotic calcification of the aorta. No acute osseous pathology. IMPRESSION: 1. No acute cardiopulmonary process. 2. Chronic right pneumatocele. Electronically Signed   By: Anner Crete M.D.   On: 02/06/2022 20:37    Procedures Procedures    Medications Ordered in ED Medications  acetaminophen (TYLENOL) tablet 650 mg (650 mg Oral Given 02/06/22 2047)  traMADol (ULTRAM) tablet 25 mg (25 mg Oral Given 02/06/22 2046)    ED Course/ Medical Decision Making/ A&P                           Medical Decision Making Amount and/or Complexity of Data Reviewed Radiology: ordered.  Risk OTC drugs. Prescription drug management.   Review of records show office visit July for 2023 for keratoconjunctivitis.  Cardiac monitoring showing sinus rhythm.  History from patient's family at bedside.  Work-up shows right 10th rib fracture.  No pneumothorax noted.  Recommending outpatient follow-up with primary care doctor  within 2 to 3 days.  Recommend immediate return for worsening symptoms or any additional concerns.        Final Clinical Impression(s) / ED Diagnoses Final diagnoses:  Closed fracture of one rib of right side, initial encounter    Rx / DC Orders ED Discharge Orders     None         Luna Fuse, MD 02/06/22 2256

## 2022-02-06 NOTE — ED Triage Notes (Signed)
Pt had a mechanical fall this morning in her bathroom. Pt did not hit her head. EMS came and evaluated her and she refused transport to the hospital at that time. Pt states she has became very sore and just wanted to get check out now. Pt has ambulated since the fall without difficulty.

## 2022-02-09 ENCOUNTER — Other Ambulatory Visit: Payer: Self-pay

## 2022-02-09 ENCOUNTER — Telehealth: Payer: Self-pay | Admitting: *Deleted

## 2022-02-09 ENCOUNTER — Emergency Department (HOSPITAL_COMMUNITY): Payer: PPO

## 2022-02-09 ENCOUNTER — Inpatient Hospital Stay (HOSPITAL_COMMUNITY)
Admission: EM | Admit: 2022-02-09 | Discharge: 2022-02-16 | DRG: 543 | Disposition: A | Discharge status: Skilled Nursing Facility | Payer: PPO | Attending: Internal Medicine | Admitting: Internal Medicine

## 2022-02-09 ENCOUNTER — Encounter (HOSPITAL_COMMUNITY): Payer: Self-pay | Admitting: Oncology

## 2022-02-09 DIAGNOSIS — J9811 Atelectasis: Secondary | ICD-10-CM | POA: Diagnosis present

## 2022-02-09 DIAGNOSIS — S32591D Other specified fracture of right pubis, subsequent encounter for fracture with routine healing: Secondary | ICD-10-CM

## 2022-02-09 DIAGNOSIS — K219 Gastro-esophageal reflux disease without esophagitis: Secondary | ICD-10-CM | POA: Diagnosis present

## 2022-02-09 DIAGNOSIS — E861 Hypovolemia: Secondary | ICD-10-CM | POA: Diagnosis present

## 2022-02-09 DIAGNOSIS — Z90710 Acquired absence of both cervix and uterus: Secondary | ICD-10-CM | POA: Diagnosis not present

## 2022-02-09 DIAGNOSIS — Z885 Allergy status to narcotic agent status: Secondary | ICD-10-CM | POA: Diagnosis not present

## 2022-02-09 DIAGNOSIS — Y92239 Unspecified place in hospital as the place of occurrence of the external cause: Secondary | ICD-10-CM | POA: Diagnosis not present

## 2022-02-09 DIAGNOSIS — E871 Hypo-osmolality and hyponatremia: Secondary | ICD-10-CM | POA: Diagnosis present

## 2022-02-09 DIAGNOSIS — M1990 Unspecified osteoarthritis, unspecified site: Secondary | ICD-10-CM | POA: Diagnosis present

## 2022-02-09 DIAGNOSIS — I10 Essential (primary) hypertension: Secondary | ICD-10-CM | POA: Diagnosis not present

## 2022-02-09 DIAGNOSIS — S2239XA Fracture of one rib, unspecified side, initial encounter for closed fracture: Secondary | ICD-10-CM | POA: Diagnosis present

## 2022-02-09 DIAGNOSIS — D631 Anemia in chronic kidney disease: Secondary | ICD-10-CM | POA: Diagnosis present

## 2022-02-09 DIAGNOSIS — W1830XA Fall on same level, unspecified, initial encounter: Secondary | ICD-10-CM | POA: Diagnosis present

## 2022-02-09 DIAGNOSIS — G4700 Insomnia, unspecified: Secondary | ICD-10-CM | POA: Diagnosis present

## 2022-02-09 DIAGNOSIS — M80851A Other osteoporosis with current pathological fracture, right femur, initial encounter for fracture: Principal | ICD-10-CM | POA: Diagnosis present

## 2022-02-09 DIAGNOSIS — I480 Paroxysmal atrial fibrillation: Secondary | ICD-10-CM | POA: Diagnosis present

## 2022-02-09 DIAGNOSIS — Z79890 Hormone replacement therapy (postmenopausal): Secondary | ICD-10-CM | POA: Diagnosis not present

## 2022-02-09 DIAGNOSIS — R278 Other lack of coordination: Secondary | ICD-10-CM | POA: Diagnosis not present

## 2022-02-09 DIAGNOSIS — Z8249 Family history of ischemic heart disease and other diseases of the circulatory system: Secondary | ICD-10-CM

## 2022-02-09 DIAGNOSIS — I130 Hypertensive heart and chronic kidney disease with heart failure and stage 1 through stage 4 chronic kidney disease, or unspecified chronic kidney disease: Secondary | ICD-10-CM | POA: Diagnosis present

## 2022-02-09 DIAGNOSIS — I495 Sick sinus syndrome: Secondary | ICD-10-CM | POA: Diagnosis present

## 2022-02-09 DIAGNOSIS — Z820 Family history of epilepsy and other diseases of the nervous system: Secondary | ICD-10-CM

## 2022-02-09 DIAGNOSIS — Z951 Presence of aortocoronary bypass graft: Secondary | ICD-10-CM | POA: Diagnosis not present

## 2022-02-09 DIAGNOSIS — R2689 Other abnormalities of gait and mobility: Secondary | ICD-10-CM | POA: Diagnosis not present

## 2022-02-09 DIAGNOSIS — E785 Hyperlipidemia, unspecified: Secondary | ICD-10-CM | POA: Diagnosis present

## 2022-02-09 DIAGNOSIS — N1832 Chronic kidney disease, stage 3b: Secondary | ICD-10-CM | POA: Diagnosis present

## 2022-02-09 DIAGNOSIS — K5903 Drug induced constipation: Secondary | ICD-10-CM | POA: Diagnosis not present

## 2022-02-09 DIAGNOSIS — E039 Hypothyroidism, unspecified: Secondary | ICD-10-CM | POA: Diagnosis present

## 2022-02-09 DIAGNOSIS — I5032 Chronic diastolic (congestive) heart failure: Secondary | ICD-10-CM | POA: Diagnosis not present

## 2022-02-09 DIAGNOSIS — Z79899 Other long term (current) drug therapy: Secondary | ICD-10-CM

## 2022-02-09 DIAGNOSIS — Y92002 Bathroom of unspecified non-institutional (private) residence single-family (private) house as the place of occurrence of the external cause: Secondary | ICD-10-CM

## 2022-02-09 DIAGNOSIS — F4329 Adjustment disorder with other symptoms: Secondary | ICD-10-CM

## 2022-02-09 DIAGNOSIS — Z888 Allergy status to other drugs, medicaments and biological substances status: Secondary | ICD-10-CM

## 2022-02-09 DIAGNOSIS — M6281 Muscle weakness (generalized): Secondary | ICD-10-CM | POA: Diagnosis not present

## 2022-02-09 DIAGNOSIS — W19XXXD Unspecified fall, subsequent encounter: Principal | ICD-10-CM

## 2022-02-09 DIAGNOSIS — S32591A Other specified fracture of right pubis, initial encounter for closed fracture: Secondary | ICD-10-CM

## 2022-02-09 DIAGNOSIS — I2510 Atherosclerotic heart disease of native coronary artery without angina pectoris: Secondary | ICD-10-CM | POA: Diagnosis present

## 2022-02-09 DIAGNOSIS — W19XXXA Unspecified fall, initial encounter: Secondary | ICD-10-CM | POA: Diagnosis not present

## 2022-02-09 DIAGNOSIS — R0902 Hypoxemia: Secondary | ICD-10-CM | POA: Diagnosis not present

## 2022-02-09 DIAGNOSIS — Z96651 Presence of right artificial knee joint: Secondary | ICD-10-CM | POA: Diagnosis present

## 2022-02-09 DIAGNOSIS — S2231XD Fracture of one rib, right side, subsequent encounter for fracture with routine healing: Secondary | ICD-10-CM | POA: Diagnosis not present

## 2022-02-09 DIAGNOSIS — Z8261 Family history of arthritis: Secondary | ICD-10-CM

## 2022-02-09 DIAGNOSIS — S32511A Fracture of superior rim of right pubis, initial encounter for closed fracture: Secondary | ICD-10-CM | POA: Diagnosis not present

## 2022-02-09 DIAGNOSIS — S32599A Other specified fracture of unspecified pubis, initial encounter for closed fracture: Secondary | ICD-10-CM | POA: Diagnosis present

## 2022-02-09 DIAGNOSIS — M808AXA Other osteoporosis with current pathological fracture, other site, initial encounter for fracture: Secondary | ICD-10-CM | POA: Diagnosis present

## 2022-02-09 DIAGNOSIS — S2231XA Fracture of one rib, right side, initial encounter for closed fracture: Secondary | ICD-10-CM

## 2022-02-09 DIAGNOSIS — Z7901 Long term (current) use of anticoagulants: Secondary | ICD-10-CM

## 2022-02-09 DIAGNOSIS — R2681 Unsteadiness on feet: Secondary | ICD-10-CM | POA: Diagnosis not present

## 2022-02-09 DIAGNOSIS — T50995A Adverse effect of other drugs, medicaments and biological substances, initial encounter: Secondary | ICD-10-CM | POA: Diagnosis not present

## 2022-02-09 LAB — CBC WITH DIFFERENTIAL/PLATELET
Abs Immature Granulocytes: 0.01 10*3/uL (ref 0.00–0.07)
Basophils Absolute: 0 10*3/uL (ref 0.0–0.1)
Basophils Relative: 0 %
Eosinophils Absolute: 0.1 10*3/uL (ref 0.0–0.5)
Eosinophils Relative: 3 %
HCT: 36.2 % (ref 36.0–46.0)
Hemoglobin: 11.8 g/dL — ABNORMAL LOW (ref 12.0–15.0)
Immature Granulocytes: 0 %
Lymphocytes Relative: 19 %
Lymphs Abs: 0.9 10*3/uL (ref 0.7–4.0)
MCH: 33 pg (ref 26.0–34.0)
MCHC: 32.6 g/dL (ref 30.0–36.0)
MCV: 101.1 fL — ABNORMAL HIGH (ref 80.0–100.0)
Monocytes Absolute: 0.5 10*3/uL (ref 0.1–1.0)
Monocytes Relative: 10 %
Neutro Abs: 3.1 10*3/uL (ref 1.7–7.7)
Neutrophils Relative %: 68 %
Platelets: 129 10*3/uL — ABNORMAL LOW (ref 150–400)
RBC: 3.58 MIL/uL — ABNORMAL LOW (ref 3.87–5.11)
RDW: 13.2 % (ref 11.5–15.5)
WBC: 4.5 10*3/uL (ref 4.0–10.5)
nRBC: 0 % (ref 0.0–0.2)

## 2022-02-09 MED ORDER — FENTANYL CITRATE PF 50 MCG/ML IJ SOSY
25.0000 ug | PREFILLED_SYRINGE | Freq: Once | INTRAMUSCULAR | Status: AC
  Administered 2022-02-10: 25 ug via INTRAVENOUS
  Filled 2022-02-09: qty 1

## 2022-02-09 MED ORDER — POLYETHYLENE GLYCOL 3350 17 G PO PACK: 17.0000 g | PACK | Freq: Every day | ORAL | Status: DC | PRN

## 2022-02-09 MED ORDER — POLYVINYL ALCOHOL 1.4 % OP SOLN
1.0000 [drp] | Freq: Four times a day (QID) | OPHTHALMIC | Status: DC
Start: 2022-02-10 — End: 2022-02-16
  Administered 2022-02-10 – 2022-02-16 (×24): 1 [drp] via OPHTHALMIC
  Filled 2022-02-09: qty 15

## 2022-02-09 MED ORDER — ROPINIROLE HCL 0.25 MG PO TABS
0.2500 mg | ORAL_TABLET | Freq: Three times a day (TID) | ORAL | Status: DC
  Administered 2022-02-10 – 2022-02-16 (×20): 0.25 mg via ORAL
  Filled 2022-02-09 (×21): qty 1

## 2022-02-09 MED ORDER — PANTOPRAZOLE SODIUM 40 MG PO TBEC
40.0000 mg | DELAYED_RELEASE_TABLET | Freq: Every day | ORAL | Status: DC
  Administered 2022-02-10 – 2022-02-16 (×7): 40 mg via ORAL
  Filled 2022-02-09 (×7): qty 1

## 2022-02-09 MED ORDER — OMEGA-3-ACID ETHYL ESTERS 1 G PO CAPS
1.0000 g | ORAL_CAPSULE | Freq: Every day | ORAL | Status: DC
  Administered 2022-02-10 – 2022-02-16 (×7): 1 g via ORAL
  Filled 2022-02-09 (×7): qty 1

## 2022-02-09 MED ORDER — VITAMIN D 25 MCG (1000 UNIT) PO TABS
1000.0000 [IU] | ORAL_TABLET | Freq: Every day | ORAL | Status: DC
  Administered 2022-02-10 – 2022-02-16 (×7): 1000 [IU] via ORAL
  Filled 2022-02-09 (×7): qty 1

## 2022-02-09 MED ORDER — GABAPENTIN 100 MG PO CAPS
100.0000 mg | ORAL_CAPSULE | Freq: Three times a day (TID) | ORAL | Status: DC
  Administered 2022-02-10 – 2022-02-16 (×20): 100 mg via ORAL
  Filled 2022-02-09 (×20): qty 1

## 2022-02-09 MED ORDER — LIDOCAINE 5 % EX PTCH
1.0000 | MEDICATED_PATCH | CUTANEOUS | Status: DC
  Administered 2022-02-10 – 2022-02-15 (×7): 1 via TRANSDERMAL
  Filled 2022-02-09 (×7): qty 1

## 2022-02-09 MED ORDER — TRAZODONE HCL 100 MG PO TABS
100.0000 mg | ORAL_TABLET | Freq: Every day | ORAL | Status: DC
  Administered 2022-02-10 – 2022-02-15 (×7): 100 mg via ORAL
  Filled 2022-02-09 (×7): qty 1

## 2022-02-09 MED ORDER — APIXABAN 2.5 MG PO TABS
2.5000 mg | ORAL_TABLET | Freq: Two times a day (BID) | ORAL | Status: DC
  Administered 2022-02-10 – 2022-02-16 (×14): 2.5 mg via ORAL
  Filled 2022-02-09 (×14): qty 1

## 2022-02-09 MED ORDER — LEVOTHYROXINE SODIUM 75 MCG PO TABS
75.0000 ug | ORAL_TABLET | Freq: Every day | ORAL | Status: DC
  Administered 2022-02-10 – 2022-02-16 (×7): 75 ug via ORAL
  Filled 2022-02-09 (×7): qty 1

## 2022-02-09 MED ORDER — CYCLOSPORINE 0.05 % OP EMUL
1.0000 [drp] | Freq: Two times a day (BID) | OPHTHALMIC | Status: DC
Start: 2022-02-09 — End: 2022-02-16
  Administered 2022-02-10 – 2022-02-16 (×14): 1 [drp] via OPHTHALMIC
  Filled 2022-02-09 (×15): qty 30

## 2022-02-09 MED ORDER — AMLODIPINE BESYLATE 5 MG PO TABS: 5.0000 mg | ORAL_TABLET | Freq: Every day | ORAL | Status: DC

## 2022-02-09 MED ORDER — FENTANYL CITRATE PF 50 MCG/ML IJ SOSY
25.0000 ug | PREFILLED_SYRINGE | INTRAMUSCULAR | Status: DC | PRN
  Administered 2022-02-09 – 2022-02-10 (×2): 25 ug via INTRAVENOUS
  Filled 2022-02-09 (×2): qty 1

## 2022-02-09 MED ORDER — HYDRALAZINE HCL 25 MG PO TABS
25.0000 mg | ORAL_TABLET | Freq: Three times a day (TID) | ORAL | Status: DC
  Administered 2022-02-10 – 2022-02-16 (×20): 25 mg via ORAL
  Filled 2022-02-09 (×20): qty 1

## 2022-02-09 MED ORDER — AMIODARONE HCL 200 MG PO TABS
200.0000 mg | ORAL_TABLET | Freq: Every day | ORAL | Status: DC
  Administered 2022-02-11 – 2022-02-16 (×6): 200 mg via ORAL
  Filled 2022-02-09 (×7): qty 1

## 2022-02-09 MED ORDER — ALPRAZOLAM 0.5 MG PO TABS
0.5000 mg | ORAL_TABLET | Freq: Every day | ORAL | Status: DC
  Administered 2022-02-10 – 2022-02-15 (×7): 0.5 mg via ORAL
  Filled 2022-02-09 (×7): qty 1

## 2022-02-09 MED ORDER — PRAVASTATIN SODIUM 40 MG PO TABS
40.0000 mg | ORAL_TABLET | Freq: Every evening | ORAL | Status: DC
  Administered 2022-02-10 – 2022-02-15 (×6): 40 mg via ORAL
  Filled 2022-02-09 (×6): qty 1

## 2022-02-09 MED ORDER — POLYETHYL GLYCOL-PROPYL GLYCOL 0.4-0.3 % OP SOLN: 1.0000 [drp] | Freq: Four times a day (QID) | OPHTHALMIC | Status: DC

## 2022-02-09 MED ORDER — ACETAMINOPHEN 325 MG PO TABS: 650.0000 mg | ORAL_TABLET | Freq: Four times a day (QID) | ORAL | Status: DC | PRN

## 2022-02-09 MED ORDER — HYDROCODONE-ACETAMINOPHEN 5-325 MG PO TABS
1.0000 | ORAL_TABLET | Freq: Four times a day (QID) | ORAL | Status: DC | PRN
  Administered 2022-02-09 – 2022-02-10 (×2): 1 via ORAL
  Filled 2022-02-09 (×2): qty 1

## 2022-02-09 NOTE — Assessment & Plan Note (Signed)
Continue levothyroxine 

## 2022-02-09 NOTE — Assessment & Plan Note (Signed)
Of the 10th rib. See on X-ray on 7/14. -continue pain management

## 2022-02-09 NOTE — Assessment & Plan Note (Signed)
Asymptomatic.  Continue statin and Eliquis

## 2022-02-09 NOTE — ED Triage Notes (Signed)
Pt. BIB [facility] from home. Pt. Had a fall this past Friday and was seen at [facility] d/t Pt. Being on blood thinners. At the visit to [facility] they found  she had a broken rib. Pt. Then was c/o hip pain and went to see her provider. The provider recommended the Pt. To go to the ED d/t a possible broken right hip. There are no signs of shortening or rotation. Pulses are equal and 2+ bilaterally.

## 2022-02-09 NOTE — Assessment & Plan Note (Signed)
Currently bradycardic in the 50s.  Monitor on continuous telemetry

## 2022-02-09 NOTE — ED Provider Notes (Signed)
[facility] [facility] DEPT Provider Note   CSN: [id number] Arrival date & time: 02/09/22  1647     History  Chief Complaint  Patient presents with   [place] is a 88 y.o. female.  Patient reports she fell in the bathroom 3 days ago and injured her right hip and fractured a rib on her right side.  Patient reports she is concerned that she has a broken hip.  Patient states she cannot walk she cannot bear weight on her right side.  Patient lives alone and she cannot get to the restroom.  She cannot care for herself patient has been taking Tylenol and tramadol without relief.  Patient's primary care physician Dr. Andria Frames advised her to return to the emergency department for further imaging.  The history is provided by the patient. No language interpreter was used.  Fall This is a new problem. Episode onset: 3 days. The problem occurs constantly. The problem has not changed since onset.Nothing aggravates the symptoms. Nothing relieves the symptoms. She has tried nothing for the symptoms.       Home Medications Prior to Admission medications   Medication Sig Start Date End Date Taking? Authorizing Provider  acetaminophen (TYLENOL) 500 MG tablet Take 1,000 mg by mouth every 6 (six) hours as needed for moderate pain or headache (pain/headache.).    [provider]  ALPRAZolam Duanne Moron) 0.5 MG tablet Take 1 tablet (0.5 mg total) by mouth at bedtime. 12/23/21   [name], MD  amiodarone (PACERONE) 200 MG tablet TAKE 1 TABLET BY MOUTH EVERY DAY Patient taking differently: Take 200 mg by mouth daily. 03/05/21   [name], MD  amLODipine (NORVASC) 5 MG tablet Take 1 tablet (5 mg total) by mouth daily. 02/06/22 02/01/23  Croitoru, Mihai, MD  apixaban (ELIQUIS) 2.5 MG TABS tablet Take 1 tablet (2.5 mg total) by mouth 2 (two) times daily. 06/17/21   Warren Lacy, PA-C  cholecalciferol (VITAMIN D3) 25 MCG (1000 UNIT) tablet Take 1,000  Units by mouth daily.    [provider]  cycloSPORINE (RESTASIS) 0.05 % ophthalmic emulsion Place 1 drop into both eyes 2 (two) times daily.    [provider]  gabapentin (NEURONTIN) 100 MG capsule TAKE 1 CAPSULE (100 MG TOTAL) BY MOUTH 3 (THREE) TIMES DAILY. 05/19/21   [name], MD  hydrALAZINE (APRESOLINE) 25 MG tablet Take 1 tablet (25 mg total) by mouth in the morning and at bedtime. 01/08/22   [name], MD  levothyroxine (SYNTHROID) 75 MCG tablet TAKE 1 TABLET BY MOUTH EVERY DAY Patient taking differently: Take 75 mcg by mouth daily before breakfast. 03/05/21   [name], MD  meclizine (ANTIVERT) 25 MG tablet Take 25 mg by mouth 3 (three) times daily as needed for dizziness.    [provider]  midodrine (PROAMATINE) 2.5 MG tablet TAKE 1 TABLET BY MOUTH IN MORNING AND 1 TAB 6 HOURS LATER, DO NOT LAY FLAT FOR 4 HOURS AFTER TAKING THE MEDICATION. DO NOT TAKE FOR A SYSTOLIC OF 856 OF ABOVE. 02/28/21   Croitoru, Mihai, MD  Multiple Vitamin (MULTIVITAMIN WITH MINERALS) TABS tablet Take 1 tablet by mouth daily.    [provider]  nitroGLYCERIN (NITROSTAT) 0.4 MG SL tablet Place 1 tablet (0.4 mg total) under the tongue every 5 (five) minutes as needed for chest pain. 12/03/21   Croitoru, Mihai, MD  Omega-3 Fatty Acids (FISH OIL) 1000 MG CAPS Take 1,000 mg  by mouth every evening.    [provider]  omeprazole (PRILOSEC) 20 MG capsule TAKE 1 CAPSULE BY MOUTH EVERY DAY 01/05/22   Hensel, Jamal Collin, MD  Polyethyl Glycol-Propyl Glycol (LUBRICANT EYE DROPS) 0.4-0.3 % SOLN Place 1 drop into both eyes 4 (four) times daily.    [provider]  polyethylene glycol powder ([employer] PURELAX) 17 GM/SCOOP powder MIX 17 G AND TAKE BY MOUTH DAILY. Patient taking differently: Take 17 g by mouth daily as needed for mild constipation. 07/03/21   [name], MD  pravastatin (PRAVACHOL) 40 MG tablet TAKE 1 TABLET BY MOUTH EVERY DAY IN THE  EVENING Patient taking differently: Take 40 mg by mouth every evening. 09/09/21   Croitoru, Mihai, MD  rOPINIRole (REQUIP) 0.25 MG tablet TAKE 1 TABLET BY MOUTH THREE TIMES A DAY Patient taking differently: Take 0.25 mg by mouth 3 (three) times daily. 05/06/21   [name], MD  traMADol (ULTRAM) 50 MG tablet Take 1 tablet (50 mg total) by mouth at bedtime. 01/19/22   [name], MD  traMADol (ULTRAM) 50 MG tablet Take 1 tablet (50 mg total) by mouth every 6 (six) hours as needed for up to 10 doses. 02/06/22   Luna Fuse, MD  traZODone (DESYREL) 100 MG tablet TAKE 1 TABLET BY MOUTH EVERYDAY AT BEDTIME Patient taking differently: Take 100 mg by mouth at bedtime. 09/10/21   [name], MD      Allergies    Atorvastatin, Beta adrenergic blockers, Crestor [rosuvastatin], Morphine and related, and Prednisone    Review of Systems   Review of Systems  Musculoskeletal:  Positive for arthralgias and gait problem.  All other systems reviewed and are negative.   Physical Exam Updated Vital Signs BP (!) 167/67   Pulse (!) 56   Temp 98 F (36.7 C) (Oral)   Resp 16   Ht '5\' 1"'$  (1.549 m)   Wt 52.6 kg   SpO2 94%   BMI 21.92 kg/m  Physical Exam Vitals and nursing note reviewed.  Constitutional:      Appearance: She is well-developed.  HENT:     Head: Normocephalic.  Cardiovascular:     Rate and Rhythm: Normal rate.  Pulmonary:     Effort: Pulmonary effort is normal.  Abdominal:     General: Abdomen is flat. There is no distension.  Musculoskeletal:        General: Tenderness present.     Cervical back: Normal range of motion.     Comments: Tender right hip, pain with range of motion, no shortening or rotation.  Skin:    General: Skin is warm.  Neurological:     Mental Status: She is alert and oriented to person, place, and time.  Psychiatric:        Mood and Affect: Mood normal.     ED Results / Procedures / Treatments   Labs (all labs ordered are listed,  but only abnormal results are displayed) Labs Reviewed  CBC WITH DIFFERENTIAL/PLATELET  BASIC METABOLIC PANEL    EKG None  Radiology DG Hip Unilat W or Wo Pelvis 2-3 Views Right  Result Date: 02/09/2022 CLINICAL DATA:  Fall EXAM: DG HIP (WITH OR WITHOUT PELVIS) 2-3V RIGHT COMPARISON:  CT 02/06/2022 FINDINGS: SI joints are non widened. Pubic symphysis is intact. Acute right inferior pubic ramus fracture. Femoral heads are normally position. Vascular calcifications IMPRESSION: Acute right inferior pubic ramus fracture Electronically Signed   By: Madie Reno.D.  On: 02/09/2022 19:30   DG Femur Min 2 Views Right  Result Date: 02/09/2022 CLINICAL DATA:  Fall EXAM: RIGHT FEMUR 2 VIEWS COMPARISON:  CT 02/06/2022 FINDINGS: Femoral head is normally positioned. No fracture or malalignment of the femur. Right knee replacement with normal alignment. Acute minimally displaced right inferior pubic ramus fracture. IMPRESSION: Acute minimally displaced right inferior pubic ramus fracture Electronically Signed   By: Donavan Foil M.D.   On: 02/09/2022 19:29    Procedures Procedures    Medications Ordered in ED Medications - No data to display  ED Course/ Medical Decision Making/ A&P                           Medical Decision Making Pt fell 3 days ago.  Pt unable to get around at home.  Pt unable to get to bathroom.   Pt unable to tolerate weight on right hip  Amount and/or Complexity of Data Reviewed Independent Historian:     Details: Pt's son is here with her External Data Reviewed: notes.    Details: primary care notes reviewed Labs: ordered. Decision-making details documented in ED Course. Radiology: ordered and independent interpretation performed. Decision-making details documented in ED Course.    Details: Right hip and femur xray shows pubic ramus fracture. Discussion of management or test interpretation with external provider(s): I spoke with Hospitalist for admission.             Final Clinical Impression(s) / ED Diagnoses Final diagnoses:  Fall, subsequent encounter  Closed fracture of ramus of right pubis with routine healing, subsequent encounter  Closed fracture of one rib of right side, initial encounter    Rx / DC Orders ED Discharge Orders     None         Sidney Ace 02/09/22 2053    Davonna Belling, MD 02/10/22 [phone]

## 2022-02-09 NOTE — Telephone Encounter (Signed)
Right pain.  Called patient.  Had mechanical fall 4 days ago.  In ER she mostly complained of rib pain and did indeed have a rib fracture.  While the rib hurts some, her current main complaint is severe right hip pain.  Unable to bear weight on the right leg.  Cannot ambulate at all.  No dedicated hip imaging was done with the fall.  (Did have CT abd and pelvis which did not show an obvious fracture.)  I am still concerned about the possibility of a hip fracture.  Advised to return to ER for further evaluation.

## 2022-02-09 NOTE — Telephone Encounter (Signed)
[facility]) calls because according to patients insurance she has custodial care after a hospital stay as a benefit.  They told him that the doctor would have to initiate it.  They are good until Wednesday, but that is when everyone goes back to work and they will need some help.  You can contact him @   [phone] - cell  [phone] - Home

## 2022-02-09 NOTE — H&P (Addendum)
History and Physical    Patient: Jacqueline [name] ZOX:[id number] DOB: 11-Sep-1930 DOA: 02/09/2022 DOS: the patient was seen and examined on 02/09/2022 PCP: [name], MD  Patient coming from: Home  Chief Complaint:  Chief Complaint  Patient presents with   Fall   HPI: Jacqueline [name] is a 88 y.o. female with medical history significant of tachybradycardia syndrome, paroxysmal atrial fibrillation on Eliquis, osteoporosis, chronic diastolic heart failure, hypothyroidism, CKD 3, CAD s/p CABG, and hyperlipidemia who presents with worsening right lower extremity pain and difficulty with ambulation following a recent fall.  Patient fell on 7/14 in her bathroom when she lost balance while trying to shave her legs.  She then presented to the ED and was found to have right 10th rib fracture and sent home with tramadol. Since then she has had increased pain to her right lower extremity.  Normally ambulates independently but now is having to use a walker.  In the ED today, x-ray of the right femur was negative but pelvic x-ray is showing acute right inferior pubic rami fracture.  Hospitalist then called management of pain overnight.  Review of Systems: As mentioned in the history of present illness. All other systems reviewed and are negative. Past Medical History:  Diagnosis Date   Arthritis    back   Atrial fibrillation with rapid ventricular response ([place]) 02/2014   a. CHA2DS2VASc = 6 -> on eliquis;  b. 02/2014 s/p DCCV;  c. 08/2014 Echo: EF 55-60%, Gr 2 DD, mild MR, triv AI.   CAD (coronary artery disease)    a. s/p CABG x 2 (LIMA->LAD, VG->Diag);  b. 08/2014 Cath: LM nl, LAD 90p, LCX nl, RCA nl/dominant, LIMA->LAD atretic, VG->D2 patent w/ retrograde filling of LAD, EF 55-60%-->Med Rx.   Diverticulitis 02/21/2012   GERD (gastroesophageal reflux disease)    Hyperlipemia    Hypertension    Insomnia    Past Surgical History:  Procedure Laterality Date   ABDOMINAL HYSTERECTOMY  1975    CARDIAC CATHETERIZATION N/A 05/09/2015   Procedure: Right Heart Cath;  Surgeon: Larey Dresser, MD;  Location: [facility];  Service: Cardiovascular;  Laterality: N/A;   CARDIOVERSION N/A 02/28/2014   Procedure: CARDIOVERSION;  Surgeon: Sanda Klein, MD;  Location: [facility];  Service: Cardiovascular;  Laterality: N/A;   CARDIOVERSION N/A 01/30/2019   Procedure: CARDIOVERSION;  Surgeon: Fay Records, MD;  Location: [facility];  Service: Cardiovascular;  Laterality: N/A;   CATARACT EXTRACTION Bilateral 2014   with lens implanted   COLONOSCOPY N/A 02/19/2015   Procedure: COLONOSCOPY;  Surgeon: Ladene Artist, MD;  Location: [facility];  Service: Endoscopy;  Laterality: N/A;   CORONARY ARTERY BYPASS GRAFT  2006   off-pump bypass surgery with LIMA to the LAD and SVG to the second diagonal artery ( performed by Dr Servando Snare)    [place] Bilateral    L-2004, R-2006   LEFT HEART CATHETERIZATION WITH CORONARY/GRAFT ANGIOGRAM N/A 09/17/2014   Procedure: LEFT HEART CATHETERIZATION WITH Beatrix Fetters;  Surgeon: Troy Sine, MD;  Location: [facility];  Service: Cardiovascular;  Laterality: N/A;   REIMPLANTATION OF TOTAL KNEE Right 10/08/2014   Procedure: REIMPLANTATION OF RIGHT TOTAL KNEE ARTHROPLASTY WITH REMOVAL OF ANTIBIOTIC SPACER;  Surgeon: Paralee Cancel, MD;  Location: [facility];  Service: Orthopedics;  Laterality: Right;   ROTATOR CUFF REPAIR Bilateral    r-1999, l- 2005   TEE WITHOUT CARDIOVERSION N/A 02/28/2014   Procedure: TRANSESOPHAGEAL ECHOCARDIOGRAM (TEE);  Surgeon: Sanda Klein, MD;  Location: [facility];  Service: Cardiovascular;  Laterality: N/A;   TEE WITHOUT CARDIOVERSION N/A 01/30/2019   Procedure: TRANSESOPHAGEAL ECHOCARDIOGRAM (TEE);  Surgeon: Fay Records, MD;  Location: [facility];  Service: Cardiovascular;  Laterality: N/A;   TOTAL KNEE ARTHROPLASTY Right 07/02/2014   Procedure: Resection of Infected Right Total Knee Arthroplasty with  placement antibiotic spacer;  Surgeon: Mauri Pole, MD;  Location: [facility];  Service: Orthopedics;  Laterality: Right;   Social History:  reports that she has never smoked. She has never used smokeless tobacco. She reports that she does not drink alcohol and does not use drugs.  Allergies  Allergen Reactions   Atorvastatin Other (See Comments)    Myalgias in legs   Beta Adrenergic Blockers Other (See Comments)    Severe bradycardia   Crestor [Rosuvastatin] Other (See Comments)    Myalgias in legs   Morphine And Related Other (See Comments)    "Crazy thoughts, severe headache, sick"   Prednisone     On two occasions has caused A fib    Family History  Problem Relation Age of Onset   Parkinson's disease Mother    Heart attack Brother    Arthritis Brother    Cancer Brother        Unknown   Breast cancer Neg Hx     Prior to Admission medications   Medication Sig Start Date End Date Taking? Authorizing Provider  acetaminophen (TYLENOL) 500 MG tablet Take 1,000 mg by mouth every 6 (six) hours as needed for moderate pain or headache (pain/headache.).   Yes [provider]  ALPRAZolam (XANAX) 0.5 MG tablet Take 1 tablet (0.5 mg total) by mouth at bedtime. 12/23/21  Yes Hensel, Jamal Collin, MD  amiodarone (PACERONE) 200 MG tablet TAKE 1 TABLET BY MOUTH EVERY DAY Patient taking differently: Take 200 mg by mouth daily. 03/05/21  Yes Hensel, Jamal Collin, MD  amLODipine (NORVASC) 5 MG tablet Take 1 tablet (5 mg total) by mouth daily. 02/06/22 02/01/23 Yes Croitoru, Mihai, MD  apixaban (ELIQUIS) 2.5 MG TABS tablet Take 1 tablet (2.5 mg total) by mouth 2 (two) times daily. 06/17/21  Yes Warren Lacy, PA-C  cholecalciferol (VITAMIN D3) 25 MCG (1000 UNIT) tablet Take 1,000 Units by mouth daily.   Yes [provider]  cycloSPORINE (RESTASIS) 0.05 % ophthalmic emulsion Place 1 drop into both eyes 2 (two) times daily.   Yes [provider]  gabapentin (NEURONTIN) 100 MG  capsule TAKE 1 CAPSULE (100 MG TOTAL) BY MOUTH 3 (THREE) TIMES DAILY. 05/19/21  Yes Hensel, Jamal Collin, MD  hydrALAZINE (APRESOLINE) 25 MG tablet Take 1 tablet (25 mg total) by mouth in the morning and at bedtime. Patient taking differently: Take 25 mg by mouth 3 (three) times daily. 01/08/22  Yes Hensel, Jamal Collin, MD  levothyroxine (SYNTHROID) 75 MCG tablet TAKE 1 TABLET BY MOUTH EVERY DAY Patient taking differently: Take 75 mcg by mouth daily before breakfast. 03/05/21  Yes Hensel, Jamal Collin, MD  midodrine (PROAMATINE) 2.5 MG tablet TAKE 1 TABLET BY MOUTH IN MORNING AND 1 TAB 6 HOURS LATER, DO NOT LAY FLAT FOR 4 HOURS AFTER TAKING THE MEDICATION. DO NOT TAKE FOR A SYSTOLIC OF 350 OF ABOVE. Patient taking differently: Take 2.5 mg by mouth daily as needed (low blood pressure). DO NOT LAY FLAT FOR 4 HOURS AFTER TAKING THE MEDICATION. DO NOT TAKE FOR A SYSTOLIC OF 093 OF ABOVE. 02/28/21  Yes Croitoru, Mihai, MD  Multiple Vitamin (MULTIVITAMIN WITH MINERALS) TABS  tablet Take 1 tablet by mouth daily.   Yes [provider]  nitroGLYCERIN (NITROSTAT) 0.4 MG SL tablet Place 1 tablet (0.4 mg total) under the tongue every 5 (five) minutes as needed for chest pain. 12/03/21  Yes Croitoru, Mihai, MD  Omega-3 Fatty Acids (FISH OIL) 1000 MG CAPS Take 1,000 mg by mouth every evening.   Yes [provider]  omeprazole (PRILOSEC) 20 MG capsule TAKE 1 CAPSULE BY MOUTH EVERY DAY 01/05/22  Yes Hensel, Jamal Collin, MD  Polyethyl Glycol-Propyl Glycol (LUBRICANT EYE DROPS) 0.4-0.3 % SOLN Place 1 drop into both eyes 4 (four) times daily.   Yes [provider]  polyethylene glycol powder ([employer] PURELAX) 17 GM/SCOOP powder MIX 17 G AND TAKE BY MOUTH DAILY. Patient taking differently: Take 17 g by mouth daily as needed for mild constipation. 07/03/21  Yes Hensel, Jamal Collin, MD  pravastatin (PRAVACHOL) 40 MG tablet TAKE 1 TABLET BY MOUTH EVERY DAY IN THE EVENING Patient taking differently: Take 40 mg by mouth  every evening. 09/09/21  Yes Croitoru, Mihai, MD  rOPINIRole (REQUIP) 0.25 MG tablet TAKE 1 TABLET BY MOUTH THREE TIMES A DAY Patient taking differently: Take 0.25 mg by mouth 3 (three) times daily. 05/06/21  Yes Hensel, Jamal Collin, MD  traMADol (ULTRAM) 50 MG tablet Take 1 tablet (50 mg total) by mouth every 6 (six) hours as needed for up to 10 doses. 02/06/22  Yes Luna Fuse, MD  traZODone (DESYREL) 100 MG tablet TAKE 1 TABLET BY MOUTH EVERYDAY AT BEDTIME Patient taking differently: Take 100 mg by mouth at bedtime. 09/10/21  Yes Hensel, Jamal Collin, MD  traMADol (ULTRAM) 50 MG tablet Take 1 tablet (50 mg total) by mouth at bedtime. Patient not taking: Reported on 02/09/2022 01/19/22   [name], MD    Physical Exam: Vitals:   02/09/22 2000 02/09/22 2100 02/09/22 2200 02/09/22 2300  BP: (!) 167/67 (!) 177/67 (!) 158/66 (!) 186/82  Pulse: (!) 56 (!) 58 (!) 53 63  Resp: '16 16 14 14  '$ Temp:  98.1 F (36.7 C)    TempSrc:  Oral    SpO2: 94% 95% 95% 97%  Weight:      Height:       Constitutional: NAD, calm, comfortable, elderly female appearing younger than her stated age laying flat in bed Eyes: lids and conjunctivae normal ENMT: Mucous membranes are moist.   Neck: normal, supple Respiratory: clear to auscultation bilaterally, no wheezing, no crackles. Normal respiratory effort.   Cardiovascular: Regular rate and rhythm, no murmurs / rubs / gallops. No extremity edema. .  Abdomen: Soft, nondistended, mild suprapubic tenderness  Bowel sounds positive.  Musculoskeletal: no clubbing / cyanosis. No joint deformity upper and lower extremities. Normal muscle tone.  Skin: no rashes, lesions, ulcers. Neurologic: CN 2-12 grossly intact. Sensation intact.  Only able to lift right lower extremity minimally above bed due to pain.  Psychiatric: Normal judgment and insight. Alert and oriented x 3. Normal mood. Data Reviewed:  See HPI  Assessment and Plan: * Inferior pubic ramus fracture  (HCC) Right inferior pubic rami fracture following a mechanical fall on 7/14 -PRN hydrocodone-acetaminophen for moderate pain, IV fentanyl for severe pain -Lidocaine patch -Weightbearing as tolerated -PT eval in the morning  Hx of CABG Asymptomatic.  Continue statin and Eliquis  Chronic diastolic CHF (congestive heart failure) (HCC) Euvolemic on exam.  Continue statin.  Tachy-brady syndrome (HCC) Currently bradycardic in the 50s.  Monitor on continuous telemetry  Paroxysmal atrial  fibrillation ([place]) Rate controlled.  Continue amiodarone. -Continue Eliquis  Hypothyroidism Continue levothyroxine  Hypertension Continue amlodipine, hydralazine  Right rib fracture Of the 10th rib. See on X-ray on 7/14. -continue pain management      Advance Care Planning:   Code Status: Full Code   Consults: None  Family Communication: Discussed with son at bedside  Severity of Illness: The appropriate patient status for this patient is OBSERVATION. Observation status is judged to be reasonable and necessary in order to provide the required intensity of service to ensure the patient's safety. The patient's presenting symptoms, physical exam findings, and initial radiographic and laboratory data in the context of their medical condition is felt to place them at decreased risk for further clinical deterioration. Furthermore, it is anticipated that the patient will be medically stable for discharge from the hospital within 2 midnights of admission.   Author: Orene Desanctis, DO 02/09/2022 11:53 PM  For on call review [email or web address].

## 2022-02-09 NOTE — Telephone Encounter (Signed)
Patients son is calling again regarding this. He says they need this in home help by Wednesday.

## 2022-02-09 NOTE — Assessment & Plan Note (Signed)
Rate controlled.  Continue amiodarone. -Continue Eliquis

## 2022-02-09 NOTE — Assessment & Plan Note (Signed)
Euvolemic on exam.  Continue statin.

## 2022-02-09 NOTE — Assessment & Plan Note (Addendum)
Right inferior pubic rami fracture following a mechanical fall on 7/14 -PRN hydrocodone-acetaminophen for moderate pain, IV fentanyl for severe pain -Lidocaine patch -Weightbearing as tolerated -PT eval in the morning

## 2022-02-09 NOTE — Telephone Encounter (Signed)
Pt states that Dr. Andria Frames was supposed to call her about her BP this am.  Message to Dr. Andria Frames. Christen Bame, CMA

## 2022-02-09 NOTE — Telephone Encounter (Signed)
Patient's son LVM on nurse line regarding patient's fall on Friday, 7/14.  Reports that patient is really struggling and in a lot of pain. Having difficulty with mobility and performing ADLs.   Reports that she also twisted her leg during fall. She is having difficulty bearing weight on this leg.   ED recommended that patient receive follow up at the beginning of this week. (We do not have any appointments until Thursday)  Son is requesting returned phone call from [place] to discuss further.   Please advise.   Son's phone number is [phone].  [name], RN

## 2022-02-09 NOTE — Assessment & Plan Note (Signed)
Continue amlodipine, hydralazine

## 2022-02-10 DIAGNOSIS — I5032 Chronic diastolic (congestive) heart failure: Secondary | ICD-10-CM | POA: Diagnosis present

## 2022-02-10 DIAGNOSIS — Z885 Allergy status to narcotic agent status: Secondary | ICD-10-CM | POA: Diagnosis not present

## 2022-02-10 DIAGNOSIS — I130 Hypertensive heart and chronic kidney disease with heart failure and stage 1 through stage 4 chronic kidney disease, or unspecified chronic kidney disease: Secondary | ICD-10-CM | POA: Diagnosis present

## 2022-02-10 DIAGNOSIS — S2239XA Fracture of one rib, unspecified side, initial encounter for closed fracture: Secondary | ICD-10-CM | POA: Diagnosis present

## 2022-02-10 DIAGNOSIS — J9811 Atelectasis: Secondary | ICD-10-CM | POA: Diagnosis present

## 2022-02-10 DIAGNOSIS — M80851A Other osteoporosis with current pathological fracture, right femur, initial encounter for fracture: Secondary | ICD-10-CM | POA: Diagnosis present

## 2022-02-10 DIAGNOSIS — I10 Essential (primary) hypertension: Secondary | ICD-10-CM | POA: Diagnosis not present

## 2022-02-10 DIAGNOSIS — I480 Paroxysmal atrial fibrillation: Secondary | ICD-10-CM | POA: Diagnosis present

## 2022-02-10 DIAGNOSIS — M808AXA Other osteoporosis with current pathological fracture, other site, initial encounter for fracture: Secondary | ICD-10-CM | POA: Diagnosis present

## 2022-02-10 DIAGNOSIS — E861 Hypovolemia: Secondary | ICD-10-CM | POA: Diagnosis present

## 2022-02-10 DIAGNOSIS — Z96651 Presence of right artificial knee joint: Secondary | ICD-10-CM | POA: Diagnosis present

## 2022-02-10 DIAGNOSIS — Y92239 Unspecified place in hospital as the place of occurrence of the external cause: Secondary | ICD-10-CM | POA: Diagnosis not present

## 2022-02-10 DIAGNOSIS — Y92002 Bathroom of unspecified non-institutional (private) residence single-family (private) house as the place of occurrence of the external cause: Secondary | ICD-10-CM | POA: Diagnosis not present

## 2022-02-10 DIAGNOSIS — S32591A Other specified fracture of right pubis, initial encounter for closed fracture: Secondary | ICD-10-CM | POA: Diagnosis not present

## 2022-02-10 DIAGNOSIS — D631 Anemia in chronic kidney disease: Secondary | ICD-10-CM | POA: Diagnosis present

## 2022-02-10 DIAGNOSIS — N1832 Chronic kidney disease, stage 3b: Secondary | ICD-10-CM | POA: Diagnosis present

## 2022-02-10 DIAGNOSIS — E785 Hyperlipidemia, unspecified: Secondary | ICD-10-CM | POA: Diagnosis present

## 2022-02-10 DIAGNOSIS — Z90710 Acquired absence of both cervix and uterus: Secondary | ICD-10-CM | POA: Diagnosis not present

## 2022-02-10 DIAGNOSIS — Z888 Allergy status to other drugs, medicaments and biological substances status: Secondary | ICD-10-CM | POA: Diagnosis not present

## 2022-02-10 DIAGNOSIS — E039 Hypothyroidism, unspecified: Secondary | ICD-10-CM | POA: Diagnosis present

## 2022-02-10 DIAGNOSIS — I495 Sick sinus syndrome: Secondary | ICD-10-CM | POA: Diagnosis present

## 2022-02-10 DIAGNOSIS — Z951 Presence of aortocoronary bypass graft: Secondary | ICD-10-CM | POA: Diagnosis not present

## 2022-02-10 DIAGNOSIS — Z820 Family history of epilepsy and other diseases of the nervous system: Secondary | ICD-10-CM | POA: Diagnosis not present

## 2022-02-10 DIAGNOSIS — Z79890 Hormone replacement therapy (postmenopausal): Secondary | ICD-10-CM | POA: Diagnosis not present

## 2022-02-10 DIAGNOSIS — K219 Gastro-esophageal reflux disease without esophagitis: Secondary | ICD-10-CM | POA: Diagnosis present

## 2022-02-10 DIAGNOSIS — I2510 Atherosclerotic heart disease of native coronary artery without angina pectoris: Secondary | ICD-10-CM | POA: Diagnosis present

## 2022-02-10 DIAGNOSIS — Z79899 Other long term (current) drug therapy: Secondary | ICD-10-CM | POA: Diagnosis not present

## 2022-02-10 DIAGNOSIS — E871 Hypo-osmolality and hyponatremia: Secondary | ICD-10-CM | POA: Diagnosis present

## 2022-02-10 DIAGNOSIS — Z7901 Long term (current) use of anticoagulants: Secondary | ICD-10-CM | POA: Diagnosis not present

## 2022-02-10 DIAGNOSIS — W1830XA Fall on same level, unspecified, initial encounter: Secondary | ICD-10-CM | POA: Diagnosis present

## 2022-02-10 MED ORDER — SENNOSIDES-DOCUSATE SODIUM 8.6-50 MG PO TABS
1.0000 | ORAL_TABLET | Freq: Two times a day (BID) | ORAL | Status: DC
  Administered 2022-02-10 – 2022-02-11 (×3): 1 via ORAL
  Filled 2022-02-10 (×3): qty 1

## 2022-02-10 MED ORDER — HYDROMORPHONE HCL 1 MG/ML IJ SOLN
0.5000 mg | INTRAMUSCULAR | Status: DC | PRN
  Administered 2022-02-10 – 2022-02-13 (×10): 0.5 mg via INTRAVENOUS
  Filled 2022-02-10 (×4): qty 0.5
  Filled 2022-02-10: qty 1
  Filled 2022-02-10 (×6): qty 0.5

## 2022-02-10 MED ORDER — POLYETHYLENE GLYCOL 3350 17 G PO PACK
17.0000 g | PACK | Freq: Two times a day (BID) | ORAL | Status: DC
  Administered 2022-02-10 – 2022-02-14 (×9): 17 g via ORAL
  Filled 2022-02-10 (×12): qty 1

## 2022-02-10 MED ORDER — ACETAMINOPHEN 500 MG PO TABS
500.0000 mg | ORAL_TABLET | Freq: Three times a day (TID) | ORAL | Status: DC
  Administered 2022-02-10 – 2022-02-16 (×19): 500 mg via ORAL
  Filled 2022-02-10 (×19): qty 1

## 2022-02-10 MED ORDER — HYDROCODONE-ACETAMINOPHEN 5-325 MG PO TABS
1.0000 | ORAL_TABLET | Freq: Four times a day (QID) | ORAL | Status: DC | PRN
  Administered 2022-02-10 – 2022-02-16 (×7): 1 via ORAL
  Filled 2022-02-10: qty 2
  Filled 2022-02-10 (×7): qty 1

## 2022-02-10 MED ORDER — IBUPROFEN 200 MG PO TABS
400.0000 mg | ORAL_TABLET | Freq: Four times a day (QID) | ORAL | Status: DC | PRN
Start: 2022-02-10 — End: 2022-02-10

## 2022-02-10 NOTE — Progress Notes (Signed)
PROGRESS NOTE    Jacqueline [name]  ATF:[id number] DOB: 02/12/1931 DOA: 02/09/2022 PCP: [name], MD   Brief Narrative: 88 year old with past medical history significant of tachybradycardia syndrome, paroxysmal A-fib Eliquis, osteoporosis, chronic diastolic heart failure, hypothyroidism, CKD stage III, CAD status post CABG hyperlipidemia who presents with worsening right lower extremity pain and difficulty with ambulation following a recent fall.  Patient fell in her bathroom on 7/14 and lost balance while trying to shave her legs.  She presented to the ED and she was found to have a right 10th rib fracture, x-ray also showed right inferior pubic [place] fracture.   Assessment & Plan:   Principal Problem:   Inferior pubic ramus fracture (HCC) Active Problems:   Hx of CABG   Paroxysmal atrial fibrillation (HCC)   Tachy-brady syndrome (HCC)   Chronic diastolic CHF (congestive heart failure) (HCC)   Hypertension   Hypothyroidism   Right rib fracture   1-Inferior pubic ramus fracture: Patient had Mechanical fall.  She is having a lot of pain. IV fentanyl/vicodin is not helping.  Plan to schedule tylenol.  Change fentanyl to IV dilaudid.  Change Vicodin to 2 tablet PRN for pain.  Pt/OT consult.   Ribs fracture: Change fentanyl to IV dilaudid.  Incentive spirometry   Chronic diastolic heart failure: Compensated.  History of CABG: Continue with hydralazine on a statins  Tachy-bradycardia syndrome: Monitor  Paroxysmal A-fib: Continue with Eliquis.  Hypothyroidism: Continue with Synthroid  Hypertension: Continue with hydralazine. Holding Norvasc systolic blood pressure 270--623.   Estimated body mass index is 21.92 kg/m as calculated from the following:   Height as of this encounter: '5\' 1"'$  (1.549 m).   Weight as of this encounter: 52.6 kg.   DVT prophylaxis: Eliquis Code Status: Patient wishes to be a full code Family Communication: Family at  bedside Disposition Plan:  Status is: Observation The patient will require care spanning > 2 midnights and should be moved to inpatient because: need pain management/.      Consultants:  none  Procedures:  None  Antimicrobials:    Subjective: She is having pelvis and rib pain, worse with movement. Fentanyl is not helping.    Objective: Vitals:   02/10/22 0446 02/10/22 0500 02/10/22 0600 02/10/22 0741  BP: 131/64 (!) 141/60 (!) 123/59 (!) 125/56  Pulse: (!) 52 (!) 53 (!) 52 (!) 54  Resp: '19 14 14 15  '$ Temp: 98.3 F (36.8 C)   98 F (36.7 C)  TempSrc: Oral   Oral  SpO2: 97% 97% 96% 98%  Weight:      Height:       No intake or output data in the 24 hours ending 02/10/22 0744 Filed Weights   02/09/22 1718  Weight: 52.6 kg    Examination:  General exam: Appears calm and comfortable  Respiratory system: Clear to auscultation. Respiratory effort normal. Cardiovascular system: S1 & S2 heard, RRR.  Gastrointestinal system: Abdomen is nondistended, soft and nontender. No organomegaly or masses felt. Normal bowel sounds heard. Central nervous system: Alert and oriented.  Extremities: Symmetric 5 x 5 power.    Data Reviewed: I have personally reviewed following labs and imaging studies  CBC: Recent Labs  Lab 02/09/22 2055  WBC 4.5  NEUTROABS 3.1  HGB 11.8*  HCT 36.2  MCV 101.1*  PLT 762*   Basic Metabolic Panel: No results for input(s): "NA", "K", "CL", "CO2", "GLUCOSE", "BUN", "CREATININE", "CALCIUM", "MG", "PHOS" in the last 168 hours. GFR: CrCl cannot be calculated (  Patient's most recent lab result is older than the maximum 21 days allowed.). Liver Function Tests: No results for input(s): "AST", "ALT", "ALKPHOS", "BILITOT", "PROT", "ALBUMIN" in the last 168 hours. No results for input(s): "LIPASE", "AMYLASE" in the last 168 hours. No results for input(s): "AMMONIA" in the last 168 hours. Coagulation Profile: No results for input(s): "INR", "PROTIME" in  the last 168 hours. Cardiac Enzymes: No results for input(s): "CKTOTAL", "CKMB", "CKMBINDEX", "TROPONINI" in the last 168 hours. BNP (last 3 results) No results for input(s): "PROBNP" in the last 8760 hours. HbA1C: No results for input(s): "HGBA1C" in the last 72 hours. CBG: No results for input(s): "GLUCAP" in the last 168 hours. Lipid Profile: No results for input(s): "CHOL", "HDL", "LDLCALC", "TRIG", "CHOLHDL", "LDLDIRECT" in the last 72 hours. Thyroid Function Tests: No results for input(s): "TSH", "T4TOTAL", "FREET4", "T3FREE", "THYROIDAB" in the last 72 hours. Anemia Panel: No results for input(s): "VITAMINB12", "FOLATE", "FERRITIN", "TIBC", "IRON", "RETICCTPCT" in the last 72 hours. Sepsis Labs: No results for input(s): "PROCALCITON", "LATICACIDVEN" in the last 168 hours.  No results found for this or any previous visit (from the past 240 hour(s)).       Radiology Studies: DG Hip Unilat W or Wo Pelvis 2-3 Views Right  Result Date: 02/09/2022 CLINICAL DATA:  Fall EXAM: DG HIP (WITH OR WITHOUT PELVIS) 2-3V RIGHT COMPARISON:  CT 02/06/2022 FINDINGS: SI joints are non widened. Pubic symphysis is intact. Acute right inferior pubic ramus fracture. Femoral heads are normally position. Vascular calcifications IMPRESSION: Acute right inferior pubic ramus fracture Electronically Signed   By: Donavan Foil M.D.   On: 02/09/2022 19:30   DG Femur Min 2 Views Right  Result Date: 02/09/2022 CLINICAL DATA:  Fall EXAM: RIGHT FEMUR 2 VIEWS COMPARISON:  CT 02/06/2022 FINDINGS: Femoral head is normally positioned. No fracture or malalignment of the femur. Right knee replacement with normal alignment. Acute minimally displaced right inferior pubic ramus fracture. IMPRESSION: Acute minimally displaced right inferior pubic ramus fracture Electronically Signed   By: Donavan Foil M.D.   On: 02/09/2022 19:29        Scheduled Meds:  ALPRAZolam  0.5 mg Oral QHS   amiodarone  200 mg Oral Daily    apixaban  2.5 mg Oral BID   cholecalciferol  1,000 Units Oral Daily   cycloSPORINE  1 drop Both Eyes BID   gabapentin  100 mg Oral TID   hydrALAZINE  25 mg Oral TID   levothyroxine  75 mcg Oral QAC breakfast   lidocaine  1 patch Transdermal Q24H   omega-3 acid ethyl esters  1 g Oral Daily   pantoprazole  40 mg Oral Daily   polyvinyl alcohol  1 drop Both Eyes QID   pravastatin  40 mg Oral QPM   rOPINIRole  0.25 mg Oral TID   traZODone  100 mg Oral QHS   Continuous Infusions:   LOS: 0 days    Time spent: 35 minutes.     Elmarie Shiley, MD [facility]   If 7PM-7AM, please contact night-coverage [email or web address]  02/10/2022, 7:44 AM

## 2022-02-10 NOTE — Evaluation (Signed)
Physical Therapy Evaluation Patient Details Name: Jacqueline [name] MRN: [id number] DOB: [birth year] Today's Date: 02/10/2022  History of Present Illness  Shirleymae Hauth is a 88 y.o. female with medical history significant of tachybradycardia syndrome, paroxysmal atrial fibrillation on Eliquis, osteoporosis, chronic diastolic heart failure, hypothyroidism, CKD 3, CAD s/p CABG, and hyperlipidemia who presents with worsening right lower extremity pain and difficulty with ambulation following a recent fall on 7/14. Patient sustained right 10th   rib fracture and right inferior pubic ramus fracture, WBAT allowed.  Clinical Impression  Patient reporting right rib fracture  pain  more than pubic rami to mobilize, Patient unable to stand at the bedside. Patient required extra time and moving slowly  with max support to sit up onto edge. Patient unable to attempt standing due to reports of 10/10 pain(had IV medication prior).    Patient lives alone and will benefit from SNF rehab prior to  return home. Patient was  independent PTA. Pt admitted with above diagnosis.  Pt currently with functional limitations due to the deficits listed below (see PT Problem List). Pt will benefit from skilled PT to increase their independence and safety with mobility to allow discharge to the venue listed below.        Recommendations for follow up therapy are one component of a multi-disciplinary discharge planning process, led by the attending physician.  Recommendations may be updated based on patient status, additional functional criteria and insurance authorization.  Follow Up Recommendations Skilled nursing-short term rehab (<3 hours/day) Can patient physically be transported by private vehicle: No    Assistance Recommended at Discharge Frequent or constant Supervision/Assistance  Patient can return home with the following  A lot of help with walking and/or transfers;A lot of help with  bathing/dressing/bathroom;Assistance with cooking/housework;Assist for transportation;Help with stairs or ramp for entrance    Equipment Recommendations None recommended by PT  Recommendations for Other Services       Functional Status Assessment Patient has had a recent decline in their functional status and demonstrates the ability to make significant improvements in function in a reasonable and predictable amount of time.     Precautions / Restrictions Precautions Precautions: Fall Restrictions Weight Bearing Restrictions: Yes RLE Weight Bearing: Weight bearing as tolerated      Mobility  Bed Mobility Overal bed mobility: Needs Assistance Bed Mobility: Supine to Sit, Sit to Supine     Supine to sit: Max assist Sit to supine: Max assist   General bed mobility comments: moving very slowly, assisted legs over bed edge, use of bed sheet to lift the trunk to sitting, Once sitting, gurds trunk supporting with UE's. Max assist for legs and trunk back to supine, repositioned for comfort.    Transfers                   General transfer comment: unable    Ambulation/Gait                  Stairs            Wheelchair Mobility    Modified Rankin (Stroke Patients Only)       Balance Overall balance assessment: Needs assistance, History of Falls Sitting-balance support: Bilateral upper extremity supported, Feet unsupported Sitting balance-Leahy Scale: Poor Sitting balance - Comments: guarded when sitting, use OF UE's  Pertinent Vitals/Pain Pain Assessment Pain Assessment: 0-10 Pain Score: 10-Worst pain ever Pain Location: right lower rib cagem medial pubic area on right Pain Descriptors / Indicators: Discomfort, Grimacing, Guarding, Moaning Pain Intervention(s): Limited activity within patient's tolerance, Repositioned, Monitored during session, Premedicated before session    Home Living  Family/patient expects to be discharged to:: Private residence Living Arrangements: Alone Available Help at Discharge: Family;Available PRN/intermittently Type of Home: House Home Access: Stairs to enter Entrance Stairs-Rails: None Entrance Stairs-Number of Steps: 3   Home Layout: One level Home Equipment: Cane - single point;Rollator (4 wheels)      Prior Function Prior Level of Function : Independent/Modified Independent             Mobility Comments: does not drive ADLs Comments: housekeeper for cleaning 2x/month, doesn't drive     Hand Dominance   Dominant Hand: Right    Extremity/Trunk Assessment   Upper Extremity Assessment Upper Extremity Assessment: RUE deficits/detail RUE Deficits / Details: limited elevation due to right rib pain to raise that arm    Lower Extremity Assessment Lower Extremity Assessment: RLE deficits/detail;LLE deficits/detail RLE Deficits / Details: able to actively flex  the hip and knee very slowly LLE Deficits / Details: able to flex and extend more freely    Cervical / Trunk Assessment Cervical / Trunk Assessment: Kyphotic  Communication   Communication: No difficulties  Cognition Arousal/Alertness: Awake/alert Behavior During Therapy: WFL for tasks assessed/performed Overall Cognitive Status: Within Functional Limits for tasks assessed                                          General Comments      Exercises     Assessment/Plan    PT Assessment Patient needs continued PT services  PT Problem List Decreased strength;Decreased mobility;Decreased range of motion;Decreased knowledge of precautions;Decreased activity tolerance;Decreased balance;Decreased knowledge of use of DME;Pain       PT Treatment Interventions DME instruction;Therapeutic activities;Gait training;Therapeutic exercise;Patient/family education;Functional mobility training    PT Goals (Current goals can be found in the Care Plan section)   Acute Rehab PT Goals Patient Stated Goal: to  move  without pain PT Goal Formulation: With patient Time For Goal Achievement: 02/24/22 Potential to Achieve Goals: Fair    Frequency Min 2X/week     Co-evaluation               AM-PAC PT "6 Clicks" Mobility  Outcome Measure Help needed turning from your back to your side while in a flat bed without using bedrails?: Total Help needed moving from lying on your back to sitting on the side of a flat bed without using bedrails?: Total Help needed moving to and from a bed to a chair (including a wheelchair)?: Total Help needed standing up from a chair using your arms (e.g., wheelchair or bedside chair)?: Total Help needed to walk in hospital room?: Total Help needed climbing 3-5 steps with a railing? : Total 6 Click Score: 6    End of Session   Activity Tolerance: Patient limited by pain Patient left: in bed;with call bell/phone within reach Nurse Communication: Mobility status PT Visit Diagnosis: Unsteadiness on feet (R26.81);Muscle weakness (generalized) (M62.81);Difficulty in walking, not elsewhere classified (R26.2);Pain Pain - Right/Left: Right    Time: 7846-9629 PT Time Calculation (min) (ACUTE ONLY): 30 min   Charges:   PT Evaluation $PT Eval Low Complexity: 1 Low  PT Treatments $Therapeutic Activity: 8-22 mins        [place] Office [phone] Weekend [phone]   Claretha Cooper 02/10/2022, 11:03 AM

## 2022-02-10 NOTE — ED Notes (Signed)
Pt states previous pain medication has not helped pain at all. Pt states pain 10/10 at this time. MD made aware.

## 2022-02-11 DIAGNOSIS — I480 Paroxysmal atrial fibrillation: Secondary | ICD-10-CM | POA: Diagnosis not present

## 2022-02-11 DIAGNOSIS — I5032 Chronic diastolic (congestive) heart failure: Secondary | ICD-10-CM | POA: Diagnosis not present

## 2022-02-11 DIAGNOSIS — S32591A Other specified fracture of right pubis, initial encounter for closed fracture: Secondary | ICD-10-CM | POA: Diagnosis not present

## 2022-02-11 LAB — CBC
HCT: 35.3 % — ABNORMAL LOW (ref 36.0–46.0)
Hemoglobin: 11.5 g/dL — ABNORMAL LOW (ref 12.0–15.0)
MCH: 32.6 pg (ref 26.0–34.0)
MCHC: 32.6 g/dL (ref 30.0–36.0)
MCV: 100 fL (ref 80.0–100.0)
Platelets: 161 10*3/uL (ref 150–400)
RBC: 3.53 MIL/uL — ABNORMAL LOW (ref 3.87–5.11)
RDW: 13.2 % (ref 11.5–15.5)
WBC: 5.4 10*3/uL (ref 4.0–10.5)
nRBC: 0 % (ref 0.0–0.2)

## 2022-02-11 LAB — BASIC METABOLIC PANEL
Anion gap: 7 (ref 5–15)
BUN: 24 mg/dL — ABNORMAL HIGH (ref 8–23)
CO2: 24 mmol/L (ref 22–32)
Calcium: 8.8 mg/dL — ABNORMAL LOW (ref 8.9–10.3)
Chloride: 100 mmol/L (ref 98–111)
Creatinine, Ser: 1.4 mg/dL — ABNORMAL HIGH (ref 0.44–1.00)
GFR, Estimated: 36 mL/min — ABNORMAL LOW (ref >60)
Glucose, Bld: 94 mg/dL (ref 70–99)
Potassium: 5.1 mmol/L (ref 3.5–5.1)
Sodium: 131 mmol/L — ABNORMAL LOW (ref 135–145)

## 2022-02-11 MED ORDER — SENNOSIDES-DOCUSATE SODIUM 8.6-50 MG PO TABS
2.0000 | ORAL_TABLET | Freq: Two times a day (BID) | ORAL | Status: DC
  Administered 2022-02-11 – 2022-02-16 (×8): 2 via ORAL
  Filled 2022-02-11 (×9): qty 2

## 2022-02-11 MED ORDER — HYDROCODONE-ACETAMINOPHEN 5-325 MG PO TABS
1.0000 | ORAL_TABLET | Freq: Three times a day (TID) | ORAL | Status: DC
  Administered 2022-02-11 (×2): 1 via ORAL
  Filled 2022-02-11 (×3): qty 1

## 2022-02-11 NOTE — Evaluation (Signed)
Occupational Therapy Evaluation Patient Details Name: Patsy Zaragoza MRN: [id number] DOB: 1930-09-17 Today's Date: 02/11/2022   History of Present Illness Noelly Lasseigne is a 88 y.o. female with medical history significant of tachybradycardia syndrome, paroxysmal atrial fibrillation on Eliquis, osteoporosis, chronic diastolic heart failure, hypothyroidism, CKD 3, CAD s/p CABG, and hyperlipidemia who presents with worsening right lower extremity pain and difficulty with ambulation following a recent fall on 7/14. Patient sustained right 1oth   rib fracture and right inferior pubic ramus fracture, WBAT alloowed.   Clinical Impression   Patient is a pleasant 88 year old female who was admitted for above. Patient was living at home alone independently prior level. Currently, patient reports being in 10/10 pain with all movements and significant pain at rest as well. Patient was able to stand with max A with noted inability to weight shift for transfers with patient returned to bed after incontinence episode. Patient was noted to have decreased functional activity tolerance, decreased endurance, decreased standing balance, decreased safety awareness,increased pain, and decreased knowledge of AD/AE impacting participation in ADLs. Patient would continue to benefit from skilled OT services at this time while admitted and after d/c to address noted deficits in order to improve overall safety and independence in ADLs.       Recommendations for follow up therapy are one component of a multi-disciplinary discharge planning process, led by the attending physician.  Recommendations may be updated based on patient status, additional functional criteria and insurance authorization.   Follow Up Recommendations  Skilled nursing-short term rehab (<3 hours/day)    Assistance Recommended at Discharge Frequent or constant Supervision/Assistance  Patient can return home with the following Two people to help with  walking and/or transfers;Help with stairs or ramp for entrance;Direct supervision/assist for medications management;Assist for transportation;Direct supervision/assist for financial management;Assistance with cooking/housework;A lot of help with bathing/dressing/bathroom    Functional Status Assessment  Patient has had a recent decline in their functional status and demonstrates the ability to make significant improvements in function in a reasonable and predictable amount of time.  Equipment Recommendations  Other (comment) (defer to next venue)    Recommendations for Other Services       Precautions / Restrictions Precautions Precautions: Fall Restrictions Weight Bearing Restrictions: Yes RLE Weight Bearing: Weight bearing as tolerated      Mobility Bed Mobility Overal bed mobility: Needs Assistance Bed Mobility: Supine to Sit, Sit to Supine     Supine to sit: Max assist Sit to supine: Max assist   General bed mobility comments: moving very slowly, assisted legs over bed edge, use of bed sheet to lift the trunk to sitting, Once sitting, good trunk supporting with UE's. Max assist for legs and trunk back to supine, repositioned for comfort.    Transfers                          Balance Overall balance assessment: Needs assistance, History of Falls Sitting-balance support: Feet unsupported, Single extremity supported Sitting balance-Leahy Scale: Fair     Standing balance support: Bilateral upper extremity supported, During functional activity, Reliant on assistive device for balance Standing balance-Leahy Scale: Poor                             ADL either performed or assessed with clinical judgement   ADL Overall ADL's : Needs assistance/impaired Eating/Feeding: Set up;Sitting   Grooming: Set up;Bed level  Upper Body Bathing: Bed level;Moderate assistance Upper Body Bathing Details (indicate cue type and reason): pain in ribs limiting  movement simulated tasks Lower Body Bathing: Moderate assistance;Sitting/lateral leans Lower Body Bathing Details (indicate cue type and reason): able to wipe tops of thighs after incontinence episde attempting to stand. Upper Body Dressing : Sitting;Moderate assistance   Lower Body Dressing: Total assistance;Bed level   Toilet Transfer: +2 for physical assistance;+2 for safety/equipment Toilet Transfer Details (indicate cue type and reason): patient was max A for sit to stanf with HOB raised with RW. patient attempted to take small step with inability to weight shift. Toileting- Clothing Manipulation and Hygiene: Total assistance;Bed level       Functional mobility during ADLs: +2 for physical assistance;+2 for safety/equipment       Vision Patient Visual Report: No change from baseline       Perception     Praxis      Pertinent Vitals/Pain Pain Assessment Pain Assessment: 0-10 Pain Score: 10-Worst pain ever Pain Location: right lower rib cage medial pubic area on right Pain Descriptors / Indicators: Discomfort, Grimacing, Guarding, Moaning Pain Intervention(s): Limited activity within patient's tolerance, Monitored during session, Premedicated before session, Repositioned     Hand Dominance Right   Extremity/Trunk Assessment Upper Extremity Assessment RUE Deficits / Details: MMT not tested with rib pain and pain with BUE movements   Lower Extremity Assessment Lower Extremity Assessment: Defer to PT evaluation   Cervical / Trunk Assessment Cervical / Trunk Assessment: Kyphotic   Communication Communication Communication: No difficulties   Cognition Arousal/Alertness: Awake/alert Behavior During Therapy: WFL for tasks assessed/performed Overall Cognitive Status: Within Functional Limits for tasks assessed                                 General Comments: patients son and daughter in law were in room as well     General Comments       Exercises      Shoulder Instructions      Home Living Family/patient expects to be discharged to:: Skilled nursing facility Living Arrangements: Alone Available Help at Discharge: Family;Available PRN/intermittently Type of Home: House Home Access: Stairs to enter [employer] of Steps: 3 Entrance Stairs-Rails: None Home Layout: One level     Bathroom Shower/Tub: Teacher, early years/pre: Handicapped height     Home Equipment: Cane - single point;Rollator (4 wheels)          Prior Functioning/Environment Prior Level of Function : Independent/Modified Independent             Mobility Comments: does not drive ADLs Comments: housekeeper for cleaning 2x/month, doesn't drive        OT Problem List: Decreased strength;Impaired balance (sitting and/or standing);Decreased safety awareness;Cardiopulmonary status limiting activity;Decreased knowledge of precautions;Decreased knowledge of use of DME or AE;Pain      OT Treatment/Interventions: Self-care/ADL training;Therapeutic exercise;Neuromuscular education;Energy conservation;DME and/or AE instruction;Therapeutic activities;Balance training;Patient/family education    OT Goals(Current goals can be found in the care plan section) Acute Rehab OT Goals Patient Stated Goal: to get pain under control OT Goal Formulation: With patient Time For Goal Achievement: 02/25/22 Potential to Achieve Goals: Fair  OT Frequency: Min 2X/week    Co-evaluation              AM-PAC OT "6 Clicks" Daily Activity     Outcome Measure Help from another person eating meals?: A Little Help from  another person taking care of personal grooming?: A Little Help from another person toileting, which includes using toliet, bedpan, or urinal?: Total Help from another person bathing (including washing, rinsing, drying)?: A Lot Help from another person to put on and taking off regular upper body clothing?: A Little Help from another person  to put on and taking off regular lower body clothing?: A Lot 6 Click Score: 14   End of Session Equipment Utilized During Treatment: Gait belt;Rolling walker (2 wheels) Nurse Communication: Mobility status;Patient requests pain meds  Activity Tolerance: Patient tolerated treatment well;Patient limited by pain Patient left: in bed;with call bell/phone within reach;with bed alarm set;with family/visitor present  OT Visit Diagnosis: Unsteadiness on feet (R26.81);Other abnormalities of gait and mobility (R26.89);Muscle weakness (generalized) (M62.81);Pain                Time: 8921-1941 OT Time Calculation (min): 27 min Charges:  OT Evaluation $OT Eval Moderate Complexity: 1 Mod OT Treatments $Self Care/Home Management : 8-22 mins  Jackelyn Poling OTR/L, MS Acute Rehabilitation Department Office# [phone] Pager# [phone]   Marcellina Millin 02/11/2022, 1:37 PM

## 2022-02-11 NOTE — Progress Notes (Addendum)
PROGRESS NOTE    Jacqueline [name]  GNF:[id number] DOB: 1930/12/18 DOA: 02/09/2022 PCP: [name], MD   Brief Narrative: 88 year old with past medical history significant of tachybradycardia syndrome, paroxysmal A-fib Eliquis, osteoporosis, chronic diastolic heart failure, hypothyroidism, CKD stage III, CAD status post CABG hyperlipidemia who presents with worsening right lower extremity pain and difficulty with ambulation following a recent fall.  Patient fell in her bathroom on 7/14 and lost balance while trying to shave her legs.  She presented to the ED and she was found to have a right 10th rib fracture, x-ray also showed right inferior pubic Ramey fracture.   Assessment & Plan:   Inferior pubic ramus fracture: Secondary to mechanical fall.  Patient was experiencing a lot of discomfort.  Pain medication regimen was adjusted yesterday.  Dose of Vicodin was increased.  Hydromorphone was added.  Will add scheduled Vicodin for a few days.  Try to minimize IV use is much as possible prior to discharge.  Pt/OT consult.   Ribs fracture: Continue pain medications.  Incentive spirometry.    Chronic diastolic heart failure: Compensated.  History of CABG: Stable.  Continue home medications including statin.    Tachy-bradycardia syndrome: Stable.  Monitor  Paroxysmal A-fib: Continue with Eliquis.  Noted to be on amiodarone as well.  Hypothyroidism: Continue with Synthroid  Essential hypertension: Continue with hydralazine.  Amlodipine on hold currently.  Normocytic anemia No significant drop in hemoglobin noted.  Continue to monitor.  Hyponatremia Monitor for now.  Possibly due to hypovolemia.  Chronic kidney disease stage 3b Renal function seems to be close to baseline.  Continue to monitor urine output.   DVT prophylaxis: Eliquis Code Status: Full code Family Communication: Discussed with patient Disposition Plan: Skilled nursing facility recommended by physical  therapy.  Status is: Inpatient Remains inpatient appropriate because: Uncontrolled pain      Consultants:  none  Procedures:  None  Antimicrobials:    Subjective: Patient mentions that pain is better after she was placed on intravenous hydromorphone.  Denies any chest pain.  No shortness of breath.  No nausea vomiting.   Objective: Vitals:   02/10/22 1530 02/10/22 2113 02/10/22 2337 02/11/22 0455  BP: 126/81 (!) 138/58 129/68 138/71  Pulse: (!) 57 (!) 51 71 62  Resp: '18 19 18 18  '$ Temp: 98.4 F (36.9 C) 98.3 F (36.8 C) 98.7 F (37.1 C) 98.8 F (37.1 C)  TempSrc: Oral Oral Oral Oral  SpO2: 93% 95% 98% 97%  Weight:      Height:        Intake/Output Summary (Last 24 hours) at 02/11/2022 0938 Last data filed at 02/10/2022 2300 Gross per 24 hour  Intake 240 ml  Output 750 ml  Net -510 ml   Filed Weights   02/09/22 1718  Weight: 52.6 kg    Examination:   General appearance: Awake alert.  In no distress Resp: Clear to auscultation bilaterally.  Normal effort Cardio: S1-S2 is normal regular.  No S3-S4.  No rubs murmurs or bruit GI: Abdomen is soft.  Nontender nondistended.  Bowel sounds are present normal.  No masses organomegaly Extremities: Limited mobility of the lower extremities noted. Neurologic: Alert and oriented x3.  No focal neurological deficits.      Data Reviewed: I have personally reviewed following labs and imaging studies  CBC: Recent Labs  Lab 02/09/22 2055 02/11/22 0804  WBC 4.5 5.4  NEUTROABS 3.1  --   HGB 11.8* 11.5*  HCT 36.2 35.3*  MCV  101.1* 100.0  PLT 129* 235    Basic Metabolic Panel: Recent Labs  Lab 02/11/22 0804  NA 131*  K 5.1  CL 100  CO2 24  GLUCOSE 94  BUN 24*  CREATININE 1.40*  CALCIUM 8.8*   GFR: Estimated Creatinine Clearance: 20.6 mL/min (A) (by C-G formula based on SCr of 1.4 mg/dL (H)).      Radiology Studies: DG Hip Unilat W or Wo Pelvis 2-3 Views Right  Result Date: 02/09/2022 CLINICAL  DATA:  Fall EXAM: DG HIP (WITH OR WITHOUT PELVIS) 2-3V RIGHT COMPARISON:  CT 02/06/2022 FINDINGS: SI joints are non widened. Pubic symphysis is intact. Acute right inferior pubic ramus fracture. Femoral heads are normally position. Vascular calcifications IMPRESSION: Acute right inferior pubic ramus fracture Electronically Signed   By: Donavan Foil M.D.   On: 02/09/2022 19:30   DG Femur Min 2 Views Right  Result Date: 02/09/2022 CLINICAL DATA:  Fall EXAM: RIGHT FEMUR 2 VIEWS COMPARISON:  CT 02/06/2022 FINDINGS: Femoral head is normally positioned. No fracture or malalignment of the femur. Right knee replacement with normal alignment. Acute minimally displaced right inferior pubic ramus fracture. IMPRESSION: Acute minimally displaced right inferior pubic ramus fracture Electronically Signed   By: Donavan Foil M.D.   On: 02/09/2022 19:29        Scheduled Meds:  acetaminophen  500 mg Oral TID   ALPRAZolam  0.5 mg Oral QHS   amiodarone  200 mg Oral Daily   apixaban  2.5 mg Oral BID   cholecalciferol  1,000 Units Oral Daily   cycloSPORINE  1 drop Both Eyes BID   gabapentin  100 mg Oral TID   hydrALAZINE  25 mg Oral TID   levothyroxine  75 mcg Oral QAC breakfast   lidocaine  1 patch Transdermal Q24H   omega-3 acid ethyl esters  1 g Oral Daily   pantoprazole  40 mg Oral Daily   polyethylene glycol  17 g Oral BID   polyvinyl alcohol  1 drop Both Eyes QID   pravastatin  40 mg Oral QPM   rOPINIRole  0.25 mg Oral TID   senna-docusate  1 tablet Oral BID   traZODone  100 mg Oral QHS   Continuous Infusions:   LOS: 1 day     Bonnielee Haff, MD [facility]   If 7PM-7AM, please contact night-coverage [email or web address]  02/11/2022, 9:38 AM

## 2022-02-12 DIAGNOSIS — S32591A Other specified fracture of right pubis, initial encounter for closed fracture: Secondary | ICD-10-CM | POA: Diagnosis not present

## 2022-02-12 DIAGNOSIS — I480 Paroxysmal atrial fibrillation: Secondary | ICD-10-CM | POA: Diagnosis not present

## 2022-02-12 DIAGNOSIS — I5032 Chronic diastolic (congestive) heart failure: Secondary | ICD-10-CM | POA: Diagnosis not present

## 2022-02-12 LAB — BASIC METABOLIC PANEL
Anion gap: 8 (ref 5–15)
BUN: 25 mg/dL — ABNORMAL HIGH (ref 8–23)
CO2: 25 mmol/L (ref 22–32)
Calcium: 9.3 mg/dL (ref 8.9–10.3)
Chloride: 104 mmol/L (ref 98–111)
Creatinine, Ser: 1.42 mg/dL — ABNORMAL HIGH (ref 0.44–1.00)
GFR, Estimated: 35 mL/min — ABNORMAL LOW (ref >60)
Glucose, Bld: 94 mg/dL (ref 70–99)
Potassium: 5 mmol/L (ref 3.5–5.1)
Sodium: 137 mmol/L (ref 135–145)

## 2022-02-12 MED ORDER — HYDROCODONE-ACETAMINOPHEN 7.5-325 MG PO TABS
1.0000 | ORAL_TABLET | Freq: Three times a day (TID) | ORAL | Status: DC
  Administered 2022-02-12 – 2022-02-14 (×7): 1 via ORAL
  Filled 2022-02-12 (×7): qty 1

## 2022-02-12 MED ORDER — BISACODYL 10 MG RE SUPP
10.0000 mg | Freq: Once | RECTAL | Status: AC
Start: 2022-02-12 — End: 2022-02-12
  Administered 2022-02-12: 10 mg via RECTAL
  Filled 2022-02-12: qty 1

## 2022-02-12 MED ORDER — FLEET ENEMA 7-19 GM/118ML RE ENEM
1.0000 | ENEMA | Freq: Every day | RECTAL | Status: DC | PRN
Start: 2022-02-12 — End: 2022-02-16

## 2022-02-12 NOTE — Progress Notes (Signed)
Physical Therapy Treatment Patient Details Name: Jacqueline [name] MRN: [id number] DOB: 1931/01/24 Today's Date: 02/12/2022   History of Present Illness Jacqueline [name] is a 88 y.o. female with medical history significant of tachybradycardia syndrome, paroxysmal atrial fibrillation on Eliquis, osteoporosis, chronic diastolic heart failure, hypothyroidism, CKD 3, CAD s/p CABG, and hyperlipidemia who presents with worsening right lower extremity pain and difficulty with ambulation following a recent fall on 7/14. Patient sustained right 1oth   rib fracture and right inferior pubic ramus fracture, WBAT alloowed.    PT Comments    Pleasant Lady AxO x 3 very willing to "try" but c/o a lot R flank pain (Rib Fx).  Assisted OOB to [facility] required increased time.  General bed mobility comments: Max Assist with increased time with use of bed pad to complete scooting and pivoting to EOB. General transfer comment: first assisted to from bed to [facility] required Max Asisst + 2 for safety due to instability and posterior lean.  75% VC's on proper hand placement and safety with turns. General Gait Details: limited distance of 5 feet + 2 assist with walker.  VERY unsteady gait.  HIGH FALL RISK. Pt will need ST Rehab at SNF to address mobility and functional decline prior to safely returning home.   Recommendations for follow up therapy are one component of a multi-disciplinary discharge planning process, led by the attending physician.  Recommendations may be updated based on patient status, additional functional criteria and insurance authorization.  Follow Up Recommendations  Skilled nursing-short term rehab (<3 hours/day) Can patient physically be transported by private vehicle: No   Assistance Recommended at Discharge Frequent or constant Supervision/Assistance  Patient can return home with the following A lot of help with walking and/or transfers;A lot of help with bathing/dressing/bathroom;Assistance with  cooking/housework;Assist for transportation;Help with stairs or ramp for entrance   Equipment Recommendations  None recommended by PT    Recommendations for Other Services       Precautions / Restrictions Precautions Precautions: Fall Restrictions Weight Bearing Restrictions: No RLE Weight Bearing: Weight bearing as tolerated     Mobility  Bed Mobility Overal bed mobility: Needs Assistance Bed Mobility: Supine to Sit     Supine to sit: Max assist     General bed mobility comments: Max Assist with increased time with use of bed pad to complete scooting and pivoting to EOB.    Transfers Overall transfer level: Needs assistance Equipment used: None, Rolling walker (2 wheels) Transfers: Sit to/from Stand, Bed to chair/wheelchair/BSC Sit to Stand: Mod assist, +2 physical assistance, +2 safety/equipment Stand pivot transfers: Mod assist, Max assist, +2 physical assistance, +2 safety/equipment         General transfer comment: first assisted to from bed to [facility] required Max Asisst + 2 for safety due to instability and posterior lean.  75% VC's on proper hand placement and safety with turns.    Ambulation/Gait Ambulation/Gait assistance: Max assist, +2 physical assistance, +2 safety/equipment Gait Distance (Feet): 5 Feet Assistive device: Rolling walker (2 wheels) Gait Pattern/deviations: Step-to pattern, Decreased stance time - right Gait velocity: decreased     General Gait Details: limited distance of 5 feet + 2 assist with walker.  VERY unsteady gait.  HIGH FALL RISK.   Stairs             Wheelchair Mobility    Modified Rankin (Stroke Patients Only)       Balance  Cognition Arousal/Alertness: Awake/alert Behavior During Therapy: WFL for tasks assessed/performed Overall Cognitive Status: Within Functional Limits for tasks assessed                                 General  Comments: AxO x 3 very pleasant and cooperative        Exercises      General Comments        Pertinent Vitals/Pain Pain Assessment Pain Assessment: Faces Faces Pain Scale: Hurts even more Pain Location: R flank rib Fx Pain Descriptors / Indicators: Discomfort, Grimacing, Guarding Pain Intervention(s): Monitored during session, Premedicated before session, Repositioned    Home Living                          Prior Function            PT Goals (current goals can now be found in the care plan section)      Frequency    Min 2X/week      PT Plan Current plan remains appropriate    Co-evaluation              AM-PAC PT "6 Clicks" Mobility   Outcome Measure  Help needed turning from your back to your side while in a flat bed without using bedrails?: A Lot Help needed moving from lying on your back to sitting on the side of a flat bed without using bedrails?: A Lot Help needed moving to and from a bed to a chair (including a wheelchair)?: A Lot Help needed standing up from a chair using your arms (e.g., wheelchair or bedside chair)?: A Lot Help needed to walk in hospital room?: A Lot Help needed climbing 3-5 steps with a railing? : Total 6 Click Score: 11    End of Session Equipment Utilized During Treatment: Gait belt Activity Tolerance: Patient limited by pain Patient left: in chair;with call bell/phone within reach;with bed alarm set Nurse Communication: Mobility status PT Visit Diagnosis: Unsteadiness on feet (R26.81);Muscle weakness (generalized) (M62.81);Difficulty in walking, not elsewhere classified (R26.2);Pain Pain - Right/Left: Right     Time: 1420-1445 PT Time Calculation (min) (ACUTE ONLY): 25 min  Charges:  $Gait Training: 8-22 mins $Therapeutic Activity: 8-22 mins                    Jacqueline [name]  PTA Acute  Rehabilitation Services Office M-F          [phone] Weekend pager [phone]

## 2022-02-12 NOTE — Progress Notes (Addendum)
PROGRESS NOTE    Jacqueline [name]  YTK:[id number] DOB: 01-14-1931 DOA: 02/09/2022 PCP: [name], MD   Brief Narrative: 88 year old with past medical history significant of tachybradycardia syndrome, paroxysmal A-fib Eliquis, osteoporosis, chronic diastolic heart failure, hypothyroidism, CKD stage III, CAD status post CABG hyperlipidemia who presents with worsening right lower extremity pain and difficulty with ambulation following a recent fall.  Patient fell in her bathroom on 7/14 and lost balance while trying to shave her legs.  She presented to the ED and she was found to have a right 10th rib fracture, x-ray also showed right inferior pubic [place] fracture.   Assessment & Plan:   Inferior pubic ramus fracture: Secondary to mechanical fall.  Patient experiencing a lot of pain and discomfort.  Pain medication doses were adjusted.  Scheduled Vicodin was also ordered.   Pain seems to be slightly better but still with a lot of discomfort even with slight movement.   We will continue the scheduled Vicodin for now and increase the dose to 7.5 mg.  Continue other as needed pain medications.  Seen by physical and occupational therapy.  Skilled nursing facility is recommended for rehab.  She does live by herself.     Ribs fracture: Continue pain medications.  Incentive spirometry.  Seems to be stable from a respiratory standpoint.  Constipation Likely due to pain, immobility and pain medications.  TSH was normal back in March.  She is on scheduled laxatives.  We will give her an enema today.  Chronic diastolic heart failure: Compensated.  History of CABG: Stable.  Continue home medications including statin.    Tachy-bradycardia syndrome: Stable.  Monitor  Paroxysmal A-fib: Continue with Eliquis.  Noted to be on amiodarone as well.  Hypothyroidism: Continue with Synthroid  Essential hypertension: Continue with hydralazine.  Amlodipine on hold currently.  Blood pressure  reasonably well controlled.  Normocytic anemia No significant drop in hemoglobin noted.  Continue to monitor.  Hyponatremia Monitor for now.  Possibly due to hypovolemia.  Improved.  Chronic kidney disease stage 3b Renal function seems to be close to baseline.  Continue to monitor urine output.   DVT prophylaxis: Eliquis Code Status: Full code Family Communication: Discussed with patient Disposition Plan: Skilled nursing facility recommended by physical therapy.  Status is: Inpatient Remains inpatient appropriate because: Uncontrolled pain      Consultants:  none  Procedures:  None  Antimicrobials:    Subjective: Pain is 6-7 out of 10 in intensity.  Denies any chest pain shortness of breath.  Still constipated.  Objective: Vitals:   02/11/22 0455 02/11/22 1248 02/11/22 2141 02/12/22 0422  BP: 138/71 138/60 (!) 147/56 124/61  Pulse: 62 (!) 59 60 (!) 53  Resp: '18 16 16 14  '$ Temp: 98.8 F (37.1 C) 98.5 F (36.9 C) 98.7 F (37.1 C) 98 F (36.7 C)  TempSrc: Oral Oral Oral Oral  SpO2: 97% 95% 96% 91%  Weight:      Height:        Intake/Output Summary (Last 24 hours) at 02/12/2022 0943 Last data filed at 02/12/2022 0925 Gross per 24 hour  Intake 580 ml  Output 875 ml  Net -295 ml    Filed Weights   02/09/22 1718  Weight: 52.6 kg    Examination:  General appearance: Awake alert.  In no distress Resp: Clear to auscultation bilaterally.  Normal effort Cardio: S1-S2 is normal regular.  No S3-S4.  No rubs murmurs or bruit GI: Abdomen is soft.  Nontender nondistended.  Bowel sounds are present normal.  No masses organomegaly Extremities: Slightly improved range of motion of the lower extremities noted today compared to yesterday. Neurologic: Alert and oriented x3.  No focal neurological deficits.       Data Reviewed: I have personally reviewed following labs and imaging studies  CBC: Recent Labs  Lab 02/09/22 2055 02/11/22 0804  WBC 4.5 5.4   NEUTROABS 3.1  --   HGB 11.8* 11.5*  HCT 36.2 35.3*  MCV 101.1* 100.0  PLT 129* 245    Basic Metabolic Panel: Recent Labs  Lab 02/11/22 0804 02/12/22 0528  NA 131* 137  K 5.1 5.0  CL 100 104  CO2 24 25  GLUCOSE 94 94  BUN 24* 25*  CREATININE 1.40* 1.42*  CALCIUM 8.8* 9.3    GFR: Estimated Creatinine Clearance: 20.3 mL/min (A) (by C-G formula based on SCr of 1.42 mg/dL (H)).      Radiology Studies: No results found.      Scheduled Meds:  acetaminophen  500 mg Oral TID   ALPRAZolam  0.5 mg Oral QHS   amiodarone  200 mg Oral Daily   apixaban  2.5 mg Oral BID   cholecalciferol  1,000 Units Oral Daily   cycloSPORINE  1 drop Both Eyes BID   gabapentin  100 mg Oral TID   hydrALAZINE  25 mg Oral TID   HYDROcodone-acetaminophen  1 tablet Oral TID   levothyroxine  75 mcg Oral QAC breakfast   lidocaine  1 patch Transdermal Q24H   omega-3 acid ethyl esters  1 g Oral Daily   pantoprazole  40 mg Oral Daily   polyethylene glycol  17 g Oral BID   polyvinyl alcohol  1 drop Both Eyes QID   pravastatin  40 mg Oral QPM   rOPINIRole  0.25 mg Oral TID   senna-docusate  2 tablet Oral BID   traZODone  100 mg Oral QHS   Continuous Infusions:   LOS: 2 days     Bonnielee Haff, MD [facility]   If 7PM-7AM, please contact night-coverage [email or web address]  02/12/2022, 9:43 AM

## 2022-02-12 NOTE — NC FL2 (Signed)
[place] LEVEL OF CARE SCREENING TOOL     IDENTIFICATION  Patient Name: Jacqueline [name] Birthdate: 1931/02/17 Sex: female Admission Date (Current Location): 02/09/2022  [facility] and [place] Number:  Herbalist and Address:  [facility],  [place], [place]      Provider Number: [id number]  Attending Physician Name and Address:  Bonnielee Haff, MD  Relative Name and Phone Number:   [facility] Goeller(son) [phone])    Current Level of Care: Hospital Recommended Level of Care: [place] Prior Approval Number:    Date Approved/Denied:   PASRR Number:  [id number])  Discharge Plan: SNF    Current Diagnoses: Patient Active Problem List   Diagnosis Date Noted   Rib fracture 02/10/2022   Inferior pubic ramus fracture ([place]) 02/09/2022   Right rib fracture 02/09/2022   Bronchitis 10/22/2021   Elevated troponin 10/22/2021   Abnormal ECG 10/22/2021   Polypharmacy 02/21/2021   Hypertensive kidney disease with CKD stage III ([place]) 10/17/2020   Tremor 12/13/2019   Weakness generalized 06/08/2019   Frequent falls 06/08/2019   Pes anserinus bursitis of right knee 03/23/2019   Vertigo 11/24/2018   On amiodarone therapy 07/08/2018   Adjustment reaction with physical symptoms 09/16/2017   Atherosclerosis of abdominal aorta ([place]) 07/22/2017   Hypothyroidism 07/22/2017   CAD (coronary artery disease)    Long term (current) use of anticoagulants 07/08/2017   Hereditary and idiopathic peripheral neuropathy 12/12/2015   Chronic diastolic CHF (congestive heart failure) ([place]) 06/05/2015   Pulmonary arterial hypertension ([place]) 05/17/2015   Tachy-brady syndrome ([place]) 05/06/2015   Hx of CABG 05/06/2015   Preventative health care 03/27/2015   Acute on chronic diastolic heart failure ([place])    S/P knee replacement 10/08/2014   Osteoporosis 03/22/2014   Paroxysmal atrial fibrillation ([place]) 02/27/2014   Hypertension     GERD (gastroesophageal reflux disease)    Dyslipidemia 09/23/2006   RHINITIS, ALLERGIC 09/23/2006   REFLUX ESOPHAGITIS 09/23/2006   DIVERTICULOSIS OF COLON 09/23/2006   BACK PAIN, LOW 09/23/2006   INSOMNIA NOS 09/23/2006    Orientation RESPIRATION BLADDER Height & Weight     Self, Time, Situation  Normal Continent Weight: 52.6 kg Height:  '5\' 1"'$  (154.9 cm)  BEHAVIORAL SYMPTOMS/MOOD NEUROLOGICAL BOWEL NUTRITION STATUS      Continent Diet (Regular)  AMBULATORY STATUS COMMUNICATION OF NEEDS Skin   Limited Assist Verbally Normal                       Personal Care Assistance Level of Assistance  Bathing, Feeding, Dressing Bathing Assistance: Limited assistance Feeding assistance: Limited assistance Dressing Assistance: Limited assistance     Functional Limitations Info  Sight, Hearing, Speech Sight Info: Impaired (eyeglasses) Hearing Info: Adequate Speech Info: Impaired (Dentures-top/bottom)    SPECIAL CARE FACTORS FREQUENCY  PT (By licensed PT), OT (By licensed OT)     PT Frequency:  (5x week) OT Frequency:  (5x week)            Contractures Contractures Info: Not present    Additional Factors Info  Code Status, Allergies Code Status Info:  (Full) Allergies Info:  (Atorvastatin, Beta Adrenergic Blockers, Crestor (Rosuvastatin), Morphine And Related, Prednisone)           Current Medications (02/12/2022):  This is the current hospital active medication list Current Facility-Administered Medications  Medication Dose Route Frequency Provider Last Rate Last Admin   acetaminophen (TYLENOL) tablet 500 mg  500  mg Oral TID Regalado, Belkys A, MD   500 mg at 02/12/22 1018   acetaminophen (TYLENOL) tablet 650 mg  650 mg Oral Q6H PRN Tu, Ching T, DO       ALPRAZolam Duanne Moron) tablet 0.5 mg  0.5 mg Oral QHS Tu, Ching T, DO   0.5 mg at 02/11/22 2200   amiodarone (PACERONE) tablet 200 mg  200 mg Oral Daily Tu, Ching T, DO   200 mg at 02/12/22 1019   apixaban (ELIQUIS)  tablet 2.5 mg  2.5 mg Oral BID Tu, Ching T, DO   2.5 mg at 02/12/22 1019   cholecalciferol (VITAMIN D3) tablet 1,000 Units  1,000 Units Oral Daily Tu, Ching T, DO   1,000 Units at 02/12/22 1019   cycloSPORINE (RESTASIS) 0.05 % ophthalmic emulsion 1 drop  1 drop Both Eyes BID Tu, Ching T, DO   1 drop at 02/12/22 1048   gabapentin (NEURONTIN) capsule 100 mg  100 mg Oral TID Tu, Ching T, DO   100 mg at 02/12/22 1018   hydrALAZINE (APRESOLINE) tablet 25 mg  25 mg Oral TID Tu, Ching T, DO   25 mg at 02/12/22 1018   HYDROcodone-acetaminophen (NORCO) 7.5-325 MG per tablet 1 tablet  1 tablet Oral TID Bonnielee Haff, MD   1 tablet at 02/12/22 1018   HYDROcodone-acetaminophen (NORCO/VICODIN) 5-325 MG per tablet 1-2 tablet  1-2 tablet Oral Q6H PRN Regalado, Belkys A, MD   1 tablet at 02/12/22 0610   HYDROmorphone (DILAUDID) injection 0.5 mg  0.5 mg Intravenous Q3H PRN Regalado, Belkys A, MD   0.5 mg at 02/12/22 1344   levothyroxine (SYNTHROID) tablet 75 mcg  75 mcg Oral QAC breakfast Tu, Ching T, DO   75 mcg at 02/12/22 0532   lidocaine (LIDODERM) 5 % 1 patch  1 patch Transdermal Q24H Tu, Ching T, DO   1 patch at 02/11/22 2340   omega-3 acid ethyl esters (LOVAZA) capsule 1 g  1 g Oral Daily Tu, Ching T, DO   1 g at 02/12/22 1018   pantoprazole (PROTONIX) EC tablet 40 mg  40 mg Oral Daily Tu, Ching T, DO   40 mg at 02/12/22 1021   polyethylene glycol (MIRALAX / GLYCOLAX) packet 17 g  17 g Oral Daily PRN Tu, Ching T, DO       polyethylene glycol (MIRALAX / GLYCOLAX) packet 17 g  17 g Oral BID Regalado, Belkys A, MD   17 g at 02/12/22 1019   polyvinyl alcohol (LIQUIFILM TEARS) 1.4 % ophthalmic solution 1 drop  1 drop Both Eyes QID Tu, Ching T, DO   1 drop at 02/12/22 1344   pravastatin (PRAVACHOL) tablet 40 mg  40 mg Oral QPM Tu, Ching T, DO   40 mg at 02/11/22 1705   rOPINIRole (REQUIP) tablet 0.25 mg  0.25 mg Oral TID Tu, Ching T, DO   0.25 mg at 02/12/22 1018   senna-docusate (Senokot-S) tablet 2 tablet  2  tablet Oral BID Bonnielee Haff, MD   2 tablet at 02/12/22 1019   sodium phosphate (FLEET) 7-19 GM/118ML enema 1 enema  1 enema Rectal Daily PRN Bonnielee Haff, MD       traZODone (DESYREL) tablet 100 mg  100 mg Oral QHS Tu, Ching T, DO   100 mg at 02/11/22 2200     Discharge Medications: Please see discharge summary for a list of discharge medications.  Relevant Imaging Results:  Relevant Lab Results:   Additional Information  [phone]  [id number]; Sport and exercise psychologist;Booster x1)  Marlaina Coburn, Juliann Pulse, RN

## 2022-02-12 NOTE — TOC Initial Note (Signed)
Transition of Care [facility]) - Initial/Assessment Note    Patient Details  Name: Jacqueline [name] MRN: [id number] Date of Birth: 1930/09/11  [facility] of [facility]) CM/SW Contact:    Dessa Phi, RN Phone Number: 02/12/2022, 2:33 PM  Clinical Narrative: PT recc SNF;patient/family agree to [facility] SNF-faxed out await bed offers prior HTA auth.                  Expected Discharge Plan: Skilled Nursing Facility Barriers to Discharge: Continued Medical Work up   Patient Goals and CMS Choice Patient states their goals for this hospitalization and ongoing recovery are:: Rehab CMS Medicare.gov Compare Post Acute Care list provided to:: Patient Choice offered to / list presented to : Patient, Adult Children  Expected Discharge Plan and Services Expected Discharge Plan: [place]   Discharge Planning Services: CM Consult Post Acute Care Choice: [place] Living arrangements for the past 2 months: Single Family Home                                      Prior Living Arrangements/Services Living arrangements for the past 2 months: Single Family Home Lives with:: Self Patient language and need for interpreter reviewed:: Yes Do you feel safe going back to the place where you live?: Yes      Need for Family Participation in Patient Care: Yes (Comment) Care giver support system in place?: Yes (comment)   Criminal Activity/Legal Involvement Pertinent to Current Situation/Hospitalization: No - Comment as needed  Activities of Daily Living Home Assistive Devices/Equipment: Cane (specify quad or straight), Walker (specify type), Shower chair without back (rolater) ADL Screening (condition at time of admission) Patient's cognitive ability adequate to safely complete daily activities?: Yes Is the patient deaf or have difficulty hearing?: No Does the patient have difficulty seeing, even when wearing glasses/contacts?: No Does the patient have difficulty  concentrating, remembering, or making decisions?: No Patient able to express need for assistance with ADLs?: Yes Does the patient have difficulty dressing or bathing?: No Independently performs ADLs?: Yes (appropriate for developmental age) Does the patient have difficulty walking or climbing stairs?: No Weakness of Legs: None Weakness of Arms/Hands: None  Permission Sought/Granted Permission sought to share information with : Case Manager Permission granted to share information with : Yes, Verbal Permission Granted  Share Information with NAME: Case Manager           Emotional Assessment Appearance:: Appears stated age Attitude/Demeanor/Rapport: Gracious Affect (typically observed): Accepting Orientation: : Oriented to Self, Oriented to Place, Oriented to  Time, Oriented to Situation Alcohol / Substance Use: Not Applicable Psych Involvement: No (comment)  Admission diagnosis:  Rib fracture [S22.39XA] Inferior pubic ramus fracture ([place]) [S32.599A] Fall, subsequent encounter [W19.XXXD] Closed fracture of one rib of right side, initial encounter [S22.31XA] Closed fracture of ramus of right pubis with routine healing, subsequent encounter [S32.591D] Patient Active Problem List   Diagnosis Date Noted   Rib fracture 02/10/2022   Inferior pubic ramus fracture ([place]) 02/09/2022   Right rib fracture 02/09/2022   Bronchitis 10/22/2021   Elevated troponin 10/22/2021   Abnormal ECG 10/22/2021   Polypharmacy 02/21/2021   Hypertensive kidney disease with CKD stage III ([place]) 10/17/2020   Tremor 12/13/2019   Weakness generalized 06/08/2019   Frequent falls 06/08/2019   Pes anserinus bursitis of right knee 03/23/2019   Vertigo 11/24/2018   On amiodarone therapy 07/08/2018   Adjustment  reaction with physical symptoms 09/16/2017   Atherosclerosis of abdominal aorta ([place]) 07/22/2017   Hypothyroidism 07/22/2017   CAD (coronary artery disease)    Long term (current) use of anticoagulants  07/08/2017   Hereditary and idiopathic peripheral neuropathy 12/12/2015   Chronic diastolic CHF (congestive heart failure) ([place]) 06/05/2015   Pulmonary arterial hypertension ([place]) 05/17/2015   Tachy-brady syndrome ([place]) 05/06/2015   Hx of CABG 05/06/2015   Preventative health care 03/27/2015   Acute on chronic diastolic heart failure ([place])    S/P knee replacement 10/08/2014   Osteoporosis 03/22/2014   Paroxysmal atrial fibrillation ([place]) 02/27/2014   Hypertension    GERD (gastroesophageal reflux disease)    Dyslipidemia 09/23/2006   RHINITIS, ALLERGIC 09/23/2006   REFLUX ESOPHAGITIS 09/23/2006   DIVERTICULOSIS OF COLON 09/23/2006   BACK PAIN, LOW 09/23/2006   INSOMNIA NOS 09/23/2006   PCP:  [name], MD Pharmacy:   [id number] [facility] [id number] [id number]: [phone]: [phone] [place] [address] [address]Phone: [phone]: [phone] [facility] [address]Phone: [phone]: [address]Phone: [phone]: [phone]    Social Determinants of Health (SDOH) Interventions    Readmission Risk Interventions     No data to display

## 2022-02-13 DIAGNOSIS — I480 Paroxysmal atrial fibrillation: Secondary | ICD-10-CM | POA: Diagnosis not present

## 2022-02-13 DIAGNOSIS — S32591A Other specified fracture of right pubis, initial encounter for closed fracture: Secondary | ICD-10-CM | POA: Diagnosis not present

## 2022-02-13 DIAGNOSIS — I5032 Chronic diastolic (congestive) heart failure: Secondary | ICD-10-CM | POA: Diagnosis not present

## 2022-02-13 LAB — BASIC METABOLIC PANEL
Anion gap: 8 (ref 5–15)
BUN: 26 mg/dL — ABNORMAL HIGH (ref 8–23)
CO2: 26 mmol/L (ref 22–32)
Calcium: 9.4 mg/dL (ref 8.9–10.3)
Chloride: 102 mmol/L (ref 98–111)
Creatinine, Ser: 1.49 mg/dL — ABNORMAL HIGH (ref 0.44–1.00)
GFR, Estimated: 33 mL/min — ABNORMAL LOW (ref >60)
Glucose, Bld: 94 mg/dL (ref 70–99)
Potassium: 4.5 mmol/L (ref 3.5–5.1)
Sodium: 136 mmol/L (ref 135–145)

## 2022-02-13 LAB — CBC
HCT: 33.4 % — ABNORMAL LOW (ref 36.0–46.0)
Hemoglobin: 10.8 g/dL — ABNORMAL LOW (ref 12.0–15.0)
MCH: 32.3 pg (ref 26.0–34.0)
MCHC: 32.3 g/dL (ref 30.0–36.0)
MCV: 100 fL (ref 80.0–100.0)
Platelets: 181 10*3/uL (ref 150–400)
RBC: 3.34 MIL/uL — ABNORMAL LOW (ref 3.87–5.11)
RDW: 13 % (ref 11.5–15.5)
WBC: 4.4 10*3/uL (ref 4.0–10.5)
nRBC: 0 % (ref 0.0–0.2)

## 2022-02-13 MED ORDER — LIDOCAINE 5 % EX PTCH: 1.0000 | MEDICATED_PATCH | CUTANEOUS | 0 refills | Status: DC

## 2022-02-13 MED ORDER — ALPRAZOLAM 0.5 MG PO TABS: 0.5000 mg | ORAL_TABLET | Freq: Every day | ORAL | 0 refills | Status: DC

## 2022-02-13 MED ORDER — POLYETHYLENE GLYCOL 3350 17 G PO PACK: 17.0000 g | PACK | Freq: Two times a day (BID) | ORAL | 0 refills | Status: AC

## 2022-02-13 MED ORDER — HYDROCODONE-ACETAMINOPHEN 7.5-325 MG PO TABS: ORAL_TABLET | ORAL | 0 refills | Status: DC

## 2022-02-13 MED ORDER — HYDROCODONE-ACETAMINOPHEN 5-325 MG PO TABS: 1.0000 | ORAL_TABLET | Freq: Four times a day (QID) | ORAL | 0 refills | Status: DC | PRN

## 2022-02-13 MED ORDER — HYDROMORPHONE HCL 1 MG/ML IJ SOLN
0.5000 mg | INTRAMUSCULAR | Status: DC | PRN
  Administered 2022-02-13 (×3): 0.5 mg via INTRAVENOUS
  Filled 2022-02-13 (×3): qty 0.5

## 2022-02-13 MED ORDER — SENNOSIDES-DOCUSATE SODIUM 8.6-50 MG PO TABS: 2.0000 | ORAL_TABLET | Freq: Two times a day (BID) | ORAL | Status: DC

## 2022-02-13 NOTE — Progress Notes (Signed)
PROGRESS NOTE    Jacqueline [name]  ZOX:[id number] DOB: [birth year] DOA: 02/09/2022 PCP: [name], MD   Brief Narrative: 88 year old with past medical history significant of tachybradycardia syndrome, paroxysmal A-fib Eliquis, osteoporosis, chronic diastolic heart failure, hypothyroidism, CKD stage III, CAD status post CABG hyperlipidemia who presents with worsening right lower extremity pain and difficulty with ambulation following a recent fall.  Patient fell in her bathroom on 7/14 and lost balance while trying to shave her legs.  She presented to the ED and she was found to have a right 10th rib fracture, x-ray also showed right inferior pubic Ramey fracture.   Assessment & Plan:   Inferior pubic ramus fracture: Secondary to mechanical fall.  Patient was experiencing a lot of pain and discomfort.  Pain medication doses were adjusted.  Scheduled Vicodin was also ordered.   Pain seems to have improved.  Her mobility is improving as well.  Seen by physical therapy.  Skilled nursing facility is recommended for rehabilitation.  Ribs fracture: Continue pain medications.  Incentive spirometry.  Seems to be stable from a respiratory standpoint.  Constipation Likely due to pain, immobility and pain medications.  TSH was normal back in March.  She is on scheduled laxatives.  Had multiple bowel movements yesterday.  Continue laxatives.  Chronic diastolic heart failure: Compensated.  History of CABG: Stable.  Continue home medications including statin.    Tachy-bradycardia syndrome: Stable.  Monitor  Paroxysmal A-fib: Continue with Eliquis.  Noted to be on amiodarone as well.  Hypothyroidism: Continue with Synthroid  Essential hypertension: Continue with hydralazine.  Amlodipine on hold currently.  Blood pressure reasonably well controlled.  Normocytic anemia No significant drop in hemoglobin noted.  Continue to monitor.  Hyponatremia Monitor for now.  Possibly due to  hypovolemia.  Improved.  Chronic kidney disease stage 3b Renal function seems to be close to baseline.  Continue to monitor urine output.   DVT prophylaxis: Eliquis Code Status: Full code Family Communication: Discussed with patient Disposition Plan: Skilled nursing facility recommended by physical therapy.  Status is: Inpatient Remains inpatient appropriate because: Uncontrolled pain      Consultants:  none  Procedures:  None  Antimicrobials:    Subjective: Pain is improving.  Improved mobility noted by patient.  Denies any shortness of breath or chest pain.  Objective: Vitals:   02/12/22 2037 02/13/22 0004 02/13/22 0451 02/13/22 0937  BP: (!) 123/57 (!) 119/51 (!) 125/56 (!) 138/54  Pulse: (!) 59 (!) 53 (!) 56 62  Resp: '18 18 18   '$ Temp: 98.3 F (36.8 C)  98.6 F (37 C)   TempSrc: Oral  Oral   SpO2: (!) 87% 94% 94%   Weight:      Height:        Intake/Output Summary (Last 24 hours) at 02/13/2022 1120 Last data filed at 02/13/2022 0616 Gross per 24 hour  Intake --  Output 501 ml  Net -501 ml    Filed Weights   02/09/22 1718  Weight: 52.6 kg    Examination:  General appearance: Awake alert.  In no distress Resp: Clear to auscultation bilaterally.  Normal effort Cardio: S1-S2 is normal regular.  No S3-S4.  No rubs murmurs or bruit GI: Abdomen is soft.  Nontender nondistended.  Bowel sounds are present normal.  No masses organomegaly Extremities: No edema.  Improved range of motion of the lower extremities Neurologic: Alert and oriented x3.  No focal neurological deficits.       Data Reviewed: I  have personally reviewed following labs and imaging studies  CBC: Recent Labs  Lab 02/09/22 2055 02/11/22 0804 02/13/22 0702  WBC 4.5 5.4 4.4  NEUTROABS 3.1  --   --   HGB 11.8* 11.5* 10.8*  HCT 36.2 35.3* 33.4*  MCV 101.1* 100.0 100.0  PLT 129* 161 161    Basic Metabolic Panel: Recent Labs  Lab 02/11/22 0804 02/12/22 0528 02/13/22 0702   NA 131* 137 136  K 5.1 5.0 4.5  CL 100 104 102  CO2 '24 25 26  '$ GLUCOSE 94 94 94  BUN 24* 25* 26*  CREATININE 1.40* 1.42* 1.49*  CALCIUM 8.8* 9.3 9.4    GFR: Estimated Creatinine Clearance: 19.3 mL/min (A) (by C-G formula based on SCr of 1.49 mg/dL (H)).      Radiology Studies: No results found.    Scheduled Meds:  acetaminophen  500 mg Oral TID   ALPRAZolam  0.5 mg Oral QHS   amiodarone  200 mg Oral Daily   apixaban  2.5 mg Oral BID   cholecalciferol  1,000 Units Oral Daily   cycloSPORINE  1 drop Both Eyes BID   gabapentin  100 mg Oral TID   hydrALAZINE  25 mg Oral TID   HYDROcodone-acetaminophen  1 tablet Oral TID   levothyroxine  75 mcg Oral QAC breakfast   lidocaine  1 patch Transdermal Q24H   omega-3 acid ethyl esters  1 g Oral Daily   pantoprazole  40 mg Oral Daily   polyethylene glycol  17 g Oral BID   polyvinyl alcohol  1 drop Both Eyes QID   pravastatin  40 mg Oral QPM   rOPINIRole  0.25 mg Oral TID   senna-docusate  2 tablet Oral BID   traZODone  100 mg Oral QHS   Continuous Infusions:   LOS: 3 days     Bonnielee Haff, MD [facility]   If 7PM-7AM, please contact night-coverage [email or web address]  02/13/2022, 11:20 AM

## 2022-02-13 NOTE — TOC Progression Note (Signed)
Transition of Care [facility]) - Progression Note    Patient Details  Name: Jacqueline [name] MRN: [id number] Date of Birth: [birth year]  Transition of Care [facility]) CM/SW Contact  Marina Desire, Juliann Pulse, RN Phone Number: 02/13/2022, 2:53 PM  Clinical Narrative:  Will provide bed offers, await choice prior auth.   1. 1.5 mi [facility] [place], [place] [address] [phone] Overall rating Much below average 2. 1.9 mi [facility] [place] [address] [place], [place] [address] [phone] Overall rating Average 3. 2 mi [facility] [address] [place], [place] [address] [phone] Overall rating Below average 4. 2.4 mi [facility] [place], [place] [address] [phone] Overall rating Above average 5. 2.6 mi [facility] [place], [place] [address] [phone] Overall rating Much below average 6. 3.4 mi [place], [place] [address] [phone] Overall rating Much below average 7. 3.7 mi [facility] [place], [place] [address] [phone] Overall rating Much above average 8. 3.9 mi [facility] [place], [place] [address] [phone] Overall rating Much above average 9. [place] [facility] [address] [place], [place] [address] [phone] Overall rating Average 10. 4.2 mi [place] [address] [place], [place] [address] [phone] Overall rating Much below average 11. 4.6 mi [facility] [address] [place], [place] [address] [phone] Overall rating Much below average 12. 6.3 mi [facility] [address] [place], [place] [address] [phone] Overall rating Above average 13. 9.2 mi [place], [place] [address] [phone] Overall rating Below average 14. 9.6 [place], [place] [address] [phone] Overall rating Much above average 15. 9.8 mi [facility] [address] [place], [place] [address] [phone] Overall rating Above average 16. 9.9 mi [facility] [address] [place], [place] [address] [phone] Overall rating Much above average 17. 10.7 mi [place] [facility] [address] [place], [place] [address] [phone] Overall rating Much above average 18. 13.3 [facility] [address] [place], [place] [address] [phone] Overall rating Much below average 19. 13.6 mi [facility] [address] [place], [place] [address] [phone] Overall rating Much below average 20. 14.2 mi [place] [facility] [place], [place] [address] [phone] Overall rating Much above average 21. 14.4 [place] [address] [place], [place] [address] [phone] Overall rating Much below average 22. 15 mi [facility] [place], [place] [address] [phone] Overall rating Much above average 23. 15.2 mi [place] [address] [place],  [address] [phone] Overall rating Above average 24. 15.3 mi [facility] [address] [place],  [address] [phone] Overall rating Below average 25. 16.9 mi [facility] [place],  [address] [phone] Overall rating Average 26. 18.1 mi [place],  [address] [phone]  Overall rating Below average 27. 18.9 mi [facility] [address] [place], [place] [address] [phone] Overall rating Much below average 28. 19.7 mi [facility] and [facility] [place], [place] [address] [phone] Overall rating Much below average 29. 20.2 mi [address] at [facility], [place] [address] [phone] Overall rating Much above average 30. 20.4 mi [facility] [address] [place], [place] [address] [phone] Overall rating Much below average 31. 20.4 mi [facility] [address] [place], [place] [address] [phone] Overall rating Much below average 32. 20.6 [facility] [address] [place], [place] [address] [phone] Overall rating Much above average 33. 21.2 [address] [place], [place] [address] [phone] Overall rating Below average 34. 22.7 mi [facility] [address] [place], [place] [address] [phone] Overall rating Below average 35. 22.8 mi [facility] [address] [place], [place] [address] [phone] Overall rating Much above average 36. 23.1 mi [facility] [address] [place], [place] [address] [phone] Overall rating Average 37. 23.5 mi [facility] [address] [place], [place] [address] [phone] Overall rating Above average 38. 23.7 [place], [place] [address] [phone] Overall rating Not available18 39. [place], [place] [address] [phone] Overall rating Much below average 40. 24.8 mi [place] [address] [place], [place] [address] [phone] Overall rating Above average To explore and download nursing home data,visit the d  Expected Discharge Plan: [place] Barriers to Discharge: Continued Medical Work up  Expected Discharge Plan and Services Expected Discharge Plan: [place]   Discharge Planning Services: CM Consult Post Acute Care Choice: [place] arrangements for the past 2 months: Single Family Home                                       Social Determinants of Health (SDOH) Interventions    Readmission Risk Interventions     No data to display

## 2022-02-13 NOTE — Care Management Important Message (Signed)
Important Message  Patient Details IM Letter given to the Patient. Name: Jacqueline [name] MRN: [id number] Date of Birth: July 24, 1931   Medicare Important Message Given:  Yes     Kerin Salen 02/13/2022, 10:12 AM

## 2022-02-14 ENCOUNTER — Inpatient Hospital Stay (HOSPITAL_COMMUNITY): Payer: PPO

## 2022-02-14 DIAGNOSIS — I480 Paroxysmal atrial fibrillation: Secondary | ICD-10-CM | POA: Diagnosis not present

## 2022-02-14 DIAGNOSIS — I5032 Chronic diastolic (congestive) heart failure: Secondary | ICD-10-CM | POA: Diagnosis not present

## 2022-02-14 DIAGNOSIS — S32591A Other specified fracture of right pubis, initial encounter for closed fracture: Secondary | ICD-10-CM | POA: Diagnosis not present

## 2022-02-14 MED ORDER — HYDROCODONE-ACETAMINOPHEN 7.5-325 MG PO TABS
1.0000 | ORAL_TABLET | Freq: Three times a day (TID) | ORAL | Status: AC
  Administered 2022-02-14 – 2022-02-16 (×6): 1 via ORAL
  Filled 2022-02-14 (×6): qty 1

## 2022-02-14 NOTE — Discharge Summary (Signed)
[facility]  Physician Discharge Summary   Patient ID: Sarh Kirschenbaum MRN: [id number] DOB/AGE: 1930/08/02 88 y.o.  Admit date: 02/09/2022 Discharge date:   02/16/2022   PCP: [name], MD  DISCHARGE DIAGNOSES:  Principal Problem:   Inferior pubic ramus fracture (HCC) Active Problems:     Paroxysmal atrial fibrillation (HCC)   Tachy-brady syndrome (HCC)   Chronic diastolic CHF (congestive heart failure) ([place])   Hypertension   Hypothyroidism   Right rib fracture    RECOMMENDATIONS FOR OUTPATIENT FOLLOW UP: Check CBC and basic metabolic panel in 3 to 4 days Monitor blood pressures closely and consider resuming amlodipine if blood pressure reaches hypertensive range Please note that scheduled Vicodin is being tapered down gradually.    Home Health: Going to SNF Equipment/Devices: None  CODE STATUS: Full code  DISCHARGE CONDITION: fair  Diet recommendation: Heart healthy  INITIAL HISTORY: 88 year old with past medical history significant of tachybradycardia syndrome, paroxysmal A-fib Eliquis, osteoporosis, chronic diastolic heart failure, hypothyroidism, CKD stage III, CAD status post CABG hyperlipidemia who presents with worsening right lower extremity pain and difficulty with ambulation following a recent fall.  Patient fell in her bathroom on 7/14 and lost balance while trying to shave her legs.  She presented to the ED and she was found to have a right 10th rib fracture, x-ray also showed right inferior pubic Ramey fracture.    HOSPITAL COURSE:   Inferior pubic ramus fracture: Secondary to mechanical fall.  Patient was experiencing a lot of pain and discomfort.  Pain medication doses were adjusted.  Scheduled Vicodin was also ordered.   Pain is better controlled with this regimen of scheduled Vicodin and as needed Vicodin.  Mobility has improved slightly as well. Waiting on skilled nursing facility bed to become available.  She needs short-term  rehab.   Ribs fracture: Continue pain medications.  Incentive spirometry.   Required oxygen intermittently.  Chest x-ray was unremarkable.  Likely due to atelectasis.   Seems to be doing better now.  Continue incentive spirometry.     Constipation Likely due to pain, immobility and pain medications.  TSH was normal back in March.  She is on scheduled laxatives.  Has had multiple bowel movements during this hospital stay.  Continue laxatives.   Chronic diastolic heart failure: Compensated.   History of CABG: Stable.  Continue home medications including statin.     Tachy-bradycardia syndrome: Stable.  Monitor   Paroxysmal A-fib: Continue with Eliquis.  Noted to be on amiodarone as well.   Hypothyroidism: Continue with Synthroid   Essential hypertension: Continue with hydralazine.  Holding amlodipine due to soft blood pressures.   Normocytic anemia No significant drop in hemoglobin noted.     Hyponatremia Monitor for now.  Possibly due to hypovolemia.  Improved.  Chronic kidney disease stage 3b Renal function seems to be close to baseline.  Continue to monitor urine output.   Patient is stable.  Okay for discharge to SNF when bed is available.   PERTINENT LABS:  The results of significant diagnostics from this hospitalization (including imaging, microbiology, ancillary and laboratory) are listed below for reference.     Labs:   Basic Metabolic Panel: Recent Labs  Lab 02/11/22 0804 02/12/22 0528 02/13/22 0702  NA 131* 137 136  K 5.1 5.0 4.5  CL 100 104 102  CO2 '24 25 26  '$ GLUCOSE 94 94 94  BUN 24* 25* 26*  CREATININE 1.40* 1.42* 1.49*  CALCIUM 8.8* 9.3 9.4    CBC:  Recent Labs  Lab 02/09/22 2055 02/11/22 0804 02/13/22 0702  WBC 4.5 5.4 4.4  NEUTROABS 3.1  --   --   HGB 11.8* 11.5* 10.8*  HCT 36.2 35.3* 33.4*  MCV 101.1* 100.0 100.0  PLT 129* 161 181      IMAGING STUDIES DG CHEST PORT 1 VIEW  Result Date: 02/14/2022 CLINICAL DATA:  Hypoxia.  EXAM: PORTABLE CHEST 1 VIEW COMPARISON:  One-view chest x-ray 02/06/2022 FINDINGS: Moderate size is normal. Atherosclerotic calcifications are present at the aortic arch. Lungs are clear. No edema or effusion is present. No focal airspace disease is present. Advanced degenerative changes are noted at the shoulders bilaterally. IMPRESSION: 1. No acute cardiopulmonary disease. 2. Aortic atherosclerosis. Electronically Signed   By: San Morelle M.D.   On: 02/14/2022 08:17   DG Hip Unilat W or Wo Pelvis 2-3 Views Right  Result Date: 02/09/2022 CLINICAL DATA:  Fall EXAM: DG HIP (WITH OR WITHOUT PELVIS) 2-3V RIGHT COMPARISON:  CT 02/06/2022 FINDINGS: SI joints are non widened. Pubic symphysis is intact. Acute right inferior pubic ramus fracture. Femoral heads are normally position. Vascular calcifications IMPRESSION: Acute right inferior pubic ramus fracture Electronically Signed   By: Donavan Foil M.D.   On: 02/09/2022 19:30   DG Femur Min 2 Views Right  Result Date: 02/09/2022 CLINICAL DATA:  Fall EXAM: RIGHT FEMUR 2 VIEWS COMPARISON:  CT 02/06/2022 FINDINGS: Femoral head is normally positioned. No fracture or malalignment of the femur. Right knee replacement with normal alignment. Acute minimally displaced right inferior pubic ramus fracture. IMPRESSION: Acute minimally displaced right inferior pubic ramus fracture Electronically Signed   By: Donavan Foil M.D.   On: 02/09/2022 19:29   CT ABDOMEN PELVIS WO CONTRAST  Result Date: 02/06/2022 CLINICAL DATA:  [place], right flank and right hip pain EXAM: CT ABDOMEN AND PELVIS WITHOUT CONTRAST TECHNIQUE: Multidetector CT imaging of the abdomen and pelvis was performed following the standard protocol without IV contrast. Unenhanced CT was performed per clinician order. Lack of IV contrast limits sensitivity and specificity, especially for evaluation of abdominal/pelvic solid viscera. RADIATION DOSE REDUCTION: This exam was performed according to the  departmental dose-optimization program which includes automated exposure control, adjustment of the mA and/or kV according to patient size and/or use of iterative reconstruction technique. COMPARISON:  02/21/2011 FINDINGS: Lower chest: A trace right pleural effusion. No airspace disease or pneumothorax. Hepatobiliary: Minimal higher attenuation within the dependent portion the gallbladder could reflect sludge. No evidence of cholelithiasis or cholecystitis. Unremarkable unenhanced appearance of the liver. Pancreas: Unremarkable unenhanced appearance of the pancreas. Diffuse fatty atrophy. Spleen: Unremarkable unenhanced appearance. Adrenals/Urinary Tract: Punctate 3 mm nonobstructing right renal calculus. No left-sided calculi or obstructive uropathy. Bladder is moderately distended with no focal abnormalities. The adrenals are grossly normal. Stomach/Bowel: No bowel obstruction or ileus. Moderate retained stool throughout the colon consistent with constipation. No bowel wall thickening or inflammatory change. Vascular/Lymphatic: Aortic atherosclerosis. No enlarged abdominal or pelvic lymph nodes. Reproductive: Status post hysterectomy. No adnexal masses. Other: No free fluid or free intraperitoneal gas. No abdominal wall hernia. Musculoskeletal: Minimally displaced right posterior tenth rib fracture. No other acute bony abnormalities. Reconstructed images demonstrate no additional findings. IMPRESSION: 1. Minimally displaced right posterior tenth rib fracture, consistent with history of recent trauma. Trace right pleural effusion with no evidence of pneumothorax. 2. Moderate retained stool throughout the colon consistent with constipation. 3. Nonobstructing 3 mm right renal calculus. 4. Gallbladder sludge. No evidence of cholelithiasis or cholecystitis. 5.  Aortic Atherosclerosis (ICD10-I70.0). Electronically  Signed   By: Randa Ngo M.D.   On: 02/06/2022 21:58   CT Head Wo Contrast  Result Date:  02/06/2022 CLINICAL DATA:  Head trauma, moderate-severe EXAM: CT HEAD WITHOUT CONTRAST TECHNIQUE: Contiguous axial images were obtained from the base of the skull through the vertex without intravenous contrast. RADIATION DOSE REDUCTION: This exam was performed according to the departmental dose-optimization program which includes automated exposure control, adjustment of the mA and/or kV according to patient size and/or use of iterative reconstruction technique. COMPARISON:  02/09/2021 FINDINGS: Brain: Normal anatomic configuration. Parenchymal volume loss is commensurate with the patient's age. Stable, moderate periventricular white matter changes are present likely reflecting the sequela of small vessel ischemia. No abnormal intra or extra-axial mass lesion or fluid collection. No abnormal mass effect or midline shift. No evidence of acute intracranial hemorrhage or infarct. Ventricular size is normal. Cerebellum unremarkable. Vascular: No asymmetric hyperdense vasculature at the skull base. Skull: Intact Sinuses/Orbits: Paranasal sinuses are clear. Orbits are unremarkable. Other: Mastoid air cells and middle ear cavities are clear. IMPRESSION: 1. No acute intracranial hemorrhage or infarct. 2. Stable senescent changes. Electronically Signed   By: Fidela Salisbury M.D.   On: 02/06/2022 21:56   DG Chest Port 1 View  Result Date: 02/06/2022 CLINICAL DATA:  Fall. EXAM: PORTABLE CHEST 1 VIEW COMPARISON:  Chest radiograph dated 11/16/2021. FINDINGS: No focal consolidation, pleural effusion, pneumothorax. Relatively similar appearance of right pneumatocele. Top-normal cardiac silhouette. Median sternotomy wires a postsurgical changes of CABG. Atherosclerotic calcification of the aorta. No acute osseous pathology. IMPRESSION: 1. No acute cardiopulmonary process. 2. Chronic right pneumatocele. Electronically Signed   By: Anner Crete M.D.   On: 02/06/2022 20:37    DISCHARGE EXAMINATION: Vitals:   02/15/22 0541  02/15/22 1237 02/15/22 2010 02/16/22 0534  BP: (!) 125/47 (!) 144/54 (!) 117/53 (!) 118/50  Pulse: (!) 56 60 (!) 55 (!) 53  Resp: '16 16 18 16  '$ Temp: 99.3 F (37.4 C) 97.8 F (36.6 C) 99 F (37.2 C) 98.8 F (37.1 C)  TempSrc: Oral Oral Oral Oral  SpO2: 91% 94% 96% 95%  Weight:      Height:       General appearance: Awake alert.  In no distress Resp: Clear to auscultation bilaterally.  Normal effort Cardio: S1-S2 is normal regular.  No S3-S4.  No rubs murmurs or bruit GI: Abdomen is soft.  Nontender nondistended.  Bowel sounds are present normal.  No masses organomegaly    DISPOSITION: SNF  Discharge Instructions     Call MD for:  difficulty breathing, headache or visual disturbances   Complete by: As directed    Call MD for:  extreme fatigue   Complete by: As directed    Call MD for:  persistant dizziness or light-headedness   Complete by: As directed    Call MD for:  persistant nausea and vomiting   Complete by: As directed    Call MD for:  severe uncontrolled pain   Complete by: As directed    Call MD for:  temperature >100.4   Complete by: As directed    Diet general   Complete by: As directed    Discharge instructions   Complete by: As directed    Please review instructions on the discharge summary.  You were cared for by a hospitalist during your hospital stay. If you have any questions about your discharge medications or the care you received while you were in the hospital after you are discharged, you can  call the unit and asked to speak with the hospitalist on call if the hospitalist that took care of you is not available. Once you are discharged, your primary care physician will handle any further medical issues. Please note that NO REFILLS for any discharge medications will be authorized once you are discharged, as it is imperative that you return to your primary care physician (or establish a relationship with a primary care physician if you do not have one) for  your aftercare needs so that they can reassess your need for medications and monitor your lab values. If you do not have a primary care physician, you can call [phone] for a physician referral.   Increase activity slowly   Complete by: As directed           Allergies as of 02/16/2022       Reactions   Atorvastatin Other (See Comments)   Myalgias in legs   Beta Adrenergic Blockers Other (See Comments)   Severe bradycardia   Crestor [rosuvastatin] Other (See Comments)   Myalgias in legs   Morphine And Related Other (See Comments)   "Crazy thoughts, severe headache, sick"   Prednisone    On two occasions has caused A fib        Medication List     STOP taking these medications    amLODipine 5 MG tablet Commonly known as: NORVASC   polyethylene glycol powder 17 GM/SCOOP powder Commonly known as: [employer] Purelax Replaced by: polyethylene glycol 17 g packet   traMADol 50 MG tablet Commonly known as: ULTRAM       TAKE these medications    acetaminophen 500 MG tablet Commonly known as: TYLENOL Take 1,000 mg by mouth every 6 (six) hours as needed for moderate pain or headache (pain/headache.).   ALPRAZolam 0.5 MG tablet Commonly known as: XANAX Take 1 tablet (0.5 mg total) by mouth at bedtime.   amiodarone 200 MG tablet Commonly known as: PACERONE TAKE 1 TABLET BY MOUTH EVERY DAY   apixaban 2.5 MG Tabs tablet Commonly known as: ELIQUIS Take 1 tablet (2.5 mg total) by mouth 2 (two) times daily.   cholecalciferol 25 MCG (1000 UNIT) tablet Commonly known as: VITAMIN D3 Take 1,000 Units by mouth daily.   cycloSPORINE 0.05 % ophthalmic emulsion Commonly known as: RESTASIS Place 1 drop into both eyes 2 (two) times daily.   Fish Oil 1000 MG Caps Take 1,000 mg by mouth every evening.   gabapentin 100 MG capsule Commonly known as: NEURONTIN TAKE 1 CAPSULE (100 MG TOTAL) BY MOUTH 3 (THREE) TIMES DAILY.   hydrALAZINE 25 MG tablet Commonly known as:  APRESOLINE Take 1 tablet (25 mg total) by mouth in the morning and at bedtime. What changed: when to take this   HYDROcodone-acetaminophen 5-325 MG tablet Commonly known as: NORCO/VICODIN Take 1-2 tablets by mouth every 6 (six) hours as needed for severe pain.   HYDROcodone-acetaminophen 7.5-325 MG tablet Commonly known as: NORCO Take 1 tablet 3 times a day for 2 days followed by 1 tablet twice a day for 2 days and then stop   levothyroxine 75 MCG tablet Commonly known as: SYNTHROID TAKE 1 TABLET BY MOUTH EVERY DAY What changed: when to take this   lidocaine 5 % Commonly known as: LIDODERM Place 1 patch onto the skin daily. Remove & Discard patch within 12 hours or as directed by MD   Lubricant Eye Drops 0.4-0.3 % Soln Generic drug: Polyethyl Glycol-Propyl Glycol Place 1 drop into both eyes  4 (four) times daily.   midodrine 2.5 MG tablet Commonly known as: PROAMATINE TAKE 1 TABLET BY MOUTH IN MORNING AND 1 TAB 6 HOURS LATER, DO NOT LAY FLAT FOR 4 HOURS AFTER TAKING THE MEDICATION. DO NOT TAKE FOR A SYSTOLIC OF 465 OF ABOVE. What changed: See the new instructions.   multivitamin with minerals Tabs tablet Take 1 tablet by mouth daily.   nitroGLYCERIN 0.4 MG SL tablet Commonly known as: NITROSTAT Place 1 tablet (0.4 mg total) under the tongue every 5 (five) minutes as needed for chest pain.   omeprazole 20 MG capsule Commonly known as: PRILOSEC TAKE 1 CAPSULE BY MOUTH EVERY DAY   polyethylene glycol 17 g packet Commonly known as: MIRALAX / GLYCOLAX Take 17 g by mouth 2 (two) times daily. Replaces: polyethylene glycol powder 17 GM/SCOOP powder   pravastatin 40 MG tablet Commonly known as: PRAVACHOL TAKE 1 TABLET BY MOUTH EVERY DAY IN THE EVENING What changed:  how much to take how to take this   rOPINIRole 0.25 MG tablet Commonly known as: REQUIP TAKE 1 TABLET BY MOUTH THREE TIMES A DAY   senna-docusate 8.6-50 MG tablet Commonly known as: Senokot-S Take 2  tablets by mouth 2 (two) times daily.   traZODone 100 MG tablet Commonly known as: DESYREL TAKE 1 TABLET BY MOUTH EVERYDAY AT BEDTIME What changed: See the new instructions.          Contact information for after-discharge care     Destination     [facility] Preferred SNF .   Service: Skilled Nursing Contact information: [address] [place] [phone]                     TOTAL DISCHARGE TIME: 44 mins  [place]  [facility] Pager on [email or web address]  02/16/2022, 9:42 AM

## 2022-02-14 NOTE — TOC Progression Note (Signed)
Transition of Care [facility] - Progression Note   Patient Details  Name: Jacqueline [name] MRN: [id number] Date of Birth: 1931/07/25  Transition of [facility]) CM/SW [place], LCSW Phone Number: 02/14/2022, 2:36 PM  Clinical Narrative: Patient has reviewed bed offers and chose [facility] due to proximity to her family. CSW attempted to call Sharyn Lull in admissions at [facility], but left VM as admissions could not be reached. CSW called [facility] Cow[facility] and confirmed no one in admissions will be available until Monday (02/16/21).  CSW called [employer] to start insurance authorization for SNF and PTAR. The facility provided is tentatively [facility]. TOC awaiting bed confirmation from [facility] and insurance approval.  Expected Discharge Plan: [place] Barriers to Discharge: Continued Medical Work up  Expected Discharge Plan and Services Expected Discharge Plan: [place] Discharge Planning Services: CM Consult Post Acute Care Choice: [place] arrangements for the past 2 months: Single Family Home  Readmission Risk Interventions     No data to display

## 2022-02-14 NOTE — Progress Notes (Signed)
PROGRESS NOTE    Jacqueline [name]  LOV:[id number] DOB: 11/05/1930 DOA: 02/09/2022 PCP: [name], MD   Brief Narrative: 88 year old with past medical history significant of tachybradycardia syndrome, paroxysmal A-fib Eliquis, osteoporosis, chronic diastolic heart failure, hypothyroidism, CKD stage III, CAD status post CABG hyperlipidemia who presents with worsening right lower extremity pain and difficulty with ambulation following a recent fall.  Patient fell in her bathroom on 7/14 and lost balance while trying to shave her legs.  She presented to the ED and she was found to have a right 10th rib fracture, x-ray also showed right inferior pubic Ramey fracture.   Assessment & Plan:   Inferior pubic ramus fracture: Secondary to mechanical fall.  Patient was experiencing a lot of pain and discomfort.  Pain medication doses were adjusted.  Scheduled Vicodin was also ordered.   Pain is better controlled with this regimen of scheduled Vicodin and as needed Vicodin.  Mobility has improved slightly as well. Waiting on skilled nursing facility bed to become available.  She needs a short-term rehab.  Ribs fracture: Continue pain medications.  Incentive spirometry.  Has required oxygen during the nighttime.  Discussed with nursing staff for incentive spirometry.  Chest x-ray was done this morning which does not show any acute findings.  Oxygen requirement likely due to atelectasis and poor effort.  Constipation Likely due to pain, immobility and pain medications.  TSH was normal back in March.  She is on scheduled laxatives.  Has had multiple bowel movements.  Continue laxatives.  Chronic diastolic heart failure: Compensated.  History of CABG: Stable.  Continue home medications including statin.    Tachy-bradycardia syndrome: Stable.  Monitor  Paroxysmal A-fib: Continue with Eliquis.  Noted to be on amiodarone as well.  Hypothyroidism: Continue with Synthroid  Essential  hypertension: Continue with hydralazine.  Amlodipine on hold currently.  Blood pressure reasonably well controlled.  Normocytic anemia No significant drop in hemoglobin noted.  Continue to monitor.  Hyponatremia Monitor for now.  Possibly due to hypovolemia.  Improved.  Chronic kidney disease stage 3b Renal function seems to be close to baseline.  Continue to monitor urine output.   DVT prophylaxis: Eliquis Code Status: Full code Family Communication: Discussed with patient Disposition Plan: Skilled nursing facility recommended by physical therapy. Bed availability and insurance authorization.  Status is: Inpatient Remains inpatient appropriate because: Uncontrolled pain   Consultants:  none  Procedures:  None  Antimicrobials:    Subjective: Pain is better but still present.  Denies any chest pain shortness of breath cough.  No fever or chills.  No nausea or vomiting.  Objective: Vitals:   02/13/22 2048 02/14/22 0400 02/14/22 0437 02/14/22 0852  BP: 132/63 (!) 116/49  137/63  Pulse: (!) 57 (!) 51  63  Resp: 18 20    Temp: 98.8 F (37.1 C) 98.2 F (36.8 C)    TempSrc: Oral     SpO2: 95% 90% 93% 93%  Weight:      Height:        Intake/Output Summary (Last 24 hours) at 02/14/2022 1020 Last data filed at 02/14/2022 0948 Gross per 24 hour  Intake 480 ml  Output --  Net 480 ml    Filed Weights   02/09/22 1718  Weight: 52.6 kg    Examination:  General appearance: Awake alert.  In no distress Resp: Diminished air entry at the bases.  No wheezing or rhonchi.  No definite crackles. Cardio: S1-S2 is normal regular.  No S3-S4.  No rubs murmurs or bruit GI: Abdomen is soft.  Nontender nondistended.  Bowel sounds are present normal.  No masses organomegaly Extremities: Improved range of motion of bilateral lower extremities Neurologic: Alert and oriented x3.  No focal neurological deficits.      Data Reviewed: I have personally reviewed following labs and  imaging studies  CBC: Recent Labs  Lab 02/09/22 2055 02/11/22 0804 02/13/22 0702  WBC 4.5 5.4 4.4  NEUTROABS 3.1  --   --   HGB 11.8* 11.5* 10.8*  HCT 36.2 35.3* 33.4*  MCV 101.1* 100.0 100.0  PLT 129* 161 098    Basic Metabolic Panel: Recent Labs  Lab 02/11/22 0804 02/12/22 0528 02/13/22 0702  NA 131* 137 136  K 5.1 5.0 4.5  CL 100 104 102  CO2 '24 25 26  '$ GLUCOSE 94 94 94  BUN 24* 25* 26*  CREATININE 1.40* 1.42* 1.49*  CALCIUM 8.8* 9.3 9.4    GFR: Estimated Creatinine Clearance: 19.3 mL/min (A) (by C-G formula based on SCr of 1.49 mg/dL (H)).      Radiology Studies: DG CHEST PORT 1 VIEW  Result Date: 02/14/2022 CLINICAL DATA:  Hypoxia. EXAM: PORTABLE CHEST 1 VIEW COMPARISON:  One-view chest x-ray 02/06/2022 FINDINGS: Moderate size is normal. Atherosclerotic calcifications are present at the aortic arch. Lungs are clear. No edema or effusion is present. No focal airspace disease is present. Advanced degenerative changes are noted at the shoulders bilaterally. IMPRESSION: 1. No acute cardiopulmonary disease. 2. Aortic atherosclerosis. Electronically Signed   By: San Morelle M.D.   On: 02/14/2022 08:17      Scheduled Meds:  acetaminophen  500 mg Oral TID   ALPRAZolam  0.5 mg Oral QHS   amiodarone  200 mg Oral Daily   apixaban  2.5 mg Oral BID   cholecalciferol  1,000 Units Oral Daily   cycloSPORINE  1 drop Both Eyes BID   gabapentin  100 mg Oral TID   hydrALAZINE  25 mg Oral TID   HYDROcodone-acetaminophen  1 tablet Oral TID   levothyroxine  75 mcg Oral QAC breakfast   lidocaine  1 patch Transdermal Q24H   omega-3 acid ethyl esters  1 g Oral Daily   pantoprazole  40 mg Oral Daily   polyethylene glycol  17 g Oral BID   polyvinyl alcohol  1 drop Both Eyes QID   pravastatin  40 mg Oral QPM   rOPINIRole  0.25 mg Oral TID   senna-docusate  2 tablet Oral BID   traZODone  100 mg Oral QHS   Continuous Infusions:   LOS: 4 days     Bonnielee Haff,  MD [facility]   If 7PM-7AM, please contact night-coverage [email or web address]  02/14/2022, 10:20 AM

## 2022-02-14 NOTE — Plan of Care (Signed)

## 2022-02-15 DIAGNOSIS — I10 Essential (primary) hypertension: Secondary | ICD-10-CM | POA: Diagnosis not present

## 2022-02-15 DIAGNOSIS — S32591A Other specified fracture of right pubis, initial encounter for closed fracture: Secondary | ICD-10-CM | POA: Diagnosis not present

## 2022-02-15 DIAGNOSIS — I480 Paroxysmal atrial fibrillation: Secondary | ICD-10-CM | POA: Diagnosis not present

## 2022-02-15 NOTE — Progress Notes (Signed)
PROGRESS NOTE    Jacqueline [name]  DGU:[id number] DOB: [birth year] DOA: 02/09/2022 PCP: [name], MD   Brief Narrative: 88 year old with past medical history significant of tachybradycardia syndrome, paroxysmal A-fib Eliquis, osteoporosis, chronic diastolic heart failure, hypothyroidism, CKD stage III, CAD status post CABG hyperlipidemia who presents with worsening right lower extremity pain and difficulty with ambulation following a recent fall.  Patient fell in her bathroom on 7/14 and lost balance while trying to shave her legs.  She presented to the ED and she was found to have a right 10th rib fracture, x-ray also showed right inferior pubic Ramey fracture.   Assessment & Plan:   Inferior pubic ramus fracture: Secondary to mechanical fall.  Patient was experiencing a lot of pain and discomfort.  Pain medication doses were adjusted.  Scheduled Vicodin was also ordered.   Pain is better controlled with this regimen of scheduled Vicodin and as needed Vicodin.  Mobility has improved slightly as well. Waiting on skilled nursing facility bed to become available.  She needs short-term rehab.  Ribs fracture: Continue pain medications.  Incentive spirometry.   Required oxygen intermittently.  Chest x-ray was unremarkable.  Likely due to atelectasis.   Seems to be doing better now.  Continue incentive spirometry.    Constipation Likely due to pain, immobility and pain medications.  TSH was normal back in March.  She is on scheduled laxatives.  Has had multiple bowel movements during this hospital stay.  Continue laxatives.  Chronic diastolic heart failure: Compensated.  History of CABG: Stable.  Continue home medications including statin.    Tachy-bradycardia syndrome: Stable.  Monitor  Paroxysmal A-fib: Continue with Eliquis.  Noted to be on amiodarone as well.  Hypothyroidism: Continue with Synthroid  Essential hypertension: Continue with hydralazine.  Amlodipine on hold  currently.  Blood pressure reasonably well controlled.  Normocytic anemia No significant drop in hemoglobin noted.  Continue to monitor.  Hyponatremia Monitor for now.  Possibly due to hypovolemia.  Improved.  Chronic kidney disease stage 3b Renal function seems to be close to baseline.  Continue to monitor urine output.   DVT prophylaxis: Eliquis Code Status: Full code Family Communication: Discussed with patient Disposition Plan: Skilled nursing facility recommended by physical therapy. Bed availability and insurance authorization.  Status is: Inpatient Remains inpatient appropriate because: Uncontrolled pain   Consultants:  none  Procedures:  None  Antimicrobials:    Subjective: Feels well.  The pain is reasonably well controlled.  No nausea vomiting.  Looking forward to rehab.   Objective: Vitals:   02/14/22 0852 02/14/22 1233 02/14/22 2011 02/15/22 0541  BP: 137/63 (!) 148/61 133/70 (!) 125/47  Pulse: 63 (!) 54 (!) 52 (!) 56  Resp:  '18 18 16  '$ Temp:  98.3 F (36.8 C) 98.5 F (36.9 C) 99.3 F (37.4 C)  TempSrc:  Oral Oral Oral  SpO2: 93% 94% 98% 91%  Weight:      Height:        Intake/Output Summary (Last 24 hours) at 02/15/2022 1035 Last data filed at 02/15/2022 0518 Gross per 24 hour  Intake 360 ml  Output 700 ml  Net -340 ml    Filed Weights   02/09/22 1718  Weight: 52.6 kg    Examination:  General appearance: Awake alert.  In no distress Resp: Clear to auscultation bilaterally.  Normal effort Cardio: S1-S2 is normal regular.  No S3-S4.  No rubs murmurs or bruit GI: Abdomen is soft.  Nontender nondistended.  Bowel sounds  are present normal.  No masses organomegaly Extremities: No edema.  Improved range of motion of lower extremities Neurologic: Alert and oriented x3.  No focal neurological deficits.       Data Reviewed: I have personally reviewed following labs and imaging studies  CBC: Recent Labs  Lab 02/09/22 2055 02/11/22 0804  02/13/22 0702  WBC 4.5 5.4 4.4  NEUTROABS 3.1  --   --   HGB 11.8* 11.5* 10.8*  HCT 36.2 35.3* 33.4*  MCV 101.1* 100.0 100.0  PLT 129* 161 378    Basic Metabolic Panel: Recent Labs  Lab 02/11/22 0804 02/12/22 0528 02/13/22 0702  NA 131* 137 136  K 5.1 5.0 4.5  CL 100 104 102  CO2 '24 25 26  '$ GLUCOSE 94 94 94  BUN 24* 25* 26*  CREATININE 1.40* 1.42* 1.49*  CALCIUM 8.8* 9.3 9.4    GFR: Estimated Creatinine Clearance: 19.3 mL/min (A) (by C-G formula based on SCr of 1.49 mg/dL (H)).      Radiology Studies: DG CHEST PORT 1 VIEW  Result Date: 02/14/2022 CLINICAL DATA:  Hypoxia. EXAM: PORTABLE CHEST 1 VIEW COMPARISON:  One-view chest x-ray 02/06/2022 FINDINGS: Moderate size is normal. Atherosclerotic calcifications are present at the aortic arch. Lungs are clear. No edema or effusion is present. No focal airspace disease is present. Advanced degenerative changes are noted at the shoulders bilaterally. IMPRESSION: 1. No acute cardiopulmonary disease. 2. Aortic atherosclerosis. Electronically Signed   By: San Morelle M.D.   On: 02/14/2022 08:17      Scheduled Meds:  acetaminophen  500 mg Oral TID   ALPRAZolam  0.5 mg Oral QHS   amiodarone  200 mg Oral Daily   apixaban  2.5 mg Oral BID   cholecalciferol  1,000 Units Oral Daily   cycloSPORINE  1 drop Both Eyes BID   gabapentin  100 mg Oral TID   hydrALAZINE  25 mg Oral TID   HYDROcodone-acetaminophen  1 tablet Oral TID   levothyroxine  75 mcg Oral QAC breakfast   lidocaine  1 patch Transdermal Q24H   omega-3 acid ethyl esters  1 g Oral Daily   pantoprazole  40 mg Oral Daily   polyethylene glycol  17 g Oral BID   polyvinyl alcohol  1 drop Both Eyes QID   pravastatin  40 mg Oral QPM   rOPINIRole  0.25 mg Oral TID   senna-docusate  2 tablet Oral BID   traZODone  100 mg Oral QHS   Continuous Infusions:   LOS: 5 days     Bonnielee Haff, MD [facility]   If 7PM-7AM, please contact  night-coverage [email or web address]  02/15/2022, 10:35 AM

## 2022-02-15 NOTE — TOC Progression Note (Signed)
Transition of Care [facility]) - Progression Note    Patient Details  Name: Jacqueline [name] MRN: [id number] Date of Birth: August 01, 1930  [facility]) CM/SW Contact  Nyjah Schwake, Juliann Pulse, RN Phone Number: 02/15/2022, 10:12 AM  Clinical Narrative:Received Josem Kaufmann for [facility] from [place] [facility] XID#[id number]. [facility] [address] Michelle admissions coordinator) left vm, then called admissions-per  Randy-admissions coordinator only available M-F to accept admissions, unless already set up prior the weekend. Noted for [facility] aware of bed available Monday. MD notified.      Expected Discharge Plan: [place] Barriers to Discharge: Continued Medical Work up  Expected Discharge Plan and Services Expected Discharge Plan: [place]   Discharge Planning Services: CM Consult Post Acute Care Choice: [place] Living arrangements for the past 2 months: Single Family Home                                       Social Determinants of Health (SDOH) Interventions    Readmission Risk Interventions     No data to display

## 2022-02-16 DIAGNOSIS — I480 Paroxysmal atrial fibrillation: Secondary | ICD-10-CM | POA: Diagnosis not present

## 2022-02-16 DIAGNOSIS — I10 Essential (primary) hypertension: Secondary | ICD-10-CM | POA: Diagnosis not present

## 2022-02-16 DIAGNOSIS — M6281 Muscle weakness (generalized): Secondary | ICD-10-CM | POA: Diagnosis not present

## 2022-02-16 DIAGNOSIS — R278 Other lack of coordination: Secondary | ICD-10-CM | POA: Diagnosis not present

## 2022-02-16 DIAGNOSIS — S2231XD Fracture of one rib, right side, subsequent encounter for fracture with routine healing: Secondary | ICD-10-CM | POA: Diagnosis not present

## 2022-02-16 DIAGNOSIS — I5032 Chronic diastolic (congestive) heart failure: Secondary | ICD-10-CM | POA: Diagnosis not present

## 2022-02-16 DIAGNOSIS — S32591A Other specified fracture of right pubis, initial encounter for closed fracture: Secondary | ICD-10-CM | POA: Diagnosis not present

## 2022-02-16 DIAGNOSIS — R2689 Other abnormalities of gait and mobility: Secondary | ICD-10-CM | POA: Diagnosis not present

## 2022-02-16 DIAGNOSIS — R2681 Unsteadiness on feet: Secondary | ICD-10-CM | POA: Diagnosis not present

## 2022-02-16 NOTE — Progress Notes (Signed)
Patient to be discharged to [facility] today. Report called to the facility Tomasa Rand LPN accepting report for this facility.

## 2022-02-16 NOTE — TOC Transition Note (Signed)
Transition of Care [facility]) - CM/SW Discharge Note   Patient Details  Name: Jacqueline [name] MRN: [id number] Date of Birth: 1931/06/04  [facility]) CM/SW Contact:  Dessa Phi, RN Phone Number: 02/16/2022, 10:05 AM   Clinical Narrative: [name]-accepted. Auth received for SNF, & [facility].  Going to rm#[id number], report tel#[phone]. [facility] called. No further CM needs.    Final next level of care: Skilled Nursing Facility Barriers to Discharge: No Barriers Identified   Patient Goals and CMS Choice Patient states their goals for this hospitalization and ongoing recovery are:: Rehab CMS Medicare.gov Compare Post Acute Care list provided to:: Patient Choice offered to / list presented to : Patient, Adult Children  Discharge Placement              Patient chooses bed at: [facility] Patient to be transferred to facility by:  Corey Harold) Name of family member notified:  (Duaine(son)) Patient and family notified of of transfer: 02/16/22  Discharge Plan and Services   Discharge Planning Services: CM Consult Post Acute Care Choice: [place]                               Social Determinants of Health (SDOH) Interventions     Readmission Risk Interventions     No data to display

## 2022-02-16 NOTE — TOC Transition Note (Signed)
Transition of Care [facility]) - CM/SW Discharge Note   Patient Details  Name: Jacqueline [name] MRN: [id number] Date of Birth: October 11, 1930  Transition [facility] Care [facility]) CM/SW Contact:  Dessa Phi, RN Phone Number: 02/16/2022, 9:37 AM   Clinical Narrative: Aida Puffer for Jacqueline [name] rep [facility] has bed available. Awaiting d/c summary.MD notified.      Final next level of care: Skilled Nursing Facility Barriers to Discharge: No Barriers Identified   Patient Goals and CMS Choice Patient states their goals for this hospitalization and ongoing recovery are:: Rehab CMS Medicare.gov Compare Post Acute Care list provided to:: Patient Choice offered to / list presented to : Patient, Adult Children  Discharge Placement                       Discharge Plan and Services   Discharge Planning Services: CM Consult Post Acute Care Choice: [place]                               Social Determinants of Health (SDOH) Interventions     Readmission Risk Interventions     No data to display

## 2022-02-17 ENCOUNTER — Telehealth: Payer: Self-pay

## 2022-02-17 DIAGNOSIS — I10 Essential (primary) hypertension: Secondary | ICD-10-CM | POA: Diagnosis not present

## 2022-02-17 DIAGNOSIS — S32591D Other specified fracture of right pubis, subsequent encounter for fracture with routine healing: Secondary | ICD-10-CM

## 2022-02-17 NOTE — Telephone Encounter (Signed)
Called and spoke to son.  Yes, patient and her family plan to leave [facility] now.  They plan to provide 24 hour supervision at home.  They ask for home PT and OT as discussed at discharge.  They also want a skilled nursing evaluation to see if patient needs additional services.  They would like this referral to occur ASAP.  Order placed.

## 2022-02-17 NOTE — Telephone Encounter (Signed)
Received phone call from patient's son, Dwayne, regarding patient.   Reports that patient was recently discharged from the hospital to [facility] place for rehab. Son states that mother had a bad experience there last night and would like to receive home health rehab services. They are requesting referral for home health PT, OT and nursing.   Will forward to PCP.   [name], RN

## 2022-02-17 NOTE — Telephone Encounter (Signed)
Referral information sent to [facility] for review.  Will wait to hear back from rep Loanne Drilling.  Arif Amendola,CMA

## 2022-02-17 NOTE — Telephone Encounter (Signed)
[facility] doesn't accept this insurance.  Referral sent to [facility].  Demita Tobia,CMA

## 2022-02-18 ENCOUNTER — Telehealth: Payer: Self-pay | Admitting: Cardiovascular Disease

## 2022-02-18 NOTE — Telephone Encounter (Signed)
I called and informed son.  Family is very [employer] attorney.

## 2022-02-18 NOTE — Telephone Encounter (Signed)
Pt c/o medication issue:  1. Name of Medication: amlodipine  2. How are you currently taking this medication (dosage and times per day)?    3. Are you having a reaction (difficulty breathing--STAT)? no  4. What is your medication issue? Patient believes she still suppose to be on this medication. Please advise

## 2022-02-18 NOTE — Telephone Encounter (Signed)
Patient was accepted by [facility].  They will plan to start nursing care this week and can start PT next week.  Jacqueline [name],CMA

## 2022-02-18 NOTE — Telephone Encounter (Signed)
Spoke to patient she stated her son picked up her medications yesterday and he picked up Amlodipine.Stated on her hospital discharge instructions Amlodipine was stopped.Advised to follow hospital discharge instructions.Stated her B/P this morning 124/77. Advised continue to monitor B/P and bring readings to appointment.Post hospital appointment scheduled with Fabian Sharp PA 8/10 at 1:55 pm.

## 2022-02-19 ENCOUNTER — Telehealth: Payer: Self-pay

## 2022-02-19 NOTE — Telephone Encounter (Signed)
Called patient.  We agreed on increase tramadol to one tab q8h.  Add tylenol, two regular strength 4xper day.  And a lidocaine patch to rib area which hurts her the most.   She agree to this plan.

## 2022-02-19 NOTE — Telephone Encounter (Signed)
Received phone call from Claiborne Billings, nurse case manager at [facility] regarding patient's pain management.   Patient has been taking tramadol 1 tablet every 12 hours, however, she is still having  uncontrolled pain during the day.   Per chart review, Hydrocodone- acetaminophen was prescribed on 7/21. Patient reports that she never received this. She also states that she did not receive any medications when she was discharged from [facility] place.   She states that she did not receive the paper prescription for norco.   Will forward to MD for further medication management.   [name], RN

## 2022-02-20 DIAGNOSIS — S2231XD Fracture of one rib, right side, subsequent encounter for fracture with routine healing: Secondary | ICD-10-CM | POA: Diagnosis not present

## 2022-02-20 DIAGNOSIS — S32501D Unspecified fracture of right pubis, subsequent encounter for fracture with routine healing: Secondary | ICD-10-CM | POA: Diagnosis not present

## 2022-02-20 DIAGNOSIS — W010XXD Fall on same level from slipping, tripping and stumbling without subsequent striking against object, subsequent encounter: Secondary | ICD-10-CM | POA: Diagnosis not present

## 2022-02-21 DIAGNOSIS — I5032 Chronic diastolic (congestive) heart failure: Secondary | ICD-10-CM | POA: Diagnosis not present

## 2022-02-21 DIAGNOSIS — Z87440 Personal history of urinary (tract) infections: Secondary | ICD-10-CM | POA: Diagnosis not present

## 2022-02-21 DIAGNOSIS — R251 Tremor, unspecified: Secondary | ICD-10-CM | POA: Diagnosis not present

## 2022-02-21 DIAGNOSIS — Z85828 Personal history of other malignant neoplasm of skin: Secondary | ICD-10-CM | POA: Diagnosis not present

## 2022-02-21 DIAGNOSIS — S32591D Other specified fracture of right pubis, subsequent encounter for fracture with routine healing: Secondary | ICD-10-CM | POA: Diagnosis not present

## 2022-02-21 DIAGNOSIS — D500 Iron deficiency anemia secondary to blood loss (chronic): Secondary | ICD-10-CM | POA: Diagnosis not present

## 2022-02-21 DIAGNOSIS — G4700 Insomnia, unspecified: Secondary | ICD-10-CM | POA: Diagnosis not present

## 2022-02-21 DIAGNOSIS — G609 Hereditary and idiopathic neuropathy, unspecified: Secondary | ICD-10-CM | POA: Diagnosis not present

## 2022-02-21 DIAGNOSIS — L89152 Pressure ulcer of sacral region, stage 2: Secondary | ICD-10-CM | POA: Diagnosis not present

## 2022-02-21 DIAGNOSIS — I130 Hypertensive heart and chronic kidney disease with heart failure and stage 1 through stage 4 chronic kidney disease, or unspecified chronic kidney disease: Secondary | ICD-10-CM | POA: Diagnosis not present

## 2022-02-21 DIAGNOSIS — Z7901 Long term (current) use of anticoagulants: Secondary | ICD-10-CM | POA: Diagnosis not present

## 2022-02-21 DIAGNOSIS — M810 Age-related osteoporosis without current pathological fracture: Secondary | ICD-10-CM | POA: Diagnosis not present

## 2022-02-21 DIAGNOSIS — E039 Hypothyroidism, unspecified: Secondary | ICD-10-CM | POA: Diagnosis not present

## 2022-02-21 DIAGNOSIS — I2721 Secondary pulmonary arterial hypertension: Secondary | ICD-10-CM | POA: Diagnosis not present

## 2022-02-21 DIAGNOSIS — N184 Chronic kidney disease, stage 4 (severe): Secondary | ICD-10-CM | POA: Diagnosis not present

## 2022-02-21 DIAGNOSIS — I495 Sick sinus syndrome: Secondary | ICD-10-CM | POA: Diagnosis not present

## 2022-02-21 DIAGNOSIS — I480 Paroxysmal atrial fibrillation: Secondary | ICD-10-CM | POA: Diagnosis not present

## 2022-02-21 DIAGNOSIS — I700 Atherosclerosis of aorta: Secondary | ICD-10-CM | POA: Diagnosis not present

## 2022-02-21 DIAGNOSIS — K219 Gastro-esophageal reflux disease without esophagitis: Secondary | ICD-10-CM | POA: Diagnosis not present

## 2022-02-21 DIAGNOSIS — E7800 Pure hypercholesterolemia, unspecified: Secondary | ICD-10-CM | POA: Diagnosis not present

## 2022-02-21 DIAGNOSIS — E785 Hyperlipidemia, unspecified: Secondary | ICD-10-CM | POA: Diagnosis not present

## 2022-02-21 DIAGNOSIS — S2231XD Fracture of one rib, right side, subsequent encounter for fracture with routine healing: Secondary | ICD-10-CM | POA: Diagnosis not present

## 2022-02-21 DIAGNOSIS — I2510 Atherosclerotic heart disease of native coronary artery without angina pectoris: Secondary | ICD-10-CM | POA: Diagnosis not present

## 2022-02-21 DIAGNOSIS — F4320 Adjustment disorder, unspecified: Secondary | ICD-10-CM | POA: Diagnosis not present

## 2022-02-23 NOTE — Telephone Encounter (Signed)
Eliquis assistance has been approved through 07/26/22

## 2022-02-24 NOTE — Progress Notes (Deleted)
Cardiology Office Note:    Date:  02/24/2022   ID:  Jacqueline [name], DOB 1930-12-27, MRN [id number]  PCP:  [name], MD   [place] Prov[facility] Cardiologist:  Sanda Klein, MD Cardiology APP:  Ledora Bottcher, PA { Click to update primary MD,subspecialty MD or APP then REFRESH:1}    Referring MD: [name], MD   No chief complaint on file. ***  History of Present Illness:    Jacqueline [name] is a 88 y.o. female with a hx of Jacqueline [name] is a 88 y.o. female with a hx of hypertension, orthostatic hypotension with falls, PAF/flutter, chronic anticoagulation, CAD s/p CABG in 2006, HFpEF, and hyperlipidemia.  She has historically not tolerated a beta-blocker or higher doses of amiodarone.  She has been quite stable on 200 mg amnio daily.  She is anticoagulated with 2.5 mg Eliquis twice daily for age and renal function.  She was maintained on 25 mg hydralazine 3 times daily for hypertension with a goal SBP of 160.  Unfortunately she has orthostatic symptoms with a blood pressure below 120.  Given her orthostatic hypotension, antianginal and diuretic therapy has been limited.  She has as needed midodrine to use at home.  She is very good at tracking her blood pressure.  She recently called our office 09/2021 with elevated BP on 25 mg 3 times daily hydralazine was increased to 37.5 mg 3 times daily.  She presented to [facility] with symptoms of URI and tested positive for rhinovirus. Cardiology was consulted for RVR. Telemetry with bouts of Afib and sinus rhythm.  She is asymptomatic with her RVR.  No medication changes were made, she was continued on 200 mg amiodarone.  Her pressure remained in the 140s to 150s without hydralazine.  She was discharged without hydralazine and instructions to track her blood pressure until follow-up. I saw her for post-hospital follow up on 11/06/21. She was still taking 37.5 mg hydralazine TID.  She reported dizziness daily and one  mechanical fall. BP log revealed pressures in the 100-130s, which is low for her and places her at higher risk for orthostatic symptoms. Given her BP goal of 150-160s, I adjusted her regimen to:   25 mg hydralazine for SBP > 160. Take midodrine for SBP less than 130. If she takes hydralazine, she will recheck her BP approximately 8 hours later to see if she needs to take additional hydralazine. I was worried about these PRN instructions and asked her to keep close follow up. She saw Dr. [name] 12/03/21 with elevated BP of 180-190s on 25 mg hydralazine TID. He restarted 2.5 mg amlodipine. She was off of midodrine.   She presents back for follow up.      Atrial fibrillation with RVR Persistent atrial fibrillation Continue amiodarone 200 mg daily.  Did not tolerate 400 mg amiodarone.    Chronic anticoagulation Continue low-dose Eliquis for age and renal function.  No signs of bleeding.   Hypertension Orthostatic hypotension with falls Taking 25 mg hydralazine TID + 2.5 mg amlodipine.  No longer on midodrine.    Chronic diastolic heart failure She received gentle diuresis during her hospitalization but was not discharged on scheduled Lasix. Appears euvolemic on exam.    Prolonged QT Likely due to amiodarone. Avoid medications that prolong QT.    Hyperlipidemia On pravastatin instead of simvastatin due to amiodarone.       Past Medical History:  Diagnosis Date   Arthritis    back  Atrial fibrillation with rapid ventricular response ([place]) 02/2014   a. CHA2DS2VASc = 6 -> on eliquis;  b. 02/2014 s/p DCCV;  c. 08/2014 Echo: EF 55-60%, Gr 2 DD, mild MR, triv AI.   CAD (coronary artery disease)    a. s/p CABG x 2 (LIMA->LAD, VG->Diag);  b. 08/2014 Cath: LM nl, LAD 90p, LCX nl, RCA nl/dominant, LIMA->LAD atretic, VG->D2 patent w/ retrograde filling of LAD, EF 55-60%-->Med Rx.   Diverticulitis 02/21/2012   GERD (gastroesophageal reflux disease)    Hyperlipemia    Hypertension     Insomnia     Past Surgical History:  Procedure Laterality Date   ABDOMINAL HYSTERECTOMY  1975   CARDIAC CATHETERIZATION N/A 05/09/2015   Procedure: Right Heart Cath;  Surgeon: Larey Dresser, MD;  Location: [facility];  Service: Cardiovascular;  Laterality: N/A;   CARDIOVERSION N/A 02/28/2014   Procedure: CARDIOVERSION;  Surgeon: Sanda Klein, MD;  Location: [facility];  Service: Cardiovascular;  Laterality: N/A;   CARDIOVERSION N/A 01/30/2019   Procedure: CARDIOVERSION;  Surgeon: Fay Records, MD;  Location: [facility];  Service: Cardiovascular;  Laterality: N/A;   CATARACT EXTRACTION Bilateral 2014   with lens implanted   COLONOSCOPY N/A 02/19/2015   Procedure: COLONOSCOPY;  Surgeon: Ladene Artist, MD;  Location: [facility] ENDOSCOPY;  Service: Endoscopy;  Laterality: N/A;   CORONARY ARTERY BYPASS GRAFT  2006   off-pump bypass surgery with LIMA to the LAD and SVG to the second diagonal artery ( performed by Dr Servando Snare)    [place] Bilateral    L-2004, R-2006   LEFT HEART CATHETERIZATION WITH CORONARY/GRAFT ANGIOGRAM N/A 09/17/2014   Procedure: LEFT HEART CATHETERIZATION WITH Beatrix Fetters;  Surgeon: Troy Sine, MD;  Location[facility] CATH LAB;  Service: Cardiovascular;  Laterality: N/A;   REIMPLANTATION OF TOTAL KNEE Right 10/08/2014   Procedure: REIMPLANTATION OF RIGHT TOTAL KNEE ARTHROPLASTY WITH REMOVAL OF ANTIBIOTIC SPACER;  Surgeon: Paralee Cancel, MD;  Location: [facility];  Service: Orthopedics;  Laterality: Right;   ROTATOR CUFF REPAIR Bilateral    r-1999, l- 2005   TEE WITHOUT CARDIOVERSION N/A 02/28/2014   Procedure: TRANSESOPHAGEAL ECHOCARDIOGRAM (TEE);  Surgeon: Sanda Klein, MD;  Location: [facility] ENDOSCOPY;  Service: Cardiovascular;  Laterality: N/A;   TEE WITHOUT CARDIOVERSION N/A 01/30/2019   Procedure: TRANSESOPHAGEAL ECHOCARDIOGRAM (TEE);  Surgeon: Fay Records, MD;  Location: [facility] ENDOSCOPY;  Service: Cardiovascular;  Laterality: N/A;   TOTAL KNEE  ARTHROPLASTY Right 07/02/2014   Procedure: Resection of Infected Right Total Knee Arthroplasty with placement antibiotic spacer;  Surgeon: Mauri Pole, MD;  Location: [facility];  Service: Orthopedics;  Laterality: Right;    Current Medications: No outpatient medications have been marked as taking for the 03/05/22 encounter (Appointment) with Ledora Bottcher, [place].     Allergies:   Atorvastatin, Beta adrenergic blockers, Crestor [rosuvastatin], Morphine and related, and Prednisone   Social History   Socioeconomic History   Marital status: Widowed    Spouse name: Not on file   Number of children: 2   Years of education: 10   Highest education level: 10th grade  Occupational History   Not on file  Tobacco Use   Smoking status: Never   Smokeless tobacco: Never  Vaping Use   Vaping Use: Never used  Substance and Sexual Activity   Alcohol use: No   Drug use: No   Sexual activity: Not Currently    Birth control/protection: Post-menopausal  Other Topics Concern   Not on file  Social  History Narrative   Current Social History 05/11/2017           Patient lives alone in one level home    Transportation: Patient has own vehicle and drives herself    Important Relationships Children, grandchildren, great-grandchildren and daughter-in-law are all nearby   Pets: None 05/11/2017   Education / Work:  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 Drinks water and one cup of coffee daily.   Religious / Personal Beliefs: Baptist. "I believe Jesus died on the cross to save me [id number] my sins. I am saved by the grace of God."    Other: "I have a good life and am happy. My family is there when I need help."                                                              Social Determinants of Health   Financial Resource Strain: Low Risk  (07/18/2018)   Overall Financial  Resource Strain (CARDIA)    Difficulty of Paying Living Expenses: Not very hard  Food Insecurity: No Food Insecurity (07/18/2018)   Hunger Vital Sign    Worried About Running Out of Food in the Last Year: Never true    Ran Out of Food in the Last Year: Never true  Transportation Needs: No Transportation Needs (07/18/2018)   PRAPARE - Hydrologist (Medical): No    Lack of Transportation (Non-Medical): No  Physical Activity: Insufficiently Active (07/18/2018)   Exercise Vital Sign    Days of Exercise per Week: 7 days    Minutes of Exercise per Session: 10 min  Stress: No Stress Concern Present (07/18/2018)   Friendly    Feeling of Stress : Not at all  Social Connections: Somewhat Isolated (07/18/2018)   Social Connection and Isolation Panel [NHANES]    Frequency of Communication with Friends and Family: More than three times a week    Frequency of Social Gatherings with Friends and Family: More than three times a week    Attends Religious Services: More than 4 times per year    Active Member of [employer] or Organizations: No    Attends Archivist Meetings: Never    Marital Status: Widowed     Family History: The patient's ***family history includes Arthritis in her brother; Cancer in her brother; Heart attack in her brother; Parkinson's disease in her mother. There is no history of Breast cancer.  ROS:   Please see the history of present illness.    *** All other systems reviewed and are negative.  EKGs/Labs/Other Studies Reviewed:    The following studies were reviewed today: ***  EKG:  EKG is *** ordered today.  The ekg ordered today demonstrates ***  Recent Labs: 10/23/2021: TSH 1.353 10/24/2021: ALT 50; B Natriuretic Peptide 138.7; Magnesium 1.9 02/13/2022: BUN 26; Creatinine, Ser 1.49; Hemoglobin 10.8; Platelets 181; Potassium 4.5; Sodium 136  Recent Lipid Panel     Component Value Date/Time   CHOL 147 10/20/2021 1107   TRIG 139 10/20/2021 1107   HDL 62 10/20/2021 1107   CHOLHDL  2.4 10/20/2021 1107   CHOLHDL 2.8 04/08/2016 1153   VLDL 26 04/08/2016 1153   LDLCALC 61 10/20/2021 1107     Risk Assessment/Calculations:   {Does this patient have ATRIAL FIBRILLATION?[phone]}       Physical Exam:    VS:  There were no vitals taken for this visit.    Wt Readings from Last 3 Encounters:  02/09/22 116 lb (52.6 kg)  02/06/22 119 lb (54 kg)  01/08/22 119 lb 12.8 oz (54.3 kg)     GEN: *** Well nourished, well developed in no acute distress HEENT: Normal NECK: No JVD; No carotid bruits LYMPHATICS: No lymphadenopathy CARDIAC: ***RRR, no murmurs, rubs, gallops RESPIRATORY:  Clear to auscultation without rales, wheezing or rhonchi  ABDOMEN: Soft, non-tender, non-distended MUSCULOSKELETAL:  No edema; No deformity  SKIN: Warm and dry NEUROLOGIC:  Alert and oriented x 3 PSYCHIATRIC:  Normal affect   ASSESSMENT:    No diagnosis found. PLAN:    In order of problems listed above:  ***      {Are you ordering a CV Procedure (e.g. stress test, cath, DCCV, TEE, etc)?   Press F2        :[id number]}    Medication Adjustments/Labs and Tests Ordered: Current medicines are reviewed at length with the patient today.  Concerns regarding medicines are outlined above.  No orders of the defined types were placed in this encounter.  No orders of the defined types were placed in this encounter.   There are no Patient Instructions on file for this visit.   Signed, Ledora Bottcher, [place]  02/24/2022 4:37 PM    [facility]

## 2022-02-26 ENCOUNTER — Ambulatory Visit: Payer: PPO | Admitting: Family Medicine

## 2022-02-27 NOTE — Telephone Encounter (Signed)
Called patient.  "I just had a bad night.  I don't need any more pain medicine called in."  No new Rx for now.  She will call again Monday if she changes her mind.

## 2022-02-27 NOTE — Telephone Encounter (Signed)
Jacqueline [name] nurse case manager calls nurse line in regards to uncontrolled pain.   Jacqueline [name] reports she spoke with patient this morning and patient reported a "very terrible night."   Patient has been taking Tramadol and Tylenol as discussed with provider on 7/27, however reports the nights are the "worst."   Patient is requesting a low dose Vicodin prescription to be taken only at bedtime.   Patient has an apt with PCP on 8/28.  Will forward to PCP.

## 2022-03-02 ENCOUNTER — Telehealth: Payer: Self-pay | Admitting: Family Medicine

## 2022-03-02 MED ORDER — TRAMADOL HCL 50 MG PO TABS: 50.0000 mg | ORAL_TABLET | Freq: Three times a day (TID) | ORAL | 1 refills | Status: DC | PRN

## 2022-03-02 NOTE — Telephone Encounter (Signed)
Pain is adaquately controled on tramadol 50 mg 3x/day.  Rx givenas requested.

## 2022-03-02 NOTE — Telephone Encounter (Signed)
Patient called stating she is needing you to call her. She said she is going to need her pain meds, she said she will be out Wednesday.   Please advise.  Thanks!

## 2022-03-05 ENCOUNTER — Ambulatory Visit: Payer: HMO | Admitting: Physician Assistant

## 2022-03-05 ENCOUNTER — Ambulatory Visit: Payer: Self-pay

## 2022-03-05 NOTE — Patient Outreach (Addendum)
  Care Coordination   Follow Up Visit Note   03/05/2022 Name: Jacqueline [name] MRN: [id number] DOB: Nov 29, 1930  Jacqueline [name] is a 88 y.o. year old female who sees Hensel, Jamal Collin, MD for primary care. I spoke with  [name] by phone today  What matters to the patients health and wellness today?  I have pain in my pelvic area and down my legs.    Goals Addressed               This Visit's Progress     I want to keep my blood pressure at a  normal level (pt-stated)        Care Coordination Interventions: Evaluation of current treatment plan related to hypertension self management and patient's adherence to plan as established by provider Reviewed medications with patient and discussed importance of compliance Discussed plans with patient for ongoing care management follow up and provided patient with direct contact information for care management team Reviewed scheduled/upcoming provider appointments including: 03/23/22 @ 1110 am Solution-Focused Strategies employed: regarding pain in lower back pelvic area down legs.  Patient denies urinary symptoms and had a bowel movement on Tuesday.,  Problem Solving /Task Center strategies reviewed- discussed fall precautions, used heat, tylenol, Home Health Nurse today and PT tomorrow should be coming. Patient stated that her blood pressure was 138/60 yesterday she did not check it today,  She denies any chest pain shortness of breath or headache.         SDOH assessments and interventions completed:  No     Care Coordination Interventions Activated:  Yes  Care Coordination Interventions:  Yes, provided   Follow up plan: Follow up call scheduled for 04/07/22 @ 1015 am    Encounter Outcome:  Pt. Visit Completed   Lazaro Arms RN, BSN, [facility]   Phone: [phone]

## 2022-03-05 NOTE — Patient Instructions (Addendum)
Visit Information  Thank you for taking time to visit with me today. Please don't hesitate to contact me if I can be of assistance to you.   Following are the goals we discussed today:   Goals Addressed               This Visit's Progress     I want to keep my blood pressure at a  normal level (pt-stated)        Care Coordination Interventions: Evaluation of current treatment plan related to hypertension self management and patient's adherence to plan as established by provider Reviewed medications with patient and discussed importance of compliance Discussed plans with patient for ongoing care management follow up and provided patient with direct contact information for care management team Reviewed scheduled/upcoming provider appointments including: 03/23/22 @ 1110 am Solution-Focused Strategies employed: regarding pain in lower back pelvic area down legs.  Patient denies urinary symptoms and had a bowel movement on Tuesday.,  Problem Solving /Task Center strategies reviewed- discussed fall precautions, used heat, tylenol, Home Health Nurse today and PT tomorrow should be coming. Patient stated that her blood pressure was 138/60 yesterday she did not check it today,  She denies any chest pain shortness of breath or headache.         Our next appointment is by telephone on 04/07/22  at 1015 am  Please call the care guide team at [phone] if you need to cancel or reschedule your appointment.   Patient verbalizes understanding of instructions and care plan provided today and agrees to view in [place]. Active MyChart status and patient understanding of how to access instructions and care plan via MyChart confirmed with patient.     Lazaro Arms RN, BSN, [place] [facility]   Phone: [phone]

## 2022-03-23 ENCOUNTER — Encounter: Payer: Self-pay | Admitting: Family Medicine

## 2022-03-23 ENCOUNTER — Ambulatory Visit (INDEPENDENT_AMBULATORY_CARE_PROVIDER_SITE_OTHER): Payer: HMO | Admitting: Family Medicine

## 2022-03-23 DIAGNOSIS — S2241XD Multiple fractures of ribs, right side, subsequent encounter for fracture with routine healing: Secondary | ICD-10-CM

## 2022-03-23 DIAGNOSIS — S32591D Other specified fracture of right pubis, subsequent encounter for fracture with routine healing: Secondary | ICD-10-CM

## 2022-03-23 DIAGNOSIS — I10 Essential (primary) hypertension: Secondary | ICD-10-CM | POA: Diagnosis not present

## 2022-03-23 NOTE — Assessment & Plan Note (Signed)
Still with some groin and ant thigh pain.  She manages.  Also, she has done a great job in regaining her mobility.

## 2022-03-23 NOTE — Progress Notes (Signed)
    SUBJECTIVE:   CHIEF COMPLAINT / HPI:   FU fall [place] one month ago and suffered multiple rib fractures and a public ramus fracture.  Hospitalized for [facility]U and dispo recs.  Went to SNF but only for one night.  She left the next day because staff did not respond to her calls for help. She has done well at home and regained her mobility.  Did get (only 3 sessions?) PT.  No ongoing PT>   Also Hypertension has been difficult with high systolics and low diastolics.  No lightheadedness Lives alone but has family very close by.    OBJECTIVE:   BP 126/69 (BP Location: Left Arm, Cuff Size: Normal)   Pulse (!) 59   Ht '5\' 1"'$  (1.549 m)   Wt 114 lb (51.7 kg)   SpO2 98%   BMI 21.54 kg/m   Lungs clear Cardiac RRR without m or g  ASSESSMENT/PLAN:   Right rib fracture Still with some pain but recovering nicely.  Hypertension Well controled at present.  Inferior pubic ramus fracture ([place]) Still with some groin and ant thigh pain.  She manages.  Also, she has done a great job in regaining her mobility.     [name], MD [place]

## 2022-03-23 NOTE — Assessment & Plan Note (Signed)
Still with some pain but recovering nicely.

## 2022-03-23 NOTE — Assessment & Plan Note (Signed)
Well controled at present.

## 2022-03-23 NOTE — Patient Instructions (Addendum)
I am satisfied with your blood pressure.  The most accurate readings are after you have been sitting quietly for 15 minutes. I would only take the midodrine if the top number is below 120 or if the bottom number is below 60.   Please see me in Oct.

## 2022-03-30 ENCOUNTER — Other Ambulatory Visit: Payer: Self-pay | Admitting: Cardiovascular Disease

## 2022-03-30 ENCOUNTER — Other Ambulatory Visit: Payer: Self-pay | Admitting: Family Medicine

## 2022-03-30 DIAGNOSIS — E039 Hypothyroidism, unspecified: Secondary | ICD-10-CM

## 2022-03-30 DIAGNOSIS — I10 Essential (primary) hypertension: Secondary | ICD-10-CM

## 2022-04-08 DIAGNOSIS — H04123 Dry eye syndrome of bilateral lacrimal glands: Secondary | ICD-10-CM | POA: Diagnosis not present

## 2022-04-08 DIAGNOSIS — Z961 Presence of intraocular lens: Secondary | ICD-10-CM | POA: Diagnosis not present

## 2022-04-08 DIAGNOSIS — H16223 Keratoconjunctivitis sicca, not specified as Sjogren's, bilateral: Secondary | ICD-10-CM | POA: Diagnosis not present

## 2022-04-09 ENCOUNTER — Ambulatory Visit: Payer: Self-pay

## 2022-04-10 NOTE — Patient Outreach (Signed)
  Care Coordination   Follow Up Visit Note   04/09/22 Name: Jacqueline [name] MRN: [id number] DOB: Dec 03, 1930  Jacqueline [name] is a 88 y.o. year old female who sees Hensel, Jamal Collin, MD for primary care. I spoke with  Jacqueline [name] by phone today.  What matters to the patients health and wellness today?  I feel good today.    Goals Addressed               This Visit's Progress     I want to keep my blood pressure at a  normal level (pt-stated)        Care Coordination Interventions: Evaluation of current treatment plan related to hypertension self management and patient's adherence to plan as established by provider Reviewed medications with patient and discussed importance of compliance Discussed plans with patient for ongoing care management follow up and provided patient with direct contact information for care management team Reviewed scheduled/upcoming provider appointments including: 03/23/22 @ 1110 am Solution-Focused Strategies employed: regarding pain in lower back pelvic area down legs.  Patient denies urinary symptoms and had a bowel movement on Tuesday.,  Problem Solving /Task Center strategies reviewed- discussed fall precautions, used heat, tylenol,  Jacqueline [name] shared that she had a good day. She went to [facility] to get her shingles shot and buy some groceries, and as a result, felt a little tired. However, she had no chest pain, headache, or flushing symptoms, and her blood pressure was stable at 118/60. I advised her to continue her regimen.        SDOH assessments and interventions completed:  No     Care Coordination Interventions Activated:  Yes  Care Coordination Interventions:  Yes, provided   Follow up plan: Follow up call scheduled for 10/17 10 am    Encounter Outcome:  Pt. Visit Completed

## 2022-04-10 NOTE — Patient Instructions (Signed)
Visit Information  Thank you for taking time to visit with me today. Please don't hesitate to contact me if I can be of assistance to you.   Following are the goals we discussed today:   Goals Addressed               This Visit's Progress     I want to keep my blood pressure at a  normal level (pt-stated)        Care Coordination Interventions: Evaluation of current treatment plan related to hypertension self management and patient's adherence to plan as established by provider Reviewed medications with patient and discussed importance of compliance Discussed plans with patient for ongoing care management follow up and provided patient with direct contact information for care management team Reviewed scheduled/upcoming provider appointments including: 03/23/22 @ 1110 am Solution-Focused Strategies employed: regarding pain in lower back pelvic area down legs.  Patient denies urinary symptoms and had a bowel movement on Tuesday.,  Problem Solving /Task Center strategies reviewed- discussed fall precautions, used heat, tylenol,  Mrs. Szabo shared that she had a good day. [facility] went [facility] to get her [facility]ingles shot and buy some groceries, and as a result, felt a little tired. However, she had no chest pain, headache, or flushing symptoms, and her blood pressure was stable at 118/60. I advised her to continue her regimen.        Our next appointment is by telephone on 10/17 at 10 am  Please call the care guide team at [phone] if you need to cancel or reschedule your appointment.   If you are experiencing a Mental Health or [place] or need someone to talk to, please call 1-800-273-TALK (toll free, 24 hour hotline)  Patient verbalizes understanding of instructions and care plan provided today and agrees to view in [place]. Active MyChart status and patient understanding of how to access instructions and care plan via MyChart confirmed with patient.     Lazaro Arms RN,  BSN, [place] [facility]   Phone: [phone]

## 2022-05-07 ENCOUNTER — Ambulatory Visit (INDEPENDENT_AMBULATORY_CARE_PROVIDER_SITE_OTHER): Payer: HMO | Admitting: Family Medicine

## 2022-05-07 ENCOUNTER — Encounter: Payer: Self-pay | Admitting: Family Medicine

## 2022-05-07 VITALS — BP 158/64 | HR 71 | Wt 115.4 lb

## 2022-05-07 DIAGNOSIS — E039 Hypothyroidism, unspecified: Secondary | ICD-10-CM

## 2022-05-07 DIAGNOSIS — I5032 Chronic diastolic (congestive) heart failure: Secondary | ICD-10-CM | POA: Diagnosis not present

## 2022-05-07 DIAGNOSIS — I480 Paroxysmal atrial fibrillation: Secondary | ICD-10-CM

## 2022-05-07 DIAGNOSIS — I10 Essential (primary) hypertension: Secondary | ICD-10-CM | POA: Diagnosis not present

## 2022-05-07 DIAGNOSIS — Z23 Encounter for immunization: Secondary | ICD-10-CM

## 2022-05-07 DIAGNOSIS — R531 Weakness: Secondary | ICD-10-CM

## 2022-05-07 NOTE — Assessment & Plan Note (Signed)
At goal for her.  Check BMP for lytes and renal function.

## 2022-05-07 NOTE — Assessment & Plan Note (Signed)
Check CBC since on DOAC.

## 2022-05-07 NOTE — Progress Notes (Signed)
    SUBJECTIVE:   CHIEF COMPLAINT / HPI:   "I feel terribly."  C/O being weak and trembling for one week.  She has more longstanding legs aching at night.  Feet are numb and cold.  These leg sx are stable.  Although she checked all 3s on her PHQ-9, "I am not depressed."  Denies change in bowel, bladder, appetite or sleep.  She has occasional palpitations which are longstanding with her PAF.  No bleeding (on DOAC).    Appetite waxes and wanes.  Denies CP or SOB.  No new meds, prescription or OTC.  No symptoms that point to any organ system.    OBJECTIVE:   BP (!) 158/64   Pulse 71   Wt 115 lb 6.4 oz (52.3 kg)   SpO2 96%   BMI 21.80 kg/m   BP good for her.  Weight is in her recent normal range. Lungs clear Cardiac RRR without m or g Abd benign Ext no edema   ASSESSMENT/PLAN:   Hypertension At goal for her.  Check BMP for lytes and renal function.  Paroxysmal atrial fibrillation (HCC) Check CBC since on DOAC.  Hypothyroidism Check TSH.  Weakness generalized Long differential.  Will do staged work up.  First order tests done today.  Include sed rate for PMR.  FU two weeks to monitor progression of disease.    Chronic diastolic CHF (congestive heart failure) (HCC) No evidence of volume overload.       [name], MD [place]

## 2022-05-07 NOTE — Assessment & Plan Note (Signed)
No evidence of volume overload. 

## 2022-05-07 NOTE — Assessment & Plan Note (Signed)
Check TSH 

## 2022-05-07 NOTE — Assessment & Plan Note (Signed)
Long differential.  Will do staged work up.  First order tests done today.  Include sed rate for PMR.  FU two weeks to monitor progression of disease.

## 2022-05-07 NOTE — Patient Instructions (Signed)
I will call Monday with lab test results. Someone will check the labs tomorrow and let you know if something big is wrong. If the labs don't point me to a treatment, follow up will be key.  If you perk back up, I will quit worrying.  If you are getting worse, I will need to do more tests. Please make an appointment with me in about 2 weeks.

## 2022-05-08 LAB — CMP14+EGFR
ALT: 12 IU/L (ref 0–32)
AST: 28 IU/L (ref 0–40)
Albumin/Globulin Ratio: 1.5 (ref 1.2–2.2)
Albumin: 4.1 g/dL (ref 3.7–4.7)
Alkaline Phosphatase: 138 IU/L — ABNORMAL HIGH (ref 44–121)
BUN/Creatinine Ratio: 19 (ref 12–28)
BUN: 21 mg/dL (ref 8–27)
Bilirubin Total: 0.3 mg/dL (ref 0.0–1.2)
CO2: 22 mmol/L (ref 20–29)
Calcium: 9.2 mg/dL (ref 8.7–10.3)
Chloride: 103 mmol/L (ref 96–106)
Creatinine, Ser: 1.1 mg/dL — ABNORMAL HIGH (ref 0.57–1.00)
Globulin, Total: 2.8 g/dL (ref 1.5–4.5)
Glucose: 87 mg/dL (ref 70–99)
Potassium: 4.4 mmol/L (ref 3.5–5.2)
Sodium: 139 mmol/L (ref 134–144)
Total Protein: 6.9 g/dL (ref 6.0–8.5)
eGFR: 48 mL/min/{1.73_m2} — ABNORMAL LOW (ref >59)

## 2022-05-08 LAB — CBC
Hematocrit: 33.5 % — ABNORMAL LOW (ref 34.0–46.6)
Hemoglobin: 11 g/dL — ABNORMAL LOW (ref 11.1–15.9)
MCH: 31.6 pg (ref 26.6–33.0)
MCHC: 32.8 g/dL (ref 31.5–35.7)
MCV: 96 fL (ref 79–97)
Platelets: 306 10*3/uL (ref 150–450)
RBC: 3.48 x10E6/uL — ABNORMAL LOW (ref 3.77–5.28)
RDW: 13.1 % (ref 11.7–15.4)
WBC: 4.7 10*3/uL (ref 3.4–10.8)

## 2022-05-08 LAB — SEDIMENTATION RATE: Sed Rate: 28 mm/hr (ref 0–40)

## 2022-05-08 LAB — BRAIN NATRIURETIC PEPTIDE: BNP: 192.3 pg/mL — ABNORMAL HIGH (ref 0.0–100.0)

## 2022-05-08 LAB — TSH: TSH: 0.827 u[IU]/mL (ref 0.450–4.500)

## 2022-05-11 ENCOUNTER — Telehealth: Payer: Self-pay | Admitting: Family Medicine

## 2022-05-11 NOTE — Telephone Encounter (Signed)
Called..  Informed labs are reassuring.  She still feels generally tired but no new symptoms.  She has follow up appointment with me in two weeks.

## 2022-05-12 ENCOUNTER — Ambulatory Visit: Payer: Self-pay

## 2022-05-12 NOTE — Patient Instructions (Signed)
Visit Information  Thank you for taking time to visit with me today. Please don't hesitate to contact me if I can be of assistance to you.   Following are the goals we discussed today:   Goals Addressed               This Visit's Progress     I want to keep my blood pressure at a  normal level (pt-stated)        Care Coordination Interventions: Evaluation of current treatment plan related to hypertension self management and patient's adherence to plan as established by provider Reviewed medications with patient and discussed importance of compliance Discussed plans with patient for ongoing care management follow up and provided patient with direct contact information for care management team Reviewed scheduled/upcoming provider appointments including: 03/23/22 @ 1110 am Solution-Focused Strategies employed: regarding pain in lower back pelvic area down legs.  Patient denies urinary symptoms and had a bowel movement on Tuesday.,  Problem Solving /Task Center strategies reviewed- discussed fall precautions, used heat, tylenol,  Jacqueline [name] is currently doing well, with no complaints of headaches or chest pain. Her blood pressure reading this morning was 125/68, while her pulse rate was 71. Yesterday, her blood pressure was 155/87, with a pulse rate of 72. She is sleeping well at night and taking her medications as prescribed. I advised her to continue with her medication regimen, but I also reminded her to be extra careful while going out due to the change in weather and the cases of COVID-19. I advised her to take all the necessary precautions to protect herself from getting infected.        Our next appointment is by telephone on 11/17 at 10 am  Please call the care guide team at [phone] if you need to cancel or reschedule your appointment.   If you are experiencing a Mental Health or [place] or need someone to talk to, please call 1-800-273-TALK (toll free, 24 hour  hotline)  Patient verbalizes understanding of instructions and care plan provided today and agrees to view in [place]. Active MyChart status and patient understanding of how to access instructions and care plan via MyChart confirmed with patient.     Lazaro Arms RN, BSN, [facility]   Phone: [phone]

## 2022-05-12 NOTE — Patient Outreach (Signed)
  Care Coordination   Follow Up Visit Note   05/12/2022 Name: Jacqueline [name] MRN: [id number] DOB: [birth year]  Jacqueline [name] is a 88 y.o. year old female who sees Hensel, Jamal Collin, MD for primary care. I spoke with  Jacqueline [name] by phone today.  What matters to the patients health and wellness today?  I feel good.    Goals Addressed               This Visit's Progress     I want to keep my blood pressure at a  normal level (pt-stated)        Care Coordination Interventions: Evaluation of current treatment plan related to hypertension self management and patient's adherence to plan as established by provider Reviewed medications with patient and discussed importance of compliance Discussed plans with patient for ongoing care management follow up and provided patient with direct contact information for care management team Reviewed scheduled/upcoming provider appointments including: 03/23/22 @ 1110 am Solution-Focused Strategies employed: regarding pain in lower back pelvic area down legs.  Patient denies urinary symptoms and had a bowel movement on Tuesday.,  Problem Solving /Task Center strategies reviewed- discussed fall precautions, used heat, tylenol,  Jacqueline [name] is currently doing well, with no complaints of headaches or chest pain. Her blood pressure reading this morning was 125/68, while her pulse rate was 71. Yesterday, her blood pressure was 155/87, with a pulse rate of 72. She is sleeping well at night and taking her medications as prescribed. I advised her to continue with her medication regimen, but I also reminded her to be extra careful while going out due to the change in weather and the cases of COVID-19. I advised her to take all the necessary precautions to protect herself from getting infected.        SDOH assessments and interventions completed:  No     Care Coordination Interventions Activated:  Yes  Care Coordination Interventions:  Yes, provided     Follow up plan: Follow up call scheduled for 11/17 10 am  Encounter Outcome:  Pt. Visit Completed   Lazaro Arms RN, BSN, [facility]   Phone: [phone]

## 2022-05-14 ENCOUNTER — Other Ambulatory Visit: Payer: Self-pay | Admitting: Family Medicine

## 2022-05-14 DIAGNOSIS — I10 Essential (primary) hypertension: Secondary | ICD-10-CM | POA: Diagnosis not present

## 2022-05-14 DIAGNOSIS — Z515 Encounter for palliative care: Secondary | ICD-10-CM | POA: Diagnosis not present

## 2022-05-14 DIAGNOSIS — I480 Paroxysmal atrial fibrillation: Secondary | ICD-10-CM

## 2022-05-21 ENCOUNTER — Ambulatory Visit: Payer: HMO | Admitting: Family Medicine

## 2022-05-25 ENCOUNTER — Ambulatory Visit (INDEPENDENT_AMBULATORY_CARE_PROVIDER_SITE_OTHER): Payer: HMO | Admitting: Family Medicine

## 2022-05-25 ENCOUNTER — Encounter: Payer: Self-pay | Admitting: Family Medicine

## 2022-05-25 DIAGNOSIS — R531 Weakness: Secondary | ICD-10-CM | POA: Diagnosis not present

## 2022-05-25 DIAGNOSIS — I10 Essential (primary) hypertension: Secondary | ICD-10-CM

## 2022-05-25 NOTE — Assessment & Plan Note (Signed)
Good control.  No change.

## 2022-05-25 NOTE — Progress Notes (Signed)
    SUBJECTIVE:   CHIEF COMPLAINT / HPI:   FU hypertension, fall, pelvic fracture.  Doing well!  Still with minor pelvic discomfort - her back hurts her more.  Her activity level has increased to her pre fall state.  She is now more cautious at home and uses a walker regularly.    BP has also been a challenge with high systolics and low diastolics.  Home BP record reading mostly at goal.  BP with [place] today is good. No lightheaded spells.  Appetitie is picking up     OBJECTIVE:   BP 132/60   Pulse 61   Ht '5\' 1"'$  (1.549 m)   Wt 115 lb (52.2 kg)   SpO2 97%   BMI 21.73 kg/m   Lungs clear Cardiac RRR without m or g Ext no edema  ASSESSMENT/PLAN:   Weakness generalized Improving.  Slowly regaining strength.    Hypertension Good control.  No change.     [name], MD [place]

## 2022-05-25 NOTE — Patient Instructions (Signed)
I am delighted that you are feeling better. I think it would be a good idea RSV vaccine See me one more time in January for a quick check.

## 2022-05-25 NOTE — Assessment & Plan Note (Signed)
Improving.  Slowly regaining strength.

## 2022-05-26 ENCOUNTER — Other Ambulatory Visit: Payer: Self-pay | Admitting: Cardiovascular Disease

## 2022-05-29 ENCOUNTER — Other Ambulatory Visit: Payer: Self-pay | Admitting: Family Medicine

## 2022-05-29 DIAGNOSIS — G609 Hereditary and idiopathic neuropathy, unspecified: Secondary | ICD-10-CM

## 2022-06-03 ENCOUNTER — Encounter: Payer: Self-pay | Admitting: *Deleted

## 2022-06-07 ENCOUNTER — Other Ambulatory Visit: Payer: Self-pay | Admitting: Family Medicine

## 2022-06-07 DIAGNOSIS — I480 Paroxysmal atrial fibrillation: Secondary | ICD-10-CM

## 2022-06-11 ENCOUNTER — Ambulatory Visit: Payer: Self-pay

## 2022-06-11 NOTE — Patient Outreach (Signed)
  Care Coordination   Follow Up Visit Note   06/11/2022 Name: Jacqueline [name] MRN: [id number] DOB: 1931/03/30  Jacqueline [name] is a 88 y.o. year old female who sees Hensel, Jamal Collin, MD for primary care. I spoke with  Jacqueline [name] by phone today.  What matters to the patients health and wellness today?  I feel great    Goals Addressed               This Visit's Progress     I want to keep my blood pressure at a  normal level (pt-stated)        Care Coordination Interventions: Evaluation of current treatment plan related to hypertension self management and patient's adherence to plan as established by provider Reviewed medications with patient and discussed importance of compliance Discussed plans with patient for ongoing care management follow up and provided patient with direct contact information for care management team Reviewed scheduled/upcoming provider appointments including: 03/23/22 @ 1110 am Solution-Focused Strategies employed: regarding pain in lower back pelvic area down legs.  Patient denies urinary symptoms and had a bowel movement on Tuesday.,  Problem Solving /Task Center strategies reviewed- discussed fall precautions, used heat, tylenol,  During our conversation, Jacqueline [name] informed me that she has been feeling great lately. She mentioned that she has been adhering to her medication regimen and monitoring her blood pressure levels daily, which have consistently ranged between 132/71 and 137/78. Despite not being certain about the cause of this positive change, she expressed her hope that it will continue. Jacqueline [name] requested that we schedule follow-up appointments every two months, unless she requires my assistance beforehand.         SDOH assessments and interventions completed:  No     Care Coordination Interventions Activated:  Yes  Care Coordination Interventions:  Yes, provided   Follow up plan: Follow up call scheduled for 1/16   1030 am     Encounter Outcome:  Pt. Visit Completed   Lazaro Arms RN, BSN, [facility]   Phone: [phone]

## 2022-06-11 NOTE — Patient Instructions (Signed)
Visit Information  Thank you for taking time to visit with me today. Please don't hesitate to contact me if I can be of assistance to you.   Following are the goals we discussed today:   Goals Addressed               This Visit's Progress     I want to keep my blood pressure at a  normal level (pt-stated)        Care Coordination Interventions: Evaluation of current treatment plan related to hypertension self management and patient's adherence to plan as established by provider Reviewed medications with patient and discussed importance of compliance Discussed plans with patient for ongoing care management follow up and provided patient with direct contact information for care management team Reviewed scheduled/upcoming provider appointments including: 03/23/22 @ 1110 am Solution-Focused Strategies employed: regarding pain in lower back pelvic area down legs.  Patient denies urinary symptoms and had a bowel movement on Tuesday.,  Problem Solving /Task Center strategies reviewed- discussed fall precautions, used heat, tylenol,  During our conversation, Mrs. Lantry informed me that she has been feeling great lately. She mentioned that she has been adhering to her medication regimen and monitoring her blood pressure levels daily, which have consistently ranged between 132/71 and 137/78. Despite not being certain about the cause of this positive change, she expressed her hope that it will continue. Mrs. Isaacs requested that we schedule follow-up appointments every two months, unless she requires my assistance beforehand.         Our next appointment is by telephone on 1/16 at 1030 am  Please call the care guide team at [phone] if you need to cancel or reschedule your appointment.   If you are experiencing a Mental Health or [place] or need someone to talk to, please call 1-800-273-TALK (toll free, 24 hour hotline)  Patient verbalizes understanding of instructions and  care plan provided today and agrees to view in [place]. Active MyChart status and patient understanding of how to access instructions and care plan via MyChart confirmed with patient.     Lazaro Arms RN, BSN, [facility]   Phone: [phone]

## 2022-06-25 ENCOUNTER — Other Ambulatory Visit: Payer: Self-pay | Admitting: Family Medicine

## 2022-06-26 ENCOUNTER — Other Ambulatory Visit: Payer: Self-pay | Admitting: Family Medicine

## 2022-06-26 DIAGNOSIS — F4329 Adjustment disorder with other symptoms: Secondary | ICD-10-CM

## 2022-07-14 ENCOUNTER — Encounter: Payer: Self-pay | Admitting: Cardiovascular Disease

## 2022-07-14 ENCOUNTER — Ambulatory Visit: Payer: HMO | Attending: Cardiovascular Disease | Admitting: Cardiovascular Disease

## 2022-07-14 VITALS — BP 166/74 | HR 65 | Ht 61.0 in | Wt 116.2 lb

## 2022-07-14 DIAGNOSIS — I480 Paroxysmal atrial fibrillation: Secondary | ICD-10-CM | POA: Diagnosis not present

## 2022-07-14 DIAGNOSIS — D6869 Other thrombophilia: Secondary | ICD-10-CM

## 2022-07-14 DIAGNOSIS — E7800 Pure hypercholesterolemia, unspecified: Secondary | ICD-10-CM

## 2022-07-14 DIAGNOSIS — I951 Orthostatic hypotension: Secondary | ICD-10-CM

## 2022-07-14 DIAGNOSIS — N1831 Chronic kidney disease, stage 3a: Secondary | ICD-10-CM

## 2022-07-14 DIAGNOSIS — I129 Hypertensive chronic kidney disease with stage 1 through stage 4 chronic kidney disease, or unspecified chronic kidney disease: Secondary | ICD-10-CM

## 2022-07-14 DIAGNOSIS — I10 Essential (primary) hypertension: Secondary | ICD-10-CM

## 2022-07-14 DIAGNOSIS — Z79899 Other long term (current) drug therapy: Secondary | ICD-10-CM

## 2022-07-14 DIAGNOSIS — R9431 Abnormal electrocardiogram [ECG] [EKG]: Secondary | ICD-10-CM

## 2022-07-14 DIAGNOSIS — Z5181 Encounter for therapeutic drug level monitoring: Secondary | ICD-10-CM | POA: Diagnosis not present

## 2022-07-14 DIAGNOSIS — I5032 Chronic diastolic (congestive) heart failure: Secondary | ICD-10-CM

## 2022-07-14 DIAGNOSIS — I2721 Secondary pulmonary arterial hypertension: Secondary | ICD-10-CM

## 2022-07-14 DIAGNOSIS — I25728 Atherosclerosis of autologous artery coronary artery bypass graft(s) with other forms of angina pectoris: Secondary | ICD-10-CM

## 2022-07-14 NOTE — Patient Instructions (Signed)
Medication Instructions:  The current medical regimen is effective;  continue present plan and medications.  *If you need a refill on your cardiac medications before your next appointment, please call your pharmacy*   Follow-Up: At [facility], you and your health needs are our priority.  As part of our continuing mission to provide you with exceptional heart care, we have created designated Provider Care Teams.  These Care Teams include your primary Cardiologist (physician) and Advanced Practice Providers (APPs -  Physician Assistants and Nurse Practitioners) who all work together to provide you with the care you need, when you need it.  We recommend signing up for the patient portal called "MyChart".  Sign up information is provided on this After Visit Summary.  MyChart is used to connect with patients for Virtual Visits (Telemedicine).  Patients are able to view lab/test results, encounter notes, upcoming appointments, etc.  Non-urgent messages can be sent to your provider as well.   To learn more about what you can do with MyChart, go to [email or web address].    Your next appointment:   6 month(s)  The format for your next appointment:   In Person  Provider:   Sanda Klein, MD

## 2022-07-14 NOTE — Progress Notes (Signed)
Cardiology Office Note    Date:  07/15/2022   ID:  [name], [place] 1931/05/22, MRN [id number]  PCP:  [name], MD  Cardiologist:  Hopelynn Gartland Electrophysiologist:  None   Evaluation Performed:  Follow-Up Visit  Chief Complaint:  F/U AFib  History of Present Illness:    Jacqueline [name] is a 88 y.o. female with coronary artery disease and previous bypass surgery (2006), paroxysmal atrial fibrillation requiring previous cardioversion, diastolic heart failure, HTN.  She has not had serious health problems since her episodes of shingles in April.  Her weight loss has abated and she has a steady BMI around 22.  She has not had palpitations, shortness of breath, dizziness or syncope or new falls.  Does not have lower extremity edema and denies orthopnea or PND.  Has not had chest pain.  She keeps a detailed log of her blood pressure which is consistently in the 130s-160s/60s-70s, with heart rates typically in the high 50s to low 60s.  In the past, attempts to aggressively lower her blood pressure have led to syncope and falls and at one point she was actually taking midodrine for hypotension.  She is currently doing well on hydralazine as monotherapy.  She is on amiodarone which has done a very good job of preventing atrial fibrillation and has not caused any side effects to date.  She does have some vision problems which are apparently related to dry eye (she has had lacrimal duct plugs and uses a variety of eyedrops including Restasis and LiquiTears 4 times daily).  I asked her to make sure that her ophthalmologist knows that she is on amiodarone.  She had a protracted episode of persistent atrial fibrillation in summer 2020 on a low-dose of amiodarone.  The dose of amiodarone was increased back to 200 mg daily but before she could undergo cardioversion she presented with rectal bleeding due to hemorrhoids and diverticulosis which required an interruption in her anticoagulants.   She eventually underwent TEE guided cardioversion on July 6.  She had transient atrial fibrillation during urinary tract infection in October 2021.    Past Medical History:  Diagnosis Date   Arthritis    back   Atrial fibrillation with rapid ventricular response ([place]) 02/2014   a. CHA2DS2VASc = 6 -> on eliquis;  b. 02/2014 s/p DCCV;  c. 08/2014 Echo: EF 55-60%, Gr 2 DD, mild MR, triv AI.   CAD (coronary artery disease)    a. s/p CABG x 2 (LIMA->LAD, VG->Diag);  b. 08/2014 Cath: LM nl, LAD 90p, LCX nl, RCA nl/dominant, LIMA->LAD atretic, VG->D2 patent w/ retrograde filling of LAD, EF 55-60%-->Med Rx.   Diverticulitis 02/21/2012   GERD (gastroesophageal reflux disease)    Hyperlipemia    Hypertension    Insomnia    Past Surgical History:  Procedure Laterality Date   ABDOMINAL HYSTERECTOMY  1975   CARDIAC CATHETERIZATION N/A 05/09/2015   Procedure: Right Heart Cath;  Surgeon: Larey Dresser, MD;  Location: [place] [facility];  Service: Cardiovascular;  Laterality: N/A;   CARDIOVERSION N/A 02/28/2014   Procedure: CARDIOVERSION;  Surgeon: Sanda Klein, MD;  Location: [facility] ENDOSCOPY;  Service: Cardiovascular;  Laterality: N/A;   CARDIOVERSION N/A 01/30/2019   Procedure: CARDIOVERSION;  Surgeon: Fay Records, MD;  Location: [facility] ENDOSCOPY;  Service: Cardiovascular;  Laterality: N/A;   CATARACT EXTRACTION Bilateral 2014   with lens implanted   COLONOSCOPY N/A 02/19/2015   Procedure: COLONOSCOPY;  Surgeon: Ladene Artist, MD;  Location: [facility]  ENDOSCOPY;  Service: Endoscopy;  Laterality: N/A;   CORONARY ARTERY BYPASS GRAFT  2006   off-pump bypass surgery with LIMA to the LAD and SVG to the second diagonal artery ( performed by Dr Servando Snare)    [place] Bilateral    L-2004, R-2006   LEFT HEART CATHETERIZATION WITH CORONARY/GRAFT ANGIOGRAM N/A 09/17/2014   Procedure: LEFT HEART CATHETERIZATION WITH Beatrix Fetters;  Surgeon: Troy Sine, MD;  Location: [facility] CATH LAB;  Service:  Cardiovascular;  Laterality: N/A;   REIMPLANTATION OF TOTAL KNEE Right 10/08/2014   Procedure: REIMPLANTATION OF RIGHT TOTAL KNEE ARTHROPLASTY WITH REMOVAL OF ANTIBIOTIC SPACER;  Surgeon: Paralee Cancel, MD;  Location: [facility] ORS;  Service: Orthopedics;  Laterality: Right;   ROTATOR CUFF REPAIR Bilateral    r-1999, l- 2005   TEE WITHOUT CARDIOVERSION N/A 02/28/2014   Procedure: TRANSESOPHAGEAL ECHOCARDIOGRAM (TEE);  Surgeon: Sanda Klein, MD;  Location: [facility] ENDOSCOPY;  Service: Cardiovascular;  Laterality: N/A;   TEE WITHOUT CARDIOVERSION N/A 01/30/2019   Procedure: TRANSESOPHAGEAL ECHOCARDIOGRAM (TEE);  Surgeon: Fay Records, MD;  Location: [facility] ENDOSCOPY;  Service: Cardiovascular;  Laterality: N/A;   TOTAL KNEE ARTHROPLASTY Right 07/02/2014   Procedure: Resection of Infected Right Total Knee Arthroplasty with placement antibiotic spacer;  Surgeon: Mauri Pole, MD;  Location: [facility] ORS;  Service: Orthopedics;  Laterality: Right;     Current Meds  Medication Sig   acetaminophen (TYLENOL) 500 MG tablet Take 1,000 mg by mouth every 6 (six) hours as needed for moderate pain or headache (pain/headache.).   ALPRAZolam (XANAX) 0.5 MG tablet TAKE 1 TABLET BY MOUTH AT BEDTIME.   amiodarone (PACERONE) 200 MG tablet TAKE 1 TABLET BY MOUTH EVERY DAY   apixaban (ELIQUIS) 2.5 MG TABS tablet Take 1 tablet (2.5 mg total) by mouth 2 (two) times daily.   cholecalciferol (VITAMIN D3) 25 MCG (1000 UNIT) tablet Take 1,000 Units by mouth daily.   cycloSPORINE (RESTASIS) 0.05 % ophthalmic emulsion Place 1 drop into both eyes 2 (two) times daily.   gabapentin (NEURONTIN) 100 MG capsule TAKE 1 CAPSULE (100 MG TOTAL) BY MOUTH THREE TIMES DAILY.   hydrALAZINE (APRESOLINE) 25 MG tablet TAKE 1 AND 1/2 TABLETS (37.5 MG) BY MOUTH 3 TIMES DAILY   levothyroxine (SYNTHROID) 75 MCG tablet TAKE 1 TABLET BY MOUTH EVERY DAY   Multiple Vitamin (MULTIVITAMIN WITH MINERALS) TABS tablet Take 1 tablet by mouth daily.   nitroGLYCERIN (NITROSTAT)  0.4 MG SL tablet Place 1 tablet (0.4 mg total) under the tongue every 5 (five) minutes as needed for chest pain.   Omega-3 Fatty Acids (FISH OIL) 1000 MG CAPS Take 1,000 mg by mouth every evening.   omeprazole (PRILOSEC) 20 MG capsule TAKE 1 CAPSULE BY MOUTH EVERY DAY   Polyethyl Glycol-Propyl Glycol (LUBRICANT EYE DROPS) 0.4-0.3 % SOLN Place 1 drop into both eyes 4 (four) times daily.   pravastatin (PRAVACHOL) 40 MG tablet TAKE 1 TABLET BY MOUTH EVERY DAY IN THE EVENING   rOPINIRole (REQUIP) 0.25 MG tablet TAKE 1 TABLET BY MOUTH THREE TIMES A DAY   traMADol (ULTRAM) 50 MG tablet Take 1 tablet (50 mg total) by mouth every 8 (eight) hours as needed.   traZODone (DESYREL) 100 MG tablet TAKE 1 TABLET BY MOUTH EVERYDAY AT BEDTIME (Patient taking differently: Take 100 mg by mouth at bedtime.)     Allergies:   Atorvastatin, Beta adrenergic blockers, Crestor [rosuvastatin], Morphine and related, and Prednisone   Social History   Tobacco Use   Smoking status: Never  Smokeless tobacco: Never  Vaping Use   Vaping Use: Never used  Substance Use Topics   Alcohol use: No   Drug use: No     Family Hx: The patient's family history includes Arthritis in her brother; Cancer in her brother; Heart attack in her brother; Parkinson's disease in her mother. There is no history of Breast cancer.  ROS:   Please see the history of present illness.    All other systems are reviewed and are negative.  Prior CV studies:   The following studies were reviewed today:  Notes from visit with Dr. Andria Frames on 06/10/2020  Echocardiogram 04/23/2020   1. Left ventricular ejection fraction, by estimation, is 55 to 60%. The left ventricle has normal function. The left ventricle has no regional wall motion abnormalities. Left ventricular diastolic parameters are consistent with Grade III diastolic dysfunction (restrictive). Elevated left atrial pressure.   2. Right ventricular systolic function is normal. The right  ventricular size is normal. There is mildly elevated pulmonary artery systolic pressure.   3. Left atrial size was moderately dilated.   4. The mitral valve is normal in structure. Mild mitral valve regurgitation. No evidence of mitral stenosis.   5. The aortic valve is tricuspid. Aortic valve regurgitation is trivial. Mild aortic valve sclerosis is present, with no evidence of aortic valve stenosis.   6. The inferior vena cava is normal in size with greater than 50% respiratory variability, suggesting right atrial pressure of 3 mmHg.  Addendum: On my review the diastolic function does not really qualify for restrictive filling, but rather for pseudonormal filling. [facility])     Labs/Other Tests and Data Reviewed:    EKG: Ordered today and personally reviewed, shows normal sinus rhythm and is a completely normal tracing.  QTc 445 ms.  Recent Labs: 10/24/2021: Magnesium 1.9 05/07/2022: ALT 12; BNP 192.3; BUN 21; Creatinine, Ser 1.10; Hemoglobin 11.0; Platelets 306; Potassium 4.4; Sodium 139; TSH 0.827   Recent Lipid Panel Lab Results  Component Value Date/Time   CHOL 147 10/20/2021 11:07 AM   TRIG 139 10/20/2021 11:07 AM   HDL 62 10/20/2021 11:07 AM   CHOLHDL 2.4 10/20/2021 11:07 AM   CHOLHDL 2.8 04/08/2016 11:53 AM   LDLCALC 61 10/20/2021 11:07 AM    Wt Readings from Last 3 Encounters:  07/14/22 116 lb 3.2 oz (52.7 kg)  05/25/22 115 lb (52.2 kg)  05/07/22 115 lb 6.4 oz (52.3 kg)     Objective:    Vital Signs:  BP (!) 166/74 (BP Location: Left Arm, Patient Position: Sitting, Cuff Size: Normal)   Pulse 65   Ht '5\' 1"'$  (1.549 m)   Wt 116 lb 3.2 oz (52.7 kg)   SpO2 95%   BMI 21.96 kg/m      General: Alert, oriented x3, no distress, appears elderly and frail. Head: no evidence of trauma, PERRL, EOMI, no exophtalmos or lid lag, no myxedema, no xanthelasma; normal ears, nose and oropharynx Neck: normal jugular venous pulsations and no hepatojugular reflux; brisk carotid pulses  without delay and no carotid bruits Chest: clear to auscultation, no signs of consolidation by percussion or palpation, normal fremitus, symmetrical and full respiratory excursions Cardiovascular: normal position and quality of the apical impulse, regular rhythm, normal first and second heart sounds, no murmurs, rubs or gallops Abdomen: no tenderness or distention, no masses by palpation, no abnormal pulsatility or arterial bruits, normal bowel sounds, no hepatosplenomegaly Extremities: no clubbing, cyanosis or edema; 2+ radial, ulnar and brachial pulses bilaterally; 2+  right femoral, posterior tibial and dorsalis pedis pulses; 2+ left femoral, posterior tibial and dorsalis pedis pulses; no subclavian or femoral bruits Neurological: grossly nonfocal Psych: Normal mood and affect     ASSESSMENT & PLAN:    1. Paroxysmal atrial fibrillation (HCC)   2. Encounter for monitoring amiodarone therapy   3. Acquired thrombophilia ([place])   4. Coronary artery disease of autologous bypass graft with stable angina pectoris ([place])   5. Chronic diastolic CHF (congestive heart failure) ([place])   6. PAH (pulmonary artery hypertension) ([place])   7. Hypertensive kidney disease with stage 3a chronic kidney disease ([place])   8. Hypercholesterolemia   9. Orthostatic hypotension   10. Essential hypertension   11. Long QT interval        AFib/flutter: At least from a symptomatic point of view, amiodarone has completely abolished her arrhythmia at least for the time being.  She had a lengthy symptomatic episode in 2020 coinciding with GI bleeding, recurrent symptomatic episode of urinary tract infection in 2021.  Maintaining sinus rhythm on amiodarone.   CHADSVasc 6 (age 16, gender, CAD, CHF, HTN).  On anticoagulation. Amiodarone: Doing well on the 200 mg daily dose, but had symptomatic bradycardia on the 400 mg dose.  Needs to discuss treatment with amiodarone with her eye specialist, although the problem does not appear  to be related to the drug as far as I can tell.  Abnormal liver function tests and thyroid function tests on labs performed on 05/07/2022.  These need to be checked every 6 months. Eliquis: Dose adjusted for age and renal dysfunction.  No recent bleeding issues. CAD: Thankfully, she has no angina pectoris even though we have had to stop all her antianginals due to orthostatic hypotension.  Currently asymptomatic.  Risk factors are well addressed. CHF:  Clinically euvolemic.  Sedentary but appears to have good functional status.  Medical therapy limited by her tendency to have hypotension and falls.  Avoiding SGLT2 inhibitors due to frequent urinary tract infections. PAH:  Most likely secondary to left heart diastolic failure.  Currently denies shortness of breath.   CKD: We have previously estimated her baseline creatinine around 1.5, but most recently her creatinine was 1.1 in October. HLP: On pravastatin rather than simvastatin due to risk of interaction with amiodarone.  Excellent LDL cholesterol and excellent HDL cholesterol labs performed earlier this year. Orthostatic hypotension/HTN: Did not tolerate restarting amlodipine.  Is on short acting hydralazine.  She has not required midodrine in the last 6 months. UTI: Frequent recurrence, currently asymptomatic. Long QT interval: Due to shortage on today's ECG, actually within normal range it was prolonged in the past probably due to concomitant treatment with azithromycin.  Macrolides and other medications that can prolong the QT interval should be avoided if possible.    Patient Instructions  Medication Instructions:  The current medical regimen is effective;  continue present plan and medications.  *If you need a refill on your cardiac medications before your next appointment, please call your pharmacy*   Follow-Up: At [facility], you and your health needs are our priority.  As part of our continuing mission to provide you with  exceptional heart care, we have created designated Provider Care Teams.  These Care Teams include your primary Cardiologist (physician) and Advanced Practice Providers (APPs -  Physician Assistants and Nurse Practitioners) who all work together to provide you with the care you need, when you need it.  We recommend signing up for the patient portal called "  MyChart".  Sign up information is provided on this After Visit Summary.  MyChart is used to connect with patients for Virtual Visits (Telemedicine).  Patients are able to view lab/test results, encounter notes, upcoming appointments, etc.  Non-urgent messages can be sent to your provider as well.   To learn more about what you can do with MyChart, go to [email or web address].    Your next appointment:   6 month(s)  The format for your next appointment:   In Person  Provider:   Sanda Klein, MD             Signed, Sanda Klein, MD  07/15/2022 5:45 PM    [place]

## 2022-07-17 ENCOUNTER — Other Ambulatory Visit: Payer: Self-pay

## 2022-07-17 ENCOUNTER — Emergency Department (HOSPITAL_COMMUNITY): Payer: HMO

## 2022-07-17 ENCOUNTER — Inpatient Hospital Stay (HOSPITAL_COMMUNITY)
Admission: EM | Admit: 2022-07-17 | Discharge: 2022-07-22 | DRG: 481 | Disposition: A | Discharge status: Skilled Nursing Facility | Payer: HMO | Attending: Family Medicine | Admitting: Family Medicine

## 2022-07-17 DIAGNOSIS — N1832 Chronic kidney disease, stage 3b: Secondary | ICD-10-CM | POA: Diagnosis present

## 2022-07-17 DIAGNOSIS — Z79890 Hormone replacement therapy (postmenopausal): Secondary | ICD-10-CM

## 2022-07-17 DIAGNOSIS — I252 Old myocardial infarction: Secondary | ICD-10-CM

## 2022-07-17 DIAGNOSIS — G20C Parkinsonism, unspecified: Secondary | ICD-10-CM | POA: Diagnosis present

## 2022-07-17 DIAGNOSIS — G629 Polyneuropathy, unspecified: Secondary | ICD-10-CM | POA: Diagnosis present

## 2022-07-17 DIAGNOSIS — Z809 Family history of malignant neoplasm, unspecified: Secondary | ICD-10-CM

## 2022-07-17 DIAGNOSIS — N179 Acute kidney failure, unspecified: Secondary | ICD-10-CM | POA: Diagnosis present

## 2022-07-17 DIAGNOSIS — I5032 Chronic diastolic (congestive) heart failure: Secondary | ICD-10-CM | POA: Diagnosis present

## 2022-07-17 DIAGNOSIS — S72144A Nondisplaced intertrochanteric fracture of right femur, initial encounter for closed fracture: Principal | ICD-10-CM

## 2022-07-17 DIAGNOSIS — I480 Paroxysmal atrial fibrillation: Secondary | ICD-10-CM | POA: Diagnosis present

## 2022-07-17 DIAGNOSIS — G2581 Restless legs syndrome: Secondary | ICD-10-CM | POA: Diagnosis present

## 2022-07-17 DIAGNOSIS — R54 Age-related physical debility: Secondary | ICD-10-CM | POA: Diagnosis present

## 2022-07-17 DIAGNOSIS — Z79899 Other long term (current) drug therapy: Secondary | ICD-10-CM

## 2022-07-17 DIAGNOSIS — I2721 Secondary pulmonary arterial hypertension: Secondary | ICD-10-CM | POA: Diagnosis present

## 2022-07-17 DIAGNOSIS — M810 Age-related osteoporosis without current pathological fracture: Secondary | ICD-10-CM | POA: Diagnosis present

## 2022-07-17 DIAGNOSIS — Z8249 Family history of ischemic heart disease and other diseases of the circulatory system: Secondary | ICD-10-CM

## 2022-07-17 DIAGNOSIS — S72141A Displaced intertrochanteric fracture of right femur, initial encounter for closed fracture: Principal | ICD-10-CM | POA: Diagnosis present

## 2022-07-17 DIAGNOSIS — Z951 Presence of aortocoronary bypass graft: Secondary | ICD-10-CM

## 2022-07-17 DIAGNOSIS — D62 Acute posthemorrhagic anemia: Secondary | ICD-10-CM | POA: Diagnosis not present

## 2022-07-17 DIAGNOSIS — S7290XA Unspecified fracture of unspecified femur, initial encounter for closed fracture: Secondary | ICD-10-CM | POA: Diagnosis present

## 2022-07-17 DIAGNOSIS — E785 Hyperlipidemia, unspecified: Secondary | ICD-10-CM | POA: Diagnosis present

## 2022-07-17 DIAGNOSIS — Z90710 Acquired absence of both cervix and uterus: Secondary | ICD-10-CM

## 2022-07-17 DIAGNOSIS — E039 Hypothyroidism, unspecified: Secondary | ICD-10-CM | POA: Diagnosis present

## 2022-07-17 DIAGNOSIS — W109XXA Fall (on) (from) unspecified stairs and steps, initial encounter: Secondary | ICD-10-CM | POA: Diagnosis present

## 2022-07-17 DIAGNOSIS — I130 Hypertensive heart and chronic kidney disease with heart failure and stage 1 through stage 4 chronic kidney disease, or unspecified chronic kidney disease: Secondary | ICD-10-CM | POA: Diagnosis present

## 2022-07-17 DIAGNOSIS — F419 Anxiety disorder, unspecified: Secondary | ICD-10-CM | POA: Diagnosis present

## 2022-07-17 DIAGNOSIS — Z820 Family history of epilepsy and other diseases of the nervous system: Secondary | ICD-10-CM

## 2022-07-17 DIAGNOSIS — K219 Gastro-esophageal reflux disease without esophagitis: Secondary | ICD-10-CM | POA: Diagnosis present

## 2022-07-17 DIAGNOSIS — Y92008 Other place in unspecified non-institutional (private) residence as the place of occurrence of the external cause: Secondary | ICD-10-CM

## 2022-07-17 DIAGNOSIS — I2510 Atherosclerotic heart disease of native coronary artery without angina pectoris: Secondary | ICD-10-CM | POA: Diagnosis present

## 2022-07-17 DIAGNOSIS — Z8261 Family history of arthritis: Secondary | ICD-10-CM

## 2022-07-17 DIAGNOSIS — Z7901 Long term (current) use of anticoagulants: Secondary | ICD-10-CM

## 2022-07-17 LAB — BASIC METABOLIC PANEL
Anion gap: 8 (ref 5–15)
BUN: 17 mg/dL (ref 8–23)
CO2: 21 mmol/L — ABNORMAL LOW (ref 22–32)
Calcium: 8.9 mg/dL (ref 8.9–10.3)
Chloride: 112 mmol/L — ABNORMAL HIGH (ref 98–111)
Creatinine, Ser: 1.37 mg/dL — ABNORMAL HIGH (ref 0.44–1.00)
GFR, Estimated: 37 mL/min — ABNORMAL LOW (ref >60)
Glucose, Bld: 93 mg/dL (ref 70–99)
Potassium: 3.6 mmol/L (ref 3.5–5.1)
Sodium: 141 mmol/L (ref 135–145)

## 2022-07-17 LAB — CBC WITH DIFFERENTIAL/PLATELET
Abs Immature Granulocytes: 0.02 10*3/uL (ref 0.00–0.07)
Basophils Absolute: 0 10*3/uL (ref 0.0–0.1)
Basophils Relative: 0 %
Eosinophils Absolute: 0 10*3/uL (ref 0.0–0.5)
Eosinophils Relative: 0 %
HCT: 30.8 % — ABNORMAL LOW (ref 36.0–46.0)
Hemoglobin: 10.1 g/dL — ABNORMAL LOW (ref 12.0–15.0)
Immature Granulocytes: 0 %
Lymphocytes Relative: 9 %
Lymphs Abs: 0.6 10*3/uL — ABNORMAL LOW (ref 0.7–4.0)
MCH: 32.6 pg (ref 26.0–34.0)
MCHC: 32.8 g/dL (ref 30.0–36.0)
MCV: 99.4 fL (ref 80.0–100.0)
Monocytes Absolute: 0.4 10*3/uL (ref 0.1–1.0)
Monocytes Relative: 6 %
Neutro Abs: 5.9 10*3/uL (ref 1.7–7.7)
Neutrophils Relative %: 85 %
Platelets: 170 10*3/uL (ref 150–400)
RBC: 3.1 MIL/uL — ABNORMAL LOW (ref 3.87–5.11)
RDW: 14.6 % (ref 11.5–15.5)
WBC: 6.9 10*3/uL (ref 4.0–10.5)
nRBC: 0 % (ref 0.0–0.2)

## 2022-07-17 MED ORDER — ADULT MULTIVITAMIN W/MINERALS CH
1.0000 | ORAL_TABLET | Freq: Every day | ORAL | Status: DC
  Administered 2022-07-19 – 2022-07-22 (×4): 1 via ORAL
  Filled 2022-07-17 (×5): qty 1

## 2022-07-17 MED ORDER — ONDANSETRON HCL 4 MG/2ML IJ SOLN
4.0000 mg | Freq: Once | INTRAMUSCULAR | Status: AC
  Administered 2022-07-17: 4 mg via INTRAVENOUS
  Filled 2022-07-17: qty 2

## 2022-07-17 MED ORDER — ACETAMINOPHEN 500 MG PO TABS
1000.0000 mg | ORAL_TABLET | Freq: Four times a day (QID) | ORAL | Status: DC
  Administered 2022-07-17 – 2022-07-21 (×15): 1000 mg via ORAL
  Filled 2022-07-17 (×14): qty 2

## 2022-07-17 MED ORDER — VITAMIN D 25 MCG (1000 UNIT) PO TABS
1000.0000 [IU] | ORAL_TABLET | Freq: Every day | ORAL | Status: DC
  Administered 2022-07-19 – 2022-07-22 (×4): 1000 [IU] via ORAL
  Filled 2022-07-17 (×4): qty 1

## 2022-07-17 MED ORDER — CYCLOSPORINE 0.05 % OP EMUL
1.0000 [drp] | Freq: Two times a day (BID) | OPHTHALMIC | Status: DC
  Administered 2022-07-18 – 2022-07-22 (×8): 1 [drp] via OPHTHALMIC
  Filled 2022-07-17 (×11): qty 30

## 2022-07-17 MED ORDER — ACETAMINOPHEN 500 MG PO TABS
1000.0000 mg | ORAL_TABLET | Freq: Once | ORAL | Status: AC
  Administered 2022-07-17: 1000 mg via ORAL
  Filled 2022-07-17: qty 2

## 2022-07-17 MED ORDER — FENTANYL CITRATE PF 50 MCG/ML IJ SOSY
12.5000 ug | PREFILLED_SYRINGE | INTRAMUSCULAR | Status: DC | PRN
  Administered 2022-07-18: 12.5 ug via INTRAVENOUS
  Filled 2022-07-17: qty 1

## 2022-07-17 MED ORDER — POLYVINYL ALCOHOL 1.4 % OP SOLN
1.0000 [drp] | Freq: Four times a day (QID) | OPHTHALMIC | Status: DC
  Administered 2022-07-18 – 2022-07-22 (×18): 1 [drp] via OPHTHALMIC
  Filled 2022-07-17: qty 15

## 2022-07-17 MED ORDER — HYDROMORPHONE HCL 1 MG/ML IJ SOLN
0.5000 mg | Freq: Once | INTRAMUSCULAR | Status: AC
  Administered 2022-07-17: 0.5 mg via INTRAVENOUS
  Filled 2022-07-17: qty 1

## 2022-07-17 MED ORDER — LIDOCAINE 5 % EX PTCH
1.0000 | MEDICATED_PATCH | Freq: Every day | CUTANEOUS | Status: DC
  Administered 2022-07-17: 1 via TRANSDERMAL
  Filled 2022-07-17: qty 1

## 2022-07-17 MED ORDER — AMIODARONE HCL 200 MG PO TABS
200.0000 mg | ORAL_TABLET | Freq: Every day | ORAL | Status: DC
  Administered 2022-07-18 – 2022-07-22 (×5): 200 mg via ORAL
  Filled 2022-07-17 (×5): qty 1

## 2022-07-17 MED ORDER — PRAVASTATIN SODIUM 40 MG PO TABS
40.0000 mg | ORAL_TABLET | Freq: Every day | ORAL | Status: DC
  Administered 2022-07-18 – 2022-07-22 (×5): 40 mg via ORAL
  Filled 2022-07-17 (×5): qty 1

## 2022-07-17 MED ORDER — ROPINIROLE HCL 0.5 MG PO TABS
0.2500 mg | ORAL_TABLET | Freq: Three times a day (TID) | ORAL | Status: DC
  Administered 2022-07-18 – 2022-07-22 (×12): 0.25 mg via ORAL
  Filled 2022-07-17 (×14): qty 1

## 2022-07-17 MED ORDER — GABAPENTIN 100 MG PO CAPS
100.0000 mg | ORAL_CAPSULE | Freq: Three times a day (TID) | ORAL | Status: DC
  Administered 2022-07-18 – 2022-07-22 (×13): 100 mg via ORAL
  Filled 2022-07-17 (×15): qty 1

## 2022-07-17 MED ORDER — SENNA 8.6 MG PO TABS: 1.0000 | ORAL_TABLET | Freq: Every day | ORAL | Status: DC | PRN

## 2022-07-17 MED ORDER — BOOST / RESOURCE BREEZE PO LIQD CUSTOM
1.0000 | Freq: Three times a day (TID) | ORAL | Status: DC
  Administered 2022-07-18 – 2022-07-22 (×11): 1 via ORAL
  Filled 2022-07-17: qty 1

## 2022-07-17 MED ORDER — FENTANYL CITRATE PF 50 MCG/ML IJ SOSY: 25.0000 ug | PREFILLED_SYRINGE | Freq: Once | INTRAMUSCULAR | Status: DC

## 2022-07-17 MED ORDER — ALPRAZOLAM 0.5 MG PO TABS
0.5000 mg | ORAL_TABLET | Freq: Every day | ORAL | Status: DC
  Administered 2022-07-18 – 2022-07-21 (×5): 0.5 mg via ORAL
  Filled 2022-07-17 (×5): qty 1

## 2022-07-17 MED ORDER — POLYETHYLENE GLYCOL 3350 17 G PO PACK: 17.0000 g | PACK | Freq: Every day | ORAL | Status: DC | PRN

## 2022-07-17 MED ORDER — HYDROMORPHONE HCL 1 MG/ML IJ SOLN
0.2500 mg | Freq: Once | INTRAMUSCULAR | Status: AC
  Administered 2022-07-17: 0.25 mg via INTRAVENOUS
  Filled 2022-07-17: qty 1

## 2022-07-17 MED ORDER — LEVOTHYROXINE SODIUM 75 MCG PO TABS
75.0000 ug | ORAL_TABLET | Freq: Every day | ORAL | Status: DC
  Administered 2022-07-18 – 2022-07-22 (×5): 75 ug via ORAL
  Filled 2022-07-17 (×6): qty 1

## 2022-07-17 MED ORDER — HYDRALAZINE HCL 25 MG PO TABS
37.5000 mg | ORAL_TABLET | Freq: Three times a day (TID) | ORAL | Status: DC
  Administered 2022-07-18: 37.5 mg via ORAL
  Filled 2022-07-17: qty 2

## 2022-07-17 MED ORDER — FENTANYL CITRATE PF 50 MCG/ML IJ SOSY
12.5000 ug | PREFILLED_SYRINGE | INTRAMUSCULAR | Status: DC | PRN
  Filled 2022-07-17: qty 1

## 2022-07-17 MED ORDER — PANTOPRAZOLE SODIUM 40 MG PO TBEC
40.0000 mg | DELAYED_RELEASE_TABLET | Freq: Every day | ORAL | Status: DC
  Administered 2022-07-19 – 2022-07-22 (×4): 40 mg via ORAL
  Filled 2022-07-17 (×5): qty 1

## 2022-07-17 NOTE — H&P (Incomplete)
Hospital Admission History and Physical Service Pager: [phone]  Patient name: Jacqueline [name] Medical record number: [id number] Date of Birth: 01-26-1931 Age: 88 y.o. Gender: female  Primary Care Provider: [name], MD Consultants: Orthopedic surgery Code Status: Full code confirmed by patient   Preferred Emergency Contact:  Sons darrel and duaine mobile numbers correct in chart Contact Information     Name Relation Home Work Mobile   Zuelke,Darrell Son   [phone]   Chyrel, Taha [phone]  [phone]   Hemirc,Brandy Granddaughter   [phone]   Aarti, Mankowski Other   [phone]        Chief Complaint: fall and leg pain   Assessment and Plan: Jacqueline [name] is a 88 y.o. female presenting after a fall and with right leg injury found to have intertrochanteric femur fracture.  Fall is most likely to be mechanical as patient remembers missing the last step down the stairs and denies any cardiac prodrome or syncopal symptoms.    Past medical history significant for history of pelvic fracture in September, paroxysmal A-fib on Eliquis, CABG, hypertension, hypothyroidism, CKD stage III, tachybradycardia syndrome, HFpEF, pulmonary arterial hypertension.  No notes have been filed under this hospital service. Service: Family Medicine      FEN/GI: NPO at midnight for surgery tomorrow  VTE Prophylaxis: Holding home eliquis   Disposition: med-surg  History of Present Illness:  Jacqueline [name] is a 88 y.o. female presenting with right hip fracture after a fall.  Patient had a mechanical fall this morning.  She reports that she missed the last step going down the stairs.  She denies any palpitations, loss of vision, double vision, or other prodrome.  She says that she remembers all the events of her fall and denies hitting her head.  Patient reports that she does have vertigo but has not felt dizzy or fallen from this in quite a while, months.  She says that  she has had a previous fall due to being dizzy but this was back in the summer 2023.  In the ED, ***  Review Of Systems: Per HPI with the following additions: ***  Pertinent Past Medical History: *** (*** review and detail significant medical history here***) Remainder reviewed in history tab.   Pertinent Past Surgical History: ***  Remainder reviewed in history tab.  Pertinent Social History: Tobacco use: Yes/No/Former Alcohol use: *** Other Substance use: *** Lives with ***  Pertinent Family History: ***  Remainder reviewed in history tab.   Important Outpatient Medications: *** Remainder reviewed in medication history.   Objective: BP (!) 140/60   Pulse 62   Temp 98.2 F (36.8 C) (Oral)   Resp 16   Ht '5\' 1"'$  (1.549 m)   Wt 52.7 kg   SpO2 94%   BMI 21.95 kg/m  Exam: General: *** Eyes: *** ENTM: *** Neck: *** Cardiovascular: *** Respiratory: *** Gastrointestinal: *** MSK: *** Right lateral thigh w/ deformity of superior IT band areas, right leg lengthened compared to left, right slightly externally rotated Derm: *** Neuro: *** Psych: ***  Labs:  CBC BMET  Recent Labs  Lab 07/17/22 1856  WBC 6.9  HGB 10.1*  HCT 30.8*  PLT 170   Recent Labs  Lab 07/17/22 1856  NA 141  K 3.6  CL 112*  CO2 21*  BUN 17  CREATININE 1.37*  GLUCOSE 93  CALCIUM 8.9    Pertinent additional labs ***.  EKG: My own interpretation (not copied from electronic read) ***  Imaging Studies Performed:  Imaging Study (ie. Chest x-ray) Impression from Radiologist: ***   My Interpretation: Lowry Ram, MD 07/17/2022, 8:09 PM PGY-***, [place] Intern pager: [phone], text pages welcome Secure chat group [place]

## 2022-07-17 NOTE — ED Provider Notes (Signed)
[facility] EMERGENCY DEPARTMENT Provider Note   CSN: [id number] Arrival date & time: 07/17/22  1402     History  Chief Complaint  Patient presents with   Jacqueline [name] is a 88 y.o. female with chronic diastolic heart failure, tachy-brady syndrome, pulmonary arterial hypertension, history of CABG, HTN, GERD, paroxysmal A-fib, hypothyroidism, CKD stage III HLD, insomnia, who is brought in by EMS with fall, right leg injury.  Patient tripped and fell today while walking on the stairs, states she missed the last stairs. Did not hit her head or lose consciousness, no neck pain, back pain, chest or abdominal pain. Extreme pain to her R hip. Denies any numbness/tingling. Radiates down into her R knee.  Received 200 mcg IV fentanyl from EMS without pain relief.   HPI     Home Medications Prior to Admission medications   Medication Sig Start Date End Date Taking? Authorizing Provider  acetaminophen (TYLENOL) 500 MG tablet Take 1,000 mg by mouth every 6 (six) hours as needed for moderate pain or headache (pain/headache.).    [provider]  ALPRAZolam Prudy Feeler) 0.5 MG tablet TAKE 1 TABLET BY MOUTH AT BEDTIME. 06/29/22   Moses Manners, MD  amiodarone (PACERONE) 200 MG tablet TAKE 1 TABLET BY MOUTH EVERY DAY 06/08/22   Billey Co, MD  apixaban (ELIQUIS) 2.5 MG TABS tablet Take 1 tablet (2.5 mg total) by mouth 2 (two) times daily. 06/17/21   Cannon Kettle, PA-C  cholecalciferol (VITAMIN D3) 25 MCG (1000 UNIT) tablet Take 1,000 Units by mouth daily.    [provider]  cycloSPORINE (RESTASIS) 0.05 % ophthalmic emulsion Place 1 drop into both eyes 2 (two) times daily.    [provider]  gabapentin (NEURONTIN) 100 MG capsule TAKE 1 CAPSULE (100 MG TOTAL) BY MOUTH THREE TIMES DAILY. 05/30/22   Billey Co, MD  hydrALAZINE (APRESOLINE) 25 MG tablet TAKE 1 AND 1/2 TABLETS (37.5 MG) BY MOUTH 3 TIMES DAILY 03/30/22   Croitoru,  Mihai, MD  levothyroxine (SYNTHROID) 75 MCG tablet TAKE 1 TABLET BY MOUTH EVERY DAY 03/31/22   Hensel, Santiago Bumpers, MD  midodrine (PROAMATINE) 2.5 MG tablet TAKE 1 TABLET BY MOUTH IN MORNING AND 1 TAB 6 HOURS LATER, DO NOT LAY FLAT FOR 4 HOURS AFTER TAKING THE MEDICATION. DO NOT TAKE FOR A SYSTOLIC OF 130 OF ABOVE. Patient not taking: Reported on 07/14/2022 02/28/21   Croitoru, Rachelle Hora, MD  Multiple Vitamin (MULTIVITAMIN WITH MINERALS) TABS tablet Take 1 tablet by mouth daily.    [provider]  nitroGLYCERIN (NITROSTAT) 0.4 MG SL tablet Place 1 tablet (0.4 mg total) under the tongue every 5 (five) minutes as needed for chest pain. 12/03/21   Croitoru, Mihai, MD  Omega-3 Fatty Acids (FISH OIL) 1000 MG CAPS Take 1,000 mg by mouth every evening.    [provider]  omeprazole (PRILOSEC) 20 MG capsule TAKE 1 CAPSULE BY MOUTH EVERY DAY 01/05/22   Hensel, Santiago Bumpers, MD  Polyethyl Glycol-Propyl Glycol (LUBRICANT EYE DROPS) 0.4-0.3 % SOLN Place 1 drop into both eyes 4 (four) times daily.    [provider]  polyethylene glycol (MIRALAX / GLYCOLAX) 17 g packet Take 17 g by mouth 2 (two) times daily. Patient not taking: Reported on 07/14/2022 02/13/22   Osvaldo Shipper, MD  pravastatin (PRAVACHOL) 40 MG tablet TAKE 1 TABLET BY MOUTH EVERY DAY IN THE EVENING 05/26/22   Croitoru, Mihai, MD  rOPINIRole (REQUIP) 0.25 MG tablet  TAKE 1 TABLET BY MOUTH THREE TIMES A DAY 06/25/22   Moses Manners, MD  senna-docusate (SENOKOT-S) 8.6-50 MG tablet Take 2 tablets by mouth 2 (two) times daily. Patient not taking: Reported on 07/14/2022 02/13/22   Osvaldo Shipper, MD  traMADol (ULTRAM) 50 MG tablet Take 1 tablet (50 mg total) by mouth every 8 (eight) hours as needed. 03/02/22   Moses Manners, MD  traZODone (DESYREL) 100 MG tablet TAKE 1 TABLET BY MOUTH EVERYDAY AT BEDTIME Patient taking differently: Take 100 mg by mouth at bedtime. 09/10/21   Moses Manners, MD      Allergies    Atorvastatin,  Beta adrenergic blockers, Crestor [rosuvastatin], Morphine and related, and Prednisone    Review of Systems   Review of Systems Review of systems Negative for head injury, LOC, neck pain.  A 10 point review of systems was performed and is negative unless otherwise reported in HPI.  Physical Exam Updated Vital Signs BP (!) 162/66   Pulse 65   Resp 15   Ht 5\' 1"  (1.549 m)   Wt 52.7 kg   SpO2 96%   BMI 21.95 kg/m  Physical Exam General: Very uncomfortable-appearing elderly female, lying in bed.  HEENT: PERRLA, EOMI, Sclera anicteric, MMM, trachea midline. NCAT. No c-spine tenderness to palpation or step-offs. No scalp injuries/wounds, no skull deformities/depressions. No raccoon eyes.  Cardiology: RRR, no murmurs/rubs/gallops. BL radial and DP pulses equal bilaterally.  Resp: Normal respiratory rate and effort. CTAB, no wheezes, rhonchi, crackles.  Abd: Soft, non-tender, non-distended. No rebound tenderness or guarding.  GU: Deferred. MSK: BL LEs symmetric in length. TTP to R hip/femur without any obvious deformities or signs of injury. Pelvis stable. Intact dp/pt pulses. NVI. ROM of R hip limited d/t pain. Unremarkable R femur, knee, ankle, foot. Extremities without deformity or TTP. No cyanosis or clubbing. Compartments are soft. Skin: warm, dry. No rashes or lesions. Back: No T or L spine tenderness or stepoffs.  Neuro: A&Ox4, CNs II-XII grossly intact. MAEs. Sensation grossly intact.  Psych: Normal mood and affect.   ED Results / Procedures / Treatments   Labs (all labs ordered are listed, but only abnormal results are displayed) Labs Reviewed - No data to display  EKG None  Radiology No results found.  Procedures Procedures    Medications Ordered in ED Medications  HYDROmorphone (DILAUDID) injection 0.25 mg (0.25 mg Intravenous Given 07/17/22 1535)  ondansetron (ZOFRAN) injection 4 mg (4 mg Intravenous Given 07/17/22 1531)    ED Course/ Medical Decision Making/  A&P                          Medical Decision Making Amount and/or Complexity of Data Reviewed Labs: ordered. Radiology: ordered.  Risk OTC drugs. Prescription drug management. Decision regarding hospitalization.    MDM:    Patient with mechanical fall, very uncomfortable w/ R hip pain. Great concern for R hip fracture, dislocation, pelvic fracture, femur fracture. She has equal length extremities and is NVI. Compartments are soft, no c/f compartment syndrome. She is extremely uncomfortable and already received fentanyl from EMS< will give dilaudid and zofran here.   Clinical Course as of 08/11/22 2215  Fri Jul 17, 2022  1620 Patient is signed out to the oncoming ED physician who is made aware of her history, presentation, exam, workup, and plan.  Plan is to obtain XRs [HN]  1623 Received sign out pending R hip/pelvis XR, fall onto right side. No  head injury [WS]  1836 X-ray shows a right intertrochanteric hip fracture.  Discussed with Dr. Venita Lick, recommends admission to the hospitalist service.  N.p.o. after midnight, can eat tonight. [WS]  2015 Admitted to family medicine service  [WS]    Clinical Course User Index [HN] Loetta Rough, MD [WS] Lonell Grandchild, MD     Labs: I Ordered labs, they are pending at time of signout  Imaging Studies ordered: I ordered imaging studies including Pelvis/hip femur xrays, pending at time of signout  Additional history obtained from EMS, chart review.    Social Determinants of Health: Patient lives independently  Disposition:  TBD pending XRs, admit if there is fracture   Co morbidities that complicate the patient evaluation  Past Medical History:  Diagnosis Date   Arthritis    back   Atrial fibrillation with rapid ventricular response (HCC) 02/2014   a. CHA2DS2VASc = 6 -> on eliquis;  b. 02/2014 s/p DCCV;  c. 08/2014 Echo: EF 55-60%, Gr 2 DD, mild MR, triv AI.   CAD (coronary artery disease)    a. s/p CABG x 2  (LIMA->LAD, VG->Diag);  b. 08/2014 Cath: LM nl, LAD 90p, LCX nl, RCA nl/dominant, LIMA->LAD atretic, VG->D2 patent w/ retrograde filling of LAD, EF 55-60%-->Med Rx.   Diverticulitis 02/21/2012   GERD (gastroesophageal reflux disease)    Hyperlipemia    Hypertension    Insomnia      Medicines Meds ordered this encounter  Medications   DISCONTD: fentaNYL (SUBLIMAZE) injection 25 mcg   HYDROmorphone (DILAUDID) injection 0.25 mg   ondansetron (ZOFRAN) injection 4 mg    I have reviewed the patients home medicines and have made adjustments as needed  Problem List / ED Course: Problem List Items Addressed This Visit       Musculoskeletal and Integument   * (Principal) Femur fracture (HCC) - Primary    S/p repair of R femur fracture, day #4. Significant pain with movement limiting rehabilitation effort. Unable to work with PT yesterday due to pain. Discussed timing oxycodone prior to movement since pain is mostly controlled at rest. Denies recent BM, will continue current therapy and escalate tomorrow if no progress. Would like to go to SNF for continued recovery -[facility] signed off, will f/u outpatient in 2 weeks -Pain control: Tylenol 1000 mg scheduled every 6 hours, oxycodone 2.5-5 mg q4h prn, Roxiban 1000mg  q6h prn -Bowel regimen: Miralax BID, Senna BID -PT/OT following -Continue Eliquis                 This note was created using dictation software, which may contain spelling or grammatical errors.    Loetta Rough, MD 08/11/22 2220

## 2022-07-17 NOTE — ED Provider Notes (Signed)
  ED Course / MDM   Clinical Course as of 07/17/22 2016  Fri Jul 17, 2022  1620 Patient is signed out to the oncoming ED physician who is made aware of her history, presentation, exam, workup, and plan.  Plan is to obtain XRs [HN]  1623 Received sign out pending R hip/pelvis XR, fall onto right side. No head injury [WS]  1836 X-ray shows a right intertrochanteric hip fracture.  Discussed with Dr. Melina Schools, recommends admission to the hospitalist service.  N.p.o. after midnight, can eat tonight. [WS]  2015 Admitted to family medicine service  [WS]    Clinical Course User Index [HN] Audley Hose, MD [WS] Cristie Hem, MD   Medical Decision Making Amount and/or Complexity of Data Reviewed Labs: ordered. Radiology: ordered.  Risk OTC drugs. Prescription drug management. Decision regarding hospitalization.        Cristie Hem, MD 07/17/22 2016

## 2022-07-17 NOTE — H&P (Addendum)
Hospital Admission History and Physical Service Pager: [phone]  Patient name: Tema Alire Medical record number: [id number] Date of Birth: [birth year] Age: 88 y.o. Gender: female  Primary Care Provider: [name], MD Consultants: Orthopedic surgery Code Status: Full code confirmed by patient   Preferred Emergency Contact:  Sons darrel and duaine mobile numbers correct in chart Contact Information     Name Relation Home Work Mobile   Decaire,Darrell Son   [phone]   Joud, Pettin[place] [phone]  [phone]   Hemirc,Brandy Granddaughter   [phone]   Jazmyne, [place] Other   [phone]      Chief Complaint: fall and leg pain   Assessment and Plan: Lucee Brissett is a 88 y.o. female presenting after a fall and with right leg injury found to have intertrochanteric femur fracture.  Fall is most likely to be mechanical as patient remembers missing the last step down the stairs and denies any cardiac prodrome or syncopal symptoms.    Past medical history significant for history of pelvic fracture in September, paroxysmal A-fib on Eliquis, CABG, hypertension, hypothyroidism, CKD stage III, tachybradycardia syndrome, HFpEF, pulmonary arterial hypertension.  * Femur fracture ([place]) Mildly distracted fracture of right intertrochanteric femur.  [facility] has been consulted and hopefully will be able to schedule surgery for tomorrow or Sunday. - Admit to [facility] MedSurg, attending Dr. Thompson Grayer - Rosanne Gutting consulted, appreciate recs - Pain control: Tylenol 1000 mg scheduled every 6 hours, fentanyl 12.5 mcg every 4 hours as needed  -As needed MiraLAX and senna  -Avoid NSAIDs as patient has CKD 3 - N.p.o. at midnight - Continue home vitamin D 1000 mg daily -A.m. CBC and BMP   Chronic Conditions A-fib: Continue amiodarone 200 mg daily Hypertension: Continue hydralazine 37.5 mg 3 times daily Hyperlipidemia: Continue pravastatin 40 mg daily Hypothyroidism: Continue Synthroid  75 mcg daily GERD: Continue Protonix 40 mg daily Neuropathy: Continue gabapentin 100 mg 3 times daily Anxiety: Continue home Xanax 0.5 mg daily at bedtime Parkinsonism/restless leg syndrome: Continue ropinirole 0.25 mg 3 times daily   FEN/GI: NPO at midnight for surgery tomorrow  VTE Prophylaxis: Holding home eliquis for procedure, discussed VTE prophylaxis with pharmacy.  Will continue with SCDs and hold off on pharmacologic prophylaxis as patient will most likely still be therapeutic on Eliquis due to patient's age, weight.  Will dose with heparin tomorrow if patient does not go for surgery.  Disposition: med-surg  History of Present Illness:  Caleyah Jr is a 88 y.o. female presenting with right hip fracture after a fall.  Patient had a mechanical fall this morning.  She reports that she missed the last step going down the stairs.  She denies any palpitations, loss of vision, double vision, or other prodrome.  She says that she remembers all the events of her fall and denies hitting her head.  Patient reports that she does have vertigo but has not felt dizzy or fallen from this in quite a while, months.  She says that she has had a previous fall due to being dizzy but this was back in the summer 2023.  In the ED, patient's vitals were stable.  CT pelvis, femur, knee were remarkable for mildly distracted fracture of the intertrochanteric right femur and healing fractures of the superior and inferior right pubic rami.  Labs were remarkable for creatinine of 1.37 elevated from baseline.  She received 1000 mg of Tylenol, Dilaudid 0.25 mg and Dilaudid 0.5 mg IV, lidocaine patch, Zofran 4 mg.  The ED  doctor contacted [facility] as they have taken care of the patient before.  [facility] mentioned that they would be able to hopefully schedule patient for surgery either tomorrow a.m. or Sunday.  Review Of Systems: Per HPI with the following additions: Right hip pain.  Pertinent Past Medical  History: Pelvic fracture in September Paroxysmal A-fib on Eliquis CABG Hypertension Hypothyroidism CKD stage III Tachybradycardia syndrome HFpEF Pulmonary arterial hypertension   Denies history of osteoporosis or osteopenia Has diagnosis by DEXA scan of osteoporosis 2015 T equals -2.7 was on Fosamax began 02/2014.  Not currently on bisphosphonate Remainder reviewed in history tab.   Pertinent Past Surgical History:  Remainder reviewed in history tab.   Pertinent Social History: Tobacco use: No Alcohol use: No, never Other Substance use: None Lives by herself, her son lives in a house behind her.  Pertinent Family History: Remainder reviewed in history tab.   Important Outpatient Medications: Eliquis 2.5 twice daily Amiodarone 200 mg daily Hydralazine 37.5 mg 3 times daily Synthroid 75 mcg daily Ropinirole 0.25 mg 3 times a day  Pravastatin 40 mg daily Xanax 0.5 mg at bedtime Trazodone 100 mg at bedtime Remainder reviewed in medication history.   Objective: BP (!) 132/57 (BP Location: Right Arm)   Pulse (!) 58   Temp 98.3 F (36.8 C) (Oral)   Resp 20   Ht '5\' 1"'$  (1.549 m)   Wt 52.7 kg   SpO2 98%   BMI 21.95 kg/m  Exam: General: Frail though well-appearing ENTM: Dry mucous membranes, head atraumatic Cardiovascular: Regular rate and rhythm, no murmurs gallops, pulses equal and palpable radially, pulses palpable on both feet dorsal pedal, cap refill less than 2 seconds, both feet appropriately warm Respiratory: Normal work of breathing on room air Gastrointestinal: Soft, nondistended, nontender MSK:  Right lateral thigh w/ dip like deformity of superior IT band areas, right leg lengthened compared to left, right slightly externally rotated Derm: Ecchymosis on right elbow Neuro: Alert and oriented x 4, able to move toes on both legs with no difficulty, strength limited to pain on right leg  Labs:  CBC BMET  Recent Labs  Lab 07/17/22 1856  WBC 6.9  HGB 10.1*   HCT 30.8*  PLT 170   Recent Labs  Lab 07/17/22 1856  NA 141  K 3.6  CL 112*  CO2 21*  BUN 17  CREATININE 1.37*  GLUCOSE 93  CALCIUM 8.9     EKG: Not yet performed  Imaging Studies Performed: DG pelvis, DG femur, DG knee 1. Mildly distracted fracture of the intertrochanteric right femur. 2. Healing fractures of the superior and inferior right pubic rami.   Lowry Ram, MD 07/18/2022, 12:38 AM PGY-1, [place] Intern pager: [phone], text pages welcome Secure chat group [facility] Teaching Service   Upper Level Addendum: I have seen and evaluated this patient along with Dr. Gwendolyn Lima and reviewed the above note, making necessary revisions as appropriate. I agree with the medical decision making and physical exam as noted above. Ezequiel Essex, MD PGY-3 [place] [facility] [place] [facility]

## 2022-07-17 NOTE — ED Triage Notes (Signed)
Pt bib ems from home; coming down steps, missed last step, fell onto R side; deformity to upper femur; no shortening or rotation; did not hit head, denies neck, back pain; pt on eliquis for afib; BP 142/68, hr 68 02 96% RA, 20ga LFA; 200 mcg fentanyl total given w/o relief; last dose given 1351

## 2022-07-18 ENCOUNTER — Encounter (HOSPITAL_COMMUNITY)
Admission: EM | Disposition: A | Discharge status: Skilled Nursing Facility | Payer: Self-pay | Source: Home / Self Care | Attending: Family Medicine

## 2022-07-18 ENCOUNTER — Encounter (HOSPITAL_COMMUNITY): Payer: Self-pay | Admitting: Family Medicine

## 2022-07-18 ENCOUNTER — Observation Stay (HOSPITAL_BASED_OUTPATIENT_CLINIC_OR_DEPARTMENT_OTHER): Payer: HMO | Admitting: Anesthesiology

## 2022-07-18 ENCOUNTER — Other Ambulatory Visit: Payer: Self-pay

## 2022-07-18 ENCOUNTER — Observation Stay (HOSPITAL_COMMUNITY): Payer: HMO | Admitting: Anesthesiology

## 2022-07-18 ENCOUNTER — Observation Stay (HOSPITAL_COMMUNITY): Payer: HMO

## 2022-07-18 DIAGNOSIS — M810 Age-related osteoporosis without current pathological fracture: Secondary | ICD-10-CM | POA: Diagnosis present

## 2022-07-18 DIAGNOSIS — Z8249 Family history of ischemic heart disease and other diseases of the circulatory system: Secondary | ICD-10-CM | POA: Diagnosis not present

## 2022-07-18 DIAGNOSIS — E785 Hyperlipidemia, unspecified: Secondary | ICD-10-CM | POA: Diagnosis present

## 2022-07-18 DIAGNOSIS — Z820 Family history of epilepsy and other diseases of the nervous system: Secondary | ICD-10-CM | POA: Diagnosis not present

## 2022-07-18 DIAGNOSIS — W109XXA Fall (on) (from) unspecified stairs and steps, initial encounter: Secondary | ICD-10-CM | POA: Diagnosis present

## 2022-07-18 DIAGNOSIS — Z951 Presence of aortocoronary bypass graft: Secondary | ICD-10-CM | POA: Diagnosis not present

## 2022-07-18 DIAGNOSIS — S7291XA Unspecified fracture of right femur, initial encounter for closed fracture: Secondary | ICD-10-CM | POA: Diagnosis not present

## 2022-07-18 DIAGNOSIS — S72144A Nondisplaced intertrochanteric fracture of right femur, initial encounter for closed fracture: Secondary | ICD-10-CM | POA: Diagnosis not present

## 2022-07-18 DIAGNOSIS — S72141A Displaced intertrochanteric fracture of right femur, initial encounter for closed fracture: Secondary | ICD-10-CM | POA: Diagnosis present

## 2022-07-18 DIAGNOSIS — F419 Anxiety disorder, unspecified: Secondary | ICD-10-CM | POA: Diagnosis present

## 2022-07-18 DIAGNOSIS — I509 Heart failure, unspecified: Secondary | ICD-10-CM | POA: Diagnosis not present

## 2022-07-18 DIAGNOSIS — Y92008 Other place in unspecified non-institutional (private) residence as the place of occurrence of the external cause: Secondary | ICD-10-CM | POA: Diagnosis not present

## 2022-07-18 DIAGNOSIS — G629 Polyneuropathy, unspecified: Secondary | ICD-10-CM | POA: Diagnosis present

## 2022-07-18 DIAGNOSIS — E039 Hypothyroidism, unspecified: Secondary | ICD-10-CM | POA: Diagnosis present

## 2022-07-18 DIAGNOSIS — I110 Hypertensive heart disease with heart failure: Secondary | ICD-10-CM

## 2022-07-18 DIAGNOSIS — S72001A Fracture of unspecified part of neck of right femur, initial encounter for closed fracture: Secondary | ICD-10-CM | POA: Diagnosis not present

## 2022-07-18 DIAGNOSIS — I5032 Chronic diastolic (congestive) heart failure: Secondary | ICD-10-CM | POA: Diagnosis present

## 2022-07-18 DIAGNOSIS — I2510 Atherosclerotic heart disease of native coronary artery without angina pectoris: Secondary | ICD-10-CM | POA: Diagnosis not present

## 2022-07-18 DIAGNOSIS — Z809 Family history of malignant neoplasm, unspecified: Secondary | ICD-10-CM | POA: Diagnosis not present

## 2022-07-18 DIAGNOSIS — Z79890 Hormone replacement therapy (postmenopausal): Secondary | ICD-10-CM | POA: Diagnosis not present

## 2022-07-18 DIAGNOSIS — I2721 Secondary pulmonary arterial hypertension: Secondary | ICD-10-CM | POA: Diagnosis present

## 2022-07-18 DIAGNOSIS — G20C Parkinsonism, unspecified: Secondary | ICD-10-CM | POA: Diagnosis present

## 2022-07-18 DIAGNOSIS — Z7901 Long term (current) use of anticoagulants: Secondary | ICD-10-CM | POA: Diagnosis not present

## 2022-07-18 DIAGNOSIS — I130 Hypertensive heart and chronic kidney disease with heart failure and stage 1 through stage 4 chronic kidney disease, or unspecified chronic kidney disease: Secondary | ICD-10-CM | POA: Diagnosis present

## 2022-07-18 DIAGNOSIS — N1832 Chronic kidney disease, stage 3b: Secondary | ICD-10-CM | POA: Diagnosis present

## 2022-07-18 DIAGNOSIS — I252 Old myocardial infarction: Secondary | ICD-10-CM | POA: Diagnosis not present

## 2022-07-18 DIAGNOSIS — K219 Gastro-esophageal reflux disease without esophagitis: Secondary | ICD-10-CM | POA: Diagnosis present

## 2022-07-18 DIAGNOSIS — D62 Acute posthemorrhagic anemia: Secondary | ICD-10-CM | POA: Diagnosis not present

## 2022-07-18 DIAGNOSIS — Z79899 Other long term (current) drug therapy: Secondary | ICD-10-CM | POA: Diagnosis not present

## 2022-07-18 DIAGNOSIS — I480 Paroxysmal atrial fibrillation: Secondary | ICD-10-CM | POA: Diagnosis present

## 2022-07-18 DIAGNOSIS — G2581 Restless legs syndrome: Secondary | ICD-10-CM | POA: Diagnosis present

## 2022-07-18 HISTORY — PX: FEMUR IM NAIL: SHX1597

## 2022-07-18 LAB — CBC
HCT: 27.1 % — ABNORMAL LOW (ref 36.0–46.0)
Hemoglobin: 8.7 g/dL — ABNORMAL LOW (ref 12.0–15.0)
MCH: 32.1 pg (ref 26.0–34.0)
MCHC: 32.1 g/dL (ref 30.0–36.0)
MCV: 100 fL (ref 80.0–100.0)
Platelets: 156 10*3/uL (ref 150–400)
RBC: 2.71 MIL/uL — ABNORMAL LOW (ref 3.87–5.11)
RDW: 14.7 % (ref 11.5–15.5)
WBC: 5.2 10*3/uL (ref 4.0–10.5)
nRBC: 0 % (ref 0.0–0.2)

## 2022-07-18 LAB — BASIC METABOLIC PANEL
Anion gap: 4 — ABNORMAL LOW (ref 5–15)
BUN: 18 mg/dL (ref 8–23)
CO2: 25 mmol/L (ref 22–32)
Calcium: 8.9 mg/dL (ref 8.9–10.3)
Chloride: 112 mmol/L — ABNORMAL HIGH (ref 98–111)
Creatinine, Ser: 1.34 mg/dL — ABNORMAL HIGH (ref 0.44–1.00)
GFR, Estimated: 38 mL/min — ABNORMAL LOW (ref >60)
Glucose, Bld: 115 mg/dL — ABNORMAL HIGH (ref 70–99)
Potassium: 4.3 mmol/L (ref 3.5–5.1)
Sodium: 141 mmol/L (ref 135–145)

## 2022-07-18 LAB — SURGICAL PCR SCREEN
MRSA, PCR: NEGATIVE
Staphylococcus aureus: NEGATIVE

## 2022-07-18 SURGERY — INSERTION, INTRAMEDULLARY ROD, FEMUR
Anesthesia: General | Laterality: Right

## 2022-07-18 MED ORDER — TRANEXAMIC ACID-NACL 1000-0.7 MG/100ML-% IV SOLN
INTRAVENOUS | Status: AC
  Filled 2022-07-18: qty 100

## 2022-07-18 MED ORDER — POVIDONE-IODINE 10 % EX SWAB
2.0000 | Freq: Once | CUTANEOUS | Status: AC
  Administered 2022-07-18: 2 via TOPICAL

## 2022-07-18 MED ORDER — ACETAMINOPHEN 500 MG PO TABS: 1000.0000 mg | ORAL_TABLET | Freq: Once | ORAL | Status: AC

## 2022-07-18 MED ORDER — OXYCODONE HCL 5 MG PO TABS
2.5000 mg | ORAL_TABLET | ORAL | Status: DC | PRN
  Administered 2022-07-18 – 2022-07-19 (×4): 5 mg via ORAL
  Administered 2022-07-20: 2.5 mg via ORAL
  Administered 2022-07-20: 5 mg via ORAL
  Administered 2022-07-21: 2.5 mg via ORAL
  Administered 2022-07-21 – 2022-07-22 (×5): 5 mg via ORAL
  Filled 2022-07-18 (×12): qty 1

## 2022-07-18 MED ORDER — ORAL CARE MOUTH RINSE: 15.0000 mL | Freq: Once | OROMUCOSAL | Status: AC

## 2022-07-18 MED ORDER — ONDANSETRON HCL 4 MG PO TABS: 4.0000 mg | ORAL_TABLET | Freq: Four times a day (QID) | ORAL | Status: DC | PRN

## 2022-07-18 MED ORDER — 0.9 % SODIUM CHLORIDE (POUR BTL) OPTIME
TOPICAL | Status: DC | PRN
  Administered 2022-07-18: 1000 mL

## 2022-07-18 MED ORDER — CEFAZOLIN SODIUM-DEXTROSE 2-4 GM/100ML-% IV SOLN
2.0000 g | Freq: Two times a day (BID) | INTRAVENOUS | Status: AC
  Administered 2022-07-18 – 2022-07-19 (×2): 2 g via INTRAVENOUS
  Filled 2022-07-18 (×2): qty 100

## 2022-07-18 MED ORDER — ONDANSETRON HCL 4 MG/2ML IJ SOLN: 4.0000 mg | Freq: Four times a day (QID) | INTRAMUSCULAR | Status: DC | PRN

## 2022-07-18 MED ORDER — METOCLOPRAMIDE HCL 5 MG PO TABS: 5.0000 mg | ORAL_TABLET | Freq: Three times a day (TID) | ORAL | Status: DC | PRN

## 2022-07-18 MED ORDER — ROCURONIUM BROMIDE 10 MG/ML (PF) SYRINGE
PREFILLED_SYRINGE | INTRAVENOUS | Status: DC | PRN
  Administered 2022-07-18: 50 mg via INTRAVENOUS

## 2022-07-18 MED ORDER — ONDANSETRON HCL 4 MG/2ML IJ SOLN
INTRAMUSCULAR | Status: DC | PRN
  Administered 2022-07-18: 4 mg via INTRAVENOUS

## 2022-07-18 MED ORDER — AMLODIPINE BESYLATE 5 MG PO TABS
5.0000 mg | ORAL_TABLET | Freq: Every day | ORAL | Status: DC
  Administered 2022-07-19 – 2022-07-22 (×4): 5 mg via ORAL
  Filled 2022-07-18 (×5): qty 1

## 2022-07-18 MED ORDER — LIDOCAINE 2% (20 MG/ML) 5 ML SYRINGE
INTRAMUSCULAR | Status: DC | PRN
  Administered 2022-07-18: 30 mg via INTRAVENOUS

## 2022-07-18 MED ORDER — CHLORHEXIDINE GLUCONATE 0.12 % MT SOLN: 15.0000 mL | Freq: Once | OROMUCOSAL | Status: AC

## 2022-07-18 MED ORDER — FENTANYL CITRATE (PF) 100 MCG/2ML IJ SOLN
INTRAMUSCULAR | Status: AC
  Filled 2022-07-18: qty 2

## 2022-07-18 MED ORDER — DOCUSATE SODIUM 100 MG PO CAPS
100.0000 mg | ORAL_CAPSULE | Freq: Two times a day (BID) | ORAL | Status: DC
  Administered 2022-07-18 – 2022-07-19 (×2): 100 mg via ORAL
  Filled 2022-07-18 (×2): qty 1

## 2022-07-18 MED ORDER — METHOCARBAMOL 500 MG PO TABS
500.0000 mg | ORAL_TABLET | Freq: Four times a day (QID) | ORAL | Status: DC | PRN
  Administered 2022-07-18 – 2022-07-21 (×7): 500 mg via ORAL
  Filled 2022-07-18 (×7): qty 1

## 2022-07-18 MED ORDER — METHOCARBAMOL 1000 MG/10ML IJ SOLN: 500.0000 mg | Freq: Four times a day (QID) | INTRAVENOUS | Status: DC | PRN

## 2022-07-18 MED ORDER — CHLORHEXIDINE GLUCONATE 0.12 % MT SOLN
OROMUCOSAL | Status: AC
  Administered 2022-07-18: 15 mL via OROMUCOSAL
  Filled 2022-07-18: qty 15

## 2022-07-18 MED ORDER — FENTANYL CITRATE (PF) 250 MCG/5ML IJ SOLN
INTRAMUSCULAR | Status: DC | PRN
  Administered 2022-07-18: 50 ug via INTRAVENOUS
  Administered 2022-07-18: 100 ug via INTRAVENOUS

## 2022-07-18 MED ORDER — PHENOL 1.4 % MT LIQD: 1.0000 | OROMUCOSAL | Status: DC | PRN

## 2022-07-18 MED ORDER — LACTATED RINGERS IV SOLN: INTRAVENOUS | Status: DC

## 2022-07-18 MED ORDER — TRANEXAMIC ACID-NACL 1000-0.7 MG/100ML-% IV SOLN
1000.0000 mg | INTRAVENOUS | Status: AC
  Administered 2022-07-18: 1000 mg via INTRAVENOUS

## 2022-07-18 MED ORDER — ACETAMINOPHEN 500 MG PO TABS
ORAL_TABLET | ORAL | Status: AC
  Administered 2022-07-18: 1000 mg via ORAL
  Filled 2022-07-18: qty 2

## 2022-07-18 MED ORDER — MENTHOL 3 MG MT LOZG: 1.0000 | LOZENGE | OROMUCOSAL | Status: DC | PRN

## 2022-07-18 MED ORDER — CEFAZOLIN SODIUM-DEXTROSE 2-4 GM/100ML-% IV SOLN
INTRAVENOUS | Status: AC
  Filled 2022-07-18: qty 100

## 2022-07-18 MED ORDER — HYDROCODONE-ACETAMINOPHEN 7.5-325 MG PO TABS
1.0000 | ORAL_TABLET | ORAL | Status: DC | PRN
  Filled 2022-07-18: qty 2

## 2022-07-18 MED ORDER — SUGAMMADEX SODIUM 200 MG/2ML IV SOLN
INTRAVENOUS | Status: DC | PRN
  Administered 2022-07-18: 200 mg via INTRAVENOUS

## 2022-07-18 MED ORDER — METOCLOPRAMIDE HCL 5 MG/ML IJ SOLN: 5.0000 mg | Freq: Three times a day (TID) | INTRAMUSCULAR | Status: DC | PRN

## 2022-07-18 MED ORDER — CHLORHEXIDINE GLUCONATE 4 % EX LIQD: 60.0000 mL | Freq: Once | CUTANEOUS | Status: DC

## 2022-07-18 MED ORDER — PHENYLEPHRINE 80 MCG/ML (10ML) SYRINGE FOR IV PUSH (FOR BLOOD PRESSURE SUPPORT)
PREFILLED_SYRINGE | INTRAVENOUS | Status: DC | PRN
  Administered 2022-07-18: 80 ug via INTRAVENOUS
  Administered 2022-07-18: 100 ug via INTRAVENOUS

## 2022-07-18 MED ORDER — FENTANYL CITRATE (PF) 250 MCG/5ML IJ SOLN
INTRAMUSCULAR | Status: AC
  Filled 2022-07-18: qty 5

## 2022-07-18 MED ORDER — HYDROCODONE-ACETAMINOPHEN 5-325 MG PO TABS: 1.0000 | ORAL_TABLET | ORAL | Status: DC | PRN

## 2022-07-18 MED ORDER — TRANEXAMIC ACID-NACL 1000-0.7 MG/100ML-% IV SOLN
1000.0000 mg | Freq: Once | INTRAVENOUS | Status: AC
  Administered 2022-07-18: 1000 mg via INTRAVENOUS
  Filled 2022-07-18: qty 100

## 2022-07-18 MED ORDER — ALBUMIN HUMAN 5 % IV SOLN: INTRAVENOUS | Status: DC | PRN

## 2022-07-18 MED ORDER — PROPOFOL 10 MG/ML IV BOLUS
INTRAVENOUS | Status: DC | PRN
  Administered 2022-07-18: 120 mg via INTRAVENOUS

## 2022-07-18 MED ORDER — FENTANYL CITRATE (PF) 100 MCG/2ML IJ SOLN
25.0000 ug | INTRAMUSCULAR | Status: DC | PRN
  Administered 2022-07-18: 25 ug via INTRAVENOUS
  Administered 2022-07-18: 50 ug via INTRAVENOUS
  Administered 2022-07-18 (×2): 25 ug via INTRAVENOUS

## 2022-07-18 MED ORDER — CEFAZOLIN SODIUM-DEXTROSE 2-4 GM/100ML-% IV SOLN: 2.0000 g | Freq: Four times a day (QID) | INTRAVENOUS | Status: DC

## 2022-07-18 MED ORDER — CEFAZOLIN SODIUM-DEXTROSE 2-4 GM/100ML-% IV SOLN
2.0000 g | INTRAVENOUS | Status: AC
  Administered 2022-07-18: 2 g via INTRAVENOUS

## 2022-07-18 SURGICAL SUPPLY — 37 items
BAG COUNTER SPONGE SURGICOUNT (BAG) ×1 IMPLANT
BAG SPNG CNTER NS LX DISP (BAG) ×1
BIT DRILL AO GAMMA 4.2X340 (BIT) IMPLANT
CLSR STERI-STRIP ANTIMIC 1/2X4 (GAUZE/BANDAGES/DRESSINGS) ×1 IMPLANT
COVER PERINEAL POST (MISCELLANEOUS) ×1 IMPLANT
COVER SURGICAL LIGHT HANDLE (MISCELLANEOUS) ×1 IMPLANT
DRAPE STERI IOBAN 125X83 (DRAPES) ×1 IMPLANT
DRESSING MEPILEX FLEX 4X4 (GAUZE/BANDAGES/DRESSINGS) IMPLANT
DRSG MEPILEX FLEX 4X4 (GAUZE/BANDAGES/DRESSINGS) ×6
DURAPREP 26ML APPLICATOR (WOUND CARE) ×1 IMPLANT
GLOVE BIO SURGEON STRL SZ7.5 (GLOVE) ×1 IMPLANT
GLOVE BIOGEL PI IND STRL 7.0 (GLOVE) IMPLANT
GLOVE BIOGEL PI IND STRL 8 (GLOVE) ×1 IMPLANT
GLOVE SURG SYN 7.5  E (GLOVE) ×1
GLOVE SURG SYN 7.5 E (GLOVE) ×1 IMPLANT
GLOVE SURG SYN 7.5 PF PI (GLOVE) ×1 IMPLANT
GOWN STRL REUS W/ TWL LRG LVL3 (GOWN DISPOSABLE) ×2 IMPLANT
GOWN STRL REUS W/ TWL XL LVL3 (GOWN DISPOSABLE) ×2 IMPLANT
GOWN STRL REUS W/TWL LRG LVL3 (GOWN DISPOSABLE) ×2
GOWN STRL REUS W/TWL XL LVL3 (GOWN DISPOSABLE) ×1
K-WIRE  3.2X450M STR (WIRE) ×1
K-WIRE 3.2X450M STR (WIRE) ×1
KIT BASIN OR (CUSTOM PROCEDURE TRAY) ×1 IMPLANT
KIT TURNOVER KIT B (KITS) ×1 IMPLANT
KWIRE 3.2X450M STR (WIRE) IMPLANT
NAIL KIT TROCH 10X170X125 (Nail) IMPLANT
NS IRRIG 1000ML POUR BTL (IV SOLUTION) ×1 IMPLANT
PACK GENERAL/GYN (CUSTOM PROCEDURE TRAY) ×1 IMPLANT
PAD ARMBOARD 7.5X6 YLW CONV (MISCELLANEOUS) ×2 IMPLANT
SCREW LAG GAMMA 3 TI 10.5X90MM (Screw) IMPLANT
SCREW LOCKING T2 F/T  5MMX35MM (Screw) ×1 IMPLANT
SCREW LOCKING T2 F/T 5MMX35MM (Screw) IMPLANT
SUT MNCRL AB 4-0 PS2 18 (SUTURE) ×1 IMPLANT
SUT VIC AB 2-0 CT1 27 (SUTURE) ×1
SUT VIC AB 2-0 CT1 TAPERPNT 27 (SUTURE) ×1 IMPLANT
TOWEL GREEN STERILE (TOWEL DISPOSABLE) ×1 IMPLANT
WATER STERILE IRR 1000ML POUR (IV SOLUTION) ×1 IMPLANT

## 2022-07-18 NOTE — Anesthesia Preprocedure Evaluation (Addendum)
Anesthesia Evaluation  Patient identified by MRN, date of birth, ID band Patient awake    Reviewed: Allergy & Precautions, NPO status , Patient's Chart, lab work & pertinent test results  Airway Mallampati: I  TM Distance: >3 FB Neck ROM: Full    Dental  (+) Edentulous Upper, Edentulous Lower   Pulmonary neg pulmonary ROS    + decreased breath sounds      Cardiovascular hypertension, + CAD, + CABG and +CHF  + dysrhythmias Atrial Fibrillation  Rhythm:Regular Rate:Normal  Echo: 1. Left ventricular ejection fraction, by estimation, is 60 to 65%. The  left ventricle has normal function. The left ventricle has no regional  wall motion abnormalities. There is mild left ventricular hypertrophy.  Left ventricular diastolic parameters  are indeterminate.   2. Right ventricular systolic function is normal. The right ventricular  size is normal.   3. The mitral valve is normal in structure. Trivial mitral valve  regurgitation. No evidence of mitral stenosis.   4. The aortic valve is tricuspid. Aortic valve regurgitation is trivial.  Aortic valve sclerosis is present, with no evidence of aortic valve  stenosis.   5. The inferior vena cava is normal in size with greater than 50%  respiratory variability, suggesting right atrial pressure of 3 mmHg.     Neuro/Psych  PSYCHIATRIC DISORDERS         GI/Hepatic Neg liver ROS,GERD  ,,  Endo/Other  Hypothyroidism    Renal/GU Renal disease     Musculoskeletal  (+) Arthritis ,    Abdominal   Peds  Hematology negative hematology ROS (+)   Anesthesia Other Findings   Reproductive/Obstetrics                             Anesthesia Physical Anesthesia Plan  ASA: 3  Anesthesia Plan: General   Post-op Pain Management:    Induction: Intravenous  PONV Risk Score and Plan: 4 or greater and Ondansetron and Treatment may vary due to age or medical  condition  Airway Management Planned: Oral ETT  Additional Equipment: None  Intra-op Plan:   Post-operative Plan: Extubation in OR  Informed Consent: I have reviewed the patients History and Physical, chart, labs and discussed the procedure including the risks, benefits and alternatives for the proposed anesthesia with the patient or authorized representative who has indicated his/her understanding and acceptance.       Plan Discussed with: CRNA  Anesthesia Plan Comments:         Anesthesia Quick Evaluation

## 2022-07-18 NOTE — Transfer of Care (Signed)
Immediate Anesthesia Transfer of Care Note  Patient: Jacqueline [name]  Procedure(s) Performed: INTRAMEDULLARY (IM) NAIL, HIP (Right)  Patient Location: PACU  Anesthesia Type:General  Level of Consciousness: drowsy and patient cooperative  Airway & Oxygen Therapy: Patient Spontanous Breathing and Patient connected to nasal cannula oxygen  Post-op Assessment: Report given to RN and Post -op Vital signs reviewed and stable  Post vital signs: Reviewed and stable  Last Vitals:  Vitals Value Taken Time  BP    Temp    Pulse    Resp    SpO2      Last Pain:  Vitals:   07/18/22 1135  TempSrc:   PainSc: 10-Worst pain ever      Patients Stated Pain Goal: 3 (05/25/12 1438)  Complications: No notable events documented.

## 2022-07-18 NOTE — Progress Notes (Signed)
[place] for apixaban restart Indication: atrial fibrillation  Allergies  Allergen Reactions   Atorvastatin Other (See Comments)    Myalgias in legs   Beta Adrenergic Blockers Other (See Comments)    Severe bradycardia   Crestor [Rosuvastatin] Other (See Comments)    Myalgias in legs   Morphine And Related Other (See Comments)    "Crazy thoughts, severe headache, sick"   Prednisone     On two occasions has caused A fib    Patient Measurements: Height: 5' 0.98" (154.9 cm) Weight: 52.7 kg (116 lb 2.9 oz) IBW/kg (Calculated) : 47.76 Heparin Dosing Weight: 52.7 kg   Vital Signs: Temp: 99.2 F (37.3 C) (12/23 1535) Temp Source: Oral (12/23 1121) BP: 149/92 (12/23 1545) Pulse Rate: 62 (12/23 1545)  Labs: Recent Labs    07/17/22 1856 07/18/22 0225  HGB 10.1* 8.7*  HCT 30.8* 27.1*  PLT 170 156  CREATININE 1.37* 1.34*    Estimated Creatinine Clearance: 21.5 mL/min (A) (by C-G formula based on SCr of 1.34 mg/dL (H)).   Medical History: Past Medical History:  Diagnosis Date   Arthritis    back   Atrial fibrillation with rapid ventricular response ([place]) 02/2014   a. CHA2DS2VASc = 6 -> on eliquis;  b. 02/2014 s/p DCCV;  c. 08/2014 Echo: EF 55-60%, Gr 2 DD, mild MR, triv AI.   CAD (coronary artery disease)    a. s/p CABG x 2 (LIMA->LAD, VG->Diag);  b. 08/2014 Cath: LM nl, LAD 90p, LCX nl, RCA nl/dominant, LIMA->LAD atretic, VG->D2 patent w/ retrograde filling of LAD, EF 55-60%-->Med Rx.   Diverticulitis 02/21/2012   GERD (gastroesophageal reflux disease)    Hyperlipemia    Hypertension    Insomnia     Medications:  Scheduled:   [MAR Hold] acetaminophen  1,000 mg Oral Q6H   [MAR Hold] ALPRAZolam  0.5 mg Oral QHS   [MAR Hold] amiodarone  200 mg Oral Daily   chlorhexidine  60 mL Topical Once   [MAR Hold] cholecalciferol  1,000 Units Oral Daily   [MAR Hold] cycloSPORINE  1 drop Both Eyes BID   [MAR Hold] feeding supplement  1  Container Oral TID BM   fentaNYL       fentaNYL       [MAR Hold] gabapentin  100 mg Oral TID   [MAR Hold] levothyroxine  75 mcg Oral Q0600   [MAR Hold] lidocaine  1 patch Transdermal QHS   [MAR Hold] multivitamin with minerals  1 tablet Oral Daily   [MAR Hold] pantoprazole  40 mg Oral Daily   [MAR Hold] polyvinyl alcohol  1 drop Both Eyes QID   [MAR Hold] pravastatin  40 mg Oral q1800   [MAR Hold] rOPINIRole  0.25 mg Oral TID    Assessment: 19 yof presenting for IM nail after fall and suffered a hip fracture.   Hgb 8.7, plt 156 this morning. Pharmacy consulted to restart PTA direct oral anticoagulant (DOAC) on POD1 pending AM hemoglobin being stable at 8 and not requiring blood transfusion. On apixaban PTA for hx Afib - given age>80 and weight< 60 kg (Scr <1.5) - qualifies for 2.5 mg BID dose.   Goal of Therapy:  Monitor platelets by anticoagulation protocol: Yes   Plan:  Will monitor Hgb and if requiring any transfusion - if stable will restart apixaban 2.5 mg BID on 12/24 AM.   Antonietta Jewel, PharmD, BCCCP Clinical Pharmacist  Phone: [phone] 07/18/2022 4:12 PM  Please check AMION for  all [place] phone numbers After 10:00 PM, call [place] [phone]

## 2022-07-18 NOTE — Assessment & Plan Note (Addendum)
S/p repair of R femur fracture, day #4. Significant pain with movement limiting rehabilitation effort. Unable to work with PT yesterday due to pain. Discussed timing oxycodone prior to movement since pain is mostly controlled at rest. Denies recent BM, will continue current therapy and escalate tomorrow if no progress. Would like to go to SNF for continued recovery -[facility] signed off, will f/u outpatient in 2 weeks -Pain control: Tylenol 1000 mg scheduled every 6 hours, oxycodone 2.5-5 mg q4h prn, Roxiban '1000mg'$  q6h prn -Bowel regimen: Miralax BID, Senna BID -PT/OT following -Continue Eliquis

## 2022-07-18 NOTE — Progress Notes (Signed)
Daily Progress Note Intern Pager: [phone]  Patient name: Jacqueline [name] Medical record number: [id number] Date of birth: 1930/08/26 Age: 88 y.o. Gender: female  Primary Care Provider: [name], MD Consultants: Orthopedic Surgery  Code Status: Full  Pt Overview and Major Events to Date:  07/17/2022 - Admitted  07/18/2022 - Intramedullary nail placement, hip   Assessment and Plan: Jacqueline [name] is a 88 y.o. female presenting after a fall resulting in right hip fracture with plan for surgery today.   Past medical history significant for history of Osteoporosis with pelvic fracture in September, paroxysmal A-fib on Eliquis, CABG, hypertension, hypothyroidism, CKD stage III, tachybradycardia syndrome, HFpEF, pulmonary arterial hypertension.   * Femur fracture ([place]) Mildly distracted fracture of right intertrochanteric femur from mechanical fall. Hgb decreased from 10.1 to 8.7. Patient has some increased swelling without overlying ecchymosis. Most likely has hematoma around fracture and will be addressed during surgery.   - [facility] consulted, appreciate recs plan for surgery at 1445 today  - Resume VTE ppx after surgery, restart diet after surgery  - Pain control: Tylenol 1000 mg scheduled every 6 hours, fentanyl 12.5 mcg every 4 hours as needed  -As needed MiraLAX and senna  -Avoid NSAIDs as patient has CKD 3 - Continue home vitamin D 1000 mg daily - AM CBC - PT and OT    Chronic Conditions A-fib: Continue amiodarone 200 mg daily Hypertension: Continue hydralazine 37.5 mg 3 times daily Hyperlipidemia: Continue pravastatin 40 mg daily Hypothyroidism: Continue Synthroid 75 mcg daily GERD: Continue Protonix 40 mg daily Neuropathy: Continue gabapentin 100 mg 3 times daily Anxiety: Continue home Xanax 0.5 mg daily at bedtime Parkinsonism/restless leg syndrome: Continue ropinirole 0.25 mg 3 times daily  FEN/GI: NPO for surgery today  PPx: Holding for surgery  today  Dispo:Pending PT evaluation and clinical improvement.   Subjective:  Patient reports she is in great deal of pain from right hip. She denies more swelling at right hip. Denies SOB, lightheadedness, or bowel movement since yesterday.   Objective: Temp:  [98.2 F (36.8 C)-98.4 F (36.9 C)] 98.4 F (36.9 C) (12/23 0425) Pulse Rate:  [54-65] 60 (12/23 0425) Resp:  [15-20] 18 (12/23 0425) BP: (109-162)/(50-66) 109/50 (12/23 0425) SpO2:  [94 %-98 %] 97 % (12/23 0425) Weight:  [52.7 kg] 52.7 kg (12/22 1414) Physical Exam: General: Uncomfortable appearing, frail Cardiovascular: RRR, pulses palpable and equal, both feet appropriately warm to tough  Respiratory: Normal work of breathing on room air Abdomen: Soft, non tender, non distended MSK: R hip pain to palpation esp posterior region, slightly more edematous to prior, no overlying ecchymosis   Laboratory: Most recent CBC Lab Results  Component Value Date   WBC 5.2 07/18/2022   HGB 8.7 (L) 07/18/2022   HCT 27.1 (L) 07/18/2022   MCV 100.0 07/18/2022   PLT 156 07/18/2022   Most recent BMP    Latest Ref Rng & Units 07/18/2022    2:25 AM  BMP  Glucose 70 - 99 mg/dL 115   BUN 8 - 23 mg/dL 18   Creatinine 0.44 - 1.00 mg/dL 1.34   Sodium 135 - 145 mmol/L 141   Potassium 3.5 - 5.1 mmol/L 4.3   Chloride 98 - 111 mmol/L 112   CO2 22 - 32 mmol/L 25   Calcium 8.9 - 10.3 mg/dL 8.9    Lowry Ram, MD 07/18/2022, 7:48 AM  PGY-1, [place] Intern pager: [phone], text pages welcome Secure chat group  [facility] Teaching Service

## 2022-07-18 NOTE — Consult Note (Signed)
ORTHOPAEDIC CONSULTATION  REQUESTING PHYSICIAN: Pray, Norwood Levo, MD  Chief Complaint: right hip pain after fracture  HPI: Jacqueline [name] is a 88 y.o. female who complains of  right hip pain after falling at home. She was walking down the stairs and missed the last step. She had pain and difficulty bearing weight.   Imaging shows a mildly distracted fracture of the intertrochanteric right femur. Healing fractures of the superior and inferior right pubic rami.   Orthopedics was consulted for evaluation.   + h/o MI and CABG in 2006. No history of CVA, DVT, PE.  Previously ambulatory with the use of walker and cane.  The patient is living at home alone.    Past Medical History:  Diagnosis Date   Arthritis    back   Atrial fibrillation with rapid ventricular response ([place]) 02/2014   a. CHA2DS2VASc = 6 -> on eliquis;  b. 02/2014 s/p DCCV;  c. 08/2014 Echo: EF 55-60%, Gr 2 DD, mild MR, triv AI.   CAD (coronary artery disease)    a. s/p CABG x 2 (LIMA->LAD, VG->Diag);  b. 08/2014 Cath: LM nl, LAD 90p, LCX nl, RCA nl/dominant, LIMA->LAD atretic, VG->D2 patent w/ retrograde filling of LAD, EF 55-60%-->Med Rx.   Diverticulitis 02/21/2012   GERD (gastroesophageal reflux disease)    Hyperlipemia    Hypertension    Insomnia    Past Surgical History:  Procedure Laterality Date   ABDOMINAL HYSTERECTOMY  1975   CARDIAC CATHETERIZATION N/A 05/09/2015   Procedure: Right Heart Cath;  Surgeon: Larey Dresser, MD;  Location: [place] [facility];  Service: Cardiovascular;  Laterality: N/A;   CARDIOVERSION N/A 02/28/2014   Procedure: CARDIOVERSION;  Surgeon: Sanda Klein, MD;  Location: [facility];  Service: Cardiovascular;  Laterality: N/A;   CARDIOVERSION N/A 01/30/2019   Procedure: CARDIOVERSION;  Surgeon: Fay Records, MD;  Location: [place];  Service: Cardiovascular;  Laterality: N/A;   CATARACT EXTRACTION Bilateral 2014   with lens implanted   COLONOSCOPY N/A 02/19/2015    Procedure: COLONOSCOPY;  Surgeon: Ladene Artist, MD;  Location: [facility];  Service: Endoscopy;  Laterality: N/A;   CORONARY ARTERY BYPASS GRAFT  2006   off-pump bypass surgery with LIMA to the LAD and SVG to the second diagonal artery ( performed by Dr Servando Snare)    [place] Bilateral    L-2004, R-2006   LEFT HEART CATHETERIZATION WITH CORONARY/GRAFT ANGIOGRAM N/A 09/17/2014   Procedure: LEFT HEART CATHETERIZATION WITH Beatrix Fetters;  Surgeon: Troy Sine, MD;  Location: [facility] CATH LAB;  Service: Cardiovascular;  Laterality: N/A;   REIMPLANTATION OF TOTAL KNEE Right 10/08/2014   Procedure: REIMPLANTATION OF RIGHT TOTAL KNEE ARTHROPLASTY WITH REMOVAL OF ANTIBIOTIC SPACER;  Surgeon: Paralee Cancel, MD;  Location: [facility];  Service: Orthopedics;  Laterality: Right;   ROTATOR CUFF REPAIR Bilateral    r-1999, l- 2005   TEE WITHOUT CARDIOVERSION N/A 02/28/2014   Procedure: TRANSESOPHAGEAL ECHOCARDIOGRAM (TEE);  Surgeon: Sanda Klein, MD;  Location: [facility]COPY;  Service: Cardiovascular;  Laterality: N/A;   TEE WITHOUT CARDIOVERSION N/A 01/30/2019   Procedure: TRANSESOPHAGEAL ECHOCARDIOGRAM (TEE);  Surgeon: Fay Records, MD;  Location: [facility] ENDOSCOPY;  Service: Cardiovascular;  Laterality: N/A;   TOTAL KNEE ARTHROPLASTY Right 07/02/2014   Procedure: Resection of Infected Right Total Knee Arthroplasty with placement antibiotic spacer;  Surgeon: Mauri Pole, MD;  Location: [facility];  Service: Orthopedics;  Laterality: Right;   Social History   Socioeconomic History   Marital  status: Widowed    Spouse name: Not on file   Number of children: 2   Years of education: 10   Highest education level: 10th grade  Occupational History   Not on file  Tobacco Use   Smoking status: Never   Smokeless tobacco: Never  Vaping Use   Vaping Use: Never used  Substance and Sexual Activity   Alcohol use: No   Drug use: No   Sexual activity: Not Currently    Birth control/protection:  Post-menopausal  Other Topics Concern   Not on file  Social History Narrative   Current Social History 05/11/2017           Patient lives alone in one level home    Transportation: Patient has own vehicle and drives herself    Important Relationships Children, grandchildren, Designer, industrial/product and daughter-in-law are all nearby   Pets: None 05/11/2017   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 Drinks water and one cup of coffee daily.   Religious / Personal Beliefs: Baptist. "I believe Jesus died on the cross to save me [id number] my sins. I am saved by the grace of God."    Other: "I have a good life and am happy. My family is there when I need help."                                                              Social Determinants of Health   Financial Resource Strain: Low Risk  (07/18/2018)   Overall Financial Resource Strain (CARDIA)    Difficulty of Paying Living Expenses: Not very hard  Food Insecurity: No Food Insecurity (07/18/2022)   Hunger Vital Sign    Worried About Running Out of Food in the Last Year: Never true    Ran Out of Food in the Last Year: Never true  Transportation Needs: No Transportation Needs (07/18/2022)   PRAPARE - Hydrologist (Medical): No    Lack of Transportation (Non-Medical): No  Physical Activity: Insufficiently Active (07/18/2018)   Exercise Vital Sign    Days of Exercise per Week: 7 days    Minutes of Exercise per Session: 10 min  Stress: No Stress Concern Present (07/18/2018)   [place]    Feeling of Stress : Not at all  Social Connections: Somewhat Isolated (07/18/2018)   Social Connection and Isolation Panel [NHANES]    Frequency of Communication with Friends and Family: More than three times a week     Frequency of Social Gatherings with Friends and Family: More than three times a week    Attends Religious Services: More than 4 times per year    Active Member of [employer] or Organizations: No    Attends Archivist Meetings: Never    Marital Status: Widowed   Family History  Problem Relation Age of Onset   Parkinson's disease Mother    Heart attack Brother    Arthritis Brother    Cancer Brother        Unknown   Breast cancer Neg Hx    Allergies  Allergen Reactions   Atorvastatin Other (See Comments)    Myalgias in legs   Beta Adrenergic Blockers Other (See Comments)    Severe bradycardia   Crestor [Rosuvastatin] Other (See Comments)    Myalgias in legs   Morphine And Related Other (See Comments)    "Crazy thoughts, severe headache, sick"   Prednisone     On two occasions has caused A fib   Prior to Admission medications   Medication Sig Start Date End Date Taking? Authorizing Provider  acetaminophen (TYLENOL) 500 MG tablet Take 1,000 mg by mouth every 6 (six) hours as needed for moderate pain or headache (pain/headache.).   Yes [provider]  amiodarone (PACERONE) 200 MG tablet TAKE 1 TABLET BY MOUTH EVERY DAY 06/08/22  Yes Pray, Norwood Levo, MD  hydrALAZINE (APRESOLINE) 25 MG tablet TAKE 1 AND 1/2 TABLETS (37.5 MG) BY MOUTH 3 TIMES DAILY 03/30/22  Yes Croitoru, Mihai, MD  traMADol (ULTRAM) 50 MG tablet Take 1 tablet (50 mg total) by mouth every 8 (eight) hours as needed. 03/02/22  Yes Hensel, Jamal Collin, MD  ALPRAZolam Duanne Moron) 0.5 MG tablet TAKE 1 TABLET BY MOUTH AT BEDTIME. 06/29/22   Hensel, Jamal Collin, MD  apixaban (ELIQUIS) 2.5 MG TABS tablet Take 1 tablet (2.5 mg total) by mouth 2 (two) times daily. 06/17/21   Warren Lacy, PA-C  cholecalciferol (VITAMIN D3) 25 MCG (1000 UNIT) tablet Take 1,000 Units by mouth daily.    [provider]  cycloSPORINE (RESTASIS) 0.05 % ophthalmic emulsion Place 1 drop into both eyes 2 (two) times daily.    [provider]  gabapentin (NEURONTIN) 100 MG capsule TAKE 1 CAPSULE (100 MG TOTAL) BY MOUTH THREE TIMES DAILY. 05/30/22   Lenoria Chime, MD  levothyroxine (SYNTHROID) 75 MCG tablet TAKE 1 TABLET BY MOUTH EVERY DAY 03/31/22   [name], MD  midodrine (PROAMATINE) 2.5 MG tablet TAKE 1 TABLET BY MOUTH IN MORNING AND 1 TAB 6 HOURS LATER, DO NOT LAY FLAT FOR 4 HOURS AFTER TAKING THE MEDICATION. DO NOT TAKE FOR A SYSTOLIC OF 505 OF ABOVE. Patient not taking: Reported on 07/14/2022 02/28/21   Croitoru, Dani Gobble, MD  Multiple Vitamin (MULTIVITAMIN WITH MINERALS) TABS tablet Take 1 tablet by mouth daily.    [provider]  nitroGLYCERIN (NITROSTAT) 0.4 MG SL tablet Place 1 tablet (0.4 mg total) under the tongue every 5 (five) minutes as needed for chest pain. 12/03/21   Croitoru, Mihai, MD  Omega-3 Fatty Acids (FISH OIL) 1000 MG CAPS Take 1,000 mg by mouth every evening.    [provider]  omeprazole (PRILOSEC) 20 MG capsule TAKE 1 CAPSULE BY MOUTH EVERY DAY 01/05/22   Hensel, Jamal Collin, MD  Polyethyl Glycol-Propyl Glycol (LUBRICANT EYE DROPS) 0.4-0.3 % SOLN Place 1 drop into both eyes 4 (four) times daily.    [provider]  polyethylene glycol (MIRALAX / GLYCOLAX) 17 g packet Take 17 g by mouth 2 (two) times daily. Patient not taking: Reported on 07/14/2022 02/13/22   Bonnielee Haff, MD  pravastatin (PRAVACHOL) 40 MG tablet TAKE 1 TABLET BY MOUTH EVERY DAY IN THE EVENING 05/26/22   Croitoru, Mihai, MD  rOPINIRole (REQUIP) 0.25 MG tablet TAKE 1 TABLET BY MOUTH THREE TIMES A DAY 06/25/22   [name], MD  senna-docusate (SENOKOT-S) 8.6-50 MG tablet Take 2 tablets by mouth 2 (two) times daily. Patient not taking: Reported on 07/14/2022 02/13/22   Bonnielee Haff, MD  traZODone (DESYREL) 100 MG tablet  TAKE 1 TABLET BY MOUTH EVERYDAY AT BEDTIME Patient taking differently: Take 100 mg by mouth at bedtime. 09/10/21   [name], MD   DG Pelvis 1-2 Views  Result  Date: 07/17/2022 CLINICAL DATA:  Fall with right hip pain EXAM: PELVIS - 1 VIEW; RIGHT KNEE - 3 VIEW; RIGHT FEMUR 2 VIEWS COMPARISON:  Right hip radiograph dated 02/09/2022 FINDINGS: Mildly distracted fracture of the intertrochanteric right femur. Callus formation involving the superior and inferior right pubic rami in keeping with healing fractures. Status post right knee arthroplasty. Hardware appears intact without abnormal surrounding lucency. IMPRESSION: 1. Mildly distracted fracture of the intertrochanteric right femur. 2. Healing fractures of the superior and inferior right pubic rami. Electronically Signed   By: Darrin Nipper M.D.   On: 07/17/2022 16:32   DG Femur Min 2 Views Right  Result Date: 07/17/2022 CLINICAL DATA:  Fall with right hip pain EXAM: PELVIS - 1 VIEW; RIGHT KNEE - 3 VIEW; RIGHT FEMUR 2 VIEWS COMPARISON:  Right hip radiograph dated 02/09/2022 FINDINGS: Mildly distracted fracture of the intertrochanteric right femur. Callus formation involving the superior and inferior right pubic rami in keeping with healing fractures. Status post right knee arthroplasty. Hardware appears intact without abnormal surrounding lucency. IMPRESSION: 1. Mildly distracted fracture of the intertrochanteric right femur. 2. Healing fractures of the superior and inferior right pubic rami. Electronically Signed   By: Darrin Nipper M.D.   On: 07/17/2022 16:32   DG Knee 2 Views Right  Result Date: 07/17/2022 CLINICAL DATA:  Fall with right hip pain EXAM: PELVIS - 1 VIEW; RIGHT KNEE - 3 VIEW; RIGHT FEMUR 2 VIEWS COMPARISON:  Right hip radiograph dated 02/09/2022 FINDINGS: Mildly distracted fracture of the intertrochanteric right femur. Callus formation involving the superior and inferior right pubic rami in keeping with healing fractures. Status post right knee arthroplasty. Hardware appears intact without abnormal surrounding lucency. IMPRESSION: 1. Mildly distracted fracture of the intertrochanteric right femur. 2.  Healing fractures of the superior and inferior right pubic rami. Electronically Signed   By: Darrin Nipper M.D.   On: 07/17/2022 16:32    Positive ROS: All other systems have been reviewed and were otherwise negative with the exception of those mentioned in the HPI and as above.  Objective: Labs cbc Recent Labs    07/17/22 1856 07/18/22 0225  WBC 6.9 5.2  HGB 10.1* 8.7*  HCT 30.8* 27.1*  PLT 170 156    Labs inflam No results for input(s): "CRP" in the last 72 hours.  Invalid input(s): "ESR"  Labs coag No results for input(s): "INR", "PTT" in the last 72 hours.  Invalid input(s): "PT"  Recent Labs    07/17/22 1856 07/18/22 0225  NA 141 141  K 3.6 4.3  CL 112* 112*  CO2 21* 25  GLUCOSE 93 115*  BUN 17 18  CREATININE 1.37* 1.34*  CALCIUM 8.9 8.9    Physical Exam: Vitals:   07/18/22 0750 07/18/22 0755  BP: (!) 110/49   Pulse: (!) 57   Resp: 15   Temp: 98.7 F (37.1 C)   SpO2: (!) 77% 94%   General: Alert, no acute distress.  Laying in bed, calm Mental status: Alert and Oriented x3 Neurologic: Speech Clear and organized, no gross focal findings or movement disorder appreciated. Respiratory: No cyanosis, no use of accessory musculature Cardiovascular: No pedal edema GI: Abdomen is soft and non-tender, non-distended. Skin: Warm and dry.   Extremities: Warm and well perfused w/o edema Psychiatric: Patient is competent  for consent with normal mood and affect  MUSCULOSKELETAL:  TTP right hip and thigh, minimal ROM tolerated, intact ROM and ankle, NVI Other extremities are atraumatic with painless ROM and NVI.  Assessment / Plan: Principal Problem:   Femur fracture ([place])   Will take patient to the OR today for placement of a right femur IMN. Keep NPO.  For now Weightbearing: NWB RLE Pain control: limit narcotics Contact information:  Edmonia Lynch MD, [facility] PA-C    Britt Bottom PA-C Office [phone] 07/18/2022 9:51 AM

## 2022-07-18 NOTE — Anesthesia Postprocedure Evaluation (Signed)
Anesthesia Post Note  Patient: Jacqueline [name]  Procedure(s) Performed: INTRAMEDULLARY (IM) NAIL, HIP (Right)     Patient location during evaluation: PACU Anesthesia Type: General Level of consciousness: awake Pain management: pain level controlled Vital Signs Assessment: post-procedure vital signs reviewed and stable Respiratory status: spontaneous breathing Cardiovascular status: stable Postop Assessment: no apparent nausea or vomiting Anesthetic complications: no   No notable events documented.  Last Vitals:  Vitals:   07/18/22 1535 07/18/22 1545  BP: (!) 159/63   Pulse: 66   Resp: 20 (!) 26  Temp: 37.3 C   SpO2: 95%     Last Pain:  Vitals:   07/18/22 1535  TempSrc:   PainSc: 10-Worst pain ever        RLE Motor Response: Purposeful movement (07/18/22 1535) RLE Sensation: Full sensation (07/18/22 1535)      Gentri Guardado

## 2022-07-18 NOTE — Op Note (Signed)
DATE OF SURGERY:  07/18/2022  TIME: 3:15 PM  PATIENT NAME:  Jacqueline [name]  AGE: 88 y.o.  PRE-OPERATIVE DIAGNOSIS:  RIGHT HIP FX  POST-OPERATIVE DIAGNOSIS:  SAME  PROCEDURE:  INTRAMEDULLARY (IM) NAIL, HIP  SURGEON:  Renette Butters  ASSISTANT:  Aggie Moats, PA-C, he was present and scrubbed throughout the case, critical for completion in a timely fashion, and for retraction, instrumentation, and closure.   OPERATIVE IMPLANTS: [employer] Gamma Nail short  PREOPERATIVE INDICATIONS:  Jacqueline [name] is a 88 y.o. year old who fell and suffered a hip fracture. She was brought into the ER and then admitted and optimized and then elected for surgical intervention.    The risks benefits and alternatives were discussed with the patient including but not limited to the risks of nonoperative treatment, versus surgical intervention including infection, bleeding, nerve injury, malunion, nonunion, hardware prominence, hardware failure, need for hardware removal, blood clots, cardiopulmonary complications, morbidity, mortality, among others, and they were willing to proceed.    OPERATIVE PROCEDURE:  The patient was brought to the operating room and placed in the supine position. General anesthesia was administered. She was placed on the fracture table.  Closed reduction was performed under C-arm guidance. Time out was then performed after sterile prep and drape. She received preoperative antibiotics.  I first performed a closed manipulation of the fracture to reduce it on the OR table.  Incision was made proximal to the greater trochanter. A guidewire was placed in the appropriate position. Confirmation was made on AP and lateral views. The above-named nail was opened. I opened the proximal femur with a reamer. I then placed the nail by hand easily down. I did not need to ream the femur.  Once the nail was completely seated, I placed a guidepin into the femoral head into the center center  position. I measured the length, and then reamed the lateral cortex and up into the head. I then placed the lag screw. Slight compression was applied. Anatomic fixation achieved.  I then secured the proximal interlocking bolt, and took off a half a turn, and then removed the instruments, and took final C-arm pictures AP and lateral the entire length of the leg.  I then placed a distal interlock screw.   Anatomic reconstruction was achieved, and the wounds were irrigated copiously and closed with a layered closure. The patient was awakened and returned to PACU in stable and satisfactory condition. There no complications and the patient tolerated the procedure well.  She will be weightbearing as tolerated, and will be on chemical px  for a period of four weeks after discharge.   Edmonia Lynch, M.D.

## 2022-07-18 NOTE — Plan of Care (Signed)

## 2022-07-18 NOTE — Progress Notes (Signed)
PT Cancellation Note  Patient Details Name: Jacqueline [name] MRN: [id number] DOB: 08-06-1930   Cancelled Treatment:    Reason Eval/Treat Not Completed: Medical issues which prohibited therapy.  Awaiting surgery for distracted R femoral fracture.  Follow up as MD indicates.   Ramond Dial 07/18/2022, 10:56 AM  Mee Hives, PT PhD Acute Rehab Dept. Number: [place] and [place]

## 2022-07-18 NOTE — Hospital Course (Signed)
Stephaine Breshears is a 88 y.o. female who was admitted with right femur fracture s/p mechanical fall. PMH significant for paroxysmal a-fib on Eliquis, CABG, HTN, CKD 3b, PAH, and HFpEF.  Right Femur Fracture Mildly displaced right intertrochanteric femur fracture seen on x-ray. Underwent ORIF on 12/23. Received 1u pRBC for postoperative anemia. Hgb remained stable after this***. Postoperatively, pain was well controlled. She was evaluated by PT/OT and determined need to go to *** (SNF/Home Health).  Sent home with *** for pain control.  Chronic Conditions A-fib: Continued amiodarone 200 mg daily HTN: Amlodipine 5 mg daily Hyperlipidemia: Continued pravastatin 40 mg daily Hypothyroidism: Continued Synthroid 75 mcg daily GERD: Continued Protonix 40 mg daily Neuropathy: Continued gabapentin 100 mg 3 times daily Anxiety: Continued home Xanax 0.5 mg daily at bedtime Parkinsonism/restless leg syndrome: Continued ropinirole 0.25 mg 3 times daily   Follow up: F/u CBC - pt required 1u pRBC transfusion during her stay

## 2022-07-18 NOTE — Anesthesia Procedure Notes (Signed)
Procedure Name: Intubation Date/Time: 07/18/2022 2:33 PM  Performed by: Eligha Bridegroom, CRNAPre-anesthesia Checklist: Emergency Drugs available, Patient identified, Suction available, Patient being monitored and Timeout performed Patient Re-evaluated:Patient Re-evaluated prior to induction Oxygen Delivery Method: Circle system utilized Preoxygenation: Pre-oxygenation with 100% oxygen Induction Type: IV induction Ventilation: Mask ventilation without difficulty Laryngoscope Size: Mac and 3 Grade View: Grade I Tube type: Oral Tube size: 7.0 mm Number of attempts: 1 Airway Equipment and Method: Stylet Placement Confirmation: ETT inserted through vocal cords under direct vision, positive ETCO2 and breath sounds checked- equal and bilateral Secured at: 21 cm Dental Injury: Teeth and Oropharynx as per pre-operative assessment

## 2022-07-18 NOTE — Discharge Instructions (Signed)
POST-OPERATIVE OPIOID TAPER INSTRUCTIONS: ?It is important to wean off of your opioid medication as soon as possible. If you do not need pain medication after your surgery it is ok to stop day one. ?Opioids include: ?Codeine, Hydrocodone(Norco, Vicodin), Oxycodone(Percocet, oxycontin) and hydromorphone amongst others.  ?Long term and even short term use of opiods can cause: ?Increased pain response ?Dependence ?Constipation ?Depression ?Respiratory depression ?And more.  ?Withdrawal symptoms can include ?Flu like symptoms ?Nausea, vomiting ?And more ?Techniques to manage these symptoms ?Hydrate well ?Eat regular healthy meals ?Stay active ?Use relaxation techniques(deep breathing, meditating, yoga) ?Do Not substitute Alcohol to help with tapering ?If you have been on opioids for less than two weeks and do not have pain than it is ok to stop all together.  ?Plan to wean off of opioids ?This plan should start within one week post op of your joint replacement. ?Maintain the same interval or time between taking each dose and first decrease the dose.  ?Cut the total daily intake of opioids by one tablet each day ?Next start to increase the time between doses. ?The last dose that should be eliminated is the evening dose.   ?

## 2022-07-18 NOTE — Progress Notes (Addendum)
FMTS Interim Progress Note  S: Patient assessed at bedside with Dr. Rock Nephew after repair of R hip fracture. Patient bundled in multiple blankets and states she is still recovering from the anesthesia. States she needs something for pain as it is pretty intense. Denies nausea but states she is not interested in eating dinner. Denies passing gas or BM. Urinating without concern. Requested I speak to her son Marguerite Olea.  Called son Marguerite Olea and updated him on patient's surgery. States he will be by to visit her soon.  O: BP (!) 158/53 (BP Location: Right Arm)   Pulse 69   Temp 99 F (37.2 C)   Resp 18   Ht 5' 0.98" (1.549 m)   Wt 52.7 kg   SpO2 93%   BMI 21.96 kg/m    General: Elderly female bundled in blankets. Alert. NAD CV: RRR without murmur Pulm: CTAB. Normal WOB on RA. No wheezing Abdomen: Soft, non-tender, non-distended. Ext: Motor and sensation intact in R leg. 2+ dorsalis pedis pulse. Clear, dry bandage over incision.  A/P: Fixation R hip fracture -Pain management: Sch Tylenol '1000mg'$  q6h, Oxycodone 2.5-'5mg'$  prn -Bowel regimen: Miralax and Senna prn -PT consulted, weightbearing as tolerated -Heart healthy diet -On Eliquis for DVT prophylaxis  Colletta Maryland, MD 07/18/2022, 5:12 PM PGY-1, [facility] pager [phone]

## 2022-07-19 LAB — CBC
HCT: 25.7 % — ABNORMAL LOW (ref 36.0–46.0)
Hemoglobin: 8.7 g/dL — ABNORMAL LOW (ref 12.0–15.0)
MCH: 32.5 pg (ref 26.0–34.0)
MCHC: 33.9 g/dL (ref 30.0–36.0)
MCV: 95.9 fL (ref 80.0–100.0)
Platelets: 114 10*3/uL — ABNORMAL LOW (ref 150–400)
RBC: 2.68 MIL/uL — ABNORMAL LOW (ref 3.87–5.11)
RDW: 16 % — ABNORMAL HIGH (ref 11.5–15.5)
WBC: 5.6 10*3/uL (ref 4.0–10.5)
nRBC: 0 % (ref 0.0–0.2)

## 2022-07-19 LAB — CBC WITH DIFFERENTIAL/PLATELET
Abs Immature Granulocytes: 0.01 10*3/uL (ref 0.00–0.07)
Basophils Absolute: 0 10*3/uL (ref 0.0–0.1)
Basophils Relative: 1 %
Eosinophils Absolute: 0.2 10*3/uL (ref 0.0–0.5)
Eosinophils Relative: 4 %
HCT: 20.9 % — ABNORMAL LOW (ref 36.0–46.0)
Hemoglobin: 6.9 g/dL — CL (ref 12.0–15.0)
Immature Granulocytes: 0 %
Lymphocytes Relative: 15 %
Lymphs Abs: 0.7 10*3/uL (ref 0.7–4.0)
MCH: 33 pg (ref 26.0–34.0)
MCHC: 33 g/dL (ref 30.0–36.0)
MCV: 100 fL (ref 80.0–100.0)
Monocytes Absolute: 0.7 10*3/uL (ref 0.1–1.0)
Monocytes Relative: 14 %
Neutro Abs: 3.2 10*3/uL (ref 1.7–7.7)
Neutrophils Relative %: 66 %
Platelets: 121 10*3/uL — ABNORMAL LOW (ref 150–400)
RBC: 2.09 MIL/uL — ABNORMAL LOW (ref 3.87–5.11)
RDW: 14.8 % (ref 11.5–15.5)
WBC: 4.8 10*3/uL (ref 4.0–10.5)
nRBC: 0 % (ref 0.0–0.2)

## 2022-07-19 LAB — PREPARE RBC (CROSSMATCH)

## 2022-07-19 MED ORDER — SODIUM CHLORIDE 0.9% IV SOLUTION: Freq: Once | INTRAVENOUS | Status: DC

## 2022-07-19 MED ORDER — SODIUM CHLORIDE 0.9% IV SOLUTION: Freq: Once | INTRAVENOUS | Status: AC

## 2022-07-19 NOTE — Progress Notes (Incomplete)
     Daily Progress Note Intern Pager: [phone]  Patient name: Jacqueline [name] Medical record number: [id number] Date of birth: September 04, 1930 Age: 88 y.o. Gender: female  Primary Care Provider: [name], MD Consultants: Orthopedic Surgery Code Status: Full  Pt Overview and Major Events to Date:  07/17/2022 - Admitted  07/18/2022 - Intramedullary nail placement, hip  Assessment and Plan:  Jacqueline [name] is a 88 y.o. female presenting after a fall resulting in right hip fracture s/p IMN surgery. Past medical history significant for history of Osteoporosis with pelvic fracture in September, paroxysmal A-fib on Eliquis, CABG, hypertension, hypothyroidism, CKD stage III, tachybradycardia syndrome, HFpEF, pulmonary arterial hypertension.     * Femur fracture ([place]) Mildly distracted fracture of right intertrochanteric femur from mechanical fall. Patient s/p intramedullary nail surgery, PO day 2. Pain well controlled. Yesterday, Hgb drop from 8.7 to 6.9 and transfused 1 u PRBC. Hgb 8.3 today, will restart Eliquis. PT/OT recommending SNF vs Home Health pending on improvement with therapy. -Start eliquis - [facility] following, appreciate recs - Pain control: Tylenol 1000 mg scheduled every 6 hours, oxycodone 2.5-5 mg q4h prn  -As needed MiraLAX and senna  -Avoid NSAIDs as patient has CKD 3 - Continue home vitamin D 1000 mg daily - PT and OT    Chronic Conditions A-fib: Continue amiodarone 200 mg daily HTN: Amlodipine 5 mg daily Hyperlipidemia: Continue pravastatin 40 mg daily Hypothyroidism: Continue Synthroid 75 mcg daily GERD: Continue Protonix 40 mg daily Neuropathy: Continue gabapentin 100 mg 3 times daily Anxiety: Continue home Xanax 0.5 mg daily at bedtime Parkinsonism/restless leg syndrome: Continue ropinirole 0.25 mg 3 times daily    FEN/GI: Heart Healthy Diet PPx: SCD's Dispo:Home with home health vs SNF pending clinical improvement .  Subjective:  Doing well  this morning, but notes she's in some pain. Has not had bowel movement,  but does report passing gas.  Objective: Temp:  [97.8 F (36.6 C)-98.1 F (36.7 C)] 98.1 F (36.7 C) (12/24 2002) Pulse Rate:  [55-62] 55 (12/24 2002) Resp:  [16-19] 16 (12/24 2002) BP: (107-129)/(49-100) 113/49 (12/24 2002) SpO2:  [92 %-100 %] 96 % (12/24 2002) Physical Exam: General: Frail, elderly, NAD, polite Cardiovascular: RRR, NRMG Respiratory: CTABL Abdomen: Soft, NTTP, non-distended, normal bowel sounds Extremities: Clean/dry/intact bandage overlying right hip  Laboratory: Most recent CBC Lab Results  Component Value Date   WBC 4.7 07/20/2022   HGB 8.3 (L) 07/20/2022   HCT 24.4 (L) 07/20/2022   MCV 96.4 07/20/2022   PLT 115 (L) 07/20/2022   Most recent BMP    Latest Ref Rng & Units 07/18/2022    2:25 AM  BMP  Glucose 70 - 99 mg/dL 115   BUN 8 - 23 mg/dL 18   Creatinine 0.44 - 1.00 mg/dL 1.34   Sodium 135 - 145 mmol/L 141   Potassium 3.5 - 5.1 mmol/L 4.3   Chloride 98 - 111 mmol/L 112   CO2 22 - 32 mmol/L 25   Calcium 8.9 - 10.3 mg/dL 8.9     Holley Bouche, MD 07/20/2022, 6:27 AM  PGY-2, [facility] Intern pager: [phone], text pages welcome Secure chat group [place]

## 2022-07-19 NOTE — Evaluation (Signed)
Physical Therapy Evaluation Patient Details Name: Jacqueline [name] MRN: [id number] DOB: 1930-10-16 Today's Date: 07/19/2022  History of Present Illness  Pt is an 88 y.o. female presenting 07/17/22 after a fall and was found to have a R hip fracture. Pt is now s/p intramedullary nailing of the R femur on 12/23. PMH significant for pAfib on eliquis, CABG, HTN, CKD 3b, PAH, HfpEF.   Clinical Impression  Pt presents with condition above and deficits mentioned below, see PT Problem List. PTA, she was mod I using a RW for functional mobility, living alone in a 1-level house with 3 STE. She reports her son and DIL live in a house behind her house and one or the other can be available 24/7 if needed. Currently, pt is requiring extensive physical assistance for all functional mobility, limited by her R hip pain. She required maxAx2 for bed mobility, minAx2 to transfer to stand, and modAx2 to slide her feet or take very small and slow steps a couple times laterally along EOB using a RW. She demonstrates deficits in overall strength, power, activity tolerance, balance, and R hip and knee ROM. At this time, pt is requiring extensive physical assistance that likely would be difficult to manage at home, thus recommending short-term rehab at a SNF. However, if pt makes good progress to a level that her family can safely manage her then she may be able to progress to home with HHPT. Will continue to follow acutely.    BP:  112/46 (65) supine 112/92 (98) sitting 105/76 (86) standing 110/76 (86) supine end of session    Recommendations for follow up therapy are one component of a multi-disciplinary discharge planning process, led by the attending physician.  Recommendations may be updated based on patient status, additional functional criteria and insurance authorization.  Follow Up Recommendations Skilled nursing-short term rehab (<3 hours/day) (vs HHPT pending progress and available assistance) Can patient  physically be transported by private vehicle: No    Assistance Recommended at Discharge Intermittent Supervision/Assistance  Patient can return home with the following  Two people to help with walking and/or transfers;A lot of help with bathing/dressing/bathroom;Assistance with cooking/housework;Assist for transportation;Help with stairs or ramp for entrance    Equipment Recommendations BSC/3in1;Wheelchair (measurements PT);Wheelchair cushion (measurements PT)  Recommendations for Other Services       Functional Status Assessment Patient has had a recent decline in their functional status and demonstrates the ability to make significant improvements in function in a reasonable and predictable amount of time.     Precautions / Restrictions Precautions Precautions: Fall;Other (comment) Precaution Comments: watch BP Restrictions Weight Bearing Restrictions: Yes RLE Weight Bearing: Weight bearing as tolerated      Mobility  Bed Mobility Overal bed mobility: Needs Assistance Bed Mobility: Supine to Sit, Sit to Supine     Supine to sit: Max assist, +2 for safety/equipment, +2 for physical assistance, HOB elevated Sit to supine: Max assist, +2 for physical assistance, +2 for safety/equipment, HOB elevated   General bed mobility comments: Pt cued to hook L foot under R ankle to assist it off L EOB, cuing pt to pull on L bed rail to sit up, needing maxAx2 using bed pad to transition supine > sit L EOB with HOB elevated. Increased time to relax legs to bring feet to ground. MaxAx2 using bed pad to return to supine.    Transfers Overall transfer level: Needs assistance Equipment used: Rolling walker (2 wheels) Transfers: Sit to/from Stand Sit to Stand: Min assist, +2  physical assistance, +2 safety/equipment           General transfer comment: Good initiation noted by pt to come to stand from EOB, cuing pt to place R foot anteriorly for pain management and push up from bed, minAx2  to boost up to stand and steady pt at RW. Pt sliding L foot to step and needing modAx2 to facilitate weight shifting and R lower extremity movement to step along EOB to her L.    Ambulation/Gait Ambulation/Gait assistance: Mod assist, +2 physical assistance, +2 safety/equipment Gait Distance (Feet): 1 Feet Assistive device: Rolling walker (2 wheels) Gait Pattern/deviations: Step-to pattern, Decreased step length - right, Decreased step length - left, Decreased stance time - right, Decreased stride length, Decreased weight shift to right, Knee flexed in stance - right, Trunk flexed, Antalgic Gait velocity: reduced Gait velocity interpretation: <1.31 ft/sec, indicative of household ambulator   General Gait Details: Pt sliding L foot to step and needing modAx2 to facilitate weight shifting and R lower extremity movement to step along EOB to her L.  Stairs            Wheelchair Mobility    Modified Rankin (Stroke Patients Only)       Balance Overall balance assessment: Needs assistance Sitting-balance support: Single extremity supported, Bilateral upper extremity supported, Feet supported Sitting balance-Leahy Scale: Poor Sitting balance - Comments: Reliant on UE support to sit statically EOB, initially requiring maxA due to posterior lean and difficulty relaxing legs to get feet to ground but progressed to min guard-minA Postural control: Posterior lean Standing balance support: Bilateral upper extremity supported, During functional activity, Reliant on assistive device for balance Standing balance-Leahy Scale: Poor Standing balance comment: Reliant on RW and external physical assistance                             Pertinent Vitals/Pain Pain Assessment Pain Assessment: Faces Faces Pain Scale: Hurts whole lot Pain Location: R hip Pain Descriptors / Indicators: Discomfort, Grimacing, Operative site guarding Pain Intervention(s): Limited activity within patient's  tolerance, Monitored during session, Repositioned    Home Living Family/patient expects to be discharged to:: Private residence Living Arrangements: Alone Available Help at Discharge: Family;Available 24 hours/day (son and daughter-in-law live behind her and DIL can help while son is at work) Type of Home: House Home Access: Stairs to enter Entrance Stairs-Rails: None Entrance Stairs-Number of Steps: 3   Home Layout: One level Home Equipment: [place] - single Barista (2 wheels);Toilet riser;Grab bars - tub/shower;Grab bars - toilet;Tub bench;Hand held shower head      Prior Function Prior Level of Function : Independent/Modified Independent             Mobility Comments: does not drive; uses RW all times; fallen 2-3x in past year ADLs Comments: housekeeper for cleaning 2x/month, doesn't drive     Hand Dominance   Dominant Hand: Right    Extremity/Trunk Assessment   Upper Extremity Assessment Upper Extremity Assessment: Defer to OT evaluation    Lower Extremity Assessment Lower Extremity Assessment: RLE deficits/detail RLE Deficits / Details: Limited hip and knee flexion ROM due to R hip pain    Cervical / Trunk Assessment Cervical / Trunk Assessment: Kyphotic  Communication   Communication: No difficulties  Cognition Arousal/Alertness: Awake/alert Behavior During Therapy: WFL for tasks assessed/performed Overall Cognitive Status: Within Functional Limits for tasks assessed  General Comments: Internally distracted by pain, needing cues to sequence mobility, but likely baseline if not limited by pain.        General Comments General comments (skin integrity, edema, etc.): BP: 112/46 (65) supine, 112/92 (98) sitting, 105/76 (86) standing, 110/76 (86) supine end of session; reported mild lightheadedness throughout that reportedly remained constant and consistent    Exercises     Assessment/Plan    PT  Assessment Patient needs continued PT services  PT Problem List Decreased strength;Decreased range of motion;Decreased activity tolerance;Decreased balance;Decreased mobility;Pain       PT Treatment Interventions DME instruction;Gait training;Stair training;Functional mobility training;Therapeutic exercise;Therapeutic activities;Balance training;Neuromuscular re-education;Patient/family education    PT Goals (Current goals can be found in the Care Plan section)  Acute Rehab PT Goals Patient Stated Goal: to go home if able PT Goal Formulation: With patient Time For Goal Achievement: 08/02/22 Potential to Achieve Goals: Fair    Frequency Min 5X/week     Co-evaluation PT/OT/SLP Co-Evaluation/Treatment: Yes Reason for Co-Treatment: For patient/therapist safety;To address functional/ADL transfers PT goals addressed during session: Mobility/safety with mobility;Balance;Proper use of DME         AM-PAC PT "6 Clicks" Mobility  Outcome Measure Help needed turning from your back to your side while in a flat bed without using bedrails?: Total Help needed moving from lying on your back to sitting on the side of a flat bed without using bedrails?: Total Help needed moving to and from a bed to a chair (including a wheelchair)?: Total Help needed standing up from a chair using your arms (e.g., wheelchair or bedside chair)?: Total Help needed to walk in hospital room?: Total Help needed climbing 3-5 steps with a railing? : Total 6 Click Score: 6    End of Session Equipment Utilized During Treatment: Gait belt Activity Tolerance: Patient limited by pain Patient left: in bed;with call bell/phone within reach;with bed alarm set Nurse Communication: Mobility status;Other (comment) (BP) PT Visit Diagnosis: Unsteadiness on feet (R26.81);Other abnormalities of gait and mobility (R26.89);Muscle weakness (generalized) (M62.81);History of falling (Z91.81);Repeated falls (R29.6);Difficulty in walking,  not elsewhere classified (R26.2);Pain Pain - Right/Left: Right Pain - part of body: Hip    Time: 0830-0907 PT Time Calculation (min) (ACUTE ONLY): 37 min   Charges:   PT Evaluation $PT Eval Moderate Complexity: 1 Mod          Moishe Spice, PT, DPT Acute Rehabilitation Services  Office: [phone]   Orvan Falconer 07/19/2022, 9:27 AM

## 2022-07-19 NOTE — Evaluation (Signed)
Occupational Therapy Evaluation Patient Details Name: Jacqueline [name] MRN: [id number] DOB: 1931-06-07 Today's Date: 07/19/2022   History of Present Illness Pt is an 88 y.o. female presenting 07/17/22 after a fall and was found to have a R hip fracture. Pt is now s/p intramedullary nailing of the R femur on 12/23. PMH significant for pAfib on eliquis, CABG, HTN, CKD 3b, PAH, HfpEF.   Clinical Impression   PTA, pt lived alone and was independent in ADL and IADL, but did not drive. Pt reporting son and daughter in [place] live very close and can be available 24/7 if needed. Upon eval, pt limited by her hip pain, requiring up to max A +2 for bed mobility, Max A for LB ADL, and Supervision for UB ADL. Pt required min A +2 for transfer and up to mod A for side steps toward HOB. Currently, pt requiring +2 assist, so recommending ST-SNF. If pt can progress to +1 assist, may be able to progress to [place]. Will continue to follow acutely.      Recommendations for follow up therapy are one component of a multi-disciplinary discharge planning process, led by the attending physician.  Recommendations may be updated based on patient status, additional functional criteria and insurance authorization.   Follow Up Recommendations  Skilled nursing-short term rehab (<3 hours/day) (Hopeful to progress to [facility] with pain management and improved mobility over the next few days)     Assistance Recommended at Discharge Intermittent Supervision/Assistance  Patient can return home with the following A lot of help with walking and/or transfers;A lot of help with bathing/dressing/bathroom;Assistance with cooking/housework;Assist for transportation;Help with stairs or ramp for entrance    Functional Status Assessment  Patient has had a recent decline in their functional status and demonstrates the ability to make significant improvements in function in a reasonable and predictable amount of time.  Equipment Recommendations   None recommended by OT    Recommendations for Other Services       Precautions / Restrictions Precautions Precautions: Fall;Other (comment) Precaution Comments: watch BP Restrictions Weight Bearing Restrictions: Yes RLE Weight Bearing: Weight bearing as tolerated      Mobility Bed Mobility Overal bed mobility: Needs Assistance Bed Mobility: Supine to Sit, Sit to Supine     Supine to sit: Max assist, +2 for safety/equipment, +2 for physical assistance, HOB elevated Sit to supine: Max assist, +2 for physical assistance, +2 for safety/equipment, HOB elevated   General bed mobility comments: Pt cued to hook L foot under R ankle to assist it off L EOB, cuing pt to pull on L bed rail to sit up, needing maxAx2 using bed pad to transition supine > sit L EOB with HOB elevated. Increased time to relax legs to bring feet to ground. MaxAx2 using bed pad to return to supine.    Transfers Overall transfer level: Needs assistance Equipment used: Rolling walker (2 wheels) Transfers: Sit to/from Stand Sit to Stand: Min assist, +2 physical assistance, +2 safety/equipment           General transfer comment: Good initiation noted by pt to come to stand from EOB, cuing pt to place R foot anteriorly for pain management and push up from bed, minAx2 to boost up to stand and steady pt at RW. Pt sliding L foot to step and needing modAx2 to facilitate weight shifting and R lower extremity movement to step along EOB to her L.      Balance Overall balance assessment: Needs assistance Sitting-balance support: Single extremity  supported, Bilateral upper extremity supported, Feet supported Sitting balance-Leahy Scale: Poor Sitting balance - Comments: Reliant on UE support to sit statically EOB, initially requiring maxA due to posterior lean and difficulty relaxing legs to get feet to ground but progressed to min guard-minA Postural control: Posterior lean Standing balance support: Bilateral upper  extremity supported, During functional activity, Reliant on assistive device for balance Standing balance-Leahy Scale: Poor Standing balance comment: Reliant on RW and external physical assistance                           ADL either performed or assessed with clinical judgement   ADL Overall ADL's : Needs assistance/impaired Eating/Feeding: Modified independent   Grooming: Supervision/safety;Sitting Grooming Details (indicate cue type and reason): decr sitting balance noted once coming to EOB and reliance on UE Upper Body Bathing: Supervision/ safety;Sitting   Lower Body Bathing: Maximal assistance;Sit to/from stand   Upper Body Dressing : Supervision/safety;Sitting   Lower Body Dressing: Maximal assistance;Sit to/from stand   Toilet Transfer: Minimal assistance;+2 for physical assistance;+2 for safety/equipment;Stand-pivot;Rolling walker (2 wheels);BSC/3in1;Moderate assistance Toilet Transfer Details (indicate cue type and reason): Min A +2 to rise from EOB; Mod A for weight shifts and RLE progression         Functional mobility during ADLs: Moderate assistance;+2 for physical assistance;+2 for safety/equipment;Rolling walker (2 wheels) General ADL Comments: Mod+2 for steps toward [facility]     Vision Baseline Vision/History: 1 Wears glasses Ability to See in Adequate Light: 0 Adequate Patient Visual Report: No change from baseline Vision Assessment?: No apparent visual deficits     Perception Perception Perception Tested?: No   Praxis Praxis Praxis tested?: Not tested    Pertinent Vitals/Pain Pain Assessment Pain Assessment: Faces Faces Pain Scale: Hurts whole lot Pain Location: R hip Pain Descriptors / Indicators: Discomfort, Grimacing, Operative site guarding Pain Intervention(s): Limited activity within patient's tolerance, Monitored during session, Repositioned, Patient requesting pain meds-RN notified, Relaxation     Hand Dominance Right    Extremity/Trunk Assessment Upper Extremity Assessment Upper Extremity Assessment: Generalized weakness   Lower Extremity Assessment Lower Extremity Assessment: Defer to PT evaluation RLE Deficits / Details: Limited hip and knee flexion ROM due to R hip pain   Cervical / Trunk Assessment Cervical / Trunk Assessment: Kyphotic   Communication Communication Communication: No difficulties   Cognition Arousal/Alertness: Awake/alert Behavior During Therapy: WFL for tasks assessed/performed Overall Cognitive Status: Within Functional Limits for tasks assessed                                 General Comments: Internally distracted by pain, needing cues to sequence mobility, but likely baseline if not limited by pain.     General Comments  BP: 112/46 (65) supine, 112/92 (98) sitting, 105/76 (86) standing, 110/76 (86) supine end of session; reported mild lightheadedness throughout that reportedly remained constant and consistent1    Exercises     Shoulder Instructions      Home Living Family/patient expects to be discharged to:: Private residence Living Arrangements: Alone Available Help at Discharge: Family;Available 24 hours/day (son and daughter-in-law live behind her and DIL can help while son is at work) Type of Home: House Home Access: Stairs to enter Technical brewer of Steps: 3 Entrance Stairs-Rails: None Home Layout: One level     Bathroom Shower/Tub: Teacher, early years/pre: Handicapped height Bathroom Accessibility: Yes   Home  Equipment: Cane - single Barista (2 wheels);Toilet riser;Grab bars - tub/shower;Grab bars - toilet;Tub bench;Hand held shower head          Prior Functioning/Environment Prior Level of Function : Independent/Modified Independent             Mobility Comments: does not drive; uses RW all times; fallen 2-3x in past year ADLs Comments: housekeeper for cleaning 2x/month, doesn't drive         OT Problem List: Decreased strength;Decreased activity tolerance;Impaired balance (sitting and/or standing);Decreased knowledge of use of DME or AE;Decreased knowledge of precautions;Pain      OT Treatment/Interventions: Self-care/ADL training;Therapeutic exercise;DME and/or AE instruction;Therapeutic activities;Patient/family education;Balance training    OT Goals(Current goals can be found in the care plan section) Acute Rehab OT Goals Patient Stated Goal: no pain OT Goal Formulation: With patient Time For Goal Achievement: 08/02/22 Potential to Achieve Goals: Good  OT Frequency: Min 2X/week    Co-evaluation   Reason for Co-Treatment: For patient/therapist safety;To address functional/ADL transfers PT goals addressed during session: Mobility/safety with mobility;Balance;Proper use of DME        AM-PAC OT "6 Clicks" Daily Activity     Outcome Measure Help from another person eating meals?: None Help from another person taking care of personal grooming?: A Little Help from another person toileting, which includes using toliet, bedpan, or urinal?: A Lot Help from another person bathing (including washing, rinsing, drying)?: A Lot Help from another person to put on and taking off regular upper body clothing?: A Little Help from another person to put on and taking off regular lower body clothing?: A Lot 6 Click Score: 16   End of Session Equipment Utilized During Treatment: Gait belt;Rolling walker (2 wheels) Nurse Communication: Mobility status  Activity Tolerance: Patient tolerated treatment well;Patient limited by pain Patient left: in bed;with call bell/phone within reach;with bed alarm set  OT Visit Diagnosis: Unsteadiness on feet (R26.81);Muscle weakness (generalized) (M62.81);Other abnormalities of gait and mobility (R26.89);History of falling (Z91.81);Pain                Time: 0831-0900 OT Time Calculation (min): 29 min Charges:  OT General Charges $OT Visit: 1  Visit OT Evaluation $OT Eval Moderate Complexity: 1 Mod  Elder Cyphers, OTR/L [facility] Acute Rehabilitation Office: [phone]   Magnus Ivan 07/19/2022, 10:09 AM

## 2022-07-19 NOTE — Progress Notes (Signed)
   ORTHOPAEDIC PROGRESS NOTE  s/p Procedure(s): INTRAMEDULLARY (IM) NAIL, HIP on 07/18/2022 with Dr. Percell Miller  SUBJECTIVE: Patient having moderate pain in her right hip. She worked with therapy this AM, but she did not get out of bed. Will be received pRBCs. She feels tired. Family member at bedside.   No chest pain. No SOB. No nausea/vomiting. No other complaints.  OBJECTIVE: PE: General: resting comfortably in hospital bed, NAD RLE: proximal dressing with a small amount of strike-through noted, dressing removed, incision CDI, new mepilex placed,  intact EHL/TA/GSC, endorses distal sensation,  leg lengths equal, warm well perfused foot   Vitals:   07/18/22 1630 07/18/22 2156  BP: (!) 158/53 (!) 106/42  Pulse: 69 (!) 57  Resp: 18 14  Temp: 99 F (37.2 C) 98.3 F (36.8 C)  SpO2: 93% 97%     ASSESSMENT: Jacqueline [name] is a 88 y.o. female POD#1  PLAN: Weightbearing: WBAT RLE Insicional and dressing care: Reinforce dressings as needed Orthopedic device(s): None Showering: Hold for now VTE prophylaxis: Lovenox once Hgb improves.  ABLA: Hgb 6.9 this AM. Patient received pRBCs Pain control: PRN pain medications, minimize narcotics as able  Dispo: TBD. PT.OT recommending SNF.  Follow - up plan: 2 weeks in office with Dr. Leamon Arnt information:  After hours and holidays please check [email or web address] for group call information for [facility]   Noemi Chapel, PA-C 07/19/22

## 2022-07-19 NOTE — Progress Notes (Signed)
     Daily Progress Note Intern Pager: [phone]  Patient name: Kemesha Mosey Medical record number: [id number] Date of birth: [birth year] Age: 88 y.o. Gender: female  Primary Care Provider: [name], MD Consultants: Orthopedic Surgery Code Status: Full  Pt Overview and Major Events to Date:  07/17/2022 - Admitted  07/18/2022 - Intramedullary nail placement, hip  Assessment and Plan:  Logann Whitebread is a 88 y.o. female presenting after a fall resulting in right hip fracture s/p IMN surgery. Past medical history significant for history of Osteoporosis with pelvic fracture in September, paroxysmal A-fib on Eliquis, CABG, hypertension, hypothyroidism, CKD stage III, tachybradycardia syndrome, HFpEF, pulmonary arterial hypertension.    * Femur fracture ([place]) Mildly distracted fracture of right intertrochanteric femur from mechanical fall. Patient had surgery yesterday and is PO day 1. Hgb decreased from 8.7 to 6.9, will transfuse. Pain well controlled. -Transfuse 1u PRBC -Post transfusion H/H, AM CBC -Hold eliquis for low hgb, SCD's for DVT ppx - [facility] following, appreciate recs - Pain control: Tylenol 1000 mg scheduled every 6 hours, oxycodone 2.5-5 mg q4h prn  -As needed MiraLAX and senna  -Avoid NSAIDs as patient has CKD 3 - Continue home vitamin D 1000 mg daily - PT and OT   Chronic Conditions A-fib: Continue amiodarone 200 mg daily Hypertension: Continue hydralazine 37.5 mg 3 times daily Hyperlipidemia: Continue pravastatin 40 mg daily Hypothyroidism: Continue Synthroid 75 mcg daily GERD: Continue Protonix 40 mg daily Neuropathy: Continue gabapentin 100 mg 3 times daily Anxiety: Continue home Xanax 0.5 mg daily at bedtime Parkinsonism/restless leg syndrome: Continue ropinirole 0.25 mg 3 times daily   FEN/GI: Heart healthy diet PPx: SCD's Dispo:Pending PT recommendations  in 2-3 days.    Subjective:  Pain well controlled, feeling well  today  Objective: Temp:  [98.3 F (36.8 C)-99.4 F (37.4 C)] 98.3 F (36.8 C) (12/23 2156) Pulse Rate:  [57-69] 57 (12/23 2156) Resp:  [14-26] 14 (12/23 2156) BP: (106-159)/(42-92) 106/42 (12/23 2156) SpO2:  [77 %-100 %] 97 % (12/23 2156) Weight:  [52.7 kg] 52.7 kg (12/23 1121) Physical Exam: General: NAD, resting comfortably Cardiovascular: RRR, NRMG, S1/S2 Respiratory: CTABL Abdomen: Soft, NTTP, non-distended Extremities: No pitting edema, bandage on right femur, no ecchymosis, or swelling  Laboratory: Most recent CBC Lab Results  Component Value Date   WBC 4.8 07/19/2022   HGB 6.9 (LL) 07/19/2022   HCT 20.9 (L) 07/19/2022   MCV 100.0 07/19/2022   PLT 121 (L) 07/19/2022   Most recent BMP    Latest Ref Rng & Units 07/18/2022    2:25 AM  BMP  Glucose 70 - 99 mg/dL 115   BUN 8 - 23 mg/dL 18   Creatinine 0.44 - 1.00 mg/dL 1.34   Sodium 135 - 145 mmol/L 141   Potassium 3.5 - 5.1 mmol/L 4.3   Chloride 98 - 111 mmol/L 112   CO2 22 - 32 mmol/L 25   Calcium 8.9 - 10.3 mg/dL 8.9     Holley Bouche, MD 07/19/2022, 7:11 AM  PGY-2, [place] Intern pager: [phone], text pages welcome Secure chat group [place]

## 2022-07-20 DIAGNOSIS — S72144A Nondisplaced intertrochanteric fracture of right femur, initial encounter for closed fracture: Secondary | ICD-10-CM

## 2022-07-20 LAB — CBC
HCT: 24.4 % — ABNORMAL LOW (ref 36.0–46.0)
Hemoglobin: 8.3 g/dL — ABNORMAL LOW (ref 12.0–15.0)
MCH: 32.8 pg (ref 26.0–34.0)
MCHC: 34 g/dL (ref 30.0–36.0)
MCV: 96.4 fL (ref 80.0–100.0)
Platelets: 115 10*3/uL — ABNORMAL LOW (ref 150–400)
RBC: 2.53 MIL/uL — ABNORMAL LOW (ref 3.87–5.11)
RDW: 16.5 % — ABNORMAL HIGH (ref 11.5–15.5)
WBC: 4.7 10*3/uL (ref 4.0–10.5)
nRBC: 0 % (ref 0.0–0.2)

## 2022-07-20 LAB — TYPE AND SCREEN
ABO/RH(D): O NEG
Antibody Screen: NEGATIVE
Unit division: 0

## 2022-07-20 LAB — BPAM RBC
Blood Product Expiration Date: 202312292359
ISSUE DATE / TIME: 202312241151
Unit Type and Rh: 9500

## 2022-07-20 MED ORDER — SENNA 8.6 MG PO TABS
1.0000 | ORAL_TABLET | Freq: Every day | ORAL | Status: DC
  Administered 2022-07-20 – 2022-07-21 (×2): 8.6 mg via ORAL
  Filled 2022-07-20 (×2): qty 1

## 2022-07-20 MED ORDER — APIXABAN 2.5 MG PO TABS
2.5000 mg | ORAL_TABLET | Freq: Two times a day (BID) | ORAL | Status: DC
  Administered 2022-07-20 – 2022-07-22 (×5): 2.5 mg via ORAL
  Filled 2022-07-20 (×5): qty 1

## 2022-07-20 MED ORDER — POLYETHYLENE GLYCOL 3350 17 G PO PACK
17.0000 g | PACK | Freq: Every day | ORAL | Status: DC
  Administered 2022-07-20 – 2022-07-21 (×2): 17 g via ORAL
  Filled 2022-07-20 (×2): qty 1

## 2022-07-20 NOTE — Plan of Care (Signed)

## 2022-07-20 NOTE — Progress Notes (Signed)
   ORTHOPAEDIC PROGRESS NOTE  s/p Procedure(s): INTRAMEDULLARY (IM) NAIL, HIP on 07/18/2022 with Dr. Percell Miller  SUBJECTIVE: Patient's pain is controlled when at rest. She notes she has pain when she tries to move it. She feels better overall after receiving blood yesterday.   No chest pain. No SOB. No nausea/vomiting. No other complaints.  OBJECTIVE: PE: General: resting comfortably in hospital bed, NAD RLE: dressing CDI,  intact EHL/TA/GSC, endorses distal sensation,  leg lengths equal, warm well perfused foot   Vitals:   07/19/22 2002 07/20/22 0804  BP: (!) 113/49 123/67  Pulse: (!) 55 62  Resp: 16 16  Temp: 98.1 F (36.7 C) 97.8 F (36.6 C)  SpO2: 96% 93%     ASSESSMENT: Jacqueline [name] is a 88 y.o. female POD#2  PLAN: Weightbearing: WBAT RLE Insicional and dressing care: Reinforce dressings as needed Orthopedic device(s): None Showering: Hold for now VTE prophylaxis: Lovenox once Hgb improves.  ABLA: Patient received pRBCs yesterday. Hgb 8.3 today Pain control: PRN pain medications, minimize narcotics as able  Dispo: TBD. PT.OT recommending SNF. Follow - up plan: 2 weeks in office with Dr. Leamon Arnt information:  After hours and holidays please check [email or web address] for group call information for [facility]   Noemi Chapel, PA-C 07/20/22

## 2022-07-21 ENCOUNTER — Encounter (HOSPITAL_COMMUNITY): Payer: Self-pay | Admitting: Orthopedic Surgery

## 2022-07-21 LAB — CBC
HCT: 24.9 % — ABNORMAL LOW (ref 36.0–46.0)
Hemoglobin: 8.3 g/dL — ABNORMAL LOW (ref 12.0–15.0)
MCH: 32.2 pg (ref 26.0–34.0)
MCHC: 33.3 g/dL (ref 30.0–36.0)
MCV: 96.5 fL (ref 80.0–100.0)
Platelets: 138 10*3/uL — ABNORMAL LOW (ref 150–400)
RBC: 2.58 MIL/uL — ABNORMAL LOW (ref 3.87–5.11)
RDW: 15.9 % — ABNORMAL HIGH (ref 11.5–15.5)
WBC: 4.9 10*3/uL (ref 4.0–10.5)
nRBC: 0 % (ref 0.0–0.2)

## 2022-07-21 MED ORDER — ORAL CARE MOUTH RINSE: 15.0000 mL | OROMUCOSAL | Status: DC | PRN

## 2022-07-21 MED ORDER — POLYETHYLENE GLYCOL 3350 17 G PO PACK
17.0000 g | PACK | Freq: Two times a day (BID) | ORAL | Status: DC
  Administered 2022-07-21 – 2022-07-22 (×2): 17 g via ORAL
  Filled 2022-07-21 (×2): qty 1

## 2022-07-21 MED ORDER — SENNA 8.6 MG PO TABS
1.0000 | ORAL_TABLET | Freq: Two times a day (BID) | ORAL | Status: DC
  Administered 2022-07-21 – 2022-07-22 (×2): 8.6 mg via ORAL
  Filled 2022-07-21 (×2): qty 1

## 2022-07-21 MED ORDER — ACETAMINOPHEN 325 MG PO TABS
650.0000 mg | ORAL_TABLET | Freq: Four times a day (QID) | ORAL | Status: DC
  Administered 2022-07-21 – 2022-07-22 (×4): 650 mg via ORAL
  Filled 2022-07-21 (×4): qty 2

## 2022-07-21 NOTE — Progress Notes (Signed)
Mobility Specialist Progress Note   07/21/22 0915  Mobility  Activity Transferred from bed to chair;Transferred to/from [facility]  Level of Assistance Maximum assist, patient does 25-49%  Assistive Device Other (Comment);Front wheel walker (Arms Held)  Range of Motion/Exercises Passive;Right leg  RLE Weight Bearing WBAT  Activity Response Tolerated well;Tolerated fair   Patient received in supine and eager to participate. Requested assistance to [facility] to void then agreed to sit in recliner chair. Progressing well with improved UE/core strength and activity tolerance but very limited this session by 10/10 pain in RLE. Focused on methods to manage pain with breathing and proper foot placement. Required TA for bed mobility, max A + VC to transition supine to sit, and max A to stand + cues for hand placement. Upon standing patient with heavy UE reliance on RW and needed mod A to stand upright. While transferring to [facility], attempted to use RW but transfer unsafe as patient impulsively removed hands from RW. Opted to forward facing stand-pivot to recliner after voiding in [facility]. Was left with all needs met, call bell in reach.   [place] Amantha Sklar, BS EXP Mobility Specialist Please contact via SecureChat or Rehab office at [phone]

## 2022-07-21 NOTE — Progress Notes (Signed)
     Daily Progress Note Intern Pager: [phone]  Patient name: Jacqueline [name] Medical record number: [id number] Date of birth: 1931-02-25 Age: 88 y.o. Gender: female  Primary Care Provider: [name], MD Consultants: Orthopedic surgery Code Status: full  Pt Overview and Major Events to Date:  07/17/2022 - Admitted  07/18/2022 - Intramedullary nail placement, hip  Assessment and Plan:  Jacqueline [name] is a 88 y.o. female p/w mechanical fall resulting in R hip fracture, now POD #3 s/p IMN surgery with ortho. PMHx significant for Osteoporosis with pelvic fracture in September, paroxysmal A-fib on Eliquis, CABG, hypertension, hypothyroidism, CKD stage III, tachybradycardia syndrome, HFpEF, pulmonary arterial hypertension   * Femur fracture ([facility]) Mildly distracted fracture of right intertrochanteric femur from mechanical fall. Patient s/p intramedullary nail surgery, PO day 3. Pain well controlled. Required 1u pRBC transfusion on 12/24 but hgb has been stable since then. Hgb 8.3 today, continuing Eliquis. PT/OT recommending SNF vs Home Health pending on improvement with therapy. -Start eliquis - [facility] following, appreciate recs - Pain control: Tylenol 1000 mg scheduled every 6 hours, oxycodone 2.5-5 mg q4h prn  -Miralax and Senna BID  -Avoid NSAIDs as patient has CKD 3 - Continue home vitamin D 1000 mg daily - PT and OT     Chronic Conditions A-fib: Continue amiodarone 200 mg daily HTN: Amlodipine 5 mg daily Hyperlipidemia: Continue pravastatin 40 mg daily Hypothyroidism: Continue Synthroid 75 mcg daily GERD: Continue Protonix 40 mg daily Neuropathy: Continue gabapentin 100 mg 3 times daily Anxiety: Continue home Xanax 0.5 mg daily at bedtime Parkinsonism/restless leg syndrome: Continue ropinirole 0.25 mg 3 times daily   FEN/GI: Reg diet.  PPx: SCDs, Eliquis (afib) Dispo: SNF vs HH pending continued eval  Subjective:  Doing well, no concerns today. Feels her  pain is adequately controlled. Feeling optimistic.  Objective: Temp:  [98 F (36.7 C)-99 F (37.2 C)] 98 F (36.7 C) (12/26 0728) Pulse Rate:  [60-62] 60 (12/26 0728) Resp:  [16] 16 (12/26 0728) BP: (125-128)/(54-56) 125/56 (12/26 0728) SpO2:  [93 %-94 %] 93 % (12/26 0728) Physical Exam: General: NAD, resting comfortably Cardiovascular: RRR no MRG Respiratory: CTAB normal WOB on RA Abdomen: soft NT/ND Extremities: Able to move right foot, no edema or swelling. Dressing over R hip C/D/I  Laboratory: Most recent CBC Lab Results  Component Value Date   WBC 4.9 07/21/2022   HGB 8.3 (L) 07/21/2022   HCT 24.9 (L) 07/21/2022   MCV 96.5 07/21/2022   PLT 138 (L) 07/21/2022   Most recent BMP    Latest Ref Rng & Units 07/18/2022    2:25 AM  BMP  Glucose 70 - 99 mg/dL 115   BUN 8 - 23 mg/dL 18   Creatinine 0.44 - 1.00 mg/dL 1.34   Sodium 135 - 145 mmol/L 141   Potassium 3.5 - 5.1 mmol/L 4.3   Chloride 98 - 111 mmol/L 112   CO2 22 - 32 mmol/L 25   Calcium 8.9 - 10.3 mg/dL 8.9      August Albino, MD 07/21/2022, 11:51 AM  PGY-1, [place] Intern pager: [phone], text pages welcome Secure chat group [place]

## 2022-07-21 NOTE — Progress Notes (Signed)
PT Cancellation Note  Patient Details Name: Jacqueline [name] MRN: [id number] DOB: 1930/10/05   Cancelled Treatment:    Reason Eval/Treat Not Completed: (P) Pain limiting ability to participate (pt defers, she just got back to bed with assist from mobility specialist and had pain meds, wanting to rest before getting up again.) Will continue efforts per PT POC as schedule permits.   Maxwell Martorano M Luisalberto Beegle 07/21/2022, 12:02 PM

## 2022-07-21 NOTE — Care Management Important Message (Signed)
Important Message  Patient Details  Name: Jacqueline [name] MRN: [id number] Date of Birth: [birth year]   Medicare Important Message Given:  Yes     Orbie Pyo 07/21/2022, 3:33 PM

## 2022-07-22 ENCOUNTER — Other Ambulatory Visit (HOSPITAL_COMMUNITY): Payer: Self-pay

## 2022-07-22 MED ORDER — OXYCODONE HCL 5 MG PO TABS
5.0000 mg | ORAL_TABLET | ORAL | 0 refills | Status: DC | PRN
  Filled 2022-07-22: qty 30, 5d supply, fill #0

## 2022-07-22 MED ORDER — OXYCODONE HCL 5 MG PO TABS: 5.0000 mg | ORAL_TABLET | ORAL | 0 refills | Status: AC | PRN

## 2022-07-22 MED ORDER — ACETAMINOPHEN 325 MG PO TABS: 650.0000 mg | ORAL_TABLET | Freq: Four times a day (QID) | ORAL | 0 refills | Status: DC

## 2022-07-22 NOTE — Progress Notes (Signed)
Pt left w/PTAR. Pt had all belongings and was given something within the last hour for pain.  Pt stated that she hadn't had a bowel movement for 4 days.  Pt taking meds here, but educated pt on updating facility when she arrives.

## 2022-07-22 NOTE — Progress Notes (Signed)
Mobility Specialist Progress Note   07/22/22 1541  Mobility  Activity Stood at bedside  Level of Assistance Moderate assist, patient does 50-74%  Assistive Device Front wheel walker  Distance Ambulated (ft) 2 ft  RLE Weight Bearing WBAT  Activity Response Tolerated well  $Mobility charge 1 Mobility   Found pt in bed after receiving scheduled pain meds and stating they have constant but little pain in R hip. Agreeable to standing at bedside. ModA to get EOB d/t limited strength and decreased pain tolerance in R hip. MinG to stand, able to remain standing for 1.37mns then requesting to get back in bed. Left supine w/ call bell in reach and pt awaiting d/c.   JHolland FallingMobility Specialist Please contact via SecureChat or  Rehab office at [phone]

## 2022-07-22 NOTE — Plan of Care (Signed)

## 2022-07-22 NOTE — Discharge Summary (Addendum)
[facility] Discharge Summary  Patient name: Jacqueline [name] Medical record number: [id number] Date of birth: June 06, 1931 Age: 88 y.o. Gender: female Date of Admission: 07/17/2022  Date of Discharge: 07/22/2022 Admitting Physician: Lowry Ram, MD  Primary Care Provider: [name], MD Consultants: Orthopedic surgery  Indication for Hospitalization: R femur fracture  Discharge Diagnoses/Problem List:  Principal Problem for Admission: R femur fracture Other Problems addressed during stay:  Principal Problem:   Femur fracture [facility])  Brief Hospital Course:  Jacqueline [name] is a 88 y.o. female who was admitted with right femur fracture s/p mechanical fall. PMH significant for paroxysmal a-fib on Eliquis, CABG, HTN, CKD 3b, PAH, and HFpEF.  Right Femur Fracture Mildly displaced right intertrochanteric femur fracture seen on x-ray. Underwent ORIF on 12/23. Received 1u pRBC for postoperative anemia to 6.9. Hgb remained stably >8. Postoperatively, pain control was challenging. She was evaluated by PT/OT and determined need to go to SNF.  She was discharged to SNF with recommendations of Tylenol 650 mg every 6 hours and oxycodone 5 mg every 4 hours as needed for up to 5 days for severe pain.  Follow up: F/u CBC - pt required 1u pRBC transfusion during her stay Hydralazine and midodrine were discontinued during hospital setting as patient's blood pressure was stable on just amlodipine.  Restart as necessary. Trazodone was discontinued as it was not required throughout hospitalization and it may increase her risk of falls.  Disposition: SNF - [facility]  Discharge Condition: Stable   Discharge Exam:  Vitals:   07/22/22 0723 07/22/22 0925  BP: (!) 134/57 (!) 147/59  Pulse: 62   Resp:    Temp: 98 F (36.7 C)   SpO2: 93%    General: Sitting up in bed. Alert, NAD. Cardiovascular: RRR without murmur Respiratory: CTAB. Normal WOB on RA Abdomen:  Soft, non-tender Extremities: Hip flexion limited due to pain. 5/5 dorsiflexion and plantarflexion on R leg. Sensation intact. 2+ dorsalis pedis pulse  Significant Procedures: Intramedullary nail of R femur  Significant Labs and Imaging:  Recent Labs  Lab 07/21/22 0417  WBC 4.9  HGB 8.3*  HCT 24.9*  PLT 138*   No results for input(s): "NA", "K", "CL", "CO2", "GLUCOSE", "BUN", "CREATININE", "CALCIUM", "MG", "PHOS", "ALKPHOS", "AST", "ALT", "ALBUMIN", "PROTEIN" in the last 48 hours.  Pertinent Imaging: DG FEMUR, MIN 2 VIEWS RIGHT Result Date: 07/18/2022 IMPRESSION: Intraoperative images during intertrochanteric fracture fixation of the right proximal femur. No evidence of immediate hardware complication. Improved fracture alignment.  DG C-Arm 1-60 Min-No Report Result Date: 07/18/2022 Fluoroscopy was utilized by the requesting physician.  No radiographic interpretation.   DG Pelvis 1-2 Views Result Date: 07/17/2022 IMPRESSION: 1. Mildly distracted fracture of the intertrochanteric right femur. 2. Healing fractures of the superior and inferior right pubic rami.   DG Femur Min 2 Views Right Result Date: 07/17/2022 IMPRESSION: 1. Mildly distracted fracture of the intertrochanteric right femur. 2. Healing fractures of the superior and inferior right pubic rami.   DG Knee 2 Views Right Result Date: 07/17/2022 IMPRESSION: 1. Mildly distracted fracture of the intertrochanteric right femur. 2. Healing fractures of the superior and inferior right pubic rami.     Results/Tests Pending at Time of Discharge: None  Discharge Medications:  Allergies as of 07/22/2022       Reactions   Atorvastatin Other (See Comments)   Myalgias in legs   Beta Adrenergic Blockers Other (See Comments)   Severe bradycardia   Crestor [rosuvastatin] Other (See  Comments)   Myalgias in legs   Morphine And Related Other (See Comments)   "Crazy thoughts, severe headache, sick"   Prednisone    On two  occasions has caused A fib        Medication List     STOP taking these medications    hydrALAZINE 25 MG tablet Commonly known as: APRESOLINE   midodrine 2.5 MG tablet Commonly known as: PROAMATINE   traMADol 50 MG tablet Commonly known as: ULTRAM   traZODone 100 MG tablet Commonly known as: DESYREL       TAKE these medications    acetaminophen 325 MG tablet Commonly known as: TYLENOL Take 2 tablets (650 mg total) by mouth every 6 (six) hours. What changed:  medication strength how much to take when to take this reasons to take this   ALPRAZolam 0.5 MG tablet Commonly known as: XANAX TAKE 1 TABLET BY MOUTH AT BEDTIME.   amiodarone 200 MG tablet Commonly known as: PACERONE TAKE 1 TABLET BY MOUTH EVERY DAY   amLODipine 5 MG tablet Commonly known as: NORVASC Take 5 mg by mouth daily.   apixaban 2.5 MG Tabs tablet Commonly known as: ELIQUIS Take 1 tablet (2.5 mg total) by mouth 2 (two) times daily.   cholecalciferol 25 MCG (1000 UNIT) tablet Commonly known as: VITAMIN D3 Take 1,000 Units by mouth daily.   cycloSPORINE 0.05 % ophthalmic emulsion Commonly known as: RESTASIS Place 1 drop into both eyes 2 (two) times daily.   Fish Oil 1000 MG Caps Take 1,000 mg by mouth every evening.   gabapentin 100 MG capsule Commonly known as: NEURONTIN TAKE 1 CAPSULE (100 MG TOTAL) BY MOUTH THREE TIMES DAILY. What changed: See the new instructions.   levothyroxine 75 MCG tablet Commonly known as: SYNTHROID TAKE 1 TABLET BY MOUTH EVERY DAY What changed: when to take this   Lubricant Eye Drops 0.4-0.3 % Soln Generic drug: Polyethyl Glycol-Propyl Glycol Place 1 drop into both eyes 4 (four) times daily.   multivitamin with minerals Tabs tablet Take 1 tablet by mouth daily.   nitroGLYCERIN 0.4 MG SL tablet Commonly known as: NITROSTAT Place 1 tablet (0.4 mg total) under the tongue every 5 (five) minutes as needed for chest pain.   omeprazole 20 MG  capsule Commonly known as: PRILOSEC TAKE 1 CAPSULE BY MOUTH EVERY DAY What changed: how much to take   oxyCODONE 5 MG immediate release tablet Commonly known as: Oxy IR/ROXICODONE Take 1 tablet (5 mg total) by mouth every 4 (four) hours as needed for up to 5 days for severe pain.   polyethylene glycol 17 g packet Commonly known as: MIRALAX / GLYCOLAX Take 17 g by mouth 2 (two) times daily. What changed:  when to take this reasons to take this   pravastatin 40 MG tablet Commonly known as: PRAVACHOL TAKE 1 TABLET BY MOUTH EVERY DAY IN THE EVENING What changed:  how much to take how to take this   rOPINIRole 0.25 MG tablet Commonly known as: REQUIP TAKE 1 TABLET BY MOUTH THREE TIMES A DAY   senna-docusate 8.6-50 MG tablet Commonly known as: Senokot-S Take 2 tablets by mouth 2 (two) times daily. What changed:  when to take this reasons to take this       Discharge Instructions: Please refer to Patient Instructions section of EMR for full details.  Patient was counseled important signs and symptoms that should prompt return to medical care, changes in medications, dietary instructions, activity restrictions, and follow up  appointments.   Follow-Up Appointments:  Follow-up Information     Renette Butters, MD. Schedule an appointment as soon as possible for a visit in 2 week(s).   Specialty: Orthopedic Surgery Contact information: [address] [place] [address] [phone]                Colletta Maryland, MD 07/22/2022, 2:41 PM PGY-1, [place] Upper-Level Resident Addendum   I have independently interviewed and examined the patient. I have discussed the above with Dr. Jerilee Hoh and agree with the documented plan. My edits for correction/addition/clarification are included above. Please see any attending notes.   Wells Guiles, DO PGY-2, [facility] 07/22/2022 3:01 PM  FPTS Service  pager: [phone] (text pages welcome through [facility])

## 2022-07-22 NOTE — Progress Notes (Addendum)
Daily Progress Note Intern Pager: [phone]  Patient name: Jacqueline [name] Medical record number: [id number] Date of birth: 1930/09/28 Age: 88 y.o. Gender: female  Primary Care Provider: [name], MD Consultants: Orthopedic surgery Code Status: Full code   Pt Overview and Major Events to Date:  12/22: Admitted to [facility] 12/23: Intramedullary nail placement, hip   Assessment and Plan:  Jacqueline [name] is a 88 y.o. female p/w mechanical fall resulting in R hip fracture, now s/p IMN repair. PMHx significant for Osteoporosis with pelvic fracture in September, paroxysmal A-fib on Eliquis, CABG, hypertension, hypothyroidism, CKD stage III, tachybradycardia syndrome, HFpEF, pulmonary arterial hypertension   * Femur fracture (HCC) S/p repair of R femur fracture, day #4. Significant pain with movement limiting rehabilitation effort. Unable to work with PT yesterday due to pain. Discussed timing oxycodone prior to movement since pain is mostly controlled at rest. Denies recent BM, will continue current therapy and escalate tomorrow if no progress. Would like to go to SNF for continued recovery -[facility] signed off, will f/u outpatient in 2 weeks -Pain control: Tylenol 1000 mg scheduled every 6 hours, oxycodone 2.5-5 mg q4h prn, Roxiban '1000mg'$  q6h prn -Bowel regimen: Miralax BID, Senna BID -PT/OT following -Continue Eliquis   Chronic Conditions A-fib: Continue amiodarone 200 mg daily HTN: Amlodipine 5 mg daily Hyperlipidemia: Continue pravastatin 40 mg daily Hypothyroidism: Continue Synthroid 75 mcg daily GERD: Continue Protonix 40 mg daily Neuropathy: Continue gabapentin 100 mg 3 times daily Anxiety: Continue home Xanax 0.5 mg daily at bedtime Parkinsonism/restless leg syndrome: Continue ropinirole 0.25 mg 3 times daily   FEN/GI: Reg diet.  PPx: SCDs, Eliquis (afib) Dispo: SNF vs HH pending continued eval  Subjective:  Patient assessed at bedside and related she is  having significant pain with movement. States she was unable to participate in ambulation yesterday due to pain. Feels fine at rest with just some lingering pain but becomes unbearable when trying to do anything. Continues to have poor appetite, this is a chronic issue but she states regular diet helps. Urinating appropriately. Denies BM but states it can take a few days of stool softener to go but wants to avoid enema. Requesting to go to SNF at [facility]  Patient is medically stable for discharge to SNF.  Objective: Temp:  [97.8 F (36.6 C)-98.2 F (36.8 C)] 98 F (36.7 C) (12/27 0723) Pulse Rate:  [61-65] 62 (12/27 0723) Resp:  [16] 16 (12/26 1931) BP: (127-147)/(50-59) 147/59 (12/27 0925) SpO2:  [93 %-95 %] 93 % (12/27 0723) Physical Exam: General: Sitting up in bed. Alert, NAD. Cardiovascular: RRR without murmur Respiratory: CTAB. Normal WOB on RA Abdomen: Soft, non-tender Extremities: Hip flexion limited due to pain. 5/5 dorsiflexion and plantarflexion on R leg. Sensation intact. 2+ dorsalis pedis pulse  Laboratory: Most recent CBC Lab Results  Component Value Date   WBC 4.9 07/21/2022   HGB 8.3 (L) 07/21/2022   HCT 24.9 (L) 07/21/2022   MCV 96.5 07/21/2022   PLT 138 (L) 07/21/2022   Most recent BMP    Latest Ref Rng & Units 07/18/2022    2:25 AM  BMP  Glucose 70 - 99 mg/dL 115   BUN 8 - 23 mg/dL 18   Creatinine 0.44 - 1.00 mg/dL 1.34   Sodium 135 - 145 mmol/L 141   Potassium 3.5 - 5.1 mmol/L 4.3   Chloride 98 - 111 mmol/L 112   CO2 22 - 32 mmol/L 25   Calcium 8.9 - 10.3 mg/dL  8.9     Colletta Maryland, MD 07/22/2022, 11:22 AM  PGY-1, [place] Intern pager: [phone], text pages welcome Secure chat group [place]

## 2022-07-22 NOTE — TOC Transition Note (Signed)
Transition of Care [facility]) - CM/SW Discharge Note   Patient Details  Name: Jacqueline [name] MRN: [id number] Date of Birth: 25-Sep-1930  Transition of Care [facility]) CM/SW Contact:  Joanne Chars, LCSW Phone Number: 07/22/2022, 2:56 PM   Clinical Narrative:   Pt discharging to [facility], room 3213.  RN call report to [phone].    Final next level of care: Skilled Nursing Facility Barriers to Discharge: Barriers Resolved   Patient Goals and CMS Choice   Choice offered to / list presented to : Patient  Discharge Placement                Patient chooses bed at: [facility] Patient to be transferred to facility by: [place] Name of family member notified: son Duane Patient and family notified of of transfer: 07/22/22  Discharge Plan and Services Additional resources added to the After Visit Summary for   In-house Referral: Clinical Social Work   Post Acute Care Choice: [place]                               Social Determinants of Health (SDOH) Interventions SDOH Screenings   Food Insecurity: No Food Insecurity (07/18/2022)  Housing: Low Risk  (07/18/2022)  Transportation Needs: No Transportation Needs (07/18/2022)  Utilities: Not At Risk (07/18/2022)  Depression (PHQ2-9): Low Risk  (05/25/2022)  Recent Concern: Depression (PHQ2-9) - High Risk (05/07/2022)  Financial Resource Strain: Low Risk  (07/18/2018)  Physical Activity: Insufficiently Active (07/18/2018)  Social Connections: Somewhat Isolated (07/18/2018)  Stress: No Stress Concern Present (07/18/2018)  Tobacco Use: Low Risk  (07/21/2022)     Readmission Risk Interventions     No data to display

## 2022-07-22 NOTE — TOC Progression Note (Addendum)
Transition of Care [facility]) - Progression Note    Patient Details  Name: Jacqueline [name] MRN: [id number] Date of Birth: 04-18-1931  Transition of Care [facility]) CM/SW Contact  Joanne Chars, LCSW Phone Number: 07/22/2022, 8:52 AM  Clinical Narrative:   CSW spoke with son Duane who is requesting pt go to [facility]--pt does have offer from [facility], CSW confirmed bed with [facility].  Auth request message left with [facility].   1415: Auth approved by HTA: 7 days SNF: [id number], PTAR transport: [id number].  [facility] can still take today if DC summary can be sent by 330--MD informed.   Expected Discharge Plan: [place] Barriers to Discharge: Continued Medical Work up, SNF Pending bed offer  Expected Discharge Plan and Services In-house Referral: Clinical Social Work   Post Acute Care Choice: [place] Living arrangements for the past 2 months: [place] Determinants of Health (SDOH) Interventions [place]: No Food Insecurity (07/18/2022)  Housing: Low Risk  (07/18/2022)  Transportation Needs: No Transportation Needs (07/18/2022)  Utilities: Not At Risk (07/18/2022)  Depression (PHQ2-9): Low Risk  (05/25/2022)  Recent Concern: Depression (PHQ2-9) - High Risk (05/07/2022)  Financial Resource Strain: Low Risk  (07/18/2018)  Physical Activity: Insufficiently Active (07/18/2018)  Social Connections: Somewhat Isolated (07/18/2018)  Stress: No Stress Concern Present (07/18/2018)  Tobacco Use: Low Risk  (07/21/2022)    Readmission Risk Interventions     No data to display

## 2022-07-22 NOTE — Progress Notes (Signed)
LPN give report to nurse Juliann Pulse at [facility]. Pt still awaiting PTAR to arrive.

## 2022-07-22 NOTE — Progress Notes (Addendum)
Physical Therapy Treatment Patient Details Name: Jacqueline [name] MRN: [id number] DOB: 24-Oct-1930 Today's Date: 07/22/2022   History of Present Illness Pt is an 88 y.o. female presenting 07/17/22 after a fall and was found to have a R hip fracture. Pt is now s/p intramedullary nailing of the R femur on 12/23. PMH significant for pAfib on eliquis, CABG, HTN, CKD 3b, PAH, HfpEF.    PT Comments    Pt received in supine, agreeable to therapy session with encouragement, with emphasis on transfer and pre-gait training at bedside. Pt anxious in anticipation of pain despite premedication and RN also gave Tylenol during session to supplement. Pt needing up to maxA for bed mobility and modA for transfers and a few pivotal/backward steps from [facility] to recliner. Encouraged her to use ice PRN pre/post mobility to help with pain/edema mgmt along with PO meds. Pt continues to benefit from PT services to progress toward functional mobility goals, frequency updated below per discussion with supervising PT Gretel Acre.    Recommendations for follow up therapy are one component of a multi-disciplinary discharge planning process, led by the attending physician.  Recommendations may be updated based on patient status, additional functional criteria and insurance authorization.  Follow Up Recommendations  Skilled nursing-short term rehab (<3 hours/day) Can patient physically be transported by private vehicle: No   Assistance Recommended at Discharge Intermittent Supervision/Assistance  Patient can return home with the following Two people to help with walking and/or transfers;A lot of help with bathing/dressing/bathroom;Assistance with cooking/housework;Assist for transportation;Help with stairs or ramp for entrance   Equipment Recommendations  BSC/3in1;Wheelchair (measurements PT);Wheelchair cushion (measurements PT)    Recommendations for Other Services       Precautions / Restrictions  Precautions Precautions: Fall;Other (comment) Precaution Comments: watch BP Restrictions Weight Bearing Restrictions: Yes RLE Weight Bearing: Weight bearing as tolerated     Mobility  Bed Mobility Overal bed mobility: Needs Assistance Bed Mobility: Supine to Sit, Sit to Supine     Supine to sit: Max assist, HOB elevated     General bed mobility comments: PTA cuing pt to pull on L bed rail to sit up, needing maxA using bed pad to transition supine > sit L EOB with HOB slightly elevated and assist to advance RLE and elevated trunk via HHA. Pt able to use elbow prop to assist with trunk raise. Posterior lean up sitting up, pt using BUE for support.    Transfers Overall transfer level: Needs assistance Equipment used: Rolling walker (2 wheels), Ambulation equipment used Transfers: Sit to/from Stand, Bed to chair/wheelchair/BSC Sit to Stand: +2 safety/equipment, Mod assist, From elevated surface   Step pivot transfers: Min assist, From elevated surface       General transfer comment: Good initiation noted by pt to come to stand from EOB, cuing pt to place R foot anteriorly for pain management and push up from bed. EOB>Stedy and Stedy>BSC, then pt stood to RW with modA +1 to stabilize and minA for small shuffled steps from BSC>recliner with RW support. Transfer via Lift Equipment: Stedy  Ambulation/Gait Ambulation/Gait assistance: Min assist, Mod assist Gait Distance (Feet): 3 Feet Assistive device: Rolling walker (2 wheels) Gait Pattern/deviations: Step-to pattern, Decreased weight shift to right, Decreased step length - left, Decreased step length - right, Decreased dorsiflexion - right, Decreased dorsiflexion - left, Antalgic       General Gait Details: Pt sliding L foot to step and needing min to modA to facilitate weight shifting and R lower extremity movement to  step from BSC>recliner to her L and backward stepping   Stairs             Wheelchair Mobility     Modified Rankin (Stroke Patients Only)       Balance Overall balance assessment: Needs assistance Sitting-balance support: Single extremity supported, Bilateral upper extremity supported, Feet supported Sitting balance-Leahy Scale: Poor Sitting balance - Comments: Reliant on B UE support to sit statically EOB, modA progressing to min guard Postural control: Posterior lean Standing balance support: Bilateral upper extremity supported, During functional activity, Reliant on assistive device for balance Standing balance-Leahy Scale: Poor Standing balance comment: Reliant on RW/Stedy and external physical assistance                            Cognition Arousal/Alertness: Awake/alert Behavior During Therapy: WFL for tasks assessed/performed Overall Cognitive Status: Within Functional Limits for tasks assessed                                 General Comments: Internally distracted by pain, needing cues to sequence mobility, but likely baseline if not limited by pain.        Exercises Other Exercises Other Exercises: supine BLE AROM: ankle pumps, hip ab/ADduction x5 reps ea (AA on RLE), SLR (AA/PROM on RLE) x1 rep ea    General Comments General comments (skin integrity, edema, etc.): no acute s/sx distress other than moderate to severe reported pain, RN aware and gave Tylenol during session      Pertinent Vitals/Pain Pain Assessment Pain Assessment: Faces Faces Pain Scale: Hurts whole lot Pain Location: R hip Pain Descriptors / Indicators: Discomfort, Grimacing, Operative site guarding, Moaning Pain Intervention(s): Monitored during session, Limited activity within patient's tolerance, Premedicated before session, Repositioned, RN gave pain meds during session, Ice applied     PT Goals (current goals can now be found in the care plan section) Acute Rehab PT Goals Patient Stated Goal: Less pain, to get stronger so I can go home PT Goal Formulation:  With patient Time For Goal Achievement: 08/02/22 Progress towards PT goals: Progressing toward goals    Frequency    Min 3X/week      PT Plan Frequency needs to be updated       AM-PAC PT "6 Clicks" Mobility   Outcome Measure  Help needed turning from your back to your side while in a flat bed without using bedrails?: A Lot Help needed moving from lying on your back to sitting on the side of a flat bed without using bedrails?: A Lot Help needed moving to and from a bed to a chair (including a wheelchair)?: A Lot Help needed standing up from a chair using your arms (e.g., wheelchair or bedside chair)?: A Lot Help needed to walk in hospital room?: Total Help needed climbing 3-5 steps with a railing? : Total 6 Click Score: 10    End of Session Equipment Utilized During Treatment: Gait belt Activity Tolerance: Patient limited by pain;Patient tolerated treatment well Patient left: in chair;with call bell/phone within reach;Other (comment) (pt aware of call bell use; RN notified will need chair alarm pad placed due to fall risk when she gets up next.) Nurse Communication: Mobility status;Other (comment);Precautions (pain score; may do better with Stedy if +1 staff only available) PT Visit Diagnosis: Unsteadiness on feet (R26.81);Other abnormalities of gait and mobility (R26.89);Muscle weakness (generalized) (M62.81);History of falling (Z91.81);Repeated  falls (R29.6);Difficulty in walking, not elsewhere classified (R26.2);Pain Pain - Right/Left: Right Pain - part of body: Hip     Time: 1107-1129 PT Time Calculation (min) (ACUTE ONLY): 22 min  Charges:  $Therapeutic Activity: 8-22 mins                     Sharnice Bosler P., PTA Acute Rehabilitation Services Secure Chat Preferred 9a-5:30pm Office: [place] 07/22/2022, 11:47 AM

## 2022-07-22 NOTE — TOC Initial Note (Signed)
Transition of Care [facility]) - Initial/Assessment Note    Patient Details  Name: Jacqueline [name] MRN: [id number] Date of Birth: 07-Nov-1930  Transition of Care [facility]) CM/SW Contact:    Joanne Chars, LCSW Phone Number: 07/22/2022, 8:41 AM  Clinical Narrative:     CSW met with pt regarding DC recommendation for SNF.  Pt is agreeable, has been to [place] before, does not want to return there, would be interested in [place] or [place]. Permission given to send out referral in hub, permission given to speak with sons Darrel and Duane.  Pt lives alone, no current services, son Marguerite Olea lives close by.  Pt is vaccinated for covid with oen booster.                Expected Discharge Plan: Skilled Nursing Facility Barriers to Discharge: Continued Medical Work up, SNF Pending bed offer   Patient Goals and CMS Choice Patient states their goals for this hospitalization and ongoing recovery are:: be able to do my thing   Choice offered to / list presented to : Patient      Expected Discharge Plan and Services In-house Referral: Clinical Social Work   Post Acute Care Choice: [place] Living arrangements for the past 2 months: [place]                                      Prior Living Arrangements/Services Living arrangements for the past 2 months: Single Family Home Lives with:: Self Patient language and need for interpreter reviewed:: Yes Do you feel safe going back to the place where you live?: Yes      Need for Family Participation in Patient Care: Yes (Comment) Care giver support system in place?: Yes (comment) Current home services: Other (comment) (none) Criminal Activity/Legal Involvement Pertinent to Current Situation/Hospitalization: No - Comment as needed  Activities of Daily Living Home Assistive Devices/Equipment: Cane (specify quad or straight), Dentures (specify type), Eyeglasses, Walker (specify type) ADL Screening (condition at time  of admission) Patient's cognitive ability adequate to safely complete daily activities?: Yes Is the patient deaf or have difficulty hearing?: No Does the patient have difficulty seeing, even when wearing glasses/contacts?: No Does the patient have difficulty concentrating, remembering, or making decisions?: No Patient able to express need for assistance with ADLs?: Yes Does the patient have difficulty dressing or bathing?: No Independently performs ADLs?: Yes (appropriate for developmental age) Does the patient have difficulty walking or climbing stairs?: Yes Weakness of Legs: Both Weakness of Arms/Hands: None  Permission Sought/Granted Permission sought to share information with : Family Supports Permission granted to share information with : Yes, Verbal Permission Granted  Share Information with NAME: sons Darrell and Duane  Permission granted to share info w AGENCY: SNF        Emotional Assessment Appearance:: Appears stated age Attitude/Demeanor/Rapport: Engaged Affect (typically observed): Pleasant, Appropriate Orientation: : Oriented to Self, Oriented to Place, Oriented to  Time, Oriented to Situation      Admission diagnosis:  Femur fracture ([place]) [S72.90XA] Closed nondisplaced intertrochanteric fracture of right femur, initial encounter ([place]) [L27.517G] Patient Active Problem List   Diagnosis Date Noted   Femur fracture ([place]) 07/17/2022   Inferior pubic ramus fracture ([place]) 02/09/2022   Right rib fracture 02/09/2022   Bronchitis 10/22/2021   Elevated troponin 10/22/2021   Abnormal ECG 10/22/2021   Polypharmacy 02/21/2021   Hypertensive kidney disease with CKD  stage III ([place]) 10/17/2020   Tremor 12/13/2019   Weakness generalized 06/08/2019   Frequent falls 06/08/2019   Pes anserinus bursitis of right knee 03/23/2019   Vertigo 11/24/2018   On amiodarone therapy 07/08/2018   Adjustment reaction with physical symptoms 09/16/2017   Atherosclerosis of abdominal aorta  ([place]) 07/22/2017   Hypothyroidism 07/22/2017   CAD (coronary artery disease)    Long term (current) use of anticoagulants 07/08/2017   Hereditary and idiopathic peripheral neuropathy 12/12/2015   Chronic diastolic CHF (congestive heart failure) ([place]) 06/05/2015   Pulmonary arterial hypertension ([place]) 05/17/2015   Tachy-brady syndrome ([place]) 05/06/2015   Hx of CABG 05/06/2015   Preventative health care 03/27/2015   Acute on chronic diastolic heart failure ([place])    S/P knee replacement 10/08/2014   Osteoporosis 03/22/2014   Paroxysmal atrial fibrillation ([place]) 02/27/2014   Hypertension    GERD (gastroesophageal reflux disease)    Dyslipidemia 09/23/2006   RHINITIS, ALLERGIC 09/23/2006   REFLUX ESOPHAGITIS 09/23/2006   DIVERTICULOSIS OF COLON 09/23/2006   BACK PAIN, LOW 09/23/2006   INSOMNIA NOS 09/23/2006   PCP:  [name], MD Pharmacy:   [id number] [address]Phone: [phone]: [phone] [address] [address] [address]: [phone]: [phone] [facility] [address]: [phone]: [address]Phone: [phone]: [phone]    Social Determinants of Health (SDOH) Social History: SDOH Screenings   Food Insecurity: No Food Insecurity (07/18/2022)  Housing: Low Risk  (07/18/2022)  Transportation Needs: No Transportation Needs (07/18/2022)  Utilities: Not At Risk (07/18/2022)  Depression (PHQ2-9): Low Risk  (05/25/2022)  Recent Concern: Depression (PHQ2-9) - High Risk (05/07/2022)  Financial Resource Strain: Low Risk  (07/18/2018)  Physical Activity: Insufficiently Active  (07/18/2018)  Social Connections: Somewhat Isolated (07/18/2018)  Stress: No Stress Concern Present (07/18/2018)  Tobacco Use: Low Risk  (07/21/2022)   SDOH Interventions:     Readmission Risk Interventions     No data to display

## 2022-07-22 NOTE — NC FL2 (Signed)
[place] LEVEL OF CARE FORM     IDENTIFICATION  Patient Name: Jacqueline [name] Birthdate: 03-23-1931 Sex: female Admission Date (Current Location): 07/17/2022  [facility] and [place] Number:  Herbalist and Address:  [facility] [place], [place] [address] [place], [place] [address]      Provider Number: [id number]  Attending Physician Name and Address:  Kinnie Feil, MD  Relative Name and Phone Number:  Hiltunen,Darrell Son   [phone]    Current Level of Care: Hospital Recommended Level of Care: [place] Prior Approval Number:    Date Approved/Denied:   PASRR Number: [id number]  Discharge Plan: SNF    Current Diagnoses: Patient Active Problem List   Diagnosis Date Noted   Femur fracture ([place]) 07/17/2022   Inferior pubic ramus fracture ([place]) 02/09/2022   Right rib fracture 02/09/2022   Bronchitis 10/22/2021   Elevated troponin 10/22/2021   Abnormal ECG 10/22/2021   Polypharmacy 02/21/2021   Hypertensive kidney disease with CKD stage III ([place]) 10/17/2020   Tremor 12/13/2019   Weakness generalized 06/08/2019   Frequent falls 06/08/2019   Pes anserinus bursitis of right knee 03/23/2019   Vertigo 11/24/2018   On amiodarone therapy 07/08/2018   Adjustment reaction with physical symptoms 09/16/2017   Atherosclerosis of abdominal aorta ([place]) 07/22/2017   Hypothyroidism 07/22/2017   CAD (coronary artery disease)    Long term (current) use of anticoagulants 07/08/2017   Hereditary and idiopathic peripheral neuropathy 12/12/2015   Chronic diastolic CHF (congestive heart failure) ([place]) 06/05/2015   Pulmonary arterial hypertension ([place]) 05/17/2015   Tachy-brady syndrome ([place]) 05/06/2015   Hx of CABG 05/06/2015   Preventative health care 03/27/2015   Acute on chronic diastolic heart failure ([place])    S/P knee replacement 10/08/2014   Osteoporosis 03/22/2014   Paroxysmal atrial fibrillation ([place]) 02/27/2014    Hypertension    GERD (gastroesophageal reflux disease)    Dyslipidemia 09/23/2006   RHINITIS, ALLERGIC 09/23/2006   REFLUX ESOPHAGITIS 09/23/2006   DIVERTICULOSIS OF COLON 09/23/2006   BACK PAIN, LOW 09/23/2006   INSOMNIA NOS 09/23/2006    Orientation RESPIRATION BLADDER Height & Weight     Self, Time, Situation, Place  Normal Continent Weight: 116 lb 2.9 oz (52.7 kg) Height:  5' 0.98" (154.9 cm)  BEHAVIORAL SYMPTOMS/MOOD NEUROLOGICAL BOWEL NUTRITION STATUS      Continent Diet (see discharge summary)  AMBULATORY STATUS COMMUNICATION OF NEEDS Skin   Limited Assist Verbally Surgical wounds                       Personal Care Assistance Level of Assistance  Bathing, Feeding, Dressing Bathing Assistance: Maximum assistance Feeding assistance: Independent Dressing Assistance: Maximum assistance     Functional Limitations Info  Sight, Hearing, Speech Sight Info: Adequate Hearing Info: Adequate Speech Info: Adequate    SPECIAL CARE FACTORS FREQUENCY  PT (By licensed PT), OT (By licensed OT)     PT Frequency: 5x week OT Frequency: 5x week            Contractures Contractures Info: Not present    Additional Factors Info  Code Status, Allergies Code Status Info: full Allergies Info: Atorvastatin, Beta Adrenergic Blockers, Crestor (Rosuvastatin), Morphine And Related, Prednisone           Current Medications (07/22/2022):  This is the current hospital active medication list Current Facility-Administered Medications  Medication Dose Route Frequency Provider Last Rate Last Admin   0.9 %  sodium  chloride infusion (Manually program via Guardrails IV Fluids)   Intravenous Once Ganta, Anupa, DO       acetaminophen (TYLENOL) tablet 650 mg  650 mg Oral Q6H Ezequiel Essex, MD   650 mg at 07/22/22 0507   ALPRAZolam Duanne Moron) tablet 0.5 mg  0.5 mg Oral QHS Aggie Moats M, PA-C   0.5 mg at 07/21/22 2147   amiodarone (PACERONE) tablet 200 mg  200 mg Oral Daily Aggie Moats  M, PA-C   200 mg at 07/21/22 0849   amLODipine (NORVASC) tablet 5 mg  5 mg Oral Daily Aggie Moats M, PA-C   5 mg at 07/21/22 9390   apixaban (ELIQUIS) tablet 2.5 mg  2.5 mg Oral BID Holley Bouche, MD   2.5 mg at 07/21/22 2147   cholecalciferol (VITAMIN D3) 25 MCG (1000 UNIT) tablet 1,000 Units  1,000 Units Oral Daily Britt Bottom, PA-C   1,000 Units at 07/21/22 3009   cycloSPORINE (RESTASIS) 0.05 % ophthalmic emulsion 1 drop  1 drop Both Eyes BID Aggie Moats M, PA-C   1 drop at 07/21/22 2148   feeding supplement (BOOST / RESOURCE BREEZE) liquid 1 Container  1 Container Oral TID BM Britt Bottom, PA-C   1 Container at 07/21/22 2148   gabapentin (NEURONTIN) capsule 100 mg  100 mg Oral TID Aggie Moats M, PA-C   100 mg at 07/21/22 2147   levothyroxine (SYNTHROID) tablet 75 mcg  75 mcg Oral Q0600 Britt Bottom, PA-C   75 mcg at 07/22/22 0507   methocarbamol (ROBAXIN) tablet 500 mg  500 mg Oral Q6H PRN Britt Bottom, PA-C   500 mg at 07/21/22 2147   Or   methocarbamol (ROBAXIN) 500 mg in dextrose 5 % 50 mL IVPB  500 mg Intravenous Q6H PRN Gawne, Meghan M, PA-C       multivitamin with minerals tablet 1 tablet  1 tablet Oral Daily Aggie Moats M, PA-C   1 tablet at 07/21/22 0848   Oral care mouth rinse  15 mL Mouth Rinse PRN [name], MD       oxyCODONE (Oxy IR/ROXICODONE) immediate release tablet 2.5-5 mg  2.5-5 mg Oral Q4H PRN Colletta Maryland, MD   5 mg at 07/22/22 0507   pantoprazole (PROTONIX) EC tablet 40 mg  40 mg Oral Daily Aggie Moats M, PA-C   40 mg at 07/21/22 0848   polyethylene glycol (MIRALAX / GLYCOLAX) packet 17 g  17 g Oral BID Wells Guiles, DO   17 g at 07/21/22 2147   polyvinyl alcohol (LIQUIFILM TEARS) 1.4 % ophthalmic solution 1 drop  1 drop Both Eyes QID Gawne, Meghan M, PA-C   1 drop at 07/21/22 2148   pravastatin (PRAVACHOL) tablet 40 mg  40 mg Oral q1800 Britt Bottom, PA-C   40 mg at 07/21/22 1818   rOPINIRole (REQUIP) tablet 0.25 mg  0.25 mg Oral  TID Aggie Moats M, PA-C   0.25 mg at 07/21/22 2147   senna (SENOKOT) tablet 8.6 mg  1 tablet Oral BID Wells Guiles, DO   8.6 mg at 07/21/22 2147     Discharge Medications: Please see discharge summary for a list of discharge medications.  Relevant Imaging Results:  Relevant Lab Results:   Additional Information SSN: [id number].  Pt is vaccinated for covid with one booster.  Joanne Chars, LCSW

## 2022-07-27 DIAGNOSIS — E785 Hyperlipidemia, unspecified: Secondary | ICD-10-CM | POA: Diagnosis not present

## 2022-07-27 DIAGNOSIS — W19XXXD Unspecified fall, subsequent encounter: Secondary | ICD-10-CM | POA: Diagnosis not present

## 2022-07-27 DIAGNOSIS — K5900 Constipation, unspecified: Secondary | ICD-10-CM | POA: Diagnosis not present

## 2022-07-27 DIAGNOSIS — S7291XD Unspecified fracture of right femur, subsequent encounter for closed fracture with routine healing: Secondary | ICD-10-CM | POA: Diagnosis not present

## 2022-07-27 DIAGNOSIS — I482 Chronic atrial fibrillation, unspecified: Secondary | ICD-10-CM | POA: Diagnosis not present

## 2022-07-27 DIAGNOSIS — I2510 Atherosclerotic heart disease of native coronary artery without angina pectoris: Secondary | ICD-10-CM | POA: Diagnosis not present

## 2022-07-27 DIAGNOSIS — I5030 Unspecified diastolic (congestive) heart failure: Secondary | ICD-10-CM | POA: Diagnosis not present

## 2022-07-27 DIAGNOSIS — S7290XA Unspecified fracture of unspecified femur, initial encounter for closed fracture: Secondary | ICD-10-CM | POA: Diagnosis not present

## 2022-07-27 DIAGNOSIS — G2581 Restless legs syndrome: Secondary | ICD-10-CM | POA: Diagnosis not present

## 2022-07-27 DIAGNOSIS — I10 Essential (primary) hypertension: Secondary | ICD-10-CM | POA: Diagnosis not present

## 2022-07-27 DIAGNOSIS — N1832 Chronic kidney disease, stage 3b: Secondary | ICD-10-CM | POA: Diagnosis not present

## 2022-07-27 DIAGNOSIS — E039 Hypothyroidism, unspecified: Secondary | ICD-10-CM | POA: Diagnosis not present

## 2022-07-27 DIAGNOSIS — K219 Gastro-esophageal reflux disease without esophagitis: Secondary | ICD-10-CM | POA: Diagnosis not present

## 2022-07-29 DIAGNOSIS — K219 Gastro-esophageal reflux disease without esophagitis: Secondary | ICD-10-CM | POA: Diagnosis not present

## 2022-07-29 DIAGNOSIS — I2510 Atherosclerotic heart disease of native coronary artery without angina pectoris: Secondary | ICD-10-CM | POA: Diagnosis not present

## 2022-07-29 DIAGNOSIS — I5030 Unspecified diastolic (congestive) heart failure: Secondary | ICD-10-CM | POA: Diagnosis not present

## 2022-07-29 DIAGNOSIS — S7291XD Unspecified fracture of right femur, subsequent encounter for closed fracture with routine healing: Secondary | ICD-10-CM | POA: Diagnosis not present

## 2022-07-29 DIAGNOSIS — W19XXXD Unspecified fall, subsequent encounter: Secondary | ICD-10-CM | POA: Diagnosis not present

## 2022-07-29 DIAGNOSIS — K5900 Constipation, unspecified: Secondary | ICD-10-CM | POA: Diagnosis not present

## 2022-07-29 DIAGNOSIS — E039 Hypothyroidism, unspecified: Secondary | ICD-10-CM | POA: Diagnosis not present

## 2022-07-29 DIAGNOSIS — E785 Hyperlipidemia, unspecified: Secondary | ICD-10-CM | POA: Diagnosis not present

## 2022-07-29 DIAGNOSIS — G2581 Restless legs syndrome: Secondary | ICD-10-CM | POA: Diagnosis not present

## 2022-07-29 DIAGNOSIS — I10 Essential (primary) hypertension: Secondary | ICD-10-CM | POA: Diagnosis not present

## 2022-07-29 DIAGNOSIS — I482 Chronic atrial fibrillation, unspecified: Secondary | ICD-10-CM | POA: Diagnosis not present

## 2022-07-29 DIAGNOSIS — N1832 Chronic kidney disease, stage 3b: Secondary | ICD-10-CM | POA: Diagnosis not present

## 2022-07-31 DIAGNOSIS — S7291XD Unspecified fracture of right femur, subsequent encounter for closed fracture with routine healing: Secondary | ICD-10-CM | POA: Diagnosis not present

## 2022-07-31 DIAGNOSIS — E039 Hypothyroidism, unspecified: Secondary | ICD-10-CM | POA: Diagnosis not present

## 2022-07-31 DIAGNOSIS — I5030 Unspecified diastolic (congestive) heart failure: Secondary | ICD-10-CM | POA: Diagnosis not present

## 2022-07-31 DIAGNOSIS — N1832 Chronic kidney disease, stage 3b: Secondary | ICD-10-CM | POA: Diagnosis not present

## 2022-07-31 DIAGNOSIS — I482 Chronic atrial fibrillation, unspecified: Secondary | ICD-10-CM | POA: Diagnosis not present

## 2022-07-31 DIAGNOSIS — I10 Essential (primary) hypertension: Secondary | ICD-10-CM | POA: Diagnosis not present

## 2022-08-07 DIAGNOSIS — S72141D Displaced intertrochanteric fracture of right femur, subsequent encounter for closed fracture with routine healing: Secondary | ICD-10-CM | POA: Diagnosis not present

## 2022-08-11 ENCOUNTER — Ambulatory Visit: Payer: Self-pay

## 2022-08-11 NOTE — Patient Outreach (Signed)
  Care Coordination   Follow Up Visit Note   08/11/2022 Name: Jacqueline [name] MRN: [id number] DOB: 08-19-1930  Jacqueline [name] is a 89 y.o. year old female who sees Hensel, Jamal Collin, MD for primary care. I spoke with  [name] by phone today.  What matters to the patients health and wellness today?  I broke my Right Hip    Goals Addressed               This Visit's Progress     I want to keep my blood pressure at a  normal level (pt-stated)        Care Coordination Interventions: Evaluation of current treatment plan related to hypertension self management and patient's adherence to plan as established by provider Reviewed medications with patient and discussed importance of compliance Discussed plans with patient for ongoing care management follow up and provided patient with direct contact information for care management team Reviewed scheduled/upcoming provider appointments including: 03/23/22 @ 1110 am Solution-Focused Strategies employed: regarding pain in lower back pelvic area down legs.  Patient denies urinary symptoms and had a bowel movement on Tuesday.,  Problem Solving /Task Center strategies reviewed- discussed fall precautions, used heat, tylenol,  Jacqueline [name] had a fall and broke her right hip while going down some steps. She is now at home and able to walk with her rollator. Although she has not checked her blood pressure recently, she denies any chest pain or headaches. Her family is helping her with food. She was supposed to have physical therapy, but she hasn't heard anything about it. I left a message with Claiborne Billings at Dr. Debro[address] office; Sunday Spillers informed me that the referral was sent to [place]. I called [facility] and learned the status of the referral, and they are still processing it. I updated Jacqueline [name] with the latest information.        SDOH assessments and interventions completed:  No     Care Coordination Interventions:  Yes, provided   Follow up  plan: Follow up call scheduled for 09/11/22 10 am    Encounter Outcome:  Pt. Visit Completed   Lazaro Arms RN, BSN, [facility]   Phone: [phone]

## 2022-08-11 NOTE — Patient Instructions (Signed)
Visit Information  Thank you for taking time to visit with me today. Please don't hesitate to contact me if I can be of assistance to you.   Following are the goals we discussed today:   Goals Addressed               This Visit's Progress     I want to keep my blood pressure at a  normal level (pt-stated)        Care Coordination Interventions: Evaluation of current treatment plan related to hypertension self management and patient's adherence to plan as established by provider Reviewed medications with patient and discussed importance of compliance Discussed plans with patient for ongoing care management follow up and provided patient with direct contact information for care management team Reviewed scheduled/upcoming provider appointments including: 03/23/22 @ 1110 am Solution-Focused Strategies employed: regarding pain in lower back pelvic area down legs.  Patient denies urinary symptoms and had a bowel movement on Tuesday.,  Problem Solving /Task Center strategies reviewed- discussed fall precautions, used heat, tylenol,  Jacqueline [name] had a fall and broke her right hip while going down some steps. She is now at home and able to walk with her rollator. Although she has not checked her blood pressure recently, she denies any chest pain or headaches. Her family is helping her with food. She was supposed to have physical therapy, but she hasn't heard anything about it. I left a message with Claiborne Billings at Dr. Debro[address] office; Sunday Spillers informed me that the referral was sent to [place]. I called [facility] and learned the status of the referral, and they are still processing it. I updated [place] with the latest information.        Our next appointment is by telephone on 09/11/22 at 10 am  Please call the care guide team at [phone] if you need to cancel or reschedule your appointment.   If you are experiencing a Mental Health or [place] or need someone to talk to, please call  [phone]-TALK (toll free, 24 hour hotline)  Patient verbalizes understanding of instructions and care plan provided today and agrees to view in [place]. Active MyChart status and patient understanding of how to access instructions and care plan via MyChart confirmed with patient.     Lazaro Arms RN, BSN, [facility]   Phone: [phone]

## 2022-08-17 ENCOUNTER — Ambulatory Visit: Payer: HMO | Admitting: Family Medicine

## 2022-08-21 ENCOUNTER — Other Ambulatory Visit: Payer: Self-pay | Admitting: Family Medicine

## 2022-08-21 DIAGNOSIS — I480 Paroxysmal atrial fibrillation: Secondary | ICD-10-CM | POA: Diagnosis not present

## 2022-08-21 DIAGNOSIS — S72001A Fracture of unspecified part of neck of right femur, initial encounter for closed fracture: Secondary | ICD-10-CM | POA: Diagnosis not present

## 2022-08-21 DIAGNOSIS — S72101D Unspecified trochanteric fracture of right femur, subsequent encounter for closed fracture with routine healing: Secondary | ICD-10-CM | POA: Diagnosis not present

## 2022-08-21 DIAGNOSIS — I2721 Secondary pulmonary arterial hypertension: Secondary | ICD-10-CM | POA: Diagnosis not present

## 2022-08-21 DIAGNOSIS — Z7901 Long term (current) use of anticoagulants: Secondary | ICD-10-CM | POA: Diagnosis not present

## 2022-08-21 DIAGNOSIS — Z951 Presence of aortocoronary bypass graft: Secondary | ICD-10-CM | POA: Diagnosis not present

## 2022-08-21 DIAGNOSIS — I130 Hypertensive heart and chronic kidney disease with heart failure and stage 1 through stage 4 chronic kidney disease, or unspecified chronic kidney disease: Secondary | ICD-10-CM | POA: Diagnosis not present

## 2022-08-21 DIAGNOSIS — Z9181 History of falling: Secondary | ICD-10-CM | POA: Diagnosis not present

## 2022-08-21 DIAGNOSIS — I5032 Chronic diastolic (congestive) heart failure: Secondary | ICD-10-CM | POA: Diagnosis not present

## 2022-08-21 DIAGNOSIS — N1832 Chronic kidney disease, stage 3b: Secondary | ICD-10-CM | POA: Diagnosis not present

## 2022-08-21 DIAGNOSIS — S7291XA Unspecified fracture of right femur, initial encounter for closed fracture: Secondary | ICD-10-CM

## 2022-08-24 NOTE — Telephone Encounter (Signed)
Called and verified that she still needed this medication.

## 2022-08-28 DIAGNOSIS — Z7901 Long term (current) use of anticoagulants: Secondary | ICD-10-CM | POA: Diagnosis not present

## 2022-08-28 DIAGNOSIS — Z951 Presence of aortocoronary bypass graft: Secondary | ICD-10-CM | POA: Diagnosis not present

## 2022-08-28 DIAGNOSIS — Z515 Encounter for palliative care: Secondary | ICD-10-CM | POA: Diagnosis not present

## 2022-08-28 DIAGNOSIS — I480 Paroxysmal atrial fibrillation: Secondary | ICD-10-CM | POA: Diagnosis not present

## 2022-08-28 DIAGNOSIS — Z6820 Body mass index (BMI) 20.0-20.9, adult: Secondary | ICD-10-CM | POA: Diagnosis not present

## 2022-08-28 DIAGNOSIS — I5032 Chronic diastolic (congestive) heart failure: Secondary | ICD-10-CM | POA: Diagnosis not present

## 2022-08-28 DIAGNOSIS — N1832 Chronic kidney disease, stage 3b: Secondary | ICD-10-CM | POA: Diagnosis not present

## 2022-08-28 DIAGNOSIS — I130 Hypertensive heart and chronic kidney disease with heart failure and stage 1 through stage 4 chronic kidney disease, or unspecified chronic kidney disease: Secondary | ICD-10-CM | POA: Diagnosis not present

## 2022-08-28 DIAGNOSIS — I2721 Secondary pulmonary arterial hypertension: Secondary | ICD-10-CM | POA: Diagnosis not present

## 2022-08-28 DIAGNOSIS — S72001A Fracture of unspecified part of neck of right femur, initial encounter for closed fracture: Secondary | ICD-10-CM | POA: Diagnosis not present

## 2022-08-28 DIAGNOSIS — S7291XD Unspecified fracture of right femur, subsequent encounter for closed fracture with routine healing: Secondary | ICD-10-CM | POA: Diagnosis not present

## 2022-08-28 DIAGNOSIS — S72101D Unspecified trochanteric fracture of right femur, subsequent encounter for closed fracture with routine healing: Secondary | ICD-10-CM | POA: Diagnosis not present

## 2022-08-28 DIAGNOSIS — Z9181 History of falling: Secondary | ICD-10-CM | POA: Diagnosis not present

## 2022-08-28 DIAGNOSIS — D6869 Other thrombophilia: Secondary | ICD-10-CM | POA: Diagnosis not present

## 2022-08-31 ENCOUNTER — Encounter: Payer: Self-pay | Admitting: Family Medicine

## 2022-08-31 ENCOUNTER — Ambulatory Visit (INDEPENDENT_AMBULATORY_CARE_PROVIDER_SITE_OTHER): Payer: HMO | Admitting: Family Medicine

## 2022-08-31 VITALS — BP 116/58 | HR 60 | Ht 60.0 in | Wt 109.6 lb

## 2022-08-31 DIAGNOSIS — I5032 Chronic diastolic (congestive) heart failure: Secondary | ICD-10-CM

## 2022-08-31 DIAGNOSIS — Z7901 Long term (current) use of anticoagulants: Secondary | ICD-10-CM

## 2022-08-31 DIAGNOSIS — I10 Essential (primary) hypertension: Secondary | ICD-10-CM

## 2022-08-31 DIAGNOSIS — R296 Repeated falls: Secondary | ICD-10-CM

## 2022-08-31 DIAGNOSIS — I480 Paroxysmal atrial fibrillation: Secondary | ICD-10-CM

## 2022-08-31 NOTE — Patient Instructions (Signed)
I will call with blood test results I sure will miss you.  We have been together a lot of years.

## 2022-09-01 ENCOUNTER — Encounter: Payer: Self-pay | Admitting: Family Medicine

## 2022-09-01 LAB — CMP14+EGFR
ALT: 14 IU/L (ref 0–32)
AST: 25 IU/L (ref 0–40)
Albumin/Globulin Ratio: 2.1 (ref 1.2–2.2)
Albumin: 4.8 g/dL — ABNORMAL HIGH (ref 3.7–4.7)
Alkaline Phosphatase: 111 IU/L (ref 44–121)
BUN/Creatinine Ratio: 13 (ref 12–28)
BUN: 17 mg/dL (ref 8–27)
Bilirubin Total: 0.3 mg/dL (ref 0.0–1.2)
CO2: 18 mmol/L — ABNORMAL LOW (ref 20–29)
Calcium: 9.8 mg/dL (ref 8.7–10.3)
Chloride: 104 mmol/L (ref 96–106)
Creatinine, Ser: 1.36 mg/dL — ABNORMAL HIGH (ref 0.57–1.00)
Globulin, Total: 2.3 g/dL (ref 1.5–4.5)
Glucose: 89 mg/dL (ref 70–99)
Potassium: 4.3 mmol/L (ref 3.5–5.2)
Sodium: 139 mmol/L (ref 134–144)
Total Protein: 7.1 g/dL (ref 6.0–8.5)
eGFR: 37 mL/min/{1.73_m2} — ABNORMAL LOW (ref >59)

## 2022-09-01 LAB — CBC
Hematocrit: 39 % (ref 34.0–46.6)
Hemoglobin: 12.5 g/dL (ref 11.1–15.9)
MCH: 31.3 pg (ref 26.6–33.0)
MCHC: 32.1 g/dL (ref 31.5–35.7)
MCV: 98 fL — ABNORMAL HIGH (ref 79–97)
Platelets: 229 10*3/uL (ref 150–450)
RBC: 3.99 x10E6/uL (ref 3.77–5.28)
RDW: 13.8 % (ref 11.7–15.4)
WBC: 4.3 10*3/uL (ref 3.4–10.8)

## 2022-09-01 NOTE — Progress Notes (Signed)
    SUBJECTIVE:   CHIEF COMPLAINT / HPI:   FU hip fracture.  Despite being quite careful, Jacqueline [name] took a misstep on the stairs on 07/17/22, fell and fractured her right hip.  She was hospitalized, had ORIF, a rehab stay and is now back home and doing well.  She lives alone but has tremendous family support.   She is doing well.  Home PT has begun.  She walks with a walker.  Denies CP.  Has some SOB or exertion, but attributes that to age and conditioning.  She is on apixiban both for post op hip and PAF.  Denies lightheadedness.    Has a history of difficult to control hypertension.      OBJECTIVE:   BP (!) 116/58   Pulse 60   Ht 5' (1.524 m)   Wt 109 lb 9.6 oz (49.7 kg)   SpO2 98%   BMI 21.40 kg/m   BP noted to be borderline low.   Lungs clear Cardiac RRR without m or g Ext no edema Gait: Seems steady and confident with rolling walker.  ASSESSMENT/PLAN:   Hypertension No change I BP meds despite lowish BP.  She is asymptomatic and BP has been difficult to control.  Frequent falls Continue PT.  Use walker religiously.  She knows she needs to be careful at all times.    Paroxysmal atrial fibrillation (HCC) CBC drawn to help with risk benefit of long term DOAC Already on the smaller dose.  CBC has returned to normal.  Continue DOAC.     [name], MD [place]

## 2022-09-01 NOTE — Assessment & Plan Note (Signed)
CBC drawn to help with risk benefit of long term DOAC Already on the smaller dose.  CBC has returned to normal.  Continue DOAC.

## 2022-09-01 NOTE — Assessment & Plan Note (Signed)
No change I BP meds despite lowish BP.  She is asymptomatic and BP has been difficult to control.

## 2022-09-01 NOTE — Assessment & Plan Note (Signed)
Continue PT.  Use walker religiously.  She knows she needs to be careful at all times.

## 2022-09-02 DIAGNOSIS — S72141D Displaced intertrochanteric fracture of right femur, subsequent encounter for closed fracture with routine healing: Secondary | ICD-10-CM | POA: Diagnosis not present

## 2022-09-09 DIAGNOSIS — H16223 Keratoconjunctivitis sicca, not specified as Sjogren's, bilateral: Secondary | ICD-10-CM | POA: Diagnosis not present

## 2022-09-09 DIAGNOSIS — Z961 Presence of intraocular lens: Secondary | ICD-10-CM | POA: Diagnosis not present

## 2022-09-09 DIAGNOSIS — H04123 Dry eye syndrome of bilateral lacrimal glands: Secondary | ICD-10-CM | POA: Diagnosis not present

## 2022-09-11 ENCOUNTER — Ambulatory Visit: Payer: Self-pay

## 2022-09-11 NOTE — Patient Outreach (Signed)
  Care Coordination   Follow Up Visit Note   09/11/2022 Name: Jacqueline [name] MRN: [id number] DOB: 01-14-1931  Jacqueline [name] is a 89 y.o. year old female who sees Pray, Norwood Levo, MD for primary care. I spoke with  [name] by phone today.  What matters to the patients health and wellness today?  Jacqueline [name] has reported that her right hip is feeling better, however, she is currently experiencing pain in her knee and foot as a result of the physical therapy exercises she performed yesterday. The pain is rated at 8 out of 10. She has taken Tylenol and applied Deep Blue rub. Her blood pressure was 152/78 with a heart rate of 73 due to the pain today, butyesterday before exercises it was 134/74 with a heart rate of 73. She is able to eat and sleep without any issues and has gained approximately 5 pounds without any swelling. A follow-up Ortho appointment has been scheduled for March 6th.    Goals Addressed               This Visit's Progress     I want to keep my blood pressure at a  normal level (pt-stated)        Patient Goals/Self Care Activities: -Patient/Caregiver will self-administer medications as prescribed as evidenced by self-report/primary caregiver report  -Patient/Caregiver will attend all scheduled provider appointments as evidenced by clinician review of documented attendance to scheduled appointments and patient/caregiver report -Patient/Caregiver will call pharmacy for medication refills as evidenced by patient report and review of pharmacy fill history as appropriate -Patient/Caregiver will call provider office for new concerns or questions as evidenced by review of documented incoming telephone call notes and patient report -Patient/Caregiver verbalizes understanding of plan -Patient/Caregiver will focus on medication adherence by taking medications as prescribed  -Calls provider office for new concerns, questions, or BP outside discussed parameters -Checks BP  and records as discussed -Follows a low sodium diet/DASH diet          SDOH assessments and interventions completed:  No     Care Coordination Interventions:  Yes, provided   Interventions Today    Flowsheet Row Most Recent Value  Chronic Disease   Chronic disease during today's visit Hypertension (HTN)  General Interventions   General Interventions Discussed/Reviewed General Interventions Reviewed        Follow up plan: Follow up call scheduled for 10/09/22 10 am    Encounter Outcome:  Pt. Visit Completed   Lazaro Arms RN, BSN, [facility]   Phone: [phone]

## 2022-09-11 NOTE — Patient Instructions (Signed)
Visit Information  Thank you for taking time to visit with me today. Please don't hesitate to contact me if I can be of assistance to you.   Following are the goals we discussed today:   Goals Addressed               This Visit's Progress     I want to keep my blood pressure at a  normal level (pt-stated)        Patient Goals/Self Care Activities: -Patient/Caregiver will self-administer medications as prescribed as evidenced by self-report/primary caregiver report  -Patient/Caregiver will attend all scheduled provider appointments as evidenced by clinician review of documented attendance to scheduled appointments and patient/caregiver report -Patient/Caregiver will call pharmacy for medication refills as evidenced by patient report and review of pharmacy fill history as appropriate -Patient/Caregiver will call provider office for new concerns or questions as evidenced by review of documented incoming telephone call notes and patient report -Patient/Caregiver verbalizes understanding of plan -Patient/Caregiver will focus on medication adherence by taking medications as prescribed  -Calls provider office for new concerns, questions, or BP outside discussed parameters -Checks BP and records as discussed -Follows a low sodium diet/DASH diet          Our next appointment is by telephone on 10/09/22 at 10 am  Please call the care guide team at [phone] if you need to cancel or reschedule your appointment.   If you are experiencing a Mental Health or [place] or need someone to talk to, please call 1-800-273-TALK (toll free, 24 hour hotline)  Patient verbalizes understanding of instructions and care plan provided today and agrees to view in [place]. Active MyChart status and patient understanding of how to access instructions and care plan via MyChart confirmed with patient.     Lazaro Arms RN, BSN, [facility]   Phone:  [phone]

## 2022-09-23 DIAGNOSIS — S72101D Unspecified trochanteric fracture of right femur, subsequent encounter for closed fracture with routine healing: Secondary | ICD-10-CM | POA: Diagnosis not present

## 2022-09-23 DIAGNOSIS — I2721 Secondary pulmonary arterial hypertension: Secondary | ICD-10-CM | POA: Diagnosis not present

## 2022-09-23 DIAGNOSIS — Z951 Presence of aortocoronary bypass graft: Secondary | ICD-10-CM | POA: Diagnosis not present

## 2022-09-23 DIAGNOSIS — I480 Paroxysmal atrial fibrillation: Secondary | ICD-10-CM | POA: Diagnosis not present

## 2022-09-23 DIAGNOSIS — I5032 Chronic diastolic (congestive) heart failure: Secondary | ICD-10-CM | POA: Diagnosis not present

## 2022-09-23 DIAGNOSIS — Z9181 History of falling: Secondary | ICD-10-CM | POA: Diagnosis not present

## 2022-09-23 DIAGNOSIS — I130 Hypertensive heart and chronic kidney disease with heart failure and stage 1 through stage 4 chronic kidney disease, or unspecified chronic kidney disease: Secondary | ICD-10-CM | POA: Diagnosis not present

## 2022-09-23 DIAGNOSIS — N1832 Chronic kidney disease, stage 3b: Secondary | ICD-10-CM | POA: Diagnosis not present

## 2022-09-23 DIAGNOSIS — S72001A Fracture of unspecified part of neck of right femur, initial encounter for closed fracture: Secondary | ICD-10-CM | POA: Diagnosis not present

## 2022-09-23 DIAGNOSIS — Z7901 Long term (current) use of anticoagulants: Secondary | ICD-10-CM | POA: Diagnosis not present

## 2022-09-30 DIAGNOSIS — I2721 Secondary pulmonary arterial hypertension: Secondary | ICD-10-CM | POA: Diagnosis not present

## 2022-09-30 DIAGNOSIS — S72001A Fracture of unspecified part of neck of right femur, initial encounter for closed fracture: Secondary | ICD-10-CM | POA: Diagnosis not present

## 2022-09-30 DIAGNOSIS — Z951 Presence of aortocoronary bypass graft: Secondary | ICD-10-CM | POA: Diagnosis not present

## 2022-09-30 DIAGNOSIS — S72101D Unspecified trochanteric fracture of right femur, subsequent encounter for closed fracture with routine healing: Secondary | ICD-10-CM | POA: Diagnosis not present

## 2022-09-30 DIAGNOSIS — Z9181 History of falling: Secondary | ICD-10-CM | POA: Diagnosis not present

## 2022-09-30 DIAGNOSIS — I130 Hypertensive heart and chronic kidney disease with heart failure and stage 1 through stage 4 chronic kidney disease, or unspecified chronic kidney disease: Secondary | ICD-10-CM | POA: Diagnosis not present

## 2022-09-30 DIAGNOSIS — I5032 Chronic diastolic (congestive) heart failure: Secondary | ICD-10-CM | POA: Diagnosis not present

## 2022-09-30 DIAGNOSIS — I480 Paroxysmal atrial fibrillation: Secondary | ICD-10-CM | POA: Diagnosis not present

## 2022-09-30 DIAGNOSIS — N1832 Chronic kidney disease, stage 3b: Secondary | ICD-10-CM | POA: Diagnosis not present

## 2022-09-30 DIAGNOSIS — Z7901 Long term (current) use of anticoagulants: Secondary | ICD-10-CM | POA: Diagnosis not present

## 2022-10-02 DIAGNOSIS — S72141D Displaced intertrochanteric fracture of right femur, subsequent encounter for closed fracture with routine healing: Secondary | ICD-10-CM | POA: Diagnosis not present

## 2022-10-06 DIAGNOSIS — Z9181 History of falling: Secondary | ICD-10-CM | POA: Diagnosis not present

## 2022-10-06 DIAGNOSIS — S72001A Fracture of unspecified part of neck of right femur, initial encounter for closed fracture: Secondary | ICD-10-CM | POA: Diagnosis not present

## 2022-10-06 DIAGNOSIS — S72101D Unspecified trochanteric fracture of right femur, subsequent encounter for closed fracture with routine healing: Secondary | ICD-10-CM | POA: Diagnosis not present

## 2022-10-06 DIAGNOSIS — Z7901 Long term (current) use of anticoagulants: Secondary | ICD-10-CM | POA: Diagnosis not present

## 2022-10-06 DIAGNOSIS — I5032 Chronic diastolic (congestive) heart failure: Secondary | ICD-10-CM | POA: Diagnosis not present

## 2022-10-06 DIAGNOSIS — I130 Hypertensive heart and chronic kidney disease with heart failure and stage 1 through stage 4 chronic kidney disease, or unspecified chronic kidney disease: Secondary | ICD-10-CM | POA: Diagnosis not present

## 2022-10-06 DIAGNOSIS — N1832 Chronic kidney disease, stage 3b: Secondary | ICD-10-CM | POA: Diagnosis not present

## 2022-10-06 DIAGNOSIS — I2721 Secondary pulmonary arterial hypertension: Secondary | ICD-10-CM | POA: Diagnosis not present

## 2022-10-06 DIAGNOSIS — I480 Paroxysmal atrial fibrillation: Secondary | ICD-10-CM | POA: Diagnosis not present

## 2022-10-06 DIAGNOSIS — Z951 Presence of aortocoronary bypass graft: Secondary | ICD-10-CM | POA: Diagnosis not present

## 2022-10-07 ENCOUNTER — Ambulatory Visit: Payer: Self-pay

## 2022-10-07 NOTE — Patient Outreach (Signed)
  Care Coordination   Follow Up Visit Note   10/07/2022 Name: Saragrace Selke MRN: [id number] DOB: 06/06/1931  Elnoria Howard Ambrosino is a 89 y.o. year old female who sees Pray, Norwood Levo, MD for primary care. I spoke with  [name] by phone today.  What matters to the patients health and wellness today?  Mrs. Prindiville mentioned that she was in pain, but she assured me that she's doing alright. Yesterday, she had therapy to work on her hip, which is healing well. On Friday, she visited the surgeon who explained that the pelvic area would take longer to heal, and that's where she experiences the most pain, rated as an 8 out of 10. To alleviate the pain, she's using a heating pad. For mobility, she's walking with a walker and a cane. Her blood pressure has been consistently good, and today she hasn't taken it yet. Her most recent reading was 138/72 with a pulse rate of 60. I advised her to take it one day at a time, continue using her walker and cane for support and to take her medication as prescribed.    Goals Addressed               This Visit's Progress     I want to keep my blood pressure at a  normal level (pt-stated)        Patient Goals/Self Care Activities: -Patient/Caregiver will self-administer medications as prescribed as evidenced by self-report/primary caregiver report  -Patient/Caregiver will attend all scheduled provider appointments as evidenced by clinician review of documented attendance to scheduled appointments and patient/caregiver report -Patient/Caregiver will call pharmacy for medication refills as evidenced by patient report and review of pharmacy fill history as appropriate -Patient/Caregiver will call provider office for new concerns or questions as evidenced by review of documented incoming telephone call notes and patient report -Patient/Caregiver verbalizes understanding of plan -Patient/Caregiver will focus on medication adherence by taking medications as  prescribed  -Calls provider office for new concerns, questions, or BP outside discussed parameters -Checks BP and records as discussed          SDOH assessments and interventions completed:  No     Care Coordination Interventions:  Yes, provided   Interventions Today    Flowsheet Row Most Recent Value  Chronic Disease   Chronic disease during today's visit Hypertension (HTN)  General Interventions   General Interventions Discussed/Reviewed General Interventions Reviewed  Exercise Interventions   Exercise Discussed/Reviewed Physical Activity  Safety Interventions   Safety Discussed/Reviewed Safety Discussed, Safety Reviewed, Fall Risk        Follow up plan: Follow up call scheduled for 11/11/22  10 am    Encounter Outcome:  Pt. Visit Completed   Lazaro Arms RN, BSN, [facility]   Phone: [phone]

## 2022-10-07 NOTE — Patient Instructions (Signed)
Visit Information  Thank you for taking time to visit with me today. Please don't hesitate to contact me if I can be of assistance to you.   Following are the goals we discussed today:   Goals Addressed               This Visit's Progress     I want to keep my blood pressure at a  normal level (pt-stated)        Patient Goals/Self Care Activities: -Patient/Caregiver will self-administer medications as prescribed as evidenced by self-report/primary caregiver report  -Patient/Caregiver will attend all scheduled provider appointments as evidenced by clinician review of documented attendance to scheduled appointments and patient/caregiver report -Patient/Caregiver will call pharmacy for medication refills as evidenced by patient report and review of pharmacy fill history as appropriate -Patient/Caregiver will call provider office for new concerns or questions as evidenced by review of documented incoming telephone call notes and patient report -Patient/Caregiver verbalizes understanding of plan -Patient/Caregiver will focus on medication adherence by taking medications as prescribed  -Calls provider office for new concerns, questions, or BP outside discussed parameters -Checks BP and records as discussed          Our next appointment is by telephone on 11/11/22 at 10 am  Please call the care guide team at [phone] if you need to cancel or reschedule your appointment.   If you are experiencing a Mental Health or [place] or need someone to talk to, please call 1-800-273-TALK (toll free, 24 hour hotline)  The patient verbalized understanding of instructions, educational materials, and care plan provided today and agreed to receive a mailed copy of patient instructions, educational materials, and care plan.   Lazaro Arms RN, BSN, [facility]   Phone: [phone]

## 2022-10-13 ENCOUNTER — Telehealth: Payer: Self-pay | Admitting: Cardiovascular Disease

## 2022-10-13 MED ORDER — APIXABAN 2.5 MG PO TABS: 2.5000 mg | ORAL_TABLET | Freq: Two times a day (BID) | ORAL | 1 refills | Status: DC

## 2022-10-13 NOTE — Telephone Encounter (Signed)
*  STAT* If patient is at the pharmacy, call can be transferred to refill team.   1. Which medications need to be refilled? (please list name of each medication and dose if known) apixaban (ELIQUIS) 2.5 MG TABS tablet   2. Which pharmacy/location (including street and city if local pharmacy) is medication to be sent to? [id number] [place], [place] - [address]   3. Do they need a 30 day or 90 day supply? 90 day

## 2022-10-13 NOTE — Telephone Encounter (Signed)
Prescription refill request for Eliquis received. Indication: afib  Last office visit: 07/14/2022, Croitoru Scr: 1.36, 08/31/2022 Age: 89 yo  Weight: 49.7 kg

## 2022-10-15 DIAGNOSIS — S72101D Unspecified trochanteric fracture of right femur, subsequent encounter for closed fracture with routine healing: Secondary | ICD-10-CM | POA: Diagnosis not present

## 2022-10-15 DIAGNOSIS — N1832 Chronic kidney disease, stage 3b: Secondary | ICD-10-CM | POA: Diagnosis not present

## 2022-10-15 DIAGNOSIS — I5032 Chronic diastolic (congestive) heart failure: Secondary | ICD-10-CM | POA: Diagnosis not present

## 2022-10-15 DIAGNOSIS — I480 Paroxysmal atrial fibrillation: Secondary | ICD-10-CM | POA: Diagnosis not present

## 2022-10-15 DIAGNOSIS — Z9181 History of falling: Secondary | ICD-10-CM | POA: Diagnosis not present

## 2022-10-15 DIAGNOSIS — I130 Hypertensive heart and chronic kidney disease with heart failure and stage 1 through stage 4 chronic kidney disease, or unspecified chronic kidney disease: Secondary | ICD-10-CM | POA: Diagnosis not present

## 2022-10-15 DIAGNOSIS — Z951 Presence of aortocoronary bypass graft: Secondary | ICD-10-CM | POA: Diagnosis not present

## 2022-10-15 DIAGNOSIS — Z7901 Long term (current) use of anticoagulants: Secondary | ICD-10-CM | POA: Diagnosis not present

## 2022-10-15 DIAGNOSIS — S72001A Fracture of unspecified part of neck of right femur, initial encounter for closed fracture: Secondary | ICD-10-CM | POA: Diagnosis not present

## 2022-10-15 DIAGNOSIS — I2721 Secondary pulmonary arterial hypertension: Secondary | ICD-10-CM | POA: Diagnosis not present

## 2022-10-30 DIAGNOSIS — I480 Paroxysmal atrial fibrillation: Secondary | ICD-10-CM | POA: Diagnosis not present

## 2022-10-30 DIAGNOSIS — H547 Unspecified visual loss: Secondary | ICD-10-CM | POA: Diagnosis not present

## 2022-10-30 DIAGNOSIS — Z7901 Long term (current) use of anticoagulants: Secondary | ICD-10-CM | POA: Diagnosis not present

## 2022-10-30 DIAGNOSIS — Z515 Encounter for palliative care: Secondary | ICD-10-CM | POA: Diagnosis not present

## 2022-11-09 ENCOUNTER — Telehealth: Payer: Self-pay | Admitting: Family Medicine

## 2022-11-09 NOTE — Telephone Encounter (Signed)
Called patient to schedule Medicare Annual Wellness Visit (AWV). Left message for patient to call back and schedule Medicare Annual Wellness Visit (AWV).  Last date of AWV: 07/18/2018   Please schedule an AWVS appointment at any time with [facility] VISIT.  If any questions, please contact me at [phone].    Thank you,  [facility] Support [facility] Direct dial  [phone]

## 2022-11-11 ENCOUNTER — Ambulatory Visit: Payer: Self-pay

## 2022-11-11 ENCOUNTER — Telehealth: Payer: Self-pay | Admitting: Family Medicine

## 2022-11-11 NOTE — Telephone Encounter (Signed)
Contacted [place] to schedule their annual wellness visit. Appointment made for 11/12/2022.  Thank you,  [facility] Support [facility] Direct dial  [phone]

## 2022-11-11 NOTE — Patient Outreach (Signed)
  Care Coordination   Follow Up Visit Note   11/11/2022 Name: Jacqueline [name] MRN: [id number] DOB: April 07, 1931  Jacqueline [name] is a 89 y.o. year old female who sees Pray, Milus Mallick, MD for primary care. I spoke with  [name] by phone today.  What matters to the patients health and wellness today?  Jacqueline [name] is feeling exhausted. She recently turned 89 and has been celebrating for the last two weeks. Yesterday, her blood pressure was 163/63, with a heart rate of 69. Today, her blood pressure is 132/64, with a heart rate of 59. However, she denies experiencing any headaches or chest pain. [name] is taking her medications as prescribed and using her walker to move around. I informed her that the office has been trying to reach her for an AWV.    Goals Addressed               This Visit's Progress     I want to keep my blood pressure at a  normal level (pt-stated)        Patient Goals/Self Care Activities: -Patient/Caregiver will self-administer medications as prescribed as evidenced by self-report/primary caregiver report  -Patient/Caregiver will attend all scheduled provider appointments as evidenced by clinician review of documented attendance to scheduled appointments and patient/caregiver report -Patient/Caregiver will call pharmacy for medication refills as evidenced by patient report and review of pharmacy fill history as appropriate -Patient/Caregiver will call provider office for new concerns or questions as evidenced by review of documented incoming telephone call notes and patient report -Patient/Caregiver verbalizes understanding of plan -Patient/Caregiver will focus on medication adherence by taking medications as prescribed  -Calls provider office for new concerns, questions, or BP outside discussed parameters -Checks BP and records as discussed -Continue to walk with your walker          SDOH assessments and interventions completed:  No     Care  Coordination Interventions:  Yes, provided   Follow up plan: Follow up call scheduled for 12/11/22  11 am    Encounter Outcome:  Pt. Visit Completed   Juanell Fairly RN, BSN, [facility] Care Coordinator [facility]   Phone: [phone]

## 2022-11-11 NOTE — Patient Instructions (Signed)
Visit Information  Thank you for taking time to visit with me today. Please don't hesitate to contact me if I can be of assistance to you.   Following are the goals we discussed today:   Goals Addressed               This Visit's Progress     I want to keep my blood pressure at a  normal level (pt-stated)        Patient Goals/Self Care Activities: -Patient/Caregiver will self-administer medications as prescribed as evidenced by self-report/primary caregiver report  -Patient/Caregiver will attend all scheduled provider appointments as evidenced by clinician review of documented attendance to scheduled appointments and patient/caregiver report -Patient/Caregiver will call pharmacy for medication refills as evidenced by patient report and review of pharmacy fill history as appropriate -Patient/Caregiver will call provider office for new concerns or questions as evidenced by review of documented incoming telephone call notes and patient report -Patient/Caregiver verbalizes understanding of plan -Patient/Caregiver will focus on medication adherence by taking medications as prescribed  -Calls provider office for new concerns, questions, or BP outside discussed parameters -Checks BP and records as discussed -Continue to walk with your walker          Our next appointment is by telephone on 12/11/22 at 11 am  Please call the care guide team at [phone] if you need to cancel or reschedule your appointment.   If you are experiencing a Mental Health or Behavioral Health Crisis or need someone to talk to, please call 1-800-273-TALK (toll free, 24 hour hotline)  Patient verbalizes understanding of instructions and care plan provided today and agrees to view in MyChart. Active MyChart status and patient understanding of how to access instructions and care plan via MyChart confirmed with patient.     Juanell Fairly RN, BSN, [facility] Care Coordinator [facility]   Phone: [phone]

## 2022-11-12 ENCOUNTER — Ambulatory Visit (INDEPENDENT_AMBULATORY_CARE_PROVIDER_SITE_OTHER): Payer: HMO

## 2022-11-12 VITALS — Ht 61.0 in | Wt 115.0 lb

## 2022-11-12 DIAGNOSIS — Z0000 Encounter for general adult medical examination without abnormal findings: Secondary | ICD-10-CM

## 2022-11-12 NOTE — Progress Notes (Addendum)
Subjective:   Jacqueline [name] is a 89 y.o. female who presents for Medicare Annual (Subsequent) preventive examination.  Review of Systems    Virtual Visit via Telephone Note  I connected with  [name] on 11/12/22 at 10:15 AM EDT by telephone and verified that I am speaking with the correct person using two identifiers.  Location: Patient: Home Provider: Office Persons participating in the virtual visit: patient/Nurse Health Advisor   I discussed the limitations, risks, security and privacy concerns of performing an evaluation and management service by telephone and the availability of in person appointments. The patient expressed understanding and agreed to proceed.  Interactive audio and video telecommunications were attempted between this nurse and patient, however failed, due to patient having technical difficulties OR patient did not have access to video capability.  We continued and completed visit with audio only.  Some vital signs may be absent or patient reported.   Tillie Rung, LPN  Cardiac Risk Factors include: advanced age (>37men, >58 women);dyslipidemia;hypertension     Objective:    Today's Vitals   11/12/22 1015  Weight: 115 lb (52.2 kg)  Height:  (1.549 m)   Body mass index is 21.73 kg/m.     11/12/2022   10:25 AM 08/31/2022    9:49 AM 07/17/2022    8:06 PM 05/25/2022   10:07 AM 05/07/2022    9:30 AM 03/23/2022    9:51 AM 02/09/2022    5:19 PM  Advanced Directives  Does Patient Have a Medical Advance Directive? Yes No No Yes No No No  Type of Estate agent of [place];Living will   Healthcare Power of [place];Living will     Copy of Healthcare Power of Attorney in Chart? No - copy requested   No - copy requested     Would patient like information on creating a medical advance directive?  No - Patient declined No - Patient declined  No - Patient declined  No - Patient declined    Current Medications  (verified) Outpatient Encounter Medications as of 11/12/2022  Medication Sig   traMADol (ULTRAM) 50 MG tablet TAKE 1 TABLET BY MOUTH UP TO EVERY 8 HOURS AS NEEDED.   acetaminophen (TYLENOL) 325 MG tablet Take 2 tablets (650 mg total) by mouth every 6 (six) hours.   ALPRAZolam (XANAX) 0.5 MG tablet TAKE 1 TABLET BY MOUTH AT BEDTIME.   amiodarone (PACERONE) 200 MG tablet TAKE 1 TABLET BY MOUTH EVERY DAY (Patient taking differently: Take 200 mg by mouth daily.)   amLODipine (NORVASC) 5 MG tablet Take 5 mg by mouth daily.   apixaban (ELIQUIS) 2.5 MG TABS tablet Take 1 tablet (2.5 mg total) by mouth 2 (two) times daily.   cholecalciferol (VITAMIN D3) 25 MCG (1000 UNIT) tablet Take 1,000 Units by mouth daily.   cycloSPORINE (RESTASIS) 0.05 % ophthalmic emulsion Place 1 drop into both eyes 2 (two) times daily.   gabapentin (NEURONTIN) 100 MG capsule TAKE 1 CAPSULE (100 MG TOTAL) BY MOUTH THREE TIMES DAILY. (Patient taking differently: Take 100 mg by mouth 3 (three) times daily.)   levothyroxine (SYNTHROID) 75 MCG tablet TAKE 1 TABLET BY MOUTH EVERY DAY (Patient taking differently: Take 75 mcg by mouth daily before breakfast.)   Multiple Vitamin (MULTIVITAMIN WITH MINERALS) TABS tablet Take 1 tablet by mouth daily.   nitroGLYCERIN (NITROSTAT) 0.4 MG SL tablet Place 1 tablet (0.4 mg total) under the tongue every 5 (five) minutes as needed for chest pain.  Omega-3 Fatty Acids (FISH OIL) 1000 MG CAPS Take 1,000 mg by mouth every evening.   omeprazole (PRILOSEC) 20 MG capsule TAKE 1 CAPSULE BY MOUTH EVERY DAY (Patient taking differently: Take 20 mg by mouth daily.)   Polyethyl Glycol-Propyl Glycol (LUBRICANT EYE DROPS) 0.4-0.3 % SOLN Place 1 drop into both eyes 4 (four) times daily.   polyethylene glycol (MIRALAX / GLYCOLAX) 17 g packet Take 17 g by mouth 2 (two) times daily. (Patient taking differently: Take 17 g by mouth daily as needed for mild constipation.)   pravastatin (PRAVACHOL) 40 MG tablet TAKE 1  TABLET BY MOUTH EVERY DAY IN THE EVENING (Patient taking differently: Take 40 mg by mouth every evening.)   rOPINIRole (REQUIP) 0.25 MG tablet TAKE 1 TABLET BY MOUTH THREE TIMES A DAY (Patient taking differently: Take 0.25 mg by mouth 3 (three) times daily.)   senna-docusate (SENOKOT-S) 8.6-50 MG tablet Take 2 tablets by mouth 2 (two) times daily. (Patient taking differently: Take 2 tablets by mouth at bedtime as needed for mild constipation.)   No facility-administered encounter medications on file as of 11/12/2022.    Allergies (verified) Atorvastatin, Beta adrenergic blockers, Crestor [rosuvastatin], Morphine and related, and Prednisone   History: Past Medical History:  Diagnosis Date   Arthritis    back   Atrial fibrillation with rapid ventricular response 02/2014   a. CHA2DS2VASc = 6 -> on eliquis;  b. 02/2014 s/p DCCV;  c. 08/2014 Echo: EF 55-60%, Gr 2 DD, mild MR, triv AI.   CAD (coronary artery disease)    a. s/p CABG x 2 (LIMA->LAD, VG->Diag);  b. 08/2014 Cath: LM nl, LAD 90p, LCX nl, RCA nl/dominant, LIMA->LAD atretic, VG->D2 patent w/ retrograde filling of LAD, EF 55-60%-->Med Rx.   Diverticulitis 02/21/2012   GERD (gastroesophageal reflux disease)    Hyperlipemia    Hypertension    Insomnia    Past Surgical History:  Procedure Laterality Date   ABDOMINAL HYSTERECTOMY  1975   CARDIAC CATHETERIZATION N/A 05/09/2015   Procedure: Right Heart Cath;  Surgeon: Laurey Morale, MD;  Location: [facility] INVASIVE CV LAB;  Service: Cardiovascular;  Laterality: N/A;   CARDIOVERSION N/A 02/28/2014   Procedure: CARDIOVERSION;  Surgeon: Thurmon Fair, MD;  Location: [facility] ENDOSCOPY;  Service: Cardiovascular;  Laterality: N/A;   CARDIOVERSION N/A 01/30/2019   Procedure: CARDIOVERSION;  Surgeon: Pricilla Riffle, MD;  Location: [facility] ENDOSCOPY;  Service: Cardiovascular;  Laterality: N/A;   CATARACT EXTRACTION Bilateral 2014   with lens implanted   COLONOSCOPY N/A 02/19/2015   Procedure: COLONOSCOPY;  Surgeon:  Meryl Dare, MD;  Location: [facility] ENDOSCOPY;  Service: Endoscopy;  Laterality: N/A;   CORONARY ARTERY BYPASS GRAFT  2006   off-pump bypass surgery with LIMA to the LAD and SVG to the second diagonal artery ( performed by Dr Tyrone Sage)    FEMUR IM NAIL Right 07/18/2022   Procedure: INTRAMEDULLARY (IM) NAIL, HIP;  Surgeon: Sheral Apley, MD;  Location: [facility] OR;  Service: Orthopedics;  Laterality: Right;   JOINT REPLACEMENT Bilateral    L-2004, R-2006   LEFT HEART CATHETERIZATION WITH CORONARY/GRAFT ANGIOGRAM N/A 09/17/2014   Procedure: LEFT HEART CATHETERIZATION WITH Isabel Caprice;  Surgeon: Lennette Bihari, MD;  Location: [facility] CATH LAB;  Service: Cardiovascular;  Laterality: N/A;   REIMPLANTATION OF TOTAL KNEE Right 10/08/2014   Procedure: REIMPLANTATION OF RIGHT TOTAL KNEE ARTHROPLASTY WITH REMOVAL OF ANTIBIOTIC SPACER;  Surgeon: Durene Romans, MD;  Location: [facility] ORS;  Service: Orthopedics;  Laterality: Right;   ROTATOR CUFF  REPAIR Bilateral    r-1999, l- 2005   TEE WITHOUT CARDIOVERSION N/A 02/28/2014   Procedure: TRANSESOPHAGEAL ECHOCARDIOGRAM (TEE);  Surgeon: Thurmon Fair, MD;  Location: [facility] ENDOSCOPY;  Service: Cardiovascular;  Laterality: N/A;   TEE WITHOUT CARDIOVERSION N/A 01/30/2019   Procedure: TRANSESOPHAGEAL ECHOCARDIOGRAM (TEE);  Surgeon: Pricilla Riffle, MD;  Location: [facility] ENDOSCOPY;  Service: Cardiovascular;  Laterality: N/A;   TOTAL KNEE ARTHROPLASTY Right 07/02/2014   Procedure: Resection of Infected Right Total Knee Arthroplasty with placement antibiotic spacer;  Surgeon: Shelda Pal, MD;  Location: [facility] ORS;  Service: Orthopedics;  Laterality: Right;   Family History  Problem Relation Age of Onset   Parkinson's disease Mother    Heart attack Brother    Arthritis Brother    Cancer Brother        Unknown   Breast cancer Neg Hx    Social History   Socioeconomic History   Marital status: Widowed    Spouse name: Not on file   Number of children: 2   Years of education:  10   Highest education level: 10th grade  Occupational History   Not on file  Tobacco Use   Smoking status: Never   Smokeless tobacco: Never  Vaping Use   Vaping Use: Never used  Substance and Sexual Activity   Alcohol use: No   Drug use: No   Sexual activity: Not Currently    Birth control/protection: Post-menopausal  Other Topics Concern   Not on file  Social History Narrative   Current Social History 05/11/2017           Patient lives alone in one level home    Transportation: Patient has own vehicle and drives herself    Important Relationships Children, grandchildren, Art gallery manager and daughter-in-law are all nearby   Pets: None 05/11/2017   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 Drinks water and one cup of coffee daily.   Religious / Personal Beliefs: Baptist. "I believe Jesus died on the cross to save me [id number] my sins. I am saved by the grace of God."    Other: "I have a good life and am happy. My family is there when I need help."                                                              Social Determinants of Health   Financial Resource Strain: Low Risk  (11/12/2022)   Overall Financial Resource Strain (CARDIA)    Difficulty of Paying Living Expenses: Not hard at all  Food Insecurity: No Food Insecurity (11/12/2022)   Hunger Vital Sign    Worried About Running Out of Food in the Last Year: Never true    Ran Out of Food in the Last Year: Never true  Transportation Needs: No Transportation Needs (11/12/2022)   PRAPARE - Administrator, Civil Service (Medical): No    Lack of Transportation (Non-Medical): No  Physical Activity: Inactive (11/12/2022)   Exercise Vital Sign    Days of Exercise per Week: 0 days    Minutes of Exercise per Session: 0 min  Stress: No Stress  Concern Present (11/12/2022)    [employer] of Occupational Health - Occupational Stress Questionnaire    Feeling of Stress : Not at all  Social Connections: Moderately Integrated (11/12/2022)   Social Connection and Isolation Panel [NHANES]    Frequency of Communication with Friends and Family: More than three times a week    Frequency of Social Gatherings with Friends and Family: More than three times a week    Attends Religious Services: More than 4 times per year    Active Member of [employer] or Organizations: Yes    Attends Banker Meetings: More than 4 times per year    Marital Status: Widowed    Tobacco Counseling Counseling given: Not Answered   Clinical Intake:  Pre-visit preparation completed: No  Pain : No/denies pain     BMI - recorded: 21.73 Nutritional Status: BMI of 19-24  Normal Nutritional Risks: None Diabetes: No  How often do you need to have someone help you when you read instructions, pamphlets, or other written materials from your doctor or pharmacy?: 3 - Sometimes (Son assist)  Diabetic?  No  Interpreter Needed?: No      Activities of Daily Living    11/12/2022   10:23 AM 07/17/2022   10:00 PM  In your present state of health, do you have any difficulty performing the following activities:  Hearing? 0 0  Vision? 0 0  Difficulty concentrating or making decisions? 0 0  Walking or climbing stairs? 0 1  Dressing or bathing? 0 0  Doing errands, shopping? 0 1  Preparing Food and eating ? N   Using the Toilet? N   In the past six months, have you accidently leaked urine? N   Do you have problems with loss of bowel control? N   Managing your Medications? N   Managing your Finances? N   Housekeeping or managing your Housekeeping? N     Patient Care Team: Billey Co, MD as PCP - General (Family Medicine) Croitoru, Rachelle Hora, MD as PCP - Cardiology (Cardiology) Thurmon Fair, MD as Consulting Physician (Cardiology) Mia Creek, MD as Consulting Physician  (Ophthalmology) Duke, Roe Rutherford, PA as Physician Assistant (Cardiology) Juanell Fairly, RN as [facility] Care Management  Indicate any recent Medical Services you may have received from other than [facility] providers in the past year (date may be approximate).     Assessment:   This is a routine wellness examination for [place].  Hearing/Vision screen Hearing Screening - Comments:: Denies hearing difficulties   Vision Screening - Comments:: Wears rx glasses - up to date with routine eye exams with  Dr Valere Dross  Dietary issues and exercise activities discussed: Exercise limited by: None identified   Goals Addressed               This Visit's Progress     Stay Healthy (pt-stated)         Depression Screen    11/12/2022   10:22 AM 08/31/2022   10:01 AM 05/25/2022   10:07 AM 05/07/2022    9:35 AM 03/23/2022    9:51 AM 01/08/2022   10:53 AM 10/27/2021   10:45 AM  PHQ 2/9 Scores  PHQ - 2 Score 0 0 0 6 0 0 3  PHQ- 9 Score  2 2 25 7 4 9     Fall Risk    11/12/2022   10:23 AM 08/31/2022   10:01 AM 08/31/2022    9:52 AM 05/25/2022   10:02 AM 05/07/2022  9:34 AM  Fall Risk   Falls in the past year? Number falls in past yr: 0 0 0 0 1  Injury with Fall? 1    1  Comment Fx hip followed by medical attention.      Risk for fall due to : Impaired balance/gait      Follow up Falls prevention discussed        FALL RISK PREVENTION PERTAINING TO THE HOME:  Any stairs in or around the home? Yes  If so, are there any without handrails? No  Home free of loose throw rugs in walkways, pet beds, electrical cords, etc? Yes  Adequate lighting in your home to reduce risk of falls? Yes   ASSISTIVE DEVICES UTILIZED TO PREVENT FALLS:  Life alert? Yes  Use of a cane, walker or w/c? Yes  Grab bars in the bathroom? Yes  Shower chair or bench in shower? Yes  Elevated toilet seat or a handicapped toilet? Yes   TIMED UP AND GO:  Was the test performed? No . Audio  visit   Cognitive Function:    07/18/2018    2:32 PM  MMSE - Mini Mental State Exam  Orientation to time 5  Orientation to Place 5  Registration 3  Attention/ Calculation 5  Recall 3  Language- name 2 objects 2  Language- repeat 1  Language- follow 3 step command 3  Language- read & follow direction 1  Write a sentence 1  Copy design 1  Total score 30        11/12/2022   10:25 AM 07/18/2018    2:33 PM  6CIT Screen  What Year? 0 points 0 points  What month? 0 points 0 points  What time? 0 points 0 points  Count back from 20 0 points 0 points  Months in reverse 4 points 0 points  Repeat phrase 0 points 0 points  Total Score 4 points 0 points    Immunizations Immunization History  Administered Date(s) Administered   Fluad Quad(high Dose 65+) 04/23/2021, 05/07/2022   Influenza Split 05/01/2011, 04/19/2012   Influenza Whole 05/05/2007, 04/20/2008, 05/16/2008, 05/02/2009, 04/23/2010   Influenza,inj,Quad PF,6+ Mos 05/04/2013, 05/09/2014, 03/27/2015, 04/08/2016, 03/24/2017, 07/18/2018, 03/23/2019   PFIZER(Purple Top)SARS-COV-2 Vaccination 09/10/2019, 10/03/2019, 05/16/2020   Pneumococcal Conjugate-13 12/22/2013   Pneumococcal Polysaccharide-23 10/25/1997, 03/04/2012   Respiratory Syncytial Virus Vaccine,Recomb Aduvanted(Arexvy) 05/26/2022   Td 10/25/1997, 12/30/2007   Zoster Recombinat (Shingrix) 01/09/2022    TDAP status: Due, Education has been provided regarding the importance of this vaccine. Advised may receive this vaccine at local pharmacy or Health Dept. Aware to provide a copy of the vaccination record if obtained from local pharmacy or Health Dept. Verbalized acceptance and understanding.  Flu Vaccine status: Up to date  Pneumococcal vaccine status: Up to date  Covid-19 vaccine status: Completed vaccines  Qualifies for Shingles Vaccine? Yes   Zostavax completed Yes   Shingrix Completed?: Yes  Screening Tests Health Maintenance  Topic Date Due    DTaP/Tdap/Td (3 - Tdap) 12/29/2017   COVID-19 Vaccine (4 - 2023-24 season) 11/28/2022 (Originally 03/27/2022)   INFLUENZA VACCINE  02/25/2023   Medicare Annual Wellness (AWV)  11/12/2023   Pneumonia Vaccine 65+ Years old  Completed   DEXA SCAN  Completed   Zoster Vaccines- Shingrix  Completed   HPV VACCINES  Aged Out    Health Maintenance  Health Maintenance Due  Topic Date Due   DTaP/Tdap/Td (3 - Tdap) 12/29/2017    Colorectal  cancer screening: No longer required.   Mammogram status: No longer required due to Age.  Bone Density status: Completed 03/21/14. Results reflect: Bone density results: OSTEOPOROSIS. Repeat every   years.  Lung Cancer Screening: (Low Dose CT Chest recommended if Age 65-80 years, 30 pack-year currently smoking OR have quit w/in 15years.) does not qualify.     Additional Screening:  Hepatitis C Screening: does not qualify; Completed    Vision Screening: Recommended annual ophthalmology exams for early detection of glaucoma and other disorders of the eye. Is the patient up to date with their annual eye exam?  Yes  Who is the provider or what is the name of the office in which the patient attends annual eye exams? Dr Valere Dross If pt is not established with a provider, would they like to be referred to a provider to establish care? No .   Dental Screening: Recommended annual dental exams for proper oral hygiene  Community Resource Referral / Chronic Care Management:  CRR required this visit?  No   CCM required this visit?  No      Plan:     I have personally reviewed and noted the following in the patient's chart:   Medical and social history Use of alcohol, tobacco or illicit drugs  Current medications and supplements including opioid prescriptions. Patient is currently taking opioid prescriptions. Information provided to patient regarding non-opioid alternatives. Patient advised to discuss non-opioid treatment plan with their  provider. Functional ability and status Nutritional status Physical activity Advanced directives List of other physicians Hospitalizations, surgeries, and ER visits in previous 12 months Vitals Screenings to include cognitive, depression, and falls Referrals and appointments  In addition, I have reviewed and discussed with patient certain preventive protocols, quality metrics, and best practice recommendations. A written personalized care plan for preventive services as well as general preventive health recommendations were provided to patient.     Tillie Rung, LPN   1/61/0960   Nurse Notes: None     I have reviewed this visit and agree with the documentation.  Marshall L Chambliss

## 2022-11-12 NOTE — Patient Instructions (Addendum)
Jacqueline [name] , Thank you for taking time to come for your Medicare Wellness Visit. I appreciate your ongoing commitment to your health goals. Please review the following plan we discussed and let me know if I can assist you in the future.   These are the goals we discussed:  Goals       Blood Pressure < 150/90      I want to keep my blood pressure at a  normal level (pt-stated)      Patient Goals/Self Care Activities: -Patient/Caregiver will self-administer medications as prescribed as evidenced by self-report/primary caregiver report  -Patient/Caregiver will attend all scheduled provider appointments as evidenced by clinician review of documented attendance to scheduled appointments and patient/caregiver report -Patient/Caregiver will call pharmacy for medication refills as evidenced by patient report and review of pharmacy fill history as appropriate -Patient/Caregiver will call provider office for new concerns or questions as evidenced by review of documented incoming telephone call notes and patient report -Patient/Caregiver verbalizes understanding of plan -Patient/Caregiver will focus on medication adherence by taking medications as prescribed  -Calls provider office for new concerns, questions, or BP outside discussed parameters -Checks BP and records as discussed -Continue to walk with your walker        Maintain current level of physical activity (pt-stated)      Walking 20 minutes daily      Stay Healthy (pt-stated)        This is a list of the screening recommended for you and due dates:  Health Maintenance  Topic Date Due   DTaP/Tdap/Td vaccine (3 - Tdap) 12/29/2017   COVID-19 Vaccine (4 - 2023-24 season) 11/28/2022*   Flu Shot  02/25/2023   Medicare Annual Wellness Visit  11/12/2023   Pneumonia Vaccine  Completed   DEXA scan (bone density measurement)  Completed   Zoster (Shingles) Vaccine  Completed   HPV Vaccine  Aged Out  *Topic was postponed. The date shown is not  the original due date.   Opioid Pain Medicine Management Opioids are powerful medicines that are used to treat moderate to severe pain. When used for short periods of time, they can help you to: Sleep better. Do better in physical or occupational therapy. Feel better in the first few days after an injury. Recover from surgery. Opioids should be taken with the supervision of a trained health care provider. They should be taken for the shortest period of time possible. This is because opioids can be addictive, and the longer you take opioids, the greater your risk of addiction. This addiction can also be called opioid use disorder. What are the risks? Using opioid pain medicines for longer than 3 days increases your risk of side effects. Side effects include: Constipation. Nausea and vomiting. Breathing difficulties (respiratory depression). Drowsiness. Confusion. Opioid use disorder. Itching. Taking opioid pain medicine for a long period of time can affect your ability to do daily tasks. It also puts you at risk for: Motor vehicle crashes. Depression. Suicide. Heart attack. Overdose, which can be life-threatening. What is a pain treatment plan? A pain treatment plan is an agreement between you and your health care provider. Pain is unique to each person, and treatments vary depending on your condition. To manage your pain, you and your health care provider need to work together. To help you do this: Discuss the goals of your treatment, including how much pain you might expect to have and how you will manage the pain. Review the risks and benefits of taking opioid  medicines. Remember that a good treatment plan uses more than one approach and minimizes the chance of side effects. Be honest about the amount of medicines you take and about any drug or alcohol use. Get pain medicine prescriptions from only one health care provider. Pain can be managed with many types of alternative  treatments. Ask your health care provider to refer you to one or more specialists who can help you manage pain through: Physical or occupational therapy. Counseling (cognitive behavioral therapy). Good nutrition. Biofeedback. Massage. Meditation. Non-opioid medicine. Following a gentle exercise program. How to use opioid pain medicine Taking medicine Take your pain medicine exactly as told by your health care provider. Take it only when you need it. If your pain gets less severe, you may take less than your prescribed dose if your health care provider approves. If you are not having pain, do nottake pain medicine unless your health care provider tells you to take it. If your pain is severe, do nottry to treat it yourself by taking more pills than instructed on your prescription. Contact your health care provider for help. Write down the times when you take your pain medicine. It is easy to become confused while on pain medicine. Writing the time can help you avoid overdose. Take other over-the-counter or prescription medicines only as told by your health care provider. Keeping yourself and others safe  While you are taking opioid pain medicine: Do not drive, use machinery, or power tools. Do not sign legal documents. Do not drink alcohol. Do not take sleeping pills. Do not supervise children by yourself. Do not do activities that require climbing or being in high places. Do not go to a lake, river, ocean, spa, or swimming pool. Do not share your pain medicine with anyone. Keep pain medicine in a locked cabinet or in a secure area where pets and children cannot reach it. Stopping your use of opioids If you have been taking opioid medicine for more than a few weeks, you may need to slowly decrease (taper) how much you take until you stop completely. Tapering your use of opioids can decrease your risk of symptoms of withdrawal, such as: Pain and cramping in the  abdomen. Nausea. Sweating. Sleepiness. Restlessness. Uncontrollable shaking (tremors). Cravings for the medicine. Do not attempt to taper your use of opioids on your own. Talk with your health care provider about how to do this. Your health care provider may prescribe a step-down schedule based on how much medicine you are taking and how long you have been taking it. Getting rid of leftover pills Do not save any leftover pills. Get rid of leftover pills safely by: Taking the medicine to a prescription take-back program. This is usually offered by the county or law enforcement. Bringing them to a pharmacy that has a drug disposal container. Flushing them down the toilet. Check the label or package insert of your medicine to see whether this is safe to do. Throwing them out in the trash. Check the label or package insert of your medicine to see whether this is safe to do. If it is safe to throw it out, remove the medicine from the original container, put it into a sealable bag or container, and mix it with used coffee grounds, food scraps, dirt, or cat litter before putting it in the trash. Follow these instructions at home: Activity Do exercises as told by your health care provider. Avoid activities that make your pain worse. Return to your normal activities as  told by your health care provider. Ask your health care provider what activities are safe for you. General instructions You may need to take these actions to prevent or treat constipation: Drink enough fluid to keep your urine pale yellow. Take over-the-counter or prescription medicines. Eat foods that are high in fiber, such as beans, whole grains, and fresh fruits and vegetables. Limit foods that are high in fat and processed sugars, such as fried or sweet foods. Keep all follow-up visits. This is important. Where to find support If you have been taking opioids for a long time, you may benefit from receiving support for quitting  from a local support group or counselor. Ask your health care provider for a referral to these resources in your area. Where to find more information Centers for Disease Control and Prevention (CDC): FootballExhibition.com.br [place] Food and Drug Administration (FDA): PumpkinSearch.com.ee Get help right away if: You may have taken too much of an opioid (overdosed). Common symptoms of an overdose: Your breathing is slower or more shallow than normal. You have a very slow heartbeat (pulse). You have slurred speech. You have nausea and vomiting. Your pupils become very small. You have other potential symptoms: You are very confused. You faint or feel like you will faint. You have cold, clammy skin. You have blue lips or fingernails. You have thoughts of harming yourself or harming others. These symptoms may represent a serious problem that is an emergency. Do not wait to see if the symptoms will go away. Get medical help right away. Call your local emergency services (911 in the [place] Do not drive yourself to the hospital.  If you ever feel like you may hurt yourself or others, or have thoughts about taking your own life, get help right away. Go to your nearest emergency department or: Call your local emergency services (911 in the [place] Call the [facility] ((272)448-0947 in the [place] Call a suicide crisis helpline, such as the National Suicide Prevention Lifeline at (903)310-7083 or 988 in the [place] This is open 24 hours a day in the [place] Text the Crisis Text Line at 613-325-8658 (in the [place] Summary Opioid medicines can help you manage moderate to severe pain for a short period of time. A pain treatment plan is an agreement between you and your health care provider. Discuss the goals of your treatment, including how much pain you might expect to have and how you will manage the pain. If you think that you or someone else may have taken too much of an opioid, get medical help right away. This  information is not intended to replace advice given to you by your health care provider. Make sure you discuss any questions you have with your health care provider. Document Revised: 02/05/2021 Document Reviewed: 10/23/2020 Elsevier Patient Education  2023 [employer].  Advanced directives: Please bring a copy of your health care power of attorney and living will to the office to be added to your chart at your convenience.   Conditions/risks identified: None  Next appointment: Follow up in one year for your annual wellness visit     Preventive Care 65 Years and Older, Female Preventive care refers to lifestyle choices and visits with your health care provider that can promote health and wellness. What does preventive care include? A yearly physical exam. This is also called an annual well check. Dental exams once or twice a year. Routine eye exams. Ask your health care provider how often you should have your  eyes checked. Personal lifestyle choices, including: Daily care of your teeth and gums. Regular physical activity. Eating a healthy diet. Avoiding tobacco and drug use. Limiting alcohol use. Practicing safe sex. Taking low-dose aspirin every day. Taking vitamin and mineral supplements as recommended by your health care provider. What happens during an annual well check? The services and screenings done by your health care provider during your annual well check will depend on your age, overall health, lifestyle risk factors, and family history of disease. Counseling  Your health care provider may ask you questions about your: Alcohol use. Tobacco use. Drug use. Emotional well-being. Home and relationship well-being. Sexual activity. Eating habits. History of falls. Memory and ability to understand (cognition). Work and work Astronomer. Reproductive health. Screening  You may have the following tests or measurements: Height, weight, and BMI. Blood pressure. Lipid  and cholesterol levels. These may be checked every 5 years, or more frequently if you are over 34 years old. Skin check. Lung cancer screening. You may have this screening every year starting at age 5 if you have a 30-pack-year history of smoking and currently smoke or have quit within the past 15 years. Fecal occult blood test (FOBT) of the stool. You may have this test every year starting at age 57. Flexible sigmoidoscopy or colonoscopy. You may have a sigmoidoscopy every 5 years or a colonoscopy every 10 years starting at age 75. Hepatitis C blood test. Hepatitis B blood test. Sexually transmitted disease (STD) testing. Diabetes screening. This is done by checking your blood sugar (glucose) after you have not eaten for a while (fasting). You may have this done every 1-3 years. Bone density scan. This is done to screen for osteoporosis. You may have this done starting at age 73. Mammogram. This may be done every 1-2 years. Talk to your health care provider about how often you should have regular mammograms. Talk with your health care provider about your test results, treatment options, and if necessary, the need for more tests. Vaccines  Your health care provider may recommend certain vaccines, such as: Influenza vaccine. This is recommended every year. Tetanus, diphtheria, and acellular pertussis (Tdap, Td) vaccine. You may need a Td booster every 10 years. Zoster vaccine. You may need this after age 58. Pneumococcal 13-valent conjugate (PCV13) vaccine. One dose is recommended after age 26. Pneumococcal polysaccharide (PPSV23) vaccine. One dose is recommended after age 32. Talk to your health care provider about which screenings and vaccines you need and how often you need them. This information is not intended to replace advice given to you by your health care provider. Make sure you discuss any questions you have with your health care provider. Document Released: 08/09/2015 Document  Revised: 04/01/2016 Document Reviewed: 05/14/2015 Elsevier Interactive Patient Education  2017 [employer].  Fall Prevention in the Home Falls can cause injuries. They can happen to people of all ages. There are many things you can do to make your home safe and to help prevent falls. What can I do on the outside of my home? Regularly fix the edges of walkways and driveways and fix any cracks. Remove anything that might make you trip as you walk through a door, such as a raised step or threshold. Trim any bushes or trees on the path to your home. Use bright outdoor lighting. Clear any walking paths of anything that might make someone trip, such as rocks or tools. Regularly check to see if handrails are loose or broken. Make sure that both  sides of any steps have handrails. Any raised decks and porches should have guardrails on the edges. Have any leaves, snow, or ice cleared regularly. Use sand or salt on walking paths during winter. Clean up any spills in your garage right away. This includes oil or grease spills. What can I do in the bathroom? Use night lights. Install grab bars by the toilet and in the tub and shower. Do not use towel bars as grab bars. Use non-skid mats or decals in the tub or shower. If you need to sit down in the shower, use a plastic, non-slip stool. Keep the floor dry. Clean up any water that spills on the floor as soon as it happens. Remove soap buildup in the tub or shower regularly. Attach bath mats securely with double-sided non-slip rug tape. Do not have throw rugs and other things on the floor that can make you trip. What can I do in the bedroom? Use night lights. Make sure that you have a light by your bed that is easy to reach. Do not use any sheets or blankets that are too big for your bed. They should not hang down onto the floor. Have a firm chair that has side arms. You can use this for support while you get dressed. Do not have throw rugs and other  things on the floor that can make you trip. What can I do in the kitchen? Clean up any spills right away. Avoid walking on wet floors. Keep items that you use a lot in easy-to-reach places. If you need to reach something above you, use a strong step stool that has a grab bar. Keep electrical cords out of the way. Do not use floor polish or wax that makes floors slippery. If you must use wax, use non-skid floor wax. Do not have throw rugs and other things on the floor that can make you trip. What can I do with my stairs? Do not leave any items on the stairs. Make sure that there are handrails on both sides of the stairs and use them. Fix handrails that are broken or loose. Make sure that handrails are as long as the stairways. Check any carpeting to make sure that it is firmly attached to the stairs. Fix any carpet that is loose or worn. Avoid having throw rugs at the top or bottom of the stairs. If you do have throw rugs, attach them to the floor with carpet tape. Make sure that you have a light switch at the top of the stairs and the bottom of the stairs. If you do not have them, ask someone to add them for you. What else can I do to help prevent falls? Wear shoes that: Do not have high heels. Have rubber bottoms. Are comfortable and fit you well. Are closed at the toe. Do not wear sandals. If you use a stepladder: Make sure that it is fully opened. Do not climb a closed stepladder. Make sure that both sides of the stepladder are locked into place. Ask someone to hold it for you, if possible. Clearly mark and make sure that you can see: Any grab bars or handrails. First and last steps. Where the edge of each step is. Use tools that help you move around (mobility aids) if they are needed. These include: Canes. Walkers. Scooters. Crutches. Turn on the lights when you go into a dark area. Replace any light bulbs as soon as they burn out. Set up your furniture so you have  a clear  path. Avoid moving your furniture around. If any of your floors are uneven, fix them. If there are any pets around you, be aware of where they are. Review your medicines with your doctor. Some medicines can make you feel dizzy. This can increase your chance of falling. Ask your doctor what other things that you can do to help prevent falls. This information is not intended to replace advice given to you by your health care provider. Make sure you discuss any questions you have with your health care provider. Document Released: 05/09/2009 Document Revised: 12/19/2015 Document Reviewed: 08/17/2014 Elsevier Interactive Patient Education  2017 [employer].

## 2022-11-26 ENCOUNTER — Other Ambulatory Visit: Payer: Self-pay | Admitting: Cardiovascular Disease

## 2022-12-05 ENCOUNTER — Emergency Department (HOSPITAL_BASED_OUTPATIENT_CLINIC_OR_DEPARTMENT_OTHER)
Admission: EM | Admit: 2022-12-05 | Discharge: 2022-12-05 | Disposition: A | Discharge status: Home / Self Care | Payer: PPO | Attending: Emergency Medicine | Admitting: Emergency Medicine

## 2022-12-05 ENCOUNTER — Encounter (HOSPITAL_BASED_OUTPATIENT_CLINIC_OR_DEPARTMENT_OTHER): Payer: Self-pay | Admitting: Emergency Medicine

## 2022-12-05 ENCOUNTER — Emergency Department (HOSPITAL_BASED_OUTPATIENT_CLINIC_OR_DEPARTMENT_OTHER): Payer: PPO

## 2022-12-05 ENCOUNTER — Other Ambulatory Visit: Payer: Self-pay

## 2022-12-05 DIAGNOSIS — S0990XA Unspecified injury of head, initial encounter: Secondary | ICD-10-CM | POA: Diagnosis not present

## 2022-12-05 DIAGNOSIS — S0181XA Laceration without foreign body of other part of head, initial encounter: Secondary | ICD-10-CM | POA: Insufficient documentation

## 2022-12-05 DIAGNOSIS — S199XXA Unspecified injury of neck, initial encounter: Secondary | ICD-10-CM | POA: Diagnosis not present

## 2022-12-05 DIAGNOSIS — W01190A Fall on same level from slipping, tripping and stumbling with subsequent striking against furniture, initial encounter: Secondary | ICD-10-CM | POA: Insufficient documentation

## 2022-12-05 DIAGNOSIS — W19XXXA Unspecified fall, initial encounter: Secondary | ICD-10-CM

## 2022-12-05 DIAGNOSIS — Z7901 Long term (current) use of anticoagulants: Secondary | ICD-10-CM | POA: Diagnosis not present

## 2022-12-05 MED ORDER — LIDOCAINE HCL (PF) 1 % IJ SOLN
5.0000 mL | Freq: Once | INTRAMUSCULAR | Status: AC
  Administered 2022-12-05: 5 mL
  Filled 2022-12-05: qty 5

## 2022-12-05 NOTE — ED Triage Notes (Signed)
Pt tripped and hit her forehead on bottom of bed, is on eliquis, has a laceration to her left forehead.

## 2022-12-05 NOTE — ED Provider Notes (Signed)
[place] EMERGENCY DEPARTMENT AT [facility] Provider Note   CSN: [id number] Arrival date & time: 12/05/22  1040     History  Chief Complaint  Patient presents with   fall on blood thinners    Jacqueline [name] is a 89 y.o. female.  Patient is a 89 year old female with a past medical history of A-fib on Eliquis presenting to the emergency department after a fall.  The patient states while getting out of bed this morning she tripped and fell and hit her head on her bed frame.  She denies loss of consciousness.  She states that she cut her forehead and has pain around her cut but otherwise denies any other pain or injuries from the fall.  She denies any nausea, vomiting, numbness or weakness.  She denies any dizziness prior to the fall.  The history is provided by the patient and a relative.       Home Medications Prior to Admission medications   Medication Sig Start Date End Date Taking? Authorizing Provider  traMADol (ULTRAM) 50 MG tablet TAKE 1 TABLET BY MOUTH UP TO EVERY 8 HOURS AS NEEDED. 08/24/22   Moses Manners, MD  acetaminophen (TYLENOL) 325 MG tablet Take 2 tablets (650 mg total) by mouth every 6 (six) hours. 07/22/22   Shelby Mattocks, DO  ALPRAZolam Prudy Feeler) 0.5 MG tablet TAKE 1 TABLET BY MOUTH AT BEDTIME. 06/29/22   Hensel, Santiago Bumpers, MD  amiodarone (PACERONE) 200 MG tablet TAKE 1 TABLET BY MOUTH EVERY DAY Patient taking differently: Take 200 mg by mouth daily. 06/08/22   Billey Co, MD  amLODipine (NORVASC) 5 MG tablet Take 5 mg by mouth daily.    [provider]  apixaban (ELIQUIS) 2.5 MG TABS tablet Take 1 tablet (2.5 mg total) by mouth 2 (two) times daily. 10/13/22   Croitoru, Mihai, MD  cholecalciferol (VITAMIN D3) 25 MCG (1000 UNIT) tablet Take 1,000 Units by mouth daily.    [provider]  cycloSPORINE (RESTASIS) 0.05 % ophthalmic emulsion Place 1 drop into both eyes 2 (two) times daily.    [provider]  gabapentin  (NEURONTIN) 100 MG capsule TAKE 1 CAPSULE (100 MG TOTAL) BY MOUTH THREE TIMES DAILY. Patient taking differently: Take 100 mg by mouth 3 (three) times daily. 05/30/22   Billey Co, MD  levothyroxine (SYNTHROID) 75 MCG tablet TAKE 1 TABLET BY MOUTH EVERY DAY Patient taking differently: Take 75 mcg by mouth daily before breakfast. 03/31/22   Hensel, Santiago Bumpers, MD  Multiple Vitamin (MULTIVITAMIN WITH MINERALS) TABS tablet Take 1 tablet by mouth daily.    [provider]  nitroGLYCERIN (NITROSTAT) 0.4 MG SL tablet Place 1 tablet (0.4 mg total) under the tongue every 5 (five) minutes as needed for chest pain. 12/03/21   Croitoru, Mihai, MD  Omega-3 Fatty Acids (FISH OIL) 1000 MG CAPS Take 1,000 mg by mouth every evening.    [provider]  omeprazole (PRILOSEC) 20 MG capsule TAKE 1 CAPSULE BY MOUTH EVERY DAY Patient taking differently: Take 20 mg by mouth daily. 01/05/22   Moses Manners, MD  Polyethyl Glycol-Propyl Glycol (LUBRICANT EYE DROPS) 0.4-0.3 % SOLN Place 1 drop into both eyes 4 (four) times daily.    [provider]  polyethylene glycol (MIRALAX / GLYCOLAX) 17 g packet Take 17 g by mouth 2 (two) times daily. Patient taking differently: Take 17 g by mouth daily as needed for mild constipation. 02/13/22   Osvaldo Shipper, MD  pravastatin (PRAVACHOL)  40 MG tablet TAKE 1 TABLET BY MOUTH EVERY DAY IN THE EVENING 11/26/22   Croitoru, Mihai, MD  rOPINIRole (REQUIP) 0.25 MG tablet TAKE 1 TABLET BY MOUTH THREE TIMES A DAY Patient taking differently: Take 0.25 mg by mouth 3 (three) times daily. 06/25/22   Moses Manners, MD  senna-docusate (SENOKOT-S) 8.6-50 MG tablet Take 2 tablets by mouth 2 (two) times daily. Patient taking differently: Take 2 tablets by mouth at bedtime as needed for mild constipation. 02/13/22   Osvaldo Shipper, MD      Allergies    Atorvastatin, Beta adrenergic blockers, Crestor [rosuvastatin], Morphine and related, and Prednisone    Review of  Systems   Review of Systems  Physical Exam Updated Vital Signs BP (!) 163/63 (BP Location: Right Arm)   Pulse 65   Temp (!) 97.5 F (36.4 C) (Oral)   Resp 20   SpO2 97%  Physical Exam Vitals and nursing note reviewed.  Constitutional:      General: She is not in acute distress.    Appearance: Normal appearance.  HENT:     Head: Normocephalic.     Comments: Approximately 2 cm gaping, nonbleeding laceration to mid forehead    Nose: Nose normal.     Mouth/Throat:     Mouth: Mucous membranes are moist.     Pharynx: Oropharynx is clear.  Eyes:     Extraocular Movements: Extraocular movements intact.     Conjunctiva/sclera: Conjunctivae normal.  Neck:     Comments: No midline neck tenderness Cardiovascular:     Rate and Rhythm: Normal rate and regular rhythm.     Heart sounds: Normal heart sounds.  Pulmonary:     Effort: Pulmonary effort is normal.     Breath sounds: Normal breath sounds.  Abdominal:     General: Abdomen is flat.     Palpations: Abdomen is soft.     Tenderness: There is no abdominal tenderness.  Musculoskeletal:        General: Normal range of motion.     Cervical back: Normal range of motion and neck supple.     Comments: No midline back tenderness No bony tenderness of bilateral upper or lower extremities Pelvis stable, nontender  Skin:    General: Skin is warm and dry.  Neurological:     General: No focal deficit present.     Mental Status: She is alert and oriented to person, place, and time.     Sensory: No sensory deficit.     Motor: No weakness.     Gait: Gait normal.  Psychiatric:        Mood and Affect: Mood normal.        Behavior: Behavior normal.     ED Results / Procedures / Treatments   Labs (all labs ordered are listed, but only abnormal results are displayed) Labs Reviewed - No data to display  EKG None  Radiology CT Head Wo Contrast  Result Date: 12/05/2022 CLINICAL DATA:  Head trauma, minor (Age >= 65y); Neck trauma  (Age >= 65y). Fall, head injury, scalp laceration. EXAM: CT HEAD WITHOUT CONTRAST CT CERVICAL SPINE WITHOUT CONTRAST TECHNIQUE: Multidetector CT imaging of the head and cervical spine was performed following the standard protocol without intravenous contrast. Multiplanar CT image reconstructions of the cervical spine were also generated. RADIATION DOSE REDUCTION: This exam was performed according to the departmental dose-optimization program which includes automated exposure control, adjustment of the mA and/or kV according to patient size and/or use of iterative  reconstruction technique. COMPARISON:  02/06/2022 FINDINGS: CT HEAD FINDINGS Brain: Normal anatomic configuration. Parenchymal volume loss is commensurate with the patient's age. Stable moderate periventricular white matter changes are present likely reflecting the sequela of small vessel ischemia. No abnormal intra or extra-axial mass lesion or fluid collection. No abnormal mass effect or midline shift. No evidence of acute intracranial hemorrhage or infarct. Ventricular size is normal. Cerebellum unremarkable. Vascular: No asymmetric hyperdense vasculature at the skull base. Skull: Intact Sinuses/Orbits: Paranasal sinuses are clear. Orbits are unremarkable. Other: Mastoid air cells and middle ear cavities are clear. CT CERVICAL SPINE FINDINGS Alignment: Mild reversal of the normal cervical lordosis. There is 4 mm (grade 2) anterolisthesis C3-4, likely degenerative in nature. Skull base and vertebrae: Craniocervical alignment is normal. The atlantodental interval is not widened. No acute fracture of the cervical spine. Vertebral body height is preserved. Soft tissues and spinal canal: No prevertebral fluid or swelling. No visible canal hematoma. Extensive atherosclerotic calcification within the carotid bifurcations bilaterally. Disc levels: There is intervertebral disc space narrowing and endplate remodeling throughout the cervical spine, most severe at  C3-C6 in keeping with changes of advanced degenerative disc disease. Prevertebral soft tissues are not thickened on sagittal reformats. The spinal canal is widely patent. Uncovertebral arthrosis results in multilevel moderate to severe neuroforaminal narrowing, most severe on the right at C3-4. Upper chest: Mild biapical ground-glass pulmonary infiltrates are seen, not well characterized on this examination. 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 Other: None IMPRESSION: 1. No acute intracranial abnormality. No calvarial fracture. 2. No acute fracture or listhesis of the cervical spine. 3. Advanced multilevel degenerative disc and degenerative joint disease resulting in multilevel neuroforaminal narrowing, most severe on the right at C3-4. 4. Mild biapical ground-glass pulmonary infiltrates, not well characterized on this examination. 5. Extensive atherosclerotic calcification within the carotid bifurcations bilaterally. Electronically Signed   By: Helyn Numbers M.D.   On: 12/05/2022 11:27   CT Cervical Spine Wo Contrast  Result Date: 12/05/2022 CLINICAL DATA:  Head trauma, minor (Age >= 65y); Neck trauma (Age >= 65y). Fall, head injury, scalp laceration. EXAM: CT HEAD WITHOUT CONTRAST CT CERVICAL SPINE WITHOUT CONTRAST TECHNIQUE: Multidetector CT imaging of the head and cervical spine was performed following the standard protocol without intravenous contrast. Multiplanar CT image reconstructions of the cervical spine were also generated. RADIATION DOSE REDUCTION: This exam was performed according to the departmental dose-optimization program which includes automated exposure control, adjustment of the mA and/or kV according to patient size and/or use of iterative reconstruction technique. COMPARISON:  02/06/2022 FINDINGS: CT HEAD FINDINGS Brain: Normal anatomic configuration. Parenchymal volume loss is commensurate with the patient's age. Stable moderate periventricular white matter  changes are present likely reflecting the sequela of small vessel ischemia. No abnormal intra or extra-axial mass lesion or fluid collection. No abnormal mass effect or midline shift. No evidence of acute intracranial hemorrhage or infarct. Ventricular size is normal. Cerebellum unremarkable. Vascular: No asymmetric hyperdense vasculature at the skull base. Skull: Intact Sinuses/Orbits: Paranasal sinuses are clear. Orbits are unremarkable. Other: Mastoid air cells and middle ear cavities are clear. CT CERVICAL SPINE FINDINGS Alignment: Mild reversal of the normal cervical lordosis. There is 4 mm (grade 2) anterolisthesis C3-4, likely degenerative in nature. Skull base and vertebrae: Craniocervical alignment is normal. The atlantodental interval is not widened. No acute fracture of the cervical spine. Vertebral body height is preserved. Soft tissues and spinal canal: No prevertebral fluid or swelling. No visible canal hematoma. Extensive atherosclerotic calcification  within the carotid bifurcations bilaterally. Disc levels: There is intervertebral disc space narrowing and endplate remodeling throughout the cervical spine, most severe at C3-C6 in keeping with changes of advanced degenerative disc disease. Prevertebral soft tissues are not thickened on sagittal reformats. The spinal canal is widely patent. Uncovertebral arthrosis results in multilevel moderate to severe neuroforaminal narrowing, most severe on the right at C3-4. Upper chest: Mild biapical ground-glass pulmonary infiltrates are seen, not well characterized on this examination. 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 Other: None IMPRESSION: 1. No acute intracranial abnormality. No calvarial fracture. 2. No acute fracture or listhesis of the cervical spine. 3. Advanced multilevel degenerative disc and degenerative joint disease resulting in multilevel neuroforaminal narrowing, most severe on the right at C3-4. 4. Mild biapical  ground-glass pulmonary infiltrates, not well characterized on this examination. 5. Extensive atherosclerotic calcification within the carotid bifurcations bilaterally. Electronically Signed   By: Helyn Numbers M.D.   On: 12/05/2022 11:27    Procedures .Marland KitchenLaceration Repair  Date/Time: 12/05/2022 12:39 PM  Performed by: Rexford Maus, DO Authorized by: Rexford Maus, DO   Consent:    Consent obtained:  Verbal   Consent given by:  Patient   Risks, benefits, and alternatives were discussed: yes     Risks discussed:  Infection, need for additional repair, nerve damage, pain, poor cosmetic result, poor wound healing, retained foreign body, tendon damage and vascular damage   Alternatives discussed:  Delayed treatment and no treatment Universal protocol:    Patient identity confirmed:  Verbally with patient Anesthesia:    Anesthesia method:  Local infiltration   Local anesthetic:  Lidocaine 1% w/o epi Laceration details:    Location:  Face   Face location:  Forehead   Length (cm):  2   Depth (mm):  2 Pre-procedure details:    Preparation:  Imaging obtained to evaluate for foreign bodies Exploration:    Limited defect created (wound extended): yes     Hemostasis achieved with:  Direct pressure   Imaging obtained comment:  CT head   Imaging outcome: foreign body not noted     Wound exploration: wound explored through full range of motion and entire depth of wound visualized     Wound extent: areolar tissue not violated, fascia not violated, no foreign body, no signs of injury, no nerve damage, no tendon damage, no underlying fracture and no vascular damage     Contaminated: no   Treatment:    Area cleansed with:  Saline   Amount of cleaning:  Standard   Irrigation solution:  Sterile saline   Irrigation method:  Pressure wash   Visualized foreign bodies/material removed: no     Debridement:  None   Undermining:  None   Scar revision: no   Skin repair:    Repair method:   Sutures   Suture size:  6-0   Suture material:  Prolene   Suture technique:  Simple interrupted   Number of sutures:  5 Approximation:    Approximation:  Close Repair type:    Repair type:  Simple Post-procedure details:    Dressing:  Open (no dressing)   Procedure completion:  Tolerated well, no immediate complications     Medications Ordered in ED Medications  lidocaine (PF) (XYLOCAINE) 1 % injection 5 mL (has no administration in time range)    ED Course/ Medical Decision Making/ A&P Clinical Course as of 12/05/22 1242  Sat Dec 05, 2022  1130 No acute traumatic injury  on CT imaging [VK]    Clinical Course User Index [VK] Rexford Maus, DO                             Medical Decision Making This patient presents to the ED with chief complaint(s) of fall with pertinent past medical history of A-fib on Eliquis which further complicates the presenting complaint. The complaint involves an extensive differential diagnosis and also carries with it a high risk of complications and morbidity.    The differential diagnosis includes ICH, mass effect, cervical spine fracture, forehead laceration, no other traumatic injuries seen on exam, no presyncopal symptoms making syncopal fall unlikely  Additional history obtained: Additional history obtained from family Records reviewed N/A  ED Course and Reassessment: On patient's arrival to the emergency department she is hemodynamically stable in no acute distress.  She does have evidence of head trauma with a forehead laceration and is on thinners and will have a CT head and C-spine performed to evaluate for acute traumatic injury.  Her laceration will require repair.  She has no other traumatic injuries seen on exam.  Independent labs interpretation:  N/A  Independent visualization of imaging: - I independently visualized the following imaging with scope of interpretation limited to determining acute life threatening conditions  related to emergency care: CT head/C-spine, which revealed no acute traumatic injury  Consultation: - Consulted or discussed management/test interpretation w/ external professional: N/A  Consideration for admission or further workup: Patient has no emergent conditions requiring admission or further work-up at this time and is stable for discharge home with primary care follow-up  Social Determinants of health: N/A    Amount and/or Complexity of Data Reviewed Radiology: ordered.  Risk Prescription drug management.          Final Clinical Impression(s) / ED Diagnoses Final diagnoses:  Fall, initial encounter  Laceration of forehead, initial encounter    Rx / DC Orders ED Discharge Orders     None         Rexford Maus, DO 12/05/22 1242

## 2022-12-05 NOTE — Discharge Instructions (Signed)
You were seen in the emergency department after your fall.  He had no signs of broken bones or injury to your brain.  You did have a cut on your forehead and we repaired this with 5 stitches.  These will need to be removed in 3 to 5 days and can be removed at your primary doctor, urgent care or in the ER.  You can shower normally, just do not soak your face while the stitches are in place.  You should return to the emergency department sooner if you are having spreading redness from your wound, pus draining from your wound, fevers or any other new or concerning symptoms.

## 2022-12-09 ENCOUNTER — Other Ambulatory Visit: Payer: Self-pay

## 2022-12-09 ENCOUNTER — Emergency Department (HOSPITAL_BASED_OUTPATIENT_CLINIC_OR_DEPARTMENT_OTHER)
Admission: EM | Admit: 2022-12-09 | Discharge: 2022-12-09 | Disposition: A | Discharge status: Home / Self Care | Payer: PPO | Attending: Emergency Medicine | Admitting: Emergency Medicine

## 2022-12-09 ENCOUNTER — Encounter (HOSPITAL_BASED_OUTPATIENT_CLINIC_OR_DEPARTMENT_OTHER): Payer: Self-pay | Admitting: Emergency Medicine

## 2022-12-09 DIAGNOSIS — Z4802 Encounter for removal of sutures: Secondary | ICD-10-CM | POA: Insufficient documentation

## 2022-12-09 DIAGNOSIS — I4891 Unspecified atrial fibrillation: Secondary | ICD-10-CM | POA: Insufficient documentation

## 2022-12-09 DIAGNOSIS — S0181XD Laceration without foreign body of other part of head, subsequent encounter: Secondary | ICD-10-CM | POA: Diagnosis not present

## 2022-12-09 DIAGNOSIS — Z7901 Long term (current) use of anticoagulants: Secondary | ICD-10-CM | POA: Insufficient documentation

## 2022-12-09 DIAGNOSIS — I10 Essential (primary) hypertension: Secondary | ICD-10-CM | POA: Insufficient documentation

## 2022-12-09 NOTE — ED Provider Notes (Signed)
[place] EMERGENCY DEPARTMENT AT [facility] Provider Note   CSN: [id number] Arrival date & time: 12/09/22  1442     History  Chief Complaint  Patient presents with   Suture / Staple Removal    Jacqueline [name] is a 89 y.o. female.  Patient is a 89 year old female with a past medical history of hypertension tension and A-fib on Eliquis presenting to the emergency department for suture removal.  Patient was seen in the ED 4 days ago for a forehead laceration and had 5 stitches placed.  She states that these have been healing well with no drainage, surrounding erythema or warmth and no fevers.  The history is provided by the patient.  Suture / Staple Removal       Home Medications Prior to Admission medications   Medication Sig Start Date End Date Taking? Authorizing Provider  traMADol (ULTRAM) 50 MG tablet TAKE 1 TABLET BY MOUTH UP TO EVERY 8 HOURS AS NEEDED. 08/24/22   Moses Manners, MD  acetaminophen (TYLENOL) 325 MG tablet Take 2 tablets (650 mg total) by mouth every 6 (six) hours. 07/22/22   Shelby Mattocks, DO  ALPRAZolam Prudy Feeler) 0.5 MG tablet TAKE 1 TABLET BY MOUTH AT BEDTIME. 06/29/22   Hensel, Santiago Bumpers, MD  amiodarone (PACERONE) 200 MG tablet TAKE 1 TABLET BY MOUTH EVERY DAY Patient taking differently: Take 200 mg by mouth daily. 06/08/22   Billey Co, MD  amLODipine (NORVASC) 5 MG tablet Take 5 mg by mouth daily.    [provider]  apixaban (ELIQUIS) 2.5 MG TABS tablet Take 1 tablet (2.5 mg total) by mouth 2 (two) times daily. 10/13/22   Croitoru, Mihai, MD  cholecalciferol (VITAMIN D3) 25 MCG (1000 UNIT) tablet Take 1,000 Units by mouth daily.    [provider]  cycloSPORINE (RESTASIS) 0.05 % ophthalmic emulsion Place 1 drop into both eyes 2 (two) times daily.    [provider]  gabapentin (NEURONTIN) 100 MG capsule TAKE 1 CAPSULE (100 MG TOTAL) BY MOUTH THREE TIMES DAILY. Patient taking differently: Take 100 mg by mouth  3 (three) times daily. 05/30/22   Billey Co, MD  levothyroxine (SYNTHROID) 75 MCG tablet TAKE 1 TABLET BY MOUTH EVERY DAY Patient taking differently: Take 75 mcg by mouth daily before breakfast. 03/31/22   Hensel, Santiago Bumpers, MD  Multiple Vitamin (MULTIVITAMIN WITH MINERALS) TABS tablet Take 1 tablet by mouth daily.    [provider]  nitroGLYCERIN (NITROSTAT) 0.4 MG SL tablet Place 1 tablet (0.4 mg total) under the tongue every 5 (five) minutes as needed for chest pain. 12/03/21   Croitoru, Mihai, MD  Omega-3 Fatty Acids (FISH OIL) 1000 MG CAPS Take 1,000 mg by mouth every evening.    [provider]  omeprazole (PRILOSEC) 20 MG capsule TAKE 1 CAPSULE BY MOUTH EVERY DAY Patient taking differently: Take 20 mg by mouth daily. 01/05/22   Moses Manners, MD  Polyethyl Glycol-Propyl Glycol (LUBRICANT EYE DROPS) 0.4-0.3 % SOLN Place 1 drop into both eyes 4 (four) times daily.    [provider]  polyethylene glycol (MIRALAX / GLYCOLAX) 17 g packet Take 17 g by mouth 2 (two) times daily. Patient taking differently: Take 17 g by mouth daily as needed for mild constipation. 02/13/22   Osvaldo Shipper, MD  pravastatin (PRAVACHOL) 40 MG tablet TAKE 1 TABLET BY MOUTH EVERY DAY IN THE EVENING 11/26/22   Croitoru, Mihai, MD  rOPINIRole (REQUIP) 0.25 MG tablet TAKE 1 TABLET BY  MOUTH THREE TIMES A DAY Patient taking differently: Take 0.25 mg by mouth 3 (three) times daily. 06/25/22   Moses Manners, MD  senna-docusate (SENOKOT-S) 8.6-50 MG tablet Take 2 tablets by mouth 2 (two) times daily. Patient taking differently: Take 2 tablets by mouth at bedtime as needed for mild constipation. 02/13/22   Osvaldo Shipper, MD      Allergies    Atorvastatin, Beta adrenergic blockers, Crestor [rosuvastatin], Morphine and codeine, and Prednisone    Review of Systems   Review of Systems  Physical Exam Updated Vital Signs BP (!) 187/68 (BP Location: Right Arm)   Pulse 65   Temp 97.8 F  (36.6 C) (Temporal)   Resp 18   SpO2 97%  Physical Exam Vitals and nursing note reviewed.  Constitutional:      Appearance: Normal appearance.  HENT:     Head: Normocephalic.  Eyes:     Extraocular Movements: Extraocular movements intact.  Pulmonary:     Effort: Pulmonary effort is normal.  Musculoskeletal:        General: Normal range of motion.     Cervical back: Normal range of motion.  Skin:    General: Skin is warm and dry.     Comments: Approximately 2 cm sutured laceration to mid forehead with 5 sutures in place, no surrounding erythema, warmth or drainage, no wound dehiscence  Neurological:     General: No focal deficit present.     Mental Status: She is alert and oriented to person, place, and time.  Psychiatric:        Mood and Affect: Mood normal.        Behavior: Behavior normal.     ED Results / Procedures / Treatments   Labs (all labs ordered are listed, but only abnormal results are displayed) Labs Reviewed - No data to display  EKG None  Radiology No results found.  Procedures .Suture Removal  Date/Time: 12/09/2022 2:56 PM  Performed by: Rexford Maus, DO Authorized by: Rexford Maus, DO   Consent:    Consent obtained:  Verbal   Consent given by:  Patient   Risks, benefits, and alternatives were discussed: yes     Risks discussed:  Bleeding, pain and wound separation   Alternatives discussed:  No treatment and delayed treatment Universal protocol:    Patient identity confirmed:  Verbally with patient Location:    Location:  Head/neck   Head/neck location:  Forehead Procedure details:    Wound appearance:  No signs of infection, good wound healing and clean   Number of sutures removed:  5 Post-procedure details:    Post-removal:  No dressing applied   Procedure completion:  Tolerated well, no immediate complications     Medications Ordered in ED Medications - No data to display  ED Course/ Medical Decision Making/ A&P                              Medical Decision Making This patient presents to the ED with chief complaint(s) of suture removal with pertinent past medical history of a fib on Eliquis which further complicates the presenting complaint. The complaint involves an extensive differential diagnosis and also carries with it a high risk of complications and morbidity.    The differential diagnosis includes wound appears clean, dry and well healed with no evidence of cellulitis or wound dehiscence    Additional history obtained: Additional history obtained from family Records reviewed  prior ED records  ED Course and Reassessment: On patient's arrival she is well-appearing in no acute distress.  Her wound appears well-healing without any signs of dehiscence.  The patient had all 5 sutures removed without complication.  She stable for discharge home was given strict return precautions.  Independent labs interpretation:  N/A  Independent visualization of imaging: -N/A  Consultation: - Consulted or discussed management/test interpretation w/ external professional: N/A  Consideration for admission or further workup: Patient has no emergent conditions requiring admission or further work-up at this time and is stable for discharge home with primary care follow-up  Social Determinants of health: N/A            Final Clinical Impression(s) / ED Diagnoses Final diagnoses:  Encounter for removal of sutures    Rx / DC Orders ED Discharge Orders     None         Rexford Maus, DO 12/09/22 1502

## 2022-12-09 NOTE — ED Triage Notes (Signed)
Pt arrives to ED to have stitches in forehead removed.

## 2022-12-09 NOTE — ED Notes (Signed)
Patient verbalizes understanding of discharge instructions. Opportunity for questioning and answers were provided. Patient discharged from ED.  °

## 2022-12-09 NOTE — Discharge Instructions (Signed)
You were seen in the emergency department to have your stitches removed.  Your scar appeared to be well-healed and we are able to remove all 5 stitches.  He had no signs of infection.  You can apply an SPF sunscreen or lotion to help prevent scarring and can follow-up with your primary doctor as needed.  You can return to the emergency department if you are are having redness spreading from your wound, pus draining from your wound or any other new or concerning symptoms.

## 2022-12-16 ENCOUNTER — Other Ambulatory Visit: Payer: Self-pay

## 2022-12-16 DIAGNOSIS — I480 Paroxysmal atrial fibrillation: Secondary | ICD-10-CM | POA: Diagnosis not present

## 2022-12-16 DIAGNOSIS — Z6821 Body mass index (BMI) 21.0-21.9, adult: Secondary | ICD-10-CM | POA: Diagnosis not present

## 2022-12-16 DIAGNOSIS — K219 Gastro-esophageal reflux disease without esophagitis: Secondary | ICD-10-CM

## 2022-12-16 DIAGNOSIS — Z7901 Long term (current) use of anticoagulants: Secondary | ICD-10-CM | POA: Diagnosis not present

## 2022-12-16 DIAGNOSIS — Z515 Encounter for palliative care: Secondary | ICD-10-CM | POA: Diagnosis not present

## 2022-12-16 DIAGNOSIS — I5030 Unspecified diastolic (congestive) heart failure: Secondary | ICD-10-CM | POA: Diagnosis not present

## 2022-12-16 MED ORDER — OMEPRAZOLE 20 MG PO CPDR: 20.0000 mg | DELAYED_RELEASE_CAPSULE | Freq: Every day | ORAL | 1 refills | Status: DC

## 2022-12-16 MED ORDER — ROPINIROLE HCL 0.25 MG PO TABS: 0.2500 mg | ORAL_TABLET | Freq: Three times a day (TID) | ORAL | 1 refills | Status: DC

## 2022-12-17 ENCOUNTER — Ambulatory Visit: Payer: Self-pay

## 2022-12-17 NOTE — Patient Instructions (Signed)
Visit Information  Thank you for taking time to visit with me today. Please don't hesitate to contact me if I can be of assistance to you.   Following are the goals we discussed today:   Goals Addressed               This Visit's Progress     I want to keep my blood pressure at a  normal level (pt-stated)        Patient Goals/Self Care Activities: -Patient/Caregiver will self-administer medications as prescribed as evidenced by self-report/primary caregiver report  -Patient/Caregiver will attend all scheduled provider appointments as evidenced by clinician review of documented attendance to scheduled appointments and patient/caregiver report -Patient/Caregiver will call pharmacy for medication refills as evidenced by patient report and review of pharmacy fill history as appropriate -Patient/Caregiver will call provider office for new concerns or questions as evidenced by review of documented incoming telephone call notes and patient report -Patient/Caregiver verbalizes understanding of plan -Patient/Caregiver will focus on medication adherence by taking medications as prescribed  -Calls provider office for new concerns, questions, or BP outside discussed parameters -Checks BP and records as discussed -Continue to walk with your walker  -Use fall precautions that we discussed         Our next appointment is by telephone on 01/20/23 at 2 pm  Please call the care guide team at [phone] if you need to cancel or reschedule your appointment.   If you are experiencing a Mental Health or Behavioral Health Crisis or need someone to talk to, please call 1-800-273-TALK (toll free, 24 hour hotline)  Patient verbalizes understanding of instructions and care plan provided today and agrees to view in MyChart. Active MyChart status and patient understanding of how to access instructions and care plan via MyChart confirmed with patient.     Jacqueline Fairly RN, BSN, [facility] Care Coordinator [facility]   Phone: [phone]

## 2022-12-17 NOTE — Patient Outreach (Signed)
  Care Coordination   Follow Up Visit Note   12/17/2022 Name: Jacqueline [name] MRN: [id number] DOB: 1930-09-03  Jacqueline [name] is a 89 y.o. year old female who sees Pray, Milus Mallick, MD for primary care. I spoke with  [name] by phone today.  What matters to the patients health and wellness today?  Jacqueline [name] just woke up. She went out earlier and got groceries and put them up, and she took a little nap when she came back home. Her blood pressure is doing good. It was 125/70  this morning. No problem with headaches or chest pain. She is still walking with a walker and still has pain in her pelvis area. Jacqueline [name] had a fall two weeks ago, and when she went to pull her pajamas off, she hit her head on the corner of the nightstand and had to get five stitches put in. She has had the stitches removed.     Goals Addressed               This Visit's Progress     I want to keep my blood pressure at a  normal level (pt-stated)        Patient Goals/Self Care Activities: -Patient/Caregiver will self-administer medications as prescribed as evidenced by self-report/primary caregiver report  -Patient/Caregiver will attend all scheduled provider appointments as evidenced by clinician review of documented attendance to scheduled appointments and patient/caregiver report -Patient/Caregiver will call pharmacy for medication refills as evidenced by patient report and review of pharmacy fill history as appropriate -Patient/Caregiver will call provider office for new concerns or questions as evidenced by review of documented incoming telephone call notes and patient report -Patient/Caregiver verbalizes understanding of plan -Patient/Caregiver will focus on medication adherence by taking medications as prescribed  -Calls provider office for new concerns, questions, or BP outside discussed parameters -Checks BP and records as discussed -Continue to walk with your walker  -Use fall precautions that  we discussed         SDOH assessments and interventions completed:  No     Care Coordination Interventions:  Yes, provided   Interventions Today    Flowsheet Row Most Recent Value  Chronic Disease   Chronic disease during today's visit Hypertension (HTN)  General Interventions   General Interventions Discussed/Reviewed General Interventions Reviewed  Education Interventions   Education Provided Provided Education  Provided Verbal Education On Nutrition  Nutrition Interventions   Nutrition Discussed/Reviewed Nutrition Discussed  Pharmacy Interventions   Pharmacy Dicussed/Reviewed Pharmacy Topics Discussed  Safety Interventions   Safety Discussed/Reviewed Fall Risk, Safety Discussed, Home Safety  Home Safety Assistive Devices        Follow up plan: Follow up call scheduled for 01/20/23  2 pm    Encounter Outcome:  Pt. Visit Completed   Juanell Fairly RN, BSN, [facility] Care Coordinator [facility]   Phone: [phone]

## 2023-01-05 DIAGNOSIS — J069 Acute upper respiratory infection, unspecified: Secondary | ICD-10-CM | POA: Diagnosis not present

## 2023-01-07 DIAGNOSIS — J069 Acute upper respiratory infection, unspecified: Secondary | ICD-10-CM | POA: Diagnosis not present

## 2023-01-11 ENCOUNTER — Encounter: Payer: Self-pay | Admitting: Cardiovascular Disease

## 2023-01-11 ENCOUNTER — Ambulatory Visit: Payer: PPO | Attending: Cardiovascular Disease | Admitting: Cardiovascular Disease

## 2023-01-11 VITALS — BP 138/60 | HR 57 | Ht 60.0 in | Wt 114.2 lb

## 2023-01-11 DIAGNOSIS — I2721 Secondary pulmonary arterial hypertension: Secondary | ICD-10-CM

## 2023-01-11 DIAGNOSIS — E7800 Pure hypercholesterolemia, unspecified: Secondary | ICD-10-CM

## 2023-01-11 DIAGNOSIS — N1832 Hypertensive chronic kidney disease with stage 1 through stage 4 chronic kidney disease, or unspecified chronic kidney disease: Secondary | ICD-10-CM

## 2023-01-11 DIAGNOSIS — Z5181 Encounter for therapeutic drug level monitoring: Secondary | ICD-10-CM | POA: Diagnosis not present

## 2023-01-11 DIAGNOSIS — I951 Orthostatic hypotension: Secondary | ICD-10-CM | POA: Diagnosis not present

## 2023-01-11 DIAGNOSIS — D6869 Other thrombophilia: Secondary | ICD-10-CM

## 2023-01-11 DIAGNOSIS — Z79899 Other long term (current) drug therapy: Secondary | ICD-10-CM

## 2023-01-11 DIAGNOSIS — I480 Paroxysmal atrial fibrillation: Secondary | ICD-10-CM

## 2023-01-11 DIAGNOSIS — I129 Hypertensive chronic kidney disease with stage 1 through stage 4 chronic kidney disease, or unspecified chronic kidney disease: Secondary | ICD-10-CM | POA: Diagnosis not present

## 2023-01-11 DIAGNOSIS — I25728 Atherosclerosis of autologous artery coronary artery bypass graft(s) with other forms of angina pectoris: Secondary | ICD-10-CM

## 2023-01-11 DIAGNOSIS — I5032 Chronic diastolic (congestive) heart failure: Secondary | ICD-10-CM

## 2023-01-11 MED ORDER — APIXABAN 2.5 MG PO TABS: 2.5000 mg | ORAL_TABLET | Freq: Two times a day (BID) | ORAL | 0 refills | Status: DC

## 2023-01-11 NOTE — Patient Instructions (Signed)
Medication Instructions:  No changes *If you need a refill on your cardiac medications before your next appointment, please call your pharmacy*   Lab Work: CMP, TSH, Lipid panel- today If you have labs (blood work) drawn today and your tests are completely normal, you will receive your results only by: MyChart Message (if you have MyChart) OR A paper copy in the mail If you have any lab test that is abnormal or we need to change your treatment, we will call you to review the results.  Follow-Up: At [facility], you and your health needs are our priority.  As part of our continuing mission to provide you with exceptional heart care, we have created designated Provider Care Teams.  These Care Teams include your primary Cardiologist (physician) and Advanced Practice Providers (APPs -  Physician Assistants and Nurse Practitioners) who all work together to provide you with the care you need, when you need it.  We recommend signing up for the patient portal called "MyChart".  Sign up information is provided on this After Visit Summary.  MyChart is used to connect with patients for Virtual Visits (Telemedicine).  Patients are able to view lab/test results, encounter notes, upcoming appointments, etc.  Non-urgent messages can be sent to your provider as well.   To learn more about what you can do with MyChart, go to [email or web address].    Your next appointment:   6 month(s)  Provider:   Thurmon Fair, MD

## 2023-01-11 NOTE — Progress Notes (Signed)
Cardiology Office Note    Date:  01/18/2023   ID:  Jacqueline, [name] Jul 24, 1931, MRN [id number]  PCP:  Billey Co, MD  Cardiologist:  Iman Orourke Electrophysiologist:  None   Evaluation Performed:  Follow-Up Visit  Chief Complaint:  F/U AFib  History of Present Illness:    Jacqueline [name] is a 89 y.o. female with coronary artery disease and previous bypass surgery (2006), paroxysmal atrial fibrillation requiring previous cardioversion, diastolic heart failure, HTN.  Generally doing well from a cardiovascular point of view, but continues to be prone to falls.  She had a fall in December and suffered a fracture of the right hip treated with an intramedullary pin.  More recently she fell in early May and had to have stitches in her forehead.  Neither 1 of these events seem to have been associated with syncope or lightheadedness.  In May she simply toppled over when she was bending down to reach towards her feet.  In the past, attempts to aggressively lower her blood pressure have led to syncope and falls and at one point she was actually taking midodrine for hypotension.  She is currently doing well on lower dose amlodipine as antihypertensive monotherapy.  She has not had any symptomatic recurrences of atrial fibrillation on amiodarone.  She is on low-dose Eliquis adjusted for age and small body size.  She had quite a bit of bleeding from her scalp laceration in May, but has not had other serious bleeding problems.   She had a protracted episode of persistent atrial fibrillation in summer 2020 on a low-dose of amiodarone.  The dose of amiodarone was increased back to 200 mg daily but before she could undergo cardioversion she presented with rectal bleeding due to hemorrhoids and diverticulosis which required an interruption in her anticoagulants.  She eventually underwent TEE guided cardioversion on July 6.  She had transient atrial fibrillation during urinary tract infection in  October 2021.    Past Medical History:  Diagnosis Date   Arthritis    back   Atrial fibrillation with rapid ventricular response (HCC) 02/2014   a. CHA2DS2VASc = 6 -> on eliquis;  b. 02/2014 s/p DCCV;  c. 08/2014 Echo: EF 55-60%, Gr 2 DD, mild MR, triv AI.   CAD (coronary artery disease)    a. s/p CABG x 2 (LIMA->LAD, VG->Diag);  b. 08/2014 Cath: LM nl, LAD 90p, LCX nl, RCA nl/dominant, LIMA->LAD atretic, VG->D2 patent w/ retrograde filling of LAD, EF 55-60%-->Med Rx.   Diverticulitis 02/21/2012   GERD (gastroesophageal reflux disease)    Hyperlipemia    Hypertension    Insomnia    Past Surgical History:  Procedure Laterality Date   ABDOMINAL HYSTERECTOMY  1975   CARDIAC CATHETERIZATION N/A 05/09/2015   Procedure: Right Heart Cath;  Surgeon: Laurey Morale, MD;  Location: [facility] INVASIVE CV LAB;  Service: Cardiovascular;  Laterality: N/A;   CARDIOVERSION N/A 02/28/2014   Procedure: CARDIOVERSION;  Surgeon: Thurmon Fair, MD;  Location: [facility] ENDOSCOPY;  Service: Cardiovascular;  Laterality: N/A;   CARDIOVERSION N/A 01/30/2019   Procedure: CARDIOVERSION;  Surgeon: Pricilla Riffle, MD;  Location: [facility] ENDOSCOPY;  Service: Cardiovascular;  Laterality: N/A;   CATARACT EXTRACTION Bilateral 2014   with lens implanted   COLONOSCOPY N/A 02/19/2015   Procedure: COLONOSCOPY;  Surgeon: Meryl Dare, MD;  Location: [facility] ENDOSCOPY;  Service: Endoscopy;  Laterality: N/A;   CORONARY ARTERY BYPASS GRAFT  2006   off-pump bypass surgery with LIMA to the LAD and  SVG to the second diagonal artery ( performed by Dr Tyrone Sage)    FEMUR IM NAIL Right 07/18/2022   Procedure: INTRAMEDULLARY (IM) NAIL, HIP;  Surgeon: Sheral Apley, MD;  Location: [facility];  Service: Orthopedics;  Laterality: Right;   JOINT REPLACEMENT Bilateral    L-2004, R-2006   LEFT HEART CATHETERIZATION WITH CORONARY/GRAFT ANGIOGRAM N/A 09/17/2014   Procedure: LEFT HEART CATHETERIZATION WITH Isabel Caprice;  Surgeon: Lennette Bihari, MD;   Location: [facility] CATH LAB;  Service: Cardiovascular;  Laterality: N/A;   REIMPLANTATION OF TOTAL KNEE Right 10/08/2014   Procedure: REIMPLANTATION OF RIGHT TOTAL KNEE ARTHROPLASTY WITH REMOVAL OF ANTIBIOTIC SPACER;  Surgeon: Durene Romans, MD;  Location: [facility] ORS;  Service: Orthopedics;  Laterality: Right;   ROTATOR CUFF REPAIR Bilateral    r-1999, l- 2005   TEE WITHOUT CARDIOVERSION N/A 02/28/2014   Procedure: TRANSESOPHAGEAL ECHOCARDIOGRAM (TEE);  Surgeon: Thurmon Fair, MD;  Location: [facility] ENDOSCOPY;  Service: Cardiovascular;  Laterality: N/A;   TEE WITHOUT CARDIOVERSION N/A 01/30/2019   Procedure: TRANSESOPHAGEAL ECHOCARDIOGRAM (TEE);  Surgeon: Pricilla Riffle, MD;  Location: [facility] ENDOSCOPY;  Service: Cardiovascular;  Laterality: N/A;   TOTAL KNEE ARTHROPLASTY Right 07/02/2014   Procedure: Resection of Infected Right Total Knee Arthroplasty with placement antibiotic spacer;  Surgeon: Shelda Pal, MD;  Location: [facility] ORS;  Service: Orthopedics;  Laterality: Right;     Current Meds  Medication Sig   acetaminophen (TYLENOL) 325 MG tablet Take 2 tablets (650 mg total) by mouth every 6 (six) hours.   ALPRAZolam (XANAX) 0.5 MG tablet TAKE 1 TABLET BY MOUTH AT BEDTIME.   amiodarone (PACERONE) 200 MG tablet TAKE 1 TABLET BY MOUTH EVERY DAY (Patient taking differently: Take 200 mg by mouth daily.)   amLODipine (NORVASC) 5 MG tablet Take 5 mg by mouth daily.   apixaban (ELIQUIS) 2.5 MG TABS tablet Take 1 tablet (2.5 mg total) by mouth 2 (two) times daily.   apixaban (ELIQUIS) 2.5 MG TABS tablet Take 1 tablet (2.5 mg total) by mouth 2 (two) times daily.   cholecalciferol (VITAMIN D3) 25 MCG (1000 UNIT) tablet Take 1,000 Units by mouth daily.   cycloSPORINE (RESTASIS) 0.05 % ophthalmic emulsion Place 1 drop into both eyes 2 (two) times daily.   gabapentin (NEURONTIN) 100 MG capsule TAKE 1 CAPSULE (100 MG TOTAL) BY MOUTH THREE TIMES DAILY. (Patient taking differently: Take 100 mg by mouth 3 (three) times daily.)    levothyroxine (SYNTHROID) 75 MCG tablet TAKE 1 TABLET BY MOUTH EVERY DAY (Patient taking differently: Take 75 mcg by mouth daily before breakfast.)   Multiple Vitamin (MULTIVITAMIN WITH MINERALS) TABS tablet Take 1 tablet by mouth daily.   nitroGLYCERIN (NITROSTAT) 0.4 MG SL tablet Place 1 tablet (0.4 mg total) under the tongue every 5 (five) minutes as needed for chest pain.   Omega-3 Fatty Acids (FISH OIL) 1000 MG CAPS Take 1,000 mg by mouth every evening.   omeprazole (PRILOSEC) 20 MG capsule Take 1 capsule (20 mg total) by mouth daily.   Polyethyl Glycol-Propyl Glycol (LUBRICANT EYE DROPS) 0.4-0.3 % SOLN Place 1 drop into both eyes 4 (four) times daily.   polyethylene glycol (MIRALAX / GLYCOLAX) 17 g packet Take 17 g by mouth 2 (two) times daily. (Patient taking differently: Take 17 g by mouth daily as needed for mild constipation.)   pravastatin (PRAVACHOL) 40 MG tablet TAKE 1 TABLET BY MOUTH EVERY DAY IN THE EVENING   rOPINIRole (REQUIP) 0.25 MG tablet Take 1 tablet (0.25 mg total) by mouth  3 (three) times daily.   senna-docusate (SENOKOT-S) 8.6-50 MG tablet Take 2 tablets by mouth 2 (two) times daily. (Patient taking differently: Take 2 tablets by mouth at bedtime as needed for mild constipation.)     Allergies:   Atorvastatin, Beta adrenergic blockers, Crestor [rosuvastatin], Morphine and codeine, and Prednisone   Social History   Tobacco Use   Smoking status: Never   Smokeless tobacco: Never  Vaping Use   Vaping Use: Never used  Substance Use Topics   Alcohol use: No   Drug use: No     Family Hx: The patient's family history includes Arthritis in her brother; Cancer in her brother; Heart attack in her brother; Parkinson's disease in her mother. There is no history of Breast cancer.  ROS:   Please see the history of present illness.    All other systems are reviewed and are negative.  Prior CV studies:   The following studies were reviewed today:  Notes from visit with Dr.  Leveda Anna on 06/10/2020  Echocardiogram 04/23/2020   1. Left ventricular ejection fraction, by estimation, is 55 to 60%. The left ventricle has normal function. The left ventricle has no regional wall motion abnormalities. Left ventricular diastolic parameters are consistent with Grade III diastolic dysfunction (restrictive). Elevated left atrial pressure.   2. Right ventricular systolic function is normal. The right ventricular size is normal. There is mildly elevated pulmonary artery systolic pressure.   3. Left atrial size was moderately dilated.   4. The mitral valve is normal in structure. Mild mitral valve regurgitation. No evidence of mitral stenosis.   5. The aortic valve is tricuspid. Aortic valve regurgitation is trivial. Mild aortic valve sclerosis is present, with no evidence of aortic valve stenosis.   6. The inferior vena cava is normal in size with greater than 50% respiratory variability, suggesting right atrial pressure of 3 mmHg.  Addendum: On my review the diastolic function does not really qualify for restrictive filling, but rather for pseudonormal filling. [facility])     Labs/Other Tests and Data Reviewed:    EKG: Ordered today and personally reviewed, shows sinus bradycardia 57 bpm, QTc 441 ms Recent Labs: 05/07/2022: BNP 192.3; TSH 0.827 08/31/2022: ALT 14; BUN 17; Creatinine, Ser 1.36; Hemoglobin 12.5; Platelets 229; Potassium 4.3; Sodium 139   Recent Lipid Panel Lab Results  Component Value Date/Time   CHOL 147 10/20/2021 11:07 AM   TRIG 139 10/20/2021 11:07 AM   HDL 62 10/20/2021 11:07 AM   CHOLHDL 2.4 10/20/2021 11:07 AM   CHOLHDL 2.8 04/08/2016 11:53 AM   LDLCALC 61 10/20/2021 11:07 AM    Wt Readings from Last 3 Encounters:  01/11/23 114 lb 3.2 oz (51.8 kg)  11/12/22 115 lb (52.2 kg)  08/31/22 109 lb 9.6 oz (49.7 kg)     Objective:    Vital Signs:  BP 138/60   Pulse (!) 57   Ht 5' (1.524 m)   Wt 114 lb 3.2 oz (51.8 kg)   SpO2 94%   BMI 22.30 kg/m       General: Alert, oriented x3, no distress, appears elderly and frail. Head: no evidence of trauma, PERRL, EOMI, no exophtalmos or lid lag, no myxedema, no xanthelasma; normal ears, nose and oropharynx Neck: normal jugular venous pulsations and no hepatojugular reflux; brisk carotid pulses without delay and no carotid bruits Chest: clear to auscultation, no signs of consolidation by percussion or palpation, normal fremitus, symmetrical and full respiratory excursions Cardiovascular: normal position and quality of the apical  impulse, regular rhythm, normal first and second heart sounds, no murmurs, rubs or gallops Abdomen: no tenderness or distention, no masses by palpation, no abnormal pulsatility or arterial bruits, normal bowel sounds, no hepatosplenomegaly Extremities: no clubbing, cyanosis or edema; 2+ radial, ulnar and brachial pulses bilaterally; 2+ right femoral, posterior tibial and dorsalis pedis pulses; 2+ left femoral, posterior tibial and dorsalis pedis pulses; no subclavian or femoral bruits Neurological: grossly nonfocal Psych: Normal mood and affect     ASSESSMENT & PLAN:    1. Paroxysmal atrial fibrillation (HCC)   2. Encounter for monitoring amiodarone therapy   3. Acquired thrombophilia (HCC)   4. Coronary artery disease of autologous bypass graft with stable angina pectoris (HCC)   5. Chronic diastolic CHF (congestive heart failure) (HCC)   6. PAH (pulmonary artery hypertension) (HCC)   7. Hypertensive kidney disease with stage 3b chronic kidney disease (HCC)   8. Hypercholesterolemia   9. Orthostatic hypotension       AFib/flutter: No clinically evident recurrences ever since we increased the amiodarone to 200 mg daily.  She had a lengthy symptomatic episode in 2020 coinciding with GI bleeding, recurrent symptomatic episode of urinary tract infection in 2021. CHADSVasc 6 (age 76, gender, CAD, CHF, HTN).  On anticoagulation. Amiodarone: Had recurrent atrial  fibrillation on 100 mg daily but had symptomatic bradycardia on the 400 mg daily dose.  Seems to be doing reasonably well with only mild sinus bradycardia on amiodarone 200 mg daily.  Had normal liver function tests in February 2024 but has not had a TSH since last October.  Reminded her we need to check liver and thyroid function test every 6 months. Eliquis: Dose adjusted for age and renal dysfunction.  No recent bleeding issues.  Although I am concerned about her injuries and falls, at this point the benefit of the medication continues to outweigh its risk in my opinion. CAD: She has not had angina in a long time. Only on amlodipine, remains asymptomatic.  Risk factors are well addressed. CHF: Clinically euvolemic on exam.  Medical therapy limited by her risk of hypotension.  Avoiding SGLT2 inhibitors due to frequent urinary tract infections. PAH: Currently asymptomatic.  Most likely secondary to left heart diastolic failure.  CKD: Most recent creatinine 1.36 (range in the last several months has been 1.1-1.5). HLP: On pravastatin rather than simvastatin due to risk of interaction with amiodarone.  Most recent LDL cholesterol was excellent at 61, also very good HDL at 62. Orthostatic hypotension/HTN: We had been treating her with hydralazine for its short duration of action, but she is now back on amlodipine. UTI: Frequent recurrence, currently asymptomatic.  Long QT interval: prolonged in the past probably due to concomitant treatment with azithromycin.  Macrolides and other medications that can prolong the QT interval should be avoided if possible.    Patient Instructions  Medication Instructions:  No changes *If you need a refill on your cardiac medications before your next appointment, please call your pharmacy*   Lab Work: CMP, TSH, Lipid panel- today If you have labs (blood work) drawn today and your tests are completely normal, you will receive your results only by: MyChart Message  (if you have MyChart) OR A paper copy in the mail If you have any lab test that is abnormal or we need to change your treatment, we will call you to review the results.  Follow-Up: At [facility], you and your health needs are our priority.  As part of our continuing mission  to provide you with exceptional heart care, we have created designated Provider Care Teams.  These Care Teams include your primary Cardiologist (physician) and Advanced Practice Providers (APPs -  Physician Assistants and Nurse Practitioners) who all work together to provide you with the care you need, when you need it.  We recommend signing up for the patient portal called "MyChart".  Sign up information is provided on this After Visit Summary.  MyChart is used to connect with patients for Virtual Visits (Telemedicine).  Patients are able to view lab/test results, encounter notes, upcoming appointments, etc.  Non-urgent messages can be sent to your provider as well.   To learn more about what you can do with MyChart, go to [email or web address].    Your next appointment:   6 month(s)  Provider:   Thurmon Fair, MD       Signed, Thurmon Fair, MD  01/18/2023 3:11 PM    [facility]

## 2023-01-12 DIAGNOSIS — Z09 Encounter for follow-up examination after completed treatment for conditions other than malignant neoplasm: Secondary | ICD-10-CM | POA: Diagnosis not present

## 2023-01-12 DIAGNOSIS — Z8709 Personal history of other diseases of the respiratory system: Secondary | ICD-10-CM | POA: Diagnosis not present

## 2023-01-18 DIAGNOSIS — E7800 Pure hypercholesterolemia, unspecified: Secondary | ICD-10-CM | POA: Diagnosis not present

## 2023-01-18 DIAGNOSIS — Z5181 Encounter for therapeutic drug level monitoring: Secondary | ICD-10-CM | POA: Diagnosis not present

## 2023-01-18 DIAGNOSIS — Z79899 Other long term (current) drug therapy: Secondary | ICD-10-CM | POA: Diagnosis not present

## 2023-01-19 LAB — TSH: TSH: 3.23 u[IU]/mL (ref 0.450–4.500)

## 2023-01-19 LAB — COMPREHENSIVE METABOLIC PANEL
ALT: 17 IU/L (ref 0–32)
AST: 28 IU/L (ref 0–40)
Albumin: 4.4 g/dL (ref 3.6–4.6)
Alkaline Phosphatase: 104 IU/L (ref 44–121)
BUN/Creatinine Ratio: 14 (ref 12–28)
BUN: 18 mg/dL (ref 10–36)
Bilirubin Total: 0.3 mg/dL (ref 0.0–1.2)
CO2: 24 mmol/L (ref 20–29)
Calcium: 9 mg/dL (ref 8.7–10.3)
Chloride: 104 mmol/L (ref 96–106)
Creatinine, Ser: 1.25 mg/dL — ABNORMAL HIGH (ref 0.57–1.00)
Globulin, Total: 2.3 g/dL (ref 1.5–4.5)
Glucose: 82 mg/dL (ref 70–99)
Potassium: 4.9 mmol/L (ref 3.5–5.2)
Sodium: 141 mmol/L (ref 134–144)
Total Protein: 6.7 g/dL (ref 6.0–8.5)
eGFR: 41 mL/min/{1.73_m2} — ABNORMAL LOW (ref >59)

## 2023-01-19 LAB — LIPID PANEL
Chol/HDL Ratio: 3.1 ratio (ref 0.0–4.4)
Cholesterol, Total: 153 mg/dL (ref 100–199)
HDL: 50 mg/dL (ref >39)
LDL Chol Calc (NIH): 78 mg/dL (ref 0–99)
Triglycerides: 147 mg/dL (ref 0–149)
VLDL Cholesterol Cal: 25 mg/dL (ref 5–40)

## 2023-01-20 ENCOUNTER — Ambulatory Visit: Payer: Self-pay

## 2023-01-20 NOTE — Patient Instructions (Signed)
Visit Information  Thank you for taking time to visit with me today. Please don't hesitate to contact me if I can be of assistance to you.   Following are the goals we discussed today:   Goals Addressed               This Visit's Progress     I want to keep my blood pressure at a  normal level (pt-stated)        Patient Goals/Self Care Activities: -Patient/Caregiver will self-administer medications as prescribed as evidenced by self-report/primary caregiver report  -Patient/Caregiver will attend all scheduled provider appointments as evidenced by clinician review of documented attendance to scheduled appointments and patient/caregiver report -Patient/Caregiver will call pharmacy for medication refills as evidenced by patient report and review of pharmacy fill history as appropriate -Patient/Caregiver will call provider office for new concerns or questions as evidenced by review of documented incoming telephone call notes and patient report -Patient/Caregiver verbalizes understanding of plan -Patient/Caregiver will focus on medication adherence by taking medications as prescribed  -Calls provider office for new concerns, questions, or BP outside discussed parameters -Checks BP and records as discussed -Continue to walk with your walker  -Use fall precautions that we discussed         Our next appointment is by telephone on 02/19/23 at 1130 am  Please call the care guide team at [phone] if you need to cancel or reschedule your appointment.   If you are experiencing a Mental Health or Behavioral Health Crisis or need someone to talk to, please call 1-800-273-TALK (toll free, 24 hour hotline)  Patient verbalizes understanding of instructions and care plan provided today and agrees to view in MyChart. Active MyChart status and patient understanding of how to access instructions and care plan via MyChart confirmed with patient.     Juanell Fairly RN, BSN, [facility] Care  Coordinator [facility]   Phone: [phone]

## 2023-01-20 NOTE — Patient Outreach (Signed)
  Care Coordination   Follow Up Visit Note   01/20/2023 Name: Jacqueline [name] MRN: [id number] DOB: [birth year]  Jacqueline [name] is a 89 y.o. year old female who sees Jacqueline [name], Jacqueline Mallick, MD for primary care. I spoke with  Jacqueline [name] by phone today.  What matters to the patients health and wellness today?  Jacqueline [name] is doing remarkably well. Despite a challenging two-week battle with a cold, she overcame it with only a lingering cough. She has not experienced any falls and continues to walk with a walker. Her blood pressure has been stable, and she is sleeping and eating better. She has a PCP appointment scheduled for July 19th.     Goals Addressed               This Visit's Progress     I want to keep my blood pressure at a  normal level (pt-stated)        Patient Goals/Self Care Activities: -Patient/Caregiver will self-administer medications as prescribed as evidenced by self-report/primary caregiver report  -Patient/Caregiver will attend all scheduled provider appointments as evidenced by clinician review of documented attendance to scheduled appointments and patient/caregiver report -Patient/Caregiver will call pharmacy for medication refills as evidenced by patient report and review of pharmacy fill history as appropriate -Patient/Caregiver will call provider office for new concerns or questions as evidenced by review of documented incoming telephone call notes and patient report -Patient/Caregiver verbalizes understanding of plan -Patient/Caregiver will focus on medication adherence by taking medications as prescribed  -Calls provider office for new concerns, questions, or BP outside discussed parameters -Checks BP and records as discussed -Continue to walk with your walker  -Use fall precautions that we discussed         SDOH assessments and interventions completed:  No     Care Coordination Interventions:  Yes, provided   Interventions Today    Flowsheet Row  Most Recent Value  Chronic Disease   Chronic disease during today's visit Hypertension (HTN)  General Interventions   General Interventions Discussed/Reviewed General Interventions Reviewed  Nutrition Interventions   Nutrition Discussed/Reviewed Nutrition Discussed  Pharmacy Interventions   Pharmacy Dicussed/Reviewed Pharmacy Topics Discussed  Safety Interventions   Safety Discussed/Reviewed Safety Discussed        Follow up plan: Follow up call scheduled for 02/19/23  1130 am    Encounter Outcome:  Pt. Visit Completed   Jacqueline Fairly RN, BSN, [facility] Care Coordinator [facility]   Phone: [phone]

## 2023-02-05 DIAGNOSIS — D692 Other nonthrombocytopenic purpura: Secondary | ICD-10-CM | POA: Diagnosis not present

## 2023-02-05 DIAGNOSIS — Z515 Encounter for palliative care: Secondary | ICD-10-CM | POA: Diagnosis not present

## 2023-02-05 DIAGNOSIS — E039 Hypothyroidism, unspecified: Secondary | ICD-10-CM | POA: Diagnosis not present

## 2023-02-05 DIAGNOSIS — I4891 Unspecified atrial fibrillation: Secondary | ICD-10-CM | POA: Diagnosis not present

## 2023-02-05 DIAGNOSIS — Z79890 Hormone replacement therapy (postmenopausal): Secondary | ICD-10-CM | POA: Diagnosis not present

## 2023-02-05 DIAGNOSIS — Z7901 Long term (current) use of anticoagulants: Secondary | ICD-10-CM | POA: Diagnosis not present

## 2023-02-08 ENCOUNTER — Encounter: Payer: Self-pay | Admitting: Family Medicine

## 2023-02-08 ENCOUNTER — Other Ambulatory Visit: Payer: Self-pay

## 2023-02-08 DIAGNOSIS — R9431 Abnormal electrocardiogram [ECG] [EKG]: Secondary | ICD-10-CM | POA: Insufficient documentation

## 2023-02-08 DIAGNOSIS — F4329 Adjustment disorder with other symptoms: Secondary | ICD-10-CM

## 2023-02-08 MED ORDER — ALPRAZOLAM 0.5 MG PO TABS
0.5000 mg | ORAL_TABLET | Freq: Every evening | ORAL | 0 refills | Status: DC | PRN
Start: 2023-02-08 — End: 2023-05-10

## 2023-02-08 NOTE — Progress Notes (Unsigned)
   SUBJECTIVE:   CHIEF COMPLAINT / HPI:   Hypertension, PAF, tachy-brady syndrome, PAH, HFpEF, CAD, long QT interval: - follows with Cardiology  - Medications: amiodarone, eliquis, norvasc, NTG prn, pravastatin, fish oil - Compliance: good - Checking BP at home: yes, 130 SBP this morning.  Hypothyroidism - Medications: Synthroid - good compliance, doing well  GERD - prilosec. No complaints.  Constipation - miralax, senokot. Taking prn, unsure how often as grandson handles medication. No current trouble with stools.   OBJECTIVE:   BP (!) 141/70   Pulse (!) 59   Ht 5' (1.524 m)   Wt 112 lb 6.4 oz (51 kg)   SpO2 99%   BMI 21.95 kg/m   Gen: well appearing, in NAD Card: RRR Lungs: CTAB Ext: WWP, no edema   ASSESSMENT/PLAN:   Paroxysmal atrial fibrillation (HCC) RRR today. Reviewed recent labs. Continue to follow with cardiology.  Hypertension Initially elevated but improved on recheck and at goal. With wide pulse pressure.   CAD (coronary artery disease) Asymptomatic. On appropriate therapy. Continue to follow with Cardiology.  GERD (gastroesophageal reflux disease) Doing well on current regimen, no changes made today. Consider weaning to H2 blocker if continues to do well on current therapy.  Hypothyroidism Reviewed recent labs, no changes.   Hypertensive kidney disease with CKD stage III (HCC) Not on ARB due to hypotension, wide pulse pressure. Continue to monitor.   Frequent falls No falls in last few months. Does not have stairs in home. Continue to ambulate with walker.  Dyslipidemia Reviewed recent labs. Continue statin.   Health Maintenance - instructed to receive Tdap at pharmacy.  F/u 6 mths.  Caro Laroche, DO

## 2023-02-09 ENCOUNTER — Encounter: Payer: Self-pay | Admitting: Family Medicine

## 2023-02-09 ENCOUNTER — Ambulatory Visit: Payer: PPO | Admitting: Family Medicine

## 2023-02-09 VITALS — BP 141/70 | HR 59 | Ht 60.0 in | Wt 112.4 lb

## 2023-02-09 DIAGNOSIS — E785 Hyperlipidemia, unspecified: Secondary | ICD-10-CM | POA: Diagnosis not present

## 2023-02-09 DIAGNOSIS — Z951 Presence of aortocoronary bypass graft: Secondary | ICD-10-CM

## 2023-02-09 DIAGNOSIS — N1831 Chronic kidney disease, stage 3a: Secondary | ICD-10-CM

## 2023-02-09 DIAGNOSIS — E039 Hypothyroidism, unspecified: Secondary | ICD-10-CM

## 2023-02-09 DIAGNOSIS — I480 Paroxysmal atrial fibrillation: Secondary | ICD-10-CM | POA: Diagnosis not present

## 2023-02-09 DIAGNOSIS — I10 Essential (primary) hypertension: Secondary | ICD-10-CM

## 2023-02-09 DIAGNOSIS — K219 Gastro-esophageal reflux disease without esophagitis: Secondary | ICD-10-CM | POA: Diagnosis not present

## 2023-02-09 DIAGNOSIS — I129 Hypertensive chronic kidney disease with stage 1 through stage 4 chronic kidney disease, or unspecified chronic kidney disease: Secondary | ICD-10-CM

## 2023-02-09 DIAGNOSIS — R296 Repeated falls: Secondary | ICD-10-CM

## 2023-02-09 DIAGNOSIS — R9431 Abnormal electrocardiogram [ECG] [EKG]: Secondary | ICD-10-CM | POA: Diagnosis not present

## 2023-02-09 DIAGNOSIS — I2510 Atherosclerotic heart disease of native coronary artery without angina pectoris: Secondary | ICD-10-CM | POA: Diagnosis not present

## 2023-02-09 DIAGNOSIS — Z7901 Long term (current) use of anticoagulants: Secondary | ICD-10-CM | POA: Diagnosis not present

## 2023-02-09 NOTE — Patient Instructions (Signed)
It was great to see you!  Our plans for today:  - No changes to your medications. - Come back in 6 months for follow up.   Take care and seek immediate care sooner if you develop any concerns.   Dr. Linwood Dibbles

## 2023-02-09 NOTE — Assessment & Plan Note (Signed)
Asymptomatic. On appropriate therapy. Continue to follow with Cardiology.

## 2023-02-09 NOTE — Assessment & Plan Note (Signed)
No falls in last few months. Does not have stairs in home. Continue to ambulate with walker.

## 2023-02-09 NOTE — Assessment & Plan Note (Signed)
Initially elevated but improved on recheck and at goal. With wide pulse pressure.

## 2023-02-09 NOTE — Assessment & Plan Note (Signed)
Not on ARB due to hypotension, wide pulse pressure. Continue to monitor.

## 2023-02-09 NOTE — Assessment & Plan Note (Signed)
Reviewed recent labs, no changes.

## 2023-02-09 NOTE — Assessment & Plan Note (Signed)
Reviewed recent labs.   Continue statin.

## 2023-02-09 NOTE — Assessment & Plan Note (Addendum)
RRR today. Reviewed recent labs. Continue to follow with cardiology.

## 2023-02-09 NOTE — Assessment & Plan Note (Signed)
Doing well on current regimen, no changes made today. Consider weaning to H2 blocker if continues to do well on current therapy.

## 2023-02-19 ENCOUNTER — Ambulatory Visit: Payer: Self-pay

## 2023-02-19 NOTE — Patient Outreach (Signed)
  Care Coordination   Follow Up Visit Note   02/19/2023 Name: Jacqueline [name] MRN: [id number] DOB: 1930-10-07  Jacqueline [name] is a 89 y.o. year old female who sees Caro Laroche, DO for primary care. I spoke with  [name] by phone today.  What matters to the patients health and wellness today?  [name]'s overall health is excellent. Her blood pressure this morning was 135/74, and her pulse was 57. She is free from headaches, chest pain, or shortness of breath. She continues to walk with her walker, and her eating and sleeping patterns are healthy. She is diligent in taking her medications and staying hydrated. Her next appointment with her cardiologist is scheduled for December.      Goals Addressed               This Visit's Progress     I want to keep my blood pressure at a  normal level (pt-stated)        Patient Goals/Self Care Activities: -Patient/Caregiver will self-administer medications as prescribed as evidenced by self-report/primary caregiver report  -Patient/Caregiver will attend all scheduled provider appointments as evidenced by clinician review of documented attendance to scheduled appointments and patient/caregiver report -Patient/Caregiver will call pharmacy for medication refills as evidenced by patient report and review of pharmacy fill history as appropriate -Patient/Caregiver will call provider office for new concerns or questions as evidenced by review of documented incoming telephone call notes and patient report -Patient/Caregiver will focus on medication adherence by taking medications as prescribed  -Calls provider office for new concerns, questions, or BP outside discussed parameters -Checks BP and records as discussed -Continue to walk with your walker  -Use fall precautions that we discussed -decrease salt in your diet        SDOH assessments and interventions completed:  No     Care Coordination Interventions:  Yes, provided    Interventions Today    Flowsheet Row Most Recent Value  Chronic Disease   Chronic disease during today's visit Hypertension (HTN)  General Interventions   General Interventions Discussed/Reviewed General Interventions Reviewed  Nutrition Interventions   Nutrition Discussed/Reviewed Nutrition Discussed, Decreasing salt  Pharmacy Interventions   Pharmacy Dicussed/Reviewed Pharmacy Topics Discussed  Safety Interventions   Safety Discussed/Reviewed Safety Reviewed        Follow up plan: Follow up call scheduled for 04/22/23  2 pm    Encounter Outcome:  Pt. Visit Completed   Juanell Fairly RN, BSN, [facility] Care Coordinator [facility]   Phone: [phone]

## 2023-02-19 NOTE — Patient Instructions (Signed)
Visit Information  Thank you for taking time to visit with me today. Please don't hesitate to contact me if I can be of assistance to you.   Following are the goals we discussed today:   Goals Addressed               This Visit's Progress     I want to keep my blood pressure at a  normal level (pt-stated)        Patient Goals/Self Care Activities: -Patient/Caregiver will self-administer medications as prescribed as evidenced by self-report/primary caregiver report  -Patient/Caregiver will attend all scheduled provider appointments as evidenced by clinician review of documented attendance to scheduled appointments and patient/caregiver report -Patient/Caregiver will call pharmacy for medication refills as evidenced by patient report and review of pharmacy fill history as appropriate -Patient/Caregiver will call provider office for new concerns or questions as evidenced by review of documented incoming telephone call notes and patient report -Patient/Caregiver will focus on medication adherence by taking medications as prescribed  -Calls provider office for new concerns, questions, or BP outside discussed parameters -Checks BP and records as discussed -Continue to walk with your walker  -Use fall precautions that we discussed -decrease salt in your diet        Our next appointment is by telephone on 04/22/23 at 2 pm  Please call the care guide team at [phone] if you need to cancel or reschedule your appointment.   If you are experiencing a Mental Health or Behavioral Health Crisis or need someone to talk to, please call 1-800-273-TALK (toll free, 24 hour hotline)  Patient verbalizes understanding of instructions and care plan provided today and agrees to view in MyChart. Active MyChart status and patient understanding of how to access instructions and care plan via MyChart confirmed with patient.     Juanell Fairly RN, BSN, [facility] Care Coordinator [facility]    Phone: [phone]

## 2023-02-26 ENCOUNTER — Other Ambulatory Visit: Payer: Self-pay | Admitting: Cardiovascular Disease

## 2023-03-11 ENCOUNTER — Telehealth: Payer: Self-pay

## 2023-03-11 NOTE — Telephone Encounter (Signed)
*  Late entry  Patients son calls nurse line requesting medicare wellness visit mailed to her home.   Wellness visit mailed to confirmed address.   Advised of turn around time due to cone mailing.

## 2023-03-30 ENCOUNTER — Other Ambulatory Visit: Payer: Self-pay | Admitting: Cardiovascular Disease

## 2023-03-30 DIAGNOSIS — I10 Essential (primary) hypertension: Secondary | ICD-10-CM

## 2023-04-16 DIAGNOSIS — D692 Other nonthrombocytopenic purpura: Secondary | ICD-10-CM | POA: Diagnosis not present

## 2023-04-16 DIAGNOSIS — N183 Chronic kidney disease, stage 3 unspecified: Secondary | ICD-10-CM | POA: Diagnosis not present

## 2023-04-16 DIAGNOSIS — I509 Heart failure, unspecified: Secondary | ICD-10-CM | POA: Diagnosis not present

## 2023-04-16 DIAGNOSIS — Z515 Encounter for palliative care: Secondary | ICD-10-CM | POA: Diagnosis not present

## 2023-04-19 ENCOUNTER — Other Ambulatory Visit: Payer: Self-pay | Admitting: Pharmacist

## 2023-04-19 NOTE — Progress Notes (Signed)
Conducted chart review for medication adherence - patient seems adherent to pravastatin as patient has supply from hospitalization (06/2022) as well as the supply her son has been picking up from the pharmacy (last 90 day fill on 11/26/22).

## 2023-04-22 ENCOUNTER — Ambulatory Visit: Payer: Self-pay

## 2023-04-22 NOTE — Patient Instructions (Signed)
Visit Information  Thank you for taking time to visit with me today. Please don't hesitate to contact me if I can be of assistance to you.   Following are the goals we discussed today:   Goals Addressed               This Visit's Progress     I want to keep my blood pressure at a  normal level (pt-stated)        Patient Goals/Self Care Activities: -Patient/Caregiver will self-administer medications as prescribed as evidenced by self-report/primary caregiver report  -Patient/Caregiver will attend all scheduled provider appointments as evidenced by clinician review of documented attendance to scheduled appointments and patient/caregiver report -Patient/Caregiver will call pharmacy for medication refills as evidenced by patient report and review of pharmacy fill history as appropriate -Patient/Caregiver will call provider office for new concerns or questions as evidenced by review of documented incoming telephone call notes and patient report -Patient/Caregiver will focus on medication adherence by taking medications as prescribed  -Calls provider office for new concerns, questions, or BP outside discussed parameters -Checks BP and records as discussed -Continue to walk with your walker  -Use fall precautions that we discussed         Our next appointment is by telephone on 05/11/23 at 2 pm  Please call the care guide team at [phone] if you need to cancel or reschedule your appointment.   If you are experiencing a Mental Health or Behavioral Health Crisis or need someone to talk to, please call 1-800-273-TALK (toll free, 24 hour hotline)  Patient verbalizes understanding of instructions and care plan provided today and agrees to view in MyChart. Active MyChart status and patient understanding of how to access instructions and care plan via MyChart confirmed with patient.     Juanell Fairly RN, BSN, [facility] Phone: [phone]

## 2023-04-22 NOTE — Patient Outreach (Signed)
Care Coordination   Follow Up Visit Note   04/22/2023 Name: Jacqueline [name] MRN: [id number] DOB: 10/05/1930  Jacqueline [name] is a 89 y.o. year old female who sees Caro Laroche, DO for primary care. I spoke with  Jacqueline [name] by phone today.  What matters to the patients health and wellness today?  Jacqueline [name]'s current health status is stable. She reports fluctuations in her blood pressure readings, with today's measurement at 156/79 and a heart rate of 69, yesterday's at 152/74, and the reading on the 24th at 124/64. I have recommended that she consult her physician regarding these variations.    Goals Addressed               This Visit's Progress     I want to keep my blood pressure at a  normal level (pt-stated)        Patient Goals/Self Care Activities: -Patient/Caregiver will self-administer medications as prescribed as evidenced by self-report/primary caregiver report  -Patient/Caregiver will attend all scheduled provider appointments as evidenced by clinician review of documented attendance to scheduled appointments and patient/caregiver report -Patient/Caregiver will call pharmacy for medication refills as evidenced by patient report and review of pharmacy fill history as appropriate -Patient/Caregiver will call provider office for new concerns or questions as evidenced by review of documented incoming telephone call notes and patient report -Patient/Caregiver will focus on medication adherence by taking medications as prescribed  -Calls provider office for new concerns, questions, or BP outside discussed parameters -Checks BP and records as discussed -Continue to walk with your walker  -Use fall precautions that we discussed         SDOH assessments and interventions completed:  No     Care Coordination Interventions:  Yes, provided   Interventions Today    Flowsheet Row Most Recent Value  Chronic Disease   Chronic disease during today's visit  Hypertension (HTN)  General Interventions   General Interventions Discussed/Reviewed General Interventions Discussed  [Continue to check bp daily, notify pcp if elevated variation continues]  Nutrition Interventions   Nutrition Discussed/Reviewed Nutrition Discussed  Pharmacy Interventions   Pharmacy Dicussed/Reviewed Pharmacy Topics Discussed  Safety Interventions   Safety Discussed/Reviewed Fall Risk, Home Safety        Follow up plan: Follow up call scheduled for 05/11/23  2 pm    Encounter Outcome:  Patient Visit Completed   Juanell Fairly RN, BSN, [facility]   Care Coordinator Phone: [phone]

## 2023-04-30 ENCOUNTER — Ambulatory Visit (INDEPENDENT_AMBULATORY_CARE_PROVIDER_SITE_OTHER): Payer: PPO | Admitting: Family Medicine

## 2023-04-30 ENCOUNTER — Other Ambulatory Visit: Payer: Self-pay

## 2023-04-30 VITALS — BP 165/65 | HR 60 | Ht 60.0 in | Wt 117.2 lb

## 2023-04-30 DIAGNOSIS — K625 Hemorrhage of anus and rectum: Secondary | ICD-10-CM

## 2023-04-30 LAB — POCT HEMOGLOBIN: Hemoglobin: 11.1 g/dL (ref 11–14.6)

## 2023-04-30 NOTE — Patient Instructions (Signed)
It was wonderful to see you today! Thank you for choosing [facility].   Please bring ALL of your medications with you to every visit.   Today we talked about:  It seems like your bleeding could most likely be secondary to the hemorrhoids you have. I do worry about a bleed in your colon as well so if you have any worsening bleeding that is passing large clots or that will not remit after couple of hours please consider going to the emergency room to be seen. Please continue to hold your Eliquis over the weekend and if you have no further bleeding episodes you can restart on Monday.  If you have further bleeding episode please continue to hold your Eliquis until you are seen on Monday. I would recommend half a capful to a capful of MiraLAX daily for the next 2 days to help you have a softer bowel movement and decrease the chance of bleeding of your hemorrhoids. Please consider more frequent check ins from family over the weekend and if you feel dizzy or lightheaded I would like you to be supervised given the risk you could pass out. I would recommend starting some oral iron supplementation every other day.  You can pick this up at a [facility] for around $2.  It does absorb better if you take it with vitamin C such as orange juice.  It can cause some mild constipation so please keep an eye on that.  Please follow up on Monday if persistent bleeding  Call the clinic at [phone] if your symptoms worsen or you have any concerns.  Please be sure to schedule follow up at the front desk before you leave today.   Elberta Fortis, DO Family Medicine

## 2023-04-30 NOTE — Progress Notes (Signed)
    SUBJECTIVE:   CHIEF COMPLAINT / HPI:   Rectal bleeding Patient reports persistent, non-painful rectal bleeding that began on Wednesday evening around 9 PM.  Reports she "bled throughout the night" and had to wear a pad.  States bleeding stopped Thursday morning but then restarted on Thursday evening until she woke up this morning and it had stopped again.  Reports it is dark blood, not occurring with bowel movement.  Last BM 5 days ago, patient only stooling 1-2 times per week.  Mild lower abdominal cramping occurred with the bleeding.  Denies chest pain, shortness of breath, excessive fatigue, dizziness and dysuria.   Patient reports she stopped taking her Eliquis on Wednesday evening when bleeding started. H/o A-fib, aware of stroke risk without taking anticoagulation.  Accompanied by daughter-in-law Amy to visit.  Lives alone but has family check up on her frequently.  PERTINENT  PMH / PSH: PAF, CAD, diastolic heart failure, hypertension, CKD3b  OBJECTIVE:   BP (!) 165/65   Pulse 60   Ht 5' (1.524 m)   Wt 117 lb 3.2 oz (53.2 kg)   SpO2 97%   BMI 22.89 kg/m    General: NAD, pleasant, able to participate in exam Cardiac: RRR, no murmurs. Respiratory: CTAB, normal effort, No wheezes, rales or rhonchi Abdomen: Bowel sounds present, nontender, nondistended Extremities: no edema or cyanosis. Skin: warm and dry, no rashes noted Neuro: alert, no obvious focal deficits Psych: Normal affect and mood Rectal: 3 internal hemorrhoids noted, 2 large hemorrhoids on the lateral surfaces and 1 smaller on the inferior surface of the rectum.  No blood or stool in the rectal vault noted.  ASSESSMENT/PLAN:   Assessment & Plan Rectal bleeding Most likely secondary to internal hemorrhoids but cannot rule out GI bleed. Vital signs stable and patient clinically doing well therefore I feel it is appropriate to continue with outpatient management.  POC hemoglobin 11.1 reassuringly. -Advised  patient to continue holding anticoagulation for the next 2 to 3 days and if she has no further bleeding episodes she may restart it at that time.  Patient aware of stroke risk off of anticoagulation. -Start taking oral iron supplementation every other day -Utilize MiraLAX for the next 2 days to encourage soft bowel movement and put less strain on hemorrhoids. -Recommend patient stay with family or have more frequent visits as she lives alone due to concern for syncope if persistent blood loss -Follow-up on 10/7 for reevaluation of symptoms -Discussed strict ED return precautions if patient bleeds heavily or becomes weak, patient and daughter-in-law in agreement.    Dr. Elberta Fortis, DO [facility]

## 2023-05-01 NOTE — Progress Notes (Deleted)
    SUBJECTIVE:   CHIEF COMPLAINT / HPI:   Rectal bleeding  PERTINENT  PMH / PSH: ***  OBJECTIVE:   There were no vitals taken for this visit. ***  General: NAD, pleasant, able to participate in exam Cardiac: RRR, no murmurs. Respiratory: CTAB, normal effort, No wheezes, rales or rhonchi Abdomen: Bowel sounds present, nontender, nondistended Extremities: no edema or cyanosis. Skin: warm and dry, no rashes noted Neuro: alert, no obvious focal deficits Psych: Normal affect and mood  ASSESSMENT/PLAN:   No problem-specific Assessment & Plan notes found for this encounter.     Dr. Elberta Fortis, DO [facility]    {    This will disappear when note is signed, click to select method of visit    :1}

## 2023-05-03 ENCOUNTER — Ambulatory Visit: Payer: Self-pay

## 2023-05-07 ENCOUNTER — Other Ambulatory Visit: Payer: Self-pay | Admitting: Family Medicine

## 2023-05-07 DIAGNOSIS — F4329 Adjustment disorder with other symptoms: Secondary | ICD-10-CM

## 2023-05-07 DIAGNOSIS — I480 Paroxysmal atrial fibrillation: Secondary | ICD-10-CM

## 2023-05-11 ENCOUNTER — Ambulatory Visit: Payer: Self-pay

## 2023-05-11 NOTE — Patient Outreach (Signed)
Care Coordination   Follow Up Visit Note   05/11/2023 Name: Ibtisam Duerst MRN: [id number] DOB: 1930-10-28  [place] Dawes is a 89 y.o. year old female who sees Caro Laroche, DO for primary care. I spoke with  [name] by phone today.  What matters to the patients health and wellness today?  Mrs. Magyar is doing very well. Her blood pressure today is 151/71. She mentioned that it's still fluctuating, but she denies any headaches or chest pain. She continues to use her walker in the house and has not experienced any falls. Her appetite is still inconsistent, but she's eating, sleeping well, and taking her medicines as prescribed.    Goals Addressed               This Visit's Progress     I want to keep my blood pressure at a  normal level (pt-stated)        Patient Goals/Self Care Activities: -Patient/Caregiver will take medications as prescribed   -Patient/Caregiver will attend all scheduled provider appointments -Patient/Caregiver will call pharmacy for medication refills 3-7 days in advance of running out of medications -Patient/Caregiver will call provider office for new concerns or questions  -Patient/Caregiver will focus on medication adherence by taking medications as prescribed   -Calls provider office for new concerns, questions, or BP outside discussed parameters -Checks BP and records as discussed -Continue to walk with your walker  -Use fall precautions that we discussed         SDOH assessments and interventions completed:  No     Care Coordination Interventions:  Yes, provided   Interventions Today    Flowsheet Row Most Recent Value  Chronic Disease   Chronic disease during today's visit Hypertension (HTN)  General Interventions   General Interventions Discussed/Reviewed General Interventions Discussed  Nutrition Interventions   Nutrition Discussed/Reviewed Nutrition Discussed  Pharmacy Interventions   Pharmacy Dicussed/Reviewed  Pharmacy Topics Discussed  Safety Interventions   Safety Discussed/Reviewed Home Safety, Safety Discussed        Follow up plan: Follow up call scheduled for 06/11/23  1130 am    Encounter Outcome:  Patient Visit Completed   Juanell Fairly RN, BSN, [facility]   Care Coordinator Phone: [phone]

## 2023-05-11 NOTE — Patient Instructions (Signed)
Visit Information  Thank you for taking time to visit with me today. Please don't hesitate to contact me if I can be of assistance to you.   Following are the goals we discussed today:   Goals Addressed               This Visit's Progress     I want to keep my blood pressure at a  normal level (pt-stated)        Patient Goals/Self Care Activities: -Patient/Caregiver will take medications as prescribed   -Patient/Caregiver will attend all scheduled provider appointments -Patient/Caregiver will call pharmacy for medication refills 3-7 days in advance of running out of medications -Patient/Caregiver will call provider office for new concerns or questions  -Patient/Caregiver will focus on medication adherence by taking medications as prescribed   -Calls provider office for new concerns, questions, or BP outside discussed parameters -Checks BP and records as discussed -Continue to walk with your walker  -Use fall precautions that we discussed         Our next appointment is by telephone on 06/11/23 at 1130 am  Please call the care guide team at [phone] if you need to cancel or reschedule your appointment.   If you are experiencing a Mental Health or Behavioral Health Crisis or need someone to talk to, please call 1-800-273-TALK (toll free, 24 hour hotline)  Patient verbalizes understanding of instructions and care plan provided today and agrees to view in MyChart. Active MyChart status and patient understanding of how to access instructions and care plan via MyChart confirmed with patient.     Juanell Fairly RN, BSN, [facility] [employer] Phone: [phone]

## 2023-05-14 ENCOUNTER — Other Ambulatory Visit: Payer: Self-pay | Admitting: Cardiovascular Disease

## 2023-05-14 DIAGNOSIS — I10 Essential (primary) hypertension: Secondary | ICD-10-CM

## 2023-05-21 ENCOUNTER — Other Ambulatory Visit: Payer: Self-pay | Admitting: Cardiovascular Disease

## 2023-05-21 DIAGNOSIS — I10 Essential (primary) hypertension: Secondary | ICD-10-CM

## 2023-05-29 ENCOUNTER — Telehealth: Payer: Self-pay | Admitting: Cardiology

## 2023-05-29 NOTE — Telephone Encounter (Signed)
Outpatient service line: CC: Medication refill for hydralazine  Patient requesting refill for her hydralazine however her previous office notes states that she is only on amlodipine.  She reports that her son manages her medications so she does not know exactly what she takes but she is adamant that she has been taking both amlodipine and hydralazine.  Forwarding message to primary cardiologist to determine exactly what she should be on.  She did take her blood pressure and stated that it was in the 160s.  I have instructed her to discuss with son exactly what she is taking and that we would follow-up with her for any recommendations.

## 2023-05-30 NOTE — Telephone Encounter (Signed)
She had a Rx for hydralazine 25 mg tabs to take if SBP>160  mm Hg. Can we please refill that?

## 2023-06-01 MED ORDER — HYDRALAZINE HCL 25 MG PO TABS: 25.0000 mg | ORAL_TABLET | Freq: Three times a day (TID) | ORAL | 3 refills | Status: DC | PRN

## 2023-06-01 NOTE — Addendum Note (Signed)
Addended by: Scheryl Marten on: 06/01/2023 09:20 AM   Modules accepted: Orders

## 2023-06-01 NOTE — Telephone Encounter (Signed)
Spoke with this patient; went over directions; may take 25 mg hydralazine up to 3 times a day for BP > 160. She verbalized understanding. Sent to [facility] on [address].  She states that her BP today is 162/86 hr58. Instructed her to let us know, if after taking this medication, her BP is still consistently over 160. She states that she will let us know.

## 2023-06-04 ENCOUNTER — Other Ambulatory Visit: Payer: Self-pay | Admitting: Cardiovascular Disease

## 2023-06-04 DIAGNOSIS — I480 Paroxysmal atrial fibrillation: Secondary | ICD-10-CM

## 2023-06-04 NOTE — Telephone Encounter (Signed)
Eliquis 2.5mg  refill request received. Patient is 89 years old, weight-53.2kg, Crea-1.25 on 01/18/23, Diagnosis-Afib, and last seen by Dr. Royann Shivers on 01/11/23. Dose is appropriate based on dosing criteria. Will send in refill to requested pharmacy.

## 2023-06-11 ENCOUNTER — Other Ambulatory Visit: Payer: Self-pay | Admitting: *Deleted

## 2023-06-11 ENCOUNTER — Other Ambulatory Visit: Payer: Self-pay | Admitting: Family Medicine

## 2023-06-11 NOTE — Patient Outreach (Signed)
Care Management   Visit Note  06/11/2023 Name: Jacqueline [name] MRN: [id number] DOB: August 24, 1930  Subjective: Jacqueline [name] is a 89 y.o. year old female who is a primary care patient of Caro Laroche, DO. The Care Management team was consulted for assistance.      Engaged with patient spoke with patient by telephone.    Goals Addressed             This Visit's Progress    RNCM Care Management Exepected Outcome: Monitor, Self-Manage and Reduce Symptoms: HTN, AFIB       Current Barriers:  Knowledge Deficits related to plan of care for management of Atrial Fibrillation and HTN  Chronic Disease Management support and education needs related to Atrial Fibrillation and HTN   RNCM Clinical Goal(s):  Patient will verbalize basic understanding of  Atrial Fibrillation and HTN disease process and self health management plan as evidenced by verbal explanation, recognizing symptoms, lifestyle modifications take all medications exactly as prescribed and will call provider for medication related questions as evidenced by being compliant with all medications attend all scheduled medical appointments: primary care provider and specialist as evidenced by maintaining all scheduled appointments continue to work with RN Care Manager to address care management and care coordination needs related to  Atrial Fibrillation and HTN as evidenced by adherence to CM Team Scheduled appointments through collaboration with RN Care manager, provider, and care team.   Interventions: Evaluation of current treatment plan related to  self management and patient's adherence to plan as established by provider   AFIB Interventions: (Status:  Condition stable.  Not addressed this visit.) Long Term Goal Counseled on increased risk of stroke due to Afib and benefits of anticoagulation for stroke prevention Reviewed importance of adherence to anticoagulant exactly as prescribed Counseled on bleeding risk associated  with taking Eliquis and importance of self-monitoring for signs/symptoms of bleeding. Patient states no bleeding or bruising noted at this time. Counseled on avoidance of NSAIDs due to increased bleeding risk with anticoagulants Counseled on importance of regular laboratory monitoring as prescribed Counseled on seeking medical attention after a head injury or if there is blood in the urine/stool Screening for signs and symptoms of depression related to chronic disease state    Hypertension Interventions:  (Status:  Goal on track:  Yes.) Long Term Goal Last practice recorded BP readings:  BP Readings from Last 3 Encounters:  04/30/23 (!) 165/65  02/09/23 (!) 141/70  01/11/23 138/60  Self Reported: 136/74 Hr 66 on 06/11/2023 Most recent eGFR/CrCl:  Lab Results  Component Value Date   EGFR 41 (L) 01/18/2023    No components found for: "CRCL"  Evaluation of current treatment plan related to hypertension self management and patient's adherence to plan as established by provider. Patient takes blood pressure daily and keeps log. No complaints of headaches, chest pain, or swelling in her extremities at this time. Provided education to patient re: stroke prevention, s/s of heart attack and stroke Reviewed medications with patient and discussed importance of compliance. Patient compliant with all medications. Counseled on adverse effects of illicit drug and excessive alcohol use in patients with high blood pressure  Counseled on the importance of exercise goals with target of 150 minutes per week Discussed plans with patient for ongoing care management follow up and provided patient with direct contact information for care management team Advised patient, providing education and rationale, to monitor blood pressure daily and record, calling PCP for findings outside established parameters Reviewed scheduled/upcoming  provider appointments including: Cardiology appointment on 07-13-2023 Discussed  complications of poorly controlled blood pressure such as heart disease, stroke, circulatory complications, vision complications, kidney impairment, sexual dysfunction  Patient Goals/Self-Care Activities: Take all medications as prescribed Attend all scheduled provider appointments Call pharmacy for medication refills 3-7 days in advance of running out of medications Perform all self care activities independently  Call provider office for new concerns or questions   Follow Up Plan:  Telephone follow up appointment with care management team member scheduled for:  07-16-2023 at 10:00 am             Consent to Services:  Patient was given information about care management services, agreed to services, and gave verbal consent to participate.   Plan: Telephone follow up appointment with care management team member scheduled for: 07-16-2023 at 10:00 am  Danise Edge, BSN RN RN Care Manager  [facility]  Ambulatory Care Management  Direct Number: [phone]

## 2023-06-11 NOTE — Patient Instructions (Signed)
Visit Information  Thank you for taking time to visit with me today. Please don't hesitate to contact me if I can be of assistance to you before our next scheduled telephone appointment.  Following are the goals we discussed today:   Goals Addressed             This Visit's Progress    RNCM Care Management Exepected Outcome: Monitor, Self-Manage and Reduce Symptoms: HTN, AFIB       Current Barriers:  Knowledge Deficits related to plan of care for management of Atrial Fibrillation and HTN  Chronic Disease Management support and education needs related to Atrial Fibrillation and HTN   RNCM Clinical Goal(s):  Patient will verbalize basic understanding of  Atrial Fibrillation and HTN disease process and self health management plan as evidenced by verbal explanation, recognizing symptoms, lifestyle modifications take all medications exactly as prescribed and will call provider for medication related questions as evidenced by being compliant with all medications attend all scheduled medical appointments: primary care provider and specialist as evidenced by maintaining all scheduled appointments continue to work with RN Care Manager to address care management and care coordination needs related to  Atrial Fibrillation and HTN as evidenced by adherence to CM Team Scheduled appointments through collaboration with RN Care manager, provider, and care team.   Interventions: Evaluation of current treatment plan related to  self management and patient's adherence to plan as established by provider   AFIB Interventions: (Status:  Condition stable.  Not addressed this visit.) Long Term Goal Counseled on increased risk of stroke due to Afib and benefits of anticoagulation for stroke prevention Reviewed importance of adherence to anticoagulant exactly as prescribed Counseled on bleeding risk associated with taking Eliquis and importance of self-monitoring for signs/symptoms of bleeding. Patient states no  bleeding or bruising noted at this time. Counseled on avoidance of NSAIDs due to increased bleeding risk with anticoagulants Counseled on importance of regular laboratory monitoring as prescribed Counseled on seeking medical attention after a head injury or if there is blood in the urine/stool Screening for signs and symptoms of depression related to chronic disease state    Hypertension Interventions:  (Status:  Goal on track:  Yes.) Long Term Goal Last practice recorded BP readings:  BP Readings from Last 3 Encounters:  04/30/23 (!) 165/65  02/09/23 (!) 141/70  01/11/23 138/60  Self Reported: 136/74 Hr 66 on 06/11/2023 Most recent eGFR/CrCl:  Lab Results  Component Value Date   EGFR 41 (L) 01/18/2023    No components found for: "CRCL"  Evaluation of current treatment plan related to hypertension self management and patient's adherence to plan as established by provider. Patient takes blood pressure daily and keeps log. No complaints of headaches, chest pain, or swelling in her extremities at this time. Provided education to patient re: stroke prevention, s/s of heart attack and stroke Reviewed medications with patient and discussed importance of compliance. Patient compliant with all medications. Counseled on adverse effects of illicit drug and excessive alcohol use in patients with high blood pressure  Counseled on the importance of exercise goals with target of 150 minutes per week Discussed plans with patient for ongoing care management follow up and provided patient with direct contact information for care management team Advised patient, providing education and rationale, to monitor blood pressure daily and record, calling PCP for findings outside established parameters Reviewed scheduled/upcoming provider appointments including: Cardiology appointment on 07-13-2023 Discussed complications of poorly controlled blood pressure such as heart disease, stroke, circulatory  complications,  vision complications, kidney impairment, sexual dysfunction  Patient Goals/Self-Care Activities: Take all medications as prescribed Attend all scheduled provider appointments Call pharmacy for medication refills 3-7 days in advance of running out of medications Perform all self care activities independently  Call provider office for new concerns or questions   Follow Up Plan:  Telephone follow up appointment with care management team member scheduled for:  07-16-2023 at 10:00 am           Our next appointment is by telephone on 07-16-2023 at 10:00 am  Please call the care guide team at [phone] if you need to cancel or reschedule your appointment.   If you are experiencing a Mental Health or Behavioral Health Crisis or need someone to talk to, please call the Suicide and Crisis Lifeline: 988 call the [place] National Suicide Prevention Lifeline: (681) 668-5966 or TTY: 720-128-0612 TTY 347-738-2999) to talk to a trained counselor call 1-800-273-TALK (toll free, 24 hour hotline) go to [facility] [address], [place] [phone]) call 911   Patient verbalizes understanding of instructions and care plan provided today and agrees to view in MyChart. Active MyChart status and patient understanding of how to access instructions and care plan via MyChart confirmed with patient.     Telephone follow up appointment with care management team member scheduled for: 07-16-2023 at 10:00 am  Danise Edge, BSN RN RN Care Manager  [facility]  Ambulatory Care Management  Direct Number: [phone]

## 2023-06-21 ENCOUNTER — Other Ambulatory Visit: Payer: Self-pay

## 2023-06-21 DIAGNOSIS — E039 Hypothyroidism, unspecified: Secondary | ICD-10-CM

## 2023-06-21 MED ORDER — LEVOTHYROXINE SODIUM 75 MCG PO TABS: 75.0000 ug | ORAL_TABLET | Freq: Every day | ORAL | 3 refills | Status: DC

## 2023-06-22 DIAGNOSIS — I5030 Unspecified diastolic (congestive) heart failure: Secondary | ICD-10-CM | POA: Diagnosis not present

## 2023-06-22 DIAGNOSIS — Z515 Encounter for palliative care: Secondary | ICD-10-CM | POA: Diagnosis not present

## 2023-06-22 DIAGNOSIS — D692 Other nonthrombocytopenic purpura: Secondary | ICD-10-CM | POA: Diagnosis not present

## 2023-06-25 ENCOUNTER — Other Ambulatory Visit: Payer: Self-pay | Admitting: Family Medicine

## 2023-06-25 DIAGNOSIS — G609 Hereditary and idiopathic neuropathy, unspecified: Secondary | ICD-10-CM

## 2023-07-09 ENCOUNTER — Other Ambulatory Visit: Payer: Self-pay | Admitting: Cardiovascular Disease

## 2023-07-13 ENCOUNTER — Ambulatory Visit: Payer: PPO | Attending: Cardiovascular Disease | Admitting: Cardiovascular Disease

## 2023-07-13 VITALS — BP 140/70 | Ht 61.0 in | Wt 119.0 lb

## 2023-07-13 DIAGNOSIS — D6869 Other thrombophilia: Secondary | ICD-10-CM

## 2023-07-13 DIAGNOSIS — I2721 Secondary pulmonary arterial hypertension: Secondary | ICD-10-CM

## 2023-07-13 DIAGNOSIS — I951 Orthostatic hypotension: Secondary | ICD-10-CM

## 2023-07-13 DIAGNOSIS — R9431 Abnormal electrocardiogram [ECG] [EKG]: Secondary | ICD-10-CM | POA: Diagnosis not present

## 2023-07-13 DIAGNOSIS — I129 Hypertensive chronic kidney disease with stage 1 through stage 4 chronic kidney disease, or unspecified chronic kidney disease: Secondary | ICD-10-CM | POA: Diagnosis not present

## 2023-07-13 DIAGNOSIS — N1831 Chronic kidney disease, stage 3a: Secondary | ICD-10-CM

## 2023-07-13 DIAGNOSIS — Z79899 Other long term (current) drug therapy: Secondary | ICD-10-CM | POA: Diagnosis not present

## 2023-07-13 DIAGNOSIS — E7800 Pure hypercholesterolemia, unspecified: Secondary | ICD-10-CM

## 2023-07-13 DIAGNOSIS — I5032 Chronic diastolic (congestive) heart failure: Secondary | ICD-10-CM

## 2023-07-13 DIAGNOSIS — Z5181 Encounter for therapeutic drug level monitoring: Secondary | ICD-10-CM | POA: Diagnosis not present

## 2023-07-13 DIAGNOSIS — I480 Paroxysmal atrial fibrillation: Secondary | ICD-10-CM | POA: Diagnosis not present

## 2023-07-13 DIAGNOSIS — I2510 Atherosclerotic heart disease of native coronary artery without angina pectoris: Secondary | ICD-10-CM

## 2023-07-13 MED ORDER — APIXABAN 2.5 MG PO TABS
2.5000 mg | ORAL_TABLET | Freq: Two times a day (BID) | ORAL | 0 refills | Status: DC
Start: 2023-07-13 — End: 2024-02-06

## 2023-07-13 NOTE — Progress Notes (Signed)
Cardiology Office Note    Date:  07/16/2023   ID:  Jacqueline, [name] 10-10-1930, MRN [id number]  PCP:  Jacqueline Laroche, DO  Cardiologist:  Kyleeann Cremeans Electrophysiologist:  None   Evaluation Performed:  Follow-Up Visit  Chief Complaint:  F/U AFib  History of Present Illness:    Jacqueline [name] is a 89 y.o. female with coronary artery disease and previous bypass surgery (2006), paroxysmal atrial fibrillation requiring previous cardioversion, diastolic heart failure, HTN.  She is generally doing well other than the fact that she feels tired.  She feels that this is probably appropriate for her age of 89 years old.  Thankfully she has not had any further falls.  She has not had dizziness or syncope and is not troubled by palpitations.  Relatively sedentary, she does not have angina or shortness of breath with activity.  Mostly her falls have been related to toppling over when she has been changing position.  She has not had any bleeding problems otherwise.  In the past, attempts to aggressively lower her blood pressure have led to syncope and falls and at one point she was actually taking midodrine for hypotension.  We are currently targeting a systolic blood pressure around 150 and they are avoiding reaching "perfect" blood pressure readings.  She has not had any recurrence of atrial fibrillation.  She is on chronic amiodarone therapy.  She had a protracted episode of persistent atrial fibrillation in summer 2020 on a low-dose of amiodarone.  The dose of amiodarone was increased back to 200 mg daily but before she could undergo cardioversion she presented with rectal bleeding due to hemorrhoids and diverticulosis which required an interruption in her anticoagulants.  She eventually underwent TEE guided cardioversion on July 6.  She had transient atrial fibrillation during urinary tract infection in October 2021.    Past Medical History:  Diagnosis Date   Abnormal ECG 10/22/2021    Arthritis    back   Atrial fibrillation with rapid ventricular response (HCC) 02/24/2014   a. CHA2DS2VASc = 6 -> on eliquis;  b. 02/2014 s/p DCCV;  c. 08/2014 Echo: EF 55-60%, Gr 2 DD, mild MR, triv AI.   Bronchitis 10/22/2021   CAD (coronary artery disease)    a. s/p CABG x 2 (LIMA->LAD, VG->Diag);  b. 08/2014 Cath: LM nl, LAD 90p, LCX nl, RCA nl/dominant, LIMA->LAD atretic, VG->D2 patent w/ retrograde filling of LAD, EF 55-60%-->Med Rx.   Diverticulitis 02/21/2012   Elevated troponin 10/22/2021   GERD (gastroesophageal reflux disease)    Hyperlipemia    Hypertension    Insomnia    Vertigo 11/24/2018   Past Surgical History:  Procedure Laterality Date   ABDOMINAL HYSTERECTOMY  1975   CARDIAC CATHETERIZATION N/A 05/09/2015   Procedure: Right Heart Cath;  Surgeon: Laurey Morale, MD;  Location: [facility] INVASIVE CV LAB;  Service: Cardiovascular;  Laterality: N/A;   CARDIOVERSION N/A 02/28/2014   Procedure: CARDIOVERSION;  Surgeon: Thurmon Fair, MD;  Location: [facility] ENDOSCOPY;  Service: Cardiovascular;  Laterality: N/A;   CARDIOVERSION N/A 01/30/2019   Procedure: CARDIOVERSION;  Surgeon: Pricilla Riffle, MD;  Location: [facility] ENDOSCOPY;  Service: Cardiovascular;  Laterality: N/A;   CATARACT EXTRACTION Bilateral 2014   with lens implanted   COLONOSCOPY N/A 02/19/2015   Procedure: COLONOSCOPY;  Surgeon: Meryl Dare, MD;  Location: [facility] ENDOSCOPY;  Service: Endoscopy;  Laterality: N/A;   CORONARY ARTERY BYPASS GRAFT  2006   off-pump bypass surgery with LIMA to the LAD and SVG  to the second diagonal artery ( performed by Dr Tyrone Sage)    FEMUR IM NAIL Right 07/18/2022   Procedure: INTRAMEDULLARY (IM) NAIL, HIP;  Surgeon: Sheral Apley, MD;  Location: [facility] OR;  Service: Orthopedics;  Laterality: Right;   JOINT REPLACEMENT Bilateral    L-2004, R-2006   LEFT HEART CATHETERIZATION WITH CORONARY/GRAFT ANGIOGRAM N/A 09/17/2014   Procedure: LEFT HEART CATHETERIZATION WITH Isabel Caprice;  Surgeon:  Lennette Bihari, MD;  Location: [facility] CATH LAB;  Service: Cardiovascular;  Laterality: N/A;   REIMPLANTATION OF TOTAL KNEE Right 10/08/2014   Procedure: REIMPLANTATION OF RIGHT TOTAL KNEE ARTHROPLASTY WITH REMOVAL OF ANTIBIOTIC SPACER;  Surgeon: Durene Romans, MD;  Location: [facility] ORS;  Service: Orthopedics;  Laterality: Right;   ROTATOR CUFF REPAIR Bilateral    r-1999, l- 2005   TEE WITHOUT CARDIOVERSION N/A 02/28/2014   Procedure: TRANSESOPHAGEAL ECHOCARDIOGRAM (TEE);  Surgeon: Thurmon Fair, MD;  Location: [facility] ENDOSCOPY;  Service: Cardiovascular;  Laterality: N/A;   TEE WITHOUT CARDIOVERSION N/A 01/30/2019   Procedure: TRANSESOPHAGEAL ECHOCARDIOGRAM (TEE);  Surgeon: Pricilla Riffle, MD;  Location: [facility] ENDOSCOPY;  Service: Cardiovascular;  Laterality: N/A;   TOTAL KNEE ARTHROPLASTY Right 07/02/2014   Procedure: Resection of Infected Right Total Knee Arthroplasty with placement antibiotic spacer;  Surgeon: Shelda Pal, MD;  Location: [facility] ORS;  Service: Orthopedics;  Laterality: Right;     Current Meds  Medication Sig   acetaminophen (TYLENOL) 325 MG tablet Take 2 tablets (650 mg total) by mouth every 6 (six) hours.   ALPRAZolam (XANAX) 0.5 MG tablet TAKE 1 TABLET BY MOUTH AT BEDTIME AS NEEDED FOR ANXIETY.   amiodarone (PACERONE) 200 MG tablet Take 1 tablet (200 mg total) by mouth daily.   amLODipine (NORVASC) 5 MG tablet TAKE 1 TABLET (5 MG TOTAL) BY MOUTH DAILY.   apixaban (ELIQUIS) 2.5 MG TABS tablet Take 1 tablet (2.5 mg total) by mouth 2 (two) times daily.   apixaban (ELIQUIS) 2.5 MG TABS tablet Take 1 tablet (2.5 mg total) by mouth 2 (two) times daily.   cholecalciferol (VITAMIN D3) 25 MCG (1000 UNIT) tablet Take 1,000 Units by mouth daily.   cycloSPORINE (RESTASIS) 0.05 % ophthalmic emulsion Place 1 drop into both eyes 2 (two) times daily.   ELIQUIS 2.5 MG TABS tablet TAKE 1 TABLET BY MOUTH TWICE A DAY   gabapentin (NEURONTIN) 100 MG capsule TAKE 1 CAPSULE (100 MG TOTAL) BY MOUTH 3 TIMES A DAY    hydrALAZINE (APRESOLINE) 25 MG tablet Take 1 tablet (25 mg total) by mouth 3 (three) times daily as needed (may take up to three times daily for BP >160).   levothyroxine (SYNTHROID) 75 MCG tablet Take 1 tablet (75 mcg total) by mouth daily.   Multiple Vitamin (MULTIVITAMIN WITH MINERALS) TABS tablet Take 1 tablet by mouth daily.   nitroGLYCERIN (NITROSTAT) 0.4 MG SL tablet Place 1 tablet (0.4 mg total) under the tongue every 5 (five) minutes as needed for chest pain.   Omega-3 Fatty Acids (FISH OIL) 1000 MG CAPS Take 1,000 mg by mouth every evening.   omeprazole (PRILOSEC) 20 MG capsule Take 1 capsule (20 mg total) by mouth daily.   Polyethyl Glycol-Propyl Glycol (LUBRICANT EYE DROPS) 0.4-0.3 % SOLN Place 1 drop into both eyes 4 (four) times daily.   polyethylene glycol (MIRALAX / GLYCOLAX) 17 g packet Take 17 g by mouth 2 (two) times daily. (Patient taking differently: Take 17 g by mouth daily as needed for mild constipation.)   pravastatin (PRAVACHOL) 40 MG tablet  TAKE 1 TABLET BY MOUTH EVERY DAY IN THE EVENING   rOPINIRole (REQUIP) 0.25 MG tablet TAKE 1 TABLET BY MOUTH 3 TIMES DAILY.   senna-docusate (SENOKOT-S) 8.6-50 MG tablet Take 2 tablets by mouth 2 (two) times daily. (Patient taking differently: Take 2 tablets by mouth at bedtime as needed for mild constipation.)     Allergies:   Atorvastatin, Beta adrenergic blockers, Crestor [rosuvastatin], Morphine and codeine, and Prednisone   Social History   Tobacco Use   Smoking status: Never   Smokeless tobacco: Never  Vaping Use   Vaping status: Never Used  Substance Use Topics   Alcohol use: No   Drug use: No     Family Hx: The patient's family history includes Arthritis in her brother; Cancer in her brother; Heart attack in her brother; Parkinson's disease in her mother. There is no history of Breast cancer.  ROS:   Please see the history of present illness.    All other systems are reviewed and are negative.  Prior CV studies:    The following studies were reviewed today:  Notes from visit with Dr. Leveda Anna on 06/10/2020  Echocardiogram 04/23/2020   1. Left ventricular ejection fraction, by estimation, is 55 to 60%. The left ventricle has normal function. The left ventricle has no regional wall motion abnormalities. Left ventricular diastolic parameters are consistent with Grade III diastolic dysfunction (restrictive). Elevated left atrial pressure.   2. Right ventricular systolic function is normal. The right ventricular size is normal. There is mildly elevated pulmonary artery systolic pressure.   3. Left atrial size was moderately dilated.   4. The mitral valve is normal in structure. Mild mitral valve regurgitation. No evidence of mitral stenosis.   5. The aortic valve is tricuspid. Aortic valve regurgitation is trivial. Mild aortic valve sclerosis is present, with no evidence of aortic valve stenosis.   6. The inferior vena cava is normal in size with greater than 50% respiratory variability, suggesting right atrial pressure of 3 mmHg.  Addendum: On my review the diastolic function does not really qualify for restrictive filling, but rather for pseudonormal filling. [facility])     Labs/Other Tests and Data Reviewed:    EKG:   EKG Interpretation Date/Time:  Tuesday July 13 2023 11:53:55 EST Ventricular Rate:  54 PR Interval:  176 QRS Duration:  108 QT Interval:  478 QTC Calculation: 453 R Axis:   21  Text Interpretation: Sinus bradycardia Minimal voltage criteria for LVH, may be normal variant ( Cornell product ) When compared with ECG of 06-Feb-2022 19:52, No significant change since last tracing Confirmed by Guliana Weyandt [phone]) on 07/13/2023 12:08:03 PM        Recent Labs: 08/31/2022: Platelets 229 04/30/2023: Hemoglobin 11.1 07/13/2023: ALT 30; BUN 17; Creatinine, Ser 1.41; Potassium 5.3; Sodium 143; TSH 1.820   Recent Lipid Panel Lab Results  Component Value Date/Time   CHOL 153 01/18/2023  11:57 AM   TRIG 147 01/18/2023 11:57 AM   HDL 50 01/18/2023 11:57 AM   CHOLHDL 3.1 01/18/2023 11:57 AM   CHOLHDL 2.8 04/08/2016 11:53 AM   LDLCALC 78 01/18/2023 11:57 AM    Wt Readings from Last 3 Encounters:  07/13/23 119 lb (54 kg)  04/30/23 117 lb 3.2 oz (53.2 kg)  02/09/23 112 lb 6.4 oz (51 kg)     Objective:    Vital Signs:  BP (!) 140/70 (BP Location: Left Arm, Patient Position: Sitting, Cuff Size: Normal)   Ht 5\' 1"  (1.549 m)  Wt 119 lb (54 kg)   SpO2 95%   BMI 22.48 kg/m      General: Alert, oriented x3, no distress, appears elderly and frail. Head: no evidence of trauma, PERRL, EOMI, no exophtalmos or lid lag, no myxedema, no xanthelasma; normal ears, nose and oropharynx Neck: normal jugular venous pulsations and no hepatojugular reflux; brisk carotid pulses without delay and no carotid bruits Chest: clear to auscultation, no signs of consolidation by percussion or palpation, normal fremitus, symmetrical and full respiratory excursions Cardiovascular: normal position and quality of the apical impulse, regular rhythm, normal first and second heart sounds, no murmurs, rubs or gallops Abdomen: no tenderness or distention, no masses by palpation, no abnormal pulsatility or arterial bruits, normal bowel sounds, no hepatosplenomegaly Extremities: no clubbing, cyanosis or edema; 2+ radial, ulnar and brachial pulses bilaterally; 2+ right femoral, posterior tibial and dorsalis pedis pulses; 2+ left femoral, posterior tibial and dorsalis pedis pulses; no subclavian or femoral bruits Neurological: grossly nonfocal Psych: Normal mood and affect     ASSESSMENT & PLAN:    1. Paroxysmal atrial fibrillation (HCC)   2. Encounter for monitoring amiodarone therapy   3. Coronary artery disease involving native coronary artery of native heart without angina pectoris   4. Chronic diastolic CHF (congestive heart failure) (HCC)   5. PAH (pulmonary artery hypertension) (HCC)   6.  Hypertensive kidney disease with stage 3a chronic kidney disease (HCC)   7. Hypercholesterolemia   8. Orthostatic hypotension   9. Long QT interval   10. Acquired thrombophilia (HCC)       AFib/flutter: Currently asymptomatic.  No clinically evident recurrences ever since we increased the amiodarone to 200 mg daily.  She had a lengthy symptomatic episode in 2020 coinciding with GI bleeding, recurrent symptomatic episode of urinary tract infection in 2021. CHADSVasc 6 (age 68, gender, CAD, CHF, HTN).  On anticoagulation. Anticoagulation: No recent bleeding problems or falls.  Has had serious falls in the past. Amiodarone: Doing reasonably well on amiodarone 200 mg once daily with mild sinus bradycardia.  Had recurrent atrial fibrillation on 100 mg daily but had symptomatic bradycardia on the 400 mg daily dose.   Had normal liver and thyroid function tests in June 2024, which we plan to check every 6 months.  Eliquis: Dose adjusted for age and renal dysfunction.  No recent bleeding issues.  Although I am concerned about her injuries and falls, at this point the benefit of the medication continues to outweigh its risk in my opinion. CAD: Asymptomatic.  She has not had angina in a long time. Only on amlodipine as antianginal therapy.  Risk factors are well addressed. CHF: Clinically euvolemic on exam.  Medical therapy limited by her risk of hypotension.  Avoiding SGLT2 inhibitors due to frequent urinary tract infections. PAH: Currently asymptomatic.  Most likely secondary to left heart diastolic failure.  CKD: Stable, most recent creatinine 1.25 (range in the last several months has been 1.1-1.5). HLP: On pravastatin rather than simvastatin due to risk of interaction with amiodarone.  Most recent LDL cholesterol little higher at 78, but acceptable. Orthostatic hypotension/HTN: Avoid excessive reduction in systolic blood pressure.  She is prone to orthostatic hypotension and has a rather low diastolic  blood pressure.  Aggressive treatment of her blood pressure has led to increased frequency of falls. UTI: Frequent recurrence, currently asymptomatic.  Long QT interval: prolonged in the past probably due to concomitant treatment with azithromycin.  Macrolides and other medications that can prolong the QT interval  should be avoided if possible.    Patient Instructions  Medication Instructions:  No changes *If you need a refill on your cardiac medications before your next appointment, please call your pharmacy*   Lab Work: CMP, TSH If you have labs (blood work) drawn today and your tests are completely normal, you will receive your results only by: MyChart Message (if you have MyChart) OR A paper copy in the mail If you have any lab test that is abnormal or we need to change your treatment, we will call you to review the results.   Follow-Up: At [facility], you and your health needs are our priority.  As part of our continuing mission to provide you with exceptional heart care, we have created designated Provider Care Teams.  These Care Teams include your primary Cardiologist (physician) and Advanced Practice Providers (APPs -  Physician Assistants and Nurse Practitioners) who all work together to provide you with the care you need, when you need it.  We recommend signing up for the patient portal called "MyChart".  Sign up information is provided on this After Visit Summary.  MyChart is used to connect with patients for Virtual Visits (Telemedicine).  Patients are able to view lab/test results, encounter notes, upcoming appointments, etc.  Non-urgent messages can be sent to your provider as well.   To learn more about what you can do with MyChart, go to [email or web address].    Your next appointment:   6 month(s)  Provider:   Thurmon Fair, MD            Signed, Thurmon Fair, MD  07/16/2023 2:30 PM    [facility]

## 2023-07-13 NOTE — Patient Instructions (Signed)
Medication Instructions:  No changes *If you need a refill on your cardiac medications before your next appointment, please call your pharmacy*   Lab Work: CMP, TSH If you have labs (blood work) drawn today and your tests are completely normal, you will receive your results only by: MyChart Message (if you have MyChart) OR A paper copy in the mail If you have any lab test that is abnormal or we need to change your treatment, we will call you to review the results.   Follow-Up: At [facility], you and your health needs are our priority.  As part of our continuing mission to provide you with exceptional heart care, we have created designated Provider Care Teams.  These Care Teams include your primary Cardiologist (physician) and Advanced Practice Providers (APPs -  Physician Assistants and Nurse Practitioners) who all work together to provide you with the care you need, when you need it.  We recommend signing up for the patient portal called "MyChart".  Sign up information is provided on this After Visit Summary.  MyChart is used to connect with patients for Virtual Visits (Telemedicine).  Patients are able to view lab/test results, encounter notes, upcoming appointments, etc.  Non-urgent messages can be sent to your provider as well.   To learn more about what you can do with MyChart, go to [email or web address].    Your next appointment:   6 month(s)  Provider:   Thurmon Fair, MD

## 2023-07-14 DIAGNOSIS — Z961 Presence of intraocular lens: Secondary | ICD-10-CM | POA: Diagnosis not present

## 2023-07-14 DIAGNOSIS — H04123 Dry eye syndrome of bilateral lacrimal glands: Secondary | ICD-10-CM | POA: Diagnosis not present

## 2023-07-14 DIAGNOSIS — H16223 Keratoconjunctivitis sicca, not specified as Sjogren's, bilateral: Secondary | ICD-10-CM | POA: Diagnosis not present

## 2023-07-14 LAB — COMPREHENSIVE METABOLIC PANEL
ALT: 30 [IU]/L (ref 0–32)
AST: 43 [IU]/L — ABNORMAL HIGH (ref 0–40)
Albumin: 4.4 g/dL (ref 3.6–4.6)
Alkaline Phosphatase: 104 [IU]/L (ref 44–121)
BUN/Creatinine Ratio: 12 (ref 12–28)
BUN: 17 mg/dL (ref 10–36)
Bilirubin Total: 0.3 mg/dL (ref 0.0–1.2)
CO2: 23 mmol/L (ref 20–29)
Calcium: 9.5 mg/dL (ref 8.7–10.3)
Chloride: 105 mmol/L (ref 96–106)
Creatinine, Ser: 1.41 mg/dL — ABNORMAL HIGH (ref 0.57–1.00)
Globulin, Total: 2.9 g/dL (ref 1.5–4.5)
Glucose: 88 mg/dL (ref 70–99)
Potassium: 5.3 mmol/L — ABNORMAL HIGH (ref 3.5–5.2)
Sodium: 143 mmol/L (ref 134–144)
Total Protein: 7.3 g/dL (ref 6.0–8.5)
eGFR: 35 mL/min/{1.73_m2} — ABNORMAL LOW (ref >59)

## 2023-07-14 LAB — TSH: TSH: 1.82 u[IU]/mL (ref 0.450–4.500)

## 2023-07-16 ENCOUNTER — Other Ambulatory Visit: Payer: Self-pay | Admitting: *Deleted

## 2023-07-16 NOTE — Patient Instructions (Signed)
Visit Information  Thank you for taking time to visit with me today. Please don't hesitate to contact me if I can be of assistance to you before our next scheduled telephone appointment.  Following are the goals we discussed today:   Goals Addressed             This Visit's Progress    RNCM Care Management Exepected Outcome: Monitor, Self-Manage and Reduce Symptoms: HTN, AFIB       Current Barriers:  Knowledge Deficits related to plan of care for management of Atrial Fibrillation and HTN  Chronic Disease Management support and education needs related to Atrial Fibrillation and HTN   RNCM Clinical Goal(s):  Patient will verbalize basic understanding of  Atrial Fibrillation and HTN disease process and self health management plan as evidenced by verbal explanation, recognizing symptoms, lifestyle modifications take all medications exactly as prescribed and will call provider for medication related questions as evidenced by being compliant with all medications attend all scheduled medical appointments: primary care provider and specialist as evidenced by maintaining all scheduled appointments continue to work with RN Care Manager to address care management and care coordination needs related to  Atrial Fibrillation and HTN as evidenced by adherence to CM Team Scheduled appointments through collaboration with RN Care manager, provider, and care team.   Interventions: Evaluation of current treatment plan related to  self management and patient's adherence to plan as established by provider   AFIB Interventions: (Status:  Condition stable.  Not addressed this visit.) Long Term Goal Counseled on increased risk of stroke due to Afib and benefits of anticoagulation for stroke prevention Reviewed importance of adherence to anticoagulant exactly as prescribed. Reports compliance with mediation Counseled on bleeding risk associated with taking Eliquis and importance of self-monitoring for  signs/symptoms of bleeding. Patient states no bleeding or bruising noted at this time. Counseled on avoidance of NSAIDs due to increased bleeding risk with anticoagulants Counseled on importance of regular laboratory monitoring as prescribed Counseled on seeking medical attention after a head injury or if there is blood in the urine/stool Screening for signs and symptoms of depression related to chronic disease state    Hypertension Interventions:  (Status:  Goal on track:  Yes.) Long Term Goal Last practice recorded BP readings:  BP Readings from Last 3 Encounters:  07/13/23 (!) 140/70  04/30/23 (!) 165/65  02/09/23 (!) 141/70  Self Reported: 142/80 Hr 63 on 07-16-2023 Most recent eGFR/CrCl:  Lab Results  Component Value Date   EGFR 35 (L) 07/13/2023    No components found for: "CRCL"  Evaluation of current treatment plan related to hypertension self management and patient's adherence to plan as established by provider. Patient takes blood pressure daily and keeps log. No complaints of headaches, chest pain, or swelling in her extremities at this time. Patient aware BP is on the higher end and reports that is where cardiology would prefer her BP to stay to decrease any other symptoms that she has had in the past. Provided education to patient re: stroke prevention, s/s of heart attack and stroke Reviewed medications with patient and discussed importance of compliance. Patient compliant with all medications. Counseled on adverse effects of illicit drug and excessive alcohol use in patients with high blood pressure  Counseled on the importance of exercise goals with target of 150 minutes per week Discussed plans with patient for ongoing care management follow up and provided patient with direct contact information for care management team Advised patient, providing education and  rationale, to monitor blood pressure daily and record, calling PCP for findings outside established  parameters Reviewed scheduled/upcoming provider appointments including:  Discussed complications of poorly controlled blood pressure such as heart disease, stroke, circulatory complications, vision complications, kidney impairment, sexual dysfunction  Patient Goals/Self-Care Activities: Take all medications as prescribed Attend all scheduled provider appointments Call pharmacy for medication refills 3-7 days in advance of running out of medications Perform all self care activities independently  Call provider office for new concerns or questions   Follow Up Plan:  Telephone follow up appointment with care management team member scheduled for:  09-03-2023 at 10:00 am           Our next appointment is by telephone on 09-03-2023 at 10:00 am  Please call the care guide team at [phone] if you need to cancel or reschedule your appointment.   If you are experiencing a Mental Health or Behavioral Health Crisis or need someone to talk to, please call the Suicide and Crisis Lifeline: 988 call the [place] National Suicide Prevention Lifeline: (810)479-9516 or TTY: 352-347-7196 TTY 515-213-4971) to talk to a trained counselor call 1-800-273-TALK (toll free, 24 hour hotline) go to [facility] [address], [place] [phone])   Patient verbalizes understanding of instructions and care plan provided today and agrees to view in MyChart. Active MyChart status and patient understanding of how to access instructions and care plan via MyChart confirmed with patient.     Telephone follow up appointment with care management team member scheduled for:09-03-2023 at 10:00 am  Danise Edge, BSN RN RN Care Manager  [facility]  Ambulatory Care Management  Direct Number: [phone]

## 2023-07-16 NOTE — Patient Outreach (Signed)
Care Management   Visit Note  07/16/2023 Name: Jacqueline [name] MRN: [id number] DOB: [birth year]  Subjective: Jacqueline [name] is a 89 y.o. year old female who is a primary care patient of Caro Laroche, DO. The Care Management team was consulted for assistance.      Engaged with patient spoke with patient by telephone.    Goals Addressed             This Visit's Progress    RNCM Care Management Exepected Outcome: Monitor, Self-Manage and Reduce Symptoms: HTN, AFIB       Current Barriers:  Knowledge Deficits related to plan of care for management of Atrial Fibrillation and HTN  Chronic Disease Management support and education needs related to Atrial Fibrillation and HTN   RNCM Clinical Goal(s):  Patient will verbalize basic understanding of  Atrial Fibrillation and HTN disease process and self health management plan as evidenced by verbal explanation, recognizing symptoms, lifestyle modifications take all medications exactly as prescribed and will call provider for medication related questions as evidenced by being compliant with all medications attend all scheduled medical appointments: primary care provider and specialist as evidenced by maintaining all scheduled appointments continue to work with RN Care Manager to address care management and care coordination needs related to  Atrial Fibrillation and HTN as evidenced by adherence to CM Team Scheduled appointments through collaboration with RN Care manager, provider, and care team.   Interventions: Evaluation of current treatment plan related to  self management and patient's adherence to plan as established by provider   AFIB Interventions: (Status:  Condition stable.  Not addressed this visit.) Long Term Goal Counseled on increased risk of stroke due to Afib and benefits of anticoagulation for stroke prevention Reviewed importance of adherence to anticoagulant exactly as prescribed. Reports compliance with  mediation Counseled on bleeding risk associated with taking Eliquis and importance of self-monitoring for signs/symptoms of bleeding. Patient states no bleeding or bruising noted at this time. Counseled on avoidance of NSAIDs due to increased bleeding risk with anticoagulants Counseled on importance of regular laboratory monitoring as prescribed Counseled on seeking medical attention after a head injury or if there is blood in the urine/stool Screening for signs and symptoms of depression related to chronic disease state    Hypertension Interventions:  (Status:  Goal on track:  Yes.) Long Term Goal Last practice recorded BP readings:  BP Readings from Last 3 Encounters:  07/13/23 (!) 140/70  04/30/23 (!) 165/65  02/09/23 (!) 141/70  Self Reported: 142/80 Hr 63 on 07-16-2023 Most recent eGFR/CrCl:  Lab Results  Component Value Date   EGFR 35 (L) 07/13/2023    No components found for: "CRCL"  Evaluation of current treatment plan related to hypertension self management and patient's adherence to plan as established by provider. Patient takes blood pressure daily and keeps log. No complaints of headaches, chest pain, or swelling in her extremities at this time. Patient aware BP is on the higher end and reports that is where cardiology would prefer her BP to stay to decrease any other symptoms that she has had in the past. Provided education to patient re: stroke prevention, s/s of heart attack and stroke Reviewed medications with patient and discussed importance of compliance. Patient compliant with all medications. Counseled on adverse effects of illicit drug and excessive alcohol use in patients with high blood pressure  Counseled on the importance of exercise goals with target of 150 minutes per week Discussed plans with patient for ongoing  care management follow up and provided patient with direct contact information for care management team Advised patient, providing education and  rationale, to monitor blood pressure daily and record, calling PCP for findings outside established parameters Reviewed scheduled/upcoming provider appointments including:  Discussed complications of poorly controlled blood pressure such as heart disease, stroke, circulatory complications, vision complications, kidney impairment, sexual dysfunction  Patient Goals/Self-Care Activities: Take all medications as prescribed Attend all scheduled provider appointments Call pharmacy for medication refills 3-7 days in advance of running out of medications Perform all self care activities independently  Call provider office for new concerns or questions   Follow Up Plan:  Telephone follow up appointment with care management team member scheduled for:  09-03-2023 at 10:00 am              Consent to Services:  Patient was given information about care management services, agreed to services, and gave verbal consent to participate.   Plan: Telephone follow up appointment with care management team member scheduled for:09-03-2023 at 10:00 am  Danise Edge, BSN RN RN Care Manager  [facility]  Ambulatory Care Management  Direct Number: [phone]

## 2023-08-05 ENCOUNTER — Other Ambulatory Visit: Payer: Self-pay | Admitting: Family Medicine

## 2023-08-05 DIAGNOSIS — I480 Paroxysmal atrial fibrillation: Secondary | ICD-10-CM

## 2023-08-06 ENCOUNTER — Other Ambulatory Visit: Payer: Self-pay | Admitting: Family Medicine

## 2023-08-06 DIAGNOSIS — F4329 Adjustment disorder with other symptoms: Secondary | ICD-10-CM

## 2023-08-10 ENCOUNTER — Telehealth: Payer: Self-pay | Admitting: Cardiovascular Disease

## 2023-08-10 NOTE — Telephone Encounter (Addendum)
 Last eliquis  2.5 refill sent on 06/04/23 for 90 day supply a refill. Will call pharmacy to see if they have that refill  Spoke with Pharmacy and they state they do have the 06/04/23 eliquis  rx on file and that pt has been getting a 30 day supply of the eliquis  and has never wanted a 90 day refill of it. Will update the pt that she can get the 90 day if wanted and to call the pharmacy regarding this.   Spoke with pt and advised that I spoke with pharmacy and they have the 90 day supply on file. She states well they on gave her 15 pills and that cost her 47 dollars. Asked her if she inquired about the small portion of pills and she stated her soon went to pick it up so no one did. Advised her to call the pharmacy regarding this and if it gets to be too much in cost that she will need to let her Physician know so an alternative can be prescribed. She verbalized understanding.

## 2023-08-10 NOTE — Telephone Encounter (Signed)
 Pt c/o medication issue:  1. Name of Medication:   ELIQUIS  2.5 MG TABS tablet    2. How are you currently taking this medication (dosage and times per day)? TAKE 1 TABLET BY MOUTH TWICE A DAY  3. Are you having a reaction (difficulty breathing--STAT)? no  4. What is your medication issue? Pt states that only she was not allowed to get her 90day refill from pharmacy.

## 2023-08-25 ENCOUNTER — Other Ambulatory Visit: Payer: Self-pay

## 2023-08-25 ENCOUNTER — Emergency Department (HOSPITAL_COMMUNITY): Payer: PPO

## 2023-08-25 ENCOUNTER — Emergency Department (HOSPITAL_COMMUNITY)
Admission: EM | Admit: 2023-08-25 | Discharge: 2023-08-26 | Disposition: A | Discharge status: Home / Self Care | Payer: PPO | Attending: Emergency Medicine | Admitting: Emergency Medicine

## 2023-08-25 ENCOUNTER — Encounter (HOSPITAL_COMMUNITY): Payer: Self-pay | Admitting: *Deleted

## 2023-08-25 DIAGNOSIS — I700 Atherosclerosis of aorta: Secondary | ICD-10-CM | POA: Diagnosis not present

## 2023-08-25 DIAGNOSIS — R079 Chest pain, unspecified: Secondary | ICD-10-CM

## 2023-08-25 DIAGNOSIS — I509 Heart failure, unspecified: Secondary | ICD-10-CM | POA: Diagnosis not present

## 2023-08-25 DIAGNOSIS — I5033 Acute on chronic diastolic (congestive) heart failure: Secondary | ICD-10-CM | POA: Diagnosis not present

## 2023-08-25 DIAGNOSIS — R200 Anesthesia of skin: Secondary | ICD-10-CM | POA: Diagnosis not present

## 2023-08-25 DIAGNOSIS — Z7901 Long term (current) use of anticoagulants: Secondary | ICD-10-CM | POA: Insufficient documentation

## 2023-08-25 DIAGNOSIS — R0602 Shortness of breath: Secondary | ICD-10-CM | POA: Insufficient documentation

## 2023-08-25 DIAGNOSIS — R0789 Other chest pain: Secondary | ICD-10-CM | POA: Insufficient documentation

## 2023-08-25 DIAGNOSIS — I110 Hypertensive heart disease with heart failure: Secondary | ICD-10-CM | POA: Diagnosis not present

## 2023-08-25 LAB — BRAIN NATRIURETIC PEPTIDE: B Natriuretic Peptide: 297.6 pg/mL — ABNORMAL HIGH (ref 0.0–100.0)

## 2023-08-25 LAB — CBC
HCT: 35.3 % — ABNORMAL LOW (ref 36.0–46.0)
Hemoglobin: 11.3 g/dL — ABNORMAL LOW (ref 12.0–15.0)
MCH: 31.8 pg (ref 26.0–34.0)
MCHC: 32 g/dL (ref 30.0–36.0)
MCV: 99.4 fL (ref 80.0–100.0)
Platelets: 217 10*3/uL (ref 150–400)
RBC: 3.55 MIL/uL — ABNORMAL LOW (ref 3.87–5.11)
RDW: 15.8 % — ABNORMAL HIGH (ref 11.5–15.5)
WBC: 4.3 10*3/uL (ref 4.0–10.5)
nRBC: 0 % (ref 0.0–0.2)

## 2023-08-25 LAB — BASIC METABOLIC PANEL
Anion gap: 10 (ref 5–15)
BUN: 18 mg/dL (ref 8–23)
CO2: 23 mmol/L (ref 22–32)
Calcium: 9 mg/dL (ref 8.9–10.3)
Chloride: 107 mmol/L (ref 98–111)
Creatinine, Ser: 1.33 mg/dL — ABNORMAL HIGH (ref 0.44–1.00)
GFR, Estimated: 38 mL/min — ABNORMAL LOW (ref >60)
Glucose, Bld: 92 mg/dL (ref 70–99)
Potassium: 3.9 mmol/L (ref 3.5–5.1)
Sodium: 140 mmol/L (ref 135–145)

## 2023-08-25 LAB — TROPONIN I (HIGH SENSITIVITY)
Troponin I (High Sensitivity): 11 ng/L (ref <18)
Troponin I (High Sensitivity): 12 ng/L (ref <18)

## 2023-08-25 NOTE — ED Triage Notes (Signed)
The pt reports that she has had chest tightness since yesterday  worse since last pm sl sob  none now

## 2023-08-25 NOTE — ED Provider Triage Note (Signed)
Emergency Medicine Provider Triage Evaluation Note  Jacqueline [name] , a 89 y.o. female  was evaluated in triage.  Pt complains of chest pain.  Review of Systems  Positive:  Negative:   Physical Exam  BP (!) 176/73   Pulse 64   Temp 98.6 F (37 C)   Resp 18   Ht 5\' 1"  (1.549 m)   Wt 54 kg   SpO2 95%   BMI 22.49 kg/m  Gen:   Awake, no distress   Resp:  Normal effort  MSK:   Moves extremities without difficulty  Other:    Medical Decision Making  Medically screening exam initiated at 6:58 PM.  Appropriate orders placed.  Jacqueline [name] was informed that the remainder of the evaluation will be completed by another provider, this initial triage assessment does not replace that evaluation, and the importance of remaining in the ED until their evaluation is complete.  Chest tightness since yesterday progressing in severity since last night. Also stating that her arms hurt and feels like she has been running a lot and hands feel numb too. Patient with extensive cardiac hx. Also with SOB.  Denies fever, cough, nausea, vomiting, diarrhea.    Jacqueline [name], [place] 08/25/23 1900

## 2023-08-25 NOTE — ED Triage Notes (Signed)
The pt reports that she has pain in both arms

## 2023-08-26 ENCOUNTER — Telehealth: Payer: Self-pay | Admitting: Cardiovascular Disease

## 2023-08-26 MED ORDER — FUROSEMIDE 20 MG PO TABS: 20.0000 mg | ORAL_TABLET | Freq: Every day | ORAL | 0 refills | Status: DC

## 2023-08-26 MED ORDER — POTASSIUM CHLORIDE CRYS ER 20 MEQ PO TBCR: 20.0000 meq | EXTENDED_RELEASE_TABLET | Freq: Every day | ORAL | 0 refills | Status: DC

## 2023-08-26 NOTE — Telephone Encounter (Signed)
Pt c/o medication issue:  1. Name of Medication:   furosemide (LASIX) 20 MG tablet  potassium chloride SA (KLOR-CON M) 20 MEQ tablet   2. How are you currently taking this medication (dosage and times per day)?   As prescribed  3. Are you having a reaction (difficulty breathing--STAT)?   No  4. What is your medication issue?   Patient stated she was went to the ED for tightness in her chest and pain in both arms.  Patient stated they prescribed her these new medications and wants a call back to discuss these medications. Patient stated she did not want an ED f/u visit.

## 2023-08-26 NOTE — ED Provider Notes (Signed)
[facility] EM[facility]GENCY DEPARTMENT AT [facility] Provider Note   CSN: [id number] Arrival date & time: 08/25/23  1827     History  Chief Complaint  Patient presents with   Chest Pain    Jacqueline [name] is a 89 y.o. female.  Patient presents to the department for evaluation of tightness in her chest, shortness of breath, pain in her arms.  Symptoms present for 2 days.  She denies cough and congestion.       Home Medications Prior to Admission medications   Medication Sig Start Date End Date Taking? Authorizing Provider  furosemide (LASIX) 20 MG tablet Take 1 tablet (20 mg total) by mouth daily. 08/26/23  Yes Mirko Tailor, Canary Brim, MD  potassium chloride SA (KLOR-CON M) 20 MEQ tablet Take 1 tablet (20 mEq total) by mouth daily. 08/26/23  Yes Raymel Cull, Canary Brim, MD  acetaminophen (TYLENOL) 325 MG tablet Take 2 tablets (650 mg total) by mouth every 6 (six) hours. 07/22/22   Shelby Mattocks, DO  ALPRAZolam Prudy Feeler) 0.5 MG tablet TAKE 1 TABLET BY MOUTH AT BEDTIME AS NEEDED FOR ANXIETY 08/09/23   Caro Laroche, DO  amiodarone (PACERONE) 200 MG tablet TAKE 1 TABLET BY MOUTH EVERY DAY 08/05/23   Caro Laroche, DO  amLODipine (NORVASC) 5 MG tablet TAKE 1 TABLET (5 MG TOTAL) BY MOUTH DAILY. 03/01/23   Croitoru, Mihai, MD  apixaban (ELIQUIS) 2.5 MG TABS tablet Take 1 tablet (2.5 mg total) by mouth 2 (two) times daily. 01/11/23   Croitoru, Mihai, MD  apixaban (ELIQUIS) 2.5 MG TABS tablet Take 1 tablet (2.5 mg total) by mouth 2 (two) times daily. 07/13/23   Croitoru, Mihai, MD  cholecalciferol (VITAMIN D3) 25 MCG (1000 UNIT) tablet Take 1,000 Units by mouth daily.    [provider]  cycloSPORINE (RESTASIS) 0.05 % ophthalmic emulsion Place 1 drop into both eyes 2 (two) times daily.    [provider]  ELIQUIS 2.5 MG TABS tablet TAKE 1 TABLET BY MOUTH TWICE A DAY 06/04/23   Croitoru, Mihai, MD  gabapentin (NEURONTIN) 100 MG capsule TAKE 1 CAPSULE (100 MG TOTAL) BY  MOUTH 3 TIMES A DAY 06/28/23   Caro Laroche, DO  hydrALAZINE (APRESOLINE) 25 MG tablet Take 1 tablet (25 mg total) by mouth 3 (three) times daily as needed (may take up to three times daily for BP >160). 06/01/23 08/30/23  Croitoru, Mihai, MD  levothyroxine (SYNTHROID) 75 MCG tablet Take 1 tablet (75 mcg total) by mouth daily. 06/21/23   Caro Laroche, DO  Multiple Vitamin (MULTIVITAMIN WITH MINERALS) TABS tablet Take 1 tablet by mouth daily.    [provider]  nitroGLYCERIN (NITROSTAT) 0.4 MG SL tablet Place 1 tablet (0.4 mg total) under the tongue every 5 (five) minutes as needed for chest pain. 12/03/21   Croitoru, Mihai, MD  Omega-3 Fatty Acids (FISH OIL) 1000 MG CAPS Take 1,000 mg by mouth every evening.    [provider]  omeprazole (PRILOSEC) 20 MG capsule Take 1 capsule (20 mg total) by mouth daily. 12/16/22   Billey Co, MD  Polyethyl Glycol-Propyl Glycol (LUBRICANT EYE DROPS) 0.4-0.3 % SOLN Place 1 drop into both eyes 4 (four) times daily.    [provider]  polyethylene glycol (MIRALAX / GLYCOLAX) 17 g packet Take 17 g by mouth 2 (two) times daily. Patient taking differently: Take 17 g by mouth daily as needed for mild constipation. 02/13/22   Osvaldo Shipper, MD  pravastatin (PRAVACHOL) 40  MG tablet TAKE 1 TABLET BY MOUTH EVERY DAY IN THE EVENING 07/12/23   Croitoru, Mihai, MD  rOPINIRole (REQUIP) 0.25 MG tablet TAKE 1 TABLET BY MOUTH 3 TIMES DAILY. 06/11/23   Westley Chandler, MD  senna-docusate (SENOKOT-S) 8.6-50 MG tablet Take 2 tablets by mouth 2 (two) times daily. Patient taking differently: Take 2 tablets by mouth at bedtime as needed for mild constipation. 02/13/22   Osvaldo Shipper, MD      Allergies    Atorvastatin, Beta adrenergic blockers, Crestor [rosuvastatin], Morphine and codeine, and Prednisone    Review of Systems   Review of Systems  Physical Exam Updated Vital Signs BP (!) 179/71   Pulse (!) 59   Temp 98.7 F (37.1 C)    Resp 16   Ht 5\' 1"  (1.549 m)   Wt 54 kg   SpO2 95%   BMI 22.49 kg/m  Physical Exam Vitals and nursing note reviewed.  Constitutional:      General: She is not in acute distress.    Appearance: She is well-developed.  HENT:     Head: Normocephalic and atraumatic.     Mouth/Throat:     Mouth: Mucous membranes are moist.  Eyes:     General: Vision grossly intact. Gaze aligned appropriately.     Extraocular Movements: Extraocular movements intact.     Conjunctiva/sclera: Conjunctivae normal.  Cardiovascular:     Rate and Rhythm: Normal rate and regular rhythm.     Pulses: Normal pulses.     Heart sounds: S1 normal and S2 normal. No murmur heard.    No friction rub. Gallop present. S4 sounds present.  Pulmonary:     Effort: Pulmonary effort is normal. No respiratory distress.     Breath sounds: Normal breath sounds.  Abdominal:     General: Bowel sounds are normal.     Palpations: Abdomen is soft.     Tenderness: There is no abdominal tenderness. There is no guarding or rebound.     Hernia: No hernia is present.  Musculoskeletal:        General: No swelling.     Cervical back: Full passive range of motion without pain, normal range of motion and neck supple. No spinous process tenderness or muscular tenderness. Normal range of motion.     Right lower leg: Edema present.     Left lower leg: Edema present.  Skin:    General: Skin is warm and dry.     Capillary Refill: Capillary refill takes less than 2 seconds.     Findings: No ecchymosis, erythema, rash or wound.  Neurological:     General: No focal deficit present.     Mental Status: She is alert and oriented to person, place, and time.     GCS: GCS eye subscore is 4. GCS verbal subscore is 5. GCS motor subscore is 6.     Cranial Nerves: Cranial nerves 2-12 are intact.     Sensory: Sensation is intact.     Motor: Motor function is intact.     Coordination: Coordination is intact.  Psychiatric:        Attention and  Perception: Attention normal.        Mood and Affect: Mood normal.        Speech: Speech normal.        Behavior: Behavior normal.     ED Results / Procedures / Treatments   Labs (all labs ordered are listed, but only abnormal results are displayed) Labs Reviewed  BASIC METABOLIC PANEL - Abnormal; Notable for the following components:      Result Value   Creatinine, Ser 1.33 (*)    GFR, Estimated 38 (*)    All other components within normal limits  CBC - Abnormal; Notable for the following components:   RBC 3.55 (*)    Hemoglobin 11.3 (*)    HCT 35.3 (*)    RDW 15.8 (*)    All other components within normal limits  BRAIN NATRIURETIC PEPTIDE - Abnormal; Notable for the following components:   B Natriuretic Peptide 297.6 (*)    All other components within normal limits  TROPONIN I (HIGH SENSITIVITY)  TROPONIN I (HIGH SENSITIVITY)    EKG EKG Interpretation Date/Time:  Wednesday August 25 2023 18:46:31 EST Ventricular Rate:  65 PR Interval:    QRS Duration:  90 QT Interval:  444 QTC Calculation: 461 R Axis:   62  Text Interpretation: Sinus rhythm Premature atrial complexes Anterior infarct , age undetermined Abnormal ECG When compared with ECG of 13-Jul-2023 11:53, No significant change since last tracing Confirmed by Gilda Crease [phone]) on 08/26/2023 12:04:08 AM  Radiology DG Chest 2 View Result Date: 08/25/2023 CLINICAL DATA:  Chest pain, tightness, shortness of breath, and numbness in both arms and hands. EXAM: CHEST - 2 VIEW COMPARISON:  02/14/2022. FINDINGS: The heart size and mediastinal contours are stable. There is atherosclerotic calcification of the aorta. Interstitial prominence is present bilaterally. A stable cystic structure is noted in the posterior aspect of the mid right lung, unchanged from multiple prior exams. No consolidation, effusion, or pneumothorax is seen. No acute osseous abnormality is identified. Sternotomy wires are noted. IMPRESSION: No  active cardiopulmonary disease. Electronically Signed   By: Thornell Sartorius M.D.   On: 08/25/2023 20:57    Procedures Procedures    Medications Ordered in ED Medications - No data to display  ED Course/ Medical Decision Making/ A&P                                 Medical Decision Making Amount and/or Complexity of Data Reviewed Discussion of management or test interpretation with external provider(s): Echo 10/24/2022 EF 60-65%   Differential Diagnosis considered includes, but not limited to: STEMI; NSTEMI; myocarditis; pericarditis; pulmonary embolism; aortic dissection; pneumothorax; pneumonia; gastritis; musculoskeletal pain  Presents to the emergency department with complaints of shortness of breath, chest tightness.  Symptoms ongoing for 2 days.  She reports that she is improved here in the ED compared to what she was like at home.  Patient with an unchanged EKG.  Troponin negative x 2.  No evidence of ACS or ongoing ischemia.  Reviewing her record reveals that she does have a history of heart failure.  Patient admitted in March of 2023 for respiratory failure.  At that time she had likely viral URI and volume overload secondary to diastolic heart failure.  Reviewing records, she does not appear to be on diuretics at this time.  Blood pressure is elevated here in the ED.  She has lower extremity edema and an S4 gallop, suspect that there is some mild diastolic heart failure exacerbation causing her symptoms.  Will diurese in the outpatient setting, follow-up with cardiology.  Return if symptoms do not improve or worsen.        Final Clinical Impression(s) / ED Diagnoses Final diagnoses:  Chest pain, unspecified type  Acute on chronic diastolic congestive heart failure (HCC)  Rx / DC Orders ED Discharge Orders          Ordered    furosemide (LASIX) 20 MG tablet  Daily        08/26/23 0032    potassium chloride SA (KLOR-CON M) 20 MEQ tablet  Daily        08/26/23 0032     Ambulatory referral to Cardiology       Comments: If you have not heard from the Cardiology office within the next 72 hours please call [phone].   08/26/23 0033              Gilda Crease, MD 08/26/23 [phone]

## 2023-08-26 NOTE — Telephone Encounter (Signed)
Called and spoke to patient. Verified name and DOB. Patient report she was seen in the ED yesterday for chest pain. She states she felt like she was in Afib. She was discharged home on Lasix 20 mg X 3 days and Potassium 20 Meq for 3 days. Patient asked if she should pick up medication because she doesn't have any swelling in her leg except from sitting in the ED for 7 hours. Advised patient to pick up medications and take as directed and explain why the prescribed each medication. No further questions at this time.Follow up appt scheduled per ED note  for 2/3 with Bernadene Person, NP.

## 2023-08-30 ENCOUNTER — Encounter: Payer: Self-pay | Admitting: Nurse Practitioner

## 2023-08-30 ENCOUNTER — Other Ambulatory Visit: Payer: Self-pay | Admitting: Family Medicine

## 2023-08-30 ENCOUNTER — Ambulatory Visit: Payer: PPO | Attending: Nurse Practitioner | Admitting: Nurse Practitioner

## 2023-08-30 VITALS — BP 142/60 | HR 68 | Ht 61.0 in | Wt 114.0 lb

## 2023-08-30 DIAGNOSIS — E785 Hyperlipidemia, unspecified: Secondary | ICD-10-CM

## 2023-08-30 DIAGNOSIS — I5032 Chronic diastolic (congestive) heart failure: Secondary | ICD-10-CM

## 2023-08-30 DIAGNOSIS — I10 Essential (primary) hypertension: Secondary | ICD-10-CM

## 2023-08-30 DIAGNOSIS — I2721 Secondary pulmonary arterial hypertension: Secondary | ICD-10-CM | POA: Diagnosis not present

## 2023-08-30 DIAGNOSIS — R9431 Abnormal electrocardiogram [ECG] [EKG]: Secondary | ICD-10-CM

## 2023-08-30 DIAGNOSIS — N1832 Chronic kidney disease, stage 3b: Secondary | ICD-10-CM

## 2023-08-30 DIAGNOSIS — I25728 Atherosclerosis of autologous artery coronary artery bypass graft(s) with other forms of angina pectoris: Secondary | ICD-10-CM

## 2023-08-30 DIAGNOSIS — I480 Paroxysmal atrial fibrillation: Secondary | ICD-10-CM | POA: Diagnosis not present

## 2023-08-30 DIAGNOSIS — K219 Gastro-esophageal reflux disease without esophagitis: Secondary | ICD-10-CM

## 2023-08-30 MED ORDER — APIXABAN 2.5 MG PO TABS: 2.5000 mg | ORAL_TABLET | Freq: Two times a day (BID) | ORAL | 0 refills | Status: DC

## 2023-08-30 NOTE — Patient Instructions (Signed)
Medication Instructions:  Lasix 20 mg daily as needed   *If you need a refill on your cardiac medications before your next appointment, please call your pharmacy*   Lab Work: BMET today  Testing/Procedures: NONE ordered at this time of appointment   Follow-Up: At [facility], you and your health needs are our priority.  As part of our continuing mission to provide you with exceptional heart care, we have created designated Provider Care Teams.  These Care Teams include your primary Cardiologist (physician) and Advanced Practice Providers (APPs -  Physician Assistants and Nurse Practitioners) who all work together to provide you with the care you need, when you need it.  We recommend signing up for the patient portal called "MyChart".  Sign up information is provided on this After Visit Summary.  MyChart is used to connect with patients for Virtual Visits (Telemedicine).  Patients are able to view lab/test results, encounter notes, upcoming appointments, etc.  Non-urgent messages can be sent to your provider as well.   To learn more about what you can do with MyChart, go to [email or web address].    Your next appointment:   June 2025   Provider:   Thurmon Fair, MD     Other Instructions Monitor blood pressure. Report blood pressure consistently greater than 140/80

## 2023-08-30 NOTE — Progress Notes (Signed)
Office Visit    Patient Name: Jacqueline [name] Date of Encounter: 08/30/2023  Primary Care Provider:  Caro Laroche, DO Primary Cardiologist:  Thurmon Fair, MD  Chief Complaint    89 year old female with a history of CAD s/p CABG x 2 (LIMA-LAD, SVG-diagonal), paroxysmal atrial fibrillation, atrial flutter, prolonged QT interval, chronic diastolic heart failure, pulmonary hypertension, hypertension, orthostatic hypotension, hyperlipidemia, and CKD who presents for hospital follow-up related to chest pain.  Past Medical History    Past Medical History:  Diagnosis Date   Abnormal ECG 10/22/2021   Arthritis    back   Atrial fibrillation with rapid ventricular response (HCC) 02/24/2014   a. CHA2DS2VASc = 6 -> on eliquis;  b. 02/2014 s/p DCCV;  c. 08/2014 Echo: EF 55-60%, Gr 2 DD, mild MR, triv AI.   Bronchitis 10/22/2021   CAD (coronary artery disease)    a. s/p CABG x 2 (LIMA->LAD, VG->Diag);  b. 08/2014 Cath: LM nl, LAD 90p, LCX nl, RCA nl/dominant, LIMA->LAD atretic, VG->D2 patent w/ retrograde filling of LAD, EF 55-60%-->Med Rx.   Diverticulitis 02/21/2012   Elevated troponin 10/22/2021   GERD (gastroesophageal reflux disease)    Hyperlipemia    Hypertension    Insomnia    Vertigo 11/24/2018   Past Surgical History:  Procedure Laterality Date   ABDOMINAL HYSTERECTOMY  1975   CARDIAC CATHETERIZATION N/A 05/09/2015   Procedure: Right Heart Cath;  Surgeon: Laurey Morale, MD;  Location: [facility] INVASIVE CV LAB;  Service: Cardiovascular;  Laterality: N/A;   CARDIOVERSION N/A 02/28/2014   Procedure: CARDIOVERSION;  Surgeon: Thurmon Fair, MD;  Location: [facility] ENDOSCOPY;  Service: Cardiovascular;  Laterality: N/A;   CARDIOVERSION N/A 01/30/2019   Procedure: CARDIOVERSION;  Surgeon: Pricilla Riffle, MD;  Location: [facility] ENDOSCOPY;  Service: Cardiovascular;  Laterality: N/A;   CATARACT EXTRACTION Bilateral 2014   with lens implanted   COLONOSCOPY N/A 02/19/2015   Procedure: COLONOSCOPY;   Surgeon: Meryl Dare, MD;  Location: [facility] ENDOSCOPY;  Service: Endoscopy;  Laterality: N/A;   CORONARY ARTERY BYPASS GRAFT  2006   off-pump bypass surgery with LIMA to the LAD and SVG to the second diagonal artery ( performed by Dr Tyrone Sage)    FEMUR IM NAIL Right 07/18/2022   Procedure: INTRAMEDULLARY (IM) NAIL, HIP;  Surgeon: Sheral Apley, MD;  Location: [facility] OR;  Service: Orthopedics;  Laterality: Right;   JOINT REPLACEMENT Bilateral    L-2004, R-2006   LEFT HEART CATHETERIZATION WITH CORONARY/GRAFT ANGIOGRAM N/A 09/17/2014   Procedure: LEFT HEART CATHETERIZATION WITH Isabel Caprice;  Surgeon: Lennette Bihari, MD;  Location: [facility] CATH LAB;  Service: Cardiovascular;  Laterality: N/A;   REIMPLANTATION OF TOTAL KNEE Right 10/08/2014   Procedure: REIMPLANTATION OF RIGHT TOTAL KNEE ARTHROPLASTY WITH REMOVAL OF ANTIBIOTIC SPACER;  Surgeon: Durene Romans, MD;  Location: [facility] ORS;  Service: Orthopedics;  Laterality: Right;   ROTATOR CUFF REPAIR Bilateral    r-1999, l- 2005   TEE WITHOUT CARDIOVERSION N/A 02/28/2014   Procedure: TRANSESOPHAGEAL ECHOCARDIOGRAM (TEE);  Surgeon: Thurmon Fair, MD;  Location: [facility] ENDOSCOPY;  Service: Cardiovascular;  Laterality: N/A;   TEE WITHOUT CARDIOVERSION N/A 01/30/2019   Procedure: TRANSESOPHAGEAL ECHOCARDIOGRAM (TEE);  Surgeon: Pricilla Riffle, MD;  Location: [facility] ENDOSCOPY;  Service: Cardiovascular;  Laterality: N/A;   TOTAL KNEE ARTHROPLASTY Right 07/02/2014   Procedure: Resection of Infected Right Total Knee Arthroplasty with placement antibiotic spacer;  Surgeon: Shelda Pal, MD;  Location: [facility] ORS;  Service: Orthopedics;  Laterality: Right;  Allergies  Allergies  Allergen Reactions   Atorvastatin Other (See Comments)    Myalgias in legs   Beta Adrenergic Blockers Other (See Comments)    Severe bradycardia   Crestor [Rosuvastatin] Other (See Comments)    Myalgias in legs   Morphine And Codeine Other (See Comments)    "Crazy thoughts, severe  headache, sick"   Prednisone     On two occasions has caused A fib     Labs/Other Studies Reviewed    The following studies were reviewed today:  Cardiac Studies & Procedures   CARDIAC CATHETERIZATION  CARDIAC CATHETERIZATION 05/09/2015  Narrative 1. Elevated left and right heart filling pressures with prominent v-waves in the PCWP tracing. 2. Mixed pulmonary hypertension, I suspect primarily pulmonary venous hypertension but possible pulmonary arterial hypertension component with PVR 3.7 WU.  I think that the primary problem here is diastolic CHF.  Creatinine is better today, needs diuresis.  Will write for Lasix 60 mg IV bid.   STRESS TESTS  MYOCARDIAL PERFUSION IMAGING 08/31/2017  Narrative  Nuclear stress EF: 62%.  The left ventricular ejection fraction is normal (55-65%).  There was no ST segment deviation noted during stress.  The study is normal.  This is a low risk study.  Normal pharmacologic nuclear stress test with no evidence for prior infarct or ischemia. Normal LVEF.  ECHOCARDIOGRAM  ECHOCARDIOGRAM COMPLETE 10/23/2021  Narrative ECHOCARDIOGRAM REPORT    Patient Name:   Jacqueline [name] [facility] Date of Exam: 10/23/2021 Medical Rec #:  [id number]        Height:       61.0 in Accession #:    [id number]       Weight:       116.6 lb Date of Birth:  [birth year]        BSA:          1.502 m Patient Age:    89 years         BP:           129/68 mmHg Patient Gender: F                HR:           84 bpm. Exam Location:  Inpatient  Procedure: 2D Echo, Cardiac Doppler and Color Doppler  Indications:    I50.40* Unspecified combined systolic (congestive) and diastolic (congestive) heart failure  History:        Patient has prior history of Echocardiogram examinations, most recent 04/23/2020. CHF, CAD, Abnormal ECG and Prior CABG, Signs/Symptoms:Chest Pain; Risk Factors:Hypertension and Dyslipidemia.  Sonographer:    Sheralyn Boatman RDCS Referring Phys: [id number] ANASTASSIA  DOUTOVA   Sonographer Comments: Technically difficult study due to poor echo windows. IMPRESSIONS   1. Left ventricular ejection fraction, by estimation, is 60 to 65%. The left ventricle has normal function. The left ventricle has no regional wall motion abnormalities. There is mild left ventricular hypertrophy. Left ventricular diastolic parameters are indeterminate. 2. Right ventricular systolic function is normal. The right ventricular size is normal. 3. The mitral valve is normal in structure. Trivial mitral valve regurgitation. No evidence of mitral stenosis. 4. The aortic valve is tricuspid. Aortic valve regurgitation is trivial. Aortic valve sclerosis is present, with no evidence of aortic valve stenosis. 5. The inferior vena cava is normal in size with greater than 50% respiratory variability, suggesting right atrial pressure of 3 mmHg.  FINDINGS Left Ventricle: Left ventricular ejection fraction, by estimation, is 60 to 65%. The left ventricle has  normal function. The left ventricle has no regional wall motion abnormalities. The left ventricular internal cavity size was normal in size. There is mild left ventricular hypertrophy. Left ventricular diastolic parameters are indeterminate.  Right Ventricle: The right ventricular size is normal. Right ventricular systolic function is normal.  Left Atrium: Left atrial size was normal in size.  Right Atrium: Right atrial size was normal in size.  Pericardium: There is no evidence of pericardial effusion.  Mitral Valve: The mitral valve is normal in structure. Mild mitral annular calcification. Trivial mitral valve regurgitation. No evidence of mitral valve stenosis.  Tricuspid Valve: The tricuspid valve is normal in structure. Tricuspid valve regurgitation is trivial. No evidence of tricuspid stenosis.  Aortic Valve: The aortic valve is tricuspid. Aortic valve regurgitation is trivial. Aortic valve sclerosis is present, with no  evidence of aortic valve stenosis.  Pulmonic Valve: The pulmonic valve was normal in structure. Pulmonic valve regurgitation is not visualized. No evidence of pulmonic stenosis.  Aorta: The aortic root is normal in size and structure.  Venous: The inferior vena cava is normal in size with greater than 50% respiratory variability, suggesting right atrial pressure of 3 mmHg.  IAS/Shunts: No atrial level shunt detected by color flow Doppler.   LEFT VENTRICLE PLAX 2D LVIDd:         4.10 cm LVIDs:         2.60 cm LV PW:         1.00 cm LV IVS:        1.10 cm LVOT diam:     1.80 cm LV SV:         48 LV SV Index:   32 LVOT Area:     2.54 cm  LV Volumes (MOD) LV vol d, MOD A2C: 38.0 ml LV vol d, MOD A4C: 33.3 ml LV vol s, MOD A2C: 12.4 ml LV vol s, MOD A4C: 11.4 ml LV SV MOD A2C:     25.6 ml LV SV MOD A4C:     33.3 ml LV SV MOD BP:      25.2 ml  RIGHT VENTRICLE             IVC RV S prime:     10.85 cm/s  IVC diam: 2.00 cm TAPSE (M-mode): 1.2 cm  LEFT ATRIUM             Index        RIGHT ATRIUM          Index LA diam:        3.00 cm 2.00 cm/m   RA Area:     7.06 cm LA Vol (A2C):   48.7 ml 32.43 ml/m  RA Volume:   9.51 ml  6.33 ml/m LA Vol (A4C):   33.2 ml 22.11 ml/m LA Biplane Vol: 43.2 ml 28.76 ml/m AORTIC VALVE LVOT Vmax:   110.00 cm/s LVOT Vmean:  68.600 cm/s LVOT VTI:    0.190 m  AORTA Ao Root diam: 2.80 cm Ao Asc diam:  3.20 cm  MITRAL VALVE MV Area (PHT): 3.85 cm     SHUNTS MV Decel Time: 197 msec     Systemic VTI:  0.19 m MV E velocity: 104.50 cm/s  Systemic Diam: 1.80 cm MV A velocity: 63.00 cm/s MV E/A ratio:  1.66  Olga Millers MD Electronically signed by Olga Millers MD Signature Date/Time: 10/23/2021/3:37:58 PM    Final  TEE  ECHO TEE 01/30/2019  Narrative TRANSESOPHOGEAL ECHO REPORT    Patient  Name:   Jacqueline [name] Lariviere Date of Exam: 01/30/2019 Medical Rec #:  [id number]     Height:       61.0 in Accession #:    [id number]    Weight:        132.9 lb Date of Birth:  Nov 08, 1930     BSA:          1.59 m Patient Age:    88 years      BP:           109/52 mmHg Patient Gender: F             HR:           109 bpm. Exam Location:  Inpatient   Procedure: 2D Echo, Cardiac Doppler and Color Doppler  Indications:     Atrial Fibrillation  History:         Patient has prior history of Echocardiogram examinations, most recent 07/22/2017. Atrial Fibrillation.  Sonographer:     Belva Chimes RDCS (AE) Referring Phys:  [id number] Beatriz Stallion Diagnosing Phys: Dietrich Pates MD    PROCEDURE: After discussion of the risks and benefits of a TEE, an informed consent was obtained from the patient. The transesophogeal probe was passed through the esophogus of the patient. Image quality was good. The patient developed no complications during the procedure.  IMPRESSIONS   1. LVEF is mildly depressed. 2. LA, LAA without masses. No PFO by color doppler. 3. MR is at least moderate and is centrally directed. 4. The aortic valve is tricuspid Mild thickening of the aortic valve. Aortic valve regurgitation is trivial by color flow Doppler. 5. There is evidence of plaque in the descending aorta.  FINDINGS Left Ventricle: LVEF is mildly depressed.  Left Atrium: LA, LAA without masses. No PFO by color doppler.   Interatrial Septum: Saline contrast bubble study was negative, with no evidence of any interatrial shunt.  Mitral Valve: The mitral valve is normal in structure. MR is at least moderate and is centrally directed.  Tricuspid Valve: The tricuspid valve was normal in structure. Tricuspid valve regurgitation is trivial by color flow Doppler.  Aortic Valve: The aortic valve is tricuspid Mild thickening of the aortic valve. Aortic valve regurgitation is trivial by color flow Doppler.  Pulmonic Valve: The pulmonic valve was grossly normal.  Aorta: There is evidence of plaque in the descending aorta.   Dietrich Pates MD Electronically  signed by Dietrich Pates MD Signature Date/Time: 01/30/2019/8:23:25 PM    Final  MONITORS  LONG TERM MONITOR (3-14 DAYS) 03/14/2019  Narrative  Baseline rhythm is normal sinus rhythm with normal circadian variation.  There are rare episodes of brief ectopic atrial rhythm (maximum 9 beats).  There is no atrial fibrillation and no meaningful ventricular arrhythmia.  There is no correlation between the arrhythmia and symptoms. During most symptomatic events the recording shows normal sinus rhythm.  Mildly abnormal event monitor due to infrequent and brief ectopic atrial rhythm. Atrial fibrillation is not seen.          Recent Labs: 07/13/2023: ALT 30; TSH 1.820 08/25/2023: B Natriuretic Peptide 297.6; BUN 18; Creatinine, Ser 1.33; Hemoglobin 11.3; Platelets 217; Potassium 3.9; Sodium 140  Recent Lipid Panel    Component Value Date/Time   CHOL 153 01/18/2023 1157   TRIG 147 01/18/2023 1157   HDL 50 01/18/2023 1157   CHOLHDL 3.1 01/18/2023 1157   CHOLHDL 2.8 04/08/2016 1153   VLDL 26 04/08/2016 1153   LDLCALC 78 01/18/2023 1157  History of Present Illness    89 year old female with the above past medical history including CAD s/p CABG x 2 (LIMA-LAD, SVG-diagonal), paroxysmal atrial fibrillation, atrial flutter, prolonged QT interval, chronic diastolic heart failure, pulmonary hypertension, hypertension, orthostatic hypotension, hyperlipidemia, and CKD.  She has a history of CAD s/p CABG x 2 in 2006.  Lexiscan Myoview in 2019 was negative for ischemia.  Patiently, she has a history of paroxysmal atrial fibrillation.  She had a prolonged episode of atrial fibrillation in 2020 s/p DCCV in July 2020.  She had a transient episode of atrial fibrillation in October 2021 in the setting of UTI.  She has been maintained on amiodarone and Eliquis.  Most recent echocardiogram in 2023 showed EF 60 to 65%, normal LV function, no RWMA, mild LVH, normal LV systolic function, no significant valvular  abnormalities.   She was last seen in the office on 07/13/2023 and was stable from a cardiac standpoint.  She was evaluated in the ED on 08/26/2023 in the setting of chest pain, shortness of breath, leg pain x 2 days.  Troponin was negative x 2, EKG was unchanged from prior.  BNP was elevated.  She was noted to have bilateral lower extremity edema, S4 gallop, she was started on Lasix and advised to follow-up with cardiology as an outpatient.  She presents today for follow-up accompanied by her daughter-in-law. Since her last visit and since her recent ED visit she has been stable from a cardiac standpoint.  She denies any chest pain. She does report significant neck pain that is radiates into her R arm and upper chest.  She denies any exertional chest discomfort or exertional dyspnea.  She has noticed some mild orthopnea, this has improved since her recent ED visit.  She denies palpitations, dizziness, presyncope, syncope, edema, PND, weight gain.  Home Medications    Current Outpatient Medications  Medication Sig Dispense Refill   acetaminophen (TYLENOL) 325 MG tablet Take 2 tablets (650 mg total) by mouth every 6 (six) hours. 30 tablet 0   ALPRAZolam (XANAX) 0.5 MG tablet TAKE 1 TABLET BY MOUTH AT BEDTIME AS NEEDED FOR ANXIETY 90 tablet 0   amiodarone (PACERONE) 200 MG tablet TAKE 1 TABLET BY MOUTH EVERY DAY 90 tablet 1   amLODipine (NORVASC) 5 MG tablet TAKE 1 TABLET (5 MG TOTAL) BY MOUTH DAILY. 90 tablet 3   apixaban (ELIQUIS) 2.5 MG TABS tablet Take 1 tablet (2.5 mg total) by mouth 2 (two) times daily. 28 tablet 0   cholecalciferol (VITAMIN D3) 25 MCG (1000 UNIT) tablet Take 1,000 Units by mouth daily.     cycloSPORINE (RESTASIS) 0.05 % ophthalmic emulsion Place 1 drop into both eyes 2 (two) times daily.     gabapentin (NEURONTIN) 100 MG capsule TAKE 1 CAPSULE (100 MG TOTAL) BY MOUTH 3 TIMES A DAY 270 capsule 3   hydrALAZINE (APRESOLINE) 25 MG tablet Take 1 tablet (25 mg total) by mouth 3 (three)  times daily as needed (may take up to three times daily for BP >160). 270 tablet 3   levothyroxine (SYNTHROID) 75 MCG tablet Take 1 tablet (75 mcg total) by mouth daily. 90 tablet 3   Multiple Vitamin (MULTIVITAMIN WITH MINERALS) TABS tablet Take 1 tablet by mouth daily.     nitroGLYCERIN (NITROSTAT) 0.4 MG SL tablet Place 1 tablet (0.4 mg total) under the tongue every 5 (five) minutes as needed for chest pain. 25 tablet 2   Omega-3 Fatty Acids (FISH OIL) 1000 MG CAPS Take 1,000  mg by mouth every evening.     omeprazole (PRILOSEC) 20 MG capsule Take 1 capsule (20 mg total) by mouth daily. 90 capsule 1   Polyethyl Glycol-Propyl Glycol (LUBRICANT EYE DROPS) 0.4-0.3 % SOLN Place 1 drop into both eyes 4 (four) times daily.     polyethylene glycol (MIRALAX / GLYCOLAX) 17 g packet Take 17 g by mouth 2 (two) times daily. (Patient taking differently: Take 17 g by mouth daily as needed for mild constipation.) 14 each 0   pravastatin (PRAVACHOL) 40 MG tablet TAKE 1 TABLET BY MOUTH EVERY DAY IN THE EVENING 90 tablet 0   rOPINIRole (REQUIP) 0.25 MG tablet TAKE 1 TABLET BY MOUTH 3 TIMES DAILY. 270 tablet 1   senna-docusate (SENOKOT-S) 8.6-50 MG tablet Take 2 tablets by mouth 2 (two) times daily. (Patient taking differently: Take 2 tablets by mouth at bedtime as needed for mild constipation.)     apixaban (ELIQUIS) 2.5 MG TABS tablet Take 1 tablet (2.5 mg total) by mouth 2 (two) times daily. 28 tablet 0   ELIQUIS 2.5 MG TABS tablet TAKE 1 TABLET BY MOUTH TWICE A DAY 180 tablet 1   furosemide (LASIX) 20 MG tablet Take 1 tablet (20 mg total) by mouth daily. (Patient not taking: Reported on 08/30/2023) 3 tablet 0   potassium chloride SA (KLOR-CON M) 20 MEQ tablet Take 1 tablet (20 mEq total) by mouth daily. (Patient not taking: Reported on 08/30/2023) 3 tablet 0   No current facility-administered medications for this visit.     Review of Systems    She denies chest pain, palpitations, dyspnea, pnd, n, v, dizziness,  syncope, edema, weight gain, or early satiety. All other systems reviewed and are otherwise negative except as noted above.   Physical Exam    VS:  BP (!) 142/60   Pulse 68   Ht 5\' 1"  (1.549 m)   Wt 114 lb (51.7 kg)   SpO2 93%   BMI 21.54 kg/m   GEN: Well nourished, well developed, in no acute distress. HEENT: normal. Neck: Supple, no JVD, carotid bruits, or masses. Cardiac: RRR, no murmurs, rubs, or gallops. No clubbing, cyanosis, edema.  Radials/DP/PT 2+ and equal bilaterally.  Respiratory:  Respirations regular and unlabored, clear to auscultation bilaterally. GI: Soft, nontender, nondistended, BS + x 4. MS: no deformity or atrophy. Skin: warm and dry, no rash. Neuro:  Strength and sensation are intact. Psych: Normal affect.  Accessory Clinical Findings    ECG personally reviewed by me today - EKG Interpretation Date/Time:  Monday August 30 2023 14:51:43 EST Ventricular Rate:  68 PR Interval:  174 QRS Duration:  90 QT Interval:  426 QTC Calculation: 452 R Axis:   36  Text Interpretation: Sinus rhythm with Premature atrial complexes Cannot rule out Anterior infarct (cited on or before 25-Aug-2023) No significant change since last tracing Confirmed by Bernadene Person ([id number]) on 08/30/2023 3:02:07 PM  - no acute changes.   Lab Results  Component Value Date   WBC 4.3 08/25/2023   HGB 11.3 (L) 08/25/2023   HCT 35.3 (L) 08/25/2023   MCV 99.4 08/25/2023   PLT 217 08/25/2023   Lab Results  Component Value Date   CREATININE 1.33 (H) 08/25/2023   BUN 18 08/25/2023   NA 140 08/25/2023   K 3.9 08/25/2023   CL 107 08/25/2023   CO2 23 08/25/2023   Lab Results  Component Value Date   ALT 30 07/13/2023   AST 43 (H) 07/13/2023   ALKPHOS 104  07/13/2023   BILITOT 0.3 07/13/2023   Lab Results  Component Value Date   CHOL 153 01/18/2023   HDL 50 01/18/2023   LDLCALC 78 01/18/2023   TRIG 147 01/18/2023   CHOLHDL 3.1 01/18/2023    Lab Results  Component Value Date    HGBA1C 5.8 (H) 07/23/2017    Assessment & Plan    1. Chronic diastolic heart failure: Most recent echocardiogram in 2023 showed EF 60 to 65%, normal LV function, no RWMA, mild LVH, normal LV systolic function, no significant valvular abnormalities.  In ED visit with chest pain, shortness of breath, extremity edema, BNP was elevated.  She was given Lasix 20 mg daily x 3 days.  Generally euvolemic and well compensated on exam, weight is down. She does note some mild orthopnea, overall improved.  Will have her take Lasix 20 mg daily as needed for swelling, weight gain.  Will check BMET today.  We discussed possible repeat echocardiogram, however, she declines at this time.  2. CAD: S/p CABG x 2 in 2006.  Lexiscan Myoview in 2019 was negative for ischemia.  Recent ED visit with chest discomfort, felt to be in the setting of possible fluid volume overload, improved with low dose Lasix.  She reports significant neck pain that is radiates into her R arm and upper chest.  She denies any exertional chest discomfort or exertional dyspnea.  She is unable to fully turn her neck to the side.Her pain improves with massage. Her pain in her neck and arms appears to be musculoskeletal, question cervical radiculopathy.  Recommend follow-up with PCP.  No ASA in the setting of chronic DOAC therapy continue amlodipine, hydralazine, pravastatin.  3. Paroxysmal atrial fibrillation/atrial flutter: Maintaining sinus rhythm, denies any recent palpitations.  Labs in 06/2023 including TSH and CMET were stable.  For repeat labs for monitoring for amiodarone therapy in June 2025.  Continue amiodarone, Eliquis.  4. History of prolonged QT interval: Stable on amiodarone. Occurred in the setting of concomitant treatment with azithromycin.  Avoid QT prolonging medications including macrolides.  5. PAH: General Asymptomatic.  Likely secondary to left heart diastolic failure.  6. Hypertension: BP generally well controlled (BP less than  140/90 given advanced age). Continue current antihypertensive regimen.   7. Hyperlipidemia: LDL was 78 in 12/2022.  Continue pravastatin.  8. CKD: Creatinine was 1.33 on 08/25/2023.  Repeat BMET pending as above given recent Lasix use.  9. Disposition: Follow-up with Dr. Royann Shivers in June 2025.  Joylene Grapes, NP 08/30/2023, 3:14 PM

## 2023-08-31 LAB — BASIC METABOLIC PANEL
BUN/Creatinine Ratio: 15 (ref 12–28)
BUN: 25 mg/dL (ref 10–36)
CO2: 22 mmol/L (ref 20–29)
Calcium: 9.3 mg/dL (ref 8.7–10.3)
Chloride: 104 mmol/L (ref 96–106)
Creatinine, Ser: 1.65 mg/dL — ABNORMAL HIGH (ref 0.57–1.00)
Glucose: 90 mg/dL (ref 70–99)
Potassium: 5.1 mmol/L (ref 3.5–5.2)
Sodium: 140 mmol/L (ref 134–144)
eGFR: 29 mL/min/{1.73_m2} — ABNORMAL LOW (ref >59)

## 2023-08-31 NOTE — Telephone Encounter (Signed)
 Patient has been seen.

## 2023-09-01 ENCOUNTER — Telehealth: Payer: Self-pay | Admitting: Nurse Practitioner

## 2023-09-01 MED ORDER — FUROSEMIDE 20 MG PO TABS: 20.0000 mg | ORAL_TABLET | Freq: Every day | ORAL | 3 refills | Status: DC

## 2023-09-01 NOTE — Telephone Encounter (Signed)
*  STAT* If patient is at the pharmacy, call can be transferred to refill team.   1. Which medications need to be refilled? (please list name of each medication and dose if known) furosemide  (LASIX ) 20 MG tablet   2. Which pharmacy/location (including street and city if local pharmacy) is medication to be sent to?  [id number] [address], [place] - [address]   3. Do they need a 30 day or 90 day supply? 90

## 2023-09-01 NOTE — Telephone Encounter (Signed)
 Pt's medication was sent to pt's pharmacy as requested. Confirmation received.

## 2023-09-03 ENCOUNTER — Other Ambulatory Visit: Payer: Self-pay | Admitting: *Deleted

## 2023-09-03 ENCOUNTER — Other Ambulatory Visit: Payer: Self-pay

## 2023-09-03 ENCOUNTER — Telehealth: Payer: Self-pay

## 2023-09-03 DIAGNOSIS — Z79899 Other long term (current) drug therapy: Secondary | ICD-10-CM

## 2023-09-03 DIAGNOSIS — I10 Essential (primary) hypertension: Secondary | ICD-10-CM

## 2023-09-03 NOTE — Telephone Encounter (Signed)
 Spoke with pt. Pt was notified of lab results. Pt will repeat lab work in 2-4 weeks (BMET)

## 2023-09-03 NOTE — Patient Outreach (Signed)
 Care Management   Visit Note  09/03/2023 Name: Jacqueline [name] MRN: [id number] DOB: Jan 22, 1931  Subjective: Jacqueline [name] is a 89 y.o. year old female who is a primary care patient of Rumball, Alison M, DO. The Care Management team was consulted for assistance.      Engaged with patient spoke with patient by telephone.    Goals Addressed             This Visit's Progress    RNCM Care Management Exepected Outcome: Monitor, Self-Manage and Reduce Symptoms: HTN, AFIB       Current Barriers:  Knowledge Deficits related to plan of care for management of Atrial Fibrillation and HTN  Chronic Disease Management support and education needs related to Atrial Fibrillation and HTN   RNCM Clinical Goal(s):  Patient will verbalize basic understanding of  Atrial Fibrillation and HTN disease process and self health management plan as evidenced by verbal explanation, recognizing symptoms, lifestyle modifications take all medications exactly as prescribed and will call provider for medication related questions as evidenced by being compliant with all medications attend all scheduled medical appointments: primary care provider and specialist as evidenced by maintaining all scheduled appointments continue to work with RN Care Manager to address care management and care coordination needs related to  Atrial Fibrillation and HTN as evidenced by adherence to CM Team Scheduled appointments through collaboration with RN Care manager, provider, and care team.   Interventions: Evaluation of current treatment plan related to  self management and patient's adherence to plan as established by provider   AFIB Interventions: (Status:  Condition stable.  Not addressed this visit.) Long Term Goal Counseled on increased risk of stroke due to Afib and benefits of anticoagulation for stroke prevention Reviewed importance of adherence to anticoagulant exactly as prescribed. Reports compliance with  mediation Counseled on bleeding risk associated with taking Eliquis  and importance of self-monitoring for signs/symptoms of bleeding. Denies any bleeding at this time. Counseled on avoidance of NSAIDs due to increased bleeding risk with anticoagulants Counseled on importance of regular laboratory monitoring as prescribed Counseled on seeking medical attention after a head injury or if there is blood in the urine/stool Screening for signs and symptoms of depression related to chronic disease state  Recent ED visit where patient thought she may have been in AFIB. Per chart, patient was give Lasix  and advised to continue and will have follow up labs in 2-4 weeks.   Hypertension Interventions:  (Status:  Goal on track:  Yes.) Long Term Goal Last practice recorded BP readings:  BP Readings from Last 3 Encounters:  08/30/23 (!) 142/60  08/26/23 (!) 157/62  07/13/23 (!) 140/70  Self Reported: 142/80 Hr 63 on 07-16-2023 Most recent eGFR/CrCl:  Lab Results  Component Value Date   EGFR 29 (L) 08/30/2023    No components found for: CRCL  Evaluation of current treatment plan related to hypertension self management and patient's adherence to plan as established by provider. Patient takes blood pressure daily and keeps log. No complaints of headaches, chest pain, or swelling in her extremities at this time. Reports BP this morning 140/86. Provided education to patient re: stroke prevention, s/s of heart attack and stroke. Education and support provided. Reviewed medications with patient and discussed importance of compliance. Patient compliant with all medications. Counseled on adverse effects of illicit drug and excessive alcohol  use in patients with high blood pressure  Counseled on the importance of exercise goals with target of 150 minutes per week Discussed plans  with patient for ongoing care management follow up and provided patient with direct contact information for care management  team Advised patient, providing education and rationale, to monitor blood pressure daily and record, calling PCP for findings outside established parameters Reviewed scheduled/upcoming provider appointments including: 12-29-2023 with Cardiology Discussed complications of poorly controlled blood pressure such as heart disease, stroke, circulatory complications, vision complications, kidney impairment, sexual dysfunction  Patient Goals/Self-Care Activities: Take all medications as prescribed Attend all scheduled provider appointments Call pharmacy for medication refills 3-7 days in advance of running out of medications Perform all self care activities independently  Call provider office for new concerns or questions   Follow Up Plan:  Telephone follow up appointment with care management team member scheduled for:  10-06-2023 at 1:45 pm          Consent to Services:  Patient was given information about care management services, agreed to services, and gave verbal consent to participate.   Plan: Telephone follow up appointment with care management team member scheduled for:10-06-2023 at 1:45 pm  Rosina Sicard, BSN RN [facility], [facility] RN Care Manager Direct Dial: [phone]  Fax: [phone]

## 2023-09-03 NOTE — Patient Instructions (Signed)
 Visit Information  Thank you for taking time to visit with me today. Please don't hesitate to contact me if I can be of assistance to you before our next scheduled telephone appointment.  Following are the goals we discussed today:   Goals Addressed             This Visit's Progress    RNCM Care Management Exepected Outcome: Monitor, Self-Manage and Reduce Symptoms: HTN, AFIB       Current Barriers:  Knowledge Deficits related to plan of care for management of Atrial Fibrillation and HTN  Chronic Disease Management support and education needs related to Atrial Fibrillation and HTN   RNCM Clinical Goal(s):  Patient will verbalize basic understanding of  Atrial Fibrillation and HTN disease process and self health management plan as evidenced by verbal explanation, recognizing symptoms, lifestyle modifications take all medications exactly as prescribed and will call provider for medication related questions as evidenced by being compliant with all medications attend all scheduled medical appointments: primary care provider and specialist as evidenced by maintaining all scheduled appointments continue to work with RN Care Manager to address care management and care coordination needs related to  Atrial Fibrillation and HTN as evidenced by adherence to CM Team Scheduled appointments through collaboration with RN Care manager, provider, and care team.   Interventions: Evaluation of current treatment plan related to  self management and patient's adherence to plan as established by provider   AFIB Interventions: (Status:  Condition stable.  Not addressed this visit.) Long Term Goal Counseled on increased risk of stroke due to Afib and benefits of anticoagulation for stroke prevention Reviewed importance of adherence to anticoagulant exactly as prescribed. Reports compliance with mediation Counseled on bleeding risk associated with taking Eliquis  and importance of self-monitoring for  signs/symptoms of bleeding. Denies any bleeding at this time. Counseled on avoidance of NSAIDs due to increased bleeding risk with anticoagulants Counseled on importance of regular laboratory monitoring as prescribed Counseled on seeking medical attention after a head injury or if there is blood in the urine/stool Screening for signs and symptoms of depression related to chronic disease state  Recent ED visit where patient thought she may have been in AFIB. Per chart, patient was give Lasix  and advised to continue and will have follow up labs in 2-4 weeks.   Hypertension Interventions:  (Status:  Goal on track:  Yes.) Long Term Goal Last practice recorded BP readings:  BP Readings from Last 3 Encounters:  08/30/23 (!) 142/60  08/26/23 (!) 157/62  07/13/23 (!) 140/70  Self Reported: 142/80 Hr 63 on 07-16-2023 Most recent eGFR/CrCl:  Lab Results  Component Value Date   EGFR 29 (L) 08/30/2023    No components found for: CRCL  Evaluation of current treatment plan related to hypertension self management and patient's adherence to plan as established by provider. Patient takes blood pressure daily and keeps log. No complaints of headaches, chest pain, or swelling in her extremities at this time. Reports BP this morning 140/86. Provided education to patient re: stroke prevention, s/s of heart attack and stroke. Education and support provided. Reviewed medications with patient and discussed importance of compliance. Patient compliant with all medications. Counseled on adverse effects of illicit drug and excessive alcohol  use in patients with high blood pressure  Counseled on the importance of exercise goals with target of 150 minutes per week Discussed plans with patient for ongoing care management follow up and provided patient with direct contact information for care management team Advised  patient, providing education and rationale, to monitor blood pressure daily and record, calling PCP for  findings outside established parameters Reviewed scheduled/upcoming provider appointments including: 12-29-2023 with Cardiology Discussed complications of poorly controlled blood pressure such as heart disease, stroke, circulatory complications, vision complications, kidney impairment, sexual dysfunction  Patient Goals/Self-Care Activities: Take all medications as prescribed Attend all scheduled provider appointments Call pharmacy for medication refills 3-7 days in advance of running out of medications Perform all self care activities independently  Call provider office for new concerns or questions   Follow Up Plan:  Telephone follow up appointment with care management team member scheduled for:  10-06-2023 at 1:45 pm           Our next appointment is by telephone on 10-06-2023 at 1:45 pm  Please call the care guide team at [phone] if you need to cancel or reschedule your appointment.   If you are experiencing a Mental Health or Behavioral Health Crisis or need someone to talk to, please call the Suicide and Crisis Lifeline: 988 call the [place]  National Suicide Prevention Lifeline: 815-048-8547 or TTY: 725 261 8338 TTY 3316289498) to talk to a trained counselor call 1-800-273-TALK (toll free, 24 hour hotline) go to [facility] [address], [place] [phone])   Patient verbalizes understanding of instructions and care plan provided today and agrees to view in MyChart. Active MyChart status and patient understanding of how to access instructions and care plan via MyChart confirmed with patient.     Telephone follow up appointment with care management team member scheduled for:10-06-2023 at 1:45 pm  Rosina Sicard, BSN RN [facility], [facility] RN Care Manager Direct Dial: [phone]  Fax: [phone]

## 2023-09-28 ENCOUNTER — Telehealth: Payer: Self-pay

## 2023-09-28 NOTE — Telephone Encounter (Signed)
 Patient scheduled for virtual visit tomorrow with Dr. Perley Jain.   Discussed supportive measures in the meantime.   Veronda Prude, RN

## 2023-09-28 NOTE — Telephone Encounter (Signed)
 Patient calls nurse line reporting she tested positive for Covid.  She is requesting to pick up the antiviral tonight if possible.   She reports she would like to start treatment and pick up prescription before the "storms" tomorrow.   She plans to continue with virtual visit.   Advised will forward to PCP.

## 2023-09-28 NOTE — Telephone Encounter (Addendum)
 Patient calls nurse line reporting flu like symptoms.  Symptoms started last night with a low grade fever ,chills, headache, ear ache, cough, congestion and diarrhea. She reports an exposure to covid with family members.   She reports a tmax of 100.3 this morning ~6am, however took Tylenol and now down to "97.8"  She denies any nausea, vomiting or shortness of breath.   She reports she does have a covid test at home, however she will not be able to test until her son comes over later this afternoon to help her.   Patient advised to test today and to call me back with result.  Advised warm fluids with honey, continue Tylenol for body aches/fevers and to stay well hydrated. ED precautions discussed.   She is unable to make an apt as she does not drive.   Will forward to PCP.

## 2023-09-28 NOTE — Telephone Encounter (Signed)
 Please offer virtual visit for patient tomorrow AM.  Terisa Starr, MD  [facility] Medicine Teaching Service

## 2023-09-29 ENCOUNTER — Telehealth: Admitting: Family Medicine

## 2023-09-29 ENCOUNTER — Encounter: Payer: Self-pay | Admitting: Family Medicine

## 2023-09-29 VITALS — BP 152/78 | HR 63 | Temp 98.0°F | Ht 61.0 in | Wt 114.0 lb

## 2023-09-29 DIAGNOSIS — U071 COVID-19: Secondary | ICD-10-CM | POA: Diagnosis not present

## 2023-09-29 NOTE — Progress Notes (Signed)
 Patient ID: Jacqueline [name], female   DOB: 02/10/1931, 89 y.o.   MRN: [id number] CC: non productive cough and head congestion  Covid-qualifying symptoms - What is your worst symptoms:  Fatigue, myalgias - Onset: Symptoms started on 09/27/23 - Course of symptoms: No more fever and rigors today - Headache: yes, "My head feels full and my ears are plugged up" - Muscle/Joint aches: yes - Cough: scantly productive phlegm - no shortness of breath, no chest pain - What does fatigue keep you from doing?  Staying mostly in chair or bed because of fatigue and muscle aches - Rhinorrhea: yes - Diarrhea: yes, one episode on 3/325 - Nausea/Vomiting: no, but decreased appetite - Confusion: no - [name]'s home CoViD test was positive 3/4. - Comorbidities: CAD, pAF on Amniodarone, Diastolic HF, PAH - Immunocompetence: no immunosuppressing meds or malignancies - daughter-in-law and two grandchildren had CoViD infection last week, though her only indirect contact to them was thru her son. He has not been ill.  - [name] has received 3 doses of Pfizer vaccinations, last was 05/16/20.  - She is taking APAP 1000 three times a day which seems to help.   SH:  Social History   Tobacco Use  Smoking Status Never  Smokeless Tobacco Never    ROS: See HPI  PHYSICAL EXAM  General: No audible evidence of physical distress Lung: No audible evidence of difficulty breathing or increased work of breathing.   A/P   CoViD-19 infections (Possible or Proven)    - Mild symptoms              - 5 days of self-Isolation and without fever then wear mask for additional 5 days when around others.              - Self-monitor and report progression of symptoms              - Self-management: Fluids, rest, analgesics, Brief use Afrin NS, Delsym for cough              - [name]'s Amiodarone is listed as a "Do Not Administer" with Paxlovid on the [facility] interaction site.  No Paxlovid was recommended for  [name].               - Reviewed symptoms including shortness of breath, chest pain.  If present, I asked [name] to go to ED within 2 hours.   Visit duration: 15 minutes

## 2023-10-06 ENCOUNTER — Other Ambulatory Visit: Payer: Self-pay | Admitting: *Deleted

## 2023-10-06 ENCOUNTER — Telehealth: Payer: Self-pay | Admitting: Student

## 2023-10-06 NOTE — Patient Outreach (Signed)
 Care Management   Visit Note  10/06/2023 Name: Jacqueline [name] MRN: [id number] DOB: February 28, 1931  Subjective: Jacqueline [name] is a 89 y.o. year old female who is a primary care patient of Caro Laroche, DO. The Care Management team was consulted for assistance.      Engaged with patient spoke with patient by telephone.    Goals Addressed             This Visit's Progress    RNCM Care Management Exepected Outcome: Monitor, Self-Manage and Reduce Symptoms: HTN, AFIB       Current Barriers:  Knowledge Deficits related to plan of care for management of Atrial Fibrillation and HTN  Chronic Disease Management support and education needs related to Atrial Fibrillation and HTN   RNCM Clinical Goal(s):  Patient will verbalize basic understanding of  Atrial Fibrillation and HTN disease process and self health management plan as evidenced by verbal explanation, recognizing symptoms, lifestyle modifications take all medications exactly as prescribed and will call provider for medication related questions as evidenced by being compliant with all medications attend all scheduled medical appointments: primary care provider and specialist as evidenced by maintaining all scheduled appointments continue to work with RN Care Manager to address care management and care coordination needs related to  Atrial Fibrillation and HTN as evidenced by adherence to CM Team Scheduled appointments through collaboration with RN Care manager, provider, and care team.   Interventions: Evaluation of current treatment plan related to  self management and patient's adherence to plan as established by provider   AFIB Interventions: (Status:  Condition stable.  Not addressed this visit.) Long Term Goal Counseled on increased risk of stroke due to Afib and benefits of anticoagulation for stroke prevention Reviewed importance of adherence to anticoagulant exactly as prescribed. Reports compliance with  mediation Counseled on bleeding risk associated with taking Eliquis and importance of self-monitoring for signs/symptoms of bleeding. Denies any bleeding at this time. Counseled on avoidance of NSAIDs due to increased bleeding risk with anticoagulants Counseled on importance of regular laboratory monitoring as prescribed Counseled on seeking medical attention after a head injury or if there is blood in the urine/stool Screening for signs and symptoms of depression related to chronic disease state   Hypertension Interventions:  (Status:  Goal on track:  Yes.) Long Term Goal Last practice recorded BP readings:  BP Readings from Last 3 Encounters:  09/29/23 (!) 152/78  08/30/23 (!) 142/60  08/26/23 (!) 157/62  Self Reported: 142/80 Hr 63 on 07-16-2023 Most recent eGFR/CrCl:  Lab Results  Component Value Date   EGFR 29 (L) 08/30/2023    No components found for: "CRCL"  Evaluation of current treatment plan related to hypertension self management and patient's adherence to plan as established by provider. Patient takes blood pressure daily and keeps log. No complaints of headaches, chest pain, or swelling in her extremities at this time. She states that she had covid and she has been recovering but she is very weak. She reports BP this morning 118/72 Provided education to patient re: stroke prevention, s/s of heart attack and stroke. Education and support provided. Reviewed medications with patient and discussed importance of compliance. Patient compliant with all medications. Counseled on adverse effects of illicit drug and excessive alcohol use in patients with high blood pressure  Counseled on the importance of exercise goals with target of 150 minutes per week Discussed plans with patient for ongoing care management follow up and provided patient with direct contact information  for care management team Advised patient, providing education and rationale, to monitor blood pressure daily and  record, calling PCP for findings outside established parameters Reviewed scheduled/upcoming provider appointments including: 12-29-2023 with Cardiology Discussed complications of poorly controlled blood pressure such as heart disease, stroke, circulatory complications, vision complications, kidney impairment, sexual dysfunction  Patient Goals/Self-Care Activities: Take all medications as prescribed Attend all scheduled provider appointments Call pharmacy for medication refills 3-7 days in advance of running out of medications Perform all self care activities independently  Call provider office for new concerns or questions   Follow Up Plan:  Telephone follow up appointment with care management team member scheduled for:  11-09-2023 at 9:30 am           Consent to Services:  Patient was given information about care management services, agreed to services, and gave verbal consent to participate.   Plan: Telephone follow up appointment with care management team member scheduled for:11-09-2023 at 9:30 am  Larey Brick, BSN RN [facility], [facility] RN Care Manager Direct Dial: [phone]  Fax: [phone]

## 2023-10-06 NOTE — Telephone Encounter (Signed)
 Called patient after receiving an afterhour page.  Patient reports she recently tested positive for COVID.  And requesting antibiotics for continued cough.  Denies any headache, fever, shortness of breath or chest pain.  Patient advised giving no acute change will need to call to schedule an in person appointment tomorrow to be evaluated in person and to determine need for additional treatment including antibiotics if appropriate.  ED precautions discussed with patient.  Patient verbalized understanding and agreeable to plan.

## 2023-10-06 NOTE — Telephone Encounter (Signed)
 Error

## 2023-10-06 NOTE — Patient Instructions (Signed)
 Visit Information  Thank you for taking time to visit with me today. Please don't hesitate to contact me if I can be of assistance to you before our next scheduled telephone appointment.  Following are the goals we discussed today:   Goals Addressed             This Visit's Progress    RNCM Care Management Exepected Outcome: Monitor, Self-Manage and Reduce Symptoms: HTN, AFIB       Current Barriers:  Knowledge Deficits related to plan of care for management of Atrial Fibrillation and HTN  Chronic Disease Management support and education needs related to Atrial Fibrillation and HTN   RNCM Clinical Goal(s):  Patient will verbalize basic understanding of  Atrial Fibrillation and HTN disease process and self health management plan as evidenced by verbal explanation, recognizing symptoms, lifestyle modifications take all medications exactly as prescribed and will call provider for medication related questions as evidenced by being compliant with all medications attend all scheduled medical appointments: primary care provider and specialist as evidenced by maintaining all scheduled appointments continue to work with RN Care Manager to address care management and care coordination needs related to  Atrial Fibrillation and HTN as evidenced by adherence to CM Team Scheduled appointments through collaboration with RN Care manager, provider, and care team.   Interventions: Evaluation of current treatment plan related to  self management and patient's adherence to plan as established by provider   AFIB Interventions: (Status:  Condition stable.  Not addressed this visit.) Long Term Goal Counseled on increased risk of stroke due to Afib and benefits of anticoagulation for stroke prevention Reviewed importance of adherence to anticoagulant exactly as prescribed. Reports compliance with mediation Counseled on bleeding risk associated with taking Eliquis and importance of self-monitoring for  signs/symptoms of bleeding. Denies any bleeding at this time. Counseled on avoidance of NSAIDs due to increased bleeding risk with anticoagulants Counseled on importance of regular laboratory monitoring as prescribed Counseled on seeking medical attention after a head injury or if there is blood in the urine/stool Screening for signs and symptoms of depression related to chronic disease state   Hypertension Interventions:  (Status:  Goal on track:  Yes.) Long Term Goal Last practice recorded BP readings:  BP Readings from Last 3 Encounters:  09/29/23 (!) 152/78  08/30/23 (!) 142/60  08/26/23 (!) 157/62  Self Reported: 142/80 Hr 63 on 07-16-2023 Most recent eGFR/CrCl:  Lab Results  Component Value Date   EGFR 29 (L) 08/30/2023    No components found for: "CRCL"  Evaluation of current treatment plan related to hypertension self management and patient's adherence to plan as established by provider. Patient takes blood pressure daily and keeps log. No complaints of headaches, chest pain, or swelling in her extremities at this time. She states that she had covid and she has been recovering but she is very weak. She reports BP this morning 118/72 Provided education to patient re: stroke prevention, s/s of heart attack and stroke. Education and support provided. Reviewed medications with patient and discussed importance of compliance. Patient compliant with all medications. Counseled on adverse effects of illicit drug and excessive alcohol use in patients with high blood pressure  Counseled on the importance of exercise goals with target of 150 minutes per week Discussed plans with patient for ongoing care management follow up and provided patient with direct contact information for care management team Advised patient, providing education and rationale, to monitor blood pressure daily and record, calling PCP for  findings outside established parameters Reviewed scheduled/upcoming provider  appointments including: 12-29-2023 with Cardiology Discussed complications of poorly controlled blood pressure such as heart disease, stroke, circulatory complications, vision complications, kidney impairment, sexual dysfunction  Patient Goals/Self-Care Activities: Take all medications as prescribed Attend all scheduled provider appointments Call pharmacy for medication refills 3-7 days in advance of running out of medications Perform all self care activities independently  Call provider office for new concerns or questions   Follow Up Plan:  Telephone follow up appointment with care management team member scheduled for:  11-09-2023 at 9:30 am           Our next appointment is by telephone on 11-09-2023 at 9:30 am  Please call the care guide team at [phone] if you need to cancel or reschedule your appointment.   If you are experiencing a Mental Health or Behavioral Health Crisis or need someone to talk to, please call the Suicide and Crisis Lifeline: 988 call the [place] National Suicide Prevention Lifeline: 5086126583 or TTY: 2890098453 TTY (304) 088-0953) to talk to a trained counselor call 1-800-273-TALK (toll free, 24 hour hotline)   Patient verbalizes understanding of instructions and care plan provided today and agrees to view in MyChart. Active MyChart status and patient understanding of how to access instructions and care plan via MyChart confirmed with patient.     Telephone follow up appointment with care management team member scheduled for:  Larey Brick, BSN RN [facility], [facility] RN Care Manager Direct Dial: [phone]  Fax: [phone]

## 2023-10-07 DIAGNOSIS — J0100 Acute maxillary sinusitis, unspecified: Secondary | ICD-10-CM | POA: Diagnosis not present

## 2023-10-07 NOTE — Telephone Encounter (Signed)
 Patient returns call to nurse line. She reports concerns with continued symptoms from COVID and is now worried that she has a sinus infection.    She is still experiencing headaches, facial pain weakness, cough and hoarseness.   Thick, clear nasal drainage.   Denies fever or chills.   Reports decreased appetite, she has been drinking boost and water.   Provided with supportive measures and ED precautions.   She is unable to schedule appointment as she has a difficult time coming into the office.  She is requesting antibiotic for possible sinus infection.   We do not have anymore appointment availability for this week.   Will forward to PCP for further advisement.   Veronda Prude, RN

## 2023-10-07 NOTE — Telephone Encounter (Signed)
 Patient LVM on nurse line regarding concerns.   Attempted to call patient back. She did not answer. LVM askign that patient return call to office regarding her concerns.   Veronda Prude, RN

## 2023-10-08 ENCOUNTER — Other Ambulatory Visit: Payer: Self-pay | Admitting: Cardiovascular Disease

## 2023-10-08 IMAGING — DX DG CHEST 1V PORT
1 series · 1 of 1 positions shown · non-contrast
Comparison: Chest XRs, most recent the 10/22/2021. CT chest,
06/08/2015

CLINICAL DATA: Shortness of breath.

EXAM:
PORTABLE CHEST 1 VIEW

[chest]
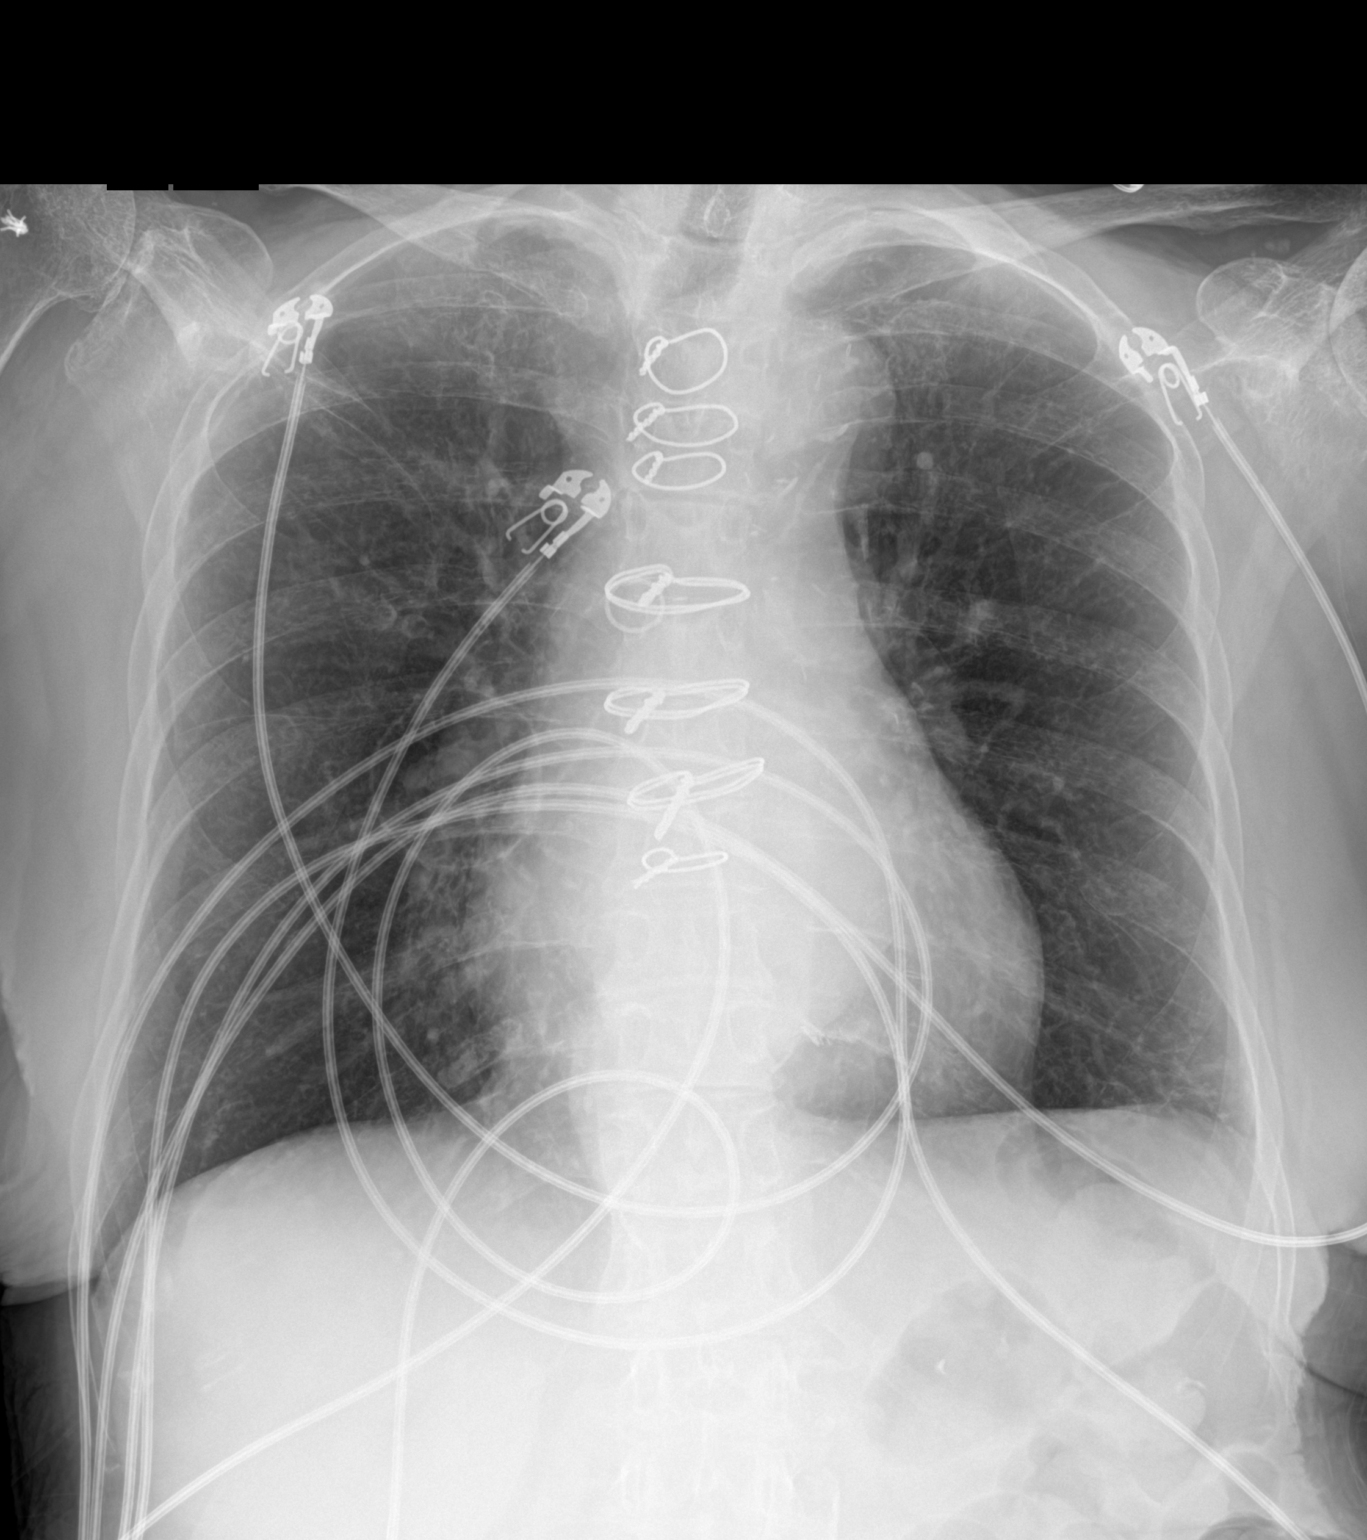

[1 of 1 positions shown; findings below may reference images not displayed]

FINDINGS: Cardiomediastinal silhouette is within normal limits. Postsurgical
changes of coronary bypass. Lungs are well inflated. No focal
consolidation or mass. Well-circumscribed RIGHT mid lung
radiolucency, consistent with known pulmonary cyst. No pleural
effusion or pneumothorax. Splenic artery calcifications. Median
sternotomy change. No acute osseous abnormality.
IMPRESSION: No acute cardiopulmonary process.

## 2023-10-15 DIAGNOSIS — I10 Essential (primary) hypertension: Secondary | ICD-10-CM | POA: Diagnosis not present

## 2023-10-15 DIAGNOSIS — Z79899 Other long term (current) drug therapy: Secondary | ICD-10-CM | POA: Diagnosis not present

## 2023-10-15 LAB — BASIC METABOLIC PANEL
BUN/Creatinine Ratio: 14 (ref 12–28)
BUN: 18 mg/dL (ref 10–36)
CO2: 20 mmol/L (ref 20–29)
Calcium: 9.3 mg/dL (ref 8.7–10.3)
Chloride: 104 mmol/L (ref 96–106)
Creatinine, Ser: 1.25 mg/dL — ABNORMAL HIGH (ref 0.57–1.00)
Glucose: 85 mg/dL (ref 70–99)
Potassium: 4.9 mmol/L (ref 3.5–5.2)
Sodium: 141 mmol/L (ref 134–144)
eGFR: 41 mL/min/{1.73_m2} — ABNORMAL LOW (ref >59)

## 2023-10-18 ENCOUNTER — Telehealth: Payer: Self-pay | Admitting: Cardiovascular Disease

## 2023-10-18 NOTE — Telephone Encounter (Signed)
 Patient called w/results. Reiterated to take lasix 20mg  daily PRN, per Juliane Lack NP note from 08/2023.

## 2023-10-18 NOTE — Telephone Encounter (Signed)
 BMET from 10/15/23 pending provider review. Message routed.

## 2023-10-18 NOTE — Telephone Encounter (Signed)
 Kidney function tests are much better, back to baseline.  Continue current medications including diuretics as needed.

## 2023-10-18 NOTE — Telephone Encounter (Signed)
 Patient called to follow-up on lab results.

## 2023-10-20 ENCOUNTER — Telehealth: Payer: Self-pay

## 2023-10-20 NOTE — Telephone Encounter (Signed)
Spoke with pt. Pt was notified of lab results. Pt will continue her current medication and f/u as planned.  

## 2023-10-28 ENCOUNTER — Ambulatory Visit (INDEPENDENT_AMBULATORY_CARE_PROVIDER_SITE_OTHER): Payer: HMO

## 2023-10-28 ENCOUNTER — Telehealth: Payer: Self-pay

## 2023-10-28 VITALS — BP 151/82 | HR 83 | Ht 61.0 in | Wt 114.0 lb

## 2023-10-28 DIAGNOSIS — Z0000 Encounter for general adult medical examination without abnormal findings: Secondary | ICD-10-CM | POA: Diagnosis not present

## 2023-10-28 NOTE — Progress Notes (Signed)
 Because this visit was a virtual/telehealth visit,  certain criteria was not obtained, such a blood pressure, CBG if applicable, and timed get up and go. Any medications not marked as "taking" were not mentioned during the medication reconciliation part of the visit. Any vitals not documented were not able to be obtained due to this being a telehealth visit or patient was unable to self-report a recent blood pressure reading due to a lack of equipment at home via telehealth. Vitals that have been documented are verbally provided by the patient.   Patient was able to obtain and report vital signs for today, 10/28/2023.  Subjective:   Jacqueline [name] is a 89 y.o. who presents for a Medicare Wellness preventive visit.  Visit Complete: Virtual I connected with  [name] on 10/28/23 by a audio enabled telemedicine application and verified that I am speaking with the correct person using two identifiers.  Patient Location: Home  Provider Location: Office/Clinic  I discussed the limitations of evaluation and management by telemedicine. The patient expressed understanding and agreed to proceed.  Vital Signs: Because this visit was a virtual/telehealth visit, some criteria may be missing or patient reported. Any vitals not documented were not able to be obtained and vitals that have been documented are patient reported.  VideoDeclined- This patient declined Librarian, academic. Therefore the visit was completed with audio only.  Persons Participating in Visit: Patient.  AWV Questionnaire: No: Patient Medicare AWV questionnaire was not completed prior to this visit.  Cardiac Risk Factors include: advanced age (>72men, >75 women);hypertension;dyslipidemia;family history of premature cardiovascular disease;sedentary lifestyle     Objective:    Today's Vitals   10/28/23 1532  BP: (!) 151/82  Pulse: 83  Weight: 114 lb (51.7 kg)  Height: 5\' 1"  (1.549 m)   PainSc: 0-No pain   Body mass index is 21.54 kg/m.     10/28/2023    3:34 PM 08/25/2023    6:45 PM 02/09/2023   10:34 AM 12/09/2022    2:46 PM 12/05/2022   10:50 AM 11/12/2022   10:25 AM 08/31/2022    9:49 AM  Advanced Directives  Does Patient Have a Medical Advance Directive? Yes Yes No Yes No Yes No  Type of Estate agent of [place];Living will Healthcare Power of [place];Living will    Healthcare Power of [place];Living will   Does patient want to make changes to medical advance directive?    No - Patient declined     Copy of Healthcare Power of Attorney in Chart? No - copy requested     No - copy requested   Would patient like information on creating a medical advance directive?     No - Patient declined  No - Patient declined    Current Medications (verified) Outpatient Encounter Medications as of 10/28/2023  Medication Sig   acetaminophen (TYLENOL) 325 MG tablet Take 2 tablets (650 mg total) by mouth every 6 (six) hours.   ALPRAZolam (XANAX) 0.5 MG tablet TAKE 1 TABLET BY MOUTH AT BEDTIME AS NEEDED FOR ANXIETY   amiodarone (PACERONE) 200 MG tablet TAKE 1 TABLET BY MOUTH EVERY DAY   amLODipine (NORVASC) 5 MG tablet TAKE 1 TABLET (5 MG TOTAL) BY MOUTH DAILY.   apixaban (ELIQUIS) 2.5 MG TABS tablet Take 1 tablet (2.5 mg total) by mouth 2 (two) times daily.   apixaban (ELIQUIS) 2.5 MG TABS tablet Take 1 tablet (2.5 mg total) by mouth 2 (two) times daily.  apixaban (ELIQUIS) 2.5 MG TABS tablet Take 1 tablet (2.5 mg total) by mouth 2 (two) times daily.   cholecalciferol (VITAMIN D3) 25 MCG (1000 UNIT) tablet Take 1,000 Units by mouth daily.   cycloSPORINE (RESTASIS) 0.05 % ophthalmic emulsion Place 1 drop into both eyes 2 (two) times daily.   ELIQUIS 2.5 MG TABS tablet TAKE 1 TABLET BY MOUTH TWICE A DAY   furosemide (LASIX) 20 MG tablet Take 1 tablet (20 mg total) by mouth daily.   gabapentin (NEURONTIN) 100 MG capsule TAKE 1 CAPSULE (100 MG TOTAL) BY MOUTH 3 TIMES  A DAY   levothyroxine (SYNTHROID) 75 MCG tablet Take 1 tablet (75 mcg total) by mouth daily.   Multiple Vitamin (MULTIVITAMIN WITH MINERALS) TABS tablet Take 1 tablet by mouth daily.   nitroGLYCERIN (NITROSTAT) 0.4 MG SL tablet Place 1 tablet (0.4 mg total) under the tongue every 5 (five) minutes as needed for chest pain.   Omega-3 Fatty Acids (FISH OIL) 1000 MG CAPS Take 1,000 mg by mouth every evening.   omeprazole (PRILOSEC) 20 MG capsule TAKE 1 CAPSULE BY MOUTH EVERY DAY   Polyethyl Glycol-Propyl Glycol (LUBRICANT EYE DROPS) 0.4-0.3 % SOLN Place 1 drop into both eyes 4 (four) times daily.   polyethylene glycol (MIRALAX / GLYCOLAX) 17 g packet Take 17 g by mouth 2 (two) times daily. (Patient taking differently: Take 17 g by mouth daily as needed for mild constipation.)   pravastatin (PRAVACHOL) 40 MG tablet Take 1 tablet (40 mg total) by mouth every evening.   rOPINIRole (REQUIP) 0.25 MG tablet TAKE 1 TABLET BY MOUTH 3 TIMES DAILY.   senna-docusate (SENOKOT-S) 8.6-50 MG tablet Take 2 tablets by mouth 2 (two) times daily. (Patient taking differently: Take 2 tablets by mouth at bedtime as needed for mild constipation.)   hydrALAZINE (APRESOLINE) 25 MG tablet Take 1 tablet (25 mg total) by mouth 3 (three) times daily as needed (may take up to three times daily for BP >160).   potassium chloride SA (KLOR-CON M) 20 MEQ tablet Take 1 tablet (20 mEq total) by mouth daily. (Patient not taking: Reported on 10/28/2023)   No facility-administered encounter medications on file as of 10/28/2023.    Allergies (verified) Atorvastatin, Beta adrenergic blockers, Crestor [rosuvastatin], Morphine and codeine, and Prednisone   History: Past Medical History:  Diagnosis Date   Abnormal ECG 10/22/2021   Arthritis    back   Atrial fibrillation with rapid ventricular response (HCC) 02/24/2014   a. CHA2DS2VASc = 6 -> on eliquis;  b. 02/2014 s/p DCCV;  c. 08/2014 Echo: EF 55-60%, Gr 2 DD, mild MR, triv AI.    Bronchitis 10/22/2021   CAD (coronary artery disease)    a. s/p CABG x 2 (LIMA->LAD, VG->Diag);  b. 08/2014 Cath: LM nl, LAD 90p, LCX nl, RCA nl/dominant, LIMA->LAD atretic, VG->D2 patent w/ retrograde filling of LAD, EF 55-60%-->Med Rx.   Diverticulitis 02/21/2012   Elevated troponin 10/22/2021   GERD (gastroesophageal reflux disease)    Hyperlipemia    Hypertension    Insomnia    Vertigo 11/24/2018   Past Surgical History:  Procedure Laterality Date   ABDOMINAL HYSTERECTOMY  1975   CARDIAC CATHETERIZATION N/A 05/09/2015   Procedure: Right Heart Cath;  Surgeon: Laurey Morale, MD;  Location: [facility] INVASIVE CV LAB;  Service: Cardiovascular;  Laterality: N/A;   CARDIOVERSION N/A 02/28/2014   Procedure: CARDIOVERSION;  Surgeon: Thurmon Fair, MD;  Location: [facility] ENDOSCOPY;  Service: Cardiovascular;  Laterality: N/A;   CARDIOVERSION N/A 01/30/2019  Procedure: CARDIOVERSION;  Surgeon: Pricilla Riffle, MD;  Location: [facility] ENDOSCOPY;  Service: Cardiovascular;  Laterality: N/A;   CATARACT EXTRACTION Bilateral 2014   with lens implanted   COLONOSCOPY N/A 02/19/2015   Procedure: COLONOSCOPY;  Surgeon: Meryl Dare, MD;  Location: [facility] ENDOSCOPY;  Service: Endoscopy;  Laterality: N/A;   CORONARY ARTERY BYPASS GRAFT  2006   off-pump bypass surgery with LIMA to the LAD and SVG to the second diagonal artery ( performed by Dr Tyrone Sage)    FEMUR IM NAIL Right 07/18/2022   Procedure: INTRAMEDULLARY (IM) NAIL, HIP;  Surgeon: Sheral Apley, MD;  Location: [facility] OR;  Service: Orthopedics;  Laterality: Right;   JOINT REPLACEMENT Bilateral    L-2004, R-2006   LEFT HEART CATHETERIZATION WITH CORONARY/GRAFT ANGIOGRAM N/A 09/17/2014   Procedure: LEFT HEART CATHETERIZATION WITH Isabel Caprice;  Surgeon: Lennette Bihari, MD;  Location: [facility] CATH LAB;  Service: Cardiovascular;  Laterality: N/A;   REIMPLANTATION OF TOTAL KNEE Right 10/08/2014   Procedure: REIMPLANTATION OF RIGHT TOTAL KNEE ARTHROPLASTY WITH REMOVAL  OF ANTIBIOTIC SPACER;  Surgeon: Durene Romans, MD;  Location: [facility] ORS;  Service: Orthopedics;  Laterality: Right;   ROTATOR CUFF REPAIR Bilateral    r-1999, l- 2005   TEE WITHOUT CARDIOVERSION N/A 02/28/2014   Procedure: TRANSESOPHAGEAL ECHOCARDIOGRAM (TEE);  Surgeon: Thurmon Fair, MD;  Location: [facility] ENDOSCOPY;  Service: Cardiovascular;  Laterality: N/A;   TEE WITHOUT CARDIOVERSION N/A 01/30/2019   Procedure: TRANSESOPHAGEAL ECHOCARDIOGRAM (TEE);  Surgeon: Pricilla Riffle, MD;  Location: [facility] ENDOSCOPY;  Service: Cardiovascular;  Laterality: N/A;   TOTAL KNEE ARTHROPLASTY Right 07/02/2014   Procedure: Resection of Infected Right Total Knee Arthroplasty with placement antibiotic spacer;  Surgeon: Shelda Pal, MD;  Location: [facility] ORS;  Service: Orthopedics;  Laterality: Right;   Family History  Problem Relation Age of Onset   Parkinson's disease Mother    Heart attack Brother    Arthritis Brother    Cancer Brother        Unknown   Breast cancer Neg Hx    Social History   Socioeconomic History   Marital status: Widowed    Spouse name: Not on file   Number of children: 2   Years of education: 10   Highest education level: 10th grade  Occupational History   Not on file  Tobacco Use   Smoking status: Never   Smokeless tobacco: Never  Vaping Use   Vaping status: Never Used  Substance and Sexual Activity   Alcohol use: No   Drug use: No   Sexual activity: Not Currently    Birth control/protection: Post-menopausal  Other Topics Concern   Not on file  Social History Narrative   Current Social History 05/11/2017           Patient lives alone in one level home    Transportation: Patient has own vehicle and drives herself    Important Relationships Children, grandchildren, Art gallery manager and daughter-in-law are all nearby   Pets: None 05/11/2017   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 Drinks water and one cup of coffee daily.   Religious / Personal Beliefs: Baptist. "I believe Jesus died on the cross to save me [id number] my sins. I am saved by the grace of God."    Other: "I have a good life and am happy.  My family is there when I need help."                                                              Social Drivers of Health   Financial Resource Strain: Low Risk  (10/28/2023)   Overall Financial Resource Strain (CARDIA)    Difficulty of Paying Living Expenses: Not hard at all  Food Insecurity: No Food Insecurity (10/28/2023)   Hunger Vital Sign    Worried About Running Out of Food in the Last Year: Never true    Ran Out of Food in the Last Year: Never true  Transportation Needs: No Transportation Needs (10/28/2023)   PRAPARE - Administrator, Civil Service (Medical): No    Lack of Transportation (Non-Medical): No  Physical Activity: Inactive (10/28/2023)   Exercise Vital Sign    Days of Exercise per Week: 0 days    Minutes of Exercise per Session: 0 min  Stress: No Stress Concern Present (10/28/2023)   [employer] of Occupational Health - Occupational Stress Questionnaire    Feeling of Stress : Not at all  Social Connections: Moderately Integrated (10/28/2023)   Social Connection and Isolation Panel [NHANES]    Frequency of Communication with Friends and Family: More than three times a week    Frequency of Social Gatherings with Friends and Family: More than three times a week    Attends Religious Services: More than 4 times per year    Active Member of [employer] or Organizations: Yes    Attends Banker Meetings: More than 4 times per year    Marital Status: Widowed    Tobacco Counseling Counseling given: Not Answered    Clinical Intake:  Pre-visit preparation completed: Yes  Pain : No/denies pain Pain Score: 0-No pain     BMI - recorded: 21.54 Nutritional Status: BMI of 19-24   Normal Nutritional Risks: None Diabetes: No  Lab Results  Component Value Date   HGBA1C 5.8 (H) 07/23/2017   HGBA1C 5.6 02/18/2015     How often do you need to have someone help you when you read instructions, pamphlets, or other written materials from your doctor or pharmacy?: 1 - Never What is the last grade level you completed in school?: 10TH GRADE  Interpreter Needed?: No  Information entered by :: Chaye Misch N. Audrey Thull, LPN.   Activities of Daily Living     10/28/2023    3:37 PM 11/12/2022   10:23 AM  In your present state of health, do you have any difficulty performing the following activities:  Hearing? 0 0  Vision? 0 0  Difficulty concentrating or making decisions? 0 0  Comment BRAIN STIMULATING EXERCISES: READING, PUZZLES, GAMES ON PHONE   Walking or climbing stairs? 0 0  Dressing or bathing? 0 0  Doing errands, shopping? 0 0  Preparing Food and eating ? N N  Using the Toilet? N N  In the past six months, have you accidently leaked urine? Y N  Comment WEARS A PAD FOR LEAKAGE   Do you have problems with loss of bowel control? N N  Managing your Medications? N N  Managing your Finances? N N  Housekeeping or managing your Housekeeping? N N    Patient Care Team: Caro Laroche, DO as  PCP - General (Family Medicine) Croitoru, Rachelle Hora, MD as PCP - Cardiology (Cardiology) Croitoru, Rachelle Hora, MD as Consulting Physician (Cardiology) Duke, Roe Rutherford, [place] as Physician Assistant (Cardiology) Ricky Stabs, RN as VBCI Care Management (General Practice) Holli Humbles, MD as Referring Physician (Ophthalmology)  Indicate any recent Medical Services you may have received from other than [facility] providers in the past year (date may be approximate).     Assessment:   This is a routine wellness examination for Jacqueline [name].  Hearing/Vision screen Hearing Screening - Comments:: Denies hearing difficulties.  Vision Screening - Comments:: Wears rx glasses - up to date with  routine eye exams with Holli Humbles, MD. Patient has dry eyes and uses Retasis eye drops.    Goals Addressed             This Visit's Progress    Client understands the importance of follow-up with providers by attending scheduled visits         Depression Screen     10/28/2023    3:38 PM 09/29/2023   10:45 AM 04/30/2023    9:40 AM 11/12/2022   10:22 AM 08/31/2022   10:01 AM 05/25/2022   10:07 AM 05/07/2022    9:35 AM  PHQ 2/9 Scores  PHQ - 2 Score   0 0 0 0 6  PHQ- 9 Score   2  2 2 25   Exception Documentation Patient refusal Patient refusal         Fall Risk     10/28/2023    3:36 PM 10/06/2023    2:00 PM 09/29/2023   10:45 AM 04/30/2023    9:40 AM 11/12/2022   10:23 AM  Fall Risk   Falls in the past year? 0 0 0 0 1  Number falls in past yr: 0 0 0 0 0  Injury with Fall? 0 0 0  1  Comment     Fx hip followed by medical attention.  Risk for fall due to : No Fall Risks    Impaired balance/gait  Follow up Falls prevention discussed;Falls evaluation completed    Falls prevention discussed    MEDICARE RISK AT HOME:  Medicare Risk at Home Any stairs in or around the home?: No If so, are there any without handrails?: No Home free of loose throw rugs in walkways, pet beds, electrical cords, etc?: Yes Adequate lighting in your home to reduce risk of falls?: Yes Life alert?: Yes (NECKLACE) Use of a cane, walker or w/c?: Yes (ROLLATOR) Grab bars in the bathroom?: Yes Shower chair or bench in shower?: Yes (WALK-IN-SHOWER WITH BUILT IN SEAT) Elevated toilet seat or a handicapped toilet?: Yes  TIMED UP AND GO:  Was the test performed?  No  Cognitive Function: 6CIT completed    10/28/2023    3:37 PM 07/18/2018    2:32 PM  MMSE - Mini Mental State Exam  Not completed: Unable to complete   Orientation to time  5  Orientation to Place  5  Registration  3  Attention/ Calculation  5  Recall  3  Language- name 2 objects  2  Language- repeat  1  Language- follow 3 step  command  3  Language- read & follow direction  1  Write a sentence  1  Copy design  1  Total score  30        10/28/2023    3:37 PM 11/12/2022   10:25 AM 07/18/2018    2:33 PM  6CIT Screen  What Year?  0 points 0 points 0 points  What month? 0 points 0 points 0 points  What time? 0 points 0 points 0 points  Count back from 20 0 points 0 points 0 points  Months in reverse 0 points 4 points 0 points  Repeat phrase 0 points 0 points 0 points  Total Score 0 points 4 points 0 points    Immunizations Immunization History  Administered Date(s) Administered   Fluad Quad(high Dose 65+) 04/23/2021, 05/07/2022   Influenza Split 05/01/2011, 04/19/2012   Influenza Whole 05/05/2007, 04/20/2008, 05/16/2008, 05/02/2009, 04/23/2010   Influenza,inj,Quad PF,6+ Mos 05/04/2013, 05/09/2014, 03/27/2015, 04/08/2016, 03/24/2017, 07/18/2018, 03/23/2019   PFIZER(Purple Top)SARS-COV-2 Vaccination 09/10/2019, 10/03/2019, 05/16/2020   Pneumococcal Conjugate-13 12/22/2013   Pneumococcal Polysaccharide-23 10/25/1997, 03/04/2012   RSV,unspecified 05/26/2022   Respiratory Syncytial Virus Vaccine,Recomb Aduvanted(Arexvy) 05/26/2022   Td 10/25/1997, 12/30/2007   Zoster Recombinant(Shingrix) 01/09/2022, 04/09/2022    Screening Tests Health Maintenance  Topic Date Due   DTaP/Tdap/Td (3 - Tdap) 12/29/2017   COVID-19 Vaccine (4 - 2024-25 season) 03/28/2023   INFLUENZA VACCINE  02/25/2024   Medicare Annual Wellness (AWV)  10/27/2024   Pneumonia Vaccine 36+ Years old  Completed   DEXA SCAN  Completed   Zoster Vaccines- Shingrix  Completed   HPV VACCINES  Aged Out    Health Maintenance  Health Maintenance Due  Topic Date Due   DTaP/Tdap/Td (3 - Tdap) 12/29/2017   COVID-19 Vaccine (4 - 2024-25 season) 03/28/2023   Health Maintenance Items Addressed: Yes; Patient is due for Covid-19 and Dtap vaccine.  Additional Screening:  Vision Screening: Recommended annual ophthalmology exams for early detection of  glaucoma and other disorders of the eye.  Dental Screening: Recommended annual dental exams for proper oral hygiene  Community Resource Referral / Chronic Care Management: CRR required this visit?  No   CCM required this visit?  No     Plan:     I have personally reviewed and noted the following in the patient's chart:   Medical and social history Use of alcohol, tobacco or illicit drugs  Current medications and supplements including opioid prescriptions. Patient is not currently taking opioid prescriptions. Functional ability and status Nutritional status Physical activity Advanced directives List of other physicians Hospitalizations, surgeries, and ER visits in previous 12 months Vitals Screenings to include cognitive, depression, and falls Referrals and appointments  In addition, I have reviewed and discussed with patient certain preventive protocols, quality metrics, and best practice recommendations. A written personalized care plan for preventive services as well as general preventive health recommendations were provided to patient.     Mickeal Needy, LPN   12/29/7844   After Visit Summary: (MyChart) Due to this being a telephonic visit, the after visit summary with patients personalized plan was offered to patient via MyChart   Notes: Please refer to Routing Comments.

## 2023-10-28 NOTE — Patient Instructions (Signed)
 Jacqueline [name] , Thank you for taking time to come for your Medicare Wellness Visit. I appreciate your ongoing commitment to your health goals. Please review the following plan we discussed and let me know if I can assist you in the future.   Referrals/Orders/Follow-Ups/Clinician Recommendations: Keep maintaining your health by keeping your appointments with Dr. Linwood Dibbles and any specialists that you may see.  Continue to check blood pressure every day and keep a blood pressure log to show Dr. Linwood Dibbles at your next appointment.  Call us if you need anything.  Have a great year!!!!  This is a list of the screening recommended for you and due dates:  Health Maintenance  Topic Date Due   DTaP/Tdap/Td vaccine (3 - Tdap) 12/29/2017   COVID-19 Vaccine (4 - 2024-25 season) 03/28/2023   Flu Shot  02/25/2024   Medicare Annual Wellness Visit  10/27/2024   Pneumonia Vaccine  Completed   DEXA scan (bone density measurement)  Completed   Zoster (Shingles) Vaccine  Completed   HPV Vaccine  Aged Out    Advanced directives: (Copy Requested) Please bring a copy of your health care power of attorney and living will to the office to be added to your chart at your convenience. You can mail to [facility] [address] [place], [place] [address] or email to [email or web address]  Next Medicare Annual Wellness Visit scheduled for next year: Yes

## 2023-10-28 NOTE — Telephone Encounter (Signed)
 During AWV today patient stated that she checks her blood pressure everyday.  This morning it was 151/82, pulse 83, 10/27/2023 is was 124/85 and 10/26/2023 it was 139/85.  Patient is very concerned about her bp readings.  She stated that she takes Atenolol 200 mg everyday.  She stated that she doesn't feel ill at all and no headaches.  Please advise. This patient is scheduled for ov on 11/01/2023 at 9:10 am.  Brand Males. Reymundo Poll, LPN [facility] Annual Wellness Team Direct Dial: [phone]

## 2023-11-01 ENCOUNTER — Telehealth: Payer: Self-pay

## 2023-11-01 ENCOUNTER — Encounter: Payer: Self-pay | Admitting: Family Medicine

## 2023-11-01 ENCOUNTER — Ambulatory Visit (INDEPENDENT_AMBULATORY_CARE_PROVIDER_SITE_OTHER): Admitting: Family Medicine

## 2023-11-01 VITALS — BP 135/83 | HR 90 | Ht 61.0 in | Wt 112.4 lb

## 2023-11-01 DIAGNOSIS — G2581 Restless legs syndrome: Secondary | ICD-10-CM | POA: Insufficient documentation

## 2023-11-01 DIAGNOSIS — I480 Paroxysmal atrial fibrillation: Secondary | ICD-10-CM | POA: Diagnosis not present

## 2023-11-01 DIAGNOSIS — I10 Essential (primary) hypertension: Secondary | ICD-10-CM

## 2023-11-01 DIAGNOSIS — G8929 Other chronic pain: Secondary | ICD-10-CM

## 2023-11-01 DIAGNOSIS — R296 Repeated falls: Secondary | ICD-10-CM | POA: Diagnosis not present

## 2023-11-01 DIAGNOSIS — R531 Weakness: Secondary | ICD-10-CM | POA: Diagnosis not present

## 2023-11-01 DIAGNOSIS — M545 Low back pain, unspecified: Secondary | ICD-10-CM | POA: Diagnosis not present

## 2023-11-01 MED ORDER — TRAMADOL HCL 50 MG PO TABS: 50.0000 mg | ORAL_TABLET | Freq: Four times a day (QID) | ORAL | 0 refills | Status: AC | PRN

## 2023-11-01 MED ORDER — ROPINIROLE HCL 0.25 MG PO TABS: ORAL_TABLET | ORAL | 1 refills | Status: DC

## 2023-11-01 NOTE — Assessment & Plan Note (Signed)
 Inhibiting sleep. Try increasing requip to 2 pills at bedtime, maintain 0.25mg  the other 2 doses, mindful of effects of lowering BP and contributing to dizziness.

## 2023-11-01 NOTE — Progress Notes (Signed)
   SUBJECTIVE:   CHIEF COMPLAINT / HPI:   Hypertension: - Medications: amlodipine, lasix, hydralazine - Compliance: good - AWV nurse noted patient was on atenolol. Patient does not recollect this and not listed on current medication list.  - Checking BP at home: yes. SBP 100-150s - some lightheadedness with sitting to standing.   RLS - L leg. Feels like "drawing up." Taking requip TID, doesn't feel it helps.   BACK PAIN Duration:  chronic, worse recently  since hip fracture. Mechanism of injury:  broke hip 06/2022 . Denies recent falls.  Location: R low back, radiates down R leg. Frequency: constant Aggravating factors: movement, walking, laying, and prolonged sitting Alleviating factors: nothing Status: worse Treatments attempted: tramadol, tylenol (doesn't help), gabapentin, voltaren gel, deep blue muscle rub cream.   Relief with NSAIDs?: No NSAIDs Taken, on eliquis Nighttime pain:  yes Paresthesias / decreased sensation:  no Bowel / bladder incontinence:  no Fevers:  no Dysuria / urinary frequency:  no Ambulates with walker.   OBJECTIVE:   BP 135/83   Pulse 90   Ht 5\' 1"  (1.549 m)   Wt 112 lb 6.4 oz (51 kg)   SpO2 98%   BMI 21.24 kg/m   Gen: well appearing, in NAD Card: irregular rhythm, regular rate.  Lungs: CTAB Ext: WWP, no edema MSK: no midline spinal tenderness. Limited AROM in flexion, extension, sidebending due to balance/age. No TTP over paravertebral musculature. +tenderness over R piriformis and greater trochanter. 4/5 LE strength bilterally. No LE edema.    ASSESSMENT/PLAN:   Paroxysmal atrial fibrillation (HCC) In afib today, rate controlled. Compliant on eliquis, continue. Continue to follow with cardiology.  Hypertension With wide pulse pressure. Follows with cardiology. Some orthostatic symptoms, at goal for age today with varying SBP at home, no changes today. Consider reaching out to cardiology if persistent orthostatic symptoms for possible  dose reduction. Reviewed recent labs.   Restless leg syndrome Inhibiting sleep. Try increasing requip to 2 pills at bedtime, maintain 0.25mg  the other 2 doses, mindful of effects of lowering BP and contributing to dizziness.   BACK PAIN, LOW Chronic, exacerbated since fracture 06/2022. With some weakness on exam, would benefit from PT, order for HHPT placed. Small amount of prn tramadol provided, only use for refractory pain. Continue tylenol, voltaren gel.      Caro Laroche, DO

## 2023-11-01 NOTE — Telephone Encounter (Signed)
 Patient calls nurse line in regards to Ropinirole.  She reports she saw PCP today the medication was changed from TID to 4x per day.   She is requesting a new script to reflect this. She reports she will be out of TID prescription soon.   Will forward to PCP,

## 2023-11-01 NOTE — Telephone Encounter (Signed)
 Called and spoke to patient and clarified the dosage on ROpinirole.  Patient verbalized understanding.  Glennie Hawk, CMA

## 2023-11-01 NOTE — Assessment & Plan Note (Signed)
 In afib today, rate controlled. Compliant on eliquis, continue. Continue to follow with cardiology.

## 2023-11-01 NOTE — Assessment & Plan Note (Addendum)
 Chronic, exacerbated since fracture 06/2022. With some weakness on exam, would benefit from PT, order for HHPT placed. Small amount of prn tramadol provided, only use for refractory pain. Continue tylenol, voltaren gel.

## 2023-11-01 NOTE — Telephone Encounter (Signed)
 She is to take three times daily. 1 tablet in morning and afternoon, 2 tablets 1 hour prior to bedtime. Sent new prescription with new directions to pharmacy.

## 2023-11-01 NOTE — Patient Instructions (Addendum)
 It was great to see you!  Our plans for today:  - We are referring you to Physical Therapy for your back and leg pain. Let us know if you don't hear about an appointment in the next few weeks.  - Try taking 2 pills (0.5mg ) of your ropinirole instead of 1 pill about 1 hour before sleep to see if this helps.  - Continue tylenol as needed for pain. Try voltaren (diclofenac) gel to help with pain. - Come back in about a month.  Take care and seek immediate care sooner if you develop any concerns.   Dr. Linwood Dibbles

## 2023-11-01 NOTE — Assessment & Plan Note (Addendum)
 With wide pulse pressure. Follows with cardiology. Some orthostatic symptoms, at goal for age today with varying SBP at home, no changes today. Consider reaching out to cardiology if persistent orthostatic symptoms for possible dose reduction. Reviewed recent labs.

## 2023-11-05 ENCOUNTER — Other Ambulatory Visit: Payer: Self-pay | Admitting: Family Medicine

## 2023-11-05 DIAGNOSIS — F4329 Adjustment disorder with other symptoms: Secondary | ICD-10-CM

## 2023-11-10 ENCOUNTER — Telehealth: Payer: Self-pay | Admitting: Family Medicine

## 2023-11-10 NOTE — Telephone Encounter (Signed)
 Called patient per Dr. Jorden Nevin request to inform her about the information Dr. Alverna John provided. Offered patient to set an appointment but Patient states that she is not able to come to the office.  Did inform patient that Dr. Rumball stated that if she unable to get seen in office, that she recommend urgent care evaluation.  Patient was okay with the information provided.  Christ Courier, CMA

## 2023-11-10 NOTE — Telephone Encounter (Signed)
 Patient calls requesting something be called in for her dizziness. Patient fell yesterday due to this. She states she's always had dizziness and Dr. Bronson Canny used to prescribe her something for it, but forgot the name of it. Offered patient an appointment, but patient says she has no way of getting here. Please reach out to patient to discuss.

## 2023-11-26 ENCOUNTER — Other Ambulatory Visit: Payer: Self-pay | Admitting: Cardiovascular Disease

## 2023-11-26 DIAGNOSIS — I480 Paroxysmal atrial fibrillation: Secondary | ICD-10-CM

## 2023-11-26 NOTE — Telephone Encounter (Signed)
 Prescription refill request for Eliquis  received. Indication:afib Last office visit:2/25 Scr:1.25  3/25 Age: 89 Weight:51 kg  Prescription refilled

## 2023-11-29 ENCOUNTER — Encounter: Payer: Self-pay | Admitting: Family Medicine

## 2023-12-02 ENCOUNTER — Encounter: Payer: Self-pay | Admitting: Family Medicine

## 2023-12-02 ENCOUNTER — Ambulatory Visit (INDEPENDENT_AMBULATORY_CARE_PROVIDER_SITE_OTHER): Admitting: Family Medicine

## 2023-12-02 VITALS — BP 144/80 | HR 88 | Ht 61.0 in | Wt 111.4 lb

## 2023-12-02 DIAGNOSIS — G8929 Other chronic pain: Secondary | ICD-10-CM

## 2023-12-02 DIAGNOSIS — G2581 Restless legs syndrome: Secondary | ICD-10-CM | POA: Diagnosis not present

## 2023-12-02 DIAGNOSIS — I5032 Chronic diastolic (congestive) heart failure: Secondary | ICD-10-CM

## 2023-12-02 DIAGNOSIS — M545 Low back pain, unspecified: Secondary | ICD-10-CM

## 2023-12-02 DIAGNOSIS — E039 Hypothyroidism, unspecified: Secondary | ICD-10-CM | POA: Diagnosis not present

## 2023-12-02 DIAGNOSIS — R531 Weakness: Secondary | ICD-10-CM | POA: Diagnosis not present

## 2023-12-02 DIAGNOSIS — K5730 Diverticulosis of large intestine without perforation or abscess without bleeding: Secondary | ICD-10-CM

## 2023-12-02 DIAGNOSIS — I480 Paroxysmal atrial fibrillation: Secondary | ICD-10-CM

## 2023-12-02 MED ORDER — ROPINIROLE HCL 1 MG PO TABS: 1.0000 mg | ORAL_TABLET | Freq: Every day | ORAL | 0 refills | Status: DC

## 2023-12-02 MED ORDER — ROPINIROLE HCL 0.25 MG PO TABS: ORAL_TABLET | ORAL | Status: DC

## 2023-12-02 NOTE — Assessment & Plan Note (Addendum)
 Uncontrolled.  Increase Requip  to 1 mg at bedtime, keep remainder of doses the same.  Watch BP.

## 2023-12-02 NOTE — Patient Instructions (Addendum)
 It was great to see you!  Our plans for today:  - Increase your requip  to 1mg  at bedtime. Keep your other doses the same.  - Once we hear about the status of your physical therapy referral, we will let you know.  - We are checking some labs today, we will release these results to your MyChart. - Come back in 1 month.  Take care and seek immediate care sooner if you develop any concerns.   Dr. Madhav Mohon

## 2023-12-02 NOTE — Assessment & Plan Note (Signed)
 In afib today, rate controlled.  With some fatigue, likely contributing.  Will check labs today.  Compliant on eliquis , continue. Continue to follow with cardiology.

## 2023-12-02 NOTE — Assessment & Plan Note (Addendum)
 Acute bleeding now resolved.  Will check CBC and iron  panel.  Discussed follow-up colonoscopy and GI referral but given prior results and current age, patient declines, believe this is reasonable.

## 2023-12-02 NOTE — Assessment & Plan Note (Signed)
 Recheck TSH

## 2023-12-02 NOTE — Assessment & Plan Note (Signed)
 Euvolemic today.

## 2023-12-02 NOTE — Progress Notes (Signed)
   SUBJECTIVE:   CHIEF COMPLAINT / HPI:   Back pain - seen previously 4/7.  Chronic, exacerbated since fracture 06/2022.  With quad weakness on previous exam, referred for home health PT.  Given short course of as needed tramadol  for refractory pain.  Continues with Tylenol  and Voltaren  gel.  Using tramadol  infrequently, unsure if it is helping.  Pain does not inhibit daily activities or sleep.  Has not heard about home health PT referral.  RLS - Increased Requip  to 2 pills at bedtime with 0.25 mg the other 2 doses during the day.  Has not noticed a difference.  Restless leg is still making it difficult to fall asleep.  Rectal bleeding - history of diverticulosis and hemorrhoids causing rectal bleeding in the past.  Prior colonoscopy 2007 with some polyps but were nonbleeding.  Rectal bleeding at that time thought to be due to diverticuli.  Does have history of anemia but most notably after recent hip surgery.  When she notices slight blood in stool she pauses her Eliquis  for a few days and then resumes once it stops bleeding.  Taking MiraLAX  regularly.  Has not noticed any changes in her stool.  OBJECTIVE:   BP (!) 144/80   Pulse 88   Ht 5\' 1"  (1.549 m)   Wt 111 lb 6.4 oz (50.5 kg)   SpO2 99%   BMI 21.05 kg/m   Gen: elderly. well appearing, in NAD Card: irregularly irregular rhythm, normal rate Lungs: CTAB Ext: WWP, no edema   ASSESSMENT/PLAN:   Paroxysmal atrial fibrillation (HCC) In afib today, rate controlled.  With some fatigue, likely contributing.  Will check labs today.  Compliant on eliquis , continue. Continue to follow with cardiology.  Chronic diastolic CHF (congestive heart failure) (HCC) Euvolemic today.  Diverticulosis of colon Acute bleeding now resolved.  Will check CBC and iron  panel.  Discussed follow-up colonoscopy and GI referral but given prior results and current age, patient declines, believe this is reasonable.  Hypothyroidism Recheck TSH  Weakness  generalized Will check on recent PT referral.  Restless leg syndrome Uncontrolled.  Increase Requip  to 1 mg at bedtime, keep remainder of doses the same.  Watch BP.  BACK PAIN, LOW Will check on recent PT referral.  Offered OMT, patient declines.  Continue current pain regimen.   Follow-up 1 month.  [name], DO

## 2023-12-02 NOTE — Assessment & Plan Note (Signed)
 Will check on recent PT referral.  Offered OMT, patient declines.  Continue current pain regimen.

## 2023-12-02 NOTE — Assessment & Plan Note (Signed)
 Will check on recent PT referral.

## 2023-12-03 ENCOUNTER — Encounter: Payer: Self-pay | Admitting: Family Medicine

## 2023-12-04 LAB — ANEMIA PANEL
Ferritin: 92 ng/mL (ref 15–150)
Hematocrit: 41.4 % (ref 34.0–46.6)
Iron Saturation: 21 % (ref 15–55)
Iron: 70 ug/dL (ref 27–139)
Retic Ct Pct: 1 % (ref 0.6–2.6)
Total Iron Binding Capacity: 326 ug/dL (ref 250–450)
UIBC: 256 ug/dL (ref 118–369)
Vitamin B-12: 472 pg/mL (ref 232–1245)

## 2023-12-04 LAB — CBC
Hemoglobin: 13.5 g/dL (ref 11.1–15.9)
MCH: 32 pg (ref 26.6–33.0)
MCHC: 32.6 g/dL (ref 31.5–35.7)
MCV: 98 fL — ABNORMAL HIGH (ref 79–97)
Platelets: 235 10*3/uL (ref 150–450)
RBC: 4.22 x10E6/uL (ref 3.77–5.28)
RDW: 13.4 % (ref 11.7–15.4)
WBC: 4.5 10*3/uL (ref 3.4–10.8)

## 2023-12-04 LAB — MAGNESIUM: Magnesium: 2.6 mg/dL — ABNORMAL HIGH (ref 1.6–2.3)

## 2023-12-04 LAB — TSH: TSH: 1.15 u[IU]/mL (ref 0.450–4.500)

## 2023-12-06 ENCOUNTER — Encounter: Payer: Self-pay | Admitting: Cardiovascular Disease

## 2023-12-06 NOTE — Telephone Encounter (Signed)
 error

## 2023-12-08 ENCOUNTER — Other Ambulatory Visit: Payer: Self-pay

## 2023-12-08 NOTE — Patient Outreach (Signed)
 Complex Care Management   Visit Note  12/08/2023  Name:  Jacqueline [name] MRN: [id number] DOB: 29-Sep-1930  Situation: Referral received for Complex Care Management related to HTN I obtained verbal consent from Patient.  Visit completed with patient  on the phone  Background:   Past Medical History:  Diagnosis Date   Abnormal ECG 10/22/2021   Arthritis    back   Atrial fibrillation with rapid ventricular response (HCC) 02/24/2014   a. CHA2DS2VASc = 6 -> on eliquis ;  b. 02/2014 s/p DCCV;  c. 08/2014 Echo: EF 55-60%, Gr 2 DD, mild MR, triv AI.   Bronchitis 10/22/2021   CAD (coronary artery disease)    a. s/p CABG x 2 (LIMA->LAD, VG->Diag);  b. 08/2014 Cath: LM nl, LAD 90p, LCX nl, RCA nl/dominant, LIMA->LAD atretic, VG->D2 patent w/ retrograde filling of LAD, EF 55-60%-->Med Rx.   Diverticulitis 02/21/2012   Elevated troponin 10/22/2021   GERD (gastroesophageal reflux disease)    Hyperlipemia    Hypertension    Insomnia    Vertigo 11/24/2018    Assessment: Patient Reported Symptoms:  Cognitive Cognitive Status: Able to follow simple commands, Alert and oriented to person, place, and time, Normal speech and language skills      Neurological Neurological Review of Symptoms: No symptoms reported    HEENT HEENT Symptoms Reported: No symptoms reported      Cardiovascular Cardiovascular Symptoms Reported: No symptoms reported Does patient have uncontrolled Hypertension?: No    Respiratory Respiratory Symptoms Reported: No symptoms reported    Endocrine Patient reports the following symptoms related to hypoglycemia or hyperglycemia : No symptoms reported    Gastrointestinal Gastrointestinal Symptoms Reported: No symptoms reported      Genitourinary Genitourinary Symptoms Reported: No symptoms reported    Integumentary Integumentary Symptoms Reported: No symptoms reported    Musculoskeletal Musculoskelatal Symptoms Reviewed: Difficulty walking, Unsteady gait, Weakness, Muscle  pain Musculoskeletal Conditions: Back pain Falls in the past year?: No    Psychosocial       Quality of Family Relationships: supportive Do you feel physically threatened by others?: No      12/08/2023    9:28 AM  Depression screen PHQ 2/9  Decreased Interest 0  Down, Depressed, Hopeless 0  PHQ - 2 Score 0    There were no vitals filed for this visit.  Medications Reviewed Today     Reviewed by Augustin Leber, RN (Registered Nurse) on 12/08/23 at [phone]  Med List Status: <None>   Medication Order Taking? Sig Documenting Provider Last Dose Status Informant  acetaminophen  (TYLENOL ) 325 MG tablet [id number] Yes Take 2 tablets (650 mg total) by mouth every 6 (six) hours. Dahbura, Anton, DO Taking Active   ALPRAZolam  (XANAX ) 0.5 MG tablet [id number] Yes TAKE 1 TABLET BY MOUTH AT BEDTIME AS NEEDED FOR ANXIETY Rumball, Alison M, DO Taking Active   amiodarone  (PACERONE ) 200 MG tablet [id number] Yes TAKE 1 TABLET BY MOUTH EVERY DAY Rumball, Alison M, DO Taking Active   amLODipine  (NORVASC ) 5 MG tablet [id number] Yes TAKE 1 TABLET (5 MG TOTAL) BY MOUTH DAILY. Croitoru, Mihai, MD Taking Active   apixaban  (ELIQUIS ) 2.5 MG TABS tablet [id number] No Take 1 tablet (2.5 mg total) by mouth 2 (two) times daily.  Patient not taking: Reported on 12/08/2023   Croitoru, Karyl Paget, MD Not Taking Active   apixaban  (ELIQUIS ) 2.5 MG TABS tablet [id number] No Take 1 tablet (2.5 mg total) by mouth 2 (two) times daily.  Patient not taking: Reported on 12/08/2023  Croitoru, Mihai, MD Not Taking Active   apixaban  (ELIQUIS ) 2.5 MG TABS tablet [id number] No Take 1 tablet (2.5 mg total) by mouth 2 (two) times daily.  Patient not taking: Reported on 12/08/2023   Jude Norton, NP Not Taking Active   cholecalciferol  (VITAMIN D3) 25 MCG (1000 UNIT) tablet [id number] Yes Take 1,000 Units by mouth daily. [provider] Taking Active Child  cycloSPORINE  (RESTASIS ) 0.05 % ophthalmic emulsion [id number] Yes Place 1 drop into  both eyes 2 (two) times daily. [provider] Taking Active Child  ELIQUIS  2.5 MG TABS tablet [id number] Yes TAKE 1 TABLET BY MOUTH TWICE A DAY Croitoru, Mihai, MD Taking Active   furosemide  (LASIX ) 20 MG tablet [id number] Yes Take 1 tablet (20 mg total) by mouth daily. Croitoru, Mihai, MD Taking Active   gabapentin  (NEURONTIN ) 100 MG capsule [id number] Yes TAKE 1 CAPSULE (100 MG TOTAL) BY MOUTH 3 TIMES A DAY Rumball, Jarome Merritt, DO Taking Active   hydrALAZINE  (APRESOLINE ) 25 MG tablet [id number]  Take 1 tablet (25 mg total) by mouth 3 (three) times daily as needed (may take up to three times daily for BP >160). Croitoru, Mihai, MD  Expired 08/30/23 2359   levothyroxine  (SYNTHROID ) 75 MCG tablet [id number] Yes Take 1 tablet (75 mcg total) by mouth daily. Rumball, Alison M, DO Taking Active   Multiple Vitamin (MULTIVITAMIN WITH MINERALS) TABS tablet [id number] Yes Take 1 tablet by mouth daily. [provider] Taking Active Child  nitroGLYCERIN  (NITROSTAT ) 0.4 MG SL tablet [id number] Yes Place 1 tablet (0.4 mg total) under the tongue every 5 (five) minutes as needed for chest pain. Croitoru, Mihai, MD Taking Active Child  Omega-3 Fatty Acids (FISH OIL) 1000 MG CAPS [id number]  Take 1,000 mg by mouth every evening. [provider]  Active Child           Med Note Audry Leavell Aug 21, 2015  8:48 AM)    omeprazole  (PRILOSEC) 20 MG capsule [id number] Yes TAKE 1 CAPSULE BY MOUTH EVERY DAY Rumball, Jarome Merritt, DO Taking Active   Polyethyl Glycol-Propyl Glycol (LUBRICANT EYE DROPS) 0.4-0.3 % SOLN [id number] Yes Place 1 drop into both eyes 4 (four) times daily. [provider] Taking Active Child           Med Note Nolan Battle, [place] May 26, 2020 12:02 AM)    polyethylene glycol (MIRALAX  / GLYCOLAX ) 17 g packet [id number] Yes Take 17 g by mouth 2 (two) times daily.  Patient taking differently: Take 17 g by mouth daily as needed for mild constipation.   Maylene Spear,  MD Taking Active Child  potassium chloride  SA (KLOR-CON  M) 20 MEQ tablet [id number] No Take 1 tablet (20 mEq total) by mouth daily.  Patient not taking: Reported on 08/30/2023   Ballard Bongo, MD Not Taking Active   pravastatin  (PRAVACHOL ) 40 MG tablet [id number] Yes Take 1 tablet (40 mg total) by mouth every evening. Croitoru, Mihai, MD Taking Active   rOPINIRole  (REQUIP ) 0.25 MG tablet [id number] Yes Take 1 tablet by mouth in the morning and afternoon. Rumball, Alison M, DO Taking Active   rOPINIRole  (REQUIP ) 1 MG tablet [id number] Yes Take 1 tablet (1 mg total) by mouth at bedtime. Rumball, Alison M, DO Taking Active   senna-docusate (SENOKOT-S) 8.6-50 MG tablet [id number] Yes Take 2 tablets by mouth 2 (two) times daily.  Patient taking differently: Take 2 tablets by mouth at bedtime as needed for mild constipation.  Maylene Spear, MD Taking Active Child  Med List Note Durwood Gilmore, CPhT 06/15/14 1203): Lenses in eyes            Recommendation:   PCP Follow-up  Follow Up Plan:   Telephone follow up appointment with care management team member scheduled for:  01/11/24  10 am  Augustin Leber RN, BSN, [facility] [place]  [facility], [facility]  Care Coordinator Phone: [phone]

## 2023-12-08 NOTE — Patient Instructions (Signed)
 Visit Information  Thank you for taking time to visit with me today. Please don't hesitate to contact me if I can be of assistance to you before our next scheduled appointment.  Your next care management appointment is scheduled for:  01/11/24  10 am   Please call the care guide team at [phone] if you need to cancel, schedule, or reschedule an appointment.   Please call 1-800-273-TALK (toll free, 24 hour hotline) if you are experiencing a Mental Health or Behavioral Health Crisis or need someone to talk to.   Augustin Leber RN, BSN, [facility] [place]  [facility], [facility]  Care Coordinator Phone: [phone]

## 2023-12-29 ENCOUNTER — Ambulatory Visit: Payer: PPO | Admitting: Cardiovascular Disease

## 2023-12-31 ENCOUNTER — Encounter: Payer: Self-pay | Admitting: Cardiovascular Disease

## 2023-12-31 ENCOUNTER — Ambulatory Visit: Payer: PPO | Attending: Cardiovascular Disease | Admitting: Cardiovascular Disease

## 2023-12-31 ENCOUNTER — Ambulatory Visit: Admitting: Family Medicine

## 2023-12-31 VITALS — BP 120/64 | HR 79 | Ht 61.0 in | Wt 113.8 lb

## 2023-12-31 DIAGNOSIS — I951 Orthostatic hypotension: Secondary | ICD-10-CM | POA: Diagnosis not present

## 2023-12-31 DIAGNOSIS — E7800 Pure hypercholesterolemia, unspecified: Secondary | ICD-10-CM | POA: Diagnosis not present

## 2023-12-31 DIAGNOSIS — I10 Essential (primary) hypertension: Secondary | ICD-10-CM | POA: Diagnosis not present

## 2023-12-31 DIAGNOSIS — Z5181 Encounter for therapeutic drug level monitoring: Secondary | ICD-10-CM | POA: Diagnosis not present

## 2023-12-31 DIAGNOSIS — R9431 Abnormal electrocardiogram [ECG] [EKG]: Secondary | ICD-10-CM

## 2023-12-31 DIAGNOSIS — I25728 Atherosclerosis of autologous artery coronary artery bypass graft(s) with other forms of angina pectoris: Secondary | ICD-10-CM

## 2023-12-31 DIAGNOSIS — Z79899 Other long term (current) drug therapy: Secondary | ICD-10-CM

## 2023-12-31 DIAGNOSIS — D6869 Other thrombophilia: Secondary | ICD-10-CM

## 2023-12-31 DIAGNOSIS — I480 Paroxysmal atrial fibrillation: Secondary | ICD-10-CM

## 2023-12-31 DIAGNOSIS — E785 Hyperlipidemia, unspecified: Secondary | ICD-10-CM

## 2023-12-31 DIAGNOSIS — N1832 Chronic kidney disease, stage 3b: Secondary | ICD-10-CM

## 2023-12-31 DIAGNOSIS — I5032 Chronic diastolic (congestive) heart failure: Secondary | ICD-10-CM | POA: Diagnosis not present

## 2023-12-31 DIAGNOSIS — I129 Hypertensive chronic kidney disease with stage 1 through stage 4 chronic kidney disease, or unspecified chronic kidney disease: Secondary | ICD-10-CM

## 2023-12-31 DIAGNOSIS — I2721 Secondary pulmonary arterial hypertension: Secondary | ICD-10-CM

## 2023-12-31 LAB — COMPREHENSIVE METABOLIC PANEL WITH GFR
ALT: 28 IU/L (ref 0–32)
AST: 32 IU/L (ref 0–40)
Albumin: 4.5 g/dL (ref 3.6–4.6)
Alkaline Phosphatase: 99 IU/L (ref 44–121)
BUN/Creatinine Ratio: 16 (ref 12–28)
BUN: 28 mg/dL (ref 10–36)
Bilirubin Total: 0.3 mg/dL (ref 0.0–1.2)
CO2: 25 mmol/L (ref 20–29)
Calcium: 9.6 mg/dL (ref 8.7–10.3)
Chloride: 99 mmol/L (ref 96–106)
Creatinine, Ser: 1.74 mg/dL — ABNORMAL HIGH (ref 0.57–1.00)
Globulin, Total: 2.3 g/dL (ref 1.5–4.5)
Glucose: 79 mg/dL (ref 70–99)
Potassium: 4.4 mmol/L (ref 3.5–5.2)
Sodium: 142 mmol/L (ref 134–144)
Total Protein: 6.8 g/dL (ref 6.0–8.5)
eGFR: 27 mL/min/{1.73_m2} — ABNORMAL LOW (ref >59)

## 2023-12-31 LAB — LIPID PANEL
Chol/HDL Ratio: 3.6 ratio (ref 0.0–4.4)
Cholesterol, Total: 174 mg/dL (ref 100–199)
HDL: 49 mg/dL (ref >39)
LDL Chol Calc (NIH): 97 mg/dL (ref 0–99)
Triglycerides: 159 mg/dL — ABNORMAL HIGH (ref 0–149)
VLDL Cholesterol Cal: 28 mg/dL (ref 5–40)

## 2023-12-31 NOTE — Patient Instructions (Signed)
 Medication Instructions:  Your physician recommends that you continue on your current medications as directed. Please refer to the Current Medication list given to you today.    *If you need a refill on your cardiac medications before your next appointment, please call your pharmacy*   Lab Work: LIPID PANEL  CMET    If you have labs (blood work) drawn today and your tests are completely normal, you will receive your results only by: MyChart Message (if you have MyChart) OR A paper copy in the mail If you have any lab test that is abnormal or we need to change your treatment, we will call you to review the results.   Testing/Procedures: NONE    Follow-Up: At [facility], you and your health needs are our priority.  As part of our continuing mission to provide you with exceptional heart care, we have created designated Provider Care Teams.  These Care Teams include your primary Cardiologist (physician) and Advanced Practice Providers (APPs -  Physician Assistants and Nurse Practitioners) who all work together to provide you with the care you need, when you need it.  We recommend signing up for the patient portal called "MyChart".  Sign up information is provided on this After Visit Summary.  MyChart is used to connect with patients for Virtual Visits (Telemedicine).  Patients are able to view lab/test results, encounter notes, upcoming appointments, etc.  Non-urgent messages can be sent to your provider as well.   To learn more about what you can do with MyChart, go to [email or web address].    Your next appointment:   1 year(s)  The format for your next appointment:   In Person  Provider:   Luana Rumple, MD   Other Instructions

## 2023-12-31 NOTE — Progress Notes (Signed)
 Cardiology Office Note    Date:  12/31/2023   ID:  Malinda, Mayden 28-Apr-1931, MRN [id number]  PCP:  [name], DO  Cardiologist:  Bienvenido Proehl Electrophysiologist:  None   Evaluation Performed:  Follow-Up Visit  Chief Complaint:  F/U AFib  History of Present Illness:    Jacqueline [name] is a 89 y.o. female with coronary artery disease and previous bypass surgery (2006), paroxysmal atrial fibrillation requiring previous cardioversion, diastolic heart failure, HTN.  She has done quite well since her last appointment.  She has not had any noticeable palpitations, although her PCP often tells her that her rhythm is irregular.  She does have frequent PACs on exam today.  She has not had dizziness syncope or any new falls.  She remains quite sedentary.  She does not have angina or chest pain either at rest or with her light activity.  She has not had any bleeding problems or focal neurological complaints.  In the past her falls have been related to toppling over when she has been changing position.  This often occurred when we were aggressively trying to normalize her blood pressure.  She is on chronic amiodarone  therapy.  Recently she had normal thyroid  function tests, but her liver tests have not been rechecked since last December.  She is also soon due to have a lipid profile.  She had a protracted episode of persistent atrial fibrillation in summer 2020 on a low-dose of amiodarone .  The dose of amiodarone  was increased back to 200 mg daily but before she could undergo cardioversion she presented with rectal bleeding due to hemorrhoids and diverticulosis which required an interruption in her anticoagulants.  She eventually underwent TEE guided cardioversion on January 30, 2019.  She had transient atrial fibrillation during urinary tract infection in October 2021.    Past Medical History:  Diagnosis Date   Abnormal ECG 10/22/2021   Arthritis    back   Atrial fibrillation with  rapid ventricular response (HCC) 02/24/2014   a. CHA2DS2VASc = 6 -> on eliquis ;  b. 02/2014 s/p DCCV;  c. 08/2014 Echo: EF 55-60%, Gr 2 DD, mild MR, triv AI.   Bronchitis 10/22/2021   CAD (coronary artery disease)    a. s/p CABG x 2 (LIMA->LAD, VG->Diag);  b. 08/2014 Cath: LM nl, LAD 90p, LCX nl, RCA nl/dominant, LIMA->LAD atretic, VG->D2 patent w/ retrograde filling of LAD, EF 55-60%-->Med Rx.   Diverticulitis 02/21/2012   Elevated troponin 10/22/2021   GERD (gastroesophageal reflux disease)    Hyperlipemia    Hypertension    Insomnia    Vertigo 11/24/2018   Past Surgical History:  Procedure Laterality Date   ABDOMINAL HYSTERECTOMY  1975   CARDIAC CATHETERIZATION N/A 05/09/2015   Procedure: Right Heart Cath;  Surgeon: Darlis Eisenmenger, MD;  Location: [facility] INVASIVE CV LAB;  Service: Cardiovascular;  Laterality: N/A;   CARDIOVERSION N/A 02/28/2014   Procedure: CARDIOVERSION;  Surgeon: Luana Rumple, MD;  Location: [facility] ENDOSCOPY;  Service: Cardiovascular;  Laterality: N/A;   CARDIOVERSION N/A 01/30/2019   Procedure: CARDIOVERSION;  Surgeon: Elmyra Haggard, MD;  Location: [facility] ENDOSCOPY;  Service: Cardiovascular;  Laterality: N/A;   CATARACT EXTRACTION Bilateral 2014   with lens implanted   COLONOSCOPY N/A 02/19/2015   Procedure: COLONOSCOPY;  Surgeon: Asencion Blacksmith, MD;  Location: [facility] ENDOSCOPY;  Service: Endoscopy;  Laterality: N/A;   CORONARY ARTERY BYPASS GRAFT  2006   off-pump bypass surgery with LIMA to the LAD and SVG to the second  diagonal artery ( performed by Dr Nicanor Barge)    FEMUR IM NAIL Right 07/18/2022   Procedure: INTRAMEDULLARY (IM) NAIL, HIP;  Surgeon: Saundra Curl, MD;  Location: [facility] OR;  Service: Orthopedics;  Laterality: Right;   JOINT REPLACEMENT Bilateral    L-2004, R-2006   LEFT HEART CATHETERIZATION WITH CORONARY/GRAFT ANGIOGRAM N/A 09/17/2014   Procedure: LEFT HEART CATHETERIZATION WITH Estella Helling;  Surgeon: Millicent Ally, MD;  Location: [facility] CATH LAB;   Service: Cardiovascular;  Laterality: N/A;   REIMPLANTATION OF TOTAL KNEE Right 10/08/2014   Procedure: REIMPLANTATION OF RIGHT TOTAL KNEE ARTHROPLASTY WITH REMOVAL OF ANTIBIOTIC SPACER;  Surgeon: Claiborne Crew, MD;  Location: [facility] ORS;  Service: Orthopedics;  Laterality: Right;   ROTATOR CUFF REPAIR Bilateral    r-1999, l- 2005   TEE WITHOUT CARDIOVERSION N/A 02/28/2014   Procedure: TRANSESOPHAGEAL ECHOCARDIOGRAM (TEE);  Surgeon: Luana Rumple, MD;  Location: [facility] ENDOSCOPY;  Service: Cardiovascular;  Laterality: N/A;   TEE WITHOUT CARDIOVERSION N/A 01/30/2019   Procedure: TRANSESOPHAGEAL ECHOCARDIOGRAM (TEE);  Surgeon: Elmyra Haggard, MD;  Location: [facility] ENDOSCOPY;  Service: Cardiovascular;  Laterality: N/A;   TOTAL KNEE ARTHROPLASTY Right 07/02/2014   Procedure: Resection of Infected Right Total Knee Arthroplasty with placement antibiotic spacer;  Surgeon: Bevin Bucks, MD;  Location: [facility] ORS;  Service: Orthopedics;  Laterality: Right;     Current Meds  Medication Sig   acetaminophen  (TYLENOL ) 325 MG tablet Take 2 tablets (650 mg total) by mouth every 6 (six) hours. (Patient taking differently: Take 650 mg by mouth in the morning and at bedtime.)   ALPRAZolam  (XANAX ) 0.5 MG tablet TAKE 1 TABLET BY MOUTH AT BEDTIME AS NEEDED FOR ANXIETY   amiodarone  (PACERONE ) 200 MG tablet TAKE 1 TABLET BY MOUTH EVERY DAY   amLODipine  (NORVASC ) 5 MG tablet TAKE 1 TABLET (5 MG TOTAL) BY MOUTH DAILY.   apixaban  (ELIQUIS ) 2.5 MG TABS tablet Take 1 tablet (2.5 mg total) by mouth 2 (two) times daily.   cholecalciferol  (VITAMIN D3) 25 MCG (1000 UNIT) tablet Take 1,000 Units by mouth daily.   cycloSPORINE  (RESTASIS ) 0.05 % ophthalmic emulsion Place 1 drop into both eyes 2 (two) times daily.   ELIQUIS  2.5 MG TABS tablet TAKE 1 TABLET BY MOUTH TWICE A DAY   furosemide  (LASIX ) 20 MG tablet Take 1 tablet (20 mg total) by mouth daily.   gabapentin  (NEURONTIN ) 100 MG capsule TAKE 1 CAPSULE (100 MG TOTAL) BY MOUTH 3 TIMES A DAY    levothyroxine  (SYNTHROID ) 75 MCG tablet Take 1 tablet (75 mcg total) by mouth daily.   Multiple Vitamin (MULTIVITAMIN WITH MINERALS) TABS tablet Take 1 tablet by mouth daily.   Omega-3 Fatty Acids (FISH OIL) 1000 MG CAPS Take 1,000 mg by mouth every evening.   omeprazole  (PRILOSEC) 20 MG capsule TAKE 1 CAPSULE BY MOUTH EVERY DAY   Polyethyl Glycol-Propyl Glycol (LUBRICANT EYE DROPS) 0.4-0.3 % SOLN Place 1 drop into both eyes 4 (four) times daily.   potassium chloride  SA (KLOR-CON  M) 20 MEQ tablet Take 1 tablet (20 mEq total) by mouth daily.   pravastatin  (PRAVACHOL ) 40 MG tablet Take 1 tablet (40 mg total) by mouth every evening.   rOPINIRole  (REQUIP ) 0.25 MG tablet Take 1 tablet by mouth in the morning and afternoon.   rOPINIRole  (REQUIP ) 1 MG tablet Take 1 tablet (1 mg total) by mouth at bedtime.     Allergies:   Atorvastatin, Beta adrenergic blockers, Crestor [rosuvastatin], Morphine and codeine, Morphine sulfate, and Prednisone    Social History  Tobacco Use   Smoking status: Never   Smokeless tobacco: Never  Vaping Use   Vaping status: Never Used  Substance Use Topics   Alcohol  use: No   Drug use: No     Family Hx: The patient's family history includes Arthritis in her brother; Cancer in her brother; Heart attack in her brother; Parkinson's disease in her mother. There is no history of Breast cancer.  ROS:   Please see the history of present illness.    All other systems are reviewed and are negative.  Prior CV studies:   The following studies were reviewed today:  Notes from visit with Dr. Bronson Canny on 06/10/2020  Echocardiogram 10/23/2021  1. Left ventricular ejection fraction, by estimation, is 60 to 65%. The  left ventricle has normal function. The left ventricle has no regional  wall motion abnormalities. There is mild left ventricular hypertrophy.  Left ventricular diastolic parameters  are indeterminate.   2. Right ventricular systolic function is normal. The  right ventricular  size is normal.   3. The mitral valve is normal in structure. Trivial mitral valve  regurgitation. No evidence of mitral stenosis.   4. The aortic valve is tricuspid. Aortic valve regurgitation is trivial.  Aortic valve sclerosis is present, with no evidence of aortic valve  stenosis.   5. The inferior vena cava is normal in size with greater than 50%  respiratory variability, suggesting right atrial pressure of 3 mmHg.    Labs/Other Tests and Data Reviewed:    EKG: Personally reviewed the tracing from 08/30/2023 which shows sinus rhythm with a single PAC and delayed anterior R wave progression QTc 452 ms  EKG Interpretation Date/Time:    Ventricular Rate:    PR Interval:    QRS Duration:    QT Interval:    QTC Calculation:   R Axis:      Text Interpretation:          Recent Labs: 07/13/2023: ALT 30 08/25/2023: B Natriuretic Peptide 297.6 10/15/2023: BUN 18; Creatinine, Ser 1.25; Potassium 4.9; Sodium 141 12/02/2023: Hemoglobin 13.5; Magnesium  2.6; Platelets 235; TSH 1.150   Recent Lipid Panel Lab Results  Component Value Date/Time   CHOL 153 01/18/2023 11:57 AM   TRIG 147 01/18/2023 11:57 AM   HDL 50 01/18/2023 11:57 AM   CHOLHDL 3.1 01/18/2023 11:57 AM   CHOLHDL 2.8 04/08/2016 11:53 AM   LDLCALC 78 01/18/2023 11:57 AM    Wt Readings from Last 3 Encounters:  12/31/23 51.6 kg  12/02/23 50.5 kg  11/01/23 51 kg     Objective:    Vital Signs:  BP 120/64 (BP Location: Left Arm, Patient Position: Sitting, Cuff Size: Normal)   Pulse 79   Ht 5\' 1"  (1.549 m)   Wt 51.6 kg   SpO2 96%   BMI 21.50 kg/m      General: Alert, oriented x3, no distress, elderly and a little frail-appearing Head: no evidence of trauma, PERRL, EOMI, no exophtalmos or lid lag, no myxedema, no xanthelasma; normal ears, nose and oropharynx Neck: normal jugular venous pulsations and no hepatojugular reflux; brisk carotid pulses without delay and no carotid bruits Chest:  clear to auscultation, no signs of consolidation by percussion or palpation, normal fremitus, symmetrical and full respiratory excursions Cardiovascular: normal position and quality of the apical impulse, regular rhythm with occasional premature beats, normal first and second heart sounds, no murmurs, rubs or gallops Abdomen: no tenderness or distention, no masses by palpation, no abnormal pulsatility or arterial bruits,  normal bowel sounds, no hepatosplenomegaly Extremities: no clubbing, cyanosis or edema; 2+ radial, ulnar and brachial pulses bilaterally; 2+ right femoral, posterior tibial and dorsalis pedis pulses; 2+ left femoral, posterior tibial and dorsalis pedis pulses; no subclavian or femoral bruits Neurological: grossly nonfocal Psych: Normal mood and affect     ASSESSMENT & PLAN:    1. Paroxysmal atrial fibrillation (HCC)   2. Hyperlipidemia LDL goal <70   3. Acquired thrombophilia (HCC)   4. Encounter for monitoring amiodarone  therapy   5. Coronary artery disease of autologous bypass graft with stable angina pectoris (HCC)   6. Chronic diastolic CHF (congestive heart failure) (HCC)   7. PAH (pulmonary artery hypertension) (HCC)   8. Hypertensive kidney disease with stage 3b chronic kidney disease (HCC)   9. Hypercholesterolemia   10. Orthostatic hypotension   11. Essential hypertension   12. Long QT interval        AFib/flutter: She has occasional PVCs but she is asymptomatic.  No clinically evident recurrences ever since we increased the amiodarone  to 200 mg daily about 4 years ago.  She had a lengthy symptomatic episode in 2020 coinciding with GI bleeding, recurrent symptomatic episode of urinary tract infection in 2021. CHADSVasc 6 (age 68, gender, CAD, CHF, HTN).  On anticoagulation. Anticoagulation: Dose adjusted for age and small body size.  No recent falls and no bleeding issues. Amiodarone : Tolerating this well.  Had recurrent atrial fibrillation on 100 mg daily but  had symptomatic bradycardia on the 400 mg daily dose.   Recent normal TSH.  Due for repeat LFTs. CAD: On amlodipine  as her only antianginal, no recent complaints of chest pain.  Risk factors are addressed. CHF: Clinically euvolemic, NYHA functional class I-II.  Medical therapy limited by her risk of hypotension.  Avoiding SGLT2 inhibitors due to frequent urinary tract infections. PAH: Currently denies shortness of breath.  Most likely WHO group 2 due to heart failure. CKD: Stable creatinine 1.25 (range in the last several months has been 1.1-1.5). HLP: LDL was most recently 78 last June, little higher than our target, recheck today. Orthostatic hypotension/HTN: Recently asymptomatic.  Well-controlled today.  Avoid excessive reduction in systolic blood pressure.  She is prone to orthostatic hypotension and has a rather low diastolic blood pressure.  Aggressive treatment of her blood pressure has led to increased frequency of falls. UTI: Frequent recurrence, currently asymptomatic.  Long QT interval: prolonged in the past probably due to concomitant treatment with azithromycin .  Macrolides and other medications that can prolong the QT interval should be avoided if possible.    Patient Instructions  Medication Instructions:  Your physician recommends that you continue on your current medications as directed. Please refer to the Current Medication list given to you today.    *If you need a refill on your cardiac medications before your next appointment, please call your pharmacy*   Lab Work: LIPID PANEL  CMET    If you have labs (blood work) drawn today and your tests are completely normal, you will receive your results only by: MyChart Message (if you have MyChart) OR A paper copy in the mail If you have any lab test that is abnormal or we need to change your treatment, we will call you to review the results.   Testing/Procedures: NONE    Follow-Up: At [facility], you and your  health needs are our priority.  As part of our continuing mission to provide you with exceptional heart care, we have created designated Provider Care Teams.  These Care Teams include your primary Cardiologist (physician) and Advanced Practice Providers (APPs -  Physician Assistants and Nurse Practitioners) who all work together to provide you with the care you need, when you need it.  We recommend signing up for the patient portal called "MyChart".  Sign up information is provided on this After Visit Summary.  MyChart is used to connect with patients for Virtual Visits (Telemedicine).  Patients are able to view lab/test results, encounter notes, upcoming appointments, etc.  Non-urgent messages can be sent to your provider as well.   To learn more about what you can do with MyChart, go to [email or web address].    Your next appointment:   1 year(s)  The format for your next appointment:   In Person  Provider:   Luana Rumple, MD   Other Instructions     Signed, Luana Rumple, MD  12/31/2023 10:54 AM    [facility]

## 2024-01-01 ENCOUNTER — Ambulatory Visit: Payer: Self-pay | Admitting: Cardiovascular Disease

## 2024-01-01 DIAGNOSIS — R7989 Other specified abnormal findings of blood chemistry: Secondary | ICD-10-CM

## 2024-01-03 ENCOUNTER — Encounter: Payer: Self-pay | Admitting: Family Medicine

## 2024-01-03 ENCOUNTER — Ambulatory Visit (INDEPENDENT_AMBULATORY_CARE_PROVIDER_SITE_OTHER): Admitting: Family Medicine

## 2024-01-03 VITALS — BP 120/78 | HR 67 | Ht 61.0 in | Wt 113.0 lb

## 2024-01-03 DIAGNOSIS — M545 Low back pain, unspecified: Secondary | ICD-10-CM

## 2024-01-03 DIAGNOSIS — G2581 Restless legs syndrome: Secondary | ICD-10-CM | POA: Diagnosis not present

## 2024-01-03 DIAGNOSIS — I2510 Atherosclerotic heart disease of native coronary artery without angina pectoris: Secondary | ICD-10-CM | POA: Diagnosis not present

## 2024-01-03 DIAGNOSIS — I5033 Acute on chronic diastolic (congestive) heart failure: Secondary | ICD-10-CM | POA: Diagnosis not present

## 2024-01-03 DIAGNOSIS — G8929 Other chronic pain: Secondary | ICD-10-CM | POA: Diagnosis not present

## 2024-01-03 DIAGNOSIS — N1831 Chronic kidney disease, stage 3a: Secondary | ICD-10-CM | POA: Diagnosis not present

## 2024-01-03 DIAGNOSIS — I129 Hypertensive chronic kidney disease with stage 1 through stage 4 chronic kidney disease, or unspecified chronic kidney disease: Secondary | ICD-10-CM

## 2024-01-03 NOTE — Assessment & Plan Note (Signed)
 Recent increase in Cr, will recheck to ensure improvement.

## 2024-01-03 NOTE — Assessment & Plan Note (Signed)
 Improved. Continue to monitor.

## 2024-01-03 NOTE — Assessment & Plan Note (Signed)
 Improved with increase in requip , continue current therapy.

## 2024-01-03 NOTE — Progress Notes (Signed)
   SUBJECTIVE:   CHIEF COMPLAINT / HPI:   RLS - making difficult to fall asleep. Increased requip  to 1mg  at bedtime at last visit, remainder of 0.25mg  doses the same. Iron  wnl previously.   Back pain - referred to PT previously, HHPT came out once. States she doesn't want to continue. States she is doing well, pain is bearable.  OBJECTIVE:   BP 120/78   Pulse 67   Ht 5\' 1"  (1.549 m)   Wt 113 lb (51.3 kg)   SpO2 97%   BMI 21.35 kg/m   Gen: well appearing, in NAD Card: RRR Lungs: CTAB Ext: WWP, no edema   ASSESSMENT/PLAN:   Hypertensive kidney disease with CKD stage III (HCC) Recent increase in Cr, will recheck to ensure improvement.  Restless leg syndrome Improved with increase in requip , continue current therapy.  BACK PAIN, LOW Improved. Continue to monitor.     [name], DO

## 2024-01-03 NOTE — Patient Instructions (Signed)
 It was great to see you!  Our plans for today:  - No changes to your medications.   - Come back in 6 months.   We are checking some labs today, we will release these results to your MyChart.  Take care and seek immediate care sooner if you develop any concerns.   Dr. Hana Trippett

## 2024-01-04 ENCOUNTER — Ambulatory Visit: Payer: Self-pay | Admitting: Family Medicine

## 2024-01-04 LAB — BASIC METABOLIC PANEL WITH GFR
BUN/Creatinine Ratio: 16 (ref 12–28)
BUN: 25 mg/dL (ref 10–36)
CO2: 21 mmol/L (ref 20–29)
Calcium: 9.8 mg/dL (ref 8.7–10.3)
Chloride: 98 mmol/L (ref 96–106)
Creatinine, Ser: 1.57 mg/dL — ABNORMAL HIGH (ref 0.57–1.00)
Glucose: 81 mg/dL (ref 70–99)
Potassium: 4.8 mmol/L (ref 3.5–5.2)
Sodium: 139 mmol/L (ref 134–144)
eGFR: 31 mL/min/{1.73_m2} — ABNORMAL LOW (ref >59)

## 2024-01-04 NOTE — Telephone Encounter (Signed)
 Jacqueline [name], Jacqueline Paget, MD to [name], DO  Me  (Selected Message)    01/01/24  4:15 PM Result Note Liver tests (checked for amiodarone ) are normal. Kidney tests are a little worse than before. Please make sure you drink enough water. Cholesterol is higher than in years past. Please make sure to take the pravastatin  every day and to take it in the evening, either with dinner or at bedtime. It is only effective if taken at the end of the day. I would recommend rechecking the kidney function tests in a month or so.   Called the patient and gave the information above. Order placed for CMP and released. Pt will have drawn in one month. She verbalized understanding of all information.

## 2024-01-11 ENCOUNTER — Other Ambulatory Visit: Payer: Self-pay

## 2024-01-11 VITALS — BP 112/80 | HR 79 | Wt 113.0 lb

## 2024-01-11 NOTE — Patient Outreach (Signed)
 Complex Care Management   Visit Note  01/11/2024  Name:  Jacqueline [name] MRN: [id number] DOB: 02-16-1931  Situation: Referral received for Complex Care Management related to Atrial Fibrillation and HTN I obtained verbal consent from Patient.  Visit completed with patient  on the phone  Background:   Past Medical History:  Diagnosis Date   Abnormal ECG 10/22/2021   Arthritis    back   Atrial fibrillation with rapid ventricular response (HCC) 02/24/2014   a. CHA2DS2VASc = 6 -> on eliquis ;  b. 02/2014 s/p DCCV;  c. 08/2014 Echo: EF 55-60%, Gr 2 DD, mild MR, triv AI.   Bronchitis 10/22/2021   CAD (coronary artery disease)    a. s/p CABG x 2 (LIMA->LAD, VG->Diag);  b. 08/2014 Cath: LM nl, LAD 90p, LCX nl, RCA nl/dominant, LIMA->LAD atretic, VG->D2 patent w/ retrograde filling of LAD, EF 55-60%-->Med Rx.   Diverticulitis 02/21/2012   Elevated troponin 10/22/2021   GERD (gastroesophageal reflux disease)    Hyperlipemia    Hypertension    Insomnia    Vertigo 11/24/2018    Assessment: Patient Reported Symptoms:  Cognitive Cognitive Status: Able to follow simple commands, Alert and oriented to person, place, and time, Normal speech and language skills      Neurological Neurological Review of Symptoms: No symptoms reported    HEENT HEENT Symptoms Reported: No symptoms reported      Cardiovascular Cardiovascular Symptoms Reported: Dizziness Does patient have uncontrolled Hypertension?: No Cardiovascular Conditions: High blood cholesterol, Hypertension, Heart failure Cardiovascular Management Strategies: Medication therapy, Medical device Weight: 113 lb (51.3 kg)  Respiratory Respiratory Symptoms Reported: No symptoms reported    Endocrine Patient reports the following symptoms related to hypoglycemia or hyperglycemia : No symptoms reported    Gastrointestinal Gastrointestinal Symptoms Reported: Abdominal pain or discomfort Gastrointestinal Conditions: Abdominal  pain Gastrointestinal Management Strategies:  (patient is unsure of origin)    Genitourinary Genitourinary Symptoms Reported: No symptoms reported    Integumentary Integumentary Symptoms Reported: Bruising Skin Conditions: Other Other Skin Conditions: thin skin and eliquis  that she takes  Musculoskeletal Musculoskelatal Symptoms Reviewed: Difficulty walking, Unsteady gait, Muscle pain Musculoskeletal Conditions: Back pain, Unsteady gait Musculoskeletal Management Strategies: Medical device, Medication therapy Falls in the past year?: No    Psychosocial       Quality of Family Relationships: involved, supportive Do you feel physically threatened by others?: No      01/11/2024   10:36 AM  Depression screen PHQ 2/9  Decreased Interest 0  Down, Depressed, Hopeless 0  PHQ - 2 Score 0    Vitals:   01/11/24 1020  BP: 112/80  Pulse: 79    Medications Reviewed Today     Reviewed by Augustin Leber, RN (Registered Nurse) on 01/11/24 at 1025  Med List Status: <None>   Medication Order Taking? Sig Documenting Provider Last Dose Status Informant  acetaminophen  (TYLENOL ) 325 MG tablet [id number] Yes Take 2 tablets (650 mg total) by mouth every 6 (six) hours. Dahbura, Anton, DO  Active   ALPRAZolam  (XANAX ) 0.5 MG tablet [id number] Yes TAKE 1 TABLET BY MOUTH AT BEDTIME AS NEEDED FOR ANXIETY Rumball, Alison M, DO  Active   amiodarone  (PACERONE ) 200 MG tablet [id number] Yes TAKE 1 TABLET BY MOUTH EVERY DAY Rumball, Alison M, DO  Active   amLODipine  (NORVASC ) 5 MG tablet [id number] Yes TAKE 1 TABLET (5 MG TOTAL) BY MOUTH DAILY. Croitoru, Mihai, MD  Active   apixaban  (ELIQUIS ) 2.5 MG TABS tablet [id number] Yes Take 1 tablet (2.5 mg  total) by mouth 2 (two) times daily. Croitoru, Mihai, MD  Active   apixaban  (ELIQUIS ) 2.5 MG TABS tablet [id number]  Take 1 tablet (2.5 mg total) by mouth 2 (two) times daily. Croitoru, Mihai, MD  Active   apixaban  (ELIQUIS ) 2.5 MG TABS tablet [id number]  Take 1 tablet (2.5  mg total) by mouth 2 (two) times daily. Jude Norton, NP  Active   cholecalciferol  (VITAMIN D3) 25 MCG (1000 UNIT) tablet [id number] Yes Take 1,000 Units by mouth daily. [provider]  Active Child  cycloSPORINE  (RESTASIS ) 0.05 % ophthalmic emulsion [id number] Yes Place 1 drop into both eyes 2 (two) times daily. [provider]  Active Child  ELIQUIS  2.5 MG TABS tablet [id number]  TAKE 1 TABLET BY MOUTH TWICE A DAY Croitoru, Mihai, MD  Active   furosemide  (LASIX ) 20 MG tablet [id number] Yes Take 1 tablet (20 mg total) by mouth daily. Croitoru, Mihai, MD  Active   gabapentin  (NEURONTIN ) 100 MG capsule [id number] Yes TAKE 1 CAPSULE (100 MG TOTAL) BY MOUTH 3 TIMES A DAY Rumball, Alison M, DO  Active   hydrALAZINE  (APRESOLINE ) 25 MG tablet [id number]  Take 1 tablet (25 mg total) by mouth 3 (three) times daily as needed (may take up to three times daily for BP >160).  Patient not taking: Reported on 01/11/2024   Croitoru, Karyl Paget, MD  Expired 08/30/23 2359   levothyroxine  (SYNTHROID ) 75 MCG tablet [id number] Yes Take 1 tablet (75 mcg total) by mouth daily. Rumball, Alison M, DO  Active   Multiple Vitamin (MULTIVITAMIN WITH MINERALS) TABS tablet [id number] Yes Take 1 tablet by mouth daily. [provider]  Active Child  nitroGLYCERIN  (NITROSTAT ) 0.4 MG SL tablet [id number] Yes Place 1 tablet (0.4 mg total) under the tongue every 5 (five) minutes as needed for chest pain. Croitoru, Karyl Paget, MD  Active Child           Med Note Coit Dasen, Felizardo Hotter   Fri Dec 31, 2023  9:17 AM) Patient need a refill  Omega-3 Fatty Acids (FISH OIL) 1000 MG CAPS [id number] Yes Take 1,000 mg by mouth every evening. [provider]  Active Child           Med Note Audry Leavell Aug 21, 2015  8:48 AM)    omeprazole  (PRILOSEC) 20 MG capsule [id number] Yes TAKE 1 CAPSULE BY MOUTH EVERY DAY Rumball, Alison M, DO  Active   Polyethyl Glycol-Propyl Glycol (LUBRICANT EYE DROPS) 0.4-0.3 % SOLN [id number] Yes  Place 1 drop into both eyes 4 (four) times daily. [provider]  Active Child           Med Note Nolan Battle, [place] May 26, 2020 12:02 AM)    polyethylene glycol (MIRALAX  / GLYCOLAX ) 17 g packet [id number] Yes Take 17 g by mouth 2 (two) times daily.  Patient taking differently: Take 17 g by mouth 2 (two) times daily. Patient takes it as needed   Krishnan, Gokul, MD  Active Child           Med Note Coit Dasen, Felizardo Hotter   Fri Dec 31, 2023  9:16 AM) Patient takes as needed  potassium chloride  SA (KLOR-CON  M) 20 MEQ tablet [id number] Yes Take 1 tablet (20 mEq total) by mouth daily. Ballard Bongo, MD  Active   pravastatin  (PRAVACHOL ) 40 MG tablet [id number] Yes Take 1 tablet (40 mg total) by mouth every evening. Croitoru, Mihai, MD  Active   rOPINIRole  (REQUIP ) 0.25  MG tablet [id number]  Take 1 tablet by mouth in the morning and afternoon. Rumball, Alison M, DO  Active   rOPINIRole  (REQUIP ) 1 MG tablet [id number] Yes Take 1 tablet (1 mg total) by mouth at bedtime. Rumball, Alison M, DO  Active   Med List Note Durwood Gilmore, CPhT 06/15/14 1203): Lenses in eyes            Recommendation:   PCP Follow-up  Follow Up Plan:   Telephone follow up appointment date/time:  02/11/2024  Augustin Leber RN, BSN, [facility] [place]  [facility], [facility]   Care Coordinator Phone: [phone]

## 2024-01-11 NOTE — Patient Instructions (Signed)
 Visit Information  Thank you for taking time to visit with me today. Please don't hesitate to contact me if I can be of assistance to you before our next scheduled appointment.  Your next care management appointment is by telephone on 02/11/2024 at 10 am  Telephone follow up appointment date/time:  02/11/2024  10 am  Please call the care guide team at [phone] if you need to cancel, schedule, or reschedule an appointment.   Please call 1-800-273-TALK (toll free, 24 hour hotline) call 911 if you are experiencing a Mental Health or Behavioral Health Crisis or need someone to talk to.  Augustin Leber RN, BSN, [facility] [place]  [facility], [facility] Health   Care Coordinator Phone: [phone]

## 2024-01-11 NOTE — Addendum Note (Signed)
 Addended by: Koreen Person on: 01/11/2024 09:46 PM   Modules accepted: Orders

## 2024-01-12 NOTE — Progress Notes (Signed)
 This encounter was created in error - please disregard.

## 2024-01-19 ENCOUNTER — Telehealth: Payer: Self-pay | Admitting: *Deleted

## 2024-01-19 NOTE — Progress Notes (Signed)
 Complex Care Management Care Guide Note  01/19/2024 Name: Jacqueline [name] MRN: [id number] DOB: 1930-11-10  Jacqueline [name] is a 89 y.o. year old female who is a primary care patient of Rumball, Alison M, DO and is actively engaged with the care management team. I reached out to [employer] by phone today to assist with scheduling  with the BSW.  Follow up plan: Telephone appointment with complex care management team member scheduled for:  01/27/24  Harlene Satterfield  [facility]  [facility], [facility] Guide  Direct Dial: [phone]  Fax [phone]

## 2024-01-24 ENCOUNTER — Telehealth: Payer: Self-pay

## 2024-01-24 NOTE — Telephone Encounter (Signed)
 Patient Jacqueline [name] on nurse line regarding scheduling appointment.   When I called patient back, she had already been scheduled for tomorrow morning in ATC.   She also states that she received her official DMV placard application in the mail and that she will bring this to the office tomorrow.   I have removed our copy from PCP box.   Chiquita JAYSON English, RN

## 2024-01-24 NOTE — Telephone Encounter (Signed)
 Patient calls nurse line requesting handicap placard.   Patient is requesting that this be completed and mailed to her home address.   Form printed off DMV website, demographics completed and placed in PCP box for completion.   Chiquita JAYSON English, RN

## 2024-01-25 ENCOUNTER — Ambulatory Visit
Admission: RE | Admit: 2024-01-25 | Discharge: 2024-01-25 | Disposition: A | Discharge status: Home / Self Care | Source: Ambulatory Visit | Attending: Family Medicine | Admitting: Family Medicine

## 2024-01-25 ENCOUNTER — Ambulatory Visit (INDEPENDENT_AMBULATORY_CARE_PROVIDER_SITE_OTHER): Admitting: Student

## 2024-01-25 VITALS — BP 117/74 | HR 96 | Ht 61.0 in | Wt 114.0 lb

## 2024-01-25 DIAGNOSIS — M5441 Lumbago with sciatica, right side: Secondary | ICD-10-CM | POA: Diagnosis not present

## 2024-01-25 DIAGNOSIS — M25551 Pain in right hip: Secondary | ICD-10-CM

## 2024-01-25 DIAGNOSIS — M79601 Pain in right arm: Secondary | ICD-10-CM | POA: Diagnosis not present

## 2024-01-25 DIAGNOSIS — M1611 Unilateral primary osteoarthritis, right hip: Secondary | ICD-10-CM | POA: Diagnosis not present

## 2024-01-25 DIAGNOSIS — M545 Low back pain, unspecified: Secondary | ICD-10-CM | POA: Diagnosis not present

## 2024-01-25 MED ORDER — DICLOFENAC SODIUM 1 % EX GEL
4.0000 g | Freq: Four times a day (QID) | CUTANEOUS | 1 refills | Status: AC
Start: 2024-01-25 — End: ?

## 2024-01-25 NOTE — Assessment & Plan Note (Signed)
 Acute on chronic with sciatic symptoms.  Differential: Rule out vertebral fracture, herniated disc, osteoarthritis, muscle strain. - Voltaren  gel - Lidocaine  patches - Lumbar x-ray - Follow-up in 2 weeks - Consider PT for sciatic symptoms if no fractures are present, if fractures are present consider vitamin D /calcium  levels plus discussion of osteoporosis treatment

## 2024-01-25 NOTE — Progress Notes (Signed)
    SUBJECTIVE:   CHIEF COMPLAINT / HPI:   Right hip/back pain Patient has chronic low back pain.  History of hip fracture in 2023.  Did not require surgical fixation.  Carries diagnosis of osteoporosis.  She is currently taking vitamin D , but is unsure if she is taking her calcium .  Denies any new traumatic injuries, falls.  Pain started worsening approximately 1 week ago.  Having sciatica symptoms.  Symptoms occur throughout the day, she does not know any alleviating/aggravating factors.  She has been trying to take Tylenol  without much success.  No fevers, weight loss, incontinence.  PERTINENT  PMH / PSH: Osteoporosis, CKD, chronic back pain  OBJECTIVE:   BP 117/74   Pulse 96   Ht 5' 1 (1.549 m)   Wt 114 lb (51.7 kg)   SpO2 96%   BMI 21.54 kg/m    General: NAD, pleasant Cardio: RRR, no MRG. Cap Refill <2s. Respiratory: CTAB, normal wob on RA Back: No gross deformity, no ecchymosis, no swelling.  TTP over spinous processes of lumbar spine.  No pelvic ring instability, however pain with manipulation.  Straight leg positive on right.  5/5 strength and normal sensation of lower extremities.  ASSESSMENT/PLAN:   Assessment & Plan Acute right-sided low back pain with right-sided sciatica Acute on chronic with sciatic symptoms.  Differential: Rule out vertebral fracture, herniated disc, osteoarthritis, muscle strain. - Voltaren  gel - Lidocaine  patches - Lumbar x-ray - Follow-up in 2 weeks - Consider PT for sciatic symptoms if no fractures are present, if fractures are present consider vitamin D /calcium  levels plus discussion of osteoporosis treatment Right hip pain Differential as above and including: Rule out hip fracture, osteoarthritis -Right hip x-ray  Gladis Church, DO [facility]

## 2024-01-25 NOTE — Patient Instructions (Signed)
 It was great to see you! Thank you for allowing me to participate in your care!   I recommend that you always bring your medications to each appointment as this makes it easy to ensure we are on the correct medications and helps us  not miss when refills are needed.  Our plans for today:  - Please use Voltaren  gel up to 4 times daily on low back and hip - Additionally you an use lidocaine  patches from the pharmacy - Follow-up in 2-4 weeks with PCP  An x-ray was ordered for you---you do not need an appointment to have this completed.  I recommend going to [facility] [address] [place]  If the results are normal,I will send you a letter  I will call you with results if anything is abnormal     Take care and seek immediate care sooner if you develop any concerns. Please remember to show up 15 minutes before your scheduled appointment time!  Gladis Church, DO [facility]

## 2024-01-26 ENCOUNTER — Telehealth: Payer: Self-pay | Admitting: Cardiovascular Disease

## 2024-01-26 ENCOUNTER — Other Ambulatory Visit: Payer: Self-pay | Admitting: Family Medicine

## 2024-01-26 DIAGNOSIS — I480 Paroxysmal atrial fibrillation: Secondary | ICD-10-CM

## 2024-01-26 NOTE — Telephone Encounter (Signed)
 Pt c/o medication issue:  1. Name of Medication:   diclofenac  Sodium (VOLTAREN ) 1 % GEL    2. How are you currently taking this medication (dosage and times per day)? As written  3. Are you having a reaction (difficulty breathing--STAT)? No   4. What is your medication issue? PCP put her on this and she read warning label and it said not to use this if you have heart condition. She wants to check with us  and make sure it is ok to use.

## 2024-01-26 NOTE — Telephone Encounter (Signed)
Pt contacted and advised.

## 2024-01-26 NOTE — Telephone Encounter (Signed)
 Please review and advise.

## 2024-01-27 ENCOUNTER — Other Ambulatory Visit: Payer: Self-pay

## 2024-01-27 NOTE — Patient Instructions (Signed)
 Visit Information  Thank you for taking time to visit with me today. Please don't hesitate to contact me if I can be of assistance to you before our next scheduled appointment.  Our next appointment is by telephone on 02/10/2024 at 3pm Please call the care guide team at [phone] if you need to cancel or reschedule your appointment.   Following is a copy of your care plan:   Goals Addressed   None     Please call the Suicide and Crisis Lifeline: 988 go to [facility] [address], [place] [id number]) call 911 if you are experiencing a Mental Health or Behavioral Health Crisis or need someone to talk to.  Patient verbalizes understanding of instructions and care plan provided today and agrees to view in MyChart. Active MyChart status and patient understanding of how to access instructions and care plan via MyChart confirmed with patient.     Jacqueline [name], BSW [place]/VBCI - [employer] Social Worker [phone]

## 2024-01-27 NOTE — Patient Outreach (Signed)
 Complex Care Management   Visit Note  01/27/2024  Name:  Jacqueline [name] MRN: [id number] DOB: 11/15/1930  Situation: Referral received for Complex Care Management related to referral for meals on wheels (MOW) I obtained verbal consent from Patient.  Visit completed with patient  on the phone  Background:   Past Medical History:  Diagnosis Date   Abnormal ECG 10/22/2021   Arthritis    back   Atrial fibrillation with rapid ventricular response (HCC) 02/24/2014   a. CHA2DS2VASc = 6 -> on eliquis ;  b. 02/2014 s/p DCCV;  c. 08/2014 Echo: EF 55-60%, Gr 2 DD, mild MR, triv AI.   Bronchitis 10/22/2021   CAD (coronary artery disease)    a. s/p CABG x 2 (LIMA->LAD, VG->Diag);  b. 08/2014 Cath: LM nl, LAD 90p, LCX nl, RCA nl/dominant, LIMA->LAD atretic, VG->D2 patent w/ retrograde filling of LAD, EF 55-60%-->Med Rx.   Diverticulitis 02/21/2012   Elevated troponin 10/22/2021   GERD (gastroesophageal reflux disease)    Hyperlipemia    Hypertension    Insomnia    Vertigo 11/24/2018    Assessment: SW met with patient over the phone for initial call. Patient was alert and cognitive. SDOH needs were addressed and no SDOH needs/barriers were identified. Patient states she wants to be referred to Meals [employer] Wheels (MOW). Patient was education on this resource and how it works. Patient was also advised that their may be a waiting list for this resource. Patient understood and agreed to wait to her back from BSW to determine if she wants to be put on the waitlist. BSW will be calling resource to confirm details/referral process and circle back to patient was information. Patient agreed and understood. Patient was offered the community support and nutrition program through [employer], but she declined since her daughter-in-law already has patient's groceries delivered to her home. Patient states she has a good support system in place with her sons and daughter-in-law. Patient was asked if she needed additional  resources and patient declined. Direct phone # for BSW was provided to patient.   SDOH Interventions    Flowsheet Row Patient Outreach Telephone from 01/27/2024 in [place] POPULATION HEALTH DEPARTMENT Patient Outreach Telephone from 12/08/2023 in [place] POPULATION HEALTH DEPARTMENT Clinical Support from 10/28/2023 in [facility] - A Dept Of [place]. [facility] Patient Outreach from 07/16/2023 in [place] POPULATION HEALTH DEPARTMENT Patient Outreach from 06/11/2023 in [place] POPULATION HEALTH DEPARTMENT Clinical Support from 11/12/2022 in [facility] - A Dept Of [place]. [facility]  SDOH Interventions        Food Insecurity Interventions Intervention Not Indicated Intervention Not Indicated Intervention Not Indicated -- -- Intervention Not Indicated  Housing Interventions Intervention Not Indicated Intervention Not Indicated Intervention Not Indicated -- -- Intervention Not Indicated  Transportation Interventions Intervention Not Indicated Intervention Not Indicated Intervention Not Indicated -- -- Intervention Not Indicated  Utilities Interventions Intervention Not Indicated Intervention Not Indicated Intervention Not Indicated -- -- Intervention Not Indicated  Alcohol  Usage Interventions -- -- Intervention Not Indicated (Score <7) -- -- Intervention Not Indicated (Score <7)  Financial Strain Interventions Intervention Not Indicated -- Intervention Not Indicated -- -- Intervention Not Indicated  Physical Activity Interventions -- -- Patient Declined, Intervention Not Indicated Patient Declined Patient Declined Patient Refused, Other (Comments)  Stress Interventions -- -- Intervention Not Indicated -- -- Intervention Not Indicated  Social Connections Interventions -- -- Intervention Not Indicated -- -- Intervention Not Indicated  Health Literacy Interventions -- -- Intervention Not Indicated -- Intervention Not Indicated --       Recommendation:   None  Follow Up Plan:   Telephone follow up appointment date/time:  02/10/2024 at 3pm  Laymon Doll, BSW [place]/VBCI - [facility] Social Worker [phone]

## 2024-02-01 ENCOUNTER — Ambulatory Visit: Payer: Self-pay | Admitting: Family Medicine

## 2024-02-06 ENCOUNTER — Encounter (HOSPITAL_COMMUNITY): Payer: Self-pay

## 2024-02-06 ENCOUNTER — Emergency Department (HOSPITAL_COMMUNITY)

## 2024-02-06 ENCOUNTER — Inpatient Hospital Stay (HOSPITAL_COMMUNITY)

## 2024-02-06 ENCOUNTER — Inpatient Hospital Stay (HOSPITAL_COMMUNITY)
Admission: EM | Admit: 2024-02-06 | Discharge: 2024-02-08 | DRG: 291 | Disposition: A | Discharge status: Home Health | Attending: Family Medicine | Admitting: Family Medicine

## 2024-02-06 ENCOUNTER — Other Ambulatory Visit: Payer: Self-pay

## 2024-02-06 DIAGNOSIS — I2510 Atherosclerotic heart disease of native coronary artery without angina pectoris: Secondary | ICD-10-CM | POA: Diagnosis not present

## 2024-02-06 DIAGNOSIS — Z90710 Acquired absence of both cervix and uterus: Secondary | ICD-10-CM

## 2024-02-06 DIAGNOSIS — Z87440 Personal history of urinary (tract) infections: Secondary | ICD-10-CM | POA: Diagnosis not present

## 2024-02-06 DIAGNOSIS — I2721 Secondary pulmonary arterial hypertension: Secondary | ICD-10-CM | POA: Diagnosis present

## 2024-02-06 DIAGNOSIS — R0609 Other forms of dyspnea: Secondary | ICD-10-CM | POA: Diagnosis present

## 2024-02-06 DIAGNOSIS — E785 Hyperlipidemia, unspecified: Secondary | ICD-10-CM | POA: Diagnosis not present

## 2024-02-06 DIAGNOSIS — E039 Hypothyroidism, unspecified: Secondary | ICD-10-CM | POA: Diagnosis not present

## 2024-02-06 DIAGNOSIS — I480 Paroxysmal atrial fibrillation: Secondary | ICD-10-CM | POA: Diagnosis not present

## 2024-02-06 DIAGNOSIS — R2681 Unsteadiness on feet: Secondary | ICD-10-CM

## 2024-02-06 DIAGNOSIS — I130 Hypertensive heart and chronic kidney disease with heart failure and stage 1 through stage 4 chronic kidney disease, or unspecified chronic kidney disease: Principal | ICD-10-CM | POA: Diagnosis present

## 2024-02-06 DIAGNOSIS — R0789 Other chest pain: Secondary | ICD-10-CM | POA: Insufficient documentation

## 2024-02-06 DIAGNOSIS — I951 Orthostatic hypotension: Secondary | ICD-10-CM | POA: Diagnosis present

## 2024-02-06 DIAGNOSIS — R5381 Other malaise: Secondary | ICD-10-CM | POA: Diagnosis present

## 2024-02-06 DIAGNOSIS — Z820 Family history of epilepsy and other diseases of the nervous system: Secondary | ICD-10-CM

## 2024-02-06 DIAGNOSIS — Z885 Allergy status to narcotic agent status: Secondary | ICD-10-CM

## 2024-02-06 DIAGNOSIS — Z789 Other specified health status: Secondary | ICD-10-CM

## 2024-02-06 DIAGNOSIS — R079 Chest pain, unspecified: Secondary | ICD-10-CM | POA: Diagnosis not present

## 2024-02-06 DIAGNOSIS — Z79899 Other long term (current) drug therapy: Secondary | ICD-10-CM | POA: Diagnosis not present

## 2024-02-06 DIAGNOSIS — Z7901 Long term (current) use of anticoagulants: Secondary | ICD-10-CM | POA: Diagnosis not present

## 2024-02-06 DIAGNOSIS — Z8249 Family history of ischemic heart disease and other diseases of the circulatory system: Secondary | ICD-10-CM | POA: Diagnosis not present

## 2024-02-06 DIAGNOSIS — Z96651 Presence of right artificial knee joint: Secondary | ICD-10-CM | POA: Diagnosis not present

## 2024-02-06 DIAGNOSIS — R7401 Elevation of levels of liver transaminase levels: Secondary | ICD-10-CM | POA: Diagnosis present

## 2024-02-06 DIAGNOSIS — F419 Anxiety disorder, unspecified: Secondary | ICD-10-CM | POA: Diagnosis present

## 2024-02-06 DIAGNOSIS — I4891 Unspecified atrial fibrillation: Secondary | ICD-10-CM | POA: Diagnosis not present

## 2024-02-06 DIAGNOSIS — Z951 Presence of aortocoronary bypass graft: Secondary | ICD-10-CM | POA: Diagnosis not present

## 2024-02-06 DIAGNOSIS — Z8261 Family history of arthritis: Secondary | ICD-10-CM | POA: Diagnosis not present

## 2024-02-06 DIAGNOSIS — M810 Age-related osteoporosis without current pathological fracture: Secondary | ICD-10-CM | POA: Diagnosis present

## 2024-02-06 DIAGNOSIS — Z888 Allergy status to other drugs, medicaments and biological substances status: Secondary | ICD-10-CM

## 2024-02-06 DIAGNOSIS — I4819 Other persistent atrial fibrillation: Secondary | ICD-10-CM | POA: Diagnosis present

## 2024-02-06 DIAGNOSIS — K219 Gastro-esophageal reflux disease without esophagitis: Secondary | ICD-10-CM | POA: Diagnosis present

## 2024-02-06 DIAGNOSIS — Z9842 Cataract extraction status, left eye: Secondary | ICD-10-CM

## 2024-02-06 DIAGNOSIS — Z79890 Hormone replacement therapy (postmenopausal): Secondary | ICD-10-CM

## 2024-02-06 DIAGNOSIS — I509 Heart failure, unspecified: Principal | ICD-10-CM

## 2024-02-06 DIAGNOSIS — G629 Polyneuropathy, unspecified: Secondary | ICD-10-CM | POA: Diagnosis not present

## 2024-02-06 DIAGNOSIS — Z809 Family history of malignant neoplasm, unspecified: Secondary | ICD-10-CM

## 2024-02-06 DIAGNOSIS — R300 Dysuria: Secondary | ICD-10-CM | POA: Diagnosis not present

## 2024-02-06 DIAGNOSIS — Z555 Less than a high school diploma: Secondary | ICD-10-CM | POA: Diagnosis not present

## 2024-02-06 DIAGNOSIS — R531 Weakness: Secondary | ICD-10-CM

## 2024-02-06 DIAGNOSIS — M479 Spondylosis, unspecified: Secondary | ICD-10-CM | POA: Diagnosis not present

## 2024-02-06 DIAGNOSIS — N1832 Chronic kidney disease, stage 3b: Secondary | ICD-10-CM | POA: Diagnosis not present

## 2024-02-06 DIAGNOSIS — I5033 Acute on chronic diastolic (congestive) heart failure: Secondary | ICD-10-CM | POA: Diagnosis present

## 2024-02-06 DIAGNOSIS — Z9841 Cataract extraction status, right eye: Secondary | ICD-10-CM

## 2024-02-06 DIAGNOSIS — R002 Palpitations: Secondary | ICD-10-CM | POA: Diagnosis not present

## 2024-02-06 DIAGNOSIS — I110 Hypertensive heart disease with heart failure: Secondary | ICD-10-CM | POA: Diagnosis not present

## 2024-02-06 DIAGNOSIS — N1831 Chronic kidney disease, stage 3a: Secondary | ICD-10-CM | POA: Diagnosis not present

## 2024-02-06 DIAGNOSIS — I517 Cardiomegaly: Secondary | ICD-10-CM | POA: Diagnosis not present

## 2024-02-06 DIAGNOSIS — Z22358 Carrier of other enterobacterales: Secondary | ICD-10-CM

## 2024-02-06 DIAGNOSIS — N3001 Acute cystitis with hematuria: Secondary | ICD-10-CM | POA: Diagnosis not present

## 2024-02-06 LAB — COMPREHENSIVE METABOLIC PANEL WITH GFR
ALT: 145 U/L — ABNORMAL HIGH (ref 0–44)
AST: 132 U/L — ABNORMAL HIGH (ref 15–41)
Albumin: 3.4 g/dL — ABNORMAL LOW (ref 3.5–5.0)
Alkaline Phosphatase: 164 U/L — ABNORMAL HIGH (ref 38–126)
Anion gap: 10 (ref 5–15)
BUN: 23 mg/dL (ref 8–23)
CO2: 22 mmol/L (ref 22–32)
Calcium: 9.1 mg/dL (ref 8.9–10.3)
Chloride: 105 mmol/L (ref 98–111)
Creatinine, Ser: 1.71 mg/dL — ABNORMAL HIGH (ref 0.44–1.00)
GFR, Estimated: 28 mL/min — ABNORMAL LOW (ref >60)
Glucose, Bld: 98 mg/dL (ref 70–99)
Potassium: 3.8 mmol/L (ref 3.5–5.1)
Sodium: 137 mmol/L (ref 135–145)
Total Bilirubin: 0.8 mg/dL (ref 0.0–1.2)
Total Protein: 6.8 g/dL (ref 6.5–8.1)

## 2024-02-06 LAB — TROPONIN I (HIGH SENSITIVITY)
Troponin I (High Sensitivity): 16 ng/L (ref <18)
Troponin I (High Sensitivity): 14 ng/L (ref <18)

## 2024-02-06 LAB — URINALYSIS, W/ REFLEX TO CULTURE (INFECTION SUSPECTED)
Bilirubin Urine: NEGATIVE
Glucose, UA: NEGATIVE mg/dL
Ketones, ur: NEGATIVE mg/dL
Nitrite: NEGATIVE
Protein, ur: NEGATIVE mg/dL
Specific Gravity, Urine: 1.006 (ref 1.005–1.030)
pH: 7 (ref 5.0–8.0)

## 2024-02-06 LAB — CBC
HCT: 37.5 % (ref 36.0–46.0)
Hemoglobin: 12.4 g/dL (ref 12.0–15.0)
MCH: 32.1 pg (ref 26.0–34.0)
MCHC: 33.1 g/dL (ref 30.0–36.0)
MCV: 97.2 fL (ref 80.0–100.0)
Platelets: 142 K/uL — ABNORMAL LOW (ref 150–400)
RBC: 3.86 MIL/uL — ABNORMAL LOW (ref 3.87–5.11)
RDW: 15.2 % (ref 11.5–15.5)
WBC: 8.2 K/uL (ref 4.0–10.5)
nRBC: 0 % (ref 0.0–0.2)

## 2024-02-06 LAB — BASIC METABOLIC PANEL WITH GFR
Anion gap: 12 (ref 5–15)
BUN: 23 mg/dL (ref 8–23)
CO2: 23 mmol/L (ref 22–32)
Calcium: 9.2 mg/dL (ref 8.9–10.3)
Chloride: 103 mmol/L (ref 98–111)
Creatinine, Ser: 1.77 mg/dL — ABNORMAL HIGH (ref 0.44–1.00)
GFR, Estimated: 27 mL/min — ABNORMAL LOW (ref >60)
Glucose, Bld: 99 mg/dL (ref 70–99)
Potassium: 4 mmol/L (ref 3.5–5.1)
Sodium: 138 mmol/L (ref 135–145)

## 2024-02-06 LAB — HEPATITIS PANEL, ACUTE
HCV Ab: NONREACTIVE
Hep A IgM: NONREACTIVE
Hep B C IgM: NONREACTIVE
Hepatitis B Surface Ag: NONREACTIVE

## 2024-02-06 LAB — MAGNESIUM: Magnesium: 2 mg/dL (ref 1.7–2.4)

## 2024-02-06 LAB — BRAIN NATRIURETIC PEPTIDE: B Natriuretic Peptide: 366.4 pg/mL — ABNORMAL HIGH (ref 0.0–100.0)

## 2024-02-06 MED ORDER — APIXABAN 2.5 MG PO TABS
2.5000 mg | ORAL_TABLET | Freq: Two times a day (BID) | ORAL | Status: DC
  Administered 2024-02-06 – 2024-02-08 (×4): 2.5 mg via ORAL
  Filled 2024-02-06 (×4): qty 1

## 2024-02-06 MED ORDER — AMLODIPINE BESYLATE 5 MG PO TABS
5.0000 mg | ORAL_TABLET | Freq: Every day | ORAL | Status: DC
  Administered 2024-02-06 – 2024-02-08 (×3): 5 mg via ORAL
  Filled 2024-02-06 (×3): qty 1

## 2024-02-06 MED ORDER — CYCLOSPORINE 0.05 % OP EMUL
1.0000 [drp] | Freq: Two times a day (BID) | OPHTHALMIC | Status: DC
  Administered 2024-02-06 – 2024-02-08 (×3): 1 [drp] via OPHTHALMIC
  Filled 2024-02-06 (×6): qty 30

## 2024-02-06 MED ORDER — POLYVINYL ALCOHOL 1.4 % OP SOLN: 1.0000 [drp] | Freq: Every day | OPHTHALMIC | Status: DC | PRN

## 2024-02-06 MED ORDER — AMIODARONE HCL 200 MG PO TABS
200.0000 mg | ORAL_TABLET | Freq: Every day | ORAL | Status: DC
  Administered 2024-02-07 – 2024-02-08 (×2): 200 mg via ORAL
  Filled 2024-02-06 (×2): qty 1

## 2024-02-06 MED ORDER — FUROSEMIDE 10 MG/ML IJ SOLN
40.0000 mg | Freq: Once | INTRAMUSCULAR | Status: AC
  Administered 2024-02-06: 40 mg via INTRAVENOUS
  Filled 2024-02-06: qty 4

## 2024-02-06 MED ORDER — PRAVASTATIN SODIUM 40 MG PO TABS
40.0000 mg | ORAL_TABLET | Freq: Every evening | ORAL | Status: DC
  Administered 2024-02-06 – 2024-02-07 (×2): 40 mg via ORAL
  Filled 2024-02-06 (×2): qty 1

## 2024-02-06 MED ORDER — ROPINIROLE HCL 1 MG PO TABS
1.0000 mg | ORAL_TABLET | Freq: Every day | ORAL | Status: DC
  Administered 2024-02-06 – 2024-02-07 (×2): 1 mg via ORAL
  Filled 2024-02-06 (×3): qty 1

## 2024-02-06 MED ORDER — POLYETHYLENE GLYCOL 3350 17 G PO PACK
17.0000 g | PACK | Freq: Every day | ORAL | Status: DC | PRN
  Administered 2024-02-08: 17 g via ORAL
  Filled 2024-02-06: qty 1

## 2024-02-06 MED ORDER — ALPRAZOLAM 0.5 MG PO TABS
0.5000 mg | ORAL_TABLET | Freq: Every evening | ORAL | Status: DC | PRN
  Administered 2024-02-06 – 2024-02-07 (×2): 0.5 mg via ORAL
  Filled 2024-02-06: qty 2
  Filled 2024-02-06: qty 1

## 2024-02-06 MED ORDER — GABAPENTIN 100 MG PO CAPS
100.0000 mg | ORAL_CAPSULE | Freq: Three times a day (TID) | ORAL | Status: DC
  Administered 2024-02-06 – 2024-02-08 (×7): 100 mg via ORAL
  Filled 2024-02-06 (×7): qty 1

## 2024-02-06 MED ORDER — LEVOTHYROXINE SODIUM 75 MCG PO TABS
75.0000 ug | ORAL_TABLET | Freq: Every day | ORAL | Status: DC
  Administered 2024-02-06 – 2024-02-08 (×3): 75 ug via ORAL
  Filled 2024-02-06 (×3): qty 1

## 2024-02-06 NOTE — ED Triage Notes (Signed)
 Pt c.o palpitations for about a week and generalized weakness for a few weeks. Pt sent by UC this morning.

## 2024-02-06 NOTE — ED Provider Notes (Signed)
 [place] EMERGENCY DEPARTMENT AT [facility] Provider Note   CSN: [id number] Arrival date & time: 02/06/24  1057     Patient presents with: Palpitations and Weakness   Jacqueline [name] is a 89 y.o. female.   Pt is a 89 y.o. female with coronary artery disease and previous bypass surgery (2006), paroxysmal atrial fibrillation requiring previous cardioversion, diastolic heart failure, HTN who is presenting today with a 1 month history of gradual worsening generalized weakness.  She also has reported dyspnea on exertion and orthopnea which has gradually worsened as well.  She states she has gained about 2 pounds and she has had a poor appetite but she denies any lower extremity swelling or abdominal distention.  She does occasionally get chest pressure but denies any currently.  Shortness of breath is now starting to become at rest as well.  She denies any recent medication changes.  She does take Eliquis  but last week was off of it for a week because she was having some bleeding of her gums.  She has not had cough or fever.  She did not has any abdominal pain vomiting or diarrhea.  The history is provided by the patient and a relative.  Palpitations Associated symptoms: weakness   Weakness      Prior to Admission medications   Medication Sig Start Date End Date Taking? Authorizing Provider  acetaminophen  (TYLENOL ) 325 MG tablet Take 2 tablets (650 mg total) by mouth every 6 (six) hours. 07/22/22   Orlando Pond, DO  ALPRAZolam  (XANAX ) 0.5 MG tablet TAKE 1 TABLET BY MOUTH AT BEDTIME AS NEEDED FOR ANXIETY 11/08/23   Rumball, Alison M, DO  amiodarone  (PACERONE ) 200 MG tablet TAKE 1 TABLET BY MOUTH EVERY DAY 01/26/24   Rumball, Alison M, DO  amLODipine  (NORVASC ) 5 MG tablet TAKE 1 TABLET (5 MG TOTAL) BY MOUTH DAILY. 03/01/23   Croitoru, Mihai, MD  apixaban  (ELIQUIS ) 2.5 MG TABS tablet Take 1 tablet (2.5 mg total) by mouth 2 (two) times daily. 01/11/23   Croitoru, Mihai, MD  apixaban   (ELIQUIS ) 2.5 MG TABS tablet Take 1 tablet (2.5 mg total) by mouth 2 (two) times daily. 07/13/23   Croitoru, Mihai, MD  apixaban  (ELIQUIS ) 2.5 MG TABS tablet Take 1 tablet (2.5 mg total) by mouth 2 (two) times daily. 08/30/23   Daneen Damien BROCKS, NP  cholecalciferol  (VITAMIN D3) 25 MCG (1000 UNIT) tablet Take 1,000 Units by mouth daily.    [provider]  cycloSPORINE  (RESTASIS ) 0.05 % ophthalmic emulsion Place 1 drop into both eyes 2 (two) times daily.    [provider]  diclofenac  Sodium (VOLTAREN ) 1 % GEL Apply 4 g topically 4 (four) times daily. 01/25/24   Howell Lunger, DO  ELIQUIS  2.5 MG TABS tablet TAKE 1 TABLET BY MOUTH TWICE A DAY 11/26/23   Croitoru, Mihai, MD  furosemide  (LASIX ) 20 MG tablet Take 1 tablet (20 mg total) by mouth daily. 09/01/23   Croitoru, Mihai, MD  gabapentin  (NEURONTIN ) 100 MG capsule TAKE 1 CAPSULE (100 MG TOTAL) BY MOUTH 3 TIMES A DAY 06/28/23   Rumball, Alison M, DO  hydrALAZINE  (APRESOLINE ) 25 MG tablet Take 1 tablet (25 mg total) by mouth 3 (three) times daily as needed (may take up to three times daily for BP >160). Patient not taking: Reported on 01/11/2024 06/01/23 08/30/23  Croitoru, Jerel, MD  levothyroxine  (SYNTHROID ) 75 MCG tablet Take 1 tablet (75 mcg total) by mouth daily. 06/21/23   Rumball, Alison M, DO  Multiple  Vitamin (MULTIVITAMIN WITH MINERALS) TABS tablet Take 1 tablet by mouth daily.    [provider]  nitroGLYCERIN  (NITROSTAT ) 0.4 MG SL tablet Place 1 tablet (0.4 mg total) under the tongue every 5 (five) minutes as needed for chest pain. 12/03/21   Croitoru, Mihai, MD  Omega-3 Fatty Acids (FISH OIL) 1000 MG CAPS Take 1,000 mg by mouth every evening.    [provider]  omeprazole  (PRILOSEC) 20 MG capsule TAKE 1 CAPSULE BY MOUTH EVERY DAY 08/31/23   Rumball, Donald HERO, DO  Polyethyl Glycol-Propyl Glycol (LUBRICANT EYE DROPS) 0.4-0.3 % SOLN Place 1 drop into both eyes 4 (four) times daily.    [provider]   polyethylene glycol (MIRALAX  / GLYCOLAX ) 17 g packet Take 17 g by mouth 2 (two) times daily. Patient taking differently: Take 17 g by mouth 2 (two) times daily. Patient takes it as needed 02/13/22   Krishnan, Gokul, MD  potassium chloride  SA (KLOR-CON  M) 20 MEQ tablet Take 1 tablet (20 mEq total) by mouth daily. 08/26/23   Haze Lonni PARAS, MD  pravastatin  (PRAVACHOL ) 40 MG tablet Take 1 tablet (40 mg total) by mouth every evening. 10/11/23   Croitoru, Mihai, MD  rOPINIRole  (REQUIP ) 0.25 MG tablet Take 1 tablet by mouth in the morning and afternoon. 12/02/23   Madelon Donald HERO, DO  rOPINIRole  (REQUIP ) 1 MG tablet Take 1 tablet (1 mg total) by mouth at bedtime. 12/02/23   Rumball, Alison M, DO    Allergies: Atorvastatin, Beta adrenergic blockers, Crestor [rosuvastatin], Morphine and codeine, Morphine sulfate, and Prednisone     Review of Systems  Cardiovascular:  Positive for palpitations.  Neurological:  Positive for weakness.    Updated Vital Signs BP 131/74   Pulse 76   Temp 98.1 F (36.7 C)   Resp 16   SpO2 95%   Physical Exam Vitals and nursing note reviewed.  Constitutional:      General: She is not in acute distress.    Appearance: She is well-developed.  HENT:     Head: Normocephalic and atraumatic.  Eyes:     Pupils: Pupils are equal, round, and reactive to light.  Cardiovascular:     Rate and Rhythm: Normal rate. Rhythm irregularly irregular.     Heart sounds: Normal heart sounds. No murmur heard.    No friction rub.  Pulmonary:     Effort: Pulmonary effort is normal.     Breath sounds: Normal breath sounds. No wheezing or rales.  Abdominal:     General: Bowel sounds are normal. There is no distension.     Palpations: Abdomen is soft.     Tenderness: There is no abdominal tenderness. There is no guarding or rebound.  Musculoskeletal:        General: No tenderness. Normal range of motion.     Right lower leg: No edema.     Left lower leg: No edema.      Comments: No edema  Skin:    General: Skin is warm and dry.     Findings: No rash.  Neurological:     Mental Status: She is alert and oriented to person, place, and time.     Cranial Nerves: No cranial nerve deficit.  Psychiatric:        Behavior: Behavior normal.     (all labs ordered are listed, but only abnormal results are displayed) Labs Reviewed  BASIC METABOLIC PANEL WITH GFR - Abnormal; Notable for the following components:      Result  Value   Creatinine, Ser 1.77 (*)    GFR, Estimated 27 (*)    All other components within normal limits  CBC - Abnormal; Notable for the following components:   RBC 3.86 (*)    Platelets 142 (*)    All other components within normal limits  COMPREHENSIVE METABOLIC PANEL WITH GFR - Abnormal; Notable for the following components:   Creatinine, Ser 1.71 (*)    Albumin  3.4 (*)    AST 132 (*)    ALT 145 (*)    Alkaline Phosphatase 164 (*)    GFR, Estimated 28 (*)    All other components within normal limits  BRAIN NATRIURETIC PEPTIDE - Abnormal; Notable for the following components:   B Natriuretic Peptide 366.4 (*)    All other components within normal limits  URINALYSIS, W/ REFLEX TO CULTURE (INFECTION SUSPECTED)  TROPONIN I (HIGH SENSITIVITY)  TROPONIN I (HIGH SENSITIVITY)    EKG: EKG Interpretation Date/Time:  Sunday February 06 2024 11:06:37 EDT Ventricular Rate:  103 PR Interval:    QRS Duration:  80 QT Interval:  360 QTC Calculation: 471 R Axis:   54  Text Interpretation: new Atrial fibrillation with rapid ventricular response Cannot rule out Anterior infarct , age undetermined When compared with ECG of 30-Aug-2023 14:51, PREVIOUS ECG IS PRESENT Confirmed by Doretha Folks ([id number]) on 02/06/2024 11:20:00 AM  Radiology: [facility] Chest 2 View Result Date: 02/06/2024 CLINICAL DATA:  Palpitations EXAM: CHEST - 2 VIEW COMPARISON:  08/25/2023 FINDINGS: Cardiomegaly status post median sternotomy. No acute airspace opacity. Unchanged thin  walled cyst in the superior segment right lower lobe. The visualized skeletal structures are unremarkable. IMPRESSION: Cardiomegaly without acute abnormality of the lungs. Electronically Signed   By: Marolyn JONETTA Jaksch M.D.   On: 02/06/2024 12:03     Procedures   Medications Ordered in the ED  furosemide  (LASIX ) injection 40 mg (has no administration in time range)                                    Medical Decision Making Amount and/or Complexity of Data Reviewed External Data Reviewed: notes. Labs: ordered. Decision-making details documented in ED Course. Radiology: ordered and independent interpretation performed. Decision-making details documented in ED Course. ECG/medicine tests: ordered and independent interpretation performed. Decision-making details documented in ED Course.  Risk Decision regarding hospitalization.   Pt with multiple medical problems and comorbidities and presenting today with a complaint that caries a high risk for morbidity and mortality.  Here today with the above complaints.  Concern for CHF given patient's weight gain, dyspnea on exertion, orthopnea as well as she may be in more persistent A-fib at this time.  Lower suspicion for ACS at this time.  Low suspicion for infectious etiology.  Patient did go to urgent care and they reported she might have a urinary tract infection however urine results were not definitive and patient is not having any dysuria frequency, urgency, abdominal symptoms.  Just poor appetite.  I independently interpreted patient's labs and EKG.  EKG does show recurrent atrial fibrillation.  CMP today with mildly worsening creatinine at 1.71 with elevated LFTs with an AST of 132 and ALT of 145 with normal electrolytes, BNP is mildly elevated above baseline at 366, CBC without acute findings and troponin is negative.  I have independently visualized and interpreted pt's images today.  Chest x-ray with cardiomegaly but no acute findings.  Patient had  last echo in 2023 and at that time EF was 55 to 60%.  Patient does not take a diuretic regularly.  At this time would not be candidate for cardioversion as she was off of her Eliquis  for a week a few weeks ago.  Speaking with patient's family they did state that she went back on Lasix  20 mg daily about a month ago because she did not realize she needed to be on it consistently.  However symptoms have not improved.  After discussing findings with the family given patient's significant worsening symptoms will admit for diuresis and a new echo.  Low suspicion for UTI at this time.  Consulted family medicine for admission.  Patient and her son are comfortable with this plan  CRITICAL CARE Performed by: Keyna Blizard Total critical care time: 30 minutes Critical care time was exclusive of separately billable procedures and treating other patients. Critical care was necessary to treat or prevent imminent or life-threatening deterioration. Critical care was time spent personally by me on the following activities: development of treatment plan with patient and/or surrogate as well as nursing, discussions with consultants, evaluation of patient's response to treatment, examination of patient, obtaining history from patient or surrogate, ordering and performing treatments and interventions, ordering and review of laboratory studies, ordering and review of radiographic studies, pulse oximetry and re-evaluation of patient's condition.        Final diagnoses:  Acute on chronic congestive heart failure, unspecified heart failure type (HCC)  Weakness  Atrial fibrillation, unspecified type [facility])    ED Discharge Orders     None          Doretha Folks, MD 02/06/24 1405

## 2024-02-06 NOTE — Plan of Care (Signed)
 FMTS Interim Progress Note  S: Jacqueline [name] is doing well currently with no complaints. Denies current chest tightness. No dyspnea, palpitations; reports she did not have these prior. Son also in the room, no concerns.  O: BP 124/87   Pulse 94   Temp 97.6 F (36.4 C) (Oral)   Resp (!) 26   SpO2 96%   Physical Exam General: Pt lying back in bed, no acute distress. CV: Irregularly irregular rhythm, intermittently tachycardic. No murmurs, rubs, gallops. 2+ radial pulses Resp: Normal work of breathing on room air. Lungs clear to auscultation bilaterally with no wheezes, crackles. Abd: Bowel sounds present and normoactive bilaterally. Soft, nondistended, nontender. Ext: Skin warm, dry. No bilateral lower extremity edema. Neuro: Alert and appropriately responding to questions.  A/P: Acute on chronic diastolic heart failure S/p IV Lasix  40 mg once. Pt euvolemic, clinically without symptoms, voiding well per nurse. - No indication for further Lasix  at this time - No indication for CT PE; consider if symptoms return, tachycardia for prolonged period - Pt already on prevention/treatment with resuming home Eliquis .  Atrial fibrillation Intermittently elevated HR, in A fib at time of exam, no symptoms. - Home amiodarone  200 mg daily - Home Eliquis  2.5 mg daily - Cardiac monitoring  Transaminitis Hepatitis panel negative, unremarkable RUQ US ; consider amiodarone  side effect vs high home tylenol  dosage vs heart failure contribution, thought pt has been on amiodarone  for months with normal LFTs in 12/2023. - Labs: AM CMP   Jacqueline Palma, MD 02/06/2024, 8:47 PM PGY-1, [facility] Family Medicine Service pager [phone]

## 2024-02-06 NOTE — Assessment & Plan Note (Signed)
 Pt w/ recently progressive dyspnea, weakness, orthopnea and chest pressure. Echo 2023 60-65%. BNP 366.  -Admit to FMTS, attending Mcdiarmid -Continue home amlodipine  given stable Bps -Start IV lasix  40mg  and assess clinical response -Strict I/Os -Daily weights -AM CMP, Mag -TSH -Consider CTAPE rule out PE

## 2024-02-06 NOTE — Assessment & Plan Note (Addendum)
 Pt w/ recently progressive dyspnea, weakness, orthopnea and chest pressure. Echo 2023 60-65%. BNP 366.  -Admit to FMTS, attending Mcdiarmid -Continue home amlodipine  given stable Bps -Start IV lasix  40mg  and assess clinical response -Strict I/Os -Daily weights -AM CMP, Mag -TSH -Consider CTAPE rule out PE

## 2024-02-06 NOTE — Assessment & Plan Note (Signed)
 Hypertension - Amlodipine  5 mg tablet daily - taking Hypothyroidism - Levothyroxine  75 mcg tablet daily GERD - Omeprazole  20 mg capsule daily Chronic diastolic CHF - Lasix  20 mg tablet daily Neuropathy - Gabapentin  100 mg capsule 3 times daily

## 2024-02-06 NOTE — ED Notes (Signed)
Went to Ultrasound.

## 2024-02-06 NOTE — Assessment & Plan Note (Addendum)
 Alk phos 164 AST 132 ALT 145.  Possible explanations include amiodarone  side effect, viral, high home tylenol  dosage, cholestasis or biliary causes. -RUQ US  -Hepatitis panel ordered - AM CMP

## 2024-02-06 NOTE — Assessment & Plan Note (Addendum)
 VSS, not in RVR. Unclear length of anticoagulation as patient has intermittently held at home for bleeding. May require cardioversion versus transition off of amiodarone  due to elevated LFTs. -Continue home amiodarone  for now -cardiology consult for input on potential alternative agent in AM -Cardiac monitoring  -Continue Eliquis  2.5mg  daily

## 2024-02-06 NOTE — H&P (Cosign Needed Addendum)
 Hospital Admission History and Physical Service Pager: [phone]  Patient name: Jacqueline [name] Medical record number: [id number] Date of Birth: 15-Jun-1931 Age: 89 y.o. Gender: female  Primary Care Provider: Madelon Donald HERO, DO Consultants: Cardiology Code Status: Full, confirm with patient Preferred Emergency Contact: Jacqueline [name]  Chief Complaint: Weakness, chest pressure  Differential and Medical Decision Making Jacqueline [name] is a 89 y.o. female presenting with a month of worsening weakness and chest pressure. Differential for this patient's presentation of this includes CHF exacerbation (prior diastolic HF, slowly progressive orthopnea, dyspnea, and CP, BNP 366), Afib (chest pain in the setting of established Afib), ACS (chest pain, weakness, risk factors present, unlikely due to trops and EKG reassuring), and anemia (weakness, chest pain, poor po intake, however Hgb WNL 12.4).    Pertinent past medical history includes paroxysmal A-fib pulmonary, arterial hypertension, coronary artery disease, hypertension, hypothyroidism and chronic diastolic congestive heart failure.  Assessment & Plan Acute on chronic diastolic heart failure (HCC) Chest tightness Pt w/ recently progressive dyspnea, weakness, orthopnea and chest pressure. Echo 2023 60-65%. BNP 366.  -Admit to FMTS, attending Mcdiarmid -Continue home amlodipine  given stable Bps -Start IV lasix  40mg  and assess clinical response -Strict I/Os -Daily weights -AM CMP, Mag -TSH -Consider CTAPE rule out PE Atrial fibrillation (HCC) VSS, not in RVR. Unclear length of anticoagulation as patient has intermittently held at home for bleeding. May require cardioversion versus transition off of amiodarone  due to elevated LFTs. -Continue home amiodarone  for now -cardiology consult for input on potential alternative agent in AM -Cardiac monitoring  -Continue Eliquis  2.5mg  daily Transaminitis Alk phos 164 AST 132 ALT 145.   Possible explanations include amiodarone  side effect, viral, high home tylenol  dosage, cholestasis or biliary causes. -RUQ US  -Hepatitis panel ordered - AM CMP Chronic health problem Hypertension - Amlodipine  5 mg tablet daily - taking Hypothyroidism - Levothyroxine  75 mcg tablet daily GERD - Omeprazole  20 mg capsule daily Chronic diastolic CHF - Lasix  20 mg tablet daily Neuropathy - Gabapentin  100 mg capsule 3 times daily   FEN/GI: Heart healthy VTE Prophylaxis: Home eliquis   Disposition: Med-Tele  History of Present Illness:  Jacqueline [name] is a 89 y.o. female presenting with increasing weakness over the last month and new chest pressure and dyspnea the last few days. She describes a feeling of getting weaker over the last 6 months. Her chest pressure is constant, dull, mid sternal, non radiating and non TTP. She hasn't noticed anything making it worse or better. She has noticed it getting harder to sleep lying flat of late, though denies leg swelling. She reports increasing her lasix  frequency to daily about a month ago, with prior frequency uncertain. She endorses occasional palpitations, but doesn't feel them at the moment. She has a history of Afib but reports she can never feel when she goes into Afib. She takes Eliquis , though she and her son held her eliquis  for a week 2-3 weeks ago secondary to rectal bleeding.  She is otherwise very compliant with the medication.  She reports eating and drinking normally, though she only eats about 1 meal per day.  She reports drinking lots of water every day though son at bedside disagrees.  She denies any headaches or confusion. She endorses numbness and tingling in her legs and reports an established hx of neuropathy for which she takes gabapentin .   In the ED, she was found to be in Afib w/ otherwise stable vital signs. Workup available at admission included  CMP w/ Na 137 K3.8 Glucose 98 Cr 1.71 Alk phos 164 AST 132 ALT 145 CBC w/ WBC 8.2 H/h  12.4/37.5 Plt 142 BNP 366 Trop 16,14 UA w/ Hazy urine w/ moderate leuks and few bacteria. CXR showed cardiomegaly w/ no other acute findings, EKG demonstrated Afib w/ a normal rate, borderline prolonged QTc and no signs of acute ischemia.   Review Of Systems: Per HPI.  Pertinent Past Medical History: Paroxysmal A-fib Pulmonary arterial hypertension CAD Hypertension Hypothyroidism GERD Osteoporosis Chronic diastolic CHF  Pertinent Past Surgical History: Femur intramedullary nail 12/23 Cardiac catheterization 10/16 Left heart cath 2/16 Right total knee arthroplasty 12/15 CABG 2006 Bilateral rotator cuff repair 1999 and 2005 Abdominal hysterectomy 1975 Remainder reviewed in history tab.  Pertinent Social History: Tobacco use: No Alcohol  use: None Other Substance use: None Lives alone. Lives near son.  Pertinent Family History: Mother, deceased, Parkinson's disease Brother, deceased, heart attack, arthritis, cancer  Remainder reviewed in history tab.   Important Outpatient Medications: Xanax  0.5 mg tablet at bedtime as needed for anxiety Amiodarone  200 mg tablet daily - taking for long Amlodipine  5 mg tablet daily - taking Eliquis  2.5 mg tablet 2 times daily - has intermittently stopped when she has some rectal bleeding Lasix  20 mg tablet daily - did not take for while, has been taking recently over the past couple months Gabapentin  100 mg capsule 3 times daily Hydralazine  25 mg tablet 3 times daily as needed Levothyroxine  75 mcg tablet daily Omeprazole  20 mg capsule daily Pravastatin  40 mg tablet daily Ropinirole  1 mg tablet at bedtime and 0.25 mg tablet in the morning and afternoon Remainder reviewed in medication history.   Objective: BP 130/81   Pulse (!) 103   Temp 98.1 F (36.7 C)   Resp (!) 21   SpO2 97%  Exam: General: Awake, alert, no acute distress.  Communicates clearly.  Son at bedside. Cardiovascular: Irregular rhythm, no murmurs rubs or gallops  appreciated.  2+ radial and dorsalis pedis pulses bilaterally.  Good capillary refill in bilateral upper and lower extremities. Respiratory: To auscultation bilaterally, normal work of breathing on room air. Gastrointestinal: Soft, nontender, nondistended Neuro: Alert and oriented x 4, no focal deficits. Psych: Appropriate mood and affect.  Labs:  CBC BMET  Recent Labs  Lab 02/06/24 1110  WBC 8.2  HGB 12.4  HCT 37.5  PLT 142*   Recent Labs  Lab 02/06/24 1211  NA 137  K 3.8  CL 105  CO2 22  BUN 23  CREATININE 1.71*  GLUCOSE 98  CALCIUM  9.1    Alk phos 164 AST 132 ALT 145 CBC w/ H/h 12.4/37.5 Plt 142 BNP 366 Trop 16,14 UA w/ Hazy urine w/ moderate leuks and few bacteria.   EKG:  EKG demonstrated Afib w/ a normal rate, borderline prolonged QTc and no signs of acute ischemia.   Imaging Studies Performed: CXR Impression from Radiologist: Cardiomegaly w/ no other acute findings,    My Interpretation: Stable cardiomegaly vs 1/25 Cxr. No acute cardiopulmonary findings.    Manon Jester, DO 02/06/2024, 6:00 PM PGY-1, [facility] Medicine  FPTS Intern pager: [phone], text pages welcome Secure chat group [facility] Teaching Service   I have verified that the resident's  findings are accurately documented in the resident's note. I have made edits and changes where appropriate, and agree with plan.  Ozell Provencal, MD, PGY-3 [facility] Family Medicine 7:08 PM 02/06/2024

## 2024-02-06 NOTE — Hospital Course (Addendum)
 Jacqueline [name] is a 89 y.o. female who was admitted to the [facility] Medicine Teaching Service at [facility] for CHF exacerbation. Hospital course is outlined below by problem.   CHF exacerbation Presented with dyspnea, weakness, orthopnea, chest tightness.  BNP 366.  She was started on IV Lasix .  Repeat echo showed***.  Paroxysmal atrial fibrillation Initial EKG in ED suspicious for A-fib not in RVR.  Home amiodarone  was continued.  Home Eliquis  2.5 mg daily was restarted (stopped at home due to concern for rectal bleed).  Cardiology consulted who***.  Elevated LFTs Mildly elevated alk phos, AST, ALT.  Felt to be due to amiodarone  side effect.  Right upper quadrant ultrasound  was unremarkable.  Hepatitis panel demonstrated***.  Other conditions that were chronic and stable: Hypertension, hypothyroidism, GERD, neuropathy  Issues for follow up: Assess volume status Consider repeating LFTs Consider follow-up for 2006 CTA PE which reads: right lower lobe pulmonary nodule warrants further evaluation with a followup CT scan in three months to assess stability.***May not include this given her age 77

## 2024-02-07 ENCOUNTER — Encounter (HOSPITAL_COMMUNITY): Payer: Self-pay

## 2024-02-07 DIAGNOSIS — I4819 Other persistent atrial fibrillation: Secondary | ICD-10-CM

## 2024-02-07 DIAGNOSIS — R0789 Other chest pain: Secondary | ICD-10-CM | POA: Diagnosis not present

## 2024-02-07 DIAGNOSIS — I2510 Atherosclerotic heart disease of native coronary artery without angina pectoris: Secondary | ICD-10-CM

## 2024-02-07 DIAGNOSIS — R7401 Elevation of levels of liver transaminase levels: Secondary | ICD-10-CM | POA: Diagnosis not present

## 2024-02-07 DIAGNOSIS — I480 Paroxysmal atrial fibrillation: Secondary | ICD-10-CM

## 2024-02-07 DIAGNOSIS — E785 Hyperlipidemia, unspecified: Secondary | ICD-10-CM

## 2024-02-07 DIAGNOSIS — R0609 Other forms of dyspnea: Secondary | ICD-10-CM

## 2024-02-07 DIAGNOSIS — N1831 Chronic kidney disease, stage 3a: Secondary | ICD-10-CM

## 2024-02-07 DIAGNOSIS — I5033 Acute on chronic diastolic (congestive) heart failure: Secondary | ICD-10-CM

## 2024-02-07 LAB — TSH: TSH: 0.852 u[IU]/mL (ref 0.350–4.500)

## 2024-02-07 LAB — COMPREHENSIVE METABOLIC PANEL WITH GFR
ALT: 115 U/L — ABNORMAL HIGH (ref 0–44)
AST: 86 U/L — ABNORMAL HIGH (ref 15–41)
Albumin: 3.1 g/dL — ABNORMAL LOW (ref 3.5–5.0)
Alkaline Phosphatase: 160 U/L — ABNORMAL HIGH (ref 38–126)
Anion gap: 12 (ref 5–15)
BUN: 23 mg/dL (ref 8–23)
CO2: 24 mmol/L (ref 22–32)
Calcium: 8.8 mg/dL — ABNORMAL LOW (ref 8.9–10.3)
Chloride: 101 mmol/L (ref 98–111)
Creatinine, Ser: 1.78 mg/dL — ABNORMAL HIGH (ref 0.44–1.00)
GFR, Estimated: 27 mL/min — ABNORMAL LOW (ref >60)
Glucose, Bld: 111 mg/dL — ABNORMAL HIGH (ref 70–99)
Potassium: 4.1 mmol/L (ref 3.5–5.1)
Sodium: 137 mmol/L (ref 135–145)
Total Bilirubin: 0.7 mg/dL (ref 0.0–1.2)
Total Protein: 6.6 g/dL (ref 6.5–8.1)

## 2024-02-07 LAB — MAGNESIUM: Magnesium: 2.2 mg/dL (ref 1.7–2.4)

## 2024-02-07 LAB — CBC
HCT: 36.8 % (ref 36.0–46.0)
Hemoglobin: 12.4 g/dL (ref 12.0–15.0)
MCH: 32.3 pg (ref 26.0–34.0)
MCHC: 33.7 g/dL (ref 30.0–36.0)
MCV: 95.8 fL (ref 80.0–100.0)
Platelets: 145 K/uL — ABNORMAL LOW (ref 150–400)
RBC: 3.84 MIL/uL — ABNORMAL LOW (ref 3.87–5.11)
RDW: 14.9 % (ref 11.5–15.5)
WBC: 6.6 K/uL (ref 4.0–10.5)
nRBC: 0 % (ref 0.0–0.2)

## 2024-02-07 LAB — MRSA NEXT GEN BY PCR, NASAL: MRSA by PCR Next Gen: NOT DETECTED

## 2024-02-07 MED ORDER — FUROSEMIDE 20 MG PO TABS
20.0000 mg | ORAL_TABLET | Freq: Every day | ORAL | Status: DC
  Administered 2024-02-07 – 2024-02-08 (×2): 20 mg via ORAL
  Filled 2024-02-07 (×2): qty 1

## 2024-02-07 MED ORDER — ACETAMINOPHEN 500 MG PO TABS
500.0000 mg | ORAL_TABLET | Freq: Four times a day (QID) | ORAL | Status: DC | PRN
  Administered 2024-02-07 – 2024-02-08 (×2): 500 mg via ORAL
  Filled 2024-02-07 (×2): qty 1

## 2024-02-07 NOTE — Assessment & Plan Note (Addendum)
 Pt w/ recently progressive dyspnea, weakness, orthopnea and chest pressure. Echo 2023 60-65%. BNP 366.  -Admit to FMTS, attending Mcdiarmid -Continue home amlodipine  given stable Bps -Start IV lasix  40mg  and assess clinical response - since admission -Repeat Echo -Strict I/Os -Daily weights -AM CMP, Mag -TSH -Consider CTAPE rule out PE - Defer until after cardiology consult

## 2024-02-07 NOTE — Plan of Care (Signed)

## 2024-02-07 NOTE — Evaluation (Signed)
 Physical Therapy Evaluation Patient Details Name: Jacqueline [name] MRN: [id number] DOB: July 01, 1931 Today's Date: 02/07/2024  History of Present Illness  89 year old admitted 7/13 with worsening weakness and chest pressure. Acute on chronic diastolic heart failure. Pertinent past medical history includes paroxysmal A-fib, pulmonary arterial hypertension, coronary artery disease, hypertension, hypothyroidism and chronic diastolic congestive heart failure.   Clinical Impression  Pt admitted with above diagnosis. Mod I with rollator at home PTA. States she is able to care for herself, son lives next door and takes her to appointments. Denies falls in the past 6 months. Able to ambulate 90 feet with supervision and RW use today without LOB. SpO2 maintain 94% on RA throughout. States she feels close to her baseline. Recently finished working with HHPT which was helpful. May benefit from OPPT at this point to further progress strength and functional capacity. Pt currently with functional limitations due to the deficits listed below (see PT Problem List). Pt will benefit from acute skilled PT to increase their independence and safety with mobility to allow discharge.           If plan is discharge home, recommend the following: Assistance with cooking/housework;Assist for transportation;Help with stairs or ramp for entrance   Can travel by private vehicle        Equipment Recommendations None recommended by PT  Recommendations for Other Services       Functional Status Assessment Patient has had a recent decline in their functional status and demonstrates the ability to make significant improvements in function in a reasonable and predictable amount of time.     Precautions / Restrictions Precautions Precautions: Fall Recall of Precautions/Restrictions: Intact Restrictions Weight Bearing Restrictions Per Provider Order: No      Mobility  Bed Mobility Overal bed mobility: Modified  Independent             General bed mobility comments: extra time    Transfers Overall transfer level: Needs assistance Equipment used: Rolling walker (2 wheels) Transfers: Sit to/from Stand Sit to Stand: Supervision           General transfer comment: Supervision for safety no physical assist    Ambulation/Gait Ambulation/Gait assistance: Supervision Gait Distance (Feet): 90 Feet Assistive device: Rolling walker (2 wheels) Gait Pattern/deviations: Step-through pattern, Decreased stride length Gait velocity: Dec Gait velocity interpretation: <1.31 ft/sec, indicative of household ambulator   General Gait Details: Grossly stable with RW for support. No overt LOB or buckling noted. Slow gait speed, mild dyspnea, a little worse towards end of distance. States distance completed today is close to her limit at home. SpO2 94% on RA.  Stairs            Wheelchair Mobility     Tilt Bed    Modified Rankin (Stroke Patients Only)       Balance Overall balance assessment: Mild deficits observed, not formally tested                                           Pertinent Vitals/Pain Pain Assessment Pain Assessment: Faces Faces Pain Scale: Hurts a little bit Pain Location: chest Pain Descriptors / Indicators: Pressure Pain Intervention(s): Limited activity within patient's tolerance, Monitored during session    Home Living Family/patient expects to be discharged to:: Private residence Living Arrangements: Alone Available Help at Discharge: Family;Available PRN/intermittently Type of Home: House Home Access: Stairs to enter Entrance  Stairs-Rails: Left Entrance Stairs-Number of Steps: 4   Home Layout: One level Home Equipment: Rollator (4 wheels);Cane - single point;Hand held shower head;Grab bars - tub/shower      Prior Function Prior Level of Function : Independent/Modified Independent             Mobility Comments: Denies falls this  year. Uses rollator indoors. ADLs Comments: ind     Extremity/Trunk Assessment   Upper Extremity Assessment Upper Extremity Assessment: Defer to OT evaluation    Lower Extremity Assessment Lower Extremity Assessment: Generalized weakness       Communication   Communication Communication: No apparent difficulties    Cognition Arousal: Alert Behavior During Therapy: WFL for tasks assessed/performed   PT - Cognitive impairments: No apparent impairments                         Following commands: Intact       Cueing       General Comments General comments (skin integrity, edema, etc.): BP 119/75. HR 88.    Exercises General Exercises - Lower Extremity Ankle Circles/Pumps: AROM, Both, 10 reps, Supine Quad Sets: Strengthening, Both, 10 reps, Supine Gluteal Sets: Strengthening, Both, 10 reps, Supine   Assessment/Plan    PT Assessment Patient needs continued PT services  PT Problem List Decreased strength;Decreased activity tolerance;Decreased balance;Decreased mobility;Cardiopulmonary status limiting activity       PT Treatment Interventions DME instruction;Gait training;Stair training;Functional mobility training;Therapeutic activities;Therapeutic exercise;Balance training;Neuromuscular re-education;Patient/family education    PT Goals (Current goals can be found in the Care Plan section)  Acute Rehab PT Goals Patient Stated Goal: Go home PT Goal Formulation: With patient Time For Goal Achievement: 02/21/24 Potential to Achieve Goals: Good    Frequency Min 1X/week     Co-evaluation               AM-PAC PT 6 Clicks Mobility  Outcome Measure Help needed turning from your back to your side while in a flat bed without using bedrails?: None Help needed moving from lying on your back to sitting on the side of a flat bed without using bedrails?: None Help needed moving to and from a bed to a chair (including a wheelchair)?: A Little Help needed  standing up from a chair using your arms (e.g., wheelchair or bedside chair)?: A Little Help needed to walk in hospital room?: A Little Help needed climbing 3-5 steps with a railing? : A Little 6 Click Score: 20    End of Session Equipment Utilized During Treatment: Gait belt Activity Tolerance: Patient tolerated treatment well Patient left: in bed;with call bell/phone within reach;with bed alarm set Nurse Communication: Mobility status PT Visit Diagnosis: Other abnormalities of gait and mobility (R26.89);Muscle weakness (generalized) (M62.81);Pain Pain - part of body:  (chest pressure)    Time: 8599-8578 PT Time Calculation (min) (ACUTE ONLY): 21 min   Charges:   PT Evaluation $PT Eval Low Complexity: 1 Low   PT General Charges $$ ACUTE PT VISIT: 1 Visit         Leontine Roads, PT, DPT [facility]  Rehabilitation Services Physical Therapist Office: [phone] Website: [email or web address]   Leontine GORMAN Roads 02/07/2024, 3:33 PM

## 2024-02-07 NOTE — Assessment & Plan Note (Addendum)
 VSS, not in RVR. Unclear length of anticoagulation as patient has intermittently held at home for bleeding. May require cardioversion versus transition off of amiodarone  due to elevated LFTs. -Patient's HR elevating >110 with activity -Consult cardiology for guidance on possible cardioversion vs anticoagulation -Continue home amiodarone  for now -Cardiac monitoring  -Continue Eliquis  2.5mg  daily

## 2024-02-07 NOTE — TOC CM/SW Note (Signed)
 Transition of Care [facility]) - Inpatient Brief Assessment   Patient Details  Name: Ayva Veilleux MRN: [id number] Date of Birth: 1930/11/29  Transition of Care [facility]) CM/SW Contact:    Lauraine FORBES Saa, LCSW Phone Number: 02/07/2024, 4:46 PM   Clinical Narrative:  4:46 PM Per chart review, patient resides at home alone. Patient has a PCP and insurance. Patient has SNF history at [facility] and [facility]. Patient has HH history with [facility] and [facility]. Patient has DME (rolling walker, 3 in home) history. Patient's preferred pharmacy is [facility]. Physical therapy recommended patient discharge home with OPPT. TOC will continue to follow and be available to assist.  Transition of Care Asessment: Insurance and Status: Insurance coverage has been reviewed Patient has primary care physician: Yes Home environment has been reviewed: Private Residence Prior level of function:: Independent/Modified Independent Prior/Current Home Services: No current home services (Has HH/DME history) Social Drivers of Health Review: SDOH reviewed no interventions necessary Readmission risk has been reviewed: Yes Transition of care needs: transition of care needs identified, TOC will continue to follow

## 2024-02-07 NOTE — Progress Notes (Signed)
 Daily Progress Note Intern Pager: [phone]  Patient name: Jacqueline [name] Medical record number: [id number] Date of birth: 12/24/1930 Age: 89 y.o. Gender: female  Primary Care Provider: Madelon Donald HERO, DO Consultants: Cardiology Code Status: Full  Pt Overview and Major Events to Date:  7/14-admitted  Medical Decision Making: Jacqueline [name] is a 89 y.o. female presenting with a month of worsening weakness and chest pressure. Differential for this patient's presentation of this includes CHF exacerbation (prior diastolic HF, slowly progressive orthopnea, dyspnea, and CP, BNP 366), Afib (chest pain in the setting of established Afib), ACS (chest pain, weakness, risk factors present, unlikely due to trops and EKG reassuring), and anemia (weakness, chest pain, poor po intake, however Hgb WNL 12.4).     Pertinent past medical history includes paroxysmal A-fib, pulmonary arterial hypertension, coronary artery disease, hypertension, hypothyroidism and chronic diastolic congestive heart failure. Assessment & Plan Acute on chronic diastolic heart failure (HCC) Chest tightness Pt w/ recently progressive dyspnea, weakness, orthopnea and chest pressure. Echo 2023 60-65%. BNP 366.  -Admit to FMTS, attending Mcdiarmid -Continue home amlodipine  given stable Bps -Start IV lasix  40mg  and assess clinical response - since admission -Repeat Echo -Strict I/Os -Daily weights -AM CMP, Mag -TSH -Consider CTAPE rule out PE - Defer until after cardiology consult Atrial fibrillation (HCC) VSS, not in RVR. Unclear length of anticoagulation as patient has intermittently held at home for bleeding. May require cardioversion versus transition off of amiodarone  due to elevated LFTs. -Patient's HR elevating >110 with activity -Consult cardiology for guidance on possible cardioversion vs anticoagulation -Continue home amiodarone  for now -Cardiac monitoring  -Continue Eliquis  2.5mg   daily Transaminitis Alk phos 164-> 160 AST 132->86 ALT 145->115.  Possible explanations include amiodarone  side effect, viral, high home tylenol  dosage, cholestasis or biliary causes. -RUQ US  negative -Hepatitis panel negative - AM CMP -Only supplements she takes are fish oil, geritol and cranberry for uti, unlikely culprit.  Chronic health problem Hypertension - Amlodipine  5 mg tablet daily - taking Hypothyroidism - Levothyroxine  75 mcg tablet daily GERD - Omeprazole  20 mg capsule daily Chronic diastolic CHF - Lasix  20 mg tablet daily Neuropathy - Gabapentin  100 mg capsule 3 times daily  FEN/GI: Heart healthy PPx: Home Eliquis  Dispo:Home pending clinical improvement . Barriers include atrial fibrillation with elevated heart rates, .   Subjective:  Patient reports NAEON. She endorses mild residual chest pressure and orthopnea without chest pain and palpitations.   Objective: Temp:  [97.6 F (36.4 C)-98.3 F (36.8 C)] 98.3 F (36.8 C) (07/14 0604) Pulse Rate:  [75-122] 84 (07/14 0605) Resp:  [16-29] 17 (07/14 0605) BP: (109-131)/(71-94) 122/84 (07/14 0600) SpO2:  [92 %-100 %] 98 % (07/14 9394) Physical Exam: General: Awake, alert, no acute distress. Communicating clearly. Son at bedside.  Cardiovascular: Irregular rhythm, no m/r/g.  2+ radial and dorsalis pedis pulses bilaterally with good capillary refill.  Respiratory: CTA b/l normal wob on room air.  Abdomen: Soft, non tender, non distended  Laboratory: Most recent CBC Lab Results  Component Value Date   WBC 6.6 02/07/2024   HGB 12.4 02/07/2024   HCT 36.8 02/07/2024   MCV 95.8 02/07/2024   PLT 145 (L) 02/07/2024   Most recent BMP    Latest Ref Rng & Units 02/07/2024    4:28 AM  BMP  Glucose 70 - 99 mg/dL 888   BUN 8 - 23 mg/dL 23   Creatinine 9.55 - 1.00 mg/dL 8.21   Sodium 864 - 854 mmol/L 137  Potassium 3.5 - 5.1 mmol/L 4.1   Chloride 98 - 111 mmol/L 101   CO2 22 - 32 mmol/L 24   Calcium  8.9 - 10.3 mg/dL  8.8   Mg 2.2 ALT 854->884 AST 132 ->86 Alk Phos 164 -> 160 Hepatitis panel negative  MRSA nares negative  Imaging/Diagnostic Tests:  Radiologist Impression: Cardiomegaly without acute abnormality of the lungs. My interpretation: Cardiac silhouette slightly enlarged from prior 1/25. No pneumothorax or consolidation noted.   RUQ US : Negative  Manon Jester, DO 02/07/2024, 7:22 AM  PGY-1, [facility] FPTS Intern pager: [phone], text pages welcome Secure chat group [facility] Teaching Service

## 2024-02-07 NOTE — Assessment & Plan Note (Signed)
 Hypertension - Amlodipine  5 mg tablet daily - taking Hypothyroidism - Levothyroxine  75 mcg tablet daily GERD - Omeprazole  20 mg capsule daily Chronic diastolic CHF - Lasix  20 mg tablet daily Neuropathy - Gabapentin  100 mg capsule 3 times daily

## 2024-02-07 NOTE — Consult Note (Signed)
 Cardiology Consultation   Patient ID: Jacqueline [name] MRN: [id number]; DOB: September 06, 1930  Admit date: 02/06/2024 Date of Consult: 02/07/2024  PCP:  Madelon Donald HERO, DO   [facility] Providers Cardiologist:  Jerel Balding, MD  Cardiology APP:  Madie Jon Garre, PA       History of Present Illness: Jacqueline [name] 89 year old with coronary disease prior bypass surgery 2006 with paroxysmal atrial fibrillation on amiodarone  at home with previous cardioversion, diastolic heart failure, hypertension who over the past month has been having gradual worsening generalized weakness.  Felt weakness in her arms and legs.  No chest pain.  Decreased appetite.  Recent held Eliquis  because of some bleeding in her gums.  Feels better with 1 dose of IV Lasix  40 mg.  Agree that she does not appear to be volume overloaded at this point.  TSH is normal at 0.85   Past Medical History:  Diagnosis Date   Abnormal ECG 10/22/2021   Arthritis    back   Atrial fibrillation with rapid ventricular response (HCC) 02/24/2014   a. CHA2DS2VASc = 6 -> on eliquis ;  b. 02/2014 s/p DCCV;  c. 08/2014 Echo: EF 55-60%, Gr 2 DD, mild MR, triv AI.   Bronchitis 10/22/2021   CAD (coronary artery disease)    a. s/p CABG x 2 (LIMA->LAD, VG->Diag);  b. 08/2014 Cath: LM nl, LAD 90p, LCX nl, RCA nl/dominant, LIMA->LAD atretic, VG->D2 patent w/ retrograde filling of LAD, EF 55-60%-->Med Rx.   Cyclic citrullinated peptide (CCP) antibody positive 2016   Diverticulitis 02/21/2012   Elevated troponin 10/22/2021   GERD (gastroesophageal reflux disease)    Hyperlipemia    Hypertension    Insomnia    Vertigo 11/24/2018    Past Surgical History:  Procedure Laterality Date   ABDOMINAL HYSTERECTOMY  1975   CARDIAC CATHETERIZATION N/A 05/09/2015   Procedure: Right Heart Cath;  Surgeon: Ezra GORMAN Shuck, MD;  [facility]VAS[facility] CV LAB;  Service: Cardiovascular;  Laterality: N/A;   CARDIOVERSION N/A 02/28/2014   Procedure:  CARDIOVERSION;  Surgeon: Jerel Balding, MD;  Location: [facility] ENDOSCOPY;  Service: Cardiovascular;  Laterality: N/A;   CARDIOVERSION N/A 01/30/2019   Procedure: CARDIOVERSION;  Surgeon: Okey Vina GAILS, MD;  Location: [facility] ENDOSCOPY;  Service: Cardiovascular;  Laterality: N/A;   CATARACT EXTRACTION Bilateral 2014   with lens implanted   COLONOSCOPY N/A 02/19/2015   Procedure: COLONOSCOPY;  Surgeon: Gwendlyn ONEIDA Buddy, MD;  Location: [facility] ENDOSCOPY;  Service: Endoscopy;  Laterality: N/A;   CORONARY ARTERY BYPASS GRAFT  2006   off-pump bypass surgery with LIMA to the LAD and SVG to the second diagonal artery ( performed by Dr Army)    FEMUR IM NAIL Right 07/18/2022   Procedure: INTRAMEDULLARY (IM) NAIL, HIP;  Surgeon: Beverley Evalene BIRCH, MD;  Location: [facility] OR;  Service: Orthopedics;  Laterality: Right;   JOINT REPLACEMENT Bilateral    L-2004, R-2006   LEFT HEART CATHETERIZATION WITH CORONARY/GRAFT ANGIOGRAM N/A 09/17/2014   Procedure: LEFT HEART CATHETERIZATION WITH EL BILE;  Surgeon: Debby DELENA Sor, MD;  Location: [facility] CATH LAB;  Service: Cardiovascular;  Laterality: N/A;   REIMPLANTATION OF TOTAL KNEE Right 10/08/2014   Procedure: REIMPLANTATION OF RIGHT TOTAL KNEE ARTHROPLASTY WITH REMOVAL OF ANTIBIOTIC SPACER;  Surgeon: Donnice Car, MD;  Location: [facility] ORS;  Service: Orthopedics;  Laterality: Right;   ROTATOR CUFF REPAIR Bilateral    r-1999, l- 2005   TEE WITHOUT CARDIOVERSION N/A 02/28/2014   Procedure: TRANSESOPHAGEAL ECHOCARDIOGRAM (TEE);  Surgeon: Jerel Balding, MD;  Location: [facility] ENDOSCOPY;  Service: Cardiovascular;  Laterality: N/A;   TEE WITHOUT CARDIOVERSION N/A 01/30/2019   Procedure: TRANSESOPHAGEAL ECHOCARDIOGRAM (TEE);  Surgeon: Okey Vina GAILS, MD;  Location: [facility] ENDOSCOPY;  Service: Cardiovascular;  Laterality: N/A;   TOTAL KNEE ARTHROPLASTY Right 07/02/2014   Procedure: Resection of Infected Right Total Knee Arthroplasty with placement antibiotic spacer;  Surgeon: Donnice JONETTA Car, MD;   Location: [facility] ORS;  Service: Orthopedics;  Laterality: Right;     Home Medications:  Prior to Admission medications   Medication Sig Start Date End Date Taking? Authorizing Provider  acetaminophen  (TYLENOL ) 500 MG tablet Take 500 mg by mouth in the morning and at bedtime.   Yes [provider]  acetaminophen  (TYLENOL ) 650 MG CR tablet Take 650 mg by mouth in the morning and at bedtime.   Yes [provider]  ALPRAZolam  (XANAX ) 0.5 MG tablet TAKE 1 TABLET BY MOUTH AT BEDTIME AS NEEDED FOR ANXIETY Patient taking differently: Take 0.5 mg by mouth at bedtime. 11/08/23  Yes Rumball, Alison M, DO  amiodarone  (PACERONE ) 200 MG tablet TAKE 1 TABLET BY MOUTH EVERY DAY 01/26/24  Yes Rumball, Donald HERO, DO  amLODipine  (NORVASC ) 5 MG tablet TAKE 1 TABLET (5 MG TOTAL) BY MOUTH DAILY. 03/01/23  Yes Croitoru, Mihai, MD  apixaban  (ELIQUIS ) 2.5 MG TABS tablet Take 1 tablet (2.5 mg total) by mouth 2 (two) times daily. 01/11/23  Yes Croitoru, Mihai, MD  cycloSPORINE  (RESTASIS ) 0.05 % ophthalmic emulsion Place 1 drop into both eyes 2 (two) times daily.   Yes [provider]  diclofenac  Sodium (VOLTAREN ) 1 % GEL Apply 4 g topically 4 (four) times daily. Patient taking differently: Apply 1 Application topically in the morning, at noon, and at bedtime. 01/25/24  Yes Howell Lunger, DO  furosemide  (LASIX ) 20 MG tablet Take 1 tablet (20 mg total) by mouth daily. 09/01/23  Yes Croitoru, Mihai, MD  gabapentin  (NEURONTIN ) 100 MG capsule TAKE 1 CAPSULE (100 MG TOTAL) BY MOUTH 3 TIMES A DAY 06/28/23  Yes Rumball, Donald HERO, DO  hydrALAZINE  (APRESOLINE ) 25 MG tablet Take 1 tablet (25 mg total) by mouth 3 (three) times daily as needed (may take up to three times daily for BP >160). Patient taking differently: Take 25 mg by mouth in the morning, at noon, and at bedtime. 06/01/23 02/06/24 Yes Croitoru, Mihai, MD  levothyroxine  (SYNTHROID ) 75 MCG tablet Take 1 tablet (75 mcg total) by mouth daily. 06/21/23  Yes  Rumball, Alison M, DO  Omega-3 Fatty Acids (FISH OIL) 1000 MG CAPS Take 1,000 mg by mouth every evening.   Yes [provider]  omeprazole  (PRILOSEC) 20 MG capsule TAKE 1 CAPSULE BY MOUTH EVERY DAY 08/31/23  Yes Rumball, Donald HERO, DO  Polyethyl Glycol-Propyl Glycol (LUBRICANT EYE DROPS) 0.4-0.3 % SOLN Place 1 drop into both eyes daily as needed (dry eye).   Yes [provider]  polyethylene glycol (MIRALAX  / GLYCOLAX ) 17 g packet Take 17 g by mouth 2 (two) times daily. Patient taking differently: Take 17 g by mouth daily as needed for mild constipation or moderate constipation. 02/13/22  Yes Krishnan, Gokul, MD  pravastatin  (PRAVACHOL ) 40 MG tablet Take 1 tablet (40 mg total) by mouth every evening. 10/11/23  Yes Croitoru, Mihai, MD  rOPINIRole  (REQUIP ) 0.25 MG tablet Take 1 tablet by mouth in the morning and afternoon. 12/02/23  Yes Madelon Donald HERO, DO  rOPINIRole  (REQUIP ) 1 MG tablet Take 1 tablet (1 mg total) by mouth at bedtime. 12/02/23  Yes Rumball,  Donald HERO, DO  nitroGLYCERIN  (NITROSTAT ) 0.4 MG SL tablet Place 1 tablet (0.4 mg total) under the tongue every 5 (five) minutes as needed for chest pain. Patient not taking: Reported on 02/06/2024 12/03/21   Croitoru, Jerel, MD  potassium chloride  SA (KLOR-CON  M) 20 MEQ tablet Take 1 tablet (20 mEq total) by mouth daily. Patient not taking: Reported on 02/06/2024 08/26/23   Pollina, Christopher J, MD    Scheduled Meds:  amiodarone   200 mg Oral Daily   amLODipine   5 mg Oral Daily   apixaban   2.5 mg Oral BID   cycloSPORINE   1 drop Both Eyes BID   furosemide   20 mg Oral Daily   gabapentin   100 mg Oral TID   levothyroxine   75 mcg Oral Daily   pravastatin   40 mg Oral QPM   rOPINIRole   1 mg Oral QHS   Continuous Infusions:  PRN Meds: ALPRAZolam , artificial tears, polyethylene glycol  Allergies:    Allergies  Allergen Reactions   Atorvastatin Other (See Comments)    Myalgias in legs   Beta Adrenergic Blockers Other (See Comments)     Severe bradycardia   Crestor [Rosuvastatin] Other (See Comments)    Myalgias in legs   Morphine And Codeine Other (See Comments)    Crazy thoughts, severe headache, sick   Morphine Sulfate Other (See Comments)    Crazy thoughts, severe headache, sick   Prednisone  Other (See Comments)    On two occasions has caused A fib    Social History:   Social History   Socioeconomic History   Marital status: Widowed    Spouse name: Not on file   Number of children: 2   Years of education: 10   Highest education level: 10th grade  Occupational History   Not on file  Tobacco Use   Smoking status: Never   Smokeless tobacco: Never  Vaping Use   Vaping status: Never Used  Substance and Sexual Activity   Alcohol  use: No   Drug use: No   Sexual activity: Not Currently    Birth control/protection: Post-menopausal  Other Topics Concern   Not on file  Social History Narrative   Current Social History 05/11/2017           Patient lives alone in one level home    Transportation: Patient has own vehicle and drives herself    Important Relationships Children, grandchildren, Art gallery manager and daughter-in-law are all nearby   Pets: None 05/11/2017   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 Drinks water and one cup of coffee daily.   Religious / Personal Beliefs: Baptist. I believe Jesus died on the cross to save me from my sins. I am saved by the grace of God.    Other: I have a good life and am happy. My family is there when I need help.                                                              Social Drivers of Corporate investment banker Strain: Low Risk  (01/27/2024)   Overall Financial Resource Strain (CARDIA)  Difficulty of Paying Living Expenses: Not hard at all  Food Insecurity: No Food Insecurity (01/27/2024)    Hunger Vital Sign    Worried About Running Out of Food in the Last Year: Never true    Ran Out of Food in the Last Year: Never true  Transportation Needs: No Transportation Needs (01/27/2024)   PRAPARE - Administrator, Civil Service (Medical): No    Lack of Transportation (Non-Medical): No  Physical Activity: Inactive (10/28/2023)   Exercise Vital Sign    Days of Exercise per Week: 0 days    Minutes of Exercise per Session: 0 min  Stress: No Stress Concern Present (10/28/2023)   [employer] of Occupational Health - Occupational Stress Questionnaire    Feeling of Stress : Not at all  Social Connections: Moderately Integrated (10/28/2023)   Social Connection and Isolation Panel    Frequency of Communication with Friends and Family: More than three times a week    Frequency of Social Gatherings with Friends and Family: More than three times a week    Attends Religious Services: More than 4 times per year    Active Member of [employer] or Organizations: Yes    Attends Banker Meetings: More than 4 times per year    Marital Status: Widowed  Intimate Partner Violence: Not At Risk (01/27/2024)   Humiliation, Afraid, Rape, and Kick questionnaire    Fear of Current or Ex-Partner: No    Emotionally Abused: No    Physically Abused: No    Sexually Abused: No    Family History:    Family History  Problem Relation Age of Onset   Parkinson's disease Mother    Heart attack Brother    Arthritis Brother    Cancer Brother        Unknown   Breast cancer Neg Hx      ROS:  Please see the history of present illness.   All other ROS reviewed and negative.     Physical Exam/Data: Vitals:   02/07/24 0800 02/07/24 0915 02/07/24 0957 02/07/24 1222  BP: 129/88 113/68  123/75  Pulse: (!) 125 97  95  Resp: (!) 24 20  18   Temp:   98.5 F (36.9 C) 98.4 F (36.9 C)  TempSrc:   Oral Oral  SpO2: 97% 99%  94%  Weight:    49.8 kg  Height:    5' 1 (1.549 m)    Intake/Output  Summary (Last 24 hours) at 02/07/2024 1447 Last data filed at 02/06/2024 1633 Gross per 24 hour  Intake --  Output 300 ml  Net -300 ml      02/07/2024   12:22 PM 01/25/2024    9:43 AM 01/11/2024   10:31 AM  Last 3 Weights  Weight (lbs) 109 lb 12.6 oz 114 lb 113 lb  Weight (kg) 49.8 kg 51.71 kg 51.256 kg     Body mass index is 20.74 kg/m.  General:  Well nourished, well developed, in no acute distress HEENT: normal Neck: no JVD Vascular: No carotid bruits; Distal pulses 2+ bilaterally Cardiac:  normal S1, S2; irregularly irregular; no murmur  Lungs:  clear to auscultation bilaterally, no wheezing, rhonchi or rales  Abd: soft, nontender, no hepatomegaly  Ext: no edema Musculoskeletal:  No deformities, BUE and BLE strength normal and equal Skin: warm and dry  Neuro:  CNs 2-12 intact, no focal abnormalities noted Psych:  Normal affect   EKG:  The EKG was personally reviewed and demonstrates:  Atrial fibrillation rapid ventricular response heart rate 103 on ECG from February 06, 2024  Telemetry:  Telemetry was personally reviewed and demonstrates: Atrial fibrillation current heart rates in the 90s  Relevant CV Studies:  Echo 2023-EF 60 to 65% trivial mitral regurgitation trivial AI  Laboratory Data: High Sensitivity Troponin:   Recent Labs  Lab 02/06/24 1110 02/06/24 1303  TROPONINIHS 16 14     Chemistry Recent Labs  Lab 02/06/24 1110 02/06/24 1211 02/06/24 1634 02/07/24 0428 02/07/24 0458  NA 138 137  --  137  --   K 4.0 3.8  --  4.1  --   CL 103 105  --  101  --   CO2 23 22  --  24  --   GLUCOSE 99 98  --  111*  --   BUN 23 23  --  23  --   CREATININE 1.77* 1.71*  --  1.78*  --   CALCIUM  9.2 9.1  --  8.8*  --   MG  --   --  2.0  --  2.2  GFRNONAA 27* 28*  --  27*  --   ANIONGAP 12 10  --  12  --     Recent Labs  Lab 02/06/24 1211 02/07/24 0428  PROT 6.8 6.6  ALBUMIN  3.4* 3.1*  AST 132* 86*  ALT 145* 115*  ALKPHOS 164* 160*  BILITOT 0.8 0.7   Lipids No  results for input(s): CHOL, TRIG, HDL, LABVLDL, LDLCALC, CHOLHDL in the last 168 hours.  Hematology Recent Labs  Lab 02/06/24 1110 02/07/24 0428  WBC 8.2 6.6  RBC 3.86* 3.84*  HGB 12.4 12.4  HCT 37.5 36.8  MCV 97.2 95.8  MCH 32.1 32.3  MCHC 33.1 33.7  RDW 15.2 14.9  PLT 142* 145*   Thyroid   Recent Labs  Lab 02/07/24 0428  TSH 0.852    BNP Recent Labs  Lab 02/06/24 1211  BNP 366.4*    DDimer No results for input(s): DDIMER in the last 168 hours.  Radiology/Studies:  US  Abdomen Limited RUQ (LIVER/GB) Result Date: 02/06/2024 CLINICAL DATA:  Transaminitis. EXAM: ULTRASOUND ABDOMEN LIMITED RIGHT UPPER QUADRANT COMPARISON:  None Available. FINDINGS: Gallbladder: No gallstones or wall thickening visualized (2.0 mm). No sonographic Murphy sign noted by sonographer. Common bile duct: Diameter: 1.3 mm (limited visualization) Liver: No focal lesion identified. Within normal limits in parenchymal echogenicity. Portal vein is patent on color Doppler imaging with normal direction of blood flow towards the liver. Other: None. IMPRESSION: Unremarkable right upper quadrant ultrasound. Electronically Signed   By: Suzen Dials M.D.   On: 02/06/2024 17:37   DG Chest 2 View Result Date: 02/06/2024 CLINICAL DATA:  Palpitations EXAM: CHEST - 2 VIEW COMPARISON:  08/25/2023 FINDINGS: Cardiomegaly status post median sternotomy. No acute airspace opacity. Unchanged thin walled cyst in the superior segment right lower lobe. The visualized skeletal structures are unremarkable. IMPRESSION: Cardiomegaly without acute abnormality of the lungs. Electronically Signed   By: Marolyn JONETTA Jaksch M.D.   On: 02/06/2024 12:03     Assessment and Plan:  89 year old here with acute on chronic diastolic heart failure, atrial fibrillation with transaminitis  Atrial fibrillation persistent - Currently on amiodarone  200 mg a day.  Has been on this at home.  Also on amlodipine  5 mg a day.  Can increase  serum levels of amiodarone .  Previously had symptomatic bradycardia when she was on 40 mg of daily dose amiodarone .  TSH has been normal. - On home Eliquis  2.5  mg twice a day.  Dose adjusted. -Heart rates have been from 100 to 115 bpm, reasonably rate controlled.  As noted above, has had some symptomatic bradycardia in the past with increasing dose of amiodarone .  I think we should just keep it at current dose strategy and tolerate heart rates less than 120 on average.   Acute on chronic diastolic heart failure - 1 dose of IV Lasix  40 mg.  Seems to be euvolemic.  Doing well.  In the past has been medically limited by hypotension.  Avoid SGLT2 imagers because of frequent UTIs.  Secondary pulmonary hypertension - Likely secondary to left heart disease.  Hyperlipidemia - On pravastatin  40 mg a day.  Coronary artery disease - On amlodipine  as antianginal.  Rare chest discomfort.  Chronic kidney disease stage III AB - Creatinine 1.78.  Orthostatic hypotension - Has had episodes in the past aggressive treatment of her blood pressure in the past has led to increased frequency of falls.  Transaminitis - ALT 115 down from 145 yesterday.  Unsure etiology.  Could be viral syndrome.  He is on amiodarone  however numbers are quite low and descending.  I would be fine with continuing amiodarone  given her atrial fibrillation and if ALT dramatically starts to rise again, we may need to hold for a while.  Risk Assessment/Risk Scores:      New [place]  Heart Association (NYHA) Functional Class NYHA Class III      For questions or updates, please contact [facility] Please consult [email or web address] for contact info under    Signed, Oneil Parchment, MD  02/07/2024 2:47 PM

## 2024-02-07 NOTE — Assessment & Plan Note (Addendum)
 Alk phos 164-> 160 AST 132->86 ALT 145->115.  Possible explanations include amiodarone  side effect, viral, high home tylenol  dosage, cholestasis or biliary causes. -RUQ US  negative -Hepatitis panel negative - AM CMP -Only supplements she takes are fish oil, geritol and cranberry for uti, unlikely culprit.

## 2024-02-08 ENCOUNTER — Inpatient Hospital Stay (HOSPITAL_COMMUNITY)

## 2024-02-08 DIAGNOSIS — R7401 Elevation of levels of liver transaminase levels: Secondary | ICD-10-CM | POA: Diagnosis not present

## 2024-02-08 DIAGNOSIS — I4891 Unspecified atrial fibrillation: Secondary | ICD-10-CM | POA: Diagnosis not present

## 2024-02-08 DIAGNOSIS — R0609 Other forms of dyspnea: Secondary | ICD-10-CM | POA: Diagnosis not present

## 2024-02-08 DIAGNOSIS — I480 Paroxysmal atrial fibrillation: Secondary | ICD-10-CM | POA: Diagnosis not present

## 2024-02-08 DIAGNOSIS — Z22358 Carrier of other enterobacterales: Secondary | ICD-10-CM

## 2024-02-08 LAB — ECHOCARDIOGRAM COMPLETE
AR max vel: 2.59 cm2
AV Peak grad: 4.2 mmHg
Ao pk vel: 1.03 m/s
Area-P 1/2: 4.21 cm2
Height: 61 in
S' Lateral: 2.3 cm
Weight: 1382.4 [oz_av]

## 2024-02-08 LAB — URINE CULTURE: Culture: >=100000 — AB

## 2024-02-08 LAB — COMPREHENSIVE METABOLIC PANEL WITH GFR
ALT: 88 U/L — ABNORMAL HIGH (ref 0–44)
AST: 72 U/L — ABNORMAL HIGH (ref 15–41)
Albumin: 2.9 g/dL — ABNORMAL LOW (ref 3.5–5.0)
Alkaline Phosphatase: 141 U/L — ABNORMAL HIGH (ref 38–126)
Anion gap: 12 (ref 5–15)
BUN: 28 mg/dL — ABNORMAL HIGH (ref 8–23)
CO2: 25 mmol/L (ref 22–32)
Calcium: 9 mg/dL (ref 8.9–10.3)
Chloride: 100 mmol/L (ref 98–111)
Creatinine, Ser: 1.76 mg/dL — ABNORMAL HIGH (ref 0.44–1.00)
GFR, Estimated: 27 mL/min — ABNORMAL LOW (ref >60)
Glucose, Bld: 96 mg/dL (ref 70–99)
Potassium: 3.6 mmol/L (ref 3.5–5.1)
Sodium: 137 mmol/L (ref 135–145)
Total Bilirubin: 0.6 mg/dL (ref 0.0–1.2)
Total Protein: 6.5 g/dL (ref 6.5–8.1)

## 2024-02-08 LAB — MAGNESIUM: Magnesium: 2.1 mg/dL (ref 1.7–2.4)

## 2024-02-08 MED ORDER — ENSURE PLUS HIGH PROTEIN PO LIQD
237.0000 mL | Freq: Two times a day (BID) | ORAL | Status: DC
  Administered 2024-02-08: 237 mL via ORAL

## 2024-02-08 MED ORDER — PANTOPRAZOLE SODIUM 40 MG PO TBEC
40.0000 mg | DELAYED_RELEASE_TABLET | Freq: Every day | ORAL | Status: DC
  Administered 2024-02-08: 40 mg via ORAL
  Filled 2024-02-08: qty 1

## 2024-02-08 MED ORDER — ACETAMINOPHEN 325 MG PO TABS: 650.0000 mg | ORAL_TABLET | Freq: Four times a day (QID) | ORAL | Status: AC | PRN

## 2024-02-08 NOTE — Assessment & Plan Note (Addendum)
 Pt w/ recently progressive dyspnea, weakness, orthopnea and chest pressure. Echo 2023 60-65%. BNP 366.  -Admit to FMTS, attending Mcdiarmid -Continue home amlodipine  given stable Bps -PO lasix  20mg   -Patient appears euvolemic, no LE edema - since admission -Repeat Echo 7/15 AM then D/C -Strict I/Os -Daily weights -AM CMP, Mag

## 2024-02-08 NOTE — Progress Notes (Signed)
 AVS gone over with pt. All questions answered. Pt waiting on son to arrive with clothes for her and then she will D/C home with her son.

## 2024-02-08 NOTE — TOC Transition Note (Signed)
 Transition of Care [facility]) - Discharge Note   Patient Details  Name: Jacqueline [name] MRN: [id number] Date of Birth: June 29, 1931  [facility]) CM/SW Contact:  Roxie KANDICE Stain, RN Phone Number: 02/08/2024, 3:25 PM   Clinical Narrative:    Patient stable for discharge.  Patient is agreeable to home health and rollator order.  Notified Zack with [facility] of DME order and need to delivered to the home.  This RNCM offered choice for Home Health, Montoya [place] states she has used [facility], RNCM made referral to, She is able to take referral. Patient's daughter in law assists with transportation.   Address, Phone number and PCP verified.   Final next level of care: Home w Home Health Services Barriers to Discharge: Barriers Resolved   Patient Goals and CMS Choice Patient states their goals for this hospitalization and ongoing recovery are:: return home CMS Medicare.gov Compare Post Acute Care list provided to:: Patient Choice offered to / list presented to : Patient      Discharge Placement               home        Discharge Plan and Services Additional resources added to the After Visit Summary for       Post Acute Care Choice: Home Health, Durable Medical Equipment          DME Arranged: Walker rolling with seat DME Agency: [facility] Date DME Agency Contacted: 02/08/24 Time DME Agency Contacted: 1524 Representative spoke with at DME Agency: Zack HH Arranged: PT HH Agency: [facility] Date [facility] Agency Contacted: 02/08/24 Time HH Agency Contacted: 1524 Representative spoke with at [facility] Agency: kelly  Social Drivers of Health (SDOH) Interventions SDOH Screenings   Food Insecurity: No Food Insecurity (02/07/2024)  Housing: Low Risk  (02/07/2024)  Transportation Needs: No Transportation Needs (02/07/2024)  Utilities: Not At Risk (02/07/2024)  Alcohol  Screen: Low Risk  (10/28/2023)  Depression (PHQ2-9): Medium Risk (01/25/2024)  Financial Resource  Strain: Low Risk  (01/27/2024)  Physical Activity: Inactive (10/28/2023)  Social Connections: Moderately Integrated (02/07/2024)  Stress: No Stress Concern Present (10/28/2023)  Tobacco Use: Low Risk  (02/06/2024)  Health Literacy: Adequate Health Literacy (10/28/2023)     Readmission Risk Interventions    02/08/2024    3:25 PM  Readmission Risk Prevention Plan  Transportation Screening Complete  PCP or Specialist Appt within 5-7 Days Complete  Home Care Screening Complete  Medication Review (RN CM) Complete

## 2024-02-08 NOTE — Evaluation (Signed)
 Occupational Therapy Evaluation Patient Details Name: Jacqueline [name] MRN: [id number] DOB: Dec 09, 1930 Today's Date: 02/08/2024   History of Present Illness   89 year old admitted 7/13 with worsening weakness and chest pressure. Acute on chronic diastolic heart failure. Pertinent past medical history includes paroxysmal A-fib, pulmonary arterial hypertension, coronary artery disease, hypertension, hypothyroidism and chronic diastolic congestive heart failure.     Clinical Impressions PTA, pt lives alone, typically Modified Independent with ADLs, household IADLs and mobility using Rollator. Pt presents now with minor deficits in activity tolerance (somewhat limited by headache) and close to baseline. Pt able to manage mobility in room using RW and ADLs w/o LOB or safety concerns. Will follow distantly while admitted but anticipate no OT needs upon DC.  BP supine: 109/63 BP sitting: 93/79 BP standing: 109/72     If plan is discharge home, recommend the following:   Other (comment) (PRN)     Functional Status Assessment   Patient has had a recent decline in their functional status and demonstrates the ability to make significant improvements in function in a reasonable and predictable amount of time.     Equipment Recommendations   None recommended by OT     Recommendations for Other Services         Precautions/Restrictions   Precautions Precautions: Fall Recall of Precautions/Restrictions: Intact Restrictions Weight Bearing Restrictions Per Provider Order: No     Mobility Bed Mobility Overal bed mobility: Modified Independent                  Transfers Overall transfer level: Needs assistance Equipment used: Rolling walker (2 wheels) Transfers: Sit to/from Stand Sit to Stand: Supervision                  Balance Overall balance assessment: Needs assistance Sitting-balance support: Feet supported, No upper extremity supported Sitting  balance-Leahy Scale: Good     Standing balance support: Bilateral upper extremity supported, During functional activity, No upper extremity supported Standing balance-Leahy Scale: Fair                             ADL either performed or assessed with clinical judgement   ADL Overall ADL's : Needs assistance/impaired Eating/Feeding: Independent   Grooming: Supervision/safety;Standing;Wash/dry face Grooming Details (indicate cue type and reason): standing at sink Upper Body Bathing: Set up;Sitting   Lower Body Bathing: Supervison/ safety;Sitting/lateral leans;Sit to/from stand Lower Body Bathing Details (indicate cue type and reason): standing to bathe peri region, seated EOB to wash BLE Upper Body Dressing : Set up;Sitting   Lower Body Dressing: Supervision/safety;Sitting/lateral leans;Sit to/from stand   Toilet Transfer: Supervision/safety;Ambulation;Rolling walker (2 wheels)   Toileting- Clothing Manipulation and Hygiene: Supervision/safety;Sitting/lateral lean;Sit to/from stand       Functional mobility during ADLs: Supervision/safety;Rolling walker (2 wheels) General ADL Comments: Near baseline, limited somewhat by headache today.     Vision Baseline Vision/History: 1 Wears glasses Ability to See in Adequate Light: 1 Impaired Patient Visual Report: No change from baseline Vision Assessment?: No apparent visual deficits     Perception         Praxis         Pertinent Vitals/Pain Pain Assessment Pain Assessment: 0-10 Pain Score: 8  Pain Location: headache Pain Descriptors / Indicators: Headache Pain Intervention(s): Monitored during session, Patient requesting pain meds-RN notified, RN gave pain meds during session     Extremity/Trunk Assessment Upper Extremity Assessment Upper Extremity Assessment: Right hand dominant;RUE deficits/detail  RUE Deficits / Details: hx of rotator cuff tear in which surgery did not help. limited shoulder flexion    Lower Extremity Assessment Lower Extremity Assessment: Defer to PT evaluation   Cervical / Trunk Assessment Cervical / Trunk Assessment: Normal   Communication Communication Communication: No apparent difficulties   Cognition Arousal: Alert Behavior During Therapy: WFL for tasks assessed/performed Cognition: No apparent impairments                               Following commands: Intact       Cueing  General Comments   Cueing Techniques: Verbal cues      Exercises     Shoulder Instructions      Home Living Family/patient expects to be discharged to:: Private residence Living Arrangements: Alone Available Help at Discharge: Family;Available PRN/intermittently Type of Home: House Home Access: Stairs to enter [employer] of Steps: 4 Entrance Stairs-Rails: Left Home Layout: One level     Bathroom Shower/Tub: Producer, television/film/video: Handicapped height     Home Equipment: Rollator (4 wheels);Cane - single point;Hand held shower head;Grab bars - tub/shower          Prior Functioning/Environment Prior Level of Function : Independent/Modified Independent             Mobility Comments: Denies falls this year. Uses rollator indoors. ADLs Comments: Independent with ADLs, simple meals at home. has a housekeeper every other week.    OT Problem List: Decreased activity tolerance   OT Treatment/Interventions: Self-care/ADL training;Therapeutic exercise;Energy conservation;DME and/or AE instruction;Therapeutic activities;Patient/family education;Balance training      OT Goals(Current goals can be found in the care plan section)   Acute Rehab OT Goals Patient Stated Goal: for headache to go away OT Goal Formulation: With patient Time For Goal Achievement: 02/22/24 Potential to Achieve Goals: Good   OT Frequency:  Min 1X/week    Co-evaluation              AM-PAC OT 6 Clicks Daily Activity     Outcome Measure  Help from another person eating meals?: None Help from another person taking care of personal grooming?: A Little Help from another person toileting, which includes using toliet, bedpan, or urinal?: A Little Help from another person bathing (including washing, rinsing, drying)?: A Little Help from another person to put on and taking off regular upper body clothing?: A Little Help from another person to put on and taking off regular lower body clothing?: A Little 6 Click Score: 19   End of Session Equipment Utilized During Treatment: Rolling walker (2 wheels) Nurse Communication: Mobility status  Activity Tolerance: Patient tolerated treatment well Patient left: in bed;with call bell/phone within reach;with nursing/sitter in room  OT Visit Diagnosis: Muscle weakness (generalized) (M62.81)                Time: 9144-9083 OT Time Calculation (min): 21 min Charges:  OT General Charges $OT Visit: 1 Visit OT Evaluation $OT Eval Low Complexity: 1 Low  Mliss NOVAK, OTR/L Acute Rehab Services Office: [phone]   Mliss Fish 02/08/2024, 9:27 AM

## 2024-02-08 NOTE — Assessment & Plan Note (Signed)
 VSS, not in RVR. Unclear length of anticoagulation as patient has intermittently held at home for bleeding. May require cardioversion versus transition off of amiodarone  due to elevated LFTs. -Patient's HR elevating >110 with activity -Cardiology recommends she stay on current dose of amiodarone , HR reassuring -Continue home amiodarone  for now -Cardiac monitoring  -Continue Eliquis  2.5mg  daily

## 2024-02-08 NOTE — Progress Notes (Signed)
 Echocardiogram 2D Echocardiogram has been performed.  Jacqueline [name] 02/08/2024, 11:37 AM

## 2024-02-08 NOTE — Progress Notes (Signed)
  Progress Note  Patient Name: Jacqueline [name] Date of Encounter: 02/08/2024 [facility] Cardiologist: Jerel Balding, MD   Interval Summary   Feeling better  Vital Signs Vitals:   02/08/24 0000 02/08/24 0200 02/08/24 0500 02/08/24 0742  BP: 104/68 102/60 105/61 90/66  Pulse: 81 76 74 78  Resp: 20 20 20 20   Temp:   97.9 F (36.6 C) 97.8 F (36.6 C)  TempSrc:   Oral Oral  SpO2: 95% 92% 95% 94%  Weight:   39.2 kg   Height:        Intake/Output Summary (Last 24 hours) at 02/08/2024 0809 Last data filed at 02/08/2024 0524 Gross per 24 hour  Intake 240 ml  Output 900 ml  Net -660 ml      02/08/2024    5:00 AM 02/07/2024   12:22 PM 01/25/2024    9:43 AM  Last 3 Weights  Weight (lbs) 86 lb 6.4 oz 109 lb 12.6 oz 114 lb  Weight (kg) 39.191 kg 49.8 kg 51.71 kg      Telemetry/ECG  AFIB stable HR - Personally Reviewed  Physical Exam  GEN: No acute distress.   Neck: No JVD Cardiac: Irreg irreg, no murmurs, rubs, or gallops.  Respiratory: Clear to auscultation bilaterally. GI: Soft, nontender, non-distended  MS: No edema  Assessment & Plan   89 year old with persistent atrial fibrillation, chronic diastolic heart failure, transaminitis  Acute on chronic diastolic heart failure -1 dose of IV Lasix  40 mg was utilized.  Seems to be euvolemic.  No edema.  In the past medical management of diastolic heart failure has been limited by hypotension. Back on lasix  20 mg.  Persistent atrial fibrillation -Amiodarone  200 mg a day.  400 mg dosage caused bradycardia in the past.  Continue 200. -Most recent pulse 78, pulse very reasonable in the evening hours.  No changes necessary.  Transaminitis - ALT decreased from 145-118.  Pending labs today.  Okay to continue amiodarone  since downtrending.  Coronary disease - On amlodipine  as antianginal.  Rare discomfort.  Chronic kidney disease stage IIIa -Stable, 1.8   For questions or updates, please contact [facility] Please consult [email or web address] for contact info under       Signed, Oneil Parchment, MD

## 2024-02-08 NOTE — Discharge Instructions (Addendum)
 Dear Jacqueline [name],   Thank you so much for allowing us  to be part of your care!  You were admitted to [facility] for shortness of breath. Please continue your medications as prescribed and pay attention to any changes that were made.   We advise you to decrease your tylenol  intake as this can impact your liver enzymes (which were elevated during this admission and worked up).  DISCHARGE MEDICATION CHANGES Please see attached packet for details  POST-HOSPITAL & CARE INSTRUCTIONS Please let PCP/Specialists know of any changes that were made.  Please see medications section of this packet for any medication changes.   DOCTOR'S APPOINTMENT & FOLLOW UP CARE INSTRUCTIONS     RETURN PRECAUTIONS: Please return to care if you experience worsening shortness of breath, chest pain/pressure, chest palpitations/racing heartbeat  Take care and be well!  Family Medicine Teaching Service Inpatient Team [facility]  [address] [place], [place] [address] [phone]

## 2024-02-08 NOTE — Discharge Summary (Cosign Needed Addendum)
 Family Medicine Teaching [facility] Discharge Summary  Patient name: Jacqueline [name] Medical record number: [id number] Date of birth: 1930-12-16 Age: 89 y.o. Gender: female Date of Admission: 02/06/2024  Date of Discharge: 7/15 Admitting Physician: Leafy Scriver, DO  Primary Care Provider: Madelon Donald HERO, DO Consultants: Cardiology  Indication for Hospitalization: Progressive dyspnea and orthopnea  Discharge Diagnoses/Problem List:  Principal Problem for Admission: HF exacerbation Other Problems addressed during stay:  Principal Problem:   Dyspnea on exertion Active Problems:   Paroxysmal atrial fibrillation (HCC)   CKD stage G3b/A1, GFR 30-44 and albumin  creatinine ratio <30 mg/g (HCC)   Acute on chronic diastolic heart failure (HCC)   Transaminitis   Atrial fibrillation (HCC)   Chest tightness   Chronic health problem   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 female who was admitted to the [facility] Medicine Teaching Service at [facility] for CHF exacerbation. Hospital course is outlined below by problem.   CHF exacerbation Presented with dyspnea, weakness, orthopnea, chest tightness.  BNP 366.  She was started on IV Lasix  40mg  with appropriate diuresis. Transitioned to PO Lasix  20mg  and was euvolemic. Repeat echo showed LVEF 55-60% w/ moderate L atrial dilation.  Paroxysmal atrial fibrillation Initial EKG in ED suspicious for A-fib not in RVR.  Home amiodarone  200mg  was continued.  Home Eliquis  2.5 mg daily was paused for one week in June due to rectal bleeding, otherwise compliant. Cardiology consulted who attested that her current regimen was appropriate with reassuring heart rates.   Elevated LFTs Mildly elevated alk phos, AST, ALT that were downtrending at discharge. Negative RUQ US  and hepatitis panel. Cardiology advised to keep patient on amiodarone  despite this. Decreased outpatient tylenol  dosage to 650 BID from 1150.     ESBL E coli Patient had Ucx from ER that grew ESBL, denied all UTI-related symptoms therefore classified as asymptomatic bacteriuria and left untreated.   Debility Patient reports progressive weakness over the last few months. Outpatient PT recommended for maintaining strength and function. No outpatient OT recommended.   Other conditions that were chronic and stable: Hypertension, hypothyroidism, GERD, neuropathy  Issues for follow up: Assess volume status Consider repeating LFTs and discuss decreasing Tylenol  intake as able.  Results/Tests Pending at Time of Discharge:  Unresulted Labs (From admission, onward)    None      Disposition: Home w/ OPPT  Discharge Condition: Afib w/ VSS, euvolemic.   Discharge Exam:  Vitals:   02/08/24 0742 02/08/24 1158  BP: 90/66 126/74  Pulse: 78 95  Resp: 20 15  Temp: 97.8 F (36.6 C) (!) 97.5 F (36.4 C)  SpO2: 94% 95%   General: Awake, alert, no acute distress.  Communicating clearly. Cardiovascular: Irregular rhythm with no murmurs rubs or gallops appreciated.  2+ radial and dorsalis pedis pulses bilaterally with good capillary refill. Respiratory: Clear to auscultation bilaterally Abdomen: Soft nontender nondistended  Significant Procedures: None  Significant Labs and Imaging:  Recent Labs  Lab 02/07/24 0428  WBC 6.6  HGB 12.4  HCT 36.8  PLT 145*   Recent Labs  Lab 02/06/24 1634 02/07/24 0428 02/07/24 0458 02/08/24 0625  NA  --  137  --  137  K  --  4.1  --  3.6  CL  --  101  --  100  CO2  --  24  --  25  GLUCOSE  --  111*  --  96  BUN  --  23  --  28*  CREATININE  --  1.78*  --  1.76*  CALCIUM   --  8.8*  --  9.0  MG 2.0  --  2.2 2.1  ALKPHOS  --  160*  --  141*  AST  --  86*  --  72*  ALT  --  115*  --  88*  ALBUMIN   --  3.1*  --  2.9*  7/13 BNP 366 7/13 UA w/ moderate leuks neg nitrites and few bacteria, Ucx 100k ESBL E coli (ASB)   Imaging:   RUQ US  7/13: Unremarkable right upper quadrant  ultrasound.  CXR 7/13 My interpretation of radiologist impression: Cardiomegaly with no other acute cardiopulmonary process.   2D Echo 7/15: LVEF 55-60%. Moderate L atrial dilation.   Discharge Medications:  Allergies as of 02/08/2024       Reactions   Atorvastatin Other (See Comments)   Myalgias in legs   Beta Adrenergic Blockers Other (See Comments)   Severe bradycardia   Crestor [rosuvastatin] Other (See Comments)   Myalgias in legs   Morphine And Codeine Other (See Comments)   Crazy thoughts, severe headache, sick   Morphine Sulfate Other (See Comments)   Crazy thoughts, severe headache, sick   Prednisone  Other (See Comments)   On two occasions has caused A fib        Medication List     PAUSE taking these medications    hydrALAZINE  25 MG tablet Wait to take this until your doctor or other care provider tells you to start again. Commonly known as: APRESOLINE  Take 1 tablet (25 mg total) by mouth 3 (three) times daily as needed (may take up to three times daily for BP >160). What changed: when to take this       STOP taking these medications    potassium chloride  SA 20 MEQ tablet Commonly known as: KLOR-CON  M       TAKE these medications    acetaminophen  325 MG tablet Commonly known as: TYLENOL  Take 2 tablets (650 mg total) by mouth every 6 (six) hours as needed for headache, mild pain (pain score 1-3) or moderate pain (pain score 4-6). What changed:  medication strength how much to take when to take this reasons to take this Another medication with the same name was removed. Continue taking this medication, and follow the directions you see here.   ALPRAZolam  0.5 MG tablet Commonly known as: XANAX  TAKE 1 TABLET BY MOUTH AT BEDTIME AS NEEDED FOR ANXIETY What changed: See the new instructions.   amiodarone  200 MG tablet Commonly known as: PACERONE  TAKE 1 TABLET BY MOUTH EVERY DAY   amLODipine  5 MG tablet Commonly known as: NORVASC  TAKE 1  TABLET (5 MG TOTAL) BY MOUTH DAILY.   apixaban  2.5 MG Tabs tablet Commonly known as: ELIQUIS  Take 1 tablet (2.5 mg total) by mouth 2 (two) times daily.   cycloSPORINE  0.05 % ophthalmic emulsion Commonly known as: RESTASIS  Place 1 drop into both eyes 2 (two) times daily.   diclofenac  Sodium 1 % Gel Commonly known as: Voltaren  Apply 4 g topically 4 (four) times daily. What changed:  how much to take when to take this   Fish Oil 1000 MG Caps Take 1,000 mg by mouth every evening.   furosemide  20 MG tablet Commonly known as: LASIX  Take 1 tablet (20 mg total) by mouth daily.   gabapentin  100 MG capsule Commonly known as: NEURONTIN  TAKE 1 CAPSULE (100 MG TOTAL) BY MOUTH 3 TIMES  A DAY   levothyroxine  75 MCG tablet Commonly known as: SYNTHROID  Take 1 tablet (75 mcg total) by mouth daily.   Lubricant Eye Drops 0.4-0.3 % Soln Generic drug: Polyethyl Glycol-Propyl Glycol Place 1 drop into both eyes daily as needed (dry eye).   nitroGLYCERIN  0.4 MG SL tablet Commonly known as: NITROSTAT  Place 1 tablet (0.4 mg total) under the tongue every 5 (five) minutes as needed for chest pain.   omeprazole  20 MG capsule Commonly known as: PRILOSEC TAKE 1 CAPSULE BY MOUTH EVERY DAY   polyethylene glycol 17 g packet Commonly known as: MIRALAX  / GLYCOLAX  Take 17 g by mouth 2 (two) times daily. What changed:  when to take this reasons to take this   pravastatin  40 MG tablet Commonly known as: PRAVACHOL  Take 1 tablet (40 mg total) by mouth every evening.   rOPINIRole  1 MG tablet Commonly known as: REQUIP  Take 1 tablet (1 mg total) by mouth at bedtime.   rOPINIRole  0.25 MG tablet Commonly known as: REQUIP  Take 1 tablet by mouth in the morning and afternoon.        Discharge Instructions: Please refer to Patient Instructions section of EMR for full details.  Patient was counseled important signs and symptoms that should prompt return to medical care, changes in medications, dietary  instructions, activity restrictions, and follow up appointments.   Follow-Up Appointments:   Follow-up Information     [facility]. Go on 02/11/2024.   Why: at 10:50am Contact information: [address] [place]  [address] [phone]               Manon Jester, DO 02/08/2024, 1:03 PM PGY-1, [facility]   FMTS Upper-Level Resident Addendum   I have independently interviewed and examined the patient. I have discussed the above with Dr. Manon and agree with the documented plan. My edits for correction/addition/clarification are included above. Please see any attending notes.   Kathrine Melena, DO PGY-2, [facility] 02/08/2024 3:04 PM

## 2024-02-08 NOTE — Progress Notes (Signed)
 Daily Progress Note Intern Pager: [phone]  Patient name: Jacqueline [name] Medical record number: [id number] Date of birth: 19-Dec-1930 Age: 89 y.o. Gender: female  Primary Care Provider: Madelon Donald HERO, DO Consultants: Cardiology Code Status: Full  Pt Overview and Major Events to Date:  7/14-admitted  Medical Decision Making: Jacqueline [name] is a 89 y.o. female presenting with a month of worsening weakness and chest pressure. Differential for this patient's presentation of this includes CHF exacerbation (prior diastolic HF, slowly progressive orthopnea, dyspnea, and CP, BNP 366), Afib (chest pain in the setting of established Afib), ACS (chest pain, weakness, risk factors present, unlikely due to trops and EKG reassuring), and anemia (weakness, chest pain, poor po intake, however Hgb WNL 12.4).  Pertinent past medical history includes paroxysmal A-fib, pulmonary arterial hypertension, coronary artery disease, hypertension, hypothyroidism and chronic diastolic congestive heart failure. Assessment & Plan Acute on chronic diastolic heart failure (HCC) Chest tightness Pt w/ recently progressive dyspnea, weakness, orthopnea and chest pressure. Echo 2023 60-65%. BNP 366.  -Admit to FMTS, attending Mcdiarmid -Continue home amlodipine  given stable Bps -PO lasix  20mg   -Patient appears euvolemic, no LE edema - since admission -Repeat Echo 7/15 AM then D/C -Strict I/Os -Daily weights -AM CMP, Mag Atrial fibrillation (HCC) VSS, not in RVR. Unclear length of anticoagulation as patient has intermittently held at home for bleeding. May require cardioversion versus transition off of amiodarone  due to elevated LFTs. -Patient's HR elevating >110 with activity -Cardiology recommends she stay on current dose of amiodarone , HR reassuring -Continue home amiodarone  for now -Cardiac monitoring  -Continue Eliquis  2.5mg  daily Transaminitis Alk phos 164-> 160-> 141 AST 132->86-> 72 ALT  145->115 ->88.  Possible explanations include amiodarone  side effect, viral, high home tylenol  dosage, cholestasis or biliary causes. -RUQ US  negative -Hepatitis panel negative - AM CMP -Only supplements she takes are fish oil, geritol and cranberry for uti, unlikely culprit.  -Discharge on decreased BID tylenol  dose (650 from 1150) Chronic health problem Hypertension - Amlodipine  5 mg tablet daily - taking Hypothyroidism - Levothyroxine  75 mcg tablet daily GERD - Omeprazole  20 mg capsule daily Chronic diastolic CHF - Lasix  20 mg tablet daily Neuropathy - Gabapentin  100 mg capsule 3 times daily  FEN/GI: Heart healthy PPx: Home Eliquis  Dispo:Home today. Barriers include pending Echo.   Subjective:  No acute events overnight. She denies dysuria, frequency, abdominal or flank pain, fever or rigors.   Objective: Temp:  [97.9 F (36.6 C)-99.1 F (37.3 C)] 97.9 F (36.6 C) (07/15 0500) Pulse Rate:  [74-125] 74 (07/15 0500) Resp:  [18-24] 20 (07/15 0500) BP: (102-129)/(58-88) 105/61 (07/15 0500) SpO2:  [85 %-99 %] 95 % (07/15 0500) Weight:  [39.2 kg-49.8 kg] 39.2 kg (07/15 0500) Physical Exam: General: Awake, alert, no acute distress.  Communicating clearly. Cardiovascular: Irregular rhythm with no murmurs rubs or gallops appreciated.  2+ radial and dorsalis pedis pulses bilaterally with good capillary refill. Respiratory: To auscultation bilaterally Abdomen: Soft nontender nondistended  Laboratory: Most recent CBC Lab Results  Component Value Date   WBC 6.6 02/07/2024   HGB 12.4 02/07/2024   HCT 36.8 02/07/2024   MCV 95.8 02/07/2024   PLT 145 (L) 02/07/2024   Most recent BMP    Latest Ref Rng & Units 02/07/2024    4:28 AM  BMP  Glucose 70 - 99 mg/dL 888   BUN 8 - 23 mg/dL 23   Creatinine 9.55 - 1.00 mg/dL 8.21   Sodium 864 - 854 mmol/L 137  Potassium 3.5 - 5.1 mmol/L 4.1   Chloride 98 - 111 mmol/L 101   CO2 22 - 32 mmol/L 24   Calcium  8.9 - 10.3 mg/dL 8.8   Ucx  positive for Ecoli > 10,000 w/o symptoms of UTI -> ASB  Imaging/Diagnostic Tests: Echo: LVEF 55-60%. Moderate L atrial dilation.   Manon Jester, DO 02/08/2024, 7:29 AM  PGY-1, [facility] Family Medicine FPTS Intern pager: [phone], text pages welcome Secure chat group [facility] Teaching Service

## 2024-02-08 NOTE — Progress Notes (Signed)
 Heart Failure Navigator Progress Note  Assessed for Heart & Vascular TOC clinic readiness.  Patient does not meet criteria due to EF 55-60%, with progressive weakness. Has a scheduled Family Medicine appointment on 02/11/2024. No HF TOC. .   Navigator will sign off at this time.   Stephane Haddock, BSN, Scientist, clinical (histocompatibility and immunogenetics) Only

## 2024-02-08 NOTE — Assessment & Plan Note (Addendum)
 Alk phos 164-> 160-> 141 AST 132->86-> 72 ALT 145->115 ->88.  Possible explanations include amiodarone  side effect, viral, high home tylenol  dosage, cholestasis or biliary causes. -RUQ US  negative -Hepatitis panel negative - AM CMP -Only supplements she takes are fish oil, geritol and cranberry for uti, unlikely culprit.  -Discharge on decreased BID tylenol  dose (650 from 1150)

## 2024-02-08 NOTE — Assessment & Plan Note (Signed)
 Hypertension - Amlodipine  5 mg tablet daily - taking Hypothyroidism - Levothyroxine  75 mcg tablet daily GERD - Omeprazole  20 mg capsule daily Chronic diastolic CHF - Lasix  20 mg tablet daily Neuropathy - Gabapentin  100 mg capsule 3 times daily

## 2024-02-10 ENCOUNTER — Other Ambulatory Visit: Payer: Self-pay

## 2024-02-10 DIAGNOSIS — G629 Polyneuropathy, unspecified: Secondary | ICD-10-CM | POA: Diagnosis not present

## 2024-02-10 DIAGNOSIS — I5023 Acute on chronic systolic (congestive) heart failure: Secondary | ICD-10-CM | POA: Diagnosis not present

## 2024-02-10 NOTE — Patient Outreach (Signed)
 Complex Care Management   Visit Note  02/10/2024  Name:  Jacqueline [name] MRN: [id number] DOB: 10-Dec-1930  Situation: Referral received for Complex Care Management related to Meals on Wheels Referral I obtained verbal consent from Patient.  Visit completed with patient  on the phone  Background:   Past Medical History:  Diagnosis Date   Abnormal ECG 10/22/2021   Arthritis    back   Atrial fibrillation with rapid ventricular response (HCC) 02/24/2014   a. CHA2DS2VASc = 6 -> on eliquis ;  b. 02/2014 s/p DCCV;  c. 08/2014 Echo: EF 55-60%, Gr 2 DD, mild MR, triv AI.   Bronchitis 10/22/2021   CAD (coronary artery disease)    a. s/p CABG x 2 (LIMA->LAD, VG->Diag);  b. 08/2014 Cath: LM nl, LAD 90p, LCX nl, RCA nl/dominant, LIMA->LAD atretic, VG->D2 patent w/ retrograde filling of LAD, EF 55-60%-->Med Rx.   Cyclic citrullinated peptide (CCP) antibody positive 2016   Diverticulitis 02/21/2012   Elevated troponin 10/22/2021   GERD (gastroesophageal reflux disease)    Hyperlipemia    Hypertension    Insomnia    Vertigo 11/24/2018    Assessment: BSW held f/u call with patient. Patient was alert and cognitive. Patient reports she was in the hospital last Sunday but states doing better now. Patient states her son lives behind where she lives and checks up on her regularly. BSW confirmed with patient that he reached out to [employer] regarding [employer]. BSW informed patient there is currently a waitlist for this resource of over 900 people. Patient understood and expressed desired to be put on the wait list. BSW also placed a referral while on the phone to [employer] (scox@[employer] .[email or web address]) with [employer] for their home delivery program for food. Patient was instructed on how the program works and was provided contact information to the organization for referral follow up. Patient declined the need for additional BSW services and stated she did not  need additional information. BSW instructed patient how she can get connect with services again should her needs change in the future. Patient understood and agreed to close out case.   SDOH Interventions    Flowsheet Row Patient Outreach Telephone from 01/27/2024 in [place] POPULATION HEALTH DEPARTMENT Patient Outreach Telephone from 12/08/2023 in [place] POPULATION HEALTH DEPARTMENT Clinical Support from 10/28/2023 in [facility] - [facility] Dept Of [place]. [facility] Patient Outreach from 07/16/2023 in [place] POPULATION HEALTH DEPARTMENT Patient Outreach from 06/11/2023 in [place] POPULATION HEALTH DEPARTMENT Clinical Support from 11/12/2022 in [facility] - A Dept Of [place]. [facility]  SDOH Interventions        Food Insecurity Interventions Intervention Not Indicated Intervention Not Indicated Intervention Not Indicated -- -- Intervention Not Indicated  Housing Interventions Intervention Not Indicated Intervention Not Indicated Intervention Not Indicated -- -- Intervention Not Indicated  Transportation Interventions Intervention Not Indicated Intervention Not Indicated Intervention Not Indicated -- -- Intervention Not Indicated  Utilities Interventions Intervention Not Indicated Intervention Not Indicated Intervention Not Indicated -- -- Intervention Not Indicated  Alcohol  Usage Interventions -- -- Intervention Not Indicated (Score <7) -- -- Intervention Not Indicated (Score <7)  Financial Strain Interventions Intervention Not Indicated -- Intervention Not Indicated -- -- Intervention Not Indicated  Physical Activity Interventions -- -- Patient Declined, Intervention Not Indicated Patient Declined Patient Declined Patient Refused, Other (Comments)  Stress Interventions -- -- Intervention Not Indicated -- --  Intervention Not Indicated  Social Connections Interventions -- -- Intervention Not Indicated -- -- Intervention Not Indicated   Health Literacy Interventions -- -- Intervention Not Indicated -- Intervention Not Indicated --      Recommendation:   None. Patient goals have been met.   Follow Up Plan:   Patient has met all care management goals. Care Management case will be closed. Patient has been provided contact information should new needs arise.   Laymon Doll, BSW [place]/[facility] - [employer] Social Worker [phone]

## 2024-02-10 NOTE — Patient Instructions (Signed)

## 2024-02-11 ENCOUNTER — Telehealth

## 2024-02-11 ENCOUNTER — Inpatient Hospital Stay: Payer: Self-pay

## 2024-02-11 ENCOUNTER — Ambulatory Visit (INDEPENDENT_AMBULATORY_CARE_PROVIDER_SITE_OTHER): Admitting: Student

## 2024-02-11 ENCOUNTER — Encounter: Payer: Self-pay | Admitting: Student

## 2024-02-11 VITALS — BP 121/75 | HR 83 | Ht 61.0 in | Wt 114.0 lb

## 2024-02-11 DIAGNOSIS — R7401 Elevation of levels of liver transaminase levels: Secondary | ICD-10-CM | POA: Diagnosis not present

## 2024-02-11 DIAGNOSIS — I5033 Acute on chronic diastolic (congestive) heart failure: Secondary | ICD-10-CM

## 2024-02-11 DIAGNOSIS — M810 Age-related osteoporosis without current pathological fracture: Secondary | ICD-10-CM | POA: Diagnosis not present

## 2024-02-11 DIAGNOSIS — K5730 Diverticulosis of large intestine without perforation or abscess without bleeding: Secondary | ICD-10-CM | POA: Diagnosis not present

## 2024-02-11 DIAGNOSIS — I480 Paroxysmal atrial fibrillation: Secondary | ICD-10-CM | POA: Diagnosis not present

## 2024-02-11 NOTE — Patient Instructions (Addendum)
 It was great to see you! Thank you for allowing me to participate in your care!   I recommend that you always bring your medications to each appointment as this makes it easy to ensure we are on the correct medications and helps us  not miss when refills are needed.  Our plans for today:  - I will review your chart to determine if you have completed 5 years of osteoporosis treatment.  If you have, we will not pursue further treatment.  If you have not, we could consider treatment as most older adults will experience a benefit and there bone mineral density within 12-18 months. - We are checking some labs today, I will call you if they are abnormal will send you a MyChart message or a letter if they are normal.  If you do not hear about your labs in the next 2 weeks please let us  know.  Take care and seek immediate care sooner if you develop any concerns. Please remember to show up 15 minutes before your scheduled appointment time!  Gladis Church, DO [facility]

## 2024-02-11 NOTE — Patient Outreach (Signed)
 Patient was placed on waitlist for Me[employer] Wheels (MOW) with [employer] [place].  Contact name: Recardo Lowers [phone] ext. [phone]). Recardo will be calling patient to confirm with her that she is on the waitlist.

## 2024-02-11 NOTE — Assessment & Plan Note (Signed)
 No evidence of exacerbation today. - Continue furosemide  20 mg tablet daily - Follow-up with cardiology

## 2024-02-11 NOTE — Progress Notes (Signed)
    SUBJECTIVE:   CHIEF COMPLAINT / HPI:   Hospital follow-up: CHF exacerbation, A-fib RVR Patient presented with dyspnea and orthopnea to the hospital on 7/13 and was discharged on 7/14.  She was diuresed.  She was continued on her 200 mg amiodarone .  Since her hospitalization she has been doing well.  Of note, there was a discrepancy in her weight between her hospital weight and weight today.  There was a hospital weight of 86 and a current weight today of 114.  I strongly suspect that the weight of 86 pounds is not accurate, because the day before she was over 100 pounds-did not have that much diuresis to explain such as significant weight loss.  Osteoporosis Patient and daughter inquire about osteoporosis treatment.  Stated that I would look into her treatment course, and get back to them.  After visit, I searched in the chart: Started Fosamax  treatment in 02/2014, was discontinued on January 2017.  Did not receive full 5-year course.  She could have a benefit, and I will call her separately from this appointment when I receive her laboratory work to discuss further.  Elevated liver enzymes Elevated liver enzymes with a reduction in platelets.  Believed to may be related to her cardiovascular congestion and Tylenol  use.  Liver ultrasound and hepatic panel unremarkable.  OBJECTIVE:   BP 121/75   Pulse 83   Ht 5' 1 (1.549 m)   Wt 114 lb (51.7 kg)   SpO2 97%   BMI 21.54 kg/m    General: NAD, pleasant Cardio: RRR, no MRG.  Peripheral edema. Respiratory: CTAB, normal wob on RA GI: Abdomen is soft, not tender, not distended. BS present Skin: Warm and dry  ASSESSMENT/PLAN:   Assessment & Plan Acute on chronic diastolic heart failure (HCC) No evidence of exacerbation today. - Continue furosemide  20 mg tablet daily - Follow-up with cardiology Transaminitis Differential: Medication induced, hepatic congestion 2/2 CHF. - CMP today Osteoporosis without current pathological fracture,  unspecified osteoporosis type History of osteoporosis, history of fracture. -Consider completing course of bisphosphonates, we will discuss by phone when I contact her about her lab results.    Gladis Church, DO [facility]

## 2024-02-11 NOTE — Assessment & Plan Note (Signed)
 Differential: Medication induced, hepatic congestion 2/2 CHF. - CMP today

## 2024-02-11 NOTE — Assessment & Plan Note (Signed)
 History of osteoporosis, history of fracture. -Consider completing course of bisphosphonates, we will discuss by phone when I contact her about her lab results.

## 2024-02-12 LAB — COMPREHENSIVE METABOLIC PANEL WITH GFR
ALT: 100 IU/L — ABNORMAL HIGH (ref 0–32)
AST: 116 IU/L — ABNORMAL HIGH (ref 0–40)
Albumin: 4 g/dL (ref 3.6–4.6)
Alkaline Phosphatase: 204 IU/L — ABNORMAL HIGH (ref 44–121)
BUN/Creatinine Ratio: 25 (ref 12–28)
BUN: 46 mg/dL — ABNORMAL HIGH (ref 10–36)
Bilirubin Total: 0.3 mg/dL (ref 0.0–1.2)
CO2: 21 mmol/L (ref 20–29)
Calcium: 9 mg/dL (ref 8.7–10.3)
Chloride: 99 mmol/L (ref 96–106)
Creatinine, Ser: 1.84 mg/dL — ABNORMAL HIGH (ref 0.57–1.00)
Globulin, Total: 2.9 g/dL (ref 1.5–4.5)
Glucose: 83 mg/dL (ref 70–99)
Potassium: 5.2 mmol/L (ref 3.5–5.2)
Sodium: 136 mmol/L (ref 134–144)
Total Protein: 6.9 g/dL (ref 6.0–8.5)
eGFR: 26 mL/min/1.73 — ABNORMAL LOW (ref >59)

## 2024-02-13 DIAGNOSIS — I2510 Atherosclerotic heart disease of native coronary artery without angina pectoris: Secondary | ICD-10-CM | POA: Diagnosis not present

## 2024-02-13 DIAGNOSIS — Z951 Presence of aortocoronary bypass graft: Secondary | ICD-10-CM | POA: Diagnosis not present

## 2024-02-13 DIAGNOSIS — F4329 Adjustment disorder with other symptoms: Secondary | ICD-10-CM | POA: Diagnosis not present

## 2024-02-13 DIAGNOSIS — E039 Hypothyroidism, unspecified: Secondary | ICD-10-CM | POA: Diagnosis not present

## 2024-02-13 DIAGNOSIS — I2721 Secondary pulmonary arterial hypertension: Secondary | ICD-10-CM | POA: Diagnosis not present

## 2024-02-13 DIAGNOSIS — K219 Gastro-esophageal reflux disease without esophagitis: Secondary | ICD-10-CM | POA: Diagnosis not present

## 2024-02-13 DIAGNOSIS — G2581 Restless legs syndrome: Secondary | ICD-10-CM | POA: Diagnosis not present

## 2024-02-13 DIAGNOSIS — Z96651 Presence of right artificial knee joint: Secondary | ICD-10-CM | POA: Diagnosis not present

## 2024-02-13 DIAGNOSIS — N1832 Chronic kidney disease, stage 3b: Secondary | ICD-10-CM | POA: Diagnosis not present

## 2024-02-13 DIAGNOSIS — I480 Paroxysmal atrial fibrillation: Secondary | ICD-10-CM | POA: Diagnosis not present

## 2024-02-13 DIAGNOSIS — R7401 Elevation of levels of liver transaminase levels: Secondary | ICD-10-CM | POA: Diagnosis not present

## 2024-02-13 DIAGNOSIS — I4581 Long QT syndrome: Secondary | ICD-10-CM | POA: Diagnosis not present

## 2024-02-13 DIAGNOSIS — I130 Hypertensive heart and chronic kidney disease with heart failure and stage 1 through stage 4 chronic kidney disease, or unspecified chronic kidney disease: Secondary | ICD-10-CM | POA: Diagnosis not present

## 2024-02-13 DIAGNOSIS — G609 Hereditary and idiopathic neuropathy, unspecified: Secondary | ICD-10-CM | POA: Diagnosis not present

## 2024-02-13 DIAGNOSIS — M810 Age-related osteoporosis without current pathological fracture: Secondary | ICD-10-CM | POA: Diagnosis not present

## 2024-02-13 DIAGNOSIS — G4700 Insomnia, unspecified: Secondary | ICD-10-CM | POA: Diagnosis not present

## 2024-02-13 DIAGNOSIS — J309 Allergic rhinitis, unspecified: Secondary | ICD-10-CM | POA: Diagnosis not present

## 2024-02-13 DIAGNOSIS — I5033 Acute on chronic diastolic (congestive) heart failure: Secondary | ICD-10-CM | POA: Diagnosis not present

## 2024-02-13 DIAGNOSIS — R32 Unspecified urinary incontinence: Secondary | ICD-10-CM | POA: Diagnosis not present

## 2024-02-13 DIAGNOSIS — K5730 Diverticulosis of large intestine without perforation or abscess without bleeding: Secondary | ICD-10-CM | POA: Diagnosis not present

## 2024-02-13 DIAGNOSIS — E785 Hyperlipidemia, unspecified: Secondary | ICD-10-CM | POA: Diagnosis not present

## 2024-02-13 DIAGNOSIS — Z22358 Carrier of other enterobacterales: Secondary | ICD-10-CM | POA: Diagnosis not present

## 2024-02-13 DIAGNOSIS — I700 Atherosclerosis of aorta: Secondary | ICD-10-CM | POA: Diagnosis not present

## 2024-02-13 DIAGNOSIS — I495 Sick sinus syndrome: Secondary | ICD-10-CM | POA: Diagnosis not present

## 2024-02-14 ENCOUNTER — Ambulatory Visit: Payer: Self-pay | Admitting: Family Medicine

## 2024-02-15 ENCOUNTER — Telehealth: Payer: Self-pay

## 2024-02-15 ENCOUNTER — Other Ambulatory Visit: Payer: Self-pay

## 2024-02-15 ENCOUNTER — Telehealth: Payer: Self-pay | Admitting: Cardiovascular Disease

## 2024-02-15 LAB — CBC
Hematocrit: 38.2 % (ref 34.0–46.6)
Hemoglobin: 12.2 g/dL (ref 11.1–15.9)
MCH: 31.6 pg (ref 26.6–33.0)
MCHC: 31.9 g/dL (ref 31.5–35.7)
MCV: 99 fL — ABNORMAL HIGH (ref 79–97)
Platelets: 225 x10E3/uL (ref 150–450)
RBC: 3.86 x10E6/uL (ref 3.77–5.28)
RDW: 13.3 % (ref 11.7–15.4)
WBC: 6.4 x10E3/uL (ref 3.4–10.8)

## 2024-02-15 NOTE — Patient Instructions (Signed)
 Visit Information  Thank you for taking time to visit with me today. Please don't hesitate to contact me if I can be of assistance to you before our next scheduled appointment.  Telephone follow up appointment with care management team member scheduled for: 8/22//25 10 am with Rosaline Finlay [facility]   Please call the care guide team at [phone] if you need to cancel, schedule, or reschedule an appointment.   Please call the Suicide and Crisis Lifeline: 988 call the [place]  National Suicide Prevention Lifeline: 509-884-0641 or TTY: (704)161-2958 TTY 267 036 4138) to talk to a trained counselor call 1-800-273-TALK (toll free, 24 hour hotline) call 911 if you are experiencing a Mental Health or Behavioral Health Crisis or need someone to talk to.  Wilbert Diver RN, BSN, [facility]  [facility], [facility]    Care Coordinator Phone: [phone]

## 2024-02-15 NOTE — Telephone Encounter (Signed)
 Pt c/o medication issue:  1. Name of Medication: hydrALAZINE  (APRESOLINE ) 25 MG tablet (Expired)   2. How are you currently taking this medication (dosage and times per day)? N/A  3. Are you having a reaction (difficulty breathing--STAT)? no  4. What is your medication issue? Patient wants to know if she should restart this medication.

## 2024-02-15 NOTE — Telephone Encounter (Signed)
 [place] PT with [facility] calls nurse line requesting verbal orders for [facility] PT as follows.   2x a week for 2 weeks  1x a week for 4 weeks   Verbal order given per [facility] protocol.

## 2024-02-15 NOTE — Telephone Encounter (Signed)
 Pt called in returning [facility] number [phone]

## 2024-02-15 NOTE — Patient Outreach (Signed)
 Complex Care Management   Visit Note  02/15/2024  Name:  Jacqueline [name] MRN: [id number] DOB: July 10, 1931  Situation: Referral received for Complex Care Management related to Heart Failure, Atrial Fibrillation, and HTN I obtained verbal consent from Patient.  Visit completed with patient  on the phone  Background:   Past Medical History:  Diagnosis Date   Abnormal ECG 10/22/2021   Arthritis    back   Atrial fibrillation with rapid ventricular response (HCC) 02/24/2014   a. CHA2DS2VASc = 6 -> on eliquis ;  b. 02/2014 s/p DCCV;  c. 08/2014 Echo: EF 55-60%, Gr 2 DD, mild MR, triv AI.   Bronchitis 10/22/2021   CAD (coronary artery disease)    a. s/p CABG x 2 (LIMA->LAD, VG->Diag);  b. 08/2014 Cath: LM nl, LAD 90p, LCX nl, RCA nl/dominant, LIMA->LAD atretic, VG->D2 patent w/ retrograde filling of LAD, EF 55-60%-->Med Rx.   Cyclic citrullinated peptide (CCP) antibody positive 2016   Diverticulitis 02/21/2012   Elevated troponin 10/22/2021   GERD (gastroesophageal reflux disease)    Hyperlipemia    Hypertension    Insomnia    Vertigo 11/24/2018    Assessment:  I spoke with Jacqueline [name], who reported that she went to the ER on 02/06/24 due to chest pressure and palpitations. She was admitted and subsequently discharged on 02/08/24. Currently, she is using a walker due to general weakness. [facility] is providing care at home; nurses visited on Sunday, and the physical therapist will contact her to schedule a visit. Her Hydralazine  25 mg was paused until futher notice  and her Potassium Chloride  20 mEq has been discontinued, all her other medications remain unchanged. Patient Reported Symptoms:  Cognitive Cognitive Status: Able to follow simple commands, Alert and oriented to person, place, and time, Normal speech and language skills      Neurological Neurological Review of Symptoms: Headaches Neurological Comment: She has a dull headache for 2 weeks at top of head  HEENT HEENT  Symptoms Reported: No symptoms reported      Cardiovascular Cardiovascular Symptoms Reported: No symptoms reported Weight: 115 lb (52.2 kg)  Respiratory Respiratory Symptoms Reported: No symptoms reported    Endocrine Endocrine Symptoms Reported: No symptoms reported    Gastrointestinal Gastrointestinal Symptoms Reported: No symptoms reported      Genitourinary Genitourinary Symptoms Reported: No symptoms reported    Integumentary Integumentary Symptoms Reported: No symptoms reported    Musculoskeletal Musculoskelatal Symptoms Reviewed: Difficulty walking, Unsteady gait, Muscle pain, Joint pain, Weakness Musculoskeletal Management Strategies: Medical device, Medication therapy Falls in the past year?: No    Psychosocial       Quality of Family Relationships: supportive, involved Do you feel physically threatened by others?: No      02/15/2024   10:23 AM  Depression screen PHQ 2/9  Decreased Interest 0  Down, Depressed, Hopeless 0  PHQ - 2 Score 0    Vitals:   02/15/24 2244  BP: 105/72  Pulse: 90    Medications Reviewed Today     Reviewed by Weyman Corning, RN (Registered Nurse) on 02/15/24 at 1017  Med List Status: <None>   Medication Order Taking? Sig Documenting Provider Last Dose Status Informant  acetaminophen  (TYLENOL ) 325 MG tablet [id number] Yes Take 2 tablets (650 mg total) by mouth every 6 (six) hours as needed for headache, mild pain (pain score 1-3) or moderate pain (pain score 4-6). Gomes, Adriana, DO  Active   ALPRAZolam  (XANAX ) 0.5 MG tablet [id number] Yes TAKE 1 TABLET BY  MOUTH AT BEDTIME AS NEEDED FOR ANXIETY Madelon Donald HERO, DO  Active Child, Self, Pharmacy Records  amiodarone  (PACERONE ) 200 MG tablet [id number] Yes TAKE 1 TABLET BY MOUTH EVERY DAY Rumball, Donald HERO, DO  Active Child, Self, Pharmacy Records  amLODipine  (NORVASC ) 5 MG tablet [id number] Yes TAKE 1 TABLET (5 MG TOTAL) BY MOUTH DAILY. Croitoru, Mihai, MD  Active Child, Self, Pharmacy Records   apixaban  (ELIQUIS ) 2.5 MG TABS tablet [id number] Yes Take 1 tablet (2.5 mg total) by mouth 2 (two) times daily. Croitoru, Mihai, MD  Active Child, Self, Pharmacy Records  cycloSPORINE  (RESTASIS ) 0.05 % ophthalmic emulsion [id number] Yes Place 1 drop into both eyes 2 (two) times daily. [provider]  Active Child, Self, Pharmacy Records  diclofenac  Sodium (VOLTAREN ) 1 % GEL [id number] Yes Apply 4 g topically 4 (four) times daily.  Patient taking differently: Apply 1 Application topically in the morning, at noon, and at bedtime.   Howell Lunger, DO  Active Child, Self, Pharmacy Records  furosemide  (LASIX ) 20 MG tablet [id number] Yes Take 1 tablet (20 mg total) by mouth daily.  Patient taking differently: Take 20 mg by mouth daily. Patient states she takes that as needed   Croitoru, Mihai, MD  Active Child, Self, Pharmacy Records  gabapentin  (NEURONTIN ) 100 MG capsule [id number] Yes TAKE 1 CAPSULE (100 MG TOTAL) BY MOUTH 3 TIMES A DAY Rumball, Donald HERO, DO  Active Child, Self, Pharmacy Records  hydrALAZINE  (APRESOLINE ) 25 MG tablet [id number]  Take 1 tablet (25 mg total) by mouth 3 (three) times daily as needed (may take up to three times daily for BP >160).  Patient taking differently: Take 25 mg by mouth in the morning, at noon, and at bedtime.   Croitoru, Jerel, MD  Expired 02/06/24 2359 Child, Self, Pharmacy Records  levothyroxine  (SYNTHROID ) 75 MCG tablet [id number] Yes Take 1 tablet (75 mcg total) by mouth daily. Rumball, Alison M, DO  Active Child, Self, Pharmacy Records  nitroGLYCERIN  (NITROSTAT ) 0.4 MG SL tablet [id number] Yes Place 1 tablet (0.4 mg total) under the tongue every 5 (five) minutes as needed for chest pain. Croitoru, Mihai, MD  Active Child, Self, Pharmacy Records           Med Note (CRUTHIS, SHEFFIELD JAYSON Repress Feb 06, 2024  2:07 PM)    Omega-3 Fatty Acids (FISH OIL) 1000 MG CAPS [id number] Yes Take 1,000 mg by mouth every evening. [provider]  Active Child, Self,  Pharmacy Records           Med Note BERNELL MADELIN ONEIDA Stevan Aug 21, 2015  8:48 AM)    omeprazole  (PRILOSEC) 20 MG capsule [id number] Yes TAKE 1 CAPSULE BY MOUTH EVERY DAY Rumball, Donald HERO, DO  Active Child, Self, Pharmacy Records  Polyethyl Glycol-Propyl Glycol (LUBRICANT EYE DROPS) 0.4-0.3 % SOLN [id number] Yes Place 1 drop into both eyes daily as needed (dry eye). [provider]  Active Child, Self, Pharmacy Records           Med Note ALLEGRA, [facility] I   Sun May 26, 2020 12:02 AM)    polyethylene glycol (MIRALAX  / GLYCOLAX ) 17 g packet [id number] Yes Take 17 g by mouth 2 (two) times daily. Verdene Purchase, MD  Active Child, Self, Pharmacy Records           Med Note LORNE, SHEFFIELD JAYSON Repress Feb 06, 2024  2:07 PM)    pravastatin  (PRAVACHOL ) 40 MG tablet [id number] Yes Take 1 tablet (40  mg total) by mouth every evening. Croitoru, Mihai, MD  Active Child, Self, Pharmacy Records  rOPINIRole  (REQUIP ) 0.25 MG tablet [id number] Yes Take 1 tablet by mouth in the morning and afternoon. Rumball, Alison M, DO  Active Child, Self, Pharmacy Records  rOPINIRole  (REQUIP ) 1 MG tablet [id number] Yes Take 1 tablet (1 mg total) by mouth at bedtime. Rumball, Alison M, DO  Active Child, Self, Pharmacy Records            Recommendation:   Continue rehab with [facility]  Follow Up Plan:   Telephone follow up appointment with care management team member scheduled for:  8/22//25  10 am with Rosaline Finlay RNCM  Wilbert Diver RN, BSN, [facility]  [facility], [facility]    Care Coordinator Phone: [phone]

## 2024-02-15 NOTE — Telephone Encounter (Signed)
 Left message to call office

## 2024-02-15 NOTE — Telephone Encounter (Signed)
 Patient notified.  Medication resumed on patient's med list.  Patient aware hydralazine  is to be taken only as needed if BP>160

## 2024-02-15 NOTE — Telephone Encounter (Signed)
 Can use as needed, but it does not appear to be needed right now

## 2024-02-15 NOTE — Telephone Encounter (Signed)
 Patient was recently in the hospital and hydralazine  was paused.  She was instructed to check with Dr Francyne to see if she should resume.  Patient reports she has not had any recent elevated BP readings.  Today's BP was 105/72.  Will send to Dr Francyne to see if he wants patient to resume prn hydralazine .

## 2024-02-16 ENCOUNTER — Telehealth: Payer: Self-pay | Admitting: Student

## 2024-02-16 DIAGNOSIS — M810 Age-related osteoporosis without current pathological fracture: Secondary | ICD-10-CM

## 2024-02-16 MED ORDER — ALENDRONATE SODIUM 70 MG PO TABS: 70.0000 mg | ORAL_TABLET | ORAL | 11 refills | Status: AC

## 2024-02-16 NOTE — Telephone Encounter (Signed)
 Called patient, confirmed identity.  Transaminitis Discussed elevated liver enzymes.This is an acute elevation.  Patient is asymptomatic, no pain with stable vital signs at last visit. They were down-trending at discharge from hospital, but rose again. RUQ U/S and hepatitis panel negative.  Although ALP is elevated, no symptoms of stone. Still primarily suspect DILI possible from Amio and Tylenol . She has reduced tylenol  use to [phone] mg a day. Recommend discussion with cardiology for Amio management (dose reduction if reasonable). Plan to recheck liver enzymes in 3 months, if still elevated will send referral to hepatology. Consider repeat imaging, expanded viral testing, auto-immune labs at that time.  Osteoporosis Discussed prior inadequate treatment of her osteoporosis.  Given benefit of medication can be seen as early as 12 months, and her history of hip fracture, this would be reasonable to treat.  Discussed options.  Patient preferring daily oral medication.  She is currently taking vitamin D  supplementation OTC, we also discussed calcium  supplementation with [phone] mg elemental calcium  daily.  Will start alendronate  daily.  We will follow-up in approximately 3 months.

## 2024-02-20 ENCOUNTER — Other Ambulatory Visit: Payer: Self-pay | Admitting: Family Medicine

## 2024-02-25 ENCOUNTER — Other Ambulatory Visit: Payer: Self-pay | Admitting: Family Medicine

## 2024-02-25 DIAGNOSIS — F4329 Adjustment disorder with other symptoms: Secondary | ICD-10-CM

## 2024-02-26 ENCOUNTER — Other Ambulatory Visit: Payer: Self-pay | Admitting: Family Medicine

## 2024-02-26 ENCOUNTER — Other Ambulatory Visit: Payer: Self-pay | Admitting: Cardiovascular Disease

## 2024-02-26 DIAGNOSIS — K219 Gastro-esophageal reflux disease without esophagitis: Secondary | ICD-10-CM

## 2024-03-17 ENCOUNTER — Other Ambulatory Visit: Payer: Self-pay

## 2024-03-17 NOTE — Patient Instructions (Signed)
 Visit Information  Thank you for taking time to visit with me today. Please don't hesitate to contact me if I can be of assistance to you before our next scheduled appointment.  Your next care management appointment is by telephone on 04/14/24 at 10:30 AM  I will reach out to you on Monday  morning (03/20/24) to see how your weight has been over the weekend.  Please call the care guide team at [phone] if you need to cancel, schedule, or reschedule an appointment.   Please call the Suicide and Crisis Lifeline: 988 call 1-800-273-TALK (toll free, 24 hour hotline) if you are experiencing a Mental Health or Behavioral Health Crisis or need someone to talk to.  Rosaline Finlay, RN MSN [place]  VBCI Population Health RN Care Manager Direct Dial: [phone]  Fax: [phone]

## 2024-03-17 NOTE — Patient Outreach (Signed)
 Complex Care Management   Visit Note  03/17/2024  Name:  Jacqueline [name] MRN: [id number] DOB: [birth year]  Situation: Referral received for Complex Care Management related to Heart Failure I obtained verbal consent from Patient.  Visit completed with Patient  on the phone  Background:   Past Medical History:  Diagnosis Date   Abnormal ECG 10/22/2021   Arthritis    back   Atrial fibrillation with rapid ventricular response (HCC) 02/24/2014   a. CHA2DS2VASc = 6 -> on eliquis ;  b. 02/2014 s/p DCCV;  c. 08/2014 Echo: EF 55-60%, Gr 2 DD, mild MR, triv AI.   Bronchitis 10/22/2021   CAD (coronary artery disease)    a. s/p CABG x 2 (LIMA->LAD, VG->Diag);  b. 08/2014 Cath: LM nl, LAD 90p, LCX nl, RCA nl/dominant, LIMA->LAD atretic, VG->D2 patent w/ retrograde filling of LAD, EF 55-60%-->Med Rx.   Cyclic citrullinated peptide (CCP) antibody positive 2016   Diverticulitis 02/21/2012   Elevated troponin 10/22/2021   GERD (gastroesophageal reflux disease)    Hyperlipemia    Hypertension    Insomnia    Vertigo 11/24/2018    Assessment: Patient Reported Symptoms:  Cognitive Cognitive Status: Able to follow simple commands, Alert and oriented to person, place, and time, Normal speech and language skills Cognitive/Intellectual Conditions Management [RPT]: None reported or documented in medical history or problem list      Neurological Neurological Review of Symptoms: No symptoms reported Neurological Comment: occasional headaches continue, which patient attributes to allergies. Pain is primarily behind her eyes when present. Patient denies headache today.  HEENT HEENT Symptoms Reported: Not assessed      Cardiovascular Cardiovascular Symptoms Reported: No symptoms reported Does patient have uncontrolled Hypertension?: No Cardiovascular Management Strategies: Weight management, Medication therapy, Routine screening Do You Have a Working Readable Scale?: Yes Weight: 118 lb 9.6 oz (53.8 kg)  (Patient reported) Cardiovascular Comment: Patient reports her weight is up 3 lbs overnight. She has taken an extra dose of Lasix  this morning. Patient notes her next follow-up with cardiology is in December.  Respiratory Respiratory Symptoms Reported: No symptoms reported    Endocrine Endocrine Symptoms Reported: Not assessed    Gastrointestinal Gastrointestinal Symptoms Reported: No symptoms reported Additional Gastrointestinal Details: Patient reports a good appetite. She reports she will drink a boost on days that she is not very hungry. Last BM today.      Genitourinary Genitourinary Symptoms Reported: No symptoms reported    Integumentary Integumentary Symptoms Reported: No symptoms reported    Musculoskeletal Musculoskelatal Symptoms Reviewed: Unsteady gait, Back pain Musculoskeletal Management Strategies: Medical device, Adequate rest, Exercise Frieda) Musculoskeletal Comment: HH PT ([facility]) continue to come. Patient reports PT is going to be discharging her next week. Falls in the past year?: No Number of falls in past year: 1 or less Was there an injury with Fall?: No Fall Risk Category Calculator: 0 Patient Fall Risk Level: Low Fall Risk Patient at Risk for Falls Due to: Impaired balance/gait Fall risk Follow up: Falls evaluation completed, Education provided, Falls prevention discussed  Psychosocial Psychosocial Symptoms Reported: Not assessed          03/17/2024    PHQ2-9 Depression Screening   Little interest or pleasure in doing things    Feeling down, depressed, or hopeless    PHQ-2 - Total Score    Trouble falling or staying asleep, or sleeping too much    Feeling tired or having little energy    Poor appetite or overeating     Feeling  bad about yourself - or that you are a failure or have let yourself or your family down    Trouble concentrating on things, such as reading the newspaper or watching television    Moving or speaking so slowly that other  people could have noticed.  Or the opposite - being so fidgety or restless that you have been moving around a lot more than usual    Thoughts that you would be better off dead, or hurting yourself in some way    PHQ2-9 Total Score    If you checked off any problems, how difficult have these problems made it for you to do your work, take care of things at home, or get along with other people    Depression Interventions/Treatment      Vitals:   03/17/24 1011  BP: 116/80  Pulse: 75    Medications Reviewed Today     Reviewed by Arno Rosaline SQUIBB, RN (Registered Nurse) on 03/17/24 at 1013  Med List Status: <None>   Medication Order Taking? Sig Documenting Provider Last Dose Status Informant  acetaminophen  (TYLENOL ) 325 MG tablet [id number]  Take 2 tablets (650 mg total) by mouth every 6 (six) hours as needed for headache, mild pain (pain score 1-3) or moderate pain (pain score 4-6). Gomes, Adriana, DO  Active   alendronate  (FOSAMAX ) 70 MG tablet [id number]  Take 1 tablet (70 mg total) by mouth every 7 (seven) days. Take with a full glass of water on an empty stomach, you may take other medications, DO NOT eat for 30 minutes. Please sit upright for 30 minutes (DO NOT lie down) after taking the pill. Howell Lunger, DO  Active   ALPRAZolam  (XANAX ) 0.5 MG tablet [id number]  TAKE 1 TABLET BY MOUTH AT BEDTIME AS NEEDED FOR ANXIETY Rumball, Alison M, DO  Active   amiodarone  (PACERONE ) 200 MG tablet [id number]  TAKE 1 TABLET BY MOUTH EVERY DAY Rumball, Donald HERO, DO  Active Child, Self, Pharmacy Records  amLODipine  (NORVASC ) 5 MG tablet [id number]  TAKE 1 TABLET BY MOUTH EVERY DAY Croitoru, Mihai, MD  Active   apixaban  (ELIQUIS ) 2.5 MG TABS tablet [id number]  Take 1 tablet (2.5 mg total) by mouth 2 (two) times daily. Croitoru, Mihai, MD  Active Child, Self, Pharmacy Records  cycloSPORINE  (RESTASIS ) 0.05 % ophthalmic emulsion [id number]  Place 1 drop into both eyes 2 (two) times daily. [provider]  Active Child, Self, Pharmacy Records  diclofenac  Sodium (VOLTAREN ) 1 % GEL [id number]  Apply 4 g topically 4 (four) times daily.  Patient taking differently: Apply 1 Application topically in the morning, at noon, and at bedtime.   Howell Lunger, DO  Active Child, Self, Pharmacy Records  furosemide  (LASIX ) 20 MG tablet [id number]  Take 1 tablet (20 mg total) by mouth daily.  Patient taking differently: Take 20 mg by mouth daily. Patient states she takes that as needed   Croitoru, Mihai, MD  Active Child, Self, Pharmacy Records  gabapentin  (NEURONTIN ) 100 MG capsule [id number]  TAKE 1 CAPSULE (100 MG TOTAL) BY MOUTH 3 TIMES A DAY Rumball, Donald HERO, DO  Active Child, Self, Pharmacy Records  hydrALAZINE  (APRESOLINE ) 25 MG tablet [id number]  Take 1 tablet (25 mg total) by mouth 3 (three) times daily as needed (may take up to three times daily for BP >160).  Patient taking differently: Take 25 mg by mouth in the morning, at noon, and at bedtime.   Francyne Headland, MD  Expired 02/06/24 2359 Child, Self, Pharmacy  Records  levothyroxine  (SYNTHROID ) 75 MCG tablet [id number]  Take 1 tablet (75 mcg total) by mouth daily. Rumball, Alison M, DO  Active Child, Self, Pharmacy Records  nitroGLYCERIN  (NITROSTAT ) 0.4 MG SL tablet [id number]  Place 1 tablet (0.4 mg total) under the tongue every 5 (five) minutes as needed for chest pain. Croitoru, Mihai, MD  Active Child, Self, Pharmacy Records           Med Note (CRUTHIS, SHEFFIELD JAYSON Repress Feb 06, 2024  2:07 PM)    Omega-3 Fatty Acids (FISH OIL) 1000 MG CAPS [id number]  Take 1,000 mg by mouth every evening. [provider]  Active Child, Self, Pharmacy Records           Med Note BERNELL MADELIN ONEIDA Stevan Aug 21, 2015  8:48 AM)    omeprazole  (PRILOSEC) 20 MG capsule [id number]  TAKE 1 CAPSULE BY MOUTH EVERY DAY Rumball, Alison M, DO  Active   Polyethyl Glycol-Propyl Glycol (LUBRICANT EYE DROPS) 0.4-0.3 % SOLN [id number]  Place 1 drop into both eyes daily as needed (dry  eye). [provider]  Active Child, Self, Pharmacy Records           Med Note ALLEGRA, [facility] I   Sun May 26, 2020 12:02 AM)    polyethylene glycol (MIRALAX  / GLYCOLAX ) 17 g packet [id number]  Take 17 g by mouth 2 (two) times daily. Verdene Purchase, MD  Active Child, Self, Pharmacy Records           Med Note (CRUTHIS, SHEFFIELD JAYSON Repress Feb 06, 2024  2:07 PM)    pravastatin  (PRAVACHOL ) 40 MG tablet [id number]  Take 1 tablet (40 mg total) by mouth every evening. Croitoru, Mihai, MD  Active Child, Self, Pharmacy Records  rOPINIRole  (REQUIP ) 0.25 MG tablet [id number]  TAKE 1 TABLET BY MOUTH IN THE MORNING AND AFTERNOON. 1 HOUR BEFORE BEDTIME TAKE 2 TABLETS BY MOUTH. Rumball, Alison M, DO  Active   rOPINIRole  (REQUIP ) 1 MG tablet [id number]  TAKE 1 TABLET BY MOUTH AT BEDTIME. Rumball, Alison M, DO  Active             Recommendation:   Continue Current Plan of Care Continue to check your weight daily and monitor for symptoms of fluid retention  Follow Up Plan:   Telephone follow-up in 1 business day to ensure weight has gone down with extra dose of lasix  Telephone follow up appointment date/time:  04/14/24 at 10:30 AM  Rosaline Finlay, RN MSN [place]  [facility] RN Care Manager Direct Dial: [phone]  Fax: [phone]

## 2024-03-20 ENCOUNTER — Telehealth: Payer: Self-pay

## 2024-03-20 NOTE — Patient Outreach (Signed)
 Contacted patient to ensure weight has gone back to baseline following extra dose of Lasix  03/17/24. Patient reports weight has returned to 115.6 lb. BP 125/92, HR 83. She denies symptoms related to fluid retention. No other concerns at this time. CMRN will follow as scheduled 04/14/24.  Rosaline Finlay, RN MSN [place]  [facility] RN Care Manager Direct Dial: [phone]  Fax: [phone]

## 2024-04-14 ENCOUNTER — Other Ambulatory Visit: Payer: Self-pay

## 2024-04-14 NOTE — Patient Outreach (Signed)
 Complex Care Management   Visit Note  04/14/2024  Name:  Jacqueline [name] MRN: [id number] DOB: Jun 25, 1931  Situation: Referral received for Complex Care Management related to Heart Failure I obtained verbal consent from Patient.  Visit completed with Patient  on the phone  Background:   Past Medical History:  Diagnosis Date   Abnormal ECG 10/22/2021   Arthritis    back   Atrial fibrillation with rapid ventricular response (HCC) 02/24/2014   a. CHA2DS2VASc = 6 -> on eliquis ;  b. 02/2014 s/p DCCV;  c. 08/2014 Echo: EF 55-60%, Gr 2 DD, mild MR, triv AI.   Bronchitis 10/22/2021   CAD (coronary artery disease)    a. s/p CABG x 2 (LIMA->LAD, VG->Diag);  b. 08/2014 Cath: LM nl, LAD 90p, LCX nl, RCA nl/dominant, LIMA->LAD atretic, VG->D2 patent w/ retrograde filling of LAD, EF 55-60%-->Med Rx.   Cyclic citrullinated peptide (CCP) antibody positive 2016   Diverticulitis 02/21/2012   Elevated troponin 10/22/2021   GERD (gastroesophageal reflux disease)    Hyperlipemia    Hypertension    Insomnia    Vertigo 11/24/2018    Assessment: Patient Reported Symptoms:  Cognitive Cognitive Status: Able to follow simple commands, Alert and oriented to person, place, and time, Normal speech and language skills Cognitive/Intellectual Conditions Management [RPT]: None reported or documented in medical history or problem list      Neurological Neurological Review of Symptoms: No symptoms reported    HEENT HEENT Symptoms Reported: No symptoms reported      Cardiovascular Cardiovascular Symptoms Reported: No symptoms reported Does patient have uncontrolled Hypertension?: No Cardiovascular Management Strategies: Weight management, Medication therapy, Routine screening Do You Have a Working Readable Scale?: Yes Weight: 116 lb 12.8 oz (53 kg) (Patient reported) Cardiovascular Comment: Patient reports yesterday morning she noted her weight had gone up to 117.8 lbs and she took and extra dose of lasix .  Reports weight is back to baseline.  Respiratory Respiratory Symptoms Reported: No symptoms reported    Endocrine Endocrine Symptoms Reported: No symptoms reported    Gastrointestinal Gastrointestinal Symptoms Reported: No symptoms reported      Genitourinary Genitourinary Symptoms Reported: No symptoms reported    Integumentary Integumentary Symptoms Reported: No symptoms reported    Musculoskeletal Musculoskelatal Symptoms Reviewed: Unsteady gait, Back pain Musculoskeletal Management Strategies: Medical device, Adequate rest, Exercise Frieda) Musculoskeletal Comment: Patient denies falls since previous CMRN visit. She reports PT has signed off and she has continued to do exercises at home. Falls in the past year?: No Number of falls in past year: 1 or less Was there an injury with Fall?: No Fall Risk Category Calculator: 0 Patient Fall Risk Level: Low Fall Risk Patient at Risk for Falls Due to: Impaired balance/gait Fall risk Follow up: Falls evaluation completed, Education provided, Falls prevention discussed  Psychosocial Psychosocial Symptoms Reported: No symptoms reported          04/14/2024    PHQ2-9 Depression Screening   Little interest or pleasure in doing things    Feeling down, depressed, or hopeless    PHQ-2 - Total Score    Trouble falling or staying asleep, or sleeping too much    Feeling tired or having little energy    Poor appetite or overeating     Feeling bad about yourself - or that you are a failure or have let yourself or your family down    Trouble concentrating on things, such as reading the newspaper or watching television    Moving or speaking  so slowly that other people could have noticed.  Or the opposite - being so fidgety or restless that you have been moving around a lot more than usual    Thoughts that you would be better off dead, or hurting yourself in some way    PHQ2-9 Total Score    If you checked off any problems, how difficult have  these problems made it for you to do your work, take care of things at home, or get along with other people    Depression Interventions/Treatment      Vitals:   04/14/24 1013  BP: (!) 146/93  Pulse: 70    Medications Reviewed Today     Reviewed by Arno Rosaline SQUIBB, RN (Registered Nurse) on 04/14/24 at 1017  Med List Status: <None>   Medication Order Taking? Sig Documenting Provider Last Dose Status Informant  acetaminophen  (TYLENOL ) 325 MG tablet [id number]  Take 2 tablets (650 mg total) by mouth every 6 (six) hours as needed for headache, mild pain (pain score 1-3) or moderate pain (pain score 4-6). Janna Ferrier, DO  Active   alendronate  (FOSAMAX ) 70 MG tablet [id number]  Take 1 tablet (70 mg total) by mouth every 7 (seven) days. Take with a full glass of water on an empty stomach, you may take other medications, DO NOT eat for 30 minutes. Please sit upright for 30 minutes (DO NOT lie down) after taking the pill. Howell Lunger, DO  Active   ALPRAZolam  (XANAX ) 0.5 MG tablet [id number]  TAKE 1 TABLET BY MOUTH AT BEDTIME AS NEEDED FOR ANXIETY Rumball, Alison M, DO  Active   amiodarone  (PACERONE ) 200 MG tablet [id number]  TAKE 1 TABLET BY MOUTH EVERY DAY Rumball, Alison M, DO  Active Child, Self, Pharmacy Records  amLODipine  (NORVASC ) 5 MG tablet [id number]  TAKE 1 TABLET BY MOUTH EVERY DAY Croitoru, Mihai, MD  Active   apixaban  (ELIQUIS ) 2.5 MG TABS tablet [id number]  Take 1 tablet (2.5 mg total) by mouth 2 (two) times daily. Croitoru, Mihai, MD  Active Child, Self, Pharmacy Records  cycloSPORINE  (RESTASIS ) 0.05 % ophthalmic emulsion [id number]  Place 1 drop into both eyes 2 (two) times daily. [provider]  Active Child, Self, Pharmacy Records  diclofenac  Sodium (VOLTAREN ) 1 % GEL [id number]  Apply 4 g topically 4 (four) times daily.  Patient taking differently: Apply 1 Application topically in the morning, at noon, and at bedtime.   Howell Lunger, DO  Active Child, Self, Pharmacy  Records  furosemide  (LASIX ) 20 MG tablet [id number]  Take 1 tablet (20 mg total) by mouth daily. Croitoru, Mihai, MD  Active Child, Self, Pharmacy Records  gabapentin  (NEURONTIN ) 100 MG capsule [id number]  TAKE 1 CAPSULE (100 MG TOTAL) BY MOUTH 3 TIMES A DAY Rumball, Donald HERO, DO  Active Child, Self, Pharmacy Records  hydrALAZINE  (APRESOLINE ) 25 MG tablet [id number]  Take 1 tablet (25 mg total) by mouth 3 (three) times daily as needed (may take up to three times daily for BP >160).  Patient not taking: Reported on 03/17/2024   Francyne Headland, MD  Expired 02/06/24 2359 Child, Self, Pharmacy Records  levothyroxine  (SYNTHROID ) 75 MCG tablet [id number]  Take 1 tablet (75 mcg total) by mouth daily. Rumball, Alison M, DO  Active Child, Self, Pharmacy Records  nitroGLYCERIN  (NITROSTAT ) 0.4 MG SL tablet [id number]  Place 1 tablet (0.4 mg total) under the tongue every 5 (five) minutes as needed for chest pain. Croitoru, Mihai, MD  Active Child, Self, Pharmacy Records  Med Note (CRUTHIS, CHLOE C   Sun Feb 06, 2024  2:07 PM)    Omega-3 Fatty Acids (FISH OIL) 1000 MG CAPS [id number]  Take 1,000 mg by mouth every evening. [provider]  Active Child, Self, Pharmacy Records           Med Note BERNELL MADELIN ONEIDA Stevan Aug 21, 2015  8:48 AM)    omeprazole  (PRILOSEC) 20 MG capsule [id number]  TAKE 1 CAPSULE BY MOUTH EVERY DAY Rumball, Alison M, DO  Active   Polyethyl Glycol-Propyl Glycol (LUBRICANT EYE DROPS) 0.4-0.3 % SOLN [id number]  Place 1 drop into both eyes daily as needed (dry eye). [provider]  Active Child, Self, Pharmacy Records           Med Note ALLEGRA, [facility] I   Sun May 26, 2020 12:02 AM)    polyethylene glycol (MIRALAX  / GLYCOLAX ) 17 g packet [id number]  Take 17 g by mouth 2 (two) times daily. Verdene Purchase, MD  Active Child, Self, Pharmacy Records           Med Note (CRUTHIS, SHEFFIELD JAYSON Repress Feb 06, 2024  2:07 PM)    pravastatin  (PRAVACHOL ) 40 MG tablet [id number]  Take 1  tablet (40 mg total) by mouth every evening. Croitoru, Mihai, MD  Active Child, Self, Pharmacy Records  rOPINIRole  (REQUIP ) 0.25 MG tablet [id number]  TAKE 1 TABLET BY MOUTH IN THE MORNING AND AFTERNOON. 1 HOUR BEFORE BEDTIME TAKE 2 TABLETS BY MOUTH. Rumball, Alison M, DO  Active   rOPINIRole  (REQUIP ) 1 MG tablet [id number]  TAKE 1 TABLET BY MOUTH AT BEDTIME. Rumball, Alison M, DO  Active             Recommendation:   PCP Follow-up Continue Current Plan of Care  Follow Up Plan:   Closing From:  Complex Care Management Patient has met all care management goals. Care Management case will be closed. Patient has been provided contact information should new needs arise.   Rosaline Finlay, RN MSN [place]  [facility] RN Care Manager Direct Dial: [phone]  Fax: [phone]

## 2024-04-14 NOTE — Patient Instructions (Signed)
 Visit Information  Thank you for taking time to visit with me today. Please don't hesitate to contact me if I can be of assistance to you before our next scheduled appointment.  Your next care management appointment is no further scheduled appointments.    Closing From: Complex Care Management. Patient has met all care management goals. Care Management case will be closed. Patient has been provided contact information should new needs arise.   Please call the care guide team at [phone] if you need to cancel, schedule, or reschedule an appointment.   Please call the Suicide and Crisis Lifeline: 988 call 1-800-273-TALK (toll free, 24 hour hotline) if you are experiencing a Mental Health or Behavioral Health Crisis or need someone to talk to.  Rosaline Finlay, RN MSN [place]  [facility] RN Care Manager Direct Dial: [phone]  Fax: [phone]

## 2024-04-22 ENCOUNTER — Other Ambulatory Visit: Payer: Self-pay | Admitting: Family Medicine

## 2024-04-28 ENCOUNTER — Emergency Department (HOSPITAL_COMMUNITY)

## 2024-04-28 ENCOUNTER — Ambulatory Visit (INDEPENDENT_AMBULATORY_CARE_PROVIDER_SITE_OTHER): Admitting: Family Medicine

## 2024-04-28 ENCOUNTER — Emergency Department (HOSPITAL_COMMUNITY)
Admission: EM | Admit: 2024-04-28 | Discharge: 2024-04-28 | Disposition: A | Discharge status: Home / Self Care | Attending: Emergency Medicine | Admitting: Emergency Medicine

## 2024-04-28 ENCOUNTER — Encounter: Payer: Self-pay | Admitting: Family Medicine

## 2024-04-28 VITALS — BP 121/87 | HR 72 | Ht 61.0 in | Wt 121.1 lb

## 2024-04-28 DIAGNOSIS — W19XXXA Unspecified fall, initial encounter: Secondary | ICD-10-CM

## 2024-04-28 DIAGNOSIS — S0003XA Contusion of scalp, initial encounter: Secondary | ICD-10-CM | POA: Insufficient documentation

## 2024-04-28 DIAGNOSIS — I4891 Unspecified atrial fibrillation: Secondary | ICD-10-CM | POA: Insufficient documentation

## 2024-04-28 DIAGNOSIS — I509 Heart failure, unspecified: Secondary | ICD-10-CM | POA: Insufficient documentation

## 2024-04-28 DIAGNOSIS — S40022A Contusion of left upper arm, initial encounter: Secondary | ICD-10-CM | POA: Insufficient documentation

## 2024-04-28 DIAGNOSIS — Z0000 Encounter for general adult medical examination without abnormal findings: Secondary | ICD-10-CM

## 2024-04-28 DIAGNOSIS — R42 Dizziness and giddiness: Secondary | ICD-10-CM | POA: Diagnosis not present

## 2024-04-28 DIAGNOSIS — M4312 Spondylolisthesis, cervical region: Secondary | ICD-10-CM | POA: Diagnosis not present

## 2024-04-28 DIAGNOSIS — S8012XA Contusion of left lower leg, initial encounter: Secondary | ICD-10-CM | POA: Insufficient documentation

## 2024-04-28 DIAGNOSIS — M4802 Spinal stenosis, cervical region: Secondary | ICD-10-CM | POA: Diagnosis not present

## 2024-04-28 DIAGNOSIS — S199XXA Unspecified injury of neck, initial encounter: Secondary | ICD-10-CM | POA: Diagnosis not present

## 2024-04-28 DIAGNOSIS — Z23 Encounter for immunization: Secondary | ICD-10-CM

## 2024-04-28 DIAGNOSIS — Z043 Encounter for examination and observation following other accident: Secondary | ICD-10-CM | POA: Diagnosis not present

## 2024-04-28 DIAGNOSIS — W01198A Fall on same level from slipping, tripping and stumbling with subsequent striking against other object, initial encounter: Secondary | ICD-10-CM | POA: Diagnosis not present

## 2024-04-28 DIAGNOSIS — M47812 Spondylosis without myelopathy or radiculopathy, cervical region: Secondary | ICD-10-CM | POA: Diagnosis not present

## 2024-04-28 DIAGNOSIS — Z7901 Long term (current) use of anticoagulants: Secondary | ICD-10-CM | POA: Insufficient documentation

## 2024-04-28 DIAGNOSIS — M19012 Primary osteoarthritis, left shoulder: Secondary | ICD-10-CM | POA: Diagnosis not present

## 2024-04-28 DIAGNOSIS — I6782 Cerebral ischemia: Secondary | ICD-10-CM | POA: Diagnosis not present

## 2024-04-28 LAB — CBC WITH DIFFERENTIAL/PLATELET
Abs Immature Granulocytes: 0.01 K/uL (ref 0.00–0.07)
Basophils Absolute: 0 K/uL (ref 0.0–0.1)
Basophils Relative: 1 %
Eosinophils Absolute: 0.1 K/uL (ref 0.0–0.5)
Eosinophils Relative: 3 %
HCT: 37.8 % (ref 36.0–46.0)
Hemoglobin: 12.4 g/dL (ref 12.0–15.0)
Immature Granulocytes: 0 %
Lymphocytes Relative: 25 %
Lymphs Abs: 1.3 K/uL (ref 0.7–4.0)
MCH: 32.8 pg (ref 26.0–34.0)
MCHC: 32.8 g/dL (ref 30.0–36.0)
MCV: 100 fL (ref 80.0–100.0)
Monocytes Absolute: 0.4 K/uL (ref 0.1–1.0)
Monocytes Relative: 8 %
Neutro Abs: 3.1 K/uL (ref 1.7–7.7)
Neutrophils Relative %: 63 %
Platelets: 187 K/uL (ref 150–400)
RBC: 3.78 MIL/uL — ABNORMAL LOW (ref 3.87–5.11)
RDW: 14 % (ref 11.5–15.5)
WBC: 5 K/uL (ref 4.0–10.5)
nRBC: 0 % (ref 0.0–0.2)

## 2024-04-28 LAB — PROTIME-INR
INR: 1.1 (ref 0.8–1.2)
Prothrombin Time: 15 s (ref 11.4–15.2)

## 2024-04-28 LAB — COMPREHENSIVE METABOLIC PANEL WITH GFR
ALT: 23 U/L (ref 0–44)
AST: 37 U/L (ref 15–41)
Albumin: 3.8 g/dL (ref 3.5–5.0)
Alkaline Phosphatase: 120 U/L (ref 38–126)
Anion gap: 12 (ref 5–15)
BUN: 23 mg/dL (ref 8–23)
CO2: 23 mmol/L (ref 22–32)
Calcium: 8.6 mg/dL — ABNORMAL LOW (ref 8.9–10.3)
Chloride: 102 mmol/L (ref 98–111)
Creatinine, Ser: 1.65 mg/dL — ABNORMAL HIGH (ref 0.44–1.00)
GFR, Estimated: 29 mL/min — ABNORMAL LOW (ref >60)
Glucose, Bld: 95 mg/dL (ref 70–99)
Potassium: 3.8 mmol/L (ref 3.5–5.1)
Sodium: 137 mmol/L (ref 135–145)
Total Bilirubin: 0.5 mg/dL (ref 0.0–1.2)
Total Protein: 7 g/dL (ref 6.5–8.1)

## 2024-04-28 MED ORDER — ACETAMINOPHEN 325 MG PO TABS
650.0000 mg | ORAL_TABLET | Freq: Once | ORAL | Status: AC
  Administered 2024-04-28: 650 mg via ORAL
  Filled 2024-04-28: qty 2

## 2024-04-28 MED ORDER — MECLIZINE HCL 12.5 MG PO TABS: 12.5000 mg | ORAL_TABLET | Freq: Two times a day (BID) | ORAL | 0 refills | Status: AC | PRN

## 2024-04-28 MED ORDER — MECLIZINE HCL 25 MG PO TABS
12.5000 mg | ORAL_TABLET | Freq: Once | ORAL | Status: AC
  Administered 2024-04-28: 12.5 mg via ORAL
  Filled 2024-04-28: qty 1

## 2024-04-28 NOTE — ED Notes (Signed)
 Patient in Radiology for CT.

## 2024-04-28 NOTE — ED Provider Notes (Signed)
 [place] EMERGENCY DEPARTMENT AT [facility] Provider Note   CSN: [id number] Arrival date & time: 04/28/24  1603     Patient presents with: Jacqueline [name] is a 89 y.o. female history of heart failure, A-fib on Eliquis , here presenting with fall.  Patient states that last night she felt dizzy and fell and hit her head.  Patient also states that she hit her left shoulder as well as left lower leg.  Patient went to family practice for follow-up today for her elevated liver enzyme test.  She told her that she did hit her head and she was sent in for further evaluation.  Patient states that she has mild headache.  She denies any vomiting.  Denies any focal weakness.  Patient states that her tetanus is up-to-date.   The history is provided by the patient.       Prior to Admission medications   Medication Sig Start Date End Date Taking? Authorizing Provider  acetaminophen  (TYLENOL ) 325 MG tablet Take 2 tablets (650 mg total) by mouth every 6 (six) hours as needed for headache, mild pain (pain score 1-3) or moderate pain (pain score 4-6). 02/08/24   Gomes, Adriana, DO  alendronate  (FOSAMAX ) 70 MG tablet Take 1 tablet (70 mg total) by mouth every 7 (seven) days. Take with a full glass of water on an empty stomach, you may take other medications, DO NOT eat for 30 minutes. Please sit upright for 30 minutes (DO NOT lie down) after taking the pill. 02/16/24   Howell Lunger, DO  ALPRAZolam  (XANAX ) 0.5 MG tablet TAKE 1 TABLET BY MOUTH AT BEDTIME AS NEEDED FOR ANXIETY 02/28/24   Madelon Donald HERO, DO  amiodarone  (PACERONE ) 200 MG tablet TAKE 1 TABLET BY MOUTH EVERY DAY 01/26/24   Rumball, Alison M, DO  amLODipine  (NORVASC ) 5 MG tablet TAKE 1 TABLET BY MOUTH EVERY DAY 02/28/24   Croitoru, Mihai, MD  apixaban  (ELIQUIS ) 2.5 MG TABS tablet Take 1 tablet (2.5 mg total) by mouth 2 (two) times daily. 01/11/23   Croitoru, Mihai, MD  cycloSPORINE  (RESTASIS ) 0.05 % ophthalmic emulsion Place 1 drop  into both eyes 2 (two) times daily.    [provider]  diclofenac  Sodium (VOLTAREN ) 1 % GEL Apply 4 g topically 4 (four) times daily. Patient not taking: Reported on 04/28/2024 01/25/24   Howell Lunger, DO  furosemide  (LASIX ) 20 MG tablet Take 1 tablet (20 mg total) by mouth daily. 09/01/23   Croitoru, Mihai, MD  gabapentin  (NEURONTIN ) 100 MG capsule TAKE 1 CAPSULE (100 MG TOTAL) BY MOUTH 3 TIMES A DAY 06/28/23   Rumball, Alison M, DO  hydrALAZINE  (APRESOLINE ) 25 MG tablet Take 1 tablet (25 mg total) by mouth 3 (three) times daily as needed (may take up to three times daily for BP >160). Patient not taking: Reported on 04/28/2024 06/01/23 02/06/24  Croitoru, Mihai, MD  levothyroxine  (SYNTHROID ) 75 MCG tablet Take 1 tablet (75 mcg total) by mouth daily. 06/21/23   Rumball, Alison M, DO  nitroGLYCERIN  (NITROSTAT ) 0.4 MG SL tablet Place 1 tablet (0.4 mg total) under the tongue every 5 (five) minutes as needed for chest pain. 12/03/21   Croitoru, Mihai, MD  Omega-3 Fatty Acids (FISH OIL) 1000 MG CAPS Take 1,000 mg by mouth every evening.    [provider]  omeprazole  (PRILOSEC) 20 MG capsule TAKE 1 CAPSULE BY MOUTH EVERY DAY 02/28/24   Rumball, Alison M, DO  Polyethyl Glycol-Propyl Glycol (LUBRICANT EYE DROPS) 0.4-0.3 %  SOLN Place 1 drop into both eyes daily as needed (dry eye).    [provider]  polyethylene glycol (MIRALAX  / GLYCOLAX ) 17 g packet Take 17 g by mouth 2 (two) times daily. 02/13/22   Krishnan, Gokul, MD  pravastatin  (PRAVACHOL ) 40 MG tablet Take 1 tablet (40 mg total) by mouth every evening. 10/11/23   Croitoru, Mihai, MD  rOPINIRole  (REQUIP ) 0.25 MG tablet TAKE 1 TABLET BY MOUTH IN THE MORNING AND AFTERNOON. 1 HOUR BEFORE BEDTIME TAKE 2 TABLETS BY MOUTH. 04/26/24   Donah Laymon PARAS, MD  rOPINIRole  (REQUIP ) 1 MG tablet TAKE 1 TABLET BY MOUTH AT BEDTIME. 02/28/24   Madelon Donald HERO, DO    Allergies: Atorvastatin, Beta adrenergic blockers, Crestor [rosuvastatin],  Morphine and codeine, Morphine sulfate, and Prednisone     Review of Systems  Neurological:  Positive for dizziness.  All other systems reviewed and are negative.   Updated Vital Signs BP (!) 144/78   Pulse 68   Temp 98.7 F (37.1 C)   Resp 18   Ht 5' 1 (1.549 m)   Wt 54.9 kg   SpO2 100%   BMI 22.86 kg/m   Physical Exam Vitals and nursing note reviewed.  Constitutional:      Appearance: Normal appearance.  HENT:     Head: Normocephalic.     Comments: Patient has small left posterior scalp hematoma    Nose: Nose normal.     Mouth/Throat:     Mouth: Mucous membranes are moist.  Eyes:     Extraocular Movements: Extraocular movements intact.     Pupils: Pupils are equal, round, and reactive to light.  Cardiovascular:     Rate and Rhythm: Normal rate and regular rhythm.     Pulses: Normal pulses.     Heart sounds: Normal heart sounds.  Pulmonary:     Effort: Pulmonary effort is normal.     Breath sounds: Normal breath sounds.  Abdominal:     General: Abdomen is flat.     Palpations: Abdomen is soft.  Musculoskeletal:     Cervical back: Normal range of motion and neck supple.     Comments: Patient has bruising in the left proximal humerus area.  Normal range of motion of the left shoulder.  No obvious left elbow tenderness or bruising.  Patient does have skin tear in the left shin area.  No obvious bony tenderness.  Normal range of motion bilateral hips.  No spinal tenderness.  Skin:    General: Skin is warm.  Neurological:     General: No focal deficit present.     Mental Status: She is alert and oriented to person, place, and time.     Cranial Nerves: No cranial nerve deficit.     Sensory: No sensory deficit.  Psychiatric:        Mood and Affect: Mood normal.        Behavior: Behavior normal.     (all labs ordered are listed, but only abnormal results are displayed) Labs Reviewed  CBC WITH DIFFERENTIAL/PLATELET  COMPREHENSIVE METABOLIC PANEL WITH GFR   PROTIME-INR    EKG: None  Radiology: No results found.   Procedures   Medications Ordered in the ED  acetaminophen  (TYLENOL ) tablet 650 mg (650 mg Oral Given 04/28/24 1623)  meclizine  (ANTIVERT ) tablet 12.5 mg (12.5 mg Oral Given 04/28/24 1623)  Medical Decision Making Jacqueline [name] is a 89 y.o. female here presenting with fall.  Patient had some dizziness and fell and hit her head.  She also has bruising of the left proximal humerus as well as left tib-fib area.  Will get CT head and cervical spine to rule out fracture or bleeding.  Will get labs and also extremity x-rays.  Will give Tylenol  for pain  6:21 PM I reviewed patient's labs and they were unremarkable.  X-rays unremarkable and CT head and cervical spine unremarkable.  Patient was not orthostatic and felt much better when she stood up.  Will discharge home with meclizine  as needed.   Problems Addressed: Fall, initial encounter: acute illness or injury Scalp hematoma, initial encounter: acute illness or injury  Amount and/or Complexity of Data Reviewed Labs: ordered. Decision-making details documented in ED Course. Radiology: ordered and independent interpretation performed. Decision-making details documented in ED Course.  Risk OTC drugs.     Final diagnoses:  None    ED Discharge Orders     None          Patt Alm Macho, MD 04/28/24 [facility]

## 2024-04-28 NOTE — ED Notes (Signed)
 Patient discharged to home with family. Patient given written and oral discharge instructions. Patient verbalized understanding. Patient taken to private care via wheelchair by ER tech. Patient breathing without difficulty. Patient has no bleeding. Patient denies questions, concerns or needs at this time.

## 2024-04-28 NOTE — Progress Notes (Signed)
 Orthopedic Tech Progress Note Patient Details:  Jacqueline [name] 02/09/1931 [id number]  Patient ID: Jacqueline [name], female   DOB: [birth year], 89 y.o.   MRN: [id number] Level 2 trauma. Jacqueline [name] 04/28/2024, 4:36 PM

## 2024-04-28 NOTE — Discharge Instructions (Signed)
 As we discussed, your CT scan and x-rays were normal today  Please take meclizine  12.5 mg twice daily as needed for dizziness  Please continue your other current meds  See your doctor for follow-up  Return to ER if you have another fall or dizziness or passing out

## 2024-04-28 NOTE — ED Triage Notes (Signed)
 Patient fell out of bed around 3am d/t vertigo and hit her head on the nightstand on the way down, patient is on eliquis , did not lose consciousness, hx of liver problems.

## 2024-04-28 NOTE — Progress Notes (Signed)
    SUBJECTIVE:   CHIEF COMPLAINT / HPI:   Fall, head injury  -Around 3am, patient awoke to go to the bathroom, sat up in bed, felt very dizzy, fell out of bed, hitting back of her head on nightstand -Head was bleeding/oozing for about 10 minutes -Also noticed bleeding on her L leg  -No LOC, no confusion -Dizziness remains, like the room is spinning  -No vision, speech, or hearing changes since fall  -Has been months since her last dose of hydral prn  -On Eliquis  for Afib   PERTINENT  PMH / PSH: Afib, CHF, HTN, CKD, Hx CABG  OBJECTIVE:   BP 121/87   Pulse 72   Ht 5' 1 (1.549 m)   Wt 121 lb 2 oz (54.9 kg)   SpO2 100%   BMI 22.89 kg/m   General: No acute distress. Resting comfortably in room. CV: Normal S1/S2. No extra heart sounds. Warm and well-perfused. Pulm: Breathing comfortably on room air. CTAB. No increased WOB. Abd: Soft, non-tender, non-distended. Skin:  Warm, dry. Erythematous abrasion with ~2cm soft swelling of L superior-lateral region of head. Scattered superficial abrasion on L lower leg, no active bleeding or drainage.  Psych: Pleasant and appropriate.  Neuro: Alert and oriented. Normal strength and sensation of upper and lower extremities. Ambulates with walker independently.  CN2: no vision changes CN3,4,6: PERRLA. Patient with some difficulty tracking.  CN5: Sensation intact BL CN7: Facial expressions symmetric CN8: Hearing intact BL CN9: palate symmetric  CN10: regular speech CN11: turns head against resistance CN12: tongue midline   ASSESSMENT/PLAN:   Assessment & Plan Fall, initial encounter Given head injury and chronic AC use, discussed importance of sending patient to the ED for head imaging and further assessment to rule out of acute cranial bleed. Patient and family amenable to plan and will use private vehicle for transportation. ED triage made aware. FMTS inpatient team made aware.  Encounter for immunization Received annual flu vaccine  today.   Damien Cassis, MD [facility]

## 2024-04-28 NOTE — Progress Notes (Signed)
 This chaplain responded to Level 2 trauma in ED. The Pt. is transitioning to CT scan at the time of the chaplain's visit. The chaplain understands from RN the family has arrived at the hospital and waiting to be called to the treatment area. This chaplain will update the on-call chaplain for F/U care as needed.  Chaplain Leeroy Hummer [phone]

## 2024-04-28 NOTE — Patient Instructions (Signed)
 Thank you for visiting clinic today and allowing us  to participate in your care!  We are worried about you after your fall and sending you to the ED for imaging and further workup.   Reach out any time with any questions or concerns you may have - we are here for you!  Damien Cassis, MD [facility] [phone]

## 2024-05-10 NOTE — Progress Notes (Signed)
   SUBJECTIVE:   CHIEF COMPLAINT / HPI:   ER FOLLOW UP Hospital/facility: [facility] 10/3 Diagnosis: fall. Felt dizzy, hit head and L shoulder Procedures/tests:  - CT Head/Cspine NAICA - shoulder, tib/fib XR negative - orthostatics negative Consultants: none New medications: meclizine  prn Discharge instructions:   Status: better - had vertigo and lost balance and fell. Hasn't had to take meclizine .  Hypertension, PAF, tachy-brady syndrome, PAH, HFpEF, CAD, long QT interval: - follows with Cardiology  - Medications: amiodarone , eliquis , norvasc , NTG prn, pravastatin , fish oil - Compliance: good  Elevated LFTs - previously thought 2/2 drug-induced liver injury vs congestion related from CHF exacerbation. Recheck in ED now normalized.  Osteoporosis - started alendronate  at last visit, tolerating well.  Back pain - known DDD with progression on July imaging, hip OA. Stable appearance of R IM nail on XR 01/2024. Still with ongoing pain that is inhibiting daily life. Ambulates with walker.  Hypothyroidism - on synthroid . Recent labs 01/2024 wnl.   Xanax  at bedtime for sleep. Doesn't feel groggy in the morning.   OBJECTIVE:   BP 128/80   Pulse 70   Ht 5' 1 (1.549 m)   Wt 120 lb (54.4 kg)   SpO2 96%   BMI 22.67 kg/m   Gen: well appearing, in NAD Card: Reg rate, irregular rhythm Lungs: CTAB Ext: WWP, no edema. Well healing abrasions to lower legs.  ASSESSMENT/PLAN:   Atrial fibrillation (HCC) Rate controlled. Continue current meds, continue to follow with Cardiology.  Chronic diastolic CHF (congestive heart failure) (HCC) Euvolemic.  Hypothyroidism Reviewed prior labs and at goal, no changes.  Osteoporosis Tolerating alendronate , continue. Plan for repeat DEXA in a few years.  Hypertensive kidney disease with CKD stage III (HCC) BP at goal. Orthostatics negative in ED. Reviewed recent labs.  No changes to regimen.   Transaminitis Resolved. Likely 2/2 previous  vascular congestion during CHF exacerbation.   Restless leg syndrome Improved. No changes.  BACK PAIN, LOW Reviewed prior imaging. Not a surgical candidate. Previously in PT, no help. Will trial low dose tramadol  given impact on quality of life. Caution to avoid with xanax .   F/u 6 months, sooner if tramadol  no help.  Donald CHRISTELLA Lai, DO

## 2024-05-12 ENCOUNTER — Ambulatory Visit (INDEPENDENT_AMBULATORY_CARE_PROVIDER_SITE_OTHER): Admitting: Family Medicine

## 2024-05-12 ENCOUNTER — Encounter: Payer: Self-pay | Admitting: Family Medicine

## 2024-05-12 VITALS — BP 128/80 | HR 70 | Ht 61.0 in | Wt 120.0 lb

## 2024-05-12 DIAGNOSIS — I129 Hypertensive chronic kidney disease with stage 1 through stage 4 chronic kidney disease, or unspecified chronic kidney disease: Secondary | ICD-10-CM | POA: Diagnosis not present

## 2024-05-12 DIAGNOSIS — N1831 Chronic kidney disease, stage 3a: Secondary | ICD-10-CM

## 2024-05-12 DIAGNOSIS — G2581 Restless legs syndrome: Secondary | ICD-10-CM

## 2024-05-12 DIAGNOSIS — M810 Age-related osteoporosis without current pathological fracture: Secondary | ICD-10-CM | POA: Diagnosis not present

## 2024-05-12 DIAGNOSIS — M545 Low back pain, unspecified: Secondary | ICD-10-CM | POA: Diagnosis not present

## 2024-05-12 DIAGNOSIS — E039 Hypothyroidism, unspecified: Secondary | ICD-10-CM | POA: Diagnosis not present

## 2024-05-12 DIAGNOSIS — R7401 Elevation of levels of liver transaminase levels: Secondary | ICD-10-CM | POA: Diagnosis not present

## 2024-05-12 DIAGNOSIS — I4891 Unspecified atrial fibrillation: Secondary | ICD-10-CM

## 2024-05-12 DIAGNOSIS — I5032 Chronic diastolic (congestive) heart failure: Secondary | ICD-10-CM | POA: Diagnosis not present

## 2024-05-12 DIAGNOSIS — G8929 Other chronic pain: Secondary | ICD-10-CM

## 2024-05-12 MED ORDER — TRAMADOL HCL 50 MG PO TABS: 50.0000 mg | ORAL_TABLET | Freq: Two times a day (BID) | ORAL | 0 refills | Status: AC | PRN

## 2024-05-12 NOTE — Assessment & Plan Note (Signed)
 Tolerating alendronate , continue. Plan for repeat DEXA in a few years.

## 2024-05-12 NOTE — Assessment & Plan Note (Signed)
 Rate controlled. Continue current meds, continue to follow with Cardiology.

## 2024-05-12 NOTE — Assessment & Plan Note (Signed)
 Resolved. Likely 2/2 previous vascular congestion during CHF exacerbation.

## 2024-05-12 NOTE — Patient Instructions (Addendum)
 It was great to see you!  Our plans for today:  - Call the pharmacy about refilling your alendronate . This is for osteoporosis or weak bones.  - Try the tramadol  for your back pain.  - No other changes to your medications.   Take care and seek immediate care sooner if you develop any concerns.   Dr. Taygen Newsome

## 2024-05-12 NOTE — Assessment & Plan Note (Signed)
Improved.  No changes.

## 2024-05-12 NOTE — Assessment & Plan Note (Addendum)
 Reviewed prior imaging. Not a surgical candidate. Previously in PT, no help. Will trial low dose tramadol  given impact on quality of life. Caution to avoid with xanax .

## 2024-05-12 NOTE — Assessment & Plan Note (Signed)
 BP at goal. Orthostatics negative in ED. Reviewed recent labs.  No changes to regimen.

## 2024-05-12 NOTE — Assessment & Plan Note (Signed)
 Euvolemic.

## 2024-05-12 NOTE — Assessment & Plan Note (Signed)
 Reviewed prior labs and at goal, no changes.

## 2024-05-20 ENCOUNTER — Other Ambulatory Visit: Payer: Self-pay | Admitting: Family Medicine

## 2024-05-22 ENCOUNTER — Other Ambulatory Visit: Payer: Self-pay | Admitting: Family Medicine

## 2024-05-25 ENCOUNTER — Other Ambulatory Visit: Payer: Self-pay | Admitting: Family Medicine

## 2024-05-25 ENCOUNTER — Other Ambulatory Visit: Payer: Self-pay | Admitting: Cardiovascular Disease

## 2024-05-25 DIAGNOSIS — F4329 Adjustment disorder with other symptoms: Secondary | ICD-10-CM

## 2024-05-25 DIAGNOSIS — E039 Hypothyroidism, unspecified: Secondary | ICD-10-CM

## 2024-05-25 DIAGNOSIS — I480 Paroxysmal atrial fibrillation: Secondary | ICD-10-CM

## 2024-05-26 ENCOUNTER — Telehealth: Payer: Self-pay

## 2024-05-26 NOTE — Telephone Encounter (Signed)
 Prescription refill request for Eliquis  received. Indication:  A-Fib Last office visit:  12/31/2023 Scr:   1.65  last labs on 04/28/2024 Age:  89 yrs. Weight: 54.4 lbs.  Pt meets protocol for dose adjustment, but pt is already on the lowest dose. Pt's medication was sent to pt's pharmacy for 6 mos.

## 2024-05-26 NOTE — Telephone Encounter (Signed)
 Patient's son calls nurse line regarding questions with Requip  prescription.   He reports that patient has been taking 0.25 mg tablets BID and 1 mg tablet before bed.   However, he reports that 0.25 mg dose bottle still has directions for 0.25 mg tablet in the AM, PM and two tablets at bedtime.   He wanted to verify that she was not supposed to be taking two additional 0.25 mg tablets in the evening in addition to the 1 mg.   Per chart review, it appears that 1 mg took the place of the 2, 0.25 mg tablets, however, will reach out to PCP to clarify.   If patient is to only be taking 0.25 mg BID, son is requesting that current rx be cancelled as he has a surplus amount of the 0.25 mg dosage and for new rx to be sent with updated directions.   Chiquita JAYSON English, RN

## 2024-05-29 MED ORDER — ROPINIROLE HCL 0.25 MG PO TABS: ORAL_TABLET | ORAL | 3 refills | Status: AC

## 2024-05-29 NOTE — Telephone Encounter (Signed)
 I have sent in an updated Rx to note 0.25 mg BID.  Please call and cancel the other Rx with the nighttime additional dose.

## 2024-05-30 NOTE — Telephone Encounter (Signed)
 Called and cancelled previous rx.   Only rx on file for 0.25 mg of Requip  is BID dosing.   Called son and provided with update.   Chiquita JAYSON English, RN

## 2024-06-21 ENCOUNTER — Other Ambulatory Visit: Payer: Self-pay | Admitting: Family Medicine

## 2024-06-21 DIAGNOSIS — G609 Hereditary and idiopathic neuropathy, unspecified: Secondary | ICD-10-CM

## 2024-08-06 ENCOUNTER — Other Ambulatory Visit: Payer: Self-pay | Admitting: Family Medicine

## 2024-08-06 DIAGNOSIS — I480 Paroxysmal atrial fibrillation: Secondary | ICD-10-CM

## 2024-08-19 ENCOUNTER — Other Ambulatory Visit: Payer: Self-pay | Admitting: Cardiovascular Disease

## 2024-08-27 ENCOUNTER — Other Ambulatory Visit: Payer: Self-pay | Admitting: Family Medicine

## 2024-08-27 DIAGNOSIS — F4329 Adjustment disorder with other symptoms: Secondary | ICD-10-CM

## 2024-10-30 ENCOUNTER — Encounter
